# Patient Record
Sex: Male | Born: 1970 | Race: Black or African American | Hispanic: No | Marital: Single | Smoking: Never smoker
Health system: Southern US, Community
[De-identification: ages and names within clinical notes are randomized; demographics above are authoritative.]

## PROBLEM LIST (undated history)

## (undated) DIAGNOSIS — I10 Essential (primary) hypertension: Secondary | ICD-10-CM

## (undated) DIAGNOSIS — R7989 Other specified abnormal findings of blood chemistry: Secondary | ICD-10-CM

## (undated) DIAGNOSIS — H547 Unspecified visual loss: Secondary | ICD-10-CM

## (undated) DIAGNOSIS — J45909 Unspecified asthma, uncomplicated: Secondary | ICD-10-CM

## (undated) DIAGNOSIS — H548 Legal blindness, as defined in USA: Secondary | ICD-10-CM

## (undated) DIAGNOSIS — N289 Disorder of kidney and ureter, unspecified: Secondary | ICD-10-CM

## (undated) DIAGNOSIS — R778 Other specified abnormalities of plasma proteins: Secondary | ICD-10-CM

## (undated) DIAGNOSIS — E119 Type 2 diabetes mellitus without complications: Secondary | ICD-10-CM

## (undated) HISTORY — DX: Other specified abnormal findings of blood chemistry: R79.89

## (undated) HISTORY — DX: Legal blindness, as defined in USA: H54.8

## (undated) HISTORY — DX: Other specified abnormalities of plasma proteins: R77.8

---

## 2004-04-26 ENCOUNTER — Emergency Department (HOSPITAL_COMMUNITY): Admission: EM | Admit: 2004-04-26 | Discharge: 2004-04-26 | Payer: Self-pay | Admitting: Emergency Medicine

## 2004-05-17 ENCOUNTER — Ambulatory Visit: Payer: Self-pay | Admitting: Internal Medicine

## 2004-05-17 ENCOUNTER — Ambulatory Visit: Payer: Self-pay | Admitting: Family Medicine

## 2004-07-01 ENCOUNTER — Ambulatory Visit: Payer: Self-pay | Admitting: *Deleted

## 2005-08-01 ENCOUNTER — Ambulatory Visit: Payer: Self-pay | Admitting: Family Medicine

## 2005-08-08 ENCOUNTER — Ambulatory Visit: Payer: Self-pay | Admitting: Family Medicine

## 2006-03-27 ENCOUNTER — Ambulatory Visit: Payer: Self-pay | Admitting: Family Medicine

## 2006-09-05 ENCOUNTER — Ambulatory Visit: Payer: Self-pay | Admitting: *Deleted

## 2007-03-13 ENCOUNTER — Encounter (INDEPENDENT_AMBULATORY_CARE_PROVIDER_SITE_OTHER): Payer: Self-pay | Admitting: *Deleted

## 2007-03-15 ENCOUNTER — Encounter (INDEPENDENT_AMBULATORY_CARE_PROVIDER_SITE_OTHER): Payer: Self-pay | Admitting: Nurse Practitioner

## 2007-06-11 ENCOUNTER — Ambulatory Visit: Payer: Self-pay | Admitting: Family Medicine

## 2007-06-11 DIAGNOSIS — I110 Hypertensive heart disease with heart failure: Secondary | ICD-10-CM | POA: Insufficient documentation

## 2007-06-11 DIAGNOSIS — E119 Type 2 diabetes mellitus without complications: Secondary | ICD-10-CM | POA: Insufficient documentation

## 2007-06-11 LAB — CONVERTED CEMR LAB
ALT: 27 units/L (ref 0–53)
AST: 16 units/L (ref 0–37)
Albumin: 4.2 g/dL (ref 3.5–5.2)
Basophils Absolute: 0 10*3/uL (ref 0.0–0.1)
Basophils Relative: 0 % (ref 0–1)
Blood Glucose, Fingerstick: 419
Calcium: 9.1 mg/dL (ref 8.4–10.5)
Chloride: 98 meq/L (ref 96–112)
MCHC: 33.4 g/dL (ref 30.0–36.0)
Neutro Abs: 4.2 10*3/uL (ref 1.7–7.7)
Neutrophils Relative %: 61 % (ref 43–77)
Potassium: 4.5 meq/L (ref 3.5–5.3)
RBC: 5.25 M/uL (ref 4.22–5.81)
RDW: 12.7 % (ref 11.5–15.5)
TSH: 1.248 microintl units/mL (ref 0.350–5.50)
Total Protein: 7.4 g/dL (ref 6.0–8.3)

## 2007-06-14 DIAGNOSIS — E785 Hyperlipidemia, unspecified: Secondary | ICD-10-CM | POA: Insufficient documentation

## 2008-12-02 ENCOUNTER — Telehealth (INDEPENDENT_AMBULATORY_CARE_PROVIDER_SITE_OTHER): Payer: Self-pay | Admitting: Internal Medicine

## 2008-12-17 ENCOUNTER — Encounter (INDEPENDENT_AMBULATORY_CARE_PROVIDER_SITE_OTHER): Payer: Self-pay | Admitting: *Deleted

## 2014-09-19 ENCOUNTER — Encounter (HOSPITAL_COMMUNITY): Payer: Self-pay | Admitting: Emergency Medicine

## 2014-09-19 ENCOUNTER — Emergency Department (HOSPITAL_COMMUNITY)
Admission: EM | Admit: 2014-09-19 | Discharge: 2014-09-20 | Disposition: A | Discharge status: Home / Self Care | Payer: Self-pay | Attending: Emergency Medicine | Admitting: Emergency Medicine

## 2014-09-19 DIAGNOSIS — R739 Hyperglycemia, unspecified: Secondary | ICD-10-CM | POA: Insufficient documentation

## 2014-09-19 DIAGNOSIS — L02214 Cutaneous abscess of groin: Secondary | ICD-10-CM | POA: Insufficient documentation

## 2014-09-19 LAB — BASIC METABOLIC PANEL
ANION GAP: 9 (ref 5–15)
BUN: 9 mg/dL (ref 6–23)
CALCIUM: 9 mg/dL (ref 8.4–10.5)
CHLORIDE: 95 mmol/L — AB (ref 96–112)
CO2: 29 mmol/L (ref 19–32)
Creatinine, Ser: 1.38 mg/dL — ABNORMAL HIGH (ref 0.50–1.35)
GFR calc non Af Amer: 61 mL/min — ABNORMAL LOW (ref >90)
GFR, EST AFRICAN AMERICAN: 71 mL/min — AB (ref >90)
GLUCOSE: 412 mg/dL — AB (ref 70–99)
POTASSIUM: 4 mmol/L (ref 3.5–5.1)
SODIUM: 133 mmol/L — AB (ref 135–145)

## 2014-09-19 LAB — CBC WITH DIFFERENTIAL/PLATELET
Basophils Absolute: 0 10*3/uL (ref 0.0–0.1)
Basophils Relative: 0 % (ref 0–1)
Eosinophils Absolute: 0.1 10*3/uL (ref 0.0–0.7)
Eosinophils Relative: 1 % (ref 0–5)
HEMATOCRIT: 37 % — AB (ref 39.0–52.0)
HEMOGLOBIN: 13.4 g/dL (ref 13.0–17.0)
LYMPHS ABS: 2.3 10*3/uL (ref 0.7–4.0)
LYMPHS PCT: 26 % (ref 12–46)
MCH: 31.6 pg (ref 26.0–34.0)
MCHC: 36.2 g/dL — AB (ref 30.0–36.0)
MCV: 87.3 fL (ref 78.0–100.0)
MONOS PCT: 9 % (ref 3–12)
Monocytes Absolute: 0.8 10*3/uL (ref 0.1–1.0)
NEUTROS ABS: 5.7 10*3/uL (ref 1.7–7.7)
Neutrophils Relative %: 64 % (ref 43–77)
Platelets: 181 10*3/uL (ref 150–400)
RBC: 4.24 MIL/uL (ref 4.22–5.81)
RDW: 12.4 % (ref 11.5–15.5)
WBC: 8.9 10*3/uL (ref 4.0–10.5)

## 2014-09-19 MED ORDER — METFORMIN HCL 500 MG PO TABS
500.0000 mg | ORAL_TABLET | Freq: Once | ORAL | Status: AC
  Administered 2014-09-20: 500 mg via ORAL
  Filled 2014-09-19: qty 1

## 2014-09-19 MED ORDER — LIDOCAINE-EPINEPHRINE (PF) 2 %-1:200000 IJ SOLN
10.0000 mL | Freq: Once | INTRAMUSCULAR | Status: AC
  Administered 2014-09-20: 10 mL
  Filled 2014-09-19: qty 10

## 2014-09-19 MED ORDER — HYDROCODONE-ACETAMINOPHEN 5-325 MG PO TABS
2.0000 | ORAL_TABLET | Freq: Once | ORAL | Status: AC
Start: 2014-09-19 — End: 2014-09-19
  Administered 2014-09-19: 2 via ORAL
  Filled 2014-09-19: qty 2

## 2014-09-19 MED ORDER — SODIUM CHLORIDE 0.9 % IV BOLUS (SEPSIS)
1000.0000 mL | Freq: Once | INTRAVENOUS | Status: AC
  Administered 2014-09-20: 1000 mL via INTRAVENOUS

## 2014-09-19 NOTE — ED Notes (Signed)
Pt c/o left lower groin abscess. No hx abscesses. Denies fevers at home. Denies N/V/D. Denies drainage from sight. No other c/c.

## 2014-09-19 NOTE — ED Notes (Signed)
Pt reported having abscess to groin area.

## 2014-09-19 NOTE — ED Provider Notes (Signed)
CSN: [id number]     Arrival date & time 09/19/14  2224 History   First MD Initiated Contact with Patient 09/19/14 2240     Chief Complaint  Patient presents with  . Abscess     (Consider location/radiation/quality/duration/timing/severity/associated sxs/prior Treatment) HPI Comments: Patient presents today with a chief complaint of an abscess of the left groin area.  Abscess has been present for the past 4 days and is gradually increasing in size.  He reports that he has tried mashing the area, but has not noticed any drainage.  He denies fever, chills, nausea, or vomiting.  He denies prior history of Abscesses.  He denies history of DM.  However, review of the chart shows that the patient was diagnosed with DM in 2008 and started on Metformin at that time.  When patient was asked about this he states that he never took the Metformin and is currently not taking any medications for DM.  The history is provided by the patient.    History reviewed. No pertinent past medical history. History reviewed. No pertinent past surgical history. History reviewed. No pertinent family history. History  Substance Use Topics  . Smoking status: Never Smoker   . Smokeless tobacco: Not on file  . Alcohol Use: No    Review of Systems  All other systems reviewed and are negative.     Allergies  Review of patient's allergies indicates no known allergies.  Home Medications   Prior to Admission medications   Not on File   BP 161/88 mmHg  Pulse 109  Temp(Src) 100 F (37.8 C) (Oral)  Resp 17  SpO2 100% Physical Exam  Constitutional: He appears well-developed and well-nourished.  HENT:  Head: Normocephalic and atraumatic.  Mouth/Throat: Oropharynx is clear and moist.  Neck: Normal range of motion. Neck supple.  Cardiovascular: Normal rate, regular rhythm and normal heart sounds.   Pulmonary/Chest: Effort normal and breath sounds normal.  Abdominal: Soft. Bowel sounds are normal. He exhibits  no distension and no mass. There is no tenderness. There is no rebound and no guarding.  Genitourinary: Right testis shows no mass, no swelling and no tenderness. Left testis shows no mass, no swelling and no tenderness.  Musculoskeletal: Normal range of motion.  Neurological: He is alert.  Skin: Skin is warm and dry.     Psychiatric: He has a normal mood and affect.  Nursing note and vitals reviewed.   ED Course  Procedures (including critical care time) Labs Review Labs Reviewed - No data to display  Imaging Review No results found.   EKG Interpretation None     INCISION AND DRAINAGE Performed by: Hyman Bible Consent: Verbal consent obtained. Risks and benefits: risks, benefits and alternatives were discussed Type: abscess  Body area: left grion  Anesthesia: local infiltration  Incision was made with a scalpel.  Local anesthetic: lidocaine 2% with epinephrine  Anesthetic total: 3 ml  Complexity: complex Blunt dissection to break up loculations  Drainage: purulent  Drainage amount: large  Patient tolerance: Patient tolerated the procedure well with no immediate complications.    MDM   Final diagnoses:  None   Patient presents today with an abscess of the left groin area x 4 days.  Abscess incised and drained in the ED with good results.  He is afebrile in the ED.  Labs unremarkable aside from hyperglycemia.  Patient currently not on any DM medications.  Anion gap of 9.  Bicarb of 29.  Therefore, do not feel that the  patient is in DKA.  Patient given IVF and started on Metformin.  Patient also started on antibiotics.  Feel that he is stable for discharge.  Instructed to follow up in 2 days for recheck.  Return precautions given.    Hyman Bible, PA-C 09/20/14 0133  Evelina Bucy, MD 09/21/14 [phone]

## 2014-09-20 MED ORDER — SULFAMETHOXAZOLE-TRIMETHOPRIM 800-160 MG PO TABS
2.0000 | ORAL_TABLET | Freq: Two times a day (BID) | ORAL | Status: DC
Start: 2014-09-20 — End: 2016-01-04

## 2014-09-20 MED ORDER — METFORMIN HCL 500 MG PO TABS: 500.0000 mg | ORAL_TABLET | Freq: Two times a day (BID) | ORAL | Status: DC

## 2014-09-20 NOTE — ED Notes (Signed)
Applied a dry dsg to groin area. Pt tolerated well.

## 2014-09-20 NOTE — Discharge Instructions (Signed)
Your blood work today showed diabetes.  It is very important for you to take the Metformin and to eat a diet low in sugar and carbohydrates.  It is also important for you to follow up with the Primary Care Physician listed above at [facility] or see resource guide below.  It is also recommended to follow up with a Primary Care Physician or Urgent Care in 2 days to have the abscess rechecked.     Emergency Department Resource Guide 1) Find a Doctor and Pay Out of Pocket Although you won't have to find out who is covered by your insurance plan, it is a good idea to ask around and get recommendations. You will then need to call the office and see if the doctor you have chosen will accept you as a new patient and what types of options they offer for patients who are self-pay. Some doctors offer discounts or will set up payment plans for their patients who do not have insurance, but you will need to ask so you aren't surprised when you get to your appointment.  2) Contact Your Local Health Department Not all health departments have doctors that can see patients for sick visits, but many do, so it is worth a call to see if yours does. If you don't know where your local health department is, you can check in your phone book. The CDC also has a tool to help you locate your state's health department, and many state websites also have listings of all of their local health departments.  3) Find a [facility] If your illness is not likely to be very severe or complicated, you may want to try a walk in clinic. These are popping up all over the country in pharmacies, drugstores, and shopping centers. They're usually staffed by nurse practitioners or physician assistants that have been trained to treat common illnesses and complaints. They're usually fairly quick and inexpensive. However, if you have serious medical issues or chronic medical problems, these are probably not your best option.  No  Primary Care Doctor: - Call Health Connect at  [phone] - they can help you locate a primary care doctor that  accepts your insurance, provides certain services, etc. - Physician Referral Service- [phone]  Chronic Pain Problems: Organization         Address  Phone   Notes  [facility]  [phone] Patients need to be referred by their primary care doctor.   Medication Assistance: Organization         Address  Phone   Notes  [facility] Medication [facility] [address] [place], [place] [address] [phone] --Must be a resident of [facility] -- Must have NO insurance coverage whatsoever (no Medicaid/ Medicare, etc.) -- The pt. MUST have a primary care doctor that directs their care regularly and follows them in the community   MedAssist  [phone]   [employer]  [phone]    Agencies that provide inexpensive medical care: Organization         Address  Phone   Notes  [place]  [phone]   [facility]    [phone]   [facility] [place], [place] [phone]   [facility] [address], [place] [phone]   Planned Parenthood    [phone]   [facility]    [phone]   [address], [place] Phone:  [phone], Fax:  [phone] Hours of Operation:  9 am - 6 pm, M-F.  Also accepts Medicaid/Medicare and self-pay.  [facility] for [place], [address], [place] Phone: [phone], Fax: [phone]. Hours of Operation:  8:30 am - 5:30 pm, M-F.  Also accepts Medicaid and self-pay.  [facility] [address], [place] Phone: [phone]   [place], [place], [place] [phone], Ext. [phone] Mondays & Thursdays: 7-9 AM.  First 15 patients are seen on  a first come, first serve basis.    [place] Providers:  Organization         Address  Phone   Notes  [facility] [address], [place] [phone] Also accepts self-pay patients.  [facility] [id number] [place], [place]  [phone]   [place], [address], [place] [phone]   [facility] [address] [place] [phone]   Lucianne Lei [address], [place]   [phone] Only accepts [place] Access [place] patients after they have their name applied to their card.   Self-Pay (no insurance) in [facility]:  Organization         Address  Phone   Notes  Sickle Cell Patients, [facility] [place] [phone]   [facility] [place] [phone]   [facility] [place]  [place], [place], [place] [phone]   [facility]/Dr. Osei-Bonsu  [address] [place] or [address], [place] [phone] Phone number for both [place] and [place] locations is the same.  Urgent Medical and [facility] [address] [place] [phone]   [facility] [address], [place] or [address] [phone]   [facility] [address] [place] [phone], phone; [phone], fax Sees patients 1st and 3rd Saturday of every month.  Must not qualify for public or private insurance (i.e. Medicaid, Medicare, [place] Health Choice, Veterans' Benefits)  Household income should be no more than 200% of the poverty level The clinic cannot treat you if you are pregnant or think you are pregnant  Sexually transmitted diseases are not treated at the clinic.    Dental Care: Organization          Address  Phone  Notes  [facility] [place] [phone] Accepts children up to age 77 who are enrolled in [place] or [place]; pregnant women with a Medicaid card; and children who have applied for Medicaid or [place] Health Choice, but were declined, whose parents can pay a reduced fee at time of service.  [facility] Department of [facility]  [address], [place] [phone] Accepts children up to age 73 who are enrolled in [place] or [place]; pregnant women with a Medicaid card; and children who have applied for Medicaid or [place] Health Choice, but were declined, whose parents can pay a reduced fee at time of service.  [place]  Access PROGRAM  [place] [phone] Patients are seen by appointment only. Walk-ins are not accepted. [place] will see patients 51 years of age and older. Monday - Tuesday (8am-5pm) Most Wednesdays (8:30-5pm) $30 per visit, cash only  [facility] Adult Dental Access PROGRAM  [address], [facility] [phone] Patients are seen by appointment only. Walk-ins are not accepted. [place] will see patients 67 years of age and older. One Wednesday Evening (Monthly: Volunteer Based).  $30 per visit, cash only  [place]  [phone] for adults; Children under age 59, call Graduate Pediatric Dentistry at [phone]. Children aged 43-14, please call [phone] to request a pediatric application.  Dental services are provided in all areas of dental care including fillings, crowns and bridges, complete and partial dentures, implants, gum treatment, root canals, and extractions. Preventive care is also provided. Treatment is provided to both adults and children. Patients are selected via a lottery and there is often a waiting list.   [facility] [address] [place]  [phone] [email or web address]   [facility] [address] [place], [place] [phone], Ext. [phone] Second and Fourth Thursday of each month, opens at 6:30 AM; Clinic ends at 9 AM.  Patients are seen on a first-come first-served basis, and a limited number are seen during each clinic.   [facility]  [address] Hillard Danker [place], [place] [phone]   Eligibility Requirements You must have lived in [place], [place], or [place] counties for at least the last three months.   You cannot be eligible for state or federal sponsored [employer], including [employer], [place], or [employer].   You generally cannot be eligible for healthcare insurance through your employer.    How to apply: Eligibility screenings are held every Tuesday and Wednesday afternoon from 1:00 pm until 4:00 pm. You do not need an appointment for the interview!  [facility] [address], [place], [place]   [place]  [place] Department  [place]  [phone]    Behavioral Health Resources in the Community: Intensive Outpatient Programs Organization         Address  Phone  Notes  [place]. [address], [place], [place] [phone]   [facility]patient [address] [place], [place]   ADS: Alcohol & Drug Svcs [address] [place], [place]   [place] [address],  [place], [place] or [phone]   Substance Abuse Resources Organization         Address  Phone  Notes  Alcohol and Drug Services  [phone]   [place]  [phone]   [facility] [place]   Chinita Pester  [phone]   Residential & Outpatient Substance Abuse Program  [phone]   Psychological  Services Organization         Address  Phone  Notes  [facility] [place]  [place]  [phone]   [place] [address] [place] or [phone]    Mobile Crisis Teams Organization         Address  Phone  Notes  [facility], Mobile Crisis Care Unit  [phone]   [facility]  [address] Lady [place],  [place] [address], [place] [phone]    Self-Help/Support Groups Organization         Address  Phone             Notes  [facility] of [place] - variety of support groups  [place] Call for more information  Narcotics Anonymous (NA), [facility] [address], [employer] [place]  2 meetings at this location   Special educational needs teacher         Address  Phone  Notes  [facility] Residential Treatment [place],    [place]  [phone]   [facility]  [address], [place] [phone], [place], [place]   [place], [place] [phone] Admissions: 8am-3pm M-F  Incentives Substance [place] [address],    [place], [place] [phone]   [facility] [address] [place], [place], [place]   [facility] [address],  [place], [place]   [facility] Programs - Intensive Outpatient [address] Kristeen Mans [address], [place], [place]   [facility] [address].,  [place], [place] [phone] or [phone]   [facility] Services (RTS) [address], [place], [place] Accepts Medicaid  [facility] [place] [address]  [place] [phone] Substance Abuse/Addiction Treatment   [facility] Organization         Address  Phone  Notes  [facility]  [phone]   Domenic Schwab, PhD [address] Arlis Porta [place], [place]   [phone] or [phone]   [facility], [place] [phone]   [facility] [address], [place], [place] [phone] Insurance/Medicaid/sponsorship through [facility] and Families [address] [place]                                    [place], [place] [phone] [place] [address] [place] [address] [place], [place] [phone]    Dr. Adele Schilder  [phone]   [facility] Dept. 1) [address] [place] 2) [place] 3)  [place] [address], [place] [phone]  [phone]   [place] [phone] or [phone] (After Hours)

## 2014-09-20 NOTE — ED Notes (Signed)
PA at bedside to I&D abscess to groin area. Pt tolerated well.

## 2014-09-20 NOTE — ED Notes (Signed)
Reapplied dry dsg. Noted moderate amt of bleeding from site. Pt tolerated well.

## 2014-09-20 NOTE — ED Notes (Signed)
Awake. Verbally responsive. A/O x4. Resp even and unlabored. No audible adventitious breath sounds noted. ABC's intact.  

## 2016-01-04 ENCOUNTER — Encounter (HOSPITAL_COMMUNITY): Payer: Self-pay | Admitting: Family Medicine

## 2016-01-04 ENCOUNTER — Ambulatory Visit (HOSPITAL_COMMUNITY)
Admission: EM | Admit: 2016-01-04 | Discharge: 2016-01-04 | Disposition: A | Discharge status: Home / Self Care | Payer: 59 | Attending: Family Medicine | Admitting: Family Medicine

## 2016-01-04 DIAGNOSIS — J9801 Acute bronchospasm: Secondary | ICD-10-CM

## 2016-01-04 DIAGNOSIS — I10 Essential (primary) hypertension: Secondary | ICD-10-CM

## 2016-01-04 MED ORDER — PREDNISONE 20 MG PO TABS: ORAL_TABLET | ORAL | Status: DC

## 2016-01-04 MED ORDER — ALBUTEROL SULFATE HFA 108 (90 BASE) MCG/ACT IN AERS: 2.0000 | INHALATION_SPRAY | RESPIRATORY_TRACT | Status: DC | PRN

## 2016-01-04 NOTE — ED Notes (Signed)
Pt here for cough, wheezing x 4 days. sts some phlegm. Pt hypertensive(checked twice) at 181/110. sts hasn't been taking BP meds. No acute distress. sats 92%

## 2016-01-04 NOTE — Discharge Instructions (Signed)
Bronchospasm, Adult A bronchospasm is a spasm or tightening of the airways going into the lungs. During a bronchospasm breathing becomes more difficult because the airways get smaller. When this happens there can be coughing, a whistling sound when breathing (wheezing), and difficulty breathing. Bronchospasm is often associated with asthma, but not all patients who experience a bronchospasm have asthma. CAUSES  A bronchospasm is caused by inflammation or irritation of the airways. The inflammation or irritation may be triggered by:   Allergies (such as to animals, pollen, food, or mold). Allergens that cause bronchospasm may cause wheezing immediately after exposure or many hours later.   Infection. Viral infections are believed to be the most common cause of bronchospasm.   Exercise.   Irritants (such as pollution, cigarette smoke, strong odors, aerosol sprays, and paint fumes).   Weather changes. Winds increase molds and pollens in the air. Rain refreshes the air by washing irritants out. Cold air may cause inflammation.   Stress and emotional upset.  SIGNS AND SYMPTOMS   Wheezing.   Excessive nighttime coughing.   Frequent or severe coughing with a simple cold.   Chest tightness.   Shortness of breath.  DIAGNOSIS  Bronchospasm is usually diagnosed through a history and physical exam. Tests, such as chest X-rays, are sometimes done to look for other conditions. TREATMENT   Inhaled medicines can be given to open up your airways and help you breathe. The medicines can be given using either an inhaler or a nebulizer machine.  Corticosteroid medicines may be given for severe bronchospasm, usually when it is associated with asthma. HOME CARE INSTRUCTIONS   Always have a plan prepared for seeking medical care. Know when to call your health care provider and local emergency services (911 in the [place] Know where you can access local emergency care.  Only take medicines as  directed by your health care provider.  If you were prescribed an inhaler or nebulizer machine, ask your health care provider to explain how to use it correctly. Always use a spacer with your inhaler if you were given one.  It is necessary to remain calm during an attack. Try to relax and breathe more slowly.  Control your home environment in the following ways:   Change your heating and air conditioning filter at least once a month.   Limit your use of fireplaces and wood stoves.  Do not smoke and do not allow smoking in your home.   Avoid exposure to perfumes and fragrances.   Get rid of pests (such as roaches and mice) and their droppings.   Throw away plants if you see mold on them.   Keep your house clean and dust free.   Replace carpet with wood, tile, or vinyl flooring. Carpet can trap dander and dust.   Use allergy-proof pillows, mattress covers, and box spring covers.   Wash bed sheets and blankets every week in hot water and dry them in a dryer.   Use blankets that are made of polyester or cotton.   Wash hands frequently. SEEK MEDICAL CARE IF:   You have muscle aches.   You have chest pain.   The sputum changes from clear or white to yellow, green, gray, or bloody.   The sputum you cough up gets thicker.   There are problems that may be related to the medicine you are given, such as a rash, itching, swelling, or trouble breathing.  SEEK IMMEDIATE MEDICAL CARE IF:   You have worsening wheezing and coughing  even after taking your prescribed medicines.   You have increased difficulty breathing.   You develop severe chest pain. MAKE SURE YOU:   Understand these instructions.  Will watch your condition.  Will get help right away if you are not doing well or get worse.   This information is not intended to replace advice given to you by your health care provider. Make sure you discuss any questions you have with your health care  provider.   Document Released: 06/15/2003 Document Revised: 07/03/2014 Document Reviewed: 12/02/2012 Elsevier Interactive Patient Education 2016 [employer].  How to Use an Inhaler Using your inhaler correctly is very important. Good technique will make sure that the medicine reaches your lungs.  HOW TO USE AN INHALER:  Take the cap off the inhaler.  If this is the first time using your inhaler, you need to prime it. Shake the inhaler for 5 seconds. Release four puffs into the air, away from your face. Ask your doctor for help if you have questions.  Shake the inhaler for 5 seconds.  Turn the inhaler so the bottle is above the mouthpiece.  Put your pointer finger on top of the bottle. Your thumb holds the bottom of the inhaler.  Open your mouth.  Either hold the inhaler away from your mouth (the width of 2 fingers) or place your lips tightly around the mouthpiece. Ask your doctor which way to use your inhaler.  Breathe out as much air as possible.  Breathe in and push down on the bottle 1 time to release the medicine. You will feel the medicine go in your mouth and throat.  Continue to take a deep breath in very slowly. Try to fill your lungs.  After you have breathed in completely, hold your breath for 10 seconds. This will help the medicine to settle in your lungs. If you cannot hold your breath for 10 seconds, hold it for as long as you can before you breathe out.  Breathe out slowly, through pursed lips. Whistling is an example of pursed lips.  If your doctor has told you to take more than 1 puff, wait at least 15-30 seconds between puffs. This will help you get the best results from your medicine. Do not use the inhaler more than your doctor tells you to.  Put the cap back on the inhaler.  Follow the directions from your doctor or from the inhaler package about cleaning the inhaler. If you use more than one inhaler, ask your doctor which inhalers to use and what order to  use them in. Ask your doctor to help you figure out when you will need to refill your inhaler.  If you use a steroid inhaler, always rinse your mouth with water after your last puff, gargle and spit out the water. Do not swallow the water. GET HELP IF:  The inhaler medicine only partially helps to stop wheezing or shortness of breath.  You are having trouble using your inhaler.  You have some increase in thick spit (phlegm). GET HELP RIGHT AWAY IF:  The inhaler medicine does not help your wheezing or shortness of breath or you have tightness in your chest.  You have dizziness, headaches, or fast heart rate.  You have chills, fever, or night sweats.  You have a large increase of thick spit, or your thick spit is bloody. MAKE SURE YOU:   Understand these instructions.  Will watch your condition.  Will get help right away if you are not  doing well or get worse.   This information is not intended to replace advice given to you by your health care provider. Make sure you discuss any questions you have with your health care provider.   Document Released: 03/21/2008 Document Revised: 04/02/2013 Document Reviewed: 01/09/2013 Elsevier Interactive Patient Education 2016 [employer].  Hypertension You must have your hypertension under control as soon as possible. Restart her medications and have them. You need to see a primary care doctor sent is possible. Hypertension is another name for high blood pressure. High blood pressure forces your heart to work harder to pump blood. A blood pressure reading has two numbers, which includes a higher number over a lower number (example: 110/72). HOME CARE   Have your blood pressure rechecked by your doctor.  Only take medicine as told by your doctor. Follow the directions carefully. The medicine does not work as well if you skip doses. Skipping doses also puts you at risk for problems.  Do not smoke.  Monitor your blood pressure at home as  told by your doctor. GET HELP IF:  You think you are having a reaction to the medicine you are taking.  You have repeat headaches or feel dizzy.  You have puffiness (swelling) in your ankles.  You have trouble with your vision. GET HELP RIGHT AWAY IF:   You get a very bad headache and are confused.  You feel weak, numb, or faint.  You get chest or belly (abdominal) pain.  You throw up (vomit).  You cannot breathe very well. MAKE SURE YOU:   Understand these instructions.  Will watch your condition.  Will get help right away if you are not doing well or get worse.   This information is not intended to replace advice given to you by your health care provider. Make sure you discuss any questions you have with your health care provider.   Document Released: 11/29/2007 Document Revised: 06/17/2013 Document Reviewed: 04/04/2013 Elsevier Interactive Patient Education 2016 [place] Your High Blood Pressure Blood pressure is a measurement of how forceful your blood is pressing against the walls of the arteries. Arteries are muscular tubes within the circulatory system. Blood pressure does not stay the same. Blood pressure rises when you are active, excited, or nervous; and it lowers during sleep and relaxation. If the numbers measuring your blood pressure stay above normal most of the time, you are at risk for health problems. High blood pressure (hypertension) is a long-term (chronic) condition in which blood pressure is elevated. A blood pressure reading is recorded as two numbers, such as 120 over 80 (or 120/80). The first, higher number is called the systolic pressure. It is a measure of the pressure in your arteries as the heart beats. The second, lower number is called the diastolic pressure. It is a measure of the pressure in your arteries as the heart relaxes between beats.  Keeping your blood pressure in a normal range is important to your overall health and  prevention of health problems, such as heart disease and stroke. When your blood pressure is uncontrolled, your heart has to work harder than normal. High blood pressure is a very common condition in adults because blood pressure tends to rise with age. Men and women are equally likely to have hypertension but at different times in life. Before age 7, men are more likely to have hypertension. After 45 years of age, women are more likely to have it. Hypertension is especially common in  African Americans. This condition often has no signs or symptoms. The cause of the condition is usually not known. Your caregiver can help you come up with a plan to keep your blood pressure in a normal, healthy range. BLOOD PRESSURE STAGES Blood pressure is classified into four stages: normal, prehypertension, stage 1, and stage 2. Your blood pressure reading will be used to determine what type of treatment, if any, is necessary. Appropriate treatment options are tied to these four stages:  Normal  Systolic pressure (mm Hg): below 120.  Diastolic pressure (mm Hg): below 80. Prehypertension  Systolic pressure (mm Hg): 120 to 139.  Diastolic pressure (mm Hg): 80 to 89. Stage1  Systolic pressure (mm Hg): 140 to 159.  Diastolic pressure (mm Hg): 90 to 99. Stage2  Systolic pressure (mm Hg): 160 or above.  Diastolic pressure (mm Hg): 100 or above. RISKS RELATED TO HIGH BLOOD PRESSURE Managing your blood pressure is an important responsibility. Uncontrolled high blood pressure can lead to:  A heart attack.  A stroke.  A weakened blood vessel (aneurysm).  Heart failure.  Kidney damage.  Eye damage.  Metabolic syndrome.  Memory and concentration problems. HOW TO MANAGE YOUR BLOOD PRESSURE Blood pressure can be managed effectively with lifestyle changes and medicines (if needed). Your caregiver will help you come up with a plan to bring your blood pressure within a normal range. Your plan should  include the following: Education  Read all information provided by your caregivers about how to control blood pressure.  Educate yourself on the latest guidelines and treatment recommendations. New research is always being done to further define the risks and treatments for high blood pressure. Lifestylechanges  Control your weight.  Avoid smoking.  Stay physically active.  Reduce the amount of salt in your diet.  Reduce stress.  Control any chronic conditions, such as high cholesterol or diabetes.  Reduce your alcohol intake. Medicines  Several medicines (antihypertensive medicines) are available, if needed, to bring blood pressure within a normal range. Communication  Review all the medicines you take with your caregiver because there may be side effects or interactions.  Talk with your caregiver about your diet, exercise habits, and other lifestyle factors that may be contributing to high blood pressure.  See your caregiver regularly. Your caregiver can help you create and adjust your plan for managing high blood pressure. RECOMMENDATIONS FOR TREATMENT AND FOLLOW-UP  The following recommendations are based on current guidelines for managing high blood pressure in nonpregnant adults. Use these recommendations to identify the proper follow-up period or treatment option based on your blood pressure reading. You can discuss these options with your caregiver.  Systolic pressure of 123456 to [id number] or diastolic pressure of 80 to 89: Follow up with your caregiver as directed.  Systolic pressure of [id number] to 0000000 or diastolic pressure of 90 to 100: Follow up with your caregiver within 2 months.  Systolic pressure above 0000000 or diastolic pressure above [id number]: Follow up with your caregiver within 1 month.  Systolic pressure above [id number] or diastolic pressure above [id number]: Consider antihypertensive therapy; follow up with your caregiver within 1 week.  Systolic pressure above [id number] or diastolic  pressure above 123456: Begin antihypertensive therapy; follow up with your caregiver within 1 week.   This information is not intended to replace advice given to you by your health care provider. Make sure you discuss any questions you have with your health care provider.   Document Released: 03/06/2012 Document Reviewed: 03/06/2012 Elsevier Interactive Patient  Education ©2016 Elsevier Inc[employer] ° °

## 2016-01-04 NOTE — ED Provider Notes (Signed)
CSN: [id number]     Arrival date & time 01/04/16  1225 History   First MD Initiated Contact with Patient 01/04/16 1328     Chief Complaint  Patient presents with  . Cough  . Wheezing   (Consider location/radiation/quality/duration/timing/severity/associated sxs/prior Treatment) HPI Comments: 45 year old male states that he is having a cough and wheeze primarily at nighttime. He rarely has symptoms during the day. Started approximate 4 days ago. He states that it is better when per home however when lying on his side or his back he developed some cough and a sensation of wheezing. He was diagnosed with asthma as a child but as he is going to adult he never had any problems. He has never been on vacation. He states this is a recent and new development. He also has a history of hypertension and is currently not taking his medication. He denies any type of chest pain, pressure, heaviness, tightness or fullness. Denies exertional symptoms.   History reviewed. No pertinent past medical history. History reviewed. No pertinent past surgical history. History reviewed. No pertinent family history. Social History  Substance Use Topics  . Smoking status: Never Smoker   . Smokeless tobacco: None  . Alcohol Use: No    Review of Systems  Constitutional: Negative for fever, activity change and fatigue.  HENT: Negative.   Respiratory: Positive for cough and wheezing. Negative for shortness of breath.   Cardiovascular: Negative for chest pain, palpitations and leg swelling.  Musculoskeletal: Negative.   Skin: Negative.   Neurological: Negative.   All other systems reviewed and are negative.   Allergies  Review of patient's allergies indicates no known allergies.  Home Medications   Prior to Admission medications   Medication Sig Start Date End Date Taking? Authorizing Provider  albuterol (PROVENTIL HFA;VENTOLIN HFA) 108 (90 Base) MCG/ACT inhaler Inhale 2 puffs into the lungs every 4 (four)  hours as needed for wheezing or shortness of breath. 01/04/16   Janne Napoleon, NP  predniSONE (DELTASONE) 20 MG tablet Take 3 tabs po on first day, 2 tabs second day, 2 tabs third day, 1 tab fourth day, 1 tab 5th day. Take with food. 01/04/16   Janne Napoleon, NP   Meds Ordered and Administered this Visit  Medications - No data to display  BP 181/110 mmHg  Pulse 102  Temp(Src) 98.8 F (37.1 C)  Resp 18  SpO2 92% No data found.   Physical Exam  Constitutional: He is oriented to person, place, and time. He appears well-developed and well-nourished. No distress.  HENT:  Mouth/Throat: Oropharynx is clear and moist. No oropharyngeal exudate.  Eyes: Conjunctivae and EOM are normal.  Neck: Normal range of motion. Neck supple.  Cardiovascular: Normal rate, regular rhythm and normal heart sounds.   Pulmonary/Chest: Effort normal. No respiratory distress. He has no rales. He exhibits no tenderness.  Tidal volume reveals no wheezing. Having the patient cough produces diffuse bilateral coarseness. Forced expiration produces mildly prolonged expiratory phase. No other abnormal or adventitious sounds.  Musculoskeletal: He exhibits no edema.  Lymphadenopathy:    He has no cervical adenopathy.  Neurological: He is alert and oriented to person, place, and time. He exhibits normal muscle tone.  Skin: Skin is warm and dry.  Psychiatric: He has a normal mood and affect.  Nursing note and vitals reviewed.   ED Course  Procedures (including critical care time)  Labs Review Labs Reviewed - No data to display  Imaging Review No results found.   Visual Acuity  Review  Right Eye Distance:   Left Eye Distance:   Bilateral Distance:    Right Eye Near:   Left Eye Near:    Bilateral Near:         MDM   1. Bronchospasm   2. Essential hypertension    You must have your hypertension under control as soon as possible. Restart your medications if you have them. You need to see a primary care doctor  sent is possible. Meds ordered this encounter  Medications  . albuterol (PROVENTIL HFA;VENTOLIN HFA) 108 (90 Base) MCG/ACT inhaler    Sig: Inhale 2 puffs into the lungs every 4 (four) hours as needed for wheezing or shortness of breath.    Dispense:  1 Inhaler    Refill:  0    Order Specific Question:  Supervising Provider    Answer:  Ihor Gully D [id number]  . predniSONE (DELTASONE) 20 MG tablet    Sig: Take 3 tabs po on first day, 2 tabs second day, 2 tabs third day, 1 tab fourth day, 1 tab 5th day. Take with food.    Dispense:  9 tablet    Refill:  0    Order Specific Question:  Supervising Provider    Answer:  Ihor Gully D [id number]  Taken any histamines such as Allegra, Claritin or Zyrtec daily.     Janne Napoleon, NP 01/04/16 1355

## 2016-12-11 ENCOUNTER — Emergency Department (HOSPITAL_COMMUNITY): Payer: Self-pay

## 2016-12-11 ENCOUNTER — Encounter (HOSPITAL_COMMUNITY): Payer: Self-pay | Admitting: Emergency Medicine

## 2016-12-11 ENCOUNTER — Inpatient Hospital Stay (HOSPITAL_COMMUNITY)
Admission: EM | Admit: 2016-12-11 | Discharge: 2016-12-14 | DRG: 305 | Disposition: A | Discharge status: Home / Self Care | Payer: 59 | Attending: Internal Medicine | Admitting: Internal Medicine

## 2016-12-11 DIAGNOSIS — Z79899 Other long term (current) drug therapy: Secondary | ICD-10-CM

## 2016-12-11 DIAGNOSIS — I160 Hypertensive urgency: Secondary | ICD-10-CM | POA: Diagnosis not present

## 2016-12-11 DIAGNOSIS — I10 Essential (primary) hypertension: Secondary | ICD-10-CM | POA: Diagnosis not present

## 2016-12-11 DIAGNOSIS — N19 Unspecified kidney failure: Secondary | ICD-10-CM

## 2016-12-11 DIAGNOSIS — N183 Chronic kidney disease, stage 3 (moderate): Secondary | ICD-10-CM | POA: Diagnosis present

## 2016-12-11 DIAGNOSIS — I161 Hypertensive emergency: Principal | ICD-10-CM | POA: Diagnosis present

## 2016-12-11 DIAGNOSIS — R609 Edema, unspecified: Secondary | ICD-10-CM

## 2016-12-11 DIAGNOSIS — E1129 Type 2 diabetes mellitus with other diabetic kidney complication: Secondary | ICD-10-CM | POA: Diagnosis not present

## 2016-12-11 DIAGNOSIS — I248 Other forms of acute ischemic heart disease: Secondary | ICD-10-CM | POA: Diagnosis present

## 2016-12-11 DIAGNOSIS — N179 Acute kidney failure, unspecified: Secondary | ICD-10-CM

## 2016-12-11 DIAGNOSIS — E8770 Fluid overload, unspecified: Secondary | ICD-10-CM

## 2016-12-11 DIAGNOSIS — E8779 Other fluid overload: Secondary | ICD-10-CM | POA: Diagnosis not present

## 2016-12-11 DIAGNOSIS — E785 Hyperlipidemia, unspecified: Secondary | ICD-10-CM | POA: Diagnosis present

## 2016-12-11 DIAGNOSIS — I129 Hypertensive chronic kidney disease with stage 1 through stage 4 chronic kidney disease, or unspecified chronic kidney disease: Secondary | ICD-10-CM | POA: Diagnosis present

## 2016-12-11 DIAGNOSIS — E1122 Type 2 diabetes mellitus with diabetic chronic kidney disease: Secondary | ICD-10-CM | POA: Diagnosis present

## 2016-12-11 DIAGNOSIS — E119 Type 2 diabetes mellitus without complications: Secondary | ICD-10-CM

## 2016-12-11 DIAGNOSIS — R809 Proteinuria, unspecified: Secondary | ICD-10-CM | POA: Diagnosis not present

## 2016-12-11 HISTORY — DX: Essential (primary) hypertension: I10

## 2016-12-11 HISTORY — DX: Type 2 diabetes mellitus without complications: E11.9

## 2016-12-11 HISTORY — DX: Hypertensive urgency: I16.0

## 2016-12-11 HISTORY — DX: Fluid overload, unspecified: E87.70

## 2016-12-11 HISTORY — DX: Acute kidney failure, unspecified: N17.9

## 2016-12-11 LAB — BRAIN NATRIURETIC PEPTIDE: B Natriuretic Peptide: 261.6 pg/mL — ABNORMAL HIGH (ref 0.0–100.0)

## 2016-12-11 LAB — URINALYSIS, MICROSCOPIC (REFLEX): SQUAMOUS EPITHELIAL / LPF: NONE SEEN

## 2016-12-11 LAB — BASIC METABOLIC PANEL
Anion gap: 8 (ref 5–15)
BUN: 22 mg/dL — ABNORMAL HIGH (ref 6–20)
CHLORIDE: 106 mmol/L (ref 101–111)
CO2: 25 mmol/L (ref 22–32)
Calcium: 8.8 mg/dL — ABNORMAL LOW (ref 8.9–10.3)
Creatinine, Ser: 2.25 mg/dL — ABNORMAL HIGH (ref 0.61–1.24)
GFR calc Af Amer: 39 mL/min — ABNORMAL LOW (ref >60)
GFR calc non Af Amer: 33 mL/min — ABNORMAL LOW (ref >60)
Glucose, Bld: 110 mg/dL — ABNORMAL HIGH (ref 65–99)
Potassium: 3.7 mmol/L (ref 3.5–5.1)
Sodium: 139 mmol/L (ref 135–145)

## 2016-12-11 LAB — CBC WITH DIFFERENTIAL/PLATELET
Basophils Absolute: 0 10*3/uL (ref 0.0–0.1)
Basophils Relative: 0 %
Eosinophils Absolute: 0.2 10*3/uL (ref 0.0–0.7)
Eosinophils Relative: 2 %
HCT: 32.9 % — ABNORMAL LOW (ref 39.0–52.0)
Hemoglobin: 11.4 g/dL — ABNORMAL LOW (ref 13.0–17.0)
LYMPHS ABS: 1.8 10*3/uL (ref 0.7–4.0)
Lymphocytes Relative: 25 %
MCH: 29.6 pg (ref 26.0–34.0)
MCHC: 34.7 g/dL (ref 30.0–36.0)
MCV: 85.5 fL (ref 78.0–100.0)
MONO ABS: 0.7 10*3/uL (ref 0.1–1.0)
Monocytes Relative: 9 %
NEUTROS ABS: 4.5 10*3/uL (ref 1.7–7.7)
Neutrophils Relative %: 64 %
PLATELETS: 245 10*3/uL (ref 150–400)
RBC: 3.85 MIL/uL — AB (ref 4.22–5.81)
RDW: 13.2 % (ref 11.5–15.5)
WBC: 7.1 10*3/uL (ref 4.0–10.5)

## 2016-12-11 LAB — URINALYSIS, ROUTINE W REFLEX MICROSCOPIC
BILIRUBIN URINE: NEGATIVE
Glucose, UA: NEGATIVE mg/dL
Ketones, ur: NEGATIVE mg/dL
Leukocytes, UA: NEGATIVE
NITRITE: NEGATIVE
PH: 6 (ref 5.0–8.0)
Protein, ur: >300 mg/dL — AB
SPECIFIC GRAVITY, URINE: 1.008 (ref 1.005–1.030)

## 2016-12-11 LAB — RAPID URINE DRUG SCREEN, HOSP PERFORMED
AMPHETAMINES: NOT DETECTED
Barbiturates: NOT DETECTED
Benzodiazepines: NOT DETECTED
Cocaine: NOT DETECTED
OPIATES: NOT DETECTED
Tetrahydrocannabinol: POSITIVE — AB

## 2016-12-11 LAB — I-STAT TROPONIN, ED: Troponin i, poc: 0.05 ng/mL (ref 0.00–0.08)

## 2016-12-11 LAB — TROPONIN I: TROPONIN I: 0.06 ng/mL — AB (ref <0.03)

## 2016-12-11 MED ORDER — AMLODIPINE BESYLATE 5 MG PO TABS
5.0000 mg | ORAL_TABLET | Freq: Once | ORAL | Status: AC
  Administered 2016-12-11: 5 mg via ORAL
  Filled 2016-12-11: qty 1

## 2016-12-11 MED ORDER — LABETALOL HCL 5 MG/ML IV SOLN
10.0000 mg | Freq: Once | INTRAVENOUS | Status: DC
  Administered 2016-12-11: 10 mg via INTRAVENOUS
  Filled 2016-12-11: qty 4

## 2016-12-11 NOTE — ED Provider Notes (Signed)
Emergency Department Provider Note   I have reviewed the triage vital signs and the nursing notes.   HISTORY  Chief Complaint Leg Swelling   HPI Corey Park is a 46 y.o. male with PMH of HLD, HTN, and DM with no PCP and non-compliant with medication presents to the emergency department for evaluation of worsening bilateral lower extremity swelling and some fatigue symptoms. Patient has not taken any blood pressure or "fluid pills" in the last several months. He states he was prescribed this in the past but never filled the prescription because the symptoms seemed to get better. He started working recently has been standing a lot and feels that his lower extremity swelling is worse. Having swelling on both sides the left slightly greater than the right. Denies any dyspnea or chest pain. No headaches, vision changes, weakness/numbness. No fevers or chills. No exertional symptoms or pain. Reports trying to establish care with PCP at this time.    Past Medical History:  Diagnosis Date  . Diabetes mellitus without complication ([place])   . Hypertension     Patient Active Problem List   Diagnosis Date Noted  . Hypertensive urgency 12/11/2016  . AKI (acute kidney injury) ([place]) 12/11/2016  . Fluid overload 12/11/2016  . HLD (hyperlipidemia) 06/14/2007  . DM (diabetes mellitus), type 2 ([place]) 06/11/2007  . HYPERTENSION 06/11/2007    History reviewed. No pertinent surgical history.    Allergies Patient has no known allergies.  Family History  Problem Relation Age of Onset  . CAD Mother   . Hypertension Mother   . Diabetes Neg Hx   . Stroke Neg Hx   . Cancer Neg Hx   . Kidney failure Neg Hx     Social History Social History  Substance Use Topics  . Smoking status: Never Smoker  . Smokeless tobacco: Never Used  . Alcohol use Yes     Comment: occasional    Review of Systems  Constitutional: No fever/chills. Positive fatigue.  Eyes: No visual changes. ENT: No sore  throat. Cardiovascular: Denies chest pain. Respiratory: Denies shortness of breath. Gastrointestinal: No abdominal pain.  No nausea, no vomiting.  No diarrhea.  No constipation. Genitourinary: Negative for dysuria. Musculoskeletal: Negative for back pain. Skin: Negative for rash. Neurological: Negative for headaches, focal weakness or numbness.  10-point ROS otherwise negative.  ____________________________________________   PHYSICAL EXAM:  VITAL SIGNS: ED Triage Vitals  Enc Vitals Group     BP 12/11/16 1456 (!) 189/117     Pulse Rate 12/11/16 1456 95     Resp 12/11/16 1456 16     Temp 12/11/16 1456 98.2 F (36.8 C)     Temp Source 12/11/16 1456 Oral     SpO2 12/11/16 1456 100 %     Weight 12/11/16 1459 235 lb (106.6 kg)     Height 12/11/16 1459 6\' 1"  (1.854 m)   Constitutional: Alert and oriented. Well appearing and in no acute distress. Eyes: Conjunctivae are normal. Head: Atraumatic. Nose: No congestion/rhinnorhea. Mouth/Throat: Mucous membranes are moist.  Neck: No stridor. Cardiovascular: Normal rate, regular rhythm. Good peripheral circulation. Grossly normal heart sounds.   Respiratory: Normal respiratory effort.  No retractions. Lungs with faint crackles bilaterally.  Gastrointestinal: Soft and nontender. No distention.  Musculoskeletal: No lower extremity tenderness. 1+ bilateral LE edema L > R. No gross deformities of extremities. Neurologic:  Normal speech and language. No gross focal neurologic deficits are appreciated.  Skin:  Skin is warm, dry and intact. No rash noted.  ____________________________________________   LABS (all labs ordered are listed, but only abnormal results are displayed)  Labs Reviewed  BASIC METABOLIC PANEL - Abnormal; Notable for the following:       Result Value   Glucose, Bld 110 (*)    BUN 22 (*)    Creatinine, Ser 2.25 (*)    Calcium 8.8 (*)    GFR calc non Af Amer 33 (*)    GFR calc Af Amer 39 (*)    All other  components within normal limits  CBC WITH DIFFERENTIAL/PLATELET - Abnormal; Notable for the following:    RBC 3.85 (*)    Hemoglobin 11.4 (*)    HCT 32.9 (*)    All other components within normal limits  URINALYSIS, ROUTINE W REFLEX MICROSCOPIC - Abnormal; Notable for the following:    Color, Urine STRAW (*)    Hgb urine dipstick MODERATE (*)    Protein, ur >300 (*)    All other components within normal limits  BRAIN NATRIURETIC PEPTIDE - Abnormal; Notable for the following:    B Natriuretic Peptide 261.6 (*)    All other components within normal limits  URINALYSIS, MICROSCOPIC (REFLEX) - Abnormal; Notable for the following:    Bacteria, UA FEW (*)    All other components within normal limits  RAPID URINE DRUG SCREEN, HOSP PERFORMED - Abnormal; Notable for the following:    Tetrahydrocannabinol POSITIVE (*)    All other components within normal limits  TROPONIN I - Abnormal; Notable for the following:    Troponin I 0.06 (*)    All other components within normal limits  TROPONIN I - Abnormal; Notable for the following:    Troponin I 0.08 (*)    All other components within normal limits  BRAIN NATRIURETIC PEPTIDE - Abnormal; Notable for the following:    B Natriuretic Peptide 483.7 (*)    All other components within normal limits  LIPID PANEL - Abnormal; Notable for the following:    HDL 32 (*)    LDL Cholesterol 139 (*)    All other components within normal limits  MAGNESIUM - Abnormal; Notable for the following:    Magnesium 1.4 (*)    All other components within normal limits  COMPREHENSIVE METABOLIC PANEL - Abnormal; Notable for the following:    Glucose, Bld 155 (*)    BUN 21 (*)    Creatinine, Ser 2.27 (*)    Calcium 8.6 (*)    Albumin 3.0 (*)    GFR calc non Af Amer 33 (*)    GFR calc Af Amer 38 (*)    All other components within normal limits  CBC - Abnormal; Notable for the following:    RBC 3.82 (*)    Hemoglobin 11.4 (*)    HCT 32.2 (*)    All other  components within normal limits  GLUCOSE, CAPILLARY - Abnormal; Notable for the following:    Glucose-Capillary 122 (*)    All other components within normal limits  PHOSPHORUS  TSH  TROPONIN I  SODIUM, URINE, RANDOM  CREATININE, URINE, RANDOM  HIV ANTIBODY (ROUTINE TESTING)  HEMOGLOBIN A1C  I-STAT TROPOININ, ED   ____________________________________________  EKG  Reviewed. Not crossing in MUSE. Normal intervals. No STEMI.   ____________________________________________  RADIOLOGY  Dg Chest 2 View  Result Date: 12/11/2016 CLINICAL DATA:  Acute onset of bilateral leg swelling, worse on the left. High blood pressure. Dizziness. Initial encounter. EXAM: CHEST  2 VIEW COMPARISON:  None. FINDINGS: The lungs are well-aerated.  Mild vascular congestion is noted. Mildly increased interstitial markings likely reflect mild interstitial edema. Peribronchial thickening is seen. There is no evidence of pleural effusion or pneumothorax. The heart is normal in size; the mediastinal contour is within normal limits. No acute osseous abnormalities are seen. IMPRESSION: Mild vascular congestion. Mildly increased interstitial markings likely reflect mild interstitial edema. Peribronchial thickening noted. Electronically Signed   By: Garald Balding M.D.   On: 12/11/2016 20:10   US Renal  Result Date: 12/12/2016 CLINICAL DATA:  Renal failure, history of diabetes and hypertension EXAM: RENAL / URINARY TRACT ULTRASOUND COMPLETE COMPARISON:  None in PACs FINDINGS: Right Kidney: Length: 12.6 cm. The renal cortical echotexture is approximately equal to that of the adjacent liver. There is no hydronephrosis. There is no solid mass. There is a simple appearing cyst in the lower pole cortex measuring 1.3 cm in diameter. Left Kidney: Length: 11.7 cm. The renal cortical echotexture is similar to that on the right. There is no focal mass or hydronephrosis. Bladder: Appears normal for degree of bladder distention. The  prostate gland is enlarged measuring 5.6 cm transversely by 3.1 cm longitudinally by 5.2 cm AP. It produces a mild impression upon the urinary bladder base. IMPRESSION: Mildly increased renal cortical echotexture bilaterally is consistent with medical renal disease. There is no hydronephrosis. Mild prostate enlargement. Otherwise normal appearing urinary bladder. Electronically Signed   By: David  Martinique M.D.   On: 12/12/2016 08:34    ____________________________________________   PROCEDURES  Procedure(s) performed:   Procedures  CRITICAL CARE Performed by: Margette Fast Total critical care time: 40 minutes Critical care time was exclusive of separately billable procedures and treating other patients. Critical care was necessary to treat or prevent imminent or life-threatening deterioration. Critical care was time spent personally by me on the following activities: development of treatment plan with patient and/or surrogate as well as nursing, discussions with consultants, evaluation of patient's response to treatment, examination of patient, obtaining history from patient or surrogate, ordering and performing treatments and interventions, ordering and review of laboratory studies, ordering and review of radiographic studies, pulse oximetry and re-evaluation of patient's condition.  Nanda Quinton, MD Emergency Medicine  ________________________________________   INITIAL IMPRESSION / ASSESSMENT AND PLAN / ED COURSE  Pertinent labs & imaging results that were available during my care of the patient were reviewed by me and considered in my medical decision making (see chart for details).  Patient presents to the emergency department for evaluation of lower extremity edema with some fatigue. He has significantly elevated blood pressures and some lower extremity pitting edema in the setting of medication noncompliance. No acute distress. Normal lung exam. Plan for lab work, EKG, chest x-ray  with fluid. No clear evidence of hypertensive emergency at this time. Will follow labs and consider low-dose HTN medication and close PCP follow up.   09:39 PM  Patient has elevated creatinine from prior value along with BUN. His BNP is also elevated. Given his acute onset lower extremity edema is some concern that his blood pressure is causing HTN emergency. He does not have a primary care physician to follow up with. Believe patint would benefit from inpatient blood pressure management and echocardiogram to rule out CHF. Patient given IV Labetalol in the ED with BP >220 and evidence of end-organ damage.   Discussed patient's case with Hospitalist, Dr. Roel Cluck. Patient and family (if present) updated with plan. Care transferred to Hospitalist service.  I reviewed all nursing notes, vitals, pertinent old records,  EKGs, labs, imaging (as available).  ____________________________________________  FINAL CLINICAL IMPRESSION(S) / ED DIAGNOSES  Final diagnoses:  Essential hypertension  Peripheral edema  AKI (acute kidney injury) ([place])  Hypertensive emergency     MEDICATIONS GIVEN DURING THIS VISIT:  Medications  labetalol (NORMODYNE,TRANDATE) injection 10 mg (not administered)  sodium chloride flush (NS) 0.9 % injection 3 mL (0 mLs Intravenous Duplicate 02/26/82 3383)  acetaminophen (TYLENOL) tablet 650 mg (650 mg Oral Given 12/12/16 0259)    Or  acetaminophen (TYLENOL) suppository 650 mg ( Rectal See Alternative 12/12/16 0259)  ondansetron (ZOFRAN) tablet 4 mg (not administered)    Or  ondansetron (ZOFRAN) injection 4 mg (not administered)  insulin aspart (novoLOG) injection 0-5 Units (not administered)  enoxaparin (LOVENOX) injection 40 mg (not administered)  sodium chloride flush (NS) 0.9 % injection 3 mL (3 mLs Intravenous Given 12/12/16 0237)  sodium chloride flush (NS) 0.9 % injection 3 mL (not administered)  0.9 %  sodium chloride infusion (not administered)  insulin aspart  (novoLOG) injection 0-9 Units (not administered)  carvedilol (COREG) tablet 3.125 mg (not administered)  hydrALAZINE (APRESOLINE) injection 10 mg (10 mg Intravenous Given 12/12/16 0124)  amLODipine (NORVASC) tablet 5 mg (5 mg Oral Given 12/11/16 2151)     NEW OUTPATIENT MEDICATIONS STARTED DURING THIS VISIT:  None   Note:  This document was prepared using Dragon voice recognition software and may include unintentional dictation errors.  Nanda Quinton, MD Emergency Medicine    Kiran Lapine, [facility], MD 12/12/16 [phone]

## 2016-12-11 NOTE — ED Notes (Signed)
Patient hypertensive in the waiting room. Patient is symptomatic with dizziness and swelling. Patient denies headache. Agricultural consultant notified.

## 2016-12-11 NOTE — ED Notes (Signed)
Provider notified of elevated BP

## 2016-12-11 NOTE — ED Triage Notes (Signed)
Pt began having swelling in his legs 2 days ago.  He is prescribed fluid pill but has not been taking it.  Swelling has gotten worse and is mainly now in the left leg.

## 2016-12-11 NOTE — ED Notes (Signed)
Patient now denies being prescribed fluid pill or BP medication.

## 2016-12-11 NOTE — H&P (Signed)
Corey Park:[id number] DOB: Nov 16, 1970 DOA: 12/11/2016     PCP: Patient, No Pcp Per   Outpatient Specialists: none Patient coming from:  home Lives With family   Chief Complaint: leg edema  HPI: Corey Park is a 46 y.o. male with medical history significant of uncontrolled diabetes, hypertension     Presented with 2 day history of lower extremity edema he supposed to have fluids by mouth but he hasn't been taking it. Patient has long-standing history of severe hypertension uncontrolled for years endorses some normalize fatigue but no localized weakness recently he has been working and standing up for prolonged period time and feels that that has worsen his leg edema he haven't had any chest pain or shortness of breath not short of breath when he lays down flat no headaches no blurred vision no fever or chills. occasionally cough.      Regarding pertinent Chronic problems: History of poorly controlled hypertension he hasn't been following up with primary care provider childhood asthma IN ER:  Temp (24hrs), Avg:98.4 F (36.9 C), Min:98.2 F (36.8 C), Max:98.6 F (37 C)      on arrival  ED Triage Vitals  Enc Vitals Group     BP 12/11/16 1456 (!) 189/117     Pulse Rate 12/11/16 1456 95     Resp 12/11/16 1456 16     Temp 12/11/16 1456 98.2 F (36.8 C)     Temp Source 12/11/16 1456 Oral     SpO2 12/11/16 1456 100 %     Weight 12/11/16 1459 235 lb (106.6 kg)     Height 12/11/16 1459 6\' 1"  (1.854 m)     Head Circumference --      Peak Flow --      Pain Score --      Pain Loc --      Pain Edu? --      Excl. in [place]? --   Blood pressure  Up to  2 07/ 131 he was   given labetalol IV and Norvasc Currently blood pressure down to 189/110 Troponin 0.05 Sodium 139 potassium 3.7 BUN 22 creatinine was 2.25 which is up from baseline 2 years ago 1.38 WBC 7.1 hemoglobin 11.4 BNP 261.6 Chest x-ray showing mild vascular congestion Following Medications were ordered in  ER: Medications  labetalol (NORMODYNE,TRANDATE) injection 10 mg (10 mg Intravenous Given 12/11/16 2159)  amLODipine (NORVASC) tablet 5 mg (5 mg Oral Given 12/11/16 2151)      Hospitalist was called for admission for Hypertensive urgency and acute renal failure vs progression of CKD  Review of Systems:    Pertinent positives include: fatigue Bilateral lower extremity swelling   Constitutional:  No weight loss, night sweats, Fevers, chills, , weight loss  HEENT:  No headaches, Difficulty swallowing,Tooth/dental problems,Sore throat,  No sneezing, itching, ear ache, nasal congestion, post nasal drip,  Cardio-vascular:  No chest pain, Orthopnea, PND, anasarca, dizziness, palpitations.no GI:  No heartburn, indigestion, abdominal pain, nausea, vomiting, diarrhea, change in bowel habits, loss of appetite, melena, blood in stool, hematemesis Resp:  no shortness of breath at rest. No dyspnea on exertion, No excess mucus, no productive cough, No non-productive cough, No coughing up of blood.No change in color of mucus.No wheezing. Skin:  no rash or lesions. No jaundice GU:  no dysuria, change in color of urine, no urgency or frequency. No straining to urinate.  No flank pain.  Musculoskeletal:  No joint pain or no joint swelling. No decreased range of motion.  No back pain.  Psych:  No change in mood or affect. No depression or anxiety. No memory loss.  Neuro: no localizing neurological complaints, no tingling, no weakness, no double vision, no gait abnormality, no slurred speech, no confusion  As per HPI otherwise 10 point review of systems negative.   Past Medical History: Past Medical History:  Diagnosis Date  . Diabetes mellitus without complication ([place])   . Hypertension    History reviewed. No pertinent surgical history.   Social History:  Ambulatory independently      reports that he has never smoked. He does not have any smokeless tobacco history on file. He reports that  he does not drink alcohol. His drug history is not on file.  Allergies:  No Known Allergies     Family History:   Family History  Problem Relation Age of Onset  . CAD Mother   . Hypertension Mother   . Diabetes Neg Hx   . Stroke Neg Hx   . Cancer Neg Hx   . Kidney failure Neg Hx     Medications: Prior to Admission medications   Medication Sig Start Date End Date Taking? Authorizing Provider  albuterol (PROVENTIL HFA;VENTOLIN HFA) 108 (90 Base) MCG/ACT inhaler Inhale 2 puffs into the lungs every 4 (four) hours as needed for wheezing or shortness of breath. Patient not taking: Reported on 12/11/2016 01/04/16   Janne Napoleon, NP    Physical Exam: Patient Vitals for the past 24 hrs:  BP Temp Temp src Pulse Resp SpO2 Height Weight  12/11/16 2151 (!) 207/131 - - - - - - -  12/11/16 2039 (!) 203/123 - - (!) 104 17 97 % - -  12/11/16 1945 (!) 195/124 - - 98 14 98 % - -  12/11/16 1944 (!) 204/127 - - 99 17 98 % - -  12/11/16 1643 (!) 206/118 98.6 F (37 C) Oral 88 18 100 % - -  12/11/16 1459 - - - - - - 6\' 1"  (1.854 m) 106.6 kg (235 lb)  12/11/16 1456 (!) 189/117 98.2 F (36.8 C) Oral 95 16 100 % - -    1. General:  in No Acute distress 2. Psychological: Alert and  Oriented 3. Head/ENT:   Moist   Mucous Membranes                          Head Non traumatic, neck supple                            Poor Dentition 4. SKIN: normal   Skin turgor,  Skin clean Dry and intact no rash 5. Heart: Regular rate and rhythm no Murmur, Rub or gallop 6. Lungs:  no wheezes mild  crackles   7. Abdomen: Soft, non-tender, Non distended 8. Lower extremities: no clubbing, cyanosis, trace  edema 9. Neurologically Grossly intact, moving all 4 extremities equally  10. MSK: Normal range of motion   body mass index is 31 kg/m.  Labs on Admission:   Labs on Admission: I have personally reviewed following labs and imaging studies  CBC:  Recent Labs Lab 12/11/16 1953  WBC 7.1  NEUTROABS 4.5   HGB 11.4*  HCT 32.9*  MCV 85.5  PLT 244   Basic Metabolic Panel:  Recent Labs Lab 12/11/16 1953  NA 139  K 3.7  CL 106  CO2 25  GLUCOSE 110*  BUN 22*  CREATININE  2.25*  CALCIUM 8.8*   GFR: Estimated Creatinine Clearance: 53.1 mL/min (A) (by C-G formula based on SCr of 2.25 mg/dL (H)). Liver Function Tests: No results for input(s): AST, ALT, ALKPHOS, BILITOT, PROT, ALBUMIN in the last 168 hours. No results for input(s): LIPASE, AMYLASE in the last 168 hours. No results for input(s): AMMONIA in the last 168 hours. Coagulation Profile: No results for input(s): INR, PROTIME in the last 168 hours. Cardiac Enzymes: No results for input(s): CKTOTAL, CKMB, CKMBINDEX, TROPONINI in the last 168 hours. BNP (last 3 results) No results for input(s): PROBNP in the last 8760 hours. HbA1C: No results for input(s): HGBA1C in the last 72 hours. CBG: No results for input(s): GLUCAP in the last 168 hours. Lipid Profile: No results for input(s): CHOL, HDL, LDLCALC, TRIG, CHOLHDL, LDLDIRECT in the last 72 hours. Thyroid Function Tests: No results for input(s): TSH, T4TOTAL, FREET4, T3FREE, THYROIDAB in the last 72 hours. Anemia Panel: No results for input(s): VITAMINB12, FOLATE, FERRITIN, TIBC, IRON, RETICCTPCT in the last 72 hours. Urine analysis:    Component Value Date/Time   COLORURINE STRAW (A) 12/11/2016 1901   APPEARANCEUR CLEAR 12/11/2016 1901   LABSPEC 1.008 12/11/2016 1901   PHURINE 6.0 12/11/2016 1901   GLUCOSEU NEGATIVE 12/11/2016 1901   HGBUR MODERATE (A) 12/11/2016 1901   BILIRUBINUR NEGATIVE 12/11/2016 1901   KETONESUR NEGATIVE 12/11/2016 1901   PROTEINUR >300 (A) 12/11/2016 1901   NITRITE NEGATIVE 12/11/2016 1901   LEUKOCYTESUR NEGATIVE 12/11/2016 1901   Sepsis Labs: @LABRCNTIP (procalcitonin:4,lacticidven:4) )No results found for this or any previous visit (from the past 240 hour(s)).      UA  no evidence of UTI  proteinuria  Lab Results  Component Value  Date   HGBA1C 11.8 06/11/2007    Estimated Creatinine Clearance: 53.1 mL/min (A) (by C-G formula based on SCr of 2.25 mg/dL (H)).  BNP (last 3 results) No results for input(s): PROBNP in the last 8760 hours.   ECG REPORT  Independently reviewed Rate: 99  Rhythm: Sinus ST&T Change: Anterior ST segment elevation most likely secondary to LVH [id number]  Filed Weights   12/11/16 1459  Weight: 106.6 kg (235 lb)     Cultures: No results found for: SDES, SPECREQUEST, CULT, REPTSTATUS   Radiological Exams on Admission: Dg Chest 2 View  Result Date: 12/11/2016 CLINICAL DATA:  Acute onset of bilateral leg swelling, worse on the left. High blood pressure. Dizziness. Initial encounter. EXAM: CHEST  2 VIEW COMPARISON:  None. FINDINGS: The lungs are well-aerated. Mild vascular congestion is noted. Mildly increased interstitial markings likely reflect mild interstitial edema. Peribronchial thickening is seen. There is no evidence of pleural effusion or pneumothorax. The heart is normal in size; the mediastinal contour is within normal limits. No acute osseous abnormalities are seen. IMPRESSION: Mild vascular congestion. Mildly increased interstitial markings likely reflect mild interstitial edema. Peribronchial thickening noted. Electronically Signed   By: Garald Balding M.D.   On: 12/11/2016 20:10    Chart has been reviewed    Assessment/Plan  46 y.o. male with medical history significant of uncontrolled diabetes, hypertension   Admitted for  Hypertensive urgency and acute renal failure vs progression of CKD    Present on Admission:  . Hypertensive urgency - patient has chronically elevated blood pressure is uncontrolled. Patient will need extensive follow-up, he will likely need medication titration and mom and one agent for now start on Coreg and hold off ACE inhibitor until baseline renal function is established Elevated troponin in the setting of  hypertensive urgency patient denies any  chest pain. Will order echo gram monitoring on telemetry . AKI (acute kidney injury) ([place]) - unsure if patient has chronic kidney disease and this is acute failure. Obtain urine electrolytes order renal ultrasound patient will likely need to follow up with nephrology of kidney function does not improve . Fluid overload - Mild obtain echogram to evaluate for heart failure given leg edema   and elevated BNP . HLD (hyperlipidemia) Lipid panel and manage as needed Diabetes mellitus poorly controlled to order hemoglobin A1c and sliding scale  Other plan as per orders.  DVT prophylaxis:    Lovenox     Code Status:  FULL CODE as per patient    Family Communication:   Family  at  Bedside  plan of care was discussed with  Wife,  Disposition Plan:    To home once workup is complete and patient is stable     Consults called: email cardiology  Admission status:   obs   Level of care    tele        I have spent a total of 57 min on this admission   Aurie Harroun 12/12/2016, 12:48 AM    [facility]  Pager [phone]   after 2 AM please page floor coverage PA If 7AM-7PM, please contact the day team taking care of the patient  [email or web address]  Password [email or web address]

## 2016-12-12 ENCOUNTER — Observation Stay (HOSPITAL_COMMUNITY): Payer: Self-pay

## 2016-12-12 ENCOUNTER — Observation Stay (HOSPITAL_BASED_OUTPATIENT_CLINIC_OR_DEPARTMENT_OTHER): Payer: 59

## 2016-12-12 ENCOUNTER — Encounter (HOSPITAL_COMMUNITY): Payer: Self-pay | Admitting: Internal Medicine

## 2016-12-12 DIAGNOSIS — I248 Other forms of acute ischemic heart disease: Secondary | ICD-10-CM

## 2016-12-12 DIAGNOSIS — I161 Hypertensive emergency: Principal | ICD-10-CM

## 2016-12-12 DIAGNOSIS — I160 Hypertensive urgency: Secondary | ICD-10-CM | POA: Diagnosis not present

## 2016-12-12 DIAGNOSIS — N19 Unspecified kidney failure: Secondary | ICD-10-CM | POA: Diagnosis not present

## 2016-12-12 DIAGNOSIS — I370 Nonrheumatic pulmonary valve stenosis: Secondary | ICD-10-CM

## 2016-12-12 DIAGNOSIS — R809 Proteinuria, unspecified: Secondary | ICD-10-CM | POA: Diagnosis not present

## 2016-12-12 DIAGNOSIS — E8779 Other fluid overload: Secondary | ICD-10-CM | POA: Diagnosis not present

## 2016-12-12 DIAGNOSIS — E1129 Type 2 diabetes mellitus with other diabetic kidney complication: Secondary | ICD-10-CM | POA: Diagnosis not present

## 2016-12-12 DIAGNOSIS — N179 Acute kidney failure, unspecified: Secondary | ICD-10-CM | POA: Diagnosis not present

## 2016-12-12 LAB — ECHOCARDIOGRAM COMPLETE
HEIGHTINCHES: 73 in
Weight: 3667.2 oz

## 2016-12-12 LAB — BRAIN NATRIURETIC PEPTIDE: B NATRIURETIC PEPTIDE 5: 483.7 pg/mL — AB (ref 0.0–100.0)

## 2016-12-12 LAB — LIPID PANEL
CHOL/HDL RATIO: 6.1 ratio
Cholesterol: 195 mg/dL (ref 0–200)
HDL: 32 mg/dL — ABNORMAL LOW (ref >40)
LDL CALC: 139 mg/dL — AB (ref 0–99)
Triglycerides: 119 mg/dL (ref <150)
VLDL: 24 mg/dL (ref 0–40)

## 2016-12-12 LAB — COMPREHENSIVE METABOLIC PANEL
ALBUMIN: 3 g/dL — AB (ref 3.5–5.0)
ALK PHOS: 61 U/L (ref 38–126)
ALT: 21 U/L (ref 17–63)
ANION GAP: 8 (ref 5–15)
AST: 36 U/L (ref 15–41)
BILIRUBIN TOTAL: 0.6 mg/dL (ref 0.3–1.2)
BUN: 21 mg/dL — ABNORMAL HIGH (ref 6–20)
CALCIUM: 8.6 mg/dL — AB (ref 8.9–10.3)
CO2: 25 mmol/L (ref 22–32)
CREATININE: 2.27 mg/dL — AB (ref 0.61–1.24)
Chloride: 105 mmol/L (ref 101–111)
GFR calc Af Amer: 38 mL/min — ABNORMAL LOW (ref >60)
GFR calc non Af Amer: 33 mL/min — ABNORMAL LOW (ref >60)
GLUCOSE: 155 mg/dL — AB (ref 65–99)
Potassium: 3.7 mmol/L (ref 3.5–5.1)
Sodium: 138 mmol/L (ref 135–145)
Total Protein: 7 g/dL (ref 6.5–8.1)

## 2016-12-12 LAB — TSH: TSH: 0.826 u[IU]/mL (ref 0.350–4.500)

## 2016-12-12 LAB — CBC
HCT: 32.2 % — ABNORMAL LOW (ref 39.0–52.0)
HEMOGLOBIN: 11.4 g/dL — AB (ref 13.0–17.0)
MCH: 29.8 pg (ref 26.0–34.0)
MCHC: 35.4 g/dL (ref 30.0–36.0)
MCV: 84.3 fL (ref 78.0–100.0)
Platelets: 225 10*3/uL (ref 150–400)
RBC: 3.82 MIL/uL — ABNORMAL LOW (ref 4.22–5.81)
RDW: 13 % (ref 11.5–15.5)
WBC: 9.6 10*3/uL (ref 4.0–10.5)

## 2016-12-12 LAB — HIV ANTIBODY (ROUTINE TESTING W REFLEX): HIV SCREEN 4TH GENERATION: NONREACTIVE

## 2016-12-12 LAB — GLUCOSE, CAPILLARY
Glucose-Capillary: 122 mg/dL — ABNORMAL HIGH (ref 65–99)
Glucose-Capillary: 135 mg/dL — ABNORMAL HIGH (ref 65–99)
Glucose-Capillary: 165 mg/dL — ABNORMAL HIGH (ref 65–99)
Glucose-Capillary: 139 mg/dL — ABNORMAL HIGH (ref 65–99)

## 2016-12-12 LAB — TROPONIN I
Troponin I: 0.08 ng/mL (ref <0.03)
Troponin I: 0.08 ng/mL (ref <0.03)

## 2016-12-12 LAB — MAGNESIUM: MAGNESIUM: 1.4 mg/dL — AB (ref 1.7–2.4)

## 2016-12-12 LAB — PHOSPHORUS: Phosphorus: 2.5 mg/dL (ref 2.5–4.6)

## 2016-12-12 LAB — SODIUM, URINE, RANDOM: Sodium, Ur: 112 mmol/L

## 2016-12-12 LAB — CREATININE, URINE, RANDOM: Creatinine, Urine: 58.43 mg/dL

## 2016-12-12 MED ORDER — ACETAMINOPHEN 325 MG PO TABS
650.0000 mg | ORAL_TABLET | Freq: Four times a day (QID) | ORAL | Status: DC | PRN
  Administered 2016-12-12: 650 mg via ORAL
  Filled 2016-12-12: qty 2

## 2016-12-12 MED ORDER — INSULIN ASPART 100 UNIT/ML ~~LOC~~ SOLN: 0.0000 [IU] | Freq: Every day | SUBCUTANEOUS | Status: DC

## 2016-12-12 MED ORDER — CARVEDILOL 3.125 MG PO TABS
3.1250 mg | ORAL_TABLET | Freq: Two times a day (BID) | ORAL | Status: DC
  Administered 2016-12-12: 3.125 mg via ORAL
  Filled 2016-12-12 (×3): qty 1

## 2016-12-12 MED ORDER — ONDANSETRON HCL 4 MG/2ML IJ SOLN
4.0000 mg | Freq: Four times a day (QID) | INTRAMUSCULAR | Status: DC | PRN
  Administered 2016-12-12: 4 mg via INTRAVENOUS
  Filled 2016-12-12: qty 2

## 2016-12-12 MED ORDER — SODIUM CHLORIDE 0.45 % IV BOLUS
1000.0000 mL | Freq: Once | INTRAVENOUS | Status: AC
  Administered 2016-12-12: 1000 mL via INTRAVENOUS

## 2016-12-12 MED ORDER — FUROSEMIDE 10 MG/ML IJ SOLN
40.0000 mg | Freq: Once | INTRAMUSCULAR | Status: AC
  Administered 2016-12-12: 40 mg via INTRAVENOUS
  Filled 2016-12-12: qty 4

## 2016-12-12 MED ORDER — SODIUM CHLORIDE 0.9% FLUSH: 3.0000 mL | INTRAVENOUS | Status: DC | PRN

## 2016-12-12 MED ORDER — CARVEDILOL 6.25 MG PO TABS
6.2500 mg | ORAL_TABLET | Freq: Two times a day (BID) | ORAL | Status: DC
  Administered 2016-12-12 – 2016-12-13 (×2): 6.25 mg via ORAL
  Filled 2016-12-12 (×2): qty 1

## 2016-12-12 MED ORDER — SODIUM CHLORIDE 0.9 % IV SOLN: 250.0000 mL | INTRAVENOUS | Status: DC | PRN

## 2016-12-12 MED ORDER — SODIUM CHLORIDE 0.9% FLUSH
3.0000 mL | Freq: Two times a day (BID) | INTRAVENOUS | Status: DC
  Administered 2016-12-12 – 2016-12-13 (×2): 3 mL via INTRAVENOUS

## 2016-12-12 MED ORDER — HYDRALAZINE HCL 20 MG/ML IJ SOLN
10.0000 mg | Freq: Once | INTRAMUSCULAR | Status: AC
Start: 2016-12-12 — End: 2016-12-12
  Administered 2016-12-12: 10 mg via INTRAVENOUS
  Filled 2016-12-12: qty 0.5

## 2016-12-12 MED ORDER — AMLODIPINE BESYLATE 5 MG PO TABS
5.0000 mg | ORAL_TABLET | Freq: Every day | ORAL | Status: DC
  Administered 2016-12-12: 5 mg via ORAL

## 2016-12-12 MED ORDER — SODIUM CHLORIDE 0.9% FLUSH
3.0000 mL | Freq: Two times a day (BID) | INTRAVENOUS | Status: DC
  Administered 2016-12-12 – 2016-12-14 (×6): 3 mL via INTRAVENOUS

## 2016-12-12 MED ORDER — INSULIN ASPART 100 UNIT/ML ~~LOC~~ SOLN
0.0000 [IU] | Freq: Three times a day (TID) | SUBCUTANEOUS | Status: DC
  Administered 2016-12-12: 2 [IU] via SUBCUTANEOUS
  Administered 2016-12-12 – 2016-12-13 (×3): 1 [IU] via SUBCUTANEOUS
  Administered 2016-12-13: 2 [IU] via SUBCUTANEOUS
  Administered 2016-12-14 (×2): 1 [IU] via SUBCUTANEOUS

## 2016-12-12 MED ORDER — ACETAMINOPHEN 650 MG RE SUPP: 650.0000 mg | Freq: Four times a day (QID) | RECTAL | Status: DC | PRN

## 2016-12-12 MED ORDER — HYDRALAZINE HCL 20 MG/ML IJ SOLN
10.0000 mg | INTRAMUSCULAR | Status: DC | PRN
  Administered 2016-12-12 – 2016-12-13 (×3): 10 mg via INTRAVENOUS
  Filled 2016-12-12 (×2): qty 0.5

## 2016-12-12 MED ORDER — ONDANSETRON HCL 4 MG/2ML IJ SOLN: 4.0000 mg | Freq: Once | INTRAMUSCULAR | Status: AC

## 2016-12-12 MED ORDER — ONDANSETRON HCL 4 MG PO TABS: 4.0000 mg | ORAL_TABLET | Freq: Four times a day (QID) | ORAL | Status: DC | PRN

## 2016-12-12 MED ORDER — ENOXAPARIN SODIUM 40 MG/0.4ML ~~LOC~~ SOLN
40.0000 mg | SUBCUTANEOUS | Status: DC
  Administered 2016-12-12 – 2016-12-14 (×3): 40 mg via SUBCUTANEOUS
  Filled 2016-12-12 (×3): qty 0.4

## 2016-12-12 MED ORDER — LABETALOL HCL 5 MG/ML IV SOLN
10.0000 mg | INTRAVENOUS | Status: DC | PRN
  Filled 2016-12-12: qty 4

## 2016-12-12 MED ORDER — HYDRALAZINE HCL 20 MG/ML IJ SOLN
10.0000 mg | INTRAMUSCULAR | Status: DC | PRN
Start: 2016-12-12 — End: 2016-12-12
  Administered 2016-12-12 (×2): 10 mg via INTRAVENOUS
  Filled 2016-12-12 (×2): qty 0.5

## 2016-12-12 NOTE — Progress Notes (Signed)
  Echocardiogram 2D Echocardiogram has been performed.  Matilde Bash 12/12/2016, 3:30 PM

## 2016-12-12 NOTE — Progress Notes (Signed)
PROGRESS NOTE        PATIENT DETAILS Name: Corey Park Age: 46 y.o. Sex: male Date of Birth: Dec 07, 1970 Admit Date: 12/11/2016 Admitting Physician Toy Baker, MD OZD:[id number], No Pcp Per  Brief Narrative: Patient is a 46 y.o. male with past medical history of uncontrolled hypertension and uncontrolled type II diabetes. He doesn't take any medications at home and does not monitor his glucose levels. He presented to the ED last night for bilateral lower extremity edema, left>right. He was found to have high blood pressure of 189/117, increased Creatinine, BUN and BNP and was admitted for possible hypertensive emergency. He was given 1 dose of amlodipine and started on carvedilol.  Subjective: Patient lying comfortably in bed with no complaints. States the swelling has gone away in his legs. Denies chest pain, shortness of breath, nausea, abdominal pain or changes in bowel or bladder movements.   Assessment/Plan: Hypertensive emergency: Most recent BP 162/97.  - ECHO scheduled for today, pending results. - EKG today shows LVH but negative for STEMI - Continue Carvedilol  - Restart amlodipine  - Continue Hydralazine PRN for SBP>180 - Cardiology following.  DM (diabetes mellitus), type 2 [facility]): Patient eating well, without any GI symptoms or fatigue.  - CBG stable, Hgb A1c pending.  - Continue insulin  AKI (acute kidney injury) ([place]): Cr 2.27 drawn at midnight.  - Start sodium chloride .45% bolus x1  - Re-draw labs tomorrow morning and monitor Creatinine - Renal U.S. showed mildly increased renal cortical echotexture bilaterally, consistent with renal disease.  HLD (hyperlipidemia): HDL 32 and LDL 139.  - Start statin therapy.  DVT Prophylaxis: Prophylactic Lovenox   Code Status: Full code  Family Communication: Spouse at bedside  Disposition Plan: Remain inpatient-home once cardio work up has been completed.   Antimicrobial  agents: Anti-infectives    None      Procedures: Echo scheduled for today 12/12/2016  CONSULTS:  cardiology  Time spent: 25 minutes-Greater than 50% of this time was spent in counseling, explanation of diagnosis, planning of further management, and coordination of care.  MEDICATIONS: Scheduled Meds: . amLODipine  5 mg Oral Daily  . carvedilol  3.125 mg Oral BID WC  . enoxaparin (LOVENOX) injection  40 mg Subcutaneous Q24H  . insulin aspart  0-5 Units Subcutaneous QHS  . insulin aspart  0-9 Units Subcutaneous TID WC  . sodium chloride flush  3 mL Intravenous Q12H  . sodium chloride flush  3 mL Intravenous Q12H   Continuous Infusions: . sodium chloride    . sodium chloride     PRN Meds:.sodium chloride, acetaminophen **OR** acetaminophen, hydrALAZINE, [START ON 12/13/2016] labetalol, ondansetron **OR** ondansetron (ZOFRAN) IV, sodium chloride flush   PHYSICAL EXAM: Vital signs: Vitals:   12/12/16 0130 12/12/16 0249 12/12/16 0425 12/12/16 0556  BP: (!) 192/111  (!) 194/97 (!) 162/97  Pulse: (!) 107  (!) 103 (!) 105  Resp: 16  20 20   Temp:   98.7 F (37.1 C) 99.6 F (37.6 C)  TempSrc:   Oral Oral  SpO2: 96%  93% 98%  Weight:  104 kg (229 lb 3.2 oz)    Height:  6\' 1"  (1.854 m)     Filed Weights   12/11/16 1459 12/12/16 0249  Weight: 106.6 kg (235 lb) 104 kg (229 lb 3.2 oz)   Body mass index is 30.24 kg/m.  General appearance: Awake, alert, not in any distress. Speech Clear. Non toxic  HEENT: Atraumatic and Normocephalic Resp: CTA bilaterally, no added breath sounds. No accessory muscle use. CVS: RRR, without murmurs  GI: Normoactive bowel sounds, Non tender, non distended without gaurding Extremities: B/L Lower Ext shows no edema, both legs are warm to touch Neurology: Following commands, speech clear, No focal neuro deficits. Psychiatric: Normal judgment and insight. Alert and oriented x3. Normal mood. Musculoskeletal: No digital cyanosis Skin: No Rash, warm  and dry  I have personally reviewed following labs and imaging studies  LABORATORY DATA: CBC:  Recent Labs Lab 12/11/16 1953 12/12/16 0441  WBC 7.1 9.6  NEUTROABS 4.5  --   HGB 11.4* 11.4*  HCT 32.9* 32.2*  MCV 85.5 84.3  PLT 245 604    Basic Metabolic Panel:  Recent Labs Lab 12/11/16 1953 12/12/16 0441  NA 139 138  K 3.7 3.7  CL 106 105  CO2 25 25  GLUCOSE 110* 155*  BUN 22* 21*  CREATININE 2.25* 2.27*  CALCIUM 8.8* 8.6*  MG  --  1.4*  PHOS  --  2.5    GFR: Estimated Creatinine Clearance: 52 mL/min (A) (by C-G formula based on SCr of 2.27 mg/dL (H)).  Liver Function Tests:  Recent Labs Lab 12/12/16 0441  AST 36  ALT 21  ALKPHOS 61  BILITOT 0.6  PROT 7.0  ALBUMIN 3.0*   No results for input(s): LIPASE, AMYLASE in the last 168 hours. No results for input(s): AMMONIA in the last 168 hours.  Coagulation Profile: No results for input(s): INR, PROTIME in the last 168 hours.  Cardiac Enzymes:  Recent Labs Lab 12/11/16 2213 12/12/16 0441 12/12/16 1109  TROPONINI 0.06* 0.08* 0.08*    BNP (last 3 results) No results for input(s): PROBNP in the last 8760 hours.  HbA1C: No results for input(s): HGBA1C in the last 72 hours.  CBG:  Recent Labs Lab 12/12/16 0736 12/12/16 1133  GLUCAP 122* 135*    Lipid Profile:  Recent Labs  12/12/16 0441  CHOL 195  HDL 32*  LDLCALC 139*  TRIG 119  CHOLHDL 6.1    Thyroid Function Tests:  Recent Labs  12/12/16 0441  TSH 0.826    Anemia Panel: No results for input(s): VITAMINB12, FOLATE, FERRITIN, TIBC, IRON, RETICCTPCT in the last 72 hours.  Urine analysis:    Component Value Date/Time   COLORURINE STRAW (A) 12/11/2016 1901   APPEARANCEUR CLEAR 12/11/2016 1901   LABSPEC 1.008 12/11/2016 1901   PHURINE 6.0 12/11/2016 1901   GLUCOSEU NEGATIVE 12/11/2016 1901   HGBUR MODERATE (A) 12/11/2016 1901   BILIRUBINUR NEGATIVE 12/11/2016 1901   KETONESUR NEGATIVE 12/11/2016 1901   PROTEINUR  >300 (A) 12/11/2016 1901   NITRITE NEGATIVE 12/11/2016 1901   LEUKOCYTESUR NEGATIVE 12/11/2016 1901    Sepsis Labs: Lactic Acid, Venous No results found for: LATICACIDVEN  MICROBIOLOGY: No results found for this or any previous visit (from the past 240 hour(s)).  RADIOLOGY STUDIES/RESULTS: Dg Chest 2 View  Result Date: 12/11/2016 CLINICAL DATA:  Acute onset of bilateral leg swelling, worse on the left. High blood pressure. Dizziness. Initial encounter. EXAM: CHEST  2 VIEW COMPARISON:  None. FINDINGS: The lungs are well-aerated. Mild vascular congestion is noted. Mildly increased interstitial markings likely reflect mild interstitial edema. Peribronchial thickening is seen. There is no evidence of pleural effusion or pneumothorax. The heart is normal in size; the mediastinal contour is within normal limits. No acute osseous abnormalities are seen. IMPRESSION: Mild vascular  congestion. Mildly increased interstitial markings likely reflect mild interstitial edema. Peribronchial thickening noted. Electronically Signed   By: Garald Balding M.D.   On: 12/11/2016 20:10   US Renal  Result Date: 12/12/2016 CLINICAL DATA:  Renal failure, history of diabetes and hypertension EXAM: RENAL / URINARY TRACT ULTRASOUND COMPLETE COMPARISON:  None in PACs FINDINGS: Right Kidney: Length: 12.6 cm. The renal cortical echotexture is approximately equal to that of the adjacent liver. There is no hydronephrosis. There is no solid mass. There is a simple appearing cyst in the lower pole cortex measuring 1.3 cm in diameter. Left Kidney: Length: 11.7 cm. The renal cortical echotexture is similar to that on the right. There is no focal mass or hydronephrosis. Bladder: Appears normal for degree of bladder distention. The prostate gland is enlarged measuring 5.6 cm transversely by 3.1 cm longitudinally by 5.2 cm AP. It produces a mild impression upon the urinary bladder base. IMPRESSION: Mildly increased renal cortical  echotexture bilaterally is consistent with medical renal disease. There is no hydronephrosis. Mild prostate enlargement. Otherwise normal appearing urinary bladder. Electronically Signed   By: David  Martinique M.D.   On: 12/12/2016 08:34     LOS: 0 days   Kathleene Hazel, PA-S  [facility]  Attending MD note  Patient was seen, examined,treatment plan was discussed with the PA-S Kathleene Hazel.  I have personally reviewed the clinical findings, lab, imaging studies and management of this patient in detail. I agree with the documentation, as recorded by the PA-S and changes to above note below.  Patient is a 46 year old with medical history significant for uncontrolled diabetes mellitus and hypertension. Patient admitted for hypertensive urgency and acute renal failure.   Patient seen and examined report doing much better, swelling has decreased. Denies chest pain, shortness of breath, palpitation, dizziness and headaches.  On Exam: Gen. exam: Awake, alert, not in any distress Chest: Good air entry bilaterally, no rhonchi or rales CVS: S1-S2 regular, no murmurs, trace edema  Abdomen: Soft, nontender and nondistended Neurology: Non-focal Skin: No rash or lesions  Plan Impression: Hypertensive urgency Demand ischemia Acute kidney injury Fluid overload Diabetes mellitus type 2  Plan: Patient started on amlodipine and carvedilol which is increased by cardiology. Troponins flat, EKG with some T-wave inversion in the lateral leads, radiology concern about ACS Suspect some degree of chronic kidney disease renal ultrasound consistent, patient gently hydrated repeat BMP in the morning although also receiving IV Lasix per cardiology. Chest x-ray shows increasing vascular congestion with elevated BMP 1 dose of 40 mg IV is is given. Echocardiogram pending.  Monitor CBGs and continue SSI, hemoglobin A1c pending. Will start Lipitor given high risk factors Check labs in the  morning,  Rest as above  Chipper Oman, MD  If 7PM-7AM, please contact night-coverage [email or web address] Password [facility] 12/12/2016, 12:08 PM

## 2016-12-12 NOTE — Consult Note (Addendum)
Cardiology Consultation:   Patient ID: Corey Park; [id number]; 03/30/1971   Admit date: 12/11/2016 Date of Consult: 12/12/2016  Primary Care Provider: Patient, No Pcp Per Primary Cardiologist: None Primary Electrophysiologist:  None   Patient Profile:   Corey Park is a 46 y.o. male with a hx of hyperlipidemia, diabetes, and hypertension who is being seen today for the evaluation of hypertensive emergency at the request of Dr. Quincy Simmonds.  History of Present Illness:   Corey Park is a 46 year old male. He tells me that he has not been prescribed HTN or fluid pills ever before, this is inconsistent with what he has told previous providers. He said that he has not seen any providers for a very long time unless its urgent care. Reports getting diabetes medications through pharmacy but has not taken them in months.  He presented to the ER for bilateral lower extremity swelling and some general fatigue for the past 4 days. He recently started working a new job that requires prolonged standing Corey Park). No orthopnea, chest pain, N/V, DOE prior to arrival.   He currently has a headache and is feeling mildly SOB. He has had an episode of emesis while I'm in the room, per family member this is his first episode of emesis.  Blood pressure Max     215/131  12/12/2016, 0556       162/97  His ER evaluation revealed elevated troponin 0.06 << 0.08 BNP elevated to 483.7 Creatinine 2.27 (unknown baseline) LDL 139 Chest xray: mild vascular congestion, normal sized heart. Renal US: mildly increased renal cortical echotexture bilaterally consistent with medical renal disease.     Past Medical History:  Diagnosis Date  . Diabetes mellitus without complication ([place])   . Hypertension     History reviewed. No pertinent surgical history.   Inpatient Medications: Scheduled Meds: . amLODipine  5 mg Oral Daily  . carvedilol  3.125 mg Oral BID WC  . enoxaparin (LOVENOX) injection  40 mg  Subcutaneous Q24H  . insulin aspart  0-5 Units Subcutaneous QHS  . insulin aspart  0-9 Units Subcutaneous TID WC  . sodium chloride flush  3 mL Intravenous Q12H  . sodium chloride flush  3 mL Intravenous Q12H   Continuous Infusions: . sodium chloride    . sodium chloride     PRN Meds: sodium chloride, acetaminophen **OR** acetaminophen, hydrALAZINE, [START ON 12/13/2016] labetalol, ondansetron **OR** ondansetron (ZOFRAN) IV, sodium chloride flush  Allergies:   No Known Allergies  Social History:   Social History   Social History  . Marital status: Single    Spouse name: N/A  . Number of children: N/A  . Years of education: N/A   Occupational History  . Not on file.   Social History Main Topics  . Smoking status: Never Smoker  . Smokeless tobacco: Never Used  . Alcohol use Yes     Comment: occasional  . Drug use: Yes    Types: Marijuana  . Sexual activity: Not on file   Other Topics Concern  . Not on file   Social History Narrative  . No narrative on file    Family History:   The patient's family history includes CAD in his mother; Hypertension in his mother. There is no history of Diabetes, Stroke, Cancer, or Kidney failure.  ROS:  Please see the history of present illness.  All other ROS reviewed and negative.     Physical Exam/Data:   Vitals:   12/12/16 0130 12/12/16 0249  12/12/16 0425 12/12/16 0556  BP: (!) 192/111  (!) 194/97 (!) 162/97  Pulse: (!) 107  (!) 103 (!) 105  Resp: 16  20 20   Temp:   98.7 F (37.1 C) 99.6 F (37.6 C)  TempSrc:   Oral Oral  SpO2: 96%  93% 98%  Weight:  229 lb 3.2 oz (104 kg)    Height:  6\' 1"  (1.854 m)     No intake or output data in the 24 hours ending 12/12/16 1148 Filed Weights   12/11/16 1459 12/12/16 0249  Weight: 235 lb (106.6 kg) 229 lb 3.2 oz (104 kg)   Body mass index is 30.24 kg/m.  General: Well developed, well nourished, in no acute distress. Head: Normocephalic, atraumatic, sclera non-icteric, no  xanthomas, nares are without discharge.  Neck: Negative for carotid bruits. JVD not elevated. Lungs: Clear bilaterally to auscultation without wheezes, rales, or rhonchi. Breathing is unlabored. Heart: RRR with S1 S2. No murmurs, rubs, or gallops appreciated. Abdomen: Soft, non-tender, non-distended with normoactive bowel sounds. No hepatomegaly. No rebound/guarding. No obvious abdominal masses. Msk:  Strength and tone appear normal for age. Extremities: No clubbing or cyanosis.   Distal pedal pulses are 2+ and equal bilaterally. Significant LE pitting edema. Neuro: Alert and oriented X 3. No facial asymmetry. No focal deficit. Moves all extremities spontaneously. Psych:  Responds to questions appropriately with a normal affect.  EKG:  The EKG was personally reviewed and demonstrates sinus rhythm, HR 98, ant ST elevation consistent with LVH,    Relevant CV Studies:  2 D echo pending this admission.  Laboratory Data:  Chemistry Recent Labs Lab 12/11/16 1953 12/12/16 0441  NA 139 138  K 3.7 3.7  CL 106 105  CO2 25 25  GLUCOSE 110* 155*  BUN 22* 21*  CREATININE 2.25* 2.27*  CALCIUM 8.8* 8.6*  GFRNONAA 33* 33*  GFRAA 39* 38*  ANIONGAP 8 8     Recent Labs Lab 12/12/16 0441  PROT 7.0  ALBUMIN 3.0*  AST 36  ALT 21  ALKPHOS 61  BILITOT 0.6   Hematology Recent Labs Lab 12/11/16 1953 12/12/16 0441  WBC 7.1 9.6  RBC 3.85* 3.82*  HGB 11.4* 11.4*  HCT 32.9* 32.2*  MCV 85.5 84.3  MCH 29.6 29.8  MCHC 34.7 35.4  RDW 13.2 13.0  PLT 245 225   Cardiac Enzymes Recent Labs Lab 12/11/16 2213 12/12/16 0441  TROPONINI 0.06* 0.08*    Recent Labs Lab 12/11/16 2004  TROPIPOC 0.05    BNP Recent Labs Lab 12/11/16 1953 12/12/16 0441  BNP 261.6* 483.7*    DDimer No results for input(s): DDIMER in the last 168 hours.  Radiology/Studies:  Dg Chest 2 View  Result Date: 12/11/2016 CLINICAL DATA:  Acute onset of bilateral leg swelling, worse on the left. High blood  pressure. Dizziness. Initial encounter. EXAM: CHEST  2 VIEW COMPARISON:  None. FINDINGS: The lungs are well-aerated. Mild vascular congestion is noted. Mildly increased interstitial markings likely reflect mild interstitial edema. Peribronchial thickening is seen. There is no evidence of pleural effusion or pneumothorax. The heart is normal in size; the mediastinal contour is within normal limits. No acute osseous abnormalities are seen. IMPRESSION: Mild vascular congestion. Mildly increased interstitial markings likely reflect mild interstitial edema. Peribronchial thickening noted. Electronically Signed   By: Garald Balding M.D.   On: 12/11/2016 20:10   US Renal  Result Date: 12/12/2016 CLINICAL DATA:  Renal failure, history of diabetes and hypertension EXAM: RENAL / URINARY TRACT ULTRASOUND  COMPLETE COMPARISON:  None in PACs FINDINGS: Right Kidney: Length: 12.6 cm. The renal cortical echotexture is approximately equal to that of the adjacent liver. There is no hydronephrosis. There is no solid mass. There is a simple appearing cyst in the lower pole cortex measuring 1.3 cm in diameter. Left Kidney: Length: 11.7 cm. The renal cortical echotexture is similar to that on the right. There is no focal mass or hydronephrosis. Bladder: Appears normal for degree of bladder distention. The prostate gland is enlarged measuring 5.6 cm transversely by 3.1 cm longitudinally by 5.2 cm AP. It produces a mild impression upon the urinary bladder base. IMPRESSION: Mildly increased renal cortical echotexture bilaterally is consistent with medical renal disease. There is no hydronephrosis. Mild prostate enlargement. Otherwise normal appearing urinary bladder. Electronically Signed   By: David  Martinique M.D.   On: 12/12/2016 08:34    Assessment and Plan:   1. Hypertensive Emergency: Pt is not compliant with his medications, supposed to be on diuretic and antihypertensive medication at home that he was prescribed by a primary  care provider months ago. He has an elevated Trop, peak 0.08, likely related to his hypertension. He has not had chest pain and this does not present like ischemia. His creatinine is elevated to 2.25, his baseline is unclear but it was 1.38 in 2016.  -- currently on Norvasc 5 mg daily and Coreg 3.125 mg BID -- Labetalol 10 mg IV, q 12 hours PRN sBP < 180, has not required any dosages -- Hydralazine 10 mg IV, q 4 hours pRN sBP > 180, received one dose at 1:24 am -- Plan to add ACE once baseline creatinine is understood.  - headache with nausea and vomiting, potentially a migraine. With recent elevated BPs and now headache will order CT head.  2.  AKI: Baseline renal function is unknown. Renal US completed, no lesions, consistent with medical renal disease. -- 2.25, will watch closely.  3. Fluid overload: BNP elevated to 483.7, echocardiogram is pending for further assessment. Not currently receiving any lasix. -- Ordered strict I/O's and daily weights- currently no values -- Will discuss with Dr. Marlou Porch giving dose of IV lasix, question 3rd spacing but she does have interstitial edema on chest xray.  4. Hyperlipidemia: Will need to start on Statin  5. DM; Medicine to manage.   Kristopher Glee, PA-C  12/12/2016 11:48 AM  Personally seen and examined. Agree with above.  +cough, RRR +S4, no JVD, lungs clear, Pitting edema  Hypertensive emergency  - checking ECHO  - CXR with interstitial edema. Will give lasix 40 IV x1. Will likely need to re dose tomorrow  - BNP 483  - Watching BMET  - Increased coreg to 6.25 BID. Agree with amlodipine as well.   Anemia  -  mild may be related to CKD 3  Demand ischemia  - mildly elevated flat troponin  - not ACS  Will follow  Candee Furbish, MD

## 2016-12-13 ENCOUNTER — Encounter (HOSPITAL_COMMUNITY): Payer: Self-pay

## 2016-12-13 DIAGNOSIS — E1129 Type 2 diabetes mellitus with other diabetic kidney complication: Secondary | ICD-10-CM

## 2016-12-13 DIAGNOSIS — N183 Chronic kidney disease, stage 3 (moderate): Secondary | ICD-10-CM

## 2016-12-13 DIAGNOSIS — I160 Hypertensive urgency: Secondary | ICD-10-CM

## 2016-12-13 DIAGNOSIS — I10 Essential (primary) hypertension: Secondary | ICD-10-CM | POA: Diagnosis not present

## 2016-12-13 DIAGNOSIS — E8779 Other fluid overload: Secondary | ICD-10-CM | POA: Diagnosis not present

## 2016-12-13 DIAGNOSIS — N179 Acute kidney failure, unspecified: Secondary | ICD-10-CM | POA: Diagnosis not present

## 2016-12-13 DIAGNOSIS — R809 Proteinuria, unspecified: Secondary | ICD-10-CM

## 2016-12-13 DIAGNOSIS — I161 Hypertensive emergency: Secondary | ICD-10-CM | POA: Diagnosis not present

## 2016-12-13 DIAGNOSIS — E785 Hyperlipidemia, unspecified: Secondary | ICD-10-CM

## 2016-12-13 DIAGNOSIS — R609 Edema, unspecified: Secondary | ICD-10-CM

## 2016-12-13 HISTORY — DX: Hypomagnesemia: E83.42

## 2016-12-13 LAB — BRAIN NATRIURETIC PEPTIDE: B NATRIURETIC PEPTIDE 5: 629 pg/mL — AB (ref 0.0–100.0)

## 2016-12-13 LAB — BASIC METABOLIC PANEL
Anion gap: 10 (ref 5–15)
BUN: 25 mg/dL — AB (ref 6–20)
CALCIUM: 8.5 mg/dL — AB (ref 8.9–10.3)
CO2: 26 mmol/L (ref 22–32)
Chloride: 101 mmol/L (ref 101–111)
Creatinine, Ser: 2.56 mg/dL — ABNORMAL HIGH (ref 0.61–1.24)
GFR calc Af Amer: 33 mL/min — ABNORMAL LOW (ref >60)
GFR, EST NON AFRICAN AMERICAN: 29 mL/min — AB (ref >60)
GLUCOSE: 124 mg/dL — AB (ref 65–99)
POTASSIUM: 3.7 mmol/L (ref 3.5–5.1)
Sodium: 137 mmol/L (ref 135–145)

## 2016-12-13 LAB — MAGNESIUM: Magnesium: 1.3 mg/dL — ABNORMAL LOW (ref 1.7–2.4)

## 2016-12-13 LAB — CBC WITH DIFFERENTIAL/PLATELET
BASOS ABS: 0 10*3/uL (ref 0.0–0.1)
Basophils Relative: 0 %
EOS PCT: 0 %
Eosinophils Absolute: 0 10*3/uL (ref 0.0–0.7)
HCT: 35.1 % — ABNORMAL LOW (ref 39.0–52.0)
Hemoglobin: 12.3 g/dL — ABNORMAL LOW (ref 13.0–17.0)
LYMPHS PCT: 17 %
Lymphs Abs: 1.7 10*3/uL (ref 0.7–4.0)
MCH: 29.4 pg (ref 26.0–34.0)
MCHC: 35 g/dL (ref 30.0–36.0)
MCV: 84 fL (ref 78.0–100.0)
MONO ABS: 1 10*3/uL (ref 0.1–1.0)
Monocytes Relative: 10 %
Neutro Abs: 7.2 10*3/uL (ref 1.7–7.7)
Neutrophils Relative %: 73 %
PLATELETS: 211 10*3/uL (ref 150–400)
RBC: 4.18 MIL/uL — ABNORMAL LOW (ref 4.22–5.81)
RDW: 13.3 % (ref 11.5–15.5)
WBC: 9.9 10*3/uL (ref 4.0–10.5)

## 2016-12-13 LAB — HEMOGLOBIN A1C
HEMOGLOBIN A1C: 5.8 % — AB (ref 4.8–5.6)
MEAN PLASMA GLUCOSE: 120 mg/dL

## 2016-12-13 LAB — GLUCOSE, CAPILLARY
GLUCOSE-CAPILLARY: 125 mg/dL — AB (ref 65–99)
GLUCOSE-CAPILLARY: 164 mg/dL — AB (ref 65–99)
GLUCOSE-CAPILLARY: 111 mg/dL — AB (ref 65–99)
Glucose-Capillary: 151 mg/dL — ABNORMAL HIGH (ref 65–99)

## 2016-12-13 LAB — PHOSPHORUS: PHOSPHORUS: 2.9 mg/dL (ref 2.5–4.6)

## 2016-12-13 MED ORDER — CARVEDILOL 6.25 MG PO TABS
6.2500 mg | ORAL_TABLET | Freq: Once | ORAL | Status: AC
  Administered 2016-12-13: 6.25 mg via ORAL
  Filled 2016-12-13: qty 1

## 2016-12-13 MED ORDER — MAGNESIUM SULFATE 2 GM/50ML IV SOLN
2.0000 g | Freq: Once | INTRAVENOUS | Status: AC
  Administered 2016-12-13: 2 g via INTRAVENOUS
  Filled 2016-12-13: qty 50

## 2016-12-13 MED ORDER — CARVEDILOL 25 MG PO TABS
25.0000 mg | ORAL_TABLET | Freq: Two times a day (BID) | ORAL | Status: DC
  Administered 2016-12-13 – 2016-12-14 (×2): 25 mg via ORAL
  Filled 2016-12-13 (×2): qty 1

## 2016-12-13 MED ORDER — ATORVASTATIN CALCIUM 20 MG PO TABS
20.0000 mg | ORAL_TABLET | Freq: Every day | ORAL | Status: DC
  Administered 2016-12-13: 20 mg via ORAL
  Filled 2016-12-13: qty 1

## 2016-12-13 MED ORDER — AMLODIPINE BESYLATE 10 MG PO TABS
10.0000 mg | ORAL_TABLET | Freq: Every day | ORAL | Status: DC
  Administered 2016-12-13 – 2016-12-14 (×2): 10 mg via ORAL
  Filled 2016-12-13 (×2): qty 1

## 2016-12-13 NOTE — Progress Notes (Signed)
PROGRESS NOTE    Corey Park  UTM:[id number] DOB: 06-30-1970 DOA: 12/11/2016 PCP: Patient, No Pcp Per   Brief Narrative:  Patient is a 46 y.o. male with past medical history of uncontrolled hypertension and uncontrolled type II diabetes. He doesn't take any medications at home and does not monitor his glucose levels. He presented to the ED last night for bilateral lower extremity edema, left>right. He was found to have high blood pressure of 189/117, increased Creatinine, BUN and BNP and was admitted for possible hypertensive emergency. He was given 1 dose of amlodipine and started on carvedilol. Cardiology was consulted and gave patient one dose of lasix. ECHOCardiogram was checked. Patient is improved and will likely be D/C'd in AM if stable.   Assessment & Plan:   Active Problems:   DM (diabetes mellitus), type 2 (HCC)   HLD (hyperlipidemia)   Hypertensive urgency   AKI (acute kidney injury) ([place])   Fluid overload  Hypertensive Emergency/Uncontrolled HTN:  -Most recent BP this AM was 173/97 -ECHOCardiogram showed EF of 55-60% -EKG showed LVH but negative for STEMI -Carvedilol increased to 25 mg po BID by Cardiology -Amlodipine increased to 10 mg Daily today  -Continue Hydralazine 10 mg IV q4hPRN for SBP>180 -C/w Labetalol 10 mg IV q2hprn for SB -Cardiology followed and appreciated Recc's.  DM (diabetes mellitus), type 2 ([place]):  -Patient eating well, without any GI symptoms or fatigue.  -Hgb A1c was 5.8  -C/w Sensitive Novolog SSI AC/HS -CBG's have been ranging from 125-165 -Continue to Monitor CBG's   AKI (acute kidney injury) ([place]) likely on CKD Stage 3:  -Renal U.S. showed mildly increased renal cortical echotexture bilaterally, consistent with renal disease. -BUN/Cr went from 21/2.27 -> 25/2.56 -Per Cardiology Avoid ACE/ARB and Cardiology not giving Lasix today -Will likely need to see Nephrology as an outpatient as HTN and Diabetes likely contributed to Renal  Disease  -Continue to Monitor and repeat CMP in AM  Hypomagnesemia -Patient's Mag level was 1.3 -Replete with 2 grams of IV Mag Sulfate -Continue to Monitor Mag and Replete as Necessary -Repeat Mag Level in AM  HLD (Hyperlipidemia):  -Lipid Panel showed Cholesterol of 195, HDL of 32, LDL of 139, TG of 119 and VLDL of 24 -Started statin therapy with Atorvastatin 20 mg po qHS.  Mildly Elevated Troponin -Troponin was 0.08 -likely from HTN and Demand Ischemia -Not consistent with ACS as Troponin are Flat  Volume Overload/Intersitial Edema -Likely from Hypertensive Emergency -Strict I's and O's, Daily Weights -BNP was elevated on Admission at 483.7 -> 629 -Got one Dose of IV Lasix 40 mg yesterday and per Cardiology no more Lasix at this time as Cr increased -ECHO showed EF of 55-60% -Patient is + 480 mL but down 12 lbs since admit -Continue to Monitor and follow up as an outpatient   DVT prophylaxis: Enoxaparin 40 mg sq q24h Code Status: FULL CODE Family Communication: Discussed with wife at bedside Disposition Plan: Likely D/C tomorrow AM  Consultants:   Cardiology Dr. Candee Furbish   Procedures:  ECHOCARDIOGRAM Study Conclusions  - Left ventricle: The cavity size was normal. Systolic function was   normal. The estimated ejection fraction was in the range of 55%   to 60%. Wall motion was normal; there were no regional wall   motion abnormalities. The study is not technically sufficient to   allow evaluation of LV diastolic function. - Aortic valve: Transvalvular velocity was within the normal range.   There was no stenosis. There was no regurgitation. -  Mitral valve: Transvalvular velocity was within the normal range.   There was no evidence for stenosis. There was trivial   regurgitation. - Left atrium: The atrium was severely dilated. - Right ventricle: The cavity size was normal. Wall thickness was   normal. Systolic function was normal. - Atrial septum: No defect  or patent foramen ovale was identified   by color flow Doppler. - Tricuspid valve: There was no regurgitation.   Antimicrobials:  Anti-infectives    None     Subjective: Seen and examined and was feeling good. Had no CP, SOB, N, or Vomiting. No lightheadedness or dizziness. No other complaints or concerns.   Objective: Vitals:   12/12/16 2140 12/13/16 0200 12/13/16 0249 12/13/16 0607  BP: (!) 152/83 (!) 164/85 (!) 160/83 (!) 173/97  Pulse: (!) 105 (!) 102 (!) 101 96  Resp:  18  17  Temp:  99.1 F (37.3 C)  99.1 F (37.3 C)  TempSrc:  Oral  Oral  SpO2:  97%  98%  Weight:    101.4 kg (223 lb 9.6 oz)  Height:        Intake/Output Summary (Last 24 hours) at 12/13/16 0803 Last data filed at 12/13/16 0600  Gross per 24 hour  Intake              240 ml  Output                0 ml  Net              240 ml   Filed Weights   12/11/16 1459 12/12/16 0249 12/13/16 0607  Weight: 106.6 kg (235 lb) 104 kg (229 lb 3.2 oz) 101.4 kg (223 lb 9.6 oz)   Examination: Physical Exam:  Constitutional:  NAD and appears calm and comfortable Eyes: Lids and conjunctivae normal, sclerae anicteric  ENMT: External Ears, Nose appear normal. Grossly normal hearing. Mucous membranes are moist.  Neck: Appears normal, supple, no cervical masses, normal ROM, no appreciable thyromegaly, no visible JVD Respiratory: Diminished to auscultation bilaterally, no wheezing, rales, rhonchi or crackles. Normal respiratory effort and patient is not tachypenic. No accessory muscle use.  Cardiovascular: RRR, no murmurs / rubs / gallops. S1 and S2 auscultated. Trace extremity edema. Abdomen: Soft, non-tender, non-distended. No masses palpated. No appreciable hepatosplenomegaly. Bowel sounds positive x4.  GU: Deferred. Musculoskeletal: No clubbing / cyanosis of digits/nails. No joint deformity upper and lower extremities.  Skin: No rashes, lesions, ulcers on a limited skin evaluation. No induration; Warm and dry.    Neurologic: CN 2-12 grossly intact with no focal deficits. Romberg sign cerebellar reflexes not assessed.  Psychiatric: Normal judgment and insight. Alert and oriented x 3. Normal mood and appropriate affect.   Data Reviewed: I have personally reviewed following labs and imaging studies  CBC:  Recent Labs Lab 12/11/16 1953 12/12/16 0441 12/13/16 0701  WBC 7.1 9.6 9.9  NEUTROABS 4.5  --  7.2  HGB 11.4* 11.4* 12.3*  HCT 32.9* 32.2* 35.1*  MCV 85.5 84.3 84.0  PLT 245 225 102   Basic Metabolic Panel:  Recent Labs Lab 12/11/16 1953 12/12/16 0441  NA 139 138  K 3.7 3.7  CL 106 105  CO2 25 25  GLUCOSE 110* 155*  BUN 22* 21*  CREATININE 2.25* 2.27*  CALCIUM 8.8* 8.6*  MG  --  1.4*  PHOS  --  2.5   GFR: Estimated Creatinine Clearance: 51.4 mL/min (A) (by C-G formula based on SCr of 2.27 mg/dL (H)).  Liver Function Tests:  Recent Labs Lab 12/12/16 0441  AST 36  ALT 21  ALKPHOS 61  BILITOT 0.6  PROT 7.0  ALBUMIN 3.0*   No results for input(s): LIPASE, AMYLASE in the last 168 hours. No results for input(s): AMMONIA in the last 168 hours. Coagulation Profile: No results for input(s): INR, PROTIME in the last 168 hours. Cardiac Enzymes:  Recent Labs Lab 12/11/16 2213 12/12/16 0441 12/12/16 1109  TROPONINI 0.06* 0.08* 0.08*   BNP (last 3 results) No results for input(s): PROBNP in the last 8760 hours. HbA1C:  Recent Labs  12/12/16 0441  HGBA1C 5.8*   CBG:  Recent Labs Lab 12/12/16 0736 12/12/16 1133 12/12/16 1718 12/12/16 2036  GLUCAP 122* 135* 165* 139*   Lipid Profile:  Recent Labs  12/12/16 0441  CHOL 195  HDL 32*  LDLCALC 139*  TRIG 119  CHOLHDL 6.1   Thyroid Function Tests:  Recent Labs  12/12/16 0441  TSH 0.826   Anemia Panel: No results for input(s): VITAMINB12, FOLATE, FERRITIN, TIBC, IRON, RETICCTPCT in the last 72 hours. Sepsis Labs: No results for input(s): PROCALCITON, LATICACIDVEN in the last 168 hours.  No  results found for this or any previous visit (from the past 240 hour(s)).   Radiology Studies: Dg Chest 2 View  Result Date: 12/11/2016 CLINICAL DATA:  Acute onset of bilateral leg swelling, worse on the left. High blood pressure. Dizziness. Initial encounter. EXAM: CHEST  2 VIEW COMPARISON:  None. FINDINGS: The lungs are well-aerated. Mild vascular congestion is noted. Mildly increased interstitial markings likely reflect mild interstitial edema. Peribronchial thickening is seen. There is no evidence of pleural effusion or pneumothorax. The heart is normal in size; the mediastinal contour is within normal limits. No acute osseous abnormalities are seen. IMPRESSION: Mild vascular congestion. Mildly increased interstitial markings likely reflect mild interstitial edema. Peribronchial thickening noted. Electronically Signed   By: Garald Balding M.D.   On: 12/11/2016 20:10   Ct Head Wo Contrast  Result Date: 12/12/2016 CLINICAL DATA:  Headache with nausea and vomiting.  Hypertension. EXAM: CT HEAD WITHOUT CONTRAST TECHNIQUE: Contiguous axial images were obtained from the base of the skull through the vertex without intravenous contrast. COMPARISON:  None. FINDINGS: Brain: The ventricles are normal in size and configuration. There is no intracranial mass, hemorrhage, extra-axial fluid collection, or midline shift. Gray-white compartments are normal. No acute infarct evident. Vascular: No hyperdense vessel. There is mild calcification in the cavernous carotid arteries bilaterally. Skull: Bony calvarium appears intact. Sinuses/Orbits: There is opacification in multiple ethmoid air cells bilaterally. Other visualized paranasal sinuses are clear. Orbits appear symmetric bilaterally. Other: Mastoid air cells are clear. IMPRESSION: No intracranial mass, hemorrhage, or extra-axial fluid collection. Gray-white compartments appear normal. Slight cavernous carotid artery calcification bilaterally. Opacification noted in  multiple ethmoid air cells. Electronically Signed   By: Lowella Grip III M.D.   On: 12/12/2016 14:00   US Renal  Result Date: 12/12/2016 CLINICAL DATA:  Renal failure, history of diabetes and hypertension EXAM: RENAL / URINARY TRACT ULTRASOUND COMPLETE COMPARISON:  None in PACs FINDINGS: Right Kidney: Length: 12.6 cm. The renal cortical echotexture is approximately equal to that of the adjacent liver. There is no hydronephrosis. There is no solid mass. There is a simple appearing cyst in the lower pole cortex measuring 1.3 cm in diameter. Left Kidney: Length: 11.7 cm. The renal cortical echotexture is similar to that on the right. There is no focal mass or hydronephrosis. Bladder: Appears normal for degree of  bladder distention. The prostate gland is enlarged measuring 5.6 cm transversely by 3.1 cm longitudinally by 5.2 cm AP. It produces a mild impression upon the urinary bladder base. IMPRESSION: Mildly increased renal cortical echotexture bilaterally is consistent with medical renal disease. There is no hydronephrosis. Mild prostate enlargement. Otherwise normal appearing urinary bladder. Electronically Signed   By: David  Martinique M.D.   On: 12/12/2016 08:34   Scheduled Meds: . amLODipine  5 mg Oral Daily  . carvedilol  6.25 mg Oral BID WC  . enoxaparin (LOVENOX) injection  40 mg Subcutaneous Q24H  . insulin aspart  0-5 Units Subcutaneous QHS  . insulin aspart  0-9 Units Subcutaneous TID WC  . sodium chloride flush  3 mL Intravenous Q12H  . sodium chloride flush  3 mL Intravenous Q12H   Continuous Infusions: . sodium chloride       LOS: 1 day   Kerney Elbe, DO [facility] Pager [phone]  If 7PM-7AM, please contact night-coverage [email or web address] Password [email or web address] 12/13/2016, 8:03 AM

## 2016-12-13 NOTE — Progress Notes (Addendum)
Progress Note  Patient Name: Corey Park Date of Encounter: 12/13/2016  Primary Cardiologist: None  Subjective   Blood pressure still elevated 173/97 this morning. Echo from yesterday shows EF 55-60% and no wall motion abnormalities. He is feeling much better and ready to go home.   He did have a few episodes of tachycardia on telemetry HR < 100.  Inpatient Medications    Scheduled Meds: . amLODipine  10 mg Oral Daily  . carvedilol  6.25 mg Oral BID WC  . enoxaparin (LOVENOX) injection  40 mg Subcutaneous Q24H  . insulin aspart  0-5 Units Subcutaneous QHS  . insulin aspart  0-9 Units Subcutaneous TID WC  . sodium chloride flush  3 mL Intravenous Q12H  . sodium chloride flush  3 mL Intravenous Q12H   Continuous Infusions: . sodium chloride    . magnesium sulfate 1 - 4 g bolus IVPB     PRN Meds: sodium chloride, acetaminophen **OR** acetaminophen, hydrALAZINE, labetalol, ondansetron **OR** ondansetron (ZOFRAN) IV, sodium chloride flush   Vital Signs    Vitals:   12/12/16 2140 12/13/16 0200 12/13/16 0249 12/13/16 0607  BP: (!) 152/83 (!) 164/85 (!) 160/83 (!) 173/97  Pulse: (!) 105 (!) 102 (!) 101 96  Resp:  18  17  Temp:  99.1 F (37.3 C)  99.1 F (37.3 C)  TempSrc:  Oral  Oral  SpO2:  97%  98%  Weight:    223 lb 9.6 oz (101.4 kg)  Height:        Intake/Output Summary (Last 24 hours) at 12/13/16 0941 Last data filed at 12/13/16 0849  Gross per 24 hour  Intake              360 ml  Output                0 ml  Net              360 ml   Filed Weights   12/11/16 1459 12/12/16 0249 12/13/16 0607  Weight: 235 lb (106.6 kg) 229 lb 3.2 oz (104 kg) 223 lb 9.6 oz (101.4 kg)    Telemetry     HR 90s, multiple episodes of tachycadia- Personally Reviewed   Physical Exam   GEN: Well nourished, well developed HEENT: normal  Neck: no JVD, carotid bruits, or masses Cardiac: RRR. no murmurs, rubs, or gallops,no edema. Intact distal pulses bilaterally.    Respiratory: clear to auscultation bilaterally, normal work of breathing GI: soft, nontender, nondistended, + BS MS: no deformity or atrophy  Skin: warm and dry, no rash Neuro: Alert and Oriented x 3, Strength and sensation are intact Psych:   Full affect  Labs    Chemistry Recent Labs Lab 12/11/16 1953 12/12/16 0441 12/13/16 0701  NA 139 138 137  K 3.7 3.7 3.7  CL 106 105 101  CO2 25 25 26   GLUCOSE 110* 155* 124*  BUN 22* 21* 25*  CREATININE 2.25* 2.27* 2.56*  CALCIUM 8.8* 8.6* 8.5*  PROT  --  7.0  --   ALBUMIN  --  3.0*  --   AST  --  36  --   ALT  --  21  --   ALKPHOS  --  61  --   BILITOT  --  0.6  --   GFRNONAA 33* 33* 29*  GFRAA 39* 38* 33*  ANIONGAP 8 8 10      Hematology Recent Labs Lab 12/11/16 1953 12/12/16 0441 12/13/16 0701  WBC  7.1 9.6 9.9  RBC 3.85* 3.82* 4.18*  HGB 11.4* 11.4* 12.3*  HCT 32.9* 32.2* 35.1*  MCV 85.5 84.3 84.0  MCH 29.6 29.8 29.4  MCHC 34.7 35.4 35.0  RDW 13.2 13.0 13.3  PLT 245 225 211    Cardiac Enzymes Recent Labs Lab 12/11/16 2213 12/12/16 0441 12/12/16 1109  TROPONINI 0.06* 0.08* 0.08*    Recent Labs Lab 12/11/16 2004  TROPIPOC 0.05     BNP Recent Labs Lab 12/11/16 1953 12/12/16 0441 12/13/16 0701  BNP 261.6* 483.7* 629.0*     DDimer No results for input(s): DDIMER in the last 168 hours.   Radiology    Dg Chest 2 View  Result Date: 12/11/2016 CLINICAL DATA:  Acute onset of bilateral leg swelling, worse on the left. High blood pressure. Dizziness. Initial encounter. EXAM: CHEST  2 VIEW COMPARISON:  None. FINDINGS: The lungs are well-aerated. Mild vascular congestion is noted. Mildly increased interstitial markings likely reflect mild interstitial edema. Peribronchial thickening is seen. There is no evidence of pleural effusion or pneumothorax. The heart is normal in size; the mediastinal contour is within normal limits. No acute osseous abnormalities are seen. IMPRESSION: Mild vascular congestion.  Mildly increased interstitial markings likely reflect mild interstitial edema. Peribronchial thickening noted. Electronically Signed   By: Garald Balding M.D.   On: 12/11/2016 20:10   Ct Head Wo Contrast  Result Date: 12/12/2016 CLINICAL DATA:  Headache with nausea and vomiting.  Hypertension. EXAM: CT HEAD WITHOUT CONTRAST TECHNIQUE: Contiguous axial images were obtained from the base of the skull through the vertex without intravenous contrast. COMPARISON:  None. FINDINGS: Brain: The ventricles are normal in size and configuration. There is no intracranial mass, hemorrhage, extra-axial fluid collection, or midline shift. Gray-white compartments are normal. No acute infarct evident. Vascular: No hyperdense vessel. There is mild calcification in the cavernous carotid arteries bilaterally. Skull: Bony calvarium appears intact. Sinuses/Orbits: There is opacification in multiple ethmoid air cells bilaterally. Other visualized paranasal sinuses are clear. Orbits appear symmetric bilaterally. Other: Mastoid air cells are clear. IMPRESSION: No intracranial mass, hemorrhage, or extra-axial fluid collection. Gray-white compartments appear normal. Slight cavernous carotid artery calcification bilaterally. Opacification noted in multiple ethmoid air cells. Electronically Signed   By: Lowella Grip III M.D.   On: 12/12/2016 14:00   US Renal  Result Date: 12/12/2016 CLINICAL DATA:  Renal failure, history of diabetes and hypertension EXAM: RENAL / URINARY TRACT ULTRASOUND COMPLETE COMPARISON:  None in PACs FINDINGS: Right Kidney: Length: 12.6 cm. The renal cortical echotexture is approximately equal to that of the adjacent liver. There is no hydronephrosis. There is no solid mass. There is a simple appearing cyst in the lower pole cortex measuring 1.3 cm in diameter. Left Kidney: Length: 11.7 cm. The renal cortical echotexture is similar to that on the right. There is no focal mass or hydronephrosis. Bladder: Appears  normal for degree of bladder distention. The prostate gland is enlarged measuring 5.6 cm transversely by 3.1 cm longitudinally by 5.2 cm AP. It produces a mild impression upon the urinary bladder base. IMPRESSION: Mildly increased renal cortical echotexture bilaterally is consistent with medical renal disease. There is no hydronephrosis. Mild prostate enlargement. Otherwise normal appearing urinary bladder. Electronically Signed   By: David  Martinique M.D.   On: 12/12/2016 08:34    Cardiac Studies   2D echocardiogram (12/12/2016)  Study Conclusions  - Left ventricle: The cavity size was normal. Systolic function was   normal. The estimated ejection fraction was in the range  of 55%   to 60%. Wall motion was normal; there were no regional wall   motion abnormalities. The study is not technically sufficient to   allow evaluation of LV diastolic function. - Aortic valve: Transvalvular velocity was within the normal range.   There was no stenosis. There was no regurgitation. - Mitral valve: Transvalvular velocity was within the normal range.   There was no evidence for stenosis. There was trivial   regurgitation. - Left atrium: The atrium was severely dilated. - Right ventricle: The cavity size was normal. Wall thickness was   normal. Systolic function was normal. - Atrial septum: No defect or patent foramen ovale was identified   by color flow Doppler. - Tricuspid valve: There was no regurgitation.   Patient Profile   Corey Park is a 46 year old male. He tells me that he has not been prescribed HTN or fluid pills ever before, this is inconsistent with what he has told previous providers. He said that he has not seen any providers for a very long time unless its urgent care. Reports getting diabetes medications through pharmacy but has not taken them in months.  He presented to the ER for bilateral lower extremity swelling and some general fatigue for the past 4 days. He recently started  working a new job that requires prolonged standing [employer]). No orthopnea, chest pain, N/V, DOE prior to arrival.   Assessment & Plan     Hypertensive emergency: Blood pressure still running high this AM. Was given a 1 time dose of lasix 40mg  but did not diurese: creatinine elevated from 2.27 to 2.56, will not reorder IV lasix at this time. His echo shows EF of 55-60% patient feeling much better and wants to go home. I would like to be more aggressive in controlling his blood pressure  Add scheduled Hydralazine IV 10 mg q 4 hours for better control (will discuss with Dr. Marlou Porch before ordering this) --   Avoid ACE/ARB due to kidney dysfunction. --- Coreg increased to 6.25 mg yesterday  --- Amlodipine is at 10 mg. He received on dose of Hydralazine injection 10 mg  Anemia/CKD 3: creatinine function worse today, will hold off on lasix.  Demand ischemia: mildly elevated/flat troponin. His presentation is not consistent with ACS.   Signed, Linus Mako, PA-C  12/13/2016, 9:41 AM    Candee Furbish, MD  Feels great, no CP, no SOB  Uncontrolled HTN  - I will increase coreg to 25mg  PO BID (will give 6.25 dose now)  - (occasionally labetalol is an alternative that is helpful for HTN)   CKD 3  - likely chronic from HTN  - may wish to have him see nephrology as outpatient  - avoid ace  Demand ischemia   - trop low flat from HTN  No CP, CTAB, no edema  Will sign off. Further BP control per Dr. Alfredia Ferguson. Likely DC tomorrow Please have him establish with PCP (case mgt consult in place)  Candee Furbish, MD

## 2016-12-14 DIAGNOSIS — I161 Hypertensive emergency: Secondary | ICD-10-CM | POA: Diagnosis not present

## 2016-12-14 DIAGNOSIS — E1129 Type 2 diabetes mellitus with other diabetic kidney complication: Secondary | ICD-10-CM | POA: Diagnosis not present

## 2016-12-14 DIAGNOSIS — N179 Acute kidney failure, unspecified: Secondary | ICD-10-CM | POA: Diagnosis not present

## 2016-12-14 DIAGNOSIS — I10 Essential (primary) hypertension: Secondary | ICD-10-CM | POA: Diagnosis not present

## 2016-12-14 LAB — CBC WITH DIFFERENTIAL/PLATELET
BASOS PCT: 0 %
Basophils Absolute: 0 10*3/uL (ref 0.0–0.1)
Eosinophils Absolute: 0.1 10*3/uL (ref 0.0–0.7)
Eosinophils Relative: 2 %
HEMATOCRIT: 32.4 % — AB (ref 39.0–52.0)
Hemoglobin: 11.2 g/dL — ABNORMAL LOW (ref 13.0–17.0)
Lymphocytes Relative: 24 %
Lymphs Abs: 1.3 10*3/uL (ref 0.7–4.0)
MCH: 28.7 pg (ref 26.0–34.0)
MCHC: 34.6 g/dL (ref 30.0–36.0)
MCV: 83.1 fL (ref 78.0–100.0)
MONO ABS: 0.9 10*3/uL (ref 0.1–1.0)
MONOS PCT: 15 %
NEUTROS ABS: 3.3 10*3/uL (ref 1.7–7.7)
Neutrophils Relative %: 59 %
Platelets: 195 10*3/uL (ref 150–400)
RBC: 3.9 MIL/uL — ABNORMAL LOW (ref 4.22–5.81)
RDW: 13 % (ref 11.5–15.5)
WBC: 5.6 10*3/uL (ref 4.0–10.5)

## 2016-12-14 LAB — COMPREHENSIVE METABOLIC PANEL
ALBUMIN: 2.6 g/dL — AB (ref 3.5–5.0)
ALT: 17 U/L (ref 17–63)
ANION GAP: 8 (ref 5–15)
AST: 25 U/L (ref 15–41)
Alkaline Phosphatase: 48 U/L (ref 38–126)
BILIRUBIN TOTAL: 0.6 mg/dL (ref 0.3–1.2)
BUN: 28 mg/dL — ABNORMAL HIGH (ref 6–20)
CO2: 27 mmol/L (ref 22–32)
Calcium: 8.1 mg/dL — ABNORMAL LOW (ref 8.9–10.3)
Chloride: 103 mmol/L (ref 101–111)
Creatinine, Ser: 2.61 mg/dL — ABNORMAL HIGH (ref 0.61–1.24)
GFR, EST AFRICAN AMERICAN: 32 mL/min — AB (ref >60)
GFR, EST NON AFRICAN AMERICAN: 28 mL/min — AB (ref >60)
GLUCOSE: 118 mg/dL — AB (ref 65–99)
POTASSIUM: 3.7 mmol/L (ref 3.5–5.1)
Sodium: 138 mmol/L (ref 135–145)
TOTAL PROTEIN: 6.3 g/dL — AB (ref 6.5–8.1)

## 2016-12-14 LAB — GLUCOSE, CAPILLARY
GLUCOSE-CAPILLARY: 134 mg/dL — AB (ref 65–99)
GLUCOSE-CAPILLARY: 135 mg/dL — AB (ref 65–99)

## 2016-12-14 LAB — PHOSPHORUS: PHOSPHORUS: 3.7 mg/dL (ref 2.5–4.6)

## 2016-12-14 LAB — MAGNESIUM: Magnesium: 1.8 mg/dL (ref 1.7–2.4)

## 2016-12-14 MED ORDER — ATORVASTATIN CALCIUM 20 MG PO TABS: 20.0000 mg | ORAL_TABLET | Freq: Every day | ORAL | 0 refills | Status: DC

## 2016-12-14 MED ORDER — CARVEDILOL 25 MG PO TABS: 25.0000 mg | ORAL_TABLET | Freq: Two times a day (BID) | ORAL | 0 refills | Status: DC

## 2016-12-14 MED ORDER — AMLODIPINE BESYLATE 10 MG PO TABS: 10.0000 mg | ORAL_TABLET | Freq: Every day | ORAL | 0 refills | Status: DC

## 2016-12-14 MED ORDER — BLOOD GLUCOSE MONITOR KIT: PACK | 0 refills | Status: DC

## 2016-12-14 NOTE — Discharge Summary (Signed)
Physician Discharge Summary  Rhyatt Muska GGY:[id number] DOB: 04/19/1971 DOA: 12/11/2016  PCP: Patient, No Pcp Per  Admit date: 12/11/2016 Discharge date: 12/14/2016  Admitted From: Home Disposition:  Home  Recommendations for Outpatient Follow-up:  1. Follow up with PCP in 1-2 weeks on July 3rd, 2018 at 10:30 AM 2. Follow up with Nephrology Dr. Jamal Maes on December 26, 2016 at 2:30 pm 3. Please obtain CMP/CBC, Mag, Phos in one week 4. Please follow up on the following pending results:  Home Health: No Equipment/Devices: None  Discharge Condition: Stable  CODE STATUS: FULL CODE Diet recommendation: Heart Healthy Carb Modified Diet  Brief/Interim Summary: Patient is a 46 y.o.malewith past medical history of uncontrolled hypertension and uncontrolled type II diabetes. He doesn't take any medications at home and does not monitor his glucose levels. He presented to the ED last night for bilateral lower extremity edema, left>right. He was found to have high blood pressure of 189/117, increased Creatinine, BUN and BNP and was admitted for possible hypertensive emergency. He was given 1 dose of amlodipine and started on carvedilol. Cardiology was consulted and gave patient one dose of lasix. ECHOCardiogram was checked and showed a normal EF of 55-60%. Patient is improved and was deemed medically stable to follow up with PCP and Nephrology. Appointments were made for him and he will need repeat blood work in 1 week.  Discharge Diagnoses:  Active Problems:   DM (diabetes mellitus), type 2 (HCC)   HLD (hyperlipidemia)   Hypertensive urgency   AKI (acute kidney injury) (HCC)   Fluid overload   Hypomagnesemia  Hypertensive Emergency/Uncontrolled HTN, improving  -Most recent BP this AM was 148/78 -ECHOCardiogram showed EF of 55-60% -EKG showed LVH but negative for STEMI -Carvedilol increased to 25 mg po BID by Cardiology and will continue at D/C -Amlodipine increased to 10 mg Daily and  will continue at D/C -Cardiology followed and appreciated Recc's. -Follow up with PCP as an outpatient   DM (diabetes mellitus), type 2 ([place]): -Patient eating well, without any GI symptoms or fatigue.  -Hgb A1c was 5.8  -C/w Sensitive Novolog SSI AC/HS -CBG's have been ranging from 111-135 -Continue to Monitor as an outpatient with PCP  AKI (acute kidney injury) ([place]) likely on CKD Stage 3:  -Renal U.S. showed mildly increased renal cortical echotexture bilaterally, consistent with renal disease. -BUN/Cr went from 21/2.27 -> 25/2.56 -> 28/2.61 -Per Cardiology Avoid ACE/ARB and Cardiology not giving Lasix today -Will likely need to see Nephrology as an outpatient as HTN and Diabetes likely contributed to Renal Disease  -Patient has an appointment with Dr. Jamal Maes of [facility] on December 26, 2016 at 2:30 pm -Continue to Monitor and repeat CMP as an outpatient  Hypomagnesemia -Patient's Mag level was 1.3 and improved to 1.8 -Replete with 2 grams of IV Mag Sulfate -Continue to Monitor Mag and Replete as Necessary -Repeat Mag Level as an outpatient   HLD (Hyperlipidemia): -Lipid Panel showed Cholesterol of 195, HDL of 32, LDL of 139, TG of 119 and VLDL of 24 -Started statin therapy with Atorvastatin 20 mg po qHS.  Mildly Elevated Troponin -Troponin was 0.08 -likely from HTN and Demand Ischemia -Not consistent with ACS as Troponin are Flat  Volume Overload/Intersitial Edema -Likely from Hypertensive Emergency -Strict I's and O's, Daily Weights -BNP was elevated on Admission at 483.7 -> 629 -Got one Dose of IV Lasix 40 mg yesterday and per Cardiology no more Lasix at this time as Cr increased -ECHO showed EF  of 55-60% -Patient is + 960 mL but down 13 lbs since admit -Continue to Monitor and follow up as an outpatient   Discharge Instructions  Discharge Instructions    Call MD for:  difficulty breathing, headache or visual disturbances    Complete by:   As directed    Call MD for:  extreme fatigue    Complete by:  As directed    Call MD for:  persistant dizziness or light-headedness    Complete by:  As directed    Call MD for:  persistant nausea and vomiting    Complete by:  As directed    Call MD for:  severe uncontrolled pain    Complete by:  As directed    Call MD for:  temperature >100.4    Complete by:  As directed    Diet - low sodium heart healthy    Complete by:  As directed    Discharge instructions    Complete by:  As directed    Follow up with PCP and with Nephrology as an outpatient at scheduled appointments. Take all medications as prescribed. If symptoms change or worsen please return to the ED for evaluation.   Increase activity slowly    Complete by:  As directed      Allergies as of 12/14/2016   No Known Allergies     Medication List    TAKE these medications   albuterol 108 (90 Base) MCG/ACT inhaler Commonly known as:  PROVENTIL HFA;VENTOLIN HFA Inhale 2 puffs into the lungs every 4 (four) hours as needed for wheezing or shortness of breath.   amLODipine 10 MG tablet Commonly known as:  NORVASC Take 1 tablet (10 mg total) by mouth daily.   atorvastatin 20 MG tablet Commonly known as:  LIPITOR Take 1 tablet (20 mg total) by mouth daily at 6 PM.   blood glucose meter kit and supplies Kit Dispense based on patient and insurance preference. Use up to four times daily as directed. (FOR ICD-9 250.00, 250.01).   carvedilol 25 MG tablet Commonly known as:  COREG Take 1 tablet (25 mg total) by mouth 2 (two) times daily with a meal.      Follow-up Information    [facility] Follow up on 12/26/2016.   Specialty:  Internal Medicine Why:  appointment July 3 at 1030 AM. Please keep this appointment. Contact information: [place]       Jamal Maes, MD. Go on 12/26/2016.   Specialty:  Nephrology Why:  July 3rd at 2:30 pm for New  Patient Nephrology Follow Up.  Contact information: [place] [address] [phone]          No Known Allergies  Consultations:  Cardiology Dr. Marlou Porch  Procedures/Studies: Dg Chest 2 View  Result Date: 12/11/2016 CLINICAL DATA:  Acute onset of bilateral leg swelling, worse on the left. High blood pressure. Dizziness. Initial encounter. EXAM: CHEST  2 VIEW COMPARISON:  None. FINDINGS: The lungs are well-aerated. Mild vascular congestion is noted. Mildly increased interstitial markings likely reflect mild interstitial edema. Peribronchial thickening is seen. There is no evidence of pleural effusion or pneumothorax. The heart is normal in size; the mediastinal contour is within normal limits. No acute osseous abnormalities are seen. IMPRESSION: Mild vascular congestion. Mildly increased interstitial markings likely reflect mild interstitial edema. Peribronchial thickening noted. Electronically Signed   By: Garald Balding M.D.   On: 12/11/2016 20:10  Ct Head Wo Contrast  Result Date: 12/12/2016 CLINICAL DATA:  Headache with nausea and vomiting.  Hypertension. EXAM: CT HEAD WITHOUT CONTRAST TECHNIQUE: Contiguous axial images were obtained from the base of the skull through the vertex without intravenous contrast. COMPARISON:  None. FINDINGS: Brain: The ventricles are normal in size and configuration. There is no intracranial mass, hemorrhage, extra-axial fluid collection, or midline shift. Gray-white compartments are normal. No acute infarct evident. Vascular: No hyperdense vessel. There is mild calcification in the cavernous carotid arteries bilaterally. Skull: Bony calvarium appears intact. Sinuses/Orbits: There is opacification in multiple ethmoid air cells bilaterally. Other visualized paranasal sinuses are clear. Orbits appear symmetric bilaterally. Other: Mastoid air cells are clear. IMPRESSION: No intracranial mass, hemorrhage, or extra-axial fluid collection. Gray-white  compartments appear normal. Slight cavernous carotid artery calcification bilaterally. Opacification noted in multiple ethmoid air cells. Electronically Signed   By: Lowella Grip III M.D.   On: 12/12/2016 14:00   US Renal  Result Date: 12/12/2016 CLINICAL DATA:  Renal failure, history of diabetes and hypertension EXAM: RENAL / URINARY TRACT ULTRASOUND COMPLETE COMPARISON:  None in PACs FINDINGS: Right Kidney: Length: 12.6 cm. The renal cortical echotexture is approximately equal to that of the adjacent liver. There is no hydronephrosis. There is no solid mass. There is a simple appearing cyst in the lower pole cortex measuring 1.3 cm in diameter. Left Kidney: Length: 11.7 cm. The renal cortical echotexture is similar to that on the right. There is no focal mass or hydronephrosis. Bladder: Appears normal for degree of bladder distention. The prostate gland is enlarged measuring 5.6 cm transversely by 3.1 cm longitudinally by 5.2 cm AP. It produces a mild impression upon the urinary bladder base. IMPRESSION: Mildly increased renal cortical echotexture bilaterally is consistent with medical renal disease. There is no hydronephrosis. Mild prostate enlargement. Otherwise normal appearing urinary bladder. Electronically Signed   By: David  Martinique M.D.   On: 12/12/2016 08:34    ECHOCARDIOGRAM Study Conclusions  - Left ventricle: The cavity size was normal. Systolic function was   normal. The estimated ejection fraction was in the range of 55%   to 60%. Wall motion was normal; there were no regional wall   motion abnormalities. The study is not technically sufficient to   allow evaluation of LV diastolic function. - Aortic valve: Transvalvular velocity was within the normal range.   There was no stenosis. There was no regurgitation. - Mitral valve: Transvalvular velocity was within the normal range.   There was no evidence for stenosis. There was trivial   regurgitation. - Left atrium: The atrium  was severely dilated. - Right ventricle: The cavity size was normal. Wall thickness was   normal. Systolic function was normal. - Atrial septum: No defect or patent foramen ovale was identified   by color flow Doppler. - Tricuspid valve: There was no regurgitation.  Subjective: Seen and examined at bedside and felt better. No nausea or vomiting. Wanted to go home. Strongly advised he stay compliant with his medications and follow up appointments.  Discharge Exam: Vitals:   12/13/16 2331 12/14/16 0442  BP: (!) 148/82 (!) 147/78  Pulse: 83 82  Resp: 19 16  Temp:  98.6 F (37 C)   Vitals:   12/13/16 1749 12/13/16 2036 12/13/16 2331 12/14/16 0442  BP:  (!) 148/85 (!) 148/82 (!) 147/78  Pulse: 97 88 83 82  Resp:   19 16  Temp:  99 F (37.2 C) 99 F (37.2 C) 98.6 F (37 C)  TempSrc:  Oral Oral Oral  SpO2:  96% 99% 100%  Weight:    101 kg (222 lb 10.6 oz)  Height:    6' 1" (1.854 m)   General: Pt is alert, awake, not in acute distress Cardiovascular: RRR, S1/S2 +, no rubs, no gallops Respiratory: CTA bilaterally, no wheezing, no rhonchi Abdominal: Soft, NT, ND, bowel sounds + Extremities: Minimal edema, no cyanosis  The results of significant diagnostics from this hospitalization (including imaging, microbiology, ancillary and laboratory) are listed below for reference.    Microbiology: No results found for this or any previous visit (from the past 240 hour(s)).   Labs: BNP (last 3 results)  Recent Labs  12/11/16 1953 12/12/16 0441 12/13/16 0701  BNP 261.6* 483.7* 412.8*   Basic Metabolic Panel:  Recent Labs Lab 12/11/16 1953 12/12/16 0441 12/13/16 0701 12/14/16 0551  NA 139 138 137 138  K 3.7 3.7 3.7 3.7  CL 106 105 101 103  CO2 _0 GLUCOSE 110* 155* 124* 118*  BUN 22* 21* 25* 28*  CREATININE 2.25* 2.27* 2.56* 2.61*  CALCIUM 8.8* 8.6* 8.5* 8.1*  MG  --  1.4* 1.3* 1.8  PHOS  --  2.5 2.9 3.7   Liver Function Tests:  Recent Labs Lab  12/12/16 0441 12/14/16 0551  AST 36 25  ALT 21 17  ALKPHOS 61 48  BILITOT 0.6 0.6  PROT 7.0 6.3*  ALBUMIN 3.0* 2.6*   No results for input(s): LIPASE, AMYLASE in the last 168 hours. No results for input(s): AMMONIA in the last 168 hours. CBC:  Recent Labs Lab 12/11/16 1953 12/12/16 0441 12/13/16 0701 12/14/16 0551  WBC 7.1 9.6 9.9 5.6  NEUTROABS 4.5  --  7.2 3.3  HGB 11.4* 11.4* 12.3* 11.2*  HCT 32.9* 32.2* 35.1* 32.4*  MCV 85.5 84.3 84.0 83.1  PLT 245 225 211 195   Cardiac Enzymes:  Recent Labs Lab 12/11/16 2213 12/12/16 0441 12/12/16 1109  TROPONINI 0.06* 0.08* 0.08*   BNP: Invalid input(s): POCBNP CBG:  Recent Labs Lab 12/13/16 0801 12/13/16 1151 12/13/16 1718 12/13/16 2010 12/14/16 0721  GLUCAP 125* 164* 111* 151* 134*   D-Dimer No results for input(s): DDIMER in the last 72 hours. Hgb A1c  Recent Labs  12/12/16 0441  HGBA1C 5.8*   Lipid Profile  Recent Labs  12/12/16 0441  CHOL 195  HDL 32*  LDLCALC 139*  TRIG 119  CHOLHDL 6.1   Thyroid function studies  Recent Labs  12/12/16 0441  TSH 0.826   Anemia work up No results for input(s): VITAMINB12, FOLATE, FERRITIN, TIBC, IRON, RETICCTPCT in the last 72 hours. Urinalysis    Component Value Date/Time   COLORURINE STRAW (A) 12/11/2016 1901   APPEARANCEUR CLEAR 12/11/2016 1901   LABSPEC 1.008 12/11/2016 1901   PHURINE 6.0 12/11/2016 1901   GLUCOSEU NEGATIVE 12/11/2016 1901   HGBUR MODERATE (A) 12/11/2016 1901   BILIRUBINUR NEGATIVE 12/11/2016 1901   KETONESUR NEGATIVE 12/11/2016 1901   PROTEINUR >300 (A) 12/11/2016 1901   NITRITE NEGATIVE 12/11/2016 1901   LEUKOCYTESUR NEGATIVE 12/11/2016 1901   Sepsis Labs Invalid input(s): PROCALCITONIN,  WBC,  LACTICIDVEN Microbiology No results found for this or any previous visit (from the past 240 hour(s)).  Time coordinating discharge: 35 minutes  SIGNED:  Kerney Elbe, DO [facility] 12/14/2016, 10:59 AM Pager  [phone]  If 7PM-7AM, please contact night-coverage [email or web address] Password [email or web address]

## 2016-12-19 NOTE — Progress Notes (Signed)
Pt called to ask about his appointment and medications. Instruction given to [facility].

## 2016-12-25 MED FILL — ?AMLODIPINE BESYLATE 10 MG: 10 | 30 days supply | Qty: 30 | Fill #0

## 2016-12-25 MED FILL — TRUE METRIX TEST STRIP: 25 days supply | Qty: 100 | Fill #0

## 2016-12-25 MED FILL — ?ATORVASTATIN 20 MG TABLET: 20 | 30 days supply | Qty: 30 | Fill #0

## 2016-12-25 MED FILL — TRUEplus LANCETS 28G MISC: 25 days supply | Qty: 100 | Fill #0

## 2016-12-25 MED FILL — TRUE METRIX BLOOD GLUCOSE M: W/DEVICE | 1 days supply | Qty: 1 | Fill #0

## 2016-12-25 MED FILL — ?CARVEDILOL 25 MG TABLET: 25 | 30 days supply | Qty: 60 | Fill #0

## 2016-12-26 ENCOUNTER — Ambulatory Visit: Payer: 59 | Admitting: Family Medicine

## 2017-01-05 ENCOUNTER — Ambulatory Visit: Payer: 59 | Admitting: Family Medicine

## 2018-01-17 ENCOUNTER — Emergency Department (HOSPITAL_COMMUNITY)
Admission: EM | Admit: 2018-01-17 | Discharge: 2018-01-17 | Disposition: A | Discharge status: Home / Self Care | Payer: 59 | Attending: Emergency Medicine | Admitting: Emergency Medicine

## 2018-01-17 ENCOUNTER — Encounter (HOSPITAL_COMMUNITY): Payer: Self-pay | Admitting: Emergency Medicine

## 2018-01-17 DIAGNOSIS — Z760 Encounter for issue of repeat prescription: Secondary | ICD-10-CM | POA: Insufficient documentation

## 2018-01-17 DIAGNOSIS — E119 Type 2 diabetes mellitus without complications: Secondary | ICD-10-CM | POA: Insufficient documentation

## 2018-01-17 DIAGNOSIS — I10 Essential (primary) hypertension: Secondary | ICD-10-CM | POA: Insufficient documentation

## 2018-01-17 MED ORDER — AMLODIPINE BESYLATE 10 MG PO TABS: 10.0000 mg | ORAL_TABLET | Freq: Every day | ORAL | 1 refills | Status: DC

## 2018-01-17 MED ORDER — ATORVASTATIN CALCIUM 20 MG PO TABS: 20.0000 mg | ORAL_TABLET | Freq: Every day | ORAL | 1 refills | Status: DC

## 2018-01-17 MED ORDER — CARVEDILOL 25 MG PO TABS: 25.0000 mg | ORAL_TABLET | Freq: Two times a day (BID) | ORAL | 1 refills | Status: DC

## 2018-01-17 MED ORDER — CHLORTHALIDONE 25 MG PO TABS: 25.0000 mg | ORAL_TABLET | Freq: Every day | ORAL | 1 refills | Status: DC

## 2018-01-17 NOTE — ED Provider Notes (Signed)
[facility] DEPT Provider Note   CSN: [id number] Arrival date & time: 01/17/18  1802     History   Chief Complaint Chief Complaint  Patient presents with  . Medication Refill    HPI Stanton Kissoon is a 47 y.o. male.  The history is provided by the patient. A language interpreter was used.  Medication Refill  Medications/supplies requested:  Blood pressure medications Reason for request:  Medications ran out and clinic/provider not available Medications taken before: yes - see home medications   Patient has complete original prescription information: yes    Pt does not currently have a provider. Pt denies any current complaints.  Past Medical History:  Diagnosis Date  . Diabetes mellitus without complication ([place])   . Hypertension     Patient Active Problem List   Diagnosis Date Noted  . Hypomagnesemia 12/13/2016  . Hypertensive urgency 12/11/2016  . AKI (acute kidney injury) ([place]) 12/11/2016  . Fluid overload 12/11/2016  . HLD (hyperlipidemia) 06/14/2007  . DM (diabetes mellitus), type 2 ([place]) 06/11/2007  . HYPERTENSION 06/11/2007    History reviewed. No pertinent surgical history.      Home Medications    Prior to Admission medications   Medication Sig Start Date End Date Taking? Authorizing Provider  albuterol (PROVENTIL HFA;VENTOLIN HFA) 108 (90 Base) MCG/ACT inhaler Inhale 2 puffs into the lungs every 4 (four) hours as needed for wheezing or shortness of breath. 01/04/16  Yes Mabe, Shanon Brow, NP  amLODipine (NORVASC) 10 MG tablet Take 1 tablet (10 mg total) by mouth daily. 01/17/18   Fransico Meadow, PA-C  atorvastatin (LIPITOR) 20 MG tablet Take 1 tablet (20 mg total) by mouth daily at 6 PM. 01/17/18   Fransico Meadow, PA-C  blood glucose meter kit and supplies KIT Dispense based on patient and insurance preference. Use up to four times daily as directed. (FOR ICD-9 250.00, 250.01). 12/14/16   Raiford Noble Latif, DO  carvedilol  (COREG) 25 MG tablet Take 1 tablet (25 mg total) by mouth 2 (two) times daily with a meal. 01/17/18   Fransico Meadow, PA-C  chlorthalidone (HYGROTON) 25 MG tablet Take 1 tablet (25 mg total) by mouth daily. 01/17/18   Fransico Meadow, PA-C  metFORMIN (GLUCOPHAGE) 500 MG tablet Take 1 tablet (500 mg total) by mouth 2 (two) times daily with a meal. 09/20/14 01/04/16  Hyman Bible, PA-C    Family History Family History  Problem Relation Age of Onset  . CAD Mother   . Hypertension Mother   . Diabetes Neg Hx   . Stroke Neg Hx   . Cancer Neg Hx   . Kidney failure Neg Hx     Social History Social History   Tobacco Use  . Smoking status: Never Smoker  . Smokeless tobacco: Never Used  Substance Use Topics  . Alcohol use: Yes    Comment: occasional  . Drug use: Yes    Types: Marijuana     Allergies   Patient has no known allergies.   Review of Systems Review of Systems  All other systems reviewed and are negative.    Physical Exam Updated Vital Signs BP (!) 133/95 (BP Location: Right Arm)   Pulse (!) 102   Temp 98.7 F (37.1 C) (Oral)   Resp 18   SpO2 100%   Physical Exam  Constitutional: He appears well-developed and well-nourished.  HENT:  Head: Normocephalic and atraumatic.  Eyes: Conjunctivae are normal.  Neck: Neck supple.  Cardiovascular:  Normal rate and regular rhythm.  No murmur heard. Pulmonary/Chest: Effort normal and breath sounds normal. No respiratory distress.  Abdominal: Soft. There is no tenderness.  Musculoskeletal: He exhibits no edema.  Neurological: He is alert.  Skin: Skin is warm and dry.  Psychiatric: He has a normal mood and affect.  Nursing note and vitals reviewed.    ED Treatments / Results  Labs (all labs ordered are listed, but only abnormal results are displayed) Labs Reviewed - No data to display  EKG None  Radiology No results found.  Procedures Procedures (including critical care time)  Medications Ordered in  ED Medications - No data to display   Initial Impression / Assessment and Plan / ED Course  I have reviewed the triage vital signs and the nursing notes.  Pertinent labs & imaging results that were available during my care of the patient were reviewed by me and considered in my medical decision making (see chart for details).     Pt given number for wellness center.  Hospital referral number.   Final Clinical Impressions(s) / ED Diagnoses   Final diagnoses:  Medication refill  Hypertension, unspecified type    ED Discharge Orders        Ordered    amLODipine (NORVASC) 10 MG tablet  Daily     01/17/18 1902    atorvastatin (LIPITOR) 20 MG tablet  Daily-1800     01/17/18 1902    carvedilol (COREG) 25 MG tablet  2 times daily with meals     01/17/18 1902    chlorthalidone (HYGROTON) 25 MG tablet  Daily     01/17/18 1902    An After Visit Summary was printed and given to the patient.    Sidney Ace 01/17/18 1906    Dorie Rank, MD 01/21/18 1029

## 2018-01-17 NOTE — ED Triage Notes (Signed)
Pt is stating that he just wants to be seen for a medication refill and follow up with PCP that he can see instead of coming to the ER. He stated that he has been without the medication for 5 days. He has bilateral leg swelling and at night if he lays on his side he states that it is hard to breathe. He is refusing care for these things and just wants a refill and referral.

## 2018-01-27 ENCOUNTER — Encounter (HOSPITAL_COMMUNITY): Payer: Self-pay | Admitting: Emergency Medicine

## 2018-01-27 ENCOUNTER — Other Ambulatory Visit: Payer: Self-pay

## 2018-01-27 ENCOUNTER — Emergency Department (HOSPITAL_COMMUNITY)
Admission: EM | Admit: 2018-01-27 | Discharge: 2018-01-27 | Disposition: A | Discharge status: Home / Self Care | Payer: Self-pay | Attending: Emergency Medicine | Admitting: Emergency Medicine

## 2018-01-27 DIAGNOSIS — Z79899 Other long term (current) drug therapy: Secondary | ICD-10-CM | POA: Insufficient documentation

## 2018-01-27 DIAGNOSIS — I10 Essential (primary) hypertension: Secondary | ICD-10-CM

## 2018-01-27 DIAGNOSIS — E1122 Type 2 diabetes mellitus with diabetic chronic kidney disease: Secondary | ICD-10-CM | POA: Insufficient documentation

## 2018-01-27 DIAGNOSIS — N186 End stage renal disease: Secondary | ICD-10-CM | POA: Insufficient documentation

## 2018-01-27 DIAGNOSIS — I120 Hypertensive chronic kidney disease with stage 5 chronic kidney disease or end stage renal disease: Secondary | ICD-10-CM | POA: Insufficient documentation

## 2018-01-27 DIAGNOSIS — R609 Edema, unspecified: Secondary | ICD-10-CM | POA: Insufficient documentation

## 2018-01-27 DIAGNOSIS — J45909 Unspecified asthma, uncomplicated: Secondary | ICD-10-CM | POA: Insufficient documentation

## 2018-01-27 HISTORY — DX: Unspecified asthma, uncomplicated: J45.909

## 2018-01-27 HISTORY — DX: Disorder of kidney and ureter, unspecified: N28.9

## 2018-01-27 LAB — I-STAT CHEM 8, ED
BUN: 44 mg/dL — ABNORMAL HIGH (ref 6–20)
CALCIUM ION: 1.19 mmol/L (ref 1.15–1.40)
CREATININE: 4.1 mg/dL — AB (ref 0.61–1.24)
Chloride: 105 mmol/L (ref 98–111)
GLUCOSE: 161 mg/dL — AB (ref 70–99)
HCT: 26 % — ABNORMAL LOW (ref 39.0–52.0)
HEMOGLOBIN: 8.8 g/dL — AB (ref 13.0–17.0)
POTASSIUM: 4.2 mmol/L (ref 3.5–5.1)
Sodium: 141 mmol/L (ref 135–145)
TCO2: 24 mmol/L (ref 22–32)

## 2018-01-27 NOTE — ED Notes (Signed)
ED Provider at bedside. 

## 2018-01-27 NOTE — ED Triage Notes (Signed)
Pt returned to ED with c/o swelling in both feet, left greater than right.Pt has not been able to locate a PCP to follow up with his diabetes and blood pressure concerns. Feet warm, pitting edema noted,

## 2018-01-27 NOTE — ED Provider Notes (Addendum)
[facility] DEPT Provider Note   CSN: [id number] Arrival date & time: 01/27/18  1202     History   Chief Complaint Chief Complaint  Patient presents with  . Leg Swelling    swelling in feet    HPI Corey Park is a 47 y.o. male.  HPI  47 year old male history of type 2 diabetes, hypertension, CKD presents today stating that he is having bilateral lower extremity swelling over the past 2 days.  He started new medications on Thursday.  He was last seen in the ED on July 25 at that time he was prescribed amlodipine, carvedilol, chlorthalidone, and Lipitor.  He is also taking metformin.  Swelling appears to started since the new medications.  He is still hypertensive here in the ED.  He denies any chest pain, history of DVT, PE, or dyspnea.  He does not have risk factors for DVT and he has no lateralized swelling or history of same.  Past Medical History:  Diagnosis Date  . Asthma   . Diabetes mellitus without complication ([place])   . Hypertension   . Renal disorder     Patient Active Problem List   Diagnosis Date Noted  . Hypomagnesemia 12/13/2016  . Hypertensive urgency 12/11/2016  . AKI (acute kidney injury) ([place]) 12/11/2016  . Fluid overload 12/11/2016  . HLD (hyperlipidemia) 06/14/2007  . DM (diabetes mellitus), type 2 ([place]) 06/11/2007  . HYPERTENSION 06/11/2007    History reviewed. No pertinent surgical history.      Home Medications    Prior to Admission medications   Medication Sig Start Date End Date Taking? Authorizing Provider  albuterol (PROVENTIL HFA;VENTOLIN HFA) 108 (90 Base) MCG/ACT inhaler Inhale 2 puffs into the lungs every 4 (four) hours as needed for wheezing or shortness of breath. 01/04/16   Janne Napoleon, NP  amLODipine (NORVASC) 10 MG tablet Take 1 tablet (10 mg total) by mouth daily. 01/17/18   Fransico Meadow, PA-C  atorvastatin (LIPITOR) 20 MG tablet Take 1 tablet (20 mg total) by mouth daily at 6 PM. 01/17/18    Fransico Meadow, PA-C  blood glucose meter kit and supplies KIT Dispense based on patient and insurance preference. Use up to four times daily as directed. (FOR ICD-9 250.00, 250.01). 12/14/16   Raiford Noble Latif, DO  carvedilol (COREG) 25 MG tablet Take 1 tablet (25 mg total) by mouth 2 (two) times daily with a meal. 01/17/18   Fransico Meadow, PA-C  chlorthalidone (HYGROTON) 25 MG tablet Take 1 tablet (25 mg total) by mouth daily. 01/17/18   Fransico Meadow, PA-C    Family History Family History  Problem Relation Age of Onset  . CAD Mother   . Hypertension Mother   . Diabetes Neg Hx   . Stroke Neg Hx   . Cancer Neg Hx   . Kidney failure Neg Hx     Social History Social History   Tobacco Use  . Smoking status: Never Smoker  . Smokeless tobacco: Never Used  Substance Use Topics  . Alcohol use: Never    Frequency: Never    Comment: denies  . Drug use: Never    Types: Marijuana    Comment: denies     Allergies   Patient has no known allergies.   Review of Systems Review of Systems  All other systems reviewed and are negative.    Physical Exam Updated Vital Signs BP (!) 165/95 (BP Location: Right Arm)   Pulse 80  Temp 98.4 F (36.9 C) (Oral)   Resp 18   Wt 102.1 kg (225 lb)   SpO2 100%   BMI 29.69 kg/m   Physical Exam  Constitutional: He is oriented to person, place, and time. He appears well-developed and well-nourished.  HENT:  Head: Normocephalic and atraumatic.  Right Ear: External ear normal.  Left Ear: External ear normal.  Nose: Nose normal.  Eyes: EOM are normal.  Neck: Normal range of motion. No tracheal deviation present.  Cardiovascular: Normal rate, regular rhythm, normal heart sounds and intact distal pulses.  Pulmonary/Chest: Effort normal.  Abdominal: Soft. Bowel sounds are normal.  Musculoskeletal: Normal range of motion. He exhibits edema.  2+ pedal edema bilateral feet and lower anterior tibia  Neurological: He is alert and oriented  to person, place, and time.  Skin: Skin is warm and dry.  Psychiatric: He has a normal mood and affect. His behavior is normal.  Nursing note and vitals reviewed.    ED Treatments / Results  Labs (all labs ordered are listed, but only abnormal results are displayed) Labs Reviewed  I-STAT CHEM 8, ED    EKG None  Radiology No results found.  Procedures Procedures (including critical care time)  Medications Ordered in ED Medications - No data to display   Initial Impression / Assessment and Plan / ED Course  I have reviewed the triage vital signs and the nursing notes.  Pertinent labs & imaging results that were available during my care of the patient were reviewed by me and considered in my medical decision making (see chart for details).    He appears hemodynamically stable here.  His last creatinine was 2.61.  This was obtained in June.  Today it has increased to 4.  I discussed patient's care with Dr. Vanetta Mulders, on-call for nephrology.  She agrees with continuing his current medications.  It is unclear exactly how long he has been taking these but certainly no more than 3 days.  He is advised to continue these medications and of the urgency for his follow-up with nephrology.  He is given referral for follow-up and advised to call the office tomorrow to arrange follow-up this week. Final Clinical Impressions(s) / ED Diagnoses   Final diagnoses:  Peripheral edema  Hypertension, unspecified type  ESRD (end stage renal disease) [facility])    ED Discharge Orders    None       Pattricia Boss, MD 01/27/18 1513    Pattricia Boss, MD 01/27/18 1513

## 2018-01-27 NOTE — Discharge Instructions (Addendum)
Please find primary care through hospital number, or from resource guide.  You will need to have recheck of your blood pressure this week.  Your kidneys are failing and need close follow up.  Please call [facility] tomorrow to arrange for follow-up as soon as possible.  I spoke with Dr. Rolan Lipa and they are aware that you need to follow-up and will facilitate getting an appointment.

## 2018-06-16 ENCOUNTER — Emergency Department (HOSPITAL_COMMUNITY): Payer: Medicaid Other

## 2018-06-16 ENCOUNTER — Encounter (HOSPITAL_COMMUNITY): Payer: Self-pay

## 2018-06-16 ENCOUNTER — Inpatient Hospital Stay (HOSPITAL_COMMUNITY)
Admission: EM | Admit: 2018-06-16 | Discharge: 2018-06-19 | DRG: 291 | Disposition: A | Discharge status: Home / Self Care | Payer: Medicaid Other | Attending: Internal Medicine | Admitting: Internal Medicine

## 2018-06-16 ENCOUNTER — Other Ambulatory Visit: Payer: Self-pay

## 2018-06-16 DIAGNOSIS — I5031 Acute diastolic (congestive) heart failure: Secondary | ICD-10-CM | POA: Diagnosis not present

## 2018-06-16 DIAGNOSIS — R7989 Other specified abnormal findings of blood chemistry: Secondary | ICD-10-CM | POA: Diagnosis present

## 2018-06-16 DIAGNOSIS — N179 Acute kidney failure, unspecified: Secondary | ICD-10-CM | POA: Diagnosis present

## 2018-06-16 DIAGNOSIS — J189 Pneumonia, unspecified organism: Secondary | ICD-10-CM | POA: Diagnosis present

## 2018-06-16 DIAGNOSIS — N2581 Secondary hyperparathyroidism of renal origin: Secondary | ICD-10-CM | POA: Diagnosis present

## 2018-06-16 DIAGNOSIS — D631 Anemia in chronic kidney disease: Secondary | ICD-10-CM | POA: Diagnosis present

## 2018-06-16 DIAGNOSIS — T461X6A Underdosing of calcium-channel blockers, initial encounter: Secondary | ICD-10-CM | POA: Diagnosis present

## 2018-06-16 DIAGNOSIS — I132 Hypertensive heart and chronic kidney disease with heart failure and with stage 5 chronic kidney disease, or end stage renal disease: Secondary | ICD-10-CM | POA: Diagnosis present

## 2018-06-16 DIAGNOSIS — Z9119 Patient's noncompliance with other medical treatment and regimen: Secondary | ICD-10-CM

## 2018-06-16 DIAGNOSIS — B029 Zoster without complications: Secondary | ICD-10-CM | POA: Diagnosis present

## 2018-06-16 DIAGNOSIS — Z841 Family history of disorders of kidney and ureter: Secondary | ICD-10-CM

## 2018-06-16 DIAGNOSIS — T447X6A Underdosing of beta-adrenoreceptor antagonists, initial encounter: Secondary | ICD-10-CM | POA: Diagnosis present

## 2018-06-16 DIAGNOSIS — E1122 Type 2 diabetes mellitus with diabetic chronic kidney disease: Secondary | ICD-10-CM | POA: Diagnosis present

## 2018-06-16 DIAGNOSIS — T502X6A Underdosing of carbonic-anhydrase inhibitors, benzothiadiazides and other diuretics, initial encounter: Secondary | ICD-10-CM | POA: Diagnosis present

## 2018-06-16 DIAGNOSIS — Z8249 Family history of ischemic heart disease and other diseases of the circulatory system: Secondary | ICD-10-CM

## 2018-06-16 DIAGNOSIS — D509 Iron deficiency anemia, unspecified: Secondary | ICD-10-CM | POA: Diagnosis present

## 2018-06-16 DIAGNOSIS — I110 Hypertensive heart disease with heart failure: Secondary | ICD-10-CM | POA: Diagnosis present

## 2018-06-16 DIAGNOSIS — N189 Chronic kidney disease, unspecified: Secondary | ICD-10-CM

## 2018-06-16 DIAGNOSIS — I5043 Acute on chronic combined systolic (congestive) and diastolic (congestive) heart failure: Secondary | ICD-10-CM | POA: Diagnosis present

## 2018-06-16 DIAGNOSIS — R778 Other specified abnormalities of plasma proteins: Secondary | ICD-10-CM

## 2018-06-16 DIAGNOSIS — Z91128 Patient's intentional underdosing of medication regimen for other reason: Secondary | ICD-10-CM

## 2018-06-16 DIAGNOSIS — E785 Hyperlipidemia, unspecified: Secondary | ICD-10-CM | POA: Diagnosis present

## 2018-06-16 DIAGNOSIS — R9431 Abnormal electrocardiogram [ECG] [EKG]: Secondary | ICD-10-CM | POA: Diagnosis present

## 2018-06-16 DIAGNOSIS — Z9111 Patient's noncompliance with dietary regimen: Secondary | ICD-10-CM

## 2018-06-16 DIAGNOSIS — N185 Chronic kidney disease, stage 5: Secondary | ICD-10-CM | POA: Diagnosis present

## 2018-06-16 DIAGNOSIS — E119 Type 2 diabetes mellitus without complications: Secondary | ICD-10-CM

## 2018-06-16 DIAGNOSIS — I509 Heart failure, unspecified: Secondary | ICD-10-CM

## 2018-06-16 LAB — BASIC METABOLIC PANEL
ANION GAP: 9 (ref 5–15)
BUN: 48 mg/dL — AB (ref 6–20)
CHLORIDE: 110 mmol/L (ref 98–111)
CO2: 20 mmol/L — ABNORMAL LOW (ref 22–32)
Calcium: 8.2 mg/dL — ABNORMAL LOW (ref 8.9–10.3)
Creatinine, Ser: 4.88 mg/dL — ABNORMAL HIGH (ref 0.61–1.24)
GFR calc Af Amer: 15 mL/min — ABNORMAL LOW (ref >60)
GFR calc non Af Amer: 13 mL/min — ABNORMAL LOW (ref >60)
GLUCOSE: 146 mg/dL — AB (ref 70–99)
POTASSIUM: 4.7 mmol/L (ref 3.5–5.1)
Sodium: 139 mmol/L (ref 135–145)

## 2018-06-16 LAB — CBC WITH DIFFERENTIAL/PLATELET
ABS IMMATURE GRANULOCYTES: 0.07 10*3/uL (ref 0.00–0.07)
BASOS PCT: 0 %
Basophils Absolute: 0 10*3/uL (ref 0.0–0.1)
Eosinophils Absolute: 0.1 10*3/uL (ref 0.0–0.5)
Eosinophils Relative: 2 %
HEMATOCRIT: 30.6 % — AB (ref 39.0–52.0)
HEMOGLOBIN: 9.5 g/dL — AB (ref 13.0–17.0)
IMMATURE GRANULOCYTES: 1 %
LYMPHS ABS: 0.8 10*3/uL (ref 0.7–4.0)
LYMPHS PCT: 8 %
MCH: 29.2 pg (ref 26.0–34.0)
MCHC: 31 g/dL (ref 30.0–36.0)
MCV: 94.2 fL (ref 80.0–100.0)
MONO ABS: 0.6 10*3/uL (ref 0.1–1.0)
MONOS PCT: 6 %
NEUTROS ABS: 7.8 10*3/uL — AB (ref 1.7–7.7)
NEUTROS PCT: 83 %
PLATELETS: 186 10*3/uL (ref 150–400)
RBC: 3.25 MIL/uL — ABNORMAL LOW (ref 4.22–5.81)
RDW: 14.1 % (ref 11.5–15.5)
WBC: 9.3 10*3/uL (ref 4.0–10.5)
nRBC: 0 % (ref 0.0–0.2)

## 2018-06-16 LAB — CBC
HCT: 28.1 % — ABNORMAL LOW (ref 39.0–52.0)
Hemoglobin: 8.8 g/dL — ABNORMAL LOW (ref 13.0–17.0)
MCH: 29.7 pg (ref 26.0–34.0)
MCHC: 31.3 g/dL (ref 30.0–36.0)
MCV: 94.9 fL (ref 80.0–100.0)
NRBC: 0 % (ref 0.0–0.2)
Platelets: 173 10*3/uL (ref 150–400)
RBC: 2.96 MIL/uL — AB (ref 4.22–5.81)
RDW: 14 % (ref 11.5–15.5)
WBC: 8.6 10*3/uL (ref 4.0–10.5)

## 2018-06-16 LAB — I-STAT TROPONIN, ED: Troponin i, poc: 0.2 ng/mL (ref 0.00–0.08)

## 2018-06-16 LAB — CREATININE, SERUM
Creatinine, Ser: 4.87 mg/dL — ABNORMAL HIGH (ref 0.61–1.24)
GFR calc Af Amer: 15 mL/min — ABNORMAL LOW (ref >60)
GFR calc non Af Amer: 13 mL/min — ABNORMAL LOW (ref >60)

## 2018-06-16 LAB — BRAIN NATRIURETIC PEPTIDE
B NATRIURETIC PEPTIDE 5: 2805.4 pg/mL — AB (ref 0.0–100.0)
B Natriuretic Peptide: 2722.4 pg/mL — ABNORMAL HIGH (ref 0.0–100.0)

## 2018-06-16 MED ORDER — SODIUM CHLORIDE 0.9 % IV SOLN
1.0000 g | Freq: Once | INTRAVENOUS | Status: AC
  Administered 2018-06-16: 1 g via INTRAVENOUS
  Filled 2018-06-16: qty 10

## 2018-06-16 MED ORDER — AMLODIPINE BESYLATE 10 MG PO TABS
10.0000 mg | ORAL_TABLET | Freq: Every day | ORAL | Status: DC
  Administered 2018-06-16 – 2018-06-17 (×2): 10 mg via ORAL
  Filled 2018-06-16: qty 2
  Filled 2018-06-16 (×2): qty 1

## 2018-06-16 MED ORDER — ACETAMINOPHEN 325 MG PO TABS
650.0000 mg | ORAL_TABLET | Freq: Four times a day (QID) | ORAL | Status: DC | PRN
  Administered 2018-06-18: 650 mg via ORAL
  Filled 2018-06-16: qty 2

## 2018-06-16 MED ORDER — CARVEDILOL 12.5 MG PO TABS
12.5000 mg | ORAL_TABLET | Freq: Two times a day (BID) | ORAL | Status: DC
  Administered 2018-06-16 – 2018-06-18 (×4): 12.5 mg via ORAL
  Filled 2018-06-16 (×6): qty 1

## 2018-06-16 MED ORDER — ACETAMINOPHEN 650 MG RE SUPP: 650.0000 mg | Freq: Four times a day (QID) | RECTAL | Status: DC | PRN

## 2018-06-16 MED ORDER — ZOLPIDEM TARTRATE 5 MG PO TABS: 5.0000 mg | ORAL_TABLET | Freq: Every evening | ORAL | Status: DC | PRN

## 2018-06-16 MED ORDER — FUROSEMIDE 10 MG/ML IJ SOLN
80.0000 mg | Freq: Once | INTRAMUSCULAR | Status: AC
  Administered 2018-06-16: 80 mg via INTRAVENOUS
  Filled 2018-06-16: qty 8

## 2018-06-16 MED ORDER — GUAIFENESIN-DM 100-10 MG/5ML PO SYRP: 10.0000 mL | ORAL_SOLUTION | Freq: Four times a day (QID) | ORAL | Status: DC | PRN

## 2018-06-16 MED ORDER — HYDROCERIN EX CREA
TOPICAL_CREAM | Freq: Two times a day (BID) | CUTANEOUS | Status: DC
  Administered 2018-06-17 – 2018-06-18 (×4): via TOPICAL
  Filled 2018-06-16: qty 113

## 2018-06-16 MED ORDER — BENZONATATE 100 MG PO CAPS: 100.0000 mg | ORAL_CAPSULE | Freq: Three times a day (TID) | ORAL | Status: DC | PRN

## 2018-06-16 MED ORDER — ATORVASTATIN CALCIUM 20 MG PO TABS
20.0000 mg | ORAL_TABLET | Freq: Every day | ORAL | Status: DC
  Administered 2018-06-17 – 2018-06-18 (×2): 20 mg via ORAL
  Filled 2018-06-16 (×2): qty 1

## 2018-06-16 MED ORDER — CEFDINIR 300 MG PO CAPS
300.0000 mg | ORAL_CAPSULE | Freq: Every day | ORAL | Status: DC
  Administered 2018-06-17 – 2018-06-18 (×2): 300 mg via ORAL
  Filled 2018-06-16 (×2): qty 1

## 2018-06-16 MED ORDER — AZITHROMYCIN 250 MG PO TABS
250.0000 mg | ORAL_TABLET | Freq: Every day | ORAL | Status: DC
  Administered 2018-06-17 – 2018-06-18 (×2): 250 mg via ORAL
  Filled 2018-06-16 (×2): qty 1

## 2018-06-16 MED ORDER — FOLIC ACID 1 MG PO TABS
1.0000 mg | ORAL_TABLET | Freq: Every day | ORAL | Status: DC
  Administered 2018-06-17 – 2018-06-19 (×3): 1 mg via ORAL
  Filled 2018-06-16 (×3): qty 1

## 2018-06-16 MED ORDER — ENOXAPARIN SODIUM 30 MG/0.3ML ~~LOC~~ SOLN
30.0000 mg | SUBCUTANEOUS | Status: DC
  Administered 2018-06-17 – 2018-06-18 (×2): 30 mg via SUBCUTANEOUS
  Filled 2018-06-16 (×2): qty 0.3

## 2018-06-16 MED ORDER — CEFDINIR 300 MG PO CAPS: 300.0000 mg | ORAL_CAPSULE | Freq: Two times a day (BID) | ORAL | Status: DC

## 2018-06-16 MED ORDER — IPRATROPIUM-ALBUTEROL 0.5-2.5 (3) MG/3ML IN SOLN: 3.0000 mL | Freq: Three times a day (TID) | RESPIRATORY_TRACT | Status: DC | PRN

## 2018-06-16 MED ORDER — SODIUM CHLORIDE 0.9 % IV SOLN
500.0000 mg | Freq: Once | INTRAVENOUS | Status: AC
  Administered 2018-06-16: 500 mg via INTRAVENOUS
  Filled 2018-06-16: qty 500

## 2018-06-16 MED ORDER — FUROSEMIDE 10 MG/ML IJ SOLN
40.0000 mg | Freq: Once | INTRAMUSCULAR | Status: AC
  Administered 2018-06-17: 40 mg via INTRAVENOUS
  Filled 2018-06-16: qty 4

## 2018-06-16 NOTE — ED Notes (Signed)
ED TO INPATIENT HANDOFF REPORT  Name/Age/Gender Corey Park 47 y.o. male  Code Status    Code Status Orders  (From admission, onward)         Start     Ordered   06/16/18 1858  Full code  Continuous     06/16/18 1904        Code Status History    Date Active Date Inactive Code Status Order ID Comments User Context   12/12/2016 0210 12/14/2016 1616 Full Code [id number]  Toy Baker, MD Inpatient      Home/SNF/Other Home  Chief Complaint Shortness of Breath  Level of Care/Admitting Diagnosis ED Disposition    ED Disposition Condition [facility] Area: [facility] [[id number]]  Level of Care: Telemetry [5]  Admit to tele based on following criteria: Acute CHF  Diagnosis: Acute diastolic (congestive) heart failure [facility]) [[id number]]  Admitting Physician: Colbert Ewing [[id number]]  Attending Physician: Colbert Ewing [[id number]]  Estimated length of stay: past midnight tomorrow  Certification:: I certify this patient will need inpatient services for at least 2 midnights  PT Class (Do Not Modify): Inpatient [101]  PT Acc Code (Do Not Modify): Private [1]       Medical History Past Medical History:  Diagnosis Date  . Asthma   . Diabetes mellitus without complication ([place])   . Hypertension   . Renal disorder     Allergies No Known Allergies  IV Location/Drains/Wounds Patient Lines/Drains/Airways Status   Active Line/Drains/Airways    Name:   Placement date:   Placement time:   Site:   Days:   Peripheral IV 06/16/18 Left Hand   06/16/18    1608    Hand   less than 1          Labs/Imaging Results for orders placed or performed during the hospital encounter of 06/16/18 (from the past 48 hour(s))  Basic metabolic panel     Status: Abnormal   Collection Time: 06/16/18  3:41 PM  Result Value Ref Range   Sodium 139 135 - 145 mmol/L   Potassium 4.7 3.5 - 5.1 mmol/L   Chloride 110 98 - 111 mmol/L   CO2 20 (L) 22 - 32  mmol/L   Glucose, Bld 146 (H) 70 - 99 mg/dL   BUN 48 (H) 6 - 20 mg/dL   Creatinine, Ser 4.88 (H) 0.61 - 1.24 mg/dL   Calcium 8.2 (L) 8.9 - 10.3 mg/dL   GFR calc non Af Amer 13 (L) >60 mL/min   GFR calc Af Amer 15 (L) >60 mL/min   Anion gap 9 5 - 15    Comment: Performed at [facility], [place] [address] [place], [place] [address]  CBC with Differential     Status: Abnormal   Collection Time: 06/16/18  3:41 PM  Result Value Ref Range   WBC 9.3 4.0 - 10.5 K/uL   RBC 3.25 (L) 4.22 - 5.81 MIL/uL   Hemoglobin 9.5 (L) 13.0 - 17.0 g/dL   HCT 30.6 (L) 39.0 - 52.0 %   MCV 94.2 80.0 - 100.0 fL   MCH 29.2 26.0 - 34.0 pg   MCHC 31.0 30.0 - 36.0 g/dL   RDW 14.1 11.5 - 15.5 %   Platelets 186 150 - 400 K/uL   nRBC 0.0 0.0 - 0.2 %   Neutrophils Relative % 83 %   Neutro Abs 7.8 (H) 1.7 - 7.7 K/uL   Lymphocytes Relative 8 %  Lymphs Abs 0.8 0.7 - 4.0 K/uL   Monocytes Relative 6 %   Monocytes Absolute 0.6 0.1 - 1.0 K/uL   Eosinophils Relative 2 %   Eosinophils Absolute 0.1 0.0 - 0.5 K/uL   Basophils Relative 0 %   Basophils Absolute 0.0 0.0 - 0.1 K/uL   Immature Granulocytes 1 %   Abs Immature Granulocytes 0.07 0.00 - 0.07 K/uL    Comment: Performed at [facility], [place] [address], [place], [place] [address]  I-stat troponin, ED     Status: Abnormal   Collection Time: 06/16/18  3:48 PM  Result Value Ref Range   Troponin i, poc 0.20 (HH) 0.00 - 0.08 ng/mL   Comment NOTIFIED PHYSICIAN    Comment 3            Comment: Due to the release kinetics of cTnI, a negative result within the first hours of the onset of symptoms does not rule out myocardial infarction with certainty. If myocardial infarction is still suspected, repeat the test at appropriate intervals.   Brain natriuretic peptide     Status: Abnormal   Collection Time: 06/16/18  3:59 PM  Result Value Ref Range   B Natriuretic Peptide 2,722.4 (H) 0.0 - 100.0 pg/mL    Comment: Performed at  [facility], [place] [address] [place], [place] [address]  Creatinine, serum     Status: Abnormal   Collection Time: 06/16/18  7:04 PM  Result Value Ref Range   Creatinine, Ser 4.87 (H) 0.61 - 1.24 mg/dL   GFR calc non Af Amer 13 (L) >60 mL/min   GFR calc Af Amer 15 (L) >60 mL/min    Comment: Performed at [facility], [place] [address] [place], [place] [address]   Dg Chest 2 View  Result Date: 06/16/2018 CLINICAL DATA:  Lower extremity edema, cough, wheezing, symptoms for 1 day, history CHF, hypertension, asthma, diabetes mellitus, renal disorder EXAM: CHEST - 2 VIEW COMPARISON:  12/11/2016 FINDINGS: Enlargement of cardiac silhouette with pulmonary vascular congestion. Mediastinal contours normal. Slight accentuation of perihilar markings with consolidation in RIGHT lower lobe most consistent with pneumonia. Remaining lungs clear. No pleural effusion or pneumothorax. Bones unremarkable. IMPRESSION: Enlargement of cardiac silhouette with pulmonary vascular congestion. Slight chronic accentuation of pulmonary markings with new RIGHT lower lobe infiltrate consistent with pneumonia. Electronically Signed   By: Lavonia [name] M.D.   On: 06/16/2018 16:30   EKG Interpretation  Date/Time:  Sunday June 16 2018 15:37:42 EST Ventricular Rate:  86 PR Interval:    QRS Duration: 95 QT Interval:  406 QTC Calculation: 486 R Axis:   141 Text Interpretation:  Sinus rhythm Right axis deviation Abnormal R-wave progression, late transition Abnormal T, consider ischemia, diffuse leads Baseline wander in lead(s) V1 since last tracing no significant change Confirmed by Belfi, Melanie ([id number]) on 06/16/2018 4:05:30 PM   Pending Labs Unresulted Labs (From admission, onward)    Start     Ordered   06/23/18 0500  Creatinine, serum  (enoxaparin (LOVENOX)    CrCl < 30 ml/min)  Weekly,   R    Comments:  while on enoxaparin therapy.    06/16/18 1904   06/17/18 1300  Basic  metabolic panel  Now then every 24 hours,   R     06/16/18 1904   06/17/18 0500  Basic metabolic panel  Daily,   R     12 /22/19 1904   06/17/18 0500  CBC  Daily,   R  06/16/18 1904   06/17/18 0500  Magnesium  Daily,   R     06/16/18 1904   06/17/18 0500  Troponin I - Tomorrow AM 0500  Tomorrow morning,   R     06/16/18 1904   06/16/18 1904  Sodium, urine, random  Add-on,   R     06/16/18 1904   06/16/18 1904  Creatinine, urine, random  Add-on,   R     06/16/18 1904   06/16/18 1857  CBC  (enoxaparin (LOVENOX)    CrCl < 30 ml/min)  Once,   R    Comments:  Baseline for enoxaparin therapy IF NOT ALREADY DRAWN.  Notify MD if PLT < 100 K.    06/16/18 1904   06/16/18 1606  Brain natriuretic peptide  Add-on,   STAT     06/16/18 1606          Vitals/Pain Today's Vitals   06/16/18 1500 06/16/18 1830 06/16/18 1900 06/16/18 1930  BP: (!) 145/97 (!) 158/107 (!) 164/111 (!) 158/110  Pulse: 85 88 88 87  Resp:   (!) 22 20  Temp:      TempSrc:      SpO2: 99% 100% 100% 95%  Weight:      Height:      PainSc:    0-No pain    Isolation Precautions No active isolations  Medications Medications  amLODipine (NORVASC) tablet 10 mg (has no administration in time range)  atorvastatin (LIPITOR) tablet 20 mg (has no administration in time range)  carvedilol (COREG) tablet 12.5 mg (has no administration in time range)  enoxaparin (LOVENOX) injection 30 mg (has no administration in time range)  folic acid (FOLVITE) tablet 1 mg (has no administration in time range)  zolpidem (AMBIEN) tablet 5 mg (has no administration in time range)  acetaminophen (TYLENOL) tablet 650 mg (has no administration in time range)    Or  acetaminophen (TYLENOL) suppository 650 mg (has no administration in time range)  furosemide (LASIX) injection 40 mg (has no administration in time range)  azithromycin (ZITHROMAX) tablet 250 mg (has no administration in time range)  cefdinir (OMNICEF) capsule 300 mg (has no  administration in time range)  ipratropium-albuterol (DUONEB) 0.5-2.5 (3) MG/3ML nebulizer solution 3 mL (has no administration in time range)  guaiFENesin-dextromethorphan (ROBITUSSIN DM) 100-10 MG/5ML syrup 10 mL (has no administration in time range)  benzonatate (TESSALON) capsule 100 mg (has no administration in time range)  hydrocerin (EUCERIN) cream (has no administration in time range)  furosemide (LASIX) injection 80 mg (80 mg Intravenous Given 06/16/18 1744)  cefTRIAXone (ROCEPHIN) 1 g in sodium chloride 0.9 % 100 mL IVPB (0 g Intravenous Stopped 06/16/18 1900)  azithromycin (ZITHROMAX) 500 mg in sodium chloride 0.9 % 250 mL IVPB (0 mg Intravenous Stopped 06/16/18 2016)    Mobility walks with person assist

## 2018-06-16 NOTE — Progress Notes (Signed)
PHARMACY NOTE:  ANTIMICROBIAL RENAL DOSAGE ADJUSTMENT  Current antimicrobial regimen includes a mismatch between antimicrobial dosage and estimated renal function.  As per policy approved by the Pharmacy & Therapeutics and Medical Executive Committees, the antimicrobial dosage will be adjusted accordingly.  Current antimicrobial dosage:  Cefdinir 300 mg PO BID  Indication: PNA  Renal Function:  Estimated Creatinine Clearance: 24 mL/min (A) (by C-G formula based on SCr of 4.87 mg/dL (H)). []      On intermittent HD, scheduled: []      On CRRT    Antimicrobial dosage has been changed to:  Cefdinir 300 mg PO daily   Additional comments:   Thank you for allowing pharmacy to be a part of this patient's care.  Royetta Asal, PharmD, BCPS Pager [phone] 06/16/2018 8:36 PM

## 2018-06-16 NOTE — H&P (Signed)
History and Physical  Corey Park CBS:[id number] DOB: Aug 05, 1970 DOA: 06/16/2018 1354  Referring physician: Roxine Caddy [facility] ED) PCP: Patient, No Pcp Per  Outpatient Specialists: none  HISTORY   Chief Complaint: increased dyspnea on exertion, leg swelling, and cough  HPI: Corey Park is a 47 y.o. male with diastolic CHF, hx of CKD stage 3-4, DMT2, HTN, and hx of medication non compliance presented to [facility] ED with progressive dyspnea on exertion, peripheral edema, and cough x 4-5 days. Patient reports he has not been taking his prescribed medications including diuretics and has not yet established outpatient care with cardiology or nephrology since his last hospitalization for ADHF this past August. Worsening productive whitish-yellowish sputum cough for past 3 days. Increasing bilateral lower extremity edema and +new PND with difficulty sleeping due to dyspnea and his cough. Denies chest pain. He reports coworkers have had URI symptoms in the past few weeks, but denies personal hx of sore throat, rhinorrhea, or fever. Of note, his prior admission for ADHF was complicated by AKI on CKD and hypertensive crisis.   Review of Systems: + dyspnea, LE edema, productive cough, +PND - no fevers/chills - no chest pain - no nausea/vomiting; no tarry, melanotic or bloody stools - no dysuria, increased urinary frequency - has not monitored weights at home, but suspect weight increase over the past month Rest of systems reviewed are negative, except as per above history.   ED course:  Vitals Blood pressure (!) 158/107, pulse 88, temperature 99.2 F (37.3 C), temperature source Oral, resp. rate 18, height '6\' 1"'$  (1.854 m), weight 106.6 kg, SpO2 100 %. Received IV lasix '80mg'$  x 1; ceftriaxone IV 1gm x 1.   Past Medical History:  Diagnosis Date  . Asthma   . Diabetes mellitus without complication ([place])   . Hypertension   . Renal disorder    History reviewed. No pertinent surgical  history.  Social History:  reports that he has never smoked. He has never used smokeless tobacco. He reports that he does not drink alcohol or use drugs.  No Known Allergies  Family History  Problem Relation Age of Onset  . CAD Mother   . Hypertension Mother   . Diabetes Neg Hx   . Stroke Neg Hx   . Cancer Neg Hx   . Kidney failure Neg Hx       Prior to Admission medications   Medication Sig Start Date End Date Taking? Authorizing Provider  albuterol (PROVENTIL HFA;VENTOLIN HFA) 108 (90 Base) MCG/ACT inhaler Inhale 2 puffs into the lungs every 4 (four) hours as needed for wheezing or shortness of breath. 01/04/16   Janne Napoleon, NP  amLODipine (NORVASC) 10 MG tablet Take 1 tablet (10 mg total) by mouth daily. 01/17/18   Fransico Meadow, PA-C  atorvastatin (LIPITOR) 20 MG tablet Take 1 tablet (20 mg total) by mouth daily at 6 PM. 01/17/18   Fransico Meadow, PA-C  blood glucose meter kit and supplies KIT Dispense based on patient and insurance preference. Use up to four times daily as directed. (FOR ICD-9 250.00, 250.01). 12/14/16   Raiford Noble Latif, DO  carvedilol (COREG) 25 MG tablet Take 1 tablet (25 mg total) by mouth 2 (two) times daily with a meal. 01/17/18   Fransico Meadow, PA-C  chlorthalidone (HYGROTON) 25 MG tablet Take 1 tablet (25 mg total) by mouth daily. 01/17/18   Fransico Meadow, PA-C  metFORMIN (GLUCOPHAGE) 500 MG tablet Take 500 mg by mouth 2 (two)  times daily with a meal.    [provider]    PHYSICAL EXAM   Temp:  [99.2 F (37.3 C)] 99.2 F (37.3 C) (12/22 1236) Pulse Rate:  [84-102] 88 (12/22 1830) Resp:  [18-20] 18 (12/22 1453) BP: (137-179)/(97-118) 158/107 (12/22 1830) SpO2:  [99 %-100 %] 100 % (12/22 1830) Weight:  [106.6 kg] 106.6 kg (12/22 1236)  BP (!) 158/107   Pulse 88   Temp 99.2 F (37.3 C) (Oral)   Resp 18   Ht '6\' 1"'$  (1.854 m)   Wt 106.6 kg   SpO2 100%   BMI 31.00 kg/m    GEN obese middle-aged african-american male; resting  comfortably in bed, breathing unlabored  HEENT NCAT EOM intact PERRL; clear oropharynx, no cervical LAD; moist mucus membranes  JVP estimated 10 cm H2O above RA; + HJR ; no carotid bruits b/l ;  CV regular normal rate; normal S1 and S2; no m/r/g or S3/S4; PMI diffuse; no parasternal heave  RESP  Crackles over R lower lung; clear L lung; breathing unlabored and symmetric  ABD soft NT ND, obese +normoactive BS  EXT warm throughout b/l; 3+ pitting edema to upper shins PULSES  DP and radials 2+ b/l  SKIN/MSK dry skin over legs b/l  NEURO/PSYCH AAOx4; no focal deficits   DATA   LABS ON ADMISSION:  Basic Metabolic Panel: Recent Labs  Lab 06/16/18 1541  NA 139  K 4.7  CL 110  CO2 20*  GLUCOSE 146*  BUN 48*  CREATININE 4.88*  CALCIUM 8.2*   CBC: Recent Labs  Lab 06/16/18 1541  WBC 9.3  NEUTROABS 7.8*  HGB 9.5*  HCT 30.6*  MCV 94.2  PLT 186   Liver Function Tests: No results for input(s): AST, ALT, ALKPHOS, BILITOT, PROT, ALBUMIN in the last 168 hours. No results for input(s): LIPASE, AMYLASE in the last 168 hours. No results for input(s): AMMONIA in the last 168 hours. Coagulation:  No results found for: INR, PROTIME No results found for: PTT Lactic Acid, Venous:  No results found for: LATICACIDVEN Cardiac Enzymes: No results for input(s): CKTOTAL, CKMB, CKMBINDEX, TROPONINI in the last 168 hours. Urinalysis:    Component Value Date/Time   COLORURINE STRAW (A) 12/11/2016 1901   APPEARANCEUR CLEAR 12/11/2016 1901   LABSPEC 1.008 12/11/2016 1901   PHURINE 6.0 12/11/2016 1901   GLUCOSEU NEGATIVE 12/11/2016 1901   HGBUR MODERATE (A) 12/11/2016 1901   BILIRUBINUR NEGATIVE 12/11/2016 1901   KETONESUR NEGATIVE 12/11/2016 1901   PROTEINUR >300 (A) 12/11/2016 1901   NITRITE NEGATIVE 12/11/2016 1901   LEUKOCYTESUR NEGATIVE 12/11/2016 1901    BNP (last 3 results) No results for input(s): PROBNP in the last 8760 hours. CBG: No results for input(s): GLUCAP in the last  168 hours.  Radiological Exams on Admission: Dg Chest 2 View  Result Date: 06/16/2018 CLINICAL DATA:  Lower extremity edema, cough, wheezing, symptoms for 1 day, history CHF, hypertension, asthma, diabetes mellitus, renal disorder EXAM: CHEST - 2 VIEW COMPARISON:  12/11/2016 FINDINGS: Enlargement of cardiac silhouette with pulmonary vascular congestion. Mediastinal contours normal. Slight accentuation of perihilar markings with consolidation in RIGHT lower lobe most consistent with pneumonia. Remaining lungs clear. No pleural effusion or pneumothorax. Bones unremarkable. IMPRESSION: Enlargement of cardiac silhouette with pulmonary vascular congestion. Slight chronic accentuation of pulmonary markings with new RIGHT lower lobe infiltrate consistent with pneumonia. Electronically Signed   By: Lavonia [name] M.D.   On: 06/16/2018 16:30    EKG: Independently reviewed. NSR and LVH  with repolarization abnormalities, TWI in lateral leads (unchanged from baseline)   ASSESSMENT AND PLAN   Assessment: Henson Fraticelli is a 47 y.o. male with diastolic CHF, hx of CKD stage 3-4, DMT2, HTN, and hx of medication non compliance who presents with signs and symptoms consistent with fluid overload (weight up ~ 12 lbs), as well as increased productive cough. Afebrile and no rhinorrhea, sore throat or typical URI symptoms. Consolidation over R lower lobe on CXR. Although suspect that volume overload in setting of medication and dietary non compliance is main driver of presentation, will continue treating empirically for CAP. Mild troponin elevation without angina, likely demand in setting of volume overload and significant renal dysfunction. Course will be dictated by volume status and renal function -- note that prior hospitalizations for ADHF were complicated by fluctuating renal function. It is critical for this patient to establish care with outpatient nephrology and cardiology upon discharge, a point which I discussed  with the patient and his wife at bedside.   Active Problems:   Acute diastolic (congestive) heart failure (HCC)  Plan:   # Acute diastolic CHF due to med and dietary non compliance. Complicated by CKD stage 3-4 > admission weight 235 lbs; dry weight likely around 222 lbs. Last TTE 11/2016 EF 55-60% > BNP 2700s (baseline in 200s?) - s/p IV diuresis with 80 mg lasix in ED - ordered '40mg'$  IV lasix overnight - goal net negative at least 2L per day - strict ins/outs; daily weights - fluid and dietary sodium restriction - holding ace-inhibitor in setting of active diuresis and CKD - b-blocker: resume coreg at lower dose 12.'5mg'$  BID and uptitrate - resume home amlodipine at '10mg'$  qd for afterload reduction - note: diuresis may be limited by renal dysfunction - telemetry - repeat troponin in AM - outpatient cardiology follow up   # Concern for RLL community acquired PNA > afebrile; no clear URI sx on admission - continue azithro and cephalosporin PO (D1 12/21) - prn nebs for dyspnea - guaifenesin and tessalon prn cough - incentive spirometer  # Chronic normocytic anemia; admission Hb 9.5. Likely multifactorial due to renal disease  > baseline Hb 11-12  - monitor daily CBC  # AKI on CKD stage 3, likely approaching stage 4. Likely cardiorenal and pre-renal > Cr previous baseline 2s but in 4s on this and prior admission - may see further bump in Cr due to evolving cardiorenal injury but would favor continuing with diuresis given frank volume overload - urine sodium and Cr pending - will contact inpatient nephrology if urine output does not respond to lasix or persistently worsening dysrunction - avoid nephrotoxic agents - needs outpatient renal follow up  # Chronic HTN - non compliant with meds - continue amlodipine and coreg as above - holding home chlorthalidone during active diuresis  # DMT2 - holding home metformin given AKI on CKD   DVT Prophylaxis: lovenox ppx   Code Status:   Full Code Family Communication: patient and wife at bedside  Disposition Plan: admit to telemetry for ADHF and AKI   Patient contact: Extended Emergency Contact Information Primary Emergency Contact: Matos,[name] Address: [place] [address] [place] of [place] Phone: [phone] Mobile Phone: [phone] Relation: Mother Secondary Emergency Contact: [place], [place] Phone: [phone] Relation: Spouse  Time spent: > 35 mins  Colbert Ewing, MD [facility] Pager [phone]  If 7PM-7AM, please contact night-coverage [email or web address] Password [email or web address] 06/16/2018,  7:06 PM

## 2018-06-16 NOTE — ED Provider Notes (Signed)
[facility] DEPT Provider Note   CSN: [id number] Arrival date & time: 06/16/18  1225     History   Chief Complaint Chief Complaint  Patient presents with  . Leg Swelling  . Wheezing    HPI Corey Park is a 47 y.o. male.  Patient is a 47 year old male with a history of hypertension and CHF who presents with shortness of breath and leg swelling.  He has been out of his home medications for the last several months.  He has had shortness of breath over the last 2 days.  He denies any chest pain.  He does have some leg swelling which is more than his baseline swelling.  No cough or chest congestion.  No known fevers.     Past Medical History:  Diagnosis Date  . Asthma   . Diabetes mellitus without complication ([place])   . Hypertension   . Renal disorder     Patient Active Problem List   Diagnosis Date Noted  . Acute diastolic (congestive) heart failure ([place]) 06/16/2018  . Hypomagnesemia 12/13/2016  . Hypertensive urgency 12/11/2016  . AKI (acute kidney injury) ([place]) 12/11/2016  . Fluid overload 12/11/2016  . HLD (hyperlipidemia) 06/14/2007  . DM (diabetes mellitus), type 2 ([place]) 06/11/2007  . HYPERTENSION 06/11/2007    History reviewed. No pertinent surgical history.      Home Medications    Prior to Admission medications   Medication Sig Start Date End Date Taking? Authorizing Provider  albuterol (PROVENTIL HFA;VENTOLIN HFA) 108 (90 Base) MCG/ACT inhaler Inhale 2 puffs into the lungs every 4 (four) hours as needed for wheezing or shortness of breath. 01/04/16   Janne Napoleon, NP  amLODipine (NORVASC) 10 MG tablet Take 1 tablet (10 mg total) by mouth daily. 01/17/18   Fransico Meadow, PA-C  atorvastatin (LIPITOR) 20 MG tablet Take 1 tablet (20 mg total) by mouth daily at 6 PM. 01/17/18   Fransico Meadow, PA-C  blood glucose meter kit and supplies KIT Dispense based on patient and insurance preference. Use up to four times daily as  directed. (FOR ICD-9 250.00, 250.01). 12/14/16   Raiford Noble Latif, DO  carvedilol (COREG) 25 MG tablet Take 1 tablet (25 mg total) by mouth 2 (two) times daily with a meal. 01/17/18   Fransico Meadow, PA-C  chlorthalidone (HYGROTON) 25 MG tablet Take 1 tablet (25 mg total) by mouth daily. 01/17/18   Fransico Meadow, PA-C  metFORMIN (GLUCOPHAGE) 500 MG tablet Take 500 mg by mouth 2 (two) times daily with a meal.    [provider]    Family History Family History  Problem Relation Age of Onset  . CAD Mother   . Hypertension Mother   . Diabetes Neg Hx   . Stroke Neg Hx   . Cancer Neg Hx   . Kidney failure Neg Hx     Social History Social History   Tobacco Use  . Smoking status: Never Smoker  . Smokeless tobacco: Never Used  Substance Use Topics  . Alcohol use: Never    Frequency: Never    Comment: denies  . Drug use: Never    Types: Marijuana    Comment: denies     Allergies   Patient has no known allergies.   Review of Systems Review of Systems  Constitutional: Negative for chills, diaphoresis, fatigue and fever.  HENT: Negative for congestion, rhinorrhea and sneezing.   Eyes: Negative.   Respiratory: Positive for shortness of breath and  wheezing. Negative for cough and chest tightness.   Cardiovascular: Positive for leg swelling. Negative for chest pain.  Gastrointestinal: Negative for abdominal pain, blood in stool, diarrhea, nausea and vomiting.  Genitourinary: Negative for difficulty urinating, flank pain, frequency and hematuria.  Musculoskeletal: Negative for arthralgias and back pain.  Skin: Negative for rash.  Neurological: Negative for dizziness, speech difficulty, weakness, numbness and headaches.     Physical Exam Updated Vital Signs BP (!) 128/94 (BP Location: Left Arm)   Pulse 87   Temp 99.8 F (37.7 C) (Oral)   Resp 18   Ht _0  (1.854 m)   Wt 106.6 kg   SpO2 100%   BMI 31.00 kg/m   Physical Exam Constitutional:       Appearance: He is well-developed.  HENT:     Head: Normocephalic and atraumatic.  Eyes:     Pupils: Pupils are equal, round, and reactive to light.  Neck:     Musculoskeletal: Normal range of motion and neck supple.  Cardiovascular:     Rate and Rhythm: Normal rate and regular rhythm.     Heart sounds: Normal heart sounds.  Pulmonary:     Effort: Pulmonary effort is normal. No respiratory distress.     Breath sounds: Normal breath sounds. No wheezing or rales.  Chest:     Chest wall: No tenderness.  Abdominal:     General: Bowel sounds are normal.     Palpations: Abdomen is soft.     Tenderness: There is no abdominal tenderness. There is no guarding or rebound.  Musculoskeletal: Normal range of motion.     Right lower leg: Edema present.     Left lower leg: Edema present.     Comments: 2+ pitting edema bilaterally  Lymphadenopathy:     Cervical: No cervical adenopathy.  Skin:    General: Skin is warm and dry.     Findings: No rash.  Neurological:     Mental Status: He is alert and oriented to person, place, and time.      ED Treatments / Results  Labs (all labs ordered are listed, but only abnormal results are displayed) Labs Reviewed  BASIC METABOLIC PANEL - Abnormal; Notable for the following components:      Result Value   CO2 20 (*)    Glucose, Bld 146 (*)    BUN 48 (*)    Creatinine, Ser 4.88 (*)    Calcium 8.2 (*)    GFR calc non Af Amer 13 (*)    GFR calc Af Amer 15 (*)    All other components within normal limits  CBC WITH DIFFERENTIAL/PLATELET - Abnormal; Notable for the following components:   RBC 3.25 (*)    Hemoglobin 9.5 (*)    HCT 30.6 (*)    Neutro Abs 7.8 (*)    All other components within normal limits  BRAIN NATRIURETIC PEPTIDE - Abnormal; Notable for the following components:   B Natriuretic Peptide 2,722.4 (*)    All other components within normal limits  BRAIN NATRIURETIC PEPTIDE - Abnormal; Notable for the following components:   B  Natriuretic Peptide 2,805.4 (*)    All other components within normal limits  CBC - Abnormal; Notable for the following components:   RBC 2.96 (*)    Hemoglobin 8.8 (*)    HCT 28.1 (*)    All other components within normal limits  CREATININE, SERUM - Abnormal; Notable for the following components:   Creatinine, Ser 4.87 (*)  GFR calc non Af Amer 13 (*)    GFR calc Af Amer 15 (*)    All other components within normal limits  I-STAT TROPONIN, ED - Abnormal; Notable for the following components:   Troponin i, poc 0.20 (*)    All other components within normal limits  BASIC METABOLIC PANEL  CBC  MAGNESIUM  SODIUM, URINE, RANDOM  CREATININE, URINE, RANDOM  TROPONIN I    EKG EKG Interpretation  Date/Time:  Sunday June 16 2018 15:37:42 EST Ventricular Rate:  86 PR Interval:    QRS Duration: 95 QT Interval:  406 QTC Calculation: 486 R Axis:   141 Text Interpretation:  Sinus rhythm Right axis deviation Abnormal R-wave progression, late transition Abnormal T, consider ischemia, diffuse leads Baseline wander in lead(s) V1 since last tracing no significant change Confirmed by Malvin Johns [phone]) on 06/16/2018 4:05:30 PM   Radiology Dg Chest 2 View  Result Date: 06/16/2018 CLINICAL DATA:  Lower extremity edema, cough, wheezing, symptoms for 1 day, history CHF, hypertension, asthma, diabetes mellitus, renal disorder EXAM: CHEST - 2 VIEW COMPARISON:  12/11/2016 FINDINGS: Enlargement of cardiac silhouette with pulmonary vascular congestion. Mediastinal contours normal. Slight accentuation of perihilar markings with consolidation in RIGHT lower lobe most consistent with pneumonia. Remaining lungs clear. No pleural effusion or pneumothorax. Bones unremarkable. IMPRESSION: Enlargement of cardiac silhouette with pulmonary vascular congestion. Slight chronic accentuation of pulmonary markings with new RIGHT lower lobe infiltrate consistent with pneumonia. Electronically Signed   By: Lavonia [name] M.D.   On: 06/16/2018 16:30    Procedures Procedures (including critical care time)  Medications Ordered in ED Medications  amLODipine (NORVASC) tablet 10 mg (10 mg Oral Given 06/16/18 2218)  atorvastatin (LIPITOR) tablet 20 mg (has no administration in time range)  carvedilol (COREG) tablet 12.5 mg (12.5 mg Oral Given 06/16/18 2218)  enoxaparin (LOVENOX) injection 30 mg (has no administration in time range)  folic acid (FOLVITE) tablet 1 mg (has no administration in time range)  zolpidem (AMBIEN) tablet 5 mg (has no administration in time range)  acetaminophen (TYLENOL) tablet 650 mg (has no administration in time range)    Or  acetaminophen (TYLENOL) suppository 650 mg (has no administration in time range)  furosemide (LASIX) injection 40 mg (has no administration in time range)  azithromycin (ZITHROMAX) tablet 250 mg (has no administration in time range)  ipratropium-albuterol (DUONEB) 0.5-2.5 (3) MG/3ML nebulizer solution 3 mL (has no administration in time range)  guaiFENesin-dextromethorphan (ROBITUSSIN DM) 100-10 MG/5ML syrup 10 mL (has no administration in time range)  benzonatate (TESSALON) capsule 100 mg (has no administration in time range)  hydrocerin (EUCERIN) cream (has no administration in time range)  cefdinir (OMNICEF) capsule 300 mg (has no administration in time range)  furosemide (LASIX) injection 80 mg (80 mg Intravenous Given 06/16/18 1744)  cefTRIAXone (ROCEPHIN) 1 g in sodium chloride 0.9 % 100 mL IVPB (0 g Intravenous Stopped 06/16/18 1900)  azithromycin (ZITHROMAX) 500 mg in sodium chloride 0.9 % 250 mL IVPB (0 mg Intravenous Stopped 06/16/18 2016)     Initial Impression / Assessment and Plan / ED Course  I have reviewed the triage vital signs and the nursing notes.  Pertinent labs & imaging results that were available during my care of the patient were reviewed by me and considered in my medical decision making (see chart for details).     Patient  is a 47 year old male who presents with shortness of breath and leg swelling.  He has evidence of congestive heart  failure.  He also has some evidence of pneumonia on x-ray and was given antibiotics for this.  He was given a dose of Lasix and his breathing seems to have improved.  He does have an elevated troponin but denies any chest pain.  I feel this is likely related to demand ischemia.  He has chronic kidney disease and his creatinine is over 4.  It was similar to this in August of this year and he was supposed to follow-up with nephrology but did not.  I spoke with Dr. Hampton Abbot with the hospitalist service to admit the patient for further treatment.  Final Clinical Impressions(s) / ED Diagnoses   Final diagnoses:  Acute on chronic congestive heart failure, unspecified heart failure type ([place])  Chronic kidney disease, unspecified CKD stage  Elevated troponin  Community acquired pneumonia, unspecified laterality    ED Discharge Orders    None       Malvin Johns, MD 06/16/18 2329

## 2018-06-16 NOTE — ED Triage Notes (Signed)
Pt is alert and oriented x 4 and is verbally responsive. Pt reports that he has LE edema, coughing and wheezing x 1 day. Pt states that he needs medication. Pt does report that he has a hx of CHF.  Pt states that he does not have a PCP at this time. Pt is breathing unlabored and is able to talk in full sentences. Pt denies any pain at this time.

## 2018-06-17 ENCOUNTER — Inpatient Hospital Stay (HOSPITAL_COMMUNITY): Payer: Medicaid Other

## 2018-06-17 DIAGNOSIS — E1129 Type 2 diabetes mellitus with other diabetic kidney complication: Secondary | ICD-10-CM

## 2018-06-17 DIAGNOSIS — I5031 Acute diastolic (congestive) heart failure: Secondary | ICD-10-CM

## 2018-06-17 DIAGNOSIS — N185 Chronic kidney disease, stage 5: Secondary | ICD-10-CM

## 2018-06-17 DIAGNOSIS — I10 Essential (primary) hypertension: Secondary | ICD-10-CM

## 2018-06-17 DIAGNOSIS — N189 Chronic kidney disease, unspecified: Secondary | ICD-10-CM

## 2018-06-17 DIAGNOSIS — I361 Nonrheumatic tricuspid (valve) insufficiency: Secondary | ICD-10-CM

## 2018-06-17 DIAGNOSIS — R809 Proteinuria, unspecified: Secondary | ICD-10-CM

## 2018-06-17 DIAGNOSIS — I132 Hypertensive heart and chronic kidney disease with heart failure and with stage 5 chronic kidney disease, or end stage renal disease: Principal | ICD-10-CM

## 2018-06-17 DIAGNOSIS — Z992 Dependence on renal dialysis: Secondary | ICD-10-CM

## 2018-06-17 DIAGNOSIS — I370 Nonrheumatic pulmonary valve stenosis: Secondary | ICD-10-CM

## 2018-06-17 HISTORY — DX: End stage renal disease: Z99.2

## 2018-06-17 HISTORY — DX: Chronic kidney disease, stage 5: N18.5

## 2018-06-17 LAB — CBC
HCT: 27.3 % — ABNORMAL LOW (ref 39.0–52.0)
Hemoglobin: 8.4 g/dL — ABNORMAL LOW (ref 13.0–17.0)
MCH: 29.2 pg (ref 26.0–34.0)
MCHC: 30.8 g/dL (ref 30.0–36.0)
MCV: 94.8 fL (ref 80.0–100.0)
Platelets: 161 10*3/uL (ref 150–400)
RBC: 2.88 MIL/uL — ABNORMAL LOW (ref 4.22–5.81)
RDW: 14 % (ref 11.5–15.5)
WBC: 6.8 10*3/uL (ref 4.0–10.5)
nRBC: 0 % (ref 0.0–0.2)

## 2018-06-17 LAB — LIPID PANEL
CHOLESTEROL: 127 mg/dL (ref 0–200)
HDL: 29 mg/dL — ABNORMAL LOW (ref >40)
LDL Cholesterol: 86 mg/dL (ref 0–99)
Total CHOL/HDL Ratio: 4.4 RATIO
Triglycerides: 59 mg/dL (ref <150)
VLDL: 12 mg/dL (ref 0–40)

## 2018-06-17 LAB — BASIC METABOLIC PANEL
Anion gap: 9 (ref 5–15)
Anion gap: 5 (ref 5–15)
BUN: 53 mg/dL — ABNORMAL HIGH (ref 6–20)
BUN: 53 mg/dL — AB (ref 6–20)
CHLORIDE: 107 mmol/L (ref 98–111)
CO2: 22 mmol/L (ref 22–32)
CO2: 22 mmol/L (ref 22–32)
Calcium: 8.2 mg/dL — ABNORMAL LOW (ref 8.9–10.3)
Calcium: 7.9 mg/dL — ABNORMAL LOW (ref 8.9–10.3)
Chloride: 110 mmol/L (ref 98–111)
Creatinine, Ser: 5.1 mg/dL — ABNORMAL HIGH (ref 0.61–1.24)
Creatinine, Ser: 5.31 mg/dL — ABNORMAL HIGH (ref 0.61–1.24)
GFR calc Af Amer: 14 mL/min — ABNORMAL LOW (ref >60)
GFR calc Af Amer: 14 mL/min — ABNORMAL LOW (ref >60)
GFR calc non Af Amer: 12 mL/min — ABNORMAL LOW (ref >60)
GFR calc non Af Amer: 12 mL/min — ABNORMAL LOW (ref >60)
Glucose, Bld: 109 mg/dL — ABNORMAL HIGH (ref 70–99)
Glucose, Bld: 159 mg/dL — ABNORMAL HIGH (ref 70–99)
POTASSIUM: 4.4 mmol/L (ref 3.5–5.1)
Potassium: 4.5 mmol/L (ref 3.5–5.1)
Sodium: 138 mmol/L (ref 135–145)
Sodium: 137 mmol/L (ref 135–145)

## 2018-06-17 LAB — ECHOCARDIOGRAM COMPLETE
Height: 73 in
Weight: 3961.6 oz

## 2018-06-17 LAB — MAGNESIUM: Magnesium: 1.8 mg/dL (ref 1.7–2.4)

## 2018-06-17 LAB — TROPONIN I
Troponin I: 0.19 ng/mL (ref <0.03)
Troponin I: 0.18 ng/mL (ref <0.03)

## 2018-06-17 LAB — HEMOGLOBIN A1C
Hgb A1c MFr Bld: 5.1 % (ref 4.8–5.6)
Mean Plasma Glucose: 99.67 mg/dL

## 2018-06-17 LAB — PROCALCITONIN: Procalcitonin: 0.19 ng/mL

## 2018-06-17 MED ORDER — FUROSEMIDE 10 MG/ML IJ SOLN
60.0000 mg | Freq: Four times a day (QID) | INTRAMUSCULAR | Status: DC
  Administered 2018-06-17 – 2018-06-18 (×3): 60 mg via INTRAVENOUS
  Filled 2018-06-17 (×3): qty 6

## 2018-06-17 MED ORDER — FUROSEMIDE 10 MG/ML IJ SOLN: 40.0000 mg | Freq: Two times a day (BID) | INTRAMUSCULAR | Status: DC

## 2018-06-17 MED ORDER — ISOSORBIDE MONONITRATE ER 30 MG PO TB24
15.0000 mg | ORAL_TABLET | Freq: Every day | ORAL | Status: DC
  Administered 2018-06-17 – 2018-06-18 (×2): 15 mg via ORAL
  Filled 2018-06-17 (×2): qty 1

## 2018-06-17 MED ORDER — AMLODIPINE BESYLATE 5 MG PO TABS
5.0000 mg | ORAL_TABLET | Freq: Every day | ORAL | Status: DC
  Administered 2018-06-18: 5 mg via ORAL
  Filled 2018-06-17: qty 1

## 2018-06-17 MED ORDER — HYDRALAZINE HCL 25 MG PO TABS
25.0000 mg | ORAL_TABLET | Freq: Three times a day (TID) | ORAL | Status: DC
  Administered 2018-06-17 – 2018-06-19 (×6): 25 mg via ORAL
  Filled 2018-06-17 (×7): qty 1

## 2018-06-17 NOTE — Consult Note (Addendum)
Cardiology Consultation:   Patient ID: Corey Park; [id number]; 08-Jun-1971   Admit date: 06/16/2018 Date of Consult: 06/17/2018  Primary Care Provider: Patient, No Pcp Per Primary Cardiologist: Dr. Candee Furbish, MD   Patient Profile:   Corey Park is a 47 y.o. male with a hx of diastolic CHF, history of CKD stage III, DM 2, HTN and history of medication noncompliance who is being seen today for the evaluation of shortness of breath and BLE at the request of Dr. Hampton Abbot.  History of Present Illness:   Corey Park is a 47 year old male with a history stated above who presented to [facility] on 06/16/2017 with acute onset dyspnea on exertion and lower extremity swelling which began Saturday evening. Prior to that, pt reports being in his normal state of health. He states that since July he has been noncompliant with his prescribed diuretics and antihypertensives and has not yet established patient care with cardiology or nephrology since last hospitalization for acute on chronic diastolic CHF exacerbation in 01/2018. He denies chest pain, diaphoresis, nausea, vomiting, fever, chills, palpitations or syncope.  In the ED, he was hypertensive at 158/107. EKG with NSR with new T wave inversions in leads I, II, III, aVF and V3-V6, change from prior tracing on 12/11/2016. CXR with enlarged cardiac silhouette with pulmonary vascular congestion, slight accentuation of perihilar markings with consolidation in right lower lobe consistent with PNA, and no pleural effusion or pneumothorax. BNP greater than 2800 on presentation. Creatinine 4.87 with subsequent creatinine at 5.10 today.  Troponin elevated at 0.19 likely in the setting of acute fluid volume overload.  Of note, patient was last seen by Dr. Marlou Porch in hospital consultation 12/12/2016 for hypertensive emergency. He has not been seen in follow-up since that time.  Past Medical History:  Diagnosis Date  . Asthma   . Diabetes mellitus without  complication ([place])   . Hypertension   . Renal disorder     History reviewed. No pertinent surgical history.   Prior to Admission medications   Medication Sig Start Date End Date Taking? Authorizing Provider  albuterol (PROVENTIL HFA;VENTOLIN HFA) 108 (90 Base) MCG/ACT inhaler Inhale 2 puffs into the lungs every 4 (four) hours as needed for wheezing or shortness of breath. 01/04/16   Janne Napoleon, NP  amLODipine (NORVASC) 10 MG tablet Take 1 tablet (10 mg total) by mouth daily. 01/17/18   Fransico Meadow, PA-C  atorvastatin (LIPITOR) 20 MG tablet Take 1 tablet (20 mg total) by mouth daily at 6 PM. 01/17/18   Fransico Meadow, PA-C  blood glucose meter kit and supplies KIT Dispense based on patient and insurance preference. Use up to four times daily as directed. (FOR ICD-9 250.00, 250.01). 12/14/16   Raiford Noble Latif, DO  carvedilol (COREG) 25 MG tablet Take 1 tablet (25 mg total) by mouth 2 (two) times daily with a meal. 01/17/18   Fransico Meadow, PA-C  chlorthalidone (HYGROTON) 25 MG tablet Take 1 tablet (25 mg total) by mouth daily. 01/17/18   Fransico Meadow, PA-C  metFORMIN (GLUCOPHAGE) 500 MG tablet Take 500 mg by mouth 2 (two) times daily with a meal.    [provider]    Inpatient Medications: Scheduled Meds: . amLODipine  10 mg Oral Daily  . atorvastatin  20 mg Oral q1800  . azithromycin  250 mg Oral Daily  . carvedilol  12.5 mg Oral BID WC  . cefdinir  300 mg Oral Daily  . enoxaparin (LOVENOX) injection  30  mg Subcutaneous Q24H  . folic acid  1 mg Oral Daily  . hydrocerin   Topical BID   Continuous Infusions:  PRN Meds: acetaminophen **OR** acetaminophen, benzonatate, guaiFENesin-dextromethorphan, ipratropium-albuterol, zolpidem  Allergies:   No Known Allergies  Social History:   Social History   Socioeconomic History  . Marital status: Single    Spouse name: Not on file  . Number of children: Not on file  . Years of education: Not on file  . Highest  education level: Not on file  Occupational History  . Not on file  Social Needs  . Financial resource strain: Not on file  . Food insecurity:    Worry: Not on file    Inability: Not on file  . Transportation needs:    Medical: Not on file    Non-medical: Not on file  Tobacco Use  . Smoking status: Never Smoker  . Smokeless tobacco: Never Used  Substance and Sexual Activity  . Alcohol use: Never    Frequency: Never    Comment: denies  . Drug use: Never    Types: Marijuana    Comment: denies  . Sexual activity: Not on file  Lifestyle  . Physical activity:    Days per week: Not on file    Minutes per session: Not on file  . Stress: Not on file  Relationships  . Social connections:    Talks on phone: Not on file    Gets together: Not on file    Attends religious service: Not on file    Active member of club or organization: Not on file    Attends meetings of clubs or organizations: Not on file    Relationship status: Not on file  . Intimate partner violence:    Fear of current or ex partner: Not on file    Emotionally abused: Not on file    Physically abused: Not on file    Forced sexual activity: Not on file  Other Topics Concern  . Not on file  Social History Narrative  . Not on file    Family History:   Family History  Problem Relation Age of Onset  . CAD Mother   . Hypertension Mother   . Diabetes Neg Hx   . Stroke Neg Hx   . Cancer Neg Hx   . Kidney failure Neg Hx    Family Status:  Family Status  Relation Name Status  . Mother  Alive  . Father  Deceased  . Neg Hx  (Not Specified)    ROS:  Please see the history of present illness.  All other ROS reviewed and negative.     Physical Exam/Data:   Vitals:   06/16/18 1900 06/16/18 1930 06/16/18 2036 06/17/18 0459  BP: (!) 164/111 (!) 158/110 (!) 128/94 (!) 139/94  Pulse: 88 87 87 78  Resp: (!) _0 Temp:   99.8 F (37.7 C) 98.1 F (36.7 C)  TempSrc:   Oral Oral  SpO2: 100% 95% 100%  99%  Weight:    112.3 kg  Height:        Intake/Output Summary (Last 24 hours) at 06/17/2018 1004 Last data filed at 06/17/2018 0920 Gross per 24 hour  Intake 1510 ml  Output 1850 ml  Net -340 ml   Filed Weights   06/16/18 1236 06/17/18 0459  Weight: 106.6 kg 112.3 kg   Body mass index is 32.67 kg/m.   General: Pleasant, well developed, well nourished, NAD Skin: Warm,  dry, intact  Head: Normocephalic, atraumatic, clear, moist mucus membranes. Neck: Negative for carotid bruits. No JVD Lungs: Diminished in bilateral lower lobes. No wheezes, rales, or rhonchi. Breathing is unlabored. Cardiovascular: RRR with S1 S2. No murmurs, rubs, gallops, or LV heave appreciated. Abdomen: Soft, non-tender, non-distended with normoactive bowel sounds. No obvious abdominal masses. MSK: Strength and tone appear normal for age. 5/5 in all extremities Extremities: 2+ BLE edema. No clubbing or cyanosis. DP/PT pulses 2+ bilaterally Neuro: Alert and oriented. No focal deficits. No facial asymmetry. MAE spontaneously. Psych: Responds to questions appropriately with normal affect.     EKG:  The EKG was personally reviewed and demonstrates:  Telemetry:  Telemetry was personally reviewed and demonstrates: 06/17/18 NSR   Relevant CV Studies:  ECHO: 12/12/2016 Study Conclusions  - Left ventricle: The cavity size was normal. Systolic function was   normal. The estimated ejection fraction was in the range of 55%   to 60%. Wall motion was normal; there were no regional wall   motion abnormalities. The study is not technically sufficient to   allow evaluation of LV diastolic function. - Aortic valve: Transvalvular velocity was within the normal range.   There was no stenosis. There was no regurgitation. - Mitral valve: Transvalvular velocity was within the normal range.   There was no evidence for stenosis. There was trivial   regurgitation. - Left atrium: The atrium was severely dilated. - Right  ventricle: The cavity size was normal. Wall thickness was   normal. Systolic function was normal. - Atrial septum: No defect or patent foramen ovale was identified   by color flow Doppler. - Tricuspid valve: There was no regurgitation.  CATH: None   Laboratory Data:  Chemistry Recent Labs  Lab 06/16/18 1541 06/16/18 1904 06/17/18 0444  NA 139  --  138  K 4.7  --  4.5  CL 110  --  107  CO2 20*  --  22  GLUCOSE 146*  --  109*  BUN 48*  --  53*  CREATININE 4.88* 4.87* 5.10*  CALCIUM 8.2*  --  8.2*  GFRNONAA 13* 13* 12*  GFRAA 15* 15* 14*  ANIONGAP 9  --  9    Total Protein  Date Value Ref Range Status  12/14/2016 6.3 (L) 6.5 - 8.1 g/dL Final   Albumin  Date Value Ref Range Status  12/14/2016 2.6 (L) 3.5 - 5.0 g/dL Final   AST  Date Value Ref Range Status  12/14/2016 25 15 - 41 U/L Final   ALT  Date Value Ref Range Status  12/14/2016 17 17 - 63 U/L Final   Alkaline Phosphatase  Date Value Ref Range Status  12/14/2016 48 38 - 126 U/L Final   Total Bilirubin  Date Value Ref Range Status  12/14/2016 0.6 0.3 - 1.2 mg/dL Final   Hematology Recent Labs  Lab 06/16/18 1541 06/16/18 1904 06/17/18 0444  WBC 9.3 8.6 6.8  RBC 3.25* 2.96* 2.88*  HGB 9.5* 8.8* 8.4*  HCT 30.6* 28.1* 27.3*  MCV 94.2 94.9 94.8  MCH 29.2 29.7 29.2  MCHC 31.0 31.3 30.8  RDW 14.1 14.0 14.0  PLT 186 173 161   Cardiac Enzymes Recent Labs  Lab 06/17/18 0444  TROPONINI 0.19*    Recent Labs  Lab 06/16/18 1548  TROPIPOC 0.20*    BNP Recent Labs  Lab 06/16/18 1559 06/16/18 1606  BNP 2,722.4* 2,805.4*    DDimer No results for input(s): DDIMER in the last 168 hours. TSH:  Lab  Results  Component Value Date   TSH 0.826 12/12/2016   Lipids: Lab Results  Component Value Date   CHOL 195 12/12/2016   HDL 32 (L) 12/12/2016   LDLCALC 139 (H) 12/12/2016   TRIG 119 12/12/2016   CHOLHDL 6.1 12/12/2016   HgbA1c: Lab Results  Component Value Date   HGBA1C 5.1 06/17/2018     Radiology/Studies:  Dg Chest 2 View  Result Date: 06/16/2018 CLINICAL DATA:  Lower extremity edema, cough, wheezing, symptoms for 1 day, history CHF, hypertension, asthma, diabetes mellitus, renal disorder EXAM: CHEST - 2 VIEW COMPARISON:  12/11/2016 FINDINGS: Enlargement of cardiac silhouette with pulmonary vascular congestion. Mediastinal contours normal. Slight accentuation of perihilar markings with consolidation in RIGHT lower lobe most consistent with pneumonia. Remaining lungs clear. No pleural effusion or pneumothorax. Bones unremarkable. IMPRESSION: Enlargement of cardiac silhouette with pulmonary vascular congestion. Slight chronic accentuation of pulmonary markings with new RIGHT lower lobe infiltrate consistent with pneumonia. Electronically Signed   By: Lavonia [name] M.D.   On: 06/16/2018 16:30   Assessment and Plan:   1.  Acute on chronic CHF exacerbation: -Patient presented with increased dyspnea on exertion, lower extremity swelling and cough for approximately 4 to 5 days.  Has been noncompliant with medications and reports he is not been taking his prescribed diuretics and has not yet established patient care with cardiology or nephrology since last hospitalization for acute on chronic diastolic CHF exacerbation in 01/2018. -Last echocardiogram 11/2016 with LVEF 55 to 60% with LVEF of 55 to 60% with no wall motion abnormalities and adequate valve function -EKG with NSR with new T wave inversions in leads I, II, III, aVF and V3-V6, change from prior tracing on 12/11/2016>>no complaints of chest pain -BNP greater than 2800 with CXR with pulmonary vascular congestion -Troponin mildly elevated likely in the setting of significant fluid volume overload>> however continue to trend -Weight, 247.6lb today with an admission weight of 235lb -I&O, net -340 mL since hospital admission -Given 1 dose IV Lasix 80 mg in the emergency department with 1 additional dose of 40 mg IV Lasix -Will plan  IV Lasix 40 mg BID and monitor renal function closely -BMET in AM  -MD to follow with final recommendations  2.  Possible right LLL PNA: -CXR on presentation concerning for right lower lobe PNA -Being treated empirically with IV antibiotics per IM  3.  CKD stage III: -Creatinine, 5.10 with a presenting creatinine at 4.87 -Baseline appears to be in the 2.2- 4.0 range -Patient was to follow with nephrology in the outpatient setting however does not appear that this has occurred -Continue to avoid nephrotoxic medications -Consider nephrology consultation  4.  DM2: -Stable, SSI for glucose control while inpatient status -Per IM  5.  HTN: -Stable, 139/94, 128/94, 150/110 -Continue amlodipine 10, carvedilol 12.5, -ACE on hold secondary to worsening renal function  For questions or updates, please contact [place] Please consult [email or web address] for contact info under Cardiology/STEMI.   Lyndel Safe NP-C [place] Pager: [phone] 06/17/2018 10:04 AM  The patient was seen, examined and discussed with Kathyrn Drown, NP  and I agree with the above.   47 year old male, patient of Dr. Marlou Porch with hx of diastolic CHF, history of CKD stage III, DM 2, HTN and history of medication noncompliance who was admitted with acute on chronic CHF secondary to medication noncompliance.  He states that since July he has been noncompliant with his prescribed diuretics and antihypertensives and has not yet established patient care with  cardiology or nephrology since last hospitalization for acute on chronic diastolic CHF exacerbation in 01/2018.  He presented with progressively worsening dyspnea on exertion but no chest pain. On presentation patient had elevated JVDs, bilateral crackles, bilateral lower extremity edema. He denies chest pain, diaphoresis, nausea, vomiting, fever, chills, palpitations or syncope. His labs show CKD stage V with GFR of 12, BNP of 2800, troponin elevated  0.19-->0.18. Hemoglobin 8.4, potassium 4.4. Cardiogram showed moderate concentric LVH, moderately dilated LV, LVEF 25 to 30% with diffuse hypokinesis grade 2 diastolic dysfunction with elevated filling pressures and mild pulmonary pressure hypertension.  Assessment and plan: Acute on chronic combined systolic and diastolic CHF with new severe LV systolic dysfunction secondary to medication noncompliance Hypertensive heart disease with CHF Acute on chronic CKD stage V Medication noncompliance Anemia of chronic disease  Plan: Increase furosemide to 60 mg IV every 6 hours Nephrology consult Start hydralazine/Imdur is unable to use ACE or ARB Continue carvedilol  Ena Dawley, MD 06/17/2018

## 2018-06-17 NOTE — Progress Notes (Signed)
  Echocardiogram 2D Echocardiogram has been performed.  Darlina Sicilian M 06/17/2018, 12:58 PM

## 2018-06-17 NOTE — Progress Notes (Signed)
PROGRESS NOTE    Corey Park  KCL:[id number] DOB: [birth year] DOA: 06/16/2018 PCP: Patient, No Pcp Per   Brief Narrative:  47 year old with history of diastolic CHF, CKD stage III-4, diabetes mellitus type 2, essential hypertension, medical noncompliance came to the hospital with complaints of shortness of breath and bilateral lower extremity edema..  Patient admits of being noncompliant with his medications for the past several months and has not followed up with cardiology or nephrology since his previous hospitalization.  In the ER he was noted to have some new T wave inversion in the inferior lateral leads.  Chest x-ray was suggestive of fluid overload.  He was started on Lasix.   Assessment & Plan:   Principal Problem:   Acute diastolic (congestive) heart failure (HCC) Active Problems:   DM (diabetes mellitus), type 2 (HCC)   HLD (hyperlipidemia)   Essential hypertension   CKD (chronic kidney disease), stage V (HCC)  Dyspnea on exertion secondary to pulmonary edema Acute on chronic diastolic congestive heart failure, ejection fraction 55 to 60%, grade 3 Abnormal EKG with new T wave inversion -Currently patient is chest pain-free.  He is diuresing well with IV Lasix.  We will plan on continuing this for today. -Closely monitor renal function, monitor urine output - Daily weight, fluid restriction  -Echocardiogram 11/2016-ejection fraction 55 to 60% without wall motion abnormalities  Acute kidney injury on CKD stage III-4 -I suspect his renal function has worsened during some time.  Due to noncompliance issues.  His new baseline might be 4.8.  He needs to follow-up outpatient with nephrology.  Avoid nephrotoxic drugs.  Concerns for right lower lobe pneumonia -We will check procalcitonin.  Empiric antibiotics for now.  Low threshold to stop it -Incentive spirometry and flutter valve  Essential hypertension -Continue Norvasc 10 mg daily, Coreg 12.5 mg  Diabetes mellitus  type 2 -Insulin sliding scale and Accu-Chek  DVT prophylaxis: Lovenox Code Status: Full code Family Communication: None at bedside Disposition Plan: Maintain inpatient stay for IV diuresis  Consultants:   Cardiology  Procedures:   None  Antimicrobials:   Rocephin and azithromycin   Subjective: Patient states he feels slightly better after being diuresed overnight.  Still has some exertional shortness of breath when he walks to the bathroom.  Review of Systems Otherwise negative except as per HPI, including: General: Denies fever, chills, night sweats or unintended weight loss. Resp: Denies cough, wheezing Cardiac: Denies chest pain, palpitations, orthopnea, paroxysmal nocturnal dyspnea. GI: Denies abdominal pain, nausea, vomiting, diarrhea or constipation GU: Denies dysuria, frequency, hesitancy or incontinence MS: Denies muscle aches, joint pain or swelling Neuro: Denies headache, neurologic deficits (focal weakness, numbness, tingling), abnormal gait Psych: Denies anxiety, depression, SI/HI/AVH Skin: Denies new rashes or lesions ID: Denies sick contacts, exotic exposures, travel  Objective: Vitals:   06/16/18 1900 06/16/18 1930 06/16/18 2036 06/17/18 0459  BP: (!) 164/111 (!) 158/110 (!) 128/94 (!) 139/94  Pulse: 88 87 87 78  Resp: (!) 22 20 18 17   Temp:   99.8 F (37.7 C) 98.1 F (36.7 C)  TempSrc:   Oral Oral  SpO2: 100% 95% 100% 99%  Weight:    112.3 kg  Height:        Intake/Output Summary (Last 24 hours) at 06/17/2018 1101 Last data filed at 06/17/2018 0920 Gross per 24 hour  Intake 1510 ml  Output 1850 ml  Net -340 ml   Filed Weights   06/16/18 1236 06/17/18 0459  Weight: 106.6 kg 112.3 kg  Examination:  General exam: Appears calm and comfortable  Respiratory system: Slightly diminished breath sounds at the bases and some crackles Cardiovascular system: S1 & S2 heard, RRR. No JVD, murmurs, rubs, gallops or clicks.  2+ bilateral lower  extremity pitting edema Gastrointestinal system: Abdomen is nondistended, soft and nontender. No organomegaly or masses felt. Normal bowel sounds heard. Central nervous system: Alert and oriented. No focal neurological deficits. Extremities: Symmetric 5 x 5 power. Skin: No rashes, lesions or ulcers Psychiatry: Judgement and insight appear normal. Mood & affect appropriate.     Data Reviewed:   CBC: Recent Labs  Lab 06/16/18 1541 06/16/18 1904 06/17/18 0444  WBC 9.3 8.6 6.8  NEUTROABS 7.8*  --   --   HGB 9.5* 8.8* 8.4*  HCT 30.6* 28.1* 27.3*  MCV 94.2 94.9 94.8  PLT 186 173 765   Basic Metabolic Panel: Recent Labs  Lab 06/16/18 1541 06/16/18 1904 06/17/18 0444  NA 139  --  138  K 4.7  --  4.5  CL 110  --  107  CO2 20*  --  22  GLUCOSE 146*  --  109*  BUN 48*  --  53*  CREATININE 4.88* 4.87* 5.10*  CALCIUM 8.2*  --  8.2*  MG  --   --  1.8   GFR: Estimated Creatinine Clearance: 23.5 mL/min (A) (by C-G formula based on SCr of 5.1 mg/dL (H)). Liver Function Tests: No results for input(s): AST, ALT, ALKPHOS, BILITOT, PROT, ALBUMIN in the last 168 hours. No results for input(s): LIPASE, AMYLASE in the last 168 hours. No results for input(s): AMMONIA in the last 168 hours. Coagulation Profile: No results for input(s): INR, PROTIME in the last 168 hours. Cardiac Enzymes: Recent Labs  Lab 06/17/18 0444  TROPONINI 0.19*   BNP (last 3 results) No results for input(s): PROBNP in the last 8760 hours. HbA1C: Recent Labs    06/17/18 0444  HGBA1C 5.1   CBG: No results for input(s): GLUCAP in the last 168 hours. Lipid Profile: Recent Labs    06/17/18 0444  CHOL 127  HDL 29*  LDLCALC 86  TRIG 59  CHOLHDL 4.4   Thyroid Function Tests: No results for input(s): TSH, T4TOTAL, FREET4, T3FREE, THYROIDAB in the last 72 hours. Anemia Panel: No results for input(s): VITAMINB12, FOLATE, FERRITIN, TIBC, IRON, RETICCTPCT in the last 72 hours. Sepsis Labs: Recent Labs    Lab 06/17/18 0444  PROCALCITON 0.19    No results found for this or any previous visit (from the past 240 hour(s)).       Radiology Studies: Dg Chest 2 View  Result Date: 06/16/2018 CLINICAL DATA:  Lower extremity edema, cough, wheezing, symptoms for 1 day, history CHF, hypertension, asthma, diabetes mellitus, renal disorder EXAM: CHEST - 2 VIEW COMPARISON:  12/11/2016 FINDINGS: Enlargement of cardiac silhouette with pulmonary vascular congestion. Mediastinal contours normal. Slight accentuation of perihilar markings with consolidation in RIGHT lower lobe most consistent with pneumonia. Remaining lungs clear. No pleural effusion or pneumothorax. Bones unremarkable. IMPRESSION: Enlargement of cardiac silhouette with pulmonary vascular congestion. Slight chronic accentuation of pulmonary markings with new RIGHT lower lobe infiltrate consistent with pneumonia. Electronically Signed   By: Lavonia [name] M.D.   On: 06/16/2018 16:30        Scheduled Meds: . amLODipine  10 mg Oral Daily  . atorvastatin  20 mg Oral q1800  . azithromycin  250 mg Oral Daily  . carvedilol  12.5 mg Oral BID WC  . cefdinir  300  mg Oral Daily  . enoxaparin (LOVENOX) injection  30 mg Subcutaneous Q24H  . folic acid  1 mg Oral Daily  . furosemide  40 mg Intravenous BID  . hydrocerin   Topical BID   Continuous Infusions:   LOS: 1 day   Time spent= 40 mins    Ankit Arsenio Loader, MD [facility] Pager [phone]   If 7PM-7AM, please contact night-coverage [email or web address] Password [email or web address] 06/17/2018, 11:01 AM

## 2018-06-17 NOTE — Care Management Note (Signed)
Case Management Note  Patient Details  Name: Corey Park MRN: [id number] Date of Birth: 08/19/1970  Subjective/Objective: CHF. From home. CM referral for [facility] needs. Patient doesn't have a pcp-provided w/pcp listing-encouraged [facility] pcp's. Patient aware he can get meds @ [facility] if has pcp appt. [facility] has resources for [employer]. No further CM needs.                   Action/Plan:dc home.   Expected Discharge Date:                  Expected Discharge Plan:  Home/Self Care  In-House Referral:     Discharge [facility]  Post Acute Care Choice:    Choice offered to:     DME Arranged:    DME Agency:     HH Arranged:    [place] Agency:     Status of Service:  In process, will continue to follow  If discussed at Long Length of Stay Meetings, dates discussed:    Additional Comments:  Dessa Phi, RN 06/17/2018, 4:33 PM

## 2018-06-18 ENCOUNTER — Inpatient Hospital Stay (HOSPITAL_COMMUNITY): Payer: Medicaid Other

## 2018-06-18 DIAGNOSIS — I509 Heart failure, unspecified: Secondary | ICD-10-CM

## 2018-06-18 DIAGNOSIS — R778 Other specified abnormalities of plasma proteins: Secondary | ICD-10-CM

## 2018-06-18 DIAGNOSIS — N189 Chronic kidney disease, unspecified: Secondary | ICD-10-CM

## 2018-06-18 DIAGNOSIS — R7989 Other specified abnormal findings of blood chemistry: Secondary | ICD-10-CM

## 2018-06-18 DIAGNOSIS — N179 Acute kidney failure, unspecified: Secondary | ICD-10-CM

## 2018-06-18 LAB — RAPID URINE DRUG SCREEN, HOSP PERFORMED
AMPHETAMINES: NOT DETECTED
Barbiturates: NOT DETECTED
Benzodiazepines: NOT DETECTED
Cocaine: NOT DETECTED
Opiates: NOT DETECTED
Tetrahydrocannabinol: NOT DETECTED

## 2018-06-18 LAB — BASIC METABOLIC PANEL
Anion gap: 10 (ref 5–15)
Anion gap: 9 (ref 5–15)
BUN: 59 mg/dL — ABNORMAL HIGH (ref 6–20)
BUN: 61 mg/dL — AB (ref 6–20)
CALCIUM: 8.2 mg/dL — AB (ref 8.9–10.3)
CO2: 23 mmol/L (ref 22–32)
CO2: 24 mmol/L (ref 22–32)
Calcium: 8 mg/dL — ABNORMAL LOW (ref 8.9–10.3)
Chloride: 107 mmol/L (ref 98–111)
Chloride: 106 mmol/L (ref 98–111)
Creatinine, Ser: 5.54 mg/dL — ABNORMAL HIGH (ref 0.61–1.24)
Creatinine, Ser: 5.71 mg/dL — ABNORMAL HIGH (ref 0.61–1.24)
GFR calc Af Amer: 13 mL/min — ABNORMAL LOW (ref >60)
GFR calc Af Amer: 13 mL/min — ABNORMAL LOW (ref >60)
GFR calc non Af Amer: 11 mL/min — ABNORMAL LOW (ref >60)
GFR calc non Af Amer: 11 mL/min — ABNORMAL LOW (ref >60)
Glucose, Bld: 104 mg/dL — ABNORMAL HIGH (ref 70–99)
Glucose, Bld: 189 mg/dL — ABNORMAL HIGH (ref 70–99)
Potassium: 4.5 mmol/L (ref 3.5–5.1)
Potassium: 4.1 mmol/L (ref 3.5–5.1)
Sodium: 140 mmol/L (ref 135–145)
Sodium: 139 mmol/L (ref 135–145)

## 2018-06-18 LAB — CBC
HCT: 27.3 % — ABNORMAL LOW (ref 39.0–52.0)
Hemoglobin: 8.7 g/dL — ABNORMAL LOW (ref 13.0–17.0)
MCH: 30.2 pg (ref 26.0–34.0)
MCHC: 31.9 g/dL (ref 30.0–36.0)
MCV: 94.8 fL (ref 80.0–100.0)
Platelets: 189 10*3/uL (ref 150–400)
RBC: 2.88 MIL/uL — ABNORMAL LOW (ref 4.22–5.81)
RDW: 14.1 % (ref 11.5–15.5)
WBC: 6.9 10*3/uL (ref 4.0–10.5)
nRBC: 0 % (ref 0.0–0.2)

## 2018-06-18 LAB — IRON AND TIBC
Iron: 20 ug/dL — ABNORMAL LOW (ref 45–182)
Saturation Ratios: 7 % — ABNORMAL LOW (ref 17.9–39.5)
TIBC: 273 ug/dL (ref 250–450)
UIBC: 253 ug/dL

## 2018-06-18 LAB — MAGNESIUM: Magnesium: 1.8 mg/dL (ref 1.7–2.4)

## 2018-06-18 LAB — BRAIN NATRIURETIC PEPTIDE: B Natriuretic Peptide: 1763.6 pg/mL — ABNORMAL HIGH (ref 0.0–100.0)

## 2018-06-18 LAB — FERRITIN: Ferritin: 52 ng/mL (ref 24–336)

## 2018-06-18 LAB — VITAMIN B12: Vitamin B-12: 289 pg/mL (ref 180–914)

## 2018-06-18 MED ORDER — TRAZODONE HCL 50 MG PO TABS
50.0000 mg | ORAL_TABLET | Freq: Every evening | ORAL | Status: DC | PRN
  Administered 2018-06-19: 50 mg via ORAL
  Filled 2018-06-18: qty 1

## 2018-06-18 MED ORDER — CARVEDILOL 25 MG PO TABS
25.0000 mg | ORAL_TABLET | Freq: Two times a day (BID) | ORAL | Status: DC
  Administered 2018-06-18 – 2018-06-19 (×2): 25 mg via ORAL
  Filled 2018-06-18 (×2): qty 1

## 2018-06-18 MED ORDER — FUROSEMIDE 40 MG PO TABS
80.0000 mg | ORAL_TABLET | Freq: Two times a day (BID) | ORAL | Status: DC
  Administered 2018-06-18 – 2018-06-19 (×2): 80 mg via ORAL
  Filled 2018-06-18 (×3): qty 2

## 2018-06-18 MED ORDER — VALACYCLOVIR HCL 500 MG PO TABS
500.0000 mg | ORAL_TABLET | Freq: Every day | ORAL | Status: DC
  Administered 2018-06-18 – 2018-06-19 (×2): 500 mg via ORAL
  Filled 2018-06-18 (×2): qty 1

## 2018-06-18 MED ORDER — ISOSORBIDE MONONITRATE ER 60 MG PO TB24
60.0000 mg | ORAL_TABLET | Freq: Every day | ORAL | Status: DC
  Administered 2018-06-19: 60 mg via ORAL
  Filled 2018-06-18: qty 1

## 2018-06-18 MED ORDER — AMLODIPINE BESYLATE 10 MG PO TABS
10.0000 mg | ORAL_TABLET | Freq: Every day | ORAL | Status: DC
  Administered 2018-06-19: 10 mg via ORAL
  Filled 2018-06-18: qty 1

## 2018-06-18 MED ORDER — FUROSEMIDE 10 MG/ML IJ SOLN: 60.0000 mg | Freq: Three times a day (TID) | INTRAMUSCULAR | Status: DC

## 2018-06-18 NOTE — Progress Notes (Addendum)
Progress Note  Patient Name: Corey Park Date of Encounter: 06/18/2018  Primary Cardiologist: Dr. Candee Furbish, MD  Subjective   Pt feeling well today. Denies chest pain or SOB. States his swelling has improved.   Inpatient Medications    Scheduled Meds: . amLODipine  5 mg Oral Daily  . atorvastatin  20 mg Oral q1800  . azithromycin  250 mg Oral Daily  . carvedilol  12.5 mg Oral BID WC  . cefdinir  300 mg Oral Daily  . enoxaparin (LOVENOX) injection  30 mg Subcutaneous Q24H  . folic acid  1 mg Oral Daily  . furosemide  60 mg Intravenous Q6H  . hydrALAZINE  25 mg Oral TID  . hydrocerin   Topical BID  . isosorbide mononitrate  15 mg Oral Daily   Continuous Infusions:  PRN Meds: acetaminophen **OR** acetaminophen, benzonatate, guaiFENesin-dextromethorphan, ipratropium-albuterol, zolpidem   Vital Signs    Vitals:   06/17/18 1401 06/17/18 2114 06/18/18 0554 06/18/18 0700  BP: (!) 144/89 (!) 136/101 (!) 167/97   Pulse: 77 81 80   Resp: 18 17 16    Temp: 98.7 F (37.1 C) 98.7 F (37.1 C) 99.2 F (37.3 C)   TempSrc: Oral Oral Oral   SpO2: 98% 99% 99%   Weight:    104.3 kg  Height:        Intake/Output Summary (Last 24 hours) at 06/18/2018 0915 Last data filed at 06/18/2018 0913 Gross per 24 hour  Intake 1790 ml  Output 2275 ml  Net -485 ml   Filed Weights   06/16/18 1236 06/17/18 0459 06/18/18 0700  Weight: 106.6 kg 112.3 kg 104.3 kg    Physical Exam   General: Well developed, well nourished, NAD Skin: Warm, dry, intact  Head: Normocephalic, atraumatic, clear, moist mucus membranes. Neck: Negative for carotid bruits. No JVD Lungs:Clear to ausculation bilaterally. No wheezes, rales, or rhonchi. Breathing is unlabored. Cardiovascular: RRR with S1 S2. No murmurs, rubs, gallops, or LV heave appreciated. Abdomen: Soft, non-tender, non-distended with normoactive bowel sounds. No obvious abdominal masses. MSK: Strength and tone appear normal for age.  5/5 in all extremities Extremities: 1+ BLE edema. No clubbing or cyanosis. DP/PT pulses 2+ bilaterally Neuro: Alert and oriented. No focal deficits. No facial asymmetry. MAE spontaneously. Psych: Responds to questions appropriately with normal affect.    Labs    Chemistry Recent Labs  Lab 06/17/18 0444 06/17/18 1144 06/18/18 0558  NA 138 137 140  K 4.5 4.4 4.5  CL 107 110 107  CO2 22 22 23   GLUCOSE 109* 159* 104*  BUN 53* 53* 59*  CREATININE 5.10* 5.31* 5.54*  CALCIUM 8.2* 7.9* 8.2*  GFRNONAA 12* 12* 11*  GFRAA 14* 14* 13*  ANIONGAP 9 5 10      Hematology Recent Labs  Lab 06/16/18 1904 06/17/18 0444 06/18/18 0558  WBC 8.6 6.8 6.9  RBC 2.96* 2.88* 2.88*  HGB 8.8* 8.4* 8.7*  HCT 28.1* 27.3* 27.3*  MCV 94.9 94.8 94.8  MCH 29.7 29.2 30.2  MCHC 31.3 30.8 31.9  RDW 14.0 14.0 14.1  PLT 173 161 189    Cardiac Enzymes Recent Labs  Lab 06/17/18 0444 06/17/18 1144  TROPONINI 0.19* 0.18*    Recent Labs  Lab 06/16/18 1548  TROPIPOC 0.20*     BNP Recent Labs  Lab 06/16/18 1559 06/16/18 1606 06/18/18 0558  BNP 2,722.4* 3,086.5* 1,763.6*     DDimer No results for input(s): DDIMER in the last 168 hours.   Radiology  Dg Chest 2 View  Result Date: 06/16/2018 CLINICAL DATA:  Lower extremity edema, cough, wheezing, symptoms for 1 day, history CHF, hypertension, asthma, diabetes mellitus, renal disorder EXAM: CHEST - 2 VIEW COMPARISON:  12/11/2016 FINDINGS: Enlargement of cardiac silhouette with pulmonary vascular congestion. Mediastinal contours normal. Slight accentuation of perihilar markings with consolidation in RIGHT lower lobe most consistent with pneumonia. Remaining lungs clear. No pleural effusion or pneumothorax. Bones unremarkable. IMPRESSION: Enlargement of cardiac silhouette with pulmonary vascular congestion. Slight chronic accentuation of pulmonary markings with new RIGHT lower lobe infiltrate consistent with pneumonia. Electronically Signed   By:  Lavonia [name] M.D.   On: 06/16/2018 16:30   Telemetry    06/18/18 NSR- Personally Reviewed  ECG    No new tracing as of 06/18/18 - Personally Reviewed  Cardiac Studies   ECHO: 12/12/2016 Study Conclusions  - Left ventricle: The cavity size was normal. Systolic function was normal. The estimated ejection fraction was in the range of 55% to 60%. Wall motion was normal; there were no regional wall motion abnormalities. The study is not technically sufficient to allow evaluation of LV diastolic function. - Aortic valve: Transvalvular velocity was within the normal range. There was no stenosis. There was no regurgitation. - Mitral valve: Transvalvular velocity was within the normal range. There was no evidence for stenosis. There was trivial regurgitation. - Left atrium: The atrium was severely dilated. - Right ventricle: The cavity size was normal. Wall thickness was normal. Systolic function was normal. - Atrial septum: No defect or patent foramen ovale was identified by color flow Doppler. - Tricuspid valve: There was no regurgitation.  CATH: None   Patient Profile     47 y.o. male with a hx of diastolic CHF, history of CKD stage III, DM 2, HTN and history of medication noncompliance who is being seen today for the evaluation of shortness of breath and BLE at the request of Dr. Hampton Abbot.  Assessment & Plan    1.  Acute on chronic CHF exacerbation: -Last echocardiogram 11/2016 with LVEF 55 to 60% with LVEF of 55 to 60% with no wall motion abnormalities and adequate valve function -EKG with NSR with new T wave inversions in leads I, II, III, aVF and V3-V6, change from prior tracing on 12/11/2016>>no complaints of chest pain -BNP greater than 2800 with CXR with pulmonary vascular congestion on presentation  -Troponin mildly elevated (0.19>0.18) likely in the setting of significant fluid volume overload without ACS symptoms  -Weight, 230lb today, down from 247.6lb  06/17/18 -I&O, net -960 mL since hospital admission. Total 24H output 2.6L -Will decrease IV lasix to 60mg  Q8H from Q6H given worsening renal function  -BMET in AM  -MD to follow with final recommendations  2.  Possible right LLL PNA: -CXR on presentation concerning for right lower lobe PNA -Being treated empirically with IV antibiotics per IM  3.  CKD stage III: -Creatinine, 5.54 today, up from 5.10 with a presenting creatinine at 4.87 -Baseline appears to be in the 2.2- 4.0 range -Patient was to follow with nephrology in the outpatient setting however does not appear that this has occurred -Continue to avoid nephrotoxic medications -Consider nephrology consultation  4.  DM2: -Stable, SSI for glucose control while inpatient status -Per IM  5.  HTN: -Elevated, 167/97>136/101>144/89 -Will increase amlodipine to 10 and increase carvedilol to 25 given persistently elevated BP  -Continue IMDUR 15, hydralazine 25 TID -ACE on hold secondary to worsening renal function   Signed, Kathyrn Drown NP-C [facility]  Pager: [phone] 06/18/2018, 9:15 AM     For questions or updates, please contact   Please consult [email or web address] for contact info under Cardiology/STEMI.  The patient was seen, examined and discussed with Kathyrn Drown, NP  and I agree with the above.   47 year old male, patient of Dr. Marlou Porch with hx of diastolic CHF, history of CKD stage III, DM 2, HTN and history of medication noncompliance who was admitted with acute on chronic CHF secondary to medication noncompliance. His labs show CKD stage V with GFR of 12, BNP of 2800, troponin elevated 0.19-->0.18. Hemoglobin 8.4, potassium 4.4. Cardiogram showed moderate concentric LVH, moderately dilated LV, LVEF 25 to 30% with diffuse hypokinesis grade 2 diastolic dysfunction with elevated filling pressures and mild pulmonary pressure hypertension. The patient feels back to normal today and is motivated to get better with  meds and diet.  Assessment and plan: Acute on chronic combined systolic and diastolic CHF with new severe LV systolic dysfunction secondary to medication noncompliance Hypertensive heart disease with CHF Acute on chronic CKD stage V Medication noncompliance Anemia of chronic disease  Plan: His new baseline weight 230 lbs.  He can be discharged today, we will arrange for a follow up. Switch lasix to 80 mg PO BID Continue carvedilol, hydralazine, increase imdur to 60 mg po daily Nephrology consult done - they will arrange for vascular access for HD and outpatient follow up Case management consult for orange card application Dietitian consult  Ena Dawley, MD 06/18/2018

## 2018-06-18 NOTE — Consult Note (Signed)
Reason for Consult: Progressive CKD  Referring Physician: Reesa Chew, MD  Crit Obremski is an 47 y.o. male.  HPI: Mr. Kitchings is a 47yo AAM with PMH significantly for long-standing, poorly controlled DM, HTN, noncompliance with medications and follow up, diastolic CHF, and progressive CKD stage 4 who presented to [facility] ED on 06/16/18 with increased DOE/SOB, lower extremity edema, and cough.  He admitted that he has not been taking his prescribed medications (including lasix) and has not established Cardiology or Nephrology care.  We were consulted due to worsening of his renal function over the past year and a half.  The trend in Scr is seen below.  His CKD is presumably due to poorly controlled DM and HTN.  He was referred to our office in 2018 due to his elevated Cr and HTN, however he never showed up for the appointment.  He does not take medications nor follow up with physicians on a regular basis.     He denies any nausea, vomiting, funny metallic taste, pruritis, dysuria, pyuria, hematuria, urgency, frequency, or retention.  He does have a family history of ESRD with his maternal grandmother.  Denies any NSAID use or illicit drug use.  Trend in Creatinine: Creatinine, Ser  Date/Time Value Ref Range Status  06/18/2018 05:58 AM 5.54 (H) 0.61 - 1.24 mg/dL Final  06/17/2018 11:44 AM 5.31 (H) 0.61 - 1.24 mg/dL Final  06/17/2018 04:44 AM 5.10 (H) 0.61 - 1.24 mg/dL Final  06/16/2018 07:04 PM 4.87 (H) 0.61 - 1.24 mg/dL Final  06/16/2018 03:41 PM 4.88 (H) 0.61 - 1.24 mg/dL Final  01/27/2018 01:09 PM 4.10 (H) 0.61 - 1.24 mg/dL Final  12/14/2016 05:51 AM 2.61 (H) 0.61 - 1.24 mg/dL Final  12/13/2016 07:01 AM 2.56 (H) 0.61 - 1.24 mg/dL Final  12/12/2016 04:41 AM 2.27 (H) 0.61 - 1.24 mg/dL Final  12/11/2016 07:53 PM 2.25 (H) 0.61 - 1.24 mg/dL Final  09/19/2014 11:07 PM 1.38 (H) 0.50 - 1.35 mg/dL Final  06/11/2007 10:12 PM 1.32 0.40 - 1.50 mg/dL Final    PMH:   Past Medical History:  Diagnosis Date  .  Asthma   . Diabetes mellitus without complication ([place])   . Hypertension   . Renal disorder     PSH:  History reviewed. No pertinent surgical history.  Allergies: No Known Allergies  Medications:   Prior to Admission medications   Medication Sig Start Date End Date Taking? Authorizing Provider  albuterol (PROVENTIL HFA;VENTOLIN HFA) 108 (90 Base) MCG/ACT inhaler Inhale 2 puffs into the lungs every 4 (four) hours as needed for wheezing or shortness of breath. 01/04/16   Janne Napoleon, NP  amLODipine (NORVASC) 10 MG tablet Take 1 tablet (10 mg total) by mouth daily. 01/17/18   Fransico Meadow, PA-C  atorvastatin (LIPITOR) 20 MG tablet Take 1 tablet (20 mg total) by mouth daily at 6 PM. 01/17/18   Fransico Meadow, PA-C  blood glucose meter kit and supplies KIT Dispense based on patient and insurance preference. Use up to four times daily as directed. (FOR ICD-9 250.00, 250.01). 12/14/16   Raiford Noble Latif, DO  carvedilol (COREG) 25 MG tablet Take 1 tablet (25 mg total) by mouth 2 (two) times daily with a meal. 01/17/18   Fransico Meadow, PA-C  chlorthalidone (HYGROTON) 25 MG tablet Take 1 tablet (25 mg total) by mouth daily. 01/17/18   Fransico Meadow, PA-C  metFORMIN (GLUCOPHAGE) 500 MG tablet Take 500 mg by mouth 2 (two) times daily with a meal.  [provider]    Inpatient medications: . [START ON 06/19/2018] amLODipine  10 mg Oral Daily  . atorvastatin  20 mg Oral q1800  . azithromycin  250 mg Oral Daily  . carvedilol  25 mg Oral BID WC  . cefdinir  300 mg Oral Daily  . enoxaparin (LOVENOX) injection  30 mg Subcutaneous Q24H  . folic acid  1 mg Oral Daily  . furosemide  60 mg Intravenous TID  . hydrALAZINE  25 mg Oral TID  . hydrocerin   Topical BID  . isosorbide mononitrate  15 mg Oral Daily    Discontinued Meds:   Medications Discontinued During This Encounter  Medication Reason  . cefdinir (OMNICEF) capsule 300 mg   . furosemide (LASIX) injection 40 mg   .  amLODipine (NORVASC) tablet 10 mg   . furosemide (LASIX) injection 60 mg   . amLODipine (NORVASC) tablet 5 mg   . carvedilol (COREG) tablet 12.5 mg     Social History:  reports that he has never smoked. He has never used smokeless tobacco. He reports that he does not drink alcohol or use drugs.  Family History:   Family History  Problem Relation Age of Onset  . CAD Mother   . Hypertension Mother   . Diabetes Neg Hx   . Stroke Neg Hx   . Cancer Neg Hx   . Kidney failure Neg Hx     Pertinent items are noted in HPI. Weight change: -2.268 kg  Intake/Output Summary (Last 24 hours) at 06/18/2018 1135 Last data filed at 06/18/2018 1000 Gross per 24 hour  Intake 1430 ml  Output 2500 ml  Net -1070 ml   BP (!) 167/97 (BP Location: Right Arm)   Pulse 80   Temp 99.2 F (37.3 C) (Oral)   Resp 16   Ht _0  (1.854 m)   Wt 104.3 kg   SpO2 99%   BMI 30.34 kg/m  Vitals:   06/17/18 1401 06/17/18 2114 06/18/18 0554 06/18/18 0700  BP: (!) 144/89 (!) 136/101 (!) 167/97   Pulse: 77 81 80   Resp: _1 Temp: 98.7 F (37.1 C) 98.7 F (37.1 C) 99.2 F (37.3 C)   TempSrc: Oral Oral Oral   SpO2: 98% 99% 99%   Weight:    104.3 kg  Height:         General appearance: alert, cooperative and no distress Head: Normocephalic, without obvious abnormality, atraumatic Resp: clear to auscultation bilaterally Cardio: regular rate and rhythm, S1, S2 normal, no murmur, click, rub or gallop GI: soft, non-tender; bowel sounds normal; no masses,  no organomegaly Extremities: edema 1+ pretibial  Neuro: no asterixis  Labs: Basic Metabolic Panel: Recent Labs  Lab 06/16/18 1541 06/16/18 1904 06/17/18 0444 06/17/18 1144 06/18/18 0558  NA 139  --  138 137 140  K 4.7  --  4.5 4.4 4.5  CL 110  --  107 110 107  CO2 20*  --  _2 GLUCOSE 146*  --  109* 159* 104*  BUN 48*  --  53* 53* 59*  CREATININE 4.88* 4.87* 5.10* 5.31* 5.54*  CALCIUM 8.2*  --  8.2* 7.9* 8.2*   Liver Function  Tests: No results for input(s): AST, ALT, ALKPHOS, BILITOT, PROT, ALBUMIN in the last 168 hours. No results for input(s): LIPASE, AMYLASE in the last 168 hours. No results for input(s): AMMONIA in the last 168 hours. CBC: Recent Labs  Lab 06/16/18 1541 06/16/18  1904 06/17/18 0444 06/18/18 0558  WBC 9.3 8.6 6.8 6.9  NEUTROABS 7.8*  --   --   --   HGB 9.5* 8.8* 8.4* 8.7*  HCT 30.6* 28.1* 27.3* 27.3*  MCV 94.2 94.9 94.8 94.8  PLT 186 173 161 189   PT/INR: _0 (inr:5) Cardiac Enzymes: ) Recent Labs  Lab 06/17/18 0444 06/17/18 1144  TROPONINI 0.19* 0.18*   CBG: No results for input(s): GLUCAP in the last 168 hours.  Iron Studies: No results for input(s): IRON, TIBC, TRANSFERRIN, FERRITIN in the last 168 hours.  Xrays/Other Studies: Dg Chest 2 View  Result Date: 06/16/2018 CLINICAL DATA:  Lower extremity edema, cough, wheezing, symptoms for 1 day, history CHF, hypertension, asthma, diabetes mellitus, renal disorder EXAM: CHEST - 2 VIEW COMPARISON:  12/11/2016 FINDINGS: Enlargement of cardiac silhouette with pulmonary vascular congestion. Mediastinal contours normal. Slight accentuation of perihilar markings with consolidation in RIGHT lower lobe most consistent with pneumonia. Remaining lungs clear. No pleural effusion or pneumothorax. Bones unremarkable. IMPRESSION: Enlargement of cardiac silhouette with pulmonary vascular congestion. Slight chronic accentuation of pulmonary markings with new RIGHT lower lobe infiltrate consistent with pneumonia. Electronically Signed   By: Lavonia [name] M.D.   On: 06/16/2018 16:30     Assessment/Plan: 1. 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  Not sure how much improvement he will have but will need to educate about RRT.  Stressed importance of BP control (goal <130/80), Diabetes control (goal Hgb A1c <7%), and avoidance of nephrotoxic agents such as NSAIDs/Cox-II I's.  Will have him watch educational  videos about dialysis.  He will need [facility] referral and placement of an access as an outpatient if he will agree.  Currently without uremic symptoms. 2. Anemia of CKD stage 4.  Will check iron stores and start ESA therapy 3. DM- poorly controlled per primary 4. HTN with severe LV dysfunction.  Agree with addition of Bidil and Cardiology following.  Avoid ACE/ARB for now.  5. CHF- new drop in EF to 25-30% since last ECHO in 2018 (55-60%).  Cardiology consulted and on IV lasix.  Likely due to poorly controlled HTN 6. Medical noncompliance- stressed the importance of compliance with medications and medical follow up. 7. Secondary HPTH- will check phos, Vit D, and iPTH levels.    Broadus John A Pearlie Lafosse 06/18/2018, 11:35 AM

## 2018-06-18 NOTE — Progress Notes (Addendum)
PROGRESS NOTE    Corey Park  INO:[id number] DOB: [birth year] DOA: 06/16/2018 PCP: Patient, No Pcp Per   Brief Narrative:  47 year old with history of diastolic CHF, CKD stage III-4, diabetes mellitus type 2, essential hypertension, medical noncompliance came to the hospital with complaints of shortness of breath and bilateral lower extremity edema..  Patient admits of being noncompliant with his medications for the past several months and has not followed up with cardiology or nephrology since his previous hospitalization.  In the ER he was noted to have some new T wave inversion in the inferior lateral leads.  Chest x-ray was suggestive of fluid overload.  He was started on Lasix.   Assessment & Plan:   Principal Problem:   Acute diastolic (congestive) heart failure (HCC) Active Problems:   DM (diabetes mellitus), type 2 (HCC)   HLD (hyperlipidemia)   Essential hypertension   CKD (chronic kidney disease), stage V (HCC)  Dyspnea on exertion secondary to pulmonary edema Acute on chronic diastolic congestive heart failure, ejection fraction 55 to 60%, grade 3 Abnormal EKG with new T wave inversion -Currently patient is chest pain-free.  Decrease IV Lasix to every 8 hours 60 mg -Closely monitor renal function, monitor urine output - Daily weight, fluid restriction -Continue Coreg, dose increased to 25 mg.  On hydralazine and Imdur -Hemoglobin A1c is 5.1  -Echocardiogram 11/2016-ejection fraction 55 to 60% without wall motion abnormalities  Acute kidney injury on CKD stage III-4 -Worsening due to uncontrolled underlying diseases.  Nephrology consulted for their input  Concerns for right lower lobe pneumonia; low suspicion. -Procalcitonin specimen is negative, will stop the antibiotics. -Incentive spirometry and flutter valve  Essential hypertension -Continue Norvasc 10 mg daily, Coreg 25 mg  Update 7pm Rash behind his right ear appears to be vesicular in one dermatome in  diff stages. Likely Shingles. Will place on airborne precaution her hospital protocol? Start renally dose valtrex 1g q24hrs for 7 days orally.   DVT prophylaxis: Lovenox Code Status: Full code Family Communication: None at bedside Disposition Plan: Maintain inpatient stay for IV diuresis  Consultants:   Cardiology  Nephrology  Procedures:   None  Antimicrobials:   Rocephin and azithromycin stopped on 12/24   Subjective: Still has some exertional sob and lower extremity swelling. Feels better otherwise.   Review of Systems Otherwise negative except as per HPI, including: General = no fevers, chills, dizziness, malaise, fatigue HEENT/EYES = negative for pain, redness, loss of vision, double vision, blurred vision, loss of hearing, sore throat, hoarseness, dysphagia Cardiovascular= negative for chest pain, palpitation, murmurs, lower extremity swelling Respiratory/lungs= negative for  cough, hemoptysis, wheezing, mucus production Gastrointestinal= negative for nausea, vomiting,, abdominal pain, melena, hematemesis Genitourinary= negative for Dysuria, Hematuria, Change in Urinary Frequency MSK = Negative for arthralgia, myalgias, Back Pain, Joint swelling  Neurology= Negative for headache, seizures, numbness, tingling  Psychiatry= Negative for anxiety, depression, suicidal and homocidal ideation Allergy/Immunology= Medication/Food allergy as listed  Skin= Negative for Rash, lesions, ulcers, itching   Objective: Vitals:   06/17/18 1401 06/17/18 2114 06/18/18 0554 06/18/18 0700  BP: (!) 144/89 (!) 136/101 (!) 167/97   Pulse: 77 81 80   Resp: 18 17 16    Temp: 98.7 F (37.1 C) 98.7 F (37.1 C) 99.2 F (37.3 C)   TempSrc: Oral Oral Oral   SpO2: 98% 99% 99%   Weight:    104.3 kg  Height:        Intake/Output Summary (Last 24 hours) at 06/18/2018 1136 Last data filed  at 06/18/2018 1000 Gross per 24 hour  Intake 1430 ml  Output 2500 ml  Net -1070 ml   Filed Weights     06/16/18 1236 06/17/18 0459 06/18/18 0700  Weight: 106.6 kg 112.3 kg 104.3 kg    Examination:  Constitutional: NAD, calm, comfortable Eyes: PERRL, lids and conjunctivae normal ENMT: Mucous membranes are moist. Posterior pharynx clear of any exudate or lesions.Normal dentition.  Neck: normal, supple, no masses, no thyromegaly Respiratory: clear to auscultation bilaterally, no wheezing, no crackles. Normal respiratory effort. No accessory muscle use.  Cardiovascular: Regular rate and rhythm, no murmurs / rubs / gallops.  2+ bilateral lower extremity pitting edema. 2+ pedal pulses. No carotid bruits.  Abdomen: no tenderness, no masses palpated. No hepatosplenomegaly. Bowel sounds positive.  Musculoskeletal: no clubbing / cyanosis. No joint deformity upper and lower extremities. Good ROM, no contractures. Normal muscle tone.  Skin: no rashes, lesions, ulcers. No induration Neurologic: CN 2-12 grossly intact. Sensation intact, DTR normal. Strength 5/5 in all 4.  Psychiatric: Normal judgment and insight. Alert and oriented x 3. Normal mood.     Data Reviewed:   CBC: Recent Labs  Lab 06/16/18 1541 06/16/18 1904 06/17/18 0444 06/18/18 0558  WBC 9.3 8.6 6.8 6.9  NEUTROABS 7.8*  --   --   --   HGB 9.5* 8.8* 8.4* 8.7*  HCT 30.6* 28.1* 27.3* 27.3*  MCV 94.2 94.9 94.8 94.8  PLT 186 173 161 024   Basic Metabolic Panel: Recent Labs  Lab 06/16/18 1541 06/16/18 1904 06/17/18 0444 06/17/18 1144 06/18/18 0558  NA 139  --  138 137 140  K 4.7  --  4.5 4.4 4.5  CL 110  --  107 110 107  CO2 20*  --  22 22 23   GLUCOSE 146*  --  109* 159* 104*  BUN 48*  --  53* 53* 59*  CREATININE 4.88* 4.87* 5.10* 5.31* 5.54*  CALCIUM 8.2*  --  8.2* 7.9* 8.2*  MG  --   --  1.8  --  1.8   GFR: Estimated Creatinine Clearance: 20.9 mL/min (A) (by C-G formula based on SCr of 5.54 mg/dL (H)). Liver Function Tests: No results for input(s): AST, ALT, ALKPHOS, BILITOT, PROT, ALBUMIN in the last 168  hours. No results for input(s): LIPASE, AMYLASE in the last 168 hours. No results for input(s): AMMONIA in the last 168 hours. Coagulation Profile: No results for input(s): INR, PROTIME in the last 168 hours. Cardiac Enzymes: Recent Labs  Lab 06/17/18 0444 06/17/18 1144  TROPONINI 0.19* 0.18*   BNP (last 3 results) No results for input(s): PROBNP in the last 8760 hours. HbA1C: Recent Labs    06/17/18 0444  HGBA1C 5.1   CBG: No results for input(s): GLUCAP in the last 168 hours. Lipid Profile: Recent Labs    06/17/18 0444  CHOL 127  HDL 29*  LDLCALC 86  TRIG 59  CHOLHDL 4.4   Thyroid Function Tests: No results for input(s): TSH, T4TOTAL, FREET4, T3FREE, THYROIDAB in the last 72 hours. Anemia Panel: No results for input(s): VITAMINB12, FOLATE, FERRITIN, TIBC, IRON, RETICCTPCT in the last 72 hours. Sepsis Labs: Recent Labs  Lab 06/17/18 0444  PROCALCITON 0.19    No results found for this or any previous visit (from the past 240 hour(s)).       Radiology Studies: Dg Chest 2 View  Result Date: 06/16/2018 CLINICAL DATA:  Lower extremity edema, cough, wheezing, symptoms for 1 day, history CHF, hypertension, asthma, diabetes mellitus, renal  disorder EXAM: CHEST - 2 VIEW COMPARISON:  12/11/2016 FINDINGS: Enlargement of cardiac silhouette with pulmonary vascular congestion. Mediastinal contours normal. Slight accentuation of perihilar markings with consolidation in RIGHT lower lobe most consistent with pneumonia. Remaining lungs clear. No pleural effusion or pneumothorax. Bones unremarkable. IMPRESSION: Enlargement of cardiac silhouette with pulmonary vascular congestion. Slight chronic accentuation of pulmonary markings with new RIGHT lower lobe infiltrate consistent with pneumonia. Electronically Signed   By: Lavonia [name] M.D.   On: 06/16/2018 16:30        Scheduled Meds: . [START ON 06/19/2018] amLODipine  10 mg Oral Daily  . atorvastatin  20 mg Oral q1800  .  azithromycin  250 mg Oral Daily  . carvedilol  25 mg Oral BID WC  . cefdinir  300 mg Oral Daily  . enoxaparin (LOVENOX) injection  30 mg Subcutaneous Q24H  . folic acid  1 mg Oral Daily  . furosemide  60 mg Intravenous TID  . hydrALAZINE  25 mg Oral TID  . hydrocerin   Topical BID  . isosorbide mononitrate  15 mg Oral Daily   Continuous Infusions:   LOS: 2 days   Time spent= 40 mins    Shaune Malacara Arsenio Loader, MD [facility] Pager [phone]   If 7PM-7AM, please contact night-coverage [email or web address] Password [facility] 06/18/2018, 11:36 AM

## 2018-06-19 LAB — CBC
HCT: 28.1 % — ABNORMAL LOW (ref 39.0–52.0)
Hemoglobin: 8.8 g/dL — ABNORMAL LOW (ref 13.0–17.0)
MCH: 29.4 pg (ref 26.0–34.0)
MCHC: 31.3 g/dL (ref 30.0–36.0)
MCV: 94 fL (ref 80.0–100.0)
Platelets: 201 10*3/uL (ref 150–400)
RBC: 2.99 MIL/uL — ABNORMAL LOW (ref 4.22–5.81)
RDW: 13.8 % (ref 11.5–15.5)
WBC: 5.6 10*3/uL (ref 4.0–10.5)
nRBC: 0 % (ref 0.0–0.2)

## 2018-06-19 LAB — RENAL FUNCTION PANEL
ALBUMIN: 2.6 g/dL — AB (ref 3.5–5.0)
Anion gap: 9 (ref 5–15)
BUN: 59 mg/dL — ABNORMAL HIGH (ref 6–20)
CO2: 23 mmol/L (ref 22–32)
CREATININE: 5.73 mg/dL — AB (ref 0.61–1.24)
Calcium: 8.1 mg/dL — ABNORMAL LOW (ref 8.9–10.3)
Chloride: 108 mmol/L (ref 98–111)
GFR calc Af Amer: 13 mL/min — ABNORMAL LOW (ref >60)
GFR calc non Af Amer: 11 mL/min — ABNORMAL LOW (ref >60)
Glucose, Bld: 123 mg/dL — ABNORMAL HIGH (ref 70–99)
Phosphorus: 4.3 mg/dL (ref 2.5–4.6)
Potassium: 4.3 mmol/L (ref 3.5–5.1)
SODIUM: 140 mmol/L (ref 135–145)

## 2018-06-19 LAB — BASIC METABOLIC PANEL
Anion gap: 9 (ref 5–15)
BUN: 59 mg/dL — ABNORMAL HIGH (ref 6–20)
CO2: 23 mmol/L (ref 22–32)
Calcium: 8.1 mg/dL — ABNORMAL LOW (ref 8.9–10.3)
Chloride: 109 mmol/L (ref 98–111)
Creatinine, Ser: 5.73 mg/dL — ABNORMAL HIGH (ref 0.61–1.24)
GFR calc Af Amer: 13 mL/min — ABNORMAL LOW (ref >60)
GFR calc non Af Amer: 11 mL/min — ABNORMAL LOW (ref >60)
Glucose, Bld: 124 mg/dL — ABNORMAL HIGH (ref 70–99)
POTASSIUM: 4.2 mmol/L (ref 3.5–5.1)
Sodium: 141 mmol/L (ref 135–145)

## 2018-06-19 LAB — MAGNESIUM: Magnesium: 1.9 mg/dL (ref 1.7–2.4)

## 2018-06-19 LAB — PARATHYROID HORMONE, INTACT (NO CA): PTH: 64 pg/mL (ref 15–65)

## 2018-06-19 MED ORDER — FERROUS SULFATE 325 (65 FE) MG PO TABS: 325.0000 mg | ORAL_TABLET | Freq: Every day | ORAL | 1 refills | Status: DC

## 2018-06-19 MED ORDER — ALBUTEROL SULFATE HFA 108 (90 BASE) MCG/ACT IN AERS: 2.0000 | INHALATION_SPRAY | RESPIRATORY_TRACT | 1 refills | Status: DC | PRN

## 2018-06-19 MED ORDER — ATORVASTATIN CALCIUM 20 MG PO TABS: 20.0000 mg | ORAL_TABLET | Freq: Every day | ORAL | 0 refills | Status: DC

## 2018-06-19 MED ORDER — SENNOSIDES-DOCUSATE SODIUM 8.6-50 MG PO TABS: 2.0000 | ORAL_TABLET | Freq: Every day | ORAL | 1 refills | Status: AC | PRN

## 2018-06-19 MED ORDER — FUROSEMIDE 80 MG PO TABS: 80.0000 mg | ORAL_TABLET | Freq: Two times a day (BID) | ORAL | 0 refills | Status: DC

## 2018-06-19 MED ORDER — VALACYCLOVIR HCL 500 MG PO TABS: 500.0000 mg | ORAL_TABLET | Freq: Every day | ORAL | 0 refills | Status: AC

## 2018-06-19 MED ORDER — CARVEDILOL 25 MG PO TABS: 25.0000 mg | ORAL_TABLET | Freq: Two times a day (BID) | ORAL | 0 refills | Status: DC

## 2018-06-19 MED ORDER — SODIUM CHLORIDE 0.9 % IV SOLN: 510.0000 mg | INTRAVENOUS | Status: DC

## 2018-06-19 MED ORDER — FOLIC ACID 1 MG PO TABS: 1.0000 mg | ORAL_TABLET | Freq: Every day | ORAL | 0 refills | Status: AC

## 2018-06-19 MED ORDER — TRAZODONE HCL 50 MG PO TABS: 50.0000 mg | ORAL_TABLET | Freq: Every evening | ORAL | 0 refills | Status: DC | PRN

## 2018-06-19 MED ORDER — AMLODIPINE BESYLATE 10 MG PO TABS: 10.0000 mg | ORAL_TABLET | Freq: Every day | ORAL | 1 refills | Status: DC

## 2018-06-19 MED ORDER — ISOSORBIDE MONONITRATE ER 60 MG PO TB24: 60.0000 mg | ORAL_TABLET | Freq: Every day | ORAL | 0 refills | Status: DC

## 2018-06-19 MED ORDER — HYDRALAZINE HCL 25 MG PO TABS: 25.0000 mg | ORAL_TABLET | Freq: Three times a day (TID) | ORAL | 0 refills | Status: DC

## 2018-06-19 NOTE — Discharge Summary (Signed)
Physician Discharge Summary  Corey Park WJX:[id number] DOB: 01-12-1971 DOA: 06/16/2018  PCP: Patient, No Pcp Per  Admit date: 06/16/2018 Discharge date: 06/19/2018  Admitted From:Home  Disposition: Home  Recommendations for Outpatient Follow-up:  1. Follow up with PCP in 1-2 weeks 2. Please obtain BMP/CBC in one week your next doctors visit.  3. Follow-up with outpatient cardiology, nephrology and vascular surgery.  Referrals have been given.  Cardiology services to make their follow-up appointment 4. Patient started on iron supplements, folic acid, Lasix 80 mg twice daily, hydralazine, isosorbide mononitrate. 5. Senokot as as needed, trazodone 50 mg at bedtime as needed for sleep, 10 tablets given 6. Valtrex 500 mg daily for 6 days, renally dosed  Discharge Condition: Stable CODE STATUS: Full code Diet recommendation: Renal  Brief/Interim Summary: 47 year old with history of diastolic CHF, CKD stage III-4, diabetes mellitus type 2, essential hypertension, medical noncompliance came to the hospital with complaints of shortness of breath and bilateral lower extremity edema..  Patient admits of being noncompliant with his medications for the past several months and has not followed up with cardiology or nephrology since his previous hospitalization.  In the ER he was noted to have some new T wave inversion in the inferior lateral leads.  Chest x-ray was suggestive of fluid overload.  He was started on Lasix.  Cardiology service was consulted who slowly added necessary medications including Coreg, hydralazine and isosorbide mononitrate.  His Lasix was switched to 80 mg orally twice daily.  He was noted to be iron deficient therefore supplements were started along with bowel regimen.  Nephrology team was consulted who recommended outpatient vascular follow-up. During the hospitalization he was also diagnosed with what appeared to be vesicular rash on his right neck behind the ear area in 1  dermatome in different stages concerning for shingles.  He was started on Valtrex which was renally dosed.  Today patient is doing much better and wishes to go home. Will discharge today in stable condition  Discharge Diagnoses:  Principal Problem:   Acute diastolic (congestive) heart failure (HCC) Active Problems:   DM (diabetes mellitus), type 2 (HCC)   HLD (hyperlipidemia)   Essential hypertension   Chronic kidney disease   Acute on chronic congestive heart failure (HCC)   Elevated troponin   Dyspnea on exertion secondary to pulmonary edema Acute on chronic diastolic congestive heart failure, ejection fraction 55 to 60%, grade 3 Abnormal EKG with new T wave inversion -His volume status is greatly improved during hospitalization.  Lasix has been transitioned to 80 mg twice daily.  He has been educated on how to manage his symptoms at home.  He needs to check his weight daily -Continue taking oral Coreg, hydralazine and Imdur.  He has been educated on importance to take these medications.  Spouse is at bedside and understands this as well.  -Echocardiogram 11/2016-ejection fraction 55 to 60% without wall motion abnormalities  CKD stage V -Creatinine of 5.5 appears to be around his new baseline.  He needs to follow-up outpatient with vascular, referral has been given.  Appreciate nephrology input on this, he will need outpatient follow-up with [facility]  Anemia of chronic disease Iron deficiency anemia -This is a combination of iron deficiency and chronic disease.  We will start him on iron supplements and bowel regimen.  EPO can be given by nephrology.  Concerns for right lower lobe pneumonia; low suspicion. -Procalcitonin specimen is negative, will stop the antibiotics. -Incentive spirometry and flutter valve  Essential hypertension -Continue  Norvasc 10 mg daily, Coreg 25 mg  Shingles behind his right ear - Vesicular rash in different stages.  Start him on Valtrex  for total of 7 days, 6 more days remaining.  This has been renally dosed.  Patient on Lovenox during the hospitalization Full code Wife at bedside Discharge today  Discharge Instructions   Allergies as of 06/19/2018   No Known Allergies     Medication List    STOP taking these medications   albuterol 108 (90 Base) MCG/ACT inhaler Commonly known as:  PROVENTIL HFA;VENTOLIN HFA   blood glucose meter kit and supplies Kit   chlorthalidone 25 MG tablet Commonly known as:  HYGROTON   metFORMIN 500 MG tablet Commonly known as:  GLUCOPHAGE     TAKE these medications   amLODipine 10 MG tablet Commonly known as:  NORVASC Take 1 tablet (10 mg total) by mouth daily.   atorvastatin 20 MG tablet Commonly known as:  LIPITOR Take 1 tablet (20 mg total) by mouth daily at 6 PM.   carvedilol 25 MG tablet Commonly known as:  COREG Take 1 tablet (25 mg total) by mouth 2 (two) times daily with a meal.   ferrous sulfate 325 (65 FE) MG tablet Take 1 tablet (325 mg total) by mouth daily.   folic acid 1 MG tablet Commonly known as:  FOLVITE Take 1 tablet (1 mg total) by mouth daily.   furosemide 80 MG tablet Commonly known as:  LASIX Take 1 tablet (80 mg total) by mouth 2 (two) times daily.   hydrALAZINE 25 MG tablet Commonly known as:  APRESOLINE Take 1 tablet (25 mg total) by mouth 3 (three) times daily.   isosorbide mononitrate 60 MG 24 hr tablet Commonly known as:  IMDUR Take 1 tablet (60 mg total) by mouth daily.   senna-docusate 8.6-50 MG tablet Commonly known as:  Senokot-S Take 2 tablets by mouth daily as needed for mild constipation.   traZODone 50 MG tablet Commonly known as:  DESYREL Take 1 tablet (50 mg total) by mouth at bedtime as needed for up to 10 days for sleep.   valACYclovir 500 MG tablet Commonly known as:  VALTREX Take 1 tablet (500 mg total) by mouth daily for 6 days.      Follow-up Information    [facility].  Schedule an appointment as soon as possible for a visit in 1 week(s).   Contact information: [place] [address] [place], [facility]. Schedule an appointment as soon as possible for a visit in 1 week(s).   Specialty:  Nephrology Contact information: [address] [place] [address] [phone]        [facility]. Schedule an appointment as soon as possible for a visit in 2 week(s).   Why:  Needs eval for Long Term HD placement Contact information: [place]       Jerline Pain, MD Follow up.   Specialty:  Cardiology Why:  Appoitment to be made by Cardiology team.  Contact information: [address] [address] [phone]          No Known Allergies  You were cared for by a hospitalist during your hospital stay. If you have any questions about your discharge medications or the care you received while you were in the hospital after you are  discharged, you can call the unit and asked to speak with the hospitalist on call if the hospitalist that took care of you is not available. Once you are discharged, your primary care physician will handle any further medical issues. Please note that no refills for any discharge medications will be authorized once you are discharged, as it is imperative that you return to your primary care physician (or establish a relationship with a primary care physician if you do not have one) for your aftercare needs so that they can reassess your need for medications and monitor your lab values.  Consultations:  Cardiology  Nephrology   Procedures/Studies: Dg Chest 2 View  Result Date: 06/16/2018 CLINICAL DATA:  Lower extremity edema, cough, wheezing, symptoms for 1 day, history CHF, hypertension, asthma, diabetes mellitus, renal disorder EXAM: CHEST -  2 VIEW COMPARISON:  12/11/2016 FINDINGS: Enlargement of cardiac silhouette with pulmonary vascular congestion. Mediastinal contours normal. Slight accentuation of perihilar markings with consolidation in RIGHT lower lobe most consistent with pneumonia. Remaining lungs clear. No pleural effusion or pneumothorax. Bones unremarkable. IMPRESSION: Enlargement of cardiac silhouette with pulmonary vascular congestion. Slight chronic accentuation of pulmonary markings with new RIGHT lower lobe infiltrate consistent with pneumonia. Electronically Signed   By: Lavonia [name] M.D.   On: 06/16/2018 16:30   US Renal  Result Date: 06/18/2018 CLINICAL DATA:  Initial evaluation for acute kidney injury, history of diabetes, hypertension. EXAM: RENAL / URINARY TRACT ULTRASOUND COMPLETE COMPARISON:  Prior ultrasound from 12/12/2016. FINDINGS: Right Kidney: Renal measurements: 10.6 x 4.8 x 4.6 cm = volume: 120.1 mL. Diffusely increased echogenicity within the renal parenchyma, compatible with medical renal disease. 1.6 x 1.1 x 1.3 cm cortically based simple cyst present at the lower pole. Additional tiny 7 x 5 x 7 mm simple cyst present at the upper pole. No hydronephrosis. Left Kidney: Renal measurements: 11.7 x 6.3 x 5.3 cm = volume: 203.1 mL. Diffusely increased echogenicity within the renal parenchyma, compatible with medical renal disease. No mass lesion. No hydronephrosis. Bladder: Bladder largely decompressed. Mild circumferential bladder wall thickening favored to be related incomplete distension. IMPRESSION: 1. Increased echogenicity within the renal parenchyma bilaterally, compatible with medical renal disease. No hydronephrosis. 2. Few small right renal cysts measuring up to 1.6 cm as above. 3. Mild circumferential wall thickening about the bladder, favored to be related incomplete distension and/or chronic outlet obstruction. Correlation with urinalysis suggested. Electronically Signed   By: Jeannine Boga M.D.    On: 06/18/2018 19:46      Subjective: Feels much better.  No complaints.  Feels back to himself.  General = no fevers, chills, dizziness, malaise, fatigue HEENT/EYES = negative for pain, redness, loss of vision, double vision, blurred vision, loss of hearing, sore throat, hoarseness, dysphagia Cardiovascular= negative for chest pain, palpitation, murmurs, lower extremity swelling Respiratory/lungs= negative for shortness of breath, cough, hemoptysis, wheezing, mucus production Gastrointestinal= negative for nausea, vomiting,, abdominal pain, melena, hematemesis Genitourinary= negative for Dysuria, Hematuria, Change in Urinary Frequency MSK = Negative for arthralgia, myalgias, Back Pain, Joint swelling  Neurology= Negative for headache, seizures, numbness, tingling  Psychiatry= Negative for anxiety, depression, suicidal and homocidal ideation Allergy/Immunology= Medication/Food allergy as listed  Skin= Negative for Rash, lesions, ulcers, itching    Discharge Exam: Vitals:   06/19/18 0511 06/19/18 0652  BP: (!) 175/104 (!) 144/93  Pulse: 90 85  Resp: 18   Temp: 99.6 F (37.6 C)   SpO2: 98%    Vitals:   06/18/18 1304 06/18/18 2127 06/19/18  0511 06/19/18 0652  BP: (!) 157/99 (!) 151/98 (!) 175/104 (!) 144/93  Pulse: 85 84 90 85  Resp: _0 Temp: 98.6 F (37 C) 99.7 F (37.6 C) 99.6 F (37.6 C)   TempSrc: Oral Oral Oral   SpO2: 100% 99% 98%   Weight:   95.9 kg   Height:        General: Pt is alert, awake, not in acute distress Cardiovascular: RRR, S1/S2 +, no rubs, no gallops Respiratory: CTA bilaterally, no wheezing, no rhonchi Abdominal: Soft, NT, ND, bowel sounds + Extremities: 1+ bilateral lower extremity pitting edema, no cyanosis Vesicular rash noted behind his right ear in different stages.   The results of significant diagnostics from this hospitalization (including imaging, microbiology, ancillary and laboratory) are listed below for reference.      Microbiology: No results found for this or any previous visit (from the past 240 hour(s)).   Labs: BNP (last 3 results) Recent Labs    06/16/18 1559 06/16/18 1606 06/18/18 0558  BNP 2,722.4* 2,805.4* 9,396.8*   Basic Metabolic Panel: Recent Labs  Lab 06/17/18 0444 06/17/18 1144 06/18/18 0558 06/18/18 1225 06/19/18 0406  NA 138 137 140 139 140  141  K 4.5 4.4 4.5 4.1 4.3  4.2  CL 107 110 107 106 108  109  CO2 _1 GLUCOSE 109* 159* 104* 189* 123*  124*  BUN 53* 53* 59* 61* 59*  59*  CREATININE 5.10* 5.31* 5.54* 5.71* 5.73*  5.73*  CALCIUM 8.2* 7.9* 8.2* 8.0* 8.1*  8.1*  MG 1.8  --  1.8  --  1.9  PHOS  --   --   --   --  4.3   Liver Function Tests: Recent Labs  Lab 06/19/18 0406  ALBUMIN 2.6*   No results for input(s): LIPASE, AMYLASE in the last 168 hours. No results for input(s): AMMONIA in the last 168 hours. CBC: Recent Labs  Lab 06/16/18 1541 06/16/18 1904 06/17/18 0444 06/18/18 0558 06/19/18 0406  WBC 9.3 8.6 6.8 6.9 5.6  NEUTROABS 7.8*  --   --   --   --   HGB 9.5* 8.8* 8.4* 8.7* 8.8*  HCT 30.6* 28.1* 27.3* 27.3* 28.1*  MCV 94.2 94.9 94.8 94.8 94.0  PLT 186 173 161 189 201   Cardiac Enzymes: Recent Labs  Lab 06/17/18 0444 06/17/18 1144  TROPONINI 0.19* 0.18*   BNP: Invalid input(s): POCBNP CBG: No results for input(s): GLUCAP in the last 168 hours. D-Dimer No results for input(s): DDIMER in the last 72 hours. Hgb A1c Recent Labs    06/17/18 0444  HGBA1C 5.1   Lipid Profile Recent Labs    06/17/18 0444  CHOL 127  HDL 29*  LDLCALC 86  TRIG 59  CHOLHDL 4.4   Thyroid function studies No results for input(s): TSH, T4TOTAL, T3FREE, THYROIDAB in the last 72 hours.  Invalid input(s): FREET3 Anemia work up Recent Labs    06/18/18 1225  VITAMINB12 289  FERRITIN 52  TIBC 273  IRON 20*   Urinalysis    Component Value Date/Time   COLORURINE STRAW (A) 12/11/2016 1901   APPEARANCEUR CLEAR 12/11/2016  1901   LABSPEC 1.008 12/11/2016 1901   PHURINE 6.0 12/11/2016 1901   GLUCOSEU NEGATIVE 12/11/2016 1901   HGBUR MODERATE (A) 12/11/2016 1901   BILIRUBINUR NEGATIVE 12/11/2016 1901   KETONESUR NEGATIVE 12/11/2016 1901   PROTEINUR >300 (A) 12/11/2016 1901   NITRITE NEGATIVE 12/11/2016 1901  LEUKOCYTESUR NEGATIVE 12/11/2016 1901   Sepsis Labs Invalid input(s): PROCALCITONIN,  WBC,  LACTICIDVEN Microbiology No results found for this or any previous visit (from the past 240 hour(s)).   Time coordinating discharge:  I have spent 35 minutes face to face with the patient and on the ward discussing the patients care, assessment, plan and disposition with other care givers. >50% of the time was devoted counseling the patient about the risks and benefits of treatment/Discharge disposition and coordinating care.   SIGNED:   Damita Lack, MD  [facility] 06/19/2018, 8:06 AM Pager   If 7PM-7AM, please contact night-coverage [email or web address] Password [email or web address]

## 2018-06-19 NOTE — Progress Notes (Signed)
Assumed care of patient at 2145. Agreed with nurse assessment of patient and will continue to monitor. Patient alert and oriented and in no acute distress.

## 2018-06-19 NOTE — Progress Notes (Signed)
Patient discharged home.  IV removed - WNL.  Reviewed AVS and medications.  Educated on HF management at home concerning daily weights, symptoms to watch for/when to call MD, and diet.  Handouts given  - verbalizes undersding with teach back.  No questions at this time.  Also instructed to make follow up appointments tomorrow morning.  Patient in NAD.

## 2018-06-19 NOTE — Progress Notes (Signed)
Patient ID: Corey Park, male   DOB: December 04, 1970, 47 y.o.   MRN: [id number] S: Feels better O:BP (!) 144/93   Pulse 85   Temp 99.6 F (37.6 C) (Oral)   Resp 18   Ht 6\' 1"  (1.854 m)   Wt 95.9 kg   SpO2 98%   BMI 27.89 kg/m   Intake/Output Summary (Last 24 hours) at 06/19/2018 1320 Last data filed at 06/19/2018 0729 Gross per 24 hour  Intake 800 ml  Output 1225 ml  Net -425 ml   Intake/Output: I/O last 3 completed shifts: In: 1610 [P.O.:1610] Out: 3725 [[id number]]  Intake/Output this shift:  Total I/O In: 200 [P.O.:200] Out: 300 [Urine:300] Weight change: -8.437 kg Gen: NAD CVS: no rub Resp: cta Abd: +BS, soft, NT/ND Ext: minimal pretibial edema  Recent Labs  Lab 06/16/18 1541 06/16/18 1904 06/17/18 0444 06/17/18 1144 06/18/18 0558 06/18/18 1225 06/19/18 0406  NA 139  --  138 137 140 139 140  141  K 4.7  --  4.5 4.4 4.5 4.1 4.3  4.2  CL 110  --  107 110 107 106 108  109  CO2 20*  --  22 22 23 24 23  23   GLUCOSE 146*  --  109* 159* 104* 189* 123*  124*  BUN 48*  --  53* 53* 59* 61* 59*  59*  CREATININE 4.88* 4.87* 5.10* 5.31* 5.54* 5.71* 5.73*  5.73*  ALBUMIN  --   --   --   --   --   --  2.6*  CALCIUM 8.2*  --  8.2* 7.9* 8.2* 8.0* 8.1*  8.1*  PHOS  --   --   --   --   --   --  4.3   Liver Function Tests: Recent Labs  Lab 06/19/18 0406  ALBUMIN 2.6*   No results for input(s): LIPASE, AMYLASE in the last 168 hours. No results for input(s): AMMONIA in the last 168 hours. CBC: Recent Labs  Lab 06/16/18 1541 06/16/18 1904 06/17/18 0444 06/18/18 0558 06/19/18 0406  WBC 9.3 8.6 6.8 6.9 5.6  NEUTROABS 7.8*  --   --   --   --   HGB 9.5* 8.8* 8.4* 8.7* 8.8*  HCT 30.6* 28.1* 27.3* 27.3* 28.1*  MCV 94.2 94.9 94.8 94.8 94.0  PLT 186 173 161 189 201   Cardiac Enzymes: Recent Labs  Lab 06/17/18 0444 06/17/18 1144  TROPONINI 0.19* 0.18*   CBG: No results for input(s): GLUCAP in the last 168 hours.  Iron Studies:  Recent Labs   06/18/18 1225  IRON 20*  TIBC 273  FERRITIN 52   Studies/Results: US Renal  Result Date: 06/18/2018 CLINICAL DATA:  Initial evaluation for acute kidney injury, history of diabetes, hypertension. EXAM: RENAL / URINARY TRACT ULTRASOUND COMPLETE COMPARISON:  Prior ultrasound from 12/12/2016. FINDINGS: Right Kidney: Renal measurements: 10.6 x 4.8 x 4.6 cm = volume: 120.1 mL. Diffusely increased echogenicity within the renal parenchyma, compatible with medical renal disease. 1.6 x 1.1 x 1.3 cm cortically based simple cyst present at the lower pole. Additional tiny 7 x 5 x 7 mm simple cyst present at the upper pole. No hydronephrosis. Left Kidney: Renal measurements: 11.7 x 6.3 x 5.3 cm = volume: 203.1 mL. Diffusely increased echogenicity within the renal parenchyma, compatible with medical renal disease. No mass lesion. No hydronephrosis. Bladder: Bladder largely decompressed. Mild circumferential bladder wall thickening favored to be related incomplete distension. IMPRESSION: 1. Increased echogenicity within the renal parenchyma bilaterally, compatible with  medical renal disease. No hydronephrosis. 2. Few small right renal cysts measuring up to 1.6 cm as above. 3. Mild circumferential wall thickening about the bladder, favored to be related incomplete distension and/or chronic outlet obstruction. Correlation with urinalysis suggested. Electronically Signed   By: Jeannine Boga M.D.   On: 06/18/2018 19:46   . amLODipine  10 mg Oral Daily  . atorvastatin  20 mg Oral q1800  . carvedilol  25 mg Oral BID WC  . enoxaparin (LOVENOX) injection  30 mg Subcutaneous Q24H  . folic acid  1 mg Oral Daily  . furosemide  80 mg Oral BID  . hydrALAZINE  25 mg Oral TID  . hydrocerin   Topical BID  . isosorbide mononitrate  60 mg Oral Daily  . valACYclovir  500 mg Oral Daily    BMET    Component Value Date/Time   NA 141 06/19/2018 0406   NA 140 06/19/2018 0406   K 4.2 06/19/2018 0406   K 4.3 06/19/2018  0406   CL 109 06/19/2018 0406   CL 108 06/19/2018 0406   CO2 23 06/19/2018 0406   CO2 23 06/19/2018 0406   GLUCOSE 124 (H) 06/19/2018 0406   GLUCOSE 123 (H) 06/19/2018 0406   BUN 59 (H) 06/19/2018 0406   BUN 59 (H) 06/19/2018 0406   CREATININE 5.73 (H) 06/19/2018 0406   CREATININE 5.73 (H) 06/19/2018 0406   CALCIUM 8.1 (L) 06/19/2018 0406   CALCIUM 8.1 (L) 06/19/2018 0406   GFRNONAA 11 (L) 06/19/2018 0406   GFRNONAA 11 (L) 06/19/2018 0406   GFRAA 13 (L) 06/19/2018 0406   GFRAA 13 (L) 06/19/2018 0406   CBC    Component Value Date/Time   WBC 5.6 06/19/2018 0406   RBC 2.99 (L) 06/19/2018 0406   HGB 8.8 (L) 06/19/2018 0406   HCT 28.1 (L) 06/19/2018 0406   PLT 201 06/19/2018 0406   MCV 94.0 06/19/2018 0406   MCH 29.4 06/19/2018 0406   MCHC 31.3 06/19/2018 0406   RDW 13.8 06/19/2018 0406   LYMPHSABS 0.8 06/16/2018 1541   MONOABS 0.6 06/16/2018 1541   EOSABS 0.1 06/16/2018 1541   BASOSABS 0.0 06/16/2018 1541    Assessment/Plan: 1. 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  Not sure how much improvement he will have but will need to educate about RRT.  Stressed importance of BP control (goal <130/80), Diabetes control (goal Hgb A1c <7%), and avoidance of nephrotoxic agents such as NSAIDs/Cox-II I's.  Will have him watch educational videos about dialysis.  He will need [facility]VS referral and placement of an access as an outpatient if he will agree.  Currently without uremic symptoms. 1. Will need to f/u with me as an outpatient for ongoing education and preparation for RRT 2. Cr stable and ok for discharge but urged compliance with medications and follow up. 2. Anemia of CKD stage 4.  Will replete with IV iron and arrange for outpatient ESA therapy after follow up. 3. DM- poorly controlled per primary 4. HTN with severe LV dysfunction.  Agree with addition of Bidil and Cardiology following.  Avoid ACE/ARB for now.  5. CHF- new drop in EF to 25-30% since  last ECHO in 2018 (55-60%).  Cardiology consulted and on IV lasix.  Likely due to poorly controlled HTN 6. Medical noncompliance- stressed the importance of compliance with medications and medical follow up. 7. Secondary HPTH- will check phos, Vit D, and iPTH levels.  8. Disposition- for discharge per  primary svc  Donetta Potts, MD [employer] [phone]

## 2018-06-19 NOTE — Discharge Instructions (Signed)

## 2018-06-20 ENCOUNTER — Telehealth: Payer: Self-pay | Admitting: Physician Assistant

## 2018-06-20 LAB — FOLATE RBC
Folate, Hemolysate: 391.5 ng/mL
Folate, RBC: 1483 ng/mL (ref >498)
HEMATOCRIT: 26.4 % — AB (ref 37.5–51.0)

## 2018-06-20 LAB — VITAMIN D 25 HYDROXY (VIT D DEFICIENCY, FRACTURES): Vit D, 25-Hydroxy: 5.8 ng/mL — ABNORMAL LOW (ref 30.0–100.0)

## 2018-06-20 NOTE — Telephone Encounter (Signed)
New message   First available [facility] Appointment with Richardson Dopp on 07/03/2018 at 11:15am.

## 2018-06-20 NOTE — Telephone Encounter (Signed)
Patient contacted regarding discharge from [facility] on 06/19/18.  Patient understands to follow up with provider Richardson Dopp, PA on 07/03/18 at 11:15 at [facility]. Patient understands discharge instructions? Yes Patient understands medications and regiment? Yes Patient understands to bring all medications to this visit? Yes  Patient denies questions or concerns. I gave him the office address and advised him to call back with questions or concerns prior to appointment on 1/8. He thanked me for the call.

## 2018-07-02 ENCOUNTER — Encounter: Payer: Self-pay | Admitting: Physician Assistant

## 2018-07-03 ENCOUNTER — Encounter: Payer: Self-pay | Admitting: Physician Assistant

## 2018-07-03 ENCOUNTER — Ambulatory Visit (INDEPENDENT_AMBULATORY_CARE_PROVIDER_SITE_OTHER): Payer: Self-pay | Admitting: Physician Assistant

## 2018-07-03 VITALS — BP 140/80 | HR 72 | Ht 73.0 in | Wt 237.0 lb

## 2018-07-03 DIAGNOSIS — I420 Dilated cardiomyopathy: Secondary | ICD-10-CM | POA: Insufficient documentation

## 2018-07-03 DIAGNOSIS — I5022 Chronic systolic (congestive) heart failure: Secondary | ICD-10-CM | POA: Insufficient documentation

## 2018-07-03 DIAGNOSIS — R809 Proteinuria, unspecified: Secondary | ICD-10-CM

## 2018-07-03 DIAGNOSIS — N184 Chronic kidney disease, stage 4 (severe): Secondary | ICD-10-CM

## 2018-07-03 DIAGNOSIS — I10 Essential (primary) hypertension: Secondary | ICD-10-CM

## 2018-07-03 DIAGNOSIS — E1129 Type 2 diabetes mellitus with other diabetic kidney complication: Secondary | ICD-10-CM

## 2018-07-03 HISTORY — DX: Dilated cardiomyopathy: I42.0

## 2018-07-03 MED ORDER — ISOSORBIDE MONONITRATE ER 30 MG PO TB24: 30.0000 mg | ORAL_TABLET | Freq: Every day | ORAL | 3 refills | Status: DC

## 2018-07-03 MED ORDER — HYDRALAZINE HCL 50 MG PO TABS: 50.0000 mg | ORAL_TABLET | Freq: Three times a day (TID) | ORAL | 3 refills | Status: DC

## 2018-07-03 NOTE — Progress Notes (Signed)
Cardiology Office Note:    Date:  07/03/2018   ID:  Corey Park, DOB July 18, 1970, MRN [id number]  PCP:  Patient, No Pcp Per  Cardiologist:  Candee Furbish, MD  Electrophysiologist:  None   Referring MD: No ref. provider found   Chief Complaint  Patient presents with  . Hospitalization Follow-up    CHF    History of Present Illness:    Corey Park is a 48 y.o. male with heart failure with preserved ejection fraction, chronic kidney disease stage IV-V, hypertension, diabetes, anemia of chronic disease.  The patient has had several admissions for decompensated heart failure.  He was seen by cardiology in June 2018 (Dr. Marlou Porch) during the hospitalization.  He has never had follow-up.  He was recently admitted 12/22-12/25 with decompensated heart failure complicated by possible pneumonia.  Echocardiogram demonstrated reduced LV function with an EF of 25-30%.  He was seen by cardiology.  Medications were adjusted and he was diuresed.  He was also seen by nephrology who arranged for outpatient follow-up and vascular access.   [name] returns for post hospital follow up.  He is here alone.  He has been doing well since DC.  He has an appt with the [facility] 1/13.  He tells me he also has an appt with Dr. Marval Regal at [facility] that day.  He denies chest pain, shortness of breath, syncope, paroxysmal nocturnal dyspnea.  His leg swelling is improved.  He denies cough. He does not have a scale at home.   Prior CV studies:   The following studies were reviewed today:  Echocardiogram 06/17/18 Mod LVH, EF 25-30, diff HK, mild MR, mod LAE, PASP 36  Echocardiogram 12/12/16 EF 55-60, normal wall motion, trivial MR, severe LAE, normal RVSF  Past Medical History:  Diagnosis Date  . Asthma   . Diabetes mellitus without complication ([place])   . Hypertension   . Renal disorder    Surgical Hx: The patient  has no past surgical history on file.   Current Medications: Current Meds    Medication Sig  . amLODipine (NORVASC) 10 MG tablet Take 1 tablet (10 mg total) by mouth daily.  [name] atorvastatin (LIPITOR) 20 MG tablet Take 1 tablet (20 mg total) by mouth daily at 6 PM.  . carvedilol (COREG) 25 MG tablet Take 1 tablet (25 mg total) by mouth 2 (two) times daily with a meal.  . ferrous sulfate 325 (65 FE) MG tablet Take 1 tablet (325 mg total) by mouth daily.  . folic acid (FOLVITE) 1 MG tablet Take 1 tablet (1 mg total) by mouth daily.  . furosemide (LASIX) 80 MG tablet Take 1 tablet (80 mg total) by mouth 2 (two) times daily.  [name] senna-docusate (SENOKOT-S) 8.6-50 MG tablet Take 2 tablets by mouth daily as needed for mild constipation.  . traZODone (DESYREL) 50 MG tablet Take 1 tablet (50 mg total) by mouth at bedtime as needed for up to 10 days for sleep.  . [DISCONTINUED] hydrALAZINE (APRESOLINE) 25 MG tablet Take 1 tablet (25 mg total) by mouth 3 (three) times daily.  . [DISCONTINUED] isosorbide mononitrate (IMDUR) 60 MG 24 hr tablet Take 1 tablet (60 mg total) by mouth daily.     Allergies:   Patient has no known allergies.   Social History   Tobacco Use  . Smoking status: Never Smoker  . Smokeless tobacco: Never Used  Substance Use Topics  . Alcohol use: Never    Frequency: Never    Comment: denies  .  Drug use: Never    Types: Marijuana    Comment: denies     Family Hx: The patient's family history includes CAD in his mother; Hypertension in his mother. There is no history of Diabetes, Stroke, Cancer, or Kidney failure.  ROS:   Please see the history of present illness.    Review of Systems  Neurological: Positive for headaches.   All other systems reviewed and are negative.   EKGs/Labs/Other Test Reviewed:    EKG:  EKG is  ordered today.  The ekg ordered today demonstrates NSR, HR 72, normal axis, TW inversions 1, 2, 3, aVF, V3-6, QTc 446, no change from prior tracing   Recent Labs: 06/18/2018: B Natriuretic Peptide 1,763.6 06/19/2018: BUN 59; BUN  59; Creatinine, Ser 5.73; Creatinine, Ser 5.73; Hemoglobin 8.8; Magnesium 1.9; Platelets 201; Potassium 4.2; Potassium 4.3; Sodium 141; Sodium 140   Recent Lipid Panel Lab Results  Component Value Date/Time   CHOL 127 06/17/2018 04:44 AM   TRIG 59 06/17/2018 04:44 AM   HDL 29 (L) 06/17/2018 04:44 AM   CHOLHDL 4.4 06/17/2018 04:44 AM   LDLCALC 86 06/17/2018 04:44 AM    Physical Exam:    VS:  BP 140/80   Pulse 72   Ht 6\' 1"  (1.854 m)   Wt 237 lb (107.5 kg)   SpO2 99%   BMI 31.27 kg/m     Wt Readings from Last 3 Encounters:  07/03/18 237 lb (107.5 kg)  06/19/18 211 lb 6.4 oz (95.9 kg)  01/27/18 225 lb (102.1 kg)     Physical Exam  Constitutional: He is oriented to person, place, and time. He appears well-developed and well-nourished. No distress.  HENT:  Head: Normocephalic and atraumatic.  Eyes: No scleral icterus.  Neck: Neck supple. No JVD present. No thyromegaly present.  Cardiovascular: Normal rate, regular rhythm, S1 normal and S2 normal.  No murmur heard. Pulmonary/Chest: Breath sounds normal. He has no rales.  Abdominal: Soft. There is no hepatomegaly.  Musculoskeletal:        General: Edema (1+ bilat LE edema) present.  Lymphadenopathy:    He has no cervical adenopathy.  Neurological: He is alert and oriented to person, place, and time.  Skin: Skin is warm and dry.  Psychiatric: He has a normal mood and affect.    ASSESSMENT & PLAN:    Chronic systolic heart failure (HCC)  EF 25-30.  NYHA 2.  He is not on ACE/ARB or spironolactone due to CKD.  Continue current dose of Carvedilol.  Decrease Imdur to 30 mg QD due to complaints of HAs.  I will increase his Hydralazine to 50 mg TID.  We will likely need to increase this further.  Continue current dose of Lasix.  I have encouraged him to weigh daily.    Dilated cardiomyopathy ([place]) Likely related to uncontrolled HTN.  He had diff HK on Echo.  No focal wall motion abnl.  He is unable to undergo Cardiac  Catheterization at this time as he is not yet on dialysis.  We will continue to titrate medications for CHF and plan on a repeat echo at some point.  If his EF does not recover, he will likely need Cardiac Catheterization once he is on dialysis.  If his EF returns to normal with optimal therapy, we will likely be able to avoid Cardiac Catheterization.    Stage 4 chronic kidney disease ([place]) Follow up as planned with Nephrology.   Type 2 diabetes mellitus with microalbuminuria, without long-term current  use of insulin ([place]) Follow up with PCP.  He does not appear to be on any medications at this time for diabetes.    Essential hypertension  BP above target.  Adjust medications as outlined.     Dispo:  Return in about 6 weeks (around 08/14/2018) for Routine Follow Up w/ Dr. Marlou Porch, or Richardson Dopp, PA-C.   Medication Adjustments/Labs and Tests Ordered: Current medicines are reviewed at length with the patient today.  Concerns regarding medicines are outlined above.  Tests Ordered: Orders Placed This Encounter  Procedures  . EKG 12-Lead   Medication Changes: Meds ordered this encounter  Medications  . isosorbide mononitrate (IMDUR) 30 MG 24 hr tablet    Sig: Take 1 tablet (30 mg total) by mouth daily.    Dispense:  90 tablet    Refill:  3  . hydrALAZINE (APRESOLINE) 50 MG tablet    Sig: Take 1 tablet (50 mg total) by mouth 3 (three) times daily.    Dispense:  270 tablet    Refill:  3    Signed, Richardson Dopp, PA-C  07/03/2018 4:34 PM    [facility] [address], [place], [place]  [address] Phone: [phone]; Fax: [phone]

## 2018-07-03 NOTE — Patient Instructions (Signed)
Medication Instructions:  Your physician has recommended you make the following change in your medication:  1. DECREASE IMDUR TO 30 MG DAILY.  2. INCREASE HYDRALAZINE 50 MG THREE TIMES A DAY.   If you need a refill on your cardiac medications before your next appointment, please call your pharmacy.   Lab work: NONE If you have labs (blood work) drawn today and your tests are completely normal, you will receive your results only by: [name] MyChart Message (if you have MyChart) OR . A paper copy in the mail If you have any lab test that is abnormal or we need to change your treatment, we will call you to review the results.  Testing/Procedures: NONE  Follow-Up: At [facility], you and your health needs are our priority.  As part of our continuing mission to provide you with exceptional heart care, we have created designated Provider Care Teams.  These Care Teams include your primary Cardiologist (physician) and Advanced Practice Providers (APPs -  Physician Assistants and Nurse Practitioners) who all work together to provide you with the care you need, when you need it. You will need a follow up appointment in 4-6 weeks.  Please call our office 2 months in advance to schedule this appointment.  You may see Candee Furbish, MD or Richardson Dopp, PA-C on a day when Dr. Marlou Porch is in the office.  Any Other Special Instructions Will Be Listed Below (If Applicable).

## 2018-07-05 ENCOUNTER — Other Ambulatory Visit: Payer: Self-pay

## 2018-07-05 ENCOUNTER — Telehealth: Payer: Self-pay | Admitting: Physician Assistant

## 2018-07-05 MED ORDER — FUROSEMIDE 80 MG PO TABS: 80.0000 mg | ORAL_TABLET | Freq: Two times a day (BID) | ORAL | 3 refills | Status: DC

## 2018-07-05 NOTE — Telephone Encounter (Signed)
Per Richardson Dopp, PA-C okay to refill furosemide 90 days with 3 refills.

## 2018-07-05 NOTE — Telephone Encounter (Signed)
Pt's wife calling requesting a refill on furosemide. This medication was prescribed in the hospital. Would Richardson Dopp, PA like to refill this medication? Please address

## 2018-07-05 NOTE — Telephone Encounter (Signed)
Yes.  Please send refill to his pharmacy - 90 days, 3 refills. Richardson Dopp, PA-C    07/05/2018 1:47 PM

## 2018-07-07 NOTE — Progress Notes (Deleted)
Subjective:    Patient ID: Corey Park, male    DOB: Jul 13, 1970, 48 y.o.   MRN: [id number]  47 y.o.F recent adm for CHF , CKD  Admit date: 06/16/2018 Discharge date: 06/19/2018  Admitted From:Home  Disposition: Home  Recommendations for Outpatient Follow-up:  1. Follow up with PCP in 1-2 weeks 2. Please obtain BMP/CBC in one week your next doctors visit.  3. Follow-up with outpatient cardiology, nephrology and vascular surgery.  Referrals have been given.  Cardiology services to make their follow-up appointment 4. Patient started on iron supplements, folic acid, Lasix 80 mg twice daily, hydralazine, isosorbide mononitrate. 5. Senokot as as needed, trazodone 50 mg at bedtime as needed for sleep, 10 tablets given 6. Valtrex 500 mg daily for 6 days, renally dosed  Discharge Condition: Stable CODE STATUS: Full code Diet recommendation: Renal  Brief/Interim Summary: 48 year old with history of diastolic CHF, CKD stage III-4, diabetes mellitus type 2, essential hypertension, medical noncompliance came to the hospital with complaints of shortness of breath and bilateral lower extremity edema.. Patient admits of being noncompliant with his medications for the past several months and has not followed up with cardiology or nephrology since his previous hospitalization. In the ER he was noted to have some new T wave inversion in the inferior lateral leads. Chest x-ray was suggestive of fluid overload. He was started on Lasix.  Cardiology service was consulted who slowly added necessary medications including Coreg, hydralazine and isosorbide mononitrate.  His Lasix was switched to 80 mg orally twice daily.  He was noted to be iron deficient therefore supplements were started along with bowel regimen.  Nephrology team was consulted who recommended outpatient vascular follow-up. During the hospitalization he was also diagnosed with what appeared to be vesicular rash on his right neck behind  the ear area in 1 dermatome in different stages concerning for shingles.  He was started on Valtrex which was renally dosed.  Today patient is doing much better and wishes to go home. Will discharge today in stable condition  Discharge Diagnoses:  Principal Problem:   Acute diastolic (congestive) heart failure (HCC) Active Problems:   DM (diabetes mellitus), type 2 (HCC)   HLD (hyperlipidemia)   Essential hypertension   Chronic kidney disease   Acute on chronic congestive heart failure (HCC)   Elevated troponin   Dyspnea on exertion secondary to pulmonary edema Acute on chronic diastolic congestive heart failure, ejection fraction 55 to 60%, grade 3 Abnormal EKG with new T wave inversion -His volume status is greatly improved during hospitalization.  Lasix has been transitioned to 80 mg twice daily.  He has been educated on how to manage his symptoms at home.  He needs to check his weight daily -Continue taking oral Coreg, hydralazine and Imdur.  He has been educated on importance to take these medications.  Spouse is at bedside and understands this as well.  -Echocardiogram 11/2016-ejection fraction 55 to 60% without wall motion abnormalities  CKD stage V -Creatinine of 5.5 appears to be around his new baseline.  He needs to follow-up outpatient with vascular, referral has been given.  Appreciate nephrology input on this, he will need outpatient follow-up with [facility]  Anemia of chronic disease Iron deficiency anemia -This is a combination of iron deficiency and chronic disease.  We will start him on iron supplements and bowel regimen.  EPO can be given by nephrology.  Concerns for right lower lobe pneumonia; low suspicion. -Procalcitonin specimen is negative, will stop the antibiotics. -  Incentive spirometry and flutter valve  Essential hypertension -Continue Norvasc 10 mg daily, Coreg25mg   Shingles behind his right ear - Vesicular rash in different stages.   Start him on Valtrex for total of 7 days, 6 more days remaining.  This has been renally dosed.   Shortness of Breath  This is a chronic problem. The current episode started more than 1 year ago.      Review of Systems  Respiratory: Positive for shortness of breath.        Objective:   Physical Exam There were no vitals filed for this visit.  Gen: Pleasant, well-nourished, in no distress,  normal affect  ENT: No lesions,  mouth clear,  oropharynx clear, no postnasal drip  Neck: No JVD, no TMG, no carotid bruits  Lungs: No use of accessory muscles, no dullness to percussion, clear without rales or rhonchi  Cardiovascular: RRR, heart sounds normal, no murmur or gallops, no peripheral edema  Abdomen: soft and NT, no HSM,  BS normal  Musculoskeletal: No deformities, no cyanosis or clubbing  Neuro: alert, non focal  Skin: Warm, no lesions or rashes  No results found.        Assessment & Plan:

## 2018-07-08 ENCOUNTER — Inpatient Hospital Stay: Payer: Self-pay | Admitting: Critical Care Medicine

## 2018-08-13 NOTE — Progress Notes (Deleted)
Cardiology Office Note:    Date:  08/13/2018   ID:  Corey Park, DOB [birth year], MRN [id number]  PCP:  Patient, No Pcp Per  Cardiologist:  Candee Furbish, MD *** Electrophysiologist:  None   Referring MD: No ref. provider found   No chief complaint on file. ***  History of Present Illness:    Corey Park is a 48 y.o. male with heart failure with preserved ejection fraction, chronic kidney disease stage IV-V, hypertension, diabetes, anemia of chronic disease.  The patient has had several admissions for decompensated heart failure.  He was seen by cardiology in June 2018 (Dr. Marlou Porch) during the hospitalization.  He was admitted in 05/2018 with decompensated heart failure complicated by possible pneumonia.  Echocardiogram demonstrated reduced LV function with an EF of 25-30%.  He was last seen in the office 07/03/2018.   Corey Park ***  Prior CV studies:   The following studies were reviewed today:  Echocardiogram 06/17/18 Mod LVH, EF 25-30, diff HK, mild MR, mod LAE, PASP 36  Echocardiogram 12/12/16 EF 55-60, normal wall motion, trivial MR, severe LAE, normal RVSF  Past Medical History:  Diagnosis Date  . Asthma   . Diabetes mellitus without complication ([place])   . Hypertension   . Renal disorder    Surgical Hx: The patient  has no past surgical history on file.   Current Medications: No outpatient medications have been marked as taking for the 08/14/18 encounter (Appointment) with Richardson Dopp T, PA-C.     Allergies:   Patient has no known allergies.   Social History   Tobacco Use  . Smoking status: Never Smoker  . Smokeless tobacco: Never Used  Substance Use Topics  . Alcohol use: Never    Frequency: Never    Comment: denies  . Drug use: Never    Types: Marijuana    Comment: denies     Family Hx: The patient's family history includes CAD in his mother; Hypertension in his mother. There is no history of Diabetes, Stroke, Cancer, or Kidney failure.  ROS:    Please see the history of present illness.    ROS All other systems reviewed and are negative.   EKGs/Labs/Other Test Reviewed:    EKG:  EKG is *** ordered today.  The ekg ordered today demonstrates ***  Recent Labs: 06/18/2018: B Natriuretic Peptide 1,763.6 06/19/2018: BUN 59; BUN 59; Creatinine, Ser 5.73; Creatinine, Ser 5.73; Hemoglobin 8.8; Magnesium 1.9; Platelets 201; Potassium 4.2; Potassium 4.3; Sodium 141; Sodium 140   Recent Lipid Panel Lab Results  Component Value Date/Time   CHOL 127 06/17/2018 04:44 AM   TRIG 59 06/17/2018 04:44 AM   HDL 29 (L) 06/17/2018 04:44 AM   CHOLHDL 4.4 06/17/2018 04:44 AM   LDLCALC 86 06/17/2018 04:44 AM    Physical Exam:    VS:  There were no vitals taken for this visit.    Wt Readings from Last 3 Encounters:  07/03/18 237 lb (107.5 kg)  06/19/18 211 lb 6.4 oz (95.9 kg)  01/27/18 225 lb (102.1 kg)     ***Physical Exam  ASSESSMENT & PLAN:    No diagnosis found.***  Chronic systolic heart failure (HCC)  EF 25-30.  NYHA 2.  He is not on ACE/ARB or spironolactone due to CKD.  Continue current dose of Carvedilol.  Decrease Imdur to 30 mg QD due to complaints of HAs.  I will increase his Hydralazine to 50 mg TID.  We will likely need to increase this further.  Continue  current dose of Lasix.  I have encouraged him to weigh daily.    Dilated cardiomyopathy ([place]) Likely related to uncontrolled HTN.  He had diff HK on Echo.  No focal wall motion abnl.  He is unable to undergo Cardiac Catheterization at this time as he is not yet on dialysis.  We will continue to titrate medications for CHF and plan on a repeat echo at some point.  If his EF does not recover, he will likely need Cardiac Catheterization once he is on dialysis.  If his EF returns to normal with optimal therapy, we will likely be able to avoid Cardiac Catheterization.    Stage 4 chronic kidney disease ([place]) Follow up as planned with Nephrology.   Type 2 diabetes mellitus  with microalbuminuria, without long-term current use of insulin ([place]) Follow up with PCP.  He does not appear to be on any medications at this time for diabetes.    Essential hypertension  BP above target.  Adjust medications as outlined.   Dispo:  No follow-ups on file.   Medication Adjustments/Labs and Tests Ordered: Current medicines are reviewed at length with the patient today.  Concerns regarding medicines are outlined above.  Tests Ordered: No orders of the defined types were placed in this encounter.  Medication Changes: No orders of the defined types were placed in this encounter.   Signed, Richardson Dopp, PA-C  08/13/2018 10:36 PM    [facility] [place], [place], [place]  [address] Phone: [phone]; Fax: [phone]

## 2018-08-14 ENCOUNTER — Ambulatory Visit: Payer: Self-pay | Admitting: Physician Assistant

## 2018-08-14 DIAGNOSIS — R0989 Other specified symptoms and signs involving the circulatory and respiratory systems: Secondary | ICD-10-CM

## 2018-08-15 ENCOUNTER — Encounter: Payer: Self-pay | Admitting: Physician Assistant

## 2018-12-21 ENCOUNTER — Other Ambulatory Visit: Payer: Self-pay

## 2018-12-21 ENCOUNTER — Inpatient Hospital Stay (HOSPITAL_COMMUNITY)
Admission: EM | Admit: 2018-12-21 | Discharge: 2018-12-24 | DRG: 378 | Disposition: A | Discharge status: Home / Self Care | Payer: Medicaid Other | Attending: Nephrology | Admitting: Nephrology

## 2018-12-21 ENCOUNTER — Emergency Department (HOSPITAL_COMMUNITY): Payer: Medicaid Other

## 2018-12-21 DIAGNOSIS — Z6832 Body mass index (BMI) 32.0-32.9, adult: Secondary | ICD-10-CM | POA: Diagnosis not present

## 2018-12-21 DIAGNOSIS — Z1159 Encounter for screening for other viral diseases: Secondary | ICD-10-CM | POA: Diagnosis not present

## 2018-12-21 DIAGNOSIS — I420 Dilated cardiomyopathy: Secondary | ICD-10-CM | POA: Diagnosis present

## 2018-12-21 DIAGNOSIS — Z79899 Other long term (current) drug therapy: Secondary | ICD-10-CM

## 2018-12-21 DIAGNOSIS — Z794 Long term (current) use of insulin: Secondary | ICD-10-CM | POA: Diagnosis not present

## 2018-12-21 DIAGNOSIS — Z8249 Family history of ischemic heart disease and other diseases of the circulatory system: Secondary | ICD-10-CM

## 2018-12-21 DIAGNOSIS — E1165 Type 2 diabetes mellitus with hyperglycemia: Secondary | ICD-10-CM | POA: Diagnosis present

## 2018-12-21 DIAGNOSIS — N186 End stage renal disease: Secondary | ICD-10-CM

## 2018-12-21 DIAGNOSIS — E669 Obesity, unspecified: Secondary | ICD-10-CM | POA: Diagnosis present

## 2018-12-21 DIAGNOSIS — I110 Hypertensive heart disease with heart failure: Secondary | ICD-10-CM | POA: Diagnosis present

## 2018-12-21 DIAGNOSIS — K317 Polyp of stomach and duodenum: Secondary | ICD-10-CM | POA: Diagnosis present

## 2018-12-21 DIAGNOSIS — N185 Chronic kidney disease, stage 5: Secondary | ICD-10-CM | POA: Diagnosis present

## 2018-12-21 DIAGNOSIS — E785 Hyperlipidemia, unspecified: Secondary | ICD-10-CM | POA: Diagnosis present

## 2018-12-21 DIAGNOSIS — D631 Anemia in chronic kidney disease: Secondary | ICD-10-CM | POA: Diagnosis present

## 2018-12-21 DIAGNOSIS — N179 Acute kidney failure, unspecified: Secondary | ICD-10-CM

## 2018-12-21 DIAGNOSIS — D509 Iron deficiency anemia, unspecified: Secondary | ICD-10-CM | POA: Diagnosis present

## 2018-12-21 DIAGNOSIS — D649 Anemia, unspecified: Secondary | ICD-10-CM | POA: Diagnosis present

## 2018-12-21 DIAGNOSIS — Z992 Dependence on renal dialysis: Secondary | ICD-10-CM

## 2018-12-21 DIAGNOSIS — I5022 Chronic systolic (congestive) heart failure: Secondary | ICD-10-CM | POA: Diagnosis present

## 2018-12-21 DIAGNOSIS — N189 Chronic kidney disease, unspecified: Secondary | ICD-10-CM | POA: Diagnosis present

## 2018-12-21 DIAGNOSIS — E1122 Type 2 diabetes mellitus with diabetic chronic kidney disease: Secondary | ICD-10-CM | POA: Diagnosis present

## 2018-12-21 DIAGNOSIS — K259 Gastric ulcer, unspecified as acute or chronic, without hemorrhage or perforation: Secondary | ICD-10-CM | POA: Diagnosis present

## 2018-12-21 DIAGNOSIS — I132 Hypertensive heart and chronic kidney disease with heart failure and with stage 5 chronic kidney disease, or end stage renal disease: Secondary | ICD-10-CM | POA: Diagnosis present

## 2018-12-21 DIAGNOSIS — D500 Iron deficiency anemia secondary to blood loss (chronic): Secondary | ICD-10-CM | POA: Diagnosis present

## 2018-12-21 DIAGNOSIS — J45909 Unspecified asthma, uncomplicated: Secondary | ICD-10-CM | POA: Diagnosis present

## 2018-12-21 DIAGNOSIS — K922 Gastrointestinal hemorrhage, unspecified: Principal | ICD-10-CM | POA: Diagnosis present

## 2018-12-21 DIAGNOSIS — E119 Type 2 diabetes mellitus without complications: Secondary | ICD-10-CM

## 2018-12-21 HISTORY — DX: Anemia, unspecified: D64.9

## 2018-12-21 LAB — CBC WITH DIFFERENTIAL/PLATELET
Abs Immature Granulocytes: 0.03 10*3/uL (ref 0.00–0.07)
Abs Immature Granulocytes: 0.03 10*3/uL (ref 0.00–0.07)
Abs Immature Granulocytes: 0.03 10*3/uL (ref 0.00–0.07)
Abs Immature Granulocytes: 0.03 10*3/uL (ref 0.00–0.07)
Basophils Absolute: 0 10*3/uL (ref 0.0–0.1)
Basophils Absolute: 0 10*3/uL (ref 0.0–0.1)
Basophils Absolute: 0 10*3/uL (ref 0.0–0.1)
Basophils Absolute: 0 10*3/uL (ref 0.0–0.1)
Basophils Relative: 0 %
Basophils Relative: 0 %
Basophils Relative: 0 %
Basophils Relative: 0 %
Eosinophils Absolute: 0 10*3/uL (ref 0.0–0.5)
Eosinophils Absolute: 0.1 10*3/uL (ref 0.0–0.5)
Eosinophils Absolute: 0.1 10*3/uL (ref 0.0–0.5)
Eosinophils Absolute: 0.2 10*3/uL (ref 0.0–0.5)
Eosinophils Relative: 1 %
Eosinophils Relative: 1 %
Eosinophils Relative: 2 %
Eosinophils Relative: 3 %
HCT: 15 % — ABNORMAL LOW (ref 39.0–52.0)
HCT: 15.5 % — ABNORMAL LOW (ref 39.0–52.0)
HCT: 15.8 % — ABNORMAL LOW (ref 39.0–52.0)
HCT: 20 % — ABNORMAL LOW (ref 39.0–52.0)
Hemoglobin: 4.6 g/dL — CL (ref 13.0–17.0)
Hemoglobin: 4.9 g/dL — CL (ref 13.0–17.0)
Hemoglobin: 5.2 g/dL — CL (ref 13.0–17.0)
Hemoglobin: 6.8 g/dL — CL (ref 13.0–17.0)
Immature Granulocytes: 0 %
Immature Granulocytes: 0 %
Immature Granulocytes: 0 %
Immature Granulocytes: 0 %
Lymphocytes Relative: 9 %
Lymphocytes Relative: 14 %
Lymphocytes Relative: 16 %
Lymphocytes Relative: 17 %
Lymphs Abs: 0.7 10*3/uL (ref 0.7–4.0)
Lymphs Abs: 1.2 10*3/uL (ref 0.7–4.0)
Lymphs Abs: 1.3 10*3/uL (ref 0.7–4.0)
Lymphs Abs: 1.3 10*3/uL (ref 0.7–4.0)
MCH: 28.9 pg (ref 26.0–34.0)
MCH: 29.9 pg (ref 26.0–34.0)
MCH: 30.4 pg (ref 26.0–34.0)
MCH: 30.1 pg (ref 26.0–34.0)
MCHC: 30.7 g/dL (ref 30.0–36.0)
MCHC: 31.6 g/dL (ref 30.0–36.0)
MCHC: 32.9 g/dL (ref 30.0–36.0)
MCHC: 34 g/dL (ref 30.0–36.0)
MCV: 94.3 fL (ref 80.0–100.0)
MCV: 94.5 fL (ref 80.0–100.0)
MCV: 92.4 fL (ref 80.0–100.0)
MCV: 88.5 fL (ref 80.0–100.0)
Monocytes Absolute: 0.3 10*3/uL (ref 0.1–1.0)
Monocytes Absolute: 0.6 10*3/uL (ref 0.1–1.0)
Monocytes Absolute: 0.6 10*3/uL (ref 0.1–1.0)
Monocytes Absolute: 0.7 10*3/uL (ref 0.1–1.0)
Monocytes Relative: 4 %
Monocytes Relative: 7 %
Monocytes Relative: 7 %
Monocytes Relative: 9 %
Neutro Abs: 6.8 10*3/uL (ref 1.7–7.7)
Neutro Abs: 6.3 10*3/uL (ref 1.7–7.7)
Neutro Abs: 6.1 10*3/uL (ref 1.7–7.7)
Neutro Abs: 5.4 10*3/uL (ref 1.7–7.7)
Neutrophils Relative %: 86 %
Neutrophils Relative %: 78 %
Neutrophils Relative %: 75 %
Neutrophils Relative %: 71 %
Platelets: 154 10*3/uL (ref 150–400)
Platelets: 136 10*3/uL — ABNORMAL LOW (ref 150–400)
Platelets: 151 10*3/uL (ref 150–400)
Platelets: 143 10*3/uL — ABNORMAL LOW (ref 150–400)
RBC: 1.59 MIL/uL — ABNORMAL LOW (ref 4.22–5.81)
RBC: 1.64 MIL/uL — ABNORMAL LOW (ref 4.22–5.81)
RBC: 1.71 MIL/uL — ABNORMAL LOW (ref 4.22–5.81)
RBC: 2.26 MIL/uL — ABNORMAL LOW (ref 4.22–5.81)
RDW: 14.7 % (ref 11.5–15.5)
RDW: 14.4 % (ref 11.5–15.5)
RDW: 14.1 % (ref 11.5–15.5)
RDW: 14.8 % (ref 11.5–15.5)
WBC: 8 10*3/uL (ref 4.0–10.5)
WBC: 8.2 10*3/uL (ref 4.0–10.5)
WBC: 8.1 10*3/uL (ref 4.0–10.5)
WBC: 7.6 10*3/uL (ref 4.0–10.5)
nRBC: 0 % (ref 0.0–0.2)
nRBC: 0 % (ref 0.0–0.2)
nRBC: 0 % (ref 0.0–0.2)
nRBC: 0 % (ref 0.0–0.2)

## 2018-12-21 LAB — GLUCOSE, CAPILLARY
Glucose-Capillary: 123 mg/dL — ABNORMAL HIGH (ref 70–99)
Glucose-Capillary: 172 mg/dL — ABNORMAL HIGH (ref 70–99)
Glucose-Capillary: 137 mg/dL — ABNORMAL HIGH (ref 70–99)
Glucose-Capillary: 161 mg/dL — ABNORMAL HIGH (ref 70–99)

## 2018-12-21 LAB — URINALYSIS, ROUTINE W REFLEX MICROSCOPIC
Bacteria, UA: NONE SEEN
Bilirubin Urine: NEGATIVE
Glucose, UA: NEGATIVE mg/dL
Hgb urine dipstick: NEGATIVE
Ketones, ur: NEGATIVE mg/dL
Leukocytes,Ua: NEGATIVE
Nitrite: NEGATIVE
Protein, ur: 100 mg/dL — AB
Specific Gravity, Urine: 1.008 (ref 1.005–1.030)
pH: 5 (ref 5.0–8.0)

## 2018-12-21 LAB — HEMOGLOBIN A1C
Hgb A1c MFr Bld: 6 % — ABNORMAL HIGH (ref 4.8–5.6)
Mean Plasma Glucose: 125.5 mg/dL

## 2018-12-21 LAB — COMPREHENSIVE METABOLIC PANEL
ALT: 19 U/L (ref 0–44)
ALT: 17 U/L (ref 0–44)
AST: 20 U/L (ref 15–41)
AST: 17 U/L (ref 15–41)
Albumin: 2.3 g/dL — ABNORMAL LOW (ref 3.5–5.0)
Albumin: 2.2 g/dL — ABNORMAL LOW (ref 3.5–5.0)
Alkaline Phosphatase: 44 U/L (ref 38–126)
Alkaline Phosphatase: 42 U/L (ref 38–126)
Anion gap: 10 (ref 5–15)
Anion gap: 12 (ref 5–15)
BUN: 110 mg/dL — ABNORMAL HIGH (ref 6–20)
BUN: 114 mg/dL — ABNORMAL HIGH (ref 6–20)
CO2: 18 mmol/L — ABNORMAL LOW (ref 22–32)
CO2: 19 mmol/L — ABNORMAL LOW (ref 22–32)
Calcium: 7.8 mg/dL — ABNORMAL LOW (ref 8.9–10.3)
Calcium: 7.9 mg/dL — ABNORMAL LOW (ref 8.9–10.3)
Chloride: 108 mmol/L (ref 98–111)
Chloride: 109 mmol/L (ref 98–111)
Creatinine, Ser: 7.75 mg/dL — ABNORMAL HIGH (ref 0.61–1.24)
Creatinine, Ser: 7.16 mg/dL — ABNORMAL HIGH (ref 0.61–1.24)
GFR calc Af Amer: 9 mL/min — ABNORMAL LOW (ref >60)
GFR calc Af Amer: 10 mL/min — ABNORMAL LOW (ref >60)
GFR calc non Af Amer: 8 mL/min — ABNORMAL LOW (ref >60)
GFR calc non Af Amer: 8 mL/min — ABNORMAL LOW (ref >60)
Glucose, Bld: 152 mg/dL — ABNORMAL HIGH (ref 70–99)
Glucose, Bld: 134 mg/dL — ABNORMAL HIGH (ref 70–99)
Potassium: 4.4 mmol/L (ref 3.5–5.1)
Potassium: 4.8 mmol/L (ref 3.5–5.1)
Sodium: 136 mmol/L (ref 135–145)
Sodium: 140 mmol/L (ref 135–145)
Total Bilirubin: 0.4 mg/dL (ref 0.3–1.2)
Total Bilirubin: 0.5 mg/dL (ref 0.3–1.2)
Total Protein: 4.9 g/dL — ABNORMAL LOW (ref 6.5–8.1)
Total Protein: 4.7 g/dL — ABNORMAL LOW (ref 6.5–8.1)

## 2018-12-21 LAB — IRON AND TIBC
Iron: 48 ug/dL (ref 45–182)
Saturation Ratios: 16 % — ABNORMAL LOW (ref 17.9–39.5)
TIBC: 295 ug/dL (ref 250–450)
UIBC: 247 ug/dL

## 2018-12-21 LAB — HIV ANTIBODY (ROUTINE TESTING W REFLEX): HIV Screen 4th Generation wRfx: NONREACTIVE

## 2018-12-21 LAB — CBG MONITORING, ED: Glucose-Capillary: 154 mg/dL — ABNORMAL HIGH (ref 70–99)

## 2018-12-21 LAB — RETICULOCYTES
Immature Retic Fract: 29.3 % — ABNORMAL HIGH (ref 2.3–15.9)
RBC.: 1.51 MIL/uL — ABNORMAL LOW (ref 4.22–5.81)
Retic Count, Absolute: 59 10*3/uL (ref 19.0–186.0)
Retic Ct Pct: 3.9 % — ABNORMAL HIGH (ref 0.4–3.1)

## 2018-12-21 LAB — TROPONIN I (HIGH SENSITIVITY)
Troponin I (High Sensitivity): 103 ng/L (ref <18)
Troponin I (High Sensitivity): 107 ng/L (ref <18)

## 2018-12-21 LAB — PREPARE RBC (CROSSMATCH)

## 2018-12-21 LAB — FERRITIN: Ferritin: 22 ng/mL — ABNORMAL LOW (ref 24–336)

## 2018-12-21 LAB — BRAIN NATRIURETIC PEPTIDE: B Natriuretic Peptide: 553.1 pg/mL — ABNORMAL HIGH (ref 0.0–100.0)

## 2018-12-21 LAB — POC OCCULT BLOOD, ED: Fecal Occult Bld: POSITIVE — AB

## 2018-12-21 LAB — VITAMIN B12: Vitamin B-12: 188 pg/mL (ref 180–914)

## 2018-12-21 LAB — FOLATE: Folate: 8.7 ng/mL (ref >5.9)

## 2018-12-21 LAB — SARS CORONAVIRUS 2 BY RT PCR (HOSPITAL ORDER, PERFORMED IN ~~LOC~~ HOSPITAL LAB): SARS Coronavirus 2: NEGATIVE

## 2018-12-21 LAB — ABO/RH: ABO/RH(D): O POS

## 2018-12-21 LAB — MRSA PCR SCREENING: MRSA by PCR: NEGATIVE

## 2018-12-21 MED ORDER — SODIUM CHLORIDE 0.9 % IV BOLUS
500.0000 mL | Freq: Once | INTRAVENOUS | Status: AC
  Administered 2018-12-21: 500 mL via INTRAVENOUS

## 2018-12-21 MED ORDER — ONDANSETRON HCL 4 MG/2ML IJ SOLN: 4.0000 mg | Freq: Four times a day (QID) | INTRAMUSCULAR | Status: DC | PRN

## 2018-12-21 MED ORDER — SODIUM CHLORIDE 0.9% IV SOLUTION: Freq: Once | INTRAVENOUS | Status: DC

## 2018-12-21 MED ORDER — INSULIN ASPART 100 UNIT/ML ~~LOC~~ SOLN
0.0000 [IU] | Freq: Three times a day (TID) | SUBCUTANEOUS | Status: DC
  Administered 2018-12-21: 1 [IU] via SUBCUTANEOUS
  Administered 2018-12-21: 2 [IU] via SUBCUTANEOUS
  Administered 2018-12-21: 1 [IU] via SUBCUTANEOUS
  Administered 2018-12-22: 2 [IU] via SUBCUTANEOUS
  Administered 2018-12-23: 1 [IU] via SUBCUTANEOUS
  Administered 2018-12-24: 2 [IU] via SUBCUTANEOUS
  Administered 2018-12-24 (×2): 1 [IU] via SUBCUTANEOUS

## 2018-12-21 MED ORDER — PANTOPRAZOLE SODIUM 40 MG IV SOLR: 40.0000 mg | Freq: Two times a day (BID) | INTRAVENOUS | Status: DC

## 2018-12-21 MED ORDER — SODIUM CHLORIDE 0.9 % IV SOLN: 8.0000 mg/h | INTRAVENOUS | Status: DC

## 2018-12-21 MED ORDER — SODIUM CHLORIDE 0.9 % IV SOLN
80.0000 mg | Freq: Once | INTRAVENOUS | Status: AC
  Administered 2018-12-21: 80 mg via INTRAVENOUS
  Filled 2018-12-21: qty 80

## 2018-12-21 MED ORDER — PANTOPRAZOLE SODIUM 40 MG IV SOLR
40.0000 mg | Freq: Two times a day (BID) | INTRAVENOUS | Status: DC
  Administered 2018-12-24: 15:00:00 40 mg via INTRAVENOUS
  Filled 2018-12-21: qty 40

## 2018-12-21 MED ORDER — FUROSEMIDE 10 MG/ML IJ SOLN
20.0000 mg | Freq: Once | INTRAMUSCULAR | Status: AC
  Administered 2018-12-21: 20 mg via INTRAVENOUS
  Filled 2018-12-21: qty 2

## 2018-12-21 MED ORDER — LABETALOL HCL 5 MG/ML IV SOLN
5.0000 mg | INTRAVENOUS | Status: DC | PRN
  Administered 2018-12-22 – 2018-12-24 (×4): 5 mg via INTRAVENOUS
  Filled 2018-12-21 (×4): qty 4

## 2018-12-21 MED ORDER — SODIUM CHLORIDE 0.9 % IV SOLN: 80.0000 mg | Freq: Once | INTRAVENOUS | Status: DC

## 2018-12-21 MED ORDER — SODIUM CHLORIDE 0.9% IV SOLUTION
Freq: Once | INTRAVENOUS | Status: AC
  Administered 2018-12-21: 250 mL via INTRAVENOUS

## 2018-12-21 MED ORDER — SODIUM CHLORIDE 0.9% IV SOLUTION
Freq: Once | INTRAVENOUS | Status: AC
  Administered 2018-12-21: 04:00:00 via INTRAVENOUS

## 2018-12-21 MED ORDER — INSULIN ASPART 100 UNIT/ML ~~LOC~~ SOLN: 0.0000 [IU] | Freq: Every day | SUBCUTANEOUS | Status: DC

## 2018-12-21 MED ORDER — SODIUM CHLORIDE 0.9 % IV SOLN
8.0000 mg/h | INTRAVENOUS | Status: DC
  Administered 2018-12-21 – 2018-12-22 (×3): 8 mg/h via INTRAVENOUS
  Filled 2018-12-21 (×4): qty 80

## 2018-12-21 MED ORDER — ONDANSETRON HCL 4 MG PO TABS: 4.0000 mg | ORAL_TABLET | Freq: Four times a day (QID) | ORAL | Status: DC | PRN

## 2018-12-21 NOTE — Progress Notes (Signed)
Lab called with a critical Hgb 4.9. Blood was collected during blood transfusion. H&H must be collected at 09:41. MD notified. Will continue to monitor.

## 2018-12-21 NOTE — Progress Notes (Signed)
Patient's mother, Alvino Chapel was called per patient's request and informed about patient's condition and the plan for EGD. All the question were answered.

## 2018-12-21 NOTE — ED Notes (Signed)
Report given to 5W RN. All questions answered 

## 2018-12-21 NOTE — Progress Notes (Signed)
Due to emergencies requiring surgery, endoscopic procedures have been delayed or canceled for today indefinitely.  We will therefore allow him to have clear liquids and reschedule his EGD for tomorrow approximately 1030 and keep him n.p.o. after midnight.  I discussed with the patient's nurse and she will let him know.  He is currently sleeping.

## 2018-12-21 NOTE — ED Notes (Signed)
MD informed of elevated trop

## 2018-12-21 NOTE — Consult Note (Signed)
[facility] Reason for consult: GI bleeding and anemia Referring Physician: [facility] hospitalist.  PCP: Renal team, Dr. Marlou Porch.  Primary GI: Corey Park is an 48 y.o. male.  HPI: He is followed by the renal team for severe CKD with creatinine 5.0-6.0 routinely was 7.7 on admission.  He is not yet on hemodialysis.  He is followed by Dr. Marlou Porch of cardiology for heart failure and has had several admissions for decompensated heart failure.  He apparently has ejection fraction of 25 to 30%.  He presented to the emergency room with severe shortness of breath that got worse during the day to the point he could not walk down stairs.  No cough or fever.  Labs revealed guaiac positive stool and hemoglobin of 4.6.  He has received his second unit of blood which is almost finished according to him.  His other problems include CHF with dilated cardiomyopathy, type 2 diabetes, hypertension. The patient adamantly denies abdominal pain, any use of NSAIDs, nausea or vomiting.  He is never had ulcers and is been on low-dose iron but has not had dark stools until the past couple of days.  He is never had EGD or colonoscopy.  Past Medical History:  Diagnosis Date  . Asthma   . Diabetes mellitus without complication ([place])   . Hypertension   . Renal disorder     No past surgical history on file.  Family History  Problem Relation Age of Onset  . CAD Mother   . Hypertension Mother   . Diabetes Neg Hx   . Stroke Neg Hx   . Cancer Neg Hx   . Kidney failure Neg Hx     Social History:  reports that he has never smoked. He has never used smokeless tobacco. He reports that he does not drink alcohol or use drugs.  Allergies: No Known Allergies  Medications; Prior to Admission medications   Medication Sig Start Date End Date Taking? Authorizing Provider  amLODipine (NORVASC) 10 MG tablet Take 1 tablet (10 mg total) by mouth daily. 06/19/18 07/19/18  Amin, Jeanella Flattery, MD   atorvastatin (LIPITOR) 20 MG tablet Take 1 tablet (20 mg total) by mouth daily at 6 PM. 06/19/18   Amin, Jeanella Flattery, MD  carvedilol (COREG) 25 MG tablet Take 1 tablet (25 mg total) by mouth 2 (two) times daily with a meal. 06/19/18 07/19/18  Amin, Jeanella Flattery, MD  ferrous sulfate 325 (65 FE) MG tablet Take 1 tablet (325 mg total) by mouth daily. 06/19/18 07/19/18  Amin, Jeanella Flattery, MD  furosemide (LASIX) 80 MG tablet Take 1 tablet (80 mg total) by mouth 2 (two) times daily. 07/05/18   Richardson Dopp T, PA-C  hydrALAZINE (APRESOLINE) 50 MG tablet Take 1 tablet (50 mg total) by mouth 3 (three) times daily. 07/03/18 10/01/18  Richardson Dopp T, PA-C  isosorbide mononitrate (IMDUR) 30 MG 24 hr tablet Take 1 tablet (30 mg total) by mouth daily. 07/03/18 10/01/18  Richardson Dopp T, PA-C  traZODone (DESYREL) 50 MG tablet Take 1 tablet (50 mg total) by mouth at bedtime as needed for up to 10 days for sleep. 06/19/18 07/03/18  Amin, Jeanella Flattery, MD   . insulin aspart  0-5 Units Subcutaneous QHS  . insulin aspart  0-9 Units Subcutaneous TID WC  . [START ON 12/24/2018] pantoprazole  40 mg Intravenous Q12H   PRN Meds labetalol, ondansetron **OR** ondansetron (ZOFRAN) IV Results for orders placed or performed during the hospital encounter of 12/21/18 (from the  past 48 hour(s))  CBG monitoring, ED     Status: Abnormal   Collection Time: 12/21/18 12:11 AM  Result Value Ref Range   Glucose-Capillary 154 (H) 70 - 99 mg/dL   Comment 1 Notify RN    Comment 2 Document in Chart   CBC with Differential/Platelet     Status: Abnormal   Collection Time: 12/21/18  1:41 AM  Result Value Ref Range   WBC 8.0 4.0 - 10.5 K/uL   RBC 1.59 (L) 4.22 - 5.81 MIL/uL   Hemoglobin 4.6 (LL) 13.0 - 17.0 g/dL    Comment: REPEATED TO VERIFY THIS CRITICAL RESULT HAS VERIFIED AND BEEN CALLED TO SARA GRINDSTAFF RN BY ZACH ROBINSON ON 06 27 2020 AT 0224, AND HAS BEEN READ BACK.     HCT 15.0 (L) 39.0 - 52.0 %   MCV 94.3 80.0 - 100.0 fL   MCH  28.9 26.0 - 34.0 pg   MCHC 30.7 30.0 - 36.0 g/dL   RDW 14.7 11.5 - 15.5 %   Platelets 154 150 - 400 K/uL   nRBC 0.0 0.0 - 0.2 %   Neutrophils Relative % 86 %   Neutro Abs 6.8 1.7 - 7.7 K/uL   Lymphocytes Relative 9 %   Lymphs Abs 0.7 0.7 - 4.0 K/uL   Monocytes Relative 4 %   Monocytes Absolute 0.3 0.1 - 1.0 K/uL   Eosinophils Relative 1 %   Eosinophils Absolute 0.0 0.0 - 0.5 K/uL   Basophils Relative 0 %   Basophils Absolute 0.0 0.0 - 0.1 K/uL   Immature Granulocytes 0 %   Abs Immature Granulocytes 0.03 0.00 - 0.07 K/uL    Comment: Performed at [facility] [address], [place], [place] [address]  Comprehensive metabolic panel     Status: Abnormal   Collection Time: 12/21/18  1:41 AM  Result Value Ref Range   Sodium 136 135 - 145 mmol/L   Potassium 4.4 3.5 - 5.1 mmol/L   Chloride 108 98 - 111 mmol/L   CO2 18 (L) 22 - 32 mmol/L   Glucose, Bld 152 (H) 70 - 99 mg/dL   BUN 110 (H) 6 - 20 mg/dL   Creatinine, Ser 7.75 (H) 0.61 - 1.24 mg/dL   Calcium 7.8 (L) 8.9 - 10.3 mg/dL   Total Protein 4.9 (L) 6.5 - 8.1 g/dL   Albumin 2.3 (L) 3.5 - 5.0 g/dL   AST 20 15 - 41 U/L   ALT 19 0 - 44 U/L   Alkaline Phosphatase 44 38 - 126 U/L   Total Bilirubin 0.4 0.3 - 1.2 mg/dL   GFR calc non Af Amer 8 (L) >60 mL/min   GFR calc Af Amer 9 (L) >60 mL/min   Anion gap 10 5 - 15    Comment: Performed at [facility], [place] [address] [place], [place] [address]  Troponin I (High Sensitivity)     Status: Abnormal   Collection Time: 12/21/18  1:41 AM  Result Value Ref Range   Troponin I (High Sensitivity) 103 (HH) <18 ng/L    Comment: CRITICAL RESULT CALLED TO, READ BACK BY AND VERIFIED WITH: GRINDSTAFF,S RN 12/21/2018 0248 JORDANS (NOTE) Elevated high sensitivity troponin I (hsTnI) values and significant  changes across serial measurements may suggest ACS but many other  chronic and acute conditions are known to elevate hsTnI results.  Refer to the Links section for chest pain  algorithms and additional  guidance. Performed at [facility], [address] [place], [place] [address]   Brain natriuretic peptide     Status: Abnormal   Collection Time: 12/21/18  1:41 AM  Result Value Ref Range   B Natriuretic Peptide 553.1 (H) 0.0 - 100.0 pg/mL    Comment: Performed at [facility] [address] [place], [place] [address]  Urinalysis, Routine w reflex microscopic     Status: Abnormal   Collection Time: 12/21/18  1:54 AM  Result Value Ref Range   Color, Urine COLORLESS (A) YELLOW   APPearance CLEAR CLEAR   Specific Gravity, Urine 1.008 1.005 - 1.030   pH 5.0 5.0 - 8.0   Glucose, UA NEGATIVE NEGATIVE mg/dL   Hgb urine dipstick NEGATIVE NEGATIVE   Bilirubin Urine NEGATIVE NEGATIVE   Ketones, ur NEGATIVE NEGATIVE mg/dL   Protein, ur 100 (A) NEGATIVE mg/dL   Nitrite NEGATIVE NEGATIVE   Leukocytes,Ua NEGATIVE NEGATIVE   RBC / HPF 0-5 0 - 5 RBC/hpf   WBC, UA 0-5 0 - 5 WBC/hpf   Bacteria, UA NONE SEEN NONE SEEN   Squamous Epithelial / LPF 0-5 0 - 5    Comment: Performed at [facility], [place] [address] [place], [place] [address]  Folate     Status: None   Collection Time: 12/21/18  2:32 AM  Result Value Ref Range   Folate 8.7 >5.9 ng/mL    Comment: Performed at [facility], [place] [address] [place], [place] [address]  Troponin I (High Sensitivity)     Status: Abnormal   Collection Time: 12/21/18  2:38 AM  Result Value Ref Range   Troponin I (High Sensitivity) 107 (HH) <18 ng/L    Comment: CRITICAL VALUE NOTED.  VALUE IS CONSISTENT WITH PREVIOUSLY REPORTED AND CALLED VALUE. (NOTE) Elevated high sensitivity troponin I (hsTnI) values and significant  changes across serial measurements may suggest ACS but many other  chronic and acute conditions are known to elevate hsTnI results.  Refer to the Links section for chest pain algorithms and additional  guidance. Performed at [facility], [place] [address], [place],  [place] [address]   Vitamin B12     Status: None   Collection Time: 12/21/18  2:38 AM  Result Value Ref Range   Vitamin B-12 188 180 - 914 pg/mL    Comment: (NOTE) This assay is not validated for testing neonatal or myeloproliferative syndrome specimens for Vitamin B12 levels. Performed at [facility], [place] [address] [place], [place] [address]   Iron and TIBC     Status: Abnormal   Collection Time: 12/21/18  2:38 AM  Result Value Ref Range   Iron 48 45 - 182 ug/dL   TIBC 295 250 - 450 ug/dL   Saturation Ratios 16 (L) 17.9 - 39.5 %   UIBC 247 ug/dL    Comment: Performed at [facility], [place] [address] [place], [place] [address]  Ferritin     Status: Abnormal   Collection Time: 12/21/18  2:38 AM  Result Value Ref Range   Ferritin 22 (L) 24 - 336 ng/mL    Comment: Performed at [facility], [place] [address] [place], [place] [address]  Reticulocytes     Status: Abnormal   Collection Time: 12/21/18  2:38 AM  Result Value Ref Range   Retic Ct Pct 3.9 (H) 0.4 - 3.1 %   RBC. 1.51 (L) 4.22 - 5.81 MIL/uL   Retic Count, Absolute 59.0 19.0 - 186.0  K/uL   Immature Retic Fract 29.3 (H) 2.3 - 15.9 %    Comment: Performed at [facility], [place] [address], [place], [place] [address]  Type and screen [place]     Status: None (Preliminary result)   Collection Time: 12/21/18  2:40 AM  Result Value Ref Range   ABO/RH(D) O POS    Antibody Screen NEG    Sample Expiration 12/24/2018,2359    Unit Number [id number]    Blood Component Type RED CELLS,LR    Unit division 00    Status of Unit ALLOCATED    Transfusion Status OK TO TRANSFUSE    Crossmatch Result Compatible    Unit Number [id number]    Blood Component Type RED CELLS,LR    Unit division 00    Status of Unit ISSUED    Transfusion Status OK TO TRANSFUSE    Crossmatch Result      Compatible Performed at [facility], [place] [address], [place], [place] [address]   ABO/Rh     Status:  None (Preliminary result)   Collection Time: 12/21/18  2:40 AM  Result Value Ref Range   ABO/RH(D)      O POS Performed at [facility] [address], [place], [place] [address]   POC occult blood, ED RN will collect     Status: Abnormal   Collection Time: 12/21/18  3:03 AM  Result Value Ref Range   Fecal Occult Bld POSITIVE (A) NEGATIVE  Prepare RBC     Status: None   Collection Time: 12/21/18  3:17 AM  Result Value Ref Range   Order Confirmation      ORDER PROCESSED BY BLOOD BANK Performed at [facility], [place] [address], [place], [place] [address]   SARS Coronavirus 2 (CEPHEID - Performed in [facility] hospital lab), Hosp Order     Status: None   Collection Time: 12/21/18  3:46 AM   Specimen: Nasopharyngeal Swab  Result Value Ref Range   SARS Coronavirus 2 NEGATIVE NEGATIVE    Comment: (NOTE) If result is NEGATIVE SARS-CoV-2 target nucleic acids are NOT DETECTED. The SARS-CoV-2 RNA is generally detectable in upper and lower  respiratory specimens during the acute phase of infection. The lowest  concentration of SARS-CoV-2 viral copies this assay can detect is 250  copies / mL. A negative result does not preclude SARS-CoV-2 infection  and should not be used as the sole basis for treatment or other  patient management decisions.  A negative result may occur with  improper specimen collection / handling, submission of specimen other  than nasopharyngeal swab, presence of viral mutation(s) within the  areas targeted by this assay, and inadequate number of viral copies  (<250 copies / mL). A negative result must be combined with clinical  observations, patient history, and epidemiological information. If result is POSITIVE SARS-CoV-2 target nucleic acids are DETECTED. The SARS-CoV-2 RNA is generally detectable in upper and lower  respiratory specimens dur ing the acute phase of infection.  Positive  results are indicative of active infection with SARS-CoV-2.   Clinical  correlation with patient history and other diagnostic information is  necessary to determine patient infection status.  Positive results do  not rule out bacterial infection or co-infection with other viruses. If result is PRESUMPTIVE POSTIVE SARS-CoV-2 nucleic acids MAY BE PRESENT.   A presumptive positive result was obtained on the submitted specimen  and confirmed on repeat testing.  While 2019  novel coronavirus  (SARS-CoV-2) nucleic acids may be present in the submitted sample  additional confirmatory testing may be necessary for epidemiological  and / or clinical management purposes  to differentiate between  SARS-CoV-2 and other Sarbecovirus currently known to infect humans.  If clinically indicated additional testing with an alternate test  methodology [phone]) is advised. The SARS-CoV-2 RNA is generally  detectable in upper and lower respiratory sp ecimens during the acute  phase of infection. The expected result is Negative. Fact Sheet for Patients:  StrictlyIdeas.no Fact Sheet for Healthcare Providers: BankingDealers.co.za This test is not yet approved or cleared by the [place] FDA and has been authorized for detection and/or diagnosis of SARS-CoV-2 by FDA under an Emergency Use Authorization (EUA).  This EUA will remain in effect (meaning this test can be used) for the duration of the COVID-19 declaration under Section 564(b)(1) of the Act, 21 U.S.C. section 360bbb-3(b)(1), unless the authorization is terminated or revoked sooner. Performed at [facility], [place] [address], [place], [place] [address]     Dg Chest 2 View  Result Date: 12/21/2018 CLINICAL DATA:  48 year old male with shortness of breath. EXAM: CHEST - 2 VIEW COMPARISON:  Chest radiograph dated 06/16/2018 FINDINGS: The heart size and mediastinal contours are within normal limits. Both lungs are clear. The visualized skeletal structures are  unremarkable. IMPRESSION: No active cardiopulmonary disease. Electronically Signed   By: Anner Crete M.D.   On: 12/21/2018 01:40   Ct Head Wo Contrast  Result Date: 12/21/2018 CLINICAL DATA:  Headache. EXAM: CT HEAD WITHOUT CONTRAST TECHNIQUE: Contiguous axial images were obtained from the base of the skull through the vertex without intravenous contrast. COMPARISON:  December 12, 2016 FINDINGS: Brain: No evidence of acute infarction, hemorrhage, hydrocephalus, extra-axial collection or mass lesion/mass effect. Vascular: No hyperdense vessel or unexpected calcification. Skull: Normal. Negative for fracture or focal lesion. Sinuses/Orbits: There is mucosal thickening involving the left maxillary sinus and ethmoid air cells. The remaining paranasal sinuses and mastoid air cells are essentially clear. Other: None. IMPRESSION: No acute intracranial abnormality. Electronically Signed   By: Constance Holster M.D.   On: 12/21/2018 01:25               Blood pressure 130/70, pulse (!) 105, temperature 98.6 F (37 C), temperature source Oral, resp. rate 19, height 6\' 1"  (1.854 m), weight 113.4 kg, SpO2 100 %.  Physical exam:   General--African-American male ENT--nonicteric Neck--no gross lymphadenopathy Heart--regular rate and rhythm without murmurs or gallops Lungs--few basilar rales Abdomen--nondistended, soft and completely nontender Psych--somewhat withdrawn but answers questions appropriately   Assessment: 1.  GI bleed.  Probably upper GI but no clear risk factors.  Think we need to start with EGD. 2.  CKD.  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  Not yet on dialysis 3.  Dilated cardiomyopathy with ejection fraction 30%.  History of CHF followed by Dr. Marlou Porch 4.  Hypertension  Plan: 1.  We will proceed later with EGD.  Have discussed this with patient that he will be sedated etc.  His coronavirus test is negative.  Hopefully if he has an ulcer that is not  actively bleeding we will be able to feed him and get him home in the near future.  He is agreeable to proceed and we will get him on the schedule later today.   Nancy Fetter 12/21/2018, 6:58 AM   This note was created using voice recognition software and minor errors may Have  occurred unintentionally. Pager: [phone] If no answer or after hours call [phone]

## 2018-12-21 NOTE — Progress Notes (Signed)
CRITICAL VALUE ALERT  Critical Value:  Hgb 4.9  Date & Time Notied:  12/21/2018; 8AM  Provider Notified: Dr. Earnest Conroy  Orders Received/Actions taken: called MD (lab was collected during blood transfusion.)

## 2018-12-21 NOTE — ED Provider Notes (Signed)
[facility]ENC[facility] DEPARTMENT Provider Note   CSN: [id number] Arrival date & time: 12/21/18  0006     History   Chief Complaint Chief Complaint  Patient presents with  . Shortness of Breath  . Hyperglycemia    HPI Corey Park is a 48 y.o. male.      48 y.o. male with heart failure with reduced ejection fraction 25-30%, chronic kidney disease stage IV-V, hypertension, diabetes, anemia of chronic disease.  The patient has had several admissions for decompensated heart failure.  Patient presents via EMS with sudden onset difficulty breathing, generalized weakness and headache.  States he felt well throughout the day but then became acutely tonight.  Developed some shortness of breath and states he could not walk down the stairs due to difficulty breathing.  There is been no cough or fever.  He also has a gradual onset left-sided headache with generalized weakness.  No fever.  No chest pain.  No abdominal pain, nausea or vomiting.  States compliance with his blood pressure medications and Lasix with states he is not taking anything for his diabetes because he is due to see an endocrinologist.  He denies any focal weakness, numbness or tingling.  No dizziness or lightheadedness.  No sick contacts or recent travel.  The history is provided by the patient and the EMS personnel.  Shortness of Breath Associated symptoms: cough and headaches   Associated symptoms: no abdominal pain, no chest pain, no rash and no vomiting   Hyperglycemia Associated symptoms: fatigue, shortness of breath and weakness   Associated symptoms: no abdominal pain, no chest pain, no dizziness, no dysuria, no nausea and no vomiting     Past Medical History:  Diagnosis Date  . Asthma   . Diabetes mellitus without complication ([place])   . Hypertension   . Renal disorder     Patient Active Problem List   Diagnosis Date Noted  . Chronic systolic heart failure ([place]) 07/03/2018  . Dilated  cardiomyopathy ([place]) 07/03/2018  . Elevated troponin   . Chronic kidney disease 06/17/2018  . Hypomagnesemia 12/13/2016  . Hypertensive urgency 12/11/2016  . AKI (acute kidney injury) ([place]) 12/11/2016  . Fluid overload 12/11/2016  . HLD (hyperlipidemia) 06/14/2007  . DM (diabetes mellitus), type 2 ([place]) 06/11/2007  . Hypertensive heart disease with CHF (congestive heart failure) ([place]) 06/11/2007    No past surgical history on file.      Home Medications    Prior to Admission medications   Medication Sig Start Date End Date Taking? Authorizing Provider  amLODipine (NORVASC) 10 MG tablet Take 1 tablet (10 mg total) by mouth daily. 06/19/18 07/19/18  Amin, Jeanella Flattery, MD  atorvastatin (LIPITOR) 20 MG tablet Take 1 tablet (20 mg total) by mouth daily at 6 PM. 06/19/18   Amin, Jeanella Flattery, MD  carvedilol (COREG) 25 MG tablet Take 1 tablet (25 mg total) by mouth 2 (two) times daily with a meal. 06/19/18 07/19/18  Amin, Jeanella Flattery, MD  ferrous sulfate 325 (65 FE) MG tablet Take 1 tablet (325 mg total) by mouth daily. 06/19/18 07/19/18  Amin, Jeanella Flattery, MD  furosemide (LASIX) 80 MG tablet Take 1 tablet (80 mg total) by mouth 2 (two) times daily. 07/05/18   Richardson Dopp T, PA-C  hydrALAZINE (APRESOLINE) 50 MG tablet Take 1 tablet (50 mg total) by mouth 3 (three) times daily. 07/03/18 10/01/18  Richardson Dopp T, PA-C  isosorbide mononitrate (IMDUR) 30 MG 24 hr tablet Take 1 tablet (30 mg  total) by mouth daily. 07/03/18 10/01/18  Richardson Dopp T, PA-C  traZODone (DESYREL) 50 MG tablet Take 1 tablet (50 mg total) by mouth at bedtime as needed for up to 10 days for sleep. 06/19/18 07/03/18  Damita Lack, MD    Family History Family History  Problem Relation Age of Onset  . CAD Mother   . Hypertension Mother   . Diabetes Neg Hx   . Stroke Neg Hx   . Cancer Neg Hx   . Kidney failure Neg Hx     Social History Social History   Tobacco Use  . Smoking status: Never Smoker  . Smokeless  tobacco: Never Used  Substance Use Topics  . Alcohol use: Never    Frequency: Never    Comment: denies  . Drug use: Never    Types: Marijuana    Comment: denies     Allergies   Patient has no known allergies.   Review of Systems Review of Systems  Constitutional: Positive for activity change, appetite change and fatigue.  HENT: Negative for congestion.   Eyes: Negative for visual disturbance.  Respiratory: Positive for cough and shortness of breath. Negative for chest tightness.   Cardiovascular: Negative for chest pain and leg swelling.  Gastrointestinal: Negative for abdominal pain, nausea and vomiting.  Genitourinary: Negative for dysuria and hematuria.  Musculoskeletal: Positive for arthralgias and myalgias.  Skin: Negative for rash.  Neurological: Positive for weakness and headaches. Negative for dizziness and light-headedness.    all other systems are negative except as noted in the HPI and PMH.    Physical Exam Updated Vital Signs BP 138/75   Pulse (!) 104   Temp 97.9 F (36.6 C) (Oral)   Resp 19   Ht 6\' 1"  (1.854 m)   Wt 113.4 kg   SpO2 100%   BMI 32.98 kg/m   Physical Exam Vitals signs and nursing note reviewed.  Constitutional:      General: He is not in acute distress.    Appearance: He is well-developed. He is obese.     Comments: Speaking in full sentences. No distress  HENT:     Head: Normocephalic and atraumatic.     Right Ear: Tympanic membrane normal.     Left Ear: Tympanic membrane normal.     Mouth/Throat:     Pharynx: No oropharyngeal exudate.  Eyes:     Conjunctiva/sclera: Conjunctivae normal.     Pupils: Pupils are equal, round, and reactive to light.  Neck:     Musculoskeletal: Normal range of motion and neck supple.     Comments: No meningismus. Cardiovascular:     Rate and Rhythm: Regular rhythm. Tachycardia present.     Heart sounds: Normal heart sounds. No murmur.  Pulmonary:     Effort: Pulmonary effort is normal. No  respiratory distress.     Breath sounds: Rales present.  Chest:     Chest wall: No tenderness.  Abdominal:     Palpations: Abdomen is soft.     Tenderness: There is no abdominal tenderness. There is no guarding or rebound.  Genitourinary:    Comments: Melena on rectal exam Musculoskeletal: Normal range of motion.        General: No tenderness.     Right lower leg: Edema present.     Left lower leg: Edema present.  Skin:    General: Skin is warm.     Capillary Refill: Capillary refill takes less than 2 seconds.  Neurological:  General: No focal deficit present.     Mental Status: He is alert and oriented to person, place, and time. Mental status is at baseline.     Cranial Nerves: No cranial nerve deficit.     Motor: No abnormal muscle tone.     Coordination: Coordination normal.     Comments: No ataxia on finger to nose bilaterally. No pronator drift. 5/5 strength throughout. CN 2-12 intact.Equal grip strength. Sensation intact.   Psychiatric:        Behavior: Behavior normal.      ED Treatments / Results  Labs (all labs ordered are listed, but only abnormal results are displayed) Labs Reviewed  CBC WITH DIFFERENTIAL/PLATELET - Abnormal; Notable for the following components:      Result Value   RBC 1.59 (*)    Hemoglobin 4.6 (*)    HCT 15.0 (*)    All other components within normal limits  COMPREHENSIVE METABOLIC PANEL - Abnormal; Notable for the following components:   CO2 18 (*)    Glucose, Bld 152 (*)    BUN 110 (*)    Creatinine, Ser 7.75 (*)    Calcium 7.8 (*)    Total Protein 4.9 (*)    Albumin 2.3 (*)    GFR calc non Af Amer 8 (*)    GFR calc Af Amer 9 (*)    All other components within normal limits  TROPONIN I (HIGH SENSITIVITY) - Abnormal; Notable for the following components:   Troponin I (High Sensitivity) 103 (*)    All other components within normal limits  BRAIN NATRIURETIC PEPTIDE - Abnormal; Notable for the following components:   B  Natriuretic Peptide 553.1 (*)    All other components within normal limits  URINALYSIS, ROUTINE W REFLEX MICROSCOPIC - Abnormal; Notable for the following components:   Color, Urine COLORLESS (*)    Protein, ur 100 (*)    All other components within normal limits  IRON AND TIBC - Abnormal; Notable for the following components:   Saturation Ratios 16 (*)    All other components within normal limits  FERRITIN - Abnormal; Notable for the following components:   Ferritin 22 (*)    All other components within normal limits  RETICULOCYTES - Abnormal; Notable for the following components:   Retic Ct Pct 3.9 (*)    RBC. 1.51 (*)    Immature Retic Fract 29.3 (*)    All other components within normal limits  CBG MONITORING, ED - Abnormal; Notable for the following components:   Glucose-Capillary 154 (*)    All other components within normal limits  POC OCCULT BLOOD, ED - Abnormal; Notable for the following components:   Fecal Occult Bld POSITIVE (*)    All other components within normal limits  SARS CORONAVIRUS 2 (HOSPITAL ORDER, [place] [facility])  VITAMIN B12  FOLATE  TROPONIN I (HIGH SENSITIVITY)  I-STAT VENOUS BLOOD GAS, ED  TYPE AND SCREEN  PREPARE RBC (CROSSMATCH)  ABO/RH    EKG EKG Interpretation  Date/Time:  Saturday December 21 2018 00:13:32 EDT Ventricular Rate:  101 PR Interval:    QRS Duration: 108 QT Interval:  376 QTC Calculation: 488 R Axis:   86 Text Interpretation:  Sinus tachycardia Abnormal R-wave progression, late transition Probable left ventricular hypertrophy Abnrm T, consider ischemia, anterolateral lds No significant change was found Confirmed by Ezequiel Essex [phone]) on 12/21/2018 12:21:20 AM   Radiology Dg Chest 2 View  Result Date: 12/21/2018 CLINICAL DATA:  48 year old  male with shortness of breath. EXAM: CHEST - 2 VIEW COMPARISON:  Chest radiograph dated 06/16/2018 FINDINGS: The heart size and mediastinal contours are within  normal limits. Both lungs are clear. The visualized skeletal structures are unremarkable. IMPRESSION: No active cardiopulmonary disease. Electronically Signed   By: Anner Crete M.D.   On: 12/21/2018 01:40   Ct Head Wo Contrast  Result Date: 12/21/2018 CLINICAL DATA:  Headache. EXAM: CT HEAD WITHOUT CONTRAST TECHNIQUE: Contiguous axial images were obtained from the base of the skull through the vertex without intravenous contrast. COMPARISON:  December 12, 2016 FINDINGS: Brain: No evidence of acute infarction, hemorrhage, hydrocephalus, extra-axial collection or mass lesion/mass effect. Vascular: No hyperdense vessel or unexpected calcification. Skull: Normal. Negative for fracture or focal lesion. Sinuses/Orbits: There is mucosal thickening involving the left maxillary sinus and ethmoid air cells. The remaining paranasal sinuses and mastoid air cells are essentially clear. Other: None. IMPRESSION: No acute intracranial abnormality. Electronically Signed   By: Constance Holster M.D.   On: 12/21/2018 01:25    Procedures Procedures (including critical care time)  Medications Ordered in ED Medications - No data to display   Initial Impression / Assessment and Plan / ED Course  I have reviewed the triage vital signs and the nursing notes.  Pertinent labs & imaging results that were available during my care of the patient were reviewed by me and considered in my medical decision making (see chart for details).        Patient with sudden onset weakness, shortness of breath and headache.  No fever.  He does not appear to be dyspneic and has clear lungs with no hypoxia. EKG is unchanged LVH and T wave inversions.  Found to have a hemoglobin of 4.5 down from 8.5 previously.  Does have melena on exam which patient was not aware of.  He is agreeable to blood transfusion.  Will start PPI.  Worsening kidney function with creatinine of 7 from his baseline of 5 with BUN of 110.  This is likely  combination of renal failure as well as his GI bleed. Troponin 103. No CP.  Blood transfusion ordered, start IV PPI.  Gentle hydration given.  Admission discussed with Dr. Jonelle Sidle.  He request GI evaluation.  Discussed with Dr. Oletta Lamas who will see in the morning.  CRITICAL CARE Performed by: Ezequiel Essex Total critical care time: 45 minutes Critical care time was exclusive of separately billable procedures and treating other patients. Critical care was necessary to treat or prevent imminent or life-threatening deterioration. Critical care was time spent personally by me on the following activities: development of treatment plan with patient and/or surrogate as well as nursing, discussions with consultants, evaluation of patient's response to treatment, examination of patient, obtaining history from patient or surrogate, ordering and performing treatments and interventions, ordering and review of laboratory studies, ordering and review of radiographic studies, pulse oximetry and re-evaluation of patient's condition.  Final Clinical Impressions(s) / ED Diagnoses   Final diagnoses:  Symptomatic anemia  Acute renal failure superimposed on stage 5 chronic kidney disease, not on chronic dialysis, unspecified acute renal failure type [facility])    ED Discharge Orders    None       Ezequiel Essex, MD 12/21/18 [phone]

## 2018-12-21 NOTE — Progress Notes (Addendum)
Placed order for H&H at 2054. Transfusion stopped at 2004.   Paged NP Bodenheimer at 2304, post transfusion Hgb 6.8 at 2220.  Informed phlebotomist Kiki via phone at [phone] to complete patient CBC at 0515.    Notified by tele at approximately 0430 that patient had approx 12 seconds of ventricular rhythm at 0030.

## 2018-12-21 NOTE — Progress Notes (Signed)
Please see detailed H&P from this morning.  48 y.o. male with medical history significant of systolic dysfunction CHF, diabetes, hypertension, asthma, chronic kidney disease stage V not yet on hemodialysis who came to the ER with shortness of breath anf found to have critically low Hgb at 4.6 (baseline 8.8 in December). Denies any bright red blood per rectum or melena.  Patient was notably guaiac positive in the ER. Seen by GI and scheduled for EGD in am. NPO after MN. Hgb improved to 5.2 with 1 unit PRBC. 2 more units pending with diuresis between transfusions. Watch for signs of fluid overload. He has had several admissions for decompensated heart failure.  He denies any chest pain and elevated troponin likely related to demand ischemia in setting of severe anemia.

## 2018-12-21 NOTE — H&P (Signed)
History and Physical   Corey Park QMV:[id number] DOB: [birth year] DOA: 12/21/2018  Referring MD/NP/PA: Dr. Gwynne Edinger  PCP: Patient, No Pcp Per   Outpatient Specialists: Candee Furbish, MD  cardiology  Patient coming from: Home  Chief Complaint: Shortness of breath and hyperglycemia  HPI: Corey Park is a 48 y.o. 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  Patient has EF of 25 to 30%.  He has had several admissions for decompensated heart failure.  He started having some shortness of breath today felt weak had a headache and then noted his blood sugar was elevated.  Patient could not do anything at home including getting out of bed or moving around.  He could not even come down staircase at home.  He has been following up with nephrology as well as cardiology.  He said he has been taking his medications.  In the ER however patient was found to have a hemoglobin of 4.6.  Denied any bright red blood per rectum or melena.  Patient was notably guaiac positive in the ER.  He is being admitted with symptomatic anemia and acute blood loss anemia..  ED Course: Temperature is 98 for blood pressure 152/93 pulse 111 respiratory of 32 and oxygen sats 99% room air.  He has a white count of 8.0 hemoglobin 4.6 and platelets 154.  Sodium 136 potassium 4.4 chloride 108.  CO2 of 18 with a BUN of 110.  Creatinine 7.75 and calcium 7.8.  Head CT without contrast and chest x-ray were both negative.  COVID test is currently pending.  Patient has been initiated on transfusion of 2 units packed red blood cells and is being admitted to the hospital.  GI consulted by ER  Review of Systems: As per HPI otherwise 10 point review of systems negative.    Past Medical History:  Diagnosis Date   Asthma    Diabetes mellitus without complication  ([place])    Hypertension    Renal disorder     No past surgical history on file.   reports that he has never smoked. He has never used smokeless tobacco. He reports that he does not drink alcohol or use drugs.  No Known Allergies  Family History  Problem Relation Age of Onset   CAD Mother    Hypertension Mother    Diabetes Neg Hx    Stroke Neg Hx    Cancer Neg Hx    Kidney failure Neg Hx      Prior to Admission medications   Medication Sig Start Date End Date Taking? Authorizing Provider  amLODipine (NORVASC) 10 MG tablet Take 1 tablet (10 mg total) by mouth daily. 06/19/18 07/19/18  Amin, Jeanella Flattery, MD  atorvastatin (LIPITOR) 20 MG tablet Take 1 tablet (20 mg total) by mouth daily at 6 PM. 06/19/18   Amin, Jeanella Flattery, MD  carvedilol (COREG) 25 MG tablet Take 1 tablet (25 mg total) by mouth 2 (two) times daily with a meal. 06/19/18 07/19/18  Amin, Jeanella Flattery, MD  ferrous sulfate 325 (65 FE) MG tablet Take 1 tablet (325 mg total) by mouth daily. 06/19/18 07/19/18  Amin, Jeanella Flattery, MD  furosemide (LASIX) 80 MG tablet Take 1 tablet (80 mg total) by mouth 2 (two) times daily. 07/05/18   Richardson Dopp T, PA-C  hydrALAZINE (APRESOLINE) 50 MG tablet Take 1 tablet (50 mg  total) by mouth 3 (three) times daily. 07/03/18 10/01/18  Richardson Dopp T, PA-C  isosorbide mononitrate (IMDUR) 30 MG 24 hr tablet Take 1 tablet (30 mg total) by mouth daily. 07/03/18 10/01/18  Richardson Dopp T, PA-C  traZODone (DESYREL) 50 MG tablet Take 1 tablet (50 mg total) by mouth at bedtime as needed for up to 10 days for sleep. 06/19/18 07/03/18  Damita Lack, MD    Physical Exam: Vitals:   12/21/18 0137 12/21/18 0215 12/21/18 0230 12/21/18 0306  BP:    (!) 152/93  Pulse:  (!) 106 (!) 101 (!) 109  Resp: 18 15 (!) 24 16  Temp:      TempSrc:      SpO2:  99% 99% 100%  Weight:      Height:          Constitutional: Chronically ill looking Vitals:   12/21/18 0137 12/21/18 0215 12/21/18 0230 12/21/18  0306  BP:    (!) 152/93  Pulse:  (!) 106 (!) 101 (!) 109  Resp: 18 15 (!) 24 16  Temp:      TempSrc:      SpO2:  99% 99% 100%  Weight:      Height:       Eyes: PERRL, lids and conjunctivae pale ENMT: Mucous membranes are moist. Posterior pharynx clear of any exudate or lesions.Normal dentition.  Neck: normal, supple, no masses, no thyromegaly JVD to the angle of the jaw Respiratory: Diffuse basal crackles normal respiratory effort. No accessory muscle use.  Cardiovascular: Sinus tachycardia no murmurs / rubs / gallops.  2+ extremity edema. 2+ pedal pulses. No carotid bruits.  Abdomen: no tenderness, no masses palpated. No hepatosplenomegaly. Bowel sounds positive.  Musculoskeletal: no clubbing / cyanosis. No joint deformity upper and lower extremities. Good ROM, no contractures. Normal muscle tone.  Skin: no rashes, lesions, ulcers. No induration Neurologic: CN 2-12 grossly intact. Sensation intact, DTR normal. Strength 5/5 in all 4.  Psychiatric: Normal judgment and insight. Alert and oriented x 3. Normal mood.     Labs on Admission: I have personally reviewed following labs and imaging studies  CBC: Recent Labs  Lab 12/21/18 0141  WBC 8.0  NEUTROABS 6.8  HGB 4.6*  HCT 15.0*  MCV 94.3  PLT 973   Basic Metabolic Panel: Recent Labs  Lab 12/21/18 0141  NA 136  K 4.4  CL 108  CO2 18*  GLUCOSE 152*  BUN 110*  CREATININE 7.75*  CALCIUM 7.8*   GFR: Estimated Creatinine Clearance: 15.6 mL/min (A) (by C-G formula based on SCr of 7.75 mg/dL (H)). Liver Function Tests: Recent Labs  Lab 12/21/18 0141  AST 20  ALT 19  ALKPHOS 44  BILITOT 0.4  PROT 4.9*  ALBUMIN 2.3*   No results for input(s): LIPASE, AMYLASE in the last 168 hours. No results for input(s): AMMONIA in the last 168 hours. Coagulation Profile: No results for input(s): INR, PROTIME in the last 168 hours. Cardiac Enzymes: No results for input(s): CKTOTAL, CKMB, CKMBINDEX, TROPONINI in the last 168  hours. BNP (last 3 results) No results for input(s): PROBNP in the last 8760 hours. HbA1C: No results for input(s): HGBA1C in the last 72 hours. CBG: Recent Labs  Lab 12/21/18 0011  GLUCAP 154*   Lipid Profile: No results for input(s): CHOL, HDL, LDLCALC, TRIG, CHOLHDL, LDLDIRECT in the last 72 hours. Thyroid Function Tests: No results for input(s): TSH, T4TOTAL, FREET4, T3FREE, THYROIDAB in the last 72 hours. Anemia Panel: Recent Labs  12/21/18 0238  RETICCTPCT 3.9*   Urine analysis:    Component Value Date/Time   COLORURINE COLORLESS (A) 12/21/2018 0154   APPEARANCEUR CLEAR 12/21/2018 0154   LABSPEC 1.008 12/21/2018 0154   PHURINE 5.0 12/21/2018 0154   GLUCOSEU NEGATIVE 12/21/2018 0154   HGBUR NEGATIVE 12/21/2018 0154   BILIRUBINUR NEGATIVE 12/21/2018 0154   KETONESUR NEGATIVE 12/21/2018 0154   PROTEINUR 100 (A) 12/21/2018 0154   NITRITE NEGATIVE 12/21/2018 0154   LEUKOCYTESUR NEGATIVE 12/21/2018 0154   Sepsis Labs: @LABRCNTIP (procalcitonin:4,lacticidven:4) )No results found for this or any previous visit (from the past 240 hour(s)).   Radiological Exams on Admission: Dg Chest 2 View  Result Date: 12/21/2018 CLINICAL DATA:  48 year old male with shortness of breath. EXAM: CHEST - 2 VIEW COMPARISON:  Chest radiograph dated 06/16/2018 FINDINGS: The heart size and mediastinal contours are within normal limits. Both lungs are clear. The visualized skeletal structures are unremarkable. IMPRESSION: No active cardiopulmonary disease. Electronically Signed   By: Anner Crete M.D.   On: 12/21/2018 01:40   Ct Head Wo Contrast  Result Date: 12/21/2018 CLINICAL DATA:  Headache. EXAM: CT HEAD WITHOUT CONTRAST TECHNIQUE: Contiguous axial images were obtained from the base of the skull through the vertex without intravenous contrast. COMPARISON:  December 12, 2016 FINDINGS: Brain: No evidence of acute infarction, hemorrhage, hydrocephalus, extra-axial collection or mass  lesion/mass effect. Vascular: No hyperdense vessel or unexpected calcification. Skull: Normal. Negative for fracture or focal lesion. Sinuses/Orbits: There is mucosal thickening involving the left maxillary sinus and ethmoid air cells. The remaining paranasal sinuses and mastoid air cells are essentially clear. Other: None. IMPRESSION: No acute intracranial abnormality. Electronically Signed   By: Constance Holster M.D.   On: 12/21/2018 01:25    EKG: Independently reviewed.  Sinus tachycardia with a rate of 101.  Evidence of LVH, poor R wave progression, flipped T waves in the lateral leads.  No significant change from previous  Assessment/Plan Principal Problem:   Symptomatic anemia Active Problems:   DM (diabetes mellitus), type 2 (HCC)   HLD (hyperlipidemia)   Hypertensive heart disease with CHF (congestive heart failure) (HCC)   Chronic kidney disease V   Chronic systolic heart failure ([place])   Dilated cardiomyopathy ([place])     #1 symptomatic anemia: Secondary to GI bleed.  Hemoglobin 4.6 in the setting of known systolic dysfunction.  Patient will be transfused initial 2 units packed red blood cells.  Serial CBCs as well as close monitoring hemoglobin.  Diuresis in between units.  Once patient is stabilized we will need hemoglobin close to 10 g to maintain his oxygenation.  GI consulted.  #2 GI bleed: Appears to be upper GI bleed.  No use of NSAIDs.  No prior known bleed.  IV Protonix initiated and GI consulted by ER  #3 diabetes: Initiate sliding scale insulin.  Patient will be n.p.o. for the most part until seen by GI.  #4 systolic dysfunction CHF: Patient has usually decompensated CHF.  Continue home regimen at this point.  #5 hyperlipidemia: Continue with statin.  #6 chronic kidney disease stage V: Patient follows up with Dr. Marval Regal.  Has not initiated hemodialysis.  Renal function worsened from his baseline.  Nephrology consult if needed.     DVT prophylaxis: SCD Code  Status: Full code Family Communication: No family at bedside Disposition Plan: Home Consults called: ER to call GI. Admission status: Inpatient to progressive care  Severity of Illness: The appropriate patient status for this patient is INPATIENT. Inpatient status is judged  to be reasonable and necessary in order to provide the required intensity of service to ensure the patient's safety. The patient's presenting symptoms, physical exam findings, and initial radiographic and laboratory data in the context of their chronic comorbidities is felt to place them at high risk for further clinical deterioration. Furthermore, it is not anticipated that the patient will be medically stable for discharge from the hospital within 2 midnights of admission. The following factors support the patient status of inpatient.   " The patient's presenting symptoms include shortness of breath. " The worrisome physical exam findings include acute on chronically ill looking. " The initial radiographic and laboratory data are worrisome because of hemoglobin of 4.6. " The chronic co-morbidities include severe systolic dysfunction.   * I certify that at the point of admission it is my clinical judgment that the patient will require inpatient hospital care spanning beyond 2 midnights from the point of admission due to high intensity of service, high risk for further deterioration and high frequency of surveillance required.Barbette Merino MD [facility] Pager [phone]  If 7PM-7AM, please contact night-coverage [email or web address] Password [facility]  12/21/2018, 3:42 AM

## 2018-12-21 NOTE — ED Notes (Signed)
MD informed of low HGB

## 2018-12-21 NOTE — Progress Notes (Signed)
   12/21/18 1242  Clinical Encounter Type  Visited With Patient  Visit Type Initial;Other (Comment) (Corey Park)  Referral From Nurse  Consult/Referral To Chaplain  This chaplain responded to a spiritual care consult for Pt. Corey Park.  At time of the chaplain's visit, the Pt. was sleeping and did not awaken to the call of his name. The Pt. RN-Greta was in the Pt. Room with the chaplain.  The chaplain left the Corey Park document in the Pt. room and will refer the consult for F/U.  This chaplain is available for F/U as needed.

## 2018-12-21 NOTE — ED Triage Notes (Addendum)
Pt coming from home after complaints of shob, and generalized weakness that began tonight. Hx of kidney disease, HTN, DM. CBG 300 on scene, then decreased to 205 in route. Pt stated he has not been drinking much water recently, mainly juice. Pt is noncompliant with glucose checking. Sinus tach with EMS

## 2018-12-21 NOTE — H&P (View-Only) (Signed)
[facility] CONS[facility] Reason for consult: GI bleeding and anemia Referring Physician: [facility] hospitalist.  PCP: Renal team, Dr. Marlou Porch.  Primary GI: Corey Park is an 48 y.o. male.  HPI: He is followed by the renal team for severe CKD with creatinine 5.0-6.0 routinely was 7.7 on admission.  He is not yet on hemodialysis.  He is followed by Dr. Marlou Porch of cardiology for heart failure and has had several admissions for decompensated heart failure.  He apparently has ejection fraction of 25 to 30%.  He presented to the emergency room with severe shortness of breath that got worse during the day to the point he could not walk down stairs.  No cough or fever.  Labs revealed guaiac positive stool and hemoglobin of 4.6.  He has received his second unit of blood which is almost finished according to him.  His other problems include CHF with dilated cardiomyopathy, type 2 diabetes, hypertension. The patient adamantly denies abdominal pain, any use of NSAIDs, nausea or vomiting.  He is never had ulcers and is been on low-dose iron but has not had dark stools until the past couple of days.  He is never had EGD or colonoscopy.  Past Medical History:  Diagnosis Date  . Asthma   . Diabetes mellitus without complication ([place])   . Hypertension   . Renal disorder     No past surgical history on file.  Family History  Problem Relation Age of Onset  . CAD Mother   . Hypertension Mother   . Diabetes Neg Hx   . Stroke Neg Hx   . Cancer Neg Hx   . Kidney failure Neg Hx     Social History:  reports that he has never smoked. He has never used smokeless tobacco. He reports that he does not drink alcohol or use drugs.  Allergies: No Known Allergies  Medications; Prior to Admission medications   Medication Sig Start Date End Date Taking? Authorizing Provider  amLODipine (NORVASC) 10 MG tablet Take 1 tablet (10 mg total) by mouth daily. 06/19/18 07/19/18  Amin, Jeanella Flattery, MD   atorvastatin (LIPITOR) 20 MG tablet Take 1 tablet (20 mg total) by mouth daily at 6 PM. 06/19/18   Amin, Jeanella Flattery, MD  carvedilol (COREG) 25 MG tablet Take 1 tablet (25 mg total) by mouth 2 (two) times daily with a meal. 06/19/18 07/19/18  Amin, Jeanella Flattery, MD  ferrous sulfate 325 (65 FE) MG tablet Take 1 tablet (325 mg total) by mouth daily. 06/19/18 07/19/18  Amin, Jeanella Flattery, MD  furosemide (LASIX) 80 MG tablet Take 1 tablet (80 mg total) by mouth 2 (two) times daily. 07/05/18   Richardson Dopp T, PA-C  hydrALAZINE (APRESOLINE) 50 MG tablet Take 1 tablet (50 mg total) by mouth 3 (three) times daily. 07/03/18 10/01/18  Richardson Dopp T, PA-C  isosorbide mononitrate (IMDUR) 30 MG 24 hr tablet Take 1 tablet (30 mg total) by mouth daily. 07/03/18 10/01/18  Richardson Dopp T, PA-C  traZODone (DESYREL) 50 MG tablet Take 1 tablet (50 mg total) by mouth at bedtime as needed for up to 10 days for sleep. 06/19/18 07/03/18  Amin, Jeanella Flattery, MD   . insulin aspart  0-5 Units Subcutaneous QHS  . insulin aspart  0-9 Units Subcutaneous TID WC  . [START ON 12/24/2018] pantoprazole  40 mg Intravenous Q12H   PRN Meds labetalol, ondansetron **OR** ondansetron (ZOFRAN) IV Results for orders placed or performed during the hospital encounter of 12/21/18 (from the  past 48 hour(s))  CBG monitoring, ED     Status: Abnormal   Collection Time: 12/21/18 12:11 AM  Result Value Ref Range   Glucose-Capillary 154 (H) 70 - 99 mg/dL   Comment 1 Notify RN    Comment 2 Document in Chart   CBC with Differential/Platelet     Status: Abnormal   Collection Time: 12/21/18  1:41 AM  Result Value Ref Range   WBC 8.0 4.0 - 10.5 K/uL   RBC 1.59 (L) 4.22 - 5.81 MIL/uL   Hemoglobin 4.6 (LL) 13.0 - 17.0 g/dL    Comment: REPEATED TO VERIFY THIS CRITICAL RESULT HAS VERIFIED AND BEEN CALLED TO SARA GRINDSTAFF RN BY ZACH ROBINSON ON 06 27 2020 AT 0224, AND HAS BEEN READ BACK.     HCT 15.0 (L) 39.0 - 52.0 %   MCV 94.3 80.0 - 100.0 fL   MCH  28.9 26.0 - 34.0 pg   MCHC 30.7 30.0 - 36.0 g/dL   RDW 14.7 11.5 - 15.5 %   Platelets 154 150 - 400 K/uL   nRBC 0.0 0.0 - 0.2 %   Neutrophils Relative % 86 %   Neutro Abs 6.8 1.7 - 7.7 K/uL   Lymphocytes Relative 9 %   Lymphs Abs 0.7 0.7 - 4.0 K/uL   Monocytes Relative 4 %   Monocytes Absolute 0.3 0.1 - 1.0 K/uL   Eosinophils Relative 1 %   Eosinophils Absolute 0.0 0.0 - 0.5 K/uL   Basophils Relative 0 %   Basophils Absolute 0.0 0.0 - 0.1 K/uL   Immature Granulocytes 0 %   Abs Immature Granulocytes 0.03 0.00 - 0.07 K/uL    Comment: Performed at [facility] [address] [place], [place] [address]  Comprehensive metabolic panel     Status: Abnormal   Collection Time: 12/21/18  1:41 AM  Result Value Ref Range   Sodium 136 135 - 145 mmol/L   Potassium 4.4 3.5 - 5.1 mmol/L   Chloride 108 98 - 111 mmol/L   CO2 18 (L) 22 - 32 mmol/L   Glucose, Bld 152 (H) 70 - 99 mg/dL   BUN 110 (H) 6 - 20 mg/dL   Creatinine, Ser 7.75 (H) 0.61 - 1.24 mg/dL   Calcium 7.8 (L) 8.9 - 10.3 mg/dL   Total Protein 4.9 (L) 6.5 - 8.1 g/dL   Albumin 2.3 (L) 3.5 - 5.0 g/dL   AST 20 15 - 41 U/L   ALT 19 0 - 44 U/L   Alkaline Phosphatase 44 38 - 126 U/L   Total Bilirubin 0.4 0.3 - 1.2 mg/dL   GFR calc non Af Amer 8 (L) >60 mL/min   GFR calc Af Amer 9 (L) >60 mL/min   Anion gap 10 5 - 15    Comment: Performed at [facility], [place] [address] [place] [address], [place], [place] [address]  Troponin I (High Sensitivity)     Status: Abnormal   Collection Time: 12/21/18  1:41 AM  Result Value Ref Range   Troponin I (High Sensitivity) 103 (HH) <18 ng/L    Comment: CRITICAL RESULT CALLED TO, READ BACK BY AND VERIFIED WITH: GRINDSTAFF,S RN 12/21/2018 0248 JORDANS (NOTE) Elevated high sensitivity troponin I (hsTnI) values and significant  changes across serial measurements may suggest ACS but many other  chronic and acute conditions are known to elevate hsTnI results.  Refer to the Links section for chest pain  algorithms and additional  guidance. Performed at [facility], [address] [place], [place] [address]   Brain natriuretic peptide     Status: Abnormal   Collection Time: 12/21/18  1:41 AM  Result Value Ref Range   B Natriuretic Peptide 553.1 (H) 0.0 - 100.0 pg/mL    Comment: Performed at [facility] [address] [place], [place] [address]  Urinalysis, Routine w reflex microscopic     Status: Abnormal   Collection Time: 12/21/18  1:54 AM  Result Value Ref Range   Color, Urine COLORLESS (A) YELLOW   APPearance CLEAR CLEAR   Specific Gravity, Urine 1.008 1.005 - 1.030   pH 5.0 5.0 - 8.0   Glucose, UA NEGATIVE NEGATIVE mg/dL   Hgb urine dipstick NEGATIVE NEGATIVE   Bilirubin Urine NEGATIVE NEGATIVE   Ketones, ur NEGATIVE NEGATIVE mg/dL   Protein, ur 100 (A) NEGATIVE mg/dL   Nitrite NEGATIVE NEGATIVE   Leukocytes,Ua NEGATIVE NEGATIVE   RBC / HPF 0-5 0 - 5 RBC/hpf   WBC, UA 0-5 0 - 5 WBC/hpf   Bacteria, UA NONE SEEN NONE SEEN   Squamous Epithelial / LPF 0-5 0 - 5    Comment: Performed at [facility], [place] [address], [place], [place] [address]  Folate     Status: None   Collection Time: 12/21/18  2:32 AM  Result Value Ref Range   Folate 8.7 >5.9 ng/mL    Comment: Performed at [facility], [place] [address], [place], [place] [address]  Troponin I (High Sensitivity)     Status: Abnormal   Collection Time: 12/21/18  2:38 AM  Result Value Ref Range   Troponin I (High Sensitivity) 107 (HH) <18 ng/L    Comment: CRITICAL VALUE NOTED.  VALUE IS CONSISTENT WITH PREVIOUSLY REPORTED AND CALLED VALUE. (NOTE) Elevated high sensitivity troponin I (hsTnI) values and significant  changes across serial measurements may suggest ACS but many other  chronic and acute conditions are known to elevate hsTnI results.  Refer to the Links section for chest pain algorithms and additional  guidance. Performed at [facility], [place] [address] [place],  [place] [address]   Vitamin B12     Status: None   Collection Time: 12/21/18  2:38 AM  Result Value Ref Range   Vitamin B-12 188 180 - 914 pg/mL    Comment: (NOTE) This assay is not validated for testing neonatal or myeloproliferative syndrome specimens for Vitamin B12 levels. Performed at [facility], [place] [address], [place], [place] [address]   Iron and TIBC     Status: Abnormal   Collection Time: 12/21/18  2:38 AM  Result Value Ref Range   Iron 48 45 - 182 ug/dL   TIBC 295 250 - 450 ug/dL   Saturation Ratios 16 (L) 17.9 - 39.5 %   UIBC 247 ug/dL    Comment: Performed at [facility], [place] [address], [place], [place] [address]  Ferritin     Status: Abnormal   Collection Time: 12/21/18  2:38 AM  Result Value Ref Range   Ferritin 22 (L) 24 - 336 ng/mL    Comment: Performed at [facility], [place] [address] [place], [place] [address]  Reticulocytes     Status: Abnormal   Collection Time: 12/21/18  2:38 AM  Result Value Ref Range   Retic Ct Pct 3.9 (H) 0.4 - 3.1 %   RBC. 1.51 (L) 4.22 - 5.81 MIL/uL   Retic Count, Absolute 59.0 19.0 - 186.0  K/uL   Immature Retic Fract 29.3 (H) 2.3 - 15.9 %    Comment: Performed at [facility], [place] [address] [place], [place] [address]  Type and screen [place]     Status: None (Preliminary result)   Collection Time: 12/21/18  2:40 AM  Result Value Ref Range   ABO/RH(D) O POS    Antibody Screen NEG    Sample Expiration 12/24/2018,2359    Unit Number [id number]    Blood Component Type RED CELLS,LR    Unit division 00    Status of Unit ALLOCATED    Transfusion Status OK TO TRANSFUSE    Crossmatch Result Compatible    Unit Number [id number]    Blood Component Type RED CELLS,LR    Unit division 00    Status of Unit ISSUED    Transfusion Status OK TO TRANSFUSE    Crossmatch Result      Compatible Performed at [facility], [place] [address] [place], [place] [address]   ABO/Rh     Status:  None (Preliminary result)   Collection Time: 12/21/18  2:40 AM  Result Value Ref Range   ABO/RH(D)      O POS Performed at [facility] [address] [place], [place] [address]   POC occult blood, ED RN will collect     Status: Abnormal   Collection Time: 12/21/18  3:03 AM  Result Value Ref Range   Fecal Occult Bld POSITIVE (A) NEGATIVE  Prepare RBC     Status: None   Collection Time: 12/21/18  3:17 AM  Result Value Ref Range   Order Confirmation      ORDER PROCESSED BY BLOOD BANK Performed at [facility], [place] [address] [place] [address] [place], [place] [address]   SARS Coronavirus 2 (CEPHEID - Performed in [facility] hospital lab), Hosp Order     Status: None   Collection Time: 12/21/18  3:46 AM   Specimen: Nasopharyngeal Swab  Result Value Ref Range   SARS Coronavirus 2 NEGATIVE NEGATIVE    Comment: (NOTE) If result is NEGATIVE SARS-CoV-2 target nucleic acids are NOT DETECTED. The SARS-CoV-2 RNA is generally detectable in upper and lower  respiratory specimens during the acute phase of infection. The lowest  concentration of SARS-CoV-2 viral copies this assay can detect is 250  copies / mL. A negative result does not preclude SARS-CoV-2 infection  and should not be used as the sole basis for treatment or other  patient management decisions.  A negative result may occur with  improper specimen collection / handling, submission of specimen other  than nasopharyngeal swab, presence of viral mutation(s) within the  areas targeted by this assay, and inadequate number of viral copies  (<250 copies / mL). A negative result must be combined with clinical  observations, patient history, and epidemiological information. If result is POSITIVE SARS-CoV-2 target nucleic acids are DETECTED. The SARS-CoV-2 RNA is generally detectable in upper and lower  respiratory specimens dur ing the acute phase of infection.  Positive  results are indicative of active infection with SARS-CoV-2.   Clinical  correlation with patient history and other diagnostic information is  necessary to determine patient infection status.  Positive results do  not rule out bacterial infection or co-infection with other viruses. If result is PRESUMPTIVE POSTIVE SARS-CoV-2 nucleic acids MAY BE PRESENT.   A presumptive positive result was obtained on the submitted specimen  and confirmed on repeat testing.  While 2019  novel coronavirus  (SARS-CoV-2) nucleic acids may be present in the submitted sample  additional confirmatory testing may be necessary for epidemiological  and / or clinical management purposes  to differentiate between  SARS-CoV-2 and other Sarbecovirus currently known to infect humans.  If clinically indicated additional testing with an alternate test  methodology [phone]) is advised. The SARS-CoV-2 RNA is generally  detectable in upper and lower respiratory sp ecimens during the acute  phase of infection. The expected result is Negative. Fact Sheet for Patients:  StrictlyIdeas.no Fact Sheet for Healthcare Providers: BankingDealers.co.za This test is not yet approved or cleared by the [place] FDA and has been authorized for detection and/or diagnosis of SARS-CoV-2 by FDA under an Emergency Use Authorization (EUA).  This EUA will remain in effect (meaning this test can be used) for the duration of the COVID-19 declaration under Section 564(b)(1) of the Act, 21 U.S.C. section 360bbb-3(b)(1), unless the authorization is terminated or revoked sooner. Performed at [facility], [place] [address], [place], [place] [address]     Dg Chest 2 View  Result Date: 12/21/2018 CLINICAL DATA:  48 year old male with shortness of breath. EXAM: CHEST - 2 VIEW COMPARISON:  Chest radiograph dated 06/16/2018 FINDINGS: The heart size and mediastinal contours are within normal limits. Both lungs are clear. The visualized skeletal structures are  unremarkable. IMPRESSION: No active cardiopulmonary disease. Electronically Signed   By: Anner Crete M.D.   On: 12/21/2018 01:40   Ct Head Wo Contrast  Result Date: 12/21/2018 CLINICAL DATA:  Headache. EXAM: CT HEAD WITHOUT CONTRAST TECHNIQUE: Contiguous axial images were obtained from the base of the skull through the vertex without intravenous contrast. COMPARISON:  December 12, 2016 FINDINGS: Brain: No evidence of acute infarction, hemorrhage, hydrocephalus, extra-axial collection or mass lesion/mass effect. Vascular: No hyperdense vessel or unexpected calcification. Skull: Normal. Negative for fracture or focal lesion. Sinuses/Orbits: There is mucosal thickening involving the left maxillary sinus and ethmoid air cells. The remaining paranasal sinuses and mastoid air cells are essentially clear. Other: None. IMPRESSION: No acute intracranial abnormality. Electronically Signed   By: Constance Holster M.D.   On: 12/21/2018 01:25               Blood pressure 130/70, pulse (!) 105, temperature 98.6 F (37 C), temperature source Oral, resp. rate 19, height 6\' 1"  (1.854 m), weight 113.4 kg, SpO2 100 %.  Physical exam:   General--African-American male ENT--nonicteric Neck--no gross lymphadenopathy Heart--regular rate and rhythm without murmurs or gallops Lungs--few basilar rales Abdomen--nondistended, soft and completely nontender Psych--somewhat withdrawn but answers questions appropriately   Assessment: 1.  GI bleed.  Probably upper GI but no clear risk factors.  Think we need to start with EGD. 2.  CKD.  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  Not yet on dialysis 3.  Dilated cardiomyopathy with ejection fraction 30%.  History of CHF followed by Dr. Marlou Porch 4.  Hypertension  Plan: 1.  We will proceed later with EGD.  Have discussed this with patient that he will be sedated etc.  His coronavirus test is negative.  Hopefully if he has an ulcer that is not  actively bleeding we will be able to feed him and get him home in the near future.  He is agreeable to proceed and we will get him on the schedule later today.   Nancy Fetter 12/21/2018, 6:58 AM   This note was created using voice recognition software and minor errors may Have  occurred unintentionally. Pager: [phone] If no answer or after hours call [phone]

## 2018-12-21 NOTE — ED Notes (Signed)
Patient transported to CT 

## 2018-12-22 ENCOUNTER — Inpatient Hospital Stay (HOSPITAL_COMMUNITY): Payer: Medicaid Other | Admitting: Certified Registered Nurse Anesthetist

## 2018-12-22 ENCOUNTER — Encounter (HOSPITAL_COMMUNITY): Payer: Self-pay | Admitting: *Deleted

## 2018-12-22 ENCOUNTER — Encounter (HOSPITAL_COMMUNITY)
Admission: EM | Disposition: A | Discharge status: Home / Self Care | Payer: Self-pay | Source: Home / Self Care | Attending: Nephrology

## 2018-12-22 DIAGNOSIS — I5022 Chronic systolic (congestive) heart failure: Secondary | ICD-10-CM

## 2018-12-22 DIAGNOSIS — N184 Chronic kidney disease, stage 4 (severe): Secondary | ICD-10-CM

## 2018-12-22 DIAGNOSIS — I110 Hypertensive heart disease with heart failure: Secondary | ICD-10-CM

## 2018-12-22 DIAGNOSIS — R809 Proteinuria, unspecified: Secondary | ICD-10-CM

## 2018-12-22 DIAGNOSIS — I5042 Chronic combined systolic (congestive) and diastolic (congestive) heart failure: Secondary | ICD-10-CM

## 2018-12-22 DIAGNOSIS — I420 Dilated cardiomyopathy: Secondary | ICD-10-CM

## 2018-12-22 DIAGNOSIS — E1129 Type 2 diabetes mellitus with other diabetic kidney complication: Secondary | ICD-10-CM

## 2018-12-22 HISTORY — PX: BIOPSY: SHX5522

## 2018-12-22 HISTORY — PX: ESOPHAGOGASTRODUODENOSCOPY: SHX5428

## 2018-12-22 LAB — CBC WITH DIFFERENTIAL/PLATELET
Abs Immature Granulocytes: 0.04 10*3/uL (ref 0.00–0.07)
Abs Immature Granulocytes: 0.03 10*3/uL (ref 0.00–0.07)
Abs Immature Granulocytes: 0.04 10*3/uL (ref 0.00–0.07)
Basophils Absolute: 0 10*3/uL (ref 0.0–0.1)
Basophils Absolute: 0 10*3/uL (ref 0.0–0.1)
Basophils Absolute: 0 10*3/uL (ref 0.0–0.1)
Basophils Relative: 0 %
Basophils Relative: 0 %
Basophils Relative: 0 %
Eosinophils Absolute: 0.3 10*3/uL (ref 0.0–0.5)
Eosinophils Absolute: 0.3 10*3/uL (ref 0.0–0.5)
Eosinophils Absolute: 0.3 10*3/uL (ref 0.0–0.5)
Eosinophils Relative: 3 %
Eosinophils Relative: 3 %
Eosinophils Relative: 3 %
HCT: 21.5 % — ABNORMAL LOW (ref 39.0–52.0)
HCT: 22.9 % — ABNORMAL LOW (ref 39.0–52.0)
HCT: 23.5 % — ABNORMAL LOW (ref 39.0–52.0)
Hemoglobin: 7.3 g/dL — ABNORMAL LOW (ref 13.0–17.0)
Hemoglobin: 7.6 g/dL — ABNORMAL LOW (ref 13.0–17.0)
Hemoglobin: 7.7 g/dL — ABNORMAL LOW (ref 13.0–17.0)
Immature Granulocytes: 1 %
Immature Granulocytes: 0 %
Immature Granulocytes: 1 %
Lymphocytes Relative: 15 %
Lymphocytes Relative: 13 %
Lymphocytes Relative: 13 %
Lymphs Abs: 1.3 10*3/uL (ref 0.7–4.0)
Lymphs Abs: 1.1 10*3/uL (ref 0.7–4.0)
Lymphs Abs: 1.2 10*3/uL (ref 0.7–4.0)
MCH: 30.3 pg (ref 26.0–34.0)
MCH: 29.8 pg (ref 26.0–34.0)
MCH: 29.6 pg (ref 26.0–34.0)
MCHC: 34 g/dL (ref 30.0–36.0)
MCHC: 33.2 g/dL (ref 30.0–36.0)
MCHC: 32.8 g/dL (ref 30.0–36.0)
MCV: 89.2 fL (ref 80.0–100.0)
MCV: 89.8 fL (ref 80.0–100.0)
MCV: 90.4 fL (ref 80.0–100.0)
Monocytes Absolute: 0.7 10*3/uL (ref 0.1–1.0)
Monocytes Absolute: 0.7 10*3/uL (ref 0.1–1.0)
Monocytes Absolute: 0.6 10*3/uL (ref 0.1–1.0)
Monocytes Relative: 8 %
Monocytes Relative: 8 %
Monocytes Relative: 7 %
Neutro Abs: 6.3 10*3/uL (ref 1.7–7.7)
Neutro Abs: 6.9 10*3/uL (ref 1.7–7.7)
Neutro Abs: 6.6 10*3/uL (ref 1.7–7.7)
Neutrophils Relative %: 73 %
Neutrophils Relative %: 76 %
Neutrophils Relative %: 76 %
Platelets: 130 10*3/uL — ABNORMAL LOW (ref 150–400)
Platelets: 136 10*3/uL — ABNORMAL LOW (ref 150–400)
Platelets: 137 10*3/uL — ABNORMAL LOW (ref 150–400)
RBC: 2.41 MIL/uL — ABNORMAL LOW (ref 4.22–5.81)
RBC: 2.55 MIL/uL — ABNORMAL LOW (ref 4.22–5.81)
RBC: 2.6 MIL/uL — ABNORMAL LOW (ref 4.22–5.81)
RDW: 15.3 % (ref 11.5–15.5)
RDW: 15.4 % (ref 11.5–15.5)
RDW: 15.6 % — ABNORMAL HIGH (ref 11.5–15.5)
WBC: 8.5 10*3/uL (ref 4.0–10.5)
WBC: 9 10*3/uL (ref 4.0–10.5)
WBC: 8.7 10*3/uL (ref 4.0–10.5)
nRBC: 0 % (ref 0.0–0.2)
nRBC: 0 % (ref 0.0–0.2)
nRBC: 0 % (ref 0.0–0.2)

## 2018-12-22 LAB — GLUCOSE, CAPILLARY
Glucose-Capillary: 103 mg/dL — ABNORMAL HIGH (ref 70–99)
Glucose-Capillary: 109 mg/dL — ABNORMAL HIGH (ref 70–99)
Glucose-Capillary: 157 mg/dL — ABNORMAL HIGH (ref 70–99)
Glucose-Capillary: 129 mg/dL — ABNORMAL HIGH (ref 70–99)

## 2018-12-22 SURGERY — EGD (ESOPHAGOGASTRODUODENOSCOPY)
Anesthesia: Monitor Anesthesia Care

## 2018-12-22 MED ORDER — HYDRALAZINE HCL 50 MG PO TABS
50.0000 mg | ORAL_TABLET | Freq: Three times a day (TID) | ORAL | Status: DC
  Administered 2018-12-22 – 2018-12-24 (×7): 50 mg via ORAL
  Filled 2018-12-22 (×7): qty 1

## 2018-12-22 MED ORDER — SODIUM CHLORIDE 0.9 % IV SOLN: INTRAVENOUS | Status: DC

## 2018-12-22 MED ORDER — FUROSEMIDE 80 MG PO TABS
80.0000 mg | ORAL_TABLET | Freq: Two times a day (BID) | ORAL | Status: DC
  Administered 2018-12-22 – 2018-12-24 (×5): 80 mg via ORAL
  Filled 2018-12-22 (×5): qty 1

## 2018-12-22 MED ORDER — SODIUM CHLORIDE 0.9 % IV SOLN
510.0000 mg | Freq: Once | INTRAVENOUS | Status: AC
  Administered 2018-12-23: 510 mg via INTRAVENOUS
  Filled 2018-12-22: qty 17

## 2018-12-22 MED ORDER — PROPOFOL 10 MG/ML IV BOLUS
INTRAVENOUS | Status: DC | PRN
  Administered 2018-12-22 (×2): 20 mg via INTRAVENOUS

## 2018-12-22 MED ORDER — SODIUM CHLORIDE 0.9 % IV SOLN
INTRAVENOUS | Status: DC | PRN
  Administered 2018-12-22: 13:00:00 via INTRAVENOUS

## 2018-12-22 MED ORDER — CARVEDILOL 25 MG PO TABS
25.0000 mg | ORAL_TABLET | Freq: Two times a day (BID) | ORAL | Status: DC
  Administered 2018-12-22 – 2018-12-24 (×5): 25 mg via ORAL
  Filled 2018-12-22 (×5): qty 1

## 2018-12-22 MED ORDER — PROPOFOL 500 MG/50ML IV EMUL
INTRAVENOUS | Status: DC | PRN
  Administered 2018-12-22: 100 ug/kg/min via INTRAVENOUS

## 2018-12-22 NOTE — Interval H&P Note (Signed)
History and Physical Interval Note:  12/22/2018 1:05 PM  Corey Park  has presented today for surgery, with the diagnosis of GI bleed.  The various methods of treatment have been discussed with the patient and family. After consideration of risks, benefits and other options for treatment, the patient has consented to  Procedure(s): ESOPHAGOGASTRODUODENOSCOPY (EGD) (N/A) as a surgical intervention.  The patient's history has been reviewed, patient examined, no change in status, stable for surgery.  I have reviewed the patient's chart and labs.  Questions were answered to the patient's satisfaction.     Nancy Fetter

## 2018-12-22 NOTE — Progress Notes (Signed)
Patient's mother was updated after EGD.

## 2018-12-22 NOTE — Progress Notes (Signed)
Patient upset because he didn't have the procedure done yet, because the MD told his yesterday that the procedure will be around 10:30 o'clock. Writer explained to him and his mother that the procedure can be delayed if there will be an emergency in Endo and told them that I will call Endo and asked if they have a time for him. Per Endo, patient will be go for the procedure in the next 30 minutes.Patient'and his mother notified.

## 2018-12-22 NOTE — Op Note (Signed)
[facility] Patient Name: Corey Park Procedure Date : 12/22/2018 MRN: [id number] Attending MD: Nancy Fetter Dr., MD Date of Birth: 10/29/1970 CSN: [id number] Age: 48 Admit Type: Inpatient Procedure:                Upper GI endoscopy Indications:              Iron deficiency anemia secondary to chronic blood                            loss Providers:                Jeneen Rinks L. Amardeep Beckers Dr., MD, Carlyn Reichert, RN,                            William Dalton, Technician Referring MD:              Medicines:                Monitored Anesthesia Care Complications:            No immediate complications. Estimated Blood Loss:     Estimated blood loss: none. Procedure:                Pre-Anesthesia Assessment:                           - Prior to the procedure, a History and Physical                            was performed, and patient medications and                            allergies were reviewed. The patient's tolerance of                            previous anesthesia was also reviewed. The risks                            and benefits of the procedure and the sedation                            options and risks were discussed with the patient.                            All questions were answered, and informed consent                            was obtained. Prior Anticoagulants: The patient has                            taken no previous anticoagulant or antiplatelet                            agents. ASA Grade Assessment: IV - A patient with  severe systemic disease that is a constant threat                            to life. After reviewing the risks and benefits,                            the patient was deemed in satisfactory condition to                            undergo the procedure.                           After obtaining informed consent, the endoscope was                            passed under direct vision. Throughout  the                            procedure, the patient's blood pressure, pulse, and                            oxygen saturations were monitored continuously. The                            [id number]-[id number] ([id number]) Olympus gastroscope was                            introduced through the mouth, and advanced to the                            second part of duodenum. The upper GI endoscopy was                            accomplished without difficulty. The patient                            tolerated the procedure well. Scope In: Scope Out: Findings:      The esophagus was normal.      One non-bleeding superficial gastric ulcer with no stigmata of bleeding       was found at the incisura. The lesion was 5 mm in largest dimension.       Biopsies were taken with a cold forceps for histology. The pathology       specimen was placed into Bottle Number 2.      Multiple 2 to 5 mm sessile polyps were found in the duodenal bulb.       Biopsies were taken with a cold forceps for histology. The pathology       specimen was placed into Bottle Number 1.      The exam was otherwise without abnormality. Impression:               - Normal esophagus.                           - Non-bleeding gastric ulcer with no stigmata of  bleeding. Biopsied.                           - Multiple duodenal polyps. Biopsied.                           - The examination was otherwise normal. Recommendation:           - Return patient to hospital ward for ongoing care.                           - Continue present medications.                           - Cardiac diet. Procedure Code(s):        --- Professional ---                           [phone], Esophagogastroduodenoscopy, flexible,                            transoral; with biopsy, single or multiple Diagnosis Code(s):        --- Professional ---                           K25.9, Gastric ulcer, unspecified as acute or                            chronic,  without hemorrhage or perforation                           K31.7, Polyp of stomach and duodenum                           D50.0, Iron deficiency anemia secondary to blood                            loss (chronic) CPT copyright 2019 [employer]. All rights reserved. The codes documented in this report are preliminary and upon coder review may  be revised to meet current compliance requirements. Nancy Fetter Dr., MD 12/22/2018 1:39:04 PM This report has been signed electronically. Number of Addenda: 0

## 2018-12-22 NOTE — Progress Notes (Signed)
PROGRESS NOTE    Corey Park  HEN:[id number] DOB: 09/03/1970 DOA: 12/21/2018 PCP: Patient, No Pcp Per    Brief Narrative:  48 year old male who presented with dyspnea and hyperglycemia.  He does have significant past medical history for systolic heart failure, type 2 diabetes mellitus, hypertension, asthma and chronic kidney disease stage V.  Patient developed acute worsening dyspnea on exertion, symptoms with minimal efforts, on his initial physical examination his temperature was 98, blood pressure 152/93, heart rate 111, respiratory rate 32, oxygen saturation 99%, moist mucous membranes, positive JVD, his lungs had diffuse bilateral rails, heart S1-S2 present, tachycardic, abdomen was soft, 2+ lower extremity edema.  Sodium 136, potassium 4.4, chloride 108, bicarb 18, glucose 152, BUN 110, creatinine 7.7, troponin I 103, white count 8.0, hemoglobin 4.6, hematocrit 15, platelets 154.   Patient was admitted to the hospital with a working diagnosis of symptomatic anemia.  Assessment & Plan:   Principal Problem:   Symptomatic anemia Active Problems:   DM (diabetes mellitus), type 2 (HCC)   HLD (hyperlipidemia)   Hypertensive heart disease with CHF (congestive heart failure) (HCC)   Chronic kidney disease V   Chronic systolic heart failure ([place])   Dilated cardiomyopathy ([place])   1. Acute upper GI bleed, with acute blood loss anemia. Patient is sp upper endoscopy, findings non bleeding gastric ulcers, with no stigmata of bleeding. Patient is sp 3 units prbc transfusion with Hgb at 7,6. His symptoms have improved but not yet back to baseline. Will continue pantoprazole bid, advance diet as tolerated and follow cell count in am.  Iron panel consistent with anemia of chronic disease combined with iron deficiency, patient will benefit of IV iron before discharge.   2. T2DM. Will continue glucose cover and monitoring with insulin sliding scale, patient will have his diet advance today.   3.  Systolic heart failure. Clinically euvolemic, will continue blood pressure control. Will resume carvedilol, and hydralazine.   4. CKD stage V. Will continue close follow up on renal function and electrolytes. Serum Cr stable at 7,16 with K at 4,8 and serum bicarbonate at 19. Will follow on renal panel in am. Resume furosemide 80 mg bid  5, Dyslipidemia. Continue statin therapy.   6. Obesity. Calculated BMI is 32,9, will continue follow up as outpatient   DVT prophylaxis: scd   Code Status:  full Family Communication: no family at the bedside  Disposition Plan/ discharge barriers: pending h&H check in am possible dc 12/23/18  Body mass index is 32.98 kg/m. Malnutrition Type:      Malnutrition Characteristics:      Nutrition Interventions:     RN Pressure Injury Documentation:     Consultants:   GI   Procedures:   Upper endoscopy   Antimicrobials:       Subjective: Patient with no dyspnea or chest pain, sp egd, no nausea or vomiting, Hgb trending up post prbc transfusion.   Objective: Vitals:   12/22/18 0407 12/22/18 0757 12/22/18 1127 12/22/18 1301  BP: 127/73 (!) 154/99 (!) 142/78 (!) 192/105  Pulse: 84  88   Resp: 15 15 20 15   Temp: 98.5 F (36.9 C) 97.8 F (36.6 C) 98.6 F (37 C) 97.8 F (36.6 C)  TempSrc: Oral Oral Oral Oral  SpO2: 100% 100% 100% 100%  Weight:    113.4 kg  Height:    6\' 1"  (1.854 m)    Intake/Output Summary (Last 24 hours) at 12/22/2018 1323 Last data filed at 12/22/2018 0853 Gross per 24  hour  Intake 1663.67 ml  Output 1650 ml  Net 13.67 ml   Filed Weights   12/21/18 0012 12/22/18 1301  Weight: 113.4 kg 113.4 kg    Examination:   General: Not in pain or dyspnea  Neurology: Awake and alert, non focal  E ENT: mild pallor, no icterus, oral mucosa moist Cardiovascular: No JVD. S1-S2 present, rhythmic, no gallops, rubs, or murmurs. No lower extremity edema. Pulmonary: positive breath sounds bilaterally, adequate air  movement, no wheezing, rhonchi or rales. Gastrointestinal. Abdomen with no organomegaly, non tender, no rebound or guarding Skin. No rashes Musculoskeletal: no joint deformities     Data Reviewed: I have personally reviewed following labs and imaging studies  CBC: Recent Labs  Lab 12/21/18 0650 12/21/18 1034 12/21/18 2220 12/22/18 0603 12/22/18 1155  WBC 8.2 8.1 7.6 8.5 9.0  NEUTROABS 6.3 6.1 5.4 6.3 6.9  HGB 4.9* 5.2* 6.8* 7.3* 7.6*  HCT 15.5* 15.8* 20.0* 21.5* 22.9*  MCV 94.5 92.4 88.5 89.2 89.8  PLT 136* 151 143* 130* 660*   Basic Metabolic Panel: Recent Labs  Lab 12/21/18 0141 12/21/18 0650  NA 136 140  K 4.4 4.8  CL 108 109  CO2 18* 19*  GLUCOSE 152* 134*  BUN 110* 114*  CREATININE 7.75* 7.16*  CALCIUM 7.8* 7.9*   GFR: Estimated Creatinine Clearance: 16.8 mL/min (A) (by C-G formula based on SCr of 7.16 mg/dL (H)). Liver Function Tests: Recent Labs  Lab 12/21/18 0141 12/21/18 0650  AST 20 17  ALT 19 17  ALKPHOS 44 42  BILITOT 0.4 0.5  PROT 4.9* 4.7*  ALBUMIN 2.3* 2.2*   No results for input(s): LIPASE, AMYLASE in the last 168 hours. No results for input(s): AMMONIA in the last 168 hours. Coagulation Profile: No results for input(s): INR, PROTIME in the last 168 hours. Cardiac Enzymes: No results for input(s): CKTOTAL, CKMB, CKMBINDEX, TROPONINI in the last 168 hours. BNP (last 3 results) No results for input(s): PROBNP in the last 8760 hours. HbA1C: Recent Labs    12/21/18 1034  HGBA1C 6.0*   CBG: Recent Labs  Lab 12/21/18 1150 12/21/18 1647 12/21/18 2115 12/22/18 0810 12/22/18 1149  GLUCAP 172* 137* 161* 103* 109*   Lipid Profile: No results for input(s): CHOL, HDL, LDLCALC, TRIG, CHOLHDL, LDLDIRECT in the last 72 hours. Thyroid Function Tests: No results for input(s): TSH, T4TOTAL, FREET4, T3FREE, THYROIDAB in the last 72 hours. Anemia Panel: Recent Labs    12/21/18 0232 12/21/18 0238  VITAMINB12  --  188  FOLATE 8.7  --    FERRITIN  --  22*  TIBC  --  295  IRON  --  48  RETICCTPCT  --  3.9*      Radiology Studies: I have reviewed all of the imaging during this hospital visit personally     Scheduled Meds: . [MAR Hold] sodium chloride   Intravenous Once  . [MAR Hold] insulin aspart  0-5 Units Subcutaneous QHS  . [MAR Hold] insulin aspart  0-9 Units Subcutaneous TID WC  . [MAR Hold] pantoprazole  40 mg Intravenous Q12H   Continuous Infusions: . sodium chloride    . pantoprozole (PROTONIX) infusion 8 mg/hr (12/22/18 0302)     LOS: 1 day        Kynsli Haapala Gerome Apley, MD

## 2018-12-22 NOTE — Anesthesia Preprocedure Evaluation (Signed)
Anesthesia Evaluation  Patient identified by MRN, date of birth, ID band Patient awake    Reviewed: Allergy & Precautions, NPO status , Patient's Chart, lab work & pertinent test results, reviewed documented beta blocker date and time   Airway Mallampati: II  TM Distance: >3 FB Neck ROM: Full    Dental  (+) Dental Advisory Given   Pulmonary asthma ,    Pulmonary exam normal breath sounds clear to auscultation       Cardiovascular hypertension, Pt. on home beta blockers and Pt. on medications +CHF  Normal cardiovascular exam Rhythm:Regular Rate:Normal  Echo 06/17/2018 - Left ventricle: The cavity size was moderately dilated. Wall thickness was increased in a pattern of moderate LVH. Systolic function was severely reduced. The estimated ejection fraction was in the range of 25% to 30%. Diffuse hypokinesis. Doppler parameters are consistent with both elevated ventricular end-diastolic filling pressure and elevated left atrial filling pressure. - Mitral valve: There was mild regurgitation. - Left atrium: The atrium was moderately dilated. - Atrial septum: No defect or patent foramen ovale was identified. - Pulmonary arteries: PA peak pressure: 36 mm Hg (S).   Neuro/Psych negative neurological ROS  negative psych ROS   GI/Hepatic negative GI ROS, Neg liver ROS,   Endo/Other  negative endocrine ROSdiabetes  Renal/GU Renal disease     Musculoskeletal negative musculoskeletal ROS (+)   Abdominal   Peds  Hematology negative hematology ROS (+) anemia ,   Anesthesia Other Findings   Reproductive/Obstetrics                             Anesthesia Physical Anesthesia Plan  ASA: IV  Anesthesia Plan: MAC   Post-op Pain Management:    Induction: Intravenous  PONV Risk Score and Plan: 1 and Propofol infusion and TIVA  Airway Management Planned:   Additional Equipment: None  Intra-op Plan:    Post-operative Plan:   Informed Consent: I have reviewed the patients History and Physical, chart, labs and discussed the procedure including the risks, benefits and alternatives for the proposed anesthesia with the patient or authorized representative who has indicated his/her understanding and acceptance.     Dental advisory given  Plan Discussed with: CRNA  Anesthesia Plan Comments:         Anesthesia Quick Evaluation

## 2018-12-22 NOTE — Progress Notes (Signed)
Patient's mother Henrine Screws was called and updated on her son. All questions were answered. Will continue to monitor.

## 2018-12-22 NOTE — Anesthesia Postprocedure Evaluation (Signed)
Anesthesia Post Note  Patient: Corey Park  Procedure(s) Performed: ESOPHAGOGASTRODUODENOSCOPY (EGD) (N/A ) BIOPSY     Patient location during evaluation: PACU Anesthesia Type: MAC Level of consciousness: awake and alert Pain management: pain level controlled Vital Signs Assessment: post-procedure vital signs reviewed and stable Respiratory status: spontaneous breathing Cardiovascular status: stable Anesthetic complications: no    Last Vitals:  Vitals:   12/22/18 1330 12/22/18 1342  BP: 119/65 138/81  Pulse:    Resp: (!) 21 (!) 25  Temp: 36.6 C   SpO2: 100% 100%    Last Pain:  Vitals:   12/22/18 1342  TempSrc:   PainSc: 0-No pain                 Nolon Nations

## 2018-12-22 NOTE — Transfer of Care (Signed)
Immediate Anesthesia Transfer of Care Note  Patient: Corey Park  Procedure(s) Performed: ESOPHAGOGASTRODUODENOSCOPY (EGD) (N/A ) BIOPSY  Patient Location: Endoscopy Unit  Anesthesia Type:MAC  Level of Consciousness: awake  Airway & Oxygen Therapy: Patient Spontanous Breathing and Patient connected to nasal cannula oxygen  Post-op Assessment: Report given to RN and Post -op Vital signs reviewed and stable  Post vital signs: Reviewed and stable  Last Vitals:  Vitals Value Taken Time  BP    Temp    Pulse    Resp    SpO2      Last Pain:  Vitals:   12/22/18 1301  TempSrc: Oral  PainSc: 0-No pain         Complications: No apparent anesthesia complications

## 2018-12-22 NOTE — Progress Notes (Signed)
Patient going to Endo.  

## 2018-12-23 DIAGNOSIS — N186 End stage renal disease: Secondary | ICD-10-CM

## 2018-12-23 DIAGNOSIS — N189 Chronic kidney disease, unspecified: Secondary | ICD-10-CM | POA: Diagnosis present

## 2018-12-23 DIAGNOSIS — D509 Iron deficiency anemia, unspecified: Secondary | ICD-10-CM | POA: Diagnosis present

## 2018-12-23 DIAGNOSIS — N179 Acute kidney failure, unspecified: Secondary | ICD-10-CM

## 2018-12-23 DIAGNOSIS — N185 Chronic kidney disease, stage 5: Secondary | ICD-10-CM

## 2018-12-23 DIAGNOSIS — Z992 Dependence on renal dialysis: Secondary | ICD-10-CM

## 2018-12-23 DIAGNOSIS — D631 Anemia in chronic kidney disease: Secondary | ICD-10-CM | POA: Diagnosis present

## 2018-12-23 HISTORY — DX: Iron deficiency anemia, unspecified: D50.9

## 2018-12-23 LAB — CBC WITH DIFFERENTIAL/PLATELET
Abs Immature Granulocytes: 0.02 10*3/uL (ref 0.00–0.07)
Basophils Absolute: 0 10*3/uL (ref 0.0–0.1)
Basophils Relative: 0 %
Eosinophils Absolute: 0.2 10*3/uL (ref 0.0–0.5)
Eosinophils Relative: 3 %
HCT: 20.3 % — ABNORMAL LOW (ref 39.0–52.0)
Hemoglobin: 6.7 g/dL — CL (ref 13.0–17.0)
Immature Granulocytes: 0 %
Lymphocytes Relative: 12 %
Lymphs Abs: 0.8 10*3/uL (ref 0.7–4.0)
MCH: 30.2 pg (ref 26.0–34.0)
MCHC: 33 g/dL (ref 30.0–36.0)
MCV: 91.4 fL (ref 80.0–100.0)
Monocytes Absolute: 0.6 10*3/uL (ref 0.1–1.0)
Monocytes Relative: 8 %
Neutro Abs: 5.1 10*3/uL (ref 1.7–7.7)
Neutrophils Relative %: 77 %
Platelets: 114 10*3/uL — ABNORMAL LOW (ref 150–400)
RBC: 2.22 MIL/uL — ABNORMAL LOW (ref 4.22–5.81)
RDW: 15.4 % (ref 11.5–15.5)
WBC: 6.7 10*3/uL (ref 4.0–10.5)
nRBC: 0 % (ref 0.0–0.2)

## 2018-12-23 LAB — BASIC METABOLIC PANEL
Anion gap: 7 (ref 5–15)
BUN: 102 mg/dL — ABNORMAL HIGH (ref 6–20)
CO2: 20 mmol/L — ABNORMAL LOW (ref 22–32)
Calcium: 7.9 mg/dL — ABNORMAL LOW (ref 8.9–10.3)
Chloride: 112 mmol/L — ABNORMAL HIGH (ref 98–111)
Creatinine, Ser: 8.05 mg/dL — ABNORMAL HIGH (ref 0.61–1.24)
GFR calc Af Amer: 8 mL/min — ABNORMAL LOW (ref >60)
GFR calc non Af Amer: 7 mL/min — ABNORMAL LOW (ref >60)
Glucose, Bld: 179 mg/dL — ABNORMAL HIGH (ref 70–99)
Potassium: 4.7 mmol/L (ref 3.5–5.1)
Sodium: 139 mmol/L (ref 135–145)

## 2018-12-23 LAB — HEMOGLOBIN AND HEMATOCRIT, BLOOD
HCT: 23.8 % — ABNORMAL LOW (ref 39.0–52.0)
Hemoglobin: 7.9 g/dL — ABNORMAL LOW (ref 13.0–17.0)

## 2018-12-23 LAB — GLUCOSE, CAPILLARY
Glucose-Capillary: 113 mg/dL — ABNORMAL HIGH (ref 70–99)
Glucose-Capillary: 143 mg/dL — ABNORMAL HIGH (ref 70–99)
Glucose-Capillary: 111 mg/dL — ABNORMAL HIGH (ref 70–99)
Glucose-Capillary: 124 mg/dL — ABNORMAL HIGH (ref 70–99)

## 2018-12-23 LAB — PREPARE RBC (CROSSMATCH)

## 2018-12-23 MED ORDER — PROMETHAZINE HCL 25 MG/ML IJ SOLN: 12.5000 mg | Freq: Three times a day (TID) | INTRAMUSCULAR | Status: DC | PRN

## 2018-12-23 MED ORDER — ACETAMINOPHEN 325 MG PO TABS
650.0000 mg | ORAL_TABLET | Freq: Four times a day (QID) | ORAL | Status: DC | PRN
  Filled 2018-12-23: qty 2

## 2018-12-23 MED ORDER — SODIUM CHLORIDE 0.9% IV SOLUTION: Freq: Once | INTRAVENOUS | Status: DC

## 2018-12-23 MED ORDER — HYDROCODONE-ACETAMINOPHEN 5-325 MG PO TABS
1.0000 | ORAL_TABLET | Freq: Four times a day (QID) | ORAL | Status: DC | PRN
  Administered 2018-12-23 (×2): 2 via ORAL
  Administered 2018-12-23: 1 via ORAL
  Filled 2018-12-23: qty 1
  Filled 2018-12-23 (×2): qty 2

## 2018-12-23 NOTE — Progress Notes (Signed)
[facility] Progress Note  Corey Park 48 y.o. 1970-08-13  CC: GI bleed   Subjective: Patient sitting comfortably in the bed eating breakfast.  Receiving blood transfusion.  Denies black tarry stool or bright red blood per rectum.  Denies abdominal pain, nausea and vomiting.  ROS : negative for chest pain and shortness of breath.   Objective: Vital signs in last 24 hours: Vitals:   12/23/18 0541 12/23/18 0748  BP: 129/78   Pulse: 72   Resp: 14   Temp: 98 F (36.7 C) 98.5 F (36.9 C)  SpO2: 100%     Physical Exam:  General:  Alert, cooperative, no distress, appears stated age  Head:  Normocephalic, without obvious abnormality, atraumatic  Eyes:  , EOM's intact,   Lungs:   Clear to auscultation bilaterally, respirations unlabored  Heart:  Regular rate and rhythm, S1, S2 normal  Abdomen:   Soft, non-tender, bowel sounds+, ND, no peritoneal signs  Extremities: Extremities normal, atraumatic, no  edema  Pulses: 2+ and symmetric    Lab Results: Recent Labs    12/21/18 0650 12/23/18 0238  NA 140 139  K 4.8 4.7  CL 109 112*  CO2 19* 20*  GLUCOSE 134* 179*  BUN 114* 102*  CREATININE 7.16* 8.05*  CALCIUM 7.9* 7.9*   Recent Labs    12/21/18 0141 12/21/18 0650  AST 20 17  ALT 19 17  ALKPHOS 44 42  BILITOT 0.4 0.5  PROT 4.9* 4.7*  ALBUMIN 2.3* 2.2*   Recent Labs    12/22/18 1638 12/23/18 0238  WBC 8.7 6.7  NEUTROABS 6.6 5.1  HGB 7.7* 6.7*  HCT 23.5* 20.3*  MCV 90.4 91.4  PLT 137* 114*   No results for input(s): LABPROT, INR in the last 72 hours.    Assessment/Plan: -GI bleed.  EGD yesterday showed tiny gastric ulcer as well as few polyps.  Biopsies pending. -Chronic kidney disease -Dilated cardiomyopathy with EF of 30%.  Recommendations -------------------------- -No further bleeding episodes.  Drop in hemoglobin this morning could be dilutional as all cell lines have dropped. -Currently receiving blood transfusion. -Recheck H&H  in the morning.  Follow biopsies. -GI will follow   Otis Brace MD, [place] 12/23/2018, 8:07 AM  Contact #  [phone]

## 2018-12-23 NOTE — Progress Notes (Signed)
Paged NP Bodenheimer at [employer]. Patient complaining new onset toothache in left upper. 10/10 pain.PRN Vicodin ordered/given.   Notified by tele at [place] patient had seven beat run VTach with wide QRS. RN to patient bedside. Patient asleep, resting comfortably, no distress. Will continue to monitor. Paged NP Bodenheimer at [phone].   During blood transfusion patient inquired where was blood going (given blood loss and need for transfusion). RN informed patient unsure of origin of blood loss as EGD indicated non-bleeding ulcer, no bloody stools since inpatient and no blood in urine. Informed patient care team will further assess and evaluate to try to determine origin. Informed day RN of patient's inquiry.

## 2018-12-23 NOTE — Progress Notes (Signed)
Patient complaining of nausea and "not feeling well". Patients blood pressure  154/95. MD notified, orders placed for phenergan. Patient refuses phenergan at this time. Will continue to monitor.

## 2018-12-23 NOTE — Progress Notes (Signed)
PROGRESS NOTE    Corey Park  TSV:[id number] DOB: [birth year] DOA: 12/21/2018 PCP: Patient, No Pcp Per    Brief Narrative:  48 year old male who presented with dyspnea and hyperglycemia.  He does have significant past medical history for systolic heart failure, type 2 diabetes mellitus, hypertension, asthma and chronic kidney disease stage V.  Patient developed acute worsening dyspnea on exertion, symptoms with minimal efforts, on his initial physical examination his temperature was 98, blood pressure 152/93, heart rate 111, respiratory rate 32, oxygen saturation 99%, moist mucous membranes, positive JVD, his lungs had diffuse bilateral rails, heart S1-S2 present, tachycardic, abdomen was soft, 2+ lower extremity edema.  Sodium 136, potassium 4.4, chloride 108, bicarb 18, glucose 152, BUN 110, creatinine 7.7, troponin I 103, white count 8.0, hemoglobin 4.6, hematocrit 15, platelets 154.   Patient was admitted to the hospital with a working diagnosis of symptomatic anemia.  Assessment & Plan:   Principal Problem:   Symptomatic anemia Active Problems:   DM (diabetes mellitus), type 2 (HCC)   HLD (hyperlipidemia)   Hypertensive heart disease with CHF (congestive heart failure) (HCC)   Chronic kidney disease V   Chronic systolic heart failure ([place])   Dilated cardiomyopathy ([place])   1. Acute upper GI bleed, with acute blood loss anemia. Patient is sp upper endoscopy on 6/28 > findings non bleeding gastric ulcers, with no stigmata of bleeding. Patient is sp 4-5  units prbc transfusion with Hgb His symptoms have improved but not yet back to baseline. Will continue pantoprazole bid, advance diet as tolerated and follow cell count in am. Iron panel consistent with anemia of chronic kidney disease combined with iron deficiency, patient will benefit of IV iron before discharge - received IV Fe 510 mg x 1 today - check H/H tonight and in am, does not appear to have active bleeding  2. T2DM. Will  continue glucose cover and monitoring with insulin sliding scale, patient will have his diet advance today.   3. Systolic heart failure. Clinically euvolemic, will continue blood pressure control. Cont carvedilol, and hydralazine. Cont Lasix 80 bid  4. CKD stage V: has appt to see Dr Marval Regal in the office in July, will get dates tomorrow. No uremic symptoms, good appetite.   5, Dyslipidemia. Continue statin therapy.     DVT prophylaxis: scd   Code Status:  full Family Communication: no family at the bedside  Disposition Plan/ discharge barriers: possible dc tomorrow  Kelly Splinter MD  pgr [phone] 12/23/2018, 4:42 PM    Consultants:   GI   Procedures:   Upper endoscopy   Antimicrobials:       Subjective: Hb low at 6.7 this am, got prbc's already.  Feeling somewhat better. Appetite good. No SOB or cough.    Objective: Vitals:   12/23/18 0917 12/23/18 0954 12/23/18 1200 12/23/18 1600  BP: 127/64 133/85 (!) 136/91 (!) 145/97  Pulse: 73  78 72  Resp: (!) 21     Temp: 98.1 F (36.7 C)   97.7 F (36.5 C)  TempSrc: Oral   Oral  SpO2: 100%  100%   Weight:      Height:        Intake/Output Summary (Last 24 hours) at 12/23/2018 1636 Last data filed at 12/23/2018 1600 Gross per 24 hour  Intake 820 ml  Output 1900 ml  Net -1080 ml   Filed Weights   12/21/18 0012 12/22/18 1301  Weight: 113.4 kg 113.4 kg    Examination:   General: Not  in pain or dyspnea  Neurology: Awake and alert, non focal  E ENT: mild pallor, no icterus, oral mucosa moist Cardiovascular: No JVD. S1-S2 present, rhythmic, no gallops, rubs, or murmurs. No lower extremity edema. Pulmonary: positive breath sounds bilaterally, adequate air movement, no wheezing, rhonchi or rales. Gastrointestinal. Abdomen with no organomegaly, non tender, no rebound or guarding Skin. No rashes Musculoskeletal: no joint deformities     Data Reviewed: I have personally reviewed following labs and imaging  studies  CBC: Recent Labs  Lab 12/21/18 2220 12/22/18 0603 12/22/18 1155 12/22/18 1638 12/23/18 0238  WBC 7.6 8.5 9.0 8.7 6.7  NEUTROABS 5.4 6.3 6.9 6.6 5.1  HGB 6.8* 7.3* 7.6* 7.7* 6.7*  HCT 20.0* 21.5* 22.9* 23.5* 20.3*  MCV 88.5 89.2 89.8 90.4 91.4  PLT 143* 130* 136* 137* 195*   Basic Metabolic Panel: Recent Labs  Lab 12/21/18 0141 12/21/18 0650 12/23/18 0238  NA 136 140 139  K 4.4 4.8 4.7  CL 108 109 112*  CO2 18* 19* 20*  GLUCOSE 152* 134* 179*  BUN 110* 114* 102*  CREATININE 7.75* 7.16* 8.05*  CALCIUM 7.8* 7.9* 7.9*   GFR: Estimated Creatinine Clearance: 15 mL/min (A) (by C-G formula based on SCr of 8.05 mg/dL (H)). Liver Function Tests: Recent Labs  Lab 12/21/18 0141 12/21/18 0650  AST 20 17  ALT 19 17  ALKPHOS 44 42  BILITOT 0.4 0.5  PROT 4.9* 4.7*  ALBUMIN 2.3* 2.2*   No results for input(s): LIPASE, AMYLASE in the last 168 hours. No results for input(s): AMMONIA in the last 168 hours. Coagulation Profile: No results for input(s): INR, PROTIME in the last 168 hours. Cardiac Enzymes: No results for input(s): CKTOTAL, CKMB, CKMBINDEX, TROPONINI in the last 168 hours. BNP (last 3 results) No results for input(s): PROBNP in the last 8760 hours. HbA1C: Recent Labs    12/21/18 1034  HGBA1C 6.0*   CBG: Recent Labs  Lab 12/22/18 1149 12/22/18 1704 12/22/18 2107 12/23/18 0747 12/23/18 1158  GLUCAP 109* 157* 129* 113* 143*   Lipid Profile: No results for input(s): CHOL, HDL, LDLCALC, TRIG, CHOLHDL, LDLDIRECT in the last 72 hours. Thyroid Function Tests: No results for input(s): TSH, T4TOTAL, FREET4, T3FREE, THYROIDAB in the last 72 hours. Anemia Panel: Recent Labs    12/21/18 0232 12/21/18 0238  VITAMINB12  --  188  FOLATE 8.7  --   FERRITIN  --  22*  TIBC  --  295  IRON  --  48  RETICCTPCT  --  3.9*      Radiology Studies: I have reviewed all of the imaging during this hospital visit personally     Scheduled Meds: .  sodium chloride   Intravenous Once  . sodium chloride   Intravenous Once  . carvedilol  25 mg Oral BID WC  . furosemide  80 mg Oral BID  . hydrALAZINE  50 mg Oral TID  . insulin aspart  0-5 Units Subcutaneous QHS  . insulin aspart  0-9 Units Subcutaneous TID WC  . [START ON 12/24/2018] pantoprazole  40 mg Intravenous Q12H   Continuous Infusions:    LOS: 2 days

## 2018-12-23 NOTE — Progress Notes (Signed)
CRITICAL VALUE ALERT  Critical Value:  Hgb 6.7  Date & Time Noted: 12/23/18 0327  Provider Notified: Silas [place], NP  Orders Received/Actions taken: Transfuse orders received.

## 2018-12-23 NOTE — Progress Notes (Signed)
Pharmacist Heart Failure Core Measure Documentation  Assessment: Corey Park has an EF documented as 25-30% on 06/17/18 by echo.  Rationale: Heart failure patients with left ventricular systolic dysfunction (LVSD) and an EF < 40% should be prescribed an angiotensin converting enzyme inhibitor (ACEI) or angiotensin receptor blocker (ARB) at discharge unless a contraindication is documented in the medical record.  This patient is not currently on an ACEI or ARB for HF.  This note is being placed in the record in order to provide documentation that a contraindication to the use of these agents is present for this encounter.  ACE Inhibitor or Angiotensin Receptor Blocker is contraindicated (specify all that apply)  []   ACEI allergy AND ARB allergy []   Angioedema []   Moderate or severe aortic stenosis []   Hyperkalemia []   Hypotension []   Renal artery stenosis [x]   Worsening renal function, preexisting renal disease or dysfunction   Arty Baumgartner , RPh Pager: [phone]  12/23/2018 3:11 PM

## 2018-12-24 DIAGNOSIS — D508 Other iron deficiency anemias: Secondary | ICD-10-CM

## 2018-12-24 LAB — TYPE AND SCREEN
ABO/RH(D): O POS
Antibody Screen: NEGATIVE
Unit division: 0
Unit division: 0
Unit division: 0
Unit division: 0
Unit division: 0

## 2018-12-24 LAB — BPAM RBC
Blood Product Expiration Date: 202007012359
Blood Product Expiration Date: 202007222359
Blood Product Expiration Date: 202007222359
Blood Product Expiration Date: 202007222359
Blood Product Expiration Date: 202007232359
ISSUE DATE / TIME: 202006270358
ISSUE DATE / TIME: 202006271247
ISSUE DATE / TIME: 202006271622
ISSUE DATE / TIME: 202006272359
ISSUE DATE / TIME: 202006290513
Unit Type and Rh: 5100
Unit Type and Rh: 5100
Unit Type and Rh: 5100
Unit Type and Rh: 5100
Unit Type and Rh: 9500

## 2018-12-24 LAB — GLUCOSE, CAPILLARY
Glucose-Capillary: 127 mg/dL — ABNORMAL HIGH (ref 70–99)
Glucose-Capillary: 147 mg/dL — ABNORMAL HIGH (ref 70–99)
Glucose-Capillary: 179 mg/dL — ABNORMAL HIGH (ref 70–99)

## 2018-12-24 LAB — BASIC METABOLIC PANEL
Anion gap: 8 (ref 5–15)
BUN: 93 mg/dL — ABNORMAL HIGH (ref 6–20)
CO2: 22 mmol/L (ref 22–32)
Calcium: 8.6 mg/dL — ABNORMAL LOW (ref 8.9–10.3)
Chloride: 110 mmol/L (ref 98–111)
Creatinine, Ser: 8.04 mg/dL — ABNORMAL HIGH (ref 0.61–1.24)
GFR calc Af Amer: 8 mL/min — ABNORMAL LOW (ref >60)
GFR calc non Af Amer: 7 mL/min — ABNORMAL LOW (ref >60)
Glucose, Bld: 134 mg/dL — ABNORMAL HIGH (ref 70–99)
Potassium: 4.3 mmol/L (ref 3.5–5.1)
Sodium: 140 mmol/L (ref 135–145)

## 2018-12-24 LAB — CBC
HCT: 25 % — ABNORMAL LOW (ref 39.0–52.0)
Hemoglobin: 8.1 g/dL — ABNORMAL LOW (ref 13.0–17.0)
MCH: 29.8 pg (ref 26.0–34.0)
MCHC: 32.4 g/dL (ref 30.0–36.0)
MCV: 91.9 fL (ref 80.0–100.0)
Platelets: 147 10*3/uL — ABNORMAL LOW (ref 150–400)
RBC: 2.72 MIL/uL — ABNORMAL LOW (ref 4.22–5.81)
RDW: 15.4 % (ref 11.5–15.5)
WBC: 5.9 10*3/uL (ref 4.0–10.5)
nRBC: 0 % (ref 0.0–0.2)

## 2018-12-24 MED ORDER — PANTOPRAZOLE SODIUM 40 MG IV SOLR: 40.0000 mg | Freq: Two times a day (BID) | INTRAVENOUS | 1 refills | Status: DC

## 2018-12-24 MED ORDER — PANTOPRAZOLE SODIUM 40 MG PO TBEC: 40.0000 mg | DELAYED_RELEASE_TABLET | Freq: Two times a day (BID) | ORAL | 0 refills | Status: DC

## 2018-12-24 MED ORDER — PANTOPRAZOLE SODIUM 40 MG PO TBEC: 40.0000 mg | DELAYED_RELEASE_TABLET | Freq: Every day | ORAL | 3 refills | Status: DC

## 2018-12-24 NOTE — Progress Notes (Signed)
Pt without PCP. NCM shared [place]. Pt interested . Hospital follow up appointment arranged and noted on AVS.  Pt states legally blind, doesn't drives. States needs assistance with transportation to MD appointments. [employer] application completed and faxed [phone] Darrell Jewel). Approval pending....NCM to f/u with pt [phone]) with response. Whitman Hero RN,BSN,CM

## 2018-12-24 NOTE — Discharge Summary (Signed)
Physician Discharge Summary  Patient ID: Other Atienza MRN: [id number] DOB/AGE: [birth year] 48 y.o.  Admit date: 12/21/2018 Discharge date: 12/24/2018  Admission Diagnoses   Symptomatic anemia   GI bleed   Diabetes   Systolic dysfunction CHF   Hyperlipidemia   Chronic kidney disease stage V  Discharge Diagnoses:  Principal Problem:   Symptomatic anemia Active Problems:   Chronic kidney disease V   Anemia in chronic kidney disease (CKD)   Fe deficiency anemia   DM (diabetes mellitus), type 2 (HCC)   Chronic systolic heart failure (HCC)   HLD (hyperlipidemia)   Hypertensive heart disease with CHF (congestive heart failure) (HCC)   Dilated cardiomyopathy (HCC)   Acute renal failure superimposed on stage 5 chronic kidney disease, not on chronic dialysis [facility])  HPI: 48 year old male who presented with dyspnea and hyperglycemia.  He does have significant past medical history for systolic heart failure, type 2 diabetes mellitus, hypertension, asthma and chronic kidney disease stage V.  Patient developed acute worsening dyspnea on exertion, symptoms with minimal efforts, on his initial physical examination his temperature was 98, blood pressure 152/93, heart rate 111, respiratory rate 32, oxygen saturation 99%, moist mucous membranes, positive JVD, his lungs had diffuse bilateral rails, heart S1-S2 present, tachycardic, abdomen was soft, 2+ lower extremity edema.  Sodium 136, potassium 4.4, chloride 108, bicarb 18, glucose 152, BUN 110, creatinine 7.7, troponin I 103, white count 8.0, hemoglobin 4.6, hematocrit 15, platelets 154.   Patient was admitted to the hospital with a working diagnosis of symptomatic anemia.  Assessment & Plan:   Principal Problem:   Symptomatic anemia Active Problems:   DM (diabetes mellitus), type 2 (HCC)   HLD (hyperlipidemia)   Hypertensive heart disease with CHF (congestive heart failure) (HCC)   Chronic kidney disease V   Chronic systolic heart  failure (HCC)   Dilated cardiomyopathy [facility])  Hospital Course: 1. Symptomatic anemia - pt had upper endoscopy on 6/28 > findings small non bleeding gastric ulcer, with no stigmata of bleeding. Anemia likely due Fe Def and CKD V in part. No strong evidence of GI bleed on endoscopy.  - sp 4-5  units prbc transfusion with Hgb 4.9 on admit >>> 8.1 here - continue pantoprazole 40 bid at dc - patient received IV Fe 510 mg x 1 here on 6/29 - OK for dc today per GI - F/U GI in 4-6 wks  2. Systolic heart failure - stable euvolemic, CKD V, cont Lasix 80 bid  3. CKD stage V:  - pt knows to keep his appt to see Dr Marval Regal on July 8th next week, pt states his mother will be taking him to the appt. Explained to him he has late stage CKD and needs close f/u w/ his kidney doctors.   4 Dyslipidemia. Continue statin therapy.  5  Diabetes mellitus 2: not on any diabetes medications at home, BS's here reasonable under 140-150 max.  No new Rx's at dc.     Consultants:   GI   Procedures:   Upper endoscopy    Discharge Exam: Blood pressure (!) 139/92, pulse 69, temperature 97.7 F (36.5 C), temperature source Oral, resp. rate (!) 21, height 6\' 1"  (1.854 m), weight 106.5 kg, SpO2 99 %. Alert, no distress  no jvd  Chest cta bilat   Cor reg no mrg   Abd soft ntnd   Ext no edema LE's  Disposition: Discharge disposition: 01-Home or Self Care        Allergies as of  12/24/2018   No Known Allergies     Medication List    TAKE these medications   atorvastatin 20 MG tablet Commonly known as: LIPITOR Take 1 tablet (20 mg total) by mouth daily at 6 PM.   carvedilol 25 MG tablet Commonly known as: COREG Take 1 tablet (25 mg total) by mouth 2 (two) times daily with a meal.   ferrous sulfate 325 (65 FE) MG tablet Take 1 tablet (325 mg total) by mouth daily.   furosemide 80 MG tablet Commonly known as: LASIX Take 1 tablet (80 mg total) by mouth 2 (two) times daily.    hydrALAZINE 50 MG tablet Commonly known as: APRESOLINE Take 1 tablet (50 mg total) by mouth 3 (three) times daily.   pantoprazole 40 MG injection Commonly known as: PROTONIX Inject 40 mg into the vein 2 (two) times daily for 60 doses.   pantoprazole 40 MG tablet Commonly known as: Protonix Take 1 tablet (40 mg total) by mouth daily. Start taking on: February 23, 2019      Follow-up Information    Donato Heinz, MD Follow up on 01/01/2019.   Specialty: Nephrology Why: 3:00 PM , July 8th w/ Dr [facility] doctor Contact information: [address] [place] [address] [place] Follow up on 01/02/2019.   Why: Hospital follow-up appointment with Freeman Caldron PA @ 2:30pm. Contact information: [place] [address] [phone]       Otis Brace, MD Follow up in 4 week(s).   Specialty: Gastroenterology Why: Call for appt please Contact information: [place]  [id number] [phone]           Signed: Sol Blazing 12/24/2018, 2:52 PM

## 2018-12-24 NOTE — Progress Notes (Signed)
[facility] Progress Note  Corey Park 48 y.o. [birth year]  CC: GI bleed   Subjective: Patient resting comfortably in the bed.  Denies any bleeding episodes.  Denies abdominal pain, nausea or vomiting.  ROS : negative for chest pain and shortness of breath.   Objective: Vital signs in last 24 hours: Vitals:   12/24/18 1037 12/24/18 1115  BP: (!) 143/95 (!) 136/91  Pulse:    Resp:    Temp:    SpO2:      Physical Exam:  General:  Alert, cooperative, no distress, appears stated age  Head:  Normocephalic, without obvious abnormality, atraumatic  Eyes:  , EOM's intact,   Lungs:   Clear to auscultation bilaterally, respirations unlabored  Heart:  Regular rate and rhythm, S1, S2 normal  Abdomen:   Soft, non-tender, bowel sounds+, ND, no peritoneal signs  Extremities: Extremities normal, atraumatic, no  edema  Pulses: 2+ and symmetric    Lab Results: Recent Labs    12/23/18 0238 12/24/18 0333  NA 139 140  K 4.7 4.3  CL 112* 110  CO2 20* 22  GLUCOSE 179* 134*  BUN 102* 93*  CREATININE 8.05* 8.04*  CALCIUM 7.9* 8.6*   No results for input(s): AST, ALT, ALKPHOS, BILITOT, PROT, ALBUMIN in the last 72 hours. Recent Labs    12/22/18 1638 12/23/18 0238 12/23/18 1626 12/24/18 0333  WBC 8.7 6.7  --  5.9  NEUTROABS 6.6 5.1  --   --   HGB 7.7* 6.7* 7.9* 8.1*  HCT 23.5* 20.3* 23.8* 25.0*  MCV 90.4 91.4  --  91.9  PLT 137* 114*  --  147*   No results for input(s): LABPROT, INR in the last 72 hours.    Assessment/Plan: -GI bleed.  EGD yesterday showed tiny gastric ulcer as well as few polyps.  Biopsies negative for H. pylori. -Chronic kidney disease -Dilated cardiomyopathy with EF of 30%.  Recommendations -------------------------- -No further bleeding episodes.  Hemoglobin stable. -Okay to discharge home from GI standpoint on oral pantoprazole. -Follow-up in GI clinic in 4 to 6 weeks. -GI will sign off.  Call us back if needed   Otis Brace MD, [place] 12/24/2018, 1:00 PM  Contact #  [phone]

## 2018-12-24 NOTE — Progress Notes (Signed)
Patients IV's removed and discharge papers reviewed. All questions answered patients skin clean, dry and intact.

## 2019-01-01 NOTE — Progress Notes (Deleted)
Patient ID: Corey Park, male   DOB: 1970/08/19, 48 y.o.   MRN: [id number]  After hospitalization 6/27-6/30/2020  From discharge summary: Admit date: 12/21/2018 Discharge date: 12/24/2018  Admission Diagnoses   Symptomatic anemia   GI bleed   Diabetes   Systolic dysfunction CHF   Hyperlipidemia   Chronic kidney disease stage V  Discharge Diagnoses:  Principal Problem:   Symptomatic anemia Active Problems:   Chronic kidney disease V   Anemia in chronic kidney disease (CKD)   Fe deficiency anemia   DM (diabetes mellitus), type 2 (HCC)   Chronic systolic heart failure (HCC)   HLD (hyperlipidemia)   Hypertensive heart disease with CHF (congestive heart failure) (HCC)   Dilated cardiomyopathy (HCC)   Acute renal failure superimposed on stage 5 chronic kidney disease, not on chronic dialysis [facility])  HPI: 48 year old male who presented with dyspnea and hyperglycemia. He does have significant past medical history for systolic heart failure, type 2 diabetes mellitus, hypertension, asthma and chronic kidney disease stage V. Patient developed acute worsening dyspnea on exertion, symptoms with minimal efforts, on his initial physical examination his temperature was 98, blood pressure 152/93, heart rate 111, respiratory rate 32, oxygen saturation 99%, moist mucous membranes, positive JVD, his lungs had diffuse bilateral rails, heart S1-S2 present, tachycardic, abdomen was soft, 2+ lower extremity edema. Sodium 136, potassium 4.4, chloride 108, bicarb 18, glucose 152, BUN 110, creatinine 7.7, troponin I 103, white count 8.0, hemoglobin 4.6, hematocrit 15, platelets 154.   Patient was admitted to the hospital with a working diagnosis of symptomatic anemia.  Assessment & Plan:  Principal Problem: Symptomatic anemia Active Problems: DM (diabetes mellitus), type 2 (HCC) HLD (hyperlipidemia) Hypertensive heart disease with CHF (congestive heart failure) (HCC) Chronic kidney  disease V Chronic systolic heart failure (HCC) Dilated cardiomyopathy [facility])  Hospital Course: 1. Symptomatic anemia - pt had upper endoscopyon 6/28 >findings small non bleeding gastric ulcer, with no stigmata of bleeding. Anemia likely due Fe Def and CKD V in part. No strong evidence of GI bleed on endoscopy.  - sp 4-5units prbc transfusion with Hgb 4.9 on admit >>> 8.1 here - continue pantoprazole 40 bid at dc - patient received IV Fe 510 mg x 1 here on 6/29 - OK for dc today per GI - F/U GI in 4-6 wks  2. Systolic heart failure - stable euvolemic, CKD V, cont Lasix 80 bid  3. CKD stage V: - pt knows to keep his appt to see Dr Marval Regal on July 8th next week, pt states his mother will be taking him to the appt. Explained to him he has late stage CKD and needs close f/u w/ his kidney doctors.   4 Dyslipidemia. Continue statin therapy.  5  Diabetes mellitus 2: not on any diabetes medications at home, BS's here reasonable under 140-150 max.  No new Rx's at dc.

## 2019-01-02 ENCOUNTER — Inpatient Hospital Stay: Payer: Medicaid Other

## 2019-01-16 ENCOUNTER — Ambulatory Visit: Payer: Medicaid Other | Attending: Family Medicine | Admitting: Family Medicine

## 2019-01-16 ENCOUNTER — Other Ambulatory Visit: Payer: Self-pay

## 2019-01-16 ENCOUNTER — Encounter: Payer: Self-pay | Admitting: Family Medicine

## 2019-01-16 VITALS — BP 159/85 | HR 73 | Temp 98.8°F | Ht 73.0 in | Wt 233.0 lb

## 2019-01-16 DIAGNOSIS — E1129 Type 2 diabetes mellitus with other diabetic kidney complication: Secondary | ICD-10-CM

## 2019-01-16 DIAGNOSIS — N185 Chronic kidney disease, stage 5: Secondary | ICD-10-CM | POA: Diagnosis present

## 2019-01-16 DIAGNOSIS — E611 Iron deficiency: Secondary | ICD-10-CM | POA: Diagnosis not present

## 2019-01-16 DIAGNOSIS — D631 Anemia in chronic kidney disease: Secondary | ICD-10-CM

## 2019-01-16 DIAGNOSIS — R809 Proteinuria, unspecified: Secondary | ICD-10-CM | POA: Diagnosis not present

## 2019-01-16 DIAGNOSIS — E1122 Type 2 diabetes mellitus with diabetic chronic kidney disease: Secondary | ICD-10-CM | POA: Insufficient documentation

## 2019-01-16 DIAGNOSIS — I110 Hypertensive heart disease with heart failure: Secondary | ICD-10-CM

## 2019-01-16 DIAGNOSIS — I420 Dilated cardiomyopathy: Secondary | ICD-10-CM | POA: Insufficient documentation

## 2019-01-16 DIAGNOSIS — K253 Acute gastric ulcer without hemorrhage or perforation: Secondary | ICD-10-CM | POA: Diagnosis not present

## 2019-01-16 DIAGNOSIS — I132 Hypertensive heart and chronic kidney disease with heart failure and with stage 5 chronic kidney disease, or end stage renal disease: Secondary | ICD-10-CM | POA: Diagnosis not present

## 2019-01-16 DIAGNOSIS — E785 Hyperlipidemia, unspecified: Secondary | ICD-10-CM | POA: Insufficient documentation

## 2019-01-16 DIAGNOSIS — Z8249 Family history of ischemic heart disease and other diseases of the circulatory system: Secondary | ICD-10-CM | POA: Diagnosis not present

## 2019-01-16 DIAGNOSIS — I5022 Chronic systolic (congestive) heart failure: Secondary | ICD-10-CM | POA: Diagnosis not present

## 2019-01-16 DIAGNOSIS — Z79899 Other long term (current) drug therapy: Secondary | ICD-10-CM | POA: Insufficient documentation

## 2019-01-16 DIAGNOSIS — I5042 Chronic combined systolic (congestive) and diastolic (congestive) heart failure: Secondary | ICD-10-CM | POA: Diagnosis not present

## 2019-01-16 DIAGNOSIS — Z09 Encounter for follow-up examination after completed treatment for conditions other than malignant neoplasm: Secondary | ICD-10-CM

## 2019-01-16 LAB — GLUCOSE, POCT (MANUAL RESULT ENTRY): POC Glucose: 143 mg/dL — AB (ref 70–99)

## 2019-01-16 MED ORDER — ATORVASTATIN CALCIUM 20 MG PO TABS: ORAL_TABLET | ORAL | 6 refills | Status: DC

## 2019-01-16 MED ORDER — CARVEDILOL 25 MG PO TABS: 25.0000 mg | ORAL_TABLET | Freq: Two times a day (BID) | ORAL | 3 refills | Status: DC

## 2019-01-16 MED ORDER — FERROUS SULFATE 325 (65 FE) MG PO TABS: 325.0000 mg | ORAL_TABLET | Freq: Every day | ORAL | 3 refills | Status: DC

## 2019-01-16 MED ORDER — HYDRALAZINE HCL 50 MG PO TABS: 50.0000 mg | ORAL_TABLET | Freq: Three times a day (TID) | ORAL | 3 refills | Status: DC

## 2019-01-16 NOTE — Progress Notes (Signed)
Patient ID: Corey Park, male    DOB: 08/21/1970  MRN: [id number]   SUBJECTIVE:  Corey Park is a 48 y.o. male who presents for hospital f/u. Where: [facility]  When: 12/21/2018-12/24/2018 Primary Dx: Symptomatic anemia, GI bleed, diabetes-diet controlled, systolic dysfunction CHF, hyperlipidemia, chronic kidney disease stage V; hemoglobin was 4.6 on admission; EGD on 12/22/2018 with findings of small nonbleeding gastric ulcer and anemia was thought to be secondary to iron deficiency and stage V chronic kidney disease.  Patient had 4 to 5 units of packed red blood cells with hemoglobin increasing to 8.1 on discharge.  HPI:       48 year old male new to the practice who is here to establish care and follow-up of recent hospitalization for symptomatic anemia.  Patient is status post hospitalization from 12/21/2018 through 12/24/2018.  Patient reports that prior to hospitalization he had developed generalized weakness and fatigue.  He has medical history significant for type 2 diabetes for which he is not currently on medication, heart failure, chronic kidney disease, hypertension, asthma and chronic anemia.  He states that he feels much better status post transfusions done during hospitalization.  He denies any chest pain or palpitations, no shortness of breath or cough.  He reports that he is really not had issues with asthma since childhood.  He has not yet picked up his medications from the hospital status post hospital discharge.  He has not yet started pantoprazole.  He denies any blood in the stool and no black stools.  He also needs to pick up iron supplement from the pharmacy.  He does have regular cardiology follow-up regarding his heart failure and reports that he is taking his Lasix but has to pick up some additional blood pressure medication today and this may be why his blood pressure is elevated at today's visit.  He has had no issues with increased shortness of breath and no  leg swelling recently.  He has never seen nephrology but does have an upcoming appointment to discuss his chronic kidney disease.  He denies any issues with increased thirst and no urinary frequency or blurred vision related to his diabetes.  He denies any numbness or tingling in his feet.  He has had no recent fever or chills.  No loss of appetite.  He denies any abdominal pain-no constipation or diarrhea.  He denies any headaches or dizziness related to his blood pressure.   Patient Active Problem List   Diagnosis Date Noted  . Anemia in chronic kidney disease (CKD) 12/23/2018  . Fe deficiency anemia 12/23/2018  . Acute renal failure superimposed on stage 5 chronic kidney disease, not on chronic dialysis ([place])   . Symptomatic anemia 12/21/2018  . Chronic systolic heart failure ([place]) 07/03/2018  . Dilated cardiomyopathy ([employer]) 07/03/2018  . Elevated troponin   . Chronic kidney disease V 06/17/2018  . Hypomagnesemia 12/13/2016  . Hypertensive urgency 12/11/2016  . AKI (acute kidney injury) ([place]) 12/11/2016  . Fluid overload 12/11/2016  . HLD (hyperlipidemia) 06/14/2007  . DM (diabetes mellitus), type 2 ([place]) 06/11/2007  . Hypertensive heart disease with CHF (congestive heart failure) ([place]) 06/11/2007     Current Outpatient Medications on File Prior to Visit  Medication Sig Dispense Refill  . atorvastatin (LIPITOR) 20 MG tablet Take 1 tablet (20 mg total) by mouth daily at 6 PM. 30 tablet 0  . furosemide (LASIX) 80 MG tablet Take 1 tablet (80 mg total) by mouth 2 (two) times daily. 90 tablet 3  . [  START ON 02/23/2019] pantoprazole (PROTONIX) 40 MG tablet Take 1 tablet (40 mg total) by mouth daily. 30 tablet 3  . pantoprazole (PROTONIX) 40 MG tablet Take 1 tablet (40 mg total) by mouth 2 (two) times daily. 120 tablet 0  . carvedilol (COREG) 25 MG tablet Take 1 tablet (25 mg total) by mouth 2 (two) times daily with a meal. 60 tablet 0  . ferrous sulfate 325 (65 FE) MG tablet Take 1 tablet  (325 mg total) by mouth daily. 30 tablet 1  . hydrALAZINE (APRESOLINE) 50 MG tablet Take 1 tablet (50 mg total) by mouth 3 (three) times daily. 270 tablet 3   No current facility-administered medications on file prior to visit.     No Known Allergies  Social History   Tobacco Use  . Smoking status: Never Smoker  . Smokeless tobacco: Never Used  Substance Use Topics  . Alcohol use: Never    Frequency: Never    Comment: denies  . Drug use: Never    Types: Marijuana    Comment: denies     Family History  Problem Relation Age of Onset  . CAD Mother   . Hypertension Mother   . Diabetes Neg Hx   . Stroke Neg Hx   . Cancer Neg Hx   . Kidney failure Neg Hx     Past Surgical History:  Procedure Laterality Date  . BIOPSY  12/22/2018   Procedure: BIOPSY;  Surgeon: Laurence Spates, MD;  Location: [place];  Service: Endoscopy;;  . ESOPHAGOGASTRODUODENOSCOPY N/A 12/22/2018   Procedure: ESOPHAGOGASTRODUODENOSCOPY (EGD);  Surgeon: Laurence Spates, MD;  Location: [facility] ENDOSCOPY;  Service: Endoscopy;  Laterality: N/A;    ROS: Review of Systems  Constitutional: Positive for fatigue (Improved). Negative for chills and fever.  HENT: Negative for sore throat and trouble swallowing.   Eyes: Negative for photophobia and visual disturbance.  Respiratory: Negative for cough and shortness of breath.   Cardiovascular: Negative for chest pain, palpitations and leg swelling.  Gastrointestinal: Negative for abdominal pain, blood in stool, constipation, diarrhea and nausea.  Endocrine: Negative for cold intolerance, heat intolerance, polydipsia, polyphagia and polyuria.  Genitourinary: Negative for dysuria and frequency.  Musculoskeletal: Negative for back pain and gait problem.  Skin: Negative for rash and wound.  Neurological: Negative for dizziness and headaches.  Hematological: Negative for adenopathy. Does not bruise/bleed easily.     PHYSICAL EXAM: BP (!) 159/85   Pulse 73   Temp  98.8 F (37.1 C) (Oral)   Ht 6\' 1"  (1.854 m)   Wt 233 lb (105.7 kg)   SpO2 98%   BMI 30.74 kg/m    Physical Exam Vitals signs and nursing note reviewed.  Constitutional:      General: He is not in acute distress.    Appearance: Normal appearance.     Comments: WNWD adult male, slightly overweight, in NAD; accompanied by his mother  Neck:     Musculoskeletal: Normal range of motion and neck supple. No neck rigidity or muscular tenderness.     Vascular: No carotid bruit.  Cardiovascular:     Rate and Rhythm: Normal rate and regular rhythm.     Pulses:          Dorsalis pedis pulses are 2+ on the right side and 2+ on the left side.       Posterior tibial pulses are 2+ on the right side and 2+ on the left side.  Pulmonary:     Effort: Pulmonary effort is normal.  Breath sounds: Normal breath sounds.  Abdominal:     General: Bowel sounds are normal.     Palpations: Abdomen is soft.     Tenderness: There is no abdominal tenderness. There is no right CVA tenderness, left CVA tenderness, guarding or rebound.     Comments: Truncal obesity  Musculoskeletal:        General: No tenderness or deformity.     Right lower leg: No edema.     Left lower leg: No edema.     Right foot: Normal range of motion. No deformity, bunion, Charcot foot, foot drop or prominent metatarsal heads.     Left foot: Normal range of motion. No deformity, bunion, Charcot foot, foot drop or prominent metatarsal heads.     Comments: Puffiness at left medial ankle (history of ankle injury)  Feet:     Right foot:     Protective Sensation: 10 sites tested. 10 sites sensed.     Skin integrity: Dry skin present. No ulcer, blister, skin breakdown, erythema, warmth or callus.     Toenail Condition: Right toenails are abnormally thick.     Left foot:     Protective Sensation: 10 sites tested. 10 sites sensed.     Skin integrity: Callus (side of foot below the little toe) and dry skin present. No ulcer, blister, skin  breakdown, erythema or warmth.     Toenail Condition: Left toenails are abnormally thick, long and ingrown. Fungal disease present. Lymphadenopathy:     Cervical: No cervical adenopathy.  Skin:    General: Skin is warm and dry.  Neurological:     General: No focal deficit present.     Mental Status: He is alert and oriented to person, place, and time.  Psychiatric:        Mood and Affect: Mood normal.        Behavior: Behavior normal.        Thought Content: Thought content normal.        Judgment: Judgment normal.      ASSESSMENT AND PLAN: 1. Type 2 diabetes mellitus with microalbuminuria, without long-term current use of insulin ([place]); Hospital discharge follow-up Patient is status post hospitalization for symptomatic anemia due to chronic disease including stage V chronic kidney disease.  Patient also with history of diabetes but is not currently on medication.  Per hospital discharge summary, his blood sugars range between 1 50-1 60 during hospitalization.  Patient had hemoglobin A1c of 6.0 during hospitalization on 12/21/2018.  Patient will have BMP and lipid panel in follow-up and he should continue use of atorvastatin.  Patient's hospital notes, imaging/procedures and labs reviewed and pertinent findings discussed with the patient and his mother at today's visit. - POCT glucose (manual entry) - Basic Metabolic Panel - Lipid panel - atorvastatin (LIPITOR) 20 MG tablet; One pill each evening to lower cholesterol  Dispense: 30 tablet; Refill: 6  2. Anemia in stage 5 chronic kidney disease, not on chronic dialysis [facility]) He is encouraged to keep upcoming follow-up with nephrology regarding stage V chronic kidney disease.  During hospitalization, patient's creatinine was 7.7.  He will have CBC in follow-up of anemia status post transfusion of 5 to 6 units of packed red blood cells during his hospitalization.  Educational material on end-stage kidney disease provided as part of AVS-after  visit summary.  Continue use of ferrous sulfate as patient also with iron deficiency. - CBC with Differential - ferrous sulfate 325 (65 FE) MG tablet; Take 1 tablet (325  mg total) by mouth daily.  Dispense: 30 tablet; Refill: 3  3. Chronic systolic heart failure (HCC) Continue follow-up with cardiology.  Patient's heart failure appears to be stable at this time as he has no peripheral edema and no rales on exam.  Continue Lasix 80 mg twice daily - Basic Metabolic Panel  4. Stage 5 chronic kidney disease not on chronic dialysis [facility]) Patient will have BMP and CBC at today's visit.  Patient's creatinine is about 7.7.  He is to follow-up with nephrology as per hospital discharge. - CBC with Differential - Basic Metabolic Panel  5. Hypertensive heart disease with chronic combined systolic and diastolic congestive heart failure ([place]) Patient's blood pressure is slightly elevated at today's visit but he has not picked up all of his medication refills.  Prescriptions sent to patient's pharmacy for carvedilol and hydralazine refills. - Basic Metabolic Panel - Lipid panel - carvedilol (COREG) 25 MG tablet; Take 1 tablet (25 mg total) by mouth 2 (two) times daily with a meal.  Dispense: 60 tablet; Refill: 3 - hydrALAZINE (APRESOLINE) 50 MG tablet; Take 1 tablet (50 mg total) by mouth 3 (three) times daily.  Dispense: 270 tablet; Refill: 3  6. Acute gastric ulcer without hemorrhage or perforation Patient was found to have a nonbleeding ulcer on EGD done during recent hospitalization.  Discussed importance of taking pantoprazole twice daily x60 days as per hospital discharge then may switch to 40 mg once daily.  Patient reports that he has not yet started this medication but will pick it up from the pharmacy today.  Future Appointments  Date Time Provider [place]  02/18/2019 11:00 AM [facility]  02/18/2019 11:30 AM [facility]  02/18/2019 12:00 PM Marty Heck, MD [facility]   An After Visit Summary was printed and given to the patient.  Return in about 3 months (around 04/18/2019) for HTN/DM.    More than 25 minutes of direct patient care provided at today's visit with additional time spent for review of notes, labs/procedures from patient's recent hospitalization.  Antony Blackbird, MD, [facility]

## 2019-01-16 NOTE — Patient Instructions (Addendum)
Please take Protonix (pantoprazole) 40 mg twice daily for 60 days then decrease to one pill daily. End-Stage Kidney Disease End-stage kidney disease occurs when the kidneys are so damaged that they cannot function and cannot get better. This condition may also be referred to as end-stage renal disease or ESRD. The kidneys are two organs that do many important jobs in the body, including:  Removing wastes and extra fluids from the blood.  Making hormones that maintain the amount of fluid in your tissues and blood vessels.  Maintaining the right amount of fluids and chemicals in the body. Without functioning kidneys, toxins build up in the blood and life-threatening complications can occur. What are the causes? This condition usually occurs when a long-term (chronic) kidney disease gets worse and results in permanent damage to the kidneys. It may also be caused by sudden damage to the kidneys (acute kidney injury). Causes of this condition include:  Having a family history of chronic kidney disease (CKD).  Having chronic kidney disease for many years.  Chronic medical conditions that affect the kidneys, such as: ? Cardiovascular disease, including high blood pressure. ? Diabetes. ? Certain diseases that affect the body's disease-fighting (immune) system.  Overuse of over-the-counter pain medicines.  Being around or being in contact with poisonous (toxic) substances. What increases the risk? The following factors may make you more likely to develop this condition:  Being older than 60.  Being male.  Being of African-American, Asian, Native American, [place], or Hispanic descent.  Smoking or a history of smoking.  Obesity. What are the signs or symptoms? Symptoms of this condition include:  Swelling (edema) of the face, legs, ankles, or feet.  Numbness, tingling, or loss of feeling in the hands or feet.  Tiredness (lethargy).  Nausea or vomiting.  Confusion,  trouble concentrating, or loss of consciousness.  Chest pain.  Shortness of breath.  Passing little or no urine.  Muscle twitches and cramps, especially in the legs.  Dry, itchy skin.  Loss of appetite.  Pale skin due to anemia, including the skin and tissue around the eye (conjunctiva).  Headaches.  Abnormally dark or light skin.  Decrease in muscle size (muscle wasting).  Easy bruising.  Frequent hiccups.  Stopping of the monthly period in women.  Jerky movements (seizures). How is this diagnosed? This condition may be diagnosed based on:  A physical exam, including blood pressure measurements.  Urine tests.  Blood tests.  Imaging tests.  A test in which a sample of tissue is removed from the kidneys to be examined under a microscope (kidney biopsy). How is this treated? This condition may be treated with:  A procedure that removes toxic wastes from the body (dialysis). There are two types of dialysis: ? Dialysis that is done through your abdomen (peritoneal dialysis). This may be done several times a day. ? Dialysis that is done by a machine (hemodialysis). This may be done several times a week.  Surgery to receive a new kidney (kidney transplant). In addition to having dialysis or a kidney transplant, you may need to take medicines:  To control high blood pressure (hypertension).  To control high cholesterol.  To treat diabetes.  To maintain healthy levels of minerals in the blood (electrolytes). You may also be given a specific meal plan to follow that includes requirements or limits for:  Salt (sodium).  Protein.  Phosphorous.  Potassium.  Calcium. Follow these instructions at home: Medicines  Take over-the-counter and prescription medicines only as told  by your health care provider.  Do not take any new medicines, vitamins, or mineral supplements unless approved by your health care provider. Many medicines and supplements can worsen  kidney damage.  Follow instructions from your health care provider about adjusting the doses of any medicines you take. Lifestyle  Do not use any products that contain nicotine or tobacco, such as cigarettes and e-cigarettes. If you need help quitting, ask your health care provider.  Achieve and maintain a healthy weight. If you need help with this, ask your health care provider.  Start or continue an exercise plan. Exercise at least 30 minutes a day, 5 days a week.  Follow your prescribed meal plan. General instructions  Stay current with your shots (immunizations) as told by your health care provider.  Keep track of your blood pressure. Report changes in your blood pressure as told by your health care provider.  If you are being treated for diabetes, monitor and track your blood sugar (blood glucose) levels as told by your health care provider.  Keep all follow-up visits as told by your health care provider. This is important. Where to find more information  American Association of Kidney Patients: BombTimer.gl  National Kidney Foundation: www.kidney.[place]: https://mathis.com/  [facility]: www.lifeoptions.org and www.kidneyschool.org Contact a health care provider if:  Your symptoms get worse.  You develop new symptoms. Get help right away if:  You have weakness in an arm or leg on one side of your body.  You have difficulty speaking or you are slurring your speech.  You have a sudden change in your vision.  You have a sudden, severe headache.  You have a sudden weight increase.  You have difficulty breathing.  Your symptoms suddenly get worse. Summary  End-stage kidney disease occurs when the kidneys are so damaged that they cannot function and cannot get better.  Without functioning kidneys, toxins build up in the blood and life-threatening complications can occur.  Treatment may include dialysis or a kidney  transplant along with medicines and lifestyle changes. This information is not intended to replace advice given to you by your health care provider. Make sure you discuss any questions you have with your health care provider. Document Released: 09/02/2003 Document Revised: 05/25/2017 Document Reviewed: 07/18/2016 Elsevier Patient Education  2020 [place].  Peptic Ulcer  A peptic ulcer is a painful sore in the lining of your stomach or the first part of your small intestine. What are the causes? Common causes of this condition include:  An infection.  Using certain pain medicines too often or too much. What increases the risk? You are more likely to get this condition if you:  Smoke.  Have a family history of ulcer disease.  Drink alcohol.  Have been hospitalized in an intensive care unit (ICU). What are the signs or symptoms? Symptoms include:  Burning pain in the area between the chest and the belly button. The pain may: ? Not go away (be persistent). ? Be worse when your stomach is empty. ? Be worse at night.  Heartburn.  Feeling sick to your stomach (nauseous) and throwing up (vomiting).  Bloating. If the ulcer results in bleeding, it can cause you to:  Have poop (stool) that is black and looks like tar.  Throw up bright red blood.  Throw up material that looks like coffee grounds. How is this treated? Treatment for this condition may include:  Stopping things that can cause the ulcer, such as: ?  Smoking. ? Using pain medicines.  Medicines to reduce stomach acid.  Antibiotic medicines if the ulcer is caused by an infection.  A procedure that is done using a small, flexible tube that has a camera at the end (upper endoscopy). This may be done if you have a bleeding ulcer.  Surgery. This may be needed if: ? You have a lot of bleeding. ? The ulcer caused a hole somewhere in the digestive system. Follow these instructions at home:  Do not drink  alcohol if your doctor tells you not to drink.  Limit how much caffeine you take in.  Do not use any products that contain nicotine or tobacco, such as cigarettes, e-cigarettes, and chewing tobacco. If you need help quitting, ask your doctor.  Take over-the-counter and prescription medicines only as told by your doctor. ? Do not stop or change your medicines unless you talk with your doctor about it first. ? Do not take aspirin, ibuprofen, or other NSAIDs unless your doctor told you to do so.  Keep all follow-up visits as told by your doctor. This is important. Contact a doctor if:  You do not get better in 7 days after you start treatment.  You keep having an upset stomach (indigestion) or heartburn. Get help right away if:  You have sudden, sharp pain in your belly (abdomen).  You have belly pain that does not go away.  You have bloody poop (stool) or black, tarry poop.  You throw up blood. It may look like coffee grounds.  You feel light-headed or feel like you may pass out (faint).  You get weak.  You get sweaty or feel sticky and cold to the touch (clammy). Summary  Symptoms of a peptic ulcer include burning pain in the area between the chest and the belly button.  Take medicines only as told by your doctor.  Limit how much alcohol and caffeine you have.  Keep all follow-up visits as told by your doctor. This information is not intended to replace advice given to you by your health care provider. Make sure you discuss any questions you have with your health care provider. Document Released: 09/06/2009 Document Revised: 12/18/2017 Document Reviewed: 12/18/2017 Elsevier Patient Education  2020 [employer].

## 2019-01-17 ENCOUNTER — Encounter: Payer: Self-pay | Admitting: Family Medicine

## 2019-01-17 ENCOUNTER — Other Ambulatory Visit: Payer: Self-pay | Admitting: Vascular Surgery

## 2019-01-17 DIAGNOSIS — N185 Chronic kidney disease, stage 5: Secondary | ICD-10-CM

## 2019-01-17 LAB — BASIC METABOLIC PANEL WITH GFR
BUN/Creatinine Ratio: 10 (ref 9–20)
BUN: 62 mg/dL — ABNORMAL HIGH (ref 6–24)
CO2: 19 mmol/L — ABNORMAL LOW (ref 20–29)
Calcium: 8.1 mg/dL — ABNORMAL LOW (ref 8.7–10.2)
Chloride: 106 mmol/L (ref 96–106)
Creatinine, Ser: 6.32 mg/dL — ABNORMAL HIGH (ref 0.76–1.27)
GFR calc Af Amer: 11 mL/min/1.73 — ABNORMAL LOW
GFR calc non Af Amer: 10 mL/min/1.73 — ABNORMAL LOW
Glucose: 105 mg/dL — ABNORMAL HIGH (ref 65–99)
Potassium: 4.2 mmol/L (ref 3.5–5.2)
Sodium: 141 mmol/L (ref 134–144)

## 2019-01-17 LAB — CBC WITH DIFFERENTIAL/PLATELET
Basophils Absolute: 0 x10E3/uL (ref 0.0–0.2)
Basos: 0 %
EOS (ABSOLUTE): 0.2 x10E3/uL (ref 0.0–0.4)
Eos: 4 %
Hematocrit: 29.1 % — ABNORMAL LOW (ref 37.5–51.0)
Hemoglobin: 9.5 g/dL — ABNORMAL LOW (ref 13.0–17.7)
Immature Grans (Abs): 0 x10E3/uL (ref 0.0–0.1)
Immature Granulocytes: 0 %
Lymphocytes Absolute: 1 x10E3/uL (ref 0.7–3.1)
Lymphs: 17 %
MCH: 30.1 pg (ref 26.6–33.0)
MCHC: 32.6 g/dL (ref 31.5–35.7)
MCV: 92 fL (ref 79–97)
Monocytes Absolute: 0.6 x10E3/uL (ref 0.1–0.9)
Monocytes: 11 %
Neutrophils Absolute: 3.9 x10E3/uL (ref 1.4–7.0)
Neutrophils: 68 %
Platelets: 189 x10E3/uL (ref 150–450)
RBC: 3.16 x10E6/uL — ABNORMAL LOW (ref 4.14–5.80)
RDW: 14.9 % (ref 11.6–15.4)
WBC: 5.7 x10E3/uL (ref 3.4–10.8)

## 2019-01-17 LAB — LIPID PANEL
Chol/HDL Ratio: 4.1 ratio (ref 0.0–5.0)
Cholesterol, Total: 167 mg/dL (ref 100–199)
HDL: 41 mg/dL
LDL Calculated: 109 mg/dL — ABNORMAL HIGH (ref 0–99)
Triglycerides: 83 mg/dL (ref 0–149)
VLDL Cholesterol Cal: 17 mg/dL (ref 5–40)

## 2019-02-17 ENCOUNTER — Other Ambulatory Visit: Payer: Self-pay | Admitting: Gastroenterology

## 2019-02-18 ENCOUNTER — Other Ambulatory Visit (HOSPITAL_COMMUNITY): Payer: Medicaid Other

## 2019-02-18 ENCOUNTER — Encounter: Payer: Medicaid Other | Admitting: Vascular Surgery

## 2019-02-18 ENCOUNTER — Encounter (HOSPITAL_COMMUNITY): Payer: Medicaid Other

## 2019-02-19 ENCOUNTER — Encounter: Payer: Self-pay | Admitting: Family

## 2019-03-17 ENCOUNTER — Other Ambulatory Visit: Payer: Self-pay | Admitting: Vascular Surgery

## 2019-03-17 DIAGNOSIS — N185 Chronic kidney disease, stage 5: Secondary | ICD-10-CM

## 2019-04-03 ENCOUNTER — Encounter: Payer: Medicaid Other | Admitting: Vascular Surgery

## 2019-04-03 ENCOUNTER — Ambulatory Visit (HOSPITAL_COMMUNITY): Admission: RE | Admit: 2019-04-03 | Payer: Medicaid Other | Source: Ambulatory Visit

## 2019-04-03 ENCOUNTER — Ambulatory Visit (HOSPITAL_COMMUNITY): Payer: Medicaid Other

## 2019-04-03 ENCOUNTER — Other Ambulatory Visit (HOSPITAL_COMMUNITY): Payer: Medicaid Other

## 2019-04-15 ENCOUNTER — Ambulatory Visit (HOSPITAL_COMMUNITY): Admit: 2019-04-15 | Payer: Medicaid Other | Admitting: Gastroenterology

## 2019-04-15 ENCOUNTER — Encounter (HOSPITAL_COMMUNITY): Payer: Self-pay

## 2019-04-15 SURGERY — ESOPHAGOGASTRODUODENOSCOPY (EGD) WITH PROPOFOL
Anesthesia: Monitor Anesthesia Care

## 2019-04-18 ENCOUNTER — Ambulatory Visit: Payer: Medicaid Other | Admitting: Family Medicine

## 2019-05-12 ENCOUNTER — Other Ambulatory Visit: Payer: Self-pay | Admitting: Vascular Surgery

## 2019-05-12 DIAGNOSIS — N185 Chronic kidney disease, stage 5: Secondary | ICD-10-CM

## 2019-06-27 DIAGNOSIS — J189 Pneumonia, unspecified organism: Secondary | ICD-10-CM

## 2019-06-27 DIAGNOSIS — D649 Anemia, unspecified: Secondary | ICD-10-CM

## 2019-06-27 DIAGNOSIS — I509 Heart failure, unspecified: Secondary | ICD-10-CM

## 2019-06-27 HISTORY — DX: Heart failure, unspecified: I50.9

## 2019-06-27 HISTORY — DX: Pneumonia, unspecified organism: J18.9

## 2019-06-27 HISTORY — DX: Anemia, unspecified: D64.9

## 2019-07-10 ENCOUNTER — Other Ambulatory Visit (HOSPITAL_COMMUNITY): Payer: Medicaid Other

## 2019-07-10 ENCOUNTER — Encounter (HOSPITAL_COMMUNITY): Payer: Medicaid Other

## 2019-07-10 ENCOUNTER — Encounter: Payer: Medicaid Other | Admitting: Vascular Surgery

## 2019-07-16 ENCOUNTER — Ambulatory Visit: Payer: Medicaid Other | Admitting: Family Medicine

## 2019-07-23 ENCOUNTER — Other Ambulatory Visit: Payer: Self-pay

## 2019-07-23 DIAGNOSIS — N185 Chronic kidney disease, stage 5: Secondary | ICD-10-CM

## 2019-07-24 ENCOUNTER — Encounter: Payer: Medicaid Other | Admitting: Vascular Surgery

## 2019-07-24 ENCOUNTER — Encounter (HOSPITAL_COMMUNITY): Payer: Medicaid Other

## 2019-07-24 ENCOUNTER — Other Ambulatory Visit (HOSPITAL_COMMUNITY): Payer: Medicaid Other

## 2019-07-27 ENCOUNTER — Other Ambulatory Visit: Payer: Self-pay

## 2019-07-27 ENCOUNTER — Encounter (HOSPITAL_COMMUNITY): Payer: Self-pay | Admitting: Emergency Medicine

## 2019-07-27 ENCOUNTER — Emergency Department (HOSPITAL_COMMUNITY): Payer: Medicaid Other

## 2019-07-27 ENCOUNTER — Inpatient Hospital Stay (HOSPITAL_COMMUNITY)
Admission: EM | Admit: 2019-07-27 | Discharge: 2019-08-03 | DRG: 177 | Disposition: A | Discharge status: Home Health | Payer: Medicaid Other | Attending: Internal Medicine | Admitting: Internal Medicine

## 2019-07-27 DIAGNOSIS — I5043 Acute on chronic combined systolic (congestive) and diastolic (congestive) heart failure: Secondary | ICD-10-CM | POA: Diagnosis present

## 2019-07-27 DIAGNOSIS — E872 Acidosis: Secondary | ICD-10-CM | POA: Diagnosis present

## 2019-07-27 DIAGNOSIS — Z8679 Personal history of other diseases of the circulatory system: Secondary | ICD-10-CM | POA: Diagnosis not present

## 2019-07-27 DIAGNOSIS — N185 Chronic kidney disease, stage 5: Secondary | ICD-10-CM | POA: Diagnosis not present

## 2019-07-27 DIAGNOSIS — Z992 Dependence on renal dialysis: Secondary | ICD-10-CM | POA: Diagnosis not present

## 2019-07-27 DIAGNOSIS — H548 Legal blindness, as defined in USA: Secondary | ICD-10-CM | POA: Diagnosis present

## 2019-07-27 DIAGNOSIS — Z8249 Family history of ischemic heart disease and other diseases of the circulatory system: Secondary | ICD-10-CM | POA: Diagnosis not present

## 2019-07-27 DIAGNOSIS — N179 Acute kidney failure, unspecified: Secondary | ICD-10-CM | POA: Diagnosis present

## 2019-07-27 DIAGNOSIS — R066 Hiccough: Secondary | ICD-10-CM | POA: Diagnosis present

## 2019-07-27 DIAGNOSIS — E669 Obesity, unspecified: Secondary | ICD-10-CM | POA: Diagnosis present

## 2019-07-27 DIAGNOSIS — N186 End stage renal disease: Secondary | ICD-10-CM

## 2019-07-27 DIAGNOSIS — I272 Pulmonary hypertension, unspecified: Secondary | ICD-10-CM | POA: Diagnosis present

## 2019-07-27 DIAGNOSIS — D631 Anemia in chronic kidney disease: Secondary | ICD-10-CM

## 2019-07-27 DIAGNOSIS — J45909 Unspecified asthma, uncomplicated: Secondary | ICD-10-CM | POA: Diagnosis present

## 2019-07-27 DIAGNOSIS — I5023 Acute on chronic systolic (congestive) heart failure: Secondary | ICD-10-CM | POA: Diagnosis not present

## 2019-07-27 DIAGNOSIS — Z6829 Body mass index (BMI) 29.0-29.9, adult: Secondary | ICD-10-CM | POA: Diagnosis not present

## 2019-07-27 DIAGNOSIS — I5021 Acute systolic (congestive) heart failure: Secondary | ICD-10-CM

## 2019-07-27 DIAGNOSIS — E785 Hyperlipidemia, unspecified: Secondary | ICD-10-CM

## 2019-07-27 DIAGNOSIS — I132 Hypertensive heart and chronic kidney disease with heart failure and with stage 5 chronic kidney disease, or end stage renal disease: Secondary | ICD-10-CM | POA: Diagnosis present

## 2019-07-27 DIAGNOSIS — E119 Type 2 diabetes mellitus without complications: Secondary | ICD-10-CM

## 2019-07-27 DIAGNOSIS — U071 COVID-19: Principal | ICD-10-CM

## 2019-07-27 DIAGNOSIS — D508 Other iron deficiency anemias: Secondary | ICD-10-CM

## 2019-07-27 DIAGNOSIS — R809 Proteinuria, unspecified: Secondary | ICD-10-CM | POA: Diagnosis not present

## 2019-07-27 DIAGNOSIS — H547 Unspecified visual loss: Secondary | ICD-10-CM | POA: Diagnosis not present

## 2019-07-27 DIAGNOSIS — D509 Iron deficiency anemia, unspecified: Secondary | ICD-10-CM | POA: Diagnosis present

## 2019-07-27 DIAGNOSIS — I509 Heart failure, unspecified: Secondary | ICD-10-CM

## 2019-07-27 DIAGNOSIS — J9601 Acute respiratory failure with hypoxia: Secondary | ICD-10-CM | POA: Diagnosis not present

## 2019-07-27 DIAGNOSIS — Z79899 Other long term (current) drug therapy: Secondary | ICD-10-CM

## 2019-07-27 DIAGNOSIS — J1282 Pneumonia due to coronavirus disease 2019: Secondary | ICD-10-CM | POA: Diagnosis present

## 2019-07-27 DIAGNOSIS — E1129 Type 2 diabetes mellitus with other diabetic kidney complication: Secondary | ICD-10-CM | POA: Diagnosis not present

## 2019-07-27 DIAGNOSIS — I110 Hypertensive heart disease with heart failure: Secondary | ICD-10-CM

## 2019-07-27 DIAGNOSIS — R140 Abdominal distension (gaseous): Secondary | ICD-10-CM | POA: Diagnosis not present

## 2019-07-27 DIAGNOSIS — E1122 Type 2 diabetes mellitus with diabetic chronic kidney disease: Secondary | ICD-10-CM | POA: Diagnosis present

## 2019-07-27 DIAGNOSIS — I5041 Acute combined systolic (congestive) and diastolic (congestive) heart failure: Secondary | ICD-10-CM | POA: Diagnosis not present

## 2019-07-27 DIAGNOSIS — N189 Chronic kidney disease, unspecified: Secondary | ICD-10-CM | POA: Diagnosis present

## 2019-07-27 DIAGNOSIS — R600 Localized edema: Secondary | ICD-10-CM | POA: Diagnosis not present

## 2019-07-27 DIAGNOSIS — R778 Other specified abnormalities of plasma proteins: Secondary | ICD-10-CM | POA: Diagnosis not present

## 2019-07-27 HISTORY — DX: COVID-19: U07.1

## 2019-07-27 HISTORY — DX: Heart failure, unspecified: I50.9

## 2019-07-27 LAB — URINALYSIS, ROUTINE W REFLEX MICROSCOPIC
Bacteria, UA: NONE SEEN
Bilirubin Urine: NEGATIVE
Glucose, UA: 50 mg/dL — AB
Ketones, ur: NEGATIVE mg/dL
Leukocytes,Ua: NEGATIVE
Nitrite: NEGATIVE
Protein, ur: >=300 mg/dL — AB
Specific Gravity, Urine: 1.011 (ref 1.005–1.030)
pH: 5 (ref 5.0–8.0)

## 2019-07-27 LAB — COMPREHENSIVE METABOLIC PANEL
ALT: 18 U/L (ref 0–44)
AST: 23 U/L (ref 15–41)
Albumin: 2.4 g/dL — ABNORMAL LOW (ref 3.5–5.0)
Alkaline Phosphatase: 56 U/L (ref 38–126)
Anion gap: 14 (ref 5–15)
BUN: 68 mg/dL — ABNORMAL HIGH (ref 6–20)
CO2: 18 mmol/L — ABNORMAL LOW (ref 22–32)
Calcium: 7.5 mg/dL — ABNORMAL LOW (ref 8.9–10.3)
Chloride: 105 mmol/L (ref 98–111)
Creatinine, Ser: 9.06 mg/dL — ABNORMAL HIGH (ref 0.61–1.24)
GFR calc Af Amer: 7 mL/min — ABNORMAL LOW (ref >60)
GFR calc non Af Amer: 6 mL/min — ABNORMAL LOW (ref >60)
Glucose, Bld: 164 mg/dL — ABNORMAL HIGH (ref 70–99)
Potassium: 4.2 mmol/L (ref 3.5–5.1)
Sodium: 137 mmol/L (ref 135–145)
Total Bilirubin: 0.5 mg/dL (ref 0.3–1.2)
Total Protein: 5.9 g/dL — ABNORMAL LOW (ref 6.5–8.1)

## 2019-07-27 LAB — TROPONIN I (HIGH SENSITIVITY)
Troponin I (High Sensitivity): 285 ng/L (ref <18)
Troponin I (High Sensitivity): 303 ng/L (ref <18)

## 2019-07-27 LAB — POC SARS CORONAVIRUS 2 AG -  ED: SARS Coronavirus 2 Ag: POSITIVE — AB

## 2019-07-27 LAB — CBC WITH DIFFERENTIAL/PLATELET
Abs Immature Granulocytes: 0.02 10*3/uL (ref 0.00–0.07)
Basophils Absolute: 0 10*3/uL (ref 0.0–0.1)
Basophils Relative: 0 %
Eosinophils Absolute: 0 10*3/uL (ref 0.0–0.5)
Eosinophils Relative: 1 %
HCT: 27.7 % — ABNORMAL LOW (ref 39.0–52.0)
Hemoglobin: 8.9 g/dL — ABNORMAL LOW (ref 13.0–17.0)
Immature Granulocytes: 0 %
Lymphocytes Relative: 10 %
Lymphs Abs: 0.5 10*3/uL — ABNORMAL LOW (ref 0.7–4.0)
MCH: 29.6 pg (ref 26.0–34.0)
MCHC: 32.1 g/dL (ref 30.0–36.0)
MCV: 92 fL (ref 80.0–100.0)
Monocytes Absolute: 0.3 10*3/uL (ref 0.1–1.0)
Monocytes Relative: 6 %
Neutro Abs: 4.1 10*3/uL (ref 1.7–7.7)
Neutrophils Relative %: 83 %
Platelets: 146 10*3/uL — ABNORMAL LOW (ref 150–400)
RBC: 3.01 MIL/uL — ABNORMAL LOW (ref 4.22–5.81)
RDW: 14.1 % (ref 11.5–15.5)
WBC: 4.9 10*3/uL (ref 4.0–10.5)
nRBC: 0 % (ref 0.0–0.2)

## 2019-07-27 LAB — BRAIN NATRIURETIC PEPTIDE: B Natriuretic Peptide: 2665.5 pg/mL — ABNORMAL HIGH (ref 0.0–100.0)

## 2019-07-27 LAB — LACTIC ACID, PLASMA: Lactic Acid, Venous: 1.7 mmol/L (ref 0.5–1.9)

## 2019-07-27 LAB — LIPASE, BLOOD: Lipase: 63 U/L — ABNORMAL HIGH (ref 11–51)

## 2019-07-27 MED ORDER — SODIUM CHLORIDE 0.9 % IV SOLN
1.0000 g | Freq: Once | INTRAVENOUS | Status: AC
  Administered 2019-07-27: 20:00:00 1 g via INTRAVENOUS
  Filled 2019-07-27: qty 10

## 2019-07-27 MED ORDER — ACETAMINOPHEN 325 MG PO TABS
650.0000 mg | ORAL_TABLET | Freq: Once | ORAL | Status: AC | PRN
  Administered 2019-07-27: 19:00:00 650 mg via ORAL
  Filled 2019-07-27: qty 2

## 2019-07-27 MED ORDER — ACETAMINOPHEN 325 MG PO TABS
650.0000 mg | ORAL_TABLET | Freq: Four times a day (QID) | ORAL | Status: DC | PRN
  Administered 2019-07-30: 650 mg via ORAL
  Filled 2019-07-27: qty 2

## 2019-07-27 MED ORDER — ENOXAPARIN SODIUM 30 MG/0.3ML ~~LOC~~ SOLN
30.0000 mg | Freq: Every day | SUBCUTANEOUS | Status: DC
  Administered 2019-07-28: 30 mg via SUBCUTANEOUS
  Filled 2019-07-27: qty 0.3

## 2019-07-27 MED ORDER — SODIUM CHLORIDE 0.9 % IV SOLN
25.0000 mg | Freq: Once | INTRAVENOUS | Status: AC
  Administered 2019-07-27: 25 mg via INTRAVENOUS
  Filled 2019-07-27: qty 1

## 2019-07-27 MED ORDER — FUROSEMIDE 10 MG/ML IJ SOLN
80.0000 mg | Freq: Once | INTRAMUSCULAR | Status: AC
  Administered 2019-07-27: 20:00:00 80 mg via INTRAVENOUS
  Filled 2019-07-27: qty 8

## 2019-07-27 MED ORDER — SODIUM CHLORIDE 0.9 % IV SOLN
500.0000 mg | Freq: Once | INTRAVENOUS | Status: DC
  Filled 2019-07-27: qty 500

## 2019-07-27 MED ORDER — FUROSEMIDE 10 MG/ML IJ SOLN
60.0000 mg | Freq: Every day | INTRAMUSCULAR | Status: DC
  Administered 2019-07-28 – 2019-07-30 (×3): 60 mg via INTRAVENOUS
  Filled 2019-07-27 (×3): qty 6

## 2019-07-27 MED ORDER — INSULIN ASPART 100 UNIT/ML ~~LOC~~ SOLN
0.0000 [IU] | Freq: Three times a day (TID) | SUBCUTANEOUS | Status: DC
  Administered 2019-07-28 – 2019-07-31 (×2): 2 [IU] via SUBCUTANEOUS
  Administered 2019-08-01: 3 [IU] via SUBCUTANEOUS
  Administered 2019-08-01: 1 [IU] via SUBCUTANEOUS
  Administered 2019-08-02: 2 [IU] via SUBCUTANEOUS
  Administered 2019-08-02 – 2019-08-03 (×2): 1 [IU] via SUBCUTANEOUS

## 2019-07-27 MED ORDER — POTASSIUM CHLORIDE CRYS ER 20 MEQ PO TBCR
20.0000 meq | EXTENDED_RELEASE_TABLET | Freq: Once | ORAL | Status: AC
  Administered 2019-07-27: 20:00:00 20 meq via ORAL
  Filled 2019-07-27: qty 1

## 2019-07-27 NOTE — ED Notes (Signed)
Informed DR Tyrone Nine and RN Benjamine Mola pt covid pos

## 2019-07-27 NOTE — ED Provider Notes (Signed)
[facility] EMERGENCY DEPARTMENT Provider Note   CSN: [id number] Arrival date & time: 07/27/19  1819     History Chief Complaint  Patient presents with  . Constipation  . Hypertension  . Leg Swelling    Corey Park is a 49 y.o. male with a history of hypertension, hyperlipidemia T2DM, CHF w/ EF 25-30%, stage V CKD, & anemia who presents to the emergency department with multiple complaints over the past 4 days.  Patient states he has felt poorly with fatigue, generalized weakness, poor appetite with minimal p.o. intake, chills, persistent hiccups, dry cough, shortness of breath, chest pain with hiccups, and constipation.  He states he also has significant swelling in his bilateral lower extremities.  He has had nothing to eat over the past 4 days, only drinking minimal fluids, states he is still putting out urine, no bowel movements but still passing gas, relays that because he has not been eating he has not taken any of his medicines.  Chest discomfort is worse with hiccuping.  Shortness of breath is worse with exertion.  No alleviating factors to his symptoms.  No sick contacts with similar symptoms.  Denies fever, emesis, melena, hematochezia, abdominal pain, or URI symptoms.  HPI     Past Medical History:  Diagnosis Date  . Asthma   . Diabetes mellitus without complication ([place])   . Hypertension   . Renal disorder     Patient Active Problem List   Diagnosis Date Noted  . Anemia in chronic kidney disease (CKD) 12/23/2018  . Fe deficiency anemia 12/23/2018  . Acute renal failure superimposed on stage 5 chronic kidney disease, not on chronic dialysis ([place])   . Symptomatic anemia 12/21/2018  . Chronic systolic heart failure ([place]) 07/03/2018  . Dilated cardiomyopathy ([place]) 07/03/2018  . Elevated troponin   . Chronic kidney disease V 06/17/2018  . Hypomagnesemia 12/13/2016  . Hypertensive urgency 12/11/2016  . AKI (acute kidney injury) ([place]) 12/11/2016  .  Fluid overload 12/11/2016  . HLD (hyperlipidemia) 06/14/2007  . DM (diabetes mellitus), type 2 ([place]) 06/11/2007  . Hypertensive heart disease with CHF (congestive heart failure) ([place]) 06/11/2007    Past Surgical History:  Procedure Laterality Date  . BIOPSY  12/22/2018   Procedure: BIOPSY;  Surgeon: Laurence Spates, MD;  Location: [place];  Service: Endoscopy;;  . ESOPHAGOGASTRODUODENOSCOPY N/A 12/22/2018   Procedure: ESOPHAGOGASTRODUODENOSCOPY (EGD);  Surgeon: Laurence Spates, MD;  Location[facility] ENDOSCOPY;  Service: Endoscopy;  Laterality: N/A;       Family History  Problem Relation Age of Onset  . CAD Mother   . Hypertension Mother   . Diabetes Neg Hx   . Stroke Neg Hx   . Cancer Neg Hx   . Kidney failure Neg Hx     Social History   Tobacco Use  . Smoking status: Never Smoker  . Smokeless tobacco: Never Used  Substance Use Topics  . Alcohol use: Never    Comment: denies  . Drug use: Never    Types: Marijuana    Comment: denies    Home Medications Prior to Admission medications   Medication Sig Start Date End Date Taking? Authorizing Provider  atorvastatin (LIPITOR) 20 MG tablet One pill each evening to lower cholesterol 01/16/19   Fulp, Cammie, MD  carvedilol (COREG) 25 MG tablet Take 1 tablet (25 mg total) by mouth 2 (two) times daily with a meal. 01/16/19 02/15/19  Fulp, Cammie, MD  ferrous sulfate 325 (65 FE) MG tablet Take 1 tablet (  325 mg total) by mouth daily. 01/16/19 02/15/19  Fulp, Cammie, MD  furosemide (LASIX) 80 MG tablet Take 1 tablet (80 mg total) by mouth 2 (two) times daily. 07/05/18   Richardson Dopp T, PA-C  hydrALAZINE (APRESOLINE) 50 MG tablet Take 1 tablet (50 mg total) by mouth 3 (three) times daily. 01/16/19 04/16/19  Fulp, Cammie, MD  pantoprazole (PROTONIX) 40 MG tablet Take 1 tablet (40 mg total) by mouth daily. 02/23/19 03/25/19  Roney Jaffe, MD  pantoprazole (PROTONIX) 40 MG tablet Take 1 tablet (40 mg total) by mouth 2 (two) times daily. 12/24/18  02/22/19  Roney Jaffe, MD    Allergies    Patient has no known allergies.  Review of Systems   Review of Systems  Constitutional: Positive for appetite change, chills and fatigue. Negative for fever.  HENT: Negative for congestion, ear pain and sore throat.        Positive for hiccups.  Respiratory: Positive for cough and shortness of breath.   Cardiovascular: Positive for chest pain and leg swelling.  Gastrointestinal: Positive for constipation. Negative for abdominal pain, anal bleeding, blood in stool, diarrhea and vomiting.  Genitourinary: Negative for dysuria.  Neurological: Positive for weakness (Generalized). Negative for syncope.  All other systems reviewed and are negative.   Physical Exam Updated Vital Signs BP (!) 186/110 (BP Location: Right Arm)   Pulse (!) 112   Temp (!) 102.5 F (39.2 C) (Oral)   Resp 18   SpO2 100%   Physical Exam Vitals and nursing note reviewed.  Constitutional:      Appearance: He is well-developed. He is ill-appearing. He is not toxic-appearing.     Comments: Actively hiccuping throughout assessment.  HENT:     Head: Normocephalic and atraumatic.  Eyes:     General:        Right eye: No discharge.        Left eye: No discharge.     Conjunctiva/sclera: Conjunctivae normal.  Cardiovascular:     Rate and Rhythm: Regular rhythm. Tachycardia present.  Pulmonary:     Effort: No respiratory distress.     Breath sounds: Rales (Bibasilar.) present. No wheezing or rhonchi.  Chest:     Chest wall: Tenderness (Central lower chest wall, patient states this reproduces his chest discomfort with his hiccups) present.  Abdominal:     General: There is no distension.     Palpations: Abdomen is soft.     Tenderness: There is no abdominal tenderness. There is no guarding or rebound.  Musculoskeletal:     Cervical back: Neck supple.     Comments: 3+ symmetric pitting edema to the bilateral lower legs.  Skin:    General: Skin is warm and dry.      Findings: No rash.  Neurological:     Mental Status: He is alert.     Comments: Clear speech.   Psychiatric:        Behavior: Behavior normal.     ED Results / Procedures / Treatments   Labs (all labs ordered are listed, but only abnormal results are displayed) Labs Reviewed  COMPREHENSIVE METABOLIC PANEL - Abnormal; Notable for the following components:      Result Value   CO2 18 (*)    Glucose, Bld 164 (*)    BUN 68 (*)    Creatinine, Ser 9.06 (*)    Calcium 7.5 (*)    Total Protein 5.9 (*)    Albumin 2.4 (*)    GFR calc non Af  Amer 6 (*)    GFR calc Af Amer 7 (*)    All other components within normal limits  CBC WITH DIFFERENTIAL/PLATELET - Abnormal; Notable for the following components:   RBC 3.01 (*)    Hemoglobin 8.9 (*)    HCT 27.7 (*)    Platelets 146 (*)    Lymphs Abs 0.5 (*)    All other components within normal limits  URINALYSIS, ROUTINE W REFLEX MICROSCOPIC - Abnormal; Notable for the following components:   APPearance HAZY (*)    Glucose, UA 50 (*)    Hgb urine dipstick SMALL (*)    Protein, ur >=300 (*)    All other components within normal limits  LIPASE, BLOOD - Abnormal; Notable for the following components:   Lipase 63 (*)    All other components within normal limits  POC SARS CORONAVIRUS 2 AG -  ED - Abnormal; Notable for the following components:   SARS Coronavirus 2 Ag POSITIVE (*)    All other components within normal limits  TROPONIN I (HIGH SENSITIVITY) - Abnormal; Notable for the following components:   Troponin I (High Sensitivity) 285 (*)    All other components within normal limits  CULTURE, BLOOD (ROUTINE X 2)  CULTURE, BLOOD (ROUTINE X 2)  LACTIC ACID, PLASMA  BRAIN NATRIURETIC PEPTIDE  TROPONIN I (HIGH SENSITIVITY)    EKG EKG Interpretation  Date/Time:  Sunday July 27 2019 19:02:08 EST Ventricular Rate:  115 PR Interval:    QRS Duration: 100 QT Interval:  348 QTC Calculation: 482 R Axis:   -20 Text Interpretation:  Sinus tachycardia Borderline left axis deviation Abnormal T, consider ischemia, lateral leads flipped t waves in v6, I avL seen on prior Otherwise no significant change Confirmed by Deno Etienne [phone]) on 07/27/2019 9:17:28 PM   Radiology DG Chest 2 View  Result Date: 07/27/2019 CLINICAL DATA:  Leg swelling and constipation.  Fever. EXAM: CHEST - 2 VIEW COMPARISON:  December 21, 2018 FINDINGS: Cardiomegaly. The hila and mediastinum are normal. No pneumothorax. Mild opacity in the right base is somewhat platelike in appearance. No other infiltrates are noted. No overt edema. IMPRESSION: Platelike opacity in the right base may represent atelectasis but infiltrate such as pneumonia is not excluded. Cardiomegaly. Electronically Signed   By: Dorise Bullion III M.D   On: 07/27/2019 18:57    Procedures Procedures (including critical care time)  Medications Ordered in ED Medications  acetaminophen (TYLENOL) tablet 650 mg (has no administration in time range)    ED Course  I have reviewed the triage vital signs and the nursing notes.  Pertinent labs & imaging results that were available during my care of the patient were reviewed by me and considered in my medical decision making (see chart for details).    Corey Park was evaluated in Emergency Department on 07/27/2019 for the symptoms described in the history of present illness. He/she was evaluated in the context of the global COVID-19 pandemic, which necessitated consideration that the patient might be at risk for infection with the SARS-CoV-2 virus that causes COVID-19. Institutional protocols and algorithms that pertain to the evaluation of patients at risk for COVID-19 are in a state of rapid change based on information released by regulatory bodies including the CDC and federal and state organizations. These policies and algorithms were followed during the patient's care in the ED.  MDM Rules/Calculators/A&P  Patient  presents to the emergency department with multiple complaints including respiratory symptoms, persistent hiccups, poor p.o. intake, and bilateral lower extremity swelling.  On arrival patient is febrile with likely resultant tachycardia.  Blood pressure noted to be elevated.  Bibasilar Rales present on exam.  Reproducible chest discomfort with anterior lower chest wall palpation.  3+ symmetric pitting edema to the lower legs.    CBC: Anemia & thrombocytopenia similar to prior ranges.  No leukocytosis. CMP: Poor renal function fairly similar to prior, GFR 7 today has been 8-11 in the past.  He has a chronic metabolic acidosis.  Hypoalbuminemia.  Hypocalcemia. Lipase: Mildly elevated Urinalysis: No UTI BNP: Elevated more so than prior @ 2,665 EKG: Sinus tachycardia Borderline left axis deviation Abnormal T, consider ischemia, lateral leads flipped t waves in v6, I avL seen on prior Otherwise no significant change Troponin: Elevated to 285, suspect secondary to demand, has previous elevated troponins in the 100s, reproducibility of chest discomfort with anterior chest wall palpation on exam. COVID: Positive.  Chest x-ray: Platelike opacity in the right base may represent atelectasis but infiltrate such as pneumonia is not excluded. Cardiomegaly.  Initially started coverage for CAP given patient met SIRS criteria with suspicion for respiratory infection, rocephin had been started, COVID testing subsequently returned positive therefore azithromycin was discontinued given likely viral. Presentation also consistent with CHF exacerbation, dyspnea, peripheral edema, BNP elevated- start lasix in the ED. Suspect troponin elevation more secondary to demand. Renal function poor but fairly similar to prior, patient reports he continues to make urine. Abdomen nontender w/o peritoneal signs, continues to pass gas feel acute surgical abdominal pathology is less likely. Thorazine for hiccups. Will consult for unassigned  medical admission.   This is a shared visit with supervising physician Dr. Tyrone Nine who has independently evaluated patient & provided guidance in evaluation/management/disposition, in agreement with care   21:33: CONSULT: Discussed with hospitalist Dr. Flossie Buffy- accepts admission.   Final Clinical Impression(s) / ED Diagnoses Final diagnoses:  COVID-19  Acute on chronic congestive heart failure, unspecified heart failure type [facility])    Rx / DC Orders ED Discharge Orders    None       Amaryllis Dyke, PA-C 07/27/19 [place], DO 07/28/19 0005

## 2019-07-27 NOTE — ED Triage Notes (Signed)
Per [facility] pt c/o leg swelling and constipation x 4 days. Along with fever and decreased appetite and not wanting to take home medication. Has not taken his lasix or beta blocker.

## 2019-07-27 NOTE — ED Notes (Signed)
Blood culture # 2 Blue bottle only

## 2019-07-27 NOTE — ED Notes (Signed)
Called lab to add on BNP. ?

## 2019-07-27 NOTE — H&P (Signed)
History and Physical    Corey Park DUK:[id number] DOB: August 12, 1970 DOA: 07/27/2019  PCP: Patient, No Pcp Per  Patient coming from: Home, lives with 49 yo son   I have personally briefly reviewed patient's old medical records in [place]  Chief Complaint: Fatigue and "fluid build up"  HPI: Corey Park is a 49 y.o. 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 intake.  Has not been able to eat for 4 days and has not had a bowel movement.  No nausea, vomiting or diarrhea.  No abdominal pain.  He has also noticed increased lower extremity edema since he stopped taking all of his medication including his Lasix for the past few days.  He has noticed intermittent shortness of breath and has had use more pillow to prop himself up in bed in order to sleep. Has felt hot on and off.  Denies any fever.  No sick contact.  Denies tobacco, alcohol or illicit drug use.  ED Course:  He was febrile up to 102.5 and tachycardic but stable on room air.  WBC of 4.9, hemoglobin stable at 8.9 Creatinine of 9.06 from a baseline of about 8. Lipase mildly elevated at 63. BNP of 2665.5 Troponin of 285. UA was negative.  POC COVID positive.   He was given IV 80 mg of Lasix, 20 mEq of potassium, thorazine and initially was started on Rocephin for possible pneumonia with right lung base opacity but was discontinued after Covid test came back positive.  Review of Systems:  Constitutional: No Weight Change, No Fever ENT/Mouth: No sore throat, No Rhinorrhea Eyes: +blindness Cardiovascular: No Chest Pain, + SOB,  + Orthopnea, No Claudication,+ Edema, No Palpitations Respiratory: + Cough, No Sputum Gastrointestinal: No  Nausea, No Vomiting, No Diarrhea, No Constipation, No Pain Genitourinary: no Urinary Incontinence Musculoskeletal: No Arthralgias, No Myalgias Skin: No Skin Lesions, No Pruritus, Neuro: + Weakness, No Numbness Psych: No Anxiety/Panic, No Depression, + decrease appetite Heme/Lymph: No Bruising, No Bleeding  Past Medical History:  Diagnosis Date  . Asthma   . Diabetes mellitus without complication ([place])   . Hypertension   . Renal disorder     Past Surgical History:  Procedure Laterality Date  . BIOPSY  12/22/2018   Procedure: BIOPSY;  Surgeon: Laurence Spates, MD;  Location: [facility];  Service: Endoscopy;;  . ESOPHAGOGASTRODUODENOSCOPY N/A 12/22/2018   Procedure: ESOPHAGOGASTRODUODENOSCOPY (EGD);  Surgeon: Laurence Spates, MD;  Location: [facility];  Service: Endoscopy;  Laterality: N/A;     reports that he has never smoked. He has never used smokeless tobacco. He reports that he does not drink alcohol or use drugs.  No Known Allergies  Family History  Problem Relation Age of Onset  . CAD Mother   . Hypertension Mother   . Diabetes Neg Hx   . Stroke Neg Hx   . Cancer Neg Hx   . Kidney failure Neg Hx      Prior to Admission medications   Medication Sig Start Date End Date Taking? Authorizing Provider  atorvastatin (LIPITOR) 20 MG tablet One pill each evening to lower cholesterol 01/16/19   Fulp, Cammie, MD  carvedilol (COREG) 25 MG tablet Take 1 tablet (25 mg total) by  mouth 2 (two) times daily with a meal. 01/16/19 02/15/19  Fulp, Cammie, MD  ferrous sulfate 325 (65 FE) MG tablet Take 1 tablet (325 mg total) by mouth daily. 01/16/19 02/15/19  Fulp, Cammie, MD  furosemide (LASIX) 80 MG tablet Take 1 tablet (80 mg total) by mouth 2 (two) times daily. 07/05/18   Richardson Dopp T, PA-C  hydrALAZINE (APRESOLINE) 50 MG tablet Take 1 tablet (50 mg total) by mouth 3 (three) times daily. 01/16/19 04/16/19  Fulp, Cammie, MD  pantoprazole (PROTONIX) 40 MG tablet Take 1 tablet (40 mg total)  by mouth daily. 02/23/19 03/25/19  Roney Jaffe, MD  pantoprazole (PROTONIX) 40 MG tablet Take 1 tablet (40 mg total) by mouth 2 (two) times daily. 12/24/18 02/22/19  Roney Jaffe, MD    Physical Exam: Vitals:   07/27/19 1826 07/27/19 1835  BP: (!) 186/110   Pulse: (!) 112   Resp: 18   Temp: (!) 102.5 F (39.2 C)   TempSrc: Oral   SpO2: 97% 100%    Constitutional: NAD, calm, comfortable non-toxic appearing male sitting at 40 degree incline in bed. Frequent hiccups.  Vitals:   07/27/19 1826 07/27/19 1835  BP: (!) 186/110   Pulse: (!) 112   Resp: 18   Temp: (!) 102.5 F (39.2 C)   TempSrc: Oral   SpO2: 97% 100%   Eyes: PERRL, lids and conjunctivae normal ENMT: Mucous membranes are moist.  Neck: normal, supple Respiratory: Bibasilar crackles but no wheezing. Normal respiratory effort on room air. No accessory muscle use.  Cardiovascular: Regular rate and rhythm, no murmurs / rubs / gallops.  +3 pitting distal pretibial edema.  2+ pedal pulses.  Abdomen: no tenderness, no masses palpated.  Bowel sounds positive.  Musculoskeletal: no clubbing / cyanosis. No joint deformity upper and lower extremities. Good ROM, no contractures. Normal muscle tone.  Skin: no rashes, lesions, ulcers. No induration Neurologic: CN 2-12 grossly intact. Sensation intact. Strength 5/5 in all 4.  Psychiatric: Normal judgment and insight. Alert and oriented x 3. Normal mood.     Labs on Admission: I have personally reviewed following labs and imaging studies  CBC: Recent Labs  Lab 07/27/19 1851  WBC 4.9  NEUTROABS 4.1  HGB 8.9*  HCT 27.7*  MCV 92.0  PLT 673*   Basic Metabolic Panel: Recent Labs  Lab 07/27/19 1851  NA 137  K 4.2  CL 105  CO2 18*  GLUCOSE 164*  BUN 68*  CREATININE 9.06*  CALCIUM 7.5*   GFR: CrCl cannot be calculated (Unknown ideal weight.). Liver Function Tests: Recent Labs  Lab 07/27/19 1851  AST 23  ALT 18  ALKPHOS 56  BILITOT 0.5  PROT 5.9*  ALBUMIN  2.4*   Recent Labs  Lab 07/27/19 1930  LIPASE 63*   No results for input(s): AMMONIA in the last 168 hours. Coagulation Profile: No results for input(s): INR, PROTIME in the last 168 hours. Cardiac Enzymes: No results for input(s): CKTOTAL, CKMB, CKMBINDEX, TROPONINI in the last 168 hours. BNP (last 3 results) No results for input(s): PROBNP in the last 8760 hours. HbA1C: No results for input(s): HGBA1C in the last 72 hours. CBG: No results for input(s): GLUCAP in the last 168 hours. Lipid Profile: No results for input(s): CHOL, HDL, LDLCALC, TRIG, CHOLHDL, LDLDIRECT in the last 72 hours. Thyroid Function Tests: No results for input(s): TSH, T4TOTAL, FREET4, T3FREE, THYROIDAB in the last 72 hours. Anemia Panel: No results for input(s): VITAMINB12, FOLATE, FERRITIN, TIBC, IRON, RETICCTPCT in the  last 72 hours. Urine analysis:    Component Value Date/Time   COLORURINE YELLOW 07/27/2019 1936   APPEARANCEUR HAZY (A) 07/27/2019 1936   LABSPEC 1.011 07/27/2019 1936   PHURINE 5.0 07/27/2019 1936   GLUCOSEU 50 (A) 07/27/2019 1936   HGBUR SMALL (A) 07/27/2019 1936   BILIRUBINUR NEGATIVE 07/27/2019 1936   KETONESUR NEGATIVE 07/27/2019 1936   PROTEINUR >=300 (A) 07/27/2019 1936   NITRITE NEGATIVE 07/27/2019 1936   LEUKOCYTESUR NEGATIVE 07/27/2019 1936    Radiological Exams on Admission: DG Chest 2 View  Result Date: 07/27/2019 CLINICAL DATA:  Leg swelling and constipation.  Fever. EXAM: CHEST - 2 VIEW COMPARISON:  December 21, 2018 FINDINGS: Cardiomegaly. The hila and mediastinum are normal. No pneumothorax. Mild opacity in the right base is somewhat platelike in appearance. No other infiltrates are noted. No overt edema. IMPRESSION: Platelike opacity in the right base may represent atelectasis but infiltrate such as pneumonia is not excluded. Cardiomegaly. Electronically Signed   By: Dorise Bullion III M.D   On: 07/27/2019 18:57    EKG: Independently reviewed.    Assessment/Plan  COVID viral infection Stable on room air. Continuous pulse ox. Maintain O2 > 92%  Monitor inflammatory markers  Acute systolic CHF exacerbation  Due to missed Lasix because he was feeling ill Received IV 80 mg Lasix in the ED. Normally takes P.O. 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 Based on past hx his creatinine has continued to increase.  Suspect this is worsening of his renal function rather than AKI  Will monitor  Avoid nephrotoxic agent  Type 2 diabetes Not on any home regimen Very Sensitive SSI  Hypertension Continue Coreg Has hydralazine TID- will need to touch base with son regarding home meds to resume this.  HLD Continue statin   DVT prophylaxis:.Lovenox Code Status: Full Family Communication: Plan discussed with patient at bedside  disposition Plan: Home with at least 2 midnight stays  Consults called:  Admission status: inpatient on telemetry  [facility]   If 7PM-7AM, please contact night-coverage [email or web address]   07/27/2019, 9:54 PM

## 2019-07-28 ENCOUNTER — Inpatient Hospital Stay (HOSPITAL_COMMUNITY): Payer: Medicaid Other

## 2019-07-28 DIAGNOSIS — R600 Localized edema: Secondary | ICD-10-CM

## 2019-07-28 DIAGNOSIS — I132 Hypertensive heart and chronic kidney disease with heart failure and with stage 5 chronic kidney disease, or end stage renal disease: Secondary | ICD-10-CM

## 2019-07-28 DIAGNOSIS — R778 Other specified abnormalities of plasma proteins: Secondary | ICD-10-CM

## 2019-07-28 DIAGNOSIS — E1122 Type 2 diabetes mellitus with diabetic chronic kidney disease: Secondary | ICD-10-CM

## 2019-07-28 DIAGNOSIS — I5021 Acute systolic (congestive) heart failure: Secondary | ICD-10-CM

## 2019-07-28 DIAGNOSIS — H547 Unspecified visual loss: Secondary | ICD-10-CM

## 2019-07-28 DIAGNOSIS — I5041 Acute combined systolic (congestive) and diastolic (congestive) heart failure: Secondary | ICD-10-CM

## 2019-07-28 DIAGNOSIS — I5023 Acute on chronic systolic (congestive) heart failure: Secondary | ICD-10-CM

## 2019-07-28 DIAGNOSIS — R140 Abdominal distension (gaseous): Secondary | ICD-10-CM

## 2019-07-28 DIAGNOSIS — U071 COVID-19: Principal | ICD-10-CM

## 2019-07-28 LAB — FERRITIN: Ferritin: 44 ng/mL (ref 24–336)

## 2019-07-28 LAB — GLUCOSE, CAPILLARY
Glucose-Capillary: 215 mg/dL — ABNORMAL HIGH (ref 70–99)
Glucose-Capillary: 139 mg/dL — ABNORMAL HIGH (ref 70–99)
Glucose-Capillary: 99 mg/dL (ref 70–99)
Glucose-Capillary: 134 mg/dL — ABNORMAL HIGH (ref 70–99)

## 2019-07-28 LAB — COMPREHENSIVE METABOLIC PANEL
ALT: 18 U/L (ref 0–44)
AST: 23 U/L (ref 15–41)
Albumin: 2.3 g/dL — ABNORMAL LOW (ref 3.5–5.0)
Alkaline Phosphatase: 51 U/L (ref 38–126)
Anion gap: 13 (ref 5–15)
BUN: 68 mg/dL — ABNORMAL HIGH (ref 6–20)
CO2: 18 mmol/L — ABNORMAL LOW (ref 22–32)
Calcium: 7.5 mg/dL — ABNORMAL LOW (ref 8.9–10.3)
Chloride: 107 mmol/L (ref 98–111)
Creatinine, Ser: 9.02 mg/dL — ABNORMAL HIGH (ref 0.61–1.24)
GFR calc Af Amer: 7 mL/min — ABNORMAL LOW (ref >60)
GFR calc non Af Amer: 6 mL/min — ABNORMAL LOW (ref >60)
Glucose, Bld: 101 mg/dL — ABNORMAL HIGH (ref 70–99)
Potassium: 4.2 mmol/L (ref 3.5–5.1)
Sodium: 138 mmol/L (ref 135–145)
Total Bilirubin: 0.6 mg/dL (ref 0.3–1.2)
Total Protein: 5.5 g/dL — ABNORMAL LOW (ref 6.5–8.1)

## 2019-07-28 LAB — TROPONIN I (HIGH SENSITIVITY)
Troponin I (High Sensitivity): 314 ng/L (ref <18)
Troponin I (High Sensitivity): 328 ng/L (ref <18)

## 2019-07-28 LAB — ECHOCARDIOGRAM LIMITED
Height: 74 in
Weight: 3844.82 oz

## 2019-07-28 LAB — C-REACTIVE PROTEIN: CRP: 1.5 mg/dL — ABNORMAL HIGH (ref <1.0)

## 2019-07-28 LAB — D-DIMER, QUANTITATIVE: D-Dimer, Quant: 1.06 ug/mL-FEU — ABNORMAL HIGH (ref 0.00–0.50)

## 2019-07-28 LAB — HEMOGLOBIN A1C
Hgb A1c MFr Bld: 6 % — ABNORMAL HIGH (ref 4.8–5.6)
Mean Plasma Glucose: 125.5 mg/dL

## 2019-07-28 LAB — MAGNESIUM: Magnesium: 1.6 mg/dL — ABNORMAL LOW (ref 1.7–2.4)

## 2019-07-28 MED ORDER — SODIUM CHLORIDE 0.9 % IV SOLN: 100.0000 mL | INTRAVENOUS | Status: DC | PRN

## 2019-07-28 MED ORDER — HEPARIN SODIUM (PORCINE) 1000 UNIT/ML DIALYSIS
1000.0000 [IU] | INTRAMUSCULAR | Status: DC | PRN
  Administered 2019-08-02: 3800 [IU] via INTRAVENOUS_CENTRAL

## 2019-07-28 MED ORDER — PENTAFLUOROPROP-TETRAFLUOROETH EX AERO: 1.0000 "application " | INHALATION_SPRAY | CUTANEOUS | Status: DC | PRN

## 2019-07-28 MED ORDER — BISACODYL 5 MG PO TBEC: 5.0000 mg | DELAYED_RELEASE_TABLET | Freq: Every day | ORAL | Status: DC | PRN

## 2019-07-28 MED ORDER — SENNOSIDES-DOCUSATE SODIUM 8.6-50 MG PO TABS
2.0000 | ORAL_TABLET | Freq: Two times a day (BID) | ORAL | Status: DC
  Administered 2019-07-28 – 2019-08-02 (×4): 2 via ORAL
  Filled 2019-07-28 (×10): qty 2

## 2019-07-28 MED ORDER — POLYETHYLENE GLYCOL 3350 17 G PO PACK
17.0000 g | PACK | Freq: Every day | ORAL | Status: DC
  Administered 2019-07-28: 17 g via ORAL
  Filled 2019-07-28 (×5): qty 1

## 2019-07-28 MED ORDER — LIDOCAINE HCL (PF) 1 % IJ SOLN
5.0000 mL | INTRAMUSCULAR | Status: DC | PRN
  Filled 2019-07-28 (×2): qty 5

## 2019-07-28 MED ORDER — LIDOCAINE-PRILOCAINE 2.5-2.5 % EX CREA: 1.0000 "application " | TOPICAL_CREAM | CUTANEOUS | Status: DC | PRN

## 2019-07-28 MED ORDER — ALTEPLASE 2 MG IJ SOLR: 2.0000 mg | Freq: Once | INTRAMUSCULAR | Status: DC | PRN

## 2019-07-28 MED ORDER — CHLORHEXIDINE GLUCONATE CLOTH 2 % EX PADS
6.0000 | MEDICATED_PAD | Freq: Every day | CUTANEOUS | Status: DC
  Administered 2019-07-29 – 2019-08-01 (×4): 6 via TOPICAL

## 2019-07-28 MED ORDER — ATORVASTATIN CALCIUM 10 MG PO TABS
20.0000 mg | ORAL_TABLET | Freq: Every day | ORAL | Status: DC
  Administered 2019-07-28 – 2019-08-02 (×6): 20 mg via ORAL
  Filled 2019-07-28 (×6): qty 2

## 2019-07-28 MED ORDER — FERROUS SULFATE 325 (65 FE) MG PO TABS
325.0000 mg | ORAL_TABLET | Freq: Every day | ORAL | Status: DC
  Administered 2019-07-28 – 2019-08-03 (×7): 325 mg via ORAL
  Filled 2019-07-28 (×7): qty 1

## 2019-07-28 MED ORDER — CARVEDILOL 25 MG PO TABS
25.0000 mg | ORAL_TABLET | Freq: Two times a day (BID) | ORAL | Status: DC
  Administered 2019-07-28 – 2019-08-03 (×12): 25 mg via ORAL
  Filled 2019-07-28 (×12): qty 1

## 2019-07-28 NOTE — Consult Note (Signed)
[facility]  HISTORY AND PHYSICAL  Edwards Mckelvie is an 49 y.o. male.    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 Karleen Hampshire for eval and recs re: AKI on CKD.    Pt started to feel poorly about 4-5 days ago, has been hiccuping the whole time.  Has stopped taking all meds including Lasix.  Came to ED and was found to be COVID +.  Cr up to 9.0.  In this setting we are asked to see.  Pt notes that he has been told he's got CKD V before but can't remember who he sees.  He has had contact with our practice while we've been consulted here in the hospital.  Was set up with AVF c/s with Dr. Oneida Alar in Sept and Nov but didn't complete appt.  He works from [employer] and wants to keep job.  Notes hiccups, dysgeusia, fatigue.  PMH: Past Medical History:  Diagnosis Date  . Asthma   . Diabetes mellitus without complication ([place])   . Hypertension   . Renal disorder    PSH: Past Surgical History:  Procedure Laterality Date  . BIOPSY  12/22/2018   Procedure: BIOPSY;  Surgeon: Laurence Spates, MD;  Location: [facility];  Service: Endoscopy;;  . ESOPHAGOGASTRODUODENOSCOPY N/A 12/22/2018   Procedure: ESOPHAGOGASTRODUODENOSCOPY (EGD);  Surgeon: Laurence Spates, MD;  Location: [facility] ENDOSCOPY;  Service: Endoscopy;  Laterality: N/A;    Past Medical History:  Diagnosis Date  . Asthma   . Diabetes mellitus without complication ([place])   . Hypertension   . Renal disorder     Medications:   Scheduled: . atorvastatin  20 mg Oral q1800  . carvedilol  25 mg Oral BID WC  . enoxaparin (LOVENOX) injection  30 mg Subcutaneous Daily  . ferrous sulfate  325 mg Oral Daily  . furosemide  60 mg Intravenous Daily  . insulin aspart  0-6 Units Subcutaneous TID WC  . polyethylene glycol  17 g Oral Daily  . senna-docusate  2 tablet Oral BID    Medications Prior to  Admission  Medication Sig Dispense Refill  . atorvastatin (LIPITOR) 20 MG tablet One pill each evening to lower cholesterol 30 tablet 6  . carvedilol (COREG) 25 MG tablet Take 1 tablet (25 mg total) by mouth 2 (two) times daily with a meal. 60 tablet 3  . ferrous sulfate 325 (65 FE) MG tablet Take 1 tablet (325 mg total) by mouth daily. 30 tablet 3  . furosemide (LASIX) 80 MG tablet Take 1 tablet (80 mg total) by mouth 2 (two) times daily. 90 tablet 3  . hydrALAZINE (APRESOLINE) 50 MG tablet Take 1 tablet (50 mg total) by mouth 3 (three) times daily. 270 tablet 3  . pantoprazole (PROTONIX) 40 MG tablet Take 1 tablet (40 mg total) by mouth daily. 30 tablet 3  . pantoprazole (PROTONIX) 40 MG tablet Take 1 tablet (40 mg total) by mouth 2 (two) times daily. 120 tablet 0    ALLERGIES:  No Known Allergies  FAM HX: Family History  Problem Relation Age of Onset  . CAD Mother   . Hypertension Mother   . Diabetes Neg Hx   . Stroke Neg Hx   . Cancer Neg Hx   . Kidney failure Neg Hx     Social History:  reports that he has never smoked. He has never used smokeless tobacco. He reports that he does not drink alcohol or use drugs.  ROS: ROS: all systems reviewed and are negative except as per HPI  Blood pressure 120/86, pulse 76, temperature 98.6 F (37 C), resp. rate 15, height '6\' 2"'$  (1.88 m), weight 109 kg, SpO2 98 %. PHYSICAL EXAM: Physical Exam  GEN huddled under blankets HEENT EOMI PERRL wearing mask NECK  No JVD PULM normal WOB, clear bilaterally no c/w/r CV RRR no m/r/g ABD soft, nontender, NABS EXT no LE edema NEURO AAO x 3 no asterixis SKIN no rashes   Results for orders placed or performed during the hospital encounter of 07/27/19 (from the past 48 hour(s))  Lactic acid, plasma     Status: None   Collection Time: 07/27/19  6:51 PM  Result Value Ref Range   Lactic Acid, Venous 1.7 0.5 - 1.9 mmol/L    Comment: Performed at [facility], [address],  [place], [place] [address]  Comprehensive metabolic panel     Status: Abnormal   Collection Time: 07/27/19  6:51 PM  Result Value Ref Range   Sodium 137 135 - 145 mmol/L   Potassium 4.2 3.5 - 5.1 mmol/L   Chloride 105 98 - 111 mmol/L   CO2 18 (L) 22 - 32 mmol/L   Glucose, Bld 164 (H) 70 - 99 mg/dL   BUN 68 (H) 6 - 20 mg/dL   Creatinine, Ser 9.06 (H) 0.61 - 1.24 mg/dL   Calcium 7.5 (L) 8.9 - 10.3 mg/dL   Total Protein 5.9 (L) 6.5 - 8.1 g/dL   Albumin 2.4 (L) 3.5 - 5.0 g/dL   AST 23 15 - 41 U/L   ALT 18 0 - 44 U/L   Alkaline Phosphatase 56 38 - 126 U/L   Total Bilirubin 0.5 0.3 - 1.2 mg/dL   GFR calc non Af Amer 6 (L) >60 mL/min   GFR calc Af Amer 7 (L) >60 mL/min   Anion gap 14 5 - 15    Comment: Performed at [facility], [place] [address], [place], [place] [address]  CBC with Differential     Status: Abnormal   Collection Time: 07/27/19  6:51 PM  Result Value Ref Range   WBC 4.9 4.0 - 10.5 K/uL   RBC 3.01 (L) 4.22 - 5.81 MIL/uL   Hemoglobin 8.9 (L) 13.0 - 17.0 g/dL   HCT 27.7 (L) 39.0 - 52.0 %   MCV 92.0 80.0 - 100.0 fL   MCH 29.6 26.0 - 34.0 pg   MCHC 32.1 30.0 - 36.0 g/dL   RDW 14.1 11.5 - 15.5 %   Platelets 146 (L) 150 - 400 K/uL   nRBC 0.0 0.0 - 0.2 %   Neutrophils Relative % 83 %   Neutro Abs 4.1 1.7 - 7.7 K/uL   Lymphocytes Relative 10 %   Lymphs Abs 0.5 (L) 0.7 - 4.0 K/uL   Monocytes Relative 6 %   Monocytes Absolute 0.3 0.1 - 1.0 K/uL   Eosinophils Relative 1 %   Eosinophils Absolute 0.0 0.0 - 0.5 K/uL   Basophils Relative 0 %   Basophils Absolute 0.0 0.0 - 0.1 K/uL   Immature Granulocytes 0 %   Abs Immature Granulocytes 0.02 0.00 - 0.07 K/uL    Comment: Performed at [facility], [place] [address] [place], [place] [address]  Brain natriuretic peptide     Status: Abnormal   Collection  Time: 07/27/19  6:51 PM  Result Value Ref Range   B Natriuretic Peptide 2,665.5 (H) 0.0 - 100.0 pg/mL    Comment: Performed at [facility] [address],  [place], [place] [address]  Blood culture (routine x 2)     Status: None (Preliminary result)   Collection Time: 07/27/19  7:30 PM   Specimen: BLOOD  Result Value Ref Range   Specimen Description BLOOD RIGHT ANTECUBITAL    Special Requests      BOTTLES DRAWN AEROBIC AND ANAEROBIC Blood Culture results may not be optimal due to an inadequate volume of blood received in culture bottles   Culture      NO GROWTH < 24 HOURS Performed at [facility] [address] [place], [place] [address]    Report Status PENDING   Lipase, blood     Status: Abnormal   Collection Time: 07/27/19  7:30 PM  Result Value Ref Range   Lipase 63 (H) 11 - 51 U/L    Comment: Performed at [facility] [address], [place], [place] [address]  Troponin I (High Sensitivity)     Status: Abnormal   Collection Time: 07/27/19  7:30 PM  Result Value Ref Range   Troponin I (High Sensitivity) 285 (HH) <18 ng/L    Comment: CRITICAL RESULT CALLED TO, READ BACK BY AND VERIFIED WITH: Bonney Aid 07/27/19 2042 WAYK Performed at [facility], [place] [address] [place], [place] [address]   POC SARS Coronavirus 2 Ag-ED - Nasal Swab (BD Veritor Kit)     Status: Abnormal   Collection Time: 07/27/19  7:35 PM  Result Value Ref Range   SARS Coronavirus 2 Ag POSITIVE (A) NEGATIVE    Comment: (NOTE) SARS-CoV-2 antigen PRESENT. Positive results indicate the presence of viral antigens, but clinical correlation with patient history and other diagnostic information is necessary to determine patient infection status.  Positive results do not rule out bacterial infection or co-infection  with other viruses. False positive results are rare but can occur, and confirmatory RT-PCR testing may be appropriate in some circumstances. The expected result is Negative. Fact Sheet for Patients: PodPark.tn Fact Sheet for Providers: GiftContent.is  This test is not yet approved  or cleared by the [place] FDA and  has been authorized for detection and/or diagnosis of SARS-CoV-2 by FDA under an Emergency Use Authorization (EUA).  This EUA will remain in effect (meaning this test can be used) for the duration of  the COVID-19 declaration under Section 564(b)(1) of the Act, 21 U.S.C. section 360bbb-3(b)(1), unless the a uthorization is terminated or revoked sooner.   Urinalysis, Routine w reflex microscopic     Status: Abnormal   Collection Time: 07/27/19  7:36 PM  Result Value Ref Range   Color, Urine YELLOW YELLOW   APPearance HAZY (A) CLEAR   Specific Gravity, Urine 1.011 1.005 - 1.030   pH 5.0 5.0 - 8.0   Glucose, UA 50 (A) NEGATIVE mg/dL   Hgb urine dipstick SMALL (A) NEGATIVE   Bilirubin Urine NEGATIVE NEGATIVE   Ketones, ur NEGATIVE NEGATIVE mg/dL   Protein, ur >=300 (A) NEGATIVE mg/dL   Nitrite NEGATIVE NEGATIVE   Leukocytes,Ua NEGATIVE NEGATIVE   RBC / HPF 0-5 0 - 5 RBC/hpf   WBC, UA 0-5 0 - 5 WBC/hpf   Bacteria, UA NONE SEEN NONE SEEN   Squamous Epithelial / LPF 0-5 0 - 5    Comment: Performed at [facility], [address], [place], [place] [address]  Blood culture (routine x 2)     Status: None (Preliminary result)   Collection Time: 07/27/19  7:57 PM   Specimen: BLOOD RIGHT HAND  Result Value Ref Range   Specimen Description BLOOD RIGHT HAND    Special Requests      BOTTLES DRAWN AEROBIC ONLY Blood Culture results may not be optimal due to an inadequate volume of blood received in culture bottles   Culture      NO GROWTH < 24 HOURS Performed at [facility] [address] [place] [address], [place], [place] [address]    Report Status PENDING   Troponin I (High Sensitivity)     Status: Abnormal   Collection Time: 07/27/19  9:10 PM  Result Value Ref Range   Troponin I (High Sensitivity) 303 (HH) <18 ng/L    Comment: CRITICAL VALUE NOTED.  VALUE IS CONSISTENT WITH PREVIOUSLY REPORTED AND CALLED VALUE. Performed at [facility],  [place] [address], [place], [place] [address]   Troponin I (High Sensitivity)     Status: Abnormal   Collection Time: 07/28/19 12:06 AM  Result Value Ref Range   Troponin I (High Sensitivity) 328 (HH) <18 ng/L    Comment: CRITICAL VALUE NOTED.  VALUE IS CONSISTENT WITH PREVIOUSLY REPORTED AND CALLED VALUE. Performed at [facility], [place] [address] [place], [place] [address]   Hemoglobin A1c     Status: Abnormal   Collection Time: 07/28/19 12:06 AM  Result Value Ref Range   Hgb A1c MFr Bld 6.0 (H) 4.8 - 5.6 %    Comment: (NOTE) Pre diabetes:          5.7%-6.4% Diabetes:              >6.4% Glycemic control for   <7.0% adults with diabetes    Mean Plasma Glucose 125.5 mg/dL    Comment: Performed at [facility] [address], [place], [place] [address]  C-reactive protein     Status: Abnormal   Collection Time: 07/28/19  2:03 AM  Result Value Ref Range   CRP 1.5 (H) <1.0 mg/dL    Comment: Performed at [facility] [address], [place], [place] [address]  D-dimer, quantitative (not at [facility])     Status: Abnormal   Collection Time: 07/28/19  2:03 AM  Result Value Ref Range   D-Dimer, Quant 1.06 (H) 0.00 - 0.50 ug/mL-FEU    Comment: (NOTE) At the manufacturer cut-off of 0.50 ug/mL FEU, this assay has been documented to exclude PE with a sensitivity and negative predictive value of 97 to 99%.  At this time, this assay has not been approved by the FDA to exclude DVT/VTE. Results should be correlated with clinical presentation. Performed at [facility], [place] [address], [place], [place] [address]   Magnesium     Status: Abnormal   Collection Time: 07/28/19  2:03 AM  Result Value Ref Range   Magnesium 1.6 (L) 1.7 - 2.4 mg/dL    Comment: Performed at [facility] [address], [place], [place] [address]  Ferritin     Status: None   Collection Time: 07/28/19  2:03 AM  Result Value Ref Range   Ferritin 44 24 - 336 ng/mL    Comment: Performed at [facility], [place] [address] [place], [place] [address]  Troponin I (High Sensitivity)     Status: Abnormal   Collection  Time: 07/28/19  2:03 AM  Result Value Ref Range   Troponin I (High Sensitivity) 314 (HH) <18 ng/L    Comment: CRITICAL VALUE NOTED.  VALUE IS CONSISTENT WITH PREVIOUSLY REPORTED AND CALLED VALUE. Performed at [facility], [place] [address] [place], [place] [address]   Comprehensive metabolic panel     Status: Abnormal   Collection Time: 07/28/19  2:03 AM  Result Value Ref Range   Sodium 138 135 - 145 mmol/L   Potassium 4.2 3.5 - 5.1 mmol/L   Chloride 107 98 - 111 mmol/L   CO2 18 (L) 22 - 32 mmol/L   Glucose, Bld 101 (H) 70 - 99 mg/dL   BUN 68 (H) 6 - 20 mg/dL   Creatinine, Ser 9.02 (H) 0.61 - 1.24 mg/dL   Calcium 7.5 (L) 8.9 - 10.3 mg/dL   Total Protein 5.5 (L) 6.5 - 8.1 g/dL   Albumin 2.3 (L) 3.5 - 5.0 g/dL   AST 23 15 - 41 U/L   ALT 18 0 - 44 U/L   Alkaline Phosphatase 51 38 - 126 U/L   Total Bilirubin 0.6 0.3 - 1.2 mg/dL   GFR calc non Af Amer 6 (L) >60 mL/min   GFR calc Af Amer 7 (L) >60 mL/min   Anion gap 13 5 - 15    Comment: Performed at [facility], [place] [address] [place], [place] [address]  Glucose, capillary     Status: Abnormal   Collection Time: 07/28/19  8:51 AM  Result Value Ref Range   Glucose-Capillary 215 (H) 70 - 99 mg/dL  Glucose, capillary     Status: Abnormal   Collection Time: 07/28/19 11:53 AM  Result Value Ref Range   Glucose-Capillary 139 (H) 70 - 99 mg/dL    DG Chest 2 View  Result Date: 07/27/2019 CLINICAL DATA:  Leg swelling and constipation.  Fever. EXAM: CHEST - 2 VIEW COMPARISON:  December 21, 2018 FINDINGS: Cardiomegaly. The hila and mediastinum are normal. No pneumothorax. Mild opacity in the right base is somewhat platelike in appearance. No other infiltrates are noted. No overt edema. IMPRESSION: Platelike opacity in the right base may represent atelectasis but infiltrate such as pneumonia is not excluded.  Cardiomegaly. Electronically Signed   By: Dorise Bullion III M.D   On: 07/27/2019 18:57   ECHOCARDIOGRAM LIMITED  Result Date: 07/28/2019   ECHOCARDIOGRAM LIMITED REPORT   Patient Name:   NAKUL AVINO Date of Exam: 07/28/2019 Medical Rec #:  [id number]       Height:       74.0 in Accession #:    [id number]      Weight:       240.3 lb Date of Birth:  Apr 29, 1971       BSA:          2.35 m Patient Age:    25 years        BP:           120/86 mmHg Patient Gender: M               HR:           76 bpm. Exam Location:  Inpatient  Procedure: Limited Echo, Cardiac Doppler and Color Doppler Indications:    CHF  History:        Patient has prior history of Echocardiogram examinations, most                 recent 06/17/2018. Covid positive, CKD.  Sonographer:    Dustin Flock Referring Phys: [id number] [place]  1. Left ventricular ejection fraction, by visual estimation, is 20 to 25%. The left ventricle has severely decreased function. There is severely increased left ventricular wall thickness.  2. Elevated left atrial pressure.  3. Left ventricular diastolic parameters are consistent with Grade II diastolic dysfunction (pseudonormalization).  4. Mildly dilated left ventricular internal cavity size.  5. The left ventricle demonstrates global hypokinesis.  6. Global right ventricle has severely reduced systolic function.The right ventricular size is normal.  7. Trivial pericardial effusion is present.  8. The mitral valve is normal in structure. Trivial mitral valve regurgitation. No evidence of mitral stenosis.  9. The tricuspid valve was normal in structure. Tricuspid valve regurgitation is mild. 10. Mild aortic valve sclerosis without stenosis. 11. The pulmonic valve was normal in structure. Pulmonic valve regurgitation is mild by color flow Doppler. 12. Aortic dilatation noted. 13. There is mild dilatation of the aortic root measuring 39 mm. 14. Moderately elevated pulmonary artery systolic pressure. 15.  The inferior vena cava is dilated in size with <50% respiratory variability, suggesting right atrial pressure of 15 mmHg. 16. Severe global reduction in LV systolic function; severe LVH; mild LVE; severe RV dysfunction; mild TR; moderate pulmonary hypertension. FINDINGS  Left Ventricle: Left ventricular ejection fraction, by visual estimation, is 20 to 25%. The left ventricle has severely decreased function. The left ventricle demonstrates global hypokinesis. The left ventricular internal cavity size was mildly dilated left ventricle. There is severely increased left ventricular wall thickness. Left ventricular diastolic parameters are consistent with Grade II diastolic dysfunction (pseudonormalization). Elevated left atrial pressure. Right Ventricle: The right ventricular size is normal.Global RV systolic function is has severely reduced systolic function. The tricuspid regurgitant velocity is 2.94 m/s, and with an assumed right atrial pressure of 8 mmHg, the estimated right ventricular systolic pressure is moderately elevated at 42.5 mmHg. Left Atrium: Left atrial size was normal in size. Right Atrium: Right atrial size was normal in size. Right atrial pressure is estimated at 8 mmHg. Pericardium: Trivial pericardial effusion is present is seen. Trivial pericardial effusion is present. Mitral Valve: The mitral valve is normal in structure. There is mild thickening of the mitral valve leaflet(s). No evidence of mitral valve stenosis by observation. MV Area by PHT, 4.49 cm. MV PHT, 49.01 msec. Trivial mitral valve regurgitation. Tricuspid Valve: The tricuspid valve is normal in structure. Tricuspid valve regurgitation is mild. Aortic Valve: The aortic valve is tricuspid. Aortic valve regurgitation is not visualized. Mild aortic valve sclerosis is present, with no evidence of aortic valve stenosis. Pulmonic Valve: The pulmonic valve was normal in structure. Pulmonic valve regurgitation is mild by color flow Doppler.  Pulmonic regurgitation is mild by color flow Doppler. Aorta: Aortic dilatation noted. There is mild dilatation of the aortic root measuring 39 mm. Venous: The inferior vena cava is dilated in size with less than 50% respiratory variability, suggesting right atrial pressure of 15 mmHg. Shunts: There is no evidence of a patent foramen ovale.  Additional Comments: Severe global reduction in LV systolic function; severe LVH; mild LVE; severe RV dysfunction; mild TR; moderate pulmonary hypertension.  LEFT VENTRICLE          Normals PLAX 2D LVIDd:         5.92 cm  3.6 cm   Diastology                 Normals LVIDs:  4.48 cm  1.7 cm   LV e' lateral:   4.70 cm/s 6.42 cm/s LV PW:         1.53 cm  1.4 cm   LV E/e' lateral: 12.4      15.4 LV IVS:        1.72 cm  1.3 cm   LV e' medial:    3.24 cm/s 6.96 cm/s LVOT diam:     2.20 cm  2.0 cm   LV E/e' medial:  18.0      6.96 LV SV:         83 ml    79 ml LV SV Index:   34.46    45 ml/m2 LVOT Area:     3.80 cm 3.14 cm2  LEFT ATRIUM         Index LA diam:    3.90 cm 1.66 cm/m  AORTIC VALVE             Normals LVOT Vmax:   79.60 cm/s LVOT Vmean:  48.000 cm/s 75 cm/s LVOT VTI:    0.130 m     25.3 cm  AORTA                 Normals Ao Root diam: 3.90 cm 31 mm MITRAL VALVE              Normals   TRICUSPID VALVE             Normals MV Area (PHT): 4.49 cm             TR Peak grad:   34.5 mmHg MV PHT:        49.01 msec 55 ms     TR Vmax:        330.00 cm/s 288 cm/s MV Decel Time: 169 msec   187 ms MV E velocity: 58.30 cm/s 103 cm/s  SHUNTS MV A velocity: 38.10 cm/s 70.3 cm/s Systemic VTI:  0.13 m MV E/A ratio:  1.53       1.5       Systemic Diam: 2.20 cm  Kirk Ruths MD Electronically signed by Kirk Ruths MD Signature Date/Time: 07/28/2019/2:52:57 PMThe mitral valve is normal in structure.    Final     Assessment/Plan  1.  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  Will place order for [facility] and first HD.  CLIP will start (have d/w renal navigator)  2.  COVID:  per primary  3.  HTN: reasonably well-controlled  4.  Anemia: ESA and iron as appropriate  5.  CKD-MB: binders  6.  Dispo: pending  Madelon Lips 07/28/2019, 5:00 PM

## 2019-07-28 NOTE — Progress Notes (Signed)
  Echocardiogram 2D Echocardiogram has been performed.  Corey Park 07/28/2019, 2:23 PM

## 2019-07-28 NOTE — Consult Note (Addendum)
Cardiology Consultation:   Patient ID: Corey Park MRN: [id number]; DOB: [birth year]  Admit date: 07/27/2019 Date of Consult: 07/28/2019  Primary Care Provider: Patient, No Pcp Per Primary Cardiologist: Candee Furbish, MD Primary Electrophysiologist:  None    Patient Profile:   Corey Park is a 49 y.o. male with a hx of blindness, systolic heart failure (EF 25 to 30% 05/2018), hypertension, type 2 diabetes, CKD stage 5 (not on hemodialysis), hyperlipidemia, anemia who is being seen today for the evaluation of acute heart failure at the request of Dr. Karleen Hampshire.  History of Present Illness:   This was a phone conducted interview given COVID positive status.  Corey Park is followed by Dr. Marlou Porch. He was first seen back in 2018 during an admission for hypertensive emergency.  Echo at that time showed EF of 55 to 60% with no wall motion abnormalities.  He was seen again in 05/2018 for heart failure admission.  At that time showed EF of 25 to 30%, diffuse hypokinesis, moderate LVH, mild MR, moderately dilated LA. Baseline weight at discharge was 230 lbs. He was unable to undergo cardiac catheterization as he was not yet on dialysis. He was last seen 07/03/2018 for hospitalization follow-up and was doing well from a cardiac standpoint. He was also seen by nephrology who planned to see his as outpatient but am unsure if he followed-up.  Patient was taking Lasix 80 twice a day for volume control. He is not too sure of his medications since he is blind and his son manages these.  Patient presented to the ED 07/27/2019 for constipation, hypertension, leg swelling.  He reported over the last 4 days he was feeling generally poor with fatigue, weakness, and decreased appetite.  Reported significant swelling in his lower extremities and shortness of breath on exertion.  He said he is still putting out urine. Patient said he missed a Lasix dose due to feeling generally ill. Denies recent weight gain. Stomach is  distended but feels this is due to his constipation.  The patient tries to follow a low salt diet.   In the ED blood pressure 196/110, pulse 112, 100.5 F, respiratory rate 18, 100% O2.  Rales noted on exam and 3+lower leg edema.  Labs showed CO2 18, glucose 154, creatinine 9.06, BUN 68, albumin 2.4. WBC 4.9, hemoglobin 8.9 HS troponin 303 > 328 > 314. BNP 2,665.5.  Covid positive.  D-dimer 1.06.  CRP 1.5. chest x-ray showed platelike opacity in the right base possibly representing infiltrate such as pneumonia, cardiomegaly.  EKG showed sinus tachycardia 115bpm with LVH.  Patient was given IV 80 mg of Lasix, potassium 20 mEq.  He was actually started on antibiotics these were discontinued once he was found to be Covid positive.   Heart Pathway Score:     Past Medical History:  Diagnosis Date  . Asthma   . Diabetes mellitus without complication ([place])   . Hypertension   . Renal disorder     Past Surgical History:  Procedure Laterality Date  . BIOPSY  12/22/2018   Procedure: BIOPSY;  Surgeon: Laurence Spates, MD;  Location: [facility];  Service: Endoscopy;;  . ESOPHAGOGASTRODUODENOSCOPY N/A 12/22/2018   Procedure: ESOPHAGOGASTRODUODENOSCOPY (EGD);  Surgeon: Laurence Spates, MD;  Location: [facility] ENDOSCOPY;  Service: Endoscopy;  Laterality: N/A;     Home Medications:  Prior to Admission medications   Medication Sig Start Date End Date Taking? Authorizing Provider  atorvastatin (LIPITOR) 20 MG tablet One pill each evening to lower cholesterol 01/16/19  Fulp, Cammie, MD  carvedilol (COREG) 25 MG tablet Take 1 tablet (25 mg total) by mouth 2 (two) times daily with a meal. 01/16/19 02/15/19  Fulp, Cammie, MD  ferrous sulfate 325 (65 FE) MG tablet Take 1 tablet (325 mg total) by mouth daily. 01/16/19 02/15/19  Fulp, Cammie, MD  furosemide (LASIX) 80 MG tablet Take 1 tablet (80 mg total) by mouth 2 (two) times daily. 07/05/18   Richardson Dopp T, PA-C  hydrALAZINE (APRESOLINE) 50 MG tablet Take 1 tablet (50 mg  total) by mouth 3 (three) times daily. 01/16/19 04/16/19  Fulp, Cammie, MD  pantoprazole (PROTONIX) 40 MG tablet Take 1 tablet (40 mg total) by mouth daily. 02/23/19 03/25/19  Roney Jaffe, MD  pantoprazole (PROTONIX) 40 MG tablet Take 1 tablet (40 mg total) by mouth 2 (two) times daily. 12/24/18 02/22/19  Roney Jaffe, MD    Inpatient Medications: Scheduled Meds: . atorvastatin  20 mg Oral q1800  . carvedilol  25 mg Oral BID WC  . enoxaparin (LOVENOX) injection  30 mg Subcutaneous Daily  . ferrous sulfate  325 mg Oral Daily  . furosemide  60 mg Intravenous Daily  . insulin aspart  0-6 Units Subcutaneous TID WC   Continuous Infusions:  PRN Meds: acetaminophen  Allergies:   No Known Allergies  Social History:   Social History   Socioeconomic History  . Marital status: Single    Spouse name: Not on file  . Number of children: Not on file  . Years of education: Not on file  . Highest education level: Not on file  Occupational History  . Not on file  Tobacco Use  . Smoking status: Never Smoker  . Smokeless tobacco: Never Used  Substance and Sexual Activity  . Alcohol use: Never    Comment: denies  . Drug use: Never    Types: Marijuana    Comment: denies  . Sexual activity: Not on file  Other Topics Concern  . Not on file  Social History Narrative  . Not on file   Social Determinants of Health   Financial Resource Strain:   . Difficulty of Paying Living Expenses: Not on file  Food Insecurity:   . Worried About Charity fundraiser in the Last Year: Not on file  . Ran Out of Food in the Last Year: Not on file  Transportation Needs:   . Lack of Transportation (Medical): Not on file  . Lack of Transportation (Non-Medical): Not on file  Physical Activity:   . Days of Exercise per Week: Not on file  . Minutes of Exercise per Session: Not on file  Stress:   . Feeling of Stress : Not on file  Social Connections:   . Frequency of Communication with Friends and  Family: Not on file  . Frequency of Social Gatherings with Friends and Family: Not on file  . Attends Religious Services: Not on file  . Active Member of Clubs or Organizations: Not on file  . Attends Archivist Meetings: Not on file  . Marital Status: Not on file  Intimate Partner Violence:   . Fear of Current or Ex-Partner: Not on file  . Emotionally Abused: Not on file  . Physically Abused: Not on file  . Sexually Abused: Not on file    Family History:   Family History  Problem Relation Age of Onset  . CAD Mother   . Hypertension Mother   . Diabetes Neg Hx   . Stroke Neg Hx   .  Cancer Neg Hx   . Kidney failure Neg Hx      ROS:  Please see the history of present illness.  All other ROS reviewed and negative.     Physical Exam/Data:   Vitals:   07/28/19 0251 07/28/19 0455 07/28/19 0544 07/28/19 1233  BP: (!) 164/89 (!) 157/95 (!) 155/94 120/86  Pulse: 99 100 (!) 104 76  Resp: 20 19 18 15   Temp: 99.1 F (37.3 C) 99.4 F (37.4 C) 99.1 F (37.3 C) 98.6 F (37 C)  TempSrc:  Axillary    SpO2: 98% 100% 96% 98%  Weight:  109 kg    Height:        Intake/Output Summary (Last 24 hours) at 07/28/2019 1239 Last data filed at 07/27/2019 2027 Gross per 24 hour  Intake 91.48 ml  Output --  Net 91.48 ml   Last 3 Weights 07/28/2019 07/27/2019 01/16/2019  Weight (lbs) 240 lb 4.8 oz 229 lb 4.5 oz 233 lb  Weight (kg) 109 kg 104 kg 105.688 kg     Physical exam was not performed due to COVID positive status  EKG:  The EKG was personally reviewed and demonstrates:  Sinus tachycardia, 115 bpm , LVH, LAD  Relevant CV Studies:  Echo 2019 Study Conclusions   - Left ventricle: The cavity size was moderately dilated. Wall  thickness was increased in a pattern of moderate LVH. Systolic  function was severely reduced. The estimated ejection fraction  was in the range of 25% to 30%. Diffuse hypokinesis. Doppler  parameters are consistent with both elevated ventricular   end-diastolic filling pressure and elevated left atrial filling  pressure.  - Mitral valve: There was mild regurgitation.  - Left atrium: The atrium was moderately dilated.  - Atrial septum: No defect or patent foramen ovale was identified.  - Pulmonary arteries: PA peak pressure: 36 mm Hg (S).   Laboratory Data:  High Sensitivity Troponin:   Recent Labs  Lab 07/27/19 1930 07/27/19 2110 07/28/19 0006 07/28/19 0203  TROPONINIHS 285* 303* 328* 314*     Chemistry Recent Labs  Lab 07/27/19 1851  NA 137  K 4.2  CL 105  CO2 18*  GLUCOSE 164*  BUN 68*  CREATININE 9.06*  CALCIUM 7.5*  GFRNONAA 6*  GFRAA 7*  ANIONGAP 14    Recent Labs  Lab 07/27/19 1851  PROT 5.9*  ALBUMIN 2.4*  AST 23  ALT 18  ALKPHOS 56  BILITOT 0.5   Hematology Recent Labs  Lab 07/27/19 1851  WBC 4.9  RBC 3.01*  HGB 8.9*  HCT 27.7*  MCV 92.0  MCH 29.6  MCHC 32.1  RDW 14.1  PLT 146*   BNP Recent Labs  Lab 07/27/19 1851  BNP 2,665.5*    DDimer  Recent Labs  Lab 07/28/19 0203  DDIMER 1.06*     Radiology/Studies:  DG Chest 2 View  Result Date: 07/27/2019 CLINICAL DATA:  Leg swelling and constipation.  Fever. EXAM: CHEST - 2 VIEW COMPARISON:  December 21, 2018 FINDINGS: Cardiomegaly. The hila and mediastinum are normal. No pneumothorax. Mild opacity in the right base is somewhat platelike in appearance. No other infiltrates are noted. No overt edema. IMPRESSION: Platelike opacity in the right base may represent atelectasis but infiltrate such as pneumonia is not excluded. Cardiomegaly. Electronically Signed   By: Dorise Bullion III M.D   On: 07/27/2019 18:57    Assessment and Plan:   Acute on chronic systolic heart failure -Patient reported missing one Lasix dose yesterday. -  BNP elevated up to 2,665.  This is also in the setting of chronic kidney disease with a creatinine of 9 -Last EF 25 to 30% in 2019.  He could not undergo cardiac catheterization at that time as he was not  on dialysis. -At home patient was taking Lasix 80 mg twice a day.  - Physical exam was not performed due to COVID positive status but ED reports 3+ edema on exam which patient states has mildly improved with IV lasix -Patient was started on IV Lasix 60 mg twice daily in the hospital. Unsure if this Lasix dose is sufficient given his chronic kidney disease>> could increase. Would also get nephrology on board given his progressive CKD and likely need for dialysis -I's and O's not complete but patient assures he has been urinating a lot.  - Weights do not appear accurate -Limited echocardiogram was ordered - MD to see  Covid infection -Patient was febrile and tachycardic on admission found to be Covid positive -Chest x-ray with possible pneumonia -Treatment per IM  Elevated troponin - HS troponin 303 > 328 > 314.  Trend is flat and not consistent with ACS -Patient denies chest pain -Troponin elevation in the setting of acute heart failure  CKD stage 5 not on HD -Over the last couple months creatinine has increased from 8-9. On admission it was 9.06 - Patient says he has not seen nephrology recently but according to the notes it appears they were going to arrange for vascular access for RRT in January 2020 -Consider nephrology consult  Hypertension -At baseline patient was on Coreg 25mg  twice daily, hydralazine 50 TID -Pressures were initially elevated on admission but have since improved - No Ace/Arb 2/2 to renal function  Diabetes type 2 -He reported he was not on any home regimen -SSI per IM  Hyperlipidemia -Continue statin     For questions or updates, please contact [place] Please consult [email or web address] for contact info under     Signed, Makenley Shimp Ninfa Meeker, PA-C  07/28/2019 12:39 PM

## 2019-07-28 NOTE — Progress Notes (Signed)
PROGRESS NOTE    Corey Park  OEV:[id number] DOB: [birth year] DOA: 07/27/2019 PCP: Patient, No Pcp Per    Brief Narrative:  49 year old gentleman with prior history of systolic heart failure with left ventricular ejection fraction of 25%, hypertension, type 2 diabetes, stage V CKD, anemia of chronic disease secondary to CKD, hyperlipidemia, blindness presents with generalized weakness and fluid overload patient reports that he missed his Lasix in the last few days and also reports fever with chills occasional shortness of breath.  On arrival to ED he was febrile, slightly tachycardic but not hypoxic and with good sats on room air.  His chest x-ray shows    Assessment & Plan:   Principal Problem:   Acute CHF (congestive heart failure) (HCC) Active Problems:   DM (diabetes mellitus), type 2 (HCC)   HLD (hyperlipidemia)   Chronic kidney disease V   Anemia in chronic kidney disease (CKD)   Fe deficiency anemia   COVID-19 virus infection    Acute on chronic diastolic and systolic heart failure Patient sats good on room air. Started on IV Lasix, strict intake and output and daily weights. Echocardiogram ordered and cardiology consulted. Monitor renal parameters and electrolytes on IV Lasix.    Acute on stage V CKD With mild metabolic acidosis Nephrology consulted to initiate HD while inpatient.    Covid  19 virus infection Except for fever and shortness of breath patient denies any other complaints. CRP is 1.5. lactic acid 1.7 Chest x-ray showed mild opacity in the lung base.  Anemia of chronic disease secondary to CKD Transfuse to keep hemoglobin greater than 7.   Hyperlipidemia:  Continue with statin   Diabetes mellitus CBG (last 3)  Recent Labs    07/28/19 0851 07/28/19 1153 07/28/19 1742  GLUCAP 215* 139* 99   Continue with sliding scale insulin   DVT prophylaxis: Lovenox.  Code Status: Full code.  Family Communication: None at bedside.    Disposition Plan:  . Patient came from:Home.             . Anticipated d/c place:Home.  . Barriers to d/c OR conditions which need to be met to effect a safe d/c: pending clinical improvement.    Consultants:   Cardiology  Nephrology   Procedures:  Echocardiogram  Antimicrobials:none.   Subjective: No chest pain or sob. No nausea, vomiting or abdominal pain.  Pt reports constipation. Wants stool softeners.   Objective: Vitals:   07/28/19 0012 07/28/19 0251 07/28/19 0455 07/28/19 0544  BP: (!) 168/97 (!) 164/89 (!) 157/95 (!) 155/94  Pulse: 99 99 100 (!) 104  Resp: (!) '24 20 19 18  '$ Temp: 98.5 F (36.9 C) 99.1 F (37.3 C) 99.4 F (37.4 C) 99.1 F (37.3 C)  TempSrc:   Axillary   SpO2: 94% 98% 100% 96%  Weight:   109 kg   Height:        Intake/Output Summary (Last 24 hours) at 07/28/2019 1100 Last data filed at 07/27/2019 2027 Gross per 24 hour  Intake 91.48 ml  Output --  Net 91.48 ml   Filed Weights   07/27/19 2351 07/28/19 0455  Weight: 104 kg 109 kg    Examination:  General exam: Appears calm and comfortable on RA with sats in low 90's Respiratory system: Clear to auscultation. Respiratory effort normal. Cardiovascular system: S1 & S2 heard, RRR, leg EDEMA present, JVD cannot be appreciated.  Gastrointestinal system: Abdomen is nondistended, soft and nontender.Normal bowel sounds heard. Central nervous system: Alert and oriented.  No focal neurological deficits. Extremities: pedal edema present.  Skin: No rashes,  Psychiatry: Mood is appropriate.     Data Reviewed: I have personally reviewed following labs and imaging studies  CBC: Recent Labs  Lab 07/27/19 1851  WBC 4.9  NEUTROABS 4.1  HGB 8.9*  HCT 27.7*  MCV 92.0  PLT 161*   Basic Metabolic Panel: Recent Labs  Lab 07/27/19 1851 07/28/19 0203  NA 137  --   K 4.2  --   CL 105  --   CO2 18*  --   GLUCOSE 164*  --   BUN 68*  --   CREATININE 9.06*  --   CALCIUM 7.5*  --   MG  --   1.6*   GFR: Estimated Creatinine Clearance: 13.1 mL/min (A) (by C-G formula based on SCr of 9.06 mg/dL (H)). Liver Function Tests: Recent Labs  Lab 07/27/19 1851  AST 23  ALT 18  ALKPHOS 56  BILITOT 0.5  PROT 5.9*  ALBUMIN 2.4*   Recent Labs  Lab 07/27/19 1930  LIPASE 63*   No results for input(s): AMMONIA in the last 168 hours. Coagulation Profile: No results for input(s): INR, PROTIME in the last 168 hours. Cardiac Enzymes: No results for input(s): CKTOTAL, CKMB, CKMBINDEX, TROPONINI in the last 168 hours. BNP (last 3 results) No results for input(s): PROBNP in the last 8760 hours. HbA1C: Recent Labs    07/28/19 0006  HGBA1C 6.0*   CBG: Recent Labs  Lab 07/28/19 0851  GLUCAP 215*   Lipid Profile: No results for input(s): CHOL, HDL, LDLCALC, TRIG, CHOLHDL, LDLDIRECT in the last 72 hours. Thyroid Function Tests: No results for input(s): TSH, T4TOTAL, FREET4, T3FREE, THYROIDAB in the last 72 hours. Anemia Panel: Recent Labs    07/28/19 0203  FERRITIN 44   Sepsis Labs: Recent Labs  Lab 07/27/19 1851  LATICACIDVEN 1.7    Recent Results (from the past 240 hour(s))  Blood culture (routine x 2)     Status: None (Preliminary result)   Collection Time: 07/27/19  7:30 PM   Specimen: BLOOD  Result Value Ref Range Status   Specimen Description   Final    BLOOD RIGHT ANTECUBITAL Performed at [facility], [place] [address] [place], [place] [address]    Special Requests   Final    BOTTLES DRAWN AEROBIC AND ANAEROBIC Blood Culture results may not be optimal due to an inadequate volume of blood received in culture bottles   Culture PENDING  Incomplete   Report Status PENDING  Incomplete         Radiology Studies: DG Chest 2 View  Result Date: 07/27/2019 CLINICAL DATA:  Leg swelling and constipation.  Fever. EXAM: CHEST - 2 VIEW COMPARISON:  December 21, 2018 FINDINGS: Cardiomegaly. The hila and mediastinum are normal. No pneumothorax. Mild opacity in the  right base is somewhat platelike in appearance. No other infiltrates are noted. No overt edema. IMPRESSION: Platelike opacity in the right base may represent atelectasis but infiltrate such as pneumonia is not excluded. Cardiomegaly. Electronically Signed   By: Dorise Bullion III M.D   On: 07/27/2019 18:57        Scheduled Meds: . atorvastatin  20 mg Oral q1800  . carvedilol  25 mg Oral BID WC  . enoxaparin (LOVENOX) injection  30 mg Subcutaneous Daily  . ferrous sulfate  325 mg Oral Daily  . furosemide  60 mg Intravenous Daily  . insulin aspart  0-6 Units Subcutaneous TID WC  Continuous Infusions:   LOS: 1 day      Hosie Poisson, MD [facility]   To contact the attending provider between 7A-7P or the covering provider during after hours 7P-7A, please log into the web site [email or web address] and access using universal [place] password for that web site. If you do not have the password, please call the hospital operator.  07/28/2019, 11:00 AM

## 2019-07-29 ENCOUNTER — Inpatient Hospital Stay (HOSPITAL_COMMUNITY): Payer: Medicaid Other

## 2019-07-29 HISTORY — PX: IR FLUORO GUIDE CV LINE RIGHT: IMG2283

## 2019-07-29 HISTORY — PX: IR US GUIDE VASC ACCESS RIGHT: IMG2390

## 2019-07-29 LAB — HEPATITIS B SURFACE ANTIBODY,QUALITATIVE: Hep B S Ab: NONREACTIVE

## 2019-07-29 LAB — RENAL FUNCTION PANEL
Albumin: 2.1 g/dL — ABNORMAL LOW (ref 3.5–5.0)
Anion gap: 10 (ref 5–15)
BUN: 71 mg/dL — ABNORMAL HIGH (ref 6–20)
CO2: 17 mmol/L — ABNORMAL LOW (ref 22–32)
Calcium: 6.6 mg/dL — ABNORMAL LOW (ref 8.9–10.3)
Chloride: 104 mmol/L (ref 98–111)
Creatinine, Ser: 9.15 mg/dL — ABNORMAL HIGH (ref 0.61–1.24)
GFR calc Af Amer: 7 mL/min — ABNORMAL LOW (ref >60)
GFR calc non Af Amer: 6 mL/min — ABNORMAL LOW (ref >60)
Glucose, Bld: 167 mg/dL — ABNORMAL HIGH (ref 70–99)
Phosphorus: 4.6 mg/dL (ref 2.5–4.6)
Potassium: 4 mmol/L (ref 3.5–5.1)
Sodium: 131 mmol/L — ABNORMAL LOW (ref 135–145)

## 2019-07-29 LAB — CBC WITH DIFFERENTIAL/PLATELET
Abs Immature Granulocytes: 0.01 10*3/uL (ref 0.00–0.07)
Basophils Absolute: 0 10*3/uL (ref 0.0–0.1)
Basophils Relative: 0 %
Eosinophils Absolute: 0.1 10*3/uL (ref 0.0–0.5)
Eosinophils Relative: 2 %
HCT: 25.1 % — ABNORMAL LOW (ref 39.0–52.0)
Hemoglobin: 8.1 g/dL — ABNORMAL LOW (ref 13.0–17.0)
Immature Granulocytes: 0 %
Lymphocytes Relative: 16 %
Lymphs Abs: 0.5 10*3/uL — ABNORMAL LOW (ref 0.7–4.0)
MCH: 29.2 pg (ref 26.0–34.0)
MCHC: 32.3 g/dL (ref 30.0–36.0)
MCV: 90.6 fL (ref 80.0–100.0)
Monocytes Absolute: 0.1 10*3/uL (ref 0.1–1.0)
Monocytes Relative: 4 %
Neutro Abs: 2.6 10*3/uL (ref 1.7–7.7)
Neutrophils Relative %: 78 %
Platelets: 99 10*3/uL — ABNORMAL LOW (ref 150–400)
RBC: 2.77 MIL/uL — ABNORMAL LOW (ref 4.22–5.81)
RDW: 14.1 % (ref 11.5–15.5)
WBC: 3.3 10*3/uL — ABNORMAL LOW (ref 4.0–10.5)
nRBC: 0 % (ref 0.0–0.2)

## 2019-07-29 LAB — COMPREHENSIVE METABOLIC PANEL
ALT: 16 U/L (ref 0–44)
AST: 21 U/L (ref 15–41)
Albumin: 2.1 g/dL — ABNORMAL LOW (ref 3.5–5.0)
Alkaline Phosphatase: 47 U/L (ref 38–126)
Anion gap: 15 (ref 5–15)
BUN: 71 mg/dL — ABNORMAL HIGH (ref 6–20)
CO2: 17 mmol/L — ABNORMAL LOW (ref 22–32)
Calcium: 7.3 mg/dL — ABNORMAL LOW (ref 8.9–10.3)
Chloride: 104 mmol/L (ref 98–111)
Creatinine, Ser: 9.22 mg/dL — ABNORMAL HIGH (ref 0.61–1.24)
GFR calc Af Amer: 7 mL/min — ABNORMAL LOW (ref >60)
GFR calc non Af Amer: 6 mL/min — ABNORMAL LOW (ref >60)
Glucose, Bld: 99 mg/dL (ref 70–99)
Potassium: 4.2 mmol/L (ref 3.5–5.1)
Sodium: 136 mmol/L (ref 135–145)
Total Bilirubin: 0.6 mg/dL (ref 0.3–1.2)
Total Protein: 5.3 g/dL — ABNORMAL LOW (ref 6.5–8.1)

## 2019-07-29 LAB — GLUCOSE, CAPILLARY
Glucose-Capillary: 91 mg/dL (ref 70–99)
Glucose-Capillary: 98 mg/dL (ref 70–99)
Glucose-Capillary: 118 mg/dL — ABNORMAL HIGH (ref 70–99)
Glucose-Capillary: 161 mg/dL — ABNORMAL HIGH (ref 70–99)

## 2019-07-29 LAB — MAGNESIUM: Magnesium: 1.4 mg/dL — ABNORMAL LOW (ref 1.7–2.4)

## 2019-07-29 LAB — CBC
HCT: 24.1 % — ABNORMAL LOW (ref 39.0–52.0)
Hemoglobin: 7.8 g/dL — ABNORMAL LOW (ref 13.0–17.0)
MCH: 29 pg (ref 26.0–34.0)
MCHC: 32.4 g/dL (ref 30.0–36.0)
MCV: 89.6 fL (ref 80.0–100.0)
Platelets: 100 10*3/uL — ABNORMAL LOW (ref 150–400)
RBC: 2.69 MIL/uL — ABNORMAL LOW (ref 4.22–5.81)
RDW: 14 % (ref 11.5–15.5)
WBC: 2.7 10*3/uL — ABNORMAL LOW (ref 4.0–10.5)
nRBC: 0 % (ref 0.0–0.2)

## 2019-07-29 LAB — D-DIMER, QUANTITATIVE: D-Dimer, Quant: 0.88 ug/mL-FEU — ABNORMAL HIGH (ref 0.00–0.50)

## 2019-07-29 LAB — C-REACTIVE PROTEIN: CRP: 2 mg/dL — ABNORMAL HIGH (ref <1.0)

## 2019-07-29 LAB — FERRITIN: Ferritin: 90 ng/mL (ref 24–336)

## 2019-07-29 LAB — HEPATITIS B SURFACE ANTIGEN: Hepatitis B Surface Ag: NONREACTIVE

## 2019-07-29 LAB — HEPATITIS B CORE ANTIBODY, TOTAL: Hep B Core Total Ab: NONREACTIVE

## 2019-07-29 MED ORDER — CHLORPROMAZINE HCL 10 MG PO TABS
10.0000 mg | ORAL_TABLET | Freq: Three times a day (TID) | ORAL | Status: DC | PRN
  Administered 2019-07-29 (×2): 10 mg via ORAL
  Filled 2019-07-29 (×4): qty 1

## 2019-07-29 MED ORDER — HEPARIN SODIUM (PORCINE) 1000 UNIT/ML IJ SOLN
INTRAMUSCULAR | Status: AC
  Administered 2019-07-29: 3.8 mL
  Filled 2019-07-29: qty 1

## 2019-07-29 MED ORDER — MIDAZOLAM HCL 2 MG/2ML IJ SOLN
INTRAMUSCULAR | Status: AC | PRN
  Administered 2019-07-29: 1 mg via INTRAVENOUS
  Administered 2019-07-29: 0.5 mg via INTRAVENOUS

## 2019-07-29 MED ORDER — GELATIN ABSORBABLE 12-7 MM EX MISC
CUTANEOUS | Status: AC
  Filled 2019-07-29: qty 1

## 2019-07-29 MED ORDER — HYDRALAZINE HCL 25 MG PO TABS
25.0000 mg | ORAL_TABLET | Freq: Three times a day (TID) | ORAL | Status: DC | PRN
  Administered 2019-07-29 – 2019-07-30 (×2): 25 mg via ORAL
  Filled 2019-07-29 (×2): qty 1

## 2019-07-29 MED ORDER — MIDAZOLAM HCL 2 MG/2ML IJ SOLN
INTRAMUSCULAR | Status: AC
  Filled 2019-07-29: qty 4

## 2019-07-29 MED ORDER — CEFAZOLIN SODIUM-DEXTROSE 2-4 GM/100ML-% IV SOLN
INTRAVENOUS | Status: AC
  Filled 2019-07-29: qty 100

## 2019-07-29 MED ORDER — FENTANYL CITRATE (PF) 100 MCG/2ML IJ SOLN
INTRAMUSCULAR | Status: AC
  Filled 2019-07-29: qty 4

## 2019-07-29 MED ORDER — FENTANYL CITRATE (PF) 100 MCG/2ML IJ SOLN
INTRAMUSCULAR | Status: AC | PRN
  Administered 2019-07-29: 25 ug via INTRAVENOUS
  Administered 2019-07-29: 50 ug via INTRAVENOUS

## 2019-07-29 MED ORDER — ENOXAPARIN SODIUM 30 MG/0.3ML ~~LOC~~ SOLN: 30.0000 mg | Freq: Every day | SUBCUTANEOUS | Status: DC

## 2019-07-29 MED ORDER — LIDOCAINE HCL (PF) 1 % IJ SOLN
INTRAMUSCULAR | Status: AC | PRN
  Administered 2019-07-29: 5 mL

## 2019-07-29 MED ORDER — GABAPENTIN 100 MG PO CAPS
100.0000 mg | ORAL_CAPSULE | Freq: Three times a day (TID) | ORAL | Status: AC
  Administered 2019-07-29 – 2019-07-31 (×6): 100 mg via ORAL
  Filled 2019-07-29 (×6): qty 1

## 2019-07-29 MED ORDER — LIDOCAINE HCL 1 % IJ SOLN
INTRAMUSCULAR | Status: AC
  Filled 2019-07-29: qty 20

## 2019-07-29 MED ORDER — MAGNESIUM OXIDE 400 (241.3 MG) MG PO TABS
400.0000 mg | ORAL_TABLET | Freq: Two times a day (BID) | ORAL | Status: DC
  Administered 2019-07-29 – 2019-08-01 (×8): 400 mg via ORAL
  Filled 2019-07-29 (×8): qty 1

## 2019-07-29 MED ORDER — CEFAZOLIN SODIUM-DEXTROSE 2-4 GM/100ML-% IV SOLN
2.0000 g | Freq: Once | INTRAVENOUS | Status: AC
  Administered 2019-07-29: 2 g via INTRAVENOUS

## 2019-07-29 MED ORDER — HEPARIN SODIUM (PORCINE) 5000 UNIT/ML IJ SOLN
5000.0000 [IU] | Freq: Three times a day (TID) | INTRAMUSCULAR | Status: DC
  Administered 2019-07-30 – 2019-08-03 (×11): 5000 [IU] via SUBCUTANEOUS
  Filled 2019-07-29 (×11): qty 1

## 2019-07-29 NOTE — Progress Notes (Signed)
Brief cardiology follow up note:  Appreciate nephrology involvement and recommendations. Appears he will be starting HD for volume management. We will follow peripherally at this time but are available for questions.  Buford Dresser, MD, PhD [facility]  [address], [place], [place] [address] [phone]

## 2019-07-29 NOTE — Plan of Care (Signed)

## 2019-07-29 NOTE — Progress Notes (Signed)
   [facility] Progress Note   Assessment/ Plan:   1.  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  s/p [facility] 07/29/19, HD #1 today 2/2.  CLIP will start (have d/w renal navigator); HD #2 tomorrow.  2.  COVID: per primary  3.  HTN: reasonably well-controlled  4.  Anemia: ESA and iron as appropriate  5.  CKD-MB: binders  6.  Dispo: pending  Subjective:    HD #1 today.     Objective:   BP (!) 143/92   Pulse 84   Temp 98.2 F (36.8 C) (Oral)   Resp 20   Ht 6\' 2"  (1.88 m)   Wt 110 kg   SpO2 98%   BMI 31.14 kg/m   Physical Exam: Unavailable for exam Labs: BMET Recent Labs  Lab 07/27/19 1851 07/28/19 0203 07/29/19 0336 07/29/19 1400  NA 137 138 136 131*  K 4.2 4.2 4.2 4.0  CL 105 107 104 104  CO2 18* 18* 17* 17*  GLUCOSE 164* 101* 99 167*  BUN 68* 68* 71* 71*  CREATININE 9.06* 9.02* 9.22* 9.15*  CALCIUM 7.5* 7.5* 7.3* 6.6*  PHOS  --   --   --  4.6   CBC Recent Labs  Lab 07/27/19 1851 07/29/19 0336 07/29/19 1316  WBC 4.9 3.3* 2.7*  NEUTROABS 4.1 2.6  --   HGB 8.9* 8.1* 7.8*  HCT 27.7* 25.1* 24.1*  MCV 92.0 90.6 89.6  PLT 146* 99* 100*      Medications:    . atorvastatin  20 mg Oral q1800  . carvedilol  25 mg Oral BID WC  . Chlorhexidine Gluconate Cloth  6 each Topical Q0600  . fentaNYL      . ferrous sulfate  325 mg Oral Daily  . furosemide  60 mg Intravenous Daily  . gelatin adsorbable      . [START ON 07/30/2019] heparin injection (subcutaneous)  5,000 Units Subcutaneous Q8H  . insulin aspart  0-6 Units Subcutaneous TID WC  . lidocaine      . magnesium oxide  400 mg Oral BID  . midazolam      . polyethylene glycol  17 g Oral Daily  . senna-docusate  2 tablet Oral BID     Madelon Lips MD 07/29/2019, 3:59 PM

## 2019-07-29 NOTE — Progress Notes (Signed)
PROGRESS NOTE    Corey Park  KGU:[id number] DOB: 1971-02-26 DOA: 07/27/2019 PCP: Patient, No Pcp Per    Brief Narrative:  49 year old gentleman with prior history of systolic heart failure with left ventricular ejection fraction of 25%, hypertension, type 2 diabetes, stage V CKD, anemia of chronic disease secondary to CKD, hyperlipidemia, blindness presents with generalized weakness and fluid overload patient reports that he missed his Lasix in the last few days and also reports fever with chills occasional shortness of breath.  On arrival to ED he was febrile, slightly tachycardic but not hypoxic and with good sats on room air.  He was found to be in acute kidney failure superimposed on stage V CKD. Nephrology consulted and plan for hemodialysis today.  Tunneled dialysis catheter was placed on 07/29/2019. Cardiology was consulted on chronic systolic and diastolic heart failure.    Assessment & Plan:   Principal Problem:   Acute CHF (congestive heart failure) (HCC) Active Problems:   DM (diabetes mellitus), type 2 (HCC)   HLD (hyperlipidemia)   Chronic kidney disease V   Anemia in chronic kidney disease (CKD)   Fe deficiency anemia   COVID-19 virus infection    Acute on chronic diastolic and systolic heart failure Patient is currently not hypoxic on room air with good oxygen saturations. Started on IV Lasix 60 mg daily, strict intake and output and daily weights. Further fluid management as per hemodialysis. Echocardiogram ordered showed left ventricular ejection fraction of 20 to 25% with decreased function and a severely increased left ventricular wall thickness.  He also has left ventricular diastolic parameters consistent with grade 2 diastolic dysfunction.  Left ventricular demonstrated global hypokinesis.  Right ventricle has severely reduced systolic function.   Cardiology consulted and recommendations given. Continue with Lasix and Coreg 25 mg twice daily Monitor renal  parameters and electrolytes on IV Lasix.    Acute on stage V CKD With mild metabolic acidosis Nephrology consulted to initiate HD while inpatient. Plan for HD today.  Potassium is 4.2.   Covid  19 virus infection Patient denies any respiratory complaints or gastrointestinal symptoms CRP is 2. lactic acid 1.7, ferritin is 90 Chest x-ray showed mild opacity in the lung base.  Anemia of chronic disease secondary to CKD Transfuse to keep hemoglobin greater than 7. Hemoglobin stable around 8  Hyperlipidemia:  Continue with Lipitor   Diabetes mellitus CBG (last 3)  Recent Labs    07/28/19 2228 07/29/19 0741 07/29/19 1343  GLUCAP 134* 91 98   Continue with sliding scale insulin Check hemoglobin A1c.    Mild thrombocytopenia and leukopenia. Continue to monitor. No signs of any bleeding.   Persistent hiccups ? Uremia.  Started the patient on Thorazine.   Hypomagnesemia Magnesium supplements added.   Essential hypertension Blood pressure parameters not well controlled. As needed hydralazine added to his regimen.   DVT prophylaxis: Lovenox.  Code Status: Full code.  Family Communication: None at bedside.  Disposition Plan:  . Patient came from:Home.             . Anticipated d/c place:Home.  . Barriers to d/c OR conditions which need to be met to effect a safe d/c: Clinical improvement AKI, needs hemodialysis, will need outpatient HD set up.   Consultants:   Cardiology  Nephrology  IR   Procedures:  Echocardiogram Hemodialysis catheter placement by IR on 07/29/2019  Antimicrobials: 1 dose of cefazolin  Subjective: Patient reports hiccups. Patient denies any chest pain, shortness of breath, nausea, vomiting or  abdominal pain or diarrhea.  Objective: Vitals:   07/29/19 1230 07/29/19 1235 07/29/19 1240 07/29/19 1245  BP: (!) 148/97 (!) 147/105 (!) 142/94 (!) 143/92  Pulse: 86 85 85 84  Resp: '20 20 20 20  '$ Temp:      TempSrc:      SpO2: 100%  100% 100% 98%  Weight:      Height:       No intake or output data in the 24 hours ending 07/29/19 1422 Filed Weights   07/27/19 2351 07/28/19 0455 07/29/19 0607  Weight: 104 kg 109 kg 110 kg    Examination:  General exam: Continues to have hiccups, uncomfortable, not in any kind of distress. Respiratory system: Clear to auscultation bilaterally, no wheezing or rhonchi Cardiovascular system: S1-S2 heard, leg edema present, regular rate rhythm, no JVD. Gastrointestinal system: Abdomen is soft, nontender, nondistended, bowel sounds normal Central nervous system: Alert and oriented, grossly nonfocal Extremities: Leg edema present Skin: No rashes Psychiatry: Mood is appropriate   Data Reviewed: I have personally reviewed following labs and imaging studies  CBC: Recent Labs  Lab 07/27/19 1851 07/29/19 0336  WBC 4.9 3.3*  NEUTROABS 4.1 2.6  HGB 8.9* 8.1*  HCT 27.7* 25.1*  MCV 92.0 90.6  PLT 146* 99*   Basic Metabolic Panel: Recent Labs  Lab 07/27/19 1851 07/28/19 0203 07/29/19 0336  NA 137 138 136  K 4.2 4.2 4.2  CL 105 107 104  CO2 18* 18* 17*  GLUCOSE 164* 101* 99  BUN 68* 68* 71*  CREATININE 9.06* 9.02* 9.22*  CALCIUM 7.5* 7.5* 7.3*  MG  --  1.6* 1.4*   GFR: Estimated Creatinine Clearance: 12.9 mL/min (A) (by C-G formula based on SCr of 9.22 mg/dL (H)). Liver Function Tests: Recent Labs  Lab 07/27/19 1851 07/28/19 0203 07/29/19 0336  AST '23 23 21  '$ ALT '18 18 16  '$ ALKPHOS 56 51 47  BILITOT 0.5 0.6 0.6  PROT 5.9* 5.5* 5.3*  ALBUMIN 2.4* 2.3* 2.1*   Recent Labs  Lab 07/27/19 1930  LIPASE 63*   No results for input(s): AMMONIA in the last 168 hours. Coagulation Profile: No results for input(s): INR, PROTIME in the last 168 hours. Cardiac Enzymes: No results for input(s): CKTOTAL, CKMB, CKMBINDEX, TROPONINI in the last 168 hours. BNP (last 3 results) No results for input(s): PROBNP in the last 8760 hours. HbA1C: Recent Labs    07/28/19 0006    HGBA1C 6.0*   CBG: Recent Labs  Lab 07/28/19 1153 07/28/19 1742 07/28/19 2228 07/29/19 0741 07/29/19 1343  GLUCAP 139* 99 134* 91 98   Lipid Profile: No results for input(s): CHOL, HDL, LDLCALC, TRIG, CHOLHDL, LDLDIRECT in the last 72 hours. Thyroid Function Tests: No results for input(s): TSH, T4TOTAL, FREET4, T3FREE, THYROIDAB in the last 72 hours. Anemia Panel: Recent Labs    07/28/19 0203 07/29/19 0336  FERRITIN 44 90   Sepsis Labs: Recent Labs  Lab 07/27/19 1851  LATICACIDVEN 1.7    Recent Results (from the past 240 hour(s))  Blood culture (routine x 2)     Status: None (Preliminary result)   Collection Time: 07/27/19  7:30 PM   Specimen: BLOOD  Result Value Ref Range Status   Specimen Description   Final    BLOOD RIGHT ANTECUBITAL Performed at [facility], [place] [address], [place], [place] [address]    Special Requests   Final    BOTTLES DRAWN AEROBIC AND ANAEROBIC Blood Culture results may not be optimal due  to an inadequate volume of blood received in culture bottles   Culture NO GROWTH 2 DAYS  Final   Report Status PENDING  Incomplete  Blood culture (routine x 2)     Status: None (Preliminary result)   Collection Time: 07/27/19  7:57 PM   Specimen: BLOOD RIGHT HAND  Result Value Ref Range Status   Specimen Description BLOOD RIGHT HAND  Final   Special Requests   Final    BOTTLES DRAWN AEROBIC ONLY Blood Culture results may not be optimal due to an inadequate volume of blood received in culture bottles   Culture NO GROWTH 2 DAYS  Final   Report Status PENDING  Incomplete         Radiology Studies: DG Chest 2 View  Result Date: 07/27/2019 CLINICAL DATA:  Leg swelling and constipation.  Fever. EXAM: CHEST - 2 VIEW COMPARISON:  December 21, 2018 FINDINGS: Cardiomegaly. The hila and mediastinum are normal. No pneumothorax. Mild opacity in the right base is somewhat platelike in appearance. No other infiltrates are noted. No overt edema. IMPRESSION:  Platelike opacity in the right base may represent atelectasis but infiltrate such as pneumonia is not excluded. Cardiomegaly. Electronically Signed   By: Dorise Bullion III M.D   On: 07/27/2019 18:57   ECHOCARDIOGRAM LIMITED  Result Date: 07/28/2019   ECHOCARDIOGRAM LIMITED REPORT   Patient Name:   Corey Park Date of Exam: 07/28/2019 Medical Rec #:  [id number]       Height:       74.0 in Accession #:    [id number]      Weight:       240.3 lb Date of Birth:  [birth year]       BSA:          2.35 m Patient Age:    69 years        BP:           120/86 mmHg Patient Gender: M               HR:           76 bpm. Exam Location:  Inpatient  Procedure: Limited Echo, Cardiac Doppler and Color Doppler Indications:    CHF  History:        Patient has prior history of Echocardiogram examinations, most                 recent 06/17/2018. Covid positive, CKD.  Sonographer:    Dustin Flock Referring Phys: [id number] [place]  1. Left ventricular ejection fraction, by visual estimation, is 20 to 25%. The left ventricle has severely decreased function. There is severely increased left ventricular wall thickness.  2. Elevated left atrial pressure.  3. Left ventricular diastolic parameters are consistent with Grade II diastolic dysfunction (pseudonormalization).  4. Mildly dilated left ventricular internal cavity size.  5. The left ventricle demonstrates global hypokinesis.  6. Global right ventricle has severely reduced systolic function.The right ventricular size is normal.  7. Trivial pericardial effusion is present.  8. The mitral valve is normal in structure. Trivial mitral valve regurgitation. No evidence of mitral stenosis.  9. The tricuspid valve was normal in structure. Tricuspid valve regurgitation is mild. 10. Mild aortic valve sclerosis without stenosis. 11. The pulmonic valve was normal in structure. Pulmonic valve regurgitation is mild by color flow Doppler. 12. Aortic dilatation noted. 13. There is  mild dilatation of the aortic root measuring 39 mm. 14. Moderately elevated pulmonary artery systolic pressure.  15. The inferior vena cava is dilated in size with <50% respiratory variability, suggesting right atrial pressure of 15 mmHg. 16. Severe global reduction in LV systolic function; severe LVH; mild LVE; severe RV dysfunction; mild TR; moderate pulmonary hypertension. FINDINGS  Left Ventricle: Left ventricular ejection fraction, by visual estimation, is 20 to 25%. The left ventricle has severely decreased function. The left ventricle demonstrates global hypokinesis. The left ventricular internal cavity size was mildly dilated left ventricle. There is severely increased left ventricular wall thickness. Left ventricular diastolic parameters are consistent with Grade II diastolic dysfunction (pseudonormalization). Elevated left atrial pressure. Right Ventricle: The right ventricular size is normal.Global RV systolic function is has severely reduced systolic function. The tricuspid regurgitant velocity is 2.94 m/s, and with an assumed right atrial pressure of 8 mmHg, the estimated right ventricular systolic pressure is moderately elevated at 42.5 mmHg. Left Atrium: Left atrial size was normal in size. Right Atrium: Right atrial size was normal in size. Right atrial pressure is estimated at 8 mmHg. Pericardium: Trivial pericardial effusion is present is seen. Trivial pericardial effusion is present. Mitral Valve: The mitral valve is normal in structure. There is mild thickening of the mitral valve leaflet(s). No evidence of mitral valve stenosis by observation. MV Area by PHT, 4.49 cm. MV PHT, 49.01 msec. Trivial mitral valve regurgitation. Tricuspid Valve: The tricuspid valve is normal in structure. Tricuspid valve regurgitation is mild. Aortic Valve: The aortic valve is tricuspid. Aortic valve regurgitation is not visualized. Mild aortic valve sclerosis is present, with no evidence of aortic valve stenosis.  Pulmonic Valve: The pulmonic valve was normal in structure. Pulmonic valve regurgitation is mild by color flow Doppler. Pulmonic regurgitation is mild by color flow Doppler. Aorta: Aortic dilatation noted. There is mild dilatation of the aortic root measuring 39 mm. Venous: The inferior vena cava is dilated in size with less than 50% respiratory variability, suggesting right atrial pressure of 15 mmHg. Shunts: There is no evidence of a patent foramen ovale.  Additional Comments: Severe global reduction in LV systolic function; severe LVH; mild LVE; severe RV dysfunction; mild TR; moderate pulmonary hypertension.  LEFT VENTRICLE          Normals PLAX 2D LVIDd:         5.92 cm  3.6 cm   Diastology                 Normals LVIDs:         4.48 cm  1.7 cm   LV e' lateral:   4.70 cm/s 6.42 cm/s LV PW:         1.53 cm  1.4 cm   LV E/e' lateral: 12.4      15.4 LV IVS:        1.72 cm  1.3 cm   LV e' medial:    3.24 cm/s 6.96 cm/s LVOT diam:     2.20 cm  2.0 cm   LV E/e' medial:  18.0      6.96 LV SV:         83 ml    79 ml LV SV Index:   34.46    45 ml/m2 LVOT Area:     3.80 cm 3.14 cm2  LEFT ATRIUM         Index LA diam:    3.90 cm 1.66 cm/m  AORTIC VALVE             Normals LVOT Vmax:   79.60 cm/s LVOT Vmean:  48.000  cm/s 75 cm/s LVOT VTI:    0.130 m     25.3 cm  AORTA                 Normals Ao Root diam: 3.90 cm 31 mm MITRAL VALVE              Normals   TRICUSPID VALVE             Normals MV Area (PHT): 4.49 cm             TR Peak grad:   34.5 mmHg MV PHT:        49.01 msec 55 ms     TR Vmax:        330.00 cm/s 288 cm/s MV Decel Time: 169 msec   187 ms MV E velocity: 58.30 cm/s 103 cm/s  SHUNTS MV A velocity: 38.10 cm/s 70.3 cm/s Systemic VTI:  0.13 m MV E/A ratio:  1.53       1.5       Systemic Diam: 2.20 cm  Kirk Ruths MD Electronically signed by Kirk Ruths MD Signature Date/Time: 07/28/2019/2:52:57 PMThe mitral valve is normal in structure.    Final         Scheduled Meds: . atorvastatin  20 mg Oral  q1800  . carvedilol  25 mg Oral BID WC  . Chlorhexidine Gluconate Cloth  6 each Topical Q0600  . fentaNYL      . ferrous sulfate  325 mg Oral Daily  . furosemide  60 mg Intravenous Daily  . gelatin adsorbable      . [START ON 07/30/2019] heparin injection (subcutaneous)  5,000 Units Subcutaneous Q8H  . insulin aspart  0-6 Units Subcutaneous TID WC  . lidocaine      . magnesium oxide  400 mg Oral BID  . midazolam      . polyethylene glycol  17 g Oral Daily  . senna-docusate  2 tablet Oral BID   Continuous Infusions: . sodium chloride    . sodium chloride    . ceFAZolin       LOS: 2 days      Hosie Poisson, MD [facility]   To contact the attending provider between 7A-7P or the covering provider during after hours 7P-7A, please log into the web site [email or web address] and access using universal [place] password for that web site. If you do not have the password, please call the hospital operator.  07/29/2019, 2:22 PM

## 2019-07-29 NOTE — H&P (Signed)
Patient Status: [facility] - In-pt  Assessment and Plan:  49 y/o M with history of CKD V now requiring hemodialysis - IR has been asked to place a tunneled HD catheter for long term HD access. Patient has been NPO since midnight, Lovenox has been held, WBC 3.3, hgb 8.1, plt 99.   Risks and benefits discussed with the patient including, but not limited to bleeding, infection, vascular injury, pneumothorax which may require chest tube placement, air embolism or even death.  All of the patient's questions were answered, patient is agreeable to proceed.  Verbal consent obtained due to Covid (+) status and is in IR control room ______________________________________________________________________   History of Present Illness: Corey Park is a 49 y.o. male with past medical history significant for DM, HTN, CHF, CKD V and current Covid infection who presented to the ED on 1/31 with complaints of fatigue, fluid buildup and persistent hiccups. He was admitted for further evaluation and management and nephrology was consulted who plans to initiate HD - IR has been asked to place a tunneled HD catheter for long term HD access.  Patient seen in his room, he reports frustration with his persistent hiccups as well as being unable to eat or drink anything today. He states he was told his procedure would be this morning and is very upset that it may be later this afternoon due to him being covid (+). He is admittedly reluctant to begin HD but agrees to proceed with HD catheter placement.  Allergies and medications reviewed.   Review of Systems: A 12 point ROS discussed and pertinent positives are indicated in the HPI above.  All other systems are negative.  Review of Systems  Constitutional: Positive for appetite change and fatigue. Negative for chills and fever.  HENT:       (+) hiccups  Respiratory: Negative for cough and shortness of breath.   Cardiovascular: Negative for chest pain.    Gastrointestinal: Negative for abdominal pain, diarrhea, nausea and vomiting.  Neurological: Negative for dizziness and headaches.    Vital Signs: BP (!) 139/96 (BP Location: Left Arm)   Pulse 74   Temp 98.2 F (36.8 C) (Oral)   Resp 18   Ht 6\' 2"  (1.88 m)   Wt 242 lb 8.1 oz (110 kg)   SpO2 96%   BMI 31.14 kg/m   Physical Exam Vitals reviewed.  Constitutional:      General: He is not in acute distress.    Appearance: He is ill-appearing.     Comments: Persistent hiccups during exam  HENT:     Head: Normocephalic.     Mouth/Throat:     Mouth: Mucous membranes are moist.     Pharynx: Oropharynx is clear. No oropharyngeal exudate or posterior oropharyngeal erythema.  Cardiovascular:     Rate and Rhythm: Normal rate and regular rhythm.  Pulmonary:     Effort: Pulmonary effort is normal.     Breath sounds: Normal breath sounds.  Abdominal:     General: There is no distension.     Palpations: Abdomen is soft.     Tenderness: There is no abdominal tenderness.  Skin:    General: Skin is warm and dry.  Neurological:     Mental Status: He is alert and oriented to person, place, and time.  Psychiatric:        Mood and Affect: Mood normal.        Behavior: Behavior normal.        Thought Content:  Thought content normal.        Judgment: Judgment normal.      Imaging reviewed.   Labs:  COAGS: No results for input(s): INR, APTT in the last 8760 hours.  BMP: Recent Labs    01/16/19 1517 07/27/19 1851 07/28/19 0203 07/29/19 0336  NA 141 137 138 136  K 4.2 4.2 4.2 4.2  CL 106 105 107 104  CO2 19* 18* 18* 17*  GLUCOSE 105* 164* 101* 99  BUN 62* 68* 68* 71*  CALCIUM 8.1* 7.5* 7.5* 7.3*  CREATININE 6.32* 9.06* 9.02* 9.22*  GFRNONAA 10* 6* 6* 6*  GFRAA 11* 7* 7* 7*       Electronically Signed: Joaquim Nam, PA-C 07/29/2019, 11:43 AM   I spent a total of 15 minutes in face to face in clinical consultation, greater than 50% of which was  counseling/coordinating care for venous access.

## 2019-07-29 NOTE — Sedation Documentation (Signed)
Patient states hiccups since Thursday

## 2019-07-29 NOTE — Procedures (Signed)
Interventional Radiology Procedure:   Indications: ESRD and starting dialysis   Procedure: Tunneled dialysis catheter placement  Findings: Right jugular Palindrome (23 cm), tip in upper right atrium  Complications: None     EBL: less than 10 ml  Plan: Catheter is ready to use.    Francessca Friis R. Anselm Pancoast, MD  Pager: [phone]

## 2019-07-29 NOTE — Progress Notes (Signed)
Renal Navigator received notification from Nephrologist/Dr. Hollie Salk that patient needs referral for OP HD treatment.  Navigator has attempted to contact patient on cell phone (number in [place]) and room phone to discuss but it appears he is getting his tunneled HD catheter today. Renal Navigator will call patient again at a later time today or tomorrow. He will need treatment at [place] [facility] after 21 days past positive test. Renal Navigator will complete this referral now and determine where his home clinic will be once we have a chance to discuss.   Alphonzo Cruise, [place] Renal Navigator [phone]

## 2019-07-30 ENCOUNTER — Encounter: Payer: Self-pay | Admitting: Surgery

## 2019-07-30 DIAGNOSIS — N186 End stage renal disease: Secondary | ICD-10-CM

## 2019-07-30 LAB — CBC WITH DIFFERENTIAL/PLATELET
Abs Immature Granulocytes: 0.01 10*3/uL (ref 0.00–0.07)
Basophils Absolute: 0 10*3/uL (ref 0.0–0.1)
Basophils Relative: 0 %
Eosinophils Absolute: 0 10*3/uL (ref 0.0–0.5)
Eosinophils Relative: 0 %
HCT: 24.4 % — ABNORMAL LOW (ref 39.0–52.0)
Hemoglobin: 8 g/dL — ABNORMAL LOW (ref 13.0–17.0)
Immature Granulocytes: 0 %
Lymphocytes Relative: 17 %
Lymphs Abs: 0.6 10*3/uL — ABNORMAL LOW (ref 0.7–4.0)
MCH: 28.9 pg (ref 26.0–34.0)
MCHC: 32.8 g/dL (ref 30.0–36.0)
MCV: 88.1 fL (ref 80.0–100.0)
Monocytes Absolute: 0.2 10*3/uL (ref 0.1–1.0)
Monocytes Relative: 5 %
Neutro Abs: 2.7 10*3/uL (ref 1.7–7.7)
Neutrophils Relative %: 78 %
Platelets: 101 10*3/uL — ABNORMAL LOW (ref 150–400)
RBC: 2.77 MIL/uL — ABNORMAL LOW (ref 4.22–5.81)
RDW: 13.8 % (ref 11.5–15.5)
WBC: 3.4 10*3/uL — ABNORMAL LOW (ref 4.0–10.5)
nRBC: 0 % (ref 0.0–0.2)

## 2019-07-30 LAB — MAGNESIUM: Magnesium: 1.5 mg/dL — ABNORMAL LOW (ref 1.7–2.4)

## 2019-07-30 LAB — GLUCOSE, CAPILLARY
Glucose-Capillary: 104 mg/dL — ABNORMAL HIGH (ref 70–99)
Glucose-Capillary: 128 mg/dL — ABNORMAL HIGH (ref 70–99)
Glucose-Capillary: 139 mg/dL — ABNORMAL HIGH (ref 70–99)
Glucose-Capillary: 103 mg/dL — ABNORMAL HIGH (ref 70–99)
Glucose-Capillary: 180 mg/dL — ABNORMAL HIGH (ref 70–99)

## 2019-07-30 LAB — COMPREHENSIVE METABOLIC PANEL
ALT: 15 U/L (ref 0–44)
AST: 26 U/L (ref 15–41)
Albumin: 2.1 g/dL — ABNORMAL LOW (ref 3.5–5.0)
Alkaline Phosphatase: 55 U/L (ref 38–126)
Anion gap: 12 (ref 5–15)
BUN: 58 mg/dL — ABNORMAL HIGH (ref 6–20)
CO2: 18 mmol/L — ABNORMAL LOW (ref 22–32)
Calcium: 6.9 mg/dL — ABNORMAL LOW (ref 8.9–10.3)
Chloride: 105 mmol/L (ref 98–111)
Creatinine, Ser: 8.72 mg/dL — ABNORMAL HIGH (ref 0.61–1.24)
GFR calc Af Amer: 7 mL/min — ABNORMAL LOW (ref >60)
GFR calc non Af Amer: 6 mL/min — ABNORMAL LOW (ref >60)
Glucose, Bld: 102 mg/dL — ABNORMAL HIGH (ref 70–99)
Potassium: 4 mmol/L (ref 3.5–5.1)
Sodium: 135 mmol/L (ref 135–145)
Total Bilirubin: 0.6 mg/dL (ref 0.3–1.2)
Total Protein: 5.7 g/dL — ABNORMAL LOW (ref 6.5–8.1)

## 2019-07-30 LAB — C-REACTIVE PROTEIN: CRP: 3.7 mg/dL — ABNORMAL HIGH (ref <1.0)

## 2019-07-30 LAB — FERRITIN: Ferritin: 236 ng/mL (ref 24–336)

## 2019-07-30 LAB — D-DIMER, QUANTITATIVE: D-Dimer, Quant: 1.11 ug/mL-FEU — ABNORMAL HIGH (ref 0.00–0.50)

## 2019-07-30 MED ORDER — INFLUENZA VAC SPLIT QUAD 0.5 ML IM SUSY
0.5000 mL | PREFILLED_SYRINGE | Freq: Once | INTRAMUSCULAR | Status: DC
  Filled 2019-07-30: qty 0.5

## 2019-07-30 MED ORDER — SODIUM CHLORIDE 0.9 % IV SOLN
200.0000 mg | Freq: Once | INTRAVENOUS | Status: AC
  Administered 2019-07-30: 200 mg via INTRAVENOUS
  Filled 2019-07-30: qty 200

## 2019-07-30 MED ORDER — SODIUM CHLORIDE 0.9 % IV SOLN
100.0000 mg | Freq: Every day | INTRAVENOUS | Status: AC
  Administered 2019-07-31 – 2019-08-03 (×4): 100 mg via INTRAVENOUS
  Filled 2019-07-30 (×4): qty 20

## 2019-07-30 MED ORDER — DARBEPOETIN ALFA 100 MCG/0.5ML IJ SOSY
100.0000 ug | PREFILLED_SYRINGE | INTRAMUSCULAR | Status: DC
  Filled 2019-07-30: qty 0.5

## 2019-07-30 MED ORDER — DEXAMETHASONE 6 MG PO TABS
6.0000 mg | ORAL_TABLET | Freq: Every day | ORAL | Status: DC
  Administered 2019-07-30 – 2019-08-03 (×5): 6 mg via ORAL
  Filled 2019-07-30 (×5): qty 1

## 2019-07-30 NOTE — Progress Notes (Addendum)
PROGRESS NOTE                                                                                                                                                                                                             Patient Demographics:    Corey Park, is a 49 y.o. male, DOB - Mar 26, 1971, VFI:[id number]  Outpatient Primary MD for the patient is Patient, No Pcp Per   Admit date - 07/27/2019   LOS - 3  Chief Complaint  Patient presents with  . Constipation  . Hypertension  . Leg Swelling       Brief Narrative: Patient is a 49 y.o. 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  See below for further details.   Subjective:    [name] today was seen at hemodialysis-he was lying comfortably in bed-denied any shortness of breath.  Continues to have hiccups.   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  Volume status improving.  Moderate pulmonary hypertension: Likely secondary to left-sided heart failure-stable for outpatient follow-up and monitoring.  Acute Hypoxic Resp Failure due to Covid 19 Viral pneumonia: CRP worsening-appears to have possible pneumonia in the left lung base on chest x-ray-will go and start remdesivir/steroids given risk for severe disease.  Fever: afebrile  O2 requirements:  SpO2: 91 % O2 Flow Rate (L/min): 2 L/min   COVID-19 Labs: Recent Labs    07/28/19 0203 07/29/19 0336 07/30/19 0509  DDIMER 1.06* 0.88* 1.11*  FERRITIN 44 90 236  CRP 1.5* 2.0* 3.7*         Component Value Date/Time   BNP 2,665.5 (H) 07/27/2019 1851    No results for input(s): PROCALCITON in the last 168 hours.  Lab Results  Component Value Date   [place] NEGATIVE 12/21/2018     COVID-19 Medications: Steroids:2/3>> Remdesivir:2/3>>  Prone/Incentive Spirometry: encouraged incentive spirometry use 3-4/hour.  DVT Prophylaxis  :   Heparin   HTN: Controlled-continue Coreg  Hiccups:?  Due to uremia-supportive care-will try prn Reglan if not better over the next few days  Anemia: Secondary to CKD- Per nephrology  DM-2 (A1C 6.0 on 07/28/19): CBG stable-on SSI-does not appear to be on any oral hypoglycemics as outpatient.  CBG (last 3)  Recent Labs    07/30/19 0543 07/30/19 0734 07/30/19 1245  GLUCAP 104* 128* 139*   Obesity: Estimated body mass index is 31.45 kg/m as calculated from the following:   Height as of this encounter: 6\' 2"  (1.88 m).   Weight as of this encounter: 111.1 kg.   Consults  : Cardiology/nephrology  Procedures  :  2/2: Tunneled HD catheter placement by IR  ABG:    Component Value Date/Time   TCO2 24 01/27/2018 1309    Vent Settings: N/A  Condition -Stable  Family Communication  :  Mother updated over the phone  Code Status :  Full Code  Diet :  Diet Order            Diet renal with fluid restriction Fluid restriction: 1200 mL Fluid; Room service appropriate? Yes; Fluid consistency: Thin  Diet effective now               Disposition Plan  :  Remain hospitalized-  Barriers to discharge: Hypoxia requiring O2 supplementation/complete 5 days of IV Remdesivir  Antimicorbials  :    Anti-infectives (From admission, onward)   Start     Dose/Rate Route Frequency Ordered Stop   07/29/19 1230  ceFAZolin (ANCEF) IVPB 2g/100 mL premix     2 g 200 mL/hr over 30 Minutes Intravenous  Once 07/29/19 1216 07/29/19 1250   07/29/19 1217  ceFAZolin (ANCEF) 2-4 GM/100ML-% IVPB    Note to Pharmacy: Kandy Garrison   : cabinet  override      07/29/19 1217 07/30/19 0029   07/27/19 1915  cefTRIAXone (ROCEPHIN) 1 g in sodium chloride 0.9 % 100 mL IVPB     1 g 200 mL/hr over 30 Minutes Intravenous  Once 07/27/19 1909 07/27/19 2027   07/27/19 1915  azithromycin (ZITHROMAX) 500 mg in sodium chloride 0.9 % 250 mL IVPB  Status:  Discontinued     500 mg 250 mL/hr over 60 Minutes Intravenous  Once 07/27/19 1909 07/27/19 2016      Inpatient Medications  Scheduled Meds: . atorvastatin  20 mg Oral q1800  . carvedilol  25 mg Oral BID WC  . Chlorhexidine Gluconate Cloth  6 each Topical Q0600  . [START ON 07/31/2019] darbepoetin (ARANESP) injection - DIALYSIS  100 mcg Intravenous Q Thu-HD  . ferrous sulfate  325 mg Oral Daily  . furosemide  60 mg Intravenous Daily  . gabapentin  100 mg Oral TID  . heparin injection (subcutaneous)  5,000 Units Subcutaneous Q8H  . insulin aspart  0-6 Units Subcutaneous TID WC  . magnesium oxide  400 mg Oral BID  . polyethylene glycol  17 g Oral Daily  . senna-docusate  2 tablet Oral BID   Continuous Infusions: . sodium chloride    . sodium chloride     PRN Meds:.sodium chloride, sodium chloride, acetaminophen, alteplase, bisacodyl, heparin, hydrALAZINE, lidocaine (PF), lidocaine-prilocaine, pentafluoroprop-tetrafluoroeth   Time Spent in minutes  25  See all Orders from today for further details   Oren Binet M.D on 07/30/2019 at 3:48 PM  To page go to [email or web address] - use universal password  [facility] -  Office  [phone]    Objective:   Vitals:   07/30/19 1014 07/30/19  1045 07/30/19 1135 07/30/19 1200  BP: (!) 151/93 (!) 155/97 (!) 157/91 (!) 156/96  Pulse: 78 83 82 88  Resp: 18 18 18 16   Temp:    100.1 F (37.8 C)  TempSrc:    Oral  SpO2:    91%  Weight:      Height:        Wt Readings from Last 3 Encounters:  07/30/19 111.1 kg  01/16/19 105.7 kg  12/24/18 106.5 kg     Intake/Output Summary (Last 24 hours) at 07/30/2019 1548 Last data filed at  07/30/2019 1500 Gross per 24 hour  Intake 500 ml  Output 1600 ml  Net -1100 ml     Physical Exam Gen Exam:Alert awake-not in any distress HEENT:atraumatic, normocephalic Chest: B/L clear to auscultation anteriorly CVS:S1S2 regular Abdomen:soft non tender, non distended Extremities:trace edema Neurology: Non focal Skin: no rash   Data Review:    CBC Recent Labs  Lab 07/27/19 1851 07/29/19 0336 07/29/19 1316 07/30/19 0509  WBC 4.9 3.3* 2.7* 3.4*  HGB 8.9* 8.1* 7.8* 8.0*  HCT 27.7* 25.1* 24.1* 24.4*  PLT 146* 99* 100* 101*  MCV 92.0 90.6 89.6 88.1  MCH 29.6 29.2 29.0 28.9  MCHC 32.1 32.3 32.4 32.8  RDW 14.1 14.1 14.0 13.8  LYMPHSABS 0.5* 0.5*  --  0.6*  MONOABS 0.3 0.1  --  0.2  EOSABS 0.0 0.1  --  0.0  BASOSABS 0.0 0.0  --  0.0    Chemistries  Recent Labs  Lab 07/27/19 1851 07/28/19 0203 07/29/19 0336 07/29/19 1400 07/30/19 0509  NA 137 138 136 131* 135  K 4.2 4.2 4.2 4.0 4.0  CL 105 107 104 104 105  CO2 18* 18* 17* 17* 18*  GLUCOSE 164* 101* 99 167* 102*  BUN 68* 68* 71* 71* 58*  CREATININE 9.06* 9.02* 9.22* 9.15* 8.72*  CALCIUM 7.5* 7.5* 7.3* 6.6* 6.9*  MG  --  1.6* 1.4*  --  1.5*  AST 23 23 21   --  26  ALT 18 18 16   --  15  ALKPHOS 56 51 47  --  55  BILITOT 0.5 0.6 0.6  --  0.6   ------------------------------------------------------------------------------------------------------------------ No results for input(s): CHOL, HDL, LDLCALC, TRIG, CHOLHDL, LDLDIRECT in the last 72 hours.  Lab Results  Component Value Date   HGBA1C 6.0 (H) 07/28/2019   ------------------------------------------------------------------------------------------------------------------ No results for input(s): TSH, T4TOTAL, T3FREE, THYROIDAB in the last 72 hours.  Invalid input(s): FREET3 ------------------------------------------------------------------------------------------------------------------ Recent Labs    07/29/19 0336 07/30/19 0509  FERRITIN 90 236     Coagulation profile No results for input(s): INR, PROTIME in the last 168 hours.  Recent Labs    07/29/19 0336 07/30/19 0509  DDIMER 0.88* 1.11*    Cardiac Enzymes No results for input(s): CKMB, TROPONINI, MYOGLOBIN in the last 168 hours.  Invalid input(s): CK ------------------------------------------------------------------------------------------------------------------    Component Value Date/Time   BNP 2,665.5 (H) 07/27/2019 1851    Micro Results Recent Results (from the past 240 hour(s))  Blood culture (routine x 2)     Status: None (Preliminary result)   Collection Time: 07/27/19  7:30 PM   Specimen: BLOOD  Result Value Ref Range Status   Specimen Description BLOOD RIGHT ANTECUBITAL  Final   Special Requests   Final    BOTTLES DRAWN AEROBIC AND ANAEROBIC Blood Culture results may not be optimal due to an inadequate volume of blood received in culture bottles   Culture   Final    NO  GROWTH 3 DAYS Performed at [facility], [place] [address] [place], [place] [address]    Report Status PENDING  Incomplete  Blood culture (routine x 2)     Status: None (Preliminary result)   Collection Time: 07/27/19  7:57 PM   Specimen: BLOOD RIGHT HAND  Result Value Ref Range Status   Specimen Description BLOOD RIGHT HAND  Final   Special Requests   Final    BOTTLES DRAWN AEROBIC ONLY Blood Culture results may not be optimal due to an inadequate volume of blood received in culture bottles   Culture   Final    NO GROWTH 3 DAYS Performed at [facility], [place] [address], [place], [place] [address]    Report Status PENDING  Incomplete    Radiology Reports DG Chest 2 View  Result Date: 07/27/2019 CLINICAL DATA:  Leg swelling and constipation.  Fever. EXAM: CHEST - 2 VIEW COMPARISON:  December 21, 2018 FINDINGS: Cardiomegaly. The hila and mediastinum are normal. No pneumothorax. Mild opacity in the right base is somewhat platelike in appearance. No other infiltrates are  noted. No overt edema. IMPRESSION: Platelike opacity in the right base may represent atelectasis but infiltrate such as pneumonia is not excluded. Cardiomegaly. Electronically Signed   By: Dorise Bullion III M.D   On: 07/27/2019 18:57   IR Fluoro Guide CV Line Right  Result Date: 07/29/2019 INDICATION: 49 year old with acute on chronic renal failure with uremia. EXAM: FLUOROSCOPIC AND ULTRASOUND GUIDED PLACEMENT OF A TUNNELED DIALYSIS CATHETER Physician: Stephan Minister. Anselm Pancoast, MD MEDICATIONS: Ancef 2 g; The antibiotic was administered within an appropriate time interval prior to skin puncture. ANESTHESIA/SEDATION: Versed 1.5 mg IV; Fentanyl 75 mcg IV; Moderate Sedation Time:  18 minutes The patient was continuously monitored during the procedure by the interventional radiology nurse under my direct supervision. FLUOROSCOPY TIME:  Fluoroscopy Time: 18 seconds, 2 mGy COMPLICATIONS: None immediate. PROCEDURE: The procedure was explained to the patient. The risks and benefits of the procedure were discussed and the patient's questions were addressed. Informed consent was obtained from the patient. The patient was placed supine on the interventional table. Ultrasound confirmed a patent right internal jugular vein. Ultrasound images were obtained for documentation. The right neck and chest was prepped and draped in a sterile fashion. The right neck was anesthetized with 1% lidocaine. Maximal barrier sterile technique was utilized including caps, mask, sterile gowns, sterile gloves, sterile drape, hand hygiene and skin antiseptic. A small incision was made with #11 blade scalpel. A 21 gauge needle directed into the right internal jugular vein with ultrasound guidance. A micropuncture dilator set was placed. A 23 cm tip to cuff Palindrome catheter was selected. The skin below the right clavicle was anesthetized and a small incision was made with an #11 blade scalpel. A subcutaneous tunnel was formed to the vein dermatotomy site.  The catheter was brought through the tunnel. The vein dermatotomy site was dilated to accommodate a peel-away sheath. The catheter was placed through the peel-away sheath and directed into the central venous structures. The tip of the catheter was placed in the right atrium with fluoroscopy. Fluoroscopic images were obtained for documentation. Both lumens were found to aspirate and flush well. The proper amount of heparin was flushed in both lumens. The vein dermatotomy site was closed using a single layer of absorbable suture and Dermabond. Gel-Foam placed in subcutaneous tract. The catheter was secured to the skin using Prolene suture. IMPRESSION: Successful placement of a right jugular tunneled  dialysis catheter using ultrasound and fluoroscopic guidance. Electronically Signed   By: Markus Daft M.D.   On: 07/29/2019 15:20   IR US Guide Vasc Access Right  Result Date: 07/29/2019 INDICATION: 49 year old with acute on chronic renal failure with uremia. EXAM: FLUOROSCOPIC AND ULTRASOUND GUIDED PLACEMENT OF A TUNNELED DIALYSIS CATHETER Physician: Stephan Minister. Anselm Pancoast, MD MEDICATIONS: Ancef 2 g; The antibiotic was administered within an appropriate time interval prior to skin puncture. ANESTHESIA/SEDATION: Versed 1.5 mg IV; Fentanyl 75 mcg IV; Moderate Sedation Time:  18 minutes The patient was continuously monitored during the procedure by the interventional radiology nurse under my direct supervision. FLUOROSCOPY TIME:  Fluoroscopy Time: 18 seconds, 2 mGy COMPLICATIONS: None immediate. PROCEDURE: The procedure was explained to the patient. The risks and benefits of the procedure were discussed and the patient's questions were addressed. Informed consent was obtained from the patient. The patient was placed supine on the interventional table. Ultrasound confirmed a patent right internal jugular vein. Ultrasound images were obtained for documentation. The right neck and chest was prepped and draped in a sterile fashion. The  right neck was anesthetized with 1% lidocaine. Maximal barrier sterile technique was utilized including caps, mask, sterile gowns, sterile gloves, sterile drape, hand hygiene and skin antiseptic. A small incision was made with #11 blade scalpel. A 21 gauge needle directed into the right internal jugular vein with ultrasound guidance. A micropuncture dilator set was placed. A 23 cm tip to cuff Palindrome catheter was selected. The skin below the right clavicle was anesthetized and a small incision was made with an #11 blade scalpel. A subcutaneous tunnel was formed to the vein dermatotomy site. The catheter was brought through the tunnel. The vein dermatotomy site was dilated to accommodate a peel-away sheath. The catheter was placed through the peel-away sheath and directed into the central venous structures. The tip of the catheter was placed in the right atrium with fluoroscopy. Fluoroscopic images were obtained for documentation. Both lumens were found to aspirate and flush well. The proper amount of heparin was flushed in both lumens. The vein dermatotomy site was closed using a single layer of absorbable suture and Dermabond. Gel-Foam placed in subcutaneous tract. The catheter was secured to the skin using Prolene suture. IMPRESSION: Successful placement of a right jugular tunneled dialysis catheter using ultrasound and fluoroscopic guidance. Electronically Signed   By: Markus Daft M.D.   On: 07/29/2019 15:20   ECHOCARDIOGRAM LIMITED  Result Date: 07/28/2019   ECHOCARDIOGRAM LIMITED REPORT   Patient Name:   SABASTIEN TYLER Date of Exam: 07/28/2019 Medical Rec #:  [id number]       Height:       74.0 in Accession #:    [id number]      Weight:       240.3 lb Date of Birth:  [birth year]       BSA:          2.35 m Patient Age:    15 years        BP:           120/86 mmHg Patient Gender: M               HR:           76 bpm. Exam Location:  Inpatient  Procedure: Limited Echo, Cardiac Doppler and Color Doppler  Indications:    CHF  History:        Patient has prior history of Echocardiogram examinations, most  recent 06/17/2018. Covid positive, CKD.  Sonographer:    Dustin Flock Referring Phys: [id number] [place]  1. Left ventricular ejection fraction, by visual estimation, is 20 to 25%. The left ventricle has severely decreased function. There is severely increased left ventricular wall thickness.  2. Elevated left atrial pressure.  3. Left ventricular diastolic parameters are consistent with Grade II diastolic dysfunction (pseudonormalization).  4. Mildly dilated left ventricular internal cavity size.  5. The left ventricle demonstrates global hypokinesis.  6. Global right ventricle has severely reduced systolic function.The right ventricular size is normal.  7. Trivial pericardial effusion is present.  8. The mitral valve is normal in structure. Trivial mitral valve regurgitation. No evidence of mitral stenosis.  9. The tricuspid valve was normal in structure. Tricuspid valve regurgitation is mild. 10. Mild aortic valve sclerosis without stenosis. 11. The pulmonic valve was normal in structure. Pulmonic valve regurgitation is mild by color flow Doppler. 12. Aortic dilatation noted. 13. There is mild dilatation of the aortic root measuring 39 mm. 14. Moderately elevated pulmonary artery systolic pressure. 15. The inferior vena cava is dilated in size with <50% respiratory variability, suggesting right atrial pressure of 15 mmHg. 16. Severe global reduction in LV systolic function; severe LVH; mild LVE; severe RV dysfunction; mild TR; moderate pulmonary hypertension. FINDINGS  Left Ventricle: Left ventricular ejection fraction, by visual estimation, is 20 to 25%. The left ventricle has severely decreased function. The left ventricle demonstrates global hypokinesis. The left ventricular internal cavity size was mildly dilated left ventricle. There is severely increased left ventricular wall  thickness. Left ventricular diastolic parameters are consistent with Grade II diastolic dysfunction (pseudonormalization). Elevated left atrial pressure. Right Ventricle: The right ventricular size is normal.Global RV systolic function is has severely reduced systolic function. The tricuspid regurgitant velocity is 2.94 m/s, and with an assumed right atrial pressure of 8 mmHg, the estimated right ventricular systolic pressure is moderately elevated at 42.5 mmHg. Left Atrium: Left atrial size was normal in size. Right Atrium: Right atrial size was normal in size. Right atrial pressure is estimated at 8 mmHg. Pericardium: Trivial pericardial effusion is present is seen. Trivial pericardial effusion is present. Mitral Valve: The mitral valve is normal in structure. There is mild thickening of the mitral valve leaflet(s). No evidence of mitral valve stenosis by observation. MV Area by PHT, 4.49 cm. MV PHT, 49.01 msec. Trivial mitral valve regurgitation. Tricuspid Valve: The tricuspid valve is normal in structure. Tricuspid valve regurgitation is mild. Aortic Valve: The aortic valve is tricuspid. Aortic valve regurgitation is not visualized. Mild aortic valve sclerosis is present, with no evidence of aortic valve stenosis. Pulmonic Valve: The pulmonic valve was normal in structure. Pulmonic valve regurgitation is mild by color flow Doppler. Pulmonic regurgitation is mild by color flow Doppler. Aorta: Aortic dilatation noted. There is mild dilatation of the aortic root measuring 39 mm. Venous: The inferior vena cava is dilated in size with less than 50% respiratory variability, suggesting right atrial pressure of 15 mmHg. Shunts: There is no evidence of a patent foramen ovale.  Additional Comments: Severe global reduction in LV systolic function; severe LVH; mild LVE; severe RV dysfunction; mild TR; moderate pulmonary hypertension.  LEFT VENTRICLE          Normals PLAX 2D LVIDd:         5.92 cm  3.6 cm   Diastology                  Normals LVIDs:  4.48 cm  1.7 cm   LV e' lateral:   4.70 cm/s 6.42 cm/s LV PW:         1.53 cm  1.4 cm   LV E/e' lateral: 12.4      15.4 LV IVS:        1.72 cm  1.3 cm   LV e' medial:    3.24 cm/s 6.96 cm/s LVOT diam:     2.20 cm  2.0 cm   LV E/e' medial:  18.0      6.96 LV SV:         83 ml    79 ml LV SV Index:   34.46    45 ml/m2 LVOT Area:     3.80 cm 3.14 cm2  LEFT ATRIUM         Index LA diam:    3.90 cm 1.66 cm/m  AORTIC VALVE             Normals LVOT Vmax:   79.60 cm/s LVOT Vmean:  48.000 cm/s 75 cm/s LVOT VTI:    0.130 m     25.3 cm  AORTA                 Normals Ao Root diam: 3.90 cm 31 mm MITRAL VALVE              Normals   TRICUSPID VALVE             Normals MV Area (PHT): 4.49 cm             TR Peak grad:   34.5 mmHg MV PHT:        49.01 msec 55 ms     TR Vmax:        330.00 cm/s 288 cm/s MV Decel Time: 169 msec   187 ms MV E velocity: 58.30 cm/s 103 cm/s  SHUNTS MV A velocity: 38.10 cm/s 70.3 cm/s Systemic VTI:  0.13 m MV E/A ratio:  1.53       1.5       Systemic Diam: 2.20 cm  Kirk Ruths MD Electronically signed by Kirk Ruths MD Signature Date/Time: 07/28/2019/2:52:57 PMThe mitral valve is normal in structure.    Final

## 2019-07-30 NOTE — Progress Notes (Signed)
  [facility] Progress Note   Assessment/ Plan:   1.  AKI on CKD V with uremia--> ESRD: Started HD.  s/p TDC 07/29/19, HD #1 2/2, HD #2 2/3, HD #3 2/4.  CLIP will start (have d/w renal navigator).  Perm access will need to be when pt off COVID precautions  2.  COVID: per primary  3.  HTN: reasonably well-controlled  4.  Anemia: ESA, starting darbepoetin 100 q Thursday and getting iron panel  5.  CKD-MB: binders  6.  Dispo: pending  Subjective:    S/p HD #2 today, still having hiccups.     Objective:   BP (!) 156/96 (BP Location: Left Arm)   Pulse 88   Temp 100.1 F (37.8 C) (Oral)   Resp 16   Ht 6\' 2"  (1.88 m)   Wt 111.1 kg   SpO2 91%   BMI 31.45 kg/m   Physical Exam: GEN: NAD, hiccuping in bed HEENT: EOMI PERRL wearing mask NECK no JVD PULM normal WOB, muffled in the bases CV RRR no m/r/g ABD soft, nontender, NABS EXT no LE edema NEURO AAO x 3 SKIN warm and dry ACCESS: R IJ [facility]   Labs: BMET Recent Labs  Lab 07/27/19 1851 07/28/19 0203 07/29/19 0336 07/29/19 1400 07/30/19 0509  NA 137 138 136 131* 135  K 4.2 4.2 4.2 4.0 4.0  CL 105 107 104 104 105  CO2 18* 18* 17* 17* 18*  GLUCOSE 164* 101* 99 167* 102*  BUN 68* 68* 71* 71* 58*  CREATININE 9.06* 9.02* 9.22* 9.15* 8.72*  CALCIUM 7.5* 7.5* 7.3* 6.6* 6.9*  PHOS  --   --   --  4.6  --    CBC Recent Labs  Lab 07/27/19 1851 07/29/19 0336 07/29/19 1316 07/30/19 0509  WBC 4.9 3.3* 2.7* 3.4*  NEUTROABS 4.1 2.6  --  2.7  HGB 8.9* 8.1* 7.8* 8.0*  HCT 27.7* 25.1* 24.1* 24.4*  MCV 92.0 90.6 89.6 88.1  PLT 146* 99* 100* 101*      Medications:    . atorvastatin  20 mg Oral q1800  . carvedilol  25 mg Oral BID WC  . Chlorhexidine Gluconate Cloth  6 each Topical Q0600  . ferrous sulfate  325 mg Oral Daily  . furosemide  60 mg Intravenous Daily  . gabapentin  100 mg Oral TID  . heparin injection (subcutaneous)  5,000 Units Subcutaneous Q8H  . insulin aspart  0-6 Units  Subcutaneous TID WC  . magnesium oxide  400 mg Oral BID  . polyethylene glycol  17 g Oral Daily  . senna-docusate  2 tablet Oral BID     Madelon Lips MD 07/30/2019, 2:11 PM

## 2019-07-30 NOTE — Progress Notes (Signed)
Pharmacy Antibiotic Note  Corey Park is a 49 y.o. male admitted on 07/27/2019 with generalized weakness and fluid overload and found to have AKI on CKD V with progression to ESRD, started on HD. Pt also has acute hypoxic respiratory failure due to COVID pneumonia. Pharmacy has been consulted for remdesivir dosing.  WBC 3.4, 100.45F; AST/ALT 26/15 (WNL)  Plan: Remdesivir 200 mg IV X 1, followed by 100 mg IV daily X 4 days Monitor LFTs daily  Height: 6\' 2"  (188 cm) Weight: 244 lb 14.9 oz (111.1 kg) IBW/kg (Calculated) : 82.2  Temp (24hrs), Avg:99.3 F (37.4 C), Min:98.5 F (36.9 C), Max:100.1 F (37.8 C)  Recent Labs  Lab 07/27/19 1851 07/28/19 0203 07/29/19 0336 07/29/19 1316 07/29/19 1400 07/30/19 0509  WBC 4.9  --  3.3* 2.7*  --  3.4*  CREATININE 9.06* 9.02* 9.22*  --  9.15* 8.72*  LATICACIDVEN 1.7  --   --   --   --   --     Estimated Creatinine Clearance: 13.7 mL/min (A) (by C-G formula based on SCr of 8.72 mg/dL (H)).    No Known Allergies  Microbiology results: 1/31 BCx X 2: NGTD 2/2 HBV panel: negative 1/31 COVID: positive  Thank you for allowing pharmacy to be a part of this patient's care.  Gillermina Hu, PharmD, BCPS, [facility] Clinical Pharmacist 07/30/2019 4:20 PM

## 2019-07-30 NOTE — Progress Notes (Signed)
PT Cancellation Note  Patient Details Name: Bricyn Labrada MRN: [id number] DOB: 12/07/1970   Cancelled Treatment:    Reason Eval/Treat Not Completed: (P) Patient at procedure or test/unavailable Pt off floor for HD. PT will follow back for Evaluation this afternoon as able.  Karlene Southard B. Migdalia Dk PT, DPT Acute Rehabilitation Services Pager [phone] Office [phone]    [place] 07/30/2019, 8:44 AM

## 2019-07-30 NOTE — Progress Notes (Signed)
Pt off floor to HD.

## 2019-07-30 NOTE — Progress Notes (Signed)
Attempted to call patient. Patient did not answer, though he is listed in his room-assume he is actually in [facility] for HD. Navigator will attempt to call patient again later.  Alphonzo Cruise, [place] Renal Navigator [phone]

## 2019-07-30 NOTE — Evaluation (Signed)
Physical Therapy Evaluation Patient Details Name: Corey Park MRN: [id number] DOB: 1970-12-17 Today's Date: 07/30/2019   History of Present Illness  49 y.o. 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 intake. Pt also stopped taking medication including Lasix. Pt admitted 1/31 for COVID, Acute systolic CHF exacerbation and elevated troponin.   Clinical Impression  PTA pt living with 49 yo son in apartment with level entry. Pt independent in mobility and ADLs, uses [employer] for transportation. Pt is currently limited in safe mobility by slight oxygen desaturation with mobility (see General Comments) in presence of generalized weakness and decreased endurance. Pt is supervision for bed mobility, min A for transfers and min A x2 for HHA with maximal verbal cuing for ambulation in hallway. PT is recommending HHPT at discharge to work on balance and endurance. Pt is concerned about his son having COVID and would like assistance in getting him tested. If son is COVID negative will send him to live with his grandmother during isolation period. PT will continue to follow acutely.     Follow Up Recommendations Home health PT    Equipment Recommendations  None recommended by PT       Precautions / Restrictions Precautions Precautions: None Restrictions Weight Bearing Restrictions: No      Mobility  Bed Mobility Overal bed mobility: Needs Assistance Bed Mobility: Supine to Sit     Supine to sit: Supervision     General bed mobility comments: supervision for safety   Transfers Overall transfer level: Needs assistance Equipment used: 1 person hand held assist Transfers: Sit to/from Stand Sit to Stand: Min assist         General transfer comment: minA for power up and steadying   Ambulation/Gait Ambulation/Gait assistance: Min assist;+2 safety/equipment Gait Distance (Feet): 175 Feet Assistive device: 2 person hand held assist Gait Pattern/deviations: Step-through pattern;Drifts right/left Gait velocity: slowed Gait velocity interpretation: <1.8 ft/sec, indicate of risk for recurrent falls General Gait Details: minAx2 for steadying, max verbal cuing for directions due to visual impairments      Balance Overall balance assessment: Mild deficits observed, not formally tested                                           Pertinent Vitals/Pain Pain Assessment: No/denies pain    Home Living Family/patient expects to be discharged to:: Private residence Living Arrangements: Children(16 yo son) Available Help at Discharge: Family;Available PRN/intermittently Type of Home: Apartment Home Access: Level entry     Home Layout: One level Home Equipment: None      Prior Function Level of Independence: Independent         Comments: uses [employer] for transportation         Extremity/Trunk Assessment   Upper Extremity Assessment Upper Extremity Assessment: Generalized weakness    Lower Extremity Assessment Lower Extremity Assessment: Generalized weakness       Communication   Communication: No difficulties  Cognition Arousal/Alertness: Awake/alert Behavior During Therapy: WFL for tasks assessed/performed Overall Cognitive Status: Within Functional Limits for tasks assessed  General Comments General comments (skin integrity, edema, etc.): Pt on RA on entry with SaO2 96%O2 with ambulation SaO2 dropped to 87%O2 quickly rebounding after sitting in recliner.         Assessment/Plan    PT Assessment Patient needs continued PT services  PT Problem List Decreased strength;Decreased mobility       PT Treatment  Interventions Gait training;Functional mobility training;Therapeutic activities;Therapeutic exercise;Balance training;Cognitive remediation;Patient/family education    PT Goals (Current goals can be found in the Care Plan section)  Acute Rehab PT Goals Patient Stated Goal: start an exercise program  PT Goal Formulation: With patient Time For Goal Achievement: 08/13/19 Potential to Achieve Goals: Good    Frequency Min 3X/week   Barriers to discharge Decreased caregiver support         AM-PAC PT "6 Clicks" Mobility  Outcome Measure Help needed turning from your back to your side while in a flat bed without using bedrails?: None Help needed moving from lying on your back to sitting on the side of a flat bed without using bedrails?: A Little Help needed moving to and from a bed to a chair (including a wheelchair)?: None Help needed standing up from a chair using your arms (e.g., wheelchair or bedside chair)?: None Help needed to walk in hospital room?: A Little Help needed climbing 3-5 steps with a railing? : A Little 6 Click Score: 21    End of Session   Activity Tolerance: Patient tolerated treatment well Patient left: in chair;with call bell/phone within reach Nurse Communication: Mobility status PT Visit Diagnosis: Unsteadiness on feet (R26.81);Other abnormalities of gait and mobility (R26.89);Muscle weakness (generalized) (M62.81)    Time: 9518-8416 PT Time Calculation (min) (ACUTE ONLY): 28 min   Charges:   PT Evaluation $PT Eval Moderate Complexity: 1 Mod PT Treatments $Gait Training: 8-22 mins        Myeesha Shane B. Migdalia Dk PT, DPT Acute Rehabilitation Services Pager [phone] Office [phone]   [place] 07/30/2019, 4:52 PM

## 2019-07-30 NOTE — Progress Notes (Signed)
Renal Navigator continues to reach patient. His phone rings busy. Renal Navigator will continue to attempt again until we can discuss OP HD clinic post COVID iso shift.  Alphonzo Cruise, [place] Renal Navigator [phone]

## 2019-07-31 LAB — COMPREHENSIVE METABOLIC PANEL
ALT: 12 U/L (ref 0–44)
AST: 29 U/L (ref 15–41)
Albumin: 1.9 g/dL — ABNORMAL LOW (ref 3.5–5.0)
Alkaline Phosphatase: 50 U/L (ref 38–126)
Anion gap: 11 (ref 5–15)
BUN: 38 mg/dL — ABNORMAL HIGH (ref 6–20)
CO2: 22 mmol/L (ref 22–32)
Calcium: 6.9 mg/dL — ABNORMAL LOW (ref 8.9–10.3)
Chloride: 102 mmol/L (ref 98–111)
Creatinine, Ser: 6.54 mg/dL — ABNORMAL HIGH (ref 0.61–1.24)
GFR calc Af Amer: 11 mL/min — ABNORMAL LOW (ref >60)
GFR calc non Af Amer: 9 mL/min — ABNORMAL LOW (ref >60)
Glucose, Bld: 207 mg/dL — ABNORMAL HIGH (ref 70–99)
Potassium: 3.7 mmol/L (ref 3.5–5.1)
Sodium: 135 mmol/L (ref 135–145)
Total Bilirubin: 0.6 mg/dL (ref 0.3–1.2)
Total Protein: 5.3 g/dL — ABNORMAL LOW (ref 6.5–8.1)

## 2019-07-31 LAB — IRON AND TIBC
Iron: 13 ug/dL — ABNORMAL LOW (ref 45–182)
Saturation Ratios: 5 % — ABNORMAL LOW (ref 17.9–39.5)
TIBC: 238 ug/dL — ABNORMAL LOW (ref 250–450)
UIBC: 225 ug/dL

## 2019-07-31 LAB — CBC WITH DIFFERENTIAL/PLATELET
Abs Immature Granulocytes: 0.01 10*3/uL (ref 0.00–0.07)
Basophils Absolute: 0 10*3/uL (ref 0.0–0.1)
Basophils Relative: 0 %
Eosinophils Absolute: 0 10*3/uL (ref 0.0–0.5)
Eosinophils Relative: 0 %
HCT: 23.6 % — ABNORMAL LOW (ref 39.0–52.0)
Hemoglobin: 7.7 g/dL — ABNORMAL LOW (ref 13.0–17.0)
Immature Granulocytes: 1 %
Lymphocytes Relative: 13 %
Lymphs Abs: 0.2 10*3/uL — ABNORMAL LOW (ref 0.7–4.0)
MCH: 29.3 pg (ref 26.0–34.0)
MCHC: 32.6 g/dL (ref 30.0–36.0)
MCV: 89.7 fL (ref 80.0–100.0)
Monocytes Absolute: 0.1 10*3/uL (ref 0.1–1.0)
Monocytes Relative: 3 %
Neutro Abs: 1.2 10*3/uL — ABNORMAL LOW (ref 1.7–7.7)
Neutrophils Relative %: 83 %
Platelets: 99 10*3/uL — ABNORMAL LOW (ref 150–400)
RBC: 2.63 MIL/uL — ABNORMAL LOW (ref 4.22–5.81)
RDW: 13.8 % (ref 11.5–15.5)
WBC: 1.5 10*3/uL — ABNORMAL LOW (ref 4.0–10.5)
nRBC: 0 % (ref 0.0–0.2)

## 2019-07-31 LAB — MAGNESIUM: Magnesium: 1.6 mg/dL — ABNORMAL LOW (ref 1.7–2.4)

## 2019-07-31 LAB — C-REACTIVE PROTEIN: CRP: 5.5 mg/dL — ABNORMAL HIGH (ref <1.0)

## 2019-07-31 LAB — GLUCOSE, CAPILLARY
Glucose-Capillary: 124 mg/dL — ABNORMAL HIGH (ref 70–99)
Glucose-Capillary: 205 mg/dL — ABNORMAL HIGH (ref 70–99)
Glucose-Capillary: 108 mg/dL — ABNORMAL HIGH (ref 70–99)
Glucose-Capillary: 153 mg/dL — ABNORMAL HIGH (ref 70–99)

## 2019-07-31 LAB — D-DIMER, QUANTITATIVE: D-Dimer, Quant: 1.04 ug/mL-FEU — ABNORMAL HIGH (ref 0.00–0.50)

## 2019-07-31 LAB — FERRITIN: Ferritin: 325 ng/mL (ref 24–336)

## 2019-07-31 MED ORDER — HEPARIN SODIUM (PORCINE) 1000 UNIT/ML IJ SOLN
INTRAMUSCULAR | Status: AC
  Filled 2019-07-31: qty 4

## 2019-07-31 MED ORDER — HYDRALAZINE HCL 25 MG PO TABS
25.0000 mg | ORAL_TABLET | Freq: Three times a day (TID) | ORAL | Status: DC
  Administered 2019-07-31 – 2019-08-03 (×8): 25 mg via ORAL
  Filled 2019-07-31 (×8): qty 1

## 2019-07-31 NOTE — Progress Notes (Signed)
Renal Navigator spoke with patient by phone to explain OP HD COVID isolation shift schedule/plan at [facility]. He states understanding and agreement to treat in isolation for 21 days past positive test, and then informed Navigator that he will need 3rd shift after that time. He states he will "forego dialysis" if he is not given a 3rd shift seat as he works for [employer] and will lose his job that is only being held for him as long as he is out with [place]. Renal Navigator stated understanding, yet explained that there is only one clinic with a 3rd shift seat, and that we will do all we can to get him a seat there, but there may be a period of time that he may have to wait to get a seat in the 3rd shift. He again stated that he will not do dialysis if he cannot get a 3rd shift spot. He states that he has to work in order to take care of his son. He states he is a single father. Renal Navigator explained that his son needs his to be alive in order to take care of him. He states he has not needed HD before, so he will be okay again. Navigator again stated that we will prioritize him as a working patient, but cannot guarantee that there will be a 3rd shift seat when patient is ready to leave the isolation shift at [employer].  Patient asked for Navigator to call his mother with the clinic information. Renal Navigator to call tomorrow and to follow up on the possibility of securing a seat at [facility] 3rd shift after patient is no longer needing to treat in isolation.  Alphonzo Cruise, [place] Renal Navigator [phone]

## 2019-07-31 NOTE — Progress Notes (Signed)
Brief cardiology follow up note:   Patient initiated on HD this admission for volume management. He follows with Dr. Marlou Porch for his heart failure. He is on carvedilol 25 mg BID and not on ACEi/ARB/ARNI/MRA due to ESRD.  [facility] will sign off.   Medication Recommendations:  Continue carvedilol, hydralazine. If blood pressure allows (has been better now that he is on dialysis), consider adding isordil 10 mg TID with hydralazine or can use imdur 30 mg daily. If it cannot be added inpatient, we can also discuss adding at follow up. Other recommendations (labs, testing, etc):  none Follow up as an outpatient:  We will arrange follow up with Dr. Marlou Porch, will schedule outside the Covid isolation window.  Buford Dresser, MD, PhD [facility]  [address] [place], [place] [address] [phone]

## 2019-07-31 NOTE — Progress Notes (Signed)
PROGRESS NOTE                                                                                                                                                                                                             Patient Demographics:    Corey Park, is a 49 y.o. male, DOB - 1970/09/07, XBD:[id number]  Outpatient Primary MD for the patient is Patient, No Pcp Per   Admit date - 07/27/2019   LOS - 4  Chief Complaint  Patient presents with  . Constipation  . Hypertension  . Leg Swelling       Brief Narrative: Patient is a 49 y.o. 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  See below for further details.   Subjective:    Corey Park feels much better-his hiccups have essentially resolved.   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  Volume status much improved following HD.  Volume overload/decompensated heart failure (EF 20-25% per TTE on 07/28/2019) in the setting of progression to ESRD: Volume status much improved following HD.  No longer on diuretics.   Moderate pulmonary hypertension: Likely secondary to left-sided heart failure-stable for outpatient follow-up and monitoring.  Acute Hypoxic Resp Failure due to Covid 19 Viral pneumonia: Remains on room air but concerning that CRP is still increasing-patient is at risk for severe disease-continue steroids and remdesivir.    Fever: afebrile  O2 requirements:  SpO2: 98 % O2 Flow Rate (L/min): 2 L/min   COVID-19 Labs: Recent Labs    07/29/19 0336 07/30/19 0509 07/31/19 0347  DDIMER 0.88* 1.11* 1.04*  FERRITIN 90 236 325  CRP 2.0* 3.7* 5.5*       Component Value Date/Time   BNP 2,665.5 (H)  07/27/2019 1851    No results for input(s): PROCALCITON in the last 168 hours.  Lab Results  Component Value Date   [place] NEGATIVE 12/21/2018     COVID-19 Medications: Steroids:2/3>> Remdesivir:2/3>>  Prone/Incentive Spirometry: encouraged incentive spirometry use 3-4/hour.  DVT Prophylaxis  :   Heparin   HTN: Relatively stable but on the higher side-continue Coreg-add hydralazine-if  needed-we can add nitrates as recommended by cardiology.  Hiccups: Seems to have resolved-likely secondary to uremia.  If reoccurs-we will try Reglan as needed.  Anemia: Secondary to CKD-hemoglobin trending down but no signs of bleeding-being managed by nephrology.  DM-2 (A1C 6.0 on 07/28/19): CBG stable-on SSI-does not appear to be on any oral hypoglycemics as outpatient.  CBG (last 3)  Recent Labs    07/30/19 1652 07/30/19 2133 07/31/19 0804  GLUCAP 103* 180* 124*   Obesity: Estimated body mass index is 29.58 kg/m as calculated from the following:   Height as of this encounter: 6\' 2"  (1.88 m).   Weight as of this encounter: 104.5 kg.   Consults  : Cardiology/nephrology  Procedures  :  2/2: Tunneled HD catheter placement by IR  ABG:    Component Value Date/Time   TCO2 24 01/27/2018 1309    Vent Settings: N/A  Condition -Stable  Family Communication  :  Mother updated over the phone on 2/4  Code Status :  Full Code  Diet :  Diet Order            Diet renal with fluid restriction Fluid restriction: 1200 mL Fluid; Room service appropriate? Yes; Fluid consistency: Thin  Diet effective now               Disposition Plan  :  Remain hospitalized-Home when ready for discharge  Barriers to discharge: Worsening CRP-on IV remdesivir need to complete 5 days-also need to arrange for outpatient HD  Antimicorbials  :    Anti-infectives (From admission, onward)   Start     Dose/Rate Route Frequency Ordered Stop   07/31/19 1000  remdesivir 100 mg in sodium chloride 0.9 %  100 mL IVPB     100 mg 200 mL/hr over 30 Minutes Intravenous Daily 07/30/19 1636 08/04/19 0959   07/30/19 1700  remdesivir 200 mg in sodium chloride 0.9% 250 mL IVPB     200 mg 580 mL/hr over 30 Minutes Intravenous Once 07/30/19 1636 07/30/19 1805   07/29/19 1230  ceFAZolin (ANCEF) IVPB 2g/100 mL premix     2 g 200 mL/hr over 30 Minutes Intravenous  Once 07/29/19 1216 07/29/19 1250   07/29/19 1217  ceFAZolin (ANCEF) 2-4 GM/100ML-% IVPB    Note to Pharmacy: Kandy Garrison   : cabinet override      07/29/19 1217 07/30/19 0029   07/27/19 1915  cefTRIAXone (ROCEPHIN) 1 g in sodium chloride 0.9 % 100 mL IVPB     1 g 200 mL/hr over 30 Minutes Intravenous  Once 07/27/19 1909 07/27/19 2027   07/27/19 1915  azithromycin (ZITHROMAX) 500 mg in sodium chloride 0.9 % 250 mL IVPB  Status:  Discontinued     500 mg 250 mL/hr over 60 Minutes Intravenous  Once 07/27/19 1909 07/27/19 2016      Inpatient Medications  Scheduled Meds: . atorvastatin  20 mg Oral q1800  . carvedilol  25 mg Oral BID WC  . Chlorhexidine Gluconate Cloth  6 each Topical Q0600  . darbepoetin (ARANESP) injection - DIALYSIS  100 mcg Intravenous Q Thu-HD  . dexamethasone  6 mg Oral Daily  . ferrous sulfate  325 mg Oral Daily  . gabapentin  100 mg Oral TID  . heparin      . heparin injection (subcutaneous)  5,000 Units Subcutaneous Q8H  . [START ON 08/03/2019] influenza vac split quadrivalent PF  0.5 mL Intramuscular Once  . insulin aspart  0-6 Units Subcutaneous TID WC  .  magnesium oxide  400 mg Oral BID  . polyethylene glycol  17 g Oral Daily  . senna-docusate  2 tablet Oral BID   Continuous Infusions: . sodium chloride    . sodium chloride    . remdesivir 100 mg in NS 100 mL 100 mg (07/31/19 0926)   PRN Meds:.sodium chloride, sodium chloride, acetaminophen, alteplase, bisacodyl, heparin, hydrALAZINE, lidocaine (PF), lidocaine-prilocaine, pentafluoroprop-tetrafluoroeth   Time Spent in minutes  25  See all Orders  from today for further details   Oren Binet M.D on 07/31/2019 at 10:24 AM  To page go to [email or web address] - use universal password  [facility] -  Office  [phone]    Objective:   Vitals:   07/30/19 2135 07/31/19 0510 07/31/19 0627 07/31/19 0908  BP: 140/86 (!) 154/99  (!) 156/99  Pulse: 77 74  80  Resp: 16 (!) 22    Temp: 98.9 F (37.2 C) 98.1 F (36.7 C)  98 F (36.7 C)  TempSrc: Oral Oral  Oral  SpO2: 97% 98%  98%  Weight:   104.5 kg   Height:        Wt Readings from Last 3 Encounters:  07/31/19 104.5 kg  01/16/19 105.7 kg  12/24/18 106.5 kg     Intake/Output Summary (Last 24 hours) at 07/31/2019 1024 Last data filed at 07/30/2019 2132 Gross per 24 hour  Intake 826.97 ml  Output 1100 ml  Net -273.03 ml     Physical Exam Gen Exam:Alert awake-not in any distress HEENT:atraumatic, normocephalic Chest: B/L clear to auscultation anteriorly CVS:S1S2 regular Abdomen:soft non tender, non distended Extremities:TRACE edema Neurology: Non focal Skin: no rash   Data Review:    CBC Recent Labs  Lab 07/27/19 1851 07/29/19 0336 07/29/19 1316 07/30/19 0509 07/31/19 0347  WBC 4.9 3.3* 2.7* 3.4* 1.5*  HGB 8.9* 8.1* 7.8* 8.0* 7.7*  HCT 27.7* 25.1* 24.1* 24.4* 23.6*  PLT 146* 99* 100* 101* 99*  MCV 92.0 90.6 89.6 88.1 89.7  MCH 29.6 29.2 29.0 28.9 29.3  MCHC 32.1 32.3 32.4 32.8 32.6  RDW 14.1 14.1 14.0 13.8 13.8  LYMPHSABS 0.5* 0.5*  --  0.6* 0.2*  MONOABS 0.3 0.1  --  0.2 0.1  EOSABS 0.0 0.1  --  0.0 0.0  BASOSABS 0.0 0.0  --  0.0 0.0    Chemistries  Recent Labs  Lab 07/27/19 1851 07/27/19 1851 07/28/19 0203 07/29/19 0336 07/29/19 1400 07/30/19 0509 07/31/19 0347  NA 137   < > 138 136 131* 135 135  K 4.2   < > 4.2 4.2 4.0 4.0 3.7  CL 105   < > 107 104 104 105 102  CO2 18*   < > 18* 17* 17* 18* 22  GLUCOSE 164*   < > 101* 99 167* 102* 207*  BUN 68*   < > 68* 71* 71* 58* 38*  CREATININE 9.06*   < > 9.02* 9.22* 9.15* 8.72* 6.54*    CALCIUM 7.5*   < > 7.5* 7.3* 6.6* 6.9* 6.9*  MG  --   --  1.6* 1.4*  --  1.5* 1.6*  AST 23  --  23 21  --  26 29  ALT 18  --  18 16  --  15 12  ALKPHOS 56  --  51 47  --  55 50  BILITOT 0.5  --  0.6 0.6  --  0.6 0.6   < > = values in this interval not displayed.   ------------------------------------------------------------------------------------------------------------------ No results  for input(s): CHOL, HDL, LDLCALC, TRIG, CHOLHDL, LDLDIRECT in the last 72 hours.  Lab Results  Component Value Date   HGBA1C 6.0 (H) 07/28/2019   ------------------------------------------------------------------------------------------------------------------ No results for input(s): TSH, T4TOTAL, T3FREE, THYROIDAB in the last 72 hours.  Invalid input(s): FREET3 ------------------------------------------------------------------------------------------------------------------ Recent Labs    07/30/19 0509 07/31/19 0347  FERRITIN 236 325  TIBC  --  238*  IRON  --  13*    Coagulation profile No results for input(s): INR, PROTIME in the last 168 hours.  Recent Labs    07/30/19 0509 07/31/19 0347  DDIMER 1.11* 1.04*    Cardiac Enzymes No results for input(s): CKMB, TROPONINI, MYOGLOBIN in the last 168 hours.  Invalid input(s): CK ------------------------------------------------------------------------------------------------------------------    Component Value Date/Time   BNP 2,665.5 (H) 07/27/2019 1851    Micro Results Recent Results (from the past 240 hour(s))  Blood culture (routine x 2)     Status: None (Preliminary result)   Collection Time: 07/27/19  7:30 PM   Specimen: BLOOD  Result Value Ref Range Status   Specimen Description BLOOD RIGHT ANTECUBITAL  Final   Special Requests   Final    BOTTLES DRAWN AEROBIC AND ANAEROBIC Blood Culture results may not be optimal due to an inadequate volume of blood received in culture bottles   Culture   Final    NO GROWTH 3  DAYS Performed at [facility], [place] [address] [place], [place] [address]    Report Status PENDING  Incomplete  Blood culture (routine x 2)     Status: None (Preliminary result)   Collection Time: 07/27/19  7:57 PM   Specimen: BLOOD RIGHT HAND  Result Value Ref Range Status   Specimen Description BLOOD RIGHT HAND  Final   Special Requests   Final    BOTTLES DRAWN AEROBIC ONLY Blood Culture results may not be optimal due to an inadequate volume of blood received in culture bottles   Culture   Final    NO GROWTH 3 DAYS Performed at [facility], [place] [address], [place], [place] [address]    Report Status PENDING  Incomplete    Radiology Reports DG Chest 2 View  Result Date: 07/27/2019 CLINICAL DATA:  Leg swelling and constipation.  Fever. EXAM: CHEST - 2 VIEW COMPARISON:  December 21, 2018 FINDINGS: Cardiomegaly. The hila and mediastinum are normal. No pneumothorax. Mild opacity in the right base is somewhat platelike in appearance. No other infiltrates are noted. No overt edema. IMPRESSION: Platelike opacity in the right base may represent atelectasis but infiltrate such as pneumonia is not excluded. Cardiomegaly. Electronically Signed   By: Dorise Bullion III M.D   On: 07/27/2019 18:57   IR Fluoro Guide CV Line Right  Result Date: 07/29/2019 INDICATION: 49 year old with acute on chronic renal failure with uremia. EXAM: FLUOROSCOPIC AND ULTRASOUND GUIDED PLACEMENT OF A TUNNELED DIALYSIS CATHETER Physician: Stephan Minister. Anselm Pancoast, MD MEDICATIONS: Ancef 2 g; The antibiotic was administered within an appropriate time interval prior to skin puncture. ANESTHESIA/SEDATION: Versed 1.5 mg IV; Fentanyl 75 mcg IV; Moderate Sedation Time:  18 minutes The patient was continuously monitored during the procedure by the interventional radiology nurse under my direct supervision. FLUOROSCOPY TIME:  Fluoroscopy Time: 18 seconds, 2 mGy COMPLICATIONS: None immediate. PROCEDURE: The procedure was explained to the  patient. The risks and benefits of the procedure were discussed and the patient's questions were addressed. Informed consent was obtained from the patient. The patient was placed supine on the interventional  table. Ultrasound confirmed a patent right internal jugular vein. Ultrasound images were obtained for documentation. The right neck and chest was prepped and draped in a sterile fashion. The right neck was anesthetized with 1% lidocaine. Maximal barrier sterile technique was utilized including caps, mask, sterile gowns, sterile gloves, sterile drape, hand hygiene and skin antiseptic. A small incision was made with #11 blade scalpel. A 21 gauge needle directed into the right internal jugular vein with ultrasound guidance. A micropuncture dilator set was placed. A 23 cm tip to cuff Palindrome catheter was selected. The skin below the right clavicle was anesthetized and a small incision was made with an #11 blade scalpel. A subcutaneous tunnel was formed to the vein dermatotomy site. The catheter was brought through the tunnel. The vein dermatotomy site was dilated to accommodate a peel-away sheath. The catheter was placed through the peel-away sheath and directed into the central venous structures. The tip of the catheter was placed in the right atrium with fluoroscopy. Fluoroscopic images were obtained for documentation. Both lumens were found to aspirate and flush well. The proper amount of heparin was flushed in both lumens. The vein dermatotomy site was closed using a single layer of absorbable suture and Dermabond. Gel-Foam placed in subcutaneous tract. The catheter was secured to the skin using Prolene suture. IMPRESSION: Successful placement of a right jugular tunneled dialysis catheter using ultrasound and fluoroscopic guidance. Electronically Signed   By: Markus Daft M.D.   On: 07/29/2019 15:20   IR US Guide Vasc Access Right  Result Date: 07/29/2019 INDICATION: 49 year old with acute on chronic renal  failure with uremia. EXAM: FLUOROSCOPIC AND ULTRASOUND GUIDED PLACEMENT OF A TUNNELED DIALYSIS CATHETER Physician: Stephan Minister. Anselm Pancoast, MD MEDICATIONS: Ancef 2 g; The antibiotic was administered within an appropriate time interval prior to skin puncture. ANESTHESIA/SEDATION: Versed 1.5 mg IV; Fentanyl 75 mcg IV; Moderate Sedation Time:  18 minutes The patient was continuously monitored during the procedure by the interventional radiology nurse under my direct supervision. FLUOROSCOPY TIME:  Fluoroscopy Time: 18 seconds, 2 mGy COMPLICATIONS: None immediate. PROCEDURE: The procedure was explained to the patient. The risks and benefits of the procedure were discussed and the patient's questions were addressed. Informed consent was obtained from the patient. The patient was placed supine on the interventional table. Ultrasound confirmed a patent right internal jugular vein. Ultrasound images were obtained for documentation. The right neck and chest was prepped and draped in a sterile fashion. The right neck was anesthetized with 1% lidocaine. Maximal barrier sterile technique was utilized including caps, mask, sterile gowns, sterile gloves, sterile drape, hand hygiene and skin antiseptic. A small incision was made with #11 blade scalpel. A 21 gauge needle directed into the right internal jugular vein with ultrasound guidance. A micropuncture dilator set was placed. A 23 cm tip to cuff Palindrome catheter was selected. The skin below the right clavicle was anesthetized and a small incision was made with an #11 blade scalpel. A subcutaneous tunnel was formed to the vein dermatotomy site. The catheter was brought through the tunnel. The vein dermatotomy site was dilated to accommodate a peel-away sheath. The catheter was placed through the peel-away sheath and directed into the central venous structures. The tip of the catheter was placed in the right atrium with fluoroscopy. Fluoroscopic images were obtained for documentation.  Both lumens were found to aspirate and flush well. The proper amount of heparin was flushed in both lumens. The vein dermatotomy site was closed using a single layer of absorbable  suture and Dermabond. Gel-Foam placed in subcutaneous tract. The catheter was secured to the skin using Prolene suture. IMPRESSION: Successful placement of a right jugular tunneled dialysis catheter using ultrasound and fluoroscopic guidance. Electronically Signed   By: Markus Daft M.D.   On: 07/29/2019 15:20   ECHOCARDIOGRAM LIMITED  Result Date: 07/28/2019   ECHOCARDIOGRAM LIMITED REPORT   Patient Name:   Corey Park Date of Exam: 07/28/2019 Medical Rec #:  [id number]       Height:       74.0 in Accession #:    [id number]      Weight:       240.3 lb Date of Birth:  Jun 28, 1970       BSA:          2.35 m Patient Age:    15 years        BP:           120/86 mmHg Patient Gender: M               HR:           76 bpm. Exam Location:  Inpatient  Procedure: Limited Echo, Cardiac Doppler and Color Doppler Indications:    CHF  History:        Patient has prior history of Echocardiogram examinations, most                 recent 06/17/2018. Covid positive, CKD.  Sonographer:    Dustin Flock Referring Phys: [id number] [place]  1. Left ventricular ejection fraction, by visual estimation, is 20 to 25%. The left ventricle has severely decreased function. There is severely increased left ventricular wall thickness.  2. Elevated left atrial pressure.  3. Left ventricular diastolic parameters are consistent with Grade II diastolic dysfunction (pseudonormalization).  4. Mildly dilated left ventricular internal cavity size.  5. The left ventricle demonstrates global hypokinesis.  6. Global right ventricle has severely reduced systolic function.The right ventricular size is normal.  7. Trivial pericardial effusion is present.  8. The mitral valve is normal in structure. Trivial mitral valve regurgitation. No evidence of mitral stenosis.   9. The tricuspid valve was normal in structure. Tricuspid valve regurgitation is mild. 10. Mild aortic valve sclerosis without stenosis. 11. The pulmonic valve was normal in structure. Pulmonic valve regurgitation is mild by color flow Doppler. 12. Aortic dilatation noted. 13. There is mild dilatation of the aortic root measuring 39 mm. 14. Moderately elevated pulmonary artery systolic pressure. 15. The inferior vena cava is dilated in size with <50% respiratory variability, suggesting right atrial pressure of 15 mmHg. 16. Severe global reduction in LV systolic function; severe LVH; mild LVE; severe RV dysfunction; mild TR; moderate pulmonary hypertension. FINDINGS  Left Ventricle: Left ventricular ejection fraction, by visual estimation, is 20 to 25%. The left ventricle has severely decreased function. The left ventricle demonstrates global hypokinesis. The left ventricular internal cavity size was mildly dilated left ventricle. There is severely increased left ventricular wall thickness. Left ventricular diastolic parameters are consistent with Grade II diastolic dysfunction (pseudonormalization). Elevated left atrial pressure. Right Ventricle: The right ventricular size is normal.Global RV systolic function is has severely reduced systolic function. The tricuspid regurgitant velocity is 2.94 m/s, and with an assumed right atrial pressure of 8 mmHg, the estimated right ventricular systolic pressure is moderately elevated at 42.5 mmHg. Left Atrium: Left atrial size was normal in size. Right Atrium: Right atrial size was normal in size. Right atrial pressure is  estimated at 8 mmHg. Pericardium: Trivial pericardial effusion is present is seen. Trivial pericardial effusion is present. Mitral Valve: The mitral valve is normal in structure. There is mild thickening of the mitral valve leaflet(s). No evidence of mitral valve stenosis by observation. MV Area by PHT, 4.49 cm. MV PHT, 49.01 msec. Trivial mitral valve  regurgitation. Tricuspid Valve: The tricuspid valve is normal in structure. Tricuspid valve regurgitation is mild. Aortic Valve: The aortic valve is tricuspid. Aortic valve regurgitation is not visualized. Mild aortic valve sclerosis is present, with no evidence of aortic valve stenosis. Pulmonic Valve: The pulmonic valve was normal in structure. Pulmonic valve regurgitation is mild by color flow Doppler. Pulmonic regurgitation is mild by color flow Doppler. Aorta: Aortic dilatation noted. There is mild dilatation of the aortic root measuring 39 mm. Venous: The inferior vena cava is dilated in size with less than 50% respiratory variability, suggesting right atrial pressure of 15 mmHg. Shunts: There is no evidence of a patent foramen ovale.  Additional Comments: Severe global reduction in LV systolic function; severe LVH; mild LVE; severe RV dysfunction; mild TR; moderate pulmonary hypertension.  LEFT VENTRICLE          Normals PLAX 2D LVIDd:         5.92 cm  3.6 cm   Diastology                 Normals LVIDs:         4.48 cm  1.7 cm   LV e' lateral:   4.70 cm/s 6.42 cm/s LV PW:         1.53 cm  1.4 cm   LV E/e' lateral: 12.4      15.4 LV IVS:        1.72 cm  1.3 cm   LV e' medial:    3.24 cm/s 6.96 cm/s LVOT diam:     2.20 cm  2.0 cm   LV E/e' medial:  18.0      6.96 LV SV:         83 ml    79 ml LV SV Index:   34.46    45 ml/m2 LVOT Area:     3.80 cm 3.14 cm2  LEFT ATRIUM         Index LA diam:    3.90 cm 1.66 cm/m  AORTIC VALVE             Normals LVOT Vmax:   79.60 cm/s LVOT Vmean:  48.000 cm/s 75 cm/s LVOT VTI:    0.130 m     25.3 cm  AORTA                 Normals Ao Root diam: 3.90 cm 31 mm MITRAL VALVE              Normals   TRICUSPID VALVE             Normals MV Area (PHT): 4.49 cm             TR Peak grad:   34.5 mmHg MV PHT:        49.01 msec 55 ms     TR Vmax:        330.00 cm/s 288 cm/s MV Decel Time: 169 msec   187 ms MV E velocity: 58.30 cm/s 103 cm/s  SHUNTS MV A velocity: 38.10 cm/s 70.3 cm/s  Systemic VTI:  0.13 m MV E/A ratio:  1.53  1.5       Systemic Diam: 2.20 cm  Kirk Ruths MD Electronically signed by Kirk Ruths MD Signature Date/Time: 07/28/2019/2:52:57 PMThe mitral valve is normal in structure.    Final

## 2019-07-31 NOTE — Progress Notes (Signed)
  [facility] Progress Note   Assessment/ Plan:   1.  AKI on CKD V with uremia--> ESRD: Started HD.  s/p TDC 07/29/19, HD #1 2/2, HD #2 2/3, HD #3 2/4.  CLIP will start (have d/w renal navigator).  Perm access will need to be when pt off COVID precautions.  Next planned rx 08/02/19  2.  COVID: per primary  3.  HTN: reasonably well-controlled  4.  Anemia: ESA, starting darbepoetin 100 q Thursday and getting iron panel  5.  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  Dispo: pending  Subjective:    HD #3 today.  Very adamant that he wants 3rd shift HD--> discussed with renal navigator (see notes).     Objective:   BP (!) 182/107 (BP Location: Left Arm)   Pulse 75   Temp 98.4 F (36.9 C) (Oral)   Resp 20   Ht 6\' 2"  (1.88 m)   Wt 108.3 kg   SpO2 98%   BMI 30.65 kg/m   Physical Exam: NAD observed through window of COVID HD unit On HD  Labs: BMET Recent Labs  Lab 07/27/19 1851 07/28/19 0203 07/29/19 0336 07/29/19 1400 07/30/19 0509 07/31/19 0347  NA 137 138 136 131* 135 135  K 4.2 4.2 4.2 4.0 4.0 3.7  CL 105 107 104 104 105 102  CO2 18* 18* 17* 17* 18* 22  GLUCOSE 164* 101* 99 167* 102* 207*  BUN 68* 68* 71* 71* 58* 38*  CREATININE 9.06* 9.02* 9.22* 9.15* 8.72* 6.54*  CALCIUM 7.5* 7.5* 7.3* 6.6* 6.9* 6.9*  PHOS  --   --   --  4.6  --   --    CBC Recent Labs  Lab 07/27/19 1851 07/27/19 1851 07/29/19 0336 07/29/19 1316 07/30/19 0509 07/31/19 0347  WBC 4.9   < > 3.3* 2.7* 3.4* 1.5*  NEUTROABS 4.1  --  2.6  --  2.7 1.2*  HGB 8.9*   < > 8.1* 7.8* 8.0* 7.7*  HCT 27.7*   < > 25.1* 24.1* 24.4* 23.6*  MCV 92.0   < > 90.6 89.6 88.1 89.7  PLT 146*   < > 99* 100* 101* 99*   < > = values in this interval not displayed.      Medications:    . atorvastatin  20 mg Oral q1800  . carvedilol  25 mg Oral BID WC  . Chlorhexidine Gluconate Cloth  6 each Topical Q0600  . darbepoetin (ARANESP) injection - DIALYSIS  100 mcg Intravenous Q Thu-HD  . dexamethasone   6 mg Oral Daily  . ferrous sulfate  325 mg Oral Daily  . gabapentin  100 mg Oral TID  . heparin      . heparin injection (subcutaneous)  5,000 Units Subcutaneous Q8H  . hydrALAZINE  25 mg Oral Q8H  . [START ON 08/03/2019] influenza vac split quadrivalent PF  0.5 mL Intramuscular Once  . insulin aspart  0-6 Units Subcutaneous TID WC  . magnesium oxide  400 mg Oral BID  . polyethylene glycol  17 g Oral Daily  . senna-docusate  2 tablet Oral BID     Madelon Lips MD 07/31/2019, 5:45 PM

## 2019-08-01 LAB — COMPREHENSIVE METABOLIC PANEL
ALT: 14 U/L (ref 0–44)
AST: 34 U/L (ref 15–41)
Albumin: 1.9 g/dL — ABNORMAL LOW (ref 3.5–5.0)
Alkaline Phosphatase: 51 U/L (ref 38–126)
Anion gap: 10 (ref 5–15)
BUN: 29 mg/dL — ABNORMAL HIGH (ref 6–20)
CO2: 25 mmol/L (ref 22–32)
Calcium: 7 mg/dL — ABNORMAL LOW (ref 8.9–10.3)
Chloride: 103 mmol/L (ref 98–111)
Creatinine, Ser: 4.73 mg/dL — ABNORMAL HIGH (ref 0.61–1.24)
GFR calc Af Amer: 16 mL/min — ABNORMAL LOW (ref >60)
GFR calc non Af Amer: 14 mL/min — ABNORMAL LOW (ref >60)
Glucose, Bld: 157 mg/dL — ABNORMAL HIGH (ref 70–99)
Potassium: 3.7 mmol/L (ref 3.5–5.1)
Sodium: 138 mmol/L (ref 135–145)
Total Bilirubin: 0.3 mg/dL (ref 0.3–1.2)
Total Protein: 5 g/dL — ABNORMAL LOW (ref 6.5–8.1)

## 2019-08-01 LAB — CULTURE, BLOOD (ROUTINE X 2)
Culture: NO GROWTH
Culture: NO GROWTH

## 2019-08-01 LAB — CBC WITH DIFFERENTIAL/PLATELET
Abs Immature Granulocytes: 0.02 10*3/uL (ref 0.00–0.07)
Basophils Absolute: 0 10*3/uL (ref 0.0–0.1)
Basophils Relative: 0 %
Eosinophils Absolute: 0 10*3/uL (ref 0.0–0.5)
Eosinophils Relative: 0 %
HCT: 24.6 % — ABNORMAL LOW (ref 39.0–52.0)
Hemoglobin: 8.2 g/dL — ABNORMAL LOW (ref 13.0–17.0)
Immature Granulocytes: 0 %
Lymphocytes Relative: 7 %
Lymphs Abs: 0.4 10*3/uL — ABNORMAL LOW (ref 0.7–4.0)
MCH: 29.1 pg (ref 26.0–34.0)
MCHC: 33.3 g/dL (ref 30.0–36.0)
MCV: 87.2 fL (ref 80.0–100.0)
Monocytes Absolute: 0.3 10*3/uL (ref 0.1–1.0)
Monocytes Relative: 5 %
Neutro Abs: 4.9 10*3/uL (ref 1.7–7.7)
Neutrophils Relative %: 88 %
Platelets: 129 10*3/uL — ABNORMAL LOW (ref 150–400)
RBC: 2.82 MIL/uL — ABNORMAL LOW (ref 4.22–5.81)
RDW: 13.8 % (ref 11.5–15.5)
WBC: 5.5 10*3/uL (ref 4.0–10.5)
nRBC: 0 % (ref 0.0–0.2)

## 2019-08-01 LAB — D-DIMER, QUANTITATIVE: D-Dimer, Quant: 1.38 ug/mL-FEU — ABNORMAL HIGH (ref 0.00–0.50)

## 2019-08-01 LAB — FERRITIN: Ferritin: 420 ng/mL — ABNORMAL HIGH (ref 24–336)

## 2019-08-01 LAB — MAGNESIUM: Magnesium: 1.5 mg/dL — ABNORMAL LOW (ref 1.7–2.4)

## 2019-08-01 LAB — GLUCOSE, CAPILLARY
Glucose-Capillary: 140 mg/dL — ABNORMAL HIGH (ref 70–99)
Glucose-Capillary: 188 mg/dL — ABNORMAL HIGH (ref 70–99)
Glucose-Capillary: 281 mg/dL — ABNORMAL HIGH (ref 70–99)
Glucose-Capillary: 248 mg/dL — ABNORMAL HIGH (ref 70–99)

## 2019-08-01 LAB — C-REACTIVE PROTEIN: CRP: 4.6 mg/dL — ABNORMAL HIGH (ref <1.0)

## 2019-08-01 LAB — PHOSPHORUS: Phosphorus: 5 mg/dL — ABNORMAL HIGH (ref 2.5–4.6)

## 2019-08-01 MED ORDER — DARBEPOETIN ALFA 100 MCG/0.5ML IJ SOSY
100.0000 ug | PREFILLED_SYRINGE | INTRAMUSCULAR | Status: DC
  Filled 2019-08-01: qty 0.5

## 2019-08-01 MED ORDER — SODIUM CHLORIDE 0.9 % IV SOLN
125.0000 mg | INTRAVENOUS | Status: DC
  Administered 2019-08-02: 14:00:00 125 mg via INTRAVENOUS
  Filled 2019-08-01: qty 10

## 2019-08-01 MED ORDER — CHLORHEXIDINE GLUCONATE CLOTH 2 % EX PADS
6.0000 | MEDICATED_PAD | Freq: Every day | CUTANEOUS | Status: DC
  Administered 2019-08-02 – 2019-08-03 (×2): 6 via TOPICAL

## 2019-08-01 MED ORDER — METOCLOPRAMIDE HCL 5 MG/ML IJ SOLN
5.0000 mg | Freq: Three times a day (TID) | INTRAMUSCULAR | Status: DC | PRN
  Administered 2019-08-01: 5 mg via INTRAVENOUS
  Filled 2019-08-01: qty 2

## 2019-08-01 NOTE — Progress Notes (Signed)
[facility] NEPHROLOGY PROGRESS NOTE  Assessment/ Plan: Pt is a 49 y.o. yo male with CKD 5, Covid pneumonia, worsening renal failure to ESRD.  Now on HD.  #AKI on CKD 5 with uremia: Progressed to ESRD.  Status post [facility] placement on 07/29/2019 and is started HD.  Status post HD yesterday with 1500 cc UF.  Plan for next HD tomorrow.  Arrangement for outpatient HD ongoing.  Permanent access when he is off of Covid precaution.  # Anemia of CKD: Continue ESA.  Iron saturation 5%.  Start IV iron during HD.  Monitor hemoglobin.  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  Not on binders now.  # HTN/volume monitor blood pressure.  Volume status acceptable.  #Acute hypoxic respiratory failure due to COVID-19 pneumonia: Clinically improved.  Per primary team.  Subjective: Seen and examined at bedside.  Reported feeling much better.  Denies nausea vomiting chest pain shortness of breath. Objective Vital signs in last 24 hours: Vitals:   07/31/19 2100 08/01/19 0631 08/01/19 0639 08/01/19 1421  BP: (!) 155/90 (!) 152/85  130/79  Pulse: 76 75  69  Resp: 16 18  (!) 23  Temp: 98 F (36.7 C) 98 F (36.7 C)  98 F (36.7 C)  TempSrc: Oral Axillary  Oral  SpO2: 99% 98%  97%  Weight:   101.1 kg   Height:       Weight change: -2.8 kg  Intake/Output Summary (Last 24 hours) at 08/01/2019 1545 Last data filed at 08/01/2019 1330 Gross per 24 hour  Intake 540 ml  Output 1500 ml  Net -960 ml       Labs: Basic Metabolic Panel: Recent Labs  Lab 07/29/19 1400 07/29/19 1400 07/30/19 0509 07/31/19 0347 08/01/19 0500  NA 131*   < > 135 135 138  K 4.0   < > 4.0 3.7 3.7  CL 104   < > 105 102 103  CO2 17*   < > 18* 22 25  GLUCOSE 167*   < > 102* 207* 157*  BUN 71*   < > 58* 38* 29*  CREATININE 9.15*   < > 8.72* 6.54* 4.73*  CALCIUM 6.6*   < > 6.9* 6.9* 7.0*  PHOS 4.6  --   --   --  5.0*   < > = values in this interval not displayed.   Liver Function Tests: Recent Labs  Lab  07/30/19 0509 07/31/19 0347 08/01/19 0500  AST 26 29 34  ALT 15 12 14   ALKPHOS 55 50 51  BILITOT 0.6 0.6 0.3  PROT 5.7* 5.3* 5.0*  ALBUMIN 2.1* 1.9* 1.9*   Recent Labs  Lab 07/27/19 1930  LIPASE 63*   No results for input(s): AMMONIA in the last 168 hours. CBC: Recent Labs  Lab 07/29/19 0336 07/29/19 0336 07/29/19 1316 07/29/19 1316 07/30/19 0509 07/31/19 0347 08/01/19 0500  WBC 3.3*   < > 2.7*   < > 3.4* 1.5* 5.5  NEUTROABS 2.6   < >  --   --  2.7 1.2* 4.9  HGB 8.1*   < > 7.8*   < > 8.0* 7.7* 8.2*  HCT 25.1*   < > 24.1*   < > 24.4* 23.6* 24.6*  MCV 90.6  --  89.6  --  88.1 89.7 87.2  PLT 99*   < > 100*   < > 101* 99* 129*   < > = values in this interval not displayed.   Cardiac Enzymes: No results for input(s): CKTOTAL,  CKMB, CKMBINDEX, TROPONINI in the last 168 hours. CBG: Recent Labs  Lab 07/31/19 1230 07/31/19 1835 07/31/19 2057 08/01/19 0742 08/01/19 1150  GLUCAP 205* 108* 153* 140* 188*    Iron Studies:  Recent Labs    07/31/19 0347 07/31/19 0347 08/01/19 0500  IRON 13*  --   --   TIBC 238*  --   --   FERRITIN 325   < > 420*   < > = values in this interval not displayed.   Studies/Results: No results found.  Medications: Infusions: . sodium chloride    . sodium chloride    . remdesivir 100 mg in NS 100 mL 100 mg (08/01/19 0834)    Scheduled Medications: . atorvastatin  20 mg Oral q1800  . carvedilol  25 mg Oral BID WC  . Chlorhexidine Gluconate Cloth  6 each Topical Q0600  . [START ON 08/02/2019] darbepoetin (ARANESP) injection - DIALYSIS  100 mcg Intravenous Q Sat-HD  . dexamethasone  6 mg Oral Daily  . ferrous sulfate  325 mg Oral Daily  . heparin injection (subcutaneous)  5,000 Units Subcutaneous Q8H  . hydrALAZINE  25 mg Oral Q8H  . [START ON 08/03/2019] influenza vac split quadrivalent PF  0.5 mL Intramuscular Once  . insulin aspart  0-6 Units Subcutaneous TID WC  . magnesium oxide  400 mg Oral BID  . polyethylene glycol  17 g  Oral Daily  . senna-docusate  2 tablet Oral BID    have reviewed scheduled and prn medications.  Physical Exam: General:NAD, comfortable Heart:RRR, s1s2 nl Lungs:clear b/l, no crackle Abdomen:soft, Non-tender, non-distended Extremities:No edema Dialysis Access: Right IJ TDC.  Delcie Ruppert Tanna Furry 08/01/2019,3:45 PM  LOS: 5 days  Pager: [phone]

## 2019-08-01 NOTE — Progress Notes (Signed)
Renal Navigator spoke with patient's mother who is understanding of the importance of dialysis treatments, as she states her mother was on HD. She wrote down patient's treatment schedule and clinic information, though she does not live with patient. She cannot assist with transportation, as she states she is elderly and has a heart condition. She reports her doctor has cautioned her to be very careful regarding the COVID pandemic and does not plan to be around her son until he is cleared from the virus completely. She states she and her husband help with transportation at times otherwise. Navigator stated understanding. Per Attending/Dr. Sloan Leiter, tentative plan for patient is discharge Sunday 2/7 after last dose of Remdesivir. Dr. Carolin Sicks states clearance for discharge from Renal perspective at that time. Patient is cleared for discharge from OP HD stand point on Sunday and will, therefore, start in the OP HD clinic, [facility] on Tuesday, 08/05/19 at 12:00pm. Renal Navigator has arranged for [employer] transportation given patient's COVID positive status and no other form of transportation. Patient cannot drive due to blindness and cannot use [facility] while COVID positive. Patient has been accepted at [facility] 3rd shift clinic and should be given a seat schedule in the near future. Both clinic managers [facility] and [facility]) aware.  Renal Navigator called patient to discuss this tentative plan and he is agreeable, understanding and very appreciative.  Alphonzo Cruise, [place] Renal Navigator [phone]

## 2019-08-01 NOTE — Progress Notes (Signed)
Physical Therapy Treatment Patient Details Name: Corey Park MRN: [id number] DOB: [birth year] Today's Date: 08/01/2019    History of Present Illness 49 y.o. male with medical history significant of Hx of blindness, systolic CHF with EF of 96-75% (echo on 05/2018), HTN, Type 2 diabetes, CKD stage 5 not on HD, anemia of CKD, hyperlipidemia who presents with concerns of fatigue and "fluid build up." Aprox 1/27 pt began to have symptoms of fatigue, hiccups and decreased p.o. intake. Pt also stopped taking medication including Lasix. Pt admitted 1/31 for COVID, Acute systolic CHF exacerbation and elevated troponin.     PT Comments    Pt in bed on entry, eager to get up and walk with therapy. Pt is making good progress towards his goals, however continues to have generalized weakness and mild instability likely from decreased activity. Pt is supervision for bed mobility and transfers and min Ax2 for ambulation, initially for steadying and progressing to assist mainly for navigation. D/c plans remain appropriate. PT will continue to follow acutely.     Follow Up Recommendations  Home health PT     Equipment Recommendations  None recommended by PT       Precautions / Restrictions Precautions Precautions: None Restrictions Weight Bearing Restrictions: No    Mobility  Bed Mobility Overal bed mobility: Needs Assistance Bed Mobility: Supine to Sit     Supine to sit: Supervision     General bed mobility comments: supervision for safety   Transfers Overall transfer level: Needs assistance Equipment used: None Transfers: Sit to/from Stand Sit to Stand: Supervision         General transfer comment: supervision for safety, utilizes back of his calves on bed to steady himself in standing  Ambulation/Gait Ambulation/Gait assistance: Min assist;+2 safety/equipment Gait Distance (Feet): 600 Feet Assistive device: 1 person hand held assist Gait Pattern/deviations: Step-through  pattern;Drifts right/left Gait velocity: slowed Gait velocity interpretation: 1.31 - 2.62 ft/sec, indicative of limited community ambulator General Gait Details: minAx2 for steadying, max verbal cuing for directions due to visual impairments          Balance Overall balance assessment: Mild deficits observed, not formally tested                                          Cognition Arousal/Alertness: Awake/alert Behavior During Therapy: WFL for tasks assessed/performed Overall Cognitive Status: Within Functional Limits for tasks assessed                                           General Comments General comments (skin integrity, edema, etc.): VSS on RA      Pertinent Vitals/Pain Pain Assessment: No/denies pain           PT Goals (current goals can now be found in the care plan section) Acute Rehab PT Goals Patient Stated Goal: start an exercise program  PT Goal Formulation: With patient Time For Goal Achievement: 08/13/19 Potential to Achieve Goals: Good Progress towards PT goals: Progressing toward goals    Frequency    Min 3X/week      PT Plan Current plan remains appropriate       AM-PAC PT "6 Clicks" Mobility   Outcome Measure  Help needed turning from your back to your side while in a flat bed  without using bedrails?: None Help needed moving from lying on your back to sitting on the side of a flat bed without using bedrails?: A Little Help needed moving to and from a bed to a chair (including a wheelchair)?: None Help needed standing up from a chair using your arms (e.g., wheelchair or bedside chair)?: None Help needed to walk in hospital room?: A Little Help needed climbing 3-5 steps with a railing? : A Little 6 Click Score: 21    End of Session   Activity Tolerance: Patient tolerated treatment well Patient left: in chair;with call bell/phone within reach Nurse Communication: Mobility status PT Visit Diagnosis:  Unsteadiness on feet (R26.81);Other abnormalities of gait and mobility (R26.89);Muscle weakness (generalized) (M62.81)     Time: 2956-2130 PT Time Calculation (min) (ACUTE ONLY): 20 min  Charges:  $Gait Training: 8-22 mins                     Sean Malinowski B. Migdalia Dk PT, DPT Acute Rehabilitation Services Pager [phone] Office [phone]    [place] 08/01/2019, 12:44 PM

## 2019-08-01 NOTE — Progress Notes (Signed)
PROGRESS NOTE                                                                                                                                                                                                             Patient Demographics:    Corey Park, is a 49 y.o. male, DOB - Oct 24, 1970, EXN:[id number]  Outpatient Primary MD for the patient is Patient, No Pcp Per   Admit date - 07/27/2019   LOS - 5  Chief Complaint  Patient presents with  . Constipation  . Hypertension  . Leg Swelling       Brief Narrative: Patient is a 49 y.o. 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  See below for further details.   Subjective:    Verdie Barrows continues to improve-still has some cough-hiccups have improved as well.   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  Volume status much improved following HD.  Volume overload/decompensated heart failure (EF 20-25% per TTE on 07/28/2019) in the setting of progression to ESRD: Volume status much improved following HD.  No longer on diuretics.   Moderate pulmonary hypertension: Likely secondary to left-sided heart failure-stable for outpatient follow-up and monitoring.  Acute Hypoxic Resp Failure due to Covid 19 Viral pneumonia: Not hypoxic-remains on remdesivir/steroids-thankfully CRP is now plateauing.  Continue close monitoring/remdesivir/steroids as patient is at risk for severe disease given his underlying medical comorbidities.   Fever: afebrile  O2 requirements:  SpO2: 98 % O2 Flow Rate (L/min): 2 L/min   COVID-19 Labs: Recent Labs    07/30/19 0509 07/31/19 0347 08/01/19 0500  DDIMER 1.11* 1.04* 1.38*  FERRITIN  236 325 420*  CRP 3.7* 5.5* 4.6*       Component Value Date/Time   BNP 2,665.5 (H) 07/27/2019 1851    No results for input(s): PROCALCITON in the last 168 hours.  Lab Results  Component Value Date   [place] NEGATIVE 12/21/2018     COVID-19 Medications: Steroids:2/3>> Remdesivir:2/3>>  Prone/Incentive Spirometry: encouraged incentive spirometry use 3-4/hour.  DVT Prophylaxis  :   Heparin   HTN: Better controlled-continue  Coreg and hydralazine-if BP starts increasing-we will add nitrates.    Hiccups: Seems to have resolved-likely secondary to uremia.  On as needed Reglan.  Anemia: Secondary to CKD-globin stable-iron and Aranesp-being managed by nephrology.  DM-2 (A1C 6.0 on 07/28/19): CBG stable-on SSI-does not appear to be on any oral hypoglycemics as outpatient.  CBG (last 3)  Recent Labs    07/31/19 1835 07/31/19 2057 08/01/19 0742  GLUCAP 108* 153* 140*   Obesity: Estimated body mass index is 28.62 kg/m as calculated from the following:   Height as of this encounter: 6\' 2"  (1.88 m).   Weight as of this encounter: 101.1 kg.   Consults  : Cardiology/nephrology  Procedures  :  2/2: Tunneled HD catheter placement by IR  ABG:    Component Value Date/Time   TCO2 24 01/27/2018 1309    Vent Settings: N/A  Condition -Stable  Family Communication  :  Mother updated over the phone on 2/4-we will update on 2/6-medical issues remained stable.  Code Status :  Full Code  Diet :  Diet Order            Diet renal with fluid restriction Fluid restriction: 1200 mL Fluid; Room service appropriate? Yes; Fluid consistency: Thin  Diet effective now               Disposition Plan  :  Remain hospitalized-Home when ready for discharge  Barriers to discharge: Elevated CRP-risk factors for severe COVID-19 pneumonia-remains on IV remdesivir need to complete 5 days-also need to arrange for outpatient HD  Antimicorbials  :    Anti-infectives (From admission,  onward)   Start     Dose/Rate Route Frequency Ordered Stop   07/31/19 1000  remdesivir 100 mg in sodium chloride 0.9 % 100 mL IVPB     100 mg 200 mL/hr over 30 Minutes Intravenous Daily 07/30/19 1636 08/04/19 0959   07/30/19 1700  remdesivir 200 mg in sodium chloride 0.9% 250 mL IVPB     200 mg 580 mL/hr over 30 Minutes Intravenous Once 07/30/19 1636 07/30/19 1805   07/29/19 1230  ceFAZolin (ANCEF) IVPB 2g/100 mL premix     2 g 200 mL/hr over 30 Minutes Intravenous  Once 07/29/19 1216 07/29/19 1250   07/29/19 1217  ceFAZolin (ANCEF) 2-4 GM/100ML-% IVPB    Note to Pharmacy: Kandy Garrison   : cabinet override      07/29/19 1217 07/30/19 0029   07/27/19 1915  cefTRIAXone (ROCEPHIN) 1 g in sodium chloride 0.9 % 100 mL IVPB     1 g 200 mL/hr over 30 Minutes Intravenous  Once 07/27/19 1909 07/27/19 2027   07/27/19 1915  azithromycin (ZITHROMAX) 500 mg in sodium chloride 0.9 % 250 mL IVPB  Status:  Discontinued     500 mg 250 mL/hr over 60 Minutes Intravenous  Once 07/27/19 1909 07/27/19 2016      Inpatient Medications  Scheduled Meds: . atorvastatin  20 mg Oral q1800  . carvedilol  25 mg Oral BID WC  . Chlorhexidine Gluconate Cloth  6 each Topical Q0600  . darbepoetin (ARANESP) injection - DIALYSIS  100 mcg Intravenous Q Thu-HD  . dexamethasone  6 mg Oral Daily  . ferrous sulfate  325 mg Oral Daily  . heparin injection (subcutaneous)  5,000 Units Subcutaneous Q8H  . hydrALAZINE  25 mg Oral Q8H  . [START ON 08/03/2019] influenza vac split quadrivalent PF  0.5 mL Intramuscular Once  . insulin aspart  0-6 Units Subcutaneous TID WC  .  magnesium oxide  400 mg Oral BID  . polyethylene glycol  17 g Oral Daily  . senna-docusate  2 tablet Oral BID   Continuous Infusions: . sodium chloride    . sodium chloride    . remdesivir 100 mg in NS 100 mL 100 mg (08/01/19 0834)   PRN Meds:.sodium chloride, sodium chloride, acetaminophen, alteplase, bisacodyl, heparin, lidocaine (PF),  lidocaine-prilocaine, metoCLOPramide (REGLAN) injection, pentafluoroprop-tetrafluoroeth   Time Spent in minutes  25  See all Orders from today for further details   Oren Binet M.D on 08/01/2019 at 11:32 AM  To page go to [email or web address] - use universal password  [facility] -  Office  [phone]    Objective:   Vitals:   07/31/19 1837 07/31/19 2100 08/01/19 0631 08/01/19 0639  BP: (!) 160/99 (!) 155/90 (!) 152/85   Pulse:  76 75   Resp: (!) 22 16 18    Temp:  98 F (36.7 C) 98 F (36.7 C)   TempSrc:  Oral Axillary   SpO2: 99% 99% 98%   Weight:    101.1 kg  Height:        Wt Readings from Last 3 Encounters:  08/01/19 101.1 kg  01/16/19 105.7 kg  12/24/18 106.5 kg     Intake/Output Summary (Last 24 hours) at 08/01/2019 1132 Last data filed at 08/01/2019 0834 Gross per 24 hour  Intake 340 ml  Output 1500 ml  Net -1160 ml     Physical Exam Gen Exam:Alert awake-not in any distress HEENT:atraumatic, normocephalic Chest: B/L clear to auscultation anteriorly CVS:S1S2 regular Abdomen:soft non tender, non distended Extremities:no edema Neurology: Non focal Skin: no rash   Data Review:    CBC Recent Labs  Lab 07/27/19 1851 07/27/19 1851 07/29/19 0336 07/29/19 1316 07/30/19 0509 07/31/19 0347 08/01/19 0500  WBC 4.9   < > 3.3* 2.7* 3.4* 1.5* 5.5  HGB 8.9*   < > 8.1* 7.8* 8.0* 7.7* 8.2*  HCT 27.7*   < > 25.1* 24.1* 24.4* 23.6* 24.6*  PLT 146*   < > 99* 100* 101* 99* 129*  MCV 92.0   < > 90.6 89.6 88.1 89.7 87.2  MCH 29.6   < > 29.2 29.0 28.9 29.3 29.1  MCHC 32.1   < > 32.3 32.4 32.8 32.6 33.3  RDW 14.1   < > 14.1 14.0 13.8 13.8 13.8  LYMPHSABS 0.5*  --  0.5*  --  0.6* 0.2* 0.4*  MONOABS 0.3  --  0.1  --  0.2 0.1 0.3  EOSABS 0.0  --  0.1  --  0.0 0.0 0.0  BASOSABS 0.0  --  0.0  --  0.0 0.0 0.0   < > = values in this interval not displayed.    Chemistries  Recent Labs  Lab 07/28/19 0203 07/28/19 0203 07/29/19 0336 07/29/19 1400  07/30/19 0509 07/31/19 0347 08/01/19 0500  NA 138   < > 136 131* 135 135 138  K 4.2   < > 4.2 4.0 4.0 3.7 3.7  CL 107   < > 104 104 105 102 103  CO2 18*   < > 17* 17* 18* 22 25  GLUCOSE 101*   < > 99 167* 102* 207* 157*  BUN 68*   < > 71* 71* 58* 38* 29*  CREATININE 9.02*   < > 9.22* 9.15* 8.72* 6.54* 4.73*  CALCIUM 7.5*   < > 7.3* 6.6* 6.9* 6.9* 7.0*  MG 1.6*  --  1.4*  --  1.5* 1.6* 1.5*  AST 23  --  21  --  26 29 34  ALT 18  --  16  --  15 12 14   ALKPHOS 51  --  47  --  55 50 51  BILITOT 0.6  --  0.6  --  0.6 0.6 0.3   < > = values in this interval not displayed.   ------------------------------------------------------------------------------------------------------------------ No results for input(s): CHOL, HDL, LDLCALC, TRIG, CHOLHDL, LDLDIRECT in the last 72 hours.  Lab Results  Component Value Date   HGBA1C 6.0 (H) 07/28/2019   ------------------------------------------------------------------------------------------------------------------ No results for input(s): TSH, T4TOTAL, T3FREE, THYROIDAB in the last 72 hours.  Invalid input(s): FREET3 ------------------------------------------------------------------------------------------------------------------ Recent Labs    07/31/19 0347 08/01/19 0500  FERRITIN 325 420*  TIBC 238*  --   IRON 13*  --     Coagulation profile No results for input(s): INR, PROTIME in the last 168 hours.  Recent Labs    07/31/19 0347 08/01/19 0500  DDIMER 1.04* 1.38*    Cardiac Enzymes No results for input(s): CKMB, TROPONINI, MYOGLOBIN in the last 168 hours.  Invalid input(s): CK ------------------------------------------------------------------------------------------------------------------    Component Value Date/Time   BNP 2,665.5 (H) 07/27/2019 1851    Micro Results Recent Results (from the past 240 hour(s))  Blood culture (routine x 2)     Status: None (Preliminary result)   Collection Time: 07/27/19  7:30 PM    Specimen: BLOOD  Result Value Ref Range Status   Specimen Description BLOOD RIGHT ANTECUBITAL  Final   Special Requests   Final    BOTTLES DRAWN AEROBIC AND ANAEROBIC Blood Culture results may not be optimal due to an inadequate volume of blood received in culture bottles   Culture   Final    NO GROWTH 4 DAYS Performed at [facility], [place] [address] [place], [place] [address]    Report Status PENDING  Incomplete  Blood culture (routine x 2)     Status: None (Preliminary result)   Collection Time: 07/27/19  7:57 PM   Specimen: BLOOD RIGHT HAND  Result Value Ref Range Status   Specimen Description BLOOD RIGHT HAND  Final   Special Requests   Final    BOTTLES DRAWN AEROBIC ONLY Blood Culture results may not be optimal due to an inadequate volume of blood received in culture bottles   Culture   Final    NO GROWTH 4 DAYS Performed at [facility], [place] [address], [place],  [address]    Report Status PENDING  Incomplete    Radiology Reports DG Chest 2 View  Result Date: 07/27/2019 CLINICAL DATA:  Leg swelling and constipation.  Fever. EXAM: CHEST - 2 VIEW COMPARISON:  December 21, 2018 FINDINGS: Cardiomegaly. The hila and mediastinum are normal. No pneumothorax. Mild opacity in the right base is somewhat platelike in appearance. No other infiltrates are noted. No overt edema. IMPRESSION: Platelike opacity in the right base may represent atelectasis but infiltrate such as pneumonia is not excluded. Cardiomegaly. Electronically Signed   By: Dorise Bullion III M.D   On: 07/27/2019 18:57   IR Fluoro Guide CV Line Right  Result Date: 07/29/2019 INDICATION: 49 year old with acute on chronic renal failure with uremia. EXAM: FLUOROSCOPIC AND ULTRASOUND GUIDED PLACEMENT OF A TUNNELED DIALYSIS CATHETER Physician: Stephan Minister. Anselm Pancoast, MD MEDICATIONS: Ancef 2 g; The antibiotic was administered within an appropriate time interval prior to skin puncture. ANESTHESIA/SEDATION: Versed 1.5 mg IV;  Fentanyl 75 mcg IV; Moderate Sedation Time:  18 minutes  The patient was continuously monitored during the procedure by the interventional radiology nurse under my direct supervision. FLUOROSCOPY TIME:  Fluoroscopy Time: 18 seconds, 2 mGy COMPLICATIONS: None immediate. PROCEDURE: The procedure was explained to the patient. The risks and benefits of the procedure were discussed and the patient's questions were addressed. Informed consent was obtained from the patient. The patient was placed supine on the interventional table. Ultrasound confirmed a patent right internal jugular vein. Ultrasound images were obtained for documentation. The right neck and chest was prepped and draped in a sterile fashion. The right neck was anesthetized with 1% lidocaine. Maximal barrier sterile technique was utilized including caps, mask, sterile gowns, sterile gloves, sterile drape, hand hygiene and skin antiseptic. A small incision was made with #11 blade scalpel. A 21 gauge needle directed into the right internal jugular vein with ultrasound guidance. A micropuncture dilator set was placed. A 23 cm tip to cuff Palindrome catheter was selected. The skin below the right clavicle was anesthetized and a small incision was made with an #11 blade scalpel. A subcutaneous tunnel was formed to the vein dermatotomy site. The catheter was brought through the tunnel. The vein dermatotomy site was dilated to accommodate a peel-away sheath. The catheter was placed through the peel-away sheath and directed into the central venous structures. The tip of the catheter was placed in the right atrium with fluoroscopy. Fluoroscopic images were obtained for documentation. Both lumens were found to aspirate and flush well. The proper amount of heparin was flushed in both lumens. The vein dermatotomy site was closed using a single layer of absorbable suture and Dermabond. Gel-Foam placed in subcutaneous tract. The catheter was secured to the skin using  Prolene suture. IMPRESSION: Successful placement of a right jugular tunneled dialysis catheter using ultrasound and fluoroscopic guidance. Electronically Signed   By: Markus Daft M.D.   On: 07/29/2019 15:20   IR US Guide Vasc Access Right  Result Date: 07/29/2019 INDICATION: 49 year old with acute on chronic renal failure with uremia. EXAM: FLUOROSCOPIC AND ULTRASOUND GUIDED PLACEMENT OF A TUNNELED DIALYSIS CATHETER Physician: Stephan Minister. Anselm Pancoast, MD MEDICATIONS: Ancef 2 g; The antibiotic was administered within an appropriate time interval prior to skin puncture. ANESTHESIA/SEDATION: Versed 1.5 mg IV; Fentanyl 75 mcg IV; Moderate Sedation Time:  18 minutes The patient was continuously monitored during the procedure by the interventional radiology nurse under my direct supervision. FLUOROSCOPY TIME:  Fluoroscopy Time: 18 seconds, 2 mGy COMPLICATIONS: None immediate. PROCEDURE: The procedure was explained to the patient. The risks and benefits of the procedure were discussed and the patient's questions were addressed. Informed consent was obtained from the patient. The patient was placed supine on the interventional table. Ultrasound confirmed a patent right internal jugular vein. Ultrasound images were obtained for documentation. The right neck and chest was prepped and draped in a sterile fashion. The right neck was anesthetized with 1% lidocaine. Maximal barrier sterile technique was utilized including caps, mask, sterile gowns, sterile gloves, sterile drape, hand hygiene and skin antiseptic. A small incision was made with #11 blade scalpel. A 21 gauge needle directed into the right internal jugular vein with ultrasound guidance. A micropuncture dilator set was placed. A 23 cm tip to cuff Palindrome catheter was selected. The skin below the right clavicle was anesthetized and a small incision was made with an #11 blade scalpel. A subcutaneous tunnel was formed to the vein dermatotomy site. The catheter was brought  through the tunnel. The vein dermatotomy site was dilated to accommodate a  peel-away sheath. The catheter was placed through the peel-away sheath and directed into the central venous structures. The tip of the catheter was placed in the right atrium with fluoroscopy. Fluoroscopic images were obtained for documentation. Both lumens were found to aspirate and flush well. The proper amount of heparin was flushed in both lumens. The vein dermatotomy site was closed using a single layer of absorbable suture and Dermabond. Gel-Foam placed in subcutaneous tract. The catheter was secured to the skin using Prolene suture. IMPRESSION: Successful placement of a right jugular tunneled dialysis catheter using ultrasound and fluoroscopic guidance. Electronically Signed   By: Markus Daft M.D.   On: 07/29/2019 15:20   ECHOCARDIOGRAM LIMITED  Result Date: 07/28/2019   ECHOCARDIOGRAM LIMITED REPORT   Patient Name:   SARTAJ HOSKIN Date of Exam: 07/28/2019 Medical Rec #:  [id number]       Height:       74.0 in Accession #:    [id number]      Weight:       240.3 lb Date of Birth:  1970/11/11       BSA:          2.35 m Patient Age:    55 years        BP:           120/86 mmHg Patient Gender: M               HR:           76 bpm. Exam Location:  Inpatient  Procedure: Limited Echo, Cardiac Doppler and Color Doppler Indications:    CHF  History:        Patient has prior history of Echocardiogram examinations, most                 recent 06/17/2018. Covid positive, CKD.  Sonographer:    Dustin Flock Referring Phys: [id number] [place]  1. Left ventricular ejection fraction, by visual estimation, is 20 to 25%. The left ventricle has severely decreased function. There is severely increased left ventricular wall thickness.  2. Elevated left atrial pressure.  3. Left ventricular diastolic parameters are consistent with Grade II diastolic dysfunction (pseudonormalization).  4. Mildly dilated left ventricular internal cavity size.   5. The left ventricle demonstrates global hypokinesis.  6. Global right ventricle has severely reduced systolic function.The right ventricular size is normal.  7. Trivial pericardial effusion is present.  8. The mitral valve is normal in structure. Trivial mitral valve regurgitation. No evidence of mitral stenosis.  9. The tricuspid valve was normal in structure. Tricuspid valve regurgitation is mild. 10. Mild aortic valve sclerosis without stenosis. 11. The pulmonic valve was normal in structure. Pulmonic valve regurgitation is mild by color flow Doppler. 12. Aortic dilatation noted. 13. There is mild dilatation of the aortic root measuring 39 mm. 14. Moderately elevated pulmonary artery systolic pressure. 15. The inferior vena cava is dilated in size with <50% respiratory variability, suggesting right atrial pressure of 15 mmHg. 16. Severe global reduction in LV systolic function; severe LVH; mild LVE; severe RV dysfunction; mild TR; moderate pulmonary hypertension. FINDINGS  Left Ventricle: Left ventricular ejection fraction, by visual estimation, is 20 to 25%. The left ventricle has severely decreased function. The left ventricle demonstrates global hypokinesis. The left ventricular internal cavity size was mildly dilated left ventricle. There is severely increased left ventricular wall thickness. Left ventricular diastolic parameters are consistent with Grade II diastolic dysfunction (pseudonormalization). Elevated left atrial pressure. Right  Ventricle: The right ventricular size is normal.Global RV systolic function is has severely reduced systolic function. The tricuspid regurgitant velocity is 2.94 m/s, and with an assumed right atrial pressure of 8 mmHg, the estimated right ventricular systolic pressure is moderately elevated at 42.5 mmHg. Left Atrium: Left atrial size was normal in size. Right Atrium: Right atrial size was normal in size. Right atrial pressure is estimated at 8 mmHg. Pericardium: Trivial  pericardial effusion is present is seen. Trivial pericardial effusion is present. Mitral Valve: The mitral valve is normal in structure. There is mild thickening of the mitral valve leaflet(s). No evidence of mitral valve stenosis by observation. MV Area by PHT, 4.49 cm. MV PHT, 49.01 msec. Trivial mitral valve regurgitation. Tricuspid Valve: The tricuspid valve is normal in structure. Tricuspid valve regurgitation is mild. Aortic Valve: The aortic valve is tricuspid. Aortic valve regurgitation is not visualized. Mild aortic valve sclerosis is present, with no evidence of aortic valve stenosis. Pulmonic Valve: The pulmonic valve was normal in structure. Pulmonic valve regurgitation is mild by color flow Doppler. Pulmonic regurgitation is mild by color flow Doppler. Aorta: Aortic dilatation noted. There is mild dilatation of the aortic root measuring 39 mm. Venous: The inferior vena cava is dilated in size with less than 50% respiratory variability, suggesting right atrial pressure of 15 mmHg. Shunts: There is no evidence of a patent foramen ovale.  Additional Comments: Severe global reduction in LV systolic function; severe LVH; mild LVE; severe RV dysfunction; mild TR; moderate pulmonary hypertension.  LEFT VENTRICLE          Normals PLAX 2D LVIDd:         5.92 cm  3.6 cm   Diastology                 Normals LVIDs:         4.48 cm  1.7 cm   LV e' lateral:   4.70 cm/s 6.42 cm/s LV PW:         1.53 cm  1.4 cm   LV E/e' lateral: 12.4      15.4 LV IVS:        1.72 cm  1.3 cm   LV e' medial:    3.24 cm/s 6.96 cm/s LVOT diam:     2.20 cm  2.0 cm   LV E/e' medial:  18.0      6.96 LV SV:         83 ml    79 ml LV SV Index:   34.46    45 ml/m2 LVOT Area:     3.80 cm 3.14 cm2  LEFT ATRIUM         Index LA diam:    3.90 cm 1.66 cm/m  AORTIC VALVE             Normals LVOT Vmax:   79.60 cm/s LVOT Vmean:  48.000 cm/s 75 cm/s LVOT VTI:    0.130 m     25.3 cm  AORTA                 Normals Ao Root diam: 3.90 cm 31 mm MITRAL  VALVE              Normals   TRICUSPID VALVE             Normals MV Area (PHT): 4.49 cm             TR Peak grad:   34.5 mmHg MV PHT:  49.01 msec 55 ms     TR Vmax:        330.00 cm/s 288 cm/s MV Decel Time: 169 msec   187 ms MV E velocity: 58.30 cm/s 103 cm/s  SHUNTS MV A velocity: 38.10 cm/s 70.3 cm/s Systemic VTI:  0.13 m MV E/A ratio:  1.53       1.5       Systemic Diam: 2.20 cm  Kirk Ruths MD Electronically signed by Kirk Ruths MD Signature Date/Time: 07/28/2019/2:52:57 PMThe mitral valve is normal in structure.    Final

## 2019-08-01 NOTE — Plan of Care (Signed)
Intake and output education provided.  Problem: Fluid Volume: Goal: Compliance with measures to maintain balanced fluid volume will improve Outcome: Progressing   Problem: Nutritional: Goal: Ability to make healthy dietary choices will improve Outcome: Progressing

## 2019-08-01 NOTE — TOC Initial Note (Addendum)
Transition of Care [facility]) - Initial/Assessment Note    Patient Details  Name: Corey Park MRN: [id number] Date of Birth: 06-13-1971  Transition of [facility]) CM/SW Contact:    Maryclare Labrador, RN Phone Number: 08/01/2019, 10:20 AM  Clinical Narrative:   Per p;t he was completely independent from home with his 49 year old son.  Pt is blind and works a full time job at [employer] the [employer].  Pt informed CM that he is in the process of trying to plan how he can  continue working while also being a new HD pt.  Pt confirms that he was aware of the need for permanent HD and will continue to utilize [facility] for transport.  Pt denied all [place] Barriers and any concerns with returning to the home setting.  Pt is in agreement with [facility] PT as recommended - medicare.gov HH choice offered and pt chose [facility] - agency accepts pt.  Pt does not have a PCP and is interested in CM arranging a clinic appt - pt also instructed to contact DSS to secure a long term PCP.   CM requested Dr Rockne Menghini to sign orders until PCP establishment can be secured - Dr Rockne Menghini in agreement.   CM request orders from attending  Expected Discharge Plan: [place] Barriers to Discharge: Waiting for outpatient dialysis   Patient Goals and CMS Choice Patient states their goals for this hospitalization and ongoing recovery are:: Pt states he would like to feel well enought to go back to work - he works for [employer] of the [employer].gov Compare Post Acute Care list provided to:: Patient Choice offered to / list presented to : Patient  Expected Discharge Plan and Services Expected Discharge Plan: [place]       Living arrangements for the past 2 months: [place]: PT [place] Agency: [place] ([place]) Date [place]: 07/31/19 Time [place]: 1630 Representative spoke with at [place]: [place] -  referral given by [place] CSW  Prior Living Arrangements/Services Living arrangements for the past 2 months: Single Family Home Lives with:: Minor Children Patient language and need for interpreter reviewed:: Yes Do you feel safe going back to the place where you live?: Yes      Need for Family Participation in Patient Care: No (Comment) Care giver support system in place?: Yes (comment)   Criminal Activity/Legal Involvement Pertinent to Current Situation/Hospitalization: No - Comment as needed  Activities of Daily Living Home Assistive Devices/Equipment: None ADL Screening (condition at time of admission) Patient's cognitive ability adequate to safely complete daily activities?: Yes Is the patient deaf or have difficulty hearing?: No Does the patient have difficulty seeing, even when wearing glasses/contacts?: Yes Does the patient have difficulty concentrating, remembering, or making decisions?: No Patient able to express need for assistance with ADLs?: Yes Does the patient have difficulty dressing or bathing?: No Independently performs ADLs?: Yes (appropriate for developmental age) Does the patient have difficulty walking or climbing stairs?: No Weakness of Legs: None Weakness of Arms/Hands: None  Permission Sought/Granted   Permission granted to share information with : Yes, Verbal Permission Granted              Emotional Assessment   Attitude/Demeanor/Rapport: Gracious, Charismatic, Self-Confident, Engaged Affect (  typically observed): Accepting, Adaptable Orientation: : Oriented to Self, Oriented to Place, Oriented to  Time, Oriented to Situation   Psych Involvement: No (comment)  Admission diagnosis:  Acute CHF (congestive heart failure) (HCC) [I50.9] Acute on chronic congestive heart failure, unspecified heart failure type ([place]) [I50.9] COVID-19 [U07.1] Patient Active Problem List   Diagnosis Date Noted  . Acute CHF (congestive heart failure) ([place]) 07/27/2019  .  COVID-19 virus infection 07/27/2019  . Anemia in chronic kidney disease (CKD) 12/23/2018  . Fe deficiency anemia 12/23/2018  . Acute renal failure superimposed on stage 5 chronic kidney disease, not on chronic dialysis ([place])   . Symptomatic anemia 12/21/2018  . Chronic systolic heart failure ([place]) 07/03/2018  . Dilated cardiomyopathy ([place]) 07/03/2018  . Elevated troponin   . Chronic kidney disease V 06/17/2018  . Hypomagnesemia 12/13/2016  . Hypertensive urgency 12/11/2016  . AKI (acute kidney injury) ([place]) 12/11/2016  . Fluid overload 12/11/2016  . HLD (hyperlipidemia) 06/14/2007  . DM (diabetes mellitus), type 2 ([place]) 06/11/2007  . Hypertensive heart disease with CHF (congestive heart failure) ([place]) 06/11/2007   PCP:  Patient, No Pcp Per Pharmacy:   [place], [place] [address] [place] [address] Phone: [phone] Fax: [phone]     Social Determinants of Health (SDOH) Interventions    Readmission Risk Interventions No flowsheet data found.

## 2019-08-02 LAB — COMPREHENSIVE METABOLIC PANEL
ALT: 17 U/L (ref 0–44)
AST: 35 U/L (ref 15–41)
Albumin: 2.1 g/dL — ABNORMAL LOW (ref 3.5–5.0)
Alkaline Phosphatase: 53 U/L (ref 38–126)
Anion gap: 14 (ref 5–15)
BUN: 45 mg/dL — ABNORMAL HIGH (ref 6–20)
CO2: 25 mmol/L (ref 22–32)
Calcium: 6.8 mg/dL — ABNORMAL LOW (ref 8.9–10.3)
Chloride: 98 mmol/L (ref 98–111)
Creatinine, Ser: 6.29 mg/dL — ABNORMAL HIGH (ref 0.61–1.24)
GFR calc Af Amer: 11 mL/min — ABNORMAL LOW (ref >60)
GFR calc non Af Amer: 10 mL/min — ABNORMAL LOW (ref >60)
Glucose, Bld: 214 mg/dL — ABNORMAL HIGH (ref 70–99)
Potassium: 3.6 mmol/L (ref 3.5–5.1)
Sodium: 137 mmol/L (ref 135–145)
Total Bilirubin: 0.3 mg/dL (ref 0.3–1.2)
Total Protein: 5.4 g/dL — ABNORMAL LOW (ref 6.5–8.1)

## 2019-08-02 LAB — CBC
HCT: 26.5 % — ABNORMAL LOW (ref 39.0–52.0)
Hemoglobin: 8.4 g/dL — ABNORMAL LOW (ref 13.0–17.0)
MCH: 28.7 pg (ref 26.0–34.0)
MCHC: 31.7 g/dL (ref 30.0–36.0)
MCV: 90.4 fL (ref 80.0–100.0)
Platelets: 155 10*3/uL (ref 150–400)
RBC: 2.93 MIL/uL — ABNORMAL LOW (ref 4.22–5.81)
RDW: 13.9 % (ref 11.5–15.5)
WBC: 7.5 10*3/uL (ref 4.0–10.5)
nRBC: 0 % (ref 0.0–0.2)

## 2019-08-02 LAB — D-DIMER, QUANTITATIVE: D-Dimer, Quant: 1.53 ug/mL-FEU — ABNORMAL HIGH (ref 0.00–0.50)

## 2019-08-02 LAB — C-REACTIVE PROTEIN: CRP: 3 mg/dL — ABNORMAL HIGH (ref <1.0)

## 2019-08-02 LAB — GLUCOSE, CAPILLARY
Glucose-Capillary: 152 mg/dL — ABNORMAL HIGH (ref 70–99)
Glucose-Capillary: 139 mg/dL — ABNORMAL HIGH (ref 70–99)
Glucose-Capillary: 223 mg/dL — ABNORMAL HIGH (ref 70–99)
Glucose-Capillary: 363 mg/dL — ABNORMAL HIGH (ref 70–99)

## 2019-08-02 LAB — FERRITIN: Ferritin: 258 ng/mL (ref 24–336)

## 2019-08-02 MED ORDER — DARBEPOETIN ALFA 100 MCG/0.5ML IJ SOSY
100.0000 ug | PREFILLED_SYRINGE | Freq: Once | INTRAMUSCULAR | Status: AC
  Administered 2019-08-02: 100 ug via SUBCUTANEOUS
  Filled 2019-08-02: qty 0.5

## 2019-08-02 MED ORDER — HEPARIN SODIUM (PORCINE) 1000 UNIT/ML IJ SOLN
INTRAMUSCULAR | Status: AC
  Filled 2019-08-02: qty 4

## 2019-08-02 NOTE — Progress Notes (Signed)
Patient taken by transport to Hemodialysis at this time. No distress noted.  No complaints of pain.  CCMD called and made aware of transport.

## 2019-08-02 NOTE — Plan of Care (Signed)
  Problem: Education: Goal: Knowledge of disease and its progression will improve Outcome: Progressing Goal: Individualized Educational Video(s) Outcome: Progressing   Problem: Fluid Volume: Goal: Compliance with measures to maintain balanced fluid volume will improve Outcome: Progressing   Problem: Health Behavior/Discharge Planning: Goal: Ability to manage health-related needs will improve Outcome: Progressing   Problem: Nutritional: Goal: Ability to make healthy dietary choices will improve Outcome: Progressing   Problem: Clinical Measurements: Goal: Complications related to the disease process, condition or treatment will be avoided or minimized Outcome: Progressing   Problem: Health Behavior/Discharge Planning: Goal: Ability to manage health-related needs will improve Outcome: Progressing

## 2019-08-02 NOTE — Progress Notes (Signed)
[facility] NEPHROLOGY PROGRESS NOTE  Assessment/ Plan: Pt is a 49 y.o. yo male with CKD 5, Covid pneumonia, worsening renal failure to ESRD.  Now on HD.  #AKI on CKD 5 with uremia: Progressed to ESRD.  Status post [facility] placement on 07/29/2019 and started HD. Plan for next HD today.    Permanent access when he is off of Covid precaution. OP HD clinic, Emilie Rutter on Tuesday, 08/05/19 at 12:00pm. Ok to discharge from renal perspective. dc magnesium oxide oral.  # Anemia of CKD: Continue ESA.  Iron saturation 5%.  Start IV iron during HD.  Monitor hemoglobin.  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  Not on binders now.  # HTN/volume monitor blood pressure.  Volume status acceptable.  #Acute hypoxic respiratory failure due to COVID-19 pneumonia: Clinically improved.  Per primary team.  Subjective: No new event.  Chart and lab results reviewed.  Plan for dialysis today. Objective Vital signs in last 24 hours: Vitals:   08/02/19 0835 08/02/19 0903 08/02/19 0928 08/02/19 1000  BP: (!) 148/89 (!) 157/83 133/78 139/77  Pulse: 74 74 63 62  Resp: 16 16 (!) 21 (!) 21  Temp:      TempSrc:      SpO2:      Weight:      Height:       Weight change: -6.5 kg  Intake/Output Summary (Last 24 hours) at 08/02/2019 1038 Last data filed at 08/02/2019 0800 Gross per 24 hour  Intake 1040 ml  Output 800 ml  Net 240 ml       Labs: Basic Metabolic Panel: Recent Labs  Lab 07/29/19 1400 07/29/19 1400 07/30/19 0509 07/31/19 0347 08/01/19 0500  NA 131*   < > 135 135 138  K 4.0   < > 4.0 3.7 3.7  CL 104   < > 105 102 103  CO2 17*   < > 18* 22 25  GLUCOSE 167*   < > 102* 207* 157*  BUN 71*   < > 58* 38* 29*  CREATININE 9.15*   < > 8.72* 6.54* 4.73*  CALCIUM 6.6*   < > 6.9* 6.9* 7.0*  PHOS 4.6  --   --   --  5.0*   < > = values in this interval not displayed.   Liver Function Tests: Recent Labs  Lab 07/30/19 0509 07/31/19 0347 08/01/19 0500  AST 26 29 34  ALT 15 12 14   ALKPHOS 55 50 51   BILITOT 0.6 0.6 0.3  PROT 5.7* 5.3* 5.0*  ALBUMIN 2.1* 1.9* 1.9*   Recent Labs  Lab 07/27/19 1930  LIPASE 63*   No results for input(s): AMMONIA in the last 168 hours. CBC: Recent Labs  Lab 07/29/19 0336 07/29/19 0336 07/29/19 1316 07/29/19 1316 07/30/19 0509 07/31/19 0347 08/01/19 0500  WBC 3.3*   < > 2.7*   < > 3.4* 1.5* 5.5  NEUTROABS 2.6   < >  --   --  2.7 1.2* 4.9  HGB 8.1*   < > 7.8*   < > 8.0* 7.7* 8.2*  HCT 25.1*   < > 24.1*   < > 24.4* 23.6* 24.6*  MCV 90.6  --  89.6  --  88.1 89.7 87.2  PLT 99*   < > 100*   < > 101* 99* 129*   < > = values in this interval not displayed.   Cardiac Enzymes: No results for input(s): CKTOTAL, CKMB, CKMBINDEX, TROPONINI in the last 168 hours. CBG:  Recent Labs  Lab 08/01/19 0742 08/01/19 1150 08/01/19 1715 08/01/19 2132 08/02/19 0656  GLUCAP 140* 188* 281* 248* 152*    Iron Studies:  Recent Labs    07/31/19 0347 07/31/19 0347 08/01/19 0500  IRON 13*  --   --   TIBC 238*  --   --   FERRITIN 325   < > 420*   < > = values in this interval not displayed.   Studies/Results: No results found.  Medications: Infusions: . sodium chloride    . sodium chloride    . ferric gluconate (FERRLECIT/NULECIT) IV    . remdesivir 100 mg in NS 100 mL 100 mg (08/01/19 0834)    Scheduled Medications: . atorvastatin  20 mg Oral q1800  . carvedilol  25 mg Oral BID WC  . Chlorhexidine Gluconate Cloth  6 each Topical Q0600  . Chlorhexidine Gluconate Cloth  6 each Topical Q0600  . darbepoetin (ARANESP) injection - DIALYSIS  100 mcg Intravenous Q Sat-HD  . dexamethasone  6 mg Oral Daily  . ferrous sulfate  325 mg Oral Daily  . heparin injection (subcutaneous)  5,000 Units Subcutaneous Q8H  . hydrALAZINE  25 mg Oral Q8H  . [START ON 08/03/2019] influenza vac split quadrivalent PF  0.5 mL Intramuscular Once  . insulin aspart  0-6 Units Subcutaneous TID WC  . magnesium oxide  400 mg Oral BID  . polyethylene glycol  17 g Oral Daily  .  senna-docusate  2 tablet Oral BID    have reviewed scheduled and prn medications.  Physical Exam: From 2/5 General:NAD, comfortable Heart:RRR, s1s2 nl Lungs:clear b/l, no crackle Abdomen:soft, Non-tender, non-distended Extremities:No edema Dialysis Access: Right IJ TDC.  Patient was not examined today because of COVID-19 infection in order to minimize exposure and to preserve PPE.  The physical examination of primary team reviewed and discussed.  Kaliah Haddaway Prasad Jeffrie Stander 08/02/2019,10:38 AM  LOS: 6 days  Pager: [phone]

## 2019-08-02 NOTE — Progress Notes (Signed)
PROGRESS NOTE                                                                                                                                                                                                             Patient Demographics:    Corey Park, is a 49 y.o. male, DOB - 02-Sep-1970, SWF:[id number]  Outpatient Primary MD for the patient is Patient, No Pcp Per   Admit date - 07/27/2019   LOS - 6  Chief Complaint  Patient presents with  . Constipation  . Hypertension  . Leg Swelling       Brief Narrative: Patient is a 49 y.o. 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  See below for further details.   Subjective:   No further hiccups-minimal cough-clinically improved-going for HD today.   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  Volume status much improved following HD.  Plans are to discharge on 2/7-and continue with HD in the outpatient setting.  Volume overload/decompensated heart failure (EF 20-25% per TTE on 07/28/2019) in the setting of progression to ESRD: Volume status much improved following HD.  No longer on diuretics.   Moderate pulmonary hypertension: Likely secondary to left-sided heart failure-stable for outpatient follow-up and monitoring.  Acute Hypoxic Resp Failure due to Covid 19 Viral pneumonia: Not hypoxic-remains on remdesivir/steroids-thankfully CRP is now plateauing.  Continue close monitoring/remdesivir/steroids as patient is at risk for severe disease given his underlying medical comorbidities.   Fever: afebrile  O2 requirements:  SpO2: 99 % O2 Flow Rate (L/min): 2 L/min   COVID-19 Labs: Recent Labs    07/31/19 0347 08/01/19 0500    DDIMER 1.04* 1.38*  FERRITIN 325 420*  CRP 5.5* 4.6*       Component Value Date/Time   BNP 2,665.5 (H) 07/27/2019 1851    No results for input(s): PROCALCITON in the last 168 hours.  Lab Results  Component Value Date   [place] NEGATIVE 12/21/2018     COVID-19 Medications: Steroids:2/3>> Remdesivir:2/3>>  Prone/Incentive Spirometry: encouraged incentive spirometry use 3-4/hour.  DVT Prophylaxis  :   Heparin  HTN: Better controlled-continue Coreg and hydralazine-if BP starts increasing-we will add nitrates.    Hiccups: Seems to have resolved-likely secondary to uremia.  On as needed Reglan.  Anemia: Secondary to CKD-globin stable-iron and Aranesp-being managed by nephrology.  DM-2 (A1C 6.0 on 07/28/19): CBG stable-on SSI-does not appear to be on any oral hypoglycemics as outpatient.  CBG (last 3)  Recent Labs    08/01/19 1715 08/01/19 2132 08/02/19 0656  GLUCAP 281* 248* 152*   Obesity: Estimated body mass index is 29.49 kg/m as calculated from the following:   Height as of this encounter: 6\' 2"  (1.88 m).   Weight as of this encounter: 104.2 kg.   Legally Blind  Consults  : Cardiology/nephrology  Procedures  :  2/2: Tunneled HD catheter placement by IR  ABG:    Component Value Date/Time   TCO2 24 01/27/2018 1309    Vent Settings: N/A  Condition -Stable  Family Communication  :  Mother over phone on 2/6  Code Status :  Full Code  Diet :  Diet Order            Diet renal with fluid restriction Fluid restriction: 1200 mL Fluid; Room service appropriate? Yes; Fluid consistency: Thin  Diet effective now               Disposition Plan  :  Remain hospitalized-Home 2/7-when he completes his last dose of remdesivir.  Barriers to discharge: Elevated CRP-risk factors for severe COVID-19 pneumonia-remains on IV remdesivir need to complete 5 days (last dose on 2/7)  Antimicorbials  :    Anti-infectives (From admission, onward)   Start      Dose/Rate Route Frequency Ordered Stop   07/31/19 1000  remdesivir 100 mg in sodium chloride 0.9 % 100 mL IVPB     100 mg 200 mL/hr over 30 Minutes Intravenous Daily 07/30/19 1636 08/04/19 0959   07/30/19 1700  remdesivir 200 mg in sodium chloride 0.9% 250 mL IVPB     200 mg 580 mL/hr over 30 Minutes Intravenous Once 07/30/19 1636 07/30/19 1805   07/29/19 1230  ceFAZolin (ANCEF) IVPB 2g/100 mL premix     2 g 200 mL/hr over 30 Minutes Intravenous  Once 07/29/19 1216 07/29/19 1250   07/29/19 1217  ceFAZolin (ANCEF) 2-4 GM/100ML-% IVPB    Note to Pharmacy: Kandy Garrison   : cabinet override      07/29/19 1217 07/30/19 0029   07/27/19 1915  cefTRIAXone (ROCEPHIN) 1 g in sodium chloride 0.9 % 100 mL IVPB     1 g 200 mL/hr over 30 Minutes Intravenous  Once 07/27/19 1909 07/27/19 2027   07/27/19 1915  azithromycin (ZITHROMAX) 500 mg in sodium chloride 0.9 % 250 mL IVPB  Status:  Discontinued     500 mg 250 mL/hr over 60 Minutes Intravenous  Once 07/27/19 1909 07/27/19 2016      Inpatient Medications  Scheduled Meds: . atorvastatin  20 mg Oral q1800  . carvedilol  25 mg Oral BID WC  . Chlorhexidine Gluconate Cloth  6 each Topical Q0600  . Chlorhexidine Gluconate Cloth  6 each Topical Q0600  . darbepoetin (ARANESP) injection - DIALYSIS  100 mcg Intravenous Q Sat-HD  . dexamethasone  6 mg Oral Daily  . ferrous sulfate  325 mg Oral Daily  . heparin      . heparin injection (subcutaneous)  5,000 Units Subcutaneous Q8H  . hydrALAZINE  25 mg Oral Q8H  . [START ON 08/03/2019] influenza vac split quadrivalent  PF  0.5 mL Intramuscular Once  . insulin aspart  0-6 Units Subcutaneous TID WC  . polyethylene glycol  17 g Oral Daily  . senna-docusate  2 tablet Oral BID   Continuous Infusions: . sodium chloride    . sodium chloride    . ferric gluconate (FERRLECIT/NULECIT) IV    . remdesivir 100 mg in NS 100 mL 100 mg (08/01/19 0834)   PRN Meds:.sodium chloride, sodium chloride,  acetaminophen, alteplase, bisacodyl, heparin, lidocaine (PF), lidocaine-prilocaine, metoCLOPramide (REGLAN) injection, pentafluoroprop-tetrafluoroeth   Time Spent in minutes  25  See all Orders from today for further details   Oren Binet M.D on 08/02/2019 at 11:39 AM  To page go to [email or web address] - use universal password  [facility] -  Office  [phone]    Objective:   Vitals:   08/02/19 1000 08/02/19 1030 08/02/19 1100 08/02/19 1132  BP: 139/77 (!) 154/90 137/81 139/81  Pulse: 62 65 60 60  Resp: (!) 21 18 20 20   Temp:      TempSrc:      SpO2:      Weight:      Height:        Wt Readings from Last 3 Encounters:  08/02/19 104.2 kg  01/16/19 105.7 kg  12/24/18 106.5 kg     Intake/Output Summary (Last 24 hours) at 08/02/2019 1139 Last data filed at 08/02/2019 0800 Gross per 24 hour  Intake 1040 ml  Output 800 ml  Net 240 ml     Physical Exam Gen Exam:Alert awake-not in any distress HEENT:atraumatic, normocephalic Chest: B/L clear to auscultation anteriorly CVS:S1S2 regular Abdomen:soft non tender, non distended Extremities:no edema Neurology: Non focal Skin: no rash   Data Review:    CBC Recent Labs  Lab 07/27/19 1851 07/27/19 1851 07/29/19 0336 07/29/19 1316 07/30/19 0509 07/31/19 0347 08/01/19 0500  WBC 4.9   < > 3.3* 2.7* 3.4* 1.5* 5.5  HGB 8.9*   < > 8.1* 7.8* 8.0* 7.7* 8.2*  HCT 27.7*   < > 25.1* 24.1* 24.4* 23.6* 24.6*  PLT 146*   < > 99* 100* 101* 99* 129*  MCV 92.0   < > 90.6 89.6 88.1 89.7 87.2  MCH 29.6   < > 29.2 29.0 28.9 29.3 29.1  MCHC 32.1   < > 32.3 32.4 32.8 32.6 33.3  RDW 14.1   < > 14.1 14.0 13.8 13.8 13.8  LYMPHSABS 0.5*  --  0.5*  --  0.6* 0.2* 0.4*  MONOABS 0.3  --  0.1  --  0.2 0.1 0.3  EOSABS 0.0  --  0.1  --  0.0 0.0 0.0  BASOSABS 0.0  --  0.0  --  0.0 0.0 0.0   < > = values in this interval not displayed.    Chemistries  Recent Labs  Lab 07/28/19 0203 07/28/19 0203 07/29/19 0336 07/29/19 1400  07/30/19 0509 07/31/19 0347 08/01/19 0500  NA 138   < > 136 131* 135 135 138  K 4.2   < > 4.2 4.0 4.0 3.7 3.7  CL 107   < > 104 104 105 102 103  CO2 18*   < > 17* 17* 18* 22 25  GLUCOSE 101*   < > 99 167* 102* 207* 157*  BUN 68*   < > 71* 71* 58* 38* 29*  CREATININE 9.02*   < > 9.22* 9.15* 8.72* 6.54* 4.73*  CALCIUM 7.5*   < > 7.3* 6.6* 6.9* 6.9* 7.0*  MG 1.6*  --  1.4*  --  1.5* 1.6* 1.5*  AST 23  --  21  --  26 29 34  ALT 18  --  16  --  15 12 14   ALKPHOS 51  --  47  --  55 50 51  BILITOT 0.6  --  0.6  --  0.6 0.6 0.3   < > = values in this interval not displayed.   ------------------------------------------------------------------------------------------------------------------ No results for input(s): CHOL, HDL, LDLCALC, TRIG, CHOLHDL, LDLDIRECT in the last 72 hours.  Lab Results  Component Value Date   HGBA1C 6.0 (H) 07/28/2019   ------------------------------------------------------------------------------------------------------------------ No results for input(s): TSH, T4TOTAL, T3FREE, THYROIDAB in the last 72 hours.  Invalid input(s): FREET3 ------------------------------------------------------------------------------------------------------------------ Recent Labs    07/31/19 0347 08/01/19 0500  FERRITIN 325 420*  TIBC 238*  --   IRON 13*  --     Coagulation profile No results for input(s): INR, PROTIME in the last 168 hours.  Recent Labs    07/31/19 0347 08/01/19 0500  DDIMER 1.04* 1.38*    Cardiac Enzymes No results for input(s): CKMB, TROPONINI, MYOGLOBIN in the last 168 hours.  Invalid input(s): CK ------------------------------------------------------------------------------------------------------------------    Component Value Date/Time   BNP 2,665.5 (H) 07/27/2019 1851    Micro Results Recent Results (from the past 240 hour(s))  Blood culture (routine x 2)     Status: None   Collection Time: 07/27/19  7:30 PM   Specimen: BLOOD    Result Value Ref Range Status   Specimen Description BLOOD RIGHT ANTECUBITAL  Final   Special Requests   Final    BOTTLES DRAWN AEROBIC AND ANAEROBIC Blood Culture results may not be optimal due to an inadequate volume of blood received in culture bottles   Culture   Final    NO GROWTH 5 DAYS Performed at [facility], [place] [address] [place], [place]  [address]    Report Status 08/01/2019 FINAL  Final  Blood culture (routine x 2)     Status: None   Collection Time: 07/27/19  7:57 PM   Specimen: BLOOD RIGHT HAND  Result Value Ref Range Status   Specimen Description BLOOD RIGHT HAND  Final   Special Requests   Final    BOTTLES DRAWN AEROBIC ONLY Blood Culture results may not be optimal due to an inadequate volume of blood received in culture bottles   Culture   Final    NO GROWTH 5 DAYS Performed at [facility], [place] [address] [place], [place] [address]    Report Status 08/01/2019 FINAL  Final    Radiology Reports DG Chest 2 View  Result Date: 07/27/2019 CLINICAL DATA:  Leg swelling and constipation.  Fever. EXAM: CHEST - 2 VIEW COMPARISON:  December 21, 2018 FINDINGS: Cardiomegaly. The hila and mediastinum are normal. No pneumothorax. Mild opacity in the right base is somewhat platelike in appearance. No other infiltrates are noted. No overt edema. IMPRESSION: Platelike opacity in the right base may represent atelectasis but infiltrate such as pneumonia is not excluded. Cardiomegaly. Electronically Signed   By: Dorise Bullion III M.D   On: 07/27/2019 18:57   IR Fluoro Guide CV Line Right  Result Date: 07/29/2019 INDICATION: 49 year old with acute on chronic renal failure with uremia. EXAM: FLUOROSCOPIC AND ULTRASOUND GUIDED PLACEMENT OF A TUNNELED DIALYSIS CATHETER Physician: Stephan Minister. Anselm Pancoast, MD MEDICATIONS: Ancef 2 g; The antibiotic was administered within an appropriate time interval prior to skin puncture. ANESTHESIA/SEDATION: Versed 1.5 mg IV; Fentanyl 75 mcg IV;  Moderate  Sedation Time:  18 minutes The patient was continuously monitored during the procedure by the interventional radiology nurse under my direct supervision. FLUOROSCOPY TIME:  Fluoroscopy Time: 18 seconds, 2 mGy COMPLICATIONS: None immediate. PROCEDURE: The procedure was explained to the patient. The risks and benefits of the procedure were discussed and the patient's questions were addressed. Informed consent was obtained from the patient. The patient was placed supine on the interventional table. Ultrasound confirmed a patent right internal jugular vein. Ultrasound images were obtained for documentation. The right neck and chest was prepped and draped in a sterile fashion. The right neck was anesthetized with 1% lidocaine. Maximal barrier sterile technique was utilized including caps, mask, sterile gowns, sterile gloves, sterile drape, hand hygiene and skin antiseptic. A small incision was made with #11 blade scalpel. A 21 gauge needle directed into the right internal jugular vein with ultrasound guidance. A micropuncture dilator set was placed. A 23 cm tip to cuff Palindrome catheter was selected. The skin below the right clavicle was anesthetized and a small incision was made with an #11 blade scalpel. A subcutaneous tunnel was formed to the vein dermatotomy site. The catheter was brought through the tunnel. The vein dermatotomy site was dilated to accommodate a peel-away sheath. The catheter was placed through the peel-away sheath and directed into the central venous structures. The tip of the catheter was placed in the right atrium with fluoroscopy. Fluoroscopic images were obtained for documentation. Both lumens were found to aspirate and flush well. The proper amount of heparin was flushed in both lumens. The vein dermatotomy site was closed using a single layer of absorbable suture and Dermabond. Gel-Foam placed in subcutaneous tract. The catheter was secured to the skin using Prolene suture. IMPRESSION:  Successful placement of a right jugular tunneled dialysis catheter using ultrasound and fluoroscopic guidance. Electronically Signed   By: Markus Daft M.D.   On: 07/29/2019 15:20   IR US Guide Vasc Access Right  Result Date: 07/29/2019 INDICATION: 49 year old with acute on chronic renal failure with uremia. EXAM: FLUOROSCOPIC AND ULTRASOUND GUIDED PLACEMENT OF A TUNNELED DIALYSIS CATHETER Physician: Stephan Minister. Anselm Pancoast, MD MEDICATIONS: Ancef 2 g; The antibiotic was administered within an appropriate time interval prior to skin puncture. ANESTHESIA/SEDATION: Versed 1.5 mg IV; Fentanyl 75 mcg IV; Moderate Sedation Time:  18 minutes The patient was continuously monitored during the procedure by the interventional radiology nurse under my direct supervision. FLUOROSCOPY TIME:  Fluoroscopy Time: 18 seconds, 2 mGy COMPLICATIONS: None immediate. PROCEDURE: The procedure was explained to the patient. The risks and benefits of the procedure were discussed and the patient's questions were addressed. Informed consent was obtained from the patient. The patient was placed supine on the interventional table. Ultrasound confirmed a patent right internal jugular vein. Ultrasound images were obtained for documentation. The right neck and chest was prepped and draped in a sterile fashion. The right neck was anesthetized with 1% lidocaine. Maximal barrier sterile technique was utilized including caps, mask, sterile gowns, sterile gloves, sterile drape, hand hygiene and skin antiseptic. A small incision was made with #11 blade scalpel. A 21 gauge needle directed into the right internal jugular vein with ultrasound guidance. A micropuncture dilator set was placed. A 23 cm tip to cuff Palindrome catheter was selected. The skin below the right clavicle was anesthetized and a small incision was made with an #11 blade scalpel. A subcutaneous tunnel was formed to the vein dermatotomy site. The catheter was brought through the tunnel. The vein  dermatotomy site was dilated to accommodate a peel-away sheath. The catheter was placed through the peel-away sheath and directed into the central venous structures. The tip of the catheter was placed in the right atrium with fluoroscopy. Fluoroscopic images were obtained for documentation. Both lumens were found to aspirate and flush well. The proper amount of heparin was flushed in both lumens. The vein dermatotomy site was closed using a single layer of absorbable suture and Dermabond. Gel-Foam placed in subcutaneous tract. The catheter was secured to the skin using Prolene suture. IMPRESSION: Successful placement of a right jugular tunneled dialysis catheter using ultrasound and fluoroscopic guidance. Electronically Signed   By: Markus Daft M.D.   On: 07/29/2019 15:20   ECHOCARDIOGRAM LIMITED  Result Date: 07/28/2019   ECHOCARDIOGRAM LIMITED REPORT   Patient Name:   DADRIAN BALLANTINE Date of Exam: 07/28/2019 Medical Rec #:  [id number]       Height:       74.0 in Accession #:    [id number]      Weight:       240.3 lb Date of Birth:  1971-06-25       BSA:          2.35 m Patient Age:    19 years        BP:           120/86 mmHg Patient Gender: M               HR:           76 bpm. Exam Location:  Inpatient  Procedure: Limited Echo, Cardiac Doppler and Color Doppler Indications:    CHF  History:        Patient has prior history of Echocardiogram examinations, most                 recent 06/17/2018. Covid positive, CKD.  Sonographer:    Dustin Flock Referring Phys: [id number] [place]  1. Left ventricular ejection fraction, by visual estimation, is 20 to 25%. The left ventricle has severely decreased function. There is severely increased left ventricular wall thickness.  2. Elevated left atrial pressure.  3. Left ventricular diastolic parameters are consistent with Grade II diastolic dysfunction (pseudonormalization).  4. Mildly dilated left ventricular internal cavity size.  5. The left ventricle  demonstrates global hypokinesis.  6. Global right ventricle has severely reduced systolic function.The right ventricular size is normal.  7. Trivial pericardial effusion is present.  8. The mitral valve is normal in structure. Trivial mitral valve regurgitation. No evidence of mitral stenosis.  9. The tricuspid valve was normal in structure. Tricuspid valve regurgitation is mild. 10. Mild aortic valve sclerosis without stenosis. 11. The pulmonic valve was normal in structure. Pulmonic valve regurgitation is mild by color flow Doppler. 12. Aortic dilatation noted. 13. There is mild dilatation of the aortic root measuring 39 mm. 14. Moderately elevated pulmonary artery systolic pressure. 15. The inferior vena cava is dilated in size with <50% respiratory variability, suggesting right atrial pressure of 15 mmHg. 16. Severe global reduction in LV systolic function; severe LVH; mild LVE; severe RV dysfunction; mild TR; moderate pulmonary hypertension. FINDINGS  Left Ventricle: Left ventricular ejection fraction, by visual estimation, is 20 to 25%. The left ventricle has severely decreased function. The left ventricle demonstrates global hypokinesis. The left ventricular internal cavity size was mildly dilated left ventricle. There is severely increased left ventricular wall thickness. Left ventricular diastolic parameters are consistent with Grade II diastolic  dysfunction (pseudonormalization). Elevated left atrial pressure. Right Ventricle: The right ventricular size is normal.Global RV systolic function is has severely reduced systolic function. The tricuspid regurgitant velocity is 2.94 m/s, and with an assumed right atrial pressure of 8 mmHg, the estimated right ventricular systolic pressure is moderately elevated at 42.5 mmHg. Left Atrium: Left atrial size was normal in size. Right Atrium: Right atrial size was normal in size. Right atrial pressure is estimated at 8 mmHg. Pericardium: Trivial pericardial effusion is  present is seen. Trivial pericardial effusion is present. Mitral Valve: The mitral valve is normal in structure. There is mild thickening of the mitral valve leaflet(s). No evidence of mitral valve stenosis by observation. MV Area by PHT, 4.49 cm. MV PHT, 49.01 msec. Trivial mitral valve regurgitation. Tricuspid Valve: The tricuspid valve is normal in structure. Tricuspid valve regurgitation is mild. Aortic Valve: The aortic valve is tricuspid. Aortic valve regurgitation is not visualized. Mild aortic valve sclerosis is present, with no evidence of aortic valve stenosis. Pulmonic Valve: The pulmonic valve was normal in structure. Pulmonic valve regurgitation is mild by color flow Doppler. Pulmonic regurgitation is mild by color flow Doppler. Aorta: Aortic dilatation noted. There is mild dilatation of the aortic root measuring 39 mm. Venous: The inferior vena cava is dilated in size with less than 50% respiratory variability, suggesting right atrial pressure of 15 mmHg. Shunts: There is no evidence of a patent foramen ovale.  Additional Comments: Severe global reduction in LV systolic function; severe LVH; mild LVE; severe RV dysfunction; mild TR; moderate pulmonary hypertension.  LEFT VENTRICLE          Normals PLAX 2D LVIDd:         5.92 cm  3.6 cm   Diastology                 Normals LVIDs:         4.48 cm  1.7 cm   LV e' lateral:   4.70 cm/s 6.42 cm/s LV PW:         1.53 cm  1.4 cm   LV E/e' lateral: 12.4      15.4 LV IVS:        1.72 cm  1.3 cm   LV e' medial:    3.24 cm/s 6.96 cm/s LVOT diam:     2.20 cm  2.0 cm   LV E/e' medial:  18.0      6.96 LV SV:         83 ml    79 ml LV SV Index:   34.46    45 ml/m2 LVOT Area:     3.80 cm 3.14 cm2  LEFT ATRIUM         Index LA diam:    3.90 cm 1.66 cm/m  AORTIC VALVE             Normals LVOT Vmax:   79.60 cm/s LVOT Vmean:  48.000 cm/s 75 cm/s LVOT VTI:    0.130 m     25.3 cm  AORTA                 Normals Ao Root diam: 3.90 cm 31 mm MITRAL VALVE              Normals    TRICUSPID VALVE             Normals MV Area (PHT): 4.49 cm             TR Peak grad:  34.5 mmHg MV PHT:        49.01 msec 55 ms     TR Vmax:        330.00 cm/s 288 cm/s MV Decel Time: 169 msec   187 ms MV E velocity: 58.30 cm/s 103 cm/s  SHUNTS MV A velocity: 38.10 cm/s 70.3 cm/s Systemic VTI:  0.13 m MV E/A ratio:  1.53       1.5       Systemic Diam: 2.20 cm  Kirk Ruths MD Electronically signed by Kirk Ruths MD Signature Date/Time: 07/28/2019/2:52:57 PMThe mitral valve is normal in structure.    Final

## 2019-08-02 NOTE — Progress Notes (Signed)
Patient returned to unit no distress noted.  Assisted with lunch at this time.

## 2019-08-02 NOTE — Progress Notes (Signed)
   08/02/19 0224  ECG Monitoring  Cardiac Rhythm NSR;VT;Other (Comment) (4 BT's VTach-RN Jewel Venditto Notified)  Patient had 4 beat run of VTach @ 0224. Currently NSR in 70s. Denies chest pain or SOB. MD notified.

## 2019-08-03 DIAGNOSIS — I509 Heart failure, unspecified: Secondary | ICD-10-CM

## 2019-08-03 LAB — COMPREHENSIVE METABOLIC PANEL
ALT: 21 U/L (ref 0–44)
AST: 36 U/L (ref 15–41)
Albumin: 2.1 g/dL — ABNORMAL LOW (ref 3.5–5.0)
Alkaline Phosphatase: 69 U/L (ref 38–126)
Anion gap: 8 (ref 5–15)
BUN: 32 mg/dL — ABNORMAL HIGH (ref 6–20)
CO2: 26 mmol/L (ref 22–32)
Calcium: 6.6 mg/dL — ABNORMAL LOW (ref 8.9–10.3)
Chloride: 100 mmol/L (ref 98–111)
Creatinine, Ser: 4.85 mg/dL — ABNORMAL HIGH (ref 0.61–1.24)
GFR calc Af Amer: 15 mL/min — ABNORMAL LOW (ref >60)
GFR calc non Af Amer: 13 mL/min — ABNORMAL LOW (ref >60)
Glucose, Bld: 266 mg/dL — ABNORMAL HIGH (ref 70–99)
Potassium: 3.7 mmol/L (ref 3.5–5.1)
Sodium: 134 mmol/L — ABNORMAL LOW (ref 135–145)
Total Bilirubin: 0.5 mg/dL (ref 0.3–1.2)
Total Protein: 5.4 g/dL — ABNORMAL LOW (ref 6.5–8.1)

## 2019-08-03 LAB — CBC
HCT: 26.6 % — ABNORMAL LOW (ref 39.0–52.0)
Hemoglobin: 8.6 g/dL — ABNORMAL LOW (ref 13.0–17.0)
MCH: 28.5 pg (ref 26.0–34.0)
MCHC: 32.3 g/dL (ref 30.0–36.0)
MCV: 88.1 fL (ref 80.0–100.0)
Platelets: 185 10*3/uL (ref 150–400)
RBC: 3.02 MIL/uL — ABNORMAL LOW (ref 4.22–5.81)
RDW: 13.6 % (ref 11.5–15.5)
WBC: 7.4 10*3/uL (ref 4.0–10.5)
nRBC: 0 % (ref 0.0–0.2)

## 2019-08-03 LAB — GLUCOSE, CAPILLARY
Glucose-Capillary: 171 mg/dL — ABNORMAL HIGH (ref 70–99)
Glucose-Capillary: 201 mg/dL — ABNORMAL HIGH (ref 70–99)

## 2019-08-03 LAB — D-DIMER, QUANTITATIVE: D-Dimer, Quant: 1.42 ug/mL-FEU — ABNORMAL HIGH (ref 0.00–0.50)

## 2019-08-03 LAB — C-REACTIVE PROTEIN: CRP: 2.3 mg/dL — ABNORMAL HIGH (ref <1.0)

## 2019-08-03 LAB — FERRITIN: Ferritin: 232 ng/mL (ref 24–336)

## 2019-08-03 MED ORDER — FERROUS SULFATE 325 (65 FE) MG PO TABS: 325.0000 mg | ORAL_TABLET | Freq: Every day | ORAL | 0 refills | Status: DC

## 2019-08-03 MED ORDER — ATORVASTATIN CALCIUM 20 MG PO TABS: ORAL_TABLET | ORAL | 0 refills | Status: DC

## 2019-08-03 MED ORDER — CARVEDILOL 25 MG PO TABS: 25.0000 mg | ORAL_TABLET | Freq: Two times a day (BID) | ORAL | 0 refills | Status: DC

## 2019-08-03 MED ORDER — HYDRALAZINE HCL 20 MG/ML IJ SOLN
10.0000 mg | Freq: Once | INTRAMUSCULAR | Status: AC
  Administered 2019-08-03: 10 mg via INTRAVENOUS
  Filled 2019-08-03: qty 1

## 2019-08-03 MED ORDER — HYDRALAZINE HCL 50 MG PO TABS: 50.0000 mg | ORAL_TABLET | Freq: Three times a day (TID) | ORAL | 0 refills | Status: DC

## 2019-08-03 NOTE — Progress Notes (Signed)
Informed Dr. Silas [place] of elevated BP after scheduled meds. No PRN meds available.

## 2019-08-03 NOTE — TOC Transition Note (Signed)
Transition of Care [facility]) - CM/SW Discharge Note   Patient Details  Name: Gillis Boardley MRN: [id number] Date of Birth: 26-Jan-1971  [facility] CM/SW Contact:  Carles Collet, RN Phone Number: 08/03/2019, 10:42 AM   Clinical Narrative:    Notified [facility] that patient will DC today. PTAR forms printed to chart. Address verified w patient and son will be home to let him in the apartment. Notified nurse, and PTAR will be set up for 11:30ish.     Final next level of care: [place] Barriers to Discharge: No Barriers Identified   Patient Goals and CMS Choice Patient states their goals for this hospitalization and ongoing recovery are:: Pt states he would like to feel well enought to go back to work - he works for [employer] the [employer].[email or web address] Compare Post Acute Care list provided to:: Patient Choice offered to / list presented to : Patient  Discharge Placement                       Discharge Plan and Services                          HH Arranged: PT [facility] Agency: [place] ([facility]) Date [place]: 08/03/19 Time [place]: 1042 Representative spoke with at [place]: [place] ([place]) Interventions     Readmission Risk Interventions No flowsheet data found.

## 2019-08-03 NOTE — Progress Notes (Signed)
Patient given written discharge instructions and verbalized understanding. He stated that his son is at home to assist with instructions and medications at home too. He dressed himself, PIV x1 removed and patient discharged home via [place].

## 2019-08-03 NOTE — Progress Notes (Signed)
[facility] NEPHROLOGY PROGRESS NOTE  Assessment/ Plan: Pt is a 49 y.o. yo male with CKD 5, Covid pneumonia, worsening renal failure to ESRD.  Now on HD.  #AKI on CKD 5 with uremia: Progressed to ESRD.  Status post [facility] placement on 07/29/2019 and started HD.  Permanent access when he is off of Covid precaution. Last HD yesterday.  Tolerated well with 2000 cc UF. OP HD clinic, Emilie Rutter on Tuesday, 08/05/19 at 12:00pm.  The patient is being discharged today.  dc magnesium oxide oral.  # Anemia of CKD: Continue ESA.  Iron saturation 5%.  Start IV iron during HD.  Monitor hemoglobin.  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  Not on binders now.  # HTN/volume monitor blood pressure.  Volume status acceptable.  #Acute hypoxic respiratory failure due to COVID-19 pneumonia: Clinically improved.  Per primary team.  Subjective: No new event.  Chart and lab results reviewed.  Plan for discharge today.  Objective Vital signs in last 24 hours: Vitals:   08/03/19 0618 08/03/19 0649 08/03/19 0701 08/03/19 0757  BP: (!) 149/102 (!) 143/100  (!) 142/104  Pulse: 71   73  Resp:    14  Temp:    97.8 F (36.6 C)  TempSrc:    Oral  SpO2:    99%  Weight:   101.5 kg   Height:       Weight change: 2.4 kg  Intake/Output Summary (Last 24 hours) at 08/03/2019 1039 Last data filed at 08/02/2019 1900 Gross per 24 hour  Intake 550.17 ml  Output 2000 ml  Net -1449.83 ml       Labs: Basic Metabolic Panel: Recent Labs  Lab 07/29/19 1400 07/30/19 0509 08/01/19 0500 08/02/19 1151 08/03/19 0406  NA 131*   < > 138 137 134*  K 4.0   < > 3.7 3.6 3.7  CL 104   < > 103 98 100  CO2 17*   < > 25 25 26   GLUCOSE 167*   < > 157* 214* 266*  BUN 71*   < > 29* 45* 32*  CREATININE 9.15*   < > 4.73* 6.29* 4.85*  CALCIUM 6.6*   < > 7.0* 6.8* 6.6*  PHOS 4.6  --  5.0*  --   --    < > = values in this interval not displayed.   Liver Function Tests: Recent Labs  Lab 08/01/19 0500 08/02/19 1151 08/03/19 0406   AST 34 35 36  ALT 14 17 21   ALKPHOS 51 53 69  BILITOT 0.3 0.3 0.5  PROT 5.0* 5.4* 5.4*  ALBUMIN 1.9* 2.1* 2.1*   Recent Labs  Lab 07/27/19 1930  LIPASE 63*   No results for input(s): AMMONIA in the last 168 hours. CBC: Recent Labs  Lab 07/30/19 0509 07/30/19 0509 07/31/19 0347 07/31/19 0347 08/01/19 0500 08/02/19 1151 08/03/19 0406  WBC 3.4*   < > 1.5*   < > 5.5 7.5 7.4  NEUTROABS 2.7  --  1.2*  --  4.9  --   --   HGB 8.0*   < > 7.7*   < > 8.2* 8.4* 8.6*  HCT 24.4*   < > 23.6*   < > 24.6* 26.5* 26.6*  MCV 88.1  --  89.7  --  87.2 90.4 88.1  PLT 101*   < > 99*   < > 129* 155 185   < > = values in this interval not displayed.   Cardiac Enzymes: No results for input(s):  CKTOTAL, CKMB, CKMBINDEX, TROPONINI in the last 168 hours. CBG: Recent Labs  Lab 08/02/19 0656 08/02/19 1313 08/02/19 1558 08/02/19 2144 08/03/19 0755  GLUCAP 152* 139* 223* 363* 171*    Iron Studies:  Recent Labs    08/03/19 0406  FERRITIN 232   Studies/Results: No results found.  Medications: Infusions: . sodium chloride    . sodium chloride    . ferric gluconate (FERRLECIT/NULECIT) IV Stopped (08/02/19 1513)  . remdesivir 100 mg in NS 100 mL 100 mg (08/03/19 1027)    Scheduled Medications: . atorvastatin  20 mg Oral q1800  . carvedilol  25 mg Oral BID WC  . Chlorhexidine Gluconate Cloth  6 each Topical Q0600  . Chlorhexidine Gluconate Cloth  6 each Topical Q0600  . dexamethasone  6 mg Oral Daily  . ferrous sulfate  325 mg Oral Daily  . heparin injection (subcutaneous)  5,000 Units Subcutaneous Q8H  . hydrALAZINE  25 mg Oral Q8H  . influenza vac split quadrivalent PF  0.5 mL Intramuscular Once  . insulin aspart  0-6 Units Subcutaneous TID WC  . polyethylene glycol  17 g Oral Daily  . senna-docusate  2 tablet Oral BID    have reviewed scheduled and prn medications.  Physical Exam: From 2/5 General:NAD, comfortable Heart:RRR, s1s2 nl Lungs:clear b/l, no  crackle Abdomen:soft, Non-tender, non-distended Extremities:No edema Dialysis Access: Right IJ TDC.  Patient was not examined today because of COVID-19 infection in order to minimize exposure and to preserve PPE.  The physical examination of primary team reviewed and discussed.  Montoya Brandel Prasad Aamari West 08/03/2019,10:39 AM  LOS: 7 days  Pager: [phone]

## 2019-08-03 NOTE — Discharge Instructions (Signed)
Person Under Monitoring Name: Corey Park  Location: [address] [place] [address]   Infection Prevention Recommendations for Individuals Confirmed to have, or Being Evaluated for, 2019 Novel Coronavirus (COVID-19) Infection Who Receive Care at Home  Individuals who are confirmed to have, or are being evaluated for, COVID-19 should follow the prevention steps below until a healthcare provider or local or state health department says they can return to normal activities.  Stay home except to get medical care You should restrict activities outside your home, except for getting medical care. Do not go to work, school, or public areas, and do not use public transportation or taxis.  Call ahead before visiting your doctor Before your medical appointment, call the healthcare provider and tell them that you have, or are being evaluated for, COVID-19 infection. This will help the healthcare provider's office take steps to keep other people from getting infected. Ask your healthcare provider to call the local or state health department.  Monitor your symptoms Seek prompt medical attention if your illness is worsening (e.g., difficulty breathing). Before going to your medical appointment, call the healthcare provider and tell them that you have, or are being evaluated for, COVID-19 infection. Ask your healthcare provider to call the local or state health department.  Wear a facemask You should wear a facemask that covers your nose and mouth when you are in the same room with other people and when you visit a healthcare provider. People who live with or visit you should also wear a facemask while they are in the same room with you.  Separate yourself from other people in your home As much as possible, you should stay in a different room from other people in your home. Also, you should use a separate bathroom, if available.  Avoid sharing household items You should not  share dishes, drinking glasses, cups, eating utensils, towels, bedding, or other items with other people in your home. After using these items, you should wash them thoroughly with soap and water.  Cover your coughs and sneezes Cover your mouth and nose with a tissue when you cough or sneeze, or you can cough or sneeze into your sleeve. Throw used tissues in a lined trash can, and immediately wash your hands with soap and water for at least 20 seconds or use an alcohol-based hand rub.  Wash your [facility] your hands often and thoroughly with soap and water for at least 20 seconds. You can use an alcohol-based hand sanitizer if soap and water are not available and if your hands are not visibly dirty. Avoid touching your eyes, nose, and mouth with unwashed hands.   Prevention Steps for Caregivers and Household Members of Individuals Confirmed to have, or Being Evaluated for, COVID-19 Infection Being Cared for in the Home  If you live with, or provide care at home for, a person confirmed to have, or being evaluated for, COVID-19 infection please follow these guidelines to prevent infection:  Follow healthcare provider's instructions Make sure that you understand and can help the patient follow any healthcare provider instructions for all care.  Provide for the patient's basic needs You should help the patient with basic needs in the home and provide support for getting groceries, prescriptions, and other personal needs.  Monitor the patient's symptoms If they are getting sicker, call his or her medical provider and tell them that the patient has, or is being evaluated for, COVID-19 infection. This will help the healthcare provider's  office take steps to keep other people from getting infected. Ask the healthcare provider to call the local or state health department.  Limit the number of people who have contact with the patient  If possible, have only one caregiver for the  patient.  Other household members should stay in another home or place of residence. If this is not possible, they should stay  in another room, or be separated from the patient as much as possible. Use a separate bathroom, if available.  Restrict visitors who do not have an essential need to be in the home.  Keep older adults, very young children, and other sick people away from the patient Keep older adults, very young children, and those who have compromised immune systems or chronic health conditions away from the patient. This includes people with chronic heart, lung, or kidney conditions, diabetes, and cancer.  Ensure good ventilation Make sure that shared spaces in the home have good air flow, such as from an air conditioner or an opened window, weather permitting.  Wash your hands often  Wash your hands often and thoroughly with soap and water for at least 20 seconds. You can use an alcohol based hand sanitizer if soap and water are not available and if your hands are not visibly dirty.  Avoid touching your eyes, nose, and mouth with unwashed hands.  Use disposable paper towels to dry your hands. If not available, use dedicated cloth towels and replace them when they become wet.  Wear a facemask and gloves  Wear a disposable facemask at all times in the room and gloves when you touch or have contact with the patient's blood, body fluids, and/or secretions or excretions, such as sweat, saliva, sputum, nasal mucus, vomit, urine, or feces.  Ensure the mask fits over your nose and mouth tightly, and do not touch it during use.  Throw out disposable facemasks and gloves after using them. Do not reuse.  Wash your hands immediately after removing your facemask and gloves.  If your personal clothing becomes contaminated, carefully remove clothing and launder. Wash your hands after handling contaminated clothing.  Place all used disposable facemasks, gloves, and other waste in a lined  container before disposing them with other household waste.  Remove gloves and wash your hands immediately after handling these items.  Do not share dishes, glasses, or other household items with the patient  Avoid sharing household items. You should not share dishes, drinking glasses, cups, eating utensils, towels, bedding, or other items with a patient who is confirmed to have, or being evaluated for, COVID-19 infection.  After the person uses these items, you should wash them thoroughly with soap and water.  Wash laundry thoroughly  Immediately remove and wash clothes or bedding that have blood, body fluids, and/or secretions or excretions, such as sweat, saliva, sputum, nasal mucus, vomit, urine, or feces, on them.  Wear gloves when handling laundry from the patient.  Read and follow directions on labels of laundry or clothing items and detergent. In general, wash and dry with the warmest temperatures recommended on the label.  Clean all areas the individual has used often  Clean all touchable surfaces, such as counters, tabletops, doorknobs, bathroom fixtures, toilets, phones, keyboards, tablets, and bedside tables, every day. Also, clean any surfaces that may have blood, body fluids, and/or secretions or excretions on them.  Wear gloves when cleaning surfaces the patient has come in contact with.  Use a diluted bleach solution (e.g., dilute bleach with 1  part bleach and 10 parts water) or a household disinfectant with a label that says EPA-registered for coronaviruses. To make a bleach solution at home, add 1 tablespoon of bleach to 1 quart (4 cups) of water. For a larger supply, add  cup of bleach to 1 gallon (16 cups) of water.  Read labels of cleaning products and follow recommendations provided on product labels. Labels contain instructions for safe and effective use of the cleaning product including precautions you should take when applying the product, such as wearing gloves or  eye protection and making sure you have good ventilation during use of the product.  Remove gloves and wash hands immediately after cleaning.  Monitor yourself for signs and symptoms of illness Caregivers and household members are considered close contacts, should monitor their health, and will be asked to limit movement outside of the home to the extent possible. Follow the monitoring steps for close contacts listed on the symptom monitoring form.   ? If you have additional questions, contact your local health department or call the epidemiologist on call at [phone] (available 24/7). ? This guidance is subject to change. For the most up-to-date guidance from [facility], please refer to their website: [email or web address]

## 2019-08-03 NOTE — Discharge Summary (Signed)
PATIENT DETAILS Name: Corey Park Age: 49 y.o. Sex: male Date of Birth: 1971/03/18 MRN: [id number]. Admitting Physician: Orene Desanctis, DO [id number], No Pcp Per  Admit Date: 07/27/2019 Discharge date: 08/03/2019  Recommendations for Outpatient Follow-up:  1. Follow up with PCP in 1-2 weeks 2. Please obtain CMP/CBC in one week 3. Repeat Chest Xray in 4-6 week  Admitted From:  Home   Disposition: [place]: Yes  Equipment/Devices: None  Discharge Condition: Stable  CODE STATUS: FULL CODE  Diet recommendation:  Diet Order            Diet - low sodium heart healthy        Diet renal with fluid restriction Fluid restriction: 1200 mL Fluid; Room service appropriate? Yes; Fluid consistency: Thin  Diet effective now               Brief Summary: See H&P, Labs, Consult and Test reports for all details in brief, Patient is a 49 y.o. 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  See below for further details.  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  Volume status much improved following HD.  Plans are to discharge on 2/7-and continue with HD in the outpatient setting-first appointment for outpatient hemodialysis on Tuesday at 13 PM (see note from  renal navigator)  Volume overload/decompensated heart failure (EF 20-25% per TTE on 07/28/2019) in the setting of progression to ESRD: Volume status much improved following HD.  No longer on diuretics.   Moderate pulmonary hypertension: Likely secondary to left-sided heart failure-stable for outpatient follow-up and monitoring.  Acute Hypoxic Resp Failure due to Covid 19 Viral pneumonia: Not hypoxic-treated with remdesivir/steroids-CRP has trended down.    Given stability-does  not require any further steroid therapy on discharge-we will complete remdesivir on 2/7.  COVID-19 Labs:  Recent Labs    08/01/19 0500 08/02/19 1420 08/03/19 0406  DDIMER 1.38* 1.53* 1.42*  FERRITIN 420* 258 232  CRP 4.6* 3.0* 2.3*    Lab Results  Component Value Date   SARSCOV2NAA NEGATIVE 12/21/2018      COVID-19 Medications: Steroids:2/3>>2/7 Remdesivir:2/3>>2/7  HTN: Better controlled-continue Coreg and hydralazine-monitor closely in the outpatient setting.  Hiccups: Seems to have resolved-likely secondary to uremia.  On as needed Reglan.  Anemia: Secondary to CKD-hemoglobin stable-iron and Aranesp-being managed by nephrology.  DM-2 (A1C 6.0 on 07/28/19): CBG stable-on SSI-does not appear to be on any oral hypoglycemics as outpatient.  Defer further to PCP  Legally Blind  Procedures/Studies: None  Discharge Diagnoses:  Principal Problem:   Acute CHF (congestive heart failure) ([place]) Active Problems:   DM (diabetes mellitus), type 2 (HCC)   HLD (hyperlipidemia)   Chronic kidney disease V   Anemia in chronic kidney disease (CKD)   Fe deficiency anemia   COVID-19 virus infection   Discharge Instructions:    Person Under Monitoring Name: Corey Park  Location: [address] [place] [address]   Infection Prevention Recommendations for Individuals Confirmed to have, or Being Evaluated for, 2019 Novel Coronavirus (COVID-19) Infection Who Receive Care at Home  Individuals who are confirmed to have, or are being evaluated for, COVID-19 should follow the prevention steps below until a healthcare provider or local or state health department says they  can return to normal activities.  Stay home except to get medical care You should restrict activities outside your home, except for getting medical care. Do not go to work, school, or public areas, and do not use public transportation or taxis.  Call ahead before visiting your doctor Before  your medical appointment, call the healthcare provider and tell them that you have, or are being evaluated for, COVID-19 infection. This will help the healthcare provider's office take steps to keep other people from getting infected. Ask your healthcare provider to call the local or state health department.  Monitor your symptoms Seek prompt medical attention if your illness is worsening (e.g., difficulty breathing). Before going to your medical appointment, call the healthcare provider and tell them that you have, or are being evaluated for, COVID-19 infection. Ask your healthcare provider to call the local or state health department.  Wear a facemask You should wear a facemask that covers your nose and mouth when you are in the same room with other people and when you visit a healthcare provider. People who live with or visit you should also wear a facemask while they are in the same room with you.  Separate yourself from other people in your home As much as possible, you should stay in a different room from other people in your home. Also, you should use a separate bathroom, if available.  Avoid sharing household items You should not share dishes, drinking glasses, cups, eating utensils, towels, bedding, or other items with other people in your home. After using these items, you should wash them thoroughly with soap and water.  Cover your coughs and sneezes Cover your mouth and nose with a tissue when you cough or sneeze, or you can cough or sneeze into your sleeve. Throw used tissues in a lined trash can, and immediately wash your hands with soap and water for at least 20 seconds or use an alcohol-based hand rub.  Wash your [facility] your hands often and thoroughly with soap and water for at least 20 seconds. You can use an alcohol-based hand sanitizer if soap and water are not available and if your hands are not visibly dirty. Avoid touching your eyes, nose, and mouth with  unwashed hands.   Prevention Steps for Caregivers and Household Members of Individuals Confirmed to have, or Being Evaluated for, COVID-19 Infection Being Cared for in the Home  If you live with, or provide care at home for, a person confirmed to have, or being evaluated for, COVID-19 infection please follow these guidelines to prevent infection:  Follow healthcare provider's instructions Make sure that you understand and can help the patient follow any healthcare provider instructions for all care.  Provide for the patient's basic needs You should help the patient with basic needs in the home and provide support for getting groceries, prescriptions, and other personal needs.  Monitor the patient's symptoms If they are getting sicker, call his or her medical provider and tell them that the patient has, or is being evaluated for, COVID-19 infection. This will help the healthcare provider's office take steps to keep other people from getting infected. Ask the healthcare provider to call the local or state health department.  Limit the number of people who have contact with the patient  If possible, have only one caregiver for the patient.  Other household members should stay in another home or place of residence. If this is not possible, they should stay  in another room, or be separated  from the patient as much as possible. Use a separate bathroom, if available.  Restrict visitors who do not have an essential need to be in the home.  Keep older adults, very young children, and other sick people away from the patient Keep older adults, very young children, and those who have compromised immune systems or chronic health conditions away from the patient. This includes people with chronic heart, lung, or kidney conditions, diabetes, and cancer.  Ensure good ventilation Make sure that shared spaces in the home have good air flow, such as from an air conditioner or an opened  window, weather permitting.  Wash your hands often  Wash your hands often and thoroughly with soap and water for at least 20 seconds. You can use an alcohol based hand sanitizer if soap and water are not available and if your hands are not visibly dirty.  Avoid touching your eyes, nose, and mouth with unwashed hands.  Use disposable paper towels to dry your hands. If not available, use dedicated cloth towels and replace them when they become wet.  Wear a facemask and gloves  Wear a disposable facemask at all times in the room and gloves when you touch or have contact with the patient's blood, body fluids, and/or secretions or excretions, such as sweat, saliva, sputum, nasal mucus, vomit, urine, or feces.  Ensure the mask fits over your nose and mouth tightly, and do not touch it during use.  Throw out disposable facemasks and gloves after using them. Do not reuse.  Wash your hands immediately after removing your facemask and gloves.  If your personal clothing becomes contaminated, carefully remove clothing and launder. Wash your hands after handling contaminated clothing.  Place all used disposable facemasks, gloves, and other waste in a lined container before disposing them with other household waste.  Remove gloves and wash your hands immediately after handling these items.  Do not share dishes, glasses, or other household items with the patient  Avoid sharing household items. You should not share dishes, drinking glasses, cups, eating utensils, towels, bedding, or other items with a patient who is confirmed to have, or being evaluated for, COVID-19 infection.  After the person uses these items, you should wash them thoroughly with soap and water.  Wash laundry thoroughly  Immediately remove and wash clothes or bedding that have blood, body fluids, and/or secretions or excretions, such as sweat, saliva, sputum, nasal mucus, vomit, urine, or feces, on them.  Wear gloves when  handling laundry from the patient.  Read and follow directions on labels of laundry or clothing items and detergent. In general, wash and dry with the warmest temperatures recommended on the label.  Clean all areas the individual has used often  Clean all touchable surfaces, such as counters, tabletops, doorknobs, bathroom fixtures, toilets, phones, keyboards, tablets, and bedside tables, every day. Also, clean any surfaces that may have blood, body fluids, and/or secretions or excretions on them.  Wear gloves when cleaning surfaces the patient has come in contact with.  Use a diluted bleach solution (e.g., dilute bleach with 1 part bleach and 10 parts water) or a household disinfectant with a label that says EPA-registered for coronaviruses. To make a bleach solution at home, add 1 tablespoon of bleach to 1 quart (4 cups) of water. For a larger supply, add  cup of bleach to 1 gallon (16 cups) of water.  Read labels of cleaning products and follow recommendations provided on product labels. Labels contain instructions for safe and  effective use of the cleaning product including precautions you should take when applying the product, such as wearing gloves or eye protection and making sure you have good ventilation during use of the product.  Remove gloves and wash hands immediately after cleaning.  Monitor yourself for signs and symptoms of illness Caregivers and household members are considered close contacts, should monitor their health, and will be asked to limit movement outside of the home to the extent possible. Follow the monitoring steps for close contacts listed on the symptom monitoring form.   ? If you have additional questions, contact your local health department or call the epidemiologist on call at [phone] (available 24/7). ? This guidance is subject to change. For the most up-to-date guidance from [facility], please refer to their  website: YouBlogs.pl    Activity:  As tolerated  Discharge Instructions    Diet - low sodium heart healthy   Complete by: As directed    Discharge instructions   Complete by: As directed    1.)  3 weeks of isolation from 07/27/2019  2.)  Follow with outpatient HD clinic as instructed by nephrology-first appointment this coming Tuesday (instructions given by nephrology)   Follow with Primary MD in 1-2 weeks  Please get a complete blood count and chemistry panel checked by your Primary MD at your next visit, and again as instructed by your Primary MD.  Get Medicines reviewed and adjusted: Please take all your medications with you for your next visit with your Primary MD  Laboratory/radiological data: Please request your Primary MD to go over all hospital tests and procedure/radiological results at the follow up, please ask your Primary MD to get all Hospital records sent to his/her office.  In some cases, they will be blood work, cultures and biopsy results pending at the time of your discharge. Please request that your primary care M.D. follows up on these results.  Also Note the following: If you experience worsening of your admission symptoms, develop shortness of breath, life threatening emergency, suicidal or homicidal thoughts you must seek medical attention immediately by calling 911 or calling your MD immediately  if symptoms less severe.  You must read complete instructions/literature along with all the possible adverse reactions/side effects for all the Medicines you take and that have been prescribed to you. Take any new Medicines after you have completely understood and accpet all the possible adverse reactions/side effects.   Do not drive when taking Pain medications or sleeping medications (Benzodaizepines)  Do not take more than prescribed Pain, Sleep and Anxiety Medications. It is not advisable to combine  anxiety,sleep and pain medications without talking with your primary care practitioner  Special Instructions: If you have smoked or chewed Tobacco  in the last 2 yrs please stop smoking, stop any regular Alcohol  and or any Recreational drug use.  Wear Seat belts while driving.  Please note: You were cared for by a hospitalist during your hospital stay. Once you are discharged, your primary care physician will handle any further medical issues. Please note that NO REFILLS for any discharge medications will be authorized once you are discharged, as it is imperative that you return to your primary care physician (or establish a relationship with a primary care physician if you do not have one) for your post hospital discharge needs so that they can reassess your need for medications and monitor your lab values.   Increase activity slowly   Complete by: As directed  Allergies as of 08/03/2019   No Known Allergies     Medication List    STOP taking these medications   furosemide 80 MG tablet Commonly known as: LASIX   pantoprazole 40 MG tablet Commonly known as: Protonix     TAKE these medications   atorvastatin 20 MG tablet Commonly known as: LIPITOR One pill each evening to lower cholesterol   carvedilol 25 MG tablet Commonly known as: COREG Take 1 tablet (25 mg total) by mouth 2 (two) times daily with a meal.   ferrous sulfate 325 (65 FE) MG tablet Take 1 tablet (325 mg total) by mouth daily.   hydrALAZINE 50 MG tablet Commonly known as: APRESOLINE Take 1 tablet (50 mg total) by mouth 3 (three) times daily.      Follow-up Information    Jerline Pain, MD Follow up on 08/29/2019.   Specialty: Cardiology Why: Please go to hospital follow-up March 5th at 8:20 AM Contact information: [address] [place] [address] [phone]          No Known Allergies  Consultations:   nephrology   Other Procedures/Studies: DG Chest 2  View  Result Date: 07/27/2019 CLINICAL DATA:  Leg swelling and constipation.  Fever. EXAM: CHEST - 2 VIEW COMPARISON:  December 21, 2018 FINDINGS: Cardiomegaly. The hila and mediastinum are normal. No pneumothorax. Mild opacity in the right base is somewhat platelike in appearance. No other infiltrates are noted. No overt edema. IMPRESSION: Platelike opacity in the right base may represent atelectasis but infiltrate such as pneumonia is not excluded. Cardiomegaly. Electronically Signed   By: Dorise Bullion III M.D   On: 07/27/2019 18:57   IR Fluoro Guide CV Line Right  Result Date: 07/29/2019 INDICATION: 49 year old with acute on chronic renal failure with uremia. EXAM: FLUOROSCOPIC AND ULTRASOUND GUIDED PLACEMENT OF A TUNNELED DIALYSIS CATHETER Physician: Stephan Minister. Anselm Pancoast, MD MEDICATIONS: Ancef 2 g; The antibiotic was administered within an appropriate time interval prior to skin puncture. ANESTHESIA/SEDATION: Versed 1.5 mg IV; Fentanyl 75 mcg IV; Moderate Sedation Time:  18 minutes The patient was continuously monitored during the procedure by the interventional radiology nurse under my direct supervision. FLUOROSCOPY TIME:  Fluoroscopy Time: 18 seconds, 2 mGy COMPLICATIONS: None immediate. PROCEDURE: The procedure was explained to the patient. The risks and benefits of the procedure were discussed and the patient's questions were addressed. Informed consent was obtained from the patient. The patient was placed supine on the interventional table. Ultrasound confirmed a patent right internal jugular vein. Ultrasound images were obtained for documentation. The right neck and chest was prepped and draped in a sterile fashion. The right neck was anesthetized with 1% lidocaine. Maximal barrier sterile technique was utilized including caps, mask, sterile gowns, sterile gloves, sterile drape, hand hygiene and skin antiseptic. A small incision was made with #11 blade scalpel. A 21 gauge needle directed into the right  internal jugular vein with ultrasound guidance. A micropuncture dilator set was placed. A 23 cm tip to cuff Palindrome catheter was selected. The skin below the right clavicle was anesthetized and a small incision was made with an #11 blade scalpel. A subcutaneous tunnel was formed to the vein dermatotomy site. The catheter was brought through the tunnel. The vein dermatotomy site was dilated to accommodate a peel-away sheath. The catheter was placed through the peel-away sheath and directed into the central venous structures. The tip of the catheter was placed in the right atrium with fluoroscopy. Fluoroscopic images were obtained  for documentation. Both lumens were found to aspirate and flush well. The proper amount of heparin was flushed in both lumens. The vein dermatotomy site was closed using a single layer of absorbable suture and Dermabond. Gel-Foam placed in subcutaneous tract. The catheter was secured to the skin using Prolene suture. IMPRESSION: Successful placement of a right jugular tunneled dialysis catheter using ultrasound and fluoroscopic guidance. Electronically Signed   By: Markus Daft M.D.   On: 07/29/2019 15:20   IR US Guide Vasc Access Right  Result Date: 07/29/2019 INDICATION: 49 year old with acute on chronic renal failure with uremia. EXAM: FLUOROSCOPIC AND ULTRASOUND GUIDED PLACEMENT OF A TUNNELED DIALYSIS CATHETER Physician: Stephan Minister. Anselm Pancoast, MD MEDICATIONS: Ancef 2 g; The antibiotic was administered within an appropriate time interval prior to skin puncture. ANESTHESIA/SEDATION: Versed 1.5 mg IV; Fentanyl 75 mcg IV; Moderate Sedation Time:  18 minutes The patient was continuously monitored during the procedure by the interventional radiology nurse under my direct supervision. FLUOROSCOPY TIME:  Fluoroscopy Time: 18 seconds, 2 mGy COMPLICATIONS: None immediate. PROCEDURE: The procedure was explained to the patient. The risks and benefits of the procedure were discussed and the patient's  questions were addressed. Informed consent was obtained from the patient. The patient was placed supine on the interventional table. Ultrasound confirmed a patent right internal jugular vein. Ultrasound images were obtained for documentation. The right neck and chest was prepped and draped in a sterile fashion. The right neck was anesthetized with 1% lidocaine. Maximal barrier sterile technique was utilized including caps, mask, sterile gowns, sterile gloves, sterile drape, hand hygiene and skin antiseptic. A small incision was made with #11 blade scalpel. A 21 gauge needle directed into the right internal jugular vein with ultrasound guidance. A micropuncture dilator set was placed. A 23 cm tip to cuff Palindrome catheter was selected. The skin below the right clavicle was anesthetized and a small incision was made with an #11 blade scalpel. A subcutaneous tunnel was formed to the vein dermatotomy site. The catheter was brought through the tunnel. The vein dermatotomy site was dilated to accommodate a peel-away sheath. The catheter was placed through the peel-away sheath and directed into the central venous structures. The tip of the catheter was placed in the right atrium with fluoroscopy. Fluoroscopic images were obtained for documentation. Both lumens were found to aspirate and flush well. The proper amount of heparin was flushed in both lumens. The vein dermatotomy site was closed using a single layer of absorbable suture and Dermabond. Gel-Foam placed in subcutaneous tract. The catheter was secured to the skin using Prolene suture. IMPRESSION: Successful placement of a right jugular tunneled dialysis catheter using ultrasound and fluoroscopic guidance. Electronically Signed   By: Markus Daft M.D.   On: 07/29/2019 15:20   ECHOCARDIOGRAM LIMITED  Result Date: 07/28/2019   ECHOCARDIOGRAM LIMITED REPORT   Patient Name:   Corey Park Date of Exam: 07/28/2019 Medical Rec #:  [id number]       Height:       74.0 in  Accession #:    [id number]      Weight:       240.3 lb Date of Birth:  1971-01-08       BSA:          2.35 m Patient Age:    39 years        BP:           120/86 mmHg Patient Gender: M  HR:           76 bpm. Exam Location:  Inpatient  Procedure: Limited Echo, Cardiac Doppler and Color Doppler Indications:    CHF  History:        Patient has prior history of Echocardiogram examinations, most                 recent 06/17/2018. Covid positive, CKD.  Sonographer:    Dustin Flock Referring Phys: [id number] [place]  1. Left ventricular ejection fraction, by visual estimation, is 20 to 25%. The left ventricle has severely decreased function. There is severely increased left ventricular wall thickness.  2. Elevated left atrial pressure.  3. Left ventricular diastolic parameters are consistent with Grade II diastolic dysfunction (pseudonormalization).  4. Mildly dilated left ventricular internal cavity size.  5. The left ventricle demonstrates global hypokinesis.  6. Global right ventricle has severely reduced systolic function.The right ventricular size is normal.  7. Trivial pericardial effusion is present.  8. The mitral valve is normal in structure. Trivial mitral valve regurgitation. No evidence of mitral stenosis.  9. The tricuspid valve was normal in structure. Tricuspid valve regurgitation is mild. 10. Mild aortic valve sclerosis without stenosis. 11. The pulmonic valve was normal in structure. Pulmonic valve regurgitation is mild by color flow Doppler. 12. Aortic dilatation noted. 13. There is mild dilatation of the aortic root measuring 39 mm. 14. Moderately elevated pulmonary artery systolic pressure. 15. The inferior vena cava is dilated in size with <50% respiratory variability, suggesting right atrial pressure of 15 mmHg. 16. Severe global reduction in LV systolic function; severe LVH; mild LVE; severe RV dysfunction; mild TR; moderate pulmonary hypertension. FINDINGS  Left  Ventricle: Left ventricular ejection fraction, by visual estimation, is 20 to 25%. The left ventricle has severely decreased function. The left ventricle demonstrates global hypokinesis. The left ventricular internal cavity size was mildly dilated left ventricle. There is severely increased left ventricular wall thickness. Left ventricular diastolic parameters are consistent with Grade II diastolic dysfunction (pseudonormalization). Elevated left atrial pressure. Right Ventricle: The right ventricular size is normal.Global RV systolic function is has severely reduced systolic function. The tricuspid regurgitant velocity is 2.94 m/s, and with an assumed right atrial pressure of 8 mmHg, the estimated right ventricular systolic pressure is moderately elevated at 42.5 mmHg. Left Atrium: Left atrial size was normal in size. Right Atrium: Right atrial size was normal in size. Right atrial pressure is estimated at 8 mmHg. Pericardium: Trivial pericardial effusion is present is seen. Trivial pericardial effusion is present. Mitral Valve: The mitral valve is normal in structure. There is mild thickening of the mitral valve leaflet(s). No evidence of mitral valve stenosis by observation. MV Area by PHT, 4.49 cm. MV PHT, 49.01 msec. Trivial mitral valve regurgitation. Tricuspid Valve: The tricuspid valve is normal in structure. Tricuspid valve regurgitation is mild. Aortic Valve: The aortic valve is tricuspid. Aortic valve regurgitation is not visualized. Mild aortic valve sclerosis is present, with no evidence of aortic valve stenosis. Pulmonic Valve: The pulmonic valve was normal in structure. Pulmonic valve regurgitation is mild by color flow Doppler. Pulmonic regurgitation is mild by color flow Doppler. Aorta: Aortic dilatation noted. There is mild dilatation of the aortic root measuring 39 mm. Venous: The inferior vena cava is dilated in size with less than 50% respiratory variability, suggesting right atrial pressure of  15 mmHg. Shunts: There is no evidence of a patent foramen ovale.  Additional Comments: Severe global reduction in LV  systolic function; severe LVH; mild LVE; severe RV dysfunction; mild TR; moderate pulmonary hypertension.  LEFT VENTRICLE          Normals PLAX 2D LVIDd:         5.92 cm  3.6 cm   Diastology                 Normals LVIDs:         4.48 cm  1.7 cm   LV e' lateral:   4.70 cm/s 6.42 cm/s LV PW:         1.53 cm  1.4 cm   LV E/e' lateral: 12.4      15.4 LV IVS:        1.72 cm  1.3 cm   LV e' medial:    3.24 cm/s 6.96 cm/s LVOT diam:     2.20 cm  2.0 cm   LV E/e' medial:  18.0      6.96 LV SV:         83 ml    79 ml LV SV Index:   34.46    45 ml/m2 LVOT Area:     3.80 cm 3.14 cm2  LEFT ATRIUM         Index LA diam:    3.90 cm 1.66 cm/m  AORTIC VALVE             Normals LVOT Vmax:   79.60 cm/s LVOT Vmean:  48.000 cm/s 75 cm/s LVOT VTI:    0.130 m     25.3 cm  AORTA                 Normals Ao Root diam: 3.90 cm 31 mm MITRAL VALVE              Normals   TRICUSPID VALVE             Normals MV Area (PHT): 4.49 cm             TR Peak grad:   34.5 mmHg MV PHT:        49.01 msec 55 ms     TR Vmax:        330.00 cm/s 288 cm/s MV Decel Time: 169 msec   187 ms MV E velocity: 58.30 cm/s 103 cm/s  SHUNTS MV A velocity: 38.10 cm/s 70.3 cm/s Systemic VTI:  0.13 m MV E/A ratio:  1.53       1.5       Systemic Diam: 2.20 cm  Kirk Ruths MD Electronically signed by Kirk Ruths MD Signature Date/Time: 07/28/2019/2:52:57 PMThe mitral valve is normal in structure.    Final      TODAY-DAY OF DISCHARGE:  Subjective:   Corey Park today has no headache,no chest abdominal pain,no new weakness tingling or numbness, feels much better wants to go home today.   Objective:   Blood pressure (!) 142/104, pulse 73, temperature 97.8 F (36.6 C), temperature source Oral, resp. rate 14, height 6\' 2"  (1.88 m), weight 101.5 kg, SpO2 99 %.  Intake/Output Summary (Last 24 hours) at 08/03/2019 1017 Last data filed at  08/02/2019 1900 Gross per 24 hour  Intake 550.17 ml  Output 2000 ml  Net -1449.83 ml   Filed Weights   08/02/19 1225 08/03/19 0500 08/03/19 0701  Weight: 102 kg 101.8 kg 101.5 kg    Exam: Awake Alert, Oriented *3, No new F.N deficits, Normal affect .AT,PERRAL Supple Neck,No JVD, No cervical lymphadenopathy appriciated.  Symmetrical Chest wall  movement, Good air movement bilaterally, CTAB RRR,No Gallops,Rubs or new Murmurs, No Parasternal Heave +ve B.Sounds, Abd Soft, Non tender, No organomegaly appriciated, No rebound -guarding or rigidity. No Cyanosis, Clubbing or edema, No new Rash or bruise   PERTINENT RADIOLOGIC STUDIES: DG Chest 2 View  Result Date: 07/27/2019 CLINICAL DATA:  Leg swelling and constipation.  Fever. EXAM: CHEST - 2 VIEW COMPARISON:  December 21, 2018 FINDINGS: Cardiomegaly. The hila and mediastinum are normal. No pneumothorax. Mild opacity in the right base is somewhat platelike in appearance. No other infiltrates are noted. No overt edema. IMPRESSION: Platelike opacity in the right base may represent atelectasis but infiltrate such as pneumonia is not excluded. Cardiomegaly. Electronically Signed   By: Dorise Bullion III M.D   On: 07/27/2019 18:57   IR Fluoro Guide CV Line Right  Result Date: 07/29/2019 INDICATION: 49 year old with acute on chronic renal failure with uremia. EXAM: FLUOROSCOPIC AND ULTRASOUND GUIDED PLACEMENT OF A TUNNELED DIALYSIS CATHETER Physician: Stephan Minister. Anselm Pancoast, MD MEDICATIONS: Ancef 2 g; The antibiotic was administered within an appropriate time interval prior to skin puncture. ANESTHESIA/SEDATION: Versed 1.5 mg IV; Fentanyl 75 mcg IV; Moderate Sedation Time:  18 minutes The patient was continuously monitored during the procedure by the interventional radiology nurse under my direct supervision. FLUOROSCOPY TIME:  Fluoroscopy Time: 18 seconds, 2 mGy COMPLICATIONS: None immediate. PROCEDURE: The procedure was explained to the patient. The risks and  benefits of the procedure were discussed and the patient's questions were addressed. Informed consent was obtained from the patient. The patient was placed supine on the interventional table. Ultrasound confirmed a patent right internal jugular vein. Ultrasound images were obtained for documentation. The right neck and chest was prepped and draped in a sterile fashion. The right neck was anesthetized with 1% lidocaine. Maximal barrier sterile technique was utilized including caps, mask, sterile gowns, sterile gloves, sterile drape, hand hygiene and skin antiseptic. A small incision was made with #11 blade scalpel. A 21 gauge needle directed into the right internal jugular vein with ultrasound guidance. A micropuncture dilator set was placed. A 23 cm tip to cuff Palindrome catheter was selected. The skin below the right clavicle was anesthetized and a small incision was made with an #11 blade scalpel. A subcutaneous tunnel was formed to the vein dermatotomy site. The catheter was brought through the tunnel. The vein dermatotomy site was dilated to accommodate a peel-away sheath. The catheter was placed through the peel-away sheath and directed into the central venous structures. The tip of the catheter was placed in the right atrium with fluoroscopy. Fluoroscopic images were obtained for documentation. Both lumens were found to aspirate and flush well. The proper amount of heparin was flushed in both lumens. The vein dermatotomy site was closed using a single layer of absorbable suture and Dermabond. Gel-Foam placed in subcutaneous tract. The catheter was secured to the skin using Prolene suture. IMPRESSION: Successful placement of a right jugular tunneled dialysis catheter using ultrasound and fluoroscopic guidance. Electronically Signed   By: Markus Daft M.D.   On: 07/29/2019 15:20   IR US Guide Vasc Access Right  Result Date: 07/29/2019 INDICATION: 49 year old with acute on chronic renal failure with uremia.  EXAM: FLUOROSCOPIC AND ULTRASOUND GUIDED PLACEMENT OF A TUNNELED DIALYSIS CATHETER Physician: Stephan Minister. Anselm Pancoast, MD MEDICATIONS: Ancef 2 g; The antibiotic was administered within an appropriate time interval prior to skin puncture. ANESTHESIA/SEDATION: Versed 1.5 mg IV; Fentanyl 75 mcg IV; Moderate Sedation Time:  18 minutes The patient was continuously  monitored during the procedure by the interventional radiology nurse under my direct supervision. FLUOROSCOPY TIME:  Fluoroscopy Time: 18 seconds, 2 mGy COMPLICATIONS: None immediate. PROCEDURE: The procedure was explained to the patient. The risks and benefits of the procedure were discussed and the patient's questions were addressed. Informed consent was obtained from the patient. The patient was placed supine on the interventional table. Ultrasound confirmed a patent right internal jugular vein. Ultrasound images were obtained for documentation. The right neck and chest was prepped and draped in a sterile fashion. The right neck was anesthetized with 1% lidocaine. Maximal barrier sterile technique was utilized including caps, mask, sterile gowns, sterile gloves, sterile drape, hand hygiene and skin antiseptic. A small incision was made with #11 blade scalpel. A 21 gauge needle directed into the right internal jugular vein with ultrasound guidance. A micropuncture dilator set was placed. A 23 cm tip to cuff Palindrome catheter was selected. The skin below the right clavicle was anesthetized and a small incision was made with an #11 blade scalpel. A subcutaneous tunnel was formed to the vein dermatotomy site. The catheter was brought through the tunnel. The vein dermatotomy site was dilated to accommodate a peel-away sheath. The catheter was placed through the peel-away sheath and directed into the central venous structures. The tip of the catheter was placed in the right atrium with fluoroscopy. Fluoroscopic images were obtained for documentation. Both lumens were found  to aspirate and flush well. The proper amount of heparin was flushed in both lumens. The vein dermatotomy site was closed using a single layer of absorbable suture and Dermabond. Gel-Foam placed in subcutaneous tract. The catheter was secured to the skin using Prolene suture. IMPRESSION: Successful placement of a right jugular tunneled dialysis catheter using ultrasound and fluoroscopic guidance. Electronically Signed   By: Markus Daft M.D.   On: 07/29/2019 15:20   ECHOCARDIOGRAM LIMITED  Result Date: 07/28/2019   ECHOCARDIOGRAM LIMITED REPORT   Patient Name:   Corey Park Date of Exam: 07/28/2019 Medical Rec #:  [id number]       Height:       74.0 in Accession #:    [id number]      Weight:       240.3 lb Date of Birth:  1970/09/20       BSA:          2.35 m Patient Age:    28 years        BP:           120/86 mmHg Patient Gender: M               HR:           76 bpm. Exam Location:  Inpatient  Procedure: Limited Echo, Cardiac Doppler and Color Doppler Indications:    CHF  History:        Patient has prior history of Echocardiogram examinations, most                 recent 06/17/2018. Covid positive, CKD.  Sonographer:    Dustin Flock Referring Phys: [id number] [place]  1. Left ventricular ejection fraction, by visual estimation, is 20 to 25%. The left ventricle has severely decreased function. There is severely increased left ventricular wall thickness.  2. Elevated left atrial pressure.  3. Left ventricular diastolic parameters are consistent with Grade II diastolic dysfunction (pseudonormalization).  4. Mildly dilated left ventricular internal cavity size.  5. The left ventricle demonstrates global hypokinesis.  6.  Global right ventricle has severely reduced systolic function.The right ventricular size is normal.  7. Trivial pericardial effusion is present.  8. The mitral valve is normal in structure. Trivial mitral valve regurgitation. No evidence of mitral stenosis.  9. The tricuspid valve  was normal in structure. Tricuspid valve regurgitation is mild. 10. Mild aortic valve sclerosis without stenosis. 11. The pulmonic valve was normal in structure. Pulmonic valve regurgitation is mild by color flow Doppler. 12. Aortic dilatation noted. 13. There is mild dilatation of the aortic root measuring 39 mm. 14. Moderately elevated pulmonary artery systolic pressure. 15. The inferior vena cava is dilated in size with <50% respiratory variability, suggesting right atrial pressure of 15 mmHg. 16. Severe global reduction in LV systolic function; severe LVH; mild LVE; severe RV dysfunction; mild TR; moderate pulmonary hypertension. FINDINGS  Left Ventricle: Left ventricular ejection fraction, by visual estimation, is 20 to 25%. The left ventricle has severely decreased function. The left ventricle demonstrates global hypokinesis. The left ventricular internal cavity size was mildly dilated left ventricle. There is severely increased left ventricular wall thickness. Left ventricular diastolic parameters are consistent with Grade II diastolic dysfunction (pseudonormalization). Elevated left atrial pressure. Right Ventricle: The right ventricular size is normal.Global RV systolic function is has severely reduced systolic function. The tricuspid regurgitant velocity is 2.94 m/s, and with an assumed right atrial pressure of 8 mmHg, the estimated right ventricular systolic pressure is moderately elevated at 42.5 mmHg. Left Atrium: Left atrial size was normal in size. Right Atrium: Right atrial size was normal in size. Right atrial pressure is estimated at 8 mmHg. Pericardium: Trivial pericardial effusion is present is seen. Trivial pericardial effusion is present. Mitral Valve: The mitral valve is normal in structure. There is mild thickening of the mitral valve leaflet(s). No evidence of mitral valve stenosis by observation. MV Area by PHT, 4.49 cm. MV PHT, 49.01 msec. Trivial mitral valve regurgitation. Tricuspid  Valve: The tricuspid valve is normal in structure. Tricuspid valve regurgitation is mild. Aortic Valve: The aortic valve is tricuspid. Aortic valve regurgitation is not visualized. Mild aortic valve sclerosis is present, with no evidence of aortic valve stenosis. Pulmonic Valve: The pulmonic valve was normal in structure. Pulmonic valve regurgitation is mild by color flow Doppler. Pulmonic regurgitation is mild by color flow Doppler. Aorta: Aortic dilatation noted. There is mild dilatation of the aortic root measuring 39 mm. Venous: The inferior vena cava is dilated in size with less than 50% respiratory variability, suggesting right atrial pressure of 15 mmHg. Shunts: There is no evidence of a patent foramen ovale.  Additional Comments: Severe global reduction in LV systolic function; severe LVH; mild LVE; severe RV dysfunction; mild TR; moderate pulmonary hypertension.  LEFT VENTRICLE          Normals PLAX 2D LVIDd:         5.92 cm  3.6 cm   Diastology                 Normals LVIDs:         4.48 cm  1.7 cm   LV e' lateral:   4.70 cm/s 6.42 cm/s LV PW:         1.53 cm  1.4 cm   LV E/e' lateral: 12.4      15.4 LV IVS:        1.72 cm  1.3 cm   LV e' medial:    3.24 cm/s 6.96 cm/s LVOT diam:     2.20 cm  2.0 cm   LV E/e' medial:  18.0      6.96 LV SV:         83 ml    79 ml LV SV Index:   34.46    45 ml/m2 LVOT Area:     3.80 cm 3.14 cm2  LEFT ATRIUM         Index LA diam:    3.90 cm 1.66 cm/m  AORTIC VALVE             Normals LVOT Vmax:   79.60 cm/s LVOT Vmean:  48.000 cm/s 75 cm/s LVOT VTI:    0.130 m     25.3 cm  AORTA                 Normals Ao Root diam: 3.90 cm 31 mm MITRAL VALVE              Normals   TRICUSPID VALVE             Normals MV Area (PHT): 4.49 cm             TR Peak grad:   34.5 mmHg MV PHT:        49.01 msec 55 ms     TR Vmax:        330.00 cm/s 288 cm/s MV Decel Time: 169 msec   187 ms MV E velocity: 58.30 cm/s 103 cm/s  SHUNTS MV A velocity: 38.10 cm/s 70.3 cm/s Systemic VTI:  0.13 m MV E/A  ratio:  1.53       1.5       Systemic Diam: 2.20 cm  Kirk Ruths MD Electronically signed by Kirk Ruths MD Signature Date/Time: 07/28/2019/2:52:57 PMThe mitral valve is normal in structure.    Final      PERTINENT LAB RESULTS: CBC: Recent Labs    08/02/19 1151 08/03/19 0406  WBC 7.5 7.4  HGB 8.4* 8.6*  HCT 26.5* 26.6*  PLT 155 185   CMET CMP     Component Value Date/Time   NA 134 (L) 08/03/2019 0406   NA 141 01/16/2019 1517   K 3.7 08/03/2019 0406   CL 100 08/03/2019 0406   CO2 26 08/03/2019 0406   GLUCOSE 266 (H) 08/03/2019 0406   BUN 32 (H) 08/03/2019 0406   BUN 62 (H) 01/16/2019 1517   CREATININE 4.85 (H) 08/03/2019 0406   CALCIUM 6.6 (L) 08/03/2019 0406   PROT 5.4 (L) 08/03/2019 0406   ALBUMIN 2.1 (L) 08/03/2019 0406   AST 36 08/03/2019 0406   ALT 21 08/03/2019 0406   ALKPHOS 69 08/03/2019 0406   BILITOT 0.5 08/03/2019 0406   GFRNONAA 13 (L) 08/03/2019 0406   GFRAA 15 (L) 08/03/2019 0406    GFR Estimated Creatinine Clearance: 23.7 mL/min (A) (by C-G formula based on SCr of 4.85 mg/dL (H)). No results for input(s): LIPASE, AMYLASE in the last 72 hours. No results for input(s): CKTOTAL, CKMB, CKMBINDEX, TROPONINI in the last 72 hours. Invalid input(s): POCBNP Recent Labs    08/02/19 1420 08/03/19 0406  DDIMER 1.53* 1.42*   No results for input(s): HGBA1C in the last 72 hours. No results for input(s): CHOL, HDL, LDLCALC, TRIG, CHOLHDL, LDLDIRECT in the last 72 hours. No results for input(s): TSH, T4TOTAL, T3FREE, THYROIDAB in the last 72 hours.  Invalid input(s): FREET3 Recent Labs    08/02/19 1420 08/03/19 0406  FERRITIN 258 232   Coags: No results for input(s): INR in the last 72 hours.  Invalid input(s): PT Microbiology: Recent Results (from the past 240 hour(s))  Blood culture (routine x 2)     Status: None   Collection Time: 07/27/19  7:30 PM   Specimen: BLOOD  Result Value Ref Range Status   Specimen Description BLOOD RIGHT  ANTECUBITAL  Final   Special Requests   Final    BOTTLES DRAWN AEROBIC AND ANAEROBIC Blood Culture results may not be optimal due to an inadequate volume of blood received in culture bottles   Culture   Final    NO GROWTH 5 DAYS Performed at [facility], [place] [address], [place], [place] [address]    Report Status 08/01/2019 FINAL  Final  Blood culture (routine x 2)     Status: None   Collection Time: 07/27/19  7:57 PM   Specimen: BLOOD RIGHT HAND  Result Value Ref Range Status   Specimen Description BLOOD RIGHT HAND  Final   Special Requests   Final    BOTTLES DRAWN AEROBIC ONLY Blood Culture results may not be optimal due to an inadequate volume of blood received in culture bottles   Culture   Final    NO GROWTH 5 DAYS Performed at [facility], [place] [address], [place], [place] [address]    Report Status 08/01/2019 FINAL  Final    FURTHER DISCHARGE INSTRUCTIONS:  Get Medicines reviewed and adjusted: Please take all your medications with you for your next visit with your Primary MD  Laboratory/radiological data: Please request your Primary MD to go over all hospital tests and procedure/radiological results at the follow up, please ask your Primary MD to get all Hospital records sent to his/her office.  In some cases, they will be blood work, cultures and biopsy results pending at the time of your discharge. Please request that your primary care M.D. goes through all the records of your hospital data and follows up on these results.  Also Note the following: If you experience worsening of your admission symptoms, develop shortness of breath, life threatening emergency, suicidal or homicidal thoughts you must seek medical attention immediately by calling 911 or calling your MD immediately  if symptoms less severe.  You must read complete instructions/literature along with all the possible adverse reactions/side effects for all the Medicines you take and that have been  prescribed to you. Take any new Medicines after you have completely understood and accpet all the possible adverse reactions/side effects.   Do not drive when taking Pain medications or sleeping medications (Benzodaizepines)  Do not take more than prescribed Pain, Sleep and Anxiety Medications. It is not advisable to combine anxiety,sleep and pain medications without talking with your primary care practitioner  Special Instructions: If you have smoked or chewed Tobacco  in the last 2 yrs please stop smoking, stop any regular Alcohol  and or any Recreational drug use.  Wear Seat belts while driving.  Please note: You were cared for by a hospitalist during your hospital stay. Once you are discharged, your primary care physician will handle any further medical issues. Please note that NO REFILLS for any discharge medications will be authorized once you are discharged, as it is imperative that you return to your primary care physician (or establish a relationship with a primary care physician if you do not have one) for your post hospital discharge needs so that they can reassess your need for medications and monitor your lab values.  Total Time spent coordinating discharge including counseling, education and face to  face time equals 35 minutes.  SignedOren Binet 08/03/2019 10:17 AM

## 2019-08-03 NOTE — Plan of Care (Signed)
  Problem: Education: Goal: Knowledge of disease and its progression will improve Outcome: Adequate for Discharge Goal: Individualized Educational Video(s) Outcome: Adequate for Discharge   Problem: Fluid Volume: Goal: Compliance with measures to maintain balanced fluid volume will improve Outcome: Adequate for Discharge   Problem: Health Behavior/Discharge Planning: Goal: Ability to manage health-related needs will improve Outcome: Adequate for Discharge   Problem: Nutritional: Goal: Ability to make healthy dietary choices will improve Outcome: Adequate for Discharge   Problem: Clinical Measurements: Goal: Complications related to the disease process, condition or treatment will be avoided or minimized Outcome: Adequate for Discharge   Problem: Health Behavior/Discharge Planning: Goal: Ability to manage health-related needs will improve Outcome: Adequate for Discharge

## 2019-08-06 ENCOUNTER — Telehealth: Payer: Self-pay

## 2019-08-06 ENCOUNTER — Ambulatory Visit: Payer: Medicaid Other | Admitting: Nurse Practitioner

## 2019-08-06 ENCOUNTER — Other Ambulatory Visit: Payer: Self-pay

## 2019-08-06 NOTE — Telephone Encounter (Signed)
Called again at 9:08am. No answer. Voicemail was full.

## 2019-08-06 NOTE — Telephone Encounter (Signed)
Called to attempt TELE visit for patient scheduled for today at 9:00am. No answer, voicemail was full, unable to leave a message.

## 2019-08-08 ENCOUNTER — Encounter: Payer: Self-pay | Admitting: Family Medicine

## 2019-08-11 ENCOUNTER — Other Ambulatory Visit: Payer: Self-pay

## 2019-08-11 ENCOUNTER — Telehealth (INDEPENDENT_AMBULATORY_CARE_PROVIDER_SITE_OTHER): Payer: Medicaid Other | Admitting: Cardiology

## 2019-08-11 ENCOUNTER — Encounter: Payer: Self-pay | Admitting: Cardiology

## 2019-08-11 VITALS — Ht 73.0 in | Wt 218.0 lb

## 2019-08-11 DIAGNOSIS — I420 Dilated cardiomyopathy: Secondary | ICD-10-CM

## 2019-08-11 DIAGNOSIS — R066 Hiccough: Secondary | ICD-10-CM

## 2019-08-11 DIAGNOSIS — I5022 Chronic systolic (congestive) heart failure: Secondary | ICD-10-CM | POA: Diagnosis not present

## 2019-08-11 DIAGNOSIS — N185 Chronic kidney disease, stage 5: Secondary | ICD-10-CM

## 2019-08-11 NOTE — Progress Notes (Signed)
Virtual Visit via Telephone Note   This visit type was conducted due to national recommendations for restrictions regarding the COVID-19 Pandemic (e.g. social distancing) in an effort to limit this patient's exposure and mitigate transmission in our community.  Due to his co-morbid illnesses, this patient is at least at moderate risk for complications without adequate follow up.  This format is felt to be most appropriate for this patient at this time.  The patient did not have access to video technology/had technical difficulties with video requiring transitioning to audio format only (telephone).  All issues noted in this document were discussed and addressed.  No physical exam could be performed with this format.  Please refer to the patient's chart for his  consent to telehealth for [facility].   Date:  08/11/2019   ID:  Corey Park, DOB Mar 14, 1971, MRN [id number]  Patient Location: Home Provider Location: Home  PCP:  Patient, No Pcp Per  Cardiologist:  Candee Furbish, MD  Electrophysiologist:  None   Evaluation Performed:  Follow-Up Visit  Chief Complaint: Follow-up hospitalization Covid infection new start hemodialysis chronic systolic heart failure hypertensive cardiomyopathy  History of Present Illness:    Corey Park is a 49 y.o. male with recent COVID-69 hospitalization discharged on 08/03/2019 with heart failure, end-stage renal disease on hemodialysis on carvedilol 25 twice daily.  Hydralazine.  Blood pressure had been an issue.  We were going to consider adding Isordil 10 mg 3 times daily with hydralazine or isosorbide 30 mg a day if he could tolerate that from a blood pressure perspective.  Has a history of blindness as well.  Started hemodialysis during his Covid admission. Had very challenging hypertension  EF 20 to 25%.  Moderate secondary pulmonary hypertension from left-sided heart failure.  Troponin was 300, high-sensitivity trend was flat not consistent with  ACS.  Had hiccups secondary to uremia.  Stable hemoglobin A1c of 6.0 during hospitalization.  The patient does not have symptoms concerning for COVID-19 infection (fever, chills, cough, or new shortness of breath).    Past Medical History:  Diagnosis Date  . Asthma   . Diabetes mellitus without complication ([place])   . Hypertension   . Renal disorder    Past Surgical History:  Procedure Laterality Date  . BIOPSY  12/22/2018   Procedure: BIOPSY;  Surgeon: Laurence Spates, MD;  Location: [facility];  Service: Endoscopy;;  . ESOPHAGOGASTRODUODENOSCOPY N/A 12/22/2018   Procedure: ESOPHAGOGASTRODUODENOSCOPY (EGD);  Surgeon: Laurence Spates, MD;  Location: [facility] ENDOSCOPY;  Service: Endoscopy;  Laterality: N/A;  . IR FLUORO GUIDE CV LINE RIGHT  07/29/2019  . IR US GUIDE VASC ACCESS RIGHT  07/29/2019     Current Meds  Medication Sig  . atorvastatin (LIPITOR) 20 MG tablet One pill each evening to lower cholesterol  . carvedilol (COREG) 25 MG tablet Take 1 tablet (25 mg total) by mouth 2 (two) times daily with a meal.  . ferrous sulfate 325 (65 FE) MG tablet Take 1 tablet (325 mg total) by mouth daily.  . hydrALAZINE (APRESOLINE) 50 MG tablet Take 1 tablet (50 mg total) by mouth 3 (three) times daily.     Allergies:   Patient has no known allergies.   Social History   Tobacco Use  . Smoking status: Never Smoker  . Smokeless tobacco: Never Used  Substance Use Topics  . Alcohol use: Never    Comment: denies  . Drug use: Never    Types: Marijuana    Comment: denies  Family Hx: The patient's family history includes CAD in his mother; Hypertension in his mother. There is no history of Diabetes, Stroke, Cancer, or Kidney failure.  ROS:   Please see the history of present illness.    No syncope bleeding orthopnea PND All other systems reviewed and are negative.   Prior CV studies:   The following studies were reviewed today:  EF 20 to 25%, moderate secondary pulmonary hypertension   Labs/Other Tests and Data Reviewed:    EKG:  An ECG dated 07/27/2019 was personally reviewed today and demonstrated:  Sinus rhythm with T wave inversions noted especially in the lateral precordial leads  Recent Labs: 07/27/2019: B Natriuretic Peptide 2,665.5 08/01/2019: Magnesium 1.5 08/03/2019: ALT 21; BUN 32; Creatinine, Ser 4.85; Hemoglobin 8.6; Platelets 185; Potassium 3.7; Sodium 134   Recent Lipid Panel Lab Results  Component Value Date/Time   CHOL 167 01/16/2019 03:17 PM   TRIG 83 01/16/2019 03:17 PM   HDL 41 01/16/2019 03:17 PM   CHOLHDL 4.1 01/16/2019 03:17 PM   CHOLHDL 4.4 06/17/2018 04:44 AM   LDLCALC 109 (H) 01/16/2019 03:17 PM    Wt Readings from Last 3 Encounters:  08/11/19 218 lb (98.9 kg)  08/03/19 223 lb 12.3 oz (101.5 kg)  01/16/19 233 lb (105.7 kg)     Objective:    Vital Signs:  Ht 6\' 1"  (1.854 m)   Wt 218 lb (98.9 kg)   BMI 28.76 kg/m    VITAL SIGNS:  reviewed  Able to complete full sentences without difficulty.  Alert and oriented x3  ASSESSMENT & PLAN:    Hypertensive cardiomyopathy -Years of difficult to control blood pressure, EF 20 to 25%.  Now on hemodialysis. -Continue with carvedilol -If blood pressure remains highly elevated, consider addition of isosorbide.  For now, continue with current regimen.  End-stage renal disease on hemodialysis -Tuesday Thursday Saturday.  Nephrology.  Intractable hiccups -Thought to be secondary to uremia.  Continue observation through nephrology or PCP.  EGD was performed on 12/22/2018 in the setting of anemia from chronic kidney disease, normal.  Chronic systolic heart failure -Continue with beta-blocker.  Not on ACE inhibitor secondary to renal disease.  Would be at risk for life-threatening infection if ICD were in place given his hemodialysis status.  We will see back in 6 months with APP    COVID-19 Education: The signs and symptoms of COVID-19 were discussed with the patient and how to seek care for  testing (follow up with PCP or arrange E-visit).  The importance of social distancing was discussed today.  Time:   Today, I have spent 21 minutes with the patient with telehealth technology discussing the above problems, review of hospital records, echocardiogram, EGD.     Medication Adjustments/Labs and Tests Ordered: Current medicines are reviewed at length with the patient today.  Concerns regarding medicines are outlined above.   Tests Ordered: No orders of the defined types were placed in this encounter.   Medication Changes: No orders of the defined types were placed in this encounter.   Follow Up:  In Person in 6 month(s)  Signed, Candee Furbish, MD  08/11/2019 9:10 AM    [facility]

## 2019-08-13 ENCOUNTER — Other Ambulatory Visit: Payer: Self-pay | Admitting: Family Medicine

## 2019-08-13 DIAGNOSIS — R066 Hiccough: Secondary | ICD-10-CM

## 2019-08-13 MED ORDER — PANTOPRAZOLE SODIUM 20 MG PO TBEC: 20.0000 mg | DELAYED_RELEASE_TABLET | Freq: Two times a day (BID) | ORAL | 1 refills | Status: DC

## 2019-08-13 MED ORDER — GABAPENTIN 100 MG PO CAPS: 100.0000 mg | ORAL_CAPSULE | Freq: Two times a day (BID) | ORAL | 0 refills | Status: DC

## 2019-08-13 NOTE — Progress Notes (Signed)
Patient ID: Corey Park, male   DOB: August 27, 1970, 49 y.o.   MRN: [id number]   See chart, patient's mother left message through [place] [facility] patient with intractable hiccups since hospitalization.  Mother would like medication prescribed for patient's hiccups.  Patient has not been seen since July 2020.  Patient with end-stage renal disease on dialysis therefore baclofen cannot be used for his hiccups.  We will try treatment with Protonix 20 mg twice daily and gabapentin 100 mg twice daily and mother will be encouraged to have patient seen at the emergency room or urgent care in follow-up of his hiccups and/or make office visit in the next 2 weeks.

## 2019-08-15 ENCOUNTER — Encounter: Payer: Self-pay | Admitting: Family Medicine

## 2019-08-18 NOTE — Telephone Encounter (Signed)
Patients mother sent this FYI message to all physicians listed in [place]. Patient is establishing care with [facility] next month.

## 2019-08-25 DIAGNOSIS — T7840XA Allergy, unspecified, initial encounter: Secondary | ICD-10-CM | POA: Insufficient documentation

## 2019-08-25 DIAGNOSIS — T829XXA Unspecified complication of cardiac and vascular prosthetic device, implant and graft, initial encounter: Secondary | ICD-10-CM

## 2019-08-25 DIAGNOSIS — N2581 Secondary hyperparathyroidism of renal origin: Secondary | ICD-10-CM | POA: Insufficient documentation

## 2019-08-25 DIAGNOSIS — E46 Unspecified protein-calorie malnutrition: Secondary | ICD-10-CM

## 2019-08-25 DIAGNOSIS — E1151 Type 2 diabetes mellitus with diabetic peripheral angiopathy without gangrene: Secondary | ICD-10-CM

## 2019-08-25 DIAGNOSIS — D689 Coagulation defect, unspecified: Secondary | ICD-10-CM | POA: Insufficient documentation

## 2019-08-25 HISTORY — DX: Unspecified complication of cardiac and vascular prosthetic device, implant and graft, initial encounter: T82.9XXA

## 2019-08-25 HISTORY — DX: Allergy, unspecified, initial encounter: T78.40XA

## 2019-08-25 HISTORY — DX: Unspecified protein-calorie malnutrition: E46

## 2019-08-25 HISTORY — DX: Type 2 diabetes mellitus with diabetic peripheral angiopathy without gangrene: E11.51

## 2019-08-29 ENCOUNTER — Telehealth: Payer: Self-pay | Admitting: Nurse Practitioner

## 2019-08-29 ENCOUNTER — Ambulatory Visit: Payer: Medicaid Other | Admitting: Cardiology

## 2019-08-29 NOTE — Telephone Encounter (Signed)
Pt was called voicemail was full could not leave a message

## 2019-08-29 NOTE — Telephone Encounter (Signed)
Pt was called and reminded of there appointment 

## 2019-09-01 ENCOUNTER — Ambulatory Visit: Payer: Medicaid Other | Admitting: Nurse Practitioner

## 2019-09-01 ENCOUNTER — Telehealth: Payer: Self-pay

## 2019-09-01 ENCOUNTER — Other Ambulatory Visit: Payer: Self-pay

## 2019-09-01 NOTE — Telephone Encounter (Signed)
Called to do tele visit check in. No answer. Voicemail was full.

## 2019-09-01 NOTE — Telephone Encounter (Signed)
Called x 2 to try to do check in for tele visit. No answer and no voicemail.

## 2019-09-15 ENCOUNTER — Ambulatory Visit: Payer: Medicaid Other | Attending: Internal Medicine

## 2019-09-15 DIAGNOSIS — Z23 Encounter for immunization: Secondary | ICD-10-CM

## 2019-09-15 NOTE — Progress Notes (Signed)
   Covid-19 Vaccination Clinic  Name:  Corey Park    MRN: [id number] DOB: Mar 03, 1971  09/15/2019  Mr. [name] was observed post Covid-19 immunization for 15 minutes without incident. He was provided with Vaccine Information Sheet and instruction to access the V-Safe system.   Mr. [name] was instructed to call 911 with any severe reactions post vaccine: [name] Difficulty breathing  . Swelling of face and throat  . A fast heartbeat  . A bad rash all over body  . Dizziness and weakness   Immunizations Administered    Name Date Dose VIS Date Route   Pfizer COVID-19 Vaccine 09/15/2019  9:29 AM 0.3 mL 06/06/2019 Intramuscular   Manufacturer: [place]   Lot: [id number]   [place]: [address]-7

## 2019-10-06 ENCOUNTER — Other Ambulatory Visit: Payer: Self-pay | Admitting: *Deleted

## 2019-10-06 DIAGNOSIS — N185 Chronic kidney disease, stage 5: Secondary | ICD-10-CM

## 2019-10-13 ENCOUNTER — Ambulatory Visit: Payer: Medicaid Other

## 2019-10-13 ENCOUNTER — Ambulatory Visit
Admission: RE | Admit: 2019-10-13 | Discharge: 2019-10-13 | Disposition: A | Discharge status: Home / Self Care | Payer: Medicaid Other | Source: Ambulatory Visit | Attending: Surgery | Admitting: Surgery

## 2019-10-13 ENCOUNTER — Ambulatory Visit (INDEPENDENT_AMBULATORY_CARE_PROVIDER_SITE_OTHER): Payer: Medicaid Other | Admitting: Surgery

## 2019-10-13 ENCOUNTER — Other Ambulatory Visit: Payer: Self-pay

## 2019-10-13 ENCOUNTER — Ambulatory Visit (INDEPENDENT_AMBULATORY_CARE_PROVIDER_SITE_OTHER)
Admission: RE | Admit: 2019-10-13 | Discharge: 2019-10-13 | Disposition: A | Discharge status: Home / Self Care | Payer: Medicaid Other | Source: Ambulatory Visit | Attending: Surgery | Admitting: Surgery

## 2019-10-13 ENCOUNTER — Encounter: Payer: Self-pay | Admitting: Surgery

## 2019-10-13 VITALS — BP 156/95 | HR 91 | Temp 97.8°F | Resp 20 | Ht 74.0 in | Wt 218.0 lb

## 2019-10-13 DIAGNOSIS — N186 End stage renal disease: Secondary | ICD-10-CM

## 2019-10-13 DIAGNOSIS — N185 Chronic kidney disease, stage 5: Secondary | ICD-10-CM | POA: Insufficient documentation

## 2019-10-13 DIAGNOSIS — Z992 Dependence on renal dialysis: Secondary | ICD-10-CM

## 2019-10-13 DIAGNOSIS — Z23 Encounter for immunization: Secondary | ICD-10-CM

## 2019-10-13 NOTE — H&P (View-Only) (Signed)
[facility]  Patient name: Corey Park MRN: [id number] DOB: [birth year] Sex: male   REQUESTING PROVIDER:    Dr. Marval Regal   REASON FOR CONSULT:    Dialysis access  HISTORY OF PRESENT ILLNESS:   Corey Park is a 49 y.o. male, who has a history of heart failure and end-stage renal disease on dialysis who is here today for discussions regarding access.  He started hemodialysis during a Covid admission.  He also has very difficult to control retention.  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  He is on dialysis Monday Wednesday Friday  PAST MEDICAL HISTORY    Past Medical History:  Diagnosis Date  . Asthma   . Diabetes mellitus without complication ([place])   . Hypertension   . Renal disorder      FAMILY HISTORY   Family History  Problem Relation Age of Onset  . CAD Mother   . Hypertension Mother   . Diabetes Neg Hx   . Stroke Neg Hx   . Cancer Neg Hx   . Kidney failure Neg Hx     SOCIAL HISTORY:   Social History   Socioeconomic History  . Marital status: Single    Spouse name: Not on file  . Number of children: Not on file  . Years of education: Not on file  . Highest education level: Not on file  Occupational History  . Not on file  Tobacco Use  . Smoking status: Never Smoker  . Smokeless tobacco: Never Used  Substance and Sexual Activity  . Alcohol use: Never    Comment: denies  . Drug use: Never    Types: Marijuana    Comment: denies  . Sexual activity: Not on file  Other Topics Concern  . Not on file  Social History Narrative  . Not on file   Social Determinants of Health   Financial Resource Strain:   . Difficulty of Paying Living Expenses:   Food Insecurity:   . Worried About Charity fundraiser in the Last Year:   . Arboriculturist in the Last Year:   Transportation Needs:   . Film/video editor (Medical):   [name] Lack of  Transportation (Non-Medical):   Physical Activity:   . Days of Exercise per Week:   . Minutes of Exercise per Session:   Stress:   . Feeling of Stress :   Social Connections:   . Frequency of Communication with Friends and Family:   . Frequency of Social Gatherings with Friends and Family:   . Attends Religious Services:   . Active Member of Clubs or Organizations:   . Attends Archivist Meetings:   [name] Marital Status:   Intimate Partner Violence:   . Fear of Current or Ex-Partner:   . Emotionally Abused:   [name] Physically Abused:   . Sexually Abused:     ALLERGIES:    No Known Allergies  CURRENT MEDICATIONS:    Current Outpatient Medications  Medication Sig Dispense Refill  . atorvastatin (LIPITOR) 20 MG tablet One pill each evening to lower cholesterol 30 tablet 0  . calcium acetate (PHOSLO) 667 MG tablet Take by mouth.    . gabapentin (NEURONTIN) 100 MG capsule Take 1 capsule (100 mg total) by mouth 2 (two) times daily. 60 capsule 0  . multivitamin (RENA-VIT) TABS tablet Take 1 tablet by mouth daily.    . pantoprazole (PROTONIX) 20 MG tablet Take 1  tablet (20 mg total) by mouth 2 (two) times daily. 60 tablet 1  . carvedilol (COREG) 25 MG tablet Take 1 tablet (25 mg total) by mouth 2 (two) times daily with a meal. 120 tablet 0  . ferrous sulfate 325 (65 FE) MG tablet Take 1 tablet (325 mg total) by mouth daily. 30 tablet 0  . hydrALAZINE (APRESOLINE) 50 MG tablet Take 1 tablet (50 mg total) by mouth 3 (three) times daily. 90 tablet 0   No current facility-administered medications for this visit.    REVIEW OF SYSTEMS:   [X]  denotes positive finding, [ ]  denotes negative finding Cardiac  Comments:  Chest pain or chest pressure:    Shortness of breath upon exertion:    Short of breath when lying flat:    Irregular heart rhythm:        Vascular    Pain in calf, thigh, or hip brought on by ambulation:    Pain in feet at night that wakes you up from your sleep:      Blood clot in your veins:    Leg swelling:         Pulmonary    Oxygen at home:    Productive cough:     Wheezing:         Neurologic    Sudden weakness in arms or legs:     Sudden numbness in arms or legs:     Sudden onset of difficulty speaking or slurred speech:    Temporary loss of vision in one eye:     Problems with dizziness:         Gastrointestinal    Blood in stool:      Vomited blood:         Genitourinary    Burning when urinating:     Blood in urine:        Psychiatric    Major depression:         Hematologic    Bleeding problems:    Problems with blood clotting too easily:        Skin    Rashes or ulcers:        Constitutional    Fever or chills:     PHYSICAL EXAM:   Vitals:   10/13/19 1449  BP: (!) 156/95  Pulse: 91  Resp: 20  Temp: 97.8 F (36.6 C)  SpO2: 99%  Weight: 218 lb (98.9 kg)  Height: 6\' 2"  (1.88 m)    GENERAL: The patient is a well-nourished male, in no acute distress. The vital signs are documented above. CARDIAC: There is a regular rate and rhythm.  VASCULAR: Palpable radial pulse PULMONARY: Nonlabored respirations MUSCULOSKELETAL: There are no major deformities or cyanosis. NEUROLOGIC: No focal weakness or paresthesias are detected. SKIN: There are no ulcers or rashes noted. PSYCHIATRIC: The patient has a normal affect.  STUDIES:   I have reviewed the following: +-----------------+-------------+----------+--------+  Right Cephalic  Diameter (cm)Depth (cm)Findings  +-----------------+-------------+----------+--------+  Shoulder       0.32              +-----------------+-------------+----------+--------+  Prox upper arm    0.32              +-----------------+-------------+----------+--------+  Mid upper arm    0.32              +-----------------+-------------+----------+--------+  Dist upper arm    0.29                +-----------------+-------------+----------+--------+  Antecubital fossa  0.53              +-----------------+-------------+----------+--------+  Prox forearm     0.29              +-----------------+-------------+----------+--------+  Mid forearm     0.28              +-----------------+-------------+----------+--------+  Dist forearm     0.30              +-----------------+-------------+----------+--------+   +--------------+-------------+----------+--------+  Right Basilic Diameter (cm)Depth (cm)Findings  +--------------+-------------+----------+--------+  Prox upper arm  0.50              +--------------+-------------+----------+--------+  Mid upper arm   0.40              +--------------+-------------+----------+--------+  Dist upper arm  0.39              +--------------+-------------+----------+--------+   +--------------+-------------+----------+-------------------+  Left Cephalic Diameter (cm)Depth (cm)   Findings     +--------------+-------------+----------+-------------------+  Shoulder     0.37                    +--------------+-------------+----------+-------------------+  Prox upper arm  0.32                    +--------------+-------------+----------+-------------------+  Mid upper arm   0.23                    +--------------+-------------+----------+-------------------+  Dist upper arm              many branches    +--------------+-------------+----------+-------------------+  Prox forearm   9.62         to basilic vein   +--------------+-------------+----------+-------------------+  Mid forearm    0.08                    +--------------+-------------+----------+-------------------+   Dist forearm             many small branches  +--------------+-------------+----------+-------------------+   +-----------------+-------------+----------+---------------+  Left Basilic   Diameter (cm)Depth (cm)  Findings    +-----------------+-------------+----------+---------------+  Mid upper arm    0.44                  +-----------------+-------------+----------+---------------+  Dist upper arm    0.44                  +-----------------+-------------+----------+---------------+  Antecubital fossa  0.24        from cephalic v  +-----------------+-------------+----------+---------------+   Arterial evaluation: Right: Patent brachial, radial, and ulnar arteries. Bifurcation at     the antecubitus.  Left: Patent brachial, radial, and ulnar arteries. Bifurcation at    the antecubitus.   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  I discussed proceeding with a right brachiocephalic fistula.  If the cephalic vein at the antecubital crease is not adequate I did talk about using his basilic vein as a two-stage procedure.  He currently has a catheter on the right.  His procedure been scheduled for Tuesday, April 22.   Leia Alf, MD, FACS [facility] [phone] Pager [phone]

## 2019-10-13 NOTE — Progress Notes (Signed)
   Covid-19 Vaccination Clinic  Name:  Corey Park    MRN: [id number] DOB: Nov 23, 1970  10/13/2019  Mr. [name] was observed post Covid-19 immunization for 15 minutes without incident. He was provided with Vaccine Information Sheet and instruction to access the V-Safe system.   Mr. [name] was instructed to call 911 with any severe reactions post vaccine: [name] Difficulty breathing  . Swelling of face and throat  . A fast heartbeat  . A bad rash all over body  . Dizziness and weakness   Immunizations Administered    Name Date Dose VIS Date Route   Pfizer COVID-19 Vaccine 10/13/2019 10:10 AM 0.3 mL 08/20/2018 Intramuscular   Manufacturer: [place]   Lot: [id number]   [place]: [id number]

## 2019-10-13 NOTE — Progress Notes (Signed)
Vascular and Vein Specialist of [facility]  Patient name: Corey Park MRN: [id number] DOB: 1970/09/18 Sex: male   REQUESTING PROVIDER:    Dr. Marval Regal   REASON FOR CONSULT:    Dialysis access  HISTORY OF PRESENT ILLNESS:   Corey Park is a 48 y.o. male, who has a history of heart failure and end-stage renal disease on dialysis who is here today for discussions regarding access.  He started hemodialysis during a Covid admission.  He also has very difficult to control retention.  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  He is on dialysis Monday Wednesday Friday  PAST MEDICAL HISTORY    Past Medical History:  Diagnosis Date  . Asthma   . Diabetes mellitus without complication ([place])   . Hypertension   . Renal disorder      FAMILY HISTORY   Family History  Problem Relation Age of Onset  . CAD Mother   . Hypertension Mother   . Diabetes Neg Hx   . Stroke Neg Hx   . Cancer Neg Hx   . Kidney failure Neg Hx     SOCIAL HISTORY:   Social History   Socioeconomic History  . Marital status: Single    Spouse name: Not on file  . Number of children: Not on file  . Years of education: Not on file  . Highest education level: Not on file  Occupational History  . Not on file  Tobacco Use  . Smoking status: Never Smoker  . Smokeless tobacco: Never Used  Substance and Sexual Activity  . Alcohol use: Never    Comment: denies  . Drug use: Never    Types: Marijuana    Comment: denies  . Sexual activity: Not on file  Other Topics Concern  . Not on file  Social History Narrative  . Not on file   Social Determinants of Health   Financial Resource Strain:   . Difficulty of Paying Living Expenses:   Food Insecurity:   . Worried About Charity fundraiser in the Last Year:   . Arboriculturist in the Last Year:   Transportation Needs:   . Film/video editor (Medical):   [name] Lack of  Transportation (Non-Medical):   Physical Activity:   . Days of Exercise per Week:   . Minutes of Exercise per Session:   Stress:   . Feeling of Stress :   Social Connections:   . Frequency of Communication with Friends and Family:   . Frequency of Social Gatherings with Friends and Family:   . Attends Religious Services:   . Active Member of Clubs or Organizations:   . Attends Archivist Meetings:   [name] Marital Status:   Intimate Partner Violence:   . Fear of Current or Ex-Partner:   . Emotionally Abused:   [name] Physically Abused:   . Sexually Abused:     ALLERGIES:    No Known Allergies  CURRENT MEDICATIONS:    Current Outpatient Medications  Medication Sig Dispense Refill  . atorvastatin (LIPITOR) 20 MG tablet One pill each evening to lower cholesterol 30 tablet 0  . calcium acetate (PHOSLO) 667 MG tablet Take by mouth.    . gabapentin (NEURONTIN) 100 MG capsule Take 1 capsule (100 mg total) by mouth 2 (two) times daily. 60 capsule 0  . multivitamin (RENA-VIT) TABS tablet Take 1 tablet by mouth daily.    . pantoprazole (PROTONIX) 20 MG tablet Take 1  tablet (20 mg total) by mouth 2 (two) times daily. 60 tablet 1  . carvedilol (COREG) 25 MG tablet Take 1 tablet (25 mg total) by mouth 2 (two) times daily with a meal. 120 tablet 0  . ferrous sulfate 325 (65 FE) MG tablet Take 1 tablet (325 mg total) by mouth daily. 30 tablet 0  . hydrALAZINE (APRESOLINE) 50 MG tablet Take 1 tablet (50 mg total) by mouth 3 (three) times daily. 90 tablet 0   No current facility-administered medications for this visit.    REVIEW OF SYSTEMS:   [X]  denotes positive finding, [ ]  denotes negative finding Cardiac  Comments:  Chest pain or chest pressure:    Shortness of breath upon exertion:    Short of breath when lying flat:    Irregular heart rhythm:        Vascular    Pain in calf, thigh, or hip brought on by ambulation:    Pain in feet at night that wakes you up from your sleep:      Blood clot in your veins:    Leg swelling:         Pulmonary    Oxygen at home:    Productive cough:     Wheezing:         Neurologic    Sudden weakness in arms or legs:     Sudden numbness in arms or legs:     Sudden onset of difficulty speaking or slurred speech:    Temporary loss of vision in one eye:     Problems with dizziness:         Gastrointestinal    Blood in stool:      Vomited blood:         Genitourinary    Burning when urinating:     Blood in urine:        Psychiatric    Major depression:         Hematologic    Bleeding problems:    Problems with blood clotting too easily:        Skin    Rashes or ulcers:        Constitutional    Fever or chills:     PHYSICAL EXAM:   Vitals:   10/13/19 1449  BP: (!) 156/95  Pulse: 91  Resp: 20  Temp: 97.8 F (36.6 C)  SpO2: 99%  Weight: 218 lb (98.9 kg)  Height: 6\' 2"  (1.88 m)    GENERAL: The patient is a well-nourished male, in no acute distress. The vital signs are documented above. CARDIAC: There is a regular rate and rhythm.  VASCULAR: Palpable radial pulse PULMONARY: Nonlabored respirations MUSCULOSKELETAL: There are no major deformities or cyanosis. NEUROLOGIC: No focal weakness or paresthesias are detected. SKIN: There are no ulcers or rashes noted. PSYCHIATRIC: The patient has a normal affect.  STUDIES:   I have reviewed the following: +-----------------+-------------+----------+--------+  Right Cephalic  Diameter (cm)Depth (cm)Findings  +-----------------+-------------+----------+--------+  Shoulder       0.32              +-----------------+-------------+----------+--------+  Prox upper arm    0.32              +-----------------+-------------+----------+--------+  Mid upper arm    0.32              +-----------------+-------------+----------+--------+  Dist upper arm    0.29                +-----------------+-------------+----------+--------+  Antecubital fossa  0.53              +-----------------+-------------+----------+--------+  Prox forearm     0.29              +-----------------+-------------+----------+--------+  Mid forearm     0.28              +-----------------+-------------+----------+--------+  Dist forearm     0.30              +-----------------+-------------+----------+--------+   +--------------+-------------+----------+--------+  Right Basilic Diameter (cm)Depth (cm)Findings  +--------------+-------------+----------+--------+  Prox upper arm  0.50              +--------------+-------------+----------+--------+  Mid upper arm   0.40              +--------------+-------------+----------+--------+  Dist upper arm  0.39              +--------------+-------------+----------+--------+   +--------------+-------------+----------+-------------------+  Left Cephalic Diameter (cm)Depth (cm)   Findings     +--------------+-------------+----------+-------------------+  Shoulder     0.37                    +--------------+-------------+----------+-------------------+  Prox upper arm  0.32                    +--------------+-------------+----------+-------------------+  Mid upper arm   0.23                    +--------------+-------------+----------+-------------------+  Dist upper arm              many branches    +--------------+-------------+----------+-------------------+  Prox forearm   8.00         to basilic vein   +--------------+-------------+----------+-------------------+  Mid forearm    0.08                    +--------------+-------------+----------+-------------------+   Dist forearm             many small branches  +--------------+-------------+----------+-------------------+   +-----------------+-------------+----------+---------------+  Left Basilic   Diameter (cm)Depth (cm)  Findings    +-----------------+-------------+----------+---------------+  Mid upper arm    0.44                  +-----------------+-------------+----------+---------------+  Dist upper arm    0.44                  +-----------------+-------------+----------+---------------+  Antecubital fossa  0.24        from cephalic v  +-----------------+-------------+----------+---------------+   Arterial evaluation: Right: Patent brachial, radial, and ulnar arteries. Bifurcation at     the antecubitus.  Left: Patent brachial, radial, and ulnar arteries. Bifurcation at    the antecubitus.   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  I discussed proceeding with a right brachiocephalic fistula.  If the cephalic vein at the antecubital crease is not adequate I did talk about using his basilic vein as a two-stage procedure.  He currently has a catheter on the right.  His procedure been scheduled for Tuesday, April 22.   Leia Alf, MD, FACS [facility] and Vein Specialists of [facility] [phone] Pager [phone]

## 2019-10-14 ENCOUNTER — Other Ambulatory Visit: Payer: Self-pay

## 2019-10-15 ENCOUNTER — Other Ambulatory Visit: Payer: Self-pay

## 2019-10-15 ENCOUNTER — Inpatient Hospital Stay (HOSPITAL_COMMUNITY): Admission: RE | Admit: 2019-10-15 | Payer: Medicaid Other | Source: Ambulatory Visit

## 2019-10-15 ENCOUNTER — Other Ambulatory Visit (HOSPITAL_COMMUNITY): Payer: Medicaid Other

## 2019-10-15 ENCOUNTER — Encounter (HOSPITAL_COMMUNITY): Payer: Self-pay | Admitting: Surgery

## 2019-10-15 NOTE — Progress Notes (Addendum)
Patient positive for COVID on 07/27/19, patient does not need to be retested before procedure on 4/22 per 90 window protocol of no retest needed    Status:  Final result   Visible to patient:  Yes (MyChart) Next appt:  None  Ref Range & Units 2 mo ago  SARS Coronavirus 2 Ag NEGATIVE POSITIVEAbnormal    Comment: (NOTE)  SARS-CoV-2 antigen PRESENT.  Positive results indicate the presence of viral antigens, but  clinical correlation with patient history and other diagnostic  information is necessary to determine patient infection status.  Positive results do not rule out bacterial infection or co-infection  with other viruses. False positive results are rare but can occur,  and confirmatory RT-PCR testing may be appropriate in some  circumstances. The expected result is Negative.  Fact Sheet for Patients: PodPark.tn  Fact Sheet for Providers: GiftContent.is  This test is not yet approved or cleared by the [place] FDA and  has been authorized for detection and/or diagnosis of SARS-CoV-2 by  FDA under an Emergency Use Authorization (EUA).  This EUA will  remain in effect (meaning this test can be used) for the duration of  the COVID-19 declaration under Section 564(b)(1) of the Act, 21  U.S.C. section 360bbb-3(b)(1), unless the authorization is  terminated or revoked sooner.   Resulting Agency  [facility]      Specimen Collected: 07/27/19 19:35 Last Resulted: 07/27/19 19:51

## 2019-10-15 NOTE — Anesthesia Preprocedure Evaluation (Addendum)
Anesthesia Evaluation  Patient identified by MRN, date of birth, ID band Patient awake    Reviewed: Allergy & Precautions, NPO status , Patient's Chart, lab work & pertinent test results, reviewed documented beta blocker date and time   Airway Mallampati: II  TM Distance: >3 FB Neck ROM: Full    Dental  (+) Dental Advisory Given, Missing   Pulmonary asthma ,    Pulmonary exam normal breath sounds clear to auscultation       Cardiovascular hypertension, Pt. on home beta blockers (-) angina+CHF  Normal cardiovascular exam Rhythm:Regular Rate:Normal  TTE 07/28/19: 1. Left ventricular ejection fraction, by visual estimation, is 20 to  25%. The left ventricle has severely decreased function. There is severely  increased left ventricular wall thickness.  2. Elevated left atrial pressure.  3. Left ventricular diastolic parameters are consistent with Grade II  diastolic dysfunction (pseudonormalization).  4. Mildly dilated left ventricular internal cavity size.  5. The left ventricle demonstrates global hypokinesis.  6. Global right ventricle has severely reduced systolic function.The  right ventricular size is normal.  7. Trivial pericardial effusion is present.  8. The mitral valve is normal in structure. Trivial mitral valve  regurgitation. No evidence of mitral stenosis.  9. The tricuspid valve was normal in structure. Tricuspid valve  regurgitation is mild.  10. Mild aortic valve sclerosis without stenosis.  11. The pulmonic valve was normal in structure. Pulmonic valve  regurgitation is mild by color flow Doppler.  12. Aortic dilatation noted.  13. There is mild dilatation of the aortic root measuring 39 mm.  14. Moderately elevated pulmonary artery systolic pressure.  15. The inferior vena cava is dilated in size with <50% respiratory  variability, suggesting right atrial pressure of 15 mmHg.  16. Severe global  reduction in LV systolic function; severe LVH; mild LVE;  severe RV dysfunction; mild TR; moderate pulmonary hypertension. )    Neuro/Psych negative neurological ROS  negative psych ROS   GI/Hepatic Neg liver ROS, GERD  Medicated and Controlled,  Endo/Other  diabetes, Type 2  Renal/GU ESRF and DialysisRenal disease (TTHSat)     Musculoskeletal negative musculoskeletal ROS (+)   Abdominal   Peds  Hematology negative hematology ROS (+)   Anesthesia Other Findings Day of surgery medications reviewed with the patient.  Reproductive/Obstetrics                           Anesthesia Physical Anesthesia Plan  ASA: IV  Anesthesia Plan: MAC   Post-op Pain Management:    Induction: Intravenous  PONV Risk Score and Plan: 1 and Propofol infusion and Treatment may vary due to age or medical condition  Airway Management Planned: Natural Airway and Nasal Cannula  Additional Equipment:   Intra-op Plan:   Post-operative Plan:   Informed Consent: I have reviewed the patients History and Physical, chart, labs and discussed the procedure including the risks, benefits and alternatives for the proposed anesthesia with the patient or authorized representative who has indicated his/her understanding and acceptance.     Dental advisory given  Plan Discussed with: CRNA  Anesthesia Plan Comments: (Recent COVID-19 hospitalization discharged on 08/03/2019.  PMHx of HFrEF EF (20-25% per TTE on 07/28/2019), DM-2, stage V CKD, dyslipidemia-who presented with generalized weakness and fluid overload. In addition to Covid PNA he was found to have AKI superimposed on CKD stage V-with progression to ESRD and started on HD via R TDC. Blood pressure control as been an  issue for years.  He is being followed outpatient by cardiologist Dr. Marlou Porch, last seen 08/11/19, recommended continue current medication regimen and followup in 6 months.  DMII, last A1c 6.0 on 07/28/19.  Will need  DOS labs and eval.   EKG 07/27/19: Sinus tachycardia. Rate 115. Borderline LAD. Abnormal T, consider ischemia, lateral leads, flipped t waves in v6, I, avL seen on prior. Otherwise no significant change  TTE 07/28/19: 1. Left ventricular ejection fraction, by visual estimation, is 20 to  25%. The left ventricle has severely decreased function. There is severely  increased left ventricular wall thickness.  2. Elevated left atrial pressure.  3. Left ventricular diastolic parameters are consistent with Grade II  diastolic dysfunction (pseudonormalization).  4. Mildly dilated left ventricular internal cavity size.  5. The left ventricle demonstrates global hypokinesis.  6. Global right ventricle has severely reduced systolic function.The  right ventricular size is normal.  7. Trivial pericardial effusion is present.  8. The mitral valve is normal in structure. Trivial mitral valve  regurgitation. No evidence of mitral stenosis.  9. The tricuspid valve was normal in structure. Tricuspid valve  regurgitation is mild.  10. Mild aortic valve sclerosis without stenosis.  11. The pulmonic valve was normal in structure. Pulmonic valve  regurgitation is mild by color flow Doppler.  12. Aortic dilatation noted.  13. There is mild dilatation of the aortic root measuring 39 mm.  14. Moderately elevated pulmonary artery systolic pressure.  15. The inferior vena cava is dilated in size with <50% respiratory  variability, suggesting right atrial pressure of 15 mmHg.  16. Severe global reduction in LV systolic function; severe LVH; mild LVE;  severe RV dysfunction; mild TR; moderate pulmonary hypertension. )      Anesthesia Quick Evaluation

## 2019-10-15 NOTE — Progress Notes (Signed)
Patient denies shortness of breath, fever, cough and chest pain.  PCP - None Cardiologist - Dr Candee Furbish Nephrology -  Dr. Marval Regal   Chest x-ray - 07/27/19 (2V) EKG - 07/27/19 Stress Test - n/a ECHO - 07/28/19 Cardiac Cath - n/a  Anesthesia review: Yes  STOP now taking any Aspirin (unless otherwise instructed by your surgeon), Aleve, Naproxen, Ibuprofen, Motrin, Advil, Goody's, BC's, all herbal medications, fish oil, and all vitamins.   Coronavirus Screening Covid test scheduled at 2pm 10/15/19. Do you have the following symptoms:  Cough yes/no: No Fever (>100.56F)  yes/no: No Runny nose yes/no: No Sore throat yes/no: No Difficulty breathing/shortness of breath  yes/no: No  Have you traveled in the last 14 days and where? yes/no: No  Patient verbalized understanding of instructions that were given via phone.

## 2019-10-15 NOTE — Progress Notes (Signed)
Anesthesia Chart Review: Same day workup  Recent COVID-19 hospitalization discharged on 08/03/2019.  PMHx of HFrEF EF (20-25% per TTE on 07/28/2019), DM-2, stage V CKD, dyslipidemia-who presented with generalized weakness and fluid overload. In addition to Covid PNA he was found to have AKI superimposed on CKD stage V-with progression to ESRD and started on HD via R TDC. Blood pressure control as been an issue for years.  He is being followed outpatient by cardiologist Dr. Marlou Porch, last seen 08/11/19, recommended continue current medication regimen and followup in 6 months.  DMII, last A1c 6.0 on 07/28/19.  Will need DOS labs and eval.   EKG 07/27/19: Sinus tachycardia. Rate 115. Borderline LAD. Abnormal T, consider ischemia, lateral leads, flipped t waves in v6, I, avL seen on prior. Otherwise no significant change  TTE 07/28/19: 1. Left ventricular ejection fraction, by visual estimation, is 20 to  25%. The left ventricle has severely decreased function. There is severely  increased left ventricular wall thickness.  2. Elevated left atrial pressure.  3. Left ventricular diastolic parameters are consistent with Grade II  diastolic dysfunction (pseudonormalization).  4. Mildly dilated left ventricular internal cavity size.  5. The left ventricle demonstrates global hypokinesis.  6. Global right ventricle has severely reduced systolic function.The  right ventricular size is normal.  7. Trivial pericardial effusion is present.  8. The mitral valve is normal in structure. Trivial mitral valve  regurgitation. No evidence of mitral stenosis.  9. The tricuspid valve was normal in structure. Tricuspid valve  regurgitation is mild.  10. Mild aortic valve sclerosis without stenosis.  11. The pulmonic valve was normal in structure. Pulmonic valve  regurgitation is mild by color flow Doppler.  12. Aortic dilatation noted.  13. There is mild dilatation of the aortic root measuring 39 mm.  14.  Moderately elevated pulmonary artery systolic pressure.  15. The inferior vena cava is dilated in size with <50% respiratory  variability, suggesting right atrial pressure of 15 mmHg.  16. Severe global reduction in LV systolic function; severe LVH; mild LVE;  severe RV dysfunction; mild TR; moderate pulmonary hypertension.   Wynonia Musty [facility]/Anesthesiology Phone [phone] 10/15/2019 11:12 AM

## 2019-10-16 ENCOUNTER — Encounter (HOSPITAL_COMMUNITY)
Admission: RE | Disposition: A | Discharge status: Home / Self Care | Payer: Self-pay | Source: Home / Self Care | Attending: Surgery

## 2019-10-16 ENCOUNTER — Ambulatory Visit (HOSPITAL_COMMUNITY)
Admission: RE | Admit: 2019-10-16 | Discharge: 2019-10-16 | Disposition: A | Discharge status: Home / Self Care | Payer: Medicaid Other | Attending: Surgery | Admitting: Surgery

## 2019-10-16 ENCOUNTER — Encounter (HOSPITAL_COMMUNITY): Payer: Self-pay | Admitting: Surgery

## 2019-10-16 ENCOUNTER — Ambulatory Visit (HOSPITAL_COMMUNITY): Payer: Medicaid Other | Admitting: Physician Assistant

## 2019-10-16 DIAGNOSIS — I120 Hypertensive chronic kidney disease with stage 5 chronic kidney disease or end stage renal disease: Secondary | ICD-10-CM | POA: Diagnosis not present

## 2019-10-16 DIAGNOSIS — H538 Other visual disturbances: Secondary | ICD-10-CM | POA: Insufficient documentation

## 2019-10-16 DIAGNOSIS — Z79899 Other long term (current) drug therapy: Secondary | ICD-10-CM | POA: Diagnosis not present

## 2019-10-16 DIAGNOSIS — N186 End stage renal disease: Secondary | ICD-10-CM | POA: Diagnosis not present

## 2019-10-16 DIAGNOSIS — N185 Chronic kidney disease, stage 5: Secondary | ICD-10-CM | POA: Diagnosis not present

## 2019-10-16 DIAGNOSIS — E1122 Type 2 diabetes mellitus with diabetic chronic kidney disease: Secondary | ICD-10-CM | POA: Insufficient documentation

## 2019-10-16 DIAGNOSIS — J45909 Unspecified asthma, uncomplicated: Secondary | ICD-10-CM | POA: Insufficient documentation

## 2019-10-16 DIAGNOSIS — Z8616 Personal history of COVID-19: Secondary | ICD-10-CM | POA: Diagnosis not present

## 2019-10-16 DIAGNOSIS — Z992 Dependence on renal dialysis: Secondary | ICD-10-CM | POA: Diagnosis not present

## 2019-10-16 HISTORY — PX: BASCILIC VEIN TRANSPOSITION: SHX5742

## 2019-10-16 HISTORY — DX: Unspecified visual loss: H54.7

## 2019-10-16 LAB — POCT I-STAT, CHEM 8
BUN: 24 mg/dL — ABNORMAL HIGH (ref 6–20)
Calcium, Ion: 1.11 mmol/L — ABNORMAL LOW (ref 1.15–1.40)
Chloride: 94 mmol/L — ABNORMAL LOW (ref 98–111)
Creatinine, Ser: 8.9 mg/dL — ABNORMAL HIGH (ref 0.61–1.24)
Glucose, Bld: 121 mg/dL — ABNORMAL HIGH (ref 70–99)
HCT: 40 % (ref 39.0–52.0)
Hemoglobin: 13.6 g/dL (ref 13.0–17.0)
Potassium: 4.2 mmol/L (ref 3.5–5.1)
Sodium: 135 mmol/L (ref 135–145)
TCO2: 33 mmol/L — ABNORMAL HIGH (ref 22–32)

## 2019-10-16 LAB — GLUCOSE, CAPILLARY
Glucose-Capillary: 114 mg/dL — ABNORMAL HIGH (ref 70–99)
Glucose-Capillary: 118 mg/dL — ABNORMAL HIGH (ref 70–99)

## 2019-10-16 SURGERY — TRANSPOSITION, VEIN, BASILIC
Anesthesia: Monitor Anesthesia Care | Laterality: Right

## 2019-10-16 MED ORDER — MIDAZOLAM HCL 2 MG/2ML IJ SOLN
INTRAMUSCULAR | Status: AC
  Filled 2019-10-16: qty 2

## 2019-10-16 MED ORDER — LIDOCAINE-EPINEPHRINE 1 %-1:100000 IJ SOLN
INTRAMUSCULAR | Status: AC
  Filled 2019-10-16: qty 1

## 2019-10-16 MED ORDER — CEFAZOLIN SODIUM-DEXTROSE 2-4 GM/100ML-% IV SOLN
2.0000 g | INTRAVENOUS | Status: AC
  Administered 2019-10-16: 2 g via INTRAVENOUS

## 2019-10-16 MED ORDER — 0.9 % SODIUM CHLORIDE (POUR BTL) OPTIME
TOPICAL | Status: DC | PRN
  Administered 2019-10-16: 1000 mL

## 2019-10-16 MED ORDER — PROPOFOL 10 MG/ML IV BOLUS
INTRAVENOUS | Status: AC
  Filled 2019-10-16: qty 20

## 2019-10-16 MED ORDER — FENTANYL CITRATE (PF) 100 MCG/2ML IJ SOLN
INTRAMUSCULAR | Status: DC | PRN
  Administered 2019-10-16: 50 ug via INTRAVENOUS

## 2019-10-16 MED ORDER — ONDANSETRON HCL 4 MG/2ML IJ SOLN
INTRAMUSCULAR | Status: DC | PRN
  Administered 2019-10-16: 4 mg via INTRAVENOUS

## 2019-10-16 MED ORDER — LIDOCAINE-EPINEPHRINE 1 %-1:100000 IJ SOLN
INTRAMUSCULAR | Status: DC | PRN
  Administered 2019-10-16: 5 mL via INTRADERMAL

## 2019-10-16 MED ORDER — OXYCODONE-ACETAMINOPHEN 5-325 MG PO TABS: 1.0000 | ORAL_TABLET | Freq: Four times a day (QID) | ORAL | 0 refills | Status: DC | PRN

## 2019-10-16 MED ORDER — SODIUM CHLORIDE 0.9 % IV SOLN
INTRAVENOUS | Status: AC
  Filled 2019-10-16: qty 1.2

## 2019-10-16 MED ORDER — EPHEDRINE SULFATE-NACL 50-0.9 MG/10ML-% IV SOSY
PREFILLED_SYRINGE | INTRAVENOUS | Status: DC | PRN
  Administered 2019-10-16: 10 mg via INTRAVENOUS

## 2019-10-16 MED ORDER — CHLORHEXIDINE GLUCONATE 4 % EX LIQD: 60.0000 mL | Freq: Once | CUTANEOUS | Status: DC

## 2019-10-16 MED ORDER — ACETAMINOPHEN 500 MG PO TABS
1000.0000 mg | ORAL_TABLET | Freq: Once | ORAL | Status: AC
  Administered 2019-10-16: 1000 mg via ORAL
  Filled 2019-10-16: qty 2

## 2019-10-16 MED ORDER — PHENYLEPHRINE HCL-NACL 10-0.9 MG/250ML-% IV SOLN
INTRAVENOUS | Status: DC | PRN
  Administered 2019-10-16: 20 ug/min via INTRAVENOUS

## 2019-10-16 MED ORDER — SODIUM CHLORIDE 0.9 % IV SOLN
INTRAVENOUS | Status: DC | PRN
  Administered 2019-10-16: 500 mL

## 2019-10-16 MED ORDER — ONDANSETRON HCL 4 MG/2ML IJ SOLN: 4.0000 mg | Freq: Once | INTRAMUSCULAR | Status: DC | PRN

## 2019-10-16 MED ORDER — FENTANYL CITRATE (PF) 100 MCG/2ML IJ SOLN: 25.0000 ug | INTRAMUSCULAR | Status: DC | PRN

## 2019-10-16 MED ORDER — PROPOFOL 500 MG/50ML IV EMUL
INTRAVENOUS | Status: DC | PRN
  Administered 2019-10-16: 100 ug/kg/min via INTRAVENOUS

## 2019-10-16 MED ORDER — CEFAZOLIN SODIUM-DEXTROSE 2-4 GM/100ML-% IV SOLN
INTRAVENOUS | Status: AC
  Filled 2019-10-16: qty 100

## 2019-10-16 MED ORDER — SODIUM CHLORIDE 0.9 % IV SOLN: INTRAVENOUS | Status: DC | PRN

## 2019-10-16 MED ORDER — SODIUM CHLORIDE 0.9 % IV SOLN: INTRAVENOUS | Status: DC

## 2019-10-16 MED ORDER — FENTANYL CITRATE (PF) 250 MCG/5ML IJ SOLN
INTRAMUSCULAR | Status: AC
  Filled 2019-10-16: qty 5

## 2019-10-16 SURGICAL SUPPLY — 30 items
ARMBAND PINK RESTRICT EXTREMIT (MISCELLANEOUS) ×3 IMPLANT
CANISTER SUCT 3000ML PPV (MISCELLANEOUS) ×3 IMPLANT
CLIP VESOCCLUDE MED 6/CT (CLIP) ×3 IMPLANT
CLIP VESOCCLUDE SM WIDE 6/CT (CLIP) ×3 IMPLANT
COVER PROBE W GEL 5X96 (DRAPES) ×3 IMPLANT
COVER WAND RF STERILE (DRAPES) IMPLANT
DERMABOND ADVANCED (GAUZE/BANDAGES/DRESSINGS) ×2
DERMABOND ADVANCED .7 DNX12 (GAUZE/BANDAGES/DRESSINGS) ×1 IMPLANT
ELECT REM PT RETURN 9FT ADLT (ELECTROSURGICAL) ×3
ELECTRODE REM PT RTRN 9FT ADLT (ELECTROSURGICAL) ×1 IMPLANT
GLOVE BIOGEL PI IND STRL 7.5 (GLOVE) ×1 IMPLANT
GLOVE BIOGEL PI INDICATOR 7.5 (GLOVE) ×2
GLOVE SURG SS PI 7.5 STRL IVOR (GLOVE) ×3 IMPLANT
GOWN STRL REUS W/ TWL LRG LVL3 (GOWN DISPOSABLE) ×2 IMPLANT
GOWN STRL REUS W/ TWL XL LVL3 (GOWN DISPOSABLE) ×1 IMPLANT
GOWN STRL REUS W/TWL LRG LVL3 (GOWN DISPOSABLE) ×6
GOWN STRL REUS W/TWL XL LVL3 (GOWN DISPOSABLE) ×3
HEMOSTAT SNOW SURGICEL 2X4 (HEMOSTASIS) IMPLANT
KIT BASIN OR (CUSTOM PROCEDURE TRAY) ×3 IMPLANT
KIT TURNOVER KIT B (KITS) ×3 IMPLANT
NS IRRIG 1000ML POUR BTL (IV SOLUTION) ×3 IMPLANT
PACK CV ACCESS (CUSTOM PROCEDURE TRAY) ×3 IMPLANT
PAD ARMBOARD 7.5X6 YLW CONV (MISCELLANEOUS) ×6 IMPLANT
SUT PROLENE 6 0 CC (SUTURE) ×9 IMPLANT
SUT VIC AB 3-0 SH 27 (SUTURE) ×3
SUT VIC AB 3-0 SH 27X BRD (SUTURE) ×1 IMPLANT
SUT VICRYL 4-0 PS2 18IN ABS (SUTURE) ×3 IMPLANT
TOWEL GREEN STERILE (TOWEL DISPOSABLE) ×3 IMPLANT
UNDERPAD 30X30 (UNDERPADS AND DIAPERS) ×3 IMPLANT
WATER STERILE IRR 1000ML POUR (IV SOLUTION) ×3 IMPLANT

## 2019-10-16 NOTE — Progress Notes (Signed)
Attempted to contact the pharmacy to reconcile the medication list. Will verify the list with the pt until the pharmacy can contact him.

## 2019-10-16 NOTE — Interval H&P Note (Signed)
History and Physical Interval Note:  10/16/2019 6:52 AM  Corey Park  has presented today for surgery, with the diagnosis of END STAGE RENAL DISEASE.  The various methods of treatment have been discussed with the patient and family. After consideration of risks, benefits and other options for treatment, the patient has consented to  Procedure(s): RIGHT ARM ARTERIOVENOUS (AV) FISTULA CREATION (Right) as a surgical intervention.  The patient's history has been reviewed, patient examined, no change in status, stable for surgery.  I have reviewed the patient's chart and labs.  Questions were answered to the patient's satisfaction.     [name]

## 2019-10-16 NOTE — Op Note (Addendum)
    Patient name: Corey Park MRN: [id number] DOB: 01-Oct-1970 Sex: male  10/16/2019 Pre-operative Diagnosis: ESRD Post-operative diagnosis:  Same Surgeon:  Malvina New Assistants:  Maurilio Collet Procedure:   Right basilic vein fistula creation Anesthesia: MAC Blood Loss: Minimal Specimens: None  Findings: Cephalic vein did not appear to be usable based on size.  The basilic vein measured 4 mm by ultrasound.  The artery was disease-free  Indications: The patient is left-handed.  He comes in today for fistula creation in the right arm.  Procedure:  The patient was identified in the holding area and taken to [facility] OR ROOM 16  The patient was then placed supine on the table. MAC anesthesia was administered.  The patient was prepped and draped in the usual sterile fashion.  A time out was called and antibiotics were administered.  Ultrasound was used to evaluate the cephalic and basilic vein in the right arm.  The cephalic vein did not appear to be usable based off size.  The basilic vein appeared to be healthy and of adequate caliber.  1% lidocaine  was used for local anesthesia.  A transverse incision was made just proximal to the antecubital crease.  I first dissected out the brachial artery which was a 3.5 mm disease-free artery.  It was encircled with Vesseloops.  Next I exposed the basilic vein.  This was a healthy appearing 4 mm vein.  It was fully mobilized throughout the width of the incision.  It was marked for orientation.  Side branches were ligated between silk ties.  The vein was then ligated distally.  It distended nicely with heparin  saline.  The brachial artery was then occluded.  A #11 blade was used to make an arteriotomy which was extended longitudinally with Potts scissors.  The vein was spatulated but the size of the arteriotomy and a running anastomosis was created with 6-0 Prolene.  Prior to completion the appropriate flushing maneuvers were performed and the anastomosis was  completed.  Clamps were released.  There was an excellent thrill within the fistula.  Patient had multiphasic radial artery Doppler signal that did not change in intensity with compression of the fistula.  The wound was then copiously irrigated.  The incision was closed with a deep layer of 3-0 Vicryl and 4-0 Vicryl on the skin followed by Dermabond.   Disposition: To PACU stable.   ALONSO Malvina New, M.D., [facility] Ve[facility] Specialists of [place] Office: [phone] Pager:  [phone]

## 2019-10-16 NOTE — Discharge Instructions (Signed)
° °  [facility] ° °Discharge Instructions ° °AV Fistula or Graft Surgery for Dialysis Access ° °Please refer to the following instructions for your post-procedure care. Your surgeon or physician assistant will discuss any changes with you. ° °Activity ° °You may drive the day following your surgery, if you are comfortable and no longer taking prescription pain medication. Resume full activity as the soreness in your incision resolves. ° °Bathing/Showering ° °You may shower after you go home. Keep your incision dry for 48 hours. Do not soak in a bathtub, hot tub, or swim until the incision heals completely. You may not shower if you have a hemodialysis catheter. ° °Incision Care ° °Clean your incision with mild soap and water after 48 hours. Pat the area dry with a clean towel. You do not need a bandage unless otherwise instructed. Do not apply any ointments or creams to your incision. You may have skin glue on your incision. Do not peel it off. It will come off on its own in about one week. Your arm may swell a bit after surgery. To reduce swelling use pillows to elevate your arm so it is above your heart. Your doctor will tell you if you need to lightly wrap your arm with an ACE bandage. ° °Diet ° °Resume your normal diet. There are not special food restrictions following this procedure. In order to heal from your surgery, it is CRITICAL to get adequate nutrition. Your body requires vitamins, minerals, and protein. Vegetables are the best source of vitamins and minerals. Vegetables also provide the perfect balance of protein. Processed food has little nutritional value, so try to avoid this. ° °Medications ° °Resume taking all of your medications. If your incision is causing pain, you may take over-the counter pain relievers such as acetaminophen (Tylenol). If you were prescribed a stronger pain medication, please be aware these medications can cause nausea and constipation. Prevent  nausea by taking the medication with a snack or meal. Avoid constipation by drinking plenty of fluids and eating foods with high amount of fiber, such as fruits, vegetables, and grains. Do not take Tylenol if you are taking prescription pain medications. ° ° ° ° °Follow up °Your surgeon may want to see you in the office following your access surgery. If so, this will be arranged at the time of your surgery. ° °Please call us immediately for any of the following conditions: ° °Increased pain, redness, drainage (pus) from your incision site °Fever of 101 degrees or higher °Severe or worsening pain at your incision site °Hand pain or numbness. ° °Reduce your risk of vascular disease: ° °Stop smoking. If you would like help, call [facility] at 1-800-QUIT-NOW (1-800-784-8669) or [facility] at 336-586-4000 ° °Manage your cholesterol °Maintain a desired weight °Control your diabetes °Keep your blood pressure down ° °Dialysis ° °It will take several weeks to several months for your new dialysis access to be ready for use. Your surgeon will determine when it is OK to use it. Your nephrologist will continue to direct your dialysis. You can continue to use your Permcath until your new access is ready for use. ° °If you have any questions, please call the office at [phone]. ° °

## 2019-10-16 NOTE — Transfer of Care (Signed)
Immediate Anesthesia Transfer of Care Note  Patient: Corey Park  Procedure(s) Performed: Basilic Vein Transposition Right Arm (Right )  Patient Location: PACU  Anesthesia Type:MAC  Level of Consciousness: awake, alert  and oriented  Airway & Oxygen Therapy: Patient Spontanous Breathing  Post-op Assessment: Report given to RN and Post -op Vital signs reviewed and stable  Post vital signs: Reviewed and stable  Last Vitals:  Vitals Value Taken Time  BP 130/57 10/16/19 0913  Temp 36.7 C 10/16/19 0910  Pulse    Resp 17 10/16/19 0913  SpO2 100 % 10/16/19 0913  Vitals shown include unvalidated device data.  Last Pain:  Vitals:   10/16/19 0910  PainSc: 0-No pain         Complications: No apparent anesthesia complications

## 2019-10-16 NOTE — Anesthesia Postprocedure Evaluation (Signed)
Anesthesia Post Note  Patient: Corey Park  Procedure(s) Performed: Basilic Vein Transposition Right Arm (Right )     Anesthesia Post Evaluation  Last Vitals:  Vitals:   10/16/19 0910 10/16/19 0913  BP: 111/77 (!) 130/57  Pulse:    Resp:    Temp: 36.7 C   SpO2: 100% 100%    Last Pain:  Vitals:   10/16/19 0910  PainSc: 0-No pain                 Catalina Gravel

## 2019-10-16 NOTE — Anesthesia Procedure Notes (Signed)
Procedure Name: MAC Date/Time: 10/16/2019 7:30 AM Performed by: Griffin Dakin, CRNA Pre-anesthesia Checklist: Patient identified, Emergency Drugs available, Suction available and Patient being monitored Patient Re-evaluated:Patient Re-evaluated prior to induction Oxygen Delivery Method: Simple face mask Induction Type: IV induction Airway Equipment and Method: Oral airway Placement Confirmation: positive ETCO2 Dental Injury: Teeth and Oropharynx as per pre-operative assessment

## 2019-10-17 DIAGNOSIS — R52 Pain, unspecified: Secondary | ICD-10-CM

## 2019-10-17 HISTORY — DX: Pain, unspecified: R52

## 2019-11-10 DIAGNOSIS — R0602 Shortness of breath: Secondary | ICD-10-CM | POA: Insufficient documentation

## 2019-11-10 HISTORY — DX: Shortness of breath: R06.02

## 2019-11-11 ENCOUNTER — Other Ambulatory Visit: Payer: Self-pay | Admitting: *Deleted

## 2019-11-11 DIAGNOSIS — Z992 Dependence on renal dialysis: Secondary | ICD-10-CM

## 2019-11-14 ENCOUNTER — Telehealth (HOSPITAL_COMMUNITY): Payer: Self-pay

## 2019-11-14 NOTE — Telephone Encounter (Signed)

## 2019-11-17 ENCOUNTER — Ambulatory Visit (HOSPITAL_COMMUNITY): Payer: Medicaid Other | Attending: Surgery

## 2019-11-26 ENCOUNTER — Emergency Department (HOSPITAL_COMMUNITY): Payer: Medicare Other

## 2019-11-26 ENCOUNTER — Inpatient Hospital Stay (HOSPITAL_COMMUNITY)
Admission: EM | Admit: 2019-11-26 | Discharge: 2019-11-28 | DRG: 602 | Disposition: A | Discharge status: Home / Self Care | Payer: Medicare Other | Attending: Internal Medicine | Admitting: Internal Medicine

## 2019-11-26 ENCOUNTER — Other Ambulatory Visit: Payer: Self-pay

## 2019-11-26 DIAGNOSIS — I10 Essential (primary) hypertension: Secondary | ICD-10-CM | POA: Diagnosis not present

## 2019-11-26 DIAGNOSIS — Z8616 Personal history of COVID-19: Secondary | ICD-10-CM

## 2019-11-26 DIAGNOSIS — D631 Anemia in chronic kidney disease: Secondary | ICD-10-CM | POA: Diagnosis present

## 2019-11-26 DIAGNOSIS — N2581 Secondary hyperparathyroidism of renal origin: Secondary | ICD-10-CM | POA: Diagnosis present

## 2019-11-26 DIAGNOSIS — E1122 Type 2 diabetes mellitus with diabetic chronic kidney disease: Secondary | ICD-10-CM | POA: Diagnosis present

## 2019-11-26 DIAGNOSIS — N186 End stage renal disease: Secondary | ICD-10-CM | POA: Diagnosis present

## 2019-11-26 DIAGNOSIS — I5022 Chronic systolic (congestive) heart failure: Secondary | ICD-10-CM | POA: Diagnosis present

## 2019-11-26 DIAGNOSIS — H547 Unspecified visual loss: Secondary | ICD-10-CM | POA: Diagnosis present

## 2019-11-26 DIAGNOSIS — Z992 Dependence on renal dialysis: Secondary | ICD-10-CM | POA: Diagnosis not present

## 2019-11-26 DIAGNOSIS — J45909 Unspecified asthma, uncomplicated: Secondary | ICD-10-CM | POA: Diagnosis present

## 2019-11-26 DIAGNOSIS — D649 Anemia, unspecified: Secondary | ICD-10-CM | POA: Diagnosis present

## 2019-11-26 DIAGNOSIS — E8889 Other specified metabolic disorders: Secondary | ICD-10-CM | POA: Diagnosis present

## 2019-11-26 DIAGNOSIS — N5082 Scrotal pain: Secondary | ICD-10-CM | POA: Diagnosis present

## 2019-11-26 DIAGNOSIS — E1129 Type 2 diabetes mellitus with other diabetic kidney complication: Secondary | ICD-10-CM | POA: Diagnosis not present

## 2019-11-26 DIAGNOSIS — Z8249 Family history of ischemic heart disease and other diseases of the circulatory system: Secondary | ICD-10-CM | POA: Diagnosis not present

## 2019-11-26 DIAGNOSIS — N492 Inflammatory disorders of scrotum: Secondary | ICD-10-CM

## 2019-11-26 DIAGNOSIS — I132 Hypertensive heart and chronic kidney disease with heart failure and with stage 5 chronic kidney disease, or end stage renal disease: Secondary | ICD-10-CM | POA: Diagnosis present

## 2019-11-26 DIAGNOSIS — E875 Hyperkalemia: Secondary | ICD-10-CM | POA: Diagnosis present

## 2019-11-26 DIAGNOSIS — Z79891 Long term (current) use of opiate analgesic: Secondary | ICD-10-CM

## 2019-11-26 DIAGNOSIS — L03315 Cellulitis of perineum: Principal | ICD-10-CM | POA: Diagnosis present

## 2019-11-26 DIAGNOSIS — Z79899 Other long term (current) drug therapy: Secondary | ICD-10-CM | POA: Diagnosis not present

## 2019-11-26 DIAGNOSIS — R809 Proteinuria, unspecified: Secondary | ICD-10-CM | POA: Diagnosis not present

## 2019-11-26 HISTORY — DX: Cellulitis of perineum: L03.315

## 2019-11-26 LAB — COMPREHENSIVE METABOLIC PANEL
ALT: 13 U/L (ref 0–44)
AST: 15 U/L (ref 15–41)
Albumin: 3.5 g/dL (ref 3.5–5.0)
Alkaline Phosphatase: 54 U/L (ref 38–126)
Anion gap: 15 (ref 5–15)
BUN: 63 mg/dL — ABNORMAL HIGH (ref 6–20)
CO2: 29 mmol/L (ref 22–32)
Calcium: 9.2 mg/dL (ref 8.9–10.3)
Chloride: 91 mmol/L — ABNORMAL LOW (ref 98–111)
Creatinine, Ser: 14.53 mg/dL — ABNORMAL HIGH (ref 0.61–1.24)
GFR calc Af Amer: 4 mL/min — ABNORMAL LOW (ref >60)
GFR calc non Af Amer: 3 mL/min — ABNORMAL LOW (ref >60)
Glucose, Bld: 124 mg/dL — ABNORMAL HIGH (ref 70–99)
Potassium: 5.5 mmol/L — ABNORMAL HIGH (ref 3.5–5.1)
Sodium: 135 mmol/L (ref 135–145)
Total Bilirubin: 0.9 mg/dL (ref 0.3–1.2)
Total Protein: 7.8 g/dL (ref 6.5–8.1)

## 2019-11-26 LAB — CBC WITH DIFFERENTIAL/PLATELET
Abs Immature Granulocytes: 0.05 10*3/uL (ref 0.00–0.07)
Basophils Absolute: 0 10*3/uL (ref 0.0–0.1)
Basophils Relative: 0 %
Eosinophils Absolute: 0.2 10*3/uL (ref 0.0–0.5)
Eosinophils Relative: 2 %
HCT: 36.6 % — ABNORMAL LOW (ref 39.0–52.0)
Hemoglobin: 12 g/dL — ABNORMAL LOW (ref 13.0–17.0)
Immature Granulocytes: 0 %
Lymphocytes Relative: 12 %
Lymphs Abs: 1.5 10*3/uL (ref 0.7–4.0)
MCH: 31.9 pg (ref 26.0–34.0)
MCHC: 32.8 g/dL (ref 30.0–36.0)
MCV: 97.3 fL (ref 80.0–100.0)
Monocytes Absolute: 1.4 10*3/uL — ABNORMAL HIGH (ref 0.1–1.0)
Monocytes Relative: 11 %
Neutro Abs: 9.7 10*3/uL — ABNORMAL HIGH (ref 1.7–7.7)
Neutrophils Relative %: 75 %
Platelets: 196 10*3/uL (ref 150–400)
RBC: 3.76 MIL/uL — ABNORMAL LOW (ref 4.22–5.81)
RDW: 15.9 % — ABNORMAL HIGH (ref 11.5–15.5)
WBC: 12.8 10*3/uL — ABNORMAL HIGH (ref 4.0–10.5)
nRBC: 0 % (ref 0.0–0.2)

## 2019-11-26 LAB — SARS CORONAVIRUS 2 BY RT PCR (HOSPITAL ORDER, PERFORMED IN ~~LOC~~ HOSPITAL LAB): SARS Coronavirus 2: NEGATIVE

## 2019-11-26 MED ORDER — HYDRALAZINE HCL 25 MG PO TABS
25.0000 mg | ORAL_TABLET | Freq: Two times a day (BID) | ORAL | Status: DC
  Filled 2019-11-26: qty 1

## 2019-11-26 MED ORDER — LIDOCAINE-PRILOCAINE 2.5-2.5 % EX CREA: 1.0000 "application " | TOPICAL_CREAM | CUTANEOUS | Status: DC | PRN

## 2019-11-26 MED ORDER — GABAPENTIN 100 MG PO CAPS: 100.0000 mg | ORAL_CAPSULE | Freq: Two times a day (BID) | ORAL | Status: DC

## 2019-11-26 MED ORDER — VANCOMYCIN HCL 2000 MG/400ML IV SOLN
2000.0000 mg | Freq: Once | INTRAVENOUS | Status: AC
  Administered 2019-11-26: 2000 mg via INTRAVENOUS
  Filled 2019-11-26: qty 400

## 2019-11-26 MED ORDER — OXYCODONE-ACETAMINOPHEN 5-325 MG PO TABS: 1.0000 | ORAL_TABLET | Freq: Four times a day (QID) | ORAL | Status: DC | PRN

## 2019-11-26 MED ORDER — CEFAZOLIN SODIUM-DEXTROSE 1-4 GM/50ML-% IV SOLN
1.0000 g | INTRAVENOUS | Status: DC
  Administered 2019-11-27: 1 g via INTRAVENOUS
  Filled 2019-11-26 (×2): qty 50

## 2019-11-26 MED ORDER — ALUM & MAG HYDROXIDE-SIMETH 200-200-20 MG/5ML PO SUSP
30.0000 mL | ORAL | Status: DC | PRN
  Administered 2019-11-26: 30 mL via ORAL
  Filled 2019-11-26: qty 30

## 2019-11-26 MED ORDER — PANTOPRAZOLE SODIUM 20 MG PO TBEC: 20.0000 mg | DELAYED_RELEASE_TABLET | Freq: Two times a day (BID) | ORAL | Status: DC

## 2019-11-26 MED ORDER — ALTEPLASE 2 MG IJ SOLR: 2.0000 mg | Freq: Once | INTRAMUSCULAR | Status: DC | PRN

## 2019-11-26 MED ORDER — SODIUM CHLORIDE 0.9 % IV SOLN: 100.0000 mL | INTRAVENOUS | Status: DC | PRN

## 2019-11-26 MED ORDER — ATORVASTATIN CALCIUM 10 MG PO TABS
20.0000 mg | ORAL_TABLET | Freq: Every day | ORAL | Status: DC
  Administered 2019-11-27: 20 mg via ORAL
  Filled 2019-11-26 (×2): qty 2

## 2019-11-26 MED ORDER — HEPARIN SODIUM (PORCINE) 1000 UNIT/ML DIALYSIS: 3000.0000 [IU] | Freq: Once | INTRAMUSCULAR | Status: AC

## 2019-11-26 MED ORDER — POLYETHYLENE GLYCOL 3350 17 G PO PACK
17.0000 g | PACK | Freq: Every day | ORAL | Status: DC | PRN
  Administered 2019-11-27: 17 g via ORAL
  Filled 2019-11-26: qty 1

## 2019-11-26 MED ORDER — HYDRALAZINE HCL 25 MG PO TABS: 50.0000 mg | ORAL_TABLET | Freq: Three times a day (TID) | ORAL | Status: DC

## 2019-11-26 MED ORDER — IOHEXOL 300 MG/ML  SOLN: 100.0000 mL | Freq: Once | INTRAMUSCULAR | Status: DC | PRN

## 2019-11-26 MED ORDER — LIDOCAINE HCL (PF) 1 % IJ SOLN: 5.0000 mL | INTRAMUSCULAR | Status: DC | PRN

## 2019-11-26 MED ORDER — CHLORHEXIDINE GLUCONATE CLOTH 2 % EX PADS
6.0000 | MEDICATED_PAD | Freq: Every day | CUTANEOUS | Status: DC
  Administered 2019-11-27: 6 via TOPICAL

## 2019-11-26 MED ORDER — PENTAFLUOROPROP-TETRAFLUOROETH EX AERO: 1.0000 "application " | INHALATION_SPRAY | CUTANEOUS | Status: DC | PRN

## 2019-11-26 MED ORDER — SODIUM CHLORIDE (PF) 0.9 % IJ SOLN
INTRAMUSCULAR | Status: AC
  Filled 2019-11-26: qty 50

## 2019-11-26 MED ORDER — BISACODYL 10 MG RE SUPP: 10.0000 mg | Freq: Every day | RECTAL | Status: DC | PRN

## 2019-11-26 MED ORDER — ONDANSETRON HCL 4 MG/2ML IJ SOLN: 4.0000 mg | Freq: Four times a day (QID) | INTRAMUSCULAR | Status: DC | PRN

## 2019-11-26 MED ORDER — ENOXAPARIN SODIUM 30 MG/0.3ML ~~LOC~~ SOLN
30.0000 mg | SUBCUTANEOUS | Status: DC
  Administered 2019-11-27: 30 mg via SUBCUTANEOUS
  Filled 2019-11-26 (×2): qty 0.3

## 2019-11-26 MED ORDER — SODIUM ZIRCONIUM CYCLOSILICATE 10 G PO PACK
10.0000 g | PACK | Freq: Once | ORAL | Status: AC
  Administered 2019-11-26: 10 g via ORAL
  Filled 2019-11-26: qty 1

## 2019-11-26 MED ORDER — CARVEDILOL 25 MG PO TABS
25.0000 mg | ORAL_TABLET | Freq: Two times a day (BID) | ORAL | Status: DC
  Administered 2019-11-26 – 2019-11-27 (×2): 25 mg via ORAL
  Filled 2019-11-26: qty 2
  Filled 2019-11-26 (×2): qty 1

## 2019-11-26 MED ORDER — DOCUSATE SODIUM 100 MG PO CAPS
100.0000 mg | ORAL_CAPSULE | Freq: Two times a day (BID) | ORAL | Status: DC
  Administered 2019-11-27: 100 mg via ORAL
  Filled 2019-11-26 (×2): qty 1

## 2019-11-26 MED ORDER — ONDANSETRON HCL 4 MG PO TABS: 4.0000 mg | ORAL_TABLET | Freq: Four times a day (QID) | ORAL | Status: DC | PRN

## 2019-11-26 MED ORDER — HEPARIN SODIUM (PORCINE) 1000 UNIT/ML DIALYSIS: 1000.0000 [IU] | INTRAMUSCULAR | Status: DC | PRN

## 2019-11-26 NOTE — ED Provider Notes (Signed)
Corey Park   CSN: [id number] Arrival date & time: 11/26/19  1107     History Chief Complaint  Patient presents with  . Cyst    Corey Park is a 49 y.o. male.  HPI     Corey Park is a 49 y.o. male, with a history of blindness, CHF, type II DM, HTN, dialysis, presenting to the ED with painful mass and swelling in the area of the genitalia noted on the last couple days. He states the swelling and pain has been worsening over that time.  Patient is on a Monday, Wednesday, Friday dialysis schedule last dialysis Monday, May 31.  He is scheduled for dialysis today at 4 PM.  He still produces urine. Denies fever/chills, abdominal pain, dysuria, difficulty urinating, difficulty or pain with bowel movements, or any other complaints.   Past Medical History:  Diagnosis Date  . Anemia 2021  . Asthma    as a child  . Blind   . CHF (congestive heart failure) ([place]) 2021  . Diabetes mellitus without complication ([place])    type 2  . Hypertension   . Pneumonia 2021  . Renal disorder    dialysis T-TH-Sat    Patient Active Problem List   Diagnosis Date Noted  . Cellulitis, perineum 11/26/2019  . Acute CHF (congestive heart failure) ([place]) 07/27/2019  . COVID-19 virus infection 07/27/2019  . Anemia in chronic kidney disease (CKD) 12/23/2018  . Fe deficiency anemia 12/23/2018  . Acute renal failure superimposed on stage 5 chronic kidney disease, not on chronic dialysis ([place])   . Symptomatic anemia 12/21/2018  . Chronic systolic heart failure ([place]) 07/03/2018  . Dilated cardiomyopathy ([place]) 07/03/2018  . Elevated troponin   . Chronic kidney disease, stage V ([place]) 06/17/2018  . Hypomagnesemia 12/13/2016  . Hypertensive urgency 12/11/2016  . AKI (acute kidney injury) ([place]) 12/11/2016  . Fluid overload 12/11/2016  . HLD (hyperlipidemia) 06/14/2007  . DM (diabetes mellitus), type 2 ([place]) 06/11/2007  . Hypertensive heart disease  with CHF (congestive heart failure) ([place]) 06/11/2007    Past Surgical History:  Procedure Laterality Date  . BASCILIC VEIN TRANSPOSITION Right 10/16/2019   Procedure: Basilic Vein Transposition Right Arm;  Surgeon: Serafina Mitchell, MD;  Location: [facility];  Service: Vascular;  Laterality: Right;  . BIOPSY  12/22/2018   Procedure: BIOPSY;  Surgeon: Laurence Spates, MD;  Location: [facility];  Service: Endoscopy;;  . ESOPHAGOGASTRODUODENOSCOPY N/A 12/22/2018   Procedure: ESOPHAGOGASTRODUODENOSCOPY (EGD);  Surgeon: Laurence Spates, MD;  [facility] END[facility]COPY;  Service: Endoscopy;  Laterality: N/A;  . IR FLUORO GUIDE CV LINE RIGHT  07/29/2019  . IR US GUIDE VASC ACCESS RIGHT  07/29/2019       Family History  Problem Relation Age of Onset  . CAD Mother   . Hypertension Mother   . Diabetes Neg Hx   . Stroke Neg Hx   . Cancer Neg Hx   . Kidney failure Neg Hx     Social History   Tobacco Use  . Smoking status: Never Smoker  . Smokeless tobacco: Never Used  Substance Use Topics  . Alcohol use: Not Currently  . Drug use: Not Currently    Types: Marijuana    Home Medications Prior to Admission medications   Medication Sig Start Date End Date Taking? Authorizing Provider  atorvastatin (LIPITOR) 20 MG tablet One pill each evening to lower cholesterol 08/03/19   Ghimire, Henreitta Leber, MD  calcium acetate (PHOSLO) 667 MG tablet  Take by mouth. 09/12/19   [provider]  carvedilol (COREG) 25 MG tablet Take 1 tablet (25 mg total) by mouth 2 (two) times daily with a meal. 08/03/19 10/16/19  Ghimire, Henreitta Leber, MD  ferrous sulfate 325 (65 FE) MG tablet Take 1 tablet (325 mg total) by mouth daily. 08/03/19 09/02/19  Ghimire, Henreitta Leber, MD  gabapentin (NEURONTIN) 100 MG capsule Take 1 capsule (100 mg total) by mouth 2 (two) times daily. 08/13/19   Fulp, Cammie, MD  hydrALAZINE (APRESOLINE) 50 MG tablet Take 1 tablet (50 mg total) by mouth 3 (three) times daily. 08/03/19 10/16/19  Ghimire, Henreitta Leber, MD    multivitamin (RENA-VIT) TABS tablet Take 1 tablet by mouth daily. 08/29/19   [provider]  oxyCODONE-acetaminophen (PERCOCET/ROXICET) 5-325 MG tablet Take 1 tablet by mouth every 6 (six) hours as needed. 10/16/19   Ulyses Amor, PA-C  pantoprazole (PROTONIX) 20 MG tablet Take 1 tablet (20 mg total) by mouth 2 (two) times daily. 08/13/19   Antony Blackbird, MD    Allergies    Patient has no known allergies.  Review of Systems   Review of Systems  Constitutional: Negative for chills, diaphoresis and fever.  Respiratory: Negative for shortness of breath.   Cardiovascular: Negative for chest pain.  Gastrointestinal: Negative for abdominal pain, nausea and vomiting.  Genitourinary: Positive for scrotal swelling. Negative for dysuria, flank pain, penile pain, penile swelling and testicular pain.  Musculoskeletal: Negative for back pain.  Neurological: Negative for weakness.  All other systems reviewed and are negative.   Physical Exam Updated Vital Signs BP 124/69 (BP Location: Left Arm)   Pulse 78   Temp 99.1 F (37.3 C) (Oral)   Resp 16   SpO2 100%   Physical Exam Vitals and nursing Park reviewed.  Constitutional:      General: He is not in acute distress.    Appearance: He is well-developed. He is not diaphoretic.  HENT:     Head: Normocephalic and atraumatic.     Mouth/Throat:     Mouth: Mucous membranes are moist.     Pharynx: Oropharynx is clear.  Eyes:     Conjunctiva/sclera: Conjunctivae normal.  Cardiovascular:     Rate and Rhythm: Normal rate and regular rhythm.     Pulses: Normal pulses.          Radial pulses are 2+ on the right side and 2+ on the left side.       Posterior tibial pulses are 2+ on the right side and 2+ on the left side.     Heart sounds: Normal heart sounds.     Comments: Tactile temperature in the extremities appropriate and equal bilaterally. Pulmonary:     Effort: Pulmonary effort is normal. No respiratory distress.     Breath  sounds: Normal breath sounds.  Abdominal:     Palpations: Abdomen is soft.     Tenderness: There is no abdominal tenderness. There is no guarding.  Genitourinary:    Comments: Area of swelling, tenderness, and fluctuance to the perineum extending towards the rectum as well as into the skin of the scrotum.  Swelling and tenderness continues to track along the left side of the scrotum and continues into the left inguinal region. No other areas of tenderness, erythema, or swelling noted to the scrotum, penis, or other areas of the groin. Musculoskeletal:     Cervical back: Neck supple.     Right lower leg: No edema.     Left  lower leg: No edema.  Lymphadenopathy:     Cervical: No cervical adenopathy.  Skin:    General: Skin is warm and dry.  Neurological:     Mental Status: He is alert.  Psychiatric:        Mood and Affect: Mood and affect normal.        Speech: Speech normal.        Behavior: Behavior normal.     ED Results / Procedures / Treatments   Labs (all labs ordered are listed, but only abnormal results are displayed) Labs Reviewed  COMPREHENSIVE METABOLIC PANEL - Abnormal; Notable for the following components:      Result Value   Potassium 5.5 (*)    Chloride 91 (*)    Glucose, Bld 124 (*)    BUN 63 (*)    Creatinine, Ser 14.53 (*)    GFR calc non Af Amer 3 (*)    GFR calc Af Amer 4 (*)    All other components within normal limits  CBC WITH DIFFERENTIAL/PLATELET - Abnormal; Notable for the following components:   WBC 12.8 (*)    RBC 3.76 (*)    Hemoglobin 12.0 (*)    HCT 36.6 (*)    RDW 15.9 (*)    Neutro Abs 9.7 (*)    Monocytes Absolute 1.4 (*)    All other components within normal limits  SARS CORONAVIRUS 2 BY RT PCR (HOSPITAL ORDER, [facility])  CULTURE, BLOOD (ROUTINE X 2)  CULTURE, BLOOD (ROUTINE X 2)   Hemoglobin  Date Value Ref Range Status  11/26/2019 12.0 (L) 13.0 - 17.0 g/dL Final  10/16/2019 13.6 13.0 - 17.0 g/dL  Final  08/03/2019 8.6 (L) 13.0 - 17.0 g/dL Final  08/02/2019 8.4 (L) 13.0 - 17.0 g/dL Final  01/16/2019 9.5 (L) 13.0 - 17.7 g/dL Final    EKG EKG Interpretation  Date/Time:  Wednesday November 26 2019 16:44:52 EDT Ventricular Rate:  83 PR Interval:    QRS Duration: 90 QT Interval:  387 QTC Calculation: 455 R Axis:   59 Text Interpretation: Sinus rhythm Repol abnrm, global ischemia, diffuse leads 12 Lead; [place] When compared to priorl more t wave inversion i nleads 2,3,AVF. No STEMI Confirmed by Antony Blackbird [phone]) on 11/26/2019 5:29:02 PM   Radiology CT ABDOMEN PELVIS WO CONTRAST  Result Date: 11/26/2019 CLINICAL DATA:  Scrotal swelling or edema.  Peri medium swelling. EXAM: CT ABDOMEN AND PELVIS WITHOUT CONTRAST TECHNIQUE: Multidetector CT imaging of the abdomen and pelvis was performed following the standard protocol without IV contrast. COMPARISON:  Ultrasound same day FINDINGS: Lower chest: Lung bases are clear. Hepatobiliary: No focal hepatic lesion. No biliary duct dilatation. Gallbladder is normal. Common bile duct is normal. Pancreas: Pancreas is normal. No ductal dilatation. No pancreatic inflammation. Spleen: Normal spleen Adrenals/urinary tract: Adrenal glands and kidneys are normal. The ureters and bladder normal. Stomach/Bowel: Stomach, small bowel, appendix, and cecum are normal. The colon and rectosigmoid colon are normal. Vascular/Lymphatic: Abdominal aorta is normal caliber. No periportal or retroperitoneal adenopathy. No pelvic adenopathy. Reproductive: Within the LEFT perineum there is a subcutaneous a fluid collection measuring 4.5 1.5 2.9 cm (image 112/2. There is subcutaneous stranding which extends towards the LEFT scrotal sac. This peritoneal collection does not communicate with the anus. Collection does not appear to extend into the peritoneal spacer rectum pararectal fat. There is no gas within the perineum. Entirety of the scrotum is not imaged. Other: No free  fluid. Musculoskeletal: No aggressive  osseous lesion. IMPRESSION: 1. Subcutaneous fluid collection in LEFT perineum most consistent cellulitis with potential early abscess formation. This fluid collection is relatively small. No evidence of communication with the anus to suggest perianal fistula. There is some adjacent subcutaneous stranding within the base the penis and scrotum. 2. No gas within perineum identified. 3. No evidence of extension into the peritoneal space or retroperitoneal space. Infection/inflammation appears localized in the cutaneous tissue. Electronically Signed   By: Suzy Bouchard M.D.   On: 11/26/2019 16:01   US SCROTUM W/DOPPLER  Result Date: 11/26/2019 CLINICAL DATA:  Pain and swelling in the left perineum. EXAM: SCROTAL ULTRASOUND DOPPLER ULTRASOUND OF THE TESTICLES TECHNIQUE: Complete ultrasound examination of the testicles, epididymis, and other scrotal structures was performed. Color and spectral Doppler ultrasound were also utilized to evaluate blood flow to the testicles. COMPARISON:  None. FINDINGS: Right testicle Measurements: 4.0 x 2.3 x 2.9 cm. No mass or microlithiasis visualized. Left testicle Measurements: 4.4 x 2.0 x 3.0 cm. No mass or microlithiasis visualized. Right epididymis:  Normal in size and appearance. Left epididymis: There are 2 benign-appearing cysts in the head of the left epididymis, 10 mm and 6 mm. Hydrocele:  None visualized. Varicocele:  None visualized. Pulsed Doppler interrogation of both testes demonstrates normal low resistance arterial and venous waveforms bilaterally. There are slightly prominent reactive lymph nodes in the left inguinal region. There is subcutaneous edema in the soft tissues in the area of concern in the perineum without a definable abscess. This is consistent with cellulitis. IMPRESSION: Subcutaneous edema in the perineum consistent with cellulitis. No definable abscess. CT scan of the pelvis with intravenous contrast may be useful  to determine the extent of the infection and the possibility of a deeper abscess that is not detectable by ultrasound. Normal appearing testicles. Electronically Signed   By: Lorriane Shire M.D.   On: 11/26/2019 14:10    Procedures Procedures (including critical care time)  Medications Ordered in ED Medications  iohexol (OMNIPAQUE) 300 MG/ML solution 100 mL ( Intravenous Canceled Entry 11/26/19 1514)  sodium chloride (PF) 0.9 % injection (  Canceled Entry 11/26/19 1504)  vancomycin (VANCOREADY) IVPB 2000 mg/400 mL (has no administration in time range)  atorvastatin (LIPITOR) tablet 20 mg (has no administration in time range)  carvedilol (COREG) tablet 25 mg (has no administration in time range)  gabapentin (NEURONTIN) capsule 100 mg (has no administration in time range)  hydrALAZINE (APRESOLINE) tablet 50 mg (has no administration in time range)  oxyCODONE-acetaminophen (PERCOCET/ROXICET) 5-325 MG per tablet 1 tablet (has no administration in time range)  pantoprazole (PROTONIX) EC tablet 20 mg (has no administration in time range)  sodium zirconium cyclosilicate (LOKELMA) packet 10 g (has no administration in time range)    ED Course  I have reviewed the triage vital signs and the nursing notes.  Pertinent labs & imaging results that were available during my care of the patient were reviewed by me and considered in my medical decision making (see chart for details).  Clinical Course as of Nov 25 1737  Wed Nov 26, 2019  1507 Spoke with CT. They states since patient has been on dialysis less than 6 months, they would need special permission to perform CT with IV contrast. We opted instead to change the scan to a noncontrast CT.   [SJ]  78 Spoke with Dr. Louanne Belton, hospitalist. Agrees to admit the patient.    [SJ]  1733 Spoke with Dr. Hollie Salk, nephrologist.  Requests 10 g Lokelma. She will  coordinate dialysis for the patient.   [SJ]  74 Update Dr. Louanne Belton on conversation with nephrology.    [SJ]    Clinical Course User Index [SJ] Suanne Minahan, Helane Gunther, PA-C   MDM Rules/Calculators/A&P                      Patient presents with pain to the perineum and scrotum. Patient is nontoxic appearing, afebrile, not tachycardic, not tachypneic, not hypotensive, maintains excellent SPO2 on room air, and is in no apparent distress.   I have reviewed the patient's chart to obtain more information.  I reviewed and interpreted the patient's labs and radiological studies. Mild leukocytosis. Evidence of cellulitis to the perineum and scrotal wall.  No definite abscess. Fournier's gangrene was considered, but thought less likely at this stage due to the patient's overall presentation.  However, due to this CT finding in a dialysis patient, we decided to admit the patient for IV antibiotics and close observation.   Findings and plan of care discussed with Antony Blackbird, MD.   Vitals:   11/26/19 1119 11/26/19 1645  BP: 124/69 130/76  Pulse: 78 79  Resp: 16 14  Temp: 99.1 F (37.3 C)   TempSrc: Oral   SpO2: 100% 100%     Final Clinical Impression(s) / ED Diagnoses Final diagnoses:  Cellulitis of perineum  Cellulitis of scrotum    Rx / DC Orders ED Discharge Orders    None       Layla Maw 11/26/19 1742    Tegeler, Gwenyth Allegra, MD 11/26/19 1749

## 2019-11-26 NOTE — ED Triage Notes (Signed)
Transported by [facility] for cyst to right upper thigh. Patient noticed cyst 2 days ago. Denies fever, chills.

## 2019-11-26 NOTE — H&P (Addendum)
[facility] History and Physical  Corey Park JJO:[id number] DOB: 18-Jul-1970 DOA: 11/26/2019  Referring physician: ED  PCP: Antony Blackbird, MD   Patient is coming from: Home  Chief Complaint: perineal pain  HPI: Corey Park is a 49 y.o. male with past medical history of CHF, diabetes mellitus, end-stage renal disease on hemodialysis Monday, wed, friday, anemia, blindness presented to hospital with complains of painful mass and swelling in the genital area for the last 2 or 3 days with recently increased  swelling and pain.  Patient denies any trauma to the part.  Patient does have history of End-stage renal disease disease on hemodialysis Monday Wednesday Friday schedule and is due for dialysis today.  He has been started on hemodialysis since February 2021 and follows up with nephrology as outpatient.  Patient still makes urine but denies any urinary urgency, frequency or dysuria.  No fever chills or rigor at home but he states that he has pain in the groin whenever he tries to move.  Denies any abdominal pain, distention, nausea, vomiting.  Denies any dizziness, lightheadedness or syncope.  Denies any chest pain, palpitation, cough or shortness of breath.    ED Course: In the ED, patient was noted to be afebrile but had leukocytosis.  CT scan of the abdomen and pelvis showed subcutaneous fluid collection in the left perineum consistent with cellulitis with potential early abscess formation.  Relatively very small.  No communication with the anal area.  No gas noted.  Due to location of cellulitis and the patient being on dialysis and diabetes, he was considered for IV antibiotics.  Review of Systems:  All systems were reviewed and were negative unless otherwise mentioned in the HPI  Past Medical History:  Diagnosis Date  . Anemia 2021  . Asthma    as a child  . Blind   . CHF (congestive heart failure) ([place]) 2021  . Diabetes mellitus without complication ([place])    type 2  .  Hypertension   . Pneumonia 2021  . Renal disorder    dialysis Monday Wednesday Friday   Past Surgical History:  Procedure Laterality Date  . BASCILIC VEIN TRANSPOSITION Right 10/16/2019   Procedure: Basilic Vein Transposition Right Arm;  Surgeon: Serafina Mitchell, MD;  Location: [place];  Service: Vascular;  Laterality: Right;  . BIOPSY  12/22/2018   Procedure: BIOPSY;  Surgeon: Laurence Spates, MD;  Location: [place];  Service: Endoscopy;;  . ESOPHAGOGASTRODUODENOSCOPY N/A 12/22/2018   Procedure: ESOPHAGOGASTRODUODENOSCOPY (EGD);  Surgeon: Laurence Spates, MD;  Location[facility] ENDOSCOPY;  Service: Endoscopy;  Laterality: N/A;  . IR FLUORO GUIDE CV LINE RIGHT  07/29/2019  . IR US GUIDE VASC ACCESS RIGHT  07/29/2019    Social History:  reports that he has never smoked. He has never used smokeless tobacco. He reports previous alcohol use. He reports previous drug use. Drug: Marijuana.  No Known Allergies  Family History  Problem Relation Age of Onset  . CAD Mother   . Hypertension Mother   . Diabetes Neg Hx   . Stroke Neg Hx   . Cancer Neg Hx   . Kidney failure Neg Hx      Prior to Admission medications   Medication Sig Start Date End Date Taking? Authorizing Provider  atorvastatin (LIPITOR) 20 MG tablet One pill each evening to lower cholesterol 08/03/19   Ghimire, Henreitta Leber, MD  calcium acetate (PHOSLO) 667 MG tablet Take by mouth. 09/12/19   [provider]  carvedilol (COREG) 25 MG  tablet Take 1 tablet (25 mg total) by mouth 2 (two) times daily with a meal. 08/03/19 10/16/19  Ghimire, Henreitta Leber, MD  ferrous sulfate 325 (65 FE) MG tablet Take 1 tablet (325 mg total) by mouth daily. 08/03/19 09/02/19  Ghimire, Henreitta Leber, MD  gabapentin (NEURONTIN) 100 MG capsule Take 1 capsule (100 mg total) by mouth 2 (two) times daily. 08/13/19   Fulp, Cammie, MD  hydrALAZINE (APRESOLINE) 50 MG tablet Take 1 tablet (50 mg total) by mouth 3 (three) times daily. 08/03/19 10/16/19  Ghimire, Henreitta Leber, MD    multivitamin (RENA-VIT) TABS tablet Take 1 tablet by mouth daily. 08/29/19   [provider]  oxyCODONE-acetaminophen (PERCOCET/ROXICET) 5-325 MG tablet Take 1 tablet by mouth every 6 (six) hours as needed. 10/16/19   Ulyses Amor, PA-C  pantoprazole (PROTONIX) 20 MG tablet Take 1 tablet (20 mg total) by mouth 2 (two) times daily. 08/13/19   Antony Blackbird, MD    Physical Exam: Vitals:   11/26/19 1119 11/26/19 1645  BP: 124/69 130/76  Pulse: 78 79  Resp: 16 14  Temp: 99.1 F (37.3 C)   TempSrc: Oral   SpO2: 100% 100%   Wt Readings from Last 3 Encounters:  10/16/19 95.3 kg  10/13/19 98.9 kg  08/11/19 98.9 kg   There is no height or weight on file to calculate BMI.  General:  Average built, not in obvious distress, blind. HENT: Normocephalic, pupils equally reacting to light and accommodation. Blindness+  No scleral pallor or icterus noted. Oral mucosa is moist.  Chest:  Clear breath sounds.  Diminished breath sounds bilaterally. No crackles or wheezes.  Right chest wall hemodialysis catheter in place. CVS: S1 &S2 heard. No murmur.  Regular rate and rhythm. Abdomen: Soft, nontender, nondistended.  Bowel sounds are heard.  Swelling tenderness on the left side of the perineum including the skin of the scrotum with induration and tenderness.. Extremities: No cyanosis, clubbing or edema.  Peripheral pulses are palpable. Psych: Alert, awake and oriented, normal mood CNS:  No cranial nerve deficits.  Power equal in all extremities.    Skin: Warm and dry.  Perineal area with induration  Labs on Admission:   CBC: Recent Labs  Lab 11/26/19 1400  WBC 12.8*  NEUTROABS 9.7*  HGB 12.0*  HCT 36.6*  MCV 97.3  PLT 623    Basic Metabolic Panel: Recent Labs  Lab 11/26/19 1400  NA 135  K 5.5*  CL 91*  CO2 29  GLUCOSE 124*  BUN 63*  CREATININE 14.53*  CALCIUM 9.2    Liver Function Tests: Recent Labs  Lab 11/26/19 1400  AST 15  ALT 13  ALKPHOS 54  BILITOT 0.9   PROT 7.8  ALBUMIN 3.5   No results for input(s): LIPASE, AMYLASE in the last 168 hours. No results for input(s): AMMONIA in the last 168 hours.  Cardiac Enzymes: No results for input(s): CKTOTAL, CKMB, CKMBINDEX, TROPONINI in the last 168 hours.  BNP (last 3 results) Recent Labs    12/21/18 0141 07/27/19 1851  BNP 553.1* 2,665.5*    ProBNP (last 3 results) No results for input(s): PROBNP in the last 8760 hours.  CBG: No results for input(s): GLUCAP in the last 168 hours.  Lipase   Radiological Exams on Admission: CT ABDOMEN PELVIS WO CONTRAST  Result Date: 11/26/2019 CLINICAL DATA:  Scrotal swelling or edema.  Peri medium swelling. EXAM: CT ABDOMEN AND PELVIS WITHOUT CONTRAST TECHNIQUE: Multidetector CT imaging of the abdomen and pelvis was  performed following the standard protocol without IV contrast. COMPARISON:  Ultrasound same day FINDINGS: Lower chest: Lung bases are clear. Hepatobiliary: No focal hepatic lesion. No biliary duct dilatation. Gallbladder is normal. Common bile duct is normal. Pancreas: Pancreas is normal. No ductal dilatation. No pancreatic inflammation. Spleen: Normal spleen Adrenals/urinary tract: Adrenal glands and kidneys are normal. The ureters and bladder normal. Stomach/Bowel: Stomach, small bowel, appendix, and cecum are normal. The colon and rectosigmoid colon are normal. Vascular/Lymphatic: Abdominal aorta is normal caliber. No periportal or retroperitoneal adenopathy. No pelvic adenopathy. Reproductive: Within the LEFT perineum there is a subcutaneous a fluid collection measuring 4.5 1.5 2.9 cm (image 112/2. There is subcutaneous stranding which extends towards the LEFT scrotal sac. This peritoneal collection does not communicate with the anus. Collection does not appear to extend into the peritoneal spacer rectum pararectal fat. There is no gas within the perineum. Entirety of the scrotum is not imaged. Other: No free fluid. Musculoskeletal: No aggressive  osseous lesion. IMPRESSION: 1. Subcutaneous fluid collection in LEFT perineum most consistent cellulitis with potential early abscess formation. This fluid collection is relatively small. No evidence of communication with the anus to suggest perianal fistula. There is some adjacent subcutaneous stranding within the base the penis and scrotum. 2. No gas within perineum identified. 3. No evidence of extension into the peritoneal space or retroperitoneal space. Infection/inflammation appears localized in the cutaneous tissue. Electronically Signed   By: Suzy Bouchard M.D.   On: 11/26/2019 16:01   US SCROTUM W/DOPPLER  Result Date: 11/26/2019 CLINICAL DATA:  Pain and swelling in the left perineum. EXAM: SCROTAL ULTRASOUND DOPPLER ULTRASOUND OF THE TESTICLES TECHNIQUE: Complete ultrasound examination of the testicles, epididymis, and other scrotal structures was performed. Color and spectral Doppler ultrasound were also utilized to evaluate blood flow to the testicles. COMPARISON:  None. FINDINGS: Right testicle Measurements: 4.0 x 2.3 x 2.9 cm. No mass or microlithiasis visualized. Left testicle Measurements: 4.4 x 2.0 x 3.0 cm. No mass or microlithiasis visualized. Right epididymis:  Normal in size and appearance. Left epididymis: There are 2 benign-appearing cysts in the head of the left epididymis, 10 mm and 6 mm. Hydrocele:  None visualized. Varicocele:  None visualized. Pulsed Doppler interrogation of both testes demonstrates normal low resistance arterial and venous waveforms bilaterally. There are slightly prominent reactive lymph nodes in the left inguinal region. There is subcutaneous edema in the soft tissues in the area of concern in the perineum without a definable abscess. This is consistent with cellulitis. IMPRESSION: Subcutaneous edema in the perineum consistent with cellulitis. No definable abscess. CT scan of the pelvis with intravenous contrast may be useful to determine the extent of the  infection and the possibility of a deeper abscess that is not detectable by ultrasound. Normal appearing testicles. Electronically Signed   By: Lorriane Shire M.D.   On: 11/26/2019 14:10    EKG: Not available for review.  Assessment/Plan Active Problems:   Cellulitis, perineum  Cellulitis of the perineum and scrotal skin.  CT scan does not show any gas or overt abscess at this time but small localized collection.  Patient is afebrile but has mild leukocytosis.  Treatment with IV antibiotics, will consider cellulitis protocol with IV cefazolin for now.  If any suspicion for developing abscess, please change to vancomycin.  Blood cultures have not been sent from the ED.[name]  Closely monitor.  If worsening cellulitis or fluctuance please consider surgical evaluation.  End-stage renal disease on hemodialysis Monday, Wednesday Friday.  Hemodialysis due  for today.  Nephrology has been notified from the ED.  Will be [facility] to [facility].  Need for hemodialysis.  Hyperkalemia.  Will need to undergo hemodialysis today.  Nephrology Dr. Hollie Salk was notified from the ED  Chronic systolic congestive heart failure.  Currently compensated, will be managed with hemodialysis.  Last known ejection fraction of 20 to 25% by echo on 08/03/2019.  History of diabetes mellitus type 2.  Blindness and end-stage renal disease associated with diabetes.  Patient states that he is not on any medication for diabetes at this time and is controlled through diet.  Last hemoglobin A1c of 6.0 of 6.0 on 07/28/2019..  Will repeat A1c at this time.  Continue sliding scale insulin if necessary.  Hypertension.  On Coreg at home.  Will resume when med rec is done.   DVT Prophylaxis: Heparin subcu  Consultant: Nephrology Dr Hollie Salk  Code Status: Full code  Microbiology COVID-19 test pending  Antibiotics: IV vancomycin x1 in the ED.  We will continue with IV cefazolin while in the hospital.  Family Communication:  Patients'  condition and plan of care including tests being ordered have been discussed with the patient  who indicate understanding and agree with the plan.  Status is: Inpatient  Dispo: The patient is from: Home              Anticipated d/c is to: Home.  Patient lives with his son at home.              Anticipated d/c date is: 2-3 days         Severity of Illness: The appropriate patient status for this patient is INPATIENT. Inpatient status is judged to be reasonable and necessary in order to provide the required intensity of service to ensure the patient's safety. The patient's presenting symptoms, physical exam findings, and initial radiographic and laboratory data in the context of their chronic comorbidities is felt to place them at high risk for further clinical deterioration. Furthermore, it is not anticipated that the patient will be medically stable for discharge from the hospital within 2 midnights of admission.   I certify that at the point of admission it is my clinical judgment that the patient will require inpatient hospital care spanning beyond 2 midnights from the point of admission due to high intensity of service, high risk for further deterioration and high frequency of surveillance required.   Signed, Flora Lipps, MD [facility] 11/26/2019

## 2019-11-26 NOTE — Progress Notes (Signed)
A consult was received from an ED physician for Vancomycin per pharmacy dosing.  The patient's profile has been reviewed for ht/wt/allergies/indication/available labs.   A one time order has been placed for Vancomycin 2gm IV x1.  Further antibiotics/pharmacy consults should be ordered by admitting physician if indicated.                       Thank you, Netta Cedars PharmD 11/26/2019  4:50 PM

## 2019-11-26 NOTE — ED Notes (Signed)
Ultrasound at bedside

## 2019-11-26 NOTE — ED Notes (Signed)
Attempted to call report. RN was busy and staff stated she would call back in a few minutes.

## 2019-11-26 NOTE — ED Notes (Signed)
[facility] called for transport to [facility].  

## 2019-11-26 NOTE — ED Notes (Signed)
ED TO INPATIENT HANDOFF REPORT  Name/Age/Gender Corey Park Files 49 y.o. male  Code Status Code Status History    Date Active Date Inactive Code Status Order ID Comments User Context   07/27/2019 2137 08/03/2019 1741 Full Code [id number]  Orene Desanctis, DO ED   12/21/2018 0514 12/24/2018 2231 Full Code [id number]  Elwyn Reach, MD ED   06/16/2018 1904 06/19/2018 1610 Full Code [id number]  Colbert Ewing, MD ED   12/12/2016 0210 12/14/2016 1616 Full Code [id number]  Toy Baker, MD Inpatient   Advance Care Planning Activity      Home/SNF/Other Home  Chief Complaint Cellulitis, perineum [J62.831]  Level of Care/Admitting Diagnosis ED Disposition    ED Disposition Condition [place]: [place] [[id number]]  Level of Care: Telemetry Medical [104]  May admit patient to [facility] or [facility] if equivalent level of care is available:: No  Covid Evaluation: Asymptomatic Screening Protocol (No Symptoms)  Diagnosis: Cellulitis, perineum [[id number]]  Admitting Physician: Flora Lipps [[id number]]  Attending Physician: Flora Lipps [[id number]]  Estimated length of stay: past midnight tomorrow  Certification:: I certify this patient will need inpatient services for at least 2 midnights       Medical History Past Medical History:  Diagnosis Date  . Anemia 2021  . Asthma    as a child  . Blind   . CHF (congestive heart failure) ([place]) 2021  . Diabetes mellitus without complication ([place])    type 2  . Hypertension   . Pneumonia 2021  . Renal disorder    dialysis T-TH-Sat    Allergies No Known Allergies  IV Location/Drains/Wounds Patient Lines/Drains/Airways Status   Active Line/Drains/Airways    Name:   Placement date:   Placement time:   Site:   Days:   Peripheral IV 11/26/19 Left Antecubital   11/26/19    1406    Antecubital   less than 1   Fistula / Graft Right Upper arm Arteriovenous fistula   10/16/19    0833    Upper arm   41    Hemodialysis Catheter Right Internal jugular Double lumen Permanent (Tunneled)   07/29/19    1232    Internal jugular   120   Incision (Closed) 10/16/19 Arm Right   10/16/19    0834     41          Labs/Imaging Results for orders placed or performed during the hospital encounter of 11/26/19 (from the past 48 hour(s))  Comprehensive metabolic panel     Status: Abnormal   Collection Time: 11/26/19  2:00 PM  Result Value Ref Range   Sodium 135 135 - 145 mmol/L   Potassium 5.5 (H) 3.5 - 5.1 mmol/L   Chloride 91 (L) 98 - 111 mmol/L   CO2 29 22 - 32 mmol/L   Glucose, Bld 124 (H) 70 - 99 mg/dL    Comment: Glucose reference range applies only to samples taken after fasting for at least 8 hours.   BUN 63 (H) 6 - 20 mg/dL   Creatinine, Ser 14.53 (H) 0.61 - 1.24 mg/dL   Calcium 9.2 8.9 - 10.3 mg/dL   Total Protein 7.8 6.5 - 8.1 g/dL   Albumin 3.5 3.5 - 5.0 g/dL   AST 15 15 - 41 U/L   ALT 13 0 - 44 U/L   Alkaline Phosphatase 54 38 - 126 U/L   Total Bilirubin 0.9 0.3 - 1.2 mg/dL   GFR calc  non Af Amer 3 (L) >60 mL/min   GFR calc Af Amer 4 (L) >60 mL/min   Anion gap 15 5 - 15    Comment: Performed at [facility], [place] [address] [place], [place] [address]  CBC with Differential     Status: Abnormal   Collection Time: 11/26/19  2:00 PM  Result Value Ref Range   WBC 12.8 (H) 4.0 - 10.5 K/uL   RBC 3.76 (L) 4.22 - 5.81 MIL/uL   Hemoglobin 12.0 (L) 13.0 - 17.0 g/dL   HCT 36.6 (L) 39.0 - 52.0 %   MCV 97.3 80.0 - 100.0 fL   MCH 31.9 26.0 - 34.0 pg   MCHC 32.8 30.0 - 36.0 g/dL   RDW 15.9 (H) 11.5 - 15.5 %   Platelets 196 150 - 400 K/uL   nRBC 0.0 0.0 - 0.2 %   Neutrophils Relative % 75 %   Neutro Abs 9.7 (H) 1.7 - 7.7 K/uL   Lymphocytes Relative 12 %   Lymphs Abs 1.5 0.7 - 4.0 K/uL   Monocytes Relative 11 %   Monocytes Absolute 1.4 (H) 0.1 - 1.0 K/uL   Eosinophils Relative 2 %   Eosinophils Absolute 0.2 0.0 - 0.5 K/uL   Basophils Relative 0 %   Basophils Absolute  0.0 0.0 - 0.1 K/uL   Immature Granulocytes 0 %   Abs Immature Granulocytes 0.05 0.00 - 0.07 K/uL    Comment: Performed at [facility], [place] [address] [place], [place] [address]  SARS Coronavirus 2 by RT PCR (hospital order, performed in [facility] hospital lab) Nasopharyngeal Nasopharyngeal Swab     Status: None   Collection Time: 11/26/19  4:47 PM   Specimen: Nasopharyngeal Swab  Result Value Ref Range   SARS Coronavirus 2 NEGATIVE NEGATIVE    Comment: (NOTE) SARS-CoV-2 target nucleic acids are NOT DETECTED. The SARS-CoV-2 RNA is generally detectable in upper and lower respiratory specimens during the acute phase of infection. The lowest concentration of SARS-CoV-2 viral copies this assay can detect is 250 copies / mL. A negative result does not preclude SARS-CoV-2 infection and should not be used as the sole basis for treatment or other patient management decisions.  A negative result may occur with improper specimen collection / handling, submission of specimen other than nasopharyngeal swab, presence of viral mutation(s) within the areas targeted by this assay, and inadequate number of viral copies (<250 copies / mL). A negative result must be combined with clinical observations, patient history, and epidemiological information. Fact Sheet for Patients:   StrictlyIdeas.no Fact Sheet for Healthcare Providers: BankingDealers.co.za This test is not yet approved or cleared  by the [place] FDA and has been authorized for detection and/or diagnosis of SARS-CoV-2 by FDA under an Emergency Use Authorization (EUA).  This EUA will remain in effect (meaning this test can be used) for the duration of the COVID-19 declaration under Section 564(b)(1) of the Act, 21 U.S.C. section 360bbb-3(b)(1), unless the authorization is terminated or revoked sooner. Performed at [facility], [place] [address] [place], [place] [address]    CT ABDOMEN PELVIS WO CONTRAST  Result Date: 11/26/2019 CLINICAL DATA:  Scrotal swelling or edema.  Peri medium swelling. EXAM: CT ABDOMEN AND PELVIS WITHOUT CONTRAST TECHNIQUE: Multidetector CT imaging of the abdomen and pelvis was performed following the standard protocol without IV contrast. COMPARISON:  Ultrasound same day FINDINGS: Lower chest: Lung bases are clear. Hepatobiliary: No focal hepatic lesion. No biliary duct  dilatation. Gallbladder is normal. Common bile duct is normal. Pancreas: Pancreas is normal. No ductal dilatation. No pancreatic inflammation. Spleen: Normal spleen Adrenals/urinary tract: Adrenal glands and kidneys are normal. The ureters and bladder normal. Stomach/Bowel: Stomach, small bowel, appendix, and cecum are normal. The colon and rectosigmoid colon are normal. Vascular/Lymphatic: Abdominal aorta is normal caliber. No periportal or retroperitoneal adenopathy. No pelvic adenopathy. Reproductive: Within the LEFT perineum there is a subcutaneous a fluid collection measuring 4.5 1.5 2.9 cm (image 112/2. There is subcutaneous stranding which extends towards the LEFT scrotal sac. This peritoneal collection does not communicate with the anus. Collection does not appear to extend into the peritoneal spacer rectum pararectal fat. There is no gas within the perineum. Entirety of the scrotum is not imaged. Other: No free fluid. Musculoskeletal: No aggressive osseous lesion. IMPRESSION: 1. Subcutaneous fluid collection in LEFT perineum most consistent cellulitis with potential early abscess formation. This fluid collection is relatively small. No evidence of communication with the anus to suggest perianal fistula. There is some adjacent subcutaneous stranding within the base the penis and scrotum. 2. No gas within perineum identified. 3. No evidence of extension into the peritoneal space or retroperitoneal space. Infection/inflammation appears localized in the  cutaneous tissue. Electronically Signed   By: Suzy Bouchard M.D.   On: 11/26/2019 16:01   US SCROTUM W/DOPPLER  Result Date: 11/26/2019 CLINICAL DATA:  Pain and swelling in the left perineum. EXAM: SCROTAL ULTRASOUND DOPPLER ULTRASOUND OF THE TESTICLES TECHNIQUE: Complete ultrasound examination of the testicles, epididymis, and other scrotal structures was performed. Color and spectral Doppler ultrasound were also utilized to evaluate blood flow to the testicles. COMPARISON:  None. FINDINGS: Right testicle Measurements: 4.0 x 2.3 x 2.9 cm. No mass or microlithiasis visualized. Left testicle Measurements: 4.4 x 2.0 x 3.0 cm. No mass or microlithiasis visualized. Right epididymis:  Normal in size and appearance. Left epididymis: There are 2 benign-appearing cysts in the head of the left epididymis, 10 mm and 6 mm. Hydrocele:  None visualized. Varicocele:  None visualized. Pulsed Doppler interrogation of both testes demonstrates normal low resistance arterial and venous waveforms bilaterally. There are slightly prominent reactive lymph nodes in the left inguinal region. There is subcutaneous edema in the soft tissues in the area of concern in the perineum without a definable abscess. This is consistent with cellulitis. IMPRESSION: Subcutaneous edema in the perineum consistent with cellulitis. No definable abscess. CT scan of the pelvis with intravenous contrast may be useful to determine the extent of the infection and the possibility of a deeper abscess that is not detectable by ultrasound. Normal appearing testicles. Electronically Signed   By: Lorriane Shire M.D.   On: 11/26/2019 14:10    Pending Labs Unresulted Labs (From admission, onward)    Start     Ordered   11/26/19 1924  Renal function panel  Once,   STAT     11/26/19 1934   11/26/19 1924  CBC  Once,   STAT     11/26/19 1934   11/26/19 1640  Culture, blood (routine x 2)  BLOOD CULTURE X 2,   STAT     11/26/19 1639   Signed and Held  CBC   (enoxaparin (LOVENOX)    CrCl >/= 30 ml/min)  Once,   R    Comments: Baseline for enoxaparin therapy IF NOT ALREADY DRAWN.  Notify MD if PLT < 100 K.    Signed and Held   Signed and Held  Creatinine, serum  (enoxaparin (LOVENOX)  CrCl >/= 30 ml/min)  Once,   R    Comments: Baseline for enoxaparin therapy IF NOT ALREADY DRAWN.    Signed and Held   Signed and Held  Creatinine, serum  (enoxaparin (LOVENOX)    CrCl >/= 30 ml/min)  Weekly,   R    Comments: while on enoxaparin therapy    Signed and Held   Signed and Held  Comprehensive metabolic panel  Tomorrow morning,   R     Signed and Held   Signed and Held  CBC  Tomorrow morning,   R     Signed and Held   Signed and Held  Hemoglobin A1c  Once,   R     Signed and Held          Vitals/Pain Today's Vitals   11/26/19 1119 11/26/19 1645 11/26/19 1800  BP: 124/69 130/76 122/63  Pulse: 78 79 91  Resp: 16 14 20   Temp: 99.1 F (37.3 C)    TempSrc: Oral    SpO2: 100% 100% 99%    Isolation Precautions No active isolations  Medications Medications  iohexol (OMNIPAQUE) 300 MG/ML solution 100 mL ( Intravenous Canceled Entry 11/26/19 1514)  sodium chloride (PF) 0.9 % injection (  Canceled Entry 11/26/19 1504)  vancomycin (VANCOREADY) IVPB 2000 mg/400 mL (2,000 mg Intravenous New Bag/Given 11/26/19 1848)  atorvastatin (LIPITOR) tablet 20 mg (has no administration in time range)  carvedilol (COREG) tablet 25 mg (25 mg Oral Given 11/26/19 1843)  gabapentin (NEURONTIN) capsule 100 mg (has no administration in time range)  oxyCODONE-acetaminophen (PERCOCET/ROXICET) 5-325 MG per tablet 1 tablet (has no administration in time range)  pantoprazole (PROTONIX) EC tablet 20 mg (has no administration in time range)  hydrALAZINE (APRESOLINE) tablet 25 mg (has no administration in time range)  Chlorhexidine Gluconate Cloth 2 % PADS 6 each (has no administration in time range)  pentafluoroprop-tetrafluoroeth (GEBAUERS) aerosol 1 application (has no  administration in time range)  lidocaine (PF) (XYLOCAINE) 1 % injection 5 mL (has no administration in time range)  lidocaine-prilocaine (EMLA) cream 1 application (has no administration in time range)  0.9 %  sodium chloride infusion (has no administration in time range)  0.9 %  sodium chloride infusion (has no administration in time range)  heparin injection 1,000 Units (has no administration in time range)  alteplase (CATHFLO ACTIVASE) injection 2 mg (has no administration in time range)  heparin injection 3,000 Units (has no administration in time range)  sodium zirconium cyclosilicate (LOKELMA) packet 10 g (10 g Oral Given 11/26/19 1843)    Mobility walks

## 2019-11-26 NOTE — ED Notes (Signed)
Called again to give report but RN is still busy in another room. Will try back in a few minutes.

## 2019-11-27 DIAGNOSIS — N492 Inflammatory disorders of scrotum: Secondary | ICD-10-CM

## 2019-11-27 LAB — COMPREHENSIVE METABOLIC PANEL
ALT: 14 U/L (ref 0–44)
AST: 13 U/L — ABNORMAL LOW (ref 15–41)
Albumin: 2.8 g/dL — ABNORMAL LOW (ref 3.5–5.0)
Alkaline Phosphatase: 52 U/L (ref 38–126)
Anion gap: 16 — ABNORMAL HIGH (ref 5–15)
BUN: 68 mg/dL — ABNORMAL HIGH (ref 6–20)
CO2: 25 mmol/L (ref 22–32)
Calcium: 8.7 mg/dL — ABNORMAL LOW (ref 8.9–10.3)
Chloride: 93 mmol/L — ABNORMAL LOW (ref 98–111)
Creatinine, Ser: 15.82 mg/dL — ABNORMAL HIGH (ref 0.61–1.24)
GFR calc Af Amer: 4 mL/min — ABNORMAL LOW (ref >60)
GFR calc non Af Amer: 3 mL/min — ABNORMAL LOW (ref >60)
Glucose, Bld: 98 mg/dL (ref 70–99)
Potassium: 4.4 mmol/L (ref 3.5–5.1)
Sodium: 134 mmol/L — ABNORMAL LOW (ref 135–145)
Total Bilirubin: 0.6 mg/dL (ref 0.3–1.2)
Total Protein: 6.8 g/dL (ref 6.5–8.1)

## 2019-11-27 LAB — CBC
HCT: 31.7 % — ABNORMAL LOW (ref 39.0–52.0)
Hemoglobin: 10.6 g/dL — ABNORMAL LOW (ref 13.0–17.0)
MCH: 32.4 pg (ref 26.0–34.0)
MCHC: 33.4 g/dL (ref 30.0–36.0)
MCV: 96.9 fL (ref 80.0–100.0)
Platelets: 182 10*3/uL (ref 150–400)
RBC: 3.27 MIL/uL — ABNORMAL LOW (ref 4.22–5.81)
RDW: 15.8 % — ABNORMAL HIGH (ref 11.5–15.5)
WBC: 12 10*3/uL — ABNORMAL HIGH (ref 4.0–10.5)
nRBC: 0 % (ref 0.0–0.2)

## 2019-11-27 LAB — HEMOGLOBIN A1C
Hgb A1c MFr Bld: 6.2 % — ABNORMAL HIGH (ref 4.8–5.6)
Mean Plasma Glucose: 131.24 mg/dL

## 2019-11-27 MED ORDER — HEPARIN SODIUM (PORCINE) 1000 UNIT/ML IJ SOLN
INTRAMUSCULAR | Status: AC
  Administered 2019-11-27: 3800 [IU]
  Filled 2019-11-27: qty 4

## 2019-11-27 MED ORDER — CALCIUM ACETATE (PHOS BINDER) 667 MG PO CAPS
1334.0000 mg | ORAL_CAPSULE | Freq: Three times a day (TID) | ORAL | Status: DC
  Administered 2019-11-27 (×2): 1334 mg via ORAL
  Filled 2019-11-27 (×3): qty 2

## 2019-11-27 MED ORDER — ALUM & MAG HYDROXIDE-SIMETH 200-200-20 MG/5ML PO SUSP
30.0000 mL | Freq: Four times a day (QID) | ORAL | Status: DC | PRN
  Administered 2019-11-27: 30 mL via ORAL
  Filled 2019-11-27: qty 30

## 2019-11-27 MED ORDER — CHLORHEXIDINE GLUCONATE CLOTH 2 % EX PADS
6.0000 | MEDICATED_PAD | Freq: Every day | CUTANEOUS | Status: DC
  Administered 2019-11-28: 6 via TOPICAL

## 2019-11-27 MED ORDER — HEPARIN SODIUM (PORCINE) 1000 UNIT/ML IJ SOLN
INTRAMUSCULAR | Status: AC
  Administered 2019-11-27: 3000 [IU] via INTRAVENOUS_CENTRAL
  Filled 2019-11-27: qty 3

## 2019-11-27 MED ORDER — CEFAZOLIN SODIUM-DEXTROSE 2-4 GM/100ML-% IV SOLN
2.0000 g | Freq: Once | INTRAVENOUS | Status: AC
  Administered 2019-11-27: 2 g via INTRAVENOUS
  Filled 2019-11-27: qty 100

## 2019-11-27 MED ORDER — RENA-VITE PO TABS
1.0000 | ORAL_TABLET | Freq: Every day | ORAL | Status: DC
  Administered 2019-11-27: 1 via ORAL
  Filled 2019-11-27: qty 1

## 2019-11-27 NOTE — Progress Notes (Signed)
PROGRESS NOTE    Corey Park  ZOX:[id number] DOB: 1971-03-14 DOA: 11/26/2019 PCP: Antony Blackbird, MD    Brief Narrative:  49 y.o. male with past medical history of CHF, diabetes mellitus, end-stage renal disease on hemodialysis Monday, wed, friday, anemia, blindness presented to hospital with complains of painful mass and swelling in the genital area for the last 2 or 3 days with recently increased  swelling and pain.  Patient denies any trauma to the part.  Patient does have history of End-stage renal disease disease on hemodialysis Monday Wednesday Friday schedule and is due for dialysis today.  He has been started on hemodialysis since February 2021 and follows up with nephrology as outpatient.  Patient still makes urine but denies any urinary urgency, frequency or dysuria.  No fever chills or rigor at home but he states that he has pain in the groin whenever he tries to move.  Denies any abdominal pain, distention, nausea, vomiting.  Denies any dizziness, lightheadedness or syncope.  Denies any chest pain, palpitation, cough or shortness of breath.    ED Course: In the ED, patient was noted to be afebrile but had leukocytosis.  CT scan of the abdomen and pelvis showed subcutaneous fluid collection in the left perineum consistent with cellulitis with potential early abscess formation.  Relatively very small.  No communication with the anal area.  No gas noted.  Due to location of cellulitis and the patient being on dialysis and diabetes, he was considered for IV antibiotics.  Assessment & Plan:   Active Problems:   Cellulitis, perineum   Cellulitis of the perineum and scrotal skin.   -CT imaging without evidence of gas or overt abscess at this time but small localized collection.   -Patient is afebrile. WBC down to 12.0 from 12.8. -Continued on ancef -This AM, pt reports clinical improvement, feeling better -Will repeat CBC in AM  End-stage renal disease on hemodialysis  -On Monday,  Wednesday Friday.   -Nephrology following. Cont HD per Nephro  Hyperkalemia.   -Corrected with HD -Cont to follow lytes  Chronic systolic congestive heart failure.   -Last known ejection fraction of 20 to 25% by echo on 08/03/2019. -Appears to be compensated at this time via HD  History of diabetes mellitus type 2.   -Blindness and end-stage renal disease associated with diabetes.  Patient states that he is not on any medication for diabetes at this time and is controlled through diet.  Previous hemoglobin A1c of 6.0 on 07/28/2019. Current a1c of 6.2. Continue sliding scale insulin if necessary.  Hypertension.  On Coreg at home. Tolerating, will continue  DVT prophylaxis: Lovenox subq Code Status: Full Family Communication: Pt in room, family not at bedside  Status is: Inpatient  Remains inpatient appropriate because:IV treatments appropriate due to intensity of illness or inability to take PO   Dispo: The patient is from: Home              Anticipated d/c is to: Home              Anticipated d/c date is: 1 day              Patient currently is not medically stable to d/c.       Consultants:   nephrology  Procedures:     Antimicrobials: Anti-infectives (From admission, onward)   Start     Dose/Rate Route Frequency Ordered Stop   11/27/19 2200  ceFAZolin (ANCEF) IVPB 1 g/50 mL premix  1 g 100 mL/hr over 30 Minutes Intravenous Every 24 hours 11/26/19 2354     11/27/19 0015  ceFAZolin (ANCEF) IVPB 2g/100 mL premix     2 g 200 mL/hr over 30 Minutes Intravenous  Once 11/27/19 0005 11/27/19 0146   11/26/19 1700  vancomycin (VANCOREADY) IVPB 2000 mg/400 mL     2,000 mg 200 mL/hr over 120 Minutes Intravenous  Once 11/26/19 1648 11/26/19 2048       Subjective: Feeling better today  Objective: Vitals:   11/27/19 1230 11/27/19 1300 11/27/19 1320 11/27/19 1504  BP: 100/62 (!) 93/49 (!) 106/57 119/66  Pulse: 76 84 84 88  Resp:   18 17  Temp:   98.6 F (37 C)  98 F (36.7 C)  TempSrc:   Oral Axillary  SpO2:   95% 98%  Weight:   94.2 kg   Height:        Intake/Output Summary (Last 24 hours) at 11/27/2019 1726 Last data filed at 11/27/2019 1700 Gross per 24 hour  Intake 1268.47 ml  Output 250 ml  Net 1018.47 ml   Filed Weights   11/27/19 0135 11/27/19 0900 11/27/19 1320  Weight: 98.1 kg 97.8 kg 94.2 kg    Examination:  General exam: Appears calm and comfortable  Respiratory system: Clear to auscultation. Respiratory effort normal. Cardiovascular system: S1 & S2 heard, Regular Gastrointestinal system: Abdomen is nondistended, soft and nontender. No organomegaly or masses felt. Normal bowel sounds heard. Central nervous system: Alert and oriented. No focal neurological deficits. Extremities: Symmetric 5 x 5 power. Skin: No rashes, normal skin turgor Psychiatry: Judgement and insight appear normal. Mood & affect appropriate.   Data Reviewed: I have personally reviewed following labs and imaging studies  CBC: Recent Labs  Lab 11/26/19 1400 11/27/19 0414  WBC 12.8* 12.0*  NEUTROABS 9.7*  --   HGB 12.0* 10.6*  HCT 36.6* 31.7*  MCV 97.3 96.9  PLT 196 542   Basic Metabolic Panel: Recent Labs  Lab 11/26/19 1400 11/27/19 0414  NA 135 134*  K 5.5* 4.4  CL 91* 93*  CO2 29 25  GLUCOSE 124* 98  BUN 63* 68*  CREATININE 14.53* 15.82*  CALCIUM 9.2 8.7*   GFR: Estimated Creatinine Clearance: 6.5 mL/min (A) (by C-G formula based on SCr of 15.82 mg/dL (H)). Liver Function Tests: Recent Labs  Lab 11/26/19 1400 11/27/19 0414  AST 15 13*  ALT 13 14  ALKPHOS 54 52  BILITOT 0.9 0.6  PROT 7.8 6.8  ALBUMIN 3.5 2.8*   No results for input(s): LIPASE, AMYLASE in the last 168 hours. No results for input(s): AMMONIA in the last 168 hours. Coagulation Profile: No results for input(s): INR, PROTIME in the last 168 hours. Cardiac Enzymes: No results for input(s): CKTOTAL, CKMB, CKMBINDEX, TROPONINI in the last 168 hours. BNP (last 3  results) No results for input(s): PROBNP in the last 8760 hours. HbA1C: Recent Labs    11/27/19 0414  HGBA1C 6.2*   CBG: No results for input(s): GLUCAP in the last 168 hours. Lipid Profile: No results for input(s): CHOL, HDL, LDLCALC, TRIG, CHOLHDL, LDLDIRECT in the last 72 hours. Thyroid Function Tests: No results for input(s): TSH, T4TOTAL, FREET4, T3FREE, THYROIDAB in the last 72 hours. Anemia Panel: No results for input(s): VITAMINB12, FOLATE, FERRITIN, TIBC, IRON, RETICCTPCT in the last 72 hours. Sepsis Labs: No results for input(s): PROCALCITON, LATICACIDVEN in the last 168 hours.  Recent Results (from the past 240 hour(s))  Culture, blood (routine x 2)  Status: None (Preliminary result)   Collection Time: 11/26/19  4:40 PM   Specimen: BLOOD LEFT HAND  Result Value Ref Range Status   Specimen Description   Final    BLOOD LEFT HAND Performed at [facility] [address] [place], [place] [address]    Special Requests   Final    BOTTLES DRAWN AEROBIC ONLY Blood Culture results may not be optimal due to an inadequate volume of blood received in culture bottles Performed at [facility] [address], [place], [place] [address]    Culture   Final    NO GROWTH < 24 HOURS Performed at [facility] [address], [place], [place] [address]    Report Status PENDING  Incomplete  Culture, blood (routine x 2)     Status: None (Preliminary result)   Collection Time: 11/26/19  4:45 PM   Specimen: BLOOD RIGHT HAND  Result Value Ref Range Status   Specimen Description   Final    BLOOD RIGHT HAND Performed at [facility] [address], [place], [place] [address]    Special Requests   Final    BOTTLES DRAWN AEROBIC ONLY Blood Culture results may not be optimal due to an inadequate volume of blood received in culture bottles Performed at [facility] [address] [place], [place]  [address]    Culture   Final    NO GROWTH < 24 HOURS Performed at [facility] [address], [place], [place] [address]    Report Status PENDING  Incomplete  SARS Coronavirus 2 by RT PCR (hospital order, performed in [facility] hospital lab) Nasopharyngeal Nasopharyngeal Swab     Status: None   Collection Time: 11/26/19  4:47 PM   Specimen: Nasopharyngeal Swab  Result Value Ref Range Status   SARS Coronavirus 2 NEGATIVE NEGATIVE Final    Comment: (NOTE) SARS-CoV-2 target nucleic acids are NOT DETECTED. The SARS-CoV-2 RNA is generally detectable in upper and lower respiratory specimens during the acute phase of infection. The lowest concentration of SARS-CoV-2 viral copies this assay can detect is 250 copies / mL. A negative result does not preclude SARS-CoV-2 infection and should not be used as the sole basis for treatment or other patient management decisions.  A negative result may occur with improper specimen collection / handling, submission of specimen other than nasopharyngeal swab, presence of viral mutation(s) within the areas targeted by this assay, and inadequate number of viral copies (<250 copies / mL). A negative result must be combined with clinical observations, patient history, and epidemiological information. Fact Sheet for Patients:   StrictlyIdeas.no Fact Sheet for Healthcare Providers: BankingDealers.co.za This test is not yet approved or cleared  by the [place] FDA and has been authorized for detection and/or diagnosis of SARS-CoV-2 by FDA under an Emergency Use Authorization (EUA).  This EUA will remain in effect (meaning this test can be used) for the duration of the COVID-19 declaration under Section 564(b)(1) of the Act, 21 U.S.C. section 360bbb-3(b)(1), unless the authorization is terminated or revoked sooner. Performed at [facility], [place] [address], [place], [place]  [address]      Radiology Studies: CT ABDOMEN PELVIS WO CONTRAST  Result Date: 11/26/2019 CLINICAL DATA:  Scrotal swelling or edema.  Peri medium swelling. EXAM: CT ABDOMEN AND PELVIS WITHOUT CONTRAST TECHNIQUE: Multidetector CT imaging of the abdomen and pelvis was performed following the standard protocol without IV contrast. COMPARISON:  Ultrasound same day FINDINGS: Lower chest: Lung bases are clear. Hepatobiliary: No focal hepatic lesion. No biliary duct dilatation. Gallbladder is normal. Common bile duct is normal. Pancreas: Pancreas is normal. No ductal dilatation. No pancreatic inflammation. Spleen: Normal spleen Adrenals/urinary tract: Adrenal glands and kidneys are normal. The ureters and bladder normal. Stomach/Bowel: Stomach, small bowel, appendix, and cecum are normal. The colon and rectosigmoid colon are normal. Vascular/Lymphatic: Abdominal aorta is normal caliber. No periportal or retroperitoneal adenopathy. No pelvic adenopathy. Reproductive: Within the LEFT perineum there is a subcutaneous a fluid collection measuring 4.5 1.5 2.9 cm (image 112/2. There is subcutaneous stranding which extends towards the LEFT scrotal sac. This peritoneal collection does not communicate with the anus. Collection does not appear to extend into the peritoneal spacer rectum pararectal fat. There is no gas within the perineum. Entirety of the scrotum is not imaged. Other: No free fluid. Musculoskeletal: No aggressive osseous lesion. IMPRESSION: 1. Subcutaneous fluid collection in LEFT perineum most consistent cellulitis with potential early abscess formation. This fluid collection is relatively small. No evidence of communication with the anus to suggest perianal fistula. There is some adjacent subcutaneous stranding within the base the penis and scrotum. 2. No gas within perineum identified. 3. No evidence of extension into the peritoneal space or retroperitoneal space. Infection/inflammation appears localized in the  cutaneous tissue. Electronically Signed   By: Suzy Bouchard M.D.   On: 11/26/2019 16:01   US SCROTUM W/DOPPLER  Result Date: 11/26/2019 CLINICAL DATA:  Pain and swelling in the left perineum. EXAM: SCROTAL ULTRASOUND DOPPLER ULTRASOUND OF THE TESTICLES TECHNIQUE: Complete ultrasound examination of the testicles, epididymis, and other scrotal structures was performed. Color and spectral Doppler ultrasound were also utilized to evaluate blood flow to the testicles. COMPARISON:  None. FINDINGS: Right testicle Measurements: 4.0 x 2.3 x 2.9 cm. No mass or microlithiasis visualized. Left testicle Measurements: 4.4 x 2.0 x 3.0 cm. No mass or microlithiasis visualized. Right epididymis:  Normal in size and appearance. Left epididymis: There are 2 benign-appearing cysts in the head of the left epididymis, 10 mm and 6 mm. Hydrocele:  None visualized. Varicocele:  None visualized. Pulsed Doppler interrogation of both testes demonstrates normal low resistance arterial and venous waveforms bilaterally. There are slightly prominent reactive lymph nodes in the left inguinal region. There is subcutaneous edema in the soft tissues in the area of concern in the perineum without a definable abscess. This is consistent with cellulitis. IMPRESSION: Subcutaneous edema in the perineum consistent with cellulitis. No definable abscess. CT scan of the pelvis with intravenous contrast may be useful to determine the extent of the infection and the possibility of a deeper abscess that is not detectable by ultrasound. Normal appearing testicles. Electronically Signed   By: Lorriane Shire M.D.   On: 11/26/2019 14:10    Scheduled Meds:  atorvastatin  20 mg Oral Daily   calcium acetate  1,334 mg Oral TID with meals   carvedilol  25 mg Oral BID WC   [START ON 11/28/2019] Chlorhexidine Gluconate Cloth  6 each Topical Q0600   docusate sodium  100 mg Oral BID   enoxaparin (LOVENOX) injection  30 mg Subcutaneous Q24H   multivitamin   1 tablet Oral QHS   Continuous Infusions:  sodium chloride     sodium chloride      ceFAZolin (ANCEF) IV       LOS: 1 day   Marylu Lund, MD [facility] Pager On Amion  If 7PM-7AM, please contact night-coverage 11/27/2019, 5:26  PM

## 2019-11-27 NOTE — Plan of Care (Signed)
  Problem: Education: Goal: Knowledge of General Education information will improve Description Including pain rating scale, medication(s)/side effects and non-pharmacologic comfort measures Outcome: Progressing   

## 2019-11-27 NOTE — Plan of Care (Signed)
  Problem: Nutrition: Goal: Adequate nutrition will be maintained Outcome: Progressing   

## 2019-11-27 NOTE — Plan of Care (Signed)
  Problem: Clinical Measurements: Goal: Ability to maintain clinical measurements within normal limits will improve Outcome: Progressing   

## 2019-11-27 NOTE — Progress Notes (Signed)
New Admission Note: ? Arrival Method: Stretcher Mental Orientation: Alert and Oriented X 4 Telemetry: Box 16 Assessment: Completed Skin: Refer to flowsheet IV: Left Antecubital Pain: 0/10 Tubes: Safety Measures: Safety Fall Prevention Plan discussed with patient. Admission: Completed [facility] [place] Orientation: Patient has been orientated to the room, unit and the staff. Family: None at the bedside Orders have been reviewed and are being implemented. Will continue to monitor the patient. Call light has been placed within reach and bed alarm has been activated.   Milagros Loll, RN   Phone Number: [phone]

## 2019-11-27 NOTE — Consult Note (Signed)
[facility] Renal Consultation Note    Indication for Consultation:  Management of ESRD/hemodialysis; anemia, hypertension/volume and secondary hyperparathyroidism PCP:  HPI: Corey Park is a 49 y.o. male with ESRD on TTS dialysis at with PMHx significant for HTN, DM, blindness, COVID PNA February 2021 who presented to the ED with genital pain and swelling of several days duration. He went to [facility] over the weekend and did not mention symptoms to rounding kidney provider 5/31 when seen at dialysis.  He missed Wed dialysis due to pain.  Patient was afebrile upon arrival with ^ WBC. CT of the abd/pelvis showed early abscess formation. Given co-morbids, he was admitted for IV antibiotics. HE denies N, V, fever, chills, dysuria, SOB.  He has no issues with dialysis other than occasional cramps DM is controlled by diet. He lives with his 37 year old son.    Past Medical History:  Diagnosis Date  . Anemia 2021  . Asthma    as a child  . Blind   . CHF (congestive heart failure) ([place]) 2021  . Diabetes mellitus without complication ([place])    type 2  . Hypertension   . Pneumonia 2021  . Renal disorder    dialysis T-TH-Sat   Past Surgical History:  Procedure Laterality Date  . BASCILIC VEIN TRANSPOSITION Right 10/16/2019   Procedure: Basilic Vein Transposition Right Arm;  Surgeon: Serafina Mitchell, MD;  Location: [place];  Service: Vascular;  Laterality: Right;  . BIOPSY  12/22/2018   Procedure: BIOPSY;  Surgeon: Laurence Spates, MD;  Location: [facility];  Service: Endoscopy;;  . ESOPHAGOGASTRODUODENOSCOPY N/A 12/22/2018   Procedure: ESOPHAGOGASTRODUODENOSCOPY (EGD);  Surgeon: Laurence Spates, MD;  Location: [facility] ENDOSCOPY;  Service: Endoscopy;  Laterality: N/A;  . IR FLUORO GUIDE CV LINE RIGHT  07/29/2019  . IR US GUIDE VASC ACCESS RIGHT  07/29/2019   Family History  Problem Relation Age of Onset  . CAD Mother   . Hypertension Mother   . Diabetes Neg Hx   . Stroke Neg Hx    . Cancer Neg Hx   . Kidney failure Neg Hx    Social History:  reports that he has never smoked. He has never used smokeless tobacco. He reports previous alcohol use. He reports previous drug use. Drug: Marijuana. No Known Allergies Prior to Admission medications   Medication Sig Start Date End Date Taking? Authorizing Provider  calcium acetate (PHOSLO) 667 MG tablet Take 1,334 mg by mouth 3 (three) times daily.  09/12/19  Yes [provider]  carvedilol (COREG) 25 MG tablet Take 25 mg by mouth 2 (two) times daily with a meal.   Yes [provider]  hydrALAZINE (APRESOLINE) 25 MG tablet Take 25 mg by mouth in the morning and at bedtime.  11/06/19  Yes [provider]  multivitamin (RENA-VIT) TABS tablet Take 1 tablet by mouth daily. 08/29/19  Yes [provider]  ferrous sulfate 325 (65 FE) MG tablet Take 1 tablet (325 mg total) by mouth daily. 08/03/19 09/02/19  Jonetta Osgood, MD   Current Facility-Administered Medications  Medication Dose Route Frequency Provider Last Rate Last Admin  . heparin sodium (porcine) 1000 UNIT/ML injection           . 0.9 %  sodium chloride infusion  100 mL Intravenous PRN Madelon Lips, MD      . 0.9 %  sodium chloride infusion  100 mL Intravenous PRN Madelon Lips, MD      . alteplase (CATHFLO ACTIVASE)  injection 2 mg  2 mg Intracatheter Once PRN Madelon Lips, MD      . atorvastatin (LIPITOR) tablet 20 mg  20 mg Oral Daily Pokhrel, Laxman, MD      . bisacodyl (DULCOLAX) suppository 10 mg  10 mg Rectal Daily PRN Pokhrel, Laxman, MD      . calcium acetate (PHOSLO) capsule 1,334 mg  1,334 mg Oral TID with meals Pokhrel, Laxman, MD   1,334 mg at 11/27/19 0759  . carvedilol (COREG) tablet 25 mg  25 mg Oral BID WC Pokhrel, Laxman, MD   25 mg at 11/26/19 1843  . ceFAZolin (ANCEF) IVPB 1 g/50 mL premix  1 g Intravenous Q24H Pokhrel, Laxman, MD      . Chlorhexidine Gluconate Cloth 2 % PADS 6 each  6 each Topical Q0600 Madelon Lips, MD   6 each at 11/27/19 [id number]  . docusate sodium (COLACE) capsule 100 mg  100 mg Oral BID Pokhrel, Laxman, MD      . enoxaparin (LOVENOX) injection 30 mg  30 mg Subcutaneous Q24H Pokhrel, Laxman, MD      . heparin injection 1,000 Units  1,000 Units Dialysis PRN Madelon Lips, MD      . heparin injection 3,000 Units  3,000 Units Dialysis Once in dialysis Madelon Lips, MD      . heparin sodium (porcine) 1000 UNIT/ML injection           . hydrALAZINE (APRESOLINE) tablet 25 mg  25 mg Oral BID Pokhrel, Laxman, MD      . iohexol (OMNIPAQUE) 300 MG/ML solution 100 mL  100 mL Intravenous Once PRN Pokhrel, Laxman, MD      . lidocaine (PF) (XYLOCAINE) 1 % injection 5 mL  5 mL Intradermal PRN Madelon Lips, MD      . lidocaine-prilocaine (EMLA) cream 1 application  1 application Topical PRN Madelon Lips, MD      . multivitamin (RENA-VIT) tablet 1 tablet  1 tablet Oral QHS Pokhrel, Laxman, MD      . ondansetron (ZOFRAN) tablet 4 mg  4 mg Oral Q6H PRN Pokhrel, Laxman, MD       Or  . ondansetron (ZOFRAN) injection 4 mg  4 mg Intravenous Q6H PRN Pokhrel, Laxman, MD      . oxyCODONE-acetaminophen (PERCOCET/ROXICET) 5-325 MG per tablet 1 tablet  1 tablet Oral Q6H PRN Pokhrel, Laxman, MD      . pentafluoroprop-tetrafluoroeth (GEBAUERS) aerosol 1 application  1 application Topical PRN Madelon Lips, MD      . polyethylene glycol (MIRALAX / GLYCOLAX) packet 17 g  17 g Oral Daily PRN Flora Lipps, MD       Labs: Basic Metabolic Panel: Recent Labs  Lab 11/26/19 1400 11/27/19 0414  NA 135 134*  K 5.5* 4.4  CL 91* 93*  CO2 29 25  GLUCOSE 124* 98  BUN 63* 68*  CREATININE 14.53* 15.82*  CALCIUM 9.2 8.7*   Liver Function Tests: Recent Labs  Lab 11/26/19 1400 11/27/19 0414  AST 15 13*  ALT 13 14  ALKPHOS 54 52  BILITOT 0.9 0.6  PROT 7.8 6.8  ALBUMIN 3.5 2.8*   No results for input(s): LIPASE, AMYLASE in the last 168 hours. No results for input(s): AMMONIA in the last  168 hours. CBC: Recent Labs  Lab 11/26/19 1400 11/27/19 0414  WBC 12.8* 12.0*  NEUTROABS 9.7*  --   HGB 12.0* 10.6*  HCT 36.6* 31.7*  MCV 97.3 96.9  PLT 196 182   Cardiac Enzymes: No  results for input(s): CKTOTAL, CKMB, CKMBINDEX, TROPONINI in the last 168 hours. CBG: No results for input(s): GLUCAP in the last 168 hours. Iron Studies: No results for input(s): IRON, TIBC, TRANSFERRIN, FERRITIN in the last 72 hours. Studies/Results: CT ABDOMEN PELVIS WO CONTRAST  Result Date: 11/26/2019 CLINICAL DATA:  Scrotal swelling or edema.  Peri medium swelling. EXAM: CT ABDOMEN AND PELVIS WITHOUT CONTRAST TECHNIQUE: Multidetector CT imaging of the abdomen and pelvis was performed following the standard protocol without IV contrast. COMPARISON:  Ultrasound same day FINDINGS: Lower chest: Lung bases are clear. Hepatobiliary: No focal hepatic lesion. No biliary duct dilatation. Gallbladder is normal. Common bile duct is normal. Pancreas: Pancreas is normal. No ductal dilatation. No pancreatic inflammation. Spleen: Normal spleen Adrenals/urinary tract: Adrenal glands and kidneys are normal. The ureters and bladder normal. Stomach/Bowel: Stomach, small bowel, appendix, and cecum are normal. The colon and rectosigmoid colon are normal. Vascular/Lymphatic: Abdominal aorta is normal caliber. No periportal or retroperitoneal adenopathy. No pelvic adenopathy. Reproductive: Within the LEFT perineum there is a subcutaneous a fluid collection measuring 4.5 1.5 2.9 cm (image 112/2. There is subcutaneous stranding which extends towards the LEFT scrotal sac. This peritoneal collection does not communicate with the anus. Collection does not appear to extend into the peritoneal spacer rectum pararectal fat. There is no gas within the perineum. Entirety of the scrotum is not imaged. Other: No free fluid. Musculoskeletal: No aggressive osseous lesion. IMPRESSION: 1. Subcutaneous fluid collection in LEFT perineum most  consistent cellulitis with potential early abscess formation. This fluid collection is relatively small. No evidence of communication with the anus to suggest perianal fistula. There is some adjacent subcutaneous stranding within the base the penis and scrotum. 2. No gas within perineum identified. 3. No evidence of extension into the peritoneal space or retroperitoneal space. Infection/inflammation appears localized in the cutaneous tissue. Electronically Signed   By: Suzy Bouchard M.D.   On: 11/26/2019 16:01   US SCROTUM W/DOPPLER  Result Date: 11/26/2019 CLINICAL DATA:  Pain and swelling in the left perineum. EXAM: SCROTAL ULTRASOUND DOPPLER ULTRASOUND OF THE TESTICLES TECHNIQUE: Complete ultrasound examination of the testicles, epididymis, and other scrotal structures was performed. Color and spectral Doppler ultrasound were also utilized to evaluate blood flow to the testicles. COMPARISON:  None. FINDINGS: Right testicle Measurements: 4.0 x 2.3 x 2.9 cm. No mass or microlithiasis visualized. Left testicle Measurements: 4.4 x 2.0 x 3.0 cm. No mass or microlithiasis visualized. Right epididymis:  Normal in size and appearance. Left epididymis: There are 2 benign-appearing cysts in the head of the left epididymis, 10 mm and 6 mm. Hydrocele:  None visualized. Varicocele:  None visualized. Pulsed Doppler interrogation of both testes demonstrates normal low resistance arterial and venous waveforms bilaterally. There are slightly prominent reactive lymph nodes in the left inguinal region. There is subcutaneous edema in the soft tissues in the area of concern in the perineum without a definable abscess. This is consistent with cellulitis. IMPRESSION: Subcutaneous edema in the perineum consistent with cellulitis. No definable abscess. CT scan of the pelvis with intravenous contrast may be useful to determine the extent of the infection and the possibility of a deeper abscess that is not detectable by ultrasound.  Normal appearing testicles. Electronically Signed   By: Lorriane Shire M.D.   On: 11/26/2019 14:10    ROS: As per HPI otherwise negative.  Physical Exam: Vitals:   11/27/19 1000 11/27/19 1030 11/27/19 1130 11/27/19 1147  BP: (!) 99/54 109/66 110/67 (!) 100/51  Pulse: 76  76 80 83  Resp:      Temp:      TempSrc:      SpO2:      Weight:      Height:         General: pleasant WDWN NAD Head: NCAT blind MMM Neck: Supple.  Lungs: CTA bilaterally without wheezes, rales, or rhonchi. Breathing is unlabored. Heart: RRR with S1 S2.  Abdomen: soft NT + BS G-U - not examined Lower extremities:without edema or ischemic changes, no open wounds  Neuro: A & O  X 3. Moves all extremities spontaneously. Psych:  Responds to questions appropriately with a normal affect. Dialysis Access: [facility] and right upper AVF + bruit (placed 10/16/19)  Dialysis Orders: MWF [facility] 4.25 hr TDC and maturing right upper AVF 2 K 2 Ca EDW 94 - post wt 96.8 5/31 skipped the Friday before to go to the beach and missed 6/2.  400/800 - heparin 3100 Mircera 50 q 2 weeks - hgb 12- 13 - need to stop, calcitriol 1 - weekly venofer 50  Assessment/Plan: 1. Perineal cellulitis - Vanc given on admission - changed to Ancef 2. ESRD -  HD today and again 6/4 to get back on schedule  Needs f/u with [facility] after d/c for AVF monitoring post placement 4/22 K 4.4 on 2 K bath 3. Hypertension/volume  - BP on the low side - on coreg25 bid - EDW lowered from 94.5 to 94 mid May - needs standing wts; D/c hydralazine for now - said he had only been taking coreg- BP may limit UF today. 4. Anemia  - hgb 10.6 holding ESA - just got 50 5/26 Mircera when hgb was 12.3 5/19 - no Fe for now 5. Metabolic bone disease -  Continue calcitriol/binders 3 Ca acetate ac  6. Nutrition - renal /carb mod diet/vit 7. DM - diet controlled - glu 131 A1c 6.2   Myriam Jacobson, PA-C [facility] Beeper [phone] 11/27/2019, 12:40 PM

## 2019-11-27 NOTE — Progress Notes (Signed)
Patient said he wants to eat breakfast before going to dialysis this morning. Hemodialysis nurse made aware and advised that patient is on 1st shift schedule and will call one more time to see if patient is done with breakfast and ready for dialysis. Patient made aware. Will continue to monitor.   Makih Stefanko,RN.

## 2019-11-28 DIAGNOSIS — E1129 Type 2 diabetes mellitus with other diabetic kidney complication: Secondary | ICD-10-CM

## 2019-11-28 DIAGNOSIS — R809 Proteinuria, unspecified: Secondary | ICD-10-CM

## 2019-11-28 LAB — CBC
HCT: 33.3 % — ABNORMAL LOW (ref 39.0–52.0)
Hemoglobin: 11 g/dL — ABNORMAL LOW (ref 13.0–17.0)
MCH: 32.4 pg (ref 26.0–34.0)
MCHC: 33 g/dL (ref 30.0–36.0)
MCV: 97.9 fL (ref 80.0–100.0)
Platelets: 173 10*3/uL (ref 150–400)
RBC: 3.4 MIL/uL — ABNORMAL LOW (ref 4.22–5.81)
RDW: 15.7 % — ABNORMAL HIGH (ref 11.5–15.5)
WBC: 9.7 10*3/uL (ref 4.0–10.5)
nRBC: 0 % (ref 0.0–0.2)

## 2019-11-28 LAB — RENAL FUNCTION PANEL
Albumin: 2.8 g/dL — ABNORMAL LOW (ref 3.5–5.0)
Anion gap: 13 (ref 5–15)
BUN: 33 mg/dL — ABNORMAL HIGH (ref 6–20)
CO2: 27 mmol/L (ref 22–32)
Calcium: 8.5 mg/dL — ABNORMAL LOW (ref 8.9–10.3)
Chloride: 95 mmol/L — ABNORMAL LOW (ref 98–111)
Creatinine, Ser: 10.59 mg/dL — ABNORMAL HIGH (ref 0.61–1.24)
GFR calc Af Amer: 6 mL/min — ABNORMAL LOW (ref >60)
GFR calc non Af Amer: 5 mL/min — ABNORMAL LOW (ref >60)
Glucose, Bld: 100 mg/dL — ABNORMAL HIGH (ref 70–99)
Phosphorus: 5.4 mg/dL — ABNORMAL HIGH (ref 2.5–4.6)
Potassium: 4.7 mmol/L (ref 3.5–5.1)
Sodium: 135 mmol/L (ref 135–145)

## 2019-11-28 MED ORDER — HEPARIN SODIUM (PORCINE) 1000 UNIT/ML IJ SOLN
INTRAMUSCULAR | Status: AC
  Filled 2019-11-28: qty 4

## 2019-11-28 MED ORDER — HEPARIN SODIUM (PORCINE) 1000 UNIT/ML IJ SOLN
1000.0000 [IU] | INTRAMUSCULAR | Status: DC | PRN
  Administered 2019-11-28: 3800 [IU] via INTRAVENOUS

## 2019-11-28 MED ORDER — AMOXICILLIN-POT CLAVULANATE 500-125 MG PO TABS: 1.0000 | ORAL_TABLET | Freq: Every day | ORAL | 0 refills | Status: AC

## 2019-11-28 MED ORDER — DOXYCYCLINE HYCLATE 100 MG PO TBEC: 100.0000 mg | DELAYED_RELEASE_TABLET | Freq: Two times a day (BID) | ORAL | 0 refills | Status: AC

## 2019-11-28 MED FILL — DOXYCYCLINE HYCLATE 100 MG: 100 | 7 days supply | Qty: 14 | Fill #0

## 2019-11-28 MED FILL — AMOX-CLAV 500-125 MG TABLET: 500-125 | 7 days supply | Qty: 7 | Fill #0

## 2019-11-28 NOTE — Progress Notes (Signed)
Subjective:  On hd , feels better   Objective Vital signs in last 24 hours: Vitals:   11/28/19 0930 11/28/19 1000 11/28/19 1030 11/28/19 1100  BP: 113/68 116/66 117/71 117/62  Pulse: 76 77 85 84  Resp: 20 18 19 12   Temp:      TempSrc:      SpO2:      Weight:      Height:       Weight change: -0.268 kg  Physical Exam: General: alert  On hd , NAD Lungs: CTA bilaterally. Breathing  unlabored. Heart: RRR with S1 S2.  Abdomen: soft NT , ND G-U - not examined Lower extremities:No pedal  edema  Dialysis Access: [facility]  Patent on hd and right upper AVF + bruit (placed 10/16/19)   OP Dialysis Orders: MWF [facility] 4.25 hr TDC and maturing right upper AVF 2 K 2 Ca EDW 94 - post wt 96.8 5/31 skipped the Friday before to go to the beach and missed 6/2.  400/800 - heparin 3100 Mircera 50 q 2 weeks - hgb 12- 13 - need to stop, calcitriol 1 - weekly venofer 50  Problem/Plan: 1.  Perineal cellulitis - Vanc given on admission - changed to Ancef 2. ESRD -  HD today and again 6/4 to get back on schedule  Needs f/u with [facility] after d/c for AVF monitoring post placement 4/22 K 4.4 on 2 K bath 3. Hypertension/volume  - BP 111/60 on hd and on admit  on the low side - on coreg 25 bid - have D/c hydralazine for now - said he had only been taking coreg- BP may limit UF today. EDW lowered from 94.5 to 94 mid May - needs standing wts;  4. Anemia  - hgb 10.6 >11.0 holding ESA - just got 50 5/26 Mircera  no Fe for now with Infection  5. Metabolic bone disease -  Continue calcitriol/binders 3 Ca acetate ac  6. Nutrition - Alb 2.8  Needs protein supplement  renal /carb mod diet/vit 7. DM - diet controlled -  TX per admit  Ernest Haber, PA-C [facility] Beeper [phone] 11/28/2019,11:06 AM  LOS: 2 days   Labs: Basic Metabolic Panel: Recent Labs  Lab 11/26/19 1400 11/27/19 0414 11/28/19 0732  NA 135 134* 135  K 5.5* 4.4 4.7  CL 91* 93* 95*  CO2 29 25 27   GLUCOSE 124* 98 100*  BUN 63* 68* 33*   CREATININE 14.53* 15.82* 10.59*  CALCIUM 9.2 8.7* 8.5*  PHOS  --   --  5.4*   Liver Function Tests: Recent Labs  Lab 11/26/19 1400 11/27/19 0414 11/28/19 0732  AST 15 13*  --   ALT 13 14  --   ALKPHOS 54 52  --   BILITOT 0.9 0.6  --   PROT 7.8 6.8  --   ALBUMIN 3.5 2.8* 2.8*   No results for input(s): LIPASE, AMYLASE in the last 168 hours. No results for input(s): AMMONIA in the last 168 hours. CBC: Recent Labs  Lab 11/26/19 1400 11/27/19 0414 11/28/19 0633  WBC 12.8* 12.0* 9.7  NEUTROABS 9.7*  --   --   HGB 12.0* 10.6* 11.0*  HCT 36.6* 31.7* 33.3*  MCV 97.3 96.9 97.9  PLT 196 182 173   Cardiac Enzymes: No results for input(s): CKTOTAL, CKMB, CKMBINDEX, TROPONINI in the last 168 hours. CBG: No results for input(s): GLUCAP in the last 168 hours.  Studies/Results: CT ABDOMEN PELVIS WO CONTRAST  Result Date: 11/26/2019 CLINICAL  DATA:  Scrotal swelling or edema.  Peri medium swelling. EXAM: CT ABDOMEN AND PELVIS WITHOUT CONTRAST TECHNIQUE: Multidetector CT imaging of the abdomen and pelvis was performed following the standard protocol without IV contrast. COMPARISON:  Ultrasound same day FINDINGS: Lower chest: Lung bases are clear. Hepatobiliary: No focal hepatic lesion. No biliary duct dilatation. Gallbladder is normal. Common bile duct is normal. Pancreas: Pancreas is normal. No ductal dilatation. No pancreatic inflammation. Spleen: Normal spleen Adrenals/urinary tract: Adrenal glands and kidneys are normal. The ureters and bladder normal. Stomach/Bowel: Stomach, small bowel, appendix, and cecum are normal. The colon and rectosigmoid colon are normal. Vascular/Lymphatic: Abdominal aorta is normal caliber. No periportal or retroperitoneal adenopathy. No pelvic adenopathy. Reproductive: Within the LEFT perineum there is a subcutaneous a fluid collection measuring 4.5 1.5 2.9 cm (image 112/2. There is subcutaneous stranding which extends towards the LEFT scrotal sac. This  peritoneal collection does not communicate with the anus. Collection does not appear to extend into the peritoneal spacer rectum pararectal fat. There is no gas within the perineum. Entirety of the scrotum is not imaged. Other: No free fluid. Musculoskeletal: No aggressive osseous lesion. IMPRESSION: 1. Subcutaneous fluid collection in LEFT perineum most consistent cellulitis with potential early abscess formation. This fluid collection is relatively small. No evidence of communication with the anus to suggest perianal fistula. There is some adjacent subcutaneous stranding within the base the penis and scrotum. 2. No gas within perineum identified. 3. No evidence of extension into the peritoneal space or retroperitoneal space. Infection/inflammation appears localized in the cutaneous tissue. Electronically Signed   By: Suzy Bouchard M.D.   On: 11/26/2019 16:01   US SCROTUM W/DOPPLER  Result Date: 11/26/2019 CLINICAL DATA:  Pain and swelling in the left perineum. EXAM: SCROTAL ULTRASOUND DOPPLER ULTRASOUND OF THE TESTICLES TECHNIQUE: Complete ultrasound examination of the testicles, epididymis, and other scrotal structures was performed. Color and spectral Doppler ultrasound were also utilized to evaluate blood flow to the testicles. COMPARISON:  None. FINDINGS: Right testicle Measurements: 4.0 x 2.3 x 2.9 cm. No mass or microlithiasis visualized. Left testicle Measurements: 4.4 x 2.0 x 3.0 cm. No mass or microlithiasis visualized. Right epididymis:  Normal in size and appearance. Left epididymis: There are 2 benign-appearing cysts in the head of the left epididymis, 10 mm and 6 mm. Hydrocele:  None visualized. Varicocele:  None visualized. Pulsed Doppler interrogation of both testes demonstrates normal low resistance arterial and venous waveforms bilaterally. There are slightly prominent reactive lymph nodes in the left inguinal region. There is subcutaneous edema in the soft tissues in the area of concern in  the perineum without a definable abscess. This is consistent with cellulitis. IMPRESSION: Subcutaneous edema in the perineum consistent with cellulitis. No definable abscess. CT scan of the pelvis with intravenous contrast may be useful to determine the extent of the infection and the possibility of a deeper abscess that is not detectable by ultrasound. Normal appearing testicles. Electronically Signed   By: Lorriane Shire M.D.   On: 11/26/2019 14:10   Medications: . sodium chloride    . sodium chloride    .  ceFAZolin (ANCEF) IV 1 g (11/27/19 2159)   . atorvastatin  20 mg Oral Daily  . calcium acetate  1,334 mg Oral TID with meals  . carvedilol  25 mg Oral BID WC  . Chlorhexidine Gluconate Cloth  6 each Topical Q0600  . docusate sodium  100 mg Oral BID  . enoxaparin (LOVENOX) injection  30 mg Subcutaneous Q24H  .  multivitamin  1 tablet Oral QHS

## 2019-11-28 NOTE — Discharge Summary (Signed)
Physician Discharge Summary  Corey Park HYW:[id number] DOB: 14-Jan-1971 DOA: 11/26/2019  PCP: Antony Blackbird, MD  Admit date: 11/26/2019 Discharge date: 11/28/2019  Admitted From: Home Disposition:  Home  Recommendations for Outpatient Follow-up:  1. Follow up with PCP in 1-2 weeks  Discharge Condition:Improved CODE STATUS:Full Diet recommendation: Renal   Brief/Interim Summary: 49 y.o.malewith past medical history of CHF, diabetes mellitus, end-stage renal disease on hemodialysis Monday, wed, friday, anemia, blindness presented to hospital with complains ofpainful mass and swellingin the genital area for the last 2 or 3 dayswith recently increasedswelling and pain.Patient denies any trauma to the part. Patient does have history of End-stage renal diseasedisease on hemodialysis Monday Wednesday Friday schedule and is due for dialysis today.He has been started on hemodialysis since February 2021 and follows up with nephrology as outpatient. Patient still makes urine but denies any urinary urgency, frequency or dysuria. No fever chills or rigor at home but he states that he has pain in the groin whenever he tries to move. Denies any abdominal pain, distention, nausea, vomiting. Denies any dizziness, lightheadedness or syncope. Denies any chest pain, palpitation, cough or shortness of breath.   ED Course:In the ED, patient was noted to be afebrile but had leukocytosis.CT scan of the abdomen and pelvis showed subcutaneous fluid collection in the left perineum consistent with cellulitis with potential early abscess formation. Relatively very small. No communication with the analarea. No gas noted. Due tolocation ofcellulitis and the patient being on dialysis and diabetes,he was considered for IV antibiotics.   Discharge Diagnoses:  Active Problems:   Cellulitis, perineum  Cellulitis of the perineumand scrotal skin. -CT imaging without evidence of gas or overt  abscess at this time but small localized collection.  -Patient remained afebrile. WBC normalized. -Clinically improved with ancef. Pt reports scrotal pain essentially resolved -Will transition to augmentin with doxycycline to complete course of tx  End-stage renal disease on hemodialysis -On Monday,Wednesday Friday.  -Nephrology following. Cont HD per Nephro  Hyperkalemia.  -Corrected with HD  Chronic systolic congestiveheart failure.  -Last known ejection fraction of 20 to 25% by echo on 08/03/2019. -Appears to be compensated at this time via HD  History of diabetes mellitus type 2. -Blindness and end-stage renal disease associated with diabetes. Patient states that he is not on any medication for diabetes at this time and is controlled through diet. Previous hemoglobin A1c of 6.0 on 07/28/2019. Current a1c of 6.2. Continued sliding scale insulin while in hospital  Hypertension.On Coreg at home. Tolerating, will continue  Discharge Instructions   Allergies as of 11/28/2019   No Known Allergies     Medication List    STOP taking these medications   ferrous sulfate 325 (65 FE) MG tablet     TAKE these medications   amoxicillin-clavulanate 500-125 MG tablet Commonly known as: Augmentin Take 1 tablet (500 mg total) by mouth daily for 7 days.   calcium acetate 667 MG tablet Commonly known as: PHOSLO Take 1,334 mg by mouth 3 (three) times daily.   carvedilol 25 MG tablet Commonly known as: COREG Take 25 mg by mouth 2 (two) times daily with a meal.   doxycycline 100 MG EC tablet Commonly known as: DORYX Take 1 tablet (100 mg total) by mouth 2 (two) times daily for 7 days.   hydrALAZINE 25 MG tablet Commonly known as: APRESOLINE Take 25 mg by mouth in the morning and at bedtime.   multivitamin Tabs tablet Take 1 tablet by mouth daily.  No Known Allergies  Consultations:  Nephrology  Procedures/Studies: CT ABDOMEN PELVIS WO  CONTRAST  Result Date: 11/26/2019 CLINICAL DATA:  Scrotal swelling or edema.  Peri medium swelling. EXAM: CT ABDOMEN AND PELVIS WITHOUT CONTRAST TECHNIQUE: Multidetector CT imaging of the abdomen and pelvis was performed following the standard protocol without IV contrast. COMPARISON:  Ultrasound same day FINDINGS: Lower chest: Lung bases are clear. Hepatobiliary: No focal hepatic lesion. No biliary duct dilatation. Gallbladder is normal. Common bile duct is normal. Pancreas: Pancreas is normal. No ductal dilatation. No pancreatic inflammation. Spleen: Normal spleen Adrenals/urinary tract: Adrenal glands and kidneys are normal. The ureters and bladder normal. Stomach/Bowel: Stomach, small bowel, appendix, and cecum are normal. The colon and rectosigmoid colon are normal. Vascular/Lymphatic: Abdominal aorta is normal caliber. No periportal or retroperitoneal adenopathy. No pelvic adenopathy. Reproductive: Within the LEFT perineum there is a subcutaneous a fluid collection measuring 4.5 1.5 2.9 cm (image 112/2. There is subcutaneous stranding which extends towards the LEFT scrotal sac. This peritoneal collection does not communicate with the anus. Collection does not appear to extend into the peritoneal spacer rectum pararectal fat. There is no gas within the perineum. Entirety of the scrotum is not imaged. Other: No free fluid. Musculoskeletal: No aggressive osseous lesion. IMPRESSION: 1. Subcutaneous fluid collection in LEFT perineum most consistent cellulitis with potential early abscess formation. This fluid collection is relatively small. No evidence of communication with the anus to suggest perianal fistula. There is some adjacent subcutaneous stranding within the base the penis and scrotum. 2. No gas within perineum identified. 3. No evidence of extension into the peritoneal space or retroperitoneal space. Infection/inflammation appears localized in the cutaneous tissue. Electronically Signed   By: Suzy Bouchard M.D.   On: 11/26/2019 16:01   US SCROTUM W/DOPPLER  Result Date: 11/26/2019 CLINICAL DATA:  Pain and swelling in the left perineum. EXAM: SCROTAL ULTRASOUND DOPPLER ULTRASOUND OF THE TESTICLES TECHNIQUE: Complete ultrasound examination of the testicles, epididymis, and other scrotal structures was performed. Color and spectral Doppler ultrasound were also utilized to evaluate blood flow to the testicles. COMPARISON:  None. FINDINGS: Right testicle Measurements: 4.0 x 2.3 x 2.9 cm. No mass or microlithiasis visualized. Left testicle Measurements: 4.4 x 2.0 x 3.0 cm. No mass or microlithiasis visualized. Right epididymis:  Normal in size and appearance. Left epididymis: There are 2 benign-appearing cysts in the head of the left epididymis, 10 mm and 6 mm. Hydrocele:  None visualized. Varicocele:  None visualized. Pulsed Doppler interrogation of both testes demonstrates normal low resistance arterial and venous waveforms bilaterally. There are slightly prominent reactive lymph nodes in the left inguinal region. There is subcutaneous edema in the soft tissues in the area of concern in the perineum without a definable abscess. This is consistent with cellulitis. IMPRESSION: Subcutaneous edema in the perineum consistent with cellulitis. No definable abscess. CT scan of the pelvis with intravenous contrast may be useful to determine the extent of the infection and the possibility of a deeper abscess that is not detectable by ultrasound. Normal appearing testicles. Electronically Signed   By: Lorriane Shire M.D.   On: 11/26/2019 14:10     Subjective: Reports feeling much better. Very eager to go home  Discharge Exam: Vitals:   11/28/19 1200 11/28/19 1229  BP: 119/75 119/75  Pulse: 78 78  Resp: 14   Temp: 98.9 F (37.2 C) 98.9 F (37.2 C)  SpO2: 100% 100%   Vitals:   11/28/19 1100 11/28/19 1132 11/28/19 1200 11/28/19 1229  BP: 117/62 119/67 119/75 119/75  Pulse: 84 78 78 78  Resp: 12 16 14     Temp:  98.5 F (36.9 C) 98.9 F (37.2 C) 98.9 F (37.2 C)  TempSrc:  Oral Oral Oral  SpO2:  99% 100% 100%  Weight:  93.5 kg    Height:        General: Pt is alert, awake, not in acute distress Cardiovascular: RRR, S1/S2 +, no rubs, no gallops Respiratory: CTA bilaterally, no wheezing, no rhonchi Abdominal: Soft, NT, ND, bowel sounds + Extremities: no edema, no cyanosis   The results of significant diagnostics from this hospitalization (including imaging, microbiology, ancillary and laboratory) are listed below for reference.     Microbiology: Recent Results (from the past 240 hour(s))  Culture, blood (routine x 2)     Status: None (Preliminary result)   Collection Time: 11/26/19  4:40 PM   Specimen: BLOOD LEFT HAND  Result Value Ref Range Status   Specimen Description   Final    BLOOD LEFT HAND Performed at [facility], [place] [address] [place], [place] [address]    Special Requests   Final    BOTTLES DRAWN AEROBIC ONLY Blood Culture results may not be optimal due to an inadequate volume of blood received in culture bottles Performed at [facility] [address] [place], [place] [address]    Culture   Final    NO GROWTH 2 DAYS Performed at [facility] [address] [place], [place] [address]    Report Status PENDING  Incomplete  Culture, blood (routine x 2)     Status: None (Preliminary result)   Collection Time: 11/26/19  4:45 PM   Specimen: BLOOD RIGHT HAND  Result Value Ref Range Status   Specimen Description   Final    BLOOD RIGHT HAND Performed at [facility] [address] [place], [place] [address]    Special Requests   Final    BOTTLES DRAWN AEROBIC ONLY Blood Culture results may not be optimal due to an inadequate volume of blood received in culture bottles Performed at [place] [address] [place], [place] [address]    Culture   Final    NO GROWTH 2  DAYS Performed at [facility] [address] [place], [place] [address]    Report Status PENDING  Incomplete  SARS Coronavirus 2 by RT PCR (hospital order, performed in [facility] hospital lab) Nasopharyngeal Nasopharyngeal Swab     Status: None   Collection Time: 11/26/19  4:47 PM   Specimen: Nasopharyngeal Swab  Result Value Ref Range Status   SARS Coronavirus 2 NEGATIVE NEGATIVE Final    Comment: (NOTE) SARS-CoV-2 target nucleic acids are NOT DETECTED. The SARS-CoV-2 RNA is generally detectable in upper and lower respiratory specimens during the acute phase of infection. The lowest concentration of SARS-CoV-2 viral copies this assay can detect is 250 copies / mL. A negative result does not preclude SARS-CoV-2 infection and should not be used as the sole basis for treatment or other patient management decisions.  A negative result may occur with improper specimen collection / handling, submission of specimen other than nasopharyngeal swab, presence of viral mutation(s) within the areas targeted by this assay, and inadequate number of viral copies (<250 copies / mL). A negative result must be combined with clinical observations, patient history, and epidemiological information. Fact Sheet for Patients:   StrictlyIdeas.no Fact Sheet for Healthcare Providers:  BankingDealers.co.za This test is not yet approved or cleared  by the [place] and has been authorized for detection and/or diagnosis of SARS-CoV-2 by FDA under an Emergency Use Authorization (EUA).  This EUA will remain in effect (meaning this test can be used) for the duration of the COVID-19 declaration under Section 564(b)(1) of the Act, 21 U.S.C. section 360bbb-3(b)(1), unless the authorization is terminated or revoked sooner. Performed at [facility], [place] [address], [place], [place] [address]      Labs: BNP (last 3 results) Recent  Labs    12/21/18 0141 07/27/19 1851  BNP 553.1* 8,527.7*   Basic Metabolic Panel: Recent Labs  Lab 11/26/19 1400 11/27/19 0414 11/28/19 0732  NA 135 134* 135  K 5.5* 4.4 4.7  CL 91* 93* 95*  CO2 29 25 27   GLUCOSE 124* 98 100*  BUN 63* 68* 33*  CREATININE 14.53* 15.82* 10.59*  CALCIUM 9.2 8.7* 8.5*  PHOS  --   --  5.4*   Liver Function Tests: Recent Labs  Lab 11/26/19 1400 11/27/19 0414 11/28/19 0732  AST 15 13*  --   ALT 13 14  --   ALKPHOS 54 52  --   BILITOT 0.9 0.6  --   PROT 7.8 6.8  --   ALBUMIN 3.5 2.8* 2.8*   No results for input(s): LIPASE, AMYLASE in the last 168 hours. No results for input(s): AMMONIA in the last 168 hours. CBC: Recent Labs  Lab 11/26/19 1400 11/27/19 0414 11/28/19 0633  WBC 12.8* 12.0* 9.7  NEUTROABS 9.7*  --   --   HGB 12.0* 10.6* 11.0*  HCT 36.6* 31.7* 33.3*  MCV 97.3 96.9 97.9  PLT 196 182 173   Cardiac Enzymes: No results for input(s): CKTOTAL, CKMB, CKMBINDEX, TROPONINI in the last 168 hours. BNP: Invalid input(s): POCBNP CBG: No results for input(s): GLUCAP in the last 168 hours. D-Dimer No results for input(s): DDIMER in the last 72 hours. Hgb A1c Recent Labs    11/27/19 0414  HGBA1C 6.2*   Lipid Profile No results for input(s): CHOL, HDL, LDLCALC, TRIG, CHOLHDL, LDLDIRECT in the last 72 hours. Thyroid function studies No results for input(s): TSH, T4TOTAL, T3FREE, THYROIDAB in the last 72 hours.  Invalid input(s): FREET3 Anemia work up No results for input(s): VITAMINB12, FOLATE, FERRITIN, TIBC, IRON, RETICCTPCT in the last 72 hours. Urinalysis    Component Value Date/Time   COLORURINE YELLOW 07/27/2019 1936   APPEARANCEUR HAZY (A) 07/27/2019 1936   LABSPEC 1.011 07/27/2019 1936   PHURINE 5.0 07/27/2019 1936   GLUCOSEU 50 (A) 07/27/2019 1936   HGBUR SMALL (A) 07/27/2019 1936   BILIRUBINUR NEGATIVE 07/27/2019 1936   KETONESUR NEGATIVE 07/27/2019 1936   PROTEINUR >=300 (A) 07/27/2019 1936   NITRITE  NEGATIVE 07/27/2019 1936   LEUKOCYTESUR NEGATIVE 07/27/2019 1936   Sepsis Labs Invalid input(s): PROCALCITONIN,  WBC,  LACTICIDVEN Microbiology Recent Results (from the past 240 hour(s))  Culture, blood (routine x 2)     Status: None (Preliminary result)   Collection Time: 11/26/19  4:40 PM   Specimen: BLOOD LEFT HAND  Result Value Ref Range Status   Specimen Description   Final    BLOOD LEFT HAND Performed at [facility], [place] [address], [place], [place] [address]    Special Requests   Final    BOTTLES DRAWN AEROBIC ONLY Blood Culture results may not be optimal due to an inadequate volume of blood received in culture bottles Performed at [facility]  [place] [address], [place], [place] [address]    Culture   Final    NO GROWTH 2 DAYS Performed at [facility] [address] [place], [place] [address]    Report Status PENDING  Incomplete  Culture, blood (routine x 2)     Status: None (Preliminary result)   Collection Time: 11/26/19  4:45 PM   Specimen: BLOOD RIGHT HAND  Result Value Ref Range Status   Specimen Description   Final    BLOOD RIGHT HAND Performed at [facility] [address], [place], [place] [address]    Special Requests   Final    BOTTLES DRAWN AEROBIC ONLY Blood Culture results may not be optimal due to an inadequate volume of blood received in culture bottles Performed at [facility] [address], [place], [place] [address]    Culture   Final    NO GROWTH 2 DAYS Performed at [facility] [address] [place], [place] [address]    Report Status PENDING  Incomplete  SARS Coronavirus 2 by RT PCR (hospital order, performed in [facility] hospital lab) Nasopharyngeal Nasopharyngeal Swab     Status: None   Collection Time: 11/26/19  4:47 PM   Specimen: Nasopharyngeal Swab  Result Value Ref Range Status   SARS Coronavirus 2 NEGATIVE NEGATIVE Final    Comment:  (NOTE) SARS-CoV-2 target nucleic acids are NOT DETECTED. The SARS-CoV-2 RNA is generally detectable in upper and lower respiratory specimens during the acute phase of infection. The lowest concentration of SARS-CoV-2 viral copies this assay can detect is 250 copies / mL. A negative result does not preclude SARS-CoV-2 infection and should not be used as the sole basis for treatment or other patient management decisions.  A negative result may occur with improper specimen collection / handling, submission of specimen other than nasopharyngeal swab, presence of viral mutation(s) within the areas targeted by this assay, and inadequate number of viral copies (<250 copies / mL). A negative result must be combined with clinical observations, patient history, and epidemiological information. Fact Sheet for Patients:   StrictlyIdeas.no Fact Sheet for Healthcare Providers: BankingDealers.co.za This test is not yet approved or cleared  by the [place] FDA and has been authorized for detection and/or diagnosis of SARS-CoV-2 by FDA under an Emergency Use Authorization (EUA).  This EUA will remain in effect (meaning this test can be used) for the duration of the COVID-19 declaration under Section 564(b)(1) of the Act, 21 U.S.C. section 360bbb-3(b)(1), unless the authorization is terminated or revoked sooner. Performed at [facility], [place] [address], [place], [place] [address]    Time spent: 44min  SIGNED:   Marylu Lund, MD  [facility] 11/28/2019, 2:24 PM  If 7PM-7AM, please contact night-coverage

## 2019-11-28 NOTE — Progress Notes (Signed)
DISCHARGE NOTE HOME Corey Park to be discharged Home per MD order. Discussed prescriptions and follow up appointments with the patient. Prescriptions given to patient; medication list explained in detail. Patient verbalized understanding.  Skin clean, dry and intact without evidence of skin break down, no evidence of skin tears noted. IV catheter discontinued intact. Site without signs and symptoms of complications. Dressing and pressure applied. Pt denies pain at the site currently. No complaints noted.  Patient free of lines, drains, and wounds.   An After Visit Summary (AVS) was printed and given to the patient. Patient escorted via wheelchair, and discharged home via private auto.  Aneta Mins BSN, RN3

## 2019-11-30 ENCOUNTER — Telehealth: Payer: Self-pay | Admitting: Nephrology

## 2019-11-30 NOTE — Telephone Encounter (Signed)
Transition of care contact from inpatient facility  Date of Discharge: 11/28/19 Date of Contact: 11/30/19 Method of contact: phone Talked with: patient   Patient contact to discuss transition of care from recent inpatient hospitalization. Pateint was admitted to [facility] from  6/2-11/28/19 with the diagnosis of cellulitis perineum/scrotal skin and hyperkalemia  Medication changes were reviewed.  Has Augmentin to finish course. Knows to stop hydralazine - son is at home with him and make change as pt is blind. Also stop po Fe  Patient will follow up at outpatient dialysis 12/01/19  Other follow up needs: none  Amalia Hailey, PA-C [facility] Pager:  [phone]

## 2019-12-01 ENCOUNTER — Telehealth: Payer: Self-pay

## 2019-12-01 LAB — CULTURE, BLOOD (ROUTINE X 2)
Culture: NO GROWTH
Culture: NO GROWTH

## 2019-12-01 NOTE — Telephone Encounter (Signed)
Transition Care Management Follow-up Telephone Call Date of discharge and from where: 11/28/2019, [facility]  Call placed to patient as Dr Chapman Fitch is listed as PCP.  However, it is noted in prior message that the patient was going to be establishing care at [facility] [place]. He was not sure about establishing care.  Informed him of the addresses at both [facility] and [facility].  He stated that [facility] would be more convenient and requested that the contact information be text to him.  The address and phone number for [facility] was then text to him as requested. He said that his son would be able to get the number for him when he gets home.

## 2020-01-12 ENCOUNTER — Other Ambulatory Visit: Payer: Self-pay

## 2020-01-12 ENCOUNTER — Ambulatory Visit (INDEPENDENT_AMBULATORY_CARE_PROVIDER_SITE_OTHER): Payer: Self-pay | Admitting: Physician Assistant

## 2020-01-12 ENCOUNTER — Ambulatory Visit (HOSPITAL_COMMUNITY)
Admission: RE | Admit: 2020-01-12 | Discharge: 2020-01-12 | Disposition: A | Discharge status: Home / Self Care | Payer: Medicare Other | Source: Ambulatory Visit | Attending: Surgery | Admitting: Surgery

## 2020-01-12 VITALS — BP 143/87 | HR 85 | Temp 97.8°F | Resp 20 | Ht 73.0 in | Wt 222.1 lb

## 2020-01-12 DIAGNOSIS — Z992 Dependence on renal dialysis: Secondary | ICD-10-CM | POA: Diagnosis present

## 2020-01-12 DIAGNOSIS — N186 End stage renal disease: Secondary | ICD-10-CM | POA: Insufficient documentation

## 2020-01-12 NOTE — Progress Notes (Signed)
Pt scheduled for Rt arm 2nd stage BVT on 01/29/20. Pt has received Covid vaccine series on 09/15/19 and 10/13/19. Denied any Covid symptoms, close contact or international travel within 14 days. Minette Brine, RN

## 2020-01-12 NOTE — H&P (View-Only) (Signed)
    Postoperative Access Visit   History of Present Illness   Corey Park is a 49 y.o. year old male who presents for postoperative follow-up for: right basilic vein fistula creation 10/16/19 by Dr. Trula Slade. The patient's wounds are well healed. The patient notes no steal symptoms.  The patient is able to complete their activities of daily living. He currently is dialyzing via right IJ TDC on MWF  Physical Examination   Vitals:   01/12/20 1117  BP: (!) 143/87  Pulse: 85  Resp: 20  Temp: 97.8 F (36.6 C)  TempSrc: Temporal  SpO2: 99%  Weight: 222 lb 1.6 oz (100.7 kg)  Height: 6\' 1"  (1.854 m)   Body mass index is 29.3 kg/m.  right arm Incision is well healed, 2+ radial pulse, hand grip is 5/5, sensation in digits is intact, palpable thrill, bruit can be auscultated     Medical Decision Making    Corey Park is a 49 y.o. year old male who presents s/p right basilic vein fistula by Dr. Trula Slade on 10/16/19. His right upper extremity incision is well healed.Patent is without signs or symptoms of steal syndrome  He dialyzes MWF. He will be scheduled on a non dialysis day  Will schedule him for a second stage right basilic vein transposition by Dr. Marcell Anger, PA-C [facility] Office: [phone]  On call MD: Carlis Abbott

## 2020-01-12 NOTE — Progress Notes (Signed)
    Postoperative Access Visit   History of Present Illness   Corey Park is a 49 y.o. year old male who presents for postoperative follow-up for: right basilic vein fistula creation 10/16/19 by Dr. Trula Slade. The patient's wounds are well healed. The patient notes no steal symptoms.  The patient is able to complete their activities of daily living. He currently is dialyzing via right IJ TDC on MWF  Physical Examination   Vitals:   01/12/20 1117  BP: (!) 143/87  Pulse: 85  Resp: 20  Temp: 97.8 F (36.6 C)  TempSrc: Temporal  SpO2: 99%  Weight: 222 lb 1.6 oz (100.7 kg)  Height: 6\' 1"  (1.854 m)   Body mass index is 29.3 kg/m.  right arm Incision is well healed, 2+ radial pulse, hand grip is 5/5, sensation in digits is intact, palpable thrill, bruit can be auscultated     Medical Decision Making    Corey Park is a 49 y.o. year old male who presents s/p right basilic vein fistula by Dr. Trula Slade on 10/16/19. His right upper extremity incision is well healed.Patent is without signs or symptoms of steal syndrome  He dialyzes MWF. He will be scheduled on a non dialysis day  Will schedule him for a second stage right basilic vein transposition by Dr. Marcell Anger, PA-C [facility] Office: [phone]  On call MD: Carlis Abbott

## 2020-01-27 ENCOUNTER — Encounter (HOSPITAL_COMMUNITY): Payer: Self-pay | Admitting: Surgery

## 2020-01-27 ENCOUNTER — Other Ambulatory Visit: Payer: Self-pay

## 2020-01-27 NOTE — Progress Notes (Signed)
Spoke with pt for pre-op call. Pt denies cardiac history. Does have hx of HTN and Diabetes but no longer on any medications for either. Pt last A1C was 6.2 on 11/27/19 and he states his fasting blood sugar is usually around 120-140. Instructed pt to check his blood sugar Thursday AM when he gets up. If blood sugar is >220 take 1/2 of usual correction dose of Novolog insulin. If blood sugar is 70 or below, treat with 1/2 cup of clear juice (apple or cranberry) and recheck blood sugar 15 minutes after drinking juice. If blood sugar continues to be 70 or below, call the Short Stay department and ask to speak to a nurse.  Pt is blind.   Pt unable to get Covid test due to transportation issues. He will need it on arrival.

## 2020-01-29 ENCOUNTER — Encounter (HOSPITAL_COMMUNITY)
Admission: RE | Disposition: A | Discharge status: Home / Self Care | Payer: Self-pay | Source: Home / Self Care | Attending: Surgery

## 2020-01-29 ENCOUNTER — Ambulatory Visit (HOSPITAL_COMMUNITY): Payer: Medicare Other | Admitting: Certified Registered Nurse Anesthetist

## 2020-01-29 ENCOUNTER — Other Ambulatory Visit: Payer: Self-pay

## 2020-01-29 ENCOUNTER — Ambulatory Visit (HOSPITAL_COMMUNITY)
Admission: RE | Admit: 2020-01-29 | Discharge: 2020-01-29 | Disposition: A | Discharge status: Home / Self Care | Payer: Medicare Other | Attending: Surgery | Admitting: Surgery

## 2020-01-29 ENCOUNTER — Encounter (HOSPITAL_COMMUNITY): Payer: Self-pay | Admitting: Surgery

## 2020-01-29 DIAGNOSIS — Z992 Dependence on renal dialysis: Secondary | ICD-10-CM | POA: Insufficient documentation

## 2020-01-29 DIAGNOSIS — N186 End stage renal disease: Secondary | ICD-10-CM | POA: Diagnosis present

## 2020-01-29 DIAGNOSIS — Z20822 Contact with and (suspected) exposure to covid-19: Secondary | ICD-10-CM | POA: Diagnosis not present

## 2020-01-29 DIAGNOSIS — N185 Chronic kidney disease, stage 5: Secondary | ICD-10-CM | POA: Diagnosis not present

## 2020-01-29 HISTORY — PX: BASCILIC VEIN TRANSPOSITION: SHX5742

## 2020-01-29 LAB — POCT I-STAT, CHEM 8
BUN: 26 mg/dL — ABNORMAL HIGH (ref 6–20)
Calcium, Ion: 1.1 mmol/L — ABNORMAL LOW (ref 1.15–1.40)
Chloride: 95 mmol/L — ABNORMAL LOW (ref 98–111)
Creatinine, Ser: 10.4 mg/dL — ABNORMAL HIGH (ref 0.61–1.24)
Glucose, Bld: 126 mg/dL — ABNORMAL HIGH (ref 70–99)
HCT: 36 % — ABNORMAL LOW (ref 39.0–52.0)
Hemoglobin: 12.2 g/dL — ABNORMAL LOW (ref 13.0–17.0)
Potassium: 4.1 mmol/L (ref 3.5–5.1)
Sodium: 137 mmol/L (ref 135–145)
TCO2: 27 mmol/L (ref 22–32)

## 2020-01-29 LAB — GLUCOSE, CAPILLARY
Glucose-Capillary: 137 mg/dL — ABNORMAL HIGH (ref 70–99)
Glucose-Capillary: 112 mg/dL — ABNORMAL HIGH (ref 70–99)
Glucose-Capillary: 99 mg/dL (ref 70–99)

## 2020-01-29 LAB — SARS CORONAVIRUS 2 BY RT PCR (HOSPITAL ORDER, PERFORMED IN ~~LOC~~ HOSPITAL LAB): SARS Coronavirus 2: NEGATIVE

## 2020-01-29 SURGERY — TRANSPOSITION, VEIN, BASILIC
Anesthesia: General | Site: Arm Upper | Laterality: Right

## 2020-01-29 MED ORDER — LIDOCAINE 2% (20 MG/ML) 5 ML SYRINGE
INTRAMUSCULAR | Status: AC
  Filled 2020-01-29: qty 5

## 2020-01-29 MED ORDER — SODIUM CHLORIDE 0.9 % IV SOLN: INTRAVENOUS | Status: DC | PRN

## 2020-01-29 MED ORDER — PHENYLEPHRINE 40 MCG/ML (10ML) SYRINGE FOR IV PUSH (FOR BLOOD PRESSURE SUPPORT)
PREFILLED_SYRINGE | INTRAVENOUS | Status: AC
  Filled 2020-01-29: qty 10

## 2020-01-29 MED ORDER — CEFAZOLIN SODIUM-DEXTROSE 2-4 GM/100ML-% IV SOLN
2.0000 g | INTRAVENOUS | Status: AC
  Administered 2020-01-29: 2 g via INTRAVENOUS
  Filled 2020-01-29: qty 100

## 2020-01-29 MED ORDER — FENTANYL CITRATE (PF) 250 MCG/5ML IJ SOLN
INTRAMUSCULAR | Status: AC
  Filled 2020-01-29: qty 5

## 2020-01-29 MED ORDER — ONDANSETRON HCL 4 MG/2ML IJ SOLN
INTRAMUSCULAR | Status: AC
  Filled 2020-01-29: qty 2

## 2020-01-29 MED ORDER — SODIUM CHLORIDE FLUSH 0.9 % IV SOLN: INTRAVENOUS | Status: DC | PRN

## 2020-01-29 MED ORDER — BUPIVACAINE LIPOSOME 1.3 % IJ SUSP
20.0000 mL | Freq: Once | INTRAMUSCULAR | Status: DC
  Filled 2020-01-29: qty 20

## 2020-01-29 MED ORDER — PROPOFOL 10 MG/ML IV BOLUS
INTRAVENOUS | Status: DC | PRN
  Administered 2020-01-29: 100 mg via INTRAVENOUS
  Administered 2020-01-29: 40 mg via INTRAVENOUS

## 2020-01-29 MED ORDER — PHENYLEPHRINE HCL (PRESSORS) 10 MG/ML IV SOLN
INTRAVENOUS | Status: AC
  Filled 2020-01-29: qty 1

## 2020-01-29 MED ORDER — PHENYLEPHRINE 40 MCG/ML (10ML) SYRINGE FOR IV PUSH (FOR BLOOD PRESSURE SUPPORT)
PREFILLED_SYRINGE | INTRAVENOUS | Status: DC | PRN
  Administered 2020-01-29 (×5): 80 ug via INTRAVENOUS

## 2020-01-29 MED ORDER — MIDAZOLAM HCL 2 MG/2ML IJ SOLN
INTRAMUSCULAR | Status: AC
  Filled 2020-01-29: qty 2

## 2020-01-29 MED ORDER — ONDANSETRON HCL 4 MG/2ML IJ SOLN
INTRAMUSCULAR | Status: DC | PRN
  Administered 2020-01-29: 4 mg via INTRAVENOUS

## 2020-01-29 MED ORDER — ORAL CARE MOUTH RINSE: 15.0000 mL | Freq: Once | OROMUCOSAL | Status: AC

## 2020-01-29 MED ORDER — PHENYLEPHRINE HCL-NACL 10-0.9 MG/250ML-% IV SOLN
INTRAVENOUS | Status: DC | PRN
  Administered 2020-01-29: 50 ug/min via INTRAVENOUS

## 2020-01-29 MED ORDER — CHLORHEXIDINE GLUCONATE 4 % EX LIQD: 60.0000 mL | Freq: Once | CUTANEOUS | Status: DC

## 2020-01-29 MED ORDER — CHLORHEXIDINE GLUCONATE 0.12 % MT SOLN
15.0000 mL | Freq: Once | OROMUCOSAL | Status: AC
  Administered 2020-01-29: 15 mL via OROMUCOSAL
  Filled 2020-01-29: qty 15

## 2020-01-29 MED ORDER — GLYCOPYRROLATE PF 0.2 MG/ML IJ SOSY
PREFILLED_SYRINGE | INTRAMUSCULAR | Status: AC
  Filled 2020-01-29: qty 3

## 2020-01-29 MED ORDER — GLYCOPYRROLATE 0.2 MG/ML IJ SOLN
INTRAMUSCULAR | Status: DC | PRN
  Administered 2020-01-29: .4 mg via INTRAVENOUS

## 2020-01-29 MED ORDER — FENTANYL CITRATE (PF) 250 MCG/5ML IJ SOLN
INTRAMUSCULAR | Status: DC | PRN
  Administered 2020-01-29: 100 ug via INTRAVENOUS
  Administered 2020-01-29 (×2): 25 ug via INTRAVENOUS
  Administered 2020-01-29 (×2): 50 ug via INTRAVENOUS

## 2020-01-29 MED ORDER — BUPIVACAINE HCL (PF) 0.5 % IJ SOLN
INTRAMUSCULAR | Status: AC
  Filled 2020-01-29: qty 30

## 2020-01-29 MED ORDER — PROPOFOL 10 MG/ML IV BOLUS
INTRAVENOUS | Status: AC
  Filled 2020-01-29: qty 20

## 2020-01-29 MED ORDER — ESMOLOL HCL 100 MG/10ML IV SOLN
INTRAVENOUS | Status: DC | PRN
Start: 2020-01-29 — End: 2020-01-29
  Administered 2020-01-29 (×3): 20 mg via INTRAVENOUS

## 2020-01-29 MED ORDER — 0.9 % SODIUM CHLORIDE (POUR BTL) OPTIME
TOPICAL | Status: DC | PRN
  Administered 2020-01-29: 1000 mL

## 2020-01-29 MED ORDER — SODIUM CHLORIDE 0.9 % IV SOLN
INTRAVENOUS | Status: AC
  Filled 2020-01-29: qty 1.2

## 2020-01-29 MED ORDER — MIDAZOLAM HCL 2 MG/2ML IJ SOLN
INTRAMUSCULAR | Status: DC | PRN
  Administered 2020-01-29: 2 mg via INTRAVENOUS

## 2020-01-29 MED ORDER — ROCURONIUM BROMIDE 10 MG/ML (PF) SYRINGE
PREFILLED_SYRINGE | INTRAVENOUS | Status: DC | PRN
  Administered 2020-01-29: 10 mg via INTRAVENOUS
  Administered 2020-01-29: 40 mg via INTRAVENOUS

## 2020-01-29 MED ORDER — SODIUM CHLORIDE 0.9 % IV SOLN: INTRAVENOUS | Status: DC

## 2020-01-29 MED ORDER — ROCURONIUM BROMIDE 10 MG/ML (PF) SYRINGE
PREFILLED_SYRINGE | INTRAVENOUS | Status: AC
  Filled 2020-01-29: qty 10

## 2020-01-29 MED ORDER — NEOSTIGMINE METHYLSULFATE 3 MG/3ML IV SOSY
PREFILLED_SYRINGE | INTRAVENOUS | Status: AC
  Filled 2020-01-29: qty 3

## 2020-01-29 MED ORDER — HYDROCODONE-ACETAMINOPHEN 5-325 MG PO TABS: 1.0000 | ORAL_TABLET | Freq: Four times a day (QID) | ORAL | 0 refills | Status: DC | PRN

## 2020-01-29 MED ORDER — LIDOCAINE 2% (20 MG/ML) 5 ML SYRINGE
INTRAMUSCULAR | Status: DC | PRN
  Administered 2020-01-29: 40 mg via INTRAVENOUS

## 2020-01-29 MED ORDER — NEOSTIGMINE METHYLSULFATE 10 MG/10ML IV SOLN
INTRAVENOUS | Status: DC | PRN
Start: 2020-01-29 — End: 2020-01-29
  Administered 2020-01-29: 5 mg via INTRAVENOUS

## 2020-01-29 SURGICAL SUPPLY — 41 items
ARMBAND PINK RESTRICT EXTREMIT (MISCELLANEOUS) ×3 IMPLANT
BNDG ELASTIC 4X5.8 VLCR STR LF (GAUZE/BANDAGES/DRESSINGS) ×6 IMPLANT
CANISTER SUCT 3000ML PPV (MISCELLANEOUS) ×3 IMPLANT
CLIP VESOCCLUDE MED 24/CT (CLIP) ×3 IMPLANT
CLIP VESOCCLUDE MED 6/CT (CLIP) ×3 IMPLANT
CLIP VESOCCLUDE SM WIDE 24/CT (CLIP) IMPLANT
CLIP VESOCCLUDE SM WIDE 6/CT (CLIP) IMPLANT
CONT SPEC 4OZ CLIKSEAL STRL BL (MISCELLANEOUS) ×3 IMPLANT
COVER PROBE W GEL 5X96 (DRAPES) ×3 IMPLANT
COVER WAND RF STERILE (DRAPES) IMPLANT
DERMABOND ADVANCED (GAUZE/BANDAGES/DRESSINGS) ×2
DERMABOND ADVANCED .7 DNX12 (GAUZE/BANDAGES/DRESSINGS) ×1 IMPLANT
ELECT REM PT RETURN 9FT ADLT (ELECTROSURGICAL) ×3
ELECTRODE REM PT RTRN 9FT ADLT (ELECTROSURGICAL) ×1 IMPLANT
GLOVE BIOGEL PI IND STRL 6.5 (GLOVE) ×1 IMPLANT
GLOVE BIOGEL PI IND STRL 7.5 (GLOVE) ×1 IMPLANT
GLOVE BIOGEL PI INDICATOR 6.5 (GLOVE) ×2
GLOVE BIOGEL PI INDICATOR 7.5 (GLOVE) ×2
GLOVE ECLIPSE 7.5 STRL STRAW (GLOVE) ×3 IMPLANT
GLOVE SURG SS PI 7.5 STRL IVOR (GLOVE) ×3 IMPLANT
GOWN STRL REUS W/ TWL LRG LVL3 (GOWN DISPOSABLE) IMPLANT
GOWN STRL REUS W/ TWL XL LVL3 (GOWN DISPOSABLE) ×3 IMPLANT
GOWN STRL REUS W/TWL LRG LVL3 (GOWN DISPOSABLE)
GOWN STRL REUS W/TWL XL LVL3 (GOWN DISPOSABLE) ×9
HEMOSTAT SNOW SURGICEL 2X4 (HEMOSTASIS) IMPLANT
KIT BASIN OR (CUSTOM PROCEDURE TRAY) ×3 IMPLANT
KIT TURNOVER KIT B (KITS) ×3 IMPLANT
LOOP VESSEL MAXI BLUE (MISCELLANEOUS) ×3 IMPLANT
NEEDLE 22X1 1/2 (OR ONLY) (NEEDLE) ×3 IMPLANT
NS IRRIG 1000ML POUR BTL (IV SOLUTION) ×3 IMPLANT
PACK CV ACCESS (CUSTOM PROCEDURE TRAY) ×3 IMPLANT
PAD ARMBOARD 7.5X6 YLW CONV (MISCELLANEOUS) ×6 IMPLANT
SUT PROLENE 6 0 CC (SUTURE) ×3 IMPLANT
SUT SILK 2 0 SH (SUTURE) IMPLANT
SUT VIC AB 3-0 SH 27 (SUTURE) ×6
SUT VIC AB 3-0 SH 27X BRD (SUTURE) ×2 IMPLANT
SUT VICRYL 4-0 PS2 18IN ABS (SUTURE) ×6 IMPLANT
SYR 20ML LL LF (SYRINGE) ×6 IMPLANT
TOWEL GREEN STERILE (TOWEL DISPOSABLE) ×3 IMPLANT
UNDERPAD 30X36 HEAVY ABSORB (UNDERPADS AND DIAPERS) ×3 IMPLANT
WATER STERILE IRR 1000ML POUR (IV SOLUTION) ×3 IMPLANT

## 2020-01-29 NOTE — Discharge Instructions (Signed)
° °  [facility]  Discharge Instructions   CAN TAKE ACE WRAP OFF IN 24 HOURS   AV Fistula or Graft Surgery for Dialysis Access  Please refer to the following instructions for your post-procedure care. Your surgeon or physician assistant will discuss any changes with you.  Activity  You may drive the day following your surgery, if you are comfortable and no longer taking prescription pain medication. Resume full activity as the soreness in your incision resolves.  Bathing/Showering  You may shower after you go home. Keep your incision dry for 48 hours. Do not soak in a bathtub, hot tub, or swim until the incision heals completely. You may not shower if you have a hemodialysis catheter.  Incision Care  Clean your incision with mild soap and water after 48 hours. Pat the area dry with a clean towel. You do not need a bandage unless otherwise instructed. Do not apply any ointments or creams to your incision. You may have skin glue on your incision. Do not peel it off. It will come off on its own in about one week. Your arm may swell a bit after surgery. To reduce swelling use pillows to elevate your arm so it is above your heart. Your doctor will tell you if you need to lightly wrap your arm with an ACE bandage.  Diet  Resume your normal diet. There are not special food restrictions following this procedure. In order to heal from your surgery, it is CRITICAL to get adequate nutrition. Your body requires vitamins, minerals, and protein. Vegetables are the best source of vitamins and minerals. Vegetables also provide the perfect balance of protein. Processed food has little nutritional value, so try to avoid this.  Medications  Resume taking all of your medications. If your incision is causing pain, you may take over-the counter pain relievers such as acetaminophen (Tylenol). If you were prescribed a stronger pain medication, please be aware these medications  can cause nausea and constipation. Prevent nausea by taking the medication with a snack or meal. Avoid constipation by drinking plenty of fluids and eating foods with high amount of fiber, such as fruits, vegetables, and grains. Do not take Tylenol if you are taking prescription pain medications.     Follow up Your surgeon may want to see you in the office following your access surgery. If so, this will be arranged at the time of your surgery.  Please call us immediately for any of the following conditions:  Increased pain, redness, drainage (pus) from your incision site Fever of 101 degrees or higher Severe or worsening pain at your incision site Hand pain or numbness.  Reduce your risk of vascular disease:  Stop smoking. If you would like help, call [facility] at 1-800-QUIT-NOW 585-225-3232) or [place] [facility] [place] your cholesterol Maintain a desired weight Control your diabetes Keep your blood pressure down  Dialysis  It will take several weeks to several months for your new dialysis access to be ready for use. Your surgeon will determine when it is OK to use it. Your nephrologist will continue to direct your dialysis. You can continue to use your Permcath until your new access is ready for use.  If you have any questions, please call the office at [phone].

## 2020-01-29 NOTE — Interval H&P Note (Signed)
History and Physical Interval Note:  01/29/2020 8:49 AM  Corey Park  has presented today for surgery, with the diagnosis of END STAGE RENAL DISEASE.  The various methods of treatment have been discussed with the patient and family. After consideration of risks, benefits and other options for treatment, the patient has consented to  Procedure(s): RIGHT ARM SECOND STAGE [place] (Right) as a surgical intervention.  The patient's history has been reviewed, patient examined, no change in status, stable for surgery.  I have reviewed the patient's chart and labs.  Questions were answered to the patient's satisfaction.     [name]

## 2020-01-29 NOTE — Anesthesia Procedure Notes (Signed)
Procedure Name: Intubation Date/Time: 01/29/2020 10:50 AM Performed by: Michele Rockers, CRNA Pre-anesthesia Checklist: Patient identified, Emergency Drugs available, Suction available and Patient being monitored Patient Re-evaluated:Patient Re-evaluated prior to induction Oxygen Delivery Method: Circle system utilized Preoxygenation: Pre-oxygenation with 100% oxygen Induction Type: IV induction Ventilation: Mask ventilation without difficulty Laryngoscope Size: Miller and 3 Grade View: Grade II Tube type: Oral Tube size: 7.5 mm Number of attempts: 1 Airway Equipment and Method: Stylet and Oral airway Placement Confirmation: ETT inserted through vocal cords under direct vision,  positive ETCO2 and breath sounds checked- equal and bilateral Secured at: 23 cm Tube secured with: Tape Dental Injury: Teeth and Oropharynx as per pre-operative assessment

## 2020-01-29 NOTE — Anesthesia Preprocedure Evaluation (Addendum)
Anesthesia Evaluation  Patient identified by MRN, date of birth, ID band Patient awake    Reviewed: Allergy & Precautions, H&P , NPO status , Patient's Chart, lab work & pertinent test results  Airway Mallampati: II  TM Distance: >3 FB Neck ROM: Full    Dental no notable dental hx. (+) Teeth Intact, Dental Advisory Given   Pulmonary asthma ,    Pulmonary exam normal breath sounds clear to auscultation       Cardiovascular hypertension, +CHF   Rhythm:Regular Rate:Normal     Neuro/Psych negative neurological ROS  negative psych ROS   GI/Hepatic negative GI ROS, Neg liver ROS,   Endo/Other  diabetes  Renal/GU ESRF and DialysisRenal disease  negative genitourinary   Musculoskeletal   Abdominal   Peds  Hematology  (+) Blood dyscrasia, anemia ,   Anesthesia Other Findings   Reproductive/Obstetrics negative OB ROS                            Anesthesia Physical Anesthesia Plan  ASA: III  Anesthesia Plan: General   Post-op Pain Management:    Induction: Intravenous  PONV Risk Score and Plan: 3 and Ondansetron and Midazolam  Airway Management Planned: Oral ETT  Additional Equipment:   Intra-op Plan:   Post-operative Plan: Extubation in OR  Informed Consent: I have reviewed the patients History and Physical, chart, labs and discussed the procedure including the risks, benefits and alternatives for the proposed anesthesia with the patient or authorized representative who has indicated his/her understanding and acceptance.     Dental advisory given  Plan Discussed with: CRNA  Anesthesia Plan Comments:         Anesthesia Quick Evaluation

## 2020-01-29 NOTE — Op Note (Signed)
    Patient name: Corey Park MRN: [id number] DOB: Mar 11, 1971 Sex: male  01/29/2020 Pre-operative Diagnosis: End-stage renal disease Post-operative diagnosis:  Same Surgeon:  [name] Assistants: Arlee Muslim Procedure:   Second stage right basilic vein fistula creation Anesthesia: General Blood Loss: Minimal Specimens: None  Findings: Excellent maturation of the basilic vein fistula.  The vein was fully mobilized and translocated.  The venous anastomosis was near the antecubital crease.  Exparel was used for anesthesia.  Indications: The patient has previously undergone right basilic vein fistula creation, for stage.  Preoperative ultrasound showed good maturation.  He comes in today for second stage.  Procedure:  The patient was identified in the holding area and taken to [place] [address]  The patient was then placed supine on the table. general anesthesia was administered.  The patient was prepped and draped in the usual sterile fashion.  A time out was called and antibiotics were administered.  A PA was necessary for suction retraction and suture closure.  Ultrasound was used to evaluate the vein in the upper arm.  It was of excellent caliber measuring 6 mm throughout.  Exparel was used for local anesthesia.  2 longitudinal incisions were made over the basilic vein.  Cautery was used to divide subcutaneous tissue.  The basilic vein was then circumferentially mobilized from the antecubital crease up to the axilla.  Several side branches were ligated between silk ties.  Once the vein was fully mobilized it was marked for orientation.  I then created a subcutaneous tunnel with a curved Gore tunneler.  The basilic vein was then occluded with baby Gregory clamps and transected near the antecubital crease.  The vein was then brought through the previously created tunnel making sure to maintain proper orientation.  A end to end anastomosis was then performed with running 6-0 Prolene.  Prior to  completion the appropriate flushing maneuvers were performed and the anastomosis was completed.  There was excellent thrill within the fistula.  There were no kinks.  The wound was irrigated.  Hemostasis was achieved.  The incisions were closed with 2 layers of Vicryl followed by Dermabond.  The arm was wrapped with an Ace bandage.  There were no immediate complications.   Disposition: To PACU stable.   Corey Park, M.D., [facility] [place] Office: [phone] Pager:  [phone]

## 2020-01-29 NOTE — Transfer of Care (Signed)
Immediate Anesthesia Transfer of Care Note  Patient: Corey Park  Procedure(s) Performed: RIGHT ARM SECOND STAGE BASCILIC VEIN TRANSPOSITION (Right Arm Upper)  Patient Location: PACU  Anesthesia Type:General  Level of Consciousness: awake, oriented and patient cooperative  Airway & Oxygen Therapy: Patient Spontanous Breathing and Patient connected to face mask oxygen  Post-op Assessment: Report given to RN, Post -op Vital signs reviewed and stable and Patient moving all extremities X 4  Post vital signs: Reviewed and stable  Last Vitals:  Vitals Value Taken Time  BP 142/95 01/29/20 1240  Temp    Pulse 80 01/29/20 1242  Resp 9 01/29/20 1242  SpO2 100 % 01/29/20 1242  Vitals shown include unvalidated device data.  Last Pain:  Vitals:   01/29/20 0739  TempSrc:   PainSc: 0-No pain      Patients Stated Pain Goal: 3 (22/63/33 5456)  Complications: No complications documented.

## 2020-01-29 NOTE — Anesthesia Postprocedure Evaluation (Signed)
Anesthesia Post Note  Patient: Agricultural engineer  Procedure(s) Performed: RIGHT ARM SECOND STAGE BASCILIC VEIN TRANSPOSITION (Right Arm Upper)     Patient location during evaluation: PACU Anesthesia Type: General Level of consciousness: awake and alert Pain management: pain level controlled Vital Signs Assessment: post-procedure vital signs reviewed and stable Respiratory status: spontaneous breathing, nonlabored ventilation and respiratory function stable Cardiovascular status: blood pressure returned to baseline and stable Postop Assessment: no apparent nausea or vomiting Anesthetic complications: no   No complications documented.  Last Vitals:  Vitals:   01/29/20 1325 01/29/20 1329  BP: 134/84   Pulse: 81 83  Resp: 20 18  Temp:  36.8 C  SpO2: 96% 93%    Last Pain:  Vitals:   01/29/20 1329  TempSrc:   PainSc: 0-No pain                 Beverlyann Broxterman,W. EDMOND

## 2020-01-30 ENCOUNTER — Encounter (HOSPITAL_COMMUNITY): Payer: Self-pay | Admitting: Surgery

## 2020-02-04 ENCOUNTER — Other Ambulatory Visit (HOSPITAL_COMMUNITY): Payer: Self-pay | Admitting: Nephrology

## 2020-02-04 DIAGNOSIS — I509 Heart failure, unspecified: Secondary | ICD-10-CM

## 2020-02-19 ENCOUNTER — Other Ambulatory Visit (HOSPITAL_COMMUNITY): Payer: Medicare Other

## 2020-02-26 ENCOUNTER — Encounter (HOSPITAL_COMMUNITY): Payer: Self-pay | Admitting: Nephrology

## 2020-03-02 NOTE — Progress Notes (Deleted)
  POST OPERATIVE OFFICE NOTE    CC:  F/u for surgery  HPI:  This is a 49 y.o. male who is s/p 2nd stage right BVT on 01/29/2020 by Dr. Trula Slade. His 1st stage BVT was on 10/16/2019 also by Dr. Trula Slade. He has a [facility] that was placed by IR on 07/29/2019.    ***  Pt states ***he does *** have pain/numbness in *** hand.    The pt *** on dialysis *** at *** location.   No Known Allergies  Current Outpatient Medications  Medication Sig Dispense Refill  . calcium acetate (PHOSLO) 667 MG tablet Take 1,334 mg by mouth 3 (three) times daily.     [name] HYDROcodone-acetaminophen (NORCO) 5-325 MG tablet Take 1 tablet by mouth every 6 (six) hours as needed for moderate pain. 20 tablet 0  . IRON SUCROSE IV Inject 1 application as directed every Monday, Wednesday, and Friday.     . multivitamin (RENA-VIT) TABS tablet Take 1 tablet by mouth daily.     No current facility-administered medications for this visit.     ROS:  See HPI  Physical Exam:  ***  Incision:  *** Extremities:   There *** a palpable *** pulse.   Motor and sensory *** in tact.   There *** a thrill/bruit present.    Dialysis Duplex on ***: Diameter:  *** Depth:  ***   Assessment/Plan:  This is a 49 y.o. male who is s/p: ***  -the pt does *** have evidence of steal. -the fistula/graft can be used ***. -If pt has a tunneled dialysis catheter and the access has been used successfully to the satisfaction of the dialysis center, the tunneled catheter can be removed at their discretion.   -the pt will follow up ***   *** [facility] [phone]  Clinic MD:  ***

## 2020-03-08 ENCOUNTER — Emergency Department (HOSPITAL_COMMUNITY): Payer: Medicare Other

## 2020-03-08 ENCOUNTER — Encounter (HOSPITAL_COMMUNITY): Payer: Self-pay | Admitting: Emergency Medicine

## 2020-03-08 ENCOUNTER — Emergency Department (HOSPITAL_COMMUNITY)
Admission: EM | Admit: 2020-03-08 | Discharge: 2020-03-08 | Disposition: A | Discharge status: Home / Self Care | Payer: Medicare Other | Attending: Emergency Medicine | Admitting: Emergency Medicine

## 2020-03-08 ENCOUNTER — Telehealth (HOSPITAL_COMMUNITY): Payer: Self-pay | Admitting: Nephrology

## 2020-03-08 ENCOUNTER — Other Ambulatory Visit: Payer: Self-pay

## 2020-03-08 DIAGNOSIS — R197 Diarrhea, unspecified: Secondary | ICD-10-CM | POA: Diagnosis present

## 2020-03-08 DIAGNOSIS — R531 Weakness: Secondary | ICD-10-CM | POA: Diagnosis not present

## 2020-03-08 DIAGNOSIS — Z79899 Other long term (current) drug therapy: Secondary | ICD-10-CM | POA: Insufficient documentation

## 2020-03-08 DIAGNOSIS — R109 Unspecified abdominal pain: Secondary | ICD-10-CM | POA: Insufficient documentation

## 2020-03-08 DIAGNOSIS — N185 Chronic kidney disease, stage 5: Secondary | ICD-10-CM | POA: Diagnosis not present

## 2020-03-08 DIAGNOSIS — E1122 Type 2 diabetes mellitus with diabetic chronic kidney disease: Secondary | ICD-10-CM | POA: Insufficient documentation

## 2020-03-08 DIAGNOSIS — Z8616 Personal history of COVID-19: Secondary | ICD-10-CM | POA: Diagnosis not present

## 2020-03-08 DIAGNOSIS — I5022 Chronic systolic (congestive) heart failure: Secondary | ICD-10-CM | POA: Insufficient documentation

## 2020-03-08 DIAGNOSIS — I132 Hypertensive heart and chronic kidney disease with heart failure and with stage 5 chronic kidney disease, or end stage renal disease: Secondary | ICD-10-CM | POA: Diagnosis not present

## 2020-03-08 DIAGNOSIS — R112 Nausea with vomiting, unspecified: Secondary | ICD-10-CM | POA: Insufficient documentation

## 2020-03-08 LAB — CBC
HCT: 38.3 % — ABNORMAL LOW (ref 39.0–52.0)
Hemoglobin: 12.5 g/dL — ABNORMAL LOW (ref 13.0–17.0)
MCH: 32.5 pg (ref 26.0–34.0)
MCHC: 32.6 g/dL (ref 30.0–36.0)
MCV: 99.5 fL (ref 80.0–100.0)
Platelets: 190 10*3/uL (ref 150–400)
RBC: 3.85 MIL/uL — ABNORMAL LOW (ref 4.22–5.81)
RDW: 14.6 % (ref 11.5–15.5)
WBC: 6.2 10*3/uL (ref 4.0–10.5)
nRBC: 0 % (ref 0.0–0.2)

## 2020-03-08 LAB — COMPREHENSIVE METABOLIC PANEL
ALT: 12 U/L (ref 0–44)
AST: 13 U/L — ABNORMAL LOW (ref 15–41)
Albumin: 3.5 g/dL (ref 3.5–5.0)
Alkaline Phosphatase: 50 U/L (ref 38–126)
Anion gap: 18 — ABNORMAL HIGH (ref 5–15)
BUN: 57 mg/dL — ABNORMAL HIGH (ref 6–20)
CO2: 23 mmol/L (ref 22–32)
Calcium: 9.5 mg/dL (ref 8.9–10.3)
Chloride: 96 mmol/L — ABNORMAL LOW (ref 98–111)
Creatinine, Ser: 19.82 mg/dL — ABNORMAL HIGH (ref 0.61–1.24)
GFR calc Af Amer: 3 mL/min — ABNORMAL LOW (ref >60)
GFR calc non Af Amer: 2 mL/min — ABNORMAL LOW (ref >60)
Glucose, Bld: 173 mg/dL — ABNORMAL HIGH (ref 70–99)
Potassium: 3.7 mmol/L (ref 3.5–5.1)
Sodium: 137 mmol/L (ref 135–145)
Total Bilirubin: 0.7 mg/dL (ref 0.3–1.2)
Total Protein: 8.6 g/dL — ABNORMAL HIGH (ref 6.5–8.1)

## 2020-03-08 LAB — LIPASE, BLOOD: Lipase: 23 U/L (ref 11–51)

## 2020-03-08 MED ORDER — ALUM & MAG HYDROXIDE-SIMETH 200-200-20 MG/5ML PO SUSP
30.0000 mL | Freq: Once | ORAL | Status: AC
  Administered 2020-03-08: 30 mL via ORAL
  Filled 2020-03-08: qty 30

## 2020-03-08 MED ORDER — HYDROMORPHONE HCL 1 MG/ML IJ SOLN
0.5000 mg | Freq: Once | INTRAMUSCULAR | Status: AC
  Administered 2020-03-08: 0.5 mg via INTRAVENOUS
  Filled 2020-03-08: qty 1

## 2020-03-08 MED ORDER — ONDANSETRON HCL 4 MG/2ML IJ SOLN
4.0000 mg | Freq: Once | INTRAMUSCULAR | Status: AC
  Administered 2020-03-08: 4 mg via INTRAVENOUS
  Filled 2020-03-08: qty 2

## 2020-03-08 MED ORDER — ONDANSETRON 4 MG PO TBDP: 4.0000 mg | ORAL_TABLET | Freq: Three times a day (TID) | ORAL | 0 refills | Status: DC | PRN

## 2020-03-08 NOTE — ED Provider Notes (Signed)
[facility] EMERGENCY DEPARTMENT Provider Note   CSN: [id number] Arrival date & time: 03/08/20  9528     History Chief Complaint  Patient presents with   Diarrhea   Weakness    Corey Park is a 49 y.o. male.  49 yo M with a chief complaints of nausea vomiting and diarrhea.  Going on for about 3 days now.  Patient feels that he is not able to eat or drink anything because it then makes him vomit.  Describing some lower crampy abdominal pain.  Denies fevers.  Denies cough or congestion.  The history is provided by the patient.  Diarrhea Associated symptoms: vomiting   Associated symptoms: no abdominal pain, no arthralgias, no chills, no fever, no headaches and no myalgias   Weakness Associated symptoms: diarrhea, nausea and vomiting   Associated symptoms: no abdominal pain, no arthralgias, no chest pain, no fever, no headaches, no myalgias and no shortness of breath   Illness Severity:  Moderate Onset quality:  Gradual Duration:  3 days Timing:  Constant Progression:  Worsening Chronicity:  New Associated symptoms: diarrhea, nausea and vomiting   Associated symptoms: no abdominal pain, no chest pain, no congestion, no fever, no headaches, no myalgias, no rash and no shortness of breath        Past Medical History:  Diagnosis Date   Anemia 2021   Asthma    as a child   Blind    CHF (congestive heart failure) ([place]) 2021   Diabetes mellitus without complication ([place])    type 2   Hypertension    Pneumonia 2021   Renal disorder    dialysis T-TH-Sat    Patient Active Problem List   Diagnosis Date Noted   Cellulitis, perineum 11/26/2019   Shortness of breath 11/10/2019   Pain, unspecified 10/17/2019   Allergy, unspecified, initial encounter 08/25/2019   Coagulation defect, unspecified ([place]) 41/32/4401   Complication of vascular dialysis catheter 08/25/2019   Secondary hyperparathyroidism of renal origin ([place]) 08/25/2019    Type 2 diabetes mellitus with diabetic peripheral angiopathy without gangrene ([place]) 08/25/2019   Unspecified protein-calorie malnutrition ([place]) 08/25/2019   Acute CHF (congestive heart failure) ([place]) 07/27/2019   COVID-19 virus infection 07/27/2019   Anemia in chronic kidney disease (CKD) 12/23/2018   Fe deficiency anemia 12/23/2018   Acute renal failure superimposed on stage 5 chronic kidney disease, not on chronic dialysis (HCC)    Symptomatic anemia 02/72/5366   Chronic systolic heart failure (HCC) 07/03/2018   Dilated cardiomyopathy ([place]) 07/03/2018   Elevated troponin    Chronic kidney disease, stage V ([place]) 06/17/2018   Hypomagnesemia 12/13/2016   Hypertensive urgency 12/11/2016   AKI (acute kidney injury) ([place]) 12/11/2016   Fluid overload 12/11/2016   HLD (hyperlipidemia) 06/14/2007   DM (diabetes mellitus), type 2 ([place]) 06/11/2007   Hypertensive heart disease with CHF (congestive heart failure) ([place]) 06/11/2007    Past Surgical History:  Procedure Laterality Date   BASCILIC VEIN TRANSPOSITION Right 10/16/2019   Procedure: Basilic Vein Transposition Right Arm;  Surgeon: Serafina Mitchell, MD;  Location[facility] OR;  Service: Vascular;  Laterality: Right;   BASCILIC VEIN TRANSPOSITION Right 01/29/2020   Procedure: RIGHT ARM SECOND STAGE [place];  Surgeon: Serafina Mitchell, MD;  Location: [place];  Service: Vascular;  Laterality: Right;   BIOPSY  12/22/2018   Procedure: BIOPSY;  Surgeon: Laurence Spates, MD;  Location: [facility] ENDOSCOPY;  Service: Endoscopy;;   ESOPHAGOGASTRODUODENOSCOPY N/A 12/22/2018   Procedure: ESOPHAGOGASTRODUODENOSCOPY (EGD);  Surgeon: Laurence Spates, MD;  Location: [facility] ENDOSCOPY;  Service: Endoscopy;  Laterality: N/A;   IR FLUORO GUIDE CV LINE RIGHT  07/29/2019   IR US GUIDE VASC ACCESS RIGHT  07/29/2019       Family History  Problem Relation Age of Onset   CAD Mother    Hypertension Mother    Diabetes Neg Hx    Stroke Neg  Hx    Cancer Neg Hx    Kidney failure Neg Hx     Social History   Tobacco Use   Smoking status: Never Smoker   Smokeless tobacco: Never Used  Vaping Use   Vaping Use: Never used  Substance Use Topics   Alcohol use: Not Currently   Drug use: Not Currently    Types: Marijuana    Home Medications Prior to Admission medications   Medication Sig Start Date End Date Taking? Authorizing Provider  calcium acetate (PHOSLO) 667 MG tablet Take 1,334 mg by mouth 3 (three) times daily.  09/12/19  Yes [provider]  IRON SUCROSE IV Inject 1 application as directed every Monday, Wednesday, and Friday. Done at dialysis 11/26/19 11/24/20 Yes [provider]  multivitamin (RENA-VIT) TABS tablet Take 1 tablet by mouth daily. 08/29/19  Yes [provider]  HYDROcodone-acetaminophen (NORCO) 5-325 MG tablet Take 1 tablet by mouth every 6 (six) hours as needed for moderate pain. Patient not taking: Reported on 03/08/2020 01/29/20   Dagoberto Ligas, PA-C  ondansetron (ZOFRAN ODT) 4 MG disintegrating tablet Take 1 tablet (4 mg total) by mouth every 8 (eight) hours as needed for nausea or vomiting. 03/08/20   Deno Etienne, DO    Allergies    Patient has no known allergies.  Review of Systems   Review of Systems  Constitutional: Negative for chills and fever.  HENT: Negative for congestion and facial swelling.   Eyes: Negative for discharge and visual disturbance.  Respiratory: Negative for shortness of breath.   Cardiovascular: Negative for chest pain and palpitations.  Gastrointestinal: Positive for diarrhea, nausea and vomiting. Negative for abdominal pain.  Musculoskeletal: Negative for arthralgias and myalgias.  Skin: Negative for color change and rash.  Neurological: Positive for weakness. Negative for tremors, syncope and headaches.  Psychiatric/Behavioral: Negative for confusion and dysphoric mood.    Physical Exam Updated Vital Signs BP 125/89 (BP Location: Right  Arm)    Pulse (!) 105    Temp 98.1 F (36.7 C) (Oral)    Resp 20    Ht 6\' 1"  (1.854 m)    Wt 97.5 kg    SpO2 96%    BMI 28.37 kg/m   Physical Exam Vitals and nursing note reviewed.  Constitutional:      Appearance: He is well-developed.  HENT:     Head: Normocephalic and atraumatic.  Eyes:     Pupils: Pupils are equal, round, and reactive to light.  Neck:     Vascular: No JVD.  Cardiovascular:     Rate and Rhythm: Normal rate and regular rhythm.     Heart sounds: No murmur heard.  No friction rub. No gallop.      Comments: Tunneled dialysis catheter without erythema drainage or tenderness at the site Pulmonary:     Effort: No respiratory distress.     Breath sounds: No wheezing.  Abdominal:     General: There is no distension.     Tenderness: There is abdominal tenderness. There is no guarding or rebound.     Comments: Diffuse to the  lower abdomen  Musculoskeletal:        General: Normal range of motion.     Cervical back: Normal range of motion and neck supple.  Skin:    Coloration: Skin is not pale.     Findings: No rash.  Neurological:     Mental Status: He is alert and oriented to person, place, and time.  Psychiatric:        Behavior: Behavior normal.     ED Results / Procedures / Treatments   Labs (all labs ordered are listed, but only abnormal results are displayed) Labs Reviewed  COMPREHENSIVE METABOLIC PANEL - Abnormal; Notable for the following components:      Result Value   Chloride 96 (*)    Glucose, Bld 173 (*)    BUN 57 (*)    Creatinine, Ser 19.82 (*)    Total Protein 8.6 (*)    AST 13 (*)    GFR calc non Af Amer 2 (*)    GFR calc Af Amer 3 (*)    Anion gap 18 (*)    All other components within normal limits  CBC - Abnormal; Notable for the following components:   RBC 3.85 (*)    Hemoglobin 12.5 (*)    HCT 38.3 (*)    All other components within normal limits  LIPASE, BLOOD  URINALYSIS, ROUTINE W REFLEX MICROSCOPIC     EKG None  Radiology CT ABDOMEN PELVIS WO CONTRAST  Result Date: 03/08/2020 CLINICAL DATA:  Abdominal pain and diarrhea EXAM: CT ABDOMEN AND PELVIS WITHOUT CONTRAST TECHNIQUE: Multidetector CT imaging of the abdomen and pelvis was performed following the standard protocol without oral or IV contrast. COMPARISON:  November 26, 2019 FINDINGS: Lower chest: Lung bases are clear. There are foci of coronary artery calcification. There is an apparent central catheter with tip at the cavoatrial junction. Hepatobiliary: No focal liver lesions are appreciable on this noncontrast enhanced study. Gallbladder wall is not appreciably thickened. There is no biliary duct dilatation. Pancreas: There is no evident pancreatic mass or inflammatory focus. Spleen: No splenic lesions are appreciable. Adrenals/Urinary Tract: Adrenals bilaterally appear normal. Kidneys bilaterally show no evident mass or hydronephrosis on either side. There is no appreciable renal or ureteral calculus on either side. Urinary bladder is midline with urinary bladder decompressed. Urinary bladder wall does not appear appreciably thickened. Stomach/Bowel: There are occasional sigmoid diverticula without diverticular inflammation. No bowel wall thickening or mesenteric thickening. There is no evident bowel obstruction. Terminal ileum appears normal. There is no appreciable free air or portal venous air. Vascular/Lymphatic: There is no abdominal aortic aneurysm. There is aortic and iliac artery atherosclerosis. There is no demonstrable adenopathy in the abdomen or pelvis. Reproductive: Prostate and seminal vesicles are normal in size and contour. Occasional prostatic calculi evident. Other: The appendix appears normal. No evident abscess or ascites in the abdomen or pelvis. There is minimal fat in the umbilicus. Musculoskeletal: There are foci of degenerative change in the lumbar spine. No blastic or lytic bone lesions. There are no intramuscular lesions.  IMPRESSION: 1. Occasional sigmoid diverticula. No evident diverticulitis. No bowel wall thickening. No bowel obstruction. 2.  No abscess in the abdomen or pelvis.  Appendix appears normal. 3. No evident renal or ureteral calculus. No hydronephrosis on either side. Urinary bladder decompressed. 4. Aortic Atherosclerosis (ICD10-I70.0). There are multiple foci of pelvic arterial calcification as well as foci of coronary artery calcification. Electronically Signed   By: Lowella Grip III M.D.   On: 03/08/2020  13:29    Procedures Procedures (including critical care time)  Medications Ordered in ED Medications  ondansetron (ZOFRAN) injection 4 mg (4 mg Intravenous Given 03/08/20 1152)  HYDROmorphone (DILAUDID) injection 0.5 mg (0.5 mg Intravenous Given 03/08/20 1152)  alum & mag hydroxide-simeth (MAALOX/MYLANTA) 200-200-20 MG/5ML suspension 30 mL (30 mLs Oral Given 03/08/20 1404)    ED Course  I have reviewed the triage vital signs and the nursing notes.  Pertinent labs & imaging results that were available during my care of the patient were reviewed by me and considered in my medical decision making (see chart for details).    MDM Rules/Calculators/A&P                          49 yo M with a chief complaint states that he is not been able to eat or drink anything in that timeframe.  Was found to be orthostatic with EMS was given fluids with some improvement.  Lab work with mild worsening uremia with anion gap.  He has some mild lower abdominal pain for me on exam.  Will obtain a CT scan.  Treat pain and nausea.  Reassess.  CT scan without acute intra-abdominal pathology.  Patient is feeling somewhat better and able to tolerate by mouth.  Will discharge the patient home.  I discussed the patient with the dialysis coordinator who confirmed that the patient has dialysis scheduled later today.  We will have him go directly to the dialysis center.  3:14 PM:  I have discussed the  diagnosis/risks/treatment options with the patient and believe the pt to be eligible for discharge home to follow-up with Nephrology. We also discussed returning to the ED immediately if new or worsening sx occur. We discussed the sx which are most concerning (e.g., sudden worsening pain, fever, inability to tolerate by mouth) that necessitate immediate return. Medications administered to the patient during their visit and any new prescriptions provided to the patient are listed below.  Medications given during this visit Medications  ondansetron (ZOFRAN) injection 4 mg (4 mg Intravenous Given 03/08/20 1152)  HYDROmorphone (DILAUDID) injection 0.5 mg (0.5 mg Intravenous Given 03/08/20 1152)  alum & mag hydroxide-simeth (MAALOX/MYLANTA) 200-200-20 MG/5ML suspension 30 mL (30 mLs Oral Given 03/08/20 1404)     The patient appears reasonably screen and/or stabilized for discharge and I doubt any other medical condition or other [facility] requiring further screening, evaluation, or treatment in the ED at this time prior to discharge.   Final Clinical Impression(s) / ED Diagnoses Final diagnoses:  None    Rx / DC Orders ED Discharge Orders         Ordered    ondansetron (ZOFRAN ODT) 4 MG disintegrating tablet  Every 8 hours PRN        03/08/20 [place], [place], DO 03/08/20 1514

## 2020-03-08 NOTE — ED Triage Notes (Signed)
Per EMS, pt from home c/o general weakness and low food intake and diarrhea X2 days. Pt was orthostatic w/ EMS, given 250ccNS which brought pt's blood pressure up to 130/90.  Pt does have dialysis today w/ last Fridays dialysis session going as planned.    CBG 116 110 HR 100% RA 20G L arm  Pt is blind

## 2020-03-08 NOTE — Telephone Encounter (Signed)
Just an FYI. We have made several attempts to contact this patient including sending a letter to schedule or reschedule their echocardiogram. We will be removing the patient from the echo [place].   02/26/20 MAILED LETTER [facility]  02/19/20 Pt No Showed/[facility]  02/04/20 called patient and he could not talk and told me to call him after 4:15 cause he will be on the bus @ 3:08/[facility]  NO PA# needed per ordering provider office    Thank you

## 2020-03-08 NOTE — ED Notes (Signed)
Assisted patient to bathroom.

## 2020-03-08 NOTE — Progress Notes (Signed)
Renal Navigator received call from EDP stating that patient has missed OP HD due to evaluation in ED, but has been deemed stable for discharge. Navigator is aware that patient has a third shift seat at [facility] and can be sent straight there tonight since he is cleared for discharge. Navigator asked that ED staff ask him to cancel his transportation pick up at home, and make sure he asks to remain on the schedule for return trip from clinic to home after treatment. Navigator contacted [facility] to inform that patient should be arriving from ED.  Alphonzo Cruise, [place] Renal Navigator [phone]

## 2020-03-08 NOTE — ED Notes (Signed)
Transported to xray 

## 2020-03-08 NOTE — ED Notes (Signed)
Coke given to drink.

## 2020-03-08 NOTE — ED Notes (Signed)
Temporary dressing placed over catheter site. Taxi voucher provided to patient to [place] note provided until Monday. Patient verbalized understanding of DC instructions, Rx, follow up care

## 2020-03-08 NOTE — Discharge Instructions (Signed)
I talked with the dialysis coordinator and they told me to have you go directly to your dialysis session is typically scheduled.  You can call the transition of care to try and set up your transportation if needed.  If you have any difficulties with this you were asked to call the dialysis coordinator directly at [phone]

## 2020-03-31 DIAGNOSIS — R200 Anesthesia of skin: Secondary | ICD-10-CM

## 2020-03-31 HISTORY — DX: Anesthesia of skin: R20.0

## 2020-04-20 ENCOUNTER — Ambulatory Visit (INDEPENDENT_AMBULATORY_CARE_PROVIDER_SITE_OTHER): Payer: Self-pay | Admitting: Physician Assistant

## 2020-04-20 ENCOUNTER — Other Ambulatory Visit: Payer: Self-pay

## 2020-04-20 ENCOUNTER — Encounter: Payer: Self-pay | Admitting: Physician Assistant

## 2020-04-20 VITALS — BP 123/81 | HR 94 | Temp 98.3°F | Resp 20 | Ht 73.0 in | Wt 218.4 lb

## 2020-04-20 DIAGNOSIS — Z992 Dependence on renal dialysis: Secondary | ICD-10-CM

## 2020-04-20 DIAGNOSIS — N186 End stage renal disease: Secondary | ICD-10-CM

## 2020-04-20 NOTE — Progress Notes (Signed)
    Postoperative Access Visit   History of Present Illness   Corey Park is a 49 y.o. year old male who presents for postoperative follow-up for: right second stage basilic vein transposition by Dr. Trula Slade (Date: 01/29/20).  The patient's wounds are healed.  The patient denies steal symptoms.  The patient is legally blind.  He is dialyzing via R IJ TDC on a MWF schedule at the [facility] location.   Physical Examination   Vitals:   04/20/20 1305  BP: 123/81  Pulse: 94  Resp: 20  Temp: 98.3 F (36.8 C)  TempSrc: Temporal  SpO2: 99%  Weight: 218 lb 6.4 oz (99.1 kg)  Height: 6\' 1"  (1.854 m)   Body mass index is 28.81 kg/m.  right arm Incisions are healed, palpable radial pulse, hand grip is 5/5, sensation in digits is intact, palpable thrill, bruit can be auscultated     Medical Decision Making   Corey Park is a 49 y.o. year old male who presents s/p right second stage basilic vein transposition   Patent R arm AVF without signs or symptoms of steal syndrome  The patient's access is ready for use   The patient's tunneled dialysis catheter can be removed when Nephrology is comfortable with the performance of the fistula  The patient may follow up on a prn basis   Dagoberto Ligas PA-C [facility] Office: [facility] MD: Carlis Abbott

## 2020-07-20 IMAGING — CR DG CHEST 2V
2 series · 2 of 2 positions shown · non-contrast
Comparison: 12/11/2016

CLINICAL DATA: Lower extremity edema, cough, wheezing, symptoms for
1 day, history CHF, hypertension, asthma, diabetes mellitus, renal
disorder

EXAM:
CHEST - 2 VIEW

[w chest pa]
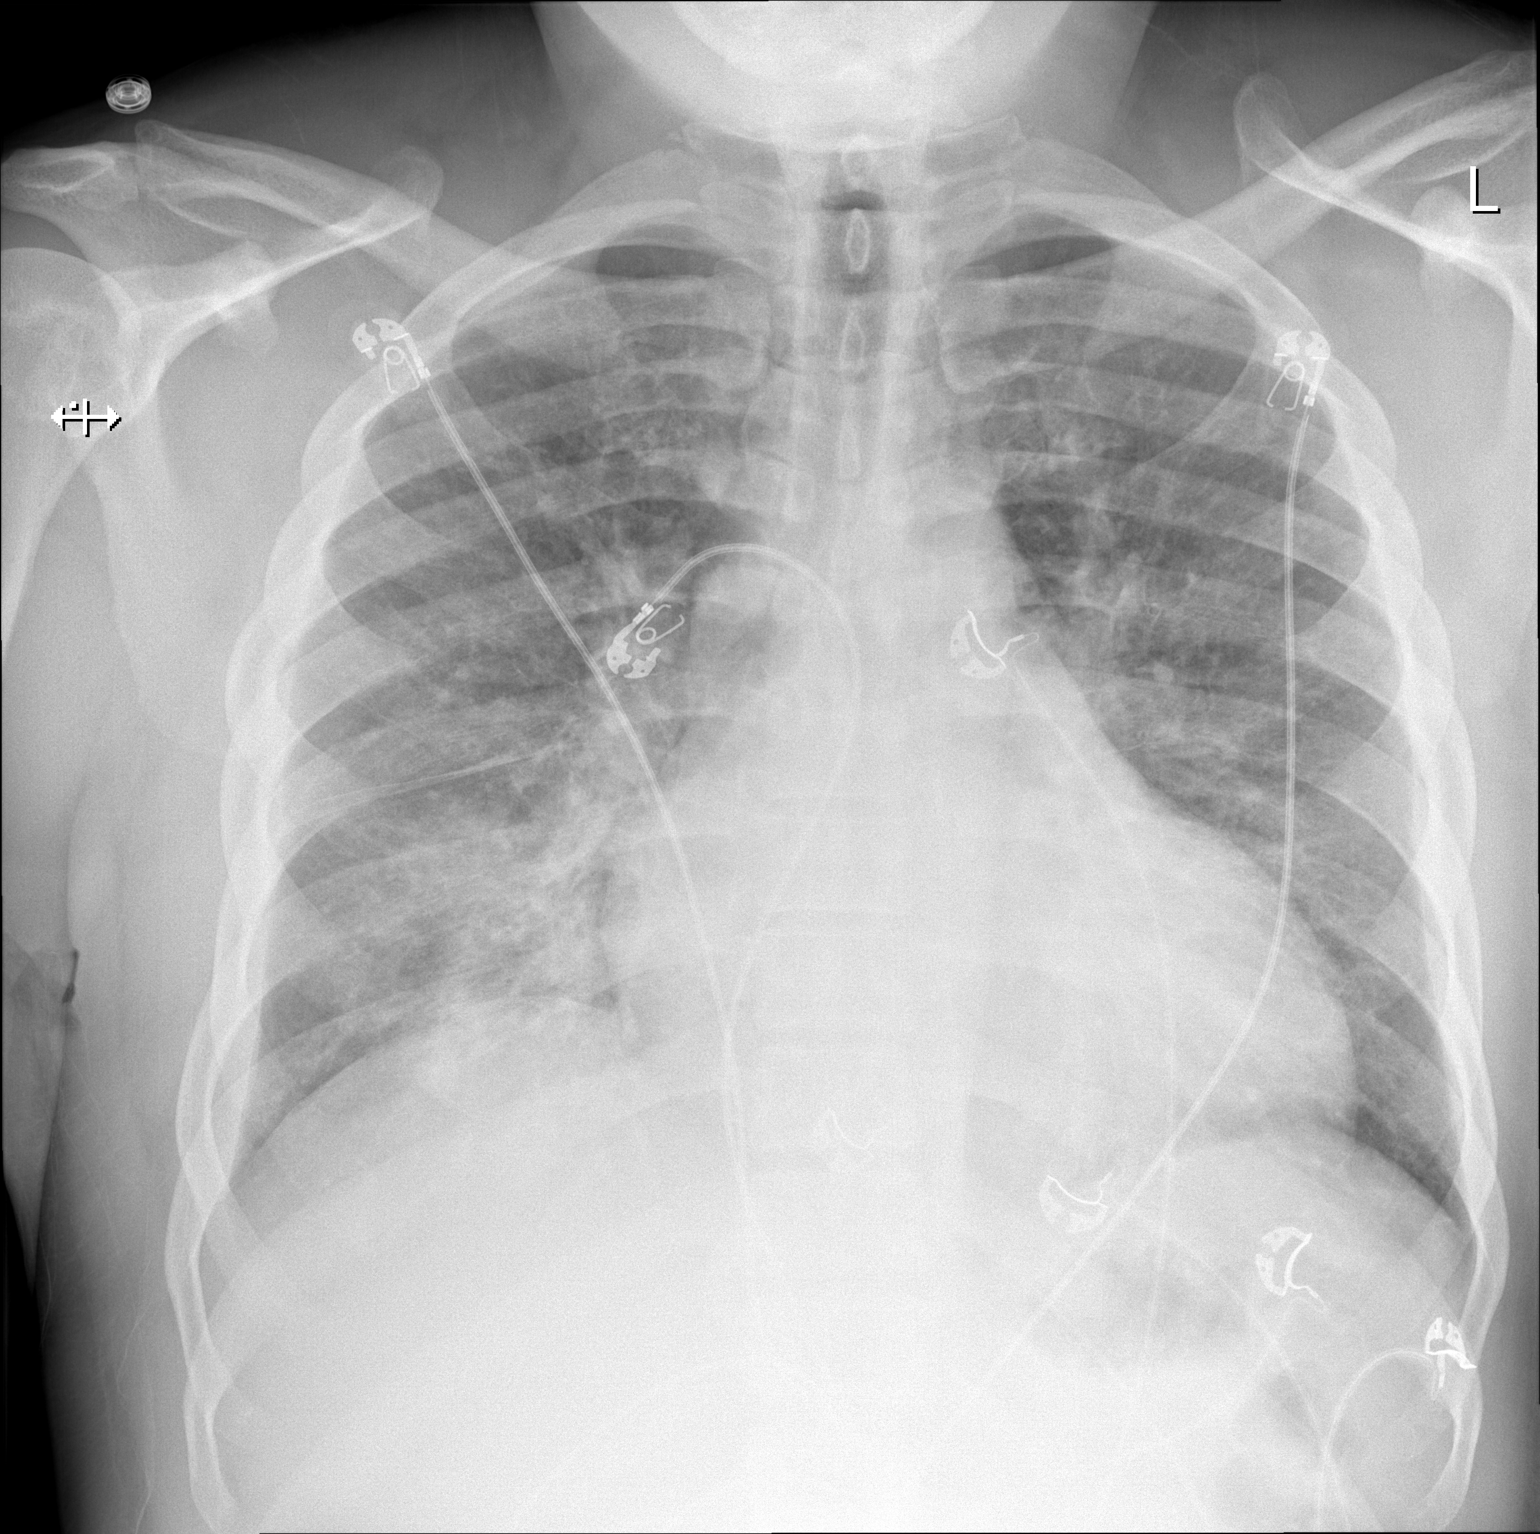

[w chest lat]
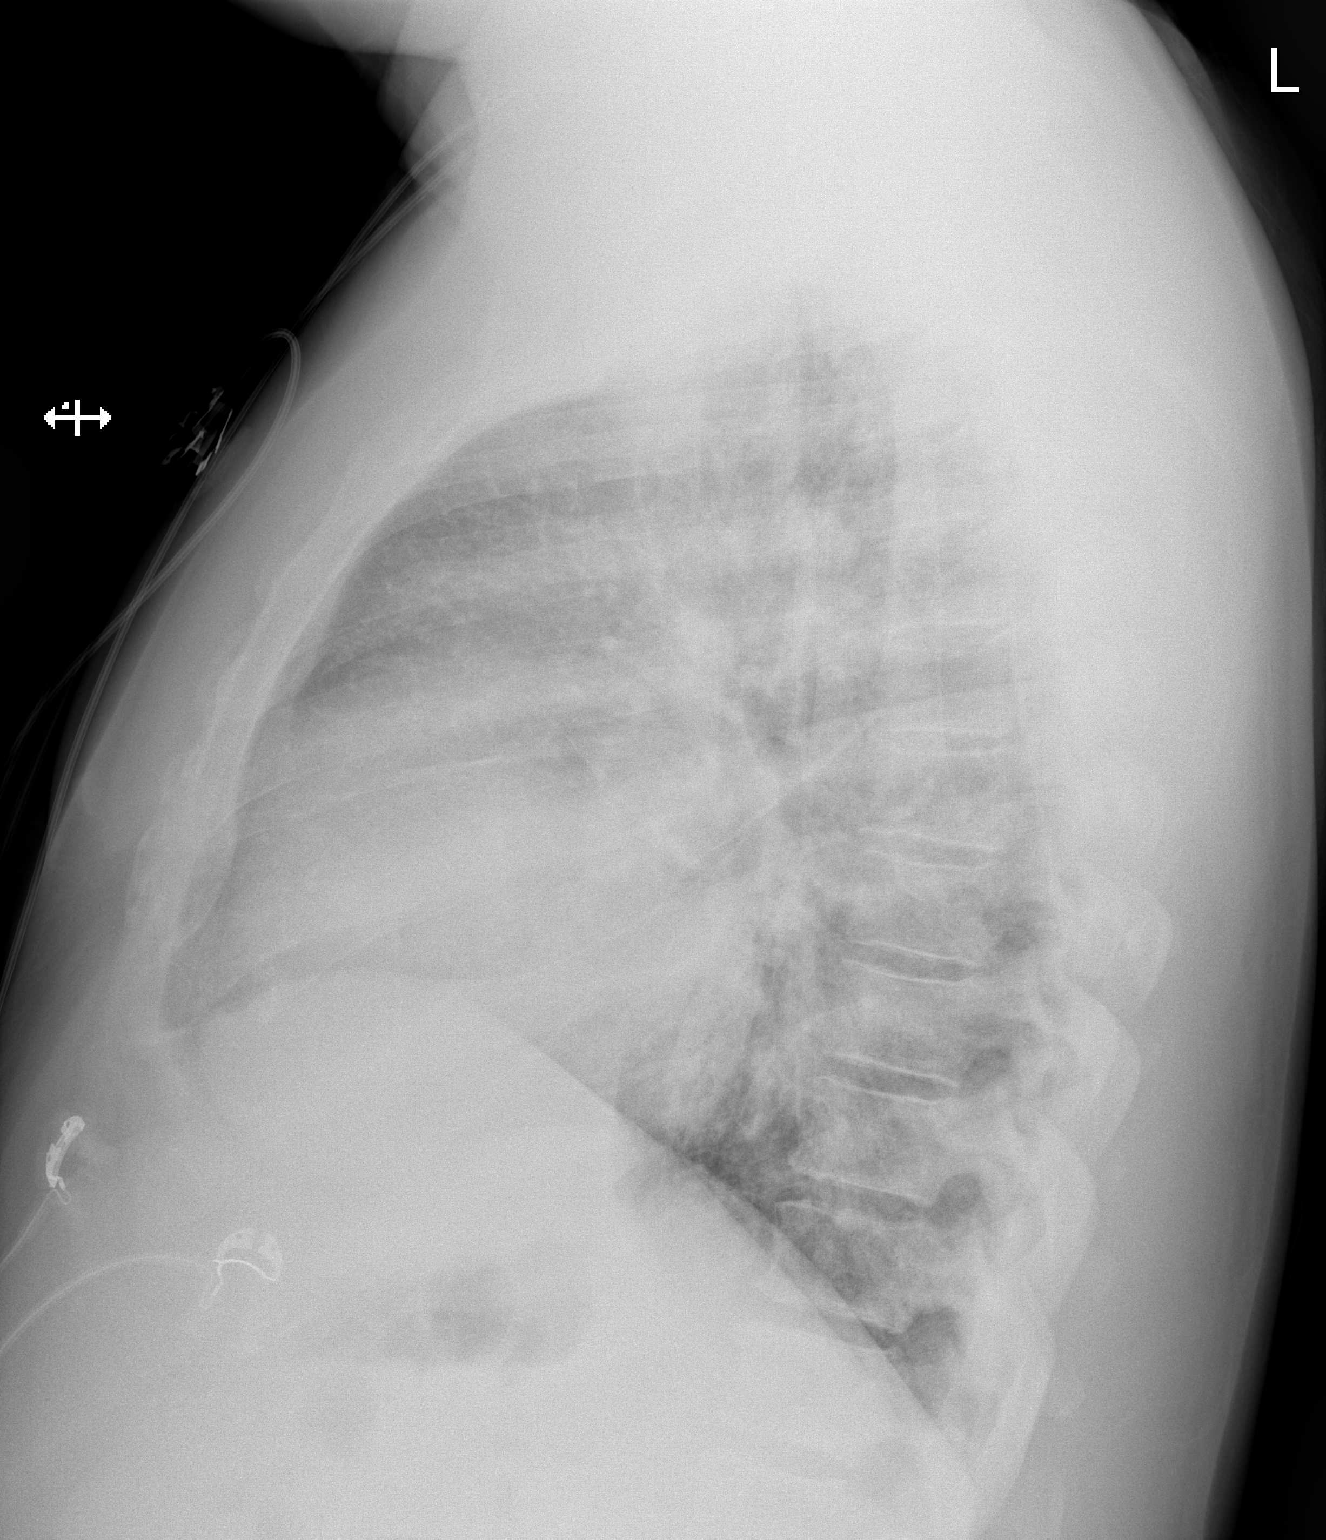

[2 of 2 positions shown; findings below may reference images not displayed]

FINDINGS: Enlargement of cardiac silhouette with pulmonary vascular
congestion.

Mediastinal contours normal.

Slight accentuation of perihilar markings with consolidation in
RIGHT lower lobe most consistent with pneumonia.

Remaining lungs clear.

No pleural effusion or pneumothorax.

Bones unremarkable.
IMPRESSION: Enlargement of cardiac silhouette with pulmonary vascular
congestion.

Slight chronic accentuation of pulmonary markings with new RIGHT
lower lobe infiltrate consistent with pneumonia.

## 2020-08-04 DIAGNOSIS — Z4802 Encounter for removal of sutures: Secondary | ICD-10-CM

## 2020-08-04 HISTORY — DX: Encounter for removal of sutures: Z48.02

## 2020-08-15 ENCOUNTER — Emergency Department (HOSPITAL_COMMUNITY): Payer: Medicare Other

## 2020-08-15 ENCOUNTER — Emergency Department (HOSPITAL_COMMUNITY)
Admission: EM | Admit: 2020-08-15 | Discharge: 2020-08-15 | Disposition: A | Discharge status: Home / Self Care | Payer: Medicare Other | Attending: Emergency Medicine | Admitting: Emergency Medicine

## 2020-08-15 ENCOUNTER — Other Ambulatory Visit: Payer: Self-pay

## 2020-08-15 ENCOUNTER — Encounter (HOSPITAL_COMMUNITY): Payer: Self-pay | Admitting: *Deleted

## 2020-08-15 DIAGNOSIS — J45909 Unspecified asthma, uncomplicated: Secondary | ICD-10-CM | POA: Diagnosis not present

## 2020-08-15 DIAGNOSIS — Z8616 Personal history of COVID-19: Secondary | ICD-10-CM | POA: Diagnosis not present

## 2020-08-15 DIAGNOSIS — R0602 Shortness of breath: Secondary | ICD-10-CM | POA: Insufficient documentation

## 2020-08-15 DIAGNOSIS — I5022 Chronic systolic (congestive) heart failure: Secondary | ICD-10-CM | POA: Insufficient documentation

## 2020-08-15 DIAGNOSIS — R079 Chest pain, unspecified: Secondary | ICD-10-CM | POA: Diagnosis not present

## 2020-08-15 DIAGNOSIS — I132 Hypertensive heart and chronic kidney disease with heart failure and with stage 5 chronic kidney disease, or end stage renal disease: Secondary | ICD-10-CM | POA: Insufficient documentation

## 2020-08-15 DIAGNOSIS — R066 Hiccough: Secondary | ICD-10-CM

## 2020-08-15 DIAGNOSIS — N186 End stage renal disease: Secondary | ICD-10-CM | POA: Insufficient documentation

## 2020-08-15 DIAGNOSIS — Z992 Dependence on renal dialysis: Secondary | ICD-10-CM | POA: Diagnosis not present

## 2020-08-15 DIAGNOSIS — R1010 Upper abdominal pain, unspecified: Secondary | ICD-10-CM | POA: Diagnosis present

## 2020-08-15 DIAGNOSIS — K5900 Constipation, unspecified: Secondary | ICD-10-CM | POA: Insufficient documentation

## 2020-08-15 LAB — CBC
HCT: 31.1 % — ABNORMAL LOW (ref 39.0–52.0)
Hemoglobin: 10.5 g/dL — ABNORMAL LOW (ref 13.0–17.0)
MCH: 32.3 pg (ref 26.0–34.0)
MCHC: 33.8 g/dL (ref 30.0–36.0)
MCV: 95.7 fL (ref 80.0–100.0)
Platelets: 286 10*3/uL (ref 150–400)
RBC: 3.25 MIL/uL — ABNORMAL LOW (ref 4.22–5.81)
RDW: 13.6 % (ref 11.5–15.5)
WBC: 12.8 10*3/uL — ABNORMAL HIGH (ref 4.0–10.5)
nRBC: 0 % (ref 0.0–0.2)

## 2020-08-15 LAB — BASIC METABOLIC PANEL
Anion gap: 15 (ref 5–15)
BUN: 26 mg/dL — ABNORMAL HIGH (ref 6–20)
CO2: 35 mmol/L — ABNORMAL HIGH (ref 22–32)
Calcium: 9.8 mg/dL (ref 8.9–10.3)
Chloride: 89 mmol/L — ABNORMAL LOW (ref 98–111)
Creatinine, Ser: 9.82 mg/dL — ABNORMAL HIGH (ref 0.61–1.24)
GFR, Estimated: 6 mL/min — ABNORMAL LOW (ref >60)
Glucose, Bld: 161 mg/dL — ABNORMAL HIGH (ref 70–99)
Potassium: 3.3 mmol/L — ABNORMAL LOW (ref 3.5–5.1)
Sodium: 139 mmol/L (ref 135–145)

## 2020-08-15 LAB — TROPONIN I (HIGH SENSITIVITY)
Troponin I (High Sensitivity): 59 ng/L — ABNORMAL HIGH (ref <18)
Troponin I (High Sensitivity): 58 ng/L — ABNORMAL HIGH (ref <18)

## 2020-08-15 MED ORDER — POLYETHYLENE GLYCOL 3350 17 G PO PACK
17.0000 g | PACK | Freq: Every day | ORAL | 0 refills | Status: DC | PRN
Start: 2020-08-15 — End: 2021-03-01

## 2020-08-15 NOTE — ED Provider Notes (Signed)
[facility] EMERGENCY DEPARTMENT Provider Note   CSN: [id number] Arrival date & time: 08/15/20  0004     History Chief Complaint  Patient presents with  . Abdominal Pain    Corey Park is a 50 y.o. male.  HPI Patient presents with about 2-week history of hiccups.  States that he was given something at dialysis and really did not help.  Also some fullness in his upper abdomen.  States he has not had a bowel movement since Sunday with today also being Sunday.  States that he had normal his bowel movements almost every day.  States not really passing gas.  Abdomen feels a little more swollen than normal.  No fevers or chills.  No coughing.  Does have some mild shortness of breath.  He is a Monday Wednesday Friday dialysis patient was dialyzed 3 times this week without difficulty.  States he feels as if his volume status is pretty good right now.  No fevers or chills.    Past Medical History:  Diagnosis Date  . Anemia 2021  . Asthma    as a child  . Blind   . CHF (congestive heart failure) ([place]) 2021  . Diabetes mellitus without complication ([place])    type 2  . Hypertension   . Legally blind    B/L  . Pneumonia 2021  . Renal disorder    dialysis T-TH-Sat    Patient Active Problem List   Diagnosis Date Noted  . Hypercalcemia 04/13/2020  . Anesthesia of skin 03/31/2020  . Cellulitis, perineum 11/26/2019  . Shortness of breath 11/10/2019  . Pain, unspecified 10/17/2019  . Allergy, unspecified, initial encounter 08/25/2019  . Coagulation defect, unspecified ([place]) 08/25/2019  . Complication of vascular dialysis catheter 08/25/2019  . Secondary hyperparathyroidism of renal origin ([place]) 08/25/2019  . Type 2 diabetes mellitus with diabetic peripheral angiopathy without gangrene ([place]) 08/25/2019  . Unspecified protein-calorie malnutrition ([place]) 08/25/2019  . Acute CHF (congestive heart failure) ([place]) 07/27/2019  . COVID-19 virus infection 07/27/2019  .  Anemia in chronic kidney disease (CKD) 12/23/2018  . Fe deficiency anemia 12/23/2018  . Acute renal failure superimposed on stage 5 chronic kidney disease, not on chronic dialysis ([place])   . Symptomatic anemia 12/21/2018  . Chronic systolic heart failure ([place]) 07/03/2018  . Dilated cardiomyopathy ([place]) 07/03/2018  . Elevated troponin   . Chronic kidney disease, stage V ([place]) 06/17/2018  . Hypomagnesemia 12/13/2016  . Hypertensive urgency 12/11/2016  . AKI (acute kidney injury) ([place]) 12/11/2016  . Fluid overload 12/11/2016  . HLD (hyperlipidemia) 06/14/2007  . DM (diabetes mellitus), type 2 ([place]) 06/11/2007  . Hypertensive heart disease with CHF (congestive heart failure) ([place]) 06/11/2007    Past Surgical History:  Procedure Laterality Date  . BASCILIC VEIN TRANSPOSITION Right 10/16/2019   Procedure: Basilic Vein Transposition Right Arm;  Surgeon: Serafina Mitchell, MD;  Location: [facility];  Service: Vascular;  Laterality: Right;  . BASCILIC VEIN TRANSPOSITION Right 01/29/2020   Procedure: RIGHT ARM SECOND STAGE BASCILIC VEIN TRANSPOSITION;  Surgeon: Serafina Mitchell, MD;  Location: [facility];  Service: Vascular;  Laterality: Right;  . BIOPSY  12/22/2018   Procedure: BIOPSY;  Surgeon: Laurence Spates, MD;  Location: [facility];  Service: Endoscopy;;  . ESOPHAGOGASTRODUODENOSCOPY N/A 12/22/2018   Procedure: ESOPHAGOGASTRODUODENOSCOPY (EGD);  Surgeon: Laurence Spates, MD;  Location[facility] ENDOSCOPY;  Service: Endoscopy;  Laterality: N/A;  . IR FLUORO GUIDE CV LINE RIGHT  07/29/2019  . IR US GUIDE VASC ACCESS RIGHT  07/29/2019       Family History  Problem Relation Age of Onset  . CAD Mother   . Hypertension Mother   . Diabetes Neg Hx   . Stroke Neg Hx   . Cancer Neg Hx   . Kidney failure Neg Hx     Social History   Tobacco Use  . Smoking status: Never Smoker  . Smokeless tobacco: Never Used  Vaping Use  . Vaping Use: Never used  Substance Use Topics  . Alcohol use: Not Currently  . Drug  use: Not Currently    Types: Marijuana    Home Medications Prior to Admission medications   Medication Sig Start Date End Date Taking? Authorizing Provider  calcium acetate (PHOSLO) 667 MG tablet Take 1,334 mg by mouth 3 (three) times daily.  09/12/19  Yes [provider]  gabapentin (NEURONTIN) 100 MG capsule Take 100 mg by mouth daily as needed (hiccups). 08/12/20  Yes [provider]  IRON SUCROSE IV Inject 1 application as directed every Monday, Wednesday, and Friday. Done at dialysis 11/26/19 11/24/20 Yes [provider]  multivitamin (RENA-VIT) TABS tablet Take 1 tablet by mouth daily. 08/29/19  Yes [provider]  polyethylene glycol (MIRALAX / GLYCOLAX) 17 g packet Take 17 g by mouth daily as needed. 08/15/20  Yes Davonna Belling, MD  HYDROcodone-acetaminophen (NORCO) 5-325 MG tablet Take 1 tablet by mouth every 6 (six) hours as needed for moderate pain. Patient not taking: No sig reported 01/29/20   Dagoberto Ligas, PA-C  ondansetron (ZOFRAN ODT) 4 MG disintegrating tablet Take 1 tablet (4 mg total) by mouth every 8 (eight) hours as needed for nausea or vomiting. Patient not taking: No sig reported 03/08/20   Deno Etienne, DO    Allergies    Patient has no known allergies.  Review of Systems   Review of Systems  Constitutional: Negative for appetite change.  HENT: Negative for congestion.   Respiratory: Negative for shortness of breath.        Hiccups  Cardiovascular: Negative for chest pain.  Gastrointestinal: Positive for abdominal pain and constipation.  Genitourinary: Negative for flank pain.  Musculoskeletal: Negative for back pain.  Skin: Negative for rash.  Neurological: Negative for weakness.  Psychiatric/Behavioral: Negative for confusion.    Physical Exam Updated Vital Signs BP (!) 179/97   Pulse 86   Temp 99 F (37.2 C) (Oral)   Resp 10   Ht 6\' 1"  (1.854 m)   Wt 97.5 kg   SpO2 100%   BMI 28.37 kg/m   Physical Exam Vitals  and nursing note reviewed.  Constitutional:      Appearance: He is well-developed.  HENT:     Head: Normocephalic.  Eyes:     General: No scleral icterus. Cardiovascular:     Rate and Rhythm: Normal rate and regular rhythm.  Pulmonary:     Breath sounds: Normal breath sounds. No wheezing or rhonchi.  Abdominal:     Comments: Mild diffuse abdominal tenderness without rebound guarding or distention.  Skin:    General: Skin is warm.     Capillary Refill: Capillary refill takes less than 2 seconds.  Neurological:     Mental Status: He is alert and oriented to person, place, and time.     ED Results / Procedures / Treatments   Labs (all labs ordered are listed, but only abnormal results are displayed) Labs Reviewed  BASIC METABOLIC PANEL - Abnormal; Notable for the following components:  Result Value   Potassium 3.3 (*)    Chloride 89 (*)    CO2 35 (*)    Glucose, Bld 161 (*)    BUN 26 (*)    Creatinine, Ser 9.82 (*)    GFR, Estimated 6 (*)    All other components within normal limits  CBC - Abnormal; Notable for the following components:   WBC 12.8 (*)    RBC 3.25 (*)    Hemoglobin 10.5 (*)    HCT 31.1 (*)    All other components within normal limits  TROPONIN I (HIGH SENSITIVITY) - Abnormal; Notable for the following components:   Troponin I (High Sensitivity) 59 (*)    All other components within normal limits  TROPONIN I (HIGH SENSITIVITY) - Abnormal; Notable for the following components:   Troponin I (High Sensitivity) 58 (*)    All other components within normal limits    EKG EKG Interpretation  Date/Time:  Sunday August 15 2020 00:12:07 EST Ventricular Rate:  105 PR Interval:  176 QRS Duration: 98 QT Interval:  372 QTC Calculation: 491 R Axis:   -3 Text Interpretation: Sinus tachycardia Minimal voltage criteria for LVH, may be normal variant ( Cornell product ) Borderline ECG Confirmed by Davonna Belling [id number]) on 08/15/2020 12:24:21  PM   Radiology DG Chest 2 View  Result Date: 08/15/2020 CLINICAL DATA:  Hiccups and shortness of breath. EXAM: CHEST - 2 VIEW COMPARISON:  July 27, 2019 FINDINGS: Very mild left basilar atelectasis and/or early infiltrate is noted. There is no evidence of a pleural effusion or pneumothorax. The heart size and mediastinal contours are within normal limits. The visualized skeletal structures are unremarkable. IMPRESSION: Very mild left basilar atelectasis and/or early infiltrate. Electronically Signed   By: Virgina Norfolk M.D.   On: 08/15/2020 01:03   DG Abd 2 Views  Result Date: 08/15/2020 CLINICAL DATA:  Constipation. EXAM: ABDOMEN - 2 VIEW COMPARISON:  None. FINDINGS: Upright film shows no evidence for intraperitoneal free air. There is no evidence for gaseous bowel dilation to suggest obstruction. Visualized bony anatomy unremarkable. IMPRESSION: No evidence for bowel perforation or obstruction. No substantial colonic stool volume. Electronically Signed   By: Misty Stanley M.D.   On: 08/15/2020 13:27    Procedures Procedures   Medications Ordered in ED Medications - No data to display  ED Course  I have reviewed the triage vital signs and the nursing notes.  Pertinent labs & imaging results that were available during my care of the patient were reviewed by me and considered in my medical decision making (see chart for details).    MDM Rules/Calculators/A&P                          Patient presents with hiccups chest pain constipation.  No bowel movement in a week.  Overall benign exam.  Lungs clear.  Pneumonia felt less likely.  Benign abdominal exam but does not have distention.  White count slightly elevated but reassuring.  X-ray does not show obstruction.  Rectal exam showed no stool in the vault.  Although potentially has an enlarged prostate that can be followed as an outpatient.  Discussed with patient about potential constipation.  X-ray did not show a large amount of  stool but will give some MiraLAX to help with gentle bowel movements.  Do for dialysis tomorrow.  Will discharge home.  Hiccups have improved on recheck. Final Clinical Impression(s) / ED Diagnoses Final diagnoses:  Constipation  Hiccups  Constipation, unspecified constipation type  End stage renal disease on dialysis [facility])    Rx / DC Orders ED Discharge Orders         Ordered    polyethylene glycol (MIRALAX / GLYCOLAX) 17 g packet  Daily PRN        08/15/20 1340           Davonna Belling, MD 08/15/20 [phone]

## 2020-08-15 NOTE — ED Notes (Signed)
DC instructions given to pt and a lunch bag at dc time per pt's request, pt AO x 4, verbalized understanding, pt wheeled out by ED staff.

## 2020-08-15 NOTE — ED Triage Notes (Signed)
Pt says that he has had hiccups, has not had a BM since Sunday, belching, unable to pass any gas, feels a fullness in his upper abdomen, that goes into his chest, makes him feel SOB. Gabapentin given at dialysis did not help.

## 2020-08-15 NOTE — ED Triage Notes (Signed)
Arrives via [facility] from home, pt with Multiple complaints. Abdominal pain, radiates to chest, burning pain. Shortness of breath. No bm for 1 week. Hiccups for a couple of weeks. dialysis pt M/W/F. Has not missed any. Prescribed gabapentin at dialysis friday, unchanged symptoms. No meds en route. 12 lead unremarkable. ST on the monitor, bp 168/110, hr 110, rr 20, sats 100, lung sounds clear. 167 cbg.

## 2020-10-26 ENCOUNTER — Ambulatory Visit: Payer: Medicare Other | Admitting: Cardiology

## 2020-11-29 ENCOUNTER — Ambulatory Visit: Payer: Medicare Other | Admitting: Family

## 2020-12-10 ENCOUNTER — Emergency Department (HOSPITAL_COMMUNITY): Payer: Medicare Other

## 2020-12-10 ENCOUNTER — Ambulatory Visit (INDEPENDENT_AMBULATORY_CARE_PROVIDER_SITE_OTHER): Payer: Medicare Other | Admitting: Family

## 2020-12-10 ENCOUNTER — Emergency Department (HOSPITAL_COMMUNITY)
Admission: EM | Admit: 2020-12-10 | Discharge: 2020-12-11 | Disposition: A | Discharge status: Home / Self Care | Payer: Medicare Other | Attending: Emergency Medicine | Admitting: Emergency Medicine

## 2020-12-10 ENCOUNTER — Other Ambulatory Visit: Payer: Self-pay

## 2020-12-10 ENCOUNTER — Encounter (HOSPITAL_COMMUNITY): Payer: Self-pay | Admitting: Emergency Medicine

## 2020-12-10 ENCOUNTER — Encounter: Payer: Self-pay | Admitting: Family

## 2020-12-10 VITALS — BP 160/80 | HR 93 | Ht 73.0 in | Wt 227.0 lb

## 2020-12-10 DIAGNOSIS — R066 Hiccough: Secondary | ICD-10-CM

## 2020-12-10 DIAGNOSIS — I5022 Chronic systolic (congestive) heart failure: Secondary | ICD-10-CM | POA: Diagnosis not present

## 2020-12-10 DIAGNOSIS — Z8616 Personal history of COVID-19: Secondary | ICD-10-CM | POA: Insufficient documentation

## 2020-12-10 DIAGNOSIS — N184 Chronic kidney disease, stage 4 (severe): Secondary | ICD-10-CM | POA: Insufficient documentation

## 2020-12-10 DIAGNOSIS — R142 Eructation: Secondary | ICD-10-CM

## 2020-12-10 DIAGNOSIS — I130 Hypertensive heart and chronic kidney disease with heart failure and stage 1 through stage 4 chronic kidney disease, or unspecified chronic kidney disease: Secondary | ICD-10-CM | POA: Insufficient documentation

## 2020-12-10 DIAGNOSIS — E1122 Type 2 diabetes mellitus with diabetic chronic kidney disease: Secondary | ICD-10-CM | POA: Insufficient documentation

## 2020-12-10 DIAGNOSIS — Z79899 Other long term (current) drug therapy: Secondary | ICD-10-CM | POA: Diagnosis not present

## 2020-12-10 DIAGNOSIS — J45909 Unspecified asthma, uncomplicated: Secondary | ICD-10-CM | POA: Diagnosis not present

## 2020-12-10 DIAGNOSIS — Z20822 Contact with and (suspected) exposure to covid-19: Secondary | ICD-10-CM | POA: Diagnosis not present

## 2020-12-10 DIAGNOSIS — I10 Essential (primary) hypertension: Secondary | ICD-10-CM

## 2020-12-10 LAB — CBC WITH DIFFERENTIAL/PLATELET
Abs Immature Granulocytes: 0.02 10*3/uL (ref 0.00–0.07)
Basophils Absolute: 0 10*3/uL (ref 0.0–0.1)
Basophils Relative: 0 %
Eosinophils Absolute: 0.1 10*3/uL (ref 0.0–0.5)
Eosinophils Relative: 1 %
HCT: 40 % (ref 39.0–52.0)
Hemoglobin: 13.4 g/dL (ref 13.0–17.0)
Immature Granulocytes: 0 %
Lymphocytes Relative: 17 %
Lymphs Abs: 1.8 10*3/uL (ref 0.7–4.0)
MCH: 32 pg (ref 26.0–34.0)
MCHC: 33.5 g/dL (ref 30.0–36.0)
MCV: 95.5 fL (ref 80.0–100.0)
Monocytes Absolute: 1 10*3/uL (ref 0.1–1.0)
Monocytes Relative: 10 %
Neutro Abs: 7.7 10*3/uL (ref 1.7–7.7)
Neutrophils Relative %: 72 %
Platelets: 221 10*3/uL (ref 150–400)
RBC: 4.19 MIL/uL — ABNORMAL LOW (ref 4.22–5.81)
RDW: 14 % (ref 11.5–15.5)
WBC: 10.7 10*3/uL — ABNORMAL HIGH (ref 4.0–10.5)
nRBC: 0 % (ref 0.0–0.2)

## 2020-12-10 LAB — BASIC METABOLIC PANEL
Anion gap: 14 (ref 5–15)
BUN: 27 mg/dL — ABNORMAL HIGH (ref 6–20)
CO2: 33 mmol/L — ABNORMAL HIGH (ref 22–32)
Calcium: 9.4 mg/dL (ref 8.9–10.3)
Chloride: 89 mmol/L — ABNORMAL LOW (ref 98–111)
Creatinine, Ser: 8.26 mg/dL — ABNORMAL HIGH (ref 0.61–1.24)
GFR, Estimated: 7 mL/min — ABNORMAL LOW (ref >60)
Glucose, Bld: 115 mg/dL — ABNORMAL HIGH (ref 70–99)
Potassium: 3.7 mmol/L (ref 3.5–5.1)
Sodium: 136 mmol/L (ref 135–145)

## 2020-12-10 MED ORDER — ONDANSETRON HCL 4 MG/2ML IJ SOLN
4.0000 mg | Freq: Once | INTRAMUSCULAR | Status: AC
  Administered 2020-12-10: 23:00:00 4 mg via INTRAVENOUS
  Filled 2020-12-10: qty 2

## 2020-12-10 MED ORDER — PANTOPRAZOLE SODIUM 40 MG PO TBEC: 40.0000 mg | DELAYED_RELEASE_TABLET | Freq: Every day | ORAL | 2 refills | Status: DC

## 2020-12-10 MED ORDER — SODIUM CHLORIDE 0.9 % IV SOLN: INTRAVENOUS | Status: DC

## 2020-12-10 MED ORDER — FENTANYL CITRATE (PF) 100 MCG/2ML IJ SOLN
50.0000 ug | Freq: Once | INTRAMUSCULAR | Status: AC
  Administered 2020-12-10: 23:00:00 50 ug via INTRAVENOUS
  Filled 2020-12-10: qty 2

## 2020-12-10 MED ORDER — METOCLOPRAMIDE HCL 5 MG/ML IJ SOLN
10.0000 mg | Freq: Once | INTRAMUSCULAR | Status: AC
  Administered 2020-12-10: 23:00:00 10 mg via INTRAVENOUS
  Filled 2020-12-10: qty 2

## 2020-12-10 MED ORDER — ISOSORBIDE MONONITRATE ER 30 MG PO TB24: 30.0000 mg | ORAL_TABLET | Freq: Every day | ORAL | 2 refills | Status: DC

## 2020-12-10 MED ORDER — LORAZEPAM 2 MG/ML IJ SOLN
1.0000 mg | Freq: Once | INTRAMUSCULAR | Status: AC
  Administered 2020-12-10: 23:00:00 1 mg via INTRAVENOUS
  Filled 2020-12-10: qty 1

## 2020-12-10 NOTE — Progress Notes (Signed)
Office Visit    Patient Name: Corey Park Date of Encounter: 12/10/2020  PCP:  Antony Blackbird, MD (Inactive)   [place]  Cardiologist:  Candee Furbish, MD  Advanced Practice Provider:  No care team member to display Electrophysiologist:  None   Chief Complaint    Corey Park is a 50 y.o. male with a hx of COVID-19 hospitalization 08/03/19, ESRD on HD, chronic systolic heart failure, hypertensive cardiomyopathy, moderate secondary pulmonary hypertension, blindness, DM2, HTN presents today for follow up of CHF.   Past Medical History    Past Medical History:  Diagnosis Date   Anemia 2021   Asthma    as a child   Blind    CHF (congestive heart failure) ([place]) 2021   Diabetes mellitus without complication ([place])    type 2   Hypertension    Legally blind    B/L   Pneumonia 2021   Renal disorder    dialysis T-TH-Sat   Past Surgical History:  Procedure Laterality Date   BASCILIC VEIN TRANSPOSITION Right 10/16/2019   Procedure: Basilic Vein Transposition Right Arm;  Surgeon: Serafina Mitchell, MD;  Location: [facility];  Service: Vascular;  Laterality: Right;   [place] Right 01/29/2020   Procedure: RIGHT ARM SECOND STAGE [place];  Surgeon: Serafina Mitchell, MD;  Location: [facility];  Service: Vascular;  Laterality: Right;   BIOPSY  12/22/2018   Procedure: BIOPSY;  Surgeon: Laurence Spates, MD;  Location: [facility] ENDOSCOPY;  Service: Endoscopy;;   ESOPHAGOGASTRODUODENOSCOPY N/A 12/22/2018   Procedure: ESOPHAGOGASTRODUODENOSCOPY (EGD);  Surgeon: Laurence Spates, MD;  Location: [facility] ENDOSCOPY;  Service: Endoscopy;  Laterality: N/A;   IR FLUORO GUIDE CV LINE RIGHT  07/29/2019   IR US GUIDE VASC ACCESS RIGHT  07/29/2019    Allergies  No Known Allergies  History of Present Illness    Corey Park is a 50 y.o. male with a hx of COVID-19 hospitalization 08/03/19, ESRD on HD, chronic systolic heart failure, hypertensive cardiomyopathy,  moderate secondary pulmonary hypertension, blindness, DM2, HTN last seen by Dr. Marlou Porch via telemedicine 07/2019.  He has had multiple admissions for decompensated heart failure.  He was admitted December 2019 with decompensated heart failure and possible pneumonia.  Echo at that time with LVEF 25-30%.  At last telemedicine visit with Dr. Moshe Cipro 08/01/2019 his blood pressure was unable to be evaluated but he was encouraged to continue carvedilol, hydralazine 50 mg 3 times daily.  He was recommended for 3-month follow-up with an app which was not completed.  His previous hiccups were thought to be secondary to uremia with previous EGD June 2020 with anemia from chronic kidney disease with no acute findings.  He was admitted June 2021 with cellulitis of perineum treated with antibiotics.  Since last seen he has had right arm basilic vein transposition with vascular surgery for dialysis access.  He was seen in the ED September 2021 due to nausea and diarrhea.  Repeat ED visit 08/15/2020 due to hiccups and constipation.  He presents today for follow-up with his son.  His son acts as a caretaker and helps him with his medications.  Mr. Cybulski works at [employer] making transition to the [employer].  He reports compliance with dialysis Monday Wednesday Friday and goes at 4:30 PM.  Reports last missed dialysis session of greater than 1 month ago. Notes hiccups and burping ongoing for one month this has been a longstanding issue. Tells me it feels like muscle  contractions.  Does drink carbonated beverages and was encouraged to stop.  Reports no chest pain, pressure, tightness.  Reports dyspnea on exertion that is stable at his baseline.  No edema, orthopnea, PND.  His Imdur and hydralazine have been previously discontinued for unclear reason.  Tells me he thinks the hydralazine caused stomach cramps and his blood pressure was well controlled so they took him off his medications.  EKGs/Labs/Other Studies  Reviewed:   The following studies were reviewed today:   EKG: No EKG today  Recent Labs: 03/08/2020: ALT 12 08/15/2020: BUN 26; Creatinine, Ser 9.82; Hemoglobin 10.5; Platelets 286; Potassium 3.3; Sodium 139  Recent Lipid Panel    Component Value Date/Time   CHOL 167 01/16/2019 1517   TRIG 83 01/16/2019 1517   HDL 41 01/16/2019 1517   CHOLHDL 4.1 01/16/2019 1517   CHOLHDL 4.4 06/17/2018 0444   VLDL 12 06/17/2018 0444   LDLCALC 109 (H) 01/16/2019 1517     Current Meds  Medication Sig   calcium acetate (PHOSLO) 667 MG tablet Take 1,334 mg by mouth 3 (three) times daily.    gabapentin (NEURONTIN) 100 MG capsule Take 100 mg by mouth daily as needed (hiccups).   HYDROcodone-acetaminophen (NORCO) 5-325 MG tablet Take 1 tablet by mouth every 6 (six) hours as needed for moderate pain.   IRON SUCROSE IV Inject 1 application as directed every Monday, Wednesday, and Friday. Done at dialysis   multivitamin (RENA-VIT) TABS tablet Take 1 tablet by mouth daily.   ondansetron (ZOFRAN ODT) 4 MG disintegrating tablet Take 1 tablet (4 mg total) by mouth every 8 (eight) hours as needed for nausea or vomiting.   polyethylene glycol (MIRALAX / GLYCOLAX) 17 g packet Take 17 g by mouth daily as needed.     Review of Systems   All other systems reviewed and are otherwise negative except as noted above.  Physical Exam    VS:  BP (!) 160/80 (BP Location: Left Arm, Patient Position: Sitting, Cuff Size: Normal)   Pulse 93   Ht 6\' 1"  (1.854 m)   Wt 227 lb (103 kg)   SpO2 97%   BMI 29.95 kg/m  , BMI Body mass index is 29.95 kg/m.  Wt Readings from Last 3 Encounters:  12/10/20 227 lb (103 kg)  08/15/20 215 lb (97.5 kg)  04/20/20 218 lb 6.4 oz (99.1 kg)     GEN: Well nourished, overweight, well developed, in no acute distress. HEENT: Blind. Neck: Supple, no JVD, carotid bruits, or masses. Cardiac: RRR, no murmurs, rubs, or gallops. No clubbing, cyanosis, edema.  Radials/PT 2+ and equal  bilaterally.  Respiratory:  Respirations regular and unlabored, clear to auscultation bilaterally. GI: Soft, nontender, nondistended. MS: No deformity or atrophy. Skin: Warm and dry, no rash. Neuro:  Strength and sensation are intact. Psych: Normal affect.  Assessment & Plan    Hypertensive cardiomyopathy / Chronic systolic heart failure - No ACE/ARB/ARNI due to ESRD. Previous headache on increased dose imdur but has tolerated 30 mg in the past.  His blood pressure medications have been discontinued for unclear reason.  He was previously on hydralazine as well as Imdur and tells me he is not taking either medication.  My review of blood pressures from dialysis shows that they are markedly elevated.  He reports no hypotension, lightheadedness, dizziness.  Reports previous stomach cramping with hydralazine. Start Imdur 30 mg daily, likely will need to further uptitrate dose.  Plan to update echocardiogram for reassessment of LVEF.  No indication  for ICD given risk for life-threatening infection if ICD placed.  ESRD on HD -volume management per hemodialysis.  Continue to follow with nephrology.  Intractable hiccups - Previously thought to be secondary to uremia. EGD 12/22/18 no acute findings. Continue to follow with nephrology and PCP. Consistently hiccuping during our examination. Concern for esophageal spasm. Reports no swallowing difficulties.  Start Protonix 40 mg daily.  Reports hiccups and belching have been worsening over the last month.  Will refer to gastroenterology.  Encouraged to avoid carbonated beverages.  Disposition: Follow up in 3 month(s) with Dr. Marlou Porch or APP   Signed, Loel Dubonnet, NP 12/10/2020, 3:42 PM [place]

## 2020-12-10 NOTE — ED Provider Notes (Signed)
[facility] DEPT Provider Note   CSN: [id number] Arrival date & time: 12/10/20  2137     History Chief Complaint  Patient presents with   Hiccups    [facility] is a 50 y.o. male.  HPI He presents for evaluation of ongoing hiccups, for 2 weeks.  Previously they were intermittent, but now they are constant.  He states it makes it difficult to eat or sleep because of the hiccups.  He consistently goes to dialysis, last time was today.  He saw a provider at the cardiology office today and was prescribed pantoprazole and Imdur.  He was referred to GI.  He has not previously seen GI for this problem.  He has had some belching.  He does not have any fever, shortness of breath or cough.  He states the hiccups are so bad he has thought about suicide.  He does not have an active suicidal plan.  There are no other known active modifying factors.  Past Medical History:  Diagnosis Date   Anemia 2021   Asthma    as a child   Blind    CHF (congestive heart failure) ([place]) 2021   Diabetes mellitus without complication ([place])    type 2   Hypertension    Legally blind    B/L   Pneumonia 2021   Renal disorder    dialysis T-TH-Sat    Patient Active Problem List   Diagnosis Date Noted   Hypercalcemia 04/13/2020   Anesthesia of skin 03/31/2020   Cellulitis, perineum 11/26/2019   Shortness of breath 11/10/2019   Pain, unspecified 10/17/2019   Allergy, unspecified, initial encounter 08/25/2019   Coagulation defect, unspecified ([place]) 27/25/3664   Complication of vascular dialysis catheter 08/25/2019   Secondary hyperparathyroidism of renal origin ([place]) 08/25/2019   Type 2 diabetes mellitus with diabetic peripheral angiopathy without gangrene ([place]) 08/25/2019   Unspecified protein-calorie malnutrition ([place]) 08/25/2019   Acute CHF (congestive heart failure) ([place]) 07/27/2019   COVID-19 virus infection 07/27/2019   Anemia in chronic kidney disease (CKD)  12/23/2018   Fe deficiency anemia 12/23/2018   Acute renal failure superimposed on stage 5 chronic kidney disease, not on chronic dialysis (HCC)    Symptomatic anemia 40/34/7425   Chronic systolic heart failure ([place]) 07/03/2018   Dilated cardiomyopathy ([place]) 07/03/2018   Elevated troponin    Chronic kidney disease, stage V ([place]) 06/17/2018   Hypomagnesemia 12/13/2016   Hypertensive urgency 12/11/2016   AKI (acute kidney injury) ([place]) 12/11/2016   Fluid overload 12/11/2016   HLD (hyperlipidemia) 06/14/2007   DM (diabetes mellitus), type 2 ([place]) 06/11/2007   Hypertensive heart disease with CHF (congestive heart failure) ([place]) 06/11/2007    Past Surgical History:  Procedure Laterality Date   BASCILIC VEIN TRANSPOSITION Right 10/16/2019   Procedure: Basilic Vein Transposition Right Arm;  Surgeon: Serafina Mitchell, MD;  Location: [facility];  Service: Vascular;  Laterality: Right;   [place] Right 01/29/2020   Procedure: RIGHT ARM SECOND STAGE [place];  Surgeon: Serafina Mitchell, MD;  Location: [place];  Service: Vascular;  Laterality: Right;   BIOPSY  12/22/2018   Procedure: BIOPSY;  Surgeon: Laurence Spates, MD;  Location: [place];  Service: Endoscopy;;   ESOPHAGOGASTRODUODENOSCOPY N/A 12/22/2018   Procedure: ESOPHAGOGASTRODUODENOSCOPY (EGD);  Surgeon: Laurence Spates, MD;  Location: [facility] ENDOSCOPY;  Service: Endoscopy;  Laterality: N/A;   IR FLUORO GUIDE CV LINE RIGHT  07/29/2019   IR US GUIDE VASC ACCESS RIGHT  07/29/2019  Family History  Problem Relation Age of Onset   CAD Mother    Hypertension Mother    Diabetes Neg Hx    Stroke Neg Hx    Cancer Neg Hx    Kidney failure Neg Hx     Social History   Tobacco Use   Smoking status: Never   Smokeless tobacco: Never  Vaping Use   Vaping Use: Never used  Substance Use Topics   Alcohol use: Not Currently   Drug use: Not Currently    Types: Marijuana    Home Medications Prior to Admission  medications   Medication Sig Start Date End Date Taking? Authorizing Provider  calcium acetate (PHOSLO) 667 MG tablet Take 1,334 mg by mouth 3 (three) times daily.  09/12/19   [provider]  gabapentin (NEURONTIN) 100 MG capsule Take 100 mg by mouth daily as needed (hiccups). 08/12/20   [provider]  HYDROcodone-acetaminophen (NORCO) 5-325 MG tablet Take 1 tablet by mouth every 6 (six) hours as needed for moderate pain. 01/29/20   Dagoberto Ligas, PA-C  IRON SUCROSE IV Inject 1 application as directed every Monday, Wednesday, and Friday. Done at dialysis 11/26/19 12/10/20  [provider]  isosorbide mononitrate (IMDUR) 30 MG 24 hr tablet Take 1 tablet (30 mg total) by mouth daily. 12/10/20   Loel Dubonnet, NP  lactulose [facility]) 10 GM/15ML solution SMARTSIG:30 Milliliter(s) By Mouth Daily PRN 11/10/20   [provider]  multivitamin (RENA-VIT) TABS tablet Take 1 tablet by mouth daily. 08/29/19   [provider]  ondansetron (ZOFRAN ODT) 4 MG disintegrating tablet Take 1 tablet (4 mg total) by mouth every 8 (eight) hours as needed for nausea or vomiting. 03/08/20   Deno Etienne, DO  pantoprazole (PROTONIX) 40 MG tablet Take 1 tablet (40 mg total) by mouth daily. 12/10/20   Loel Dubonnet, NP  polyethylene glycol (MIRALAX / GLYCOLAX) 17 g packet Take 17 g by mouth daily as needed. 08/15/20   Davonna Belling, MD    Allergies    Patient has no known allergies.  Review of Systems   Review of Systems  All other systems reviewed and are negative.  Physical Exam Updated Vital Signs BP (!) 154/103 (BP Location: Left Arm)   Pulse (!) 104   Temp 99.2 F (37.3 C) (Oral)   Resp 19   SpO2 100%   Physical Exam Vitals and nursing note reviewed.  Constitutional:      General: He is in acute distress (He is uncomfortable).     Appearance: He is well-developed. He is not ill-appearing.  HENT:     Head: Normocephalic and atraumatic.     Right Ear:  External ear normal.     Left Ear: External ear normal.  Eyes:     Conjunctiva/sclera: Conjunctivae normal.     Pupils: Pupils are equal, round, and reactive to light.  Neck:     Trachea: Phonation normal.  Cardiovascular:     Rate and Rhythm: Normal rate.     Heart sounds: Normal heart sounds.  Pulmonary:     Effort: Pulmonary effort is normal.  Abdominal:     General: There is no distension.     Comments: Persistent hiccups, frequent approximately every 15 to 20 seconds.  Musculoskeletal:        General: No swelling or tenderness. Normal range of motion.     Cervical back: Normal range of motion and neck supple.  Skin:    General: Skin is warm and  dry.  Neurological:     Mental Status: He is alert and oriented to person, place, and time.     Cranial Nerves: No cranial nerve deficit.     Sensory: No sensory deficit.     Motor: No abnormal muscle tone.     Coordination: Coordination normal.  Psychiatric:        Mood and Affect: Mood normal.        Behavior: Behavior normal.        Thought Content: Thought content normal.        Judgment: Judgment normal.    ED Results / Procedures / Treatments   Labs (all labs ordered are listed, but only abnormal results are displayed) Labs Reviewed  RESP PANEL BY RT-PCR (FLU A&B, COVID) ARPGX2  BASIC METABOLIC PANEL  CBC WITH DIFFERENTIAL/PLATELET    EKG None  Radiology No results found.  Procedures Procedures   Medications Ordered in ED Medications  0.9 %  sodium chloride infusion (has no administration in time range)  ondansetron (ZOFRAN) injection 4 mg (has no administration in time range)  LORazepam (ATIVAN) injection 1 mg (has no administration in time range)  fentaNYL (SUBLIMAZE) injection 50 mcg (has no administration in time range)    ED Course  I have reviewed the triage vital signs and the nursing notes.  Pertinent labs & imaging results that were available during my care of the patient were reviewed by me and  considered in my medical decision making (see chart for details).    MDM Rules/Calculators/A&P                           Patient Vitals for the past 24 hrs:  BP Temp Temp src Pulse Resp SpO2  12/10/20 2144 (!) 154/103 99.2 F (37.3 C) Oral (!) 104 19 100 %     Medical Decision Making:  This patient is presenting for evaluation of hiccups and difficulty eating, which does require a range of treatment options, and is a complaint that involves a moderate risk of morbidity and mortality. The differential diagnoses include esophageal spasm, intrathoracic disorder, complications of end-stage renal disease. I decided to review old records, and in summary middle-aged man, with end-stage renal disease, dialysis regularly and has a history of congestive heart failure hypertension, cardiomyopathy and anemia.  I did not require additional historical information from anyone.  Clinical Laboratory Tests Ordered, included CBC, Metabolic panel, and viral panel .  Radiologic Tests Ordered, included chest x-ray.    Critical Interventions-clinical evaluation, laboratory testing, radiography, medication treatment, observation  After These Interventions, the Patient was reevaluated and was found to require evaluation treatment for chronic hiccups.  CRITICAL CARE-no Performed by: Daleen Bo  Nursing Notes Reviewed/ Care Coordinated Applicable Imaging Reviewed Interpretation of Laboratory Data incorporated into ED treatment  Care, transferred to Dr. Florina Ou to disposition after completion of ED evaluation and treatment.   Final Clinical Impression(s) / ED Diagnoses Final diagnoses:  Hiccups    Rx / DC Orders ED Discharge Orders     None        Daleen Bo, MD 12/10/20 2309

## 2020-12-10 NOTE — ED Notes (Signed)
Pt placed on 2L [place] due to O2 desaturation while sleeping

## 2020-12-10 NOTE — Patient Instructions (Addendum)
Medication Instructions:  Your physician has recommended you make the following change in your medication:  START Pantoprazole 40mg  one tablet daily   START Imdur 30mg  one tablet daily  *If you need a refill on your cardiac medications before your next appointment, please call your pharmacy*  Lab Work: None ordered today  If you have labs (blood work) drawn today and your tests are completely normal, you will receive your results only by: [place] (if you have MyChart) OR A paper copy in the mail If you have any lab test that is abnormal or we need to change your treatment, we will call you to review the results.   Testing/Procedures: Your physician has requested that you have an echocardiogram. Echocardiography is a painless test that uses sound waves to create images of your heart. It provides your doctor with information about the size and shape of your heart and how well your heart's chambers and valves are working. This procedure takes approximately one hour. There are no restrictions for this procedure.    You have been referred to gastroenterology for hiccups and belching.  They will call you with an appointment.    Follow-Up: At [facility], you and your health needs are our priority.  As part of our continuing mission to provide you with exceptional heart care, we have created designated Provider Care Teams.  These Care Teams include your primary Cardiologist (physician) and Advanced Practice Providers (APPs -  Physician Assistants and Nurse Practitioners) who all work together to provide you with the care you need, when you need it.  We recommend signing up for the patient portal called "MyChart".  Sign up information is provided on this After Visit Summary.  MyChart is used to connect with patients for Virtual Visits (Telemedicine).  Patients are able to view lab/test results, encounter notes, upcoming appointments, etc.  Non-urgent messages can be sent to your  provider as well.   To learn more about what you can do with MyChart, go to [email or web address].    Your next appointment:   3 month(s)  The format for your next appointment:   In Person  Provider:   You may see Candee Furbish, MD or one of the following Advanced Practice Providers on your designated Care Team:   Kathyrn Drown, NP  Other Instructions

## 2020-12-10 NOTE — ED Triage Notes (Signed)
Patient reports hiccups for 2 weeks. Dialysis patient 3 times weekly. Reports that he is legally blind.

## 2020-12-11 DIAGNOSIS — R066 Hiccough: Secondary | ICD-10-CM | POA: Diagnosis not present

## 2020-12-11 LAB — RESP PANEL BY RT-PCR (FLU A&B, COVID) ARPGX2
Influenza A by PCR: NEGATIVE
Influenza B by PCR: NEGATIVE
SARS Coronavirus 2 by RT PCR: NEGATIVE

## 2020-12-11 MED ORDER — METOCLOPRAMIDE HCL 10 MG PO TABS: 10.0000 mg | ORAL_TABLET | Freq: Every day | ORAL | 0 refills | Status: DC | PRN

## 2020-12-11 NOTE — ED Provider Notes (Signed)
Nursing notes and vitals signs, including pulse oximetry, reviewed.  Summary of this visit's results, reviewed by myself:  EKG:  EKG Interpretation  Date/Time:    Ventricular Rate:    PR Interval:    QRS Duration:   QT Interval:    QTC Calculation:   R Axis:     Text Interpretation:           Labs:  Results for orders placed or performed during the hospital encounter of 12/10/20 (from the past 24 hour(s))  Resp Panel by RT-PCR (Flu A&B, Covid) Nasopharyngeal Swab     Status: None   Collection Time: 12/10/20 11:13 PM   Specimen: Nasopharyngeal Swab; Nasopharyngeal(NP) swabs in vial transport medium  Result Value Ref Range   SARS Coronavirus 2 by RT PCR NEGATIVE NEGATIVE   Influenza A by PCR NEGATIVE NEGATIVE   Influenza B by PCR NEGATIVE NEGATIVE  Basic metabolic panel     Status: Abnormal   Collection Time: 12/10/20 11:13 PM  Result Value Ref Range   Sodium 136 135 - 145 mmol/L   Potassium 3.7 3.5 - 5.1 mmol/L   Chloride 89 (L) 98 - 111 mmol/L   CO2 33 (H) 22 - 32 mmol/L   Glucose, Bld 115 (H) 70 - 99 mg/dL   BUN 27 (H) 6 - 20 mg/dL   Creatinine, Ser 8.26 (H) 0.61 - 1.24 mg/dL   Calcium 9.4 8.9 - 10.3 mg/dL   GFR, Estimated 7 (L) >60 mL/min   Anion gap 14 5 - 15  CBC with Differential     Status: Abnormal   Collection Time: 12/10/20 11:13 PM  Result Value Ref Range   WBC 10.7 (H) 4.0 - 10.5 K/uL   RBC 4.19 (L) 4.22 - 5.81 MIL/uL   Hemoglobin 13.4 13.0 - 17.0 g/dL   HCT 40.0 39.0 - 52.0 %   MCV 95.5 80.0 - 100.0 fL   MCH 32.0 26.0 - 34.0 pg   MCHC 33.5 30.0 - 36.0 g/dL   RDW 14.0 11.5 - 15.5 %   Platelets 221 150 - 400 K/uL   nRBC 0.0 0.0 - 0.2 %   Neutrophils Relative % 72 %   Neutro Abs 7.7 1.7 - 7.7 K/uL   Lymphocytes Relative 17 %   Lymphs Abs 1.8 0.7 - 4.0 K/uL   Monocytes Relative 10 %   Monocytes Absolute 1.0 0.1 - 1.0 K/uL   Eosinophils Relative 1 %   Eosinophils Absolute 0.1 0.0 - 0.5 K/uL   Basophils Relative 0 %   Basophils Absolute 0.0 0.0 -  0.1 K/uL   Immature Granulocytes 0 %   Abs Immature Granulocytes 0.02 0.00 - 0.07 K/uL    Imaging Studies: DG Chest 2 View  Result Date: 12/10/2020 CLINICAL DATA:  Hiccups for 2 weeks. EXAM: CHEST - 2 VIEW COMPARISON:  Radiograph 08/15/2020 FINDINGS: Lung volumes are low. Heart is normal in size. There are small bilateral pleural effusions. No confluent consolidation. No pneumothorax. No acute osseous abnormalities are seen. IMPRESSION: Low lung volumes with small bilateral pleural effusions. Electronically Signed   By: Keith Rake M.D.   On: 12/10/2020 22:55    1:10 AM Patient sleeping peacefully.  His nurse states he has not had hiccups since he received Ativan.     Lekha Dancer, MD 12/11/20 0111

## 2020-12-15 ENCOUNTER — Ambulatory Visit: Payer: Self-pay | Admitting: Podiatry

## 2021-01-10 ENCOUNTER — Other Ambulatory Visit (HOSPITAL_COMMUNITY): Payer: Medicare Other

## 2021-01-10 ENCOUNTER — Encounter (HOSPITAL_COMMUNITY): Payer: Self-pay | Admitting: Cardiology

## 2021-01-10 NOTE — Progress Notes (Unsigned)
Patient ID: [facility], male   DOB: 12/26/1970, 50 y.o.   MRN: [id number]  Verified appointment "no show" status with Me[place]a at 11:47am.

## 2021-01-17 ENCOUNTER — Emergency Department (HOSPITAL_COMMUNITY)
Admission: EM | Admit: 2021-01-17 | Discharge: 2021-01-17 | Disposition: A | Discharge status: Home / Self Care | Payer: Medicare Other | Attending: Emergency Medicine | Admitting: Emergency Medicine

## 2021-01-17 ENCOUNTER — Ambulatory Visit (HOSPITAL_BASED_OUTPATIENT_CLINIC_OR_DEPARTMENT_OTHER)
Admission: RE | Admit: 2021-01-17 | Discharge: 2021-01-17 | Disposition: A | Discharge status: Home / Self Care | Payer: Medicare Other | Source: Ambulatory Visit | Attending: Family | Admitting: Family

## 2021-01-17 ENCOUNTER — Other Ambulatory Visit: Payer: Self-pay

## 2021-01-17 DIAGNOSIS — R45851 Suicidal ideations: Secondary | ICD-10-CM | POA: Insufficient documentation

## 2021-01-17 DIAGNOSIS — Z5321 Procedure and treatment not carried out due to patient leaving prior to being seen by health care provider: Secondary | ICD-10-CM | POA: Insufficient documentation

## 2021-01-17 DIAGNOSIS — R066 Hiccough: Secondary | ICD-10-CM | POA: Insufficient documentation

## 2021-01-17 DIAGNOSIS — I5022 Chronic systolic (congestive) heart failure: Secondary | ICD-10-CM | POA: Insufficient documentation

## 2021-01-17 DIAGNOSIS — I110 Hypertensive heart disease with heart failure: Secondary | ICD-10-CM | POA: Insufficient documentation

## 2021-01-17 DIAGNOSIS — R142 Eructation: Secondary | ICD-10-CM

## 2021-01-17 DIAGNOSIS — E119 Type 2 diabetes mellitus without complications: Secondary | ICD-10-CM | POA: Insufficient documentation

## 2021-01-17 DIAGNOSIS — I10 Essential (primary) hypertension: Secondary | ICD-10-CM

## 2021-01-17 LAB — ECHOCARDIOGRAM COMPLETE
Calc EF: 53.6 %
S' Lateral: 3.8 cm
Single Plane A2C EF: 58.4 %
Single Plane A4C EF: 42.4 %

## 2021-01-17 NOTE — ED Notes (Signed)
Pt called for triage x2 

## 2021-01-18 ENCOUNTER — Encounter: Payer: Self-pay | Admitting: Gastroenterology

## 2021-03-01 ENCOUNTER — Encounter: Payer: Self-pay | Admitting: Gastroenterology

## 2021-03-01 ENCOUNTER — Ambulatory Visit (INDEPENDENT_AMBULATORY_CARE_PROVIDER_SITE_OTHER): Payer: Medicare Other | Admitting: Gastroenterology

## 2021-03-01 VITALS — HR 96 | Ht 72.0 in | Wt 227.1 lb

## 2021-03-01 DIAGNOSIS — Z1211 Encounter for screening for malignant neoplasm of colon: Secondary | ICD-10-CM | POA: Diagnosis not present

## 2021-03-01 DIAGNOSIS — K219 Gastro-esophageal reflux disease without esophagitis: Secondary | ICD-10-CM

## 2021-03-01 DIAGNOSIS — R066 Hiccough: Secondary | ICD-10-CM | POA: Diagnosis not present

## 2021-03-01 DIAGNOSIS — Z1212 Encounter for screening for malignant neoplasm of rectum: Secondary | ICD-10-CM

## 2021-03-01 DIAGNOSIS — R1310 Dysphagia, unspecified: Secondary | ICD-10-CM | POA: Diagnosis not present

## 2021-03-01 MED ORDER — NA SULFATE-K SULFATE-MG SULF 17.5-3.13-1.6 GM/177ML PO SOLN: 1.0000 | Freq: Once | ORAL | 0 refills | Status: AC

## 2021-03-01 MED ORDER — CHLORPROMAZINE HCL 25 MG PO TABS: 25.0000 mg | ORAL_TABLET | Freq: Three times a day (TID) | ORAL | 0 refills | Status: DC

## 2021-03-01 NOTE — Progress Notes (Signed)
HPI : [facility] is a very pleasant 50 year old male with a history of end-stage renal disease, diabetes, CHF and blindness who is referred to [place] by Laurann Montana, NP for refractory hiccups.  Patient states that his hiccups started in February 2021.  The hiccups started during a 9-day hospitalization for COVID.  The patient reports a cyclic pattern to his hiccups wherein he will have persistent hiccups for 10 days, then they will resolve for 7 days, then they will come back for 10 days.  When he has the hiccups, he says they are constant throughout the day and they interfere with his sleep.  He developed significant chest pain, which is relieved with vomiting, so he frequently vomits multiple times a day while he has the hiccups.  When he is vomiting frequently during these episodes, he has a sore throat and he develops pain with swallowing.  He frequently has sensation of food sitting in his esophagus  In addition to the symptoms, he has daily heartburn for which she takes Tums throughout the day.  He was also started on Protonix a few weeks ago which he takes every day which she says does help with the heartburn.  He denies abdominal pain and, and denies nausea or vomiting when he is not having hiccups.  His weight has been stable. He has chronic constipation, manifested as bowel movement about every 2 days.  This has been his norm for many years.  He denies blood in the stool.  He underwent an upper endoscopy in June 2020 to evaluate anemia while in the hospital.  The EGD showed a small antral ulcer and duodenal polyps but was otherwise unremarkable.   Past Medical History:  Diagnosis Date   Anemia 2021   Asthma    as a child   Blind    CHF (congestive heart failure) ([place]) 2021   Diabetes mellitus without complication ([place])    type 2   Hypertension    Legally blind    B/L   Pneumonia 2021   Renal disorder    dialysis T-TH-Sat     Past Surgical History:  Procedure Laterality  Date   BASCILIC VEIN TRANSPOSITION Right 10/16/2019   Procedure: Basilic Vein Transposition Right Arm;  Surgeon: Serafina Mitchell, MD;  Location: [place];  Service: Vascular;  Laterality: Right;   [place] Right 01/29/2020   Procedure: RIGHT ARM SECOND STAGE [place];  Surgeon: Serafina Mitchell, MD;  Location: [place];  Service: Vascular;  Laterality: Right;   BIOPSY  12/22/2018   Procedure: BIOPSY;  Surgeon: Laurence Spates, MD;  Location: [facility] ENDOSCOPY;  Service: Endoscopy;;   ESOPHAGOGASTRODUODENOSCOPY N/A 12/22/2018   Procedure: ESOPHAGOGASTRODUODENOSCOPY (EGD);  Surgeon: Laurence Spates, MD;  Location[facility]COPY;  Service: Endoscopy;  Laterality: N/A;   IR FLUORO GUIDE CV LINE RIGHT  07/29/2019   IR US GUIDE VASC ACCESS RIGHT  07/29/2019   Family History  Problem Relation Age of Onset   CAD Mother    Hypertension Mother    Diabetes Neg Hx    Stroke Neg Hx    Cancer Neg Hx    Kidney failure Neg Hx    Stomach cancer Neg Hx    Colon cancer Neg Hx    Social History   Tobacco Use   Smoking status: Never   Smokeless tobacco: Never  Vaping Use   Vaping Use: Never used  Substance Use Topics   Alcohol use: Not Currently   Drug use: Not  Currently    Types: Marijuana   Current Outpatient Medications  Medication Sig Dispense Refill   calcium acetate (PHOSLO) 667 MG tablet Take 1,334 mg by mouth 3 (three) times daily.      doxycycline (VIBRAMYCIN) 100 MG capsule Take 100 mg by mouth 2 (two) times daily.     gabapentin (NEURONTIN) 100 MG capsule Take 100 mg by mouth daily as needed (hiccups).     isosorbide mononitrate (IMDUR) 30 MG 24 hr tablet Take 1 tablet (30 mg total) by mouth daily. 90 tablet 2   lactulose (CHRONULAC) 10 GM/15ML solution SMARTSIG:30 Milliliter(s) By Mouth Daily PRN     multivitamin (RENA-VIT) TABS tablet Take 1 tablet by mouth daily.     ondansetron (ZOFRAN ODT) 4 MG disintegrating tablet Take 1 tablet (4 mg total) by mouth every 8  (eight) hours as needed for nausea or vomiting. 20 tablet 0   pantoprazole (PROTONIX) 40 MG tablet Take 1 tablet (40 mg total) by mouth daily. 90 tablet 2   IRON SUCROSE IV Inject 1 application as directed every Monday, Wednesday, and Friday. Done at dialysis     No current facility-administered medications for this visit.   No Known Allergies   Review of Systems: All systems reviewed and negative except where noted in HPI.    No results found.  Physical Exam: Pulse 96   Ht 6' (1.829 m)   Wt 227 lb 2 oz (103 kg)   SpO2 98%   BMI 30.80 kg/m  Constitutional: Pleasant,well-developed, African-American male in no acute distress. HEENT: Normocephalic and atraumatic. No scleral icterus. Cardiovascular: Normal rate, regular rhythm.  Pulmonary/chest: Effort normal and breath sounds normal. No wheezing, rales or rhonchi. Abdominal: Soft, nondistended, nontender. Bowel sounds active throughout. There are no masses palpable. No hepatomegaly. Extremities: no edema, right AV fistula with palpable thrill Lymphadenopathy: No cervical adenopathy noted. Neurological: Alert and oriented to person place and time. Skin: Skin is warm and dry. No rashes noted. Psychiatric: Normal mood and affect. Behavior is normal.  CBC    Component Value Date/Time   WBC 10.7 (H) 12/10/2020 2313   RBC 4.19 (L) 12/10/2020 2313   HGB 13.4 12/10/2020 2313   HGB 9.5 (L) 01/16/2019 1517   HCT 40.0 12/10/2020 2313   HCT 29.1 (L) 01/16/2019 1517   PLT 221 12/10/2020 2313   PLT 189 01/16/2019 1517   MCV 95.5 12/10/2020 2313   MCV 92 01/16/2019 1517   MCH 32.0 12/10/2020 2313   MCHC 33.5 12/10/2020 2313   RDW 14.0 12/10/2020 2313   RDW 14.9 01/16/2019 1517   LYMPHSABS 1.8 12/10/2020 2313   LYMPHSABS 1.0 01/16/2019 1517   MONOABS 1.0 12/10/2020 2313   EOSABS 0.1 12/10/2020 2313   EOSABS 0.2 01/16/2019 1517   BASOSABS 0.0 12/10/2020 2313   BASOSABS 0.0 01/16/2019 1517    CMP     Component Value Date/Time    NA 136 12/10/2020 2313   NA 141 01/16/2019 1517   K 3.7 12/10/2020 2313   CL 89 (L) 12/10/2020 2313   CO2 33 (H) 12/10/2020 2313   GLUCOSE 115 (H) 12/10/2020 2313   BUN 27 (H) 12/10/2020 2313   BUN 62 (H) 01/16/2019 1517   CREATININE 8.26 (H) 12/10/2020 2313   CALCIUM 9.4 12/10/2020 2313   PROT 8.6 (H) 03/08/2020 0709   ALBUMIN 3.5 03/08/2020 0709   AST 13 (L) 03/08/2020 0709   ALT 12 03/08/2020 0709   ALKPHOS 50 03/08/2020 0709   BILITOT 0.7 03/08/2020 0709  GFRNONAA 7 (L) 12/10/2020 2313   GFRAA 3 (L) 03/08/2020 0709     ASSESSMENT AND PLAN: 50 year old male with end-stage renal disease with 1-1/2 years of recurrent refractory hiccups associated with vomiting, dysphagia and odynophagia.  The cyclic nature of the symptoms (lasting for 10 days, resolving for 7 days) makes an organic etiology of his hiccups unlikely, but given the persistent nature of his symptoms further evaluation is needed.  We will assess for anatomic lesions with CT of the chest and upper endoscopy.  In the meantime, we will try Thorazine 25 mg 3 times a day. The patient is also overdue for his initial average rescreening colonoscopy.  Refractory hiccups/dysphagia/vomiting - EGD - CT chest without contrast - Thorazine 25 mg p.o. 3 times daily; recheck CBC after 1 week to ensure stable white blood cell count  GERD - Continue Protonix once daily  Colon cancer screening -  Screening colonoscopy  The details, risks (including bleeding, perforation, infection, missed lesions, medication reactions and possible hospitalization or surgery if complications occur), benefits, and alternatives to EGD/colonoscopy with possible biopsy, dilation and polypectomy were discussed with the patient and he/she consents to proceed.    Emersynn Deatley E. Candis Schatz, MD [facility]  Loel Dubonnet, NP

## 2021-03-01 NOTE — Patient Instructions (Signed)
If you are age 50 or older, your body mass index should be between 23-30. Your Body mass index is 30.8 kg/m. If this is out of the aforementioned range listed, please consider follow up with your Primary Care Provider.  If you are age 75 or younger, your body mass index should be between 19-25. Your Body mass index is 30.8 kg/m. If this is out of the aformentioned range listed, please consider follow up with your Primary Care Provider.   You have been scheduled for an endoscopy and colonoscopy. Please follow the written instructions given to you at your visit today. Please pick up your prep supplies at the pharmacy within the next 1-3 days. If you use inhalers (even only as needed), please bring them with you on the day of your procedure.   You have been scheduled for a CT scan of the abdomen and pelvis at [facility], 1st floor Radiology. You are scheduled on 03/07/21  at 2:30pm. You should arrive 15 minutes prior to your appointment time for registration.     Plan on being at [facility] for 45 minutes or longer, depending on the type of exam you are having performed.   If you have any questions regarding your exam or if you need to reschedule, you may call [facility] at [phone] between the hours of 8:00 am and 5:00 pm, Monday-Friday.   The  GI providers would like to encourage you to use [facility] to communicate with providers for non-urgent requests or questions.  Due to long hold times on the telephone, sending your provider a message by [facility] may be a faster and more efficient way to get a response.  Please allow 48 business hours for a response.  Please remember that this is for non-urgent requests.   It was a pleasure to see you today!  Thank you for trusting me with your gastrointestinal care!    Scott E. Candis Schatz, MD

## 2021-03-02 ENCOUNTER — Encounter: Payer: Self-pay | Admitting: Gastroenterology

## 2021-03-07 ENCOUNTER — Ambulatory Visit (HOSPITAL_COMMUNITY)
Admission: RE | Admit: 2021-03-07 | Discharge: 2021-03-07 | Disposition: A | Discharge status: Home / Self Care | Payer: Medicare Other | Source: Ambulatory Visit | Attending: Gastroenterology | Admitting: Gastroenterology

## 2021-03-07 ENCOUNTER — Other Ambulatory Visit: Payer: Self-pay

## 2021-03-07 DIAGNOSIS — R066 Hiccough: Secondary | ICD-10-CM | POA: Insufficient documentation

## 2021-03-07 DIAGNOSIS — R1310 Dysphagia, unspecified: Secondary | ICD-10-CM | POA: Diagnosis present

## 2021-03-07 DIAGNOSIS — Z1212 Encounter for screening for malignant neoplasm of rectum: Secondary | ICD-10-CM | POA: Diagnosis present

## 2021-03-07 DIAGNOSIS — Z1211 Encounter for screening for malignant neoplasm of colon: Secondary | ICD-10-CM | POA: Insufficient documentation

## 2021-03-09 ENCOUNTER — Other Ambulatory Visit: Payer: Self-pay

## 2021-03-09 ENCOUNTER — Telehealth: Payer: Self-pay

## 2021-03-09 DIAGNOSIS — R1310 Dysphagia, unspecified: Secondary | ICD-10-CM

## 2021-03-09 DIAGNOSIS — R918 Other nonspecific abnormal finding of lung field: Secondary | ICD-10-CM

## 2021-03-09 NOTE — Telephone Encounter (Signed)
[facility] recommended an echo that was performed on 01/17/21. He does not need an additional echo from our standpoint.

## 2021-03-09 NOTE — Telephone Encounter (Signed)
[facility] Pre-operative Risk Assessment     Corey Park [birth year] [id number]  Procedure: EGD and Colonoscopy Anesthesia type:  MAC Procedure Date: 03/22/21 Provider: Dr. Candis Schatz  Type of Clearance needed: REPEAT ECHO REQUIRED  Medication(s) needing held: N/A   Length of time for medication to be held: N/A  Please review request and advise by either responding to this message or by sending your response to the fax # provided below.  Thank you,  [facility]  Phone: [phone] Fax: [phone] ATTENTION: Sharian Delia, LPN

## 2021-03-10 NOTE — Telephone Encounter (Signed)
   Name: Corey Park  DOB: October 09, 1970  MRN: [id number]   Primary Cardiologist: Candee Furbish, MD  Chart reviewed as part of pre-operative protocol coverage.  Our office was contacted for cardiac preoperative review prior to EGD and colonoscopy 03/22/2021.  When last seen in clinic 12/10/20, echo was ordered for reassessment of EF and structure.  Given this outstanding echo and as patient was a no-show to his 01/10/2021 appointment, his primary cardiologist was contacted for further input and has deemed this patient an acceptable risk to proceed with his scheduled EGD and colonoscopy 03/22/2021 prior to obtaining previously recommended echo or without further cardiovascular testing.   I will route this recommendation to the requesting party via Epic fax function and remove from pre-op pool. Please call with questions.  Arvil Chaco, PA-C 03/10/2021, 4:17 PM

## 2021-03-10 NOTE — Telephone Encounter (Signed)
Routing to Dr. Candis Schatz as Juluis Rainier

## 2021-03-14 NOTE — Progress Notes (Signed)
Synopsis: Referred for abnormal CT Chest by Daryel November, MD  Subjective:   PATIENT ID: Corey Park GENDER: male DOB: July 01, 1970, MRN: [id number]  Chief Complaint  Patient presents with   Consult    Referred by Dr. Candis Schatz for abnormal CT scan last week and hiccups for the past 14 months.    50yM with history of ESRD, CHF, DM, blindness who is referred from GI due to abnormal findings on CT Chest which was obtained to work up refractory hiccups.  Hospitalized with covid-19 07/2019.  20 months of hiccups. He has had solid food dysphagia since maybe May. He has force himself to vomit fairly frequently due to his dysphagia. He doesn't clearly feel like he has issues with aspiration/choking on gastric secretions during or in between these episodes. He has a cough only every now and then but not often. He has trouble breathing sometimes at night when he lies down on his back but not when he lies down on his side. He has had no fever, night sweats drenching sheets, unintentional weight loss, CP.  Otherwise pertinent review of systems is negative.  No family history of lung disease.  He has worked in [employer] in [employer] until he was blind. He has no pets at home. No hot tub at home. No vaping. Smoked 3 years not quite ppd - in 2004.   Past Medical History:  Diagnosis Date   Anemia 2021   Asthma    as a child   Blind    CHF (congestive heart failure) ([place]) 2021   Diabetes mellitus without complication ([place])    type 2   Hypertension    Legally blind    B/L   Pneumonia 2021   Renal disorder    dialysis T-TH-Sat     Family History  Problem Relation Age of Onset   CAD Mother    Hypertension Mother    Diabetes Neg Hx    Stroke Neg Hx    Cancer Neg Hx    Kidney failure Neg Hx    Stomach cancer Neg Hx    Colon cancer Neg Hx      Past Surgical History:  Procedure Laterality Date   BASCILIC VEIN TRANSPOSITION Right 10/16/2019   Procedure:  Basilic Vein Transposition Right Arm;  Surgeon: Serafina Mitchell, MD;  Location: [place];  Service: Vascular;  Laterality: Right;   [place] Right 01/29/2020   Procedure: RIGHT ARM SECOND STAGE [place];  Surgeon: Serafina Mitchell, MD;  Location: [place];  Service: Vascular;  Laterality: Right;   BIOPSY  12/22/2018   Procedure: BIOPSY;  Surgeon: Laurence Spates, MD;  Location: [facility] ENDOSCOPY;  Service: Endoscopy;;   ESOPHAGOGASTRODUODENOSCOPY N/A 12/22/2018   Procedure: ESOPHAGOGASTRODUODENOSCOPY (EGD);  Surgeon: Laurence Spates, MD;  Location: [facility] ENDOSCOPY;  Service: Endoscopy;  Laterality: N/A;   IR FLUORO GUIDE CV LINE RIGHT  07/29/2019   IR US GUIDE VASC ACCESS RIGHT  07/29/2019    Social History   Socioeconomic History   Marital status: Single    Spouse name: Not on file   Number of children: Not on file   Years of education: Not on file   Highest education level: Not on file  Occupational History   Not on file  Tobacco Use   Smoking status: Never   Smokeless tobacco: Never  Vaping Use   Vaping Use: Never used  Substance and Sexual Activity   Alcohol use: Not Currently   Drug use:  Not Currently    Types: Marijuana   Sexual activity: Not on file  Other Topics Concern   Not on file  Social History Narrative   Not on file   Social Determinants of Health   Financial Resource Strain: Not on file  Food Insecurity: Not on file  Transportation Needs: Not on file  Physical Activity: Not on file  Stress: Not on file  Social Connections: Not on file  Intimate Partner Violence: Not on file     No Known Allergies   Outpatient Medications Prior to Visit  Medication Sig Dispense Refill   calcium acetate (PHOSLO) 667 MG tablet Take 1,334 mg by mouth 3 (three) times daily.      chlorproMAZINE (THORAZINE) 25 MG tablet Take 1 tablet (25 mg total) by mouth 3 (three) times daily. 90 tablet 0   gabapentin (NEURONTIN) 100 MG capsule Take 100 mg by mouth daily  as needed (hiccups).     isosorbide mononitrate (IMDUR) 30 MG 24 hr tablet Take 1 tablet (30 mg total) by mouth daily. 90 tablet 2   multivitamin (RENA-VIT) TABS tablet Take 1 tablet by mouth daily.     ondansetron (ZOFRAN ODT) 4 MG disintegrating tablet Take 1 tablet (4 mg total) by mouth every 8 (eight) hours as needed for nausea or vomiting. 20 tablet 0   pantoprazole (PROTONIX) 40 MG tablet Take 1 tablet (40 mg total) by mouth daily. 90 tablet 2   IRON SUCROSE IV Inject 1 application as directed every Monday, Wednesday, and Friday. Done at dialysis     doxycycline (VIBRAMYCIN) 100 MG capsule Take 100 mg by mouth 2 (two) times daily.     lactulose (CHRONULAC) 10 GM/15ML solution SMARTSIG:30 Milliliter(s) By Mouth Daily PRN     No facility-administered medications prior to visit.       Objective:   Physical Exam:  General appearance: 50 y.o., male, NAD, conversant  Eyes: anicteric sclerae, moist conjunctivae; no lid-lag; PERRL, tracking appropriately HENT: NCAT; oropharynx, MMM, no mucosal ulcerations; normal hard and soft palate Neck: Trachea midline; no lymphadenopathy, no JVD Lungs: CTAB, no crackles, no wheeze, with normal respiratory effort CV: RRR, no MRGs  Abdomen: Soft, non-tender; non-distended, BS present  Extremities: No peripheral edema, radial and DP pulses present bilaterally  Skin: Normal temperature, turgor and texture; no rash Psych: Appropriate affect Neuro: Alert and oriented to person and place, no focal deficit    Vitals:   03/15/21 1353  BP: 136/80  Pulse: (!) 102  SpO2: 100%  Weight: 234 lb 9.6 oz (106.4 kg)  Height: 6' (1.829 m)   100% on RA BMI Readings from Last 3 Encounters:  03/15/21 31.82 kg/m  03/01/21 30.80 kg/m  12/10/20 29.95 kg/m   Wt Readings from Last 3 Encounters:  03/15/21 234 lb 9.6 oz (106.4 kg)  03/01/21 227 lb 2 oz (103 kg)  12/10/20 227 lb (103 kg)     CBC    Component Value Date/Time   WBC 10.7 (H) 12/10/2020  2313   RBC 4.19 (L) 12/10/2020 2313   HGB 13.4 12/10/2020 2313   HGB 9.5 (L) 01/16/2019 1517   HCT 40.0 12/10/2020 2313   HCT 29.1 (L) 01/16/2019 1517   PLT 221 12/10/2020 2313   PLT 189 01/16/2019 1517   MCV 95.5 12/10/2020 2313   MCV 92 01/16/2019 1517   MCH 32.0 12/10/2020 2313   MCHC 33.5 12/10/2020 2313   RDW 14.0 12/10/2020 2313   RDW 14.9 01/16/2019 1517   LYMPHSABS 1.8  12/10/2020 2313   LYMPHSABS 1.0 01/16/2019 1517   MONOABS 1.0 12/10/2020 2313   EOSABS 0.1 12/10/2020 2313   EOSABS 0.2 01/16/2019 1517   BASOSABS 0.0 12/10/2020 2313   BASOSABS 0.0 01/16/2019 1517    No eos historically  Chest Imaging: CT Chest 03/07/21 reviewed by me and remarkable for widespread ggo/consolidative nodules RUL>elsewhere, mediastinal LAD at 4R, 7S.  Pulmonary Functions Testing Results: No flowsheet data found.   Echocardiogram:   TTE 7/25 with EF 40-45%, ind filling      Assessment & Plan:   # Multifocal ggo/consolidative pulmonary nodules:  # Mediastinal lymphadenopathy: My leading suspicion is that this is lipoid pneumonia from repeated microaspiration, he typically vomits leaning over on his right side, radiographic abnormalities are a bit worse on that side. Other considerations include indolent infection like NTM but he really doesn't have much of a cough and it's not productive, other atypical pneumonia from virus, etc, organizing pneumonia but this is an unusual pattern for it.  Plan: - unable to expectorate sputum specimen - check HIV - check PFTs - has EGD coming up for solid food dysphagia. If this reveals cause of dysphagia and if it looks treatable, then I'll be curious to see what repeat imaging shows in a few months if he's no longer having to vomit so much and is hopefully dealing with less microaspiration. If there is radiographic progression, symptomatic, and/or lung function decline at that point could consider invasive workup with [place], MD [facility] 03/15/2021 2:35 PM

## 2021-03-15 ENCOUNTER — Telehealth: Payer: Self-pay | Admitting: Student

## 2021-03-15 ENCOUNTER — Ambulatory Visit (INDEPENDENT_AMBULATORY_CARE_PROVIDER_SITE_OTHER): Payer: Medicare Other | Admitting: Student

## 2021-03-15 ENCOUNTER — Encounter: Payer: Self-pay | Admitting: Student

## 2021-03-15 ENCOUNTER — Other Ambulatory Visit: Payer: Self-pay

## 2021-03-15 VITALS — BP 136/80 | HR 102 | Ht 72.0 in | Wt 234.6 lb

## 2021-03-15 DIAGNOSIS — J189 Pneumonia, unspecified organism: Secondary | ICD-10-CM | POA: Diagnosis not present

## 2021-03-15 DIAGNOSIS — Z114 Encounter for screening for human immunodeficiency virus [HIV]: Secondary | ICD-10-CM | POA: Diagnosis not present

## 2021-03-15 LAB — CBC WITH DIFFERENTIAL/PLATELET
Basophils Absolute: 0 10*3/uL (ref 0.0–0.1)
Basophils Relative: 0.6 % (ref 0.0–3.0)
Eosinophils Absolute: 0.4 10*3/uL (ref 0.0–0.7)
Eosinophils Relative: 4.4 % (ref 0.0–5.0)
HCT: 26.4 % — ABNORMAL LOW (ref 39.0–52.0)
Hemoglobin: 8.9 g/dL — ABNORMAL LOW (ref 13.0–17.0)
Lymphocytes Relative: 14.7 % (ref 12.0–46.0)
Lymphs Abs: 1.2 10*3/uL (ref 0.7–4.0)
MCHC: 33.8 g/dL (ref 30.0–36.0)
MCV: 96.1 fl (ref 78.0–100.0)
Monocytes Absolute: 0.8 10*3/uL (ref 0.1–1.0)
Monocytes Relative: 10.2 % (ref 3.0–12.0)
Neutro Abs: 5.8 10*3/uL (ref 1.4–7.7)
Neutrophils Relative %: 70.1 % (ref 43.0–77.0)
Platelets: 318 10*3/uL (ref 150.0–400.0)
RBC: 2.75 Mil/uL — ABNORMAL LOW (ref 4.22–5.81)
RDW: 15.8 % — ABNORMAL HIGH (ref 11.5–15.5)
WBC: 8.2 10*3/uL (ref 4.0–10.5)

## 2021-03-15 NOTE — Telephone Encounter (Signed)
ATC with cbc result. Will send mychart message even though he may need some assistance reading it. Will recommend that he get repeat cbc at dialysis or on Tuesday when he presents for EGD. I'll route this as well to Dr. Candis Schatz to see if they can order cbc there. With his blindness, since it's hard for him to tell potentially if he's having any clinical bleeding, I'd recommend that he go to the ED for any lightheadedness.

## 2021-03-15 NOTE — Patient Instructions (Addendum)
-   breathing tests - labs today - we will repeat ct chest in 3 months

## 2021-03-15 NOTE — Addendum Note (Signed)
Addended by: Suzzanne Cloud E on: 03/15/2021 02:47 PM   Modules accepted: Orders

## 2021-03-16 ENCOUNTER — Other Ambulatory Visit: Payer: Self-pay

## 2021-03-16 DIAGNOSIS — D649 Anemia, unspecified: Secondary | ICD-10-CM

## 2021-03-16 LAB — HIV ANTIBODY (ROUTINE TESTING W REFLEX): HIV 1&2 Ab, 4th Generation: NONREACTIVE

## 2021-03-22 ENCOUNTER — Other Ambulatory Visit: Payer: Self-pay

## 2021-03-22 ENCOUNTER — Encounter: Payer: Self-pay | Admitting: Gastroenterology

## 2021-03-22 ENCOUNTER — Other Ambulatory Visit (INDEPENDENT_AMBULATORY_CARE_PROVIDER_SITE_OTHER): Payer: Medicare Other

## 2021-03-22 ENCOUNTER — Ambulatory Visit (AMBULATORY_SURGERY_CENTER): Payer: Medicare Other | Admitting: Gastroenterology

## 2021-03-22 VITALS — BP 144/87 | HR 95 | Temp 97.5°F | Resp 41 | Ht 72.0 in | Wt 227.0 lb

## 2021-03-22 DIAGNOSIS — K2990 Gastroduodenitis, unspecified, without bleeding: Secondary | ICD-10-CM | POA: Diagnosis not present

## 2021-03-22 DIAGNOSIS — R1310 Dysphagia, unspecified: Secondary | ICD-10-CM | POA: Diagnosis not present

## 2021-03-22 DIAGNOSIS — K2970 Gastritis, unspecified, without bleeding: Secondary | ICD-10-CM

## 2021-03-22 DIAGNOSIS — K2980 Duodenitis without bleeding: Secondary | ICD-10-CM | POA: Diagnosis not present

## 2021-03-22 DIAGNOSIS — K219 Gastro-esophageal reflux disease without esophagitis: Secondary | ICD-10-CM

## 2021-03-22 DIAGNOSIS — Z1211 Encounter for screening for malignant neoplasm of colon: Secondary | ICD-10-CM | POA: Diagnosis not present

## 2021-03-22 DIAGNOSIS — D649 Anemia, unspecified: Secondary | ICD-10-CM

## 2021-03-22 DIAGNOSIS — K2950 Unspecified chronic gastritis without bleeding: Secondary | ICD-10-CM | POA: Diagnosis not present

## 2021-03-22 DIAGNOSIS — Z1212 Encounter for screening for malignant neoplasm of rectum: Secondary | ICD-10-CM

## 2021-03-22 LAB — CBC WITH DIFFERENTIAL/PLATELET
Basophils Absolute: 0.1 10*3/uL (ref 0.0–0.1)
Basophils Relative: 0.6 % (ref 0.0–3.0)
Eosinophils Absolute: 0.3 10*3/uL (ref 0.0–0.7)
Eosinophils Relative: 3 % (ref 0.0–5.0)
HCT: 24.8 % — ABNORMAL LOW (ref 39.0–52.0)
Hemoglobin: 8.4 g/dL — ABNORMAL LOW (ref 13.0–17.0)
Lymphocytes Relative: 11.1 % — ABNORMAL LOW (ref 12.0–46.0)
Lymphs Abs: 0.9 10*3/uL (ref 0.7–4.0)
MCHC: 33.7 g/dL (ref 30.0–36.0)
MCV: 96.9 fl (ref 78.0–100.0)
Monocytes Absolute: 0.6 10*3/uL (ref 0.1–1.0)
Monocytes Relative: 7.2 % (ref 3.0–12.0)
Neutro Abs: 6.6 10*3/uL (ref 1.4–7.7)
Neutrophils Relative %: 78.1 % — ABNORMAL HIGH (ref 43.0–77.0)
Platelets: 264 10*3/uL (ref 150.0–400.0)
RBC: 2.57 Mil/uL — ABNORMAL LOW (ref 4.22–5.81)
RDW: 16.4 % — ABNORMAL HIGH (ref 11.5–15.5)
WBC: 8.5 10*3/uL (ref 4.0–10.5)

## 2021-03-22 MED ORDER — SODIUM CHLORIDE 0.9 % IV SOLN: 500.0000 mL | Freq: Once | INTRAVENOUS | Status: DC

## 2021-03-22 NOTE — Op Note (Signed)
[facility] Patient Name: Corey Park Procedure Date: 03/22/2021 2:22 PM MRN: [id number] Endoscopist: Nicki Reaper E. Candis Schatz , MD Age: 50 Referring MD:  Date of Birth: 06/09/1971 Gender: Male Account #: [id number] Procedure:                Upper GI endoscopy Indications:              Dysphagia, Unexplained chest pain Medicines:                Monitored Anesthesia Care Procedure:                Pre-Anesthesia Assessment:                           - Prior to the procedure, a History and Physical                            was performed, and patient medications and                            allergies were reviewed. The patient's tolerance of                            previous anesthesia was also reviewed. The risks                            and benefits of the procedure and the sedation                            options and risks were discussed with the patient.                            All questions were answered, and informed consent                            was obtained. Prior Anticoagulants: The patient has                            taken no previous anticoagulant or antiplatelet                            agents. ASA Grade Assessment: III - A patient with                            severe systemic disease. After reviewing the risks                            and benefits, the patient was deemed in                            satisfactory condition to undergo the procedure.                           After obtaining informed consent, the endoscope was  passed under direct vision. Throughout the                            procedure, the patient's blood pressure, pulse, and                            oxygen saturations were monitored continuously. The                            Endoscope was introduced through the mouth, and                            advanced to the third part of duodenum. The upper                            GI endoscopy  was accomplished without difficulty.                            The patient tolerated the procedure well. Scope In: Scope Out: Findings:                 Thick, frothy fluid was found in the entire                            esophagus.                           Diffuse mild mucosal changes characterized by                            grayish discoloration were found in the lower third                            of the esophagus. Biopsies were taken with a cold                            forceps for histology. Estimated blood loss was                            minimal.                           The exam of the esophagus was otherwise normal.                           Diffuse moderate inflammation characterized by                            erythema and friability was found in the gastric                            body. Biopsies were taken with a cold forceps for                            histology. Estimated blood loss was  minimal.                           The exam of the stomach was otherwise normal.                           A single 10 mm nodule was found in the second                            portion of the duodenum. Biopsies were taken with a                            cold forceps for histology. There appeared to be                            some clear fluid released with the biopsies.                            Estimated blood loss was minimal.                           A few 3 to 10 mm nodules with a diffuse                            distribution were found in the duodenal bulb.                            Biopsies were taken with a cold forceps for                            histology. Estimated blood loss was minimal.                           The exam of the duodenum was otherwise normal. Complications:            No immediate complications. Estimated Blood Loss:     Estimated blood loss was minimal. Impression:               - Fluid in the esophagus.                            - Discolored mucosa in the esophagus. Biopsied.                           - Gastritis. Biopsied.                           - Nodule found in the duodenum, questionable cystic                            lesion. Biopsied.                           - Nodule found in the duodenum. Biopsied.                           -  No abnormalities found to explain dysphagia.                            Esophageal findings of copious frothy sputum may be                            suggestive of an underlying esophageal motility                            disorder. Recommendation:           - Patient has a contact number available for                            emergencies. The signs and symptoms of potential                            delayed complications were discussed with the                            patient. Return to normal activities tomorrow.                            Written discharge instructions were provided to the                            patient.                           - Resume previous diet.                           - Continue present medications.                           - Await pathology results.                           - Follow up with Dr. Candis Schatz in GI clinic in 2-4                            weeks to discuss biopsy results and further work up.                           - Continue thorazine for now.                           - Check CBC today. Temesha Queener E. Candis Schatz, MD 03/22/2021 3:09:59 PM This report has been signed electronically.

## 2021-03-22 NOTE — Progress Notes (Signed)
STAT CBC already entered by Dr. Candis Schatz.

## 2021-03-22 NOTE — Progress Notes (Signed)
VS-DT 

## 2021-03-22 NOTE — Patient Instructions (Signed)
Resume previous diet and continue current medications. Awaiting pathology results. Follow up with Dr. Candis Schatz in GI clinic in 2-4 weeks to discuss biopsy results and further work up. Continue Thorazine for now. Check CBC today. Repeat Colonoscopy in 10 years for screening purposes  YOU HAD AN ENDOSCOPIC PROCEDURE TODAY AT [place]:   Refer to the procedure report that was given to you for any specific questions about what was found during the examination.  If the procedure report does not answer your questions, please call your gastroenterologist to clarify.  If you requested that your care partner not be given the details of your procedure findings, then the procedure report has been included in a sealed envelope for you to review at your convenience later.  YOU SHOULD EXPECT: Some feelings of bloating in the abdomen. Passage of more gas than usual.  Walking can help get rid of the air that was put into your GI tract during the procedure and reduce the bloating. If you had a lower endoscopy (such as a colonoscopy or flexible sigmoidoscopy) you may notice spotting of blood in your stool or on the toilet paper. If you underwent a bowel prep for your procedure, you may not have a normal bowel movement for a few days.  Please Note:  You might notice some irritation and congestion in your nose or some drainage.  This is from the oxygen used during your procedure.  There is no need for concern and it should clear up in a day or so.  SYMPTOMS TO REPORT IMMEDIATELY:  Following lower endoscopy (colonoscopy or flexible sigmoidoscopy):  Excessive amounts of blood in the stool  Significant tenderness or worsening of abdominal pains  Swelling of the abdomen that is new, acute  Fever of 100F or higher  Following upper endoscopy (EGD)  Vomiting of blood or coffee ground material  New chest pain or pain under the shoulder blades  Painful or persistently difficult swallowing  New  shortness of breath  Fever of 100F or higher  Black, tarry-looking stools  For urgent or emergent issues, a gastroenterologist can be reached at any hour by calling [phone]. Do not use MyChart messaging for urgent concerns.    DIET:  We do recommend a small meal at first, but then you may proceed to your regular diet.  Drink plenty of fluids but you should avoid alcoholic beverages for 24 hours.  ACTIVITY:  You should plan to take it easy for the rest of today and you should NOT DRIVE or use heavy machinery until tomorrow (because of the sedation medicines used during the test).    FOLLOW UP: Our staff will call the number listed on your records 48-72 hours following your procedure to check on you and address any questions or concerns that you may have regarding the information given to you following your procedure. If we do not reach you, we will leave a message.  We will attempt to reach you two times.  During this call, we will ask if you have developed any symptoms of COVID 19. If you develop any symptoms (ie: fever, flu-like symptoms, shortness of breath, cough etc.) before then, please call [phone].  If you test positive for Covid 19 in the 2 weeks post procedure, please call and report this information to [place].    If any biopsies were taken you will be contacted by phone or by letter within the next 1-3 weeks.  Please call us at [phone] if you  have not heard about the biopsies in 3 weeks.    SIGNATURES/CONFIDENTIALITY: You and/or your care partner have signed paperwork which will be entered into your electronic medical record.  These signatures attest to the fact that that the information above on your After Visit Summary has been reviewed and is understood.  Full responsibility of the confidentiality of this discharge information lies with you and/or your care-partner.

## 2021-03-22 NOTE — Progress Notes (Signed)
History and Physical Interval Note:  Patient was noted to have a drop in his hgb at his pulmonology visit, and was recommended to repeat CBC prior to his procedure but this was not completed.  Pt has been feeling well and denies symptoms of dizziness, weakness or light-headedness.    03/22/2021 2:18 PM  Corey Park  has presented today for endoscopic procedure(s), with the diagnosis of  Encounter Diagnoses  Name Primary?   Dysphagia, unspecified type Yes   Screening for colorectal cancer    Gastroesophageal reflux disease, unspecified whether esophagitis present   .  The various methods of evaluation and treatment have been discussed with the patient and/or family. After consideration of risks, benefits and other options for treatment, the patient has consented to  the endoscopic procedure(s).   The patient's history has been reviewed, patient examined, no change in status, stable for endoscopic procedure(s).  I have reviewed the patient's chart and labs.  Questions were answered to the patient's satisfaction.     [name] E. Candis Schatz, MD [facility]

## 2021-03-22 NOTE — Progress Notes (Signed)
1354 Robinul 0.1 mg IV given due large amount of secretions upon assessment.  MD made aware, vss

## 2021-03-22 NOTE — Progress Notes (Signed)
BP  178/104,  Labetalol given IV, MD update, vss

## 2021-03-22 NOTE — Progress Notes (Signed)
Report given to PACU, vss 

## 2021-03-22 NOTE — Progress Notes (Signed)
Called to room to assist during endoscopic procedure.  Patient ID and intended procedure confirmed with present staff. Received instructions for my participation in the procedure from the performing physician.  

## 2021-03-22 NOTE — Op Note (Signed)
[place] Patient Name: Corey Park Procedure Date: 03/22/2021 2:19 PM MRN: [id number] Endoscopist: Nicki Reaper E. Candis Schatz , MD Age: 50 Referring MD:  Date of Birth: Oct 16, 1970 Gender: Male Account #: [id number] Procedure:                Colonoscopy Indications:              Screening for colorectal malignant neoplasm, This                            is the patient's first colonoscopy Medicines:                Monitored Anesthesia Care Procedure:                Pre-Anesthesia Assessment:                           - Prior to the procedure, a History and Physical                            was performed, and patient medications and                            allergies were reviewed. The patient's tolerance of                            previous anesthesia was also reviewed. The risks                            and benefits of the procedure and the sedation                            options and risks were discussed with the patient.                            All questions were answered, and informed consent                            was obtained. Prior Anticoagulants: The patient has                            taken no previous anticoagulant or antiplatelet                            agents. ASA Grade Assessment: III - A patient with                            severe systemic disease. After reviewing the risks                            and benefits, the patient was deemed in                            satisfactory condition to undergo the procedure.  After obtaining informed consent, the colonoscope                            was passed under direct vision. Throughout the                            procedure, the patient's blood pressure, pulse, and                            oxygen saturations were monitored continuously. The                            CF [id number] #[id number] was introduced through the anus                            and advanced to  the the cecum, identified by                            appendiceal orifice and ileocecal valve. The                            colonoscopy was performed without difficulty. The                            patient tolerated the procedure well. The quality                            of the bowel preparation was good. The ileocecal                            valve, appendiceal orifice, and rectum were                            photographed. Scope In: 2:46:45 PM Scope Out: 2:58:19 PM Scope Withdrawal Time: 0 hours 9 minutes 44 seconds  Total Procedure Duration: 0 hours 11 minutes 34 seconds  Findings:                 The perianal and digital rectal examinations were                            normal. Pertinent negatives include normal                            sphincter tone and no palpable rectal lesions.                           A few small-mouthed diverticula were found in the                            sigmoid colon.                           There was a large lipoma, 15 mm in diameter, in the  transverse colon.                           The exam was otherwise normal throughout the                            examined colon.                           Non-bleeding internal hemorrhoids were found during                            retroflexion. The hemorrhoids were Grade I                            (internal hemorrhoids that do not prolapse).                           No additional abnormalities were found on                            retroflexion. Complications:            No immediate complications. Estimated Blood Loss:     Estimated blood loss: none. Impression:               - Diverticulosis in the sigmoid colon.                           - Large lipoma in the transverse colon.                           - Non-bleeding internal hemorrhoids.                           - No specimens collected. Recommendation:           - Patient has a contact number  available for                            emergencies. The signs and symptoms of potential                            delayed complications were discussed with the                            patient. Return to normal activities tomorrow.                            Written discharge instructions were provided to the                            patient.                           - Resume previous diet.                           -  Continue present medications.                           - Repeat colonoscopy in 10 years for screening                            purposes. Synethia Endicott E. Candis Schatz, MD 03/22/2021 3:15:19 PM This report has been signed electronically.

## 2021-03-24 ENCOUNTER — Telehealth: Payer: Self-pay

## 2021-03-24 NOTE — Telephone Encounter (Signed)
  Follow up Call-  Call back number 03/22/2021  Post procedure Call Back phone  # [phone]  Permission to leave phone message Yes  Some recent data might be hidden     Patient questions:  Do you have a fever, pain , or abdominal swelling? No. Pain Score  0 *  Have you tolerated food without any problems? Yes.    Have you been able to return to your normal activities? Yes.    Do you have any questions about your discharge instructions: Diet   No. Medications  No. Follow up visit  No.  Do you have questions or concerns about your Care? No.  Actions: * If pain score is 4 or above: No action needed, pain <4.  Have you developed a fever since your procedure? no  2.   Have you had an respiratory symptoms (SOB or cough) since your procedure? no  3.   Have you tested positive for COVID 19 since your procedure no  4.   Have you had any family members/close contacts diagnosed with the COVID 19 since your procedure?  no   If yes to any of these questions please route to Joylene John, RN and Joella Prince, RN

## 2021-03-30 ENCOUNTER — Other Ambulatory Visit: Payer: Self-pay

## 2021-03-30 ENCOUNTER — Telehealth: Payer: Self-pay

## 2021-03-30 DIAGNOSIS — A048 Other specified bacterial intestinal infections: Secondary | ICD-10-CM

## 2021-03-30 MED ORDER — TALICIA 250-12.5-10 MG PO CPDR: 4.0000 | DELAYED_RELEASE_CAPSULE | Freq: Three times a day (TID) | ORAL | 0 refills | Status: DC

## 2021-03-30 NOTE — Telephone Encounter (Signed)
-----   Message from Daryel November, MD sent at 03/30/2021  6:25 AM EDT ----- The biopsies from the recent upper GI Endoscopy were notable for H. Pylori gastritis, and will plan on treating with quad therapy as below. Please confirm no medication allergies to the prescribed regimen.   1) Talicia (omeprazole/amoxicillin/rifabutin) 4 caps PO q8 hours x 14 days  4 weeks after treatment completed, check H. Pylori stool antigen to confirm eradication (must be off acid suppression therapy for 2 weeks prior to specimen submission)  H. Pylori may be the cause of your anemia and can also increase the risk of ulcers and stomach cancer, so should be eradicated.  The biopsies of the esophagus were unremarkable with no evidence of reflux esophagitis or eosinophilic esophagitis. The biopsies of the duodenum showed peptic duodenitis, which is typically related to irritation of the duodenum from stomach acid.

## 2021-03-30 NOTE — Telephone Encounter (Signed)
Left message for patient to please call back. 

## 2021-03-30 NOTE — Progress Notes (Signed)
The biopsies from the recent upper GI Endoscopy were notable for H. Pylori gastritis, and will plan on treating with quad therapy as below. Please confirm no medication allergies to the prescribed regimen.   1) Talicia (omeprazole/amoxicillin/rifabutin) 4 caps PO q8 hours x 14 days  4 weeks after treatment completed, check H. Pylori stool antigen to confirm eradication (must be off acid suppression therapy for 2 weeks prior to specimen submission)  H. Pylori may be the cause of your anemia and can also increase the risk of ulcers and stomach cancer, so should be eradicated.  The biopsies of the esophagus were unremarkable with no evidence of reflux esophagitis or eosinophilic esophagitis. The biopsies of the duodenum showed peptic duodenitis, which is typically related to irritation of the duodenum from stomach acid.

## 2021-04-04 ENCOUNTER — Other Ambulatory Visit: Payer: Self-pay

## 2021-04-04 MED ORDER — TALICIA 250-12.5-10 MG PO CPDR: 4.0000 | DELAYED_RELEASE_CAPSULE | Freq: Three times a day (TID) | ORAL | 0 refills | Status: DC

## 2021-04-06 ENCOUNTER — Ambulatory Visit: Payer: Medicare Other | Admitting: Cardiology

## 2021-04-11 ENCOUNTER — Ambulatory Visit (INDEPENDENT_AMBULATORY_CARE_PROVIDER_SITE_OTHER): Payer: Medicare Other | Admitting: Cardiology

## 2021-04-11 ENCOUNTER — Other Ambulatory Visit: Payer: Self-pay

## 2021-04-11 ENCOUNTER — Encounter: Payer: Self-pay | Admitting: Cardiology

## 2021-04-11 DIAGNOSIS — N185 Chronic kidney disease, stage 5: Secondary | ICD-10-CM | POA: Diagnosis not present

## 2021-04-11 DIAGNOSIS — I5022 Chronic systolic (congestive) heart failure: Secondary | ICD-10-CM

## 2021-04-11 DIAGNOSIS — N179 Acute kidney failure, unspecified: Secondary | ICD-10-CM

## 2021-04-11 MED ORDER — METOPROLOL SUCCINATE ER 100 MG PO TB24: 100.0000 mg | ORAL_TABLET | Freq: Every day | ORAL | 3 refills | Status: DC

## 2021-04-11 NOTE — Patient Instructions (Signed)
Medication Instructions:  Please discontinue Isosorbide and Carvedilol. Start Metoprolol Succinate 100 mg once a day. Continue all other medications as listed.  *If you need a refill on your cardiac medications before your next appointment, please call your pharmacy*  Follow-Up: At [facility], you and your health needs are our priority.  As part of our continuing mission to provide you with exceptional heart care, we have created designated Provider Care Teams.  These Care Teams include your primary Cardiologist (physician) and Advanced Practice Providers (APPs -  Physician Assistants and Nurse Practitioners) who all work together to provide you with the care you need, when you need it.  We recommend signing up for the patient portal called "MyChart".  Sign up information is provided on this After Visit Summary.  MyChart is used to connect with patients for Virtual Visits (Telemedicine).  Patients are able to view lab/test results, encounter notes, upcoming appointments, etc.  Non-urgent messages can be sent to your provider as well.   To learn more about what you can do with MyChart, go to [email or web address].    Your next appointment:   1 year(s)  The format for your next appointment:   In Person  Provider:   Candee Furbish, MD   Thank you for choosing [facility]!!

## 2021-04-11 NOTE — Progress Notes (Signed)
Cardiology Office Note:    Date:  04/11/2021   ID:  Corey Park, DOB 07-17-1970, MRN [id number]  PCP:  No primary care provider on file.   [facility] Providers Cardiologist:  Candee Furbish, MD     Referring MD: No ref. provider found     History of Present Illness:    Corey Park is a 50 y.o. male here for follow up systolic HF 26%. COVID-19 hospitalization 08/03/19, ESRD on HD, chronic systolic heart failure, hypertensive cardiomyopathy, moderate secondary pulmonary hypertension, blindness, DM2, HTN  June 2021 with cellulitis of perineum treated with antibiotics He presents today for follow-up with his step father.  His son acts as a caretaker and helps him with his medications.  Corey Park works at [employer] making transition to the [employer].  He reports compliance with dialysis Monday Wednesday Friday and goes at 4:30 PM. Still has hiccups, treated with Thorazine.  Has not been taking imdur. Occasionally skips coreg. HA. Asking about supplements to help with his energy, vitamin C, vitamin B for instance.  Encouraged him to try to take prescribed medications that have been proven to decrease mortality in the setting of heart failure.  Past Medical History:  Diagnosis Date   Anemia 2021   Asthma    as a child   Blind    CHF (congestive heart failure) ([place]) 2021   Diabetes mellitus without complication ([place])    type 2   Hypertension    Legally blind    B/L   Pneumonia 2021   Renal disorder    dialysis T-TH-Sat    Past Surgical History:  Procedure Laterality Date   BASCILIC VEIN TRANSPOSITION Right 10/16/2019   Procedure: Basilic Vein Transposition Right Arm;  Surgeon: Serafina Mitchell, MD;  Location: [place];  Service: Vascular;  Laterality: Right;   [place] Right 01/29/2020   Procedure: RIGHT ARM SECOND STAGE [place];  Surgeon: Serafina Mitchell, MD;  Location: [place];  Service: Vascular;  Laterality: Right;    BIOPSY  12/22/2018   Procedure: BIOPSY;  Surgeon: Laurence Spates, MD;  Location: [facility] ENDOSCOPY;  Service: Endoscopy;;   ESOPHAGOGASTRODUODENOSCOPY N/A 12/22/2018   Procedure: ESOPHAGOGASTRODUODENOSCOPY (EGD);  Surgeon: Laurence Spates, MD;  Location: [facility] ENDOSCOPY;  Service: Endoscopy;  Laterality: N/A;   IR FLUORO GUIDE CV LINE RIGHT  07/29/2019   IR US GUIDE VASC ACCESS RIGHT  07/29/2019    Current Medications: Current Meds  Medication Sig   ACETAMINOPHEN PO Take by mouth.   Amoxicill-Rifabutin-Omeprazole (TALICIA) 712-45.8-09 MG CPDR Take 4 capsules by mouth every 8 (eight) hours. Take for 14 days   calcium acetate (PHOSLO) 667 MG tablet Take 1,334 mg by mouth 3 (three) times daily.    chlorproMAZINE (THORAZINE) 25 MG tablet Take 1 tablet (25 mg total) by mouth 3 (three) times daily.   DIPHENHYDRAMINE HCL PO Take by mouth.   gabapentin (NEURONTIN) 100 MG capsule Take 100 mg by mouth daily as needed (hiccups).   IRON SUCROSE IV Inject 1 application as directed every Monday, Wednesday, and Friday. Done at dialysis   Methoxy PEG-Epoetin Beta (MIRCERA IJ) Mircera   metoprolol succinate (TOPROL-XL) 100 MG 24 hr tablet Take 1 tablet (100 mg total) by mouth daily. Take with or immediately following a meal.   multivitamin (RENA-VIT) TABS tablet Take 1 tablet by mouth daily.   ondansetron (ZOFRAN ODT) 4 MG disintegrating tablet Take 1 tablet (4 mg total) by mouth every 8 (eight) hours as needed for  nausea or vomiting.   pantoprazole (PROTONIX) 40 MG tablet Take 1 tablet (40 mg total) by mouth daily.   VITAMIN D PO Take by mouth.   [DISCONTINUED] COREG 25 MG tablet Take 25 mg by mouth 2 (two) times daily.   [DISCONTINUED] isosorbide mononitrate (IMDUR) 30 MG 24 hr tablet Take 1 tablet (30 mg total) by mouth daily.   Current Facility-Administered Medications for the 04/11/21 encounter (Office Visit) with Jerline Pain, MD  Medication   0.9 %  sodium chloride infusion     Allergies:   Patient has no  known allergies.   Social History   Socioeconomic History   Marital status: Single    Spouse name: Not on file   Number of children: Not on file   Years of education: Not on file   Highest education level: Not on file  Occupational History   Not on file  Tobacco Use   Smoking status: Never   Smokeless tobacco: Never  Vaping Use   Vaping Use: Never used  Substance and Sexual Activity   Alcohol use: Not Currently   Drug use: Not Currently    Types: Marijuana   Sexual activity: Not on file  Other Topics Concern   Not on file  Social History Narrative   Not on file   Social Determinants of Health   Financial Resource Strain: Not on file  Food Insecurity: Not on file  Transportation Needs: Not on file  Physical Activity: Not on file  Stress: Not on file  Social Connections: Not on file     Family History: The patient's family history includes CAD in his mother; Hypertension in his mother. There is no history of Diabetes, Stroke, Cancer, Kidney failure, Stomach cancer, Colon cancer, or Rectal cancer.  ROS:   Please see the history of present illness.     All other systems reviewed and are negative.  EKGs/Labs/Other Studies Reviewed:    The following studies were reviewed today: ECHO 12/2020:    1. Left ventricular ejection fraction, by estimation, is 40 to 45%. Left  ventricular ejection fraction by PLAX is 45 %. The left ventricle has  mildly decreased function. The left ventricle has no regional wall motion  abnormalities. There is moderate  left ventricular hypertrophy. Indeterminate diastolic filling due to E-A  fusion.   2. Right ventricular systolic function is normal. The right ventricular  size is normal. Tricuspid regurgitation signal is inadequate for assessing  PA pressure.   3. The mitral valve is grossly normal. Trivial mitral valve  regurgitation.   4. The aortic valve is tricuspid. Aortic valve regurgitation is not  visualized.   5. The inferior  vena cava is normal in size with <50% respiratory  variability, suggesting right atrial pressure of 8 mmHg   Recent Labs: 12/10/2020: BUN 27; Creatinine, Ser 8.26; Potassium 3.7; Sodium 136 03/22/2021: Hemoglobin 8.4 Repeated and verified X2.; Platelets 264.0  Recent Lipid Panel    Component Value Date/Time   CHOL 167 01/16/2019 1517   TRIG 83 01/16/2019 1517   HDL 41 01/16/2019 1517   CHOLHDL 4.1 01/16/2019 1517   CHOLHDL 4.4 06/17/2018 0444   VLDL 12 06/17/2018 0444   LDLCALC 109 (H) 01/16/2019 1517     Risk Assessment/Calculations:          Physical Exam:    VS:  BP (!) 156/70 (BP Location: Left Arm, Patient Position: Sitting, Cuff Size: Normal)   Pulse (!) 104   Ht 6' (1.829 m)  Wt 230 lb (104.3 kg)   SpO2 98%   BMI 31.19 kg/m     Wt Readings from Last 3 Encounters:  04/11/21 230 lb (104.3 kg)  03/22/21 227 lb (103 kg)  03/15/21 234 lb 9.6 oz (106.4 kg)     GEN:  Well nourished, well developed in no acute distress HEENT: Blind NECK: No JVD; No carotid bruits LYMPHATICS: No lymphadenopathy CARDIAC: Tachy reg , no murmurs, rubs, gallops RESPIRATORY:  Clear to auscultation without rales, wheezing or rhonchi  ABDOMEN: Soft, non-tender, non-distended MUSCULOSKELETAL:  No edema; No deformity Left arm fistula SKIN: Warm and dry NEUROLOGIC:  Alert and oriented x 3 PSYCHIATRIC:  Normal affect   ASSESSMENT:    1. Chronic systolic heart failure ([place])   2. Acute renal failure superimposed on stage 5 chronic kidney disease, not on chronic dialysis, unspecified acute renal failure type ([place])    PLAN:    In order of problems listed above: Chronic systolic heart failure ([place]) He states that the carvedilol 25 mg twice a day is giving him a headache.  His heart rate currently is 104.  I asked him if he was taking any does state that he misses it quite often.  I will go ahead and change him over to Toprol-XL 100 mg a day. He also has not been taking the isosorbide, we  will stop this, take it off of his medication list.  I wonder if this was the medication that periodically was giving him in the headache.  Likely not providing much of a clinical benefit for him.  Ejection fraction 40 to 45% improved slightly from before.  Excellent.   Acute renal failure superimposed on stage 5 chronic kidney disease, not on chronic dialysis [facility]) Continuing with hemodialysis as described above.  This is where his fluid balance is are being regulated.     Medication Adjustments/Labs and Tests Ordered: Current medicines are reviewed at length with the patient today.  Concerns regarding medicines are outlined above.  No orders of the defined types were placed in this encounter.  Meds ordered this encounter  Medications   metoprolol succinate (TOPROL-XL) 100 MG 24 hr tablet    Sig: Take 1 tablet (100 mg total) by mouth daily. Take with or immediately following a meal.    Dispense:  90 tablet    Refill:  3    Patient Instructions  Medication Instructions:  Please discontinue Isosorbide and Carvedilol. Start Metoprolol Succinate 100 mg once a day. Continue all other medications as listed.  *If you need a refill on your cardiac medications before your next appointment, please call your pharmacy*  Follow-Up: At [facility], you and your health needs are our priority.  As part of our continuing mission to provide you with exceptional heart care, we have created designated Provider Care Teams.  These Care Teams include your primary Cardiologist (physician) and Advanced Practice Providers (APPs -  Physician Assistants and Nurse Practitioners) who all work together to provide you with the care you need, when you need it.  We recommend signing up for the patient portal called "MyChart".  Sign up information is provided on this After Visit Summary.  MyChart is used to connect with patients for Virtual Visits (Telemedicine).  Patients are able to view lab/test results,  encounter notes, upcoming appointments, etc.  Non-urgent messages can be sent to your provider as well.   To learn more about what you can do with MyChart, go to [email or web address].    Your next  appointment:   1 year(s)  The format for your next appointment:   In Person  Provider:   Candee Furbish, MD   Thank you for choosing [facility]!!     Signed, Candee Furbish, MD  04/11/2021 11:15 AM    [place]

## 2021-04-11 NOTE — Assessment & Plan Note (Signed)
He states that the carvedilol 25 mg twice a day is giving him a headache.  His heart rate currently is 104.  I asked him if he was taking any does state that he misses it quite often.  I will go ahead and change him over to Toprol-XL 100 mg a day. He also has not been taking the isosorbide, we will stop this, take it off of his medication list.  I wonder if this was the medication that periodically was giving him in the headache.  Likely not providing much of a clinical benefit for him.  Ejection fraction 40 to 45% improved slightly from before.  Excellent.

## 2021-04-11 NOTE — Assessment & Plan Note (Signed)
Continuing with hemodialysis as described above.  This is where his fluid balance is are being regulated.

## 2021-04-13 ENCOUNTER — Ambulatory Visit: Payer: Medicare Other | Admitting: Cardiology

## 2021-04-15 ENCOUNTER — Other Ambulatory Visit: Payer: Self-pay | Admitting: Gastroenterology

## 2021-04-28 ENCOUNTER — Ambulatory Visit: Payer: Medicare Other | Admitting: Cardiology

## 2021-05-17 ENCOUNTER — Telehealth: Payer: Self-pay

## 2021-05-17 NOTE — Telephone Encounter (Signed)
-----   Message from Marlon Pel, RN sent at 04/05/2021  3:11 PM EDT ----- Needs h pylori stool antigenCandis Schatz

## 2021-05-17 NOTE — Telephone Encounter (Signed)
Called patient about H. Pylori stool test and instructed patient to hold protonix for 2 weeks before collecting stool. Pt verbalized understanding.

## 2021-06-01 ENCOUNTER — Ambulatory Visit (HOSPITAL_COMMUNITY): Payer: Medicare Other | Attending: Student

## 2021-09-20 ENCOUNTER — Encounter: Payer: Self-pay | Admitting: Internal Medicine

## 2021-09-20 ENCOUNTER — Ambulatory Visit: Payer: Medicare Other | Admitting: Internal Medicine

## 2021-09-20 NOTE — Progress Notes (Deleted)
[name] is a 51 year old person with past medical history of HFrEF (EF 40-45% 7/22). Pulmonary hypertension, legally blind, uncontrolled type 2 diabetes mellitus, ESRD on HD, hypertension ? ?PMH: ?Medications: ?Surgical history: ?Family history: ?Social history: ?Tobacco, EtOH ? ?HFrEF ?Follows with Dr. Candee Park of [facility], last seen 10/22. ? ?Medications: Toprol-XL 100 mg qd ? ?ESRD on HD on T/R/Sa 2/2 to DM and HTN ? ?A/P: ?BMP ? ?Diabetes ?[name] presents with history of diabetes. This has been uncontrolled in the past, but has been consistently <6.3 for last 4 years.  ?A/P: ?Recheck Hgb A1c ? 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 ?A/P: ?CBC ? ?HLD ?Lab Results  ?Component Value Date  ? CHOL 167 01/16/2019  ? HDL 41 01/16/2019  ? LDLCALC 109 (H) 01/16/2019  ? TRIG 83 01/16/2019  ? CHOLHDL 4.1 01/16/2019  ?ASCVD risk elevated at 22% from prior lipid panel. CT showed extensive coronary artery calcification with aortic atherosclerosis. From chart review patient took atorvastatin 20 mg in the past.  ? ?Dysphagia ?Multifocal consolidative pulmonary nodules ?Seen by GI for acid reflux.  ?CT 9/22 showed widespread asymmetric pulmonary infiltrates. They thought patient might be having microaspiration from acid reflux. EGD completed 10/22 and showed H pylori gastritis with no evidence of reflux esophagitis or eosonophilic esophagitis. Pulmonology evaluated patient as well and ordered PFT and repeat imaging around 01/23.  ?A/P: ?Recheck H pylori stool antigen vs other test? ?Repeat CT chest? ?

## 2021-10-11 ENCOUNTER — Ambulatory Visit (INDEPENDENT_AMBULATORY_CARE_PROVIDER_SITE_OTHER): Payer: Medicare Other | Admitting: Podiatry

## 2021-10-11 ENCOUNTER — Encounter: Payer: Self-pay | Admitting: Podiatry

## 2021-10-11 DIAGNOSIS — D689 Coagulation defect, unspecified: Secondary | ICD-10-CM | POA: Diagnosis not present

## 2021-10-11 DIAGNOSIS — B351 Tinea unguium: Secondary | ICD-10-CM

## 2021-10-11 DIAGNOSIS — R809 Proteinuria, unspecified: Secondary | ICD-10-CM

## 2021-10-11 DIAGNOSIS — M79674 Pain in right toe(s): Secondary | ICD-10-CM | POA: Diagnosis not present

## 2021-10-11 DIAGNOSIS — E1129 Type 2 diabetes mellitus with other diabetic kidney complication: Secondary | ICD-10-CM | POA: Diagnosis not present

## 2021-10-11 DIAGNOSIS — N186 End stage renal disease: Secondary | ICD-10-CM | POA: Diagnosis not present

## 2021-10-11 DIAGNOSIS — Z992 Dependence on renal dialysis: Secondary | ICD-10-CM

## 2021-10-11 DIAGNOSIS — M79675 Pain in left toe(s): Secondary | ICD-10-CM

## 2021-10-11 NOTE — Progress Notes (Signed)
This patient presents  to my office for at risk foot care.  This patient requires this care by a professional since this patient will be at risk due to having CKD and type 2 diabetes. Patient is also blind.This patient is unable to cut nails himself since the patient cannot reach his nails.These nails are painful walking and wearing shoes.  This patient presents for at risk foot care today. ? ?General Appearance  Alert, conversant and in no acute stress. ? ?Vascular  Dorsalis pedis and posterior tibial  pulses are  weakly palpable  bilaterally.  Capillary return is within normal limits  bilaterally. Temperature is within normal limits  bilaterally. ? ?Neurologic  Senn-Weinstein monofilament wire test diminished/absent bilaterally. Muscle power within normal limits bilaterally. ? ?Nails Thick disfigured discolored nails with subungual debris  from hallux to fifth toes bilaterally. No evidence of bacterial infection or drainage bilaterally. ? ?Orthopedic  No limitations of motion  feet .  No crepitus or effusions noted.  No bony pathology or digital deformities noted. ? ?Skin  normotropic skin with no porokeratosis noted bilaterally.  No signs of infections or ulcers noted.    ? ?Onychomycosis  Pain in right toes  Pain in left toes ? ?Consent was obtained for treatment procedures.   Mechanical debridement of nails 1-5  bilaterally performed with a nail nipper.  Filed with dremel without incident.  ? ? ?Return office visit   3 months                   Told patient to return for periodic foot care and evaluation due to potential at risk complications. ? ? ?Gardiner Barefoot DPM   ?

## 2022-01-10 ENCOUNTER — Ambulatory Visit: Payer: Medicare Other | Admitting: Podiatry

## 2022-03-20 ENCOUNTER — Encounter (HOSPITAL_COMMUNITY): Payer: Self-pay | Admitting: Emergency Medicine

## 2022-03-20 ENCOUNTER — Other Ambulatory Visit: Payer: Self-pay

## 2022-03-20 ENCOUNTER — Emergency Department (HOSPITAL_COMMUNITY): Payer: Medicare Other

## 2022-03-20 ENCOUNTER — Emergency Department (HOSPITAL_COMMUNITY)
Admission: EM | Admit: 2022-03-20 | Discharge: 2022-03-21 | Disposition: A | Discharge status: Home / Self Care | Payer: Medicare Other | Attending: Emergency Medicine | Admitting: Emergency Medicine

## 2022-03-20 DIAGNOSIS — R197 Diarrhea, unspecified: Secondary | ICD-10-CM | POA: Diagnosis not present

## 2022-03-20 DIAGNOSIS — R0602 Shortness of breath: Secondary | ICD-10-CM | POA: Diagnosis not present

## 2022-03-20 DIAGNOSIS — R112 Nausea with vomiting, unspecified: Secondary | ICD-10-CM | POA: Diagnosis not present

## 2022-03-20 DIAGNOSIS — N186 End stage renal disease: Secondary | ICD-10-CM | POA: Insufficient documentation

## 2022-03-20 DIAGNOSIS — R0789 Other chest pain: Secondary | ICD-10-CM | POA: Insufficient documentation

## 2022-03-20 DIAGNOSIS — Z20822 Contact with and (suspected) exposure to covid-19: Secondary | ICD-10-CM | POA: Insufficient documentation

## 2022-03-20 DIAGNOSIS — Z79899 Other long term (current) drug therapy: Secondary | ICD-10-CM | POA: Diagnosis not present

## 2022-03-20 DIAGNOSIS — Z992 Dependence on renal dialysis: Secondary | ICD-10-CM | POA: Insufficient documentation

## 2022-03-20 DIAGNOSIS — I120 Hypertensive chronic kidney disease with stage 5 chronic kidney disease or end stage renal disease: Secondary | ICD-10-CM | POA: Insufficient documentation

## 2022-03-20 DIAGNOSIS — R079 Chest pain, unspecified: Secondary | ICD-10-CM | POA: Diagnosis present

## 2022-03-20 LAB — TROPONIN I (HIGH SENSITIVITY)
Troponin I (High Sensitivity): 108 ng/L (ref <18)
Troponin I (High Sensitivity): 101 ng/L (ref <18)

## 2022-03-20 LAB — CBC WITH DIFFERENTIAL/PLATELET
Abs Immature Granulocytes: 0.01 10*3/uL (ref 0.00–0.07)
Basophils Absolute: 0 10*3/uL (ref 0.0–0.1)
Basophils Relative: 0 %
Eosinophils Absolute: 0.1 10*3/uL (ref 0.0–0.5)
Eosinophils Relative: 2 %
HCT: 41.4 % (ref 39.0–52.0)
Hemoglobin: 14.3 g/dL (ref 13.0–17.0)
Immature Granulocytes: 0 %
Lymphocytes Relative: 17 %
Lymphs Abs: 0.9 10*3/uL (ref 0.7–4.0)
MCH: 33.5 pg (ref 26.0–34.0)
MCHC: 34.5 g/dL (ref 30.0–36.0)
MCV: 97 fL (ref 80.0–100.0)
Monocytes Absolute: 0.7 10*3/uL (ref 0.1–1.0)
Monocytes Relative: 13 %
Neutro Abs: 3.6 10*3/uL (ref 1.7–7.7)
Neutrophils Relative %: 68 %
Platelets: 87 10*3/uL — ABNORMAL LOW (ref 150–400)
RBC: 4.27 MIL/uL (ref 4.22–5.81)
RDW: 13.9 % (ref 11.5–15.5)
WBC: 5.3 10*3/uL (ref 4.0–10.5)
nRBC: 0 % (ref 0.0–0.2)

## 2022-03-20 LAB — COMPREHENSIVE METABOLIC PANEL
ALT: 15 U/L (ref 0–44)
AST: 26 U/L (ref 15–41)
Albumin: 3.5 g/dL (ref 3.5–5.0)
Alkaline Phosphatase: 56 U/L (ref 38–126)
Anion gap: 17 — ABNORMAL HIGH (ref 5–15)
BUN: 20 mg/dL (ref 6–20)
CO2: 41 mmol/L — ABNORMAL HIGH (ref 22–32)
Calcium: 8.1 mg/dL — ABNORMAL LOW (ref 8.9–10.3)
Chloride: 81 mmol/L — ABNORMAL LOW (ref 98–111)
Creatinine, Ser: 10.81 mg/dL — ABNORMAL HIGH (ref 0.61–1.24)
GFR, Estimated: 5 mL/min — ABNORMAL LOW (ref >60)
Glucose, Bld: 118 mg/dL — ABNORMAL HIGH (ref 70–99)
Potassium: 3.2 mmol/L — ABNORMAL LOW (ref 3.5–5.1)
Sodium: 139 mmol/L (ref 135–145)
Total Bilirubin: 1.2 mg/dL (ref 0.3–1.2)
Total Protein: 7.2 g/dL (ref 6.5–8.1)

## 2022-03-20 LAB — BRAIN NATRIURETIC PEPTIDE: B Natriuretic Peptide: 1519.4 pg/mL — ABNORMAL HIGH (ref 0.0–100.0)

## 2022-03-20 LAB — ETHANOL: Alcohol, Ethyl (B): <10 mg/dL (ref <10)

## 2022-03-20 LAB — HEPATITIS B SURFACE ANTIGEN: Hepatitis B Surface Ag: NONREACTIVE

## 2022-03-20 LAB — SARS CORONAVIRUS 2 BY RT PCR: SARS Coronavirus 2 by RT PCR: NEGATIVE

## 2022-03-20 MED ORDER — LIDOCAINE-PRILOCAINE 2.5-2.5 % EX CREA: 1.0000 | TOPICAL_CREAM | CUTANEOUS | Status: DC | PRN

## 2022-03-20 MED ORDER — ALUM & MAG HYDROXIDE-SIMETH 200-200-20 MG/5ML PO SUSP
15.0000 mL | ORAL | Status: DC | PRN
  Administered 2022-03-20: 15 mL via ORAL
  Filled 2022-03-20: qty 30

## 2022-03-20 MED ORDER — CHLORHEXIDINE GLUCONATE CLOTH 2 % EX PADS: 6.0000 | MEDICATED_PAD | Freq: Every day | CUTANEOUS | Status: DC

## 2022-03-20 MED ORDER — ONDANSETRON HCL 4 MG/2ML IJ SOLN
4.0000 mg | Freq: Once | INTRAMUSCULAR | Status: AC
  Administered 2022-03-20: 4 mg via INTRAVENOUS
  Filled 2022-03-20: qty 2

## 2022-03-20 MED ORDER — METOPROLOL SUCCINATE ER 25 MG PO TB24
100.0000 mg | ORAL_TABLET | Freq: Every day | ORAL | Status: DC
  Administered 2022-03-21: 100 mg via ORAL
  Filled 2022-03-20: qty 4

## 2022-03-20 MED ORDER — LORAZEPAM 2 MG/ML IJ SOLN
1.0000 mg | Freq: Once | INTRAMUSCULAR | Status: AC
  Administered 2022-03-20: 1 mg via INTRAVENOUS
  Filled 2022-03-20: qty 1

## 2022-03-20 MED ORDER — ANTICOAGULANT SODIUM CITRATE 4% (200MG/5ML) IV SOLN: 5.0000 mL | Status: DC | PRN

## 2022-03-20 MED ORDER — PENTAFLUOROPROP-TETRAFLUOROETH EX AERO: 1.0000 | INHALATION_SPRAY | CUTANEOUS | Status: DC | PRN

## 2022-03-20 MED ORDER — LIDOCAINE HCL (PF) 1 % IJ SOLN: 5.0000 mL | INTRAMUSCULAR | Status: DC | PRN

## 2022-03-20 MED ORDER — ONDANSETRON 4 MG PO TBDP: 4.0000 mg | ORAL_TABLET | Freq: Three times a day (TID) | ORAL | Status: DC | PRN

## 2022-03-20 MED ORDER — HEPARIN SODIUM (PORCINE) 1000 UNIT/ML DIALYSIS: 1000.0000 [IU] | INTRAMUSCULAR | Status: DC | PRN

## 2022-03-20 MED ORDER — CHLORPROMAZINE HCL 25 MG PO TABS
25.0000 mg | ORAL_TABLET | Freq: Three times a day (TID) | ORAL | Status: DC
  Administered 2022-03-20: 25 mg via ORAL
  Filled 2022-03-20 (×2): qty 1

## 2022-03-20 MED ORDER — ALTEPLASE 2 MG IJ SOLR: 2.0000 mg | Freq: Once | INTRAMUSCULAR | Status: DC | PRN

## 2022-03-20 MED ORDER — RENA-VITE PO TABS
1.0000 | ORAL_TABLET | Freq: Every day | ORAL | Status: DC
  Filled 2022-03-20: qty 1

## 2022-03-20 MED ORDER — LABETALOL HCL 5 MG/ML IV SOLN
10.0000 mg | INTRAVENOUS | Status: AC | PRN
  Administered 2022-03-20 (×3): 10 mg via INTRAVENOUS
  Filled 2022-03-20 (×2): qty 4

## 2022-03-20 NOTE — ED Triage Notes (Signed)
Pt BIB [facility] to [facility]. EMS reported they were called at pt hemodialysis center patient was c/o chest pain, he only had 1 hour of dialysis. It was reported by EMS patient last dialysis was last Wednesday the 20th, and he missed dialysis on last Friday the 22nd. Patient also reported to EMS he was nauseated and vomiting for 4 days.

## 2022-03-20 NOTE — ED Provider Notes (Signed)
[facility] DEPARTMENT Provider Note   CSN: [id number] Arrival date & time: 03/20/22  1807     History  Chief Complaint  Patient presents with   Chest Pain   Nausea, Vomiting, Diarrhea    Corey Park is a 51 y.o. male.   Patient presents via EMS from dialysis after he developed chest pain.  Patient has multiple medical problems including hypertension, end-stage renal disease.  He missed his last dialysis session 3 days ago, last had 5 days ago.  He notes that since about that time he has had nausea, vomiting, diarrhea, weakness, and subsequently developed chest pain today.  He baseline has some dyspnea, this is seemingly unchanged.  EMS reports the patient was awake, alert, hypertensive in transport.    Home Medications Prior to Admission medications   Medication Sig Start Date End Date Taking? Authorizing Provider  Amoxicill-Rifabutin-Omeprazole (TALICIA) 790-24.0-97 MG CPDR Take 4 capsules by mouth every 8 (eight) hours. Take for 14 days 04/04/21   Daryel November, MD  calcium acetate (PHOSLO) 667 MG tablet Take 1,334 mg by mouth 3 (three) times daily.  09/12/19   [provider]  chlorproMAZINE (THORAZINE) 25 MG tablet TAKE 1 TABLET BY MOUTH THREE TIMES DAILY 04/18/21   Daryel November, MD  DIPHENHYDRAMINE HCL PO Take by mouth. 02/07/21 02/06/22  [provider]  IRON SUCROSE IV Inject 1 application as directed every Monday, Wednesday, and Friday. Done at dialysis 11/26/19 04/11/21  [provider]  metoprolol succinate (TOPROL-XL) 100 MG 24 hr tablet Take 1 tablet (100 mg total) by mouth daily. Take with or immediately following a meal. 04/11/21   Jerline Pain, MD  multivitamin (RENA-VIT) TABS tablet Take 1 tablet by mouth daily. 08/29/19   [provider]  ondansetron (ZOFRAN ODT) 4 MG disintegrating tablet Take 1 tablet (4 mg total) by mouth every 8 (eight) hours as needed for nausea or vomiting. 03/08/20    Deno Etienne, DO  pantoprazole (PROTONIX) 40 MG tablet Take 1 tablet (40 mg total) by mouth daily. 12/10/20   Loel Dubonnet, NP      Allergies    Patient has no known allergies.    Review of Systems   Review of Systems  All other systems reviewed and are negative.   Physical Exam Updated Vital Signs BP (!) 188/101   Pulse 80   Temp 98.1 F (36.7 C) (Oral)   Resp 17   Ht '6\' 1"'$  (1.854 m)   Wt 97.5 kg   SpO2 98%   BMI 28.37 kg/m  Physical Exam Vitals and nursing note reviewed.  Constitutional:      General: He is not in acute distress.    Appearance: He is well-developed.  HENT:     Head: Normocephalic and atraumatic.  Eyes:     Conjunctiva/sclera: Conjunctivae normal.  Cardiovascular:     Rate and Rhythm: Normal rate and regular rhythm.  Pulmonary:     Effort: Pulmonary effort is normal. No respiratory distress.     Breath sounds: No stridor.  Abdominal:     General: There is no distension.  Musculoskeletal:     Comments: Right av fistula with clamp in place, no active bleeding  Skin:    General: Skin is warm and dry.  Neurological:     Mental Status: He is alert and oriented to person, place, and time.     Comments: Patient blind, unchanged, denies new complaints     ED Results /  Procedures / Treatments   Labs (all labs ordered are listed, but only abnormal results are displayed) Labs Reviewed  COMPREHENSIVE METABOLIC PANEL - Abnormal; Notable for the following components:      Result Value   Potassium 3.2 (*)    Chloride 81 (*)    CO2 41 (*)    Glucose, Bld 118 (*)    Creatinine, Ser 10.81 (*)    Calcium 8.1 (*)    GFR, Estimated 5 (*)    Anion gap 17 (*)    All other components within normal limits  BRAIN NATRIURETIC PEPTIDE - Abnormal; Notable for the following components:   B Natriuretic Peptide 1,519.4 (*)    All other components within normal limits  CBC WITH DIFFERENTIAL/PLATELET - Abnormal; Notable for the following components:   Platelets  87 (*)    All other components within normal limits  TROPONIN I (HIGH SENSITIVITY) - Abnormal; Notable for the following components:   Troponin I (High Sensitivity) 108 (*)    All other components within normal limits  TROPONIN I (HIGH SENSITIVITY) - Abnormal; Notable for the following components:   Troponin I (High Sensitivity) 101 (*)    All other components within normal limits  SARS CORONAVIRUS 2 BY RT PCR  ETHANOL  HEPATITIS B SURFACE ANTIGEN  HEPATITIS B SURFACE ANTIBODY,QUALITATIVE  HEPATITIS B SURFACE ANTIBODY, QUANTITATIVE  HEPATITIS B CORE ANTIBODY, TOTAL  HEPATITIS C ANTIBODY    EKG EKG Interpretation  Date/Time:  Monday March 20 2022 18:18:37 EDT Ventricular Rate:  75 PR Interval:  181 QRS Duration: 104 QT Interval:  454 QTC Calculation: 508 R Axis:   -37 Text Interpretation: Sinus rhythm Probable left atrial enlargement LVH with secondary repolarization abnormality Prolonged QT interval T wave abnormality Abnormal ECG Confirmed by Carmin Muskrat [phone]) on 03/20/2022 6:22:00 PM  Radiology DG Chest Port 1 View  Result Date: 03/20/2022 CLINICAL DATA:  Chest pain EXAM: PORTABLE CHEST 1 VIEW COMPARISON:  None Available. FINDINGS: Patient is rotated to the right. Heart size and mediastinal contours are within normal limits. Both lungs are clear. Possible trace right pleural effusion. No evidence of pneumothorax. IMPRESSION: 1. No consolidation. 2. Possible trace right pleural effusion. Electronically Signed   By: Yetta Glassman M.D.   On: 03/20/2022 18:44    Procedures Procedures    Medications Ordered in ED Medications  Chlorhexidine Gluconate Cloth 2 % PADS 6 each (has no administration in time range)  pentafluoroprop-tetrafluoroeth (GEBAUERS) aerosol 1 Application (has no administration in time range)  lidocaine (PF) (XYLOCAINE) 1 % injection 5 mL (has no administration in time range)  lidocaine-prilocaine (EMLA) cream 1 Application (has no administration  in time range)  heparin injection 1,000 Units (has no administration in time range)  anticoagulant sodium citrate solution 5 mL (has no administration in time range)  alteplase (CATHFLO ACTIVASE) injection 2 mg (has no administration in time range)  alum & mag hydroxide-simeth (MAALOX/MYLANTA) 200-200-20 MG/5ML suspension 15 mL (15 mLs Oral Given 03/20/22 2250)  chlorproMAZINE (THORAZINE) tablet 25 mg (25 mg Oral Given 03/20/22 2253)  metoprolol succinate (TOPROL-XL) 24 hr tablet 100 mg (has no administration in time range)  multivitamin (RENA-VIT) tablet 1 tablet (has no administration in time range)  ondansetron (ZOFRAN-ODT) disintegrating tablet 4 mg (has no administration in time range)  LORazepam (ATIVAN) injection 1 mg (1 mg Intravenous Given 03/20/22 2041)  labetalol (NORMODYNE) injection 10 mg (10 mg Intravenous Given 03/20/22 2223)  ondansetron (ZOFRAN) injection 4 mg (4 mg Intravenous Given 03/20/22 2250)  ED Course/ Medical Decision Making/ A&P  This patient with a Hx of end-stage renal disease, blindness, hiccups for the past 4 years presents to the ED for concern of chest pain, nausea while at dialysis, this involves an extensive number of treatment options, and is a complaint that carries with it a high risk of complications and morbidity.    The differential diagnosis includes hypertensive crisis, pulmonary edema, hyperkalemia, infection, bacteremia, sepsis, ACS   Social Determinants of Health:  Blindness, end-stage renal disease  Additional history obtained:  Additional history and/or information obtained from family member at bedside, EMS notes, and nephrology nurse, notable for details included in HPI, and per nephrology nurse, the patient was encouraged to continue his dialysis session today, but declined to do so, preferring to be transferred here due to his symptoms.   After the initial evaluation, orders, including: Labs x-ray were initiated.   Patient placed on  Cardiac and Pulse-Oximetry Monitors. The patient was maintained on a cardiac monitor.  The cardiac monitored showed an rhythm of 80 sinus normal The patient was also maintained on pulse oximetry. The readings were typically 100% room air normal   On repeat evaluation of the patient improved Patient much more calm on repeat exam, no vomiting Lab Tests:  I personally interpreted labs.  The pertinent results include: Potassium only 3.2, creatinine 10.8, troponin 80, 100 consistent with prior.  Patient also has BNP 1500  Imaging Studies ordered:  I independently visualized and interpreted imaging which showed trace pleural effusion I agree with the radiologist interpretation  Consultations Obtained:Dispostion / Final MDM:  I requested consultation with the nephrology, and we discussed his case at length.  This adult male was presenting from dialysis, where he had not been for 5 days after missing a prior session, now with concern for multiple complaints, chest pain, dyspnea, nausea, vomiting.  Patient does have hiccups, though this has been chronic for years, and there is a soft, nonperitoneal abdomen, little evidence for intra-abdominal processes. Patient is found to have normal potassium value, and without oxygen requirement, with x-ray not demonstrate pneumonia, labs consistent with having missed dialysis, essentially twice over the past week, patient does require dialysis and I discussed this, as above, with our nephrology team.  Given his need for dialysis, this will be facilitated, but the patient is likely appropriate for discharge home from that point, given the aforementioned absence of supplemental oxygen requirement, nonischemic EKG, though his troponin is elevated, he has had this prior, and this is consistent with known renal dysfunction.  Patient amenable to plan.  Patient awaiting dialysis on signout.  Home meds ordered, antiemetics ordered, Maalox for indigestion ordered.  Final  Clinical Impression(s) / ED Diagnoses Final diagnoses:  Atypical chest pain     Carmin Muskrat, MD 03/20/22 2308

## 2022-03-21 LAB — HEPATITIS B SURFACE ANTIBODY,QUALITATIVE: Hep B S Ab: REACTIVE — AB

## 2022-03-21 LAB — HEPATITIS C ANTIBODY: HCV Ab: NONREACTIVE

## 2022-03-21 LAB — HEPATITIS B CORE ANTIBODY, TOTAL: Hep B Core Total Ab: NONREACTIVE

## 2022-03-21 MED ORDER — SUCRALFATE 1 GM/10ML PO SUSP
1.0000 g | Freq: Three times a day (TID) | ORAL | Status: DC
  Filled 2022-03-21 (×3): qty 10

## 2022-03-21 MED ORDER — GABAPENTIN 300 MG PO CAPS
300.0000 mg | ORAL_CAPSULE | Freq: Once | ORAL | Status: AC
  Administered 2022-03-21: 300 mg via ORAL
  Filled 2022-03-21: qty 1

## 2022-03-21 NOTE — ED Notes (Signed)
DC instructions reviewed with pt. Pt verbalized understanding.  PT DC and picked up by family.

## 2022-03-21 NOTE — Progress Notes (Signed)
Transition of Care [facility]) - Emergency Department Mini Assessment   Patient Details  Name: Corey Park MRN: [id number] Date of Birth: 1971-01-26  [facility] of [facility]) CM/SW Contact:    Fuller Mandril, RN Phone Number: 03/21/2022, 11:53 AM   Clinical Narrative: RNCM consulted regarding PCP establishment. Pt presented to [facility] ED today with chest pain. RNCM met with pt at bedside; pt confirms not having access to follow up care with PCP. Discussed with patient importance and benefits of establishing PCP, and not utilizing the ED for primary care needs. Pt verbalized understanding and is in agreement. Discussed other options, provided list of local  affordable PCPs.  Pt voiced interest in [facility].  RNCM obtained appointment on (10/11), time (2:25) and placed on After Visit Summary paperwork.  No further case management needs communicated at this time. [name] J. Clydene Laming, RN, BSN, [place] [phone]    ED Mini Assessment:    Barriers to Discharge: No Barriers Identified        Interventions which prevented an admission or readmission: PCP Appointment Scheduled    Patient Contact and Communications        ,          Patient states their goals for this hospitalization and ongoing recovery are:: (P) Make sure my heart is ok before I go home      Admission diagnosis:  N/V/D; CP; Dialysis pt Patient Active Problem List   Diagnosis Date Noted   Pain due to onychomycosis of toenails of both feet 10/11/2021   Hypercalcemia 04/13/2020   Coagulation defect, unspecified ([place]) 08/25/2019   Secondary hyperparathyroidism of renal origin ([place]) 08/25/2019   Anemia in chronic kidney disease (CKD) 12/23/2018   Chronic kidney disease on chronic dialysis ([place])    Chronic systolic heart failure ([place]) 07/03/2018   HLD (hyperlipidemia) 06/14/2007   DM (diabetes mellitus), type 2 ([place]) 06/11/2007   Hypertensive heart disease with CHF (congestive heart failure) ([place])  06/11/2007   PCP:  Default, Provider, MD Pharmacy:   [facility], [address] [place] [address] Phone: [phone] Fax: [phone]  [address] [place], [address] [place] [id number] [place] [place] [address] Phone: [phone] Fax: [phone]

## 2022-03-21 NOTE — Progress Notes (Signed)
Brief note:  Came to ED for SOB during dialysis Had only 1 hr 30 min of rx Here to finish rx  Patient seen and examined on Hemodialysis. BP (!) 166/89   Pulse 79   Temp 98.3 F (36.8 C) (Oral)   Resp (!) 24   Ht '6\' 1"'$  (1.854 m)   Wt 97.5 kg   SpO2 98%   BMI 28.37 kg/m   QB 400 mL/ min via AVF, UF goal 3.5L  Having some hiccups and some post-tussive emesis.  OK from renal perspective to d/c after HD from ED if VSS.   Madelon Lips MD [place] Pgr [phone] 3:06 PM

## 2022-03-21 NOTE — ED Provider Notes (Signed)
6:01 PM Patient back from dialysis, calm, no increased work of breathing, only complains of heartburn, which is typical for him.  Her colleagues have arranged for outpatient follow-up, patient appropriate for discharge.   Carmin Muskrat, MD 03/21/22 [phone]

## 2022-03-21 NOTE — ED Notes (Signed)
Patient resting in bed comfortably at this time. Requesting help with phone. Denies any other needs at this time.

## 2022-03-22 LAB — HEPATITIS B SURFACE ANTIBODY, QUANTITATIVE: Hep B S AB Quant (Post): 171.9 m[IU]/mL (ref >9.9)

## 2022-03-27 ENCOUNTER — Telehealth: Payer: Self-pay

## 2022-03-27 NOTE — Telephone Encounter (Signed)
   Telephone encounter was:  Unsuccessful.  03/27/2022 Name: Corey Park MRN: [id number] DOB: 08-11-1970  Unsuccessful outbound call made today to assist with:   pcp  Outreach Attempt:  1st Attempt Unable to [place], [place]  [phone] [address], [place], [place] [address] Phone: [phone] Email: [email or web address]

## 2022-03-28 ENCOUNTER — Telehealth: Payer: Self-pay

## 2022-03-28 NOTE — Telephone Encounter (Signed)
   Telephone encounter was:  Successful.  03/28/2022 Name: Corey Park MRN: [id number] DOB: [birth year]  Corey Park is a 51 y.o. year old male who is a primary care patient of Default, Provider, MD . The community resource team was consulted for assistance with  pcp and caregiver  Care guide performed the following interventions: Patient provided with information about care guide support team and interviewed to confirm resource needs.Patient needs assistance with finding a pcp and needs assistance in his home   Follow Up Plan:  Care guide will follow up with patient by phone over the next day    [place], [place] Management  [phone] [address], [place], [place] [address] Phone: [phone] Email: [email or web address]

## 2022-04-05 ENCOUNTER — Encounter: Payer: Medicare Other | Admitting: Internal Medicine

## 2022-04-06 ENCOUNTER — Telehealth: Payer: Self-pay

## 2022-04-06 NOTE — Telephone Encounter (Signed)
[place], [facility], Care Management  [phone] [address], [place], [place] [address] Phone: [phone] Email: [email or web address]

## 2022-04-16 ENCOUNTER — Emergency Department (HOSPITAL_COMMUNITY): Payer: Medicare Other

## 2022-04-16 ENCOUNTER — Inpatient Hospital Stay (HOSPITAL_COMMUNITY)
Admission: EM | Admit: 2022-04-16 | Discharge: 2022-04-19 | DRG: 871 | Disposition: A | Discharge status: Home / Self Care | Payer: Medicare Other | Attending: Internal Medicine | Admitting: Internal Medicine

## 2022-04-16 ENCOUNTER — Encounter (HOSPITAL_COMMUNITY): Payer: Self-pay

## 2022-04-16 ENCOUNTER — Other Ambulatory Visit: Payer: Self-pay

## 2022-04-16 DIAGNOSIS — Z91199 Patient's noncompliance with other medical treatment and regimen due to unspecified reason: Secondary | ICD-10-CM

## 2022-04-16 DIAGNOSIS — J45909 Unspecified asthma, uncomplicated: Secondary | ICD-10-CM | POA: Diagnosis present

## 2022-04-16 DIAGNOSIS — E1151 Type 2 diabetes mellitus with diabetic peripheral angiopathy without gangrene: Secondary | ICD-10-CM | POA: Diagnosis present

## 2022-04-16 DIAGNOSIS — E119 Type 2 diabetes mellitus without complications: Secondary | ICD-10-CM

## 2022-04-16 DIAGNOSIS — I160 Hypertensive urgency: Secondary | ICD-10-CM | POA: Diagnosis present

## 2022-04-16 DIAGNOSIS — R066 Hiccough: Secondary | ICD-10-CM | POA: Diagnosis not present

## 2022-04-16 DIAGNOSIS — I43 Cardiomyopathy in diseases classified elsewhere: Secondary | ICD-10-CM | POA: Diagnosis present

## 2022-04-16 DIAGNOSIS — J189 Pneumonia, unspecified organism: Secondary | ICD-10-CM | POA: Diagnosis present

## 2022-04-16 DIAGNOSIS — Z79899 Other long term (current) drug therapy: Secondary | ICD-10-CM

## 2022-04-16 DIAGNOSIS — R7989 Other specified abnormal findings of blood chemistry: Secondary | ICD-10-CM | POA: Diagnosis present

## 2022-04-16 DIAGNOSIS — M7989 Other specified soft tissue disorders: Secondary | ICD-10-CM | POA: Diagnosis present

## 2022-04-16 DIAGNOSIS — Z5986 Financial insecurity: Secondary | ICD-10-CM

## 2022-04-16 DIAGNOSIS — G8929 Other chronic pain: Secondary | ICD-10-CM | POA: Diagnosis present

## 2022-04-16 DIAGNOSIS — D631 Anemia in chronic kidney disease: Secondary | ICD-10-CM | POA: Diagnosis present

## 2022-04-16 DIAGNOSIS — A419 Sepsis, unspecified organism: Principal | ICD-10-CM | POA: Diagnosis present

## 2022-04-16 DIAGNOSIS — Z8616 Personal history of COVID-19: Secondary | ICD-10-CM

## 2022-04-16 DIAGNOSIS — R112 Nausea with vomiting, unspecified: Secondary | ICD-10-CM | POA: Diagnosis not present

## 2022-04-16 DIAGNOSIS — H548 Legal blindness, as defined in USA: Secondary | ICD-10-CM | POA: Diagnosis present

## 2022-04-16 DIAGNOSIS — Z1152 Encounter for screening for COVID-19: Secondary | ICD-10-CM

## 2022-04-16 DIAGNOSIS — N186 End stage renal disease: Secondary | ICD-10-CM

## 2022-04-16 DIAGNOSIS — K640 First degree hemorrhoids: Secondary | ICD-10-CM | POA: Diagnosis present

## 2022-04-16 DIAGNOSIS — K219 Gastro-esophageal reflux disease without esophagitis: Secondary | ICD-10-CM | POA: Diagnosis present

## 2022-04-16 DIAGNOSIS — R0602 Shortness of breath: Secondary | ICD-10-CM

## 2022-04-16 DIAGNOSIS — I272 Pulmonary hypertension, unspecified: Secondary | ICD-10-CM | POA: Diagnosis present

## 2022-04-16 DIAGNOSIS — R072 Precordial pain: Secondary | ICD-10-CM | POA: Diagnosis present

## 2022-04-16 DIAGNOSIS — I5022 Chronic systolic (congestive) heart failure: Secondary | ICD-10-CM | POA: Diagnosis present

## 2022-04-16 DIAGNOSIS — I132 Hypertensive heart and chronic kidney disease with heart failure and with stage 5 chronic kidney disease, or end stage renal disease: Secondary | ICD-10-CM | POA: Diagnosis present

## 2022-04-16 DIAGNOSIS — I420 Dilated cardiomyopathy: Secondary | ICD-10-CM | POA: Diagnosis present

## 2022-04-16 DIAGNOSIS — R079 Chest pain, unspecified: Secondary | ICD-10-CM

## 2022-04-16 DIAGNOSIS — D696 Thrombocytopenia, unspecified: Secondary | ICD-10-CM | POA: Diagnosis present

## 2022-04-16 DIAGNOSIS — K2970 Gastritis, unspecified, without bleeding: Secondary | ICD-10-CM | POA: Diagnosis present

## 2022-04-16 DIAGNOSIS — N2581 Secondary hyperparathyroidism of renal origin: Secondary | ICD-10-CM | POA: Diagnosis present

## 2022-04-16 DIAGNOSIS — Z8249 Family history of ischemic heart disease and other diseases of the circulatory system: Secondary | ICD-10-CM

## 2022-04-16 DIAGNOSIS — D62 Acute posthemorrhagic anemia: Secondary | ICD-10-CM | POA: Diagnosis present

## 2022-04-16 DIAGNOSIS — E1122 Type 2 diabetes mellitus with diabetic chronic kidney disease: Secondary | ICD-10-CM | POA: Diagnosis present

## 2022-04-16 DIAGNOSIS — Z8719 Personal history of other diseases of the digestive system: Secondary | ICD-10-CM

## 2022-04-16 DIAGNOSIS — D1779 Benign lipomatous neoplasm of other sites: Secondary | ICD-10-CM | POA: Diagnosis present

## 2022-04-16 DIAGNOSIS — K5731 Diverticulosis of large intestine without perforation or abscess with bleeding: Secondary | ICD-10-CM | POA: Diagnosis present

## 2022-04-16 DIAGNOSIS — I5023 Acute on chronic systolic (congestive) heart failure: Secondary | ICD-10-CM | POA: Diagnosis present

## 2022-04-16 DIAGNOSIS — R0789 Other chest pain: Secondary | ICD-10-CM | POA: Diagnosis present

## 2022-04-16 DIAGNOSIS — Z992 Dependence on renal dialysis: Secondary | ICD-10-CM

## 2022-04-16 DIAGNOSIS — Z86711 Personal history of pulmonary embolism: Secondary | ICD-10-CM

## 2022-04-16 LAB — I-STAT VENOUS BLOOD GAS, ED
Acid-Base Excess: 0 mmol/L (ref 0.0–2.0)
Bicarbonate: 24.4 mmol/L (ref 20.0–28.0)
Calcium, Ion: 1.2 mmol/L (ref 1.15–1.40)
HCT: 27 % — ABNORMAL LOW (ref 39.0–52.0)
Hemoglobin: 9.2 g/dL — ABNORMAL LOW (ref 13.0–17.0)
O2 Saturation: 73 %
Potassium: 3.8 mmol/L (ref 3.5–5.1)
Sodium: 137 mmol/L (ref 135–145)
TCO2: 26 mmol/L (ref 22–32)
pCO2, Ven: 36.6 mmHg — ABNORMAL LOW (ref 44–60)
pH, Ven: 7.432 — ABNORMAL HIGH (ref 7.25–7.43)
pO2, Ven: 37 mmHg (ref 32–45)

## 2022-04-16 LAB — CBC WITH DIFFERENTIAL/PLATELET
Abs Immature Granulocytes: 0.03 10*3/uL (ref 0.00–0.07)
Basophils Absolute: 0 10*3/uL (ref 0.0–0.1)
Basophils Relative: 0 %
Eosinophils Absolute: 0.1 10*3/uL (ref 0.0–0.5)
Eosinophils Relative: 1 %
HCT: 27.5 % — ABNORMAL LOW (ref 39.0–52.0)
Hemoglobin: 9.5 g/dL — ABNORMAL LOW (ref 13.0–17.0)
Immature Granulocytes: 0 %
Lymphocytes Relative: 9 %
Lymphs Abs: 0.9 10*3/uL (ref 0.7–4.0)
MCH: 33 pg (ref 26.0–34.0)
MCHC: 34.5 g/dL (ref 30.0–36.0)
MCV: 95.5 fL (ref 80.0–100.0)
Monocytes Absolute: 0.8 10*3/uL (ref 0.1–1.0)
Monocytes Relative: 8 %
Neutro Abs: 8.1 10*3/uL — ABNORMAL HIGH (ref 1.7–7.7)
Neutrophils Relative %: 82 %
Platelets: 138 10*3/uL — ABNORMAL LOW (ref 150–400)
RBC: 2.88 MIL/uL — ABNORMAL LOW (ref 4.22–5.81)
RDW: 13.9 % (ref 11.5–15.5)
WBC: 10 10*3/uL (ref 4.0–10.5)
nRBC: 0 % (ref 0.0–0.2)

## 2022-04-16 LAB — COMPREHENSIVE METABOLIC PANEL
ALT: 13 U/L (ref 0–44)
AST: 18 U/L (ref 15–41)
Albumin: 3.3 g/dL — ABNORMAL LOW (ref 3.5–5.0)
Alkaline Phosphatase: 69 U/L (ref 38–126)
Anion gap: 10 (ref 5–15)
BUN: 41 mg/dL — ABNORMAL HIGH (ref 6–20)
CO2: 23 mmol/L (ref 22–32)
Calcium: 9.1 mg/dL (ref 8.9–10.3)
Chloride: 102 mmol/L (ref 98–111)
Creatinine, Ser: 10.72 mg/dL — ABNORMAL HIGH (ref 0.61–1.24)
GFR, Estimated: 5 mL/min — ABNORMAL LOW (ref >60)
Glucose, Bld: 115 mg/dL — ABNORMAL HIGH (ref 70–99)
Potassium: 3.6 mmol/L (ref 3.5–5.1)
Sodium: 135 mmol/L (ref 135–145)
Total Bilirubin: 0.6 mg/dL (ref 0.3–1.2)
Total Protein: 7.1 g/dL (ref 6.5–8.1)

## 2022-04-16 LAB — I-STAT CHEM 8, ED
BUN: 43 mg/dL — ABNORMAL HIGH (ref 6–20)
Calcium, Ion: 1.19 mmol/L (ref 1.15–1.40)
Chloride: 100 mmol/L (ref 98–111)
Creatinine, Ser: 11.8 mg/dL — ABNORMAL HIGH (ref 0.61–1.24)
Glucose, Bld: 113 mg/dL — ABNORMAL HIGH (ref 70–99)
HCT: 29 % — ABNORMAL LOW (ref 39.0–52.0)
Hemoglobin: 9.9 g/dL — ABNORMAL LOW (ref 13.0–17.0)
Potassium: 3.8 mmol/L (ref 3.5–5.1)
Sodium: 137 mmol/L (ref 135–145)
TCO2: 24 mmol/L (ref 22–32)

## 2022-04-16 LAB — TROPONIN I (HIGH SENSITIVITY)
Troponin I (High Sensitivity): 94 ng/L — ABNORMAL HIGH (ref <18)
Troponin I (High Sensitivity): 96 ng/L — ABNORMAL HIGH (ref <18)

## 2022-04-16 LAB — BRAIN NATRIURETIC PEPTIDE: B Natriuretic Peptide: 957.7 pg/mL — ABNORMAL HIGH (ref 0.0–100.0)

## 2022-04-16 LAB — LIPASE, BLOOD: Lipase: 50 U/L (ref 11–51)

## 2022-04-16 MED ORDER — IOHEXOL 350 MG/ML SOLN
80.0000 mL | Freq: Once | INTRAVENOUS | Status: AC | PRN
  Administered 2022-04-16: 80 mL via INTRAVENOUS

## 2022-04-16 MED ORDER — HYDROMORPHONE HCL 1 MG/ML IJ SOLN
0.5000 mg | Freq: Once | INTRAMUSCULAR | Status: AC
  Administered 2022-04-16: 0.5 mg via INTRAVENOUS
  Filled 2022-04-16: qty 1

## 2022-04-16 MED ORDER — CHLORHEXIDINE GLUCONATE CLOTH 2 % EX PADS: 6.0000 | MEDICATED_PAD | Freq: Every day | CUTANEOUS | Status: DC

## 2022-04-16 NOTE — Progress Notes (Signed)
Nephrology Obs note. 51 y/o male esrd on HD p/w CP and SOB. CT r/o PE, CXR with some vascular congestion. Cutting rx short outpatient and left above his DW. Per ER, pt is still sob and tachycardic.   OP HD orders: [facility], MWF, 4 hours, 2lk 2ca, EDW 98.5 kg, RUE AVF Last 3 treatment shorten to 2.5 hours. Last HD on 10/20, left 1.3 kg above his DW.   Plan: HD tonight with goal UF to DW.  Patient will return to ER after HD for reassessment before discharge to home.  D/w dialysis nurse and ER provider.   Full consult to follow if admitted.   D. Aleene Swanner CKA.

## 2022-04-16 NOTE — ED Notes (Signed)
Patient screamed out for help.  When entering the room patient demanding pain meds for his chest pain, explained I was just in the room and he was asleep and he continues to state "I'm blind".  Explained I understand that but after the pain mediction he was asleep so I did not disturb him.  MD notified of patients complaints.  Patient requesting water advised he could not have anything until the MD says he can.

## 2022-04-16 NOTE — ED Provider Notes (Signed)
  Physical Exam  BP (!) 181/92   Pulse 93   Temp 99.6 F (37.6 C) (Oral)   Resp (!) 21   Ht '6\' 1"'$  (1.854 m)   Wt 97.5 kg   SpO2 100%   BMI 28.37 kg/m   Physical Exam  Procedures  Procedures  ED Course / MDM    Medical Decision Making Amount and/or Complexity of Data Reviewed Labs: ordered. Radiology: ordered.  Risk Prescription drug management.   ***

## 2022-04-16 NOTE — ED Notes (Addendum)
Patient verbally abusive with RN when he answered his call light .

## 2022-04-16 NOTE — ED Triage Notes (Signed)
Pt BIB by [facility] EMS with c/o CP that radiates to the R arm. Pt had just got home from church. Pt rated his CP a 8/10 with EMS. Pt was given nitro en route with no relief from the chest pain. Pt is a dialysis pt and has not missed any appts. Pt also stated that he thinks his GI bleed is back .   EMS VS  BP: 188/104 P: 82 R: 20 O2: 99% RA

## 2022-04-16 NOTE — ED Notes (Signed)
Pt stated he thinks his GI bleed is back that he has been dealing with it on and off for months. This RN asked pt what made him think he had a GI bleed. Pt became extremely rude and agitated and started huffing and puffing and stated "because I have blood in my stool." This RN asked pt if he was seeing bright red blood or black tarry stools? Pt huffed again and stated 'I am blind I can't see." This RN asked pt how he knew it was blood if possibly a family member saw it and pt stated "I just know because of the acid." Will continue to monitor.

## 2022-04-16 NOTE — ED Provider Notes (Signed)
[place] EMERGENCY DEPARTMENT Provider Note   CSN: [id number] Arrival date & time: 04/16/22  1400     History  Chief Complaint  Patient presents with   Chest Pain    Corey Park is a 51 y.o. male.  HPI Patient is complex medical history including ESRD on dialysis, diabetes and hypertension.  Patient reports he felt fairly well yesterday.  He went to his dialysis on Friday and had a normal session.  He reports this morning before going to church he had a very mild chest pain and did not feel too unwell.  He reports however after church she got a very severe chest pain in his central and right sided mid chest.  Deep aching in quality.  He reports it is radiating into the right arm.  He also feels short of breath and it hurts to take a deep breath.  Patient reports prior history of GI bleed and wonders if his GI bleed is back.  No hematemesis.    Home Medications Prior to Admission medications   Medication Sig Start Date End Date Taking? Authorizing Provider  Amoxicill-Rifabutin-Omeprazole (TALICIA) 263-78.5-88 MG CPDR Take 4 capsules by mouth every 8 (eight) hours. Take for 14 days 04/04/21   Daryel November, MD  calcium acetate (PHOSLO) 667 MG tablet Take 1,334 mg by mouth 3 (three) times daily.  09/12/19   [provider]  chlorproMAZINE (THORAZINE) 25 MG tablet TAKE 1 TABLET BY MOUTH THREE TIMES DAILY 04/18/21   Daryel November, MD  DIPHENHYDRAMINE HCL PO Take by mouth. 02/07/21 02/06/22  [provider]  IRON SUCROSE IV Inject 1 application as directed every Monday, Wednesday, and Friday. Done at dialysis 11/26/19 04/11/21  [provider]  metoprolol succinate (TOPROL-XL) 100 MG 24 hr tablet Take 1 tablet (100 mg total) by mouth daily. Take with or immediately following a meal. 04/11/21   Jerline Pain, MD  multivitamin (RENA-VIT) TABS tablet Take 1 tablet by mouth daily. 08/29/19   [provider]  ondansetron (ZOFRAN  ODT) 4 MG disintegrating tablet Take 1 tablet (4 mg total) by mouth every 8 (eight) hours as needed for nausea or vomiting. 03/08/20   Deno Etienne, DO  pantoprazole (PROTONIX) 40 MG tablet Take 1 tablet (40 mg total) by mouth daily. 12/10/20   Loel Dubonnet, NP      Allergies    Patient has no known allergies.    Review of Systems   Review of Systems  Physical Exam Updated Vital Signs BP (!) 157/93   Pulse 87   Temp 99.6 F (37.6 C) (Oral)   Resp (!) 29   Ht '6\' 1"'$  (1.854 m)   Wt 97.5 kg   SpO2 100%   BMI 28.37 kg/m  Physical Exam Constitutional:      Comments: Patient is alert.  He is reporting significant right-sided chest pain and uncomfortable in appearance.  It objectively no respiratory distress.  HENT:     Head: Normocephalic and atraumatic.     Mouth/Throat:     Pharynx: Oropharynx is clear.  Eyes:     Extraocular Movements: Extraocular movements intact.  Cardiovascular:     Rate and Rhythm: Normal rate and regular rhythm.  Pulmonary:     Comments: Lungs grossly clear.  No asymmetric breath sounds. Abdominal:     Comments: Abdomen nondistended.  Patient endorses some discomfort in the epigastrium no guarding or appreciable mass.  No appreciable hepatomegaly.  Musculoskeletal:     Comments:  No significant peripheral edema.  Trace pretibial edema.  Calves are soft and nontender.  Skin:    General: Skin is warm and dry.  Neurological:     General: No focal deficit present.     Mental Status: He is oriented to person, place, and time.     ED Results / Procedures / Treatments   Labs (all labs ordered are listed, but only abnormal results are displayed) Labs Reviewed  COMPREHENSIVE METABOLIC PANEL - Abnormal; Notable for the following components:      Result Value   Glucose, Bld 115 (*)    BUN 41 (*)    Creatinine, Ser 10.72 (*)    Albumin 3.3 (*)    GFR, Estimated 5 (*)    All other components within normal limits  BRAIN NATRIURETIC PEPTIDE - Abnormal;  Notable for the following components:   B Natriuretic Peptide 957.7 (*)    All other components within normal limits  CBC WITH DIFFERENTIAL/PLATELET - Abnormal; Notable for the following components:   RBC 2.88 (*)    Hemoglobin 9.5 (*)    HCT 27.5 (*)    Platelets 138 (*)    Neutro Abs 8.1 (*)    All other components within normal limits  I-STAT VENOUS BLOOD GAS, ED - Abnormal; Notable for the following components:   pH, Ven 7.432 (*)    pCO2, Ven 36.6 (*)    HCT 27.0 (*)    Hemoglobin 9.2 (*)    All other components within normal limits  I-STAT CHEM 8, ED - Abnormal; Notable for the following components:   BUN 43 (*)    Creatinine, Ser 11.80 (*)    Glucose, Bld 113 (*)    Hemoglobin 9.9 (*)    HCT 29.0 (*)    All other components within normal limits  TROPONIN I (HIGH SENSITIVITY) - Abnormal; Notable for the following components:   Troponin I (High Sensitivity) 94 (*)    All other components within normal limits  LIPASE, BLOOD  TROPONIN I (HIGH SENSITIVITY)    EKG EKG Interpretation  Date/Time:  Sunday April 16 2022 14:08:18 EDT Ventricular Rate:  85 PR Interval:  194 QRS Duration: 111 QT Interval:  393 QTC Calculation: 468 R Axis:   -8 Text Interpretation: Sinus rhythm LVH with secondary repolarization abnormality Anterior ST elevation, probably due to LVH agree, T wave peak and repolarization slight more than previous. inferior twave inversion and ST depression new since first previous.  Possible ischemia but no STEMI Confirmed by Charlesetta Shanks [phone]) on 04/16/2022 4:37:06 PM  Radiology DG Chest Port 1 View  Result Date: 04/16/2022 CLINICAL DATA:  Chest pain. EXAM: PORTABLE CHEST 1 VIEW COMPARISON:  March 20, 2022. FINDINGS: Mild cardiomegaly is noted with mild central pulmonary vascular congestion. Minimal bibasilar subsegmental atelectasis or edema is noted. Bony thorax is unremarkable. IMPRESSION: Mild cardiomegaly with mild central pulmonary vascular  congestion. Minimal bibasilar subsegmental atelectasis or edema is noted. Electronically Signed   By: Marijo Conception M.D.   On: 04/16/2022 16:09    Procedures Procedures    Medications Ordered in ED Medications  HYDROmorphone (DILAUDID) injection 0.5 mg (0.5 mg Intravenous Given 04/16/22 1518)    ED Course/ Medical Decision Making/ A&P Clinical Course as of 04/16/22 1639  Sun Apr 16, 6760  9509  systolic HF 32%. COVID-19 hospitalization 08/03/19, ESRD on HD, chronic systolic heart failure, hypertensive cardiomyopathy, moderate secondary pulmonary hypertension, blindness, DM2, HTN  [JN]  1628 Laboratory work-up reassuring, hemoglobin 9.5 at  his baseline.  No significant leukocytosis.  CMP revealed a creatinine of 10.7, also the patient's baseline, no additional significant metabolic abnormalities.  Lipase 50.  I-STAT venous blood gas with pH 7.43, CO2 36.6.  Initial troponin 94, similar to patient's prior studies.  BNP elevated 957 [JN]    Clinical Course User Index [JN] Nogle, [place], MD                           Medical Decision Making Amount and/or Complexity of Data Reviewed Labs: ordered. Radiology: ordered.  Risk Prescription drug management.   Patient significant comorbid conditions and past medical history concerning for life threatening causes of chest pain and dyspnea.  Differential diagnosis includes ACS\pneumonia\congestive heart failure\aortic dissection\GI etiology such as peptic ulcer disease. We will proceed with diagnostic evaluation including chest x-ray imaging, labs and EKGs.  Patient given Dilaudid half milligram IV for pain control.  I have reviewed EKGs personally.  Patient had early repolarization present on prior EKGs.  Somewhat more pronounced with T wave peaking on current EKGs.  Potential ischemic appearance but no STEMI pattern.  Also reviewed the EKG transmitted by EMS with the same appearance.  Reviewed the patient's portable chest x-ray at bedside.   Mild vascular congestion present.  No pneumothorax or focal consolidation.  No significant mediastinal widening.  At shift change, case reviewed with Dr. Reather Converse and Dr. Oswaldo Milian.  We will follow-up on results and continue assessment and management for disposition planning.        Final Clinical Impression(s) / ED Diagnoses Final diagnoses:  Nonspecific chest pain  Shortness of breath  ESRD on dialysis [facility])    Rx / DC Orders ED Discharge Orders     None         Charlesetta Shanks, MD 04/16/22 1644

## 2022-04-17 ENCOUNTER — Inpatient Hospital Stay (HOSPITAL_COMMUNITY): Payer: Medicare Other

## 2022-04-17 DIAGNOSIS — J189 Pneumonia, unspecified organism: Secondary | ICD-10-CM | POA: Diagnosis present

## 2022-04-17 DIAGNOSIS — D62 Acute posthemorrhagic anemia: Secondary | ICD-10-CM | POA: Diagnosis present

## 2022-04-17 DIAGNOSIS — J45909 Unspecified asthma, uncomplicated: Secondary | ICD-10-CM | POA: Diagnosis present

## 2022-04-17 DIAGNOSIS — I160 Hypertensive urgency: Secondary | ICD-10-CM | POA: Diagnosis present

## 2022-04-17 DIAGNOSIS — I5022 Chronic systolic (congestive) heart failure: Secondary | ICD-10-CM

## 2022-04-17 DIAGNOSIS — I420 Dilated cardiomyopathy: Secondary | ICD-10-CM | POA: Diagnosis present

## 2022-04-17 DIAGNOSIS — N2581 Secondary hyperparathyroidism of renal origin: Secondary | ICD-10-CM | POA: Diagnosis present

## 2022-04-17 DIAGNOSIS — I5023 Acute on chronic systolic (congestive) heart failure: Secondary | ICD-10-CM | POA: Diagnosis present

## 2022-04-17 DIAGNOSIS — R066 Hiccough: Secondary | ICD-10-CM | POA: Diagnosis not present

## 2022-04-17 DIAGNOSIS — Z8616 Personal history of COVID-19: Secondary | ICD-10-CM | POA: Diagnosis not present

## 2022-04-17 DIAGNOSIS — Z1152 Encounter for screening for COVID-19: Secondary | ICD-10-CM | POA: Diagnosis not present

## 2022-04-17 DIAGNOSIS — I272 Pulmonary hypertension, unspecified: Secondary | ICD-10-CM | POA: Diagnosis present

## 2022-04-17 DIAGNOSIS — R079 Chest pain, unspecified: Secondary | ICD-10-CM

## 2022-04-17 DIAGNOSIS — K5731 Diverticulosis of large intestine without perforation or abscess with bleeding: Secondary | ICD-10-CM | POA: Diagnosis present

## 2022-04-17 DIAGNOSIS — A419 Sepsis, unspecified organism: Secondary | ICD-10-CM | POA: Diagnosis present

## 2022-04-17 DIAGNOSIS — D631 Anemia in chronic kidney disease: Secondary | ICD-10-CM | POA: Diagnosis present

## 2022-04-17 DIAGNOSIS — I132 Hypertensive heart and chronic kidney disease with heart failure and with stage 5 chronic kidney disease, or end stage renal disease: Secondary | ICD-10-CM | POA: Diagnosis present

## 2022-04-17 DIAGNOSIS — Z79899 Other long term (current) drug therapy: Secondary | ICD-10-CM | POA: Diagnosis not present

## 2022-04-17 DIAGNOSIS — E1122 Type 2 diabetes mellitus with diabetic chronic kidney disease: Secondary | ICD-10-CM | POA: Diagnosis present

## 2022-04-17 DIAGNOSIS — R809 Proteinuria, unspecified: Secondary | ICD-10-CM

## 2022-04-17 DIAGNOSIS — Z8719 Personal history of other diseases of the digestive system: Secondary | ICD-10-CM

## 2022-04-17 DIAGNOSIS — N186 End stage renal disease: Secondary | ICD-10-CM | POA: Diagnosis present

## 2022-04-17 DIAGNOSIS — R7989 Other specified abnormal findings of blood chemistry: Secondary | ICD-10-CM

## 2022-04-17 DIAGNOSIS — R0789 Other chest pain: Secondary | ICD-10-CM | POA: Diagnosis present

## 2022-04-17 DIAGNOSIS — Z992 Dependence on renal dialysis: Secondary | ICD-10-CM | POA: Diagnosis not present

## 2022-04-17 DIAGNOSIS — I10 Essential (primary) hypertension: Secondary | ICD-10-CM | POA: Insufficient documentation

## 2022-04-17 DIAGNOSIS — D1779 Benign lipomatous neoplasm of other sites: Secondary | ICD-10-CM | POA: Diagnosis present

## 2022-04-17 DIAGNOSIS — D696 Thrombocytopenia, unspecified: Secondary | ICD-10-CM | POA: Diagnosis present

## 2022-04-17 DIAGNOSIS — E1129 Type 2 diabetes mellitus with other diabetic kidney complication: Secondary | ICD-10-CM

## 2022-04-17 DIAGNOSIS — K219 Gastro-esophageal reflux disease without esophagitis: Secondary | ICD-10-CM | POA: Diagnosis present

## 2022-04-17 DIAGNOSIS — H548 Legal blindness, as defined in USA: Secondary | ICD-10-CM | POA: Diagnosis present

## 2022-04-17 DIAGNOSIS — R112 Nausea with vomiting, unspecified: Secondary | ICD-10-CM | POA: Diagnosis not present

## 2022-04-17 DIAGNOSIS — G8929 Other chronic pain: Secondary | ICD-10-CM | POA: Diagnosis present

## 2022-04-17 DIAGNOSIS — E1151 Type 2 diabetes mellitus with diabetic peripheral angiopathy without gangrene: Secondary | ICD-10-CM | POA: Diagnosis present

## 2022-04-17 DIAGNOSIS — I43 Cardiomyopathy in diseases classified elsewhere: Secondary | ICD-10-CM | POA: Diagnosis present

## 2022-04-17 HISTORY — DX: Sepsis, unspecified organism: A41.9

## 2022-04-17 HISTORY — DX: Acute posthemorrhagic anemia: D62

## 2022-04-17 LAB — URINALYSIS, ROUTINE W REFLEX MICROSCOPIC
Bilirubin Urine: NEGATIVE
Glucose, UA: 50 mg/dL — AB
Ketones, ur: NEGATIVE mg/dL
Nitrite: NEGATIVE
Protein, ur: 100 mg/dL — AB
Specific Gravity, Urine: 1.015 (ref 1.005–1.030)
pH: 7 (ref 5.0–8.0)

## 2022-04-17 LAB — HEMOGLOBIN AND HEMATOCRIT, BLOOD
HCT: 27.8 % — ABNORMAL LOW (ref 39.0–52.0)
Hemoglobin: 9.5 g/dL — ABNORMAL LOW (ref 13.0–17.0)

## 2022-04-17 LAB — LACTIC ACID, PLASMA: Lactic Acid, Venous: 0.8 mmol/L (ref 0.5–1.9)

## 2022-04-17 LAB — CBC WITH DIFFERENTIAL/PLATELET
Abs Immature Granulocytes: 0.03 10*3/uL (ref 0.00–0.07)
Basophils Absolute: 0 10*3/uL (ref 0.0–0.1)
Basophils Relative: 0 %
Eosinophils Absolute: 0.2 10*3/uL (ref 0.0–0.5)
Eosinophils Relative: 2 %
HCT: 26.2 % — ABNORMAL LOW (ref 39.0–52.0)
Hemoglobin: 8.9 g/dL — ABNORMAL LOW (ref 13.0–17.0)
Immature Granulocytes: 0 %
Lymphocytes Relative: 16 %
Lymphs Abs: 1.2 10*3/uL (ref 0.7–4.0)
MCH: 32.6 pg (ref 26.0–34.0)
MCHC: 34 g/dL (ref 30.0–36.0)
MCV: 96 fL (ref 80.0–100.0)
Monocytes Absolute: 0.8 10*3/uL (ref 0.1–1.0)
Monocytes Relative: 11 %
Neutro Abs: 5.2 10*3/uL (ref 1.7–7.7)
Neutrophils Relative %: 71 %
Platelets: 121 10*3/uL — ABNORMAL LOW (ref 150–400)
RBC: 2.73 MIL/uL — ABNORMAL LOW (ref 4.22–5.81)
RDW: 14 % (ref 11.5–15.5)
WBC: 7.4 10*3/uL (ref 4.0–10.5)
nRBC: 0 % (ref 0.0–0.2)

## 2022-04-17 LAB — PROCALCITONIN: Procalcitonin: 1.27 ng/mL

## 2022-04-17 LAB — RENAL FUNCTION PANEL
Albumin: 3.1 g/dL — ABNORMAL LOW (ref 3.5–5.0)
Anion gap: 11 (ref 5–15)
BUN: 25 mg/dL — ABNORMAL HIGH (ref 6–20)
CO2: 28 mmol/L (ref 22–32)
Calcium: 8.7 mg/dL — ABNORMAL LOW (ref 8.9–10.3)
Chloride: 97 mmol/L — ABNORMAL LOW (ref 98–111)
Creatinine, Ser: 7.21 mg/dL — ABNORMAL HIGH (ref 0.61–1.24)
GFR, Estimated: 9 mL/min — ABNORMAL LOW (ref >60)
Glucose, Bld: 110 mg/dL — ABNORMAL HIGH (ref 70–99)
Phosphorus: 2.5 mg/dL (ref 2.5–4.6)
Potassium: 3.3 mmol/L — ABNORMAL LOW (ref 3.5–5.1)
Sodium: 136 mmol/L (ref 135–145)

## 2022-04-17 LAB — ECHOCARDIOGRAM COMPLETE
Area-P 1/2: 4.33 cm2
Height: 73 in
MV VTI: 3.88 cm2
S' Lateral: 3.7 cm
Weight: 3440 oz

## 2022-04-17 LAB — TYPE AND SCREEN
ABO/RH(D): O POS
Antibody Screen: NEGATIVE

## 2022-04-17 LAB — POC OCCULT BLOOD, ED: Fecal Occult Bld: NEGATIVE

## 2022-04-17 LAB — RESP PANEL BY RT-PCR (FLU A&B, COVID) ARPGX2
Influenza A by PCR: NEGATIVE
Influenza B by PCR: NEGATIVE
SARS Coronavirus 2 by RT PCR: NEGATIVE

## 2022-04-17 LAB — HIV ANTIBODY (ROUTINE TESTING W REFLEX): HIV Screen 4th Generation wRfx: NONREACTIVE

## 2022-04-17 LAB — HEPATITIS B SURFACE ANTIGEN: Hepatitis B Surface Ag: NONREACTIVE

## 2022-04-17 MED ORDER — POTASSIUM CHLORIDE CRYS ER 20 MEQ PO TBCR
20.0000 meq | EXTENDED_RELEASE_TABLET | ORAL | Status: AC
  Administered 2022-04-17: 20 meq via ORAL
  Filled 2022-04-17: qty 1

## 2022-04-17 MED ORDER — ONDANSETRON HCL 4 MG PO TABS: 4.0000 mg | ORAL_TABLET | Freq: Four times a day (QID) | ORAL | Status: DC | PRN

## 2022-04-17 MED ORDER — GABAPENTIN 100 MG PO CAPS
100.0000 mg | ORAL_CAPSULE | Freq: Three times a day (TID) | ORAL | Status: DC | PRN
  Administered 2022-04-19: 100 mg via ORAL
  Filled 2022-04-17: qty 1

## 2022-04-17 MED ORDER — SODIUM CHLORIDE 0.9 % IV SOLN
500.0000 mg | Freq: Once | INTRAVENOUS | Status: AC
  Filled 2022-04-17: qty 5

## 2022-04-17 MED ORDER — PANTOPRAZOLE SODIUM 20 MG PO TBEC: 20.0000 mg | DELAYED_RELEASE_TABLET | Freq: Every day | ORAL | Status: DC | PRN

## 2022-04-17 MED ORDER — SODIUM CHLORIDE 0.9 % IV SOLN
2.0000 g | INTRAVENOUS | Status: DC
  Administered 2022-04-17 – 2022-04-18 (×2): 2 g via INTRAVENOUS
  Filled 2022-04-17 (×3): qty 20

## 2022-04-17 MED ORDER — RENA-VITE PO TABS
1.0000 | ORAL_TABLET | Freq: Every day | ORAL | Status: DC
  Administered 2022-04-17 – 2022-04-18 (×2): 1 via ORAL
  Filled 2022-04-17 (×4): qty 1

## 2022-04-17 MED ORDER — DIPHENHYDRAMINE HCL 25 MG PO CAPS
25.0000 mg | ORAL_CAPSULE | Freq: Every evening | ORAL | Status: DC | PRN
  Administered 2022-04-19: 25 mg via ORAL
  Filled 2022-04-17 (×2): qty 1

## 2022-04-17 MED ORDER — LIDOCAINE VISCOUS HCL 2 % MT SOLN
15.0000 mL | Freq: Once | OROMUCOSAL | Status: AC
  Administered 2022-04-17: 15 mL via ORAL
  Filled 2022-04-17: qty 15

## 2022-04-17 MED ORDER — GUAIFENESIN ER 600 MG PO TB12
600.0000 mg | ORAL_TABLET | Freq: Two times a day (BID) | ORAL | Status: DC
  Administered 2022-04-17 – 2022-04-19 (×5): 600 mg via ORAL
  Filled 2022-04-17 (×5): qty 1

## 2022-04-17 MED ORDER — PANTOPRAZOLE SODIUM 40 MG PO TBEC
40.0000 mg | DELAYED_RELEASE_TABLET | Freq: Two times a day (BID) | ORAL | Status: DC
  Administered 2022-04-17 – 2022-04-19 (×5): 40 mg via ORAL
  Filled 2022-04-17 (×5): qty 1

## 2022-04-17 MED ORDER — ONDANSETRON HCL 4 MG/2ML IJ SOLN: 4.0000 mg | Freq: Four times a day (QID) | INTRAMUSCULAR | Status: DC | PRN

## 2022-04-17 MED ORDER — ACETAMINOPHEN 500 MG PO TABS
1000.0000 mg | ORAL_TABLET | Freq: Once | ORAL | Status: AC
  Administered 2022-04-17: 1000 mg via ORAL
  Filled 2022-04-17: qty 2

## 2022-04-17 MED ORDER — CARVEDILOL 25 MG PO TABS
25.0000 mg | ORAL_TABLET | Freq: Two times a day (BID) | ORAL | Status: DC
  Administered 2022-04-17 – 2022-04-18 (×4): 25 mg via ORAL
  Filled 2022-04-17 (×2): qty 1
  Filled 2022-04-17: qty 2
  Filled 2022-04-17 (×2): qty 1

## 2022-04-17 MED ORDER — CALCIUM ACETATE (PHOS BINDER) 667 MG PO CAPS
1334.0000 mg | ORAL_CAPSULE | Freq: Three times a day (TID) | ORAL | Status: DC
  Administered 2022-04-17 – 2022-04-19 (×7): 1334 mg via ORAL
  Filled 2022-04-17 (×7): qty 2

## 2022-04-17 MED ORDER — ACETAMINOPHEN 325 MG PO TABS
650.0000 mg | ORAL_TABLET | Freq: Four times a day (QID) | ORAL | Status: DC | PRN
  Administered 2022-04-19 (×2): 650 mg via ORAL
  Filled 2022-04-17 (×2): qty 2

## 2022-04-17 MED ORDER — CALCIUM ACETATE (PHOS BINDER) 667 MG PO TABS: 1334.0000 mg | ORAL_TABLET | Freq: Three times a day (TID) | ORAL | Status: DC

## 2022-04-17 MED ORDER — ACETAMINOPHEN 650 MG RE SUPP: 650.0000 mg | Freq: Four times a day (QID) | RECTAL | Status: DC | PRN

## 2022-04-17 MED ORDER — SODIUM CHLORIDE 0.9 % IV SOLN: 1.0000 g | Freq: Once | INTRAVENOUS | Status: DC

## 2022-04-17 MED ORDER — ALBUTEROL SULFATE (2.5 MG/3ML) 0.083% IN NEBU: 2.5000 mg | INHALATION_SOLUTION | RESPIRATORY_TRACT | Status: DC | PRN

## 2022-04-17 MED ORDER — ALUM & MAG HYDROXIDE-SIMETH 200-200-20 MG/5ML PO SUSP
30.0000 mL | Freq: Once | ORAL | Status: AC
  Administered 2022-04-17: 30 mL via ORAL
  Filled 2022-04-17: qty 30

## 2022-04-17 MED ORDER — SODIUM CHLORIDE 0.9% FLUSH
3.0000 mL | Freq: Two times a day (BID) | INTRAVENOUS | Status: DC
  Administered 2022-04-17 – 2022-04-18 (×4): 3 mL via INTRAVENOUS

## 2022-04-17 NOTE — ED Notes (Signed)
Pt requesting to go home. Notified Tamala Julian MD

## 2022-04-17 NOTE — ED Provider Notes (Signed)
I assumed care of this patient.  Please see previous provider note for further details of Hx, PE.  Briefly patient is a 51 y.o. male who presented chest pain thought to be due to pulmonary edema from volume overload ESRD.  Plan was to has patient sent to dialysis and reassessed.  My arrival, patient was already in dialysis.  Upon completion patient was transported back down to the emergency department where I reevaluated him.  He complained of persistent chest pain.  GI cocktail did not provide any relief.  During that reassessment patient felt warm and was noted to have fever of 101.7.  I reviewed the patient's prior work-up and noted that a CT PE study had been performed that showed bibasilar opacities concerning for atelectasis versus pneumonia.  I obtained a COVID/influenza which were both negative.  Patient was started on empiric antibiotics for community-acquired pneumonia and admitted to medicine for further management.      Fatima Blank, MD 04/17/22 [phone]

## 2022-04-17 NOTE — ED Notes (Signed)
Hooked patient back up to the monitor patient is resting with call bell in reach 

## 2022-04-17 NOTE — ED Notes (Signed)
Rn called microbiology to confirm they received 2 sets of blood culture. Per micro culture is in the lab, but have not been process.

## 2022-04-17 NOTE — Progress Notes (Signed)
  Echocardiogram 2D Echocardiogram has been performed.  Eartha Inch 04/17/2022, 2:30 PM

## 2022-04-17 NOTE — Progress Notes (Signed)
Received patient in STRETCHER to unit.  Alert and oriented.  Informed consent signed and in chart.   Treatment initiated: 2228 Treatment completed: 0254  Patient tolerated well.  Transported back to the room  Alert, without acute distress.  Hand-off given to patient's nurse.   Access used: FISTULA Access issues: NONE  Total UF removed: 3200 ML Medication(s) given: NONE Post HD VS: 98.4 160/78 93 33 100% [place] 2L Post HD weight: UNABLE TO OBTAIN, IS ON STRETCHER   [facility]

## 2022-04-17 NOTE — ED Notes (Signed)
Walked patient to the bathroom patient did well patient is back in bed on the monitor with call bell in reach

## 2022-04-17 NOTE — ED Notes (Signed)
Smith MD at bedside ?

## 2022-04-17 NOTE — H&P (Addendum)
History and Physical    Patient: Corey Park TIW:[id number] DOB: [birth year] DOA: 04/16/2022 DOS: the patient was seen and examined on 04/17/2022 PCP: Default, Provider, MD  Patient coming from: lives alone at home via EMS  Chief Complaint:  Chief Complaint  Patient presents with   Chest Pain   HPI: Corey Park is a 51 y.o. male with medical history significant of hypertension, systolic CHF, PAH, ESRD on HD, diet controlled DM type II, blindness, and history of GI bleed presents with complaints of chest pain.  Symptoms started yesterday morninwith complaints of centralized chest pain with achy crampy pain going into his right shoulder.  Rated pain as 8/10 on the pain scale.  He complained of shortness of breath and reported being unable to take a deep inspiratory breath due to worsening chest pain.  He had last dialyzed on 10/20, but reported that he only dialyzed approximately 2 hours after getting to his appointment late.  On further questioning he also reports feeling like he had blood in his stool due to the bowel movement feeling "acidity".  He complains of some coughing with dialysis today, but prior to that had not had any significant cough.  Denies having any fever prior to today, abdominal pain, dysuria, leg swelling, calf pain, NSAID use, or history of pulmonary embolus.  Over the phone his mother notes that he chronically deals with hiccups, nausea, and vomiting.  Patient states that he has intermittent periods of hiccups which lead to nausea and vomiting that has been present for approximately 4 years now.  Episodes can last a week and then go away for a week prior to reoccurring.  Last episode ended 4 days ago.  Denies any hiccups, nausea, vomiting currently.  Upon admission to the emergency department patient was noted to be febrile up to 101.7 F with heart rate 82-102, respirations 20-43, blood pressures elevated up to 181/92, and O2 saturations currently maintained on room  air.  Venous blood gas did not show any signs of hypercapnia.  Labs from 10/23 noted WBC 10, hemoglobin 9.5, platelets 138, potassium 3.6, BUN 41, creatinine 10.72, BNP 957.7, high-sensitivity troponin 94->96.  CTA of the chest noted low lung volumes with patchy dependent bibasilar airspace opacities concerning for atelectasis and/or pneumonia, mild cardiomegaly, dilated main pulmonary trunk concerning for pulmonary artery hypertension, and no signs of a pulmonary embolus.  Symptoms had initially been thought secondary to patient being fluid overloaded for which he was taken for dialysis with plans to possibly discharge home thereafter.  However, after dialysis developed fever for which he was started on empiric antibiotics of Rocephin and azithromycin with thoughts of possible community-acquired pneumonia.  Influenza and COVID-19 screening were negative.  Review of Systems: As mentioned in the history of present illness. All other systems reviewed and are negative. Past Medical History:  Diagnosis Date   Acute CHF (congestive heart failure) ([place]) 07/27/2019   AKI (acute kidney injury) ([place]) 12/11/2016   Allergy, unspecified, initial encounter 08/25/2019   Anemia 2021   Anesthesia of skin 03/31/2020   Asthma    as a child   Blind    Cellulitis, perineum 11/26/2019   CHF (congestive heart failure) ([place]) 2021   Chronic kidney disease, stage V ([place]) 06/17/2018   Chronic kidney disease, stage V ([place]) 25/10/3974   Complication of vascular dialysis catheter 08/25/2019   COVID-19 virus infection 07/27/2019   Diabetes mellitus without complication ([place])    type 2   Dilated cardiomyopathy ([place]) 07/03/2018   Elevated  troponin    Encounter for removal of sutures 08/04/2020   Fe deficiency anemia 12/23/2018   Fluid overload 12/11/2016   Hypertension    Hypertensive urgency 12/11/2016   Hypomagnesemia 12/13/2016   Legally blind    B/L   Pain, unspecified 10/17/2019   Pneumonia 2021   Renal disorder    dialysis  T-TH-Sat   Shortness of breath 11/10/2019   Symptomatic anemia 12/21/2018   Type 2 diabetes mellitus with diabetic peripheral angiopathy without gangrene ([place]) 08/25/2019   Unspecified protein-calorie malnutrition ([place]) 08/25/2019   Past Surgical History:  Procedure Laterality Date   BASCILIC VEIN TRANSPOSITION Right 10/16/2019   Procedure: Basilic Vein Transposition Right Arm;  Surgeon: Serafina Mitchell, MD;  Location: [place];  Service: Vascular;  Laterality: Right;   [place] Right 01/29/2020   Procedure: RIGHT ARM SECOND STAGE [place];  Surgeon: Serafina Mitchell, MD;  Location: [place];  Service: Vascular;  Laterality: Right;   BIOPSY  12/22/2018   Procedure: BIOPSY;  Surgeon: Laurence Spates, MD;  Location: [facility] ENDOSCOPY;  Service: Endoscopy;;   ESOPHAGOGASTRODUODENOSCOPY N/A 12/22/2018   Procedure: ESOPHAGOGASTRODUODENOSCOPY (EGD);  Surgeon: Laurence Spates, MD;  Location[facility] END[facility]COPY;  Service: Endoscopy;  Laterality: N/A;   IR FLUORO GUIDE CV LINE RIGHT  07/29/2019   IR US GUIDE VASC ACCESS RIGHT  07/29/2019   Social History:  reports that he has never smoked. He has never used smokeless tobacco. He reports that he does not currently use alcohol. He reports that he does not currently use drugs after having used the following drugs: Marijuana.  No Known Allergies  Family History  Problem Relation Age of Onset   CAD Mother    Hypertension Mother    Diabetes Neg Hx    Stroke Neg Hx    Cancer Neg Hx    Kidney failure Neg Hx    Stomach cancer Neg Hx    Colon cancer Neg Hx    Rectal cancer Neg Hx     Prior to Admission medications   Medication Sig Start Date End Date Taking? Authorizing Provider  Amoxicill-Rifabutin-Omeprazole (TALICIA) 696-29.5-28 MG CPDR Take 4 capsules by mouth every 8 (eight) hours. Take for 14 days 04/04/21   Daryel November, MD  calcium acetate (PHOSLO) 667 MG tablet Take 1,334 mg by mouth 3 (three) times daily.  09/12/19    [provider]  chlorproMAZINE (THORAZINE) 25 MG tablet TAKE 1 TABLET BY MOUTH THREE TIMES DAILY Patient taking differently: Take 25 mg by mouth 3 (three) times daily. 04/18/21   Daryel November, MD  DIPHENHYDRAMINE HCL PO Take by mouth. 02/07/21 02/06/22  [provider]  IRON SUCROSE IV Inject 1 application as directed every Monday, Wednesday, and Friday. Done at dialysis 11/26/19 04/11/21  [provider]  metoprolol succinate (TOPROL-XL) 100 MG 24 hr tablet Take 1 tablet (100 mg total) by mouth daily. Take with or immediately following a meal. Patient taking differently: Take 100 mg by mouth daily. 04/11/21   Jerline Pain, MD  multivitamin (RENA-VIT) TABS tablet Take 1 tablet by mouth daily. 08/29/19   [provider]  ondansetron (ZOFRAN ODT) 4 MG disintegrating tablet Take 1 tablet (4 mg total) by mouth every 8 (eight) hours as needed for nausea or vomiting. 03/08/20   Deno Etienne, DO  pantoprazole (PROTONIX) 40 MG tablet Take 1 tablet (40 mg total) by mouth daily. 12/10/20   Loel Dubonnet, NP    Physical Exam: Vitals:  04/17/22 0259 04/17/22 0442 04/17/22 0515 04/17/22 0554  BP: (!) 160/78 (!) 148/88    Pulse: 92 87    Resp: (!) 33 20    Temp: 98.4 F (36.9 C) 99.9 F (37.7 C) (!) 101.7 F (38.7 C) (!) 101.7 F (38.7 C)  TempSrc: Oral Oral Oral Oral  SpO2: 100% 98%    Weight:      Height:        Constitutional: Middle aged male currently in no acute distress Eyes: Opacification noted of the left eye.  Patient legally blind ENMT: Mucous membranes are moist.   Neck: normal, supple.  No JVD noted at this time Respiratory: Decreased inspiratory effort due to complaints of pain.  Noted some rales in the lower lung fields.  No significant wheezes appreciated. Cardiovascular: Regular rate and rhythm, with positive systolic murmur.  No lower extremity edema present at this time.  2+ pedal pulses.  Fistula the right upper extremity with palpable  thrill. Abdomen: no tenderness, no masses palpated.  Bowel sounds positive.  Musculoskeletal: no clubbing / cyanosis. No joint deformity upper and lower extremities. Good ROM, no contractures. Normal muscle tone.  Skin: no rashes, lesions, ulcers. No induration Neurologic: Strength 5/5 in all 4.  Psychiatric: Normal judgment and insight. Alert and oriented x 3. Normal mood.   Data Reviewed:  EKG sinus rhythm 89 bpm with more pronounced ST wave elevation in the anterior leads than prior studies with LVH.  Reviewed labs, imaging, and pertinent records as noted above in HPI.  Assessment and Plan: Sepsis secondary to possible community-acquired pneumonia Acute.  Patient was noted to be febrile up to 101.7 F with tachycardia and tachypnea meeting SIRS criteria.  CTA of the chest noted concern for possible atelectasis and/or pneumonia without signs of a pulmonary embolus.  He had been ordered empiric antibiotics of Rocephin and azithromycin.  Other possible sources of infection include bacteremia given dialysis.  Given reports of intermittent episodes of nausea and vomiting question possibility of aspiration -Admit to a telemetry bed -Nasal cannula oxygen as needed to maintain O2 saturation greater than 92% -Incentive spirometry and flutter valve -Check blood cultures and urinalysis -Check lactic acid and procalcitonin -Continue empiric antibiotics of Rocephin and azithromycin -Mucinex -Tylenol as needed for fever  Acute blood loss anemia history of GI bleed Hemoglobin 9.5 g/dL, but had been 14.3 on 9/25.  He reported feeling like he had a bloody bowel movement yesterday due to being "acidity". Patient previously had a GI bleed back in 11/2018. He underwent EGD by Dr. Candis Schatz which noted gastritis, nodules found in the duodenum which were biopsied, and copious frothy sputum suggestive of underlying esophageal motility disorder.  Colonoscopy noted a few small mouth diverticula in the sigmoid  colon, large lipoma 15 mm in diameter in the transverse colon, and nonbleeding internal hemorrhoids grade 1. -Type and screen for possible need of blood products -Check stool guaiac -Serial monitoring of H&H -Will formally consult gastroenterology if concern for active bleeding  Atypical chest pain elevated troponin  Chronic.  He presented with complaints of substernal chest pain with radiation to the right shoulder.  CT angiogram negative for PE.  High-sensitivity troponin essentially flat 94-> 96.  EKG with more pronounced T wave changes in the anterior leads. -Check Echocardiogram  ESRD on HD Patient normally dialyzes Monday, Wednesday, and Friday.  Last session on 10/20 was shortened due to patient arriving late.  He was dialyzed late yesterday evening and his symptoms were thought to be  related to him being fluid overloaded. -Renal function panel daily -Fluid management with hemodialysis per nephrology  Systolic congestive heart failure Acute on chronic.  Last echocardiogram noted EF of 40 to 45% back in 11/2020.  BNP was elevated at 957.7. -Fluid management with hemodialysis  Hypertensive urgency On admission blood pressures elevated up to 181/92.  Home blood pressure regimen includes Coreg 25 mg twice daily -Continue Coreg -Hydralazine IV as needed for blood pressures  Hiccups nausea and vomiting Chronic.  Patient reports intermittent episodes of hiccups lasting several days with nausea and vomiting which he was told no clear cause has been found.  EGD noted concern for the possibility of esophageal dysmotility. -Likely needs to follow-up with GI for further work-up in the outpatient setting  Thrombocytopenia Acute on chronic.  Platelet count 138 on admission, but appears intermittently low looking at prior records. -Continue to monitor  Diet-controlled diabetes mellitus type 2 Patient is not on any medications for treatment.  Initial blood glucose 110.  Last hemoglobin A1c  6.2 in 11/2019.  GERD -Protonix bid   DVT prophylaxis: SCDs  Advance Care Planning:   Code Status: Full Code   Consults: Nephrology  Family Communication: Patient's mother updated over the phone  Severity of Illness: The appropriate patient status for this patient is INPATIENT. Inpatient status is judged to be reasonable and necessary in order to provide the required intensity of service to ensure the patient's safety. The patient's presenting symptoms, physical exam findings, and initial radiographic and laboratory data in the context of their chronic comorbidities is felt to place them at high risk for further clinical deterioration. Furthermore, it is not anticipated that the patient will be medically stable for discharge from the hospital within 2 midnights of admission.   * I certify that at the point of admission it is my clinical judgment that the patient will require inpatient hospital care spanning beyond 2 midnights from the point of admission due to high intensity of service, high risk for further deterioration and high frequency of surveillance required.*  Author: Norval Morton, MD 04/17/2022 7:15 AM  For on call review [email or web address].

## 2022-04-17 NOTE — ED Notes (Signed)
Educated pt on keeping his arm straight so the IV pump doesn't alarm. Pt did not respond to education as to whether he understood or not.

## 2022-04-17 NOTE — ED Notes (Signed)
Patient had a 4 beat run of vtach and Dr Tamala Julian was made aware via chat.

## 2022-04-17 NOTE — ED Notes (Signed)
Echo at bedside

## 2022-04-18 DIAGNOSIS — J189 Pneumonia, unspecified organism: Secondary | ICD-10-CM | POA: Diagnosis not present

## 2022-04-18 LAB — CBC
HCT: 24.8 % — ABNORMAL LOW (ref 39.0–52.0)
Hemoglobin: 8.6 g/dL — ABNORMAL LOW (ref 13.0–17.0)
MCH: 33.1 pg (ref 26.0–34.0)
MCHC: 34.7 g/dL (ref 30.0–36.0)
MCV: 95.4 fL (ref 80.0–100.0)
Platelets: 126 10*3/uL — ABNORMAL LOW (ref 150–400)
RBC: 2.6 MIL/uL — ABNORMAL LOW (ref 4.22–5.81)
RDW: 13.8 % (ref 11.5–15.5)
WBC: 6.6 10*3/uL (ref 4.0–10.5)
nRBC: 0 % (ref 0.0–0.2)

## 2022-04-18 LAB — RENAL FUNCTION PANEL
Albumin: 2.7 g/dL — ABNORMAL LOW (ref 3.5–5.0)
Anion gap: 12 (ref 5–15)
BUN: 41 mg/dL — ABNORMAL HIGH (ref 6–20)
CO2: 26 mmol/L (ref 22–32)
Calcium: 8.4 mg/dL — ABNORMAL LOW (ref 8.9–10.3)
Chloride: 98 mmol/L (ref 98–111)
Creatinine, Ser: 9.86 mg/dL — ABNORMAL HIGH (ref 0.61–1.24)
GFR, Estimated: 6 mL/min — ABNORMAL LOW (ref >60)
Glucose, Bld: 105 mg/dL — ABNORMAL HIGH (ref 70–99)
Phosphorus: 3 mg/dL (ref 2.5–4.6)
Potassium: 3.4 mmol/L — ABNORMAL LOW (ref 3.5–5.1)
Sodium: 136 mmol/L (ref 135–145)

## 2022-04-18 LAB — MAGNESIUM: Magnesium: 1.7 mg/dL (ref 1.7–2.4)

## 2022-04-18 MED ORDER — POTASSIUM CHLORIDE 20 MEQ PO PACK
40.0000 meq | PACK | Freq: Once | ORAL | Status: AC
  Administered 2022-04-18: 40 meq via ORAL
  Filled 2022-04-18: qty 2

## 2022-04-18 MED ORDER — AZITHROMYCIN 500 MG PO TABS
500.0000 mg | ORAL_TABLET | Freq: Every day | ORAL | Status: DC
  Administered 2022-04-18: 500 mg via ORAL
  Filled 2022-04-18 (×2): qty 1

## 2022-04-18 MED ORDER — SODIUM CHLORIDE 0.9 % IV SOLN
500.0000 mg | INTRAVENOUS | Status: DC
  Filled 2022-04-18: qty 5

## 2022-04-18 NOTE — Progress Notes (Signed)
PROGRESS NOTE    Corey Park  RJJ:[id number] DOB: 09/16/1970 DOA: 04/16/2022 PCP: Default, Provider, MD   Brief Narrative:  This 51 y.o. male with medical history significant of hypertension, systolic CHF, PAH, ESRD on HD, diet controlled DM type II, blindness, and history of GI bleed presents with complaints of chest pain. He also reports Shortness of breath and was unable to take deep breath due to chest pain.  He has had dialysis but only completed for 2 hours after getting to his appointment date.  He reports having chronic hiccups, nausea and vomiting secondary to dialysis.  Episodes can last a week and then go away for a week prior to recurring.  He was found to be febrile with a temp of 101.7 on arrival.  BNP 957,  CTA chest showed patchy dependent bibasilar airspace opacities concerning for atelectasis or pneumonia.  His symptoms were initially thought to be due to missed dialysis/fluid overload.  He underwent hemodialysis, however after dialysis he has developed fever and is started on empiric antibiotics.  Patient is admitted for community-acquired pneumonia.  Assessment & Plan:   Principal Problem:   Pneumonia Active Problems:   Sepsis ([place])   Acute blood loss anemia   History of GI bleed   Elevated troponin   Atypical chest pain   ESRD on dialysis ([place])   Chronic systolic heart failure (HCC)   Hypertensive urgency   Nausea and vomiting   Chronic hiccups   Thrombocytopenia (HCC)   DM (diabetes mellitus), type 2 (HCC)   GERD (gastroesophageal reflux disease)  Sepsis secondary to community-acquired pneumonia: He presented with fever 101, tachycardia, tachypnea meeting SIRS criteria CT chest concerning for possible atelectasis or pneumonia, PE ruled out. Initiated empirical antibiotics Rocephin and Zithromax. Other sources of infection could include bacteremia given dialysis. Continue empiric antibiotics, lactic acid 0.8.  Procalcitonin 1.27 Follow blood cultures, urine  cultures, Continue incentive spirometry and flutter valve  Acute blood loss anemia / history of GI bleed: Hemoglobin 9.5 on arrival, previous Hb 14.3 on 9/25. He felt like having a bloody bowel movement yesterday. He denies any further bleeding after hospitalization.   Patient previously had GI bleed back in 11/2018. He underwent EGD by Dr. Candis Schatz which noted gastritis, nodules found in the duodenum which were biopsied, and copious frothy sputum suggestive of underlying esophageal motility disorder.  Colonoscopy noted a few small mouth diverticula in the sigmoid colon, large lipoma 15 mm in diameter in the transverse colon, and nonbleeding internal hemorrhoids grade 1. Check stool for occult blood. Consider GI consult if concern for active bleeding.   Atypical chest pain: Chronic.  He presented with complaints of substernal chest pain with radiation to the right shoulder.   CT angiogram negative for PE.  High-sensitivity troponin essentially flat 94-> 96.   EKG with more pronounced T wave changes in the anterior leads. Echo: LVEF 50%, inferior wall hypokinesis   ESRD on hemodialysis: Patient normally dialyzes Monday, Wednesday, and Friday.   Last hemodialysis session was shortened due to patient arriving late.   He was dialyzed yesterday and his symptoms has improved. Renal function panel daily Nephrology follow-up.   Acute on chronic systolic CHF: Acute on chronic.  Last echocardiogram noted EF of 40 to 45% back in 11/2020.   BNP was elevated at 957.7. Fluid management with hemodialysis   Essential hypertension: Continue Coreg 25 mg twice daily. Hydralazine as needed   Chronic hiccups: Chronic.  Patient reports intermittent episodes of hiccups lasting several days with  nausea and vomiting which he was told no clear cause has been found.  EGD noted concern for the possibility of esophageal dysmotility. -Likely needs to follow-up with GI for further work-up in the outpatient  setting.   Thrombocytopenia Chronic.  Outpatient follow-up   Diet-controlled diabetes mellitus type 2 Diet controlled.  Last hemoglobin A1c 6.2 in 11/2019.   GERD Continue pantoprazole 40 mg daily   DVT prophylaxis:SCDs Code Status:Full code. Family Communication: No family at bed side Disposition Plan:   Status is: Inpatient Remains inpatient appropriate because:  Admitted for hemodialysis due to fluid overload, then spiked fever found to have pneumonia and started on IV antibiotics.   Anticipated discharge home 04/19/2022.  Consultants:  Nephrology  Procedures: CT chest Antimicrobials: Ceftriaxone and Zithromax  Subjective: Patient was seen and examined at bedside.  Overnight events noted.  Patient reports feeling better. He still reports having cough, denies any further bleeding in the stools.  Objective: Vitals:   04/17/22 1802 04/17/22 1932 04/18/22 0400 04/18/22 0835  BP:  (!) 148/85 137/88 (!) 146/75  Pulse:  87 77 83  Resp:  '18 20 18  '$ Temp: 99.6 F (37.6 C) 99.3 F (37.4 C) 99 F (37.2 C) 98.7 F (37.1 C)  TempSrc: Oral Oral  Oral  SpO2:  100% 99% 100%  Weight:      Height:        Intake/Output Summary (Last 24 hours) at 04/18/2022 1333 Last data filed at 04/18/2022 1247 Gross per 24 hour  Intake 1247.03 ml  Output 200 ml  Net 1047.03 ml   Filed Weights   04/16/22 1413  Weight: 97.5 kg    Examination:  General exam: Appears comfortable, not in any acute distress, deconditioned. Respiratory system: CTA bilaterally, no wheezing, no crackles, normal respiratory effort. Cardiovascular system: S1 & S2 heard, regular rate and rhythm, no murmur. Gastrointestinal system: Abdomen is soft, non tender, non distended, BS+ Central nervous system: Alert and oriented x 3. No focal neurological deficits. Extremities: No edema, no cyanosis, no clubbing. Skin: No rashes, lesions or ulcers Psychiatry: Judgement and insight appear normal. Mood & affect  appropriate.     Data Reviewed: I have personally reviewed following labs and imaging studies  CBC: Recent Labs  Lab 04/16/22 1516 04/16/22 1518 04/17/22 0850 04/17/22 1400 04/18/22 0425  WBC  --  10.0 7.4  --  6.6  NEUTROABS  --  8.1* 5.2  --   --   HGB 9.9*  9.2* 9.5* 8.9* 9.5* 8.6*  HCT 29.0*  27.0* 27.5* 26.2* 27.8* 24.8*  MCV  --  95.5 96.0  --  95.4  PLT  --  138* 121*  --  935*   Basic Metabolic Panel: Recent Labs  Lab 04/16/22 1516 04/16/22 1518 04/17/22 0720 04/18/22 0425  NA 137  137 135 136 136  K 3.8  3.8 3.6 3.3* 3.4*  CL 100 102 97* 98  CO2  --  '23 28 26  '$ GLUCOSE 113* 115* 110* 105*  BUN 43* 41* 25* 41*  CREATININE 11.80* 10.72* 7.21* 9.86*  CALCIUM  --  9.1 8.7* 8.4*  MG  --   --   --  1.7  PHOS  --   --  2.5 3.0   GFR: Estimated Creatinine Clearance: 10.9 mL/min (A) (by C-G formula based on SCr of 9.86 mg/dL (H)). Liver Function Tests: Recent Labs  Lab 04/16/22 1518 04/17/22 0720 04/18/22 0425  AST 18  --   --   ALT 13  --   --  ALKPHOS 69  --   --   BILITOT 0.6  --   --   PROT 7.1  --   --   ALBUMIN 3.3* 3.1* 2.7*   Recent Labs  Lab 04/16/22 1518  LIPASE 50   No results for input(s): "AMMONIA" in the last 168 hours. Coagulation Profile: No results for input(s): "INR", "PROTIME" in the last 168 hours. Cardiac Enzymes: No results for input(s): "CKTOTAL", "CKMB", "CKMBINDEX", "TROPONINI" in the last 168 hours. BNP (last 3 results) No results for input(s): "PROBNP" in the last 8760 hours. HbA1C: No results for input(s): "HGBA1C" in the last 72 hours. CBG: No results for input(s): "GLUCAP" in the last 168 hours. Lipid Profile: No results for input(s): "CHOL", "HDL", "LDLCALC", "TRIG", "CHOLHDL", "LDLDIRECT" in the last 72 hours. Thyroid Function Tests: No results for input(s): "TSH", "T4TOTAL", "FREET4", "T3FREE", "THYROIDAB" in the last 72 hours. Anemia Panel: No results for input(s): "VITAMINB12", "FOLATE", "FERRITIN",  "TIBC", "IRON", "RETICCTPCT" in the last 72 hours. Sepsis Labs: Recent Labs  Lab 04/17/22 0720 04/17/22 0850  PROCALCITON 1.27  --   LATICACIDVEN  --  0.8    Recent Results (from the past 240 hour(s))  Resp Panel by RT-PCR (Flu A&B, Covid) Anterior Nasal Swab     Status: None   Collection Time: 04/17/22  5:17 AM   Specimen: Anterior Nasal Swab  Result Value Ref Range Status   SARS Coronavirus 2 by RT PCR NEGATIVE NEGATIVE Final    Comment: (NOTE) SARS-CoV-2 target nucleic acids are NOT DETECTED.  The SARS-CoV-2 RNA is generally detectable in upper respiratory specimens during the acute phase of infection. The lowest concentration of SARS-CoV-2 viral copies this assay can detect is 138 copies/mL. A negative result does not preclude SARS-Cov-2 infection and should not be used as the sole basis for treatment or other patient management decisions. A negative result may occur with  improper specimen collection/handling, submission of specimen other than nasopharyngeal swab, presence of viral mutation(s) within the areas targeted by this assay, and inadequate number of viral copies(<138 copies/mL). A negative result must be combined with clinical observations, patient history, and epidemiological information. The expected result is Negative.  Fact Sheet for Patients:  EntrepreneurPulse.com.au  Fact Sheet for Healthcare Providers:  IncredibleEmployment.be  This test is no t yet approved or cleared by the [place] FDA and  has been authorized for detection and/or diagnosis of SARS-CoV-2 by FDA under an Emergency Use Authorization (EUA). This EUA will remain  in effect (meaning this test can be used) for the duration of the COVID-19 declaration under Section 564(b)(1) of the Act, 21 U.S.C.section 360bbb-3(b)(1), unless the authorization is terminated  or revoked sooner.       Influenza A by PCR NEGATIVE NEGATIVE Final   Influenza B by  PCR NEGATIVE NEGATIVE Final    Comment: (NOTE) The Xpert Xpress SARS-CoV-2/FLU/RSV plus assay is intended as an aid in the diagnosis of influenza from Nasopharyngeal swab specimens and should not be used as a sole basis for treatment. Nasal washings and aspirates are unacceptable for Xpert Xpress SARS-CoV-2/FLU/RSV testing.  Fact Sheet for Patients: EntrepreneurPulse.com.au  Fact Sheet for Healthcare Providers: IncredibleEmployment.be  This test is not yet approved or cleared by the [place] FDA and has been authorized for detection and/or diagnosis of SARS-CoV-2 by FDA under an Emergency Use Authorization (EUA). This EUA will remain in effect (meaning this test can be used) for the duration of the COVID-19 declaration under Section 564(b)(1) of the Act, 21 U.S.C.  section 360bbb-3(b)(1), unless the authorization is terminated or revoked.  Performed at [facility], [place] [address], [place], [place] [address]   Blood culture (routine x 2)     Status: None (Preliminary result)   Collection Time: 04/17/22  7:14 AM   Specimen: BLOOD  Result Value Ref Range Status   Specimen Description BLOOD SITE NOT SPECIFIED  Final   Special Requests   Final    BOTTLES DRAWN AEROBIC AND ANAEROBIC Blood Culture adequate volume   Culture   Final    NO GROWTH < 24 HOURS Performed at [facility], [place] [address] [place], [place] [address]    Report Status PENDING  Incomplete  Blood culture (routine x 2)     Status: None (Preliminary result)   Collection Time: 04/17/22  7:20 AM   Specimen: BLOOD  Result Value Ref Range Status   Specimen Description BLOOD SITE NOT SPECIFIED  Final   Special Requests   Final    BOTTLES DRAWN AEROBIC AND ANAEROBIC Blood Culture adequate volume   Culture   Final    NO GROWTH < 24 HOURS Performed at [facility], [place] [address] [place],  [address]    Report Status PENDING  Incomplete   Radiology  Studies: ECHOCARDIOGRAM COMPLETE  Result Date: 04/17/2022    ECHOCARDIOGRAM REPORT   Patient Name:   Corey Park Date of Exam: 04/17/2022 Medical Rec #:  [id number]       Height:       73.0 in Accession #:    [id number]      Weight:       215.0 lb Date of Birth:  [birth year]       BSA:          2.219 m Patient Age:    79 years        BP:           133/93 mmHg Patient Gender: M               HR:           80 bpm. Exam Location:  Inpatient Procedure: 2D Echo, 3D Echo, Cardiac Doppler and Color Doppler Indications:    Chest pain  History:        Patient has prior history of Echocardiogram examinations. CHF                 and Cardiomyopathy, Pulmonary HTN,                 Signs/Symptoms:Nausea/Vomiting; Risk Factors:Hypertension,                 Diabetes and Family History of Coronary Artery Disease. ESRD.  Sonographer:    Eartha Inch Referring Phys: [id number] RONDELL A SMITH IMPRESSIONS  1. Left ventricular ejection fraction, by estimation, is 55%. The left ventricle has normal function. The left ventricle demonstrates regional wall motion abnormalities with basal inferior hypokinesis. There is mild concentric left ventricular hypertrophy. Left ventricular diastolic parameters are consistent with Grade II diastolic dysfunction (pseudonormalization).  2. Right ventricular systolic function is normal. The right ventricular size is normal. Tricuspid regurgitation signal is inadequate for assessing PA pressure.  3. The mitral valve is normal in structure. Trivial mitral valve regurgitation. No evidence of mitral stenosis.  4. The aortic valve is tricuspid. Aortic valve regurgitation is not visualized. No aortic stenosis is present.  5. The inferior vena cava is normal in size with greater than 50% respiratory  variability, suggesting right atrial pressure of 3 mmHg. FINDINGS  Left Ventricle: Left ventricular ejection fraction, by estimation, is 55%. The left ventricle has normal function. The left ventricle  demonstrates regional wall motion abnormalities. The left ventricular internal cavity size was normal in size. There is  mild concentric left ventricular hypertrophy. Left ventricular diastolic parameters are consistent with Grade II diastolic dysfunction (pseudonormalization). Right Ventricle: The right ventricular size is normal. No increase in right ventricular wall thickness. Right ventricular systolic function is normal. Tricuspid regurgitation signal is inadequate for assessing PA pressure. Left Atrium: Left atrial size was normal in size. Right Atrium: Right atrial size was normal in size. Pericardium: There is no evidence of pericardial effusion. Mitral Valve: The mitral valve is normal in structure. Trivial mitral valve regurgitation. No evidence of mitral valve stenosis. MV peak gradient, 5.0 mmHg. The mean mitral valve gradient is 3.0 mmHg. Tricuspid Valve: The tricuspid valve is normal in structure. Tricuspid valve regurgitation is not demonstrated. Aortic Valve: The aortic valve is tricuspid. Aortic valve regurgitation is not visualized. No aortic stenosis is present. Pulmonic Valve: The pulmonic valve was normal in structure. Pulmonic valve regurgitation is trivial. Aorta: The aortic root is normal in size and structure. Venous: The inferior vena cava is normal in size with greater than 50% respiratory variability, suggesting right atrial pressure of 3 mmHg. IAS/Shunts: No atrial level shunt detected by color flow Doppler.  LEFT VENTRICLE PLAX 2D LVIDd:         5.00 cm   Diastology LVIDs:         3.70 cm   LV e' medial:    6.83 cm/s LV PW:         1.40 cm   LV E/e' medial:  15.2 LV IVS:        1.30 cm   LV e' lateral:   7.27 cm/s LVOT diam:     2.30 cm   LV E/e' lateral: 14.3 LV SV:         106 LV SV Index:   48 LVOT Area:     4.15 cm  RIGHT VENTRICLE             IVC RV S prime:     17.10 cm/s  IVC diam: 1.80 cm TAPSE (M-mode): 2.1 cm LEFT ATRIUM             Index        RIGHT ATRIUM           Index  LA diam:        3.80 cm 1.71 cm/m   RA Area:     12.50 cm LA Vol (A2C):   58.7 ml 26.46 ml/m  RA Volume:   28.50 ml  12.84 ml/m LA Vol (A4C):   51.6 ml 23.26 ml/m LA Biplane Vol: 55.8 ml 25.15 ml/m  AORTIC VALVE             PULMONIC VALVE LVOT Vmax:   126.00 cm/s PR End Diast Vel: 3.61 msec LVOT Vmean:  95.000 cm/s LVOT VTI:    0.254 m  AORTA Ao Root diam: 3.60 cm Ao Asc diam:  3.70 cm MITRAL VALVE MV Area (PHT): 4.33 cm     SHUNTS MV Area VTI:   3.88 cm     Systemic VTI:  0.25 m MV Peak grad:  5.0 mmHg     Systemic Diam: 2.30 cm MV Mean grad:  3.0 mmHg MV Vmax:       1.12 m/s  MV Vmean:      79.3 cm/s MV Decel Time: 175 msec MV E velocity: 104.00 cm/s MV A velocity: 71.90 cm/s MV E/A ratio:  1.45 Dalton McleanMD Electronically signed by Franki Monte Signature Date/Time: 04/17/2022/3:14:41 PM    Final    CT Angio Chest PE W and/or Wo Contrast  Result Date: 04/16/2022 CLINICAL DATA:  Pulmonary embolism (PE) suspected, high prob EXAM: CT ANGIOGRAPHY CHEST WITH CONTRAST TECHNIQUE: Multidetector CT imaging of the chest was performed using the standard protocol during bolus administration of intravenous contrast. Multiplanar CT image reconstructions and MIPs were obtained to evaluate the vascular anatomy. RADIATION DOSE REDUCTION: This exam was performed according to the departmental dose-optimization program which includes automated exposure control, adjustment of the mA and/or kV according to patient size and/or use of iterative reconstruction technique. CONTRAST:  80 mL Omnipaque 350 IV contrast COMPARISON:  Same day chest x-ray.  CT 03/07/2021 FINDINGS: Cardiovascular: Satisfactory opacification of the pulmonary arteries to the segmental level although evaluation of the lung bases is degraded by respiratory motion artifact. No evidence of pulmonary embolism. Pulmonary trunk is mildly dilated at 3.6 cm. Thoracic aorta is nonaneurysmal. Scattered atherosclerotic vascular calcifications of the aorta and  coronary arteries. Mild cardiomegaly. No pericardial effusion. Mediastinum/Nodes: No enlarged mediastinal, hilar, or axillary lymph nodes. Thyroid gland, trachea, and esophagus demonstrate no significant findings. Lungs/Pleura: Shallow inspiration. Patchy airspace consolidations within the dependent portions of the bilateral lower lobes. Upper lung fields are clear. No pleural effusion. No pneumothorax. Upper Abdomen: No acute abnormality. Musculoskeletal: No chest wall abnormality. No acute or significant osseous findings. Review of the MIP images confirms the above findings. IMPRESSION: 1. No evidence of pulmonary embolism. 2. Low lung volumes with patchy dependent bibasilar airspace consolidations, which may represent atelectasis and/or pneumonia. 3. Mild cardiomegaly. 4. Mildly dilated main pulmonary trunk, which can be seen in the setting of pulmonary arterial hypertension. 5. Aortic and coronary artery atherosclerosis (ICD10-I70.0). Electronically Signed   By: Davina Poke D.O.   On: 04/16/2022 17:48   DG Chest Port 1 View  Result Date: 04/16/2022 CLINICAL DATA:  Chest pain. EXAM: PORTABLE CHEST 1 VIEW COMPARISON:  March 20, 2022. FINDINGS: Mild cardiomegaly is noted with mild central pulmonary vascular congestion. Minimal bibasilar subsegmental atelectasis or edema is noted. Bony thorax is unremarkable. IMPRESSION: Mild cardiomegaly with mild central pulmonary vascular congestion. Minimal bibasilar subsegmental atelectasis or edema is noted. Electronically Signed   By: Marijo Conception M.D.   On: 04/16/2022 16:09    Scheduled Meds:  azithromycin  500 mg Oral Daily   calcium acetate  1,334 mg Oral TID WC   carvedilol  25 mg Oral BID   Chlorhexidine Gluconate Cloth  6 each Topical Q0600   guaiFENesin  600 mg Oral BID   multivitamin  1 tablet Oral Daily   pantoprazole  40 mg Oral BID   sodium chloride flush  3 mL Intravenous Q12H   Continuous Infusions:  cefTRIAXone (ROCEPHIN)  IV 2 g  (04/18/22 0903)     LOS: 1 day    Time spent: 50 mins    Lizzett Nobile, MD [facility]   If 7PM-7AM, please contact night-coverage

## 2022-04-18 NOTE — TOC Initial Note (Signed)
Transition of Care [facility]) - Initial/Assessment Note    Patient Details  Name: Corey Park MRN: [id number] Date of Birth: 1971-02-18  Transition of Care [facility]) CM/SW Contact:    Tom-Johnson, Renea Ee, RN Phone Number: 04/18/2022, 3:42 PM  Clinical Narrative:                  CM spoke with patient at bedside about needs for post hospital transition. Admitted for Pneumonia, on IV abx. Patient is legally blind, lives with his son who is currently in [place]. Patient is on MWF dialysis schedule. Employed at [employer] and uses [employer] to and from appointments and work. Patient states he is having Financial strain with paying for transportation as he has rent and other bill to pay for which sometimes left him unable to pay for [employer]. States he uses [employer] transportation when available. States he has Medicaid but information not on file, does not know ID number. CM contacted Laurann [place] ans Tamiko at [facility] office and left a secure message to return call, awaiting call.  Patient requesting for Affordable housing resources, LCSW notified.  Patient does not have PCP and requests to schedule hospital f/u with Internal medicine. Hosp f/u scheduled and info on AVS. Production designer, theatre/television/film pharmacy on [address].  No PT/OT recommendations noted at this time. CM will continue to follow as patient progresses with care towards discharge.  Expected Discharge Plan: Home/Self Care Barriers to Discharge: Continued Medical Work up   Patient Goals and CMS Choice Patient states their goals for this hospitalization and ongoing recovery are:: To return home CMS Medicare.gov Compare Post Acute Care list provided to:: Patient Choice offered to / list presented to : Patient  Expected Discharge Plan and Services Expected Discharge Plan: Home/Self Care In-house Referral: Clinical Social Work (Needs low cost housing resources.)   Post Acute Care Choice: NA Living arrangements for the past 2 months:  Apartment                 DME Arranged: N/A DME Agency: NA         HH Agency: NA        Prior Living Arrangements/Services Living arrangements for the past 2 months: Apartment Lives with:: Adult Children (Son in [place]) Patient language and need for interpreter reviewed:: Yes Do you feel safe going back to the place where you live?: Yes      Need for Family Participation in Patient Care: Yes (Comment) Care giver support system in place?: Yes (comment)   Criminal Activity/Legal Involvement Pertinent to Current Situation/Hospitalization: No - Comment as needed  Activities of Daily Living      Permission Sought/Granted Permission sought to share information with : Case Manager, Family Supports Permission granted to share information with : Yes, Verbal Permission Granted              Emotional Assessment Appearance:: Appears stated age Attitude/Demeanor/Rapport: Engaged, Gracious Affect (typically observed): Accepting, Appropriate, Calm, Hopeful, Pleasant Orientation: : Oriented to Self, Oriented to Place, Oriented to  Time, Oriented to Situation Alcohol / Substance Use: Not Applicable Psych Involvement: No (comment)  Admission diagnosis:  Shortness of breath [R06.02] Pneumonia [J18.9] ESRD on dialysis ([place]) [N18.6, Z99.2] Nonspecific chest pain [R07.9] Sepsis ([place]) [A41.9] Patient Active Problem List   Diagnosis Date Noted   Pneumonia 04/17/2022   Sepsis ([place]) 04/17/2022   Acute blood loss anemia 04/17/2022   Essential hypertension 04/17/2022   Thrombocytopenia ([place]) 04/17/2022   History of GI bleed 04/17/2022  Atypical chest pain 04/17/2022   GERD (gastroesophageal reflux disease) 04/17/2022   Nausea and vomiting 04/17/2022   Chronic hiccups 04/17/2022   Pain due to onychomycosis of toenails of both feet 10/11/2021   Hypercalcemia 04/13/2020   Coagulation defect, unspecified ([place]) 08/25/2019   Secondary hyperparathyroidism of renal origin ([place])  08/25/2019   Anemia in chronic kidney disease (CKD) 12/23/2018   ESRD on dialysis [facility])    Chronic systolic heart failure ([place]) 07/03/2018   Elevated troponin    Hypertensive urgency 12/11/2016   HLD (hyperlipidemia) 06/14/2007   DM (diabetes mellitus), type 2 ([place]) 06/11/2007   Hypertensive heart disease with CHF (congestive heart failure) ([place]) 06/11/2007   PCP:  Default, Provider, MD Pharmacy:   [facility] [place] [address] Phone: [phone] Fax: [phone]  [facility] [place] [place] [phone] [place] [place] [address] Phone: [phone] Fax: [phone]     Social Determinants of Health (SDOH) Interventions    Readmission Risk Interventions     No data to display

## 2022-04-18 NOTE — Consult Note (Addendum)
[facility] Renal Consultation Note    Indication for Consultation:  Management of ESRD/hemodialysis; anemia, hypertension/volume and secondary hyperparathyroidism  NGE:[id number], Provider, MD  HPI: Corey Park is a 51 y.o. male. ESRD on HD MWF at [facility]. Past medical history significant for chronic systolic heart failure, EF 35%, hypertensive cardiomyopathy, moderate pulmonary hypertension, blindness, DM2 and HTN.  Patient seen and examined at bedside.  Partial history obtained from chart review as patient did not want "to tell it again."  Presented to the ED due to CP radiating down the R arm and shortness of breath.  Also reported concern for GI bleed.  Pertinent findings in work up include fever with tmax 101.8, negative FOBT, CXR with mild cardiomegaly, mild central pulmonary vascular congestion and minimal bibasilar subsegmental atelectasis or edema; CTA chest with no PE, low lung volumes w/patchy dependent bibasilar air space consolidations, which may represent atelectasis and/or pneumonia, mild cardiomegaly and mildly dilated main pulmonary trunk.  Patient underwent dialysis overnight on 10/22 with net UF 3.2L.  Today, he reports feeling better.  Breathing improved after dialysis.  Denies CP, SOB, cough, fever, chills, weakness, dizziness, fatigue, abdominal pain and n/v/d.  Reports chronic hiccups which are not currently present.    Of note review of outpatient record shows non compliance with prescribed dialysis regimen, typically only staying for 2.5-3hrs of treatment over the last several months.  Has been getting close to his dry weights. Previously seen by GI for hiccups as outpatient. Patient admitted for further evaluation and management.   Past Medical History:  Diagnosis Date   Acute CHF (congestive heart failure) ([place]) 07/27/2019   AKI (acute kidney injury) ([place]) 12/11/2016   Allergy, unspecified, initial encounter 08/25/2019   Anemia 2021   Anesthesia of  skin 03/31/2020   Asthma    as a child   Blind    Cellulitis, perineum 11/26/2019   CHF (congestive heart failure) ([place]) 2021   Chronic kidney disease, stage V ([place]) 06/17/2018   Chronic kidney disease, stage V ([place]) 24/40/1027   Complication of vascular dialysis catheter 08/25/2019   COVID-19 virus infection 07/27/2019   Diabetes mellitus without complication ([place])    type 2   Dilated cardiomyopathy ([place]) 07/03/2018   Elevated troponin    Encounter for removal of sutures 08/04/2020   Fe deficiency anemia 12/23/2018   Fluid overload 12/11/2016   Hypertension    Hypertensive urgency 12/11/2016   Hypomagnesemia 12/13/2016   Legally blind    B/L   Pain, unspecified 10/17/2019   Pneumonia 2021   Renal disorder    dialysis T-TH-Sat   Shortness of breath 11/10/2019   Symptomatic anemia 12/21/2018   Type 2 diabetes mellitus with diabetic peripheral angiopathy without gangrene ([place]) 08/25/2019   Unspecified protein-calorie malnutrition ([place]) 08/25/2019   Past Surgical History:  Procedure Laterality Date   BASCILIC VEIN TRANSPOSITION Right 10/16/2019   Procedure: Basilic Vein Transposition Right Arm;  Surgeon: Serafina Mitchell, MD;  Location: [place] [facility];  Service: Vascular;  Laterality: Right;   [place] Right 01/29/2020   Procedure: RIGHT ARM SECOND STAGE [place];  Surgeon: Serafina Mitchell, MD;  Location: [facility];  Service: Vascular;  Laterality: Right;   BIOPSY  12/22/2018   Procedure: BIOPSY;  Surgeon: Laurence Spates, MD;  Location: [facility];  Service: Endoscopy;;   ESOPHAGOGASTRODUODENOSCOPY N/A 12/22/2018   Procedure: ESOPHAGOGASTRODUODENOSCOPY (EGD);  Surgeon: Laurence Spates, MD;  Location: [facility] ENDOSCOPY;  Service: Endoscopy;  Laterality: N/A;   IR  FLUORO GUIDE CV LINE RIGHT  07/29/2019   IR US GUIDE VASC ACCESS RIGHT  07/29/2019   Family History  Problem Relation Age of Onset   CAD Mother    Hypertension Mother    Diabetes Neg Hx    Stroke Neg Hx    Cancer Neg Hx     Kidney failure Neg Hx    Stomach cancer Neg Hx    Colon cancer Neg Hx    Rectal cancer Neg Hx    Social History:  reports that he has never smoked. He has never used smokeless tobacco. He reports that he does not currently use alcohol. He reports that he does not currently use drugs after having used the following drugs: Marijuana. No Known Allergies Prior to Admission medications   Medication Sig Start Date End Date Taking? Authorizing Provider  acetaminophen (TYLENOL) 500 MG tablet Take 1,000 mg by mouth every 6 (six) hours as needed for mild pain.   Yes [provider]  calcium acetate (PHOSLO) 667 MG tablet Take 1,334 mg by mouth 3 (three) times daily.  09/12/19  Yes [provider]  COREG 25 MG tablet Take 25 mg by mouth 2 (two) times daily. 04/12/22  Yes [provider]  DIPHENHYDRAMINE HCL PO Take 25 mg by mouth at bedtime as needed (sleep). 02/07/21 04/17/22 Yes [provider]  gabapentin (NEURONTIN) 100 MG capsule Take 100 mg by mouth 3 (three) times daily as needed (nerve pain).   Yes [provider]  multivitamin (RENA-VIT) TABS tablet Take 1 tablet by mouth daily. 08/29/19  Yes [provider]  pantoprazole (PROTONIX) 20 MG tablet Take 20 mg by mouth daily as needed for heartburn or indigestion. 02/11/22  Yes [provider]  chlorproMAZINE (THORAZINE) 25 MG tablet TAKE 1 TABLET BY MOUTH THREE TIMES DAILY Patient not taking: Reported on 04/17/2022 04/18/21   Daryel November, MD  IRON SUCROSE IV Inject 1 application as directed every Monday, Wednesday, and Friday. Done at dialysis 11/26/19 04/11/21  [provider]   Current Facility-Administered Medications  Medication Dose Route Frequency Provider Last Rate Last Admin   acetaminophen (TYLENOL) tablet 650 mg  650 mg Oral Q6H PRN Norval Morton, MD       Or   acetaminophen (TYLENOL) suppository 650 mg  650 mg Rectal Q6H PRN Fuller Plan A, MD        albuterol (PROVENTIL) (2.5 MG/3ML) 0.083% nebulizer solution 2.5 mg  2.5 mg Nebulization Q4H PRN Fuller Plan A, MD       azithromycin (ZITHROMAX) tablet 500 mg  500 mg Oral Daily Shawna Clamp, MD   500 mg at 04/18/22 1124   calcium acetate (PHOSLO) capsule 1,334 mg  1,334 mg Oral TID WC Ventura Sellers, RPH   1,334 mg at 04/18/22 1124   carvedilol (COREG) tablet 25 mg  25 mg Oral BID Fuller Plan A, MD   25 mg at 04/18/22 0854   cefTRIAXone (ROCEPHIN) 2 g in sodium chloride 0.9 % 100 mL IVPB  2 g Intravenous Q24H Shawna Clamp, MD 200 mL/hr at 04/18/22 0903 2 g at 04/18/22 1610   Chlorhexidine Gluconate Cloth 2 % PADS 6 each  6 each Topical Q0600 Fuller Plan A, MD       diphenhydrAMINE (BENADRYL) capsule 25 mg  25 mg Oral QHS PRN Smith, Rondell A, MD       gabapentin (NEURONTIN) capsule 100 mg  100 mg Oral TID PRN Norval Morton, MD  guaiFENesin (MUCINEX) 12 hr tablet 600 mg  600 mg Oral BID Fuller Plan A, MD   600 mg at 04/18/22 1191   multivitamin (RENA-VIT) tablet 1 tablet  1 tablet Oral Daily Fuller Plan A, MD   1 tablet at 04/18/22 0853   ondansetron (ZOFRAN) tablet 4 mg  4 mg Oral Q6H PRN Norval Morton, MD       Or   ondansetron (ZOFRAN) injection 4 mg  4 mg Intravenous Q6H PRN Tamala Julian, Rondell A, MD       pantoprazole (PROTONIX) EC tablet 20 mg  20 mg Oral Daily PRN Fuller Plan A, MD       pantoprazole (PROTONIX) EC tablet 40 mg  40 mg Oral BID Fuller Plan A, MD   40 mg at 04/18/22 0853   sodium chloride flush (NS) 0.9 % injection 3 mL  3 mL Intravenous Q12H Fuller Plan A, MD   3 mL at 04/18/22 0853   Labs: Basic Metabolic Panel: Recent Labs  Lab 04/16/22 1518 04/17/22 0720 04/18/22 0425  NA 135 136 136  K 3.6 3.3* 3.4*  CL 102 97* 98  CO2 '23 28 26  '$ GLUCOSE 115* 110* 105*  BUN 41* 25* 41*  CREATININE 10.72* 7.21* 9.86*  CALCIUM 9.1 8.7* 8.4*  PHOS  --  2.5 3.0   Liver Function Tests: Recent Labs  Lab 04/16/22 1518 04/17/22 0720  04/18/22 0425  AST 18  --   --   ALT 13  --   --   ALKPHOS 69  --   --   BILITOT 0.6  --   --   PROT 7.1  --   --   ALBUMIN 3.3* 3.1* 2.7*   Recent Labs  Lab 04/16/22 1518  LIPASE 50    CBC: Recent Labs  Lab 04/16/22 1518 04/17/22 0850 04/17/22 1400 04/18/22 0425  WBC 10.0 7.4  --  6.6  NEUTROABS 8.1* 5.2  --   --   HGB 9.5* 8.9* 9.5* 8.6*  HCT 27.5* 26.2* 27.8* 24.8*  MCV 95.5 96.0  --  95.4  PLT 138* 121*  --  126*   Studies/Results: ECHOCARDIOGRAM COMPLETE  Result Date: 04/17/2022    ECHOCARDIOGRAM REPORT   Patient Name:   Corey Park Date of Exam: 04/17/2022 Medical Rec #:  [id number]       Height:       73.0 in Accession #:    [id number]      Weight:       215.0 lb Date of Birth:  1970/08/04       BSA:          2.219 m Patient Age:    52 years        BP:           133/93 mmHg Patient Gender: M               HR:           80 bpm. Exam Location:  Inpatient Procedure: 2D Echo, 3D Echo, Cardiac Doppler and Color Doppler Indications:    Chest pain  History:        Patient has prior history of Echocardiogram examinations. CHF                 and Cardiomyopathy, Pulmonary HTN,                 Signs/Symptoms:Nausea/Vomiting; Risk Factors:Hypertension,  Diabetes and Family History of Coronary Artery Disease. ESRD.  Sonographer:    Eartha Inch Referring Phys: [id number] RONDELL A SMITH IMPRESSIONS  1. Left ventricular ejection fraction, by estimation, is 55%. The left ventricle has normal function. The left ventricle demonstrates regional wall motion abnormalities with basal inferior hypokinesis. There is mild concentric left ventricular hypertrophy. Left ventricular diastolic parameters are consistent with Grade II diastolic dysfunction (pseudonormalization).  2. Right ventricular systolic function is normal. The right ventricular size is normal. Tricuspid regurgitation signal is inadequate for assessing PA pressure.  3. The mitral valve is normal in structure. Trivial  mitral valve regurgitation. No evidence of mitral stenosis.  4. The aortic valve is tricuspid. Aortic valve regurgitation is not visualized. No aortic stenosis is present.  5. The inferior vena cava is normal in size with greater than 50% respiratory variability, suggesting right atrial pressure of 3 mmHg. FINDINGS  Left Ventricle: Left ventricular ejection fraction, by estimation, is 55%. The left ventricle has normal function. The left ventricle demonstrates regional wall motion abnormalities. The left ventricular internal cavity size was normal in size. There is  mild concentric left ventricular hypertrophy. Left ventricular diastolic parameters are consistent with Grade II diastolic dysfunction (pseudonormalization). Right Ventricle: The right ventricular size is normal. No increase in right ventricular wall thickness. Right ventricular systolic function is normal. Tricuspid regurgitation signal is inadequate for assessing PA pressure. Left Atrium: Left atrial size was normal in size. Right Atrium: Right atrial size was normal in size. Pericardium: There is no evidence of pericardial effusion. Mitral Valve: The mitral valve is normal in structure. Trivial mitral valve regurgitation. No evidence of mitral valve stenosis. MV peak gradient, 5.0 mmHg. The mean mitral valve gradient is 3.0 mmHg. Tricuspid Valve: The tricuspid valve is normal in structure. Tricuspid valve regurgitation is not demonstrated. Aortic Valve: The aortic valve is tricuspid. Aortic valve regurgitation is not visualized. No aortic stenosis is present. Pulmonic Valve: The pulmonic valve was normal in structure. Pulmonic valve regurgitation is trivial. Aorta: The aortic root is normal in size and structure. Venous: The inferior vena cava is normal in size with greater than 50% respiratory variability, suggesting right atrial pressure of 3 mmHg. IAS/Shunts: No atrial level shunt detected by color flow Doppler.  LEFT VENTRICLE PLAX 2D LVIDd:          5.00 cm   Diastology LVIDs:         3.70 cm   LV e' medial:    6.83 cm/s LV PW:         1.40 cm   LV E/e' medial:  15.2 LV IVS:        1.30 cm   LV e' lateral:   7.27 cm/s LVOT diam:     2.30 cm   LV E/e' lateral: 14.3 LV SV:         106 LV SV Index:   48 LVOT Area:     4.15 cm  RIGHT VENTRICLE             IVC RV S prime:     17.10 cm/s  IVC diam: 1.80 cm TAPSE (M-mode): 2.1 cm LEFT ATRIUM             Index        RIGHT ATRIUM           Index LA diam:        3.80 cm 1.71 cm/m   RA Area:     12.50 cm LA Vol (A2C):  58.7 ml 26.46 ml/m  RA Volume:   28.50 ml  12.84 ml/m LA Vol (A4C):   51.6 ml 23.26 ml/m LA Biplane Vol: 55.8 ml 25.15 ml/m  AORTIC VALVE             PULMONIC VALVE LVOT Vmax:   126.00 cm/s PR End Diast Vel: 3.61 msec LVOT Vmean:  95.000 cm/s LVOT VTI:    0.254 m  AORTA Ao Root diam: 3.60 cm Ao Asc diam:  3.70 cm MITRAL VALVE MV Area (PHT): 4.33 cm     SHUNTS MV Area VTI:   3.88 cm     Systemic VTI:  0.25 m MV Peak grad:  5.0 mmHg     Systemic Diam: 2.30 cm MV Mean grad:  3.0 mmHg MV Vmax:       1.12 m/s MV Vmean:      79.3 cm/s MV Decel Time: 175 msec MV E velocity: 104.00 cm/s MV A velocity: 71.90 cm/s MV E/A ratio:  1.45 Dalton McleanMD Electronically signed by Franki Monte Signature Date/Time: 04/17/2022/3:14:41 PM    Final    CT Angio Chest PE W and/or Wo Contrast  Result Date: 04/16/2022 CLINICAL DATA:  Pulmonary embolism (PE) suspected, high prob EXAM: CT ANGIOGRAPHY CHEST WITH CONTRAST TECHNIQUE: Multidetector CT imaging of the chest was performed using the standard protocol during bolus administration of intravenous contrast. Multiplanar CT image reconstructions and MIPs were obtained to evaluate the vascular anatomy. RADIATION DOSE REDUCTION: This exam was performed according to the departmental dose-optimization program which includes automated exposure control, adjustment of the mA and/or kV according to patient size and/or use of iterative reconstruction technique.  CONTRAST:  80 mL Omnipaque 350 IV contrast COMPARISON:  Same day chest x-ray.  CT 03/07/2021 FINDINGS: Cardiovascular: Satisfactory opacification of the pulmonary arteries to the segmental level although evaluation of the lung bases is degraded by respiratory motion artifact. No evidence of pulmonary embolism. Pulmonary trunk is mildly dilated at 3.6 cm. Thoracic aorta is nonaneurysmal. Scattered atherosclerotic vascular calcifications of the aorta and coronary arteries. Mild cardiomegaly. No pericardial effusion. Mediastinum/Nodes: No enlarged mediastinal, hilar, or axillary lymph nodes. Thyroid gland, trachea, and esophagus demonstrate no significant findings. Lungs/Pleura: Shallow inspiration. Patchy airspace consolidations within the dependent portions of the bilateral lower lobes. Upper lung fields are clear. No pleural effusion. No pneumothorax. Upper Abdomen: No acute abnormality. Musculoskeletal: No chest wall abnormality. No acute or significant osseous findings. Review of the MIP images confirms the above findings. IMPRESSION: 1. No evidence of pulmonary embolism. 2. Low lung volumes with patchy dependent bibasilar airspace consolidations, which may represent atelectasis and/or pneumonia. 3. Mild cardiomegaly. 4. Mildly dilated main pulmonary trunk, which can be seen in the setting of pulmonary arterial hypertension. 5. Aortic and coronary artery atherosclerosis (ICD10-I70.0). Electronically Signed   By: Davina Poke D.O.   On: 04/16/2022 17:48   DG Chest Port 1 View  Result Date: 04/16/2022 CLINICAL DATA:  Chest pain. EXAM: PORTABLE CHEST 1 VIEW COMPARISON:  March 20, 2022. FINDINGS: Mild cardiomegaly is noted with mild central pulmonary vascular congestion. Minimal bibasilar subsegmental atelectasis or edema is noted. Bony thorax is unremarkable. IMPRESSION: Mild cardiomegaly with mild central pulmonary vascular congestion. Minimal bibasilar subsegmental atelectasis or edema is noted.  Electronically Signed   By: Marijo Conception M.D.   On: 04/16/2022 16:09    ROS: All others negative except those listed in HPI.  Physical Exam: Vitals:   04/17/22 1802 04/17/22 1932 04/18/22 0400 04/18/22 0835  BP:  [name])  148/85 137/88 (!) 146/75  Pulse:  87 77 83  Resp:  '18 20 18  '$ Temp: 99.6 F (37.6 C) 99.3 F (37.4 C) 99 F (37.2 C) 98.7 F (37.1 C)  TempSrc: Oral Oral  Oral  SpO2:  100% 99% 100%  Weight:      Height:         General: WDWN, blind male in NAD Head: NCAT sclera not icteric MMM Neck: Supple. No lymphadenopathy Lungs: CTA bilaterally. No wheeze, rales or rhonchi. Breathing is unlabored on RA Heart: RRR. No murmur, rubs or gallops.  Abdomen: soft, nontender, +BS, no guarding, no rebound tenderness Lower extremities:no edema, ischemic changes, or open wounds  Neuro: AAOx3. Moves all extremities spontaneously. Psych:  Responds to questions appropriately with a normal affect. Dialysis Access: RU AVF +b/t  Dialysis Orders:  MWF - [facility] 3rd shift  4hrs, BFR 450, DFR AF 1.5,,  EDW 98.5kg, 2K/ 2Ca  Access: RU AVF   Heparin 6000 units Mircera 75 mcg q2wks - last 10/18 Calcitriol 0.29mg PO qHD Sensipar '30mg'$  qHD  Assessment/Plan:  CAP - fever on admit, afebrile x 24hrs.  CXR/CTA with possible PNA, on ABX  ESRD -  on HD MWF.  HD completed in early morning of 10/23.  Next HD 3rd shift tomorrow.  If d/c tomorrow can resume OP HD.  No acute indication for HD today.  If remains admitted can be completed here.  Concern for GIB - Hgb 8.6 here.  FOBT negative. Has been 10.5-11.9 over last few months as outpatient.   Hypertension/volume  - BP variable. Continue home meds.  Does not appear grossly volume overloaded.  CXR with mild vascular congestion. Net UF 3.2L last HD.  Continue to titrate down as tolerated. Try to get standing weight if possible to better assess edw.   Anemia of CKD - Hgb 8.6.  ESA recently dosed.  Secondary Hyperparathyroidism -  Calcium and phos in goal.   Continue VDRA, sensipar and binders.   Nutrition - Renal diet w/fluid restrictions.  Systolic HF - EF improved to 55% on echo.  Pulmonary HTN - noted on echo  LJen Mow PA-C [facility] 04/18/2022, 1:38 PM    Seen and examined independently.  Agree with note and exam as documented above by physician extender and as noted here.  Patient with ESRD presented with shortness of breath found to have PNA.  Per primary team also concern for GIB.  He feels fine.  Having some trouble getting to HD due to cost of transportation and he states he spoke with someone about applying for assistance   General adult male in bed in no acute distress HEENT normocephalic atraumatic extraocular movements intact  Neck supple trachea midline Lungs clear to auscultation bilaterally normal work of breathing at rest  Heart S1S2 no rub Abdomen soft nontender nondistended Extremities no edema  Neuro - grossly visually impaired; alert and oriented x 3 provides hx and follows commands Psych normal mood and affect Access RUE AVF bruit and thrill   CAP - abx per primary team   ESRD - on HD MWF.  If discharged tomorrow he is able to go to his third shift chair at [facility]  Note that he has some financial issues with transportation and he states a case worker is helping him to apply for a service.    Concern for GIB - per primary team   HTN - optimize volume with HD   Anemia CKD - recent ESA outpatient  Secondary hyperparathyroidism - continue act vit D and sensipar with outpatient HD and continue phoslo  Chronic systolic CHF - optimize volume with HD   Claudia Desanctis, MD 04/18/2022 6:53 PM

## 2022-04-19 ENCOUNTER — Other Ambulatory Visit (HOSPITAL_COMMUNITY): Payer: Self-pay

## 2022-04-19 DIAGNOSIS — J189 Pneumonia, unspecified organism: Secondary | ICD-10-CM | POA: Diagnosis not present

## 2022-04-19 LAB — RENAL FUNCTION PANEL
Albumin: 2.7 g/dL — ABNORMAL LOW (ref 3.5–5.0)
Anion gap: 14 (ref 5–15)
BUN: 57 mg/dL — ABNORMAL HIGH (ref 6–20)
CO2: 23 mmol/L (ref 22–32)
Calcium: 8.8 mg/dL — ABNORMAL LOW (ref 8.9–10.3)
Chloride: 101 mmol/L (ref 98–111)
Creatinine, Ser: 12.06 mg/dL — ABNORMAL HIGH (ref 0.61–1.24)
GFR, Estimated: 5 mL/min — ABNORMAL LOW (ref >60)
Glucose, Bld: 181 mg/dL — ABNORMAL HIGH (ref 70–99)
Phosphorus: 3.5 mg/dL (ref 2.5–4.6)
Potassium: 3.4 mmol/L — ABNORMAL LOW (ref 3.5–5.1)
Sodium: 138 mmol/L (ref 135–145)

## 2022-04-19 LAB — HEMOGLOBIN AND HEMATOCRIT, BLOOD
HCT: 24.7 % — ABNORMAL LOW (ref 39.0–52.0)
Hemoglobin: 8.7 g/dL — ABNORMAL LOW (ref 13.0–17.0)

## 2022-04-19 MED ORDER — AMOXICILLIN-POT CLAVULANATE 500-125 MG PO TABS
1.0000 | ORAL_TABLET | Freq: Every day | ORAL | 0 refills | Status: AC
  Filled 2022-04-19 – 2022-12-14 (×2): qty 5, 5d supply, fill #0

## 2022-04-19 MED ORDER — AMOXICILLIN-POT CLAVULANATE 875-125 MG PO TABS
1.0000 | ORAL_TABLET | Freq: Two times a day (BID) | ORAL | 0 refills | Status: DC
  Filled 2022-04-19: qty 10, 5d supply, fill #0

## 2022-04-19 MED ORDER — ALBUTEROL SULFATE HFA 108 (90 BASE) MCG/ACT IN AERS
2.0000 | INHALATION_SPRAY | Freq: Four times a day (QID) | RESPIRATORY_TRACT | 0 refills | Status: DC | PRN
  Filled 2022-04-19: qty 18, 16d supply, fill #0

## 2022-04-19 NOTE — Progress Notes (Signed)
Pt to d/c to home today. Pt receives out-pt HD at [facility] on third shift on MWF. Met with pt at bedside. Pt states he does not have transportation to/from HD today. Pt cancelled [facility] this morning and they cannot assist with transportation today for that reason. Pt states that son and mother cannot assist with transportation either. Clinic does not have 3rd shift tomorrow and pt states he has to work tomorrow so pt unable to go to out-pt HD on first or second shift tomorrow. Clinic unable to provide any assistance with transportation except a bus pass which pt cannot use due to his visual impairment. Case discussed with TOC CSW. Pt to be provided a cab voucher to get to clinic from hospital and then a voucher to get from the clinic to home after HD appt. Spoke to [place] at [facility] to make her aware that pt will be at appt this afternoon. Renal PA to send orders to clinic. Pt's RN aware of plan as well. Pt aware of plan and agreeable.   Melven Sartorius Renal Navigator [phone]

## 2022-04-19 NOTE — TOC Transition Note (Addendum)
Transition of Care [facility]) - CM/SW Discharge Note   Patient Details  Name: Blakely Gluth MRN: [id number] Date of Birth: 1970/09/29  [facility] of [facility] CM/SW Contact:  Tom-Johnson, Renea Ee, RN Phone Number: 04/19/2022, 10:04 AM   Clinical Narrative:     Patient is scheduled for discharge today. Hosp f/u scheduled with Internal Medicine and info on AVS.  CM received a call from [place] at [facility] office and states patient's Medicaid expired 11/23/21 and not eligible for transportation at this time. States patient would have to call Medicaid office to reinstate his services. CM notified patient and he states he will give them a call.  Cab voucher given for transportation. No Further TOC needs noted.   15:00- Patient called CM and notified that he called his SW at [facility] office and was told he is still active. CM spoke with Anderson Malta who acknowledges patient is still active but needs assessment for transportation eligibility. States patient is on the queue and someone will give him a call in 24-48 hrs. CM requested if patient could have PCS and Anderson Malta placed him on a queue as well. No further TOC needs noted.     Final next level of care: Home/Self Care Barriers to Discharge: Barriers Resolved   Patient Goals and CMS Choice Patient states their goals for this hospitalization and ongoing recovery are:: To return home CMS Medicare.gov Compare Post Acute Care list provided to:: Patient Choice offered to / list presented to : Patient  Discharge Placement                Patient to be transferred to facility by: [facility]      Discharge Plan and Services In-house Referral: Clinical Social Work (Needs low cost housing resources.)   Post Acute Care Choice: NA          DME Arranged: N/A DME Agency: NA         HH Agency: NA        Social Determinants of Health (SDOH) Interventions     Readmission Risk Interventions     No data to display

## 2022-04-19 NOTE — Progress Notes (Addendum)
[facility] Progress Note   Subjective:   Patient seen and examined at bedside. Reports no way to dialysis today due to being unable to pay $5 for his regular transportation [facility].  Made Renal navigator aware.  Continues to have mild CP.  Denies SOB, edema, orthopnea, and n/v/d.   Objective Vitals:   04/18/22 1752 04/18/22 2223 04/19/22 0233 04/19/22 0817  BP: (!) 164/88 (!) 144/78 (!) 145/78 (!) 151/77  Pulse: 79 72 75 69  Resp: '19 16  18  '$ Temp: 98.5 F (36.9 C) 99.1 F (37.3 C)  98.2 F (36.8 C)  TempSrc: Oral Oral  Oral  SpO2: 100% 99% 99% 96%  Weight:      Height:       Physical Exam General:WDWN male in NAD Heart:RRR, no mrg Lungs:CTAB, nml WOB on RA Abdomen:soft, NTND Extremities:no LE edema Dialysis Access: RU AVF +b/t   Filed Weights   04/16/22 1413  Weight: 97.5 kg    Intake/Output Summary (Last 24 hours) at 04/19/2022 1043 Last data filed at 04/19/2022 0858 Gross per 24 hour  Intake 1207.03 ml  Output 200 ml  Net 1007.03 ml    Additional Objective Labs: Basic Metabolic Panel: Recent Labs  Lab 04/17/22 0720 04/18/22 0425 04/19/22 0804  NA 136 136 138  K 3.3* 3.4* 3.4*  CL 97* 98 101  CO2 '28 26 23  '$ GLUCOSE 110* 105* 181*  BUN 25* 41* 57*  CREATININE 7.21* 9.86* 12.06*  CALCIUM 8.7* 8.4* 8.8*  PHOS 2.5 3.0 3.5   Liver Function Tests: Recent Labs  Lab 04/16/22 1518 04/17/22 0720 04/18/22 0425 04/19/22 0804  AST 18  --   --   --   ALT 13  --   --   --   ALKPHOS 69  --   --   --   BILITOT 0.6  --   --   --   PROT 7.1  --   --   --   ALBUMIN 3.3* 3.1* 2.7* 2.7*   Recent Labs  Lab 04/16/22 1518  LIPASE 50   CBC: Recent Labs  Lab 04/16/22 1518 04/17/22 0850 04/17/22 1400 04/18/22 0425 04/19/22 0804  WBC 10.0 7.4  --  6.6  --   NEUTROABS 8.1* 5.2  --   --   --   HGB 9.5* 8.9* 9.5* 8.6* 8.7*  HCT 27.5* 26.2* 27.8* 24.8* 24.7*  MCV 95.5 96.0  --  95.4  --   PLT 138* 121*  --  126*  --    Blood Culture     Component Value Date/Time   SDES BLOOD SITE NOT SPECIFIED 04/17/2022 0720   SPECREQUEST  04/17/2022 0720    BOTTLES DRAWN AEROBIC AND ANAEROBIC Blood Culture adequate volume   CULT  04/17/2022 0720    NO GROWTH 2 DAYS Performed at [facility], [address], [place], [place] [address]    REPTSTATUS PENDING 04/17/2022 0720     Medications:  cefTRIAXone (ROCEPHIN)  IV 2 g (04/18/22 0903)    azithromycin  500 mg Oral Daily   calcium acetate  1,334 mg Oral TID WC   carvedilol  25 mg Oral BID   Chlorhexidine Gluconate Cloth  6 each Topical Q0600   guaiFENesin  600 mg Oral BID   multivitamin  1 tablet Oral Daily   pantoprazole  40 mg Oral BID   sodium chloride flush  3 mL Intravenous Q12H    Dialysis Orders: MWF - [facility] 3rd shift  4hrs, BFR 450, DFR AF 1.5,,  EDW 98.5kg, 2K/ 2Ca   Access: RU AVF   Heparin 6000 units Mircera 75 mcg q2wks - last 10/18 Calcitriol 0.61mg PO qHD Sensipar '30mg'$  qHD   Assessment/Plan:  CAP - fever on admit, afebrile x 24hrs.  CXR/CTA with possible PNA, on ABX  ESRD -  on HD MWF.  HD today at [facility] center.  SW arranging transportation.  Concern for GIB - Hgb 8.7 here.  FOBT negative. Has been 10.5-11.9 over last few months as outpatient.   Hypertension/volume  - BP variable. Continue home meds.  Does not appear grossly volume overloaded.  CXR with mild vascular congestion. Net UF 3.2L last HD.  Continue to titrate down as tolerated. Try to get standing weight if possible to better assess edw.   Anemia of CKD - Hgb 8.6.  ESA recently dosed.  Secondary Hyperparathyroidism -  Calcium and phos in goal.  Continue VDRA, sensipar and binders.   Nutrition - Renal diet w/fluid restrictions.  Systolic HF - EF improved to 55% on echo.  Pulmonary HTN - noted on echo Dispo - ok for d/c from renal standpoint once transportation to dialysis arranged  LJen Mow PA-C CBeverly Beach10/25/2023,10:43 AM  LOS: 2 days    Seen and examined  independently.  Agree with note and exam as documented above by physician extender and as noted here.  General adult male in bed in no acute distress HEENT normocephalic atraumatic extraocular movements intact sclera anicteric Neck supple trachea midline Lungs clear to auscultation bilaterally normal work of breathing at rest  Heart S1S2 no rub Abdomen soft nontender nondistended Extremities no edema  Psych normal mood and affect Access RUE AVF bruit and thrill   CAP - abx per primary team. Now on ceftriaxone and azithromycin    ESRD - on HD MWF.  Ihe has cab vouchers to get to HD and has a third shift appt today for HD outpatient    HTN - optimize volume with HD   Chest pain - felt may be related to PNA    Dispo per primary team. Discharge summary and discharge order are in - for outpatient HD today  LClaudia Desanctis MD 04/19/2022 1:02 PM

## 2022-04-19 NOTE — Discharge Summary (Signed)
Physician Discharge Summary  Corey Park PTW:[id number] DOB: 1971/01/01 DOA: 04/16/2022  PCP: Default, Provider, MD  Admit date: 04/16/2022 Discharge date: 04/19/2022  Admitted From: Home Disposition: Home  Recommendations for Outpatient Follow-up:  Follow up with PCP in 1-2 weeks Please obtain BMP/CBC in one week your next doctors visit.  Augmentin orally daily to be taken for 5 more days after hemodialysis   Discharge Condition: Stable CODE STATUS: Full code Diet recommendation: Renal  Brief/Interim Summary: 51 y.o. male with medical history significant of hypertension, systolic CHF, PAH, ESRD on HD, diet controlled DM type II, blindness, and history of GI bleed presents with complaints of chest pain. He also reports Shortness of breath and was unable to take deep breath due to chest pain.  He has had dialysis but only completed for 2 hours after getting to his appointment date.  He reports having chronic hiccups, nausea and vomiting secondary to dialysis.  Episodes can last a week and then go away for a week prior to recurring.  He was found to be febrile with a temp of 101.7 on arrival.  BNP 957,  CTA chest showed patchy dependent bibasilar airspace opacities concerning for atelectasis or pneumonia.  His symptoms were initially thought to be due to missed dialysis/fluid overload.  He underwent hemodialysis, however after dialysis he has developed fever and is started on empiric antibiotics.  Patient is admitted for community-acquired pneumonia.  Patient did well with hemodialysis during the hospitalization, following day his breathing was significantly better as well therefore being transitioned to oral antibiotic Augmentin for 5 more days to complete total 7-day course.  Today medically stable for discharge.   Assessment & Plan:   Principal Problem:   Pneumonia Active Problems:   Sepsis ([place])   Acute blood loss anemia   History of GI bleed   Elevated troponin   Atypical chest  pain   ESRD on dialysis ([place])   Chronic systolic heart failure (HCC)   Hypertensive urgency   Nausea and vomiting   Chronic hiccups   Thrombocytopenia (HCC)   DM (diabetes mellitus), type 2 (HCC)   GERD (gastroesophageal reflux disease)   Sepsis secondary to community-acquired pneumonia: Sepsis physiology has improved.  We will transition IV Rocephin and azithromycin to oral Augmentin for 5 more days to complete the course.   Chronic anemia: Patient states he has not had any bloody bowel movement in over last 24 hours.  He may have noticed small amount of blood prior to coming to the hospital.  His hemoglobin is around baseline of 9.0.  There is 1 reading of hemoglobin prior to admission of 14.3 but rest of them are around 9.0.  Wonder if it was air.  Hemoccult in the hospital is negative   Patient previously had GI bleed back in 11/2018. He underwent EGD by Dr. Candis Schatz which noted gastritis, nodules found in the duodenum which were biopsied, and copious frothy sputum suggestive of underlying esophageal motility disorder.  Colonoscopy noted a few small mouth diverticula in the sigmoid colon, large lipoma 15 mm in diameter in the transverse colon, and nonbleeding internal hemorrhoids grade 1.  Follow-up outpatient GI if necessary   Atypical chest pain: Resolved Chronic.  Likely contributed by pneumonia as well CT angiogram negative for PE.  High-sensitivity troponin essentially flat 94-> 96.   EKG with more pronounced T wave changes in the anterior leads. Echo: LVEF 50%, inferior wall hypokinesis   ESRD on hemodialysis: Patient normally dialyzes Monday, Wednesday, and Friday.  Resume outpatient dialysis per nephro   Acute on chronic systolic CHF: Resolved Acute on chronic.  Last echocardiogram noted EF of 40 to 45% back in 11/2020.   BNP was elevated at 957.7. Fluid management with hemodialysis   Essential hypertension: Continue Coreg 25 mg twice daily. Hydralazine as needed    Chronic hiccups: Chronic.  Patient reports intermittent episodes of hiccups lasting several days with nausea and vomiting which he was told no clear cause has been found.  EGD noted concern for the possibility of esophageal dysmotility. -Likely needs to follow-up with GI for further work-up in the outpatient setting.   Thrombocytopenia Chronic.  Outpatient follow-up   Diet-controlled diabetes mellitus type 2 Diet controlled.  Last hemoglobin A1c 6.2 in 11/2019.   GERD Continue pantoprazole 40 mg daily      Discharge Diagnoses:  Principal Problem:   Pneumonia Active Problems:   Sepsis ([place])   Acute blood loss anemia   History of GI bleed   Elevated troponin   Atypical chest pain   ESRD on dialysis ([place])   Chronic systolic heart failure (HCC)   Hypertensive urgency   Nausea and vomiting   Chronic hiccups   Thrombocytopenia (HCC)   DM (diabetes mellitus), type 2 (HCC)   GERD (gastroesophageal reflux disease)      Consultations: Nephrology  Subjective: Feels well no complaints.  Denies any GI bleeding  Discharge Exam: Vitals:   04/19/22 0233 04/19/22 0817  BP: (!) 145/78 (!) 151/77  Pulse: 75 69  Resp:  18  Temp:  98.2 F (36.8 C)  SpO2: 99% 96%   Vitals:   04/18/22 1752 04/18/22 2223 04/19/22 0233 04/19/22 0817  BP: (!) 164/88 (!) 144/78 (!) 145/78 (!) 151/77  Pulse: 79 72 75 69  Resp: '19 16  18  '$ Temp: 98.5 F (36.9 C) 99.1 F (37.3 C)  98.2 F (36.8 C)  TempSrc: Oral Oral  Oral  SpO2: 100% 99% 99% 96%  Weight:      Height:        General: Pt is alert, awake, not in acute distress Cardiovascular: RRR, S1/S2 +, no rubs, no gallops Respiratory: CTA bilaterally, no wheezing, no rhonchi Abdominal: Soft, NT, ND, bowel sounds + Extremities: no edema, no cyanosis  Discharge Instructions   Allergies as of 04/19/2022   No Known Allergies      Medication List     TAKE these medications    acetaminophen 500 MG tablet Commonly known as:  TYLENOL Take 1,000 mg by mouth every 6 (six) hours as needed for mild pain.   amoxicillin-clavulanate 500-125 MG tablet Commonly known as: Augmentin Take 1 tablet by mouth daily in the afternoon for 5 days.   calcium acetate 667 MG tablet Commonly known as: PHOSLO Take 1,334 mg by mouth 3 (three) times daily.   chlorproMAZINE 25 MG tablet Commonly known as: THORAZINE TAKE 1 TABLET BY MOUTH THREE TIMES DAILY   Coreg 25 MG tablet Generic drug: carvedilol Take 25 mg by mouth 2 (two) times daily.   DIPHENHYDRAMINE HCL PO Take 25 mg by mouth at bedtime as needed (sleep).   gabapentin 100 MG capsule Commonly known as: NEURONTIN Take 100 mg by mouth 3 (three) times daily as needed (nerve pain).   IRON SUCROSE IV Inject 1 application as directed every Monday, Wednesday, and Friday. Done at dialysis   multivitamin Tabs tablet Take 1 tablet by mouth daily.   pantoprazole 20 MG tablet Commonly known as: PROTONIX Take 20 mg by mouth  daily as needed for heartburn or indigestion.   Ventolin HFA 108 (90 Base) MCG/ACT inhaler Generic drug: albuterol Inhale 2 puffs into the lungs every 6 (six) hours as needed for wheezing or shortness of breath.        No Known Allergies  You were cared for by a hospitalist during your hospital stay. If you have any questions about your discharge medications or the care you received while you were in the hospital after you are discharged, you can call the unit and asked to speak with the hospitalist on call if the hospitalist that took care of you is not available. Once you are discharged, your primary care physician will handle any further medical issues. Please note that no refills for any discharge medications will be authorized once you are discharged, as it is imperative that you return to your primary care physician (or establish a relationship with a primary care physician if you do not have one) for your aftercare needs so that they can reassess  your need for medications and monitor your lab values.   Procedures/Studies: ECHOCARDIOGRAM COMPLETE  Result Date: 04/17/2022    ECHOCARDIOGRAM REPORT   Patient Name:   Corey Park Date of Exam: 04/17/2022 Medical Rec #:  [id number]       Height:       73.0 in Accession #:    [id number]      Weight:       215.0 lb Date of Birth:  Apr 12, 1971       BSA:          2.219 m Patient Age:    51 years        BP:           133/93 mmHg Patient Gender: M               HR:           80 bpm. Exam Location:  Inpatient Procedure: 2D Echo, 3D Echo, Cardiac Doppler and Color Doppler Indications:    Chest pain  History:        Patient has prior history of Echocardiogram examinations. CHF                 and Cardiomyopathy, Pulmonary HTN,                 Signs/Symptoms:Nausea/Vomiting; Risk Factors:Hypertension,                 Diabetes and Family History of Coronary Artery Disease. ESRD.  Sonographer:    Eartha Inch Referring Phys: [id number] RONDELL A SMITH IMPRESSIONS  1. Left ventricular ejection fraction, by estimation, is 55%. The left ventricle has normal function. The left ventricle demonstrates regional wall motion abnormalities with basal inferior hypokinesis. There is mild concentric left ventricular hypertrophy. Left ventricular diastolic parameters are consistent with Grade II diastolic dysfunction (pseudonormalization).  2. Right ventricular systolic function is normal. The right ventricular size is normal. Tricuspid regurgitation signal is inadequate for assessing PA pressure.  3. The mitral valve is normal in structure. Trivial mitral valve regurgitation. No evidence of mitral stenosis.  4. The aortic valve is tricuspid. Aortic valve regurgitation is not visualized. No aortic stenosis is present.  5. The inferior vena cava is normal in size with greater than 50% respiratory variability, suggesting right atrial pressure of 3 mmHg. FINDINGS  Left Ventricle: Left ventricular ejection fraction, by estimation, is  55%. The left ventricle has normal function. The left ventricle demonstrates regional wall motion  abnormalities. The left ventricular internal cavity size was normal in size. There is  mild concentric left ventricular hypertrophy. Left ventricular diastolic parameters are consistent with Grade II diastolic dysfunction (pseudonormalization). Right Ventricle: The right ventricular size is normal. No increase in right ventricular wall thickness. Right ventricular systolic function is normal. Tricuspid regurgitation signal is inadequate for assessing PA pressure. Left Atrium: Left atrial size was normal in size. Right Atrium: Right atrial size was normal in size. Pericardium: There is no evidence of pericardial effusion. Mitral Valve: The mitral valve is normal in structure. Trivial mitral valve regurgitation. No evidence of mitral valve stenosis. MV peak gradient, 5.0 mmHg. The mean mitral valve gradient is 3.0 mmHg. Tricuspid Valve: The tricuspid valve is normal in structure. Tricuspid valve regurgitation is not demonstrated. Aortic Valve: The aortic valve is tricuspid. Aortic valve regurgitation is not visualized. No aortic stenosis is present. Pulmonic Valve: The pulmonic valve was normal in structure. Pulmonic valve regurgitation is trivial. Aorta: The aortic root is normal in size and structure. Venous: The inferior vena cava is normal in size with greater than 50% respiratory variability, suggesting right atrial pressure of 3 mmHg. IAS/Shunts: No atrial level shunt detected by color flow Doppler.  LEFT VENTRICLE PLAX 2D LVIDd:         5.00 cm   Diastology LVIDs:         3.70 cm   LV e' medial:    6.83 cm/s LV PW:         1.40 cm   LV E/e' medial:  15.2 LV IVS:        1.30 cm   LV e' lateral:   7.27 cm/s LVOT diam:     2.30 cm   LV E/e' lateral: 14.3 LV SV:         106 LV SV Index:   48 LVOT Area:     4.15 cm  RIGHT VENTRICLE             IVC RV S prime:     17.10 cm/s  IVC diam: 1.80 cm TAPSE (M-mode): 2.1 cm  LEFT ATRIUM             Index        RIGHT ATRIUM           Index LA diam:        3.80 cm 1.71 cm/m   RA Area:     12.50 cm LA Vol (A2C):   58.7 ml 26.46 ml/m  RA Volume:   28.50 ml  12.84 ml/m LA Vol (A4C):   51.6 ml 23.26 ml/m LA Biplane Vol: 55.8 ml 25.15 ml/m  AORTIC VALVE             PULMONIC VALVE LVOT Vmax:   126.00 cm/s PR End Diast Vel: 3.61 msec LVOT Vmean:  95.000 cm/s LVOT VTI:    0.254 m  AORTA Ao Root diam: 3.60 cm Ao Asc diam:  3.70 cm MITRAL VALVE MV Area (PHT): 4.33 cm     SHUNTS MV Area VTI:   3.88 cm     Systemic VTI:  0.25 m MV Peak grad:  5.0 mmHg     Systemic Diam: 2.30 cm MV Mean grad:  3.0 mmHg MV Vmax:       1.12 m/s MV Vmean:      79.3 cm/s MV Decel Time: 175 msec MV E velocity: 104.00 cm/s MV A velocity: 71.90 cm/s MV E/A ratio:  1.45 [employer] Electronically signed by  Dalton McleanMD Signature Date/Time: 04/17/2022/3:14:41 PM    Final    CT Angio Chest PE W and/or Wo Contrast  Result Date: 04/16/2022 CLINICAL DATA:  Pulmonary embolism (PE) suspected, high prob EXAM: CT ANGIOGRAPHY CHEST WITH CONTRAST TECHNIQUE: Multidetector CT imaging of the chest was performed using the standard protocol during bolus administration of intravenous contrast. Multiplanar CT image reconstructions and MIPs were obtained to evaluate the vascular anatomy. RADIATION DOSE REDUCTION: This exam was performed according to the departmental dose-optimization program which includes automated exposure control, adjustment of the mA and/or kV according to patient size and/or use of iterative reconstruction technique. CONTRAST:  80 mL Omnipaque 350 IV contrast COMPARISON:  Same day chest x-ray.  CT 03/07/2021 FINDINGS: Cardiovascular: Satisfactory opacification of the pulmonary arteries to the segmental level although evaluation of the lung bases is degraded by respiratory motion artifact. No evidence of pulmonary embolism. Pulmonary trunk is mildly dilated at 3.6 cm. Thoracic aorta is nonaneurysmal.  Scattered atherosclerotic vascular calcifications of the aorta and coronary arteries. Mild cardiomegaly. No pericardial effusion. Mediastinum/Nodes: No enlarged mediastinal, hilar, or axillary lymph nodes. Thyroid gland, trachea, and esophagus demonstrate no significant findings. Lungs/Pleura: Shallow inspiration. Patchy airspace consolidations within the dependent portions of the bilateral lower lobes. Upper lung fields are clear. No pleural effusion. No pneumothorax. Upper Abdomen: No acute abnormality. Musculoskeletal: No chest wall abnormality. No acute or significant osseous findings. Review of the MIP images confirms the above findings. IMPRESSION: 1. No evidence of pulmonary embolism. 2. Low lung volumes with patchy dependent bibasilar airspace consolidations, which may represent atelectasis and/or pneumonia. 3. Mild cardiomegaly. 4. Mildly dilated main pulmonary trunk, which can be seen in the setting of pulmonary arterial hypertension. 5. Aortic and coronary artery atherosclerosis (ICD10-I70.0). Electronically Signed   By: Davina Poke D.O.   On: 04/16/2022 17:48   DG Chest Port 1 View  Result Date: 04/16/2022 CLINICAL DATA:  Chest pain. EXAM: PORTABLE CHEST 1 VIEW COMPARISON:  March 20, 2022. FINDINGS: Mild cardiomegaly is noted with mild central pulmonary vascular congestion. Minimal bibasilar subsegmental atelectasis or edema is noted. Bony thorax is unremarkable. IMPRESSION: Mild cardiomegaly with mild central pulmonary vascular congestion. Minimal bibasilar subsegmental atelectasis or edema is noted. Electronically Signed   By: Marijo Conception M.D.   On: 04/16/2022 16:09   DG Chest Port 1 View  Result Date: 03/20/2022 CLINICAL DATA:  Chest pain EXAM: PORTABLE CHEST 1 VIEW COMPARISON:  None Available. FINDINGS: Patient is rotated to the right. Heart size and mediastinal contours are within normal limits. Both lungs are clear. Possible trace right pleural effusion. No evidence of  pneumothorax. IMPRESSION: 1. No consolidation. 2. Possible trace right pleural effusion. Electronically Signed   By: Yetta Glassman M.D.   On: 03/20/2022 18:44     The results of significant diagnostics from this hospitalization (including imaging, microbiology, ancillary and laboratory) are listed below for reference.     Microbiology: Recent Results (from the past 240 hour(s))  Resp Panel by RT-PCR (Flu A&B, Covid) Anterior Nasal Swab     Status: None   Collection Time: 04/17/22  5:17 AM   Specimen: Anterior Nasal Swab  Result Value Ref Range Status   SARS Coronavirus 2 by RT PCR NEGATIVE NEGATIVE Final    Comment: (NOTE) SARS-CoV-2 target nucleic acids are NOT DETECTED.  The SARS-CoV-2 RNA is generally detectable in upper respiratory specimens during the acute phase of infection. The lowest concentration of SARS-CoV-2 viral copies this assay can detect is 138 copies/mL. A negative  result does not preclude SARS-Cov-2 infection and should not be used as the sole basis for treatment or other patient management decisions. A negative result may occur with  improper specimen collection/handling, submission of specimen other than nasopharyngeal swab, presence of viral mutation(s) within the areas targeted by this assay, and inadequate number of viral copies(<138 copies/mL). A negative result must be combined with clinical observations, patient history, and epidemiological information. The expected result is Negative.  Fact Sheet for Patients:  EntrepreneurPulse.com.au  Fact Sheet for Healthcare Providers:  IncredibleEmployment.be  This test is no t yet approved or cleared by the [place] FDA and  has been authorized for detection and/or diagnosis of SARS-CoV-2 by FDA under an Emergency Use Authorization (EUA). This EUA will remain  in effect (meaning this test can be used) for the duration of the COVID-19 declaration under Section 564(b)(1)  of the Act, 21 U.S.C.section 360bbb-3(b)(1), unless the authorization is terminated  or revoked sooner.       Influenza A by PCR NEGATIVE NEGATIVE Final   Influenza B by PCR NEGATIVE NEGATIVE Final    Comment: (NOTE) The Xpert Xpress SARS-CoV-2/FLU/RSV plus assay is intended as an aid in the diagnosis of influenza from Nasopharyngeal swab specimens and should not be used as a sole basis for treatment. Nasal washings and aspirates are unacceptable for Xpert Xpress SARS-CoV-2/FLU/RSV testing.  Fact Sheet for Patients: EntrepreneurPulse.com.au  Fact Sheet for Healthcare Providers: IncredibleEmployment.be  This test is not yet approved or cleared by the [place] FDA and has been authorized for detection and/or diagnosis of SARS-CoV-2 by FDA under an Emergency Use Authorization (EUA). This EUA will remain in effect (meaning this test can be used) for the duration of the COVID-19 declaration under Section 564(b)(1) of the Act, 21 U.S.C. section 360bbb-3(b)(1), unless the authorization is terminated or revoked.  Performed at [facility], [place] [address], [place], [place] [address]   Blood culture (routine x 2)     Status: None (Preliminary result)   Collection Time: 04/17/22  7:14 AM   Specimen: BLOOD  Result Value Ref Range Status   Specimen Description BLOOD SITE NOT SPECIFIED  Final   Special Requests   Final    BOTTLES DRAWN AEROBIC AND ANAEROBIC Blood Culture adequate volume   Culture   Final    NO GROWTH 2 DAYS Performed at [facility], [address], [place], [place] [address]    Report Status PENDING  Incomplete  Blood culture (routine x 2)     Status: None (Preliminary result)   Collection Time: 04/17/22  7:20 AM   Specimen: BLOOD  Result Value Ref Range Status   Specimen Description BLOOD SITE NOT SPECIFIED  Final   Special Requests   Final    BOTTLES DRAWN AEROBIC AND ANAEROBIC Blood Culture adequate volume    Culture   Final    NO GROWTH 2 DAYS Performed at [facility], [place] [address], [place], [place] [address]    Report Status PENDING  Incomplete     Labs: BNP (last 3 results) Recent Labs    03/20/22 1838 04/16/22 1518  BNP 1,519.4* 601.0*   Basic Metabolic Panel: Recent Labs  Lab 04/16/22 1516 04/16/22 1518 04/17/22 0720 04/18/22 0425 04/19/22 0804  NA 137  137 135 136 136 138  K 3.8  3.8 3.6 3.3* 3.4* 3.4*  CL 100 102 97* 98 101  CO2  --  '23 28 26 23  '$ GLUCOSE 113* 115* 110*  105* 181*  BUN 43* 41* 25* 41* 57*  CREATININE 11.80* 10.72* 7.21* 9.86* 12.06*  CALCIUM  --  9.1 8.7* 8.4* 8.8*  MG  --   --   --  1.7  --   PHOS  --   --  2.5 3.0 3.5   Liver Function Tests: Recent Labs  Lab 04/16/22 1518 04/17/22 0720 04/18/22 0425 04/19/22 0804  AST 18  --   --   --   ALT 13  --   --   --   ALKPHOS 69  --   --   --   BILITOT 0.6  --   --   --   PROT 7.1  --   --   --   ALBUMIN 3.3* 3.1* 2.7* 2.7*   Recent Labs  Lab 04/16/22 1518  LIPASE 50   No results for input(s): "AMMONIA" in the last 168 hours. CBC: Recent Labs  Lab 04/16/22 1518 04/17/22 0850 04/17/22 1400 04/18/22 0425 04/19/22 0804  WBC 10.0 7.4  --  6.6  --   NEUTROABS 8.1* 5.2  --   --   --   HGB 9.5* 8.9* 9.5* 8.6* 8.7*  HCT 27.5* 26.2* 27.8* 24.8* 24.7*  MCV 95.5 96.0  --  95.4  --   PLT 138* 121*  --  126*  --    Cardiac Enzymes: No results for input(s): "CKTOTAL", "CKMB", "CKMBINDEX", "TROPONINI" in the last 168 hours. BNP: Invalid input(s): "POCBNP" CBG: No results for input(s): "GLUCAP" in the last 168 hours. D-Dimer No results for input(s): "DDIMER" in the last 72 hours. Hgb A1c No results for input(s): "HGBA1C" in the last 72 hours. Lipid Profile No results for input(s): "CHOL", "HDL", "LDLCALC", "TRIG", "CHOLHDL", "LDLDIRECT" in the last 72 hours. Thyroid function studies No results for input(s): "TSH", "T4TOTAL", "T3FREE", "THYROIDAB" in the last 72 hours.  Invalid  input(s): "FREET3" Anemia work up No results for input(s): "VITAMINB12", "FOLATE", "FERRITIN", "TIBC", "IRON", "RETICCTPCT" in the last 72 hours. Urinalysis    Component Value Date/Time   COLORURINE YELLOW 04/17/2022 1916   APPEARANCEUR CLEAR 04/17/2022 1916   LABSPEC 1.015 04/17/2022 1916   PHURINE 7.0 04/17/2022 1916   GLUCOSEU 50 (A) 04/17/2022 1916   HGBUR SMALL (A) 04/17/2022 1916   BILIRUBINUR NEGATIVE 04/17/2022 1916   KETONESUR NEGATIVE 04/17/2022 1916   PROTEINUR 100 (A) 04/17/2022 1916   NITRITE NEGATIVE 04/17/2022 1916   LEUKOCYTESUR TRACE (A) 04/17/2022 1916   Sepsis Labs Recent Labs  Lab 04/16/22 1518 04/17/22 0850 04/18/22 0425  WBC 10.0 7.4 6.6   Microbiology Recent Results (from the past 240 hour(s))  Resp Panel by RT-PCR (Flu A&B, Covid) Anterior Nasal Swab     Status: None   Collection Time: 04/17/22  5:17 AM   Specimen: Anterior Nasal Swab  Result Value Ref Range Status   SARS Coronavirus 2 by RT PCR NEGATIVE NEGATIVE Final    Comment: (NOTE) SARS-CoV-2 target nucleic acids are NOT DETECTED.  The SARS-CoV-2 RNA is generally detectable in upper respiratory specimens during the acute phase of infection. The lowest concentration of SARS-CoV-2 viral copies this assay can detect is 138 copies/mL. A negative result does not preclude SARS-Cov-2 infection and should not be used as the sole basis for treatment or other patient management decisions. A negative result may occur with  improper specimen collection/handling, submission of specimen other than nasopharyngeal swab, presence of viral mutation(s) within the areas targeted by this assay, and inadequate number of viral copies(<138 copies/mL). A  negative result must be combined with clinical observations, patient history, and epidemiological information. The expected result is Negative.  Fact Sheet for Patients:  EntrepreneurPulse.com.au  Fact Sheet for Healthcare Providers:   IncredibleEmployment.be  This test is no t yet approved or cleared by the [place] FDA and  has been authorized for detection and/or diagnosis of SARS-CoV-2 by FDA under an Emergency Use Authorization (EUA). This EUA will remain  in effect (meaning this test can be used) for the duration of the COVID-19 declaration under Section 564(b)(1) of the Act, 21 U.S.C.section 360bbb-3(b)(1), unless the authorization is terminated  or revoked sooner.       Influenza A by PCR NEGATIVE NEGATIVE Final   Influenza B by PCR NEGATIVE NEGATIVE Final    Comment: (NOTE) The Xpert Xpress SARS-CoV-2/FLU/RSV plus assay is intended as an aid in the diagnosis of influenza from Nasopharyngeal swab specimens and should not be used as a sole basis for treatment. Nasal washings and aspirates are unacceptable for Xpert Xpress SARS-CoV-2/FLU/RSV testing.  Fact Sheet for Patients: EntrepreneurPulse.com.au  Fact Sheet for Healthcare Providers: IncredibleEmployment.be  This test is not yet approved or cleared by the [place] FDA and has been authorized for detection and/or diagnosis of SARS-CoV-2 by FDA under an Emergency Use Authorization (EUA). This EUA will remain in effect (meaning this test can be used) for the duration of the COVID-19 declaration under Section 564(b)(1) of the Act, 21 U.S.C. section 360bbb-3(b)(1), unless the authorization is terminated or revoked.  Performed at [facility], [place] [address], [place], [place] [address]   Blood culture (routine x 2)     Status: None (Preliminary result)   Collection Time: 04/17/22  7:14 AM   Specimen: BLOOD  Result Value Ref Range Status   Specimen Description BLOOD SITE NOT SPECIFIED  Final   Special Requests   Final    BOTTLES DRAWN AEROBIC AND ANAEROBIC Blood Culture adequate volume   Culture   Final    NO GROWTH 2 DAYS Performed at [facility], [address],  [place], [place] [address]    Report Status PENDING  Incomplete  Blood culture (routine x 2)     Status: None (Preliminary result)   Collection Time: 04/17/22  7:20 AM   Specimen: BLOOD  Result Value Ref Range Status   Specimen Description BLOOD SITE NOT SPECIFIED  Final   Special Requests   Final    BOTTLES DRAWN AEROBIC AND ANAEROBIC Blood Culture adequate volume   Culture   Final    NO GROWTH 2 DAYS Performed at [facility], [place] [address] [place], [place] [address]    Report Status PENDING  Incomplete     Time coordinating discharge:  I have spent 35 minutes face to face with the patient and on the ward discussing the patients care, assessment, plan and disposition with other care givers. >50% of the time was devoted counseling the patient about the risks and benefits of treatment/Discharge disposition and coordinating care.   SIGNED:   Damita Lack, MD  [facility] 04/19/2022, 12:01 PM   If 7PM-7AM, please contact night-coverage

## 2022-04-19 NOTE — Progress Notes (Signed)
DISCHARGE NOTE HOME Corey Park to be discharged Home per MD order. Discussed prescriptions and follow up appointments with the patient. Prescriptions given to patient; medication list explained in detail. Patient verbalized understanding.  Skin clean, dry and intact without evidence of skin break down, no evidence of skin tears noted. IV catheter discontinued intact. Site without signs and symptoms of complications. Dressing and pressure applied. Pt denies pain at the site currently. No complaints noted.  Patient free of lines, drains, and wounds.   An After Visit Summary (AVS) was printed and given to the patient. Patient escorted via wheelchair, and discharged home via [employer].  Majorie Santee S Tarin Navarez, RN

## 2022-04-20 LAB — HEPATITIS B SURFACE ANTIBODY, QUANTITATIVE: Hep B S AB Quant (Post): 162.7 m[IU]/mL (ref >9.9)

## 2022-04-20 NOTE — Telephone Encounter (Signed)
Transition of care contact from inpatient facility  Date of discharge: 04/19/22 Date of contact: 04/20/22 Method: Phone Spoke to: Patient  Patient contacted to discuss transition of care from recent inpatient hospitalization. Patient was admitted to [facility] from10/22-25/2023 ... with discharge diagnosis of Sepsis CAP, Atypical CP resolved ...  Medication changes were reviewed.  Patient will follow up with his/her outpatient HD unit on: 04/21/[facility]

## 2022-04-22 LAB — CULTURE, BLOOD (ROUTINE X 2)
Culture: NO GROWTH
Culture: NO GROWTH
Special Requests: ADEQUATE
Special Requests: ADEQUATE

## 2022-05-16 ENCOUNTER — Ambulatory Visit: Payer: Medicare Other | Admitting: Gastroenterology

## 2022-07-11 ENCOUNTER — Encounter: Payer: Self-pay | Admitting: Student

## 2022-07-11 ENCOUNTER — Ambulatory Visit (INDEPENDENT_AMBULATORY_CARE_PROVIDER_SITE_OTHER): Payer: Medicare Other | Admitting: Student

## 2022-07-11 VITALS — BP 163/85 | HR 67 | Ht 73.0 in | Wt 223.2 lb

## 2022-07-11 DIAGNOSIS — I5042 Chronic combined systolic (congestive) and diastolic (congestive) heart failure: Secondary | ICD-10-CM

## 2022-07-11 DIAGNOSIS — Z0000 Encounter for general adult medical examination without abnormal findings: Secondary | ICD-10-CM | POA: Diagnosis not present

## 2022-07-11 DIAGNOSIS — I110 Hypertensive heart disease with heart failure: Secondary | ICD-10-CM | POA: Diagnosis not present

## 2022-07-11 DIAGNOSIS — R066 Hiccough: Secondary | ICD-10-CM | POA: Diagnosis not present

## 2022-07-11 HISTORY — DX: Encounter for general adult medical examination without abnormal findings: Z00.00

## 2022-07-11 NOTE — Assessment & Plan Note (Signed)
Last saw cardiology over a year ago, was recommended he have yearly follow-up but did not follow-up in 2023. - Patient will call for appointment, he will have family members assist him

## 2022-07-11 NOTE — Assessment & Plan Note (Signed)
Longstanding history of chronic hiccups.  Last EGD was 1 year ago, showed H. pylori-received treatment but had never completed test of cure per chart review.  Additionally there was some evidence that patient may have esophageal dysmotility.  He needs follow-up with GI. - Referral to GI sent

## 2022-07-11 NOTE — Progress Notes (Signed)
Subjective:    Patient ID: Corey Park, male    DOB: 1970/10/06, 52 y.o.   MRN: [id number]   CC: New Patient Is seeking personal care services. His dialysis center recommended this. [address], MWF for ESRD. Has medicare and medicaid. Reports new medication prescribed by nephrologist at dialysis center, does not know name and has not picked it up. 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 Needs GI referral for chronic hiccups and EGD that showed possible dysmotility. Had H-pylori, could not find test of cure results.  HPI: PMHx: Past Medical History:  Diagnosis Date   Acute CHF (congestive heart failure) ([place]) 07/27/2019   AKI (acute kidney injury) ([place]) 12/11/2016   Allergy, unspecified, initial encounter 08/25/2019   Anemia 2021   Anesthesia of skin 03/31/2020   Asthma    as a child   Blind    Cellulitis, perineum 11/26/2019   CHF (congestive heart failure) ([place]) 2021   Chronic kidney disease, stage V ([place]) 06/17/2018   Chronic kidney disease, stage V ([place]) 70/17/7939   Complication of vascular dialysis catheter 08/25/2019   COVID-19 virus infection 07/27/2019   Diabetes mellitus without complication ([place])    type 2   Dilated cardiomyopathy ([place]) 07/03/2018   Elevated troponin    Encounter for removal of sutures 08/04/2020   Fe deficiency anemia 12/23/2018   Fluid overload 12/11/2016   Hypertension    Hypertensive urgency 12/11/2016   Hypomagnesemia 12/13/2016   Legally blind    B/L   Pain, unspecified 10/17/2019   Pneumonia 2021   Renal disorder    dialysis T-TH-Sat   Shortness of breath 11/10/2019   Symptomatic anemia 12/21/2018   Type 2 diabetes mellitus with diabetic peripheral angiopathy without gangrene ([place]) 08/25/2019   Unspecified protein-calorie malnutrition ([place]) 08/25/2019   Surgical Hx: Past Surgical History:  Procedure Laterality Date   BASCILIC VEIN TRANSPOSITION Right 10/16/2019   Procedure:  Basilic Vein Transposition Right Arm;  Surgeon: Serafina Mitchell, MD;  Location: [facility];  Service: Vascular;  Laterality: Right;   [place] Right 01/29/2020   Procedure: RIGHT ARM SECOND STAGE [place];  Surgeon: Serafina Mitchell, MD;  Location: [facility];  Service: Vascular;  Laterality: Right;   BIOPSY  12/22/2018   Procedure: BIOPSY;  Surgeon: Laurence Spates, MD;  Location: [facility];  Service: Endoscopy;;   ESOPHAGOGASTRODUODENOSCOPY N/A 12/22/2018   Procedure: ESOPHAGOGASTRODUODENOSCOPY (EGD);  Surgeon: Laurence Spates, MD;  Location: [facility] ENDOSCOPY;  Service: Endoscopy;  Laterality: N/A;   IR FLUORO GUIDE CV LINE RIGHT  07/29/2019   IR US GUIDE VASC ACCESS RIGHT  07/29/2019   Family Hx: Family History  Problem Relation Age of Onset   CAD Mother    Hypertension Mother    Diabetes Neg Hx    Stroke Neg Hx    Cancer Neg Hx    Kidney failure Neg Hx    Stomach cancer Neg Hx    Colon cancer Neg Hx    Rectal cancer Neg Hx    Social Hx: Current Social History 07/11/2022  Alcohol: None Tobacco: Quit 3 years ago, 30 years, .25 packs per day Illicit drug use: Used to smoke marijuana (not currently) Who lives at home: Son stays with him during holidays Who would speak for you about health care matters:  Jearld Pies, pastor, [phone] Mother, Alvino Chapel, [phone] Living will or Advance Directive: Needs to get one Transportation: Bus Important Relationships & Pets: No  pets Current Stressors: None 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 Light exercise.  Medications: Carvedilol  25 mg BID Gabapentin 100 mg TID  Preventative Screening Colonoscopy: Had one in 02/2021 Echocardiogram: 04/17/2022, LVEF 55%  Smoking status reviewed  Objective:  BP (!) 163/85   Pulse 67   Ht '6\' 1"'$  (1.854 m)   Wt 223 lb 3.2 oz (101.2 kg)   SpO2 100%   BMI 29.45 kg/m   Vitals and nursing note reviewed  General: well nourished, in no acute distress HEENT: normocephalic, TM's visualized bilaterally, no scleral icterus or conjunctival pallor, no nasal discharge, moist mucous membranes, good dentition without erythema or discharge noted in posterior oropharynx Neck: supple, non-tender, without lymphadenopathy Cardiac: RRR, clear S1 and S2, no murmurs, rubs, or gallops Respiratory: clear to auscultation bilaterally, no increased work of breathing Abdomen: soft, nontender, nondistended, no masses or organomegaly. Bowel sounds present Extremities: no edema or cyanosis. Warm, well perfused. 2+ radial and PT pulses bilaterally Skin: warm and dry, no rashes noted Neuro: alert and oriented, no focal deficits  Assessment & Plan:   Chronic hiccups Longstanding history of chronic hiccups.  Last EGD was 1 year ago, showed H. pylori-received treatment but had never completed test of cure per chart review.  Additionally there was some evidence that patient may have esophageal dysmotility.  He needs follow-up with GI. - Referral to GI sent  Hypertensive heart disease with CHF (congestive heart failure) ([place]) Last saw cardiology over a year ago, was recommended he have yearly follow-up but did not follow-up in 2023. - Patient will call for appointment, he will have family members assist him  Adult general medical exam Patient establishes care today.  Reviewed past medical history, surgical history and medications.  Currently only taking carvedilol and gabapentin.  Goes to dialysis Monday Wednesday Friday for ESRD-followed closely by nephrology there.  Has high blood pressure, but currently managed by nephrology-will defer to their expertise.  Patient reports having Medicaid and would like to pursue personal care services-we will work towards completing this paperwork today. - Personal care services paperwork - Recommend follow-up in 2 to 4 weeks to discuss healthcare  gaps  Due to patient needing ride, he was unable to stay for to complete visit and did not receive AVS.  On his way out we discussed making appointment in 2 to 4 weeks for follow-up.  Return in about 4 weeks (around 08/08/2022) for Healthcare maintenance.  Leslie Dales, DO PGY-1

## 2022-07-11 NOTE — Assessment & Plan Note (Signed)
Patient establishes care today.  Reviewed past medical history, surgical history and medications.  Currently only taking carvedilol and gabapentin.  Goes to dialysis Monday Wednesday Friday for ESRD-followed closely by nephrology there.  Has high blood pressure, but currently managed by nephrology-will defer to their expertise.  Patient reports having Medicaid and would like to pursue personal care services-we will work towards completing this paperwork today. - Personal care services paperwork - Recommend follow-up in 2 to 4 weeks to discuss healthcare gaps

## 2022-08-04 ENCOUNTER — Inpatient Hospital Stay (HOSPITAL_COMMUNITY)
Admission: EM | Admit: 2022-08-04 | Discharge: 2022-08-08 | DRG: 313 | Disposition: A | Discharge status: Home / Self Care | Payer: Medicare Other | Attending: Family Medicine | Admitting: Family Medicine

## 2022-08-04 DIAGNOSIS — K5900 Constipation, unspecified: Secondary | ICD-10-CM | POA: Diagnosis present

## 2022-08-04 DIAGNOSIS — I5A Non-ischemic myocardial injury (non-traumatic): Secondary | ICD-10-CM

## 2022-08-04 DIAGNOSIS — I2510 Atherosclerotic heart disease of native coronary artery without angina pectoris: Secondary | ICD-10-CM | POA: Diagnosis present

## 2022-08-04 DIAGNOSIS — R079 Chest pain, unspecified: Principal | ICD-10-CM

## 2022-08-04 DIAGNOSIS — Z8249 Family history of ischemic heart disease and other diseases of the circulatory system: Secondary | ICD-10-CM

## 2022-08-04 DIAGNOSIS — R042 Hemoptysis: Secondary | ICD-10-CM | POA: Diagnosis present

## 2022-08-04 DIAGNOSIS — I420 Dilated cardiomyopathy: Secondary | ICD-10-CM | POA: Diagnosis present

## 2022-08-04 DIAGNOSIS — E1122 Type 2 diabetes mellitus with diabetic chronic kidney disease: Secondary | ICD-10-CM | POA: Diagnosis present

## 2022-08-04 DIAGNOSIS — Z91158 Patient's noncompliance with renal dialysis for other reason: Secondary | ICD-10-CM

## 2022-08-04 DIAGNOSIS — R066 Hiccough: Secondary | ICD-10-CM | POA: Diagnosis present

## 2022-08-04 DIAGNOSIS — E1151 Type 2 diabetes mellitus with diabetic peripheral angiopathy without gangrene: Secondary | ICD-10-CM | POA: Diagnosis present

## 2022-08-04 DIAGNOSIS — R0789 Other chest pain: Secondary | ICD-10-CM | POA: Diagnosis not present

## 2022-08-04 DIAGNOSIS — Z8616 Personal history of COVID-19: Secondary | ICD-10-CM

## 2022-08-04 DIAGNOSIS — N186 End stage renal disease: Secondary | ICD-10-CM

## 2022-08-04 DIAGNOSIS — Z79899 Other long term (current) drug therapy: Secondary | ICD-10-CM

## 2022-08-04 DIAGNOSIS — R000 Tachycardia, unspecified: Secondary | ICD-10-CM | POA: Diagnosis present

## 2022-08-04 DIAGNOSIS — H548 Legal blindness, as defined in USA: Secondary | ICD-10-CM | POA: Diagnosis present

## 2022-08-04 DIAGNOSIS — I10 Essential (primary) hypertension: Secondary | ICD-10-CM | POA: Diagnosis present

## 2022-08-04 DIAGNOSIS — J45909 Unspecified asthma, uncomplicated: Secondary | ICD-10-CM | POA: Diagnosis present

## 2022-08-04 DIAGNOSIS — Z742 Need for assistance at home and no other household member able to render care: Secondary | ICD-10-CM

## 2022-08-04 DIAGNOSIS — N2581 Secondary hyperparathyroidism of renal origin: Secondary | ICD-10-CM | POA: Diagnosis present

## 2022-08-04 DIAGNOSIS — R7989 Other specified abnormal findings of blood chemistry: Secondary | ICD-10-CM | POA: Diagnosis present

## 2022-08-04 DIAGNOSIS — I5030 Unspecified diastolic (congestive) heart failure: Secondary | ICD-10-CM

## 2022-08-04 DIAGNOSIS — K920 Hematemesis: Secondary | ICD-10-CM | POA: Diagnosis present

## 2022-08-04 DIAGNOSIS — I5032 Chronic diastolic (congestive) heart failure: Secondary | ICD-10-CM | POA: Diagnosis present

## 2022-08-04 DIAGNOSIS — I132 Hypertensive heart and chronic kidney disease with heart failure and with stage 5 chronic kidney disease, or end stage renal disease: Secondary | ICD-10-CM | POA: Diagnosis present

## 2022-08-04 DIAGNOSIS — Z7982 Long term (current) use of aspirin: Secondary | ICD-10-CM

## 2022-08-04 DIAGNOSIS — D649 Anemia, unspecified: Secondary | ICD-10-CM | POA: Diagnosis present

## 2022-08-04 DIAGNOSIS — Z992 Dependence on renal dialysis: Secondary | ICD-10-CM

## 2022-08-04 DIAGNOSIS — E785 Hyperlipidemia, unspecified: Secondary | ICD-10-CM | POA: Diagnosis present

## 2022-08-04 LAB — CBG MONITORING, ED: Glucose-Capillary: 201 mg/dL — ABNORMAL HIGH (ref 70–99)

## 2022-08-04 NOTE — ED Triage Notes (Signed)
Patient present C/O CP, SOB with symptoms starting today after patient missed dialysis. Patient states that he did not go to dialysis because of the CP. Pt has hx of chronic hiccups x5years.

## 2022-08-05 ENCOUNTER — Emergency Department (HOSPITAL_COMMUNITY): Payer: Medicare Other

## 2022-08-05 DIAGNOSIS — I10 Essential (primary) hypertension: Secondary | ICD-10-CM | POA: Diagnosis not present

## 2022-08-05 DIAGNOSIS — Z992 Dependence on renal dialysis: Secondary | ICD-10-CM | POA: Diagnosis not present

## 2022-08-05 DIAGNOSIS — I132 Hypertensive heart and chronic kidney disease with heart failure and with stage 5 chronic kidney disease, or end stage renal disease: Secondary | ICD-10-CM | POA: Diagnosis present

## 2022-08-05 DIAGNOSIS — E1151 Type 2 diabetes mellitus with diabetic peripheral angiopathy without gangrene: Secondary | ICD-10-CM | POA: Diagnosis present

## 2022-08-05 DIAGNOSIS — Z8616 Personal history of COVID-19: Secondary | ICD-10-CM | POA: Diagnosis not present

## 2022-08-05 DIAGNOSIS — K920 Hematemesis: Secondary | ICD-10-CM

## 2022-08-05 DIAGNOSIS — Z8249 Family history of ischemic heart disease and other diseases of the circulatory system: Secondary | ICD-10-CM | POA: Diagnosis not present

## 2022-08-05 DIAGNOSIS — R0789 Other chest pain: Secondary | ICD-10-CM | POA: Diagnosis present

## 2022-08-05 DIAGNOSIS — I5A Non-ischemic myocardial injury (non-traumatic): Secondary | ICD-10-CM | POA: Diagnosis not present

## 2022-08-05 DIAGNOSIS — I5030 Unspecified diastolic (congestive) heart failure: Secondary | ICD-10-CM

## 2022-08-05 DIAGNOSIS — H548 Legal blindness, as defined in USA: Secondary | ICD-10-CM | POA: Diagnosis present

## 2022-08-05 DIAGNOSIS — Z91158 Patient's noncompliance with renal dialysis for other reason: Secondary | ICD-10-CM | POA: Diagnosis not present

## 2022-08-05 DIAGNOSIS — J45909 Unspecified asthma, uncomplicated: Secondary | ICD-10-CM | POA: Diagnosis present

## 2022-08-05 DIAGNOSIS — E785 Hyperlipidemia, unspecified: Secondary | ICD-10-CM | POA: Diagnosis present

## 2022-08-05 DIAGNOSIS — K5900 Constipation, unspecified: Secondary | ICD-10-CM | POA: Diagnosis present

## 2022-08-05 DIAGNOSIS — N2581 Secondary hyperparathyroidism of renal origin: Secondary | ICD-10-CM | POA: Diagnosis present

## 2022-08-05 DIAGNOSIS — R079 Chest pain, unspecified: Secondary | ICD-10-CM

## 2022-08-05 DIAGNOSIS — I2510 Atherosclerotic heart disease of native coronary artery without angina pectoris: Secondary | ICD-10-CM | POA: Diagnosis present

## 2022-08-05 DIAGNOSIS — D649 Anemia, unspecified: Secondary | ICD-10-CM | POA: Diagnosis present

## 2022-08-05 DIAGNOSIS — I5032 Chronic diastolic (congestive) heart failure: Secondary | ICD-10-CM | POA: Diagnosis present

## 2022-08-05 DIAGNOSIS — R066 Hiccough: Secondary | ICD-10-CM | POA: Diagnosis present

## 2022-08-05 DIAGNOSIS — I420 Dilated cardiomyopathy: Secondary | ICD-10-CM | POA: Diagnosis present

## 2022-08-05 DIAGNOSIS — Z79899 Other long term (current) drug therapy: Secondary | ICD-10-CM | POA: Diagnosis not present

## 2022-08-05 DIAGNOSIS — R7989 Other specified abnormal findings of blood chemistry: Secondary | ICD-10-CM | POA: Diagnosis present

## 2022-08-05 DIAGNOSIS — R042 Hemoptysis: Secondary | ICD-10-CM | POA: Diagnosis present

## 2022-08-05 DIAGNOSIS — Z742 Need for assistance at home and no other household member able to render care: Secondary | ICD-10-CM

## 2022-08-05 DIAGNOSIS — E1122 Type 2 diabetes mellitus with diabetic chronic kidney disease: Secondary | ICD-10-CM | POA: Diagnosis present

## 2022-08-05 DIAGNOSIS — N186 End stage renal disease: Secondary | ICD-10-CM | POA: Diagnosis present

## 2022-08-05 LAB — BASIC METABOLIC PANEL
Anion gap: 17 — ABNORMAL HIGH (ref 5–15)
BUN: 41 mg/dL — ABNORMAL HIGH (ref 6–20)
CO2: 27 mmol/L (ref 22–32)
Calcium: 8.2 mg/dL — ABNORMAL LOW (ref 8.9–10.3)
Chloride: 89 mmol/L — ABNORMAL LOW (ref 98–111)
Creatinine, Ser: 12.42 mg/dL — ABNORMAL HIGH (ref 0.61–1.24)
GFR, Estimated: 4 mL/min — ABNORMAL LOW (ref >60)
Glucose, Bld: 198 mg/dL — ABNORMAL HIGH (ref 70–99)
Potassium: 3.9 mmol/L (ref 3.5–5.1)
Sodium: 133 mmol/L — ABNORMAL LOW (ref 135–145)

## 2022-08-05 LAB — HEMOGLOBIN AND HEMATOCRIT, BLOOD
HCT: 35.4 % — ABNORMAL LOW (ref 39.0–52.0)
Hemoglobin: 11.5 g/dL — ABNORMAL LOW (ref 13.0–17.0)

## 2022-08-05 LAB — CBC
HCT: 35.4 % — ABNORMAL LOW (ref 39.0–52.0)
Hemoglobin: 11.7 g/dL — ABNORMAL LOW (ref 13.0–17.0)
MCH: 31 pg (ref 26.0–34.0)
MCHC: 33.1 g/dL (ref 30.0–36.0)
MCV: 93.7 fL (ref 80.0–100.0)
Platelets: 179 10*3/uL (ref 150–400)
RBC: 3.78 MIL/uL — ABNORMAL LOW (ref 4.22–5.81)
RDW: 15.9 % — ABNORMAL HIGH (ref 11.5–15.5)
WBC: 7.6 10*3/uL (ref 4.0–10.5)
nRBC: 0 % (ref 0.0–0.2)

## 2022-08-05 LAB — HEPATITIS B SURFACE ANTIGEN: Hepatitis B Surface Ag: NONREACTIVE

## 2022-08-05 LAB — MAGNESIUM: Magnesium: 1.9 mg/dL (ref 1.7–2.4)

## 2022-08-05 LAB — BRAIN NATRIURETIC PEPTIDE: B Natriuretic Peptide: 579.2 pg/mL — ABNORMAL HIGH (ref 0.0–100.0)

## 2022-08-05 LAB — GLUCOSE, CAPILLARY: Glucose-Capillary: 141 mg/dL — ABNORMAL HIGH (ref 70–99)

## 2022-08-05 LAB — TROPONIN I (HIGH SENSITIVITY)
Troponin I (High Sensitivity): 119 ng/L (ref <18)
Troponin I (High Sensitivity): 122 ng/L (ref <18)

## 2022-08-05 MED ORDER — HYDROMORPHONE HCL 1 MG/ML IJ SOLN
0.5000 mg | Freq: Once | INTRAMUSCULAR | Status: AC
  Administered 2022-08-05: 0.5 mg via INTRAVENOUS
  Filled 2022-08-05: qty 1

## 2022-08-05 MED ORDER — ASPIRIN 81 MG PO TBEC: 81.0000 mg | DELAYED_RELEASE_TABLET | Freq: Every day | ORAL | Status: DC

## 2022-08-05 MED ORDER — CARVEDILOL 25 MG PO TABS
25.0000 mg | ORAL_TABLET | Freq: Two times a day (BID) | ORAL | Status: DC
  Administered 2022-08-05 – 2022-08-08 (×6): 25 mg via ORAL
  Filled 2022-08-05 (×6): qty 1

## 2022-08-05 MED ORDER — PANTOPRAZOLE SODIUM 40 MG IV SOLR
40.0000 mg | Freq: Every day | INTRAVENOUS | Status: DC
  Administered 2022-08-05: 40 mg via INTRAVENOUS
  Filled 2022-08-05: qty 10

## 2022-08-05 MED ORDER — MAGNESIUM SULFATE IN D5W 1-5 GM/100ML-% IV SOLN
1.0000 g | Freq: Once | INTRAVENOUS | Status: AC
  Administered 2022-08-05: 1 g via INTRAVENOUS
  Filled 2022-08-05: qty 100

## 2022-08-05 MED ORDER — CALCIUM ACETATE (PHOS BINDER) 667 MG PO CAPS
2001.0000 mg | ORAL_CAPSULE | Freq: Three times a day (TID) | ORAL | Status: DC
  Administered 2022-08-06 – 2022-08-08 (×6): 2001 mg via ORAL
  Filled 2022-08-05 (×7): qty 3

## 2022-08-05 MED ORDER — CHLORHEXIDINE GLUCONATE CLOTH 2 % EX PADS
6.0000 | MEDICATED_PAD | Freq: Every day | CUTANEOUS | Status: DC
  Administered 2022-08-06 – 2022-08-07 (×2): 6 via TOPICAL

## 2022-08-05 MED ORDER — CALCITRIOL 0.25 MCG PO CAPS
1.2500 ug | ORAL_CAPSULE | ORAL | Status: DC
  Administered 2022-08-07: 1.25 ug via ORAL
  Filled 2022-08-05: qty 5

## 2022-08-05 MED ORDER — MORPHINE SULFATE (PF) 2 MG/ML IV SOLN
2.0000 mg | Freq: Once | INTRAVENOUS | Status: AC
  Administered 2022-08-05: 2 mg via INTRAVENOUS
  Filled 2022-08-05: qty 1

## 2022-08-05 MED ORDER — ASPIRIN 81 MG PO TBEC
81.0000 mg | DELAYED_RELEASE_TABLET | Freq: Every day | ORAL | Status: DC
  Administered 2022-08-06 – 2022-08-08 (×3): 81 mg via ORAL
  Filled 2022-08-05 (×3): qty 1

## 2022-08-05 MED ORDER — GABAPENTIN 100 MG PO CAPS
100.0000 mg | ORAL_CAPSULE | Freq: Three times a day (TID) | ORAL | Status: DC
  Administered 2022-08-05 – 2022-08-08 (×8): 100 mg via ORAL
  Filled 2022-08-05 (×8): qty 1

## 2022-08-05 MED ORDER — ASPIRIN 325 MG PO TBEC
325.0000 mg | DELAYED_RELEASE_TABLET | Freq: Every day | ORAL | Status: DC
  Administered 2022-08-05: 325 mg via ORAL
  Filled 2022-08-05: qty 1

## 2022-08-05 MED ORDER — RENA-VITE PO TABS
1.0000 | ORAL_TABLET | Freq: Every day | ORAL | Status: DC
  Administered 2022-08-05 – 2022-08-08 (×4): 1 via ORAL
  Filled 2022-08-05 (×4): qty 1

## 2022-08-05 MED ORDER — HEPARIN SODIUM (PORCINE) 5000 UNIT/ML IJ SOLN
5000.0000 [IU] | Freq: Three times a day (TID) | INTRAMUSCULAR | Status: DC
  Administered 2022-08-05 – 2022-08-08 (×8): 5000 [IU] via SUBCUTANEOUS
  Filled 2022-08-05 (×8): qty 1

## 2022-08-05 MED ORDER — IOHEXOL 350 MG/ML SOLN
75.0000 mL | Freq: Once | INTRAVENOUS | Status: AC | PRN
  Administered 2022-08-05: 75 mL via INTRAVENOUS

## 2022-08-05 NOTE — ED Provider Notes (Signed)
[facility] Emergency Department Provider Note MRN:  [id number]  Arrival date & time: 08/05/22     Chief Complaint   Chest Pain and Shortness of Breath   History of Present Illness   Corey Park is a 52 y.o. year-old male with a history of ESRD, CHF presenting to the ED with chief complaint of chest pain.  Recent cough, occasional hemoptysis, central chest pain described as a pressure.  Missed dialysis today.  Review of Systems  A thorough review of systems was obtained and all systems are negative except as noted in the HPI and PMH.   Patient's Health History    Past Medical History:  Diagnosis Date   Acute CHF (congestive heart failure) ([place]) 07/27/2019   AKI (acute kidney injury) ([place]) 12/11/2016   Allergy, unspecified, initial encounter 08/25/2019   Anemia 2021   Anesthesia of skin 03/31/2020   Asthma    as a child   Blind    Cellulitis, perineum 11/26/2019   CHF (congestive heart failure) ([place]) 2021   Chronic kidney disease, stage V ([place]) 06/17/2018   Chronic kidney disease, stage V ([place]) 123456   Complication of vascular dialysis catheter 08/25/2019   COVID-19 virus infection 07/27/2019   Diabetes mellitus without complication ([place])    type 2   Dilated cardiomyopathy ([place]) 07/03/2018   Elevated troponin    Encounter for removal of sutures 08/04/2020   Fe deficiency anemia 12/23/2018   Fluid overload 12/11/2016   Hypertension    Hypertensive urgency 12/11/2016   Hypomagnesemia 12/13/2016   Legally blind    B/L   Pain, unspecified 10/17/2019   Pneumonia 2021   Renal disorder    dialysis T-TH-Sat   Shortness of breath 11/10/2019   Symptomatic anemia 12/21/2018   Type 2 diabetes mellitus with diabetic peripheral angiopathy without gangrene ([place]) 08/25/2019   Unspecified protein-calorie malnutrition ([place]) 08/25/2019    Past Surgical History:  Procedure Laterality Date   BASCILIC VEIN TRANSPOSITION Right 10/16/2019   Procedure: Basilic Vein  Transposition Right Arm;  Surgeon: Serafina Mitchell, MD;  Location: [facility];  Service: Vascular;  Laterality: Right;   [place] Right 01/29/2020   Procedure: RIGHT ARM SECOND STAGE [place];  Surgeon: Serafina Mitchell, MD;  Location: [facility];  Service: Vascular;  Laterality: Right;   BIOPSY  12/22/2018   Procedure: BIOPSY;  Surgeon: Laurence Spates, MD;  Location: [facility];  Service: Endoscopy;;   ESOPHAGOGASTRODUODENOSCOPY N/A 12/22/2018   Procedure: ESOPHAGOGASTRODUODENOSCOPY (EGD);  Surgeon: Laurence Spates, MD;  Location[facility] ENDOSCOPY;  Service: Endoscopy;  Laterality: N/A;   IR FLUORO GUIDE CV LINE RIGHT  07/29/2019   IR US GUIDE VASC ACCESS RIGHT  07/29/2019    Family History  Problem Relation Age of Onset   CAD Mother    Hypertension Mother    Diabetes Neg Hx    Stroke Neg Hx    Cancer Neg Hx    Kidney failure Neg Hx    Stomach cancer Neg Hx    Colon cancer Neg Hx    Rectal cancer Neg Hx     Social History   Socioeconomic History   Marital status: Single    Spouse name: Not on file   Number of children: Not on file   Years of education: Not on file   Highest education level: Not on file  Occupational History   Not on file  Tobacco Use   Smoking status: Never   Smokeless tobacco: Never  Vaping  Use   Vaping Use: Never used  Substance and Sexual Activity   Alcohol use: Not Currently   Drug use: Not Currently    Types: Marijuana   Sexual activity: Not on file  Other Topics Concern   Not on file  Social History Narrative   Not on file   Social Determinants of Health   Financial Resource Strain: Not on file  Food Insecurity: Not on file  Transportation Needs: Not on file  Physical Activity: Not on file  Stress: Not on file  Social Connections: Not on file  Intimate Partner Violence: Not on file     Physical Exam   Vitals:   08/05/22 0507 08/05/22 0607  BP:  (!) 176/95  Pulse:  85  Resp:  (!) 23  Temp: 97.7 F (36.5 C) 98.3  F (36.8 C)  SpO2:  99%    CONSTITUTIONAL: Well-appearing, NAD NEURO/PSYCH:  Alert and oriented x 3, no focal deficits EYES:  eyes equal and reactive ENT/NECK:  no LAD, no JVD CARDIO: Regular rate, well-perfused, normal S1 and S2 PULM:  CTAB no wheezing or rhonchi GI/GU:  non-distended, non-tender MSK/SPINE:  No gross deformities, no edema SKIN:  no rash, atraumatic   *Additional and/or pertinent findings included in MDM below  Diagnostic and Interventional Summary    EKG Interpretation  Date/Time:  Friday August 04 2022 23:55:47 EST Ventricular Rate:  101 PR Interval:  175 QRS Duration: 111 QT Interval:  451 QTC Calculation: 537 R Axis:   -68 Text Interpretation: Sinus tachycardia Paired ventricular premature complexes LAD, consider left anterior fascicular block Left ventricular hypertrophy Lateral infarct, old Anterior ST elevation, probably due to LVH Prolonged QT interval Confirmed by Gerlene Fee [phone]) on 08/05/2022 12:29:45 AM       Labs Reviewed  BASIC METABOLIC PANEL - Abnormal; Notable for the following components:      Result Value   Sodium 133 (*)    Chloride 89 (*)    Glucose, Bld 198 (*)    BUN 41 (*)    Creatinine, Ser 12.42 (*)    Calcium 8.2 (*)    GFR, Estimated 4 (*)    Anion gap 17 (*)    All other components within normal limits  CBC - Abnormal; Notable for the following components:   RBC 3.78 (*)    Hemoglobin 11.7 (*)    HCT 35.4 (*)    RDW 15.9 (*)    All other components within normal limits  CBG MONITORING, ED - Abnormal; Notable for the following components:   Glucose-Capillary 201 (*)    All other components within normal limits  TROPONIN I (HIGH SENSITIVITY) - Abnormal; Notable for the following components:   Troponin I (High Sensitivity) 119 (*)    All other components within normal limits  TROPONIN I (HIGH SENSITIVITY) - Abnormal; Notable for the following components:   Troponin I (High Sensitivity) 122 (*)    All other  components within normal limits  BRAIN NATRIURETIC PEPTIDE  MAGNESIUM    CT Angio Chest Pulmonary Embolism (PE) W or WO Contrast  Final Result    DG Chest 2 View  Final Result      Medications  heparin injection 5,000 Units (has no administration in time range)  carvedilol (COREG) tablet 25 mg (has no administration in time range)  gabapentin (NEURONTIN) capsule 100 mg (has no administration in time range)  multivitamin (RENA-VIT) tablet 1 tablet (has no administration in time range)  aspirin EC tablet 325 mg (  has no administration in time range)  aspirin EC tablet 81 mg (has no administration in time range)  HYDROmorphone (DILAUDID) injection 0.5 mg (0.5 mg Intravenous Given 08/05/22 0029)  iohexol (OMNIPAQUE) 350 MG/ML injection 75 mL (75 mLs Intravenous Contrast Given 08/05/22 0209)  morphine (PF) 2 MG/ML injection 2 mg (2 mg Intravenous Given 08/05/22 0436)     Procedures  /  Critical Care Procedures  ED Course and Medical Decision Making  Initial Impression and Ddx Differential diagnosis includes pneumonia, ACS, PE, doubt dissection, hyperkalemia is considered given missed dialysis session, EKG is reassuring, awaiting labs.  Past medical/surgical history that increases complexity of ED encounter: ESRD, hypertension, diabetes  Interpretation of Diagnostics I personally reviewed the EKG and my interpretation is as follows: Sinus rhythm  Labs reveal elevated BUN/creatinine at or near recent values, no hyperkalemia, troponin elevated and slightly rising upon repeat.  Patient Reassessment and Ultimate Disposition/Management     Admitted to medicine.  Nephrology made aware.  Patient management required discussion with the following services or consulting groups:  Hospitalist Service  Complexity of Problems Addressed Acute illness or injury that poses threat of life of bodily function  Additional Data Reviewed and Analyzed Further history obtained from: Prior labs/imaging  results  Additional Factors Impacting ED Encounter Risk Consideration of hospitalization  Barth Kirks. Sedonia Small, [place] [email or web address]  Final Clinical Impressions(s) / ED Diagnoses     ICD-10-CM   1. Chest pain, unspecified type  R07.9 heparin injection 5,000 Units    aspirin EC tablet 81 mg    DISCONTINUED: aspirin EC tablet 81 mg      ED Discharge Orders     None        Discharge Instructions Discussed with and Provided to Patient:   Discharge Instructions   None      Maudie Flakes, MD 08/05/22 [phone]

## 2022-08-05 NOTE — ED Notes (Signed)
Patient returned from xray.

## 2022-08-05 NOTE — Assessment & Plan Note (Signed)
S[employer]olics [id number] this morning, likely to come down with HD today. - continue home carvedilol 25 mg BID

## 2022-08-05 NOTE — Assessment & Plan Note (Addendum)
Needs to receive dialysis today before discharge. HD MWF - nephrology consulted, appreciate recs - continue rena-vit

## 2022-08-05 NOTE — Consult Note (Signed)
CARDIOLOGY CONSULT NOTE    Patient ID: Corey Park; [id number]; September 28, 1970   Admit date: 08/04/2022 Date of Consult: 08/05/2022  Primary Care Provider: Leslie Dales, DO Primary Cardiologist: Dr Marlou Porch Primary Electrophysiologist:     Patient Profile:   Corey Park is a 52 y.o. male who is being seen today for the evaluation of chest pain at the request of Dr Corey Park.  History of Present Illness:   [name] is a 52 y/o M known to have hypertensive heart disease with HF improved LVEF (from 30% in 2019 to 55% in 2023), ESRD DD, HTN, intractable hiccups, DM2 presented to the ER with chest pain.  Patient felt nauseous, dizzy yesterday afternoon followed by throwing up blood. He said he threw up a lot of frank blood compared to last time he had pneumonia. This was followed by chest pain in the center of his chest, constant and never resolved. It gets better with positional changes and worsens with each hiccups. Denied SOB, syncope and palpitations. EKG showed sinus tachycardia and no ST-T changes. Hs troponins 119--122.  Past Medical History:  Diagnosis Date   Acute CHF (congestive heart failure) ([place]) 07/27/2019   AKI (acute kidney injury) ([place]) 12/11/2016   Allergy, unspecified, initial encounter 08/25/2019   Anemia 2021   Anesthesia of skin 03/31/2020   Asthma    as a child   Blind    Cellulitis, perineum 11/26/2019   CHF (congestive heart failure) ([place]) 2021   Chronic kidney disease, stage V ([place]) 06/17/2018   Chronic kidney disease, stage V ([place]) 123456   Complication of vascular dialysis catheter 08/25/2019   COVID-19 virus infection 07/27/2019   Diabetes mellitus without complication ([place])    type 2   Dilated cardiomyopathy ([place]) 07/03/2018   Elevated troponin    Encounter for removal of sutures 08/04/2020   Fe deficiency anemia 12/23/2018   Fluid overload 12/11/2016   Hypertension    Hypertensive urgency 12/11/2016   Hypomagnesemia 12/13/2016   Legally blind    B/L    Pain, unspecified 10/17/2019   Pneumonia 2021   Renal disorder    dialysis T-TH-Sat   Shortness of breath 11/10/2019   Symptomatic anemia 12/21/2018   Type 2 diabetes mellitus with diabetic peripheral angiopathy without gangrene ([place]) 08/25/2019   Unspecified protein-calorie malnutrition ([place]) 08/25/2019    Past Surgical History:  Procedure Laterality Date   BASCILIC VEIN TRANSPOSITION Right 10/16/2019   Procedure: Basilic Vein Transposition Right Arm;  Surgeon: Corey Mitchell, MD;  Location: [place];  Service: Vascular;  Laterality: Right;   [place] Right 01/29/2020   Procedure: RIGHT ARM SECOND STAGE [place];  Surgeon: Corey Mitchell, MD;  Location: [place];  Service: Vascular;  Laterality: Right;   BIOPSY  12/22/2018   Procedure: BIOPSY;  Surgeon: Corey Spates, MD;  Location: [place];  Service: Endoscopy;;   ESOPHAGOGASTRODUODENOSCOPY N/A 12/22/2018   Procedure: ESOPHAGOGASTRODUODENOSCOPY (EGD);  Surgeon: Corey Spates, MD;  Location: [facility] ENDOSCOPY;  Service: Endoscopy;  Laterality: N/A;   IR FLUORO GUIDE CV LINE RIGHT  07/29/2019   IR US GUIDE VASC ACCESS RIGHT  07/29/2019    Inpatient Medications: Scheduled Meds:  aspirin EC  325 mg Oral Daily   [START ON 08/06/2022] aspirin EC  81 mg Oral Daily   [START ON 08/07/2022] calcitRIOL  1.25 mcg Oral Q M,W,F-HD   calcium acetate  2,001 mg Oral TID WC   carvedilol  25 mg Oral BID   Chlorhexidine Gluconate Cloth  6 each Topical Q0600   gabapentin  100 mg Oral TID   heparin  5,000 Units Subcutaneous Q8H   multivitamin  1 tablet Oral Daily   Continuous Infusions:  magnesium sulfate bolus IVPB     PRN Meds:   Allergies:   No Known Allergies  Social History:   Social History   Socioeconomic History   Marital status: Single    Spouse name: Not on file   Number of children: Not on file   Years of education: Not on file   Highest education level: Not on file  Occupational History   Not on  file  Tobacco Use   Smoking status: Never   Smokeless tobacco: Never  Vaping Use   Vaping Use: Never used  Substance and Sexual Activity   Alcohol use: Not Currently   Drug use: Not Currently    Types: Marijuana   Sexual activity: Not on file  Other Topics Concern   Not on file  Social History Narrative   Not on file   Social Determinants of Health   Financial Resource Strain: Not on file  Food Insecurity: Not on file  Transportation Needs: Not on file  Physical Activity: Not on file  Stress: Not on file  Social Connections: Not on file  Intimate Partner Violence: Not on file    Family History:    Family History  Problem Relation Age of Onset   CAD Mother    Hypertension Mother    Diabetes Neg Hx    Stroke Neg Hx    Cancer Neg Hx    Kidney failure Neg Hx    Stomach cancer Neg Hx    Colon cancer Neg Hx    Rectal cancer Neg Hx      ROS:  Please see the history of present illness.  ROS  All other ROS reviewed and negative.     Physical Exam/Data:   Vitals:   08/05/22 1415 08/05/22 1434 08/05/22 1500 08/05/22 1530  BP: (!) 153/90 (!) 150/90 136/81 139/81  Pulse: 76 76 72 78  Resp: 14 16 20 $ (!) 22  Temp: 98.3 F (36.8 C) 98.3 F (36.8 C)    TempSrc: Oral     SpO2: 100% 100% 100% 99%  Weight:      Height:       No intake or output data in the 24 hours ending 08/05/22 1556 Filed Weights   08/05/22 0607  Weight: 97 kg   Body mass index is 28.21 kg/m.  General:  Well nourished, well developed, in no acute distress HEENT: normal Lymph: no adenopathy Neck: no JVD Endocrine:  No thryomegaly Vascular: No carotid bruits; FA pulses 2+ bilaterally without bruits  Cardiac:  normal S1, S2; RRR; no murmur  Lungs:  clear to auscultation bilaterally, no wheezing, rhonchi or rales  Abd: soft, nontender, no hepatomegaly  Ext: no edema Musculoskeletal:  No deformities, BUE and BLE strength normal and equal Skin: warm and dry  Neuro:  CNs 2-12 intact, no focal  abnormalities noted Psych:  Normal affect   EKG:  The EKG was personally reviewed and demonstrates:   Telemetry:  Telemetry was personally reviewed and demonstrates:    Relevant CV Studies:   Laboratory Data:  Chemistry Recent Labs  Lab 08/05/22 0004  NA 133*  K 3.9  CL 89*  CO2 27  GLUCOSE 198*  BUN 41*  CREATININE 12.42*  CALCIUM 8.2*  GFRNONAA 4*  ANIONGAP 17*    No results for input(s): "PROT", "  ALBUMIN", "AST", "ALT", "ALKPHOS", "BILITOT" in the last 168 hours. Hematology Recent Labs  Lab 08/05/22 0004  WBC 7.6  RBC 3.78*  HGB 11.7*  HCT 35.4*  MCV 93.7  MCH 31.0  MCHC 33.1  RDW 15.9*  PLT 179   Cardiac EnzymesNo results for input(s): "TROPONINI" in the last 168 hours. No results for input(s): "TROPIPOC" in the last 168 hours.  BNP Recent Labs  Lab 08/05/22 0004  BNP 579.2*    DDimer No results for input(s): "DDIMER" in the last 168 hours.  Radiology/Studies:  CT Angio Chest Pulmonary Embolism (PE) W or WO Contrast  Result Date: 08/05/2022 CLINICAL DATA:  Chest pain, shortness of breath EXAM: CT ANGIOGRAPHY CHEST WITH CONTRAST TECHNIQUE: Multidetector CT imaging of the chest was performed using the standard protocol during bolus administration of intravenous contrast. Multiplanar CT image reconstructions and MIPs were obtained to evaluate the vascular anatomy. RADIATION DOSE REDUCTION: This exam was performed according to the departmental dose-optimization program which includes automated exposure control, adjustment of the mA and/or kV according to patient size and/or use of iterative reconstruction technique. CONTRAST:  84m OMNIPAQUE IOHEXOL 350 MG/ML SOLN COMPARISON:  04/16/2022 FINDINGS: Cardiovascular: No filling defects in the pulmonary arteries to suggest pulmonary emboli. Cardiomegaly. Diffuse coronary artery calcifications. Moderate aortic calcifications. No aneurysm. Mediastinum/Nodes: Mildly enlarged mediastinal lymph nodes. Subcarinal node has a  short axis diameter of 18 mm. Right paratracheal node has a short axis diameter of 10 mm. No hilar or axillary adenopathy. Trachea and esophagus are unremarkable. Thyroid unremarkable. Lungs/Pleura: No confluent opacities or effusions. Upper Abdomen: Imaging into the upper abdomen demonstrates no acute findings. Musculoskeletal: Chest wall soft tissues are unremarkable. No acute bony abnormality. Review of the MIP images confirms the above findings. IMPRESSION: No evidence of pulmonary embolus. Cardiomegaly, coronary artery disease. No acute cardiopulmonary disease. Aortic Atherosclerosis (ICD10-I70.0). Electronically Signed   By: KRolm BaptiseM.D.   On: 08/05/2022 02:13   DG Chest 2 View  Result Date: 08/05/2022 CLINICAL DATA:  Chest pain EXAM: CHEST - 2 VIEW COMPARISON:  04/16/2022 FINDINGS: Cardiomegaly. No confluent airspace opacities, effusions or edema. No acute bony abnormality. IMPRESSION: Cardiomegaly.  No active disease. Electronically Signed   By: KRolm BaptiseM.D.   On: 08/05/2022 01:06    Assessment and Plan:   Patient is a is a 52y/o M known to have hypertensive heart disease with HF improved LVEF (from 30% in 2019 to 55% in 2023), ESRD DD, HTN, intractable hiccups, DM2 presented to the ER with chest pain.  # Non cardiac chest pain #Acute myocardial injury with no evidence of myocardial infarction -Chest pain x 1 day, occurred at rest, worsens with hiccups and better with positional changes. EKG showed sinus tachycardia with no ST-T changes. Hs troponins 100s x 2. -Consider inpatient versus outpatient stress test due to mild troponin leak and risk factors.  # HF improved LVEF # Hypertensive heart disease -Continue Coreg 12.5 mg BID -Echo showed improved LVEF after HTN control  # Hematemesis, self reported -Patient reported hematemesis episode x 1 yesterday. Consider GI evaluation.  I have spent a total of 44 minutes with patient reviewing chart , telemetry, EKGs, labs and  examining patient as well as establishing an assessment and plan that was discussed with the patient.  > 50% of time was spent in direct patient care.      For questions or updates, please contact CSouthportPlease consult [email or web address] for contact info under Cardiology/STEMI.   Signed, Katalyna Socarras Priya  Somtochukwu Woollard, MD 08/05/2022 3:56 PM

## 2022-08-05 NOTE — Consult Note (Addendum)
[facility] Renal Consultation Note    Indication for Consultation:  Management of ESRD/hemodialysis, anemia, hypertension/volume, and secondary hyperparathyroidism.  HPI: Corey Park is a 52 y.o. male with PMH including ESRD on dialysis, CHF, T2DM, and HTN who presented to the ED yesterday with chest pain. He reports he started having constant chest pain yesterday. He has had chronic hiccups for 10 years but they were worse yesterday and he feels this is causing his chest pain. Also SOB when hiccups are exacerbated. Reports he also had one episode of bloody emesis and has not been eating or drinking much for the past 24 hours. He does take gabapentin for the hiccups but still not sufficiently controlled. Reports constipation, last BM 3 days ago, but no melena. He does feel weaker than usual today. He feels he needs more help at home. Missed HD yesterday due to the chest pain. Denies dizziness, headache, SOB, orthopnea, abdominal pain, N/V/D, edema. Labs notable for K+ 3.9, BUN 41, Cr 12.42, Ca 8.2, Hgb 11.7. Troponin [id number]. CTA chest unremarkable, no sign of volume overload. Nephrology has been consulted for management of ESRD.   Past Medical History:  Diagnosis Date   Acute CHF (congestive heart failure) ([place]) 07/27/2019   AKI (acute kidney injury) ([place]) 12/11/2016   Allergy, unspecified, initial encounter 08/25/2019   Anemia 2021   Anesthesia of skin 03/31/2020   Asthma    as a child   Blind    Cellulitis, perineum 11/26/2019   CHF (congestive heart failure) ([place]) 2021   Chronic kidney disease, stage V ([place]) 06/17/2018   Chronic kidney disease, stage V ([place]) 123456   Complication of vascular dialysis catheter 08/25/2019   COVID-19 virus infection 07/27/2019   Diabetes mellitus without complication ([place])    type 2   Dilated cardiomyopathy ([place]) 07/03/2018   Elevated troponin    Encounter for removal of sutures 08/04/2020   Fe deficiency anemia 12/23/2018   Fluid overload  12/11/2016   Hypertension    Hypertensive urgency 12/11/2016   Hypomagnesemia 12/13/2016   Legally blind    B/L   Pain, unspecified 10/17/2019   Pneumonia 2021   Renal disorder    dialysis T-TH-Sat   Shortness of breath 11/10/2019   Symptomatic anemia 12/21/2018   Type 2 diabetes mellitus with diabetic peripheral angiopathy without gangrene ([place]) 08/25/2019   Unspecified protein-calorie malnutrition ([place]) 08/25/2019   Past Surgical History:  Procedure Laterality Date   BASCILIC VEIN TRANSPOSITION Right 10/16/2019   Procedure: Basilic Vein Transposition Right Arm;  Surgeon: Serafina Mitchell, MD;  Location: [place];  Service: Vascular;  Laterality: Right;   [place] Right 01/29/2020   Procedure: RIGHT ARM SECOND STAGE [place];  Surgeon: Serafina Mitchell, MD;  Location: [place];  Service: Vascular;  Laterality: Right;   BIOPSY  12/22/2018   Procedure: BIOPSY;  Surgeon: Laurence Spates, MD;  Location: [place];  Service: Endoscopy;;   ESOPHAGOGASTRODUODENOSCOPY N/A 12/22/2018   Procedure: ESOPHAGOGASTRODUODENOSCOPY (EGD);  Surgeon: Laurence Spates, MD;  Location[facility] ENDOSCOPY;  Service: Endoscopy;  Laterality: N/A;   IR FLUORO GUIDE CV LINE RIGHT  07/29/2019   IR US GUIDE VASC ACCESS RIGHT  07/29/2019   Family History  Problem Relation Age of Onset   CAD Mother    Hypertension Mother    Diabetes Neg Hx    Stroke Neg Hx    Cancer Neg Hx    Kidney failure Neg Hx    Stomach cancer Neg Hx  Colon cancer Neg Hx    Rectal cancer Neg Hx    Social History:  reports that he has never smoked. He has never used smokeless tobacco. He reports that he does not currently use alcohol. He reports that he does not currently use drugs after having used the following drugs: Marijuana.  ROS: As per HPI otherwise negative.   Physical Exam: Vitals:   08/05/22 0500 08/05/22 0507 08/05/22 0607 08/05/22 0807  BP: (!) 160/92  (!) 176/95 (!) 151/84  Pulse: 79  85 78  Resp: (!)  21  (!) 23 (!) 23  Temp:  97.7 F (36.5 C) 98.3 F (36.8 C) 98.4 F (36.9 C)  TempSrc:  Oral Oral Oral  SpO2: 100%  99% 96%  Weight:   97 kg   Height:   6' 1"$  (1.854 m)      General: Well developed, well nourished, in no acute distress. Frequent hiccups Head: Normocephalic, atraumatic, sclera non-icteric, mucus membranes are moist. Lungs: Clear bilaterally to auscultation without wheezes, rales, or rhonchi. Breathing is unlabored. On O2 via [place] Heart: RRR with normal S1, S2. No murmurs, rubs, or gallops appreciated. Abdomen: Soft, non-tender, non-distended with normoactive bowel sounds. No rebound/guarding. No obvious abdominal masses. Musculoskeletal:  Strength and tone appear normal for age. Lower extremities: No edema b/l lower extremities Neuro: Alert and oriented X 3. Moves all extremities spontaneously. Psych:  Responds to questions appropriately with a normal affect. Dialysis Access: RUE AVF + bruit  No Known Allergies Prior to Admission medications   Medication Sig Start Date End Date Taking? Authorizing Provider  calcium acetate (PHOSLO) 667 MG tablet Take 1,334 mg by mouth 3 (three) times daily. 09/12/19  Yes [provider]  COREG 25 MG tablet Take 25 mg by mouth 2 (two) times daily. 04/12/22  Yes [provider]  gabapentin (NEURONTIN) 100 MG capsule Take 100 mg by mouth 3 (three) times daily.   Yes [provider]  multivitamin (RENA-VIT) TABS tablet Take 1 tablet by mouth daily. 08/29/19  Yes [provider]   Current Facility-Administered Medications  Medication Dose Route Frequency Provider Last Rate Last Admin   aspirin EC tablet 325 mg  325 mg Oral Daily Camelia Phenes, MD   325 mg at 08/05/22 0808   [START ON 08/06/2022] aspirin EC tablet 81 mg  81 mg Oral Daily Camelia Phenes, MD       carvedilol (COREG) tablet 25 mg  25 mg Oral BID Camelia Phenes, MD   25 mg at 08/05/22 [id number]   gabapentin (NEURONTIN) capsule 100 mg  100 mg Oral TID Camelia Phenes, MD   100 mg at 08/05/22 [id number]   heparin injection 5,000 Units  5,000 Units Subcutaneous [name], MD       multivitamin (RENA-VIT) tablet 1 tablet  1 tablet Oral Daily Camelia Phenes, MD   1 tablet at 08/05/22 0808   Labs: Basic Metabolic Panel: Recent Labs  Lab 08/05/22 0004  NA 133*  K 3.9  CL 89*  CO2 27  GLUCOSE 198*  BUN 41*  CREATININE 12.42*  CALCIUM 8.2*    CBC: Recent Labs  Lab 08/05/22 0004  WBC 7.6  HGB 11.7*  HCT 35.4*  MCV 93.7  PLT 179   CBG: Recent Labs  Lab 08/04/22 2354 08/05/22 0840  GLUCAP 201* 141*    Studies/Results: CT Angio Chest Pulmonary Embolism (PE) W or WO Contrast  Result Date: 08/05/2022 CLINICAL DATA:  Chest pain, shortness of breath EXAM: CT  ANGIOGRAPHY CHEST WITH CONTRAST TECHNIQUE: Multidetector CT imaging of the chest was performed using the standard protocol during bolus administration of intravenous contrast. Multiplanar CT image reconstructions and MIPs were obtained to evaluate the vascular anatomy. RADIATION DOSE REDUCTION: This exam was performed according to the departmental dose-optimization program which includes automated exposure control, adjustment of the mA and/or kV according to patient size and/or use of iterative reconstruction technique. CONTRAST:  60m OMNIPAQUE IOHEXOL 350 MG/ML SOLN COMPARISON:  04/16/2022 FINDINGS: Cardiovascular: No filling defects in the pulmonary arteries to suggest pulmonary emboli. Cardiomegaly. Diffuse coronary artery calcifications. Moderate aortic calcifications. No aneurysm. Mediastinum/Nodes: Mildly enlarged mediastinal lymph nodes. Subcarinal node has a short axis diameter of 18 mm. Right paratracheal node has a short axis diameter of 10 mm. No hilar or axillary adenopathy. Trachea and esophagus are unremarkable. Thyroid unremarkable. Lungs/Pleura: No confluent opacities or effusions. Upper Abdomen: Imaging into the upper abdomen demonstrates no acute findings. Musculoskeletal: Chest  wall soft tissues are unremarkable. No acute bony abnormality. Review of the MIP images confirms the above findings. IMPRESSION: No evidence of pulmonary embolus. Cardiomegaly, coronary artery disease. No acute cardiopulmonary disease. Aortic Atherosclerosis (ICD10-I70.0). Electronically Signed   By: KRolm BaptiseM.D.   On: 08/05/2022 02:13   DG Chest 2 View  Result Date: 08/05/2022 CLINICAL DATA:  Chest pain EXAM: CHEST - 2 VIEW COMPARISON:  04/16/2022 FINDINGS: Cardiomegaly. No confluent airspace opacities, effusions or edema. No acute bony abnormality. IMPRESSION: Cardiomegaly.  No active disease. Electronically Signed   By: KRolm BaptiseM.D.   On: 08/05/2022 01:06    Dialysis Orders:  Center: [facility] on MWF . 180NRe 4 hours BFR 450 DFR Auto 1.5 EDW 100kg 2K 2CaAVF 15g Heparin 6000 unit bolus Mircera 2054m IV q 2 weeks Venofer 5073meekly Calcitriol 1.11m74mO q HD Sensipar 30mg40mD  BP meds: coreg 35mg 33m olmesartan 20mg d79m Binder: Calcium acetate 3 tabs per meal  Assessment/Plan:  Chest pain: Crushing chest pain which he thinks is related to hiccups. Cardiology consulted by primary team. EKG unchanged.   ESRD:  [facility] on MWF schedule, missed yesterday due to chest pain. Will plan for HD today.   Hypertension/volume: Typically has very large UF goals outpatient (6-8L). BP elevated but no evidence of volume overload on exam or CT chest. Below his outpatient EDW and poor PO intake. UFG 1-2L today.   Anemia: Hgb at goal, no ESA needed at this time   Metabolic bone disease: Calcium controlled. Will continue calcitriol. NPO so holding sensipar for now. Resume calcium acetate once eating.   Nutrition:  Currently NPO Dispo: needs more help at home, [facility]  SamanthAnice Paganini2/03/2023, 9:43 AM  [facility] Pager: [phone]

## 2022-08-05 NOTE — Progress Notes (Addendum)
OT Cancellation Note  Patient Details Name: Corey Park MRN: [id number] DOB: 1970-11-11   Cancelled Treatment:    Reason Eval/Treat Not Completed: Other (comment) (pt with crushing chest pain and increased troponins, awaiting cardiology consult. Will follow up for OT evaluation as appropriate after Cardiology input)  1456: Pt still awaiting cardiology consult, now off unit for HD.  Ulla Gallo 08/05/2022, 10:40 AM

## 2022-08-05 NOTE — Progress Notes (Addendum)
PT Cancellation Note  Patient Details Name: Corey Park MRN: [id number] DOB: 1970/12/06   Cancelled Treatment:    Reason Eval/Treat Not Completed: Medical issues which prohibited therapy 9:25 am :Received imminent d/c orders on this pt ; however, noted pt with c/o crushing chest pain , +troponins that increased , and has cardiology consult.  Will await cardiology input.   14:58: Pt to HD.  Will f/u tomorrow.  Abran Richard, PT Acute Rehab [facility] [phone]   Karlton Lemon 08/05/2022, 9:25 AM

## 2022-08-05 NOTE — ED Notes (Signed)
Patient gone to Xray

## 2022-08-05 NOTE — ED Notes (Signed)
MD Bero informed of patient troponin level.

## 2022-08-05 NOTE — Progress Notes (Signed)
   08/05/22 1830  Vitals  Temp 98.1 F (36.7 C)  Temp Source Oral  BP (!) 147/89  MAP (mmHg) 99  BP Location Left Leg  BP Method Automatic  Patient Position (if appropriate) Lying  Pulse Rate 72  Pulse Rate Source Monitor  ECG Heart Rate 72  Resp (!) 22  Oxygen Therapy  SpO2 100 %  O2 Device Nasal Cannula  O2 Flow Rate (L/min) 2 L/min  During Treatment Monitoring  Intra-Hemodialysis Comments Tx completed (pt s access is with stasis after approx 10 min--no ecchymosis or infiltrations)  Post Treatment  Dialyzer Clearance Lightly streaked  Duration of HD Treatment -hour(s) 3.39 hour(s)  Hemodialysis Intake (mL) 0 mL  Liters Processed 84.7  Fluid Removed (mL) 1800 mL  Tolerated HD Treatment Yes  Post-Hemodialysis Comments no complications or issues  AVG/AVF Arterial Site Held (minutes) 10 minutes  AVG/AVF Venous Site Held (minutes) 10 minutes   Received patient in bed to unit.  Alert and oriented.  Informed consent signed and in chart.   TX duration:4 hours  Patient tolerated well.  Transported back to the room  Alert, without acute distress.  Hand-off given to patient's nurse.   Access used: RUAF Access issues: no complications  Total UF removed: 1800 Medication(s) given: heparin 4000 u iv bolus per tx and heparin 2000 u [facility]

## 2022-08-05 NOTE — Assessment & Plan Note (Addendum)
Stable.  Volume managed with HD. - Follow-up cardiology outpatient

## 2022-08-05 NOTE — Progress Notes (Signed)
FMTS Interim Progress Note  S: Went to evaluate patient in hemodialysis unit with Dr. Marcina Millard.  Patient Dors is still having left-sided chest pain on the breast, worse with hiccups and better with positional changes.  Does not sound cardiac-and patient has had similar pain in the past.   O: BP 133/81 (BP Location: Left Arm)   Pulse 72   Temp 98.3 F (36.8 C)   Resp 18   Ht 6' 1"$  (1.854 m)   Wt 97 kg   SpO2 100%   BMI 28.21 kg/m    General: Resting comfortably in bed, NAD Cardio: RRR, no murmurs Respiratory: CTAB, normal breathing room air GI: Soft, not tender, not distended.  Bowel sounds present  A/P:  Chest pain Does not sound cardiac in nature, patient also endorses some nausea and vomiting when having chest pain.  He reports vomiting blood 1 time, however patient is legally blind and unsure how he knows this-low concern for GI bleed as patient has hemoglobin of 11.7 which is greater than his baseline.  On last visit with PCP (me) symptoms are suspicious of GERD, wonder if he may benefit from PPI.  Cardiology has seen, recommend inpatient versus outpatient stress test-low suspicion of ACS.  Echocardiogram in 03/2022 showed improved function with hypertension control.   Need for assistance at home without other household member able to render care - [facility] consulted, appreciate recs  Leslie Dales, DO 08/05/2022, 5:26 PM PGY-1, [place] [facility] pager [phone]

## 2022-08-05 NOTE — Assessment & Plan Note (Addendum)
Currently resolved.  ACS ruled out.  PPI started for suspected GERD.  Reproducible chest wall pain, started Voltaren gel.  No further episodes of vomiting.  OT recommends home aide. - Outpatient cardiology follow-up - Outpatient stress test

## 2022-08-05 NOTE — H&P (Addendum)
Hospital Admission History and Physical Service [place]: [phone]  Patient name: Corey Park Medical record number: [id number] Date of Birth: 12-Mar-1971 Age: 52 y.o. Gender: male  Primary Care Provider: Leslie Dales, DO Consultants: Nephro, Cardiology Code Status: Full Preferred Emergency Contact: Corey Park (Mother) [phone] or [phone]  Chief Complaint: chest pain  Assessment and Plan: Corey Park is a 52 y.o. male presenting to the ED with chest pain. 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 Differential for presentation of this includes ACS, musculoskeletal pain, anxiety, PE, CHF exacerbation, pneumonia.  * Chest pain Presented with 1 day of crushing, sharp pain located in anterior L chest. Troponin 119 >122. EKG unchanged from prior. Certainly has risk factors for ACS. No suspicion for CHF exacerbation or pulmonary etiology based on exam and workup thus far. Less likely MSK or anxiety. - admit to FMTS, med-tele, attending Dr. Thompson Park - consult cardiology, appreciate recs - loading dose aspirin 325, then resume home aspirin 81 mg daily starting tomorrow - f/u lipid panel - PT/OT eval and treat  ESRD on hemodialysis [facility]) Patient missed dialysis session yesterday due to chest pain. HD MWF - nephrology consulted, appreciate recs - continue rena-vit  (HFpEF) heart failure with preserved ejection fraction ([place]) Does not appear to be in acute exacerbation. Echo 03/2022 shows EF 55% with L basal inferior hypokinesis and mild concentric left ventricular hypertrophy (improved from prior EF of 25%) - cardiology consult as above - f/u BNP - volume management with HD  HTN (hypertension) Sy[employer]olics [id number] - [id number] - continue home carvedilol 25 mg BID  Need for assistance at home without other household member able to render care Son unable to adequately care for him at home. He  is legally blind. Requesting help with cooking, cleaning house, and doing laundry - TOC consulted, appreciate recs   Chronic and stable conditions: Chronic hiccups - takes gabapentin 100 mg TID DM II -not on any home meds. F/u A1c. CBG monitoring BID. Hold off on SSI for now Blindness  FEN/GI: NPO pending cards eval VTE Prophylaxis: Heparin  Disposition: tele  History of Present Illness:  Corey Park is a 52 y.o. male presenting with chest pain, SOB  Chest pain started yesterday afternoon around 2pm while at work. Went home and layed down, took 1 carvedilol to see if that might help. Woke up at 10:30pm and it was worse so he came in. Pain is located in left anterior chest (near nipple area) and has been constant since it's onset. Described as "crushing like somebody sitting on my chest" and "sharp like somebody sticking a pen in my chest".  He also had nausea and episode of vomiting yesterday as well as some shortness of breath which he reports is chronic but somewhat worse than normal. He attributes some of his shortness of breath to his chronic hiccups. Uses oxygen with HD sessions but none at home. Father died of MI in his sleep which made patient nervous about his chest pain last night. Denies abdominal pain. Endorses constipation with last BM being 3 days ago.  Missed dialysis because of the chest pain.  Asking about assistance at home -- son reportedly unreliable in providing him the necessary care. Requesting help with cooking, cleaning, and doing laundry in his house.  Review Of Systems: Per HPI  Pertinent Past Medical History: ESRD on HD MWF HFrEF Remainder reviewed in history tab.  Pertinent Past Surgical History: None pertinent Reviewed in history tab.   Pertinent Social History: Tobacco use: No Alcohol use: Denies Other Substance use: No Lives alone  Pertinent Family History: [place]: died of MI Mom: diabetes, CHF  Remainder reviewed in history tab.    Important Outpatient Medications: Carvedilol 46m BID Gabapentin 1078mTID Renavit daily  Remainder reviewed in medication history.   Objective: BP (!) 176/95 (BP Location: Left Arm)   Pulse 85   Temp 98.3 F (36.8 C) (Oral)   Resp (!) 23   Ht 6' 1"$  (1.854 m)   Wt 97 kg   SpO2 99%   BMI 28.21 kg/m  Exam: General: well-appearing and in no acute distress HEENT: normocephalic and atraumatic Respiratory: CTAB anteriorly, normal respiratory effort, and on 2.5 L nasal cannula Extremities: moving all extremities spontaneously Gastrointestinal: non-tender and non-distended Cardiovascular: regular rate  Labs:  CBC BMET  Recent Labs  Lab 08/05/22 0004  WBC 7.6  HGB 11.7*  HCT 35.4*  PLT 179   Recent Labs  Lab 08/05/22 0004  NA 133*  K 3.9  CL 89*  CO2 27  BUN 41*  CREATININE 12.42*  GLUCOSE 198*  CALCIUM 8.2*    Pertinent additional labs: troponin 119>122.   EKG: Anterior ST elevation. QTc 537  Imaging Studies Performed:  CXR 2/10 IMPRESSION: Cardiomegaly.  No active disease.  CTPA 2/10 IMPRESSION: No evidence of pulmonary embolus.   Cardiomegaly, coronary artery disease.   No acute cardiopulmonary disease.  Corey PhenesMD 08/05/2022, 6:44 AM PGY-1, [facility] [place]: [phone] pages welcome Secure chat group [facility] Resident Addendum   I have independently interviewed and examined the patient. I have discussed the above with Dr. TaEdd Arbournd agree with the documented plan. My edits for correction/addition/clarification are included above. Please see any attending notes.   Corey DadMD PGY-3, [facility] 08/05/2022 7:51 AM  FPTS Service [place]: [phone] pages welcome through [facility]

## 2022-08-05 NOTE — Assessment & Plan Note (Signed)
Son unable to adequately care for him at home. He is legally blind. Requesting help with cooking, cleaning house, and doing laundry.  - TOC consulted, appreciate recs

## 2022-08-05 NOTE — ED Notes (Signed)
ED TO INPATIENT HANDOFF REPORT  ED Nurse Name and Phone #: Carron Curie. RN [phone]  S Name/Age/Gender Corey Park 52 y.o. male Room/Bed: 022C/022C  Code Status   Code Status: Prior  Home/SNF/Other Home Patient oriented to: self, place, time, and situation Is this baseline? Yes   Triage Complete: Triage complete  Chief Complaint Chest pain [R07.9]  Triage Note Patient present C/O CP, SOB with symptoms starting today after patient missed dialysis. Patient states that he did not go to dialysis because of the CP. Pt has hx of chronic hiccups x5years.   Allergies No Known Allergies  Level of Care/Admitting Diagnosis ED Disposition     ED Disposition  Admit   Condition  --   Comment  Hospital Area: [place] [[id number]]  Level of Care: Telemetry Medical [104]  May place patient in observation at [facility] or [place] if equivalent level of care is available:: No  Covid Evaluation: Asymptomatic - no recent exposure (last 10 days) testing not required  Diagnosis: Chest pain [id number]  Admitting Physician: Lenoria Chime [id number]  Attending Physician: Lenoria Chime [id number]          B Medical/Surgery History Past Medical History:  Diagnosis Date   Acute CHF (congestive heart failure) ([place]) 07/27/2019   AKI (acute kidney injury) ([place]) 12/11/2016   Allergy, unspecified, initial encounter 08/25/2019   Anemia 2021   Anesthesia of skin 03/31/2020   Asthma    as a child   Blind    Cellulitis, perineum 11/26/2019   CHF (congestive heart failure) ([place]) 2021   Chronic kidney disease, stage V ([place]) 06/17/2018   Chronic kidney disease, stage V ([place]) 123456   Complication of vascular dialysis catheter 08/25/2019   COVID-19 virus infection 07/27/2019   Diabetes mellitus without complication ([place])    type 2   Dilated cardiomyopathy ([place]) 07/03/2018   Elevated troponin    Encounter for removal of sutures 08/04/2020   Fe deficiency anemia 12/23/2018    Fluid overload 12/11/2016   Hypertension    Hypertensive urgency 12/11/2016   Hypomagnesemia 12/13/2016   Legally blind    B/L   Pain, unspecified 10/17/2019   Pneumonia 2021   Renal disorder    dialysis T-TH-Sat   Shortness of breath 11/10/2019   Symptomatic anemia 12/21/2018   Type 2 diabetes mellitus with diabetic peripheral angiopathy without gangrene ([place]) 08/25/2019   Unspecified protein-calorie malnutrition ([place]) 08/25/2019   Past Surgical History:  Procedure Laterality Date   BASCILIC VEIN TRANSPOSITION Right 10/16/2019   Procedure: Basilic Vein Transposition Right Arm;  Surgeon: Serafina Mitchell, MD;  Location: [facility] OR;  Service: Vascular;  Laterality: Right;   [place] Right 01/29/2020   Procedure: RIGHT ARM SECOND STAGE [place];  Surgeon: Serafina Mitchell, MD;  Location: [place];  Service: Vascular;  Laterality: Right;   BIOPSY  12/22/2018   Procedure: BIOPSY;  Surgeon: Laurence Spates, MD;  Location: [facility] ENDOSCOPY;  Service: Endoscopy;;   ESOPHAGOGASTRODUODENOSCOPY N/A 12/22/2018   Procedure: ESOPHAGOGASTRODUODENOSCOPY (EGD);  Surgeon: Laurence Spates, MD;  Location[facility] ENDOSCOPY;  Service: Endoscopy;  Laterality: N/A;   IR FLUORO GUIDE CV LINE RIGHT  07/29/2019   IR US GUIDE VASC ACCESS RIGHT  07/29/2019     A IV Location/Drains/Wounds Patient Lines/Drains/Airways Status     Active Line/Drains/Airways     Name Placement date Placement time Site Days   Peripheral IV 08/05/22 18 G Anterior;Distal;Left;Upper Arm 08/05/22  0024  Arm  less  than 1   Fistula / Graft Right Upper arm Arteriovenous fistula 10/16/19  0833  Upper arm  1024            Intake/Output Last 24 hours No intake or output data in the 24 hours ending 08/05/22 0458  Labs/Imaging Results for orders placed or performed during the hospital encounter of 08/04/22 (from the past 48 hour(s))  CBG monitoring, ED     Status: Abnormal   Collection Time: 08/04/22 11:54 PM  Result Value  Ref Range   Glucose-Capillary 201 (H) 70 - 99 mg/dL    Comment: Glucose reference range applies only to samples taken after fasting for at least 8 hours.  Basic metabolic panel     Status: Abnormal   Collection Time: 08/05/22 12:04 AM  Result Value Ref Range   Sodium 133 (L) 135 - 145 mmol/L   Potassium 3.9 3.5 - 5.1 mmol/L   Chloride 89 (L) 98 - 111 mmol/L   CO2 27 22 - 32 mmol/L   Glucose, Bld 198 (H) 70 - 99 mg/dL    Comment: Glucose reference range applies only to samples taken after fasting for at least 8 hours.   BUN 41 (H) 6 - 20 mg/dL   Creatinine, Ser 12.42 (H) 0.61 - 1.24 mg/dL   Calcium 8.2 (L) 8.9 - 10.3 mg/dL   GFR, Estimated 4 (L) >60 mL/min    Comment: (NOTE) Calculated using the CKD-EPI Creatinine Equation (2021)    Anion gap 17 (H) 5 - 15    Comment: Performed at [facility] [address] [place], [place] [address]  CBC     Status: Abnormal   Collection Time: 08/05/22 12:04 AM  Result Value Ref Range   WBC 7.6 4.0 - 10.5 K/uL   RBC 3.78 (L) 4.22 - 5.81 MIL/uL   Hemoglobin 11.7 (L) 13.0 - 17.0 g/dL   HCT 35.4 (L) 39.0 - 52.0 %   MCV 93.7 80.0 - 100.0 fL   MCH 31.0 26.0 - 34.0 pg   MCHC 33.1 30.0 - 36.0 g/dL   RDW 15.9 (H) 11.5 - 15.5 %   Platelets 179 150 - 400 K/uL   nRBC 0.0 0.0 - 0.2 %    Comment: Performed at [facility], [place] [address], [place], [place] [address]  Troponin I (High Sensitivity)     Status: Abnormal   Collection Time: 08/05/22 12:04 AM  Result Value Ref Range   Troponin I (High Sensitivity) 119 (HH) <18 ng/L    Comment: CRITICAL RESULT CALLED TO, READ BACK BY AND VERIFIED WITH W. FARMER, RN AT [phone] ON 08/05/22 BY H. HOWARD. (NOTE) Elevated high sensitivity troponin I (hsTnI) values and significant  changes across serial measurements may suggest ACS but many other  chronic and acute conditions are known to elevate hsTnI results.  Refer to the "Links" section for chest pain algorithms and additional  guidance. Performed  at [facility], [place] [address] [place], [place] [address]   Troponin I (High Sensitivity)     Status: Abnormal   Collection Time: 08/05/22  2:32 AM  Result Value Ref Range   Troponin I (High Sensitivity) 122 (HH) <18 ng/L    Comment: CRITICAL VALUE NOTED. VALUE IS CONSISTENT WITH PREVIOUSLY REPORTED/CALLED VALUE (NOTE) Elevated high sensitivity troponin I (hsTnI) values and significant  changes across serial measurements may suggest ACS but many other  chronic and acute conditions are known to elevate hsTnI results.  Refer to the "  Links" section for chest pain algorithms and additional  guidance. Performed at [facility], [place] [address] [place], [place] [address]    CT Angio Chest Pulmonary Embolism (PE) W or WO Contrast  Result Date: 08/05/2022 CLINICAL DATA:  Chest pain, shortness of breath EXAM: CT ANGIOGRAPHY CHEST WITH CONTRAST TECHNIQUE: Multidetector CT imaging of the chest was performed using the standard protocol during bolus administration of intravenous contrast. Multiplanar CT image reconstructions and MIPs were obtained to evaluate the vascular anatomy. RADIATION DOSE REDUCTION: This exam was performed according to the departmental dose-optimization program which includes automated exposure control, adjustment of the mA and/or kV according to patient size and/or use of iterative reconstruction technique. CONTRAST:  57m OMNIPAQUE IOHEXOL 350 MG/ML SOLN COMPARISON:  04/16/2022 FINDINGS: Cardiovascular: No filling defects in the pulmonary arteries to suggest pulmonary emboli. Cardiomegaly. Diffuse coronary artery calcifications. Moderate aortic calcifications. No aneurysm. Mediastinum/Nodes: Mildly enlarged mediastinal lymph nodes. Subcarinal node has a short axis diameter of 18 mm. Right paratracheal node has a short axis diameter of 10 mm. No hilar or axillary adenopathy. Trachea and esophagus are unremarkable. Thyroid unremarkable. Lungs/Pleura: No confluent  opacities or effusions. Upper Abdomen: Imaging into the upper abdomen demonstrates no acute findings. Musculoskeletal: Chest wall soft tissues are unremarkable. No acute bony abnormality. Review of the MIP images confirms the above findings. IMPRESSION: No evidence of pulmonary embolus. Cardiomegaly, coronary artery disease. No acute cardiopulmonary disease. Aortic Atherosclerosis (ICD10-I70.0). Electronically Signed   By: KRolm BaptiseM.D.   On: 08/05/2022 02:13   DG Chest 2 View  Result Date: 08/05/2022 CLINICAL DATA:  Chest pain EXAM: CHEST - 2 VIEW COMPARISON:  04/16/2022 FINDINGS: Cardiomegaly. No confluent airspace opacities, effusions or edema. No acute bony abnormality. IMPRESSION: Cardiomegaly.  No active disease. Electronically Signed   By: KRolm BaptiseM.D.   On: 08/05/2022 01:06    Pending Labs Unresulted Labs (From admission, onward)     Start     Ordered   08/05/22 0438  Magnesium  Add-on,   AD        08/05/22 0438   08/05/22 0017  Brain natriuretic peptide  Once,   URGENT        08/05/22 0016            Vitals/Pain Today's Vitals   08/05/22 0315 08/05/22 0345 08/05/22 0345 08/05/22 0438  BP:  138/82    Pulse:  74    Resp:  20    Temp:   97.8 F (36.6 C)   TempSrc:   Oral   SpO2:  97%    PainSc: 10-Worst pain ever   7     Isolation Precautions No active isolations  Medications Medications  HYDROmorphone (DILAUDID) injection 0.5 mg (0.5 mg Intravenous Given 08/05/22 0029)  iohexol (OMNIPAQUE) 350 MG/ML injection 75 mL (75 mLs Intravenous Contrast Given 08/05/22 0209)  morphine (PF) 2 MG/ML injection 2 mg (2 mg Intravenous Given 08/05/22 0436)    Mobility walks     Focused Assessments Cardiac Assessment Handoff:    Lab Results  Component Value Date   [place]OP[place]INI 0.18 ([place] 06/17/2018   Lab Results  Component Value Date   DDIMER 1.42 (H) 08/03/2019   Does the Patient currently have chest pain? Yes    R Recommendations: See Admitting Provider  Note  Report given to:   Additional Notes[facility]  Pt was given 2 mg Morphine approx 30 minutes ago.  He is currently 8/10 CP.  Pt is A & O x4 but is legally blind.  He likes to be told before doing anything to him.

## 2022-08-06 ENCOUNTER — Other Ambulatory Visit: Payer: Self-pay

## 2022-08-06 ENCOUNTER — Encounter (HOSPITAL_COMMUNITY): Payer: Self-pay | Admitting: Family Medicine

## 2022-08-06 DIAGNOSIS — R0789 Other chest pain: Secondary | ICD-10-CM

## 2022-08-06 DIAGNOSIS — I10 Essential (primary) hypertension: Secondary | ICD-10-CM

## 2022-08-06 DIAGNOSIS — I5A Non-ischemic myocardial injury (non-traumatic): Secondary | ICD-10-CM

## 2022-08-06 LAB — BASIC METABOLIC PANEL
Anion gap: 16 — ABNORMAL HIGH (ref 5–15)
BUN: 27 mg/dL — ABNORMAL HIGH (ref 6–20)
CO2: 27 mmol/L (ref 22–32)
Calcium: 8.3 mg/dL — ABNORMAL LOW (ref 8.9–10.3)
Chloride: 91 mmol/L — ABNORMAL LOW (ref 98–111)
Creatinine, Ser: 10.11 mg/dL — ABNORMAL HIGH (ref 0.61–1.24)
GFR, Estimated: 6 mL/min — ABNORMAL LOW (ref >60)
Glucose, Bld: 80 mg/dL (ref 70–99)
Potassium: 4.2 mmol/L (ref 3.5–5.1)
Sodium: 134 mmol/L — ABNORMAL LOW (ref 135–145)

## 2022-08-06 LAB — CBC
HCT: 35.7 % — ABNORMAL LOW (ref 39.0–52.0)
Hemoglobin: 11.4 g/dL — ABNORMAL LOW (ref 13.0–17.0)
MCH: 30.1 pg (ref 26.0–34.0)
MCHC: 31.9 g/dL (ref 30.0–36.0)
MCV: 94.2 fL (ref 80.0–100.0)
Platelets: 180 10*3/uL (ref 150–400)
RBC: 3.79 MIL/uL — ABNORMAL LOW (ref 4.22–5.81)
RDW: 16.2 % — ABNORMAL HIGH (ref 11.5–15.5)
WBC: 6.4 10*3/uL (ref 4.0–10.5)
nRBC: 0 % (ref 0.0–0.2)

## 2022-08-06 LAB — HEMOGLOBIN A1C
Hgb A1c MFr Bld: 4.7 % — ABNORMAL LOW (ref 4.8–5.6)
Mean Plasma Glucose: 88.19 mg/dL

## 2022-08-06 LAB — MRSA NEXT GEN BY PCR, NASAL: MRSA by PCR Next Gen: NOT DETECTED

## 2022-08-06 LAB — LIPID PANEL
Cholesterol: 147 mg/dL (ref 0–200)
HDL: 38 mg/dL — ABNORMAL LOW (ref >40)
LDL Cholesterol: 94 mg/dL (ref 0–99)
Total CHOL/HDL Ratio: 3.9 RATIO
Triglycerides: 75 mg/dL (ref <150)
VLDL: 15 mg/dL (ref 0–40)

## 2022-08-06 LAB — GLUCOSE, CAPILLARY: Glucose-Capillary: 89 mg/dL (ref 70–99)

## 2022-08-06 MED ORDER — DICLOFENAC SODIUM 1 % EX GEL
2.0000 g | Freq: Three times a day (TID) | CUTANEOUS | Status: DC
  Administered 2022-08-06 (×2): 2 g via TOPICAL
  Filled 2022-08-06: qty 100

## 2022-08-06 MED ORDER — PANTOPRAZOLE SODIUM 40 MG PO TBEC
40.0000 mg | DELAYED_RELEASE_TABLET | Freq: Every day | ORAL | Status: DC
  Administered 2022-08-06 – 2022-08-08 (×3): 40 mg via ORAL
  Filled 2022-08-06 (×4): qty 1

## 2022-08-06 MED ORDER — CHLORHEXIDINE GLUCONATE CLOTH 2 % EX PADS
6.0000 | MEDICATED_PAD | Freq: Every day | CUTANEOUS | Status: DC
  Administered 2022-08-06 – 2022-08-08 (×2): 6 via TOPICAL

## 2022-08-06 NOTE — Evaluation (Signed)
Physical Therapy Evaluation Patient Details Name: Corey Park MRN: [id number] DOB: [birth year] Today's Date: 08/06/2022  History of Present Illness  Pt is a 52 y/o male presenting on 2/9 with chest pain. EKG with sinus tachycardia, cardiology recommending stress test. PMH includes: HD MWF, CHF, HTN, PAH, DM2, blindness, intractable hiccups, GI bleed.  Clinical Impression  Pt moving well with his limitations. He is blind and in unfamiliar environment so will not be independent in this environment. Did well with guiding assistance and should be close to or at baseline with return home. Will sign off from PT standpoint and refer to mobility team for continued mobility while hospitalized.         Recommendations for follow up therapy are one component of a multi-disciplinary discharge planning process, led by the attending physician.  Recommendations may be updated based on patient status, additional functional criteria and insurance authorization.  Follow Up Recommendations No PT follow up      Assistance Recommended at Discharge Intermittent Supervision/Assistance  Patient can return home with the following  Assist for transportation    Equipment Recommendations None recommended by PT  Recommendations for Other Services       Functional Status Assessment Patient has not had a recent decline in their functional status     Precautions / Restrictions Precautions Precautions: Fall Precaution Comments: blind Restrictions Weight Bearing Restrictions: No      Mobility  Bed Mobility Overal bed mobility: Needs Assistance Bed Mobility: Supine to Sit, Sit to Supine     Supine to sit: Supervision Sit to supine: Supervision   General bed mobility comments: supervision only due to lines and unfamiliar environment    Transfers Overall transfer level: Needs assistance Equipment used: 1 person hand held assist Transfers: Sit to/from Stand Sit to Stand: Min guard            General transfer comment: HHA due to blindness and unfamiliar environment    Ambulation/Gait Ambulation/Gait assistance: Min guard Gait Distance (Feet): 300 Feet Assistive device:  (Hand on my shoulder for guidance due to blindness) Gait Pattern/deviations: Step-through pattern, Decreased stride length Gait velocity: decr Gait velocity interpretation: 1.31 - 2.62 ft/sec, indicative of limited community ambulator   General Gait Details: Assist due to unfamiliar and pt blind. Pt placed hand on may shoulder in order to be guided through unfamiliar environment  Stairs            Wheelchair Mobility    Modified Rankin (Stroke Patients Only)       Balance Overall balance assessment: No apparent balance deficits (not formally assessed)                                           Pertinent Vitals/Pain Pain Assessment Pain Assessment: Faces Faces Pain Scale: Hurts a little bit Pain Location: back Pain Descriptors / Indicators: Discomfort Pain Intervention(s): Limited activity within patient's tolerance, Repositioned    Home Living Family/patient expects to be discharged to:: Private residence Living Arrangements: Alone Available Help at Discharge: Family;Available PRN/intermittently Type of Home: Apartment Home Access: Stairs to enter Entrance Stairs-Rails: Left Entrance Stairs-Number of Steps: 6   Home Layout: One level Home Equipment: None Additional Comments: son stays "on campus" but can check on pt as needed    Prior Function Prior Level of Function : Independent/Modified Independent  Mobility Comments: independent, uses [employer] for transportation ADLs Comments: basin bathing at baseline, independent IADLs (family provides meals)     Hand Dominance        Extremity/Trunk Assessment   Upper Extremity Assessment Upper Extremity Assessment: Defer to OT evaluation    Lower Extremity Assessment Lower Extremity Assessment:  Overall WFL for tasks assessed    Cervical / Trunk Assessment Cervical / Trunk Assessment: Normal  Communication   Communication: No difficulties  Cognition Arousal/Alertness: Awake/alert Behavior During Therapy: WFL for tasks assessed/performed Overall Cognitive Status: Within Functional Limits for tasks assessed                                          General Comments General comments (skin integrity, edema, etc.): VSS on RA. SpO2 98% amb on RA.    Exercises     Assessment/Plan    PT Assessment Patient does not need any further PT services  PT Problem List         PT Treatment Interventions      PT Goals (Current goals can be found in the Care Plan section)  Acute Rehab PT Goals PT Goal Formulation: All assessment and education complete, DC therapy    Frequency       Co-evaluation               AM-PAC PT "6 Clicks" Mobility  Outcome Measure Help needed turning from your back to your side while in a flat bed without using bedrails?: None Help needed moving from lying on your back to sitting on the side of a flat bed without using bedrails?: A Little Help needed moving to and from a bed to a chair (including a wheelchair)?: A Little Help needed standing up from a chair using your arms (e.g., wheelchair or bedside chair)?: A Little Help needed to walk in hospital room?: A Little Help needed climbing 3-5 steps with a railing? : A Little 6 Click Score: 19    End of Session   Activity Tolerance: Patient tolerated treatment well Patient left: in bed;with call bell/phone within reach;with bed alarm set Nurse Communication: Mobility status PT Visit Diagnosis: Other abnormalities of gait and mobility (R26.89)    Time: [id number] PT Time Calculation (min) (ACUTE ONLY): 26 min   Charges:   PT Evaluation $PT Eval Moderate Complexity: [place] [facility] [place] 08/06/2022, 3:54 PM

## 2022-08-06 NOTE — Progress Notes (Signed)
Patient refused CBG for night shift. Order is for BID starting at 0800. Will monitor for S/S of hypo/hyperglycemia.

## 2022-08-06 NOTE — Hospital Course (Addendum)
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 His hospital course is detailed below:  Chest Pain  Patient presented with 1 day of crushing chest pain. Troponins peaked at 122. EKG unchanged from prior. Cardiology consulted and did not believe etiology to be ACS. Patient had reproducible chest pain with hiccups. PPI started on 2/10. Patient reported some hematemesis prior to admission; though, did not have any further episodes inpatient and had stable hgb. Started on aspirin 81 for suspected CAD. Lipid panel WNL, HDL of 38. Cardiology recommended outpatient stress test.  Cardiology has arranged follow-up in 2 weeks. Chest pain resolved and patient was stable and in good condition at time of discharge.  HFpEF BNP 579 which was less than patient has had in the past. Symptomatically and per exam, patient not in heart failure exacerbation. Volume managed through dialysis.   ESRD  Nephrology consulted and patient continued having MWF dialysis sessions. Rena-vit continued.   Other chronic conditions were medically managed with home medications and formulary alternatives as necessary (carvedilol, olmesartan)  PCP Follow-up Recommendations: F/u outpatient stress test  2. Recommend outpatient sleep study given hypoxia while sleeping in hospital 3. Call [phone]  to inquire about PCS application status

## 2022-08-06 NOTE — Plan of Care (Signed)

## 2022-08-06 NOTE — Evaluation (Signed)
Occupational Therapy Evaluation Patient Details Name: Corey Park MRN: [id number] DOB: May 27, 1971 Today's Date: 08/06/2022   History of Present Illness Pt is a 52 y/o male presenting on 2/9 with chest pain. EKG with sinus tachycardia, cardiology recommending stress test. PMH includes: HD MWF, CHF, HTN, PAH, DM2, blindness, intractable hiccups, GI bleed.   Clinical Impression   PTA patient independent and working at [employer], using [employer] for transportation.  He was admitted for above and presents with problem list below.  Pt reports fatigued from HD session yesterday, declining mobility or ADLS OOB.  Completes bed mobility with supervision, ADLs with setup to mod assist. On 2L [place] upon entry, VSS on RA and RN notified left on RA. Anticipate he will progress well. Based on performance today, pt will benefit from continued OT services acutely and after dc at [facility] level (pending progress) to return to PLOF.      Recommendations for follow up therapy are one component of a multi-disciplinary discharge planning process, led by the attending physician.  Recommendations may be updated based on patient status, additional functional criteria and insurance authorization.   Follow Up Recommendations  Home health OT (aide)     Assistance Recommended at Discharge Intermittent Supervision/Assistance  Patient can return home with the following A little help with walking and/or transfers;A lot of help with bathing/dressing/bathroom;Assistance with cooking/housework;Assist for transportation;Help with stairs or ramp for entrance    Functional Status Assessment  Patient has had a recent decline in their functional status and demonstrates the ability to make significant improvements in function in a reasonable and predictable amount of time.  Equipment Recommendations  None recommended by OT    Recommendations for Other Services PT consult     Precautions / Restrictions  Precautions Precautions: Fall Precaution Comments: blind Restrictions Weight Bearing Restrictions: No      Mobility Bed Mobility Overal bed mobility: Needs Assistance Bed Mobility: Supine to Sit, Sit to Supine     Supine to sit: Supervision Sit to supine: Supervision   General bed mobility comments: no assist required    Transfers                   General transfer comment: pt declined      Balance Overall balance assessment: No apparent balance deficits (not formally assessed)                                         ADL either performed or assessed with clinical judgement   ADL Overall ADL's : Needs assistance/impaired     Grooming: Set up;Sitting           Upper Body Dressing : Set up;Sitting   Lower Body Dressing: Moderate assistance;Sitting/lateral leans Lower Body Dressing Details (indicate cue type and reason): assist for socks today   Toilet Transfer Details (indicate cue type and reason): pt declined         Functional mobility during ADLs: Supervision/safety General ADL Comments: limited to EOB     Vision Baseline Vision/History: 2 Legally blind Additional Comments: pt blind     Perception     Praxis      Pertinent Vitals/Pain Pain Assessment Pain Assessment: Faces Faces Pain Scale: Hurts a little bit Pain Location: generalized, chest "from how I'm laying" Pain Descriptors / Indicators: Discomfort Pain Intervention(s): Limited activity within patient's tolerance, Monitored during session, Repositioned  Hand Dominance     Extremity/Trunk Assessment Upper Extremity Assessment Upper Extremity Assessment: Overall WFL for tasks assessed   Lower Extremity Assessment Lower Extremity Assessment: Defer to PT evaluation   Cervical / Trunk Assessment Cervical / Trunk Assessment: Normal   Communication Communication Communication: No difficulties   Cognition Arousal/Alertness: Awake/alert Behavior During  Therapy: WFL for tasks assessed/performed Overall Cognitive Status: Within Functional Limits for tasks assessed                                       General Comments  VSS on RA, pt session limited due to HD fatigue    Exercises     Shoulder Instructions      Home Living Family/patient expects to be discharged to:: Private residence Living Arrangements: Alone Available Help at Discharge: Family;Available PRN/intermittently Type of Home: Apartment Home Access: Stairs to enter Entrance Stairs-Number of Steps: 6 Entrance Stairs-Rails: Left Home Layout: One level     Bathroom Shower/Tub: Sponge bathes at baseline   Bathroom Toilet: Standard     Home Equipment: None   Additional Comments: son stays "on campus" but can check on pt as needed      Prior Functioning/Environment Prior Level of Function : Independent/Modified Independent             Mobility Comments: independent, uses [employer] for transportation ADLs Comments: basin bathing at baseline, independent IADLs (family provides meals)        OT Problem List: Decreased activity tolerance;Decreased knowledge of use of DME or AE;Decreased knowledge of precautions      OT Treatment/Interventions: Self-care/ADL training;DME and/or AE instruction;Energy conservation;Patient/family education;Therapeutic activities    OT Goals(Current goals can be found in the care plan section) Acute Rehab OT Goals Patient Stated Goal: home OT Goal Formulation: With patient Time For Goal Achievement: 08/20/22 Potential to Achieve Goals: Good  OT Frequency: Min 2X/week    Co-evaluation              AM-PAC OT "6 Clicks" Daily Activity     Outcome Measure Help from another person eating meals?: None Help from another person taking care of personal grooming?: A Little Help from another person toileting, which includes using toliet, bedpan, or urinal?: A Little Help from another person bathing (including washing,  rinsing, drying)?: A Lot Help from another person to put on and taking off regular upper body clothing?: A Little Help from another person to put on and taking off regular lower body clothing?: A Lot 6 Click Score: 17   End of Session Nurse Communication: Mobility status (O2 on RA)  Activity Tolerance: Patient tolerated treatment well Patient left: in bed;with call bell/phone within reach;with bed alarm set;with nursing/sitter in room  OT Visit Diagnosis: Other abnormalities of gait and mobility (R26.89);Muscle weakness (generalized) (M62.81)                Time: [id number] OT Time Calculation (min): 20 min Charges:  OT General Charges $OT Visit: 1 Visit OT Evaluation $OT Eval Moderate Complexity: [place], OT Acute Rehabilitation Services Office [phone]   Delight Stare 08/06/2022, 9:47 AM

## 2022-08-06 NOTE — Progress Notes (Signed)
[facility] Progress Note   Subjective:   Hiccups better but still having chest pain, seems to be positional. Feels mildly SOB but improved. No HA, dizziness or nausea.   Objective Vitals:   08/05/22 1830 08/05/22 1929 08/06/22 0005 08/06/22 0431  BP: (!) 147/89 137/77 134/80 128/78  Pulse: 72 79 87 80  Resp: (!) 22 20 20 $ (!) 21  Temp: 98.1 F (36.7 C) 98.8 F (37.1 C) 98.8 F (37.1 C) 98.8 F (37.1 C)  TempSrc: Oral Oral Oral Oral  SpO2: 100% 96% 95% 97%  Weight:    98.7 kg  Height:       Physical Exam General: WDWN male in NAD Heart: RRR, no murmurs, rubs or gallops Lungs: CTA bilaterally Abdomen: Soft, non-distended, +BS Extremities: No edema b/l lower extremities Dialysis Access:  RUE AVF + bruit  Additional Objective Labs: Basic Metabolic Panel: Recent Labs  Lab 08/05/22 0004 08/06/22 0706  NA 133* 134*  K 3.9 4.2  CL 89* 91*  CO2 27 27  GLUCOSE 198* 80  BUN 41* 27*  CREATININE 12.42* 10.11*  CALCIUM 8.2* 8.3*   Liver Function Tests: No results for input(s): "AST", "ALT", "ALKPHOS", "BILITOT", "PROT", "ALBUMIN" in the last 168 hours. No results for input(s): "LIPASE", "AMYLASE" in the last 168 hours. CBC: Recent Labs  Lab 08/05/22 0004 08/05/22 2146 08/06/22 0706  WBC 7.6  --  6.4  HGB 11.7* 11.5* 11.4*  HCT 35.4* 35.4* 35.7*  MCV 93.7  --  94.2  PLT 179  --  180   Blood Culture    Component Value Date/Time   SDES BLOOD SITE NOT SPECIFIED 04/17/2022 0720   SPECREQUEST  04/17/2022 0720    BOTTLES DRAWN AEROBIC AND ANAEROBIC Blood Culture adequate volume   CULT  04/17/2022 0720    NO GROWTH 5 DAYS Performed at [facility], [place] [address], [place], [place] [address]    REPTSTATUS 04/22/2022 FINAL 04/17/2022 0720    Cardiac Enzymes: No results for input(s): "CKTOTAL", "CKMB", "CKMBINDEX", "TROPONINI" in the last 168 hours. CBG: Recent Labs  Lab 08/04/22 2354 08/05/22 0840 08/06/22 0616  GLUCAP 201* 141* 89    Iron Studies: No results for input(s): "IRON", "TIBC", "TRANSFERRIN", "FERRITIN" in the last 72 hours. @lablastinr3$ @ Studies/Results: CT Angio Chest Pulmonary Embolism (PE) W or WO Contrast  Result Date: 08/05/2022 CLINICAL DATA:  Chest pain, shortness of breath EXAM: CT ANGIOGRAPHY CHEST WITH CONTRAST TECHNIQUE: Multidetector CT imaging of the chest was performed using the standard protocol during bolus administration of intravenous contrast. Multiplanar CT image reconstructions and MIPs were obtained to evaluate the vascular anatomy. RADIATION DOSE REDUCTION: This exam was performed according to the departmental dose-optimization program which includes automated exposure control, adjustment of the mA and/or kV according to patient size and/or use of iterative reconstruction technique. CONTRAST:  21m OMNIPAQUE IOHEXOL 350 MG/ML SOLN COMPARISON:  04/16/2022 FINDINGS: Cardiovascular: No filling defects in the pulmonary arteries to suggest pulmonary emboli. Cardiomegaly. Diffuse coronary artery calcifications. Moderate aortic calcifications. No aneurysm. Mediastinum/Nodes: Mildly enlarged mediastinal lymph nodes. Subcarinal node has a short axis diameter of 18 mm. Right paratracheal node has a short axis diameter of 10 mm. No hilar or axillary adenopathy. Trachea and esophagus are unremarkable. Thyroid unremarkable. Lungs/Pleura: No confluent opacities or effusions. Upper Abdomen: Imaging into the upper abdomen demonstrates no acute findings. Musculoskeletal: Chest wall soft tissues are unremarkable. No acute bony abnormality. Review of the MIP images confirms the above findings. IMPRESSION: No evidence of pulmonary embolus.  Cardiomegaly, coronary artery disease. No acute cardiopulmonary disease. Aortic Atherosclerosis (ICD10-I70.0). Electronically Signed   By: Rolm Baptise M.D.   On: 08/05/2022 02:13   DG Chest 2 View  Result Date: 08/05/2022 CLINICAL DATA:  Chest pain EXAM: CHEST - 2 VIEW COMPARISON:   04/16/2022 FINDINGS: Cardiomegaly. No confluent airspace opacities, effusions or edema. No acute bony abnormality. IMPRESSION: Cardiomegaly.  No active disease. Electronically Signed   By: Rolm Baptise M.D.   On: 08/05/2022 01:06   Medications:   aspirin EC  81 mg Oral Daily   [START ON 08/07/2022] calcitRIOL  1.25 mcg Oral Q M,W,F-HD   calcium acetate  2,001 mg Oral TID WC   carvedilol  25 mg Oral BID   Chlorhexidine Gluconate Cloth  6 each Topical Q0600   gabapentin  100 mg Oral TID   heparin  5,000 Units Subcutaneous Q8H   multivitamin  1 tablet Oral Daily   pantoprazole (PROTONIX) IV  40 mg Intravenous QHS    Dialysis Orders: Center: [facility]  on MWF . 180NRe 4 hours BFR 450 DFR Auto 1.5 EDW 100kg 2K 2CaAVF 15g Heparin 6000 unit bolus Mircera 29mg IV q 2 weeks Venofer 539mweekly Calcitriol 1.2542mPO q HD Sensipar 65m23mHD   BP meds: coreg 35mg45m, olmesartan 20mg 61my Binder: Calcium acetate 3 tabs per meal  Assessment/Plan:  Chest pain: Crushing chest pain which he thinks is related to hiccups/is positional. Cardiology consulted by primary team, suspected to be non-cardiac  ESRD:  Dialyzes on MWF schedule, missed Friday due to chest pain. Tolerated HD yesterday, next HD Monday  Hypertension/volume: Typically has very large UF goals outpatient (6-8L). BP elevated but no evidence of volume overload on exam or CT chest. Below his outpatient EDW and poor PO intake but is eating more today. UFG 2L with HD tomorrow.   Anemia: Hgb at goal, no ESA needed at this time   Metabolic bone disease: Calcium controlled. Will continue calcitriol. Resumed sensipar and binders  Nutrition:  Renal diet with fluid restrictions Dispo: needs more help at home, [facility]  SamantAnice Paganini 08/06/2022, 7:52 AM  [facility] Pager: [phone]

## 2022-08-06 NOTE — Progress Notes (Addendum)
Daily Progress Note Intern Pager: [phone]  Patient name: Corey Park Medical record number: [id number] Date of birth: 1971-06-08 Age: 52 y.o. Gender: male  Primary Care Provider: Leslie Dales, DO Consultants: Cardiology, nephrology Code Status: FULL  Pt Overview and Major Events to Date:  2/10 admitted  Assessment and Plan: Corey Park is a 52 y.o. male presenting to the ED with chest pain. PMHx of ESRD on HD MWF, HFpEF (55%, previously 25%), HTN, HLD, PAH, diet controlled DM type II, blindness, and history of GI bleed.    * Non-cardiac chest pain Presented with 1 day of crushing, sharp pain located in anterior L chest. Troponin 119 >122. EKG unchanged from prior. Less likely ACS per cardiology. Possible GERD-PPI started yesterday. Reproducible chest wall pain-says worse with hiccups-add topical therapy for him. ?Hematemesis mentioned in cardiology notes-hgb stable, no further episodes - admit to [facility], med-tele, attending Dr. Thompson Grayer - cardiology following-outpatient stress test fine per cards - continue home aspirin 81 mg daily - f/u lipid panel -PPI - PT/OT eval and treat  ESRD on hemodialysis ([place]) Received HD session yesterday d/t missed outpatient session. HD MWF - nephrology consulted, appreciate recs - continue rena-vit  (HFpEF) heart failure with preserved ejection fraction ([place]) Does not appear to be in acute exacerbation. Echo 03/2022 shows EF 55% with L basal inferior hypokinesis and mild concentric left ventricular hypertrophy (improved from prior EF of 25%). BNP 579 (has bene in the 1000s in past) - cardiology consult as above - volume management with HD  HTN (hypertension) Systolics [id number] - [id number] this morning - continue home carvedilol 25 mg BID  Need for assistance at home without other household member able to render care Son unable to adequately care for him at home. He is legally blind. Requesting help with cooking, cleaning house, and doing  laundry - TOC consulted, appreciate recs   FEN/GI: Heart PPx: heparin q8h Dispo: Pending PT/OT recs, cardiology recs possible d/c today or tomorrow  Subjective:  Removed oxygen from patient's nose this morning.  Objective: Temp:  [98.1 F (36.7 C)-98.8 F (37.1 C)] 98.6 F (37 C) (02/11 0812) Pulse Rate:  [69-88] 79 (02/11 0812) Resp:  [13-22] 20 (02/11 0812) BP: (127-153)/(70-90) 127/70 (02/11 0812) SpO2:  [94 %-100 %] 96 % (02/11 0812) Weight:  [98.7 kg] 98.7 kg (02/11 0431) Physical Exam: General: NAD, laying in bed comfortably Cardiovascular: RRR no m/r/g Respiratory: CTAB no w/r/c no iWOB on RA (took Ider off and stable O2), reproducible chest wall pain on left side Abdomen: Soft, nontender to palpation Extremities: No LE edema  Laboratory: Most recent CBC Lab Results  Component Value Date   WBC 6.4 08/06/2022   HGB 11.4 (L) 08/06/2022   HCT 35.7 (L) 08/06/2022   MCV 94.2 08/06/2022   PLT 180 08/06/2022   Most recent BMP    Latest Ref Rng & Units 08/06/2022    7:06 AM  BMP  Glucose 70 - 99 mg/dL 80   BUN 6 - 20 mg/dL 27   Creatinine 0.61 - 1.24 mg/dL 10.11   Sodium 135 - 145 mmol/L 134   Potassium 3.5 - 5.1 mmol/L 4.2   Chloride 98 - 111 mmol/L 91   CO2 22 - 32 mmol/L 27   Calcium 8.9 - 10.3 mg/dL 8.3    Other pertinent labs A1c 4.7   Imaging/Diagnostic Tests: CTPE negative CXR Lorrin Goodell, MD 08/06/2022, 12:01 PM  PGY-2, [place] Intern pager: [phone], text pages welcome  Secure chat group [facility] Teaching Service

## 2022-08-06 NOTE — Progress Notes (Signed)
Called x 2 mother to update her and she responded "STOP CALLING" and hung up phone.

## 2022-08-06 NOTE — Progress Notes (Signed)
Patient placed on 2L oxygen due to desaturation during sleep and apnea episodes causing rapid decline in oxygen saturation.

## 2022-08-06 NOTE — Progress Notes (Signed)
Progress Note  Patient Name: Corey Park Date of Encounter: 08/06/2022  Primary Cardiologist: Candee Furbish, MD  Subjective   No acute events overnight. Patient continues to have chest pain that worsens with deep breath and gets better when he changes his position from left lateral to right lateral position. No chest pain with exertion.  Inpatient Medications    Scheduled Meds:  aspirin EC  81 mg Oral Daily   [START ON 08/07/2022] calcitRIOL  1.25 mcg Oral Q M,W,F-HD   calcium acetate  2,001 mg Oral TID WC   carvedilol  25 mg Oral BID   Chlorhexidine Gluconate Cloth  6 each Topical Q0600   Chlorhexidine Gluconate Cloth  6 each Topical Q0600   gabapentin  100 mg Oral TID   heparin  5,000 Units Subcutaneous Q8H   multivitamin  1 tablet Oral Daily   pantoprazole (PROTONIX) IV  40 mg Intravenous QHS   Continuous Infusions:  PRN Meds:    Vital Signs    Vitals:   08/06/22 0005 08/06/22 0431 08/06/22 0811 08/06/22 0812  BP: 134/80 128/78  127/70  Pulse: 87 80 80 79  Resp: 20 (!) 21 19 20  $ Temp: 98.8 F (37.1 C) 98.8 F (37.1 C)  98.6 F (37 C)  TempSrc: Oral Oral  Oral  SpO2: 95% 97% 95% 96%  Weight:  98.7 kg    Height:        Intake/Output Summary (Last 24 hours) at 08/06/2022 1059 Last data filed at 08/05/2022 2300 Gross per 24 hour  Intake 240 ml  Output 2100 ml  Net -1860 ml   Filed Weights   08/05/22 0607 08/06/22 0431  Weight: 97 kg 98.7 kg    Telemetry     Personally reviewed, normal sinus rhythm  ECG    EKG not performed today  Physical Exam   GEN: No acute distress.   Neck: No JVD. Cardiac: RRR, no murmur, rub, or gallop.  Respiratory: Nonlabored. Clear to auscultation bilaterally. GI: Soft, nontender, bowel sounds present. MS: No edema; No deformity. Neuro:  Nonfocal. Psych: Alert and oriented x 3. Normal affect.  Labs    Chemistry Recent Labs  Lab 08/05/22 0004 08/06/22 0706  NA 133* 134*  K 3.9 4.2  CL 89* 91*  CO2 27 27   GLUCOSE 198* 80  BUN 41* 27*  CREATININE 12.42* 10.11*  CALCIUM 8.2* 8.3*  GFRNONAA 4* 6*  ANIONGAP 17* 16*     Hematology Recent Labs  Lab 08/05/22 0004 08/05/22 2146 08/06/22 0706  WBC 7.6  --  6.4  RBC 3.78*  --  3.79*  HGB 11.7* 11.5* 11.4*  HCT 35.4* 35.4* 35.7*  MCV 93.7  --  94.2  MCH 31.0  --  30.1  MCHC 33.1  --  31.9  RDW 15.9*  --  16.2*  PLT 179  --  180    Cardiac Enzymes Recent Labs  Lab 08/05/22 0004 08/05/22 0232  TROPONINIHS 119* 122*    BNP Recent Labs  Lab 08/05/22 0004  BNP 579.2*     DDimerNo results for input(s): "DDIMER" in the last 168 hours.   Radiology    CT Angio Chest Pulmonary Embolism (PE) W or WO Contrast  Result Date: 08/05/2022 CLINICAL DATA:  Chest pain, shortness of breath EXAM: CT ANGIOGRAPHY CHEST WITH CONTRAST TECHNIQUE: Multidetector CT imaging of the chest was performed using the standard protocol during bolus administration of intravenous contrast. Multiplanar CT image reconstructions and MIPs were obtained to evaluate the  vascular anatomy. RADIATION DOSE REDUCTION: This exam was performed according to the departmental dose-optimization program which includes automated exposure control, adjustment of the mA and/or kV according to patient size and/or use of iterative reconstruction technique. CONTRAST:  8m OMNIPAQUE IOHEXOL 350 MG/ML SOLN COMPARISON:  04/16/2022 FINDINGS: Cardiovascular: No filling defects in the pulmonary arteries to suggest pulmonary emboli. Cardiomegaly. Diffuse coronary artery calcifications. Moderate aortic calcifications. No aneurysm. Mediastinum/Nodes: Mildly enlarged mediastinal lymph nodes. Subcarinal node has a short axis diameter of 18 mm. Right paratracheal node has a short axis diameter of 10 mm. No hilar or axillary adenopathy. Trachea and esophagus are unremarkable. Thyroid unremarkable. Lungs/Pleura: No confluent opacities or effusions. Upper Abdomen: Imaging into the upper abdomen demonstrates  no acute findings. Musculoskeletal: Chest wall soft tissues are unremarkable. No acute bony abnormality. Review of the MIP images confirms the above findings. IMPRESSION: No evidence of pulmonary embolus. Cardiomegaly, coronary artery disease. No acute cardiopulmonary disease. Aortic Atherosclerosis (ICD10-I70.0). Electronically Signed   By: KRolm BaptiseM.D.   On: 08/05/2022 02:13   DG Chest 2 View  Result Date: 08/05/2022 CLINICAL DATA:  Chest pain EXAM: CHEST - 2 VIEW COMPARISON:  04/16/2022 FINDINGS: Cardiomegaly. No confluent airspace opacities, effusions or edema. No acute bony abnormality. IMPRESSION: Cardiomegaly.  No active disease. Electronically Signed   By: KRolm BaptiseM.D.   On: 08/05/2022 01:06     Assessment & Plan    Mr. CBrocatois a 52y/o M known to have hypertensive heart disease with HF improved LVEF (from 30% in 2019 to 55% in 2023), ESRD DD, HTN, intractable hiccups, DM2 presented to the ER with chest pain.   # Non cardiac chest pain #Acute myocardial injury with no evidence of myocardial infarction -Chest pain x 2 days now, occurs at rest, occurs especially when he takes a deep breath and gets better when he switches his position from left lateral to right lateral position. EKG showed sinus tachycardia with no ST changes. High-sensitivity troponins 100s x2. He will need nuclear stress test for further evaluation of elevated troponins. If patient has anticipated prolonged hospitalization, can consider inpatient stress test. Otherwise can consider outpatient stress test. -Follow-up with cardiology within 2 weeks upon discharge  # HF improved LVEF # Hypertensive heart disease -Continue Coreg 12.5 mg daily -Echo showed improved LVEF after HTN control  # Hematemesis, self-reported -Patient reported hematemesis episode x 1, 2 days prior to presentation.  Consider GI evaluation.  I have spent a total of 33 minutes with patient reviewing chart , telemetry, EKGs, labs and  examining patient as well as establishing an assessment and plan that was discussed with the patient.  > 50% of time was spent in direct patient care.    Signed, VChalmers Guest MD  08/06/2022, 10:59 AM

## 2022-08-07 ENCOUNTER — Other Ambulatory Visit (HOSPITAL_COMMUNITY): Payer: Self-pay

## 2022-08-07 ENCOUNTER — Other Ambulatory Visit: Payer: Self-pay | Admitting: Cardiology

## 2022-08-07 ENCOUNTER — Telehealth: Payer: Self-pay | Admitting: Cardiology

## 2022-08-07 DIAGNOSIS — R079 Chest pain, unspecified: Secondary | ICD-10-CM

## 2022-08-07 DIAGNOSIS — R071 Chest pain on breathing: Secondary | ICD-10-CM

## 2022-08-07 DIAGNOSIS — R0789 Other chest pain: Secondary | ICD-10-CM | POA: Diagnosis not present

## 2022-08-07 LAB — RENAL FUNCTION PANEL
Albumin: 2.9 g/dL — ABNORMAL LOW (ref 3.5–5.0)
Anion gap: 16 — ABNORMAL HIGH (ref 5–15)
BUN: 39 mg/dL — ABNORMAL HIGH (ref 6–20)
CO2: 25 mmol/L (ref 22–32)
Calcium: 8.3 mg/dL — ABNORMAL LOW (ref 8.9–10.3)
Chloride: 95 mmol/L — ABNORMAL LOW (ref 98–111)
Creatinine, Ser: 12.45 mg/dL — ABNORMAL HIGH (ref 0.61–1.24)
GFR, Estimated: 4 mL/min — ABNORMAL LOW (ref >60)
Glucose, Bld: 171 mg/dL — ABNORMAL HIGH (ref 70–99)
Phosphorus: 6.8 mg/dL — ABNORMAL HIGH (ref 2.5–4.6)
Potassium: 4.2 mmol/L (ref 3.5–5.1)
Sodium: 136 mmol/L (ref 135–145)

## 2022-08-07 LAB — CBC
HCT: 34.3 % — ABNORMAL LOW (ref 39.0–52.0)
Hemoglobin: 11.2 g/dL — ABNORMAL LOW (ref 13.0–17.0)
MCH: 30.9 pg (ref 26.0–34.0)
MCHC: 32.7 g/dL (ref 30.0–36.0)
MCV: 94.5 fL (ref 80.0–100.0)
Platelets: 197 10*3/uL (ref 150–400)
RBC: 3.63 MIL/uL — ABNORMAL LOW (ref 4.22–5.81)
RDW: 16.2 % — ABNORMAL HIGH (ref 11.5–15.5)
WBC: 7 10*3/uL (ref 4.0–10.5)
nRBC: 0 % (ref 0.0–0.2)

## 2022-08-07 LAB — GLUCOSE, CAPILLARY: Glucose-Capillary: 189 mg/dL — ABNORMAL HIGH (ref 70–99)

## 2022-08-07 MED ORDER — ASPIRIN 81 MG PO TBEC
81.0000 mg | DELAYED_RELEASE_TABLET | Freq: Every day | ORAL | 0 refills | Status: DC
  Filled 2022-08-07: qty 30, 30d supply, fill #0

## 2022-08-07 MED ORDER — DICLOFENAC SODIUM 1 % EX GEL
2.0000 g | Freq: Three times a day (TID) | CUTANEOUS | 0 refills | Status: DC
  Filled 2022-08-07: qty 100, 17d supply, fill #0

## 2022-08-07 MED ORDER — PANTOPRAZOLE SODIUM 40 MG PO TBEC
40.0000 mg | DELAYED_RELEASE_TABLET | Freq: Every day | ORAL | 0 refills | Status: DC
  Filled 2022-08-07: qty 30, 30d supply, fill #0

## 2022-08-07 MED ORDER — OLMESARTAN MEDOXOMIL 20 MG PO TABS
20.0000 mg | ORAL_TABLET | Freq: Every day | ORAL | 0 refills | Status: DC
  Filled 2022-08-07: qty 30, 30d supply, fill #0
  Filled 2022-08-07: qty 30, 60d supply, fill #0

## 2022-08-07 NOTE — Progress Notes (Signed)
   08/07/22 1326  Vitals  Pulse Rate 80  Resp 20  BP 131/62  SpO2 98 %  O2 Device Room Air  Weight 99.1 kg  Type of Weight Post-Dialysis  Oxygen Therapy  Patient Activity (if Appropriate) In bed  Pulse Oximetry Type Continuous  Oximetry Probe Site Changed Yes  Post Treatment  Dialyzer Clearance Lightly streaked  Duration of HD Treatment -hour(s) 3 hour(s)  Hemodialysis Intake (mL) 0 mL  Liters Processed 63.3  Fluid Removed (mL) 1900 mL  Tolerated HD Treatment Yes  AVG/AVF Arterial Site Held (minutes) 10 minutes  AVG/AVF Venous Site Held (minutes) 10 minutes   Received patient in bed to unit.  Alert and oriented.  Informed consent signed and in chart.   TX duration:3 hours per dx order  Patient tolerated well.  Transported back to the room  Alert, without acute distress.  Hand-off given to patient's nurse.   Access used: RUA AVF Access issues: no complications  Total UF removed: 1900 Medication(s) given: none   [facility]

## 2022-08-07 NOTE — Telephone Encounter (Signed)
MD requesting outpt Lexiscan for eval s/p chest pain with elevated troponins.  OK to order?

## 2022-08-07 NOTE — Progress Notes (Signed)
Patient just back from HD- d/c order placed- CM spoke with pt at bedside- per pt he usually uses [employer] for transport to HD and work, etc. He also states he uses a Technical sales engineer, ([employer], [employer] or [employer]) and is ok with using a Taxi to transport home- voiced that he has no other means for transport home. Pt does however state that he is uncomfortable discharging home in the dark and asks if he could discharge in the am if he can not transport home prior to dark.  Note meds ordered from [facility].   Pt also asked about PCS services w/ Medicaid- and his PCP is suppose to send in paperwork for some assist at home. CM will send msg to PCP to request they f/u in the clinic.   TOC will follow up in am to arrange taxi transport home- confirmed pt does have his keys with him in patient belonging bag.

## 2022-08-07 NOTE — Telephone Encounter (Signed)
  Dr. Herb Grays called, she is discharging a pt. She said, pt needs lexiscan stress test, if Dr. Marlou Porch can place the order

## 2022-08-07 NOTE — Discharge Instructions (Signed)
Dear Corey Park,   Thank you so much for allowing [place] to be part of your care!  You were admitted to [facility] for chest pain - this was found to not be related to a heart issue.   POST-HOSPITAL & CARE INSTRUCTIONS Outpatient stress test with cardiology You need a sleep study outpatient Please let PCP/Specialists know of any changes that were made.  Please see medications section of this packet for any medication changes.   DOCTOR'S APPOINTMENT & FOLLOW UP CARE INSTRUCTIONS  Future Appointments  Date Time Provider [place]  08/10/2022  2:10 PM Leslie Dales, DO [facility] [place]    RETURN PRECAUTIONS: Crushing chest pain, shortness of breath  Take care and be well!  [facility]  [place], [place] [address] [phone]

## 2022-08-07 NOTE — Procedures (Signed)
Patient seen on Hemodialysis. BP (!) 159/82   Pulse 82   Temp 98.5 F (36.9 C) (Oral)   Resp 16   Ht 6' 1"$  (1.854 m)   Wt 98.7 kg   SpO2 99%   BMI 28.71 kg/m   QB 400, UF goal 2L Tolerating treatment without complaints at this time.   Elmarie Shiley MD [facility]. Office # [phone] Pager # [phone] 9:50 AM

## 2022-08-07 NOTE — Progress Notes (Cosign Needed)
Spoke with Caren Griffins on phone from [facility] with patient at bedside.  She reports that he was started on olmesartan 20 mg by Dr. Joelyn Oms.  Per chart review this is correct, he was not receiving this during his hospital stay.  Patient is requesting this medication and others be sent to [facility] prior to discharge.

## 2022-08-07 NOTE — Progress Notes (Signed)
HD tx time adjusted to 3hrs per MD orders

## 2022-08-07 NOTE — Progress Notes (Deleted)
Patient ID: Corey Park, male   DOB: 11/15/1970, 52 y.o.   MRN: [id number]  Plans are noted for discharge home today.  His outpatient dialysis schedule would be able to accommodate him on his usual dialysis schedule today at 4 PM (third shift at [facility]).  I recommend that he be discharged home earlier in the day for him to make it to his outpatient appointment rather than undergo inpatient hemodialysis here in the hospital.  Elmarie Shiley MD [facility]. Office # [phone] Pager # [phone] 9:04 AM

## 2022-08-07 NOTE — Progress Notes (Signed)
     Daily Progress Note Intern Pager: [phone]  Patient name: Corey Park Medical record number: [id number] Date of birth: [birth year] Age: 52 y.o. Gender: male  Primary Care Provider: Leslie Dales, DO Consultants: Nephrology, cardiology Code Status: Full code  Pt Overview and Major Events to Date:  2/10: Admitted  Assessment and Plan: Corey Park is a 52 y.o. male presenting to the ED with chest pain. PMHx of ESRD on HD MWF, HFpEF (55%, previously 25%), HTN, HLD, PAH, diet controlled DM type II, blindness, and history of GI bleed.    Corey Park is medically stable for discharge after hemodialysis today.  Hoping for TOC to see prior to discharge, to assist with home aide.  PCP sent initial paperwork, however no progress has been made towards home aide.  * Non-cardiac chest pain Currently resolved.  ACS ruled out.  PPI started for suspected GERD.  Reproducible chest wall pain, started Voltaren gel.  No further episodes of vomiting.  OT recommends home aide. - Outpatient cardiology follow-up - Outpatient stress test  ESRD on hemodialysis [facility]) Needs to receive dialysis today before discharge. HD MWF - nephrology consulted, appreciate recs - continue rena-vit  (HFpEF) heart failure with preserved ejection fraction (HCC) Stable.  Volume managed with HD. - Follow-up cardiology outpatient  HTN (hypertension) Systolics 563J this morning, likely to come down with HD today. - continue home carvedilol 25 mg BID  Need for assistance at home without other household member able to render care Son unable to adequately care for him at home. He is legally blind. Requesting help with cooking, cleaning house, and doing laundry.  - TOC consulted, appreciate recs   FEN/GI: Heart PPx: Heparin Dispo: Home today  Subjective:  Corey Park reports being in normal state of health, ready to return home.  Objective: Temp:  [98 F (36.7 C)-98.5 F (36.9 C)] 98.5 F (36.9 C) (02/12  0800) Pulse Rate:  [73-88] 82 (02/12 0825) Resp:  [14-24] 16 (02/12 0825) BP: (115-159)/(64-82) 159/82 (02/12 0825) SpO2:  [91 %-100 %] 99 % (02/12 0825) Physical Exam: General: NAD, pleasant Cardiovascular: RRR no murmurs Respiratory: CTAB, normal work of breathing on room air Abdomen: Soft, not tender, not distended.  Bowel sounds present Skin: Warm and dry  Laboratory: Most recent CBC Lab Results  Component Value Date   WBC 7.0 08/07/2022   HGB 11.2 (L) 08/07/2022   HCT 34.3 (L) 08/07/2022   MCV 94.5 08/07/2022   PLT 197 08/07/2022   Most recent BMP    Latest Ref Rng & Units 08/07/2022    6:34 AM  BMP  Glucose 70 - 99 mg/dL 171   BUN 6 - 20 mg/dL 39   Creatinine 0.61 - 1.24 mg/dL 12.45   Sodium 135 - 145 mmol/L 136   Potassium 3.5 - 5.1 mmol/L 4.2   Chloride 98 - 111 mmol/L 95   CO2 22 - 32 mmol/L 25   Calcium 8.9 - 10.3 mg/dL 8.3     Corey Dales, DO 08/07/2022, 8:49 AM  PGY-1, [place] Intern pager: [phone], text pages welcome Secure chat group [place]

## 2022-08-07 NOTE — Progress Notes (Signed)
Patient ID: Corey Park, male   DOB: [birth year], 52 y.o.   MRN: [id number] [facility] Progress Note   Assessment/ Plan:   1.  Atypical chest pain: Currently reports to be free from chest pain and plans noted for myocardial perfusion scan by primary service.  Seen earlier by cardiology and pain felt to be noncardiac in etiology. 2. ESRD: Continue hemodialysis on Monday/Wednesday/Friday schedule with plan for hemodialysis this morning.  Ideally, would have been suitable for discharge home to undertake dialysis at his outpatient kidney center later today (third shift at [facility]). 3. Anemia: Without overt loss, hemoglobin and hematocrit currently at goal and not on ESA. 4. CKD-MBD: Continue calcitriol and Sensipar for PTH control.  Elevated phosphorus levels on renal diet and calcium acetate; spotty adherence noted as an outpatient. 5. Nutrition: Continue renal diet with nutritional supplementation as indicated. 6. Hypertension: Blood pressure marginally elevated, continue to monitor with ongoing antihypertensive therapy and HD/UF.  Subjective:   Reports to be feeling fair and denies any chest pain or shortness of breath at this time.   Objective:   BP (!) 159/82   Pulse 82   Temp 98.5 F (36.9 C) (Oral)   Resp 16   Ht 6' 1"$  (1.854 m)   Wt 98.7 kg   SpO2 99%   BMI 28.71 kg/m   Physical Exam: Gen: Comfortably resting in bed, easy to awaken and engage in conversation CVS: Pulse regular rhythm, normal rate, S1 and S2 normal Resp: Clear to auscultation bilaterally, no rales/rhonchi Abd: Soft, obese, nontender, bowel sounds normal Ext: Right upper arm AV fistula with palpable thrill, no lower extremity edema  Labs: BMET Recent Labs  Lab 08/05/22 0004 08/06/22 0706 08/07/22 0634  NA 133* 134* 136  K 3.9 4.2 4.2  CL 89* 91* 95*  CO2 27 27 25  $ GLUCOSE 198* 80 171*  BUN 41* 27* 39*  CREATININE 12.42* 10.11* 12.45*  CALCIUM 8.2* 8.3* 8.3*  PHOS   --   --  6.8*   CBC Recent Labs  Lab 08/05/22 0004 08/05/22 2146 08/06/22 0706 08/07/22 0634  WBC 7.6  --  6.4 7.0  HGB 11.7* 11.5* 11.4* 11.2*  HCT 35.4* 35.4* 35.7* 34.3*  MCV 93.7  --  94.2 94.5  PLT 179  --  180 197      Medications:     aspirin EC  81 mg Oral Daily   calcitRIOL  1.25 mcg Oral Q M,W,F-HD   calcium acetate  2,001 mg Oral TID WC   carvedilol  25 mg Oral BID   Chlorhexidine Gluconate Cloth  6 each Topical Q0600   Chlorhexidine Gluconate Cloth  6 each Topical Q0600   diclofenac Sodium  2 g Topical TID   gabapentin  100 mg Oral TID   heparin  5,000 Units Subcutaneous Q8H   multivitamin  1 tablet Oral Daily   pantoprazole  40 mg Oral Daily   Elmarie Shiley, MD 08/07/2022, 9:08 AM

## 2022-08-07 NOTE — Progress Notes (Addendum)
Rounding Note    Patient Name: Corey Park Date of Encounter: 08/07/2022  [facility] Cardiologist: Candee Furbish, MD   Subjective   Patient is a reluctant participant in HPI this morning. Appears to be quite tired. He denies chest pain, shortness of breath, palpitations.  Inpatient Medications    Scheduled Meds:  aspirin EC  81 mg Oral Daily   calcitRIOL  1.25 mcg Oral Q M,W,F-HD   calcium acetate  2,001 mg Oral TID WC   carvedilol  25 mg Oral BID   Chlorhexidine Gluconate Cloth  6 each Topical Q0600   Chlorhexidine Gluconate Cloth  6 each Topical Q0600   diclofenac Sodium  2 g Topical TID   gabapentin  100 mg Oral TID   heparin  5,000 Units Subcutaneous Q8H   multivitamin  1 tablet Oral Daily   pantoprazole  40 mg Oral Daily   Continuous Infusions:  PRN Meds:    Vital Signs    Vitals:   08/07/22 0930 08/07/22 0950 08/07/22 1000 08/07/22 1030  BP: (!) 127/57 128/61 (!) 160/64 (!) 149/77  Pulse: 80 80 80 79  Resp: 16 16 16 20  $ Temp: 97.8 F (36.6 C)     TempSrc: Oral     SpO2: 100% 100% 100%   Weight:      Height:        Intake/Output Summary (Last 24 hours) at 08/07/2022 1106 Last data filed at 08/06/2022 2100 Gross per 24 hour  Intake 720 ml  Output 1 ml  Net 719 ml      08/06/2022    4:31 AM 08/05/2022    6:07 AM 07/11/2022   11:12 AM  Last 3 Weights  Weight (lbs) 217 lb 9.5 oz 213 lb 13.5 oz 223 lb 3.2 oz  Weight (kg) 98.7 kg 97 kg 101.243 kg      Telemetry    Sinus rhythm - Personally Reviewed  ECG    No new tracing - Personally Reviewed  Physical Exam   GEN: No acute distress.   Neck: No JVD Cardiac: RRR, no murmurs, rubs, or gallops.  Respiratory: Clear to auscultation bilaterally. Anterior exam only due to dialysis. GI: Soft, nontender, non-distended  MS: No edema; No deformity. Neuro:  Nonfocal  Psych: Normal affect   Labs    High Sensitivity Troponin:   Recent Labs  Lab 08/05/22 0004 08/05/22 0232   TROPONINIHS 119* 122*     Chemistry Recent Labs  Lab 08/05/22 0004 08/05/22 1011 08/06/22 0706 08/07/22 0634  NA 133*  --  134* 136  K 3.9  --  4.2 4.2  CL 89*  --  91* 95*  CO2 27  --  27 25  GLUCOSE 198*  --  80 171*  BUN 41*  --  27* 39*  CREATININE 12.42*  --  10.11* 12.45*  CALCIUM 8.2*  --  8.3* 8.3*  MG  --  1.9  --   --   ALBUMIN  --   --   --  2.9*  GFRNONAA 4*  --  6* 4*  ANIONGAP 17*  --  16* 16*    Lipids  Recent Labs  Lab 08/06/22 0706  CHOL 147  TRIG 75  HDL 38*  LDLCALC 94  CHOLHDL 3.9    Hematology Recent Labs  Lab 08/05/22 0004 08/05/22 2146 08/06/22 0706 08/07/22 0634  WBC 7.6  --  6.4 7.0  RBC 3.78*  --  3.79* 3.63*  HGB 11.7* 11.5* 11.4* 11.2*  HCT 35.4* 35.4* 35.7* 34.3*  MCV 93.7  --  94.2 94.5  MCH 31.0  --  30.1 30.9  MCHC 33.1  --  31.9 32.7  RDW 15.9*  --  16.2* 16.2*  PLT 179  --  180 197   Thyroid No results for input(s): "TSH", "FREET4" in the last 168 hours.  BNP Recent Labs  Lab 08/05/22 0004  BNP 579.2*    DDimer No results for input(s): "DDIMER" in the last 168 hours.   Radiology    No results found.  Cardiac Studies   04/17/22 TTE  IMPRESSIONS     1. Left ventricular ejection fraction, by estimation, is 55%. The left  ventricle has normal function. The left ventricle demonstrates regional  wall motion abnormalities with basal inferior hypokinesis. There is mild  concentric left ventricular  hypertrophy. Left ventricular diastolic parameters are consistent with  Grade II diastolic dysfunction (pseudonormalization).   2. Right ventricular systolic function is normal. The right ventricular  size is normal. Tricuspid regurgitation signal is inadequate for assessing  PA pressure.   3. The mitral valve is normal in structure. Trivial mitral valve  regurgitation. No evidence of mitral stenosis.   4. The aortic valve is tricuspid. Aortic valve regurgitation is not  visualized. No aortic stenosis is present.    5. The inferior vena cava is normal in size with greater than 50%  respiratory variability, suggesting right atrial pressure of 3 mmHg.   FINDINGS   Left Ventricle: Left ventricular ejection fraction, by estimation, is  55%. The left ventricle has normal function. The left ventricle  demonstrates regional wall motion abnormalities. The left ventricular  internal cavity size was normal in size. There is   mild concentric left ventricular hypertrophy. Left ventricular diastolic  parameters are consistent with Grade II diastolic dysfunction  (pseudonormalization).   Right Ventricle: The right ventricular size is normal. No increase in  right ventricular wall thickness. Right ventricular systolic function is  normal. Tricuspid regurgitation signal is inadequate for assessing PA  pressure.   Left Atrium: Left atrial size was normal in size.   Right Atrium: Right atrial size was normal in size.   Pericardium: There is no evidence of pericardial effusion.   Mitral Valve: The mitral valve is normal in structure. Trivial mitral  valve regurgitation. No evidence of mitral valve stenosis. MV peak  gradient, 5.0 mmHg. The mean mitral valve gradient is 3.0 mmHg.   Tricuspid Valve: The tricuspid valve is normal in structure. Tricuspid  valve regurgitation is not demonstrated.   Aortic Valve: The aortic valve is tricuspid. Aortic valve regurgitation is  not visualized. No aortic stenosis is present.   Pulmonic Valve: The pulmonic valve was normal in structure. Pulmonic valve  regurgitation is trivial.   Aorta: The aortic root is normal in size and structure.   Venous: The inferior vena cava is normal in size with greater than 50%  respiratory variability, suggesting right atrial pressure of 3 mmHg.   IAS/Shunts: No atrial level shunt detected by color flow Doppler.   Patient Profile     52 y.o. male with history of hypertension, HFpEF (LVEF from 30% in 2019 to 55% in 2023), ESRD DD,  HTN, intractable hiccups, DM2 presented to the ER with chest pain.   Assessment & Plan    Atypical chest pain Elevated troponin  Patient admitted with symptoms of chest pain x2 days. On exam, pain was reproducible with deep breaths and improved with positional changes. ECG without acute  ischemic changes. Troponin mildly elevated but flat: 119, 122. Patient is now chest pain free.  Patient's symptoms and workup are not consistent with ACS. Will plan to complete nuclear stress testing in outpatient setting. Cardiac CT with FFR less likely to be diagnostic, suspect coronary calcifications with ESRD. Cardiology follow up arranged, 2 weeks. Will attempt to complete outpatient stress testing prior to this visit.  Chronic HFpEF (recovered LVEF)  Euvolemic appearing. Continue Coreg 73m BID. GDMT limited by ESRD.    Per primary team:  ESRD DM type II      For questions or updates, please contact [place] CityPlease consult [email or web address] for contact info under        Signed, ELily Kocher PA-C  08/07/2022, 11:06 AM    I have seen and examined the patient along with ELily Kocher PA-C .  I have reviewed the chart, notes and new data.  I agree with PA/NP's note.  Key new complaints: Does not have chest pain; previous problems with pain appear to be positional. Key examination changes: Getting hemodialysis.  No overt evidence of hypervolemia. Key new findings / data: Very mild and adynamic elevation in troponin.  Echocardiogram pending.  Suspicion for acute coronary syndrome is low.  PLAN: Outpatient SPECT/PET scan is appropriate.  No plan for coronary angiography at this time.  Will schedule.  MSanda Klein MD, FD[place]([phone]/05/2023, 12:10 PM

## 2022-08-08 DIAGNOSIS — R0789 Other chest pain: Secondary | ICD-10-CM | POA: Diagnosis not present

## 2022-08-08 LAB — HEPATITIS B SURFACE ANTIBODY, QUANTITATIVE: Hep B S AB Quant (Post): 134.7 m[IU]/mL (ref >9.9)

## 2022-08-08 NOTE — Progress Notes (Signed)
08/08/2022  Corey Park DOB: [birth year] MRN: [id number]   RIDER WAIVER AND RELEASE OF LIABILITY  For the purposes of helping with transportation needs, [facility] partners with outside transportation providers (taxi companies, [place], Social research officer, government.) to give [employer] patients or other approved people the choice of on-demand rides [employer]") to our buildings for non-emergency visits.  By using [employer], I, the person signing this document, on behalf of myself and/or any legal minors (in my care using the [employer]), agree:  Government social research officer given to me are supplied by independent, outside transportation providers who do not work for, or have any affiliation with, [employer]. [place] is not a transportation company. [facility] has no control over the quality or safety of the rides I get using [employer]. [place] has no control over whether any outside ride will happen on time or not. [place] gives no guarantee on the reliability, quality, safety, or availability on any rides, or that no mistakes will happen. I know and accept that traveling by vehicle (car, truck, SVU, Lucianne Lei, bus, taxi, etc.) has risks of serious injuries such as disability, being paralyzed, and death. I know and agree the risk of using [employer] is mine alone, and not [employer]. Transport Services are provided "as is" and as are available. The transportation providers are in charge for all inspections and care of the vehicles used to provide these rides. I agree not to take legal action against [place], its agents, employees, officers, directors, representatives, insurers, attorneys, assigns, successors, subsidiaries, and affiliates at any time for any reasons related directly or indirectly to using [employer]. I also agree not to take legal action against  or its affiliates for any injury, death, or damage to property caused by or related to using  [employer]. I have read this Waiver and Release of Liability, and I understand the terms used in it and their legal meaning. This Waiver is freely and voluntarily given with the understanding that my right (or any legal minors) to legal action against [place] relating to [employer] is knowingly given up to use these services.   I attest that I read the Ride Waiver and Release of Liability to Corey Park, gave Mr. Kibby the opportunity to ask questions and answered the questions asked (if any). I affirm that [place] then provided consent for assistance with transportation.

## 2022-08-08 NOTE — Progress Notes (Signed)
OT Cancellation Note  Patient Details Name: Corey Park MRN: [id number] DOB: 05-Aug-1970   Cancelled Treatment:    Reason Eval/Treat Not Completed: Patient declined, no reason specified (pt declined getting OOB at this time, stating "it's too early". Will attempt follow up as schedule permits.)  Renaye Rakers, OTD, OTR/L SecureChat [facility] [phone]   Ulla Gallo 08/08/2022, 8:03 AM

## 2022-08-08 NOTE — Care Management Important Message (Signed)
Important Message  Patient Details  Name: Doyel Talbot MRN: [id number] Date of Birth: [birth year]   Medicare Important Message Given:  Yes     Shelda Altes 08/08/2022, 10:36 AM

## 2022-08-08 NOTE — Progress Notes (Signed)
D/C order noted. Contacted [facility] to advise clinic of pt's d/c today and pt should resume care tomorrow.   Melven Sartorius Renal Navigator [phone]

## 2022-08-08 NOTE — Discharge Summary (Addendum)
[facility] Discharge Summary  Patient name: Corey Park Medical record number: [id number] Date of birth: January 06, 1971 Age: 52 y.o. Gender: male Date of Admission: 08/04/2022  Date of Discharge: 08/08/2022 Admitting Physician: Lenoria Chime, MD  Primary Care Provider: Leslie Dales, DO Consultants: Cardiology, Nephrology  Indication for Hospitalization: Chest pain  Brief Hospital Course:  Graciano Hogland is a 52 y.o.male with a history of ESRD on dialysis, HFimpEF (55%, previously 25%), HTN, HLD, PAH, T2DM, blindness, h/o GIB who was admitted to the [facility] Medicine Teaching Service at [facility] for chest pain. His hospital course is detailed below:  Chest Pain  Patient presented with 1 day of crushing chest pain. Troponins peaked at 122. EKG unchanged from prior. Cardiology consulted and did not believe etiology to be ACS. Patient had reproducible chest pain with hiccups. PPI started on 2/10. Patient reported some hematemesis prior to admission; though, did not have any further episodes inpatient and had stable hgb. Started on aspirin 81 for suspected CAD. Lipid panel WNL, HDL of 38. Cardiology recommended outpatient stress test.  Cardiology has arranged follow-up in 2 weeks. Chest pain resolved and patient was stable and in good condition at time of discharge.  HFpEF BNP 579 which was less than patient has had in the past. Symptomatically and per exam, patient not in heart failure exacerbation. Volume managed through dialysis.   ESRD  Nephrology consulted and patient continued having MWF dialysis sessions. Rena-vit continued.   Other chronic conditions were medically managed with home medications and formulary alternatives as necessary (carvedilol, olmesartan)  PCP Follow-up Recommendations: F/u outpatient stress test  2. Recommend outpatient sleep study given hypoxia while sleeping in hospital 3. Call [phone]  to inquire about PCS application  status  Discharge Diagnoses/Problem List:  * Non-cardiac chest pain ESRD on hemodialysis ([place]) (HFpEF) heart failure with preserved ejection fraction (HCC) HTN (hypertension) Need for assistance at home without other household member able to render care  Disposition: Home  Discharge Condition: Stable  Discharge Exam:  General: NAD, pleasant Cardio: RRR, no murmurs Resp: CTAB, normal wob on RA GI: Soft, not tender, not distended. BS+  Significant Procedures: none  Significant Labs and Imaging:  Recent Labs  Lab 08/07/22 0634  WBC 7.0  HGB 11.2*  HCT 34.3*  PLT 197   Recent Labs  Lab 08/07/22 0634  NA 136  K 4.2  CL 95*  CO2 25  GLUCOSE 171*  BUN 39*  CREATININE 12.45*  CALCIUM 8.3*  PHOS 6.8*  ALBUMIN 2.9*   Results/Tests Pending at Time of Discharge: None  Discharge Medications:  Allergies as of 08/08/2022   No Known Allergies      Medication List     TAKE these medications    Aspirin Low Dose 81 MG tablet Generic drug: aspirin EC Take 1 tablet (81 mg total) by mouth daily. Swallow whole.   calcium acetate 667 MG tablet Commonly known as: PHOSLO Take 1,334 mg by mouth 3 (three) times daily.   Coreg 25 MG tablet Generic drug: carvedilol Take 25 mg by mouth 2 (two) times daily.   diclofenac Sodium 1 % Gel Commonly known as: VOLTAREN Apply 2 g topically 3 (three) times daily.   gabapentin 100 MG capsule Commonly known as: NEURONTIN Take 100 mg by mouth 3 (three) times daily.   multivitamin Tabs tablet Take 1 tablet by mouth daily.   olmesartan 20 MG tablet Commonly known as: BENICAR Take 1 tablet (20 mg total) by mouth daily.  pantoprazole 40 MG tablet Commonly known as: PROTONIX Take 1 tablet (40 mg total) by mouth daily.        Discharge Instructions: Please refer to Patient Instructions section of EMR for full details.  Patient was counseled important signs and symptoms that should prompt return to medical care, changes in  medications, dietary instructions, activity restrictions, and follow up appointments.   Follow-Up Appointments:  Follow-up Information     Leslie Dales, DO Follow up on 08/10/2022.   Specialty: Family Medicine Why: 2:10 Contact information: [place] [address] [phone]         [facility] [employer]. Schedule an appointment as soon as possible for a visit.   Specialty: Cardiology Why: They will call you about scheduling a stress test Contact information: [address], [address] [place] [phone]        Richardson Dopp T, PA-C Follow up.   Specialties: Cardiology, Physician Assistant Why: 10:55 AM (Arrive by 10:40 AM) Contact information: [address] [place] [address] [phone]                 Leslie Dales, DO 08/08/2022, 9:51 AM PGY-1, [facility]  Upper Level Addendum:  I have seen and evaluated this patient along with Dr. Nelda Bucks and reviewed the above note, making necessary revisions as appropriate.  I agree with the medical decision making and physical exam as noted above.  Gerrit Heck, MD PGY-2 [facility] Family Medicine Residency

## 2022-08-08 NOTE — Progress Notes (Signed)
Met with pt and completed rider waiver form. Pt clarified the address he is going to at DC is [place] [address]. CSW provided taxi voucher to Financial controller.

## 2022-08-08 NOTE — Progress Notes (Signed)
Patient ID: [name], male   DOB: [birth year], 52 y.o.   MRN: [id number]  Plans noted to DC home today, call if this changes.  Elmarie Shiley MD [facility]. Office # [phone] Pager # [phone] 9:39 AM

## 2022-08-10 ENCOUNTER — Encounter: Payer: Self-pay | Admitting: Student

## 2022-08-10 ENCOUNTER — Ambulatory Visit (INDEPENDENT_AMBULATORY_CARE_PROVIDER_SITE_OTHER): Payer: Medicare Other | Admitting: Student

## 2022-08-10 VITALS — BP 155/82 | HR 74 | Ht 73.0 in | Wt 225.0 lb

## 2022-08-10 DIAGNOSIS — R29818 Other symptoms and signs involving the nervous system: Secondary | ICD-10-CM | POA: Diagnosis present

## 2022-08-10 DIAGNOSIS — R079 Chest pain, unspecified: Secondary | ICD-10-CM | POA: Diagnosis not present

## 2022-08-10 MED ORDER — ASPIRIN 81 MG PO TBEC: 81.0000 mg | DELAYED_RELEASE_TABLET | Freq: Every day | ORAL | 0 refills | Status: DC

## 2022-08-10 MED ORDER — GABAPENTIN 100 MG PO CAPS: 100.0000 mg | ORAL_CAPSULE | Freq: Three times a day (TID) | ORAL | 1 refills | Status: DC

## 2022-08-10 MED ORDER — OLMESARTAN MEDOXOMIL 20 MG PO TABS: 20.0000 mg | ORAL_TABLET | Freq: Every day | ORAL | 0 refills | Status: DC

## 2022-08-10 MED ORDER — COREG 25 MG PO TABS: 25.0000 mg | ORAL_TABLET | Freq: Two times a day (BID) | ORAL | 1 refills | Status: DC

## 2022-08-10 MED ORDER — PANTOPRAZOLE SODIUM 40 MG PO TBEC: 40.0000 mg | DELAYED_RELEASE_TABLET | Freq: Every day | ORAL | 0 refills | Status: DC

## 2022-08-10 NOTE — TOC Transition Note (Signed)
Transition of care contact from inpatient facility  Date of discharge: 08/08/22 Date of contact: 08/10/22 Method: Phone Spoke to: Patient  Patient contacted to discuss transition of care from recent inpatient hospitalization. Patient was admitted to [facility] from 08/04/22-08/08/22 with discharge diagnosis of chest pain.  Medication changes were reviewed.  Patient reports not going to HD yesterday. Strongly advised him to make his scheduled HD tomorrow at [facility].  Tobie Poet, NP

## 2022-08-10 NOTE — Telephone Encounter (Signed)
Order was placed by Lily Kocher 08/07/22.

## 2022-08-10 NOTE — Patient Instructions (Signed)
It was great to see you! Thank you for allowing me to participate in your care!   I recommend that you always bring your medications to each appointment as this makes it easy to ensure we are on the correct medications and helps [place] not miss when refills are needed.  Our plans for today:  - I have ordered a sleep study, they will call you for appointment - Please go to your cardiology appointments - Follow-up in 3-4 weeks to talk about blood pressure   Take care and seek immediate care sooner if you develop any concerns. Please remember to show up 15 minutes before your scheduled appointment time!  Leslie Dales, DO [facility]

## 2022-08-10 NOTE — Progress Notes (Addendum)
    SUBJECTIVE:   CHIEF COMPLAINT / HPI:   Hospital follow-up Patient recently seen in hospital for chest pain. Currently resolved. Believed to be MSK. Outpatient stress test on 08/17/22. Has follow-up with cardiology.  OSA screening Inpatient team recommended screening for OSA:  Do you snore loudly: Yes Do you often feel fatigued or tired or sleepy during the day time: Yes Has anyone observed you stop breathing at night: Yes Do you have high blood pressure: Yes BMI (>35 +1): No AGE (>50 +1): Yes Gender (Male +1): Yes  PERTINENT  PMH / PSH: ESRD on HD, HTN, T2DM, HLD  OBJECTIVE:   BP (!) 155/82   Pulse 74   Ht '6\' 1"'$  (1.854 m)   Wt 225 lb (102.1 kg)   SpO2 100%   BMI 29.69 kg/m    General: NAD, pleasant, HEENT: Normocephalic, atraumatic head. Normal external ear. Normal conjunctiva BL. Normal external nose.  Cardio: RRR, no MRG. Cap Refill <2s. Respiratory: CTAB, normal wob on R Skin: Warm and dry  ASSESSMENT/PLAN:   Chest pain, unspecified type Resolved at this time. Stress test on 08/17/22. Cardiology follow-up on 08/21/22. Started on ASA. -Continue ASA -Follow-up with cards  Suspected sleep apnea High risk on STOP-BANG -Sleep study ordered   HTN Recently restarted on Olmesartan '20mg'$  by dialysis center, but was not taking- restarted while in hospital. Typically BP lowers after dialysis. Will give more time for olemsartan to take affect. Also taking coreg 25 mg. -Follow up in 3-4 weeks to discuss BP  GERD Started on protonix in hospital. -Continue protonix   Follow-up recommendations 1.) Schedule patient for medicare annual wellness 2.) Discuss vaccinations  Leslie Dales, [place]

## 2022-08-11 ENCOUNTER — Telehealth (HOSPITAL_COMMUNITY): Payer: Self-pay | Admitting: *Deleted

## 2022-08-11 NOTE — Telephone Encounter (Signed)
Pt reached and given instructions for MPI study scheduled on 08/17/22.

## 2022-08-16 NOTE — Progress Notes (Deleted)
Cardiology Clinic Note   Patient Name: Corey Park Date of Encounter: 08/16/2022  Primary Care Provider:  Leslie Dales, DO Primary Cardiologist:  Candee Furbish, MD  Patient Profile    Corey Park is a 52 y.o. male with a past medical history of chronic systolic heart failure, hypertensive cardiomyopathy, ESRD on HD, T2DM, hypertension, Covid 19 hospitalization February 2021, blindness, intractable hiccups  who presents to the clinic today for hospital follow-up  Past Medical History    Past Medical History:  Diagnosis Date   Acute CHF (congestive heart failure) ([place]) 07/27/2019   AKI (acute kidney injury) ([place]) 12/11/2016   Allergy, unspecified, initial encounter 08/25/2019   Anemia 2021   Anesthesia of skin 03/31/2020   Asthma    as a child   Blind    Cellulitis, perineum 11/26/2019   CHF (congestive heart failure) ([place]) 2021   Chronic kidney disease, stage V ([place]) 06/17/2018   Chronic kidney disease, stage V ([place]) 123456   Complication of vascular dialysis catheter 08/25/2019   COVID-19 virus infection 07/27/2019   Diabetes mellitus without complication ([place])    type 2   Dilated cardiomyopathy ([place]) 07/03/2018   Elevated troponin    Encounter for removal of sutures 08/04/2020   Fe deficiency anemia 12/23/2018   Fluid overload 12/11/2016   Hypertension    Hypertensive urgency 12/11/2016   Hypomagnesemia 12/13/2016   Legally blind    B/L   Pain, unspecified 10/17/2019   Pneumonia 2021   Renal disorder    dialysis T-TH-Sat   Shortness of breath 11/10/2019   Symptomatic anemia 12/21/2018   Type 2 diabetes mellitus with diabetic peripheral angiopathy without gangrene ([place]) 08/25/2019   Unspecified protein-calorie malnutrition ([place]) 08/25/2019   Past Surgical History:  Procedure Laterality Date   BASCILIC VEIN TRANSPOSITION Right 10/16/2019   Procedure: Basilic Vein Transposition Right Arm;  Surgeon: Serafina Mitchell, MD;  Location: [place];  Service: Vascular;  Laterality:  Right;   [place] Right 01/29/2020   Procedure: RIGHT ARM SECOND STAGE [place];  Surgeon: Serafina Mitchell, MD;  Location: [place];  Service: Vascular;  Laterality: Right;   BIOPSY  12/22/2018   Procedure: BIOPSY;  Surgeon: Laurence Spates, MD;  Location: [place];  Service: Endoscopy;;   ESOPHAGOGASTRODUODENOSCOPY N/A 12/22/2018   Procedure: ESOPHAGOGASTRODUODENOSCOPY (EGD);  Surgeon: Laurence Spates, MD;  Location[facility];  Service: Endoscopy;  Laterality: N/A;   IR FLUORO GUIDE CV LINE RIGHT  07/29/2019   IR US GUIDE VASC ACCESS RIGHT  07/29/2019    Allergies  No Known Allergies  History of Present Illness    Corey Park has a past medical history of: Chronic systolic heart failure. Echo 04/17/2022: EF 55%. WMA with basal inferior hypokinesis. Mild concentric LVH. Grade II DD. Trivial MR.  Hypertensive cardiomyopathy.  Hypertension. Hyperlipidemia.  Lipid panel 08/06/2022: LDL 94, HDL 38, TG 75, total 147.   ESRD on HD.  T2DM.  Covid 19 hospitalization 2021.  Blindness.  Intractable hiccups.   Patient was first evaluated by Dr. Marlou Porch on 12/09/2016 for hypertensive emergency during hospital admission. Echo showed normal LV systolic function without WMA. Patient was not seen in follow-up after his hospital admission. He was re-evaluated for shortness of breath and lower extremity edema on 06/17/2018 during hospital admission. At that time echo showed EF 25-30%, moderate LVH, elevated LVEDP and left atrial filling pressure, moderate LAE, and peak PA pressure 36 mmHg. Patient was diuresed and medications were adjusted prior to discharge on  06/19/2018. He could not be put on an ACE/ARB or spironolactone secondary to kidney function. He followed up as an outpatient on 07/05/2018 and was doing well. He was lost in follow-up until hospitalization for a four day history of  lower extremity edema, fatigue and weakness and found to be Covid positive.  Patient was started on hemodialysis during hospitalization for volume management. Patient followed-up with a virtual visit with Dr. Marlou Porch after discharge. His BP could not be evaluated but he was encouraged to continue carvedilol and hydralazine. He did not follow-up again until he saw Laurann Montana, NP on 12/10/2020. He was started on Imdur at this visit. Repeat echo showed EF 40-45%, moderate LVH. He was referred to GI for his hiccups (which were thought to be secondary to uremia). On follow-up with Dr. Marlou Porch on 04/11/2021 patient reported not taking Imdur and skipping doses of carvedilol secondary to headaches. He was changed to Toprol XL. Patient had two ED visits (September and October 2023) for atypical chest pain and shortness of breath. In both instances he had skipped a dialysis treatment. Cardiology was not consulted for either visit.   Most recently, patient presented to the ED on 08/04/2022 for shortness of breath and chest pain after missing dialysis. Troponin x 2 elevated and flat. EKG showed sinus tachycardia with no ST-T wave changes. Pain occurs at rest, worsened with hiccups and improved with position changes. Dr. Dellia Cloud evaluated patient and felt chest pain was noncardiac in nature. Patient was scheduled for outpatient stress testing and follow-up. Patient discharged on 08/08/2022 with resolution of chest pain.   Today, patient ***  Stress test 2/22 *** Olmesartan? Atorvastatin?   Chest pain. Patient evaluated several times in the ED for atypical chest pain. Most recently visit on 08/04/2022 was felt to be noncardiac in nature. Nuclear stress test *** Patient *** Continue aspirin and carvedilol.  Chronic systolic heart failure/hypertensive cardiomyopathy. Echo October 2023 showed recovered EF of 55%, grade II DD, WMA with basal inferior hypokinesis and mild LVH. Patient *** Volume managed through hemodialysis.  Hypertension. BP today *** Patient denies headaches or dizziness. Continue  olmesartan and carvedilol. *** Hyperlipidemia. LDL February 2024 94, not at goal. Given patient's history of T2DM LDL should be <70. *** ESRD. On HD MWF. Patient has not missed ***  Home Medications    No outpatient medications have been marked as taking for the 08/21/22 encounter (Appointment) with Richardson Dopp T, PA-C.    Family History    Family History  Problem Relation Age of Onset   CAD Mother    Hypertension Mother    Diabetes Neg Hx    Stroke Neg Hx    Cancer Neg Hx    Kidney failure Neg Hx    Stomach cancer Neg Hx    Colon cancer Neg Hx    Rectal cancer Neg Hx    He indicated that his mother is alive. He indicated that his father is deceased. He indicated that his maternal grandmother is deceased. He indicated that his maternal grandfather is deceased. He indicated that his paternal grandmother is deceased. He indicated that his paternal grandfather is deceased. He indicated that the status of his neg hx is unknown.   Social History    Social History   Socioeconomic History   Marital status: Single    Spouse name: Not on file   Number of children: Not on file   Years of education: Not on file   Highest education level: Not on file  Occupational History   Not on file  Tobacco Use   Smoking status: Never   Smokeless tobacco: Never  Vaping Use   Vaping Use: Never used  Substance and Sexual Activity   Alcohol use: Not Currently   Drug use: Not Currently    Types: Marijuana   Sexual activity: Not on file  Other Topics Concern   Not on file  Social History Narrative   Not on file   Social Determinants of Health   Financial Resource Strain: Not on file  Food Insecurity: No Food Insecurity (08/06/2022)   Hunger Vital Sign    Worried About Running Out of Food in the Last Year: Never true    Ran Out of Food in the Last Year: Never true  Transportation Needs: No Transportation Needs (08/06/2022)   PRAPARE - Hydrologist (Medical):  No    Lack of Transportation (Non-Medical): No  Physical Activity: Not on file  Stress: Not on file  Social Connections: Not on file  Intimate Partner Violence: Not At Risk (08/06/2022)   Humiliation, Afraid, Rape, and Kick questionnaire    Fear of Current or Ex-Partner: No    Emotionally Abused: No    Physically Abused: No    Sexually Abused: No     Review of Systems    General: *** No chills, fever, night sweats or weight changes.  Cardiovascular:  No chest pain, dyspnea on exertion, edema, orthopnea, palpitations, paroxysmal nocturnal dyspnea. Dermatological: No rash, lesions/masses Respiratory: No cough, dyspnea Urologic: No hematuria, dysuria Abdominal:   No nausea, vomiting, diarrhea, bright red blood per rectum, melena, or hematemesis Neurologic:  No visual changes, weakness, changes in mental status. All other systems reviewed and are otherwise negative except as noted above.  Physical Exam    VS:  There were no vitals taken for this visit. , BMI There is no height or weight on file to calculate BMI. GEN: *** Well nourished, well developed, in no acute distress. HEENT: Normal. Neck: Supple, no JVD, carotid bruits, or masses. Cardiac: RRR, no murmurs, rubs, or gallops. No clubbing, cyanosis, edema.  Radials/DP/PT 2+ and equal bilaterally.  Respiratory:  Respirations regular and unlabored, clear to auscultation bilaterally. GI: Soft, nontender, nondistended. MS: No deformity or atrophy. Skin: Warm and dry, no rash. Neuro: Strength and sensation are intact. Psych: Normal affect.  Accessory Clinical Findings    The following studies were reviewed for this visit: ***  Recent Labs: 04/16/2022: ALT 13 08/05/2022: B Natriuretic Peptide 579.2; Magnesium 1.9 08/07/2022: BUN 39; Creatinine, Ser 12.45; Hemoglobin 11.2; Platelets 197; Potassium 4.2; Sodium 136   Recent Lipid Panel    Component Value Date/Time   CHOL 147 08/06/2022 0706   CHOL 167 01/16/2019 1517   TRIG 75  08/06/2022 0706   HDL 38 (L) 08/06/2022 0706   HDL 41 01/16/2019 1517   CHOLHDL 3.9 08/06/2022 0706   VLDL 15 08/06/2022 0706   LDLCALC 94 08/06/2022 0706   LDLCALC 109 (H) 01/16/2019 1517    No BP recorded.  {Refresh Note OR Click here to enter BP  :1}***    ECG personally reviewed by me today: ***  No significant changes from ***  {Does this patient have ATRIAL FIBRILLATION?[phone]}   Assessment & Plan   ***      Disposition: ***   Justice Britain. Ziair Penson, DNP, NP-C     08/16/2022, 8:13 AM [place] [address] Office [phone] Fax [phone]

## 2022-08-17 ENCOUNTER — Encounter: Payer: Self-pay | Admitting: *Deleted

## 2022-08-17 ENCOUNTER — Ambulatory Visit (HOSPITAL_COMMUNITY): Payer: Medicare Other | Attending: Cardiology

## 2022-08-17 DIAGNOSIS — R079 Chest pain, unspecified: Secondary | ICD-10-CM | POA: Diagnosis present

## 2022-08-17 LAB — MYOCARDIAL PERFUSION IMAGING
Estimated workload: 1
Exercise duration (min): 1 min
Exercise duration (sec): 0 s
LV dias vol: 230 mL (ref 62–150)
LV sys vol: 134 mL
MPHR: 169 {beats}/min
Nuc Stress EF: 42 %
Peak HR: 76 {beats}/min
Percent HR: 44 %
Rest HR: 65 {beats}/min
Rest Nuclear Isotope Dose: 10.8 mCi
SDS: 2
SRS: 1
SSS: 3
ST Depression (mm): 0 mm
Stress Nuclear Isotope Dose: 31.7 mCi
TID: 1.05

## 2022-08-17 MED ORDER — TECHNETIUM TC 99M TETROFOSMIN IV KIT
10.8000 | PACK | Freq: Once | INTRAVENOUS | Status: AC | PRN
  Administered 2022-08-17: 10.8 via INTRAVENOUS

## 2022-08-17 MED ORDER — TECHNETIUM TC 99M TETROFOSMIN IV KIT
31.7000 | PACK | Freq: Once | INTRAVENOUS | Status: AC | PRN
  Administered 2022-08-17: 31.7 via INTRAVENOUS

## 2022-08-17 MED ORDER — REGADENOSON 0.4 MG/5ML IV SOLN
0.4000 mg | Freq: Once | INTRAVENOUS | Status: AC
  Administered 2022-08-17: 0.4 mg via INTRAVENOUS

## 2022-08-21 ENCOUNTER — Ambulatory Visit: Payer: Medicare Other | Attending: Physician Assistant | Admitting: Physician Assistant

## 2022-08-22 ENCOUNTER — Encounter: Payer: Self-pay | Admitting: *Deleted

## 2022-08-31 ENCOUNTER — Institutional Professional Consult (permissible substitution): Payer: Medicare Other | Admitting: Primary Care

## 2022-09-12 ENCOUNTER — Other Ambulatory Visit: Payer: Self-pay

## 2022-09-12 ENCOUNTER — Emergency Department (HOSPITAL_COMMUNITY): Payer: Medicare Other

## 2022-09-12 ENCOUNTER — Encounter (HOSPITAL_COMMUNITY): Payer: Self-pay

## 2022-09-12 ENCOUNTER — Observation Stay (HOSPITAL_COMMUNITY)
Admission: EM | Admit: 2022-09-12 | Discharge: 2022-09-13 | Disposition: A | Discharge status: Home / Self Care | Payer: Medicare Other | Attending: Family Medicine | Admitting: Family Medicine

## 2022-09-12 DIAGNOSIS — N186 End stage renal disease: Secondary | ICD-10-CM

## 2022-09-12 DIAGNOSIS — R079 Chest pain, unspecified: Secondary | ICD-10-CM | POA: Diagnosis not present

## 2022-09-12 DIAGNOSIS — J45909 Unspecified asthma, uncomplicated: Secondary | ICD-10-CM | POA: Diagnosis not present

## 2022-09-12 DIAGNOSIS — I509 Heart failure, unspecified: Secondary | ICD-10-CM | POA: Insufficient documentation

## 2022-09-12 DIAGNOSIS — Z7982 Long term (current) use of aspirin: Secondary | ICD-10-CM | POA: Diagnosis not present

## 2022-09-12 DIAGNOSIS — Z79899 Other long term (current) drug therapy: Secondary | ICD-10-CM | POA: Insufficient documentation

## 2022-09-12 DIAGNOSIS — E1122 Type 2 diabetes mellitus with diabetic chronic kidney disease: Secondary | ICD-10-CM | POA: Insufficient documentation

## 2022-09-12 DIAGNOSIS — R0602 Shortness of breath: Secondary | ICD-10-CM | POA: Diagnosis not present

## 2022-09-12 DIAGNOSIS — Z992 Dependence on renal dialysis: Secondary | ICD-10-CM | POA: Diagnosis not present

## 2022-09-12 DIAGNOSIS — E119 Type 2 diabetes mellitus without complications: Secondary | ICD-10-CM

## 2022-09-12 DIAGNOSIS — I132 Hypertensive heart and chronic kidney disease with heart failure and with stage 5 chronic kidney disease, or end stage renal disease: Secondary | ICD-10-CM | POA: Insufficient documentation

## 2022-09-12 DIAGNOSIS — I5042 Chronic combined systolic (congestive) and diastolic (congestive) heart failure: Secondary | ICD-10-CM

## 2022-09-12 DIAGNOSIS — R072 Precordial pain: Secondary | ICD-10-CM

## 2022-09-12 DIAGNOSIS — E1129 Type 2 diabetes mellitus with other diabetic kidney complication: Secondary | ICD-10-CM | POA: Diagnosis not present

## 2022-09-12 DIAGNOSIS — I110 Hypertensive heart disease with heart failure: Secondary | ICD-10-CM | POA: Diagnosis present

## 2022-09-12 DIAGNOSIS — Z1152 Encounter for screening for COVID-19: Secondary | ICD-10-CM | POA: Diagnosis not present

## 2022-09-12 DIAGNOSIS — R809 Proteinuria, unspecified: Secondary | ICD-10-CM

## 2022-09-12 DIAGNOSIS — Z8616 Personal history of COVID-19: Secondary | ICD-10-CM | POA: Diagnosis not present

## 2022-09-12 LAB — GLUCOSE, CAPILLARY
Glucose-Capillary: 159 mg/dL — ABNORMAL HIGH (ref 70–99)
Glucose-Capillary: 161 mg/dL — ABNORMAL HIGH (ref 70–99)

## 2022-09-12 LAB — BASIC METABOLIC PANEL
Anion gap: 19 — ABNORMAL HIGH (ref 5–15)
BUN: 40 mg/dL — ABNORMAL HIGH (ref 6–20)
CO2: 25 mmol/L (ref 22–32)
Calcium: 8.5 mg/dL — ABNORMAL LOW (ref 8.9–10.3)
Chloride: 92 mmol/L — ABNORMAL LOW (ref 98–111)
Creatinine, Ser: 10.51 mg/dL — ABNORMAL HIGH (ref 0.61–1.24)
GFR, Estimated: 5 mL/min — ABNORMAL LOW (ref >60)
Glucose, Bld: 88 mg/dL (ref 70–99)
Potassium: 4.5 mmol/L (ref 3.5–5.1)
Sodium: 136 mmol/L (ref 135–145)

## 2022-09-12 LAB — TROPONIN I (HIGH SENSITIVITY)
Troponin I (High Sensitivity): 110 ng/L (ref <18)
Troponin I (High Sensitivity): 117 ng/L (ref <18)
Troponin I (High Sensitivity): 130 ng/L (ref <18)

## 2022-09-12 LAB — CBC
HCT: 40.7 % (ref 39.0–52.0)
Hemoglobin: 13.6 g/dL (ref 13.0–17.0)
MCH: 31.3 pg (ref 26.0–34.0)
MCHC: 33.4 g/dL (ref 30.0–36.0)
MCV: 93.6 fL (ref 80.0–100.0)
Platelets: 188 10*3/uL (ref 150–400)
RBC: 4.35 MIL/uL (ref 4.22–5.81)
RDW: 16.9 % — ABNORMAL HIGH (ref 11.5–15.5)
WBC: 6.2 10*3/uL (ref 4.0–10.5)
nRBC: 0 % (ref 0.0–0.2)

## 2022-09-12 LAB — PHOSPHORUS: Phosphorus: 7.7 mg/dL — ABNORMAL HIGH (ref 2.5–4.6)

## 2022-09-12 LAB — LIPID PANEL
Cholesterol: 113 mg/dL (ref 0–200)
HDL: 35 mg/dL — ABNORMAL LOW (ref >40)
LDL Cholesterol: 69 mg/dL (ref 0–99)
Total CHOL/HDL Ratio: 3.2 RATIO
Triglycerides: 46 mg/dL (ref <150)
VLDL: 9 mg/dL (ref 0–40)

## 2022-09-12 LAB — RESP PANEL BY RT-PCR (RSV, FLU A&B, COVID)  RVPGX2
Influenza A by PCR: NEGATIVE
Influenza B by PCR: NEGATIVE
Resp Syncytial Virus by PCR: NEGATIVE
SARS Coronavirus 2 by RT PCR: NEGATIVE

## 2022-09-12 LAB — HEPATITIS B SURFACE ANTIGEN: Hepatitis B Surface Ag: NONREACTIVE

## 2022-09-12 MED ORDER — GABAPENTIN 100 MG PO CAPS
100.0000 mg | ORAL_CAPSULE | Freq: Three times a day (TID) | ORAL | Status: DC
  Administered 2022-09-12 – 2022-09-13 (×4): 100 mg via ORAL
  Filled 2022-09-12 (×4): qty 1

## 2022-09-12 MED ORDER — CALCITRIOL 0.5 MCG PO CAPS
1.2500 ug | ORAL_CAPSULE | ORAL | Status: DC
  Administered 2022-09-13: 1.25 ug via ORAL
  Filled 2022-09-12: qty 1

## 2022-09-12 MED ORDER — CARVEDILOL 25 MG PO TABS
25.0000 mg | ORAL_TABLET | Freq: Two times a day (BID) | ORAL | Status: DC
  Administered 2022-09-12 – 2022-09-13 (×3): 25 mg via ORAL
  Filled 2022-09-12: qty 1
  Filled 2022-09-12: qty 2
  Filled 2022-09-12: qty 1

## 2022-09-12 MED ORDER — INSULIN ASPART 100 UNIT/ML IJ SOLN
0.0000 [IU] | Freq: Three times a day (TID) | INTRAMUSCULAR | Status: DC
  Administered 2022-09-12: 1 [IU] via SUBCUTANEOUS

## 2022-09-12 MED ORDER — ASPIRIN 81 MG PO CHEW
324.0000 mg | CHEWABLE_TABLET | Freq: Once | ORAL | Status: DC
  Filled 2022-09-12: qty 4

## 2022-09-12 MED ORDER — CHLORHEXIDINE GLUCONATE CLOTH 2 % EX PADS
6.0000 | MEDICATED_PAD | Freq: Every day | CUTANEOUS | Status: DC
  Administered 2022-09-13: 6 via TOPICAL

## 2022-09-12 MED ORDER — PANTOPRAZOLE SODIUM 40 MG PO TBEC
40.0000 mg | DELAYED_RELEASE_TABLET | Freq: Every day | ORAL | Status: DC
  Administered 2022-09-12 – 2022-09-13 (×2): 40 mg via ORAL
  Filled 2022-09-12 (×2): qty 1

## 2022-09-12 MED ORDER — FENTANYL CITRATE PF 50 MCG/ML IJ SOSY: 25.0000 ug | PREFILLED_SYRINGE | INTRAMUSCULAR | Status: DC | PRN

## 2022-09-12 MED ORDER — ROSUVASTATIN CALCIUM 20 MG PO TABS
20.0000 mg | ORAL_TABLET | Freq: Every day | ORAL | Status: DC
  Filled 2022-09-12: qty 1

## 2022-09-12 MED ORDER — DICLOFENAC SODIUM 1 % EX GEL
2.0000 g | Freq: Three times a day (TID) | CUTANEOUS | Status: DC
  Administered 2022-09-12: 2 g via TOPICAL
  Filled 2022-09-12 (×2): qty 100

## 2022-09-12 MED ORDER — RENA-VITE PO TABS
1.0000 | ORAL_TABLET | Freq: Every day | ORAL | Status: DC
  Administered 2022-09-12: 1 via ORAL
  Filled 2022-09-12 (×2): qty 1

## 2022-09-12 MED ORDER — ACETAMINOPHEN 325 MG PO TABS: 650.0000 mg | ORAL_TABLET | Freq: Four times a day (QID) | ORAL | Status: DC | PRN

## 2022-09-12 MED ORDER — ASPIRIN 81 MG PO TBEC
81.0000 mg | DELAYED_RELEASE_TABLET | Freq: Every day | ORAL | Status: DC
  Administered 2022-09-13: 81 mg via ORAL
  Filled 2022-09-12: qty 1

## 2022-09-12 MED ORDER — LIDOCAINE 5 % EX PTCH
1.0000 | MEDICATED_PATCH | CUTANEOUS | Status: DC
  Administered 2022-09-13: 1 via TRANSDERMAL
  Filled 2022-09-12: qty 1

## 2022-09-12 MED ORDER — CINACALCET HCL 30 MG PO TABS
30.0000 mg | ORAL_TABLET | ORAL | Status: DC
  Administered 2022-09-13: 30 mg via ORAL
  Filled 2022-09-12: qty 1

## 2022-09-12 MED ORDER — HEPARIN SODIUM (PORCINE) 5000 UNIT/ML IJ SOLN
5000.0000 [IU] | Freq: Three times a day (TID) | INTRAMUSCULAR | Status: DC
  Administered 2022-09-12 – 2022-09-13 (×5): 5000 [IU] via SUBCUTANEOUS
  Filled 2022-09-12 (×5): qty 1

## 2022-09-12 MED ORDER — CALCIUM ACETATE (PHOS BINDER) 667 MG PO CAPS
1334.0000 mg | ORAL_CAPSULE | Freq: Three times a day (TID) | ORAL | Status: DC
  Administered 2022-09-12 – 2022-09-13 (×3): 1334 mg via ORAL
  Filled 2022-09-12 (×4): qty 2

## 2022-09-12 MED ORDER — IRBESARTAN 300 MG PO TABS
150.0000 mg | ORAL_TABLET | Freq: Every day | ORAL | Status: DC
  Administered 2022-09-12 – 2022-09-13 (×2): 150 mg via ORAL
  Filled 2022-09-12 (×2): qty 1

## 2022-09-12 NOTE — Consult Note (Addendum)
Cardiology Consultation   Patient ID: Corey Park MRN: [id number]; DOB: 01-28-1971  Admit date: 09/12/2022 Date of Consult: 09/12/2022  PCP:  Corey Park, [facility] [place] [facility] Cardiologist:  Corey Furbish, MD   {    Patient Profile:   Corey Park is a 52 y.o. male with a hx of HTN, hypertensive cardiomyopathy, ESRD on HD MWF, type 2 DM, systolic and diastolic hear failure (with recovered EF from 20-25% to 55% ), medication non-compliance (remote),  who is being seen 09/12/2022 for the evaluation of chest pain at the request of Corey Park.  History of Present Illness:   Corey Park with above PMH presented to ER today with c/o chest pain. He woke up from sleep last night with a headache and chest pain. Pain is located in the [place] of his chest pain. He reports associated SOB. He was given ASA and nitro by EMS which did not help relieve the pain. He states he is hypertensive with SBP >260s at home. His anti-hypertensive had been stopped since Sunday, because he did not get refills and was not clear which pharmacy to go. He still feels he has chest pain 10/10 currently. He is irritated and has not been provided any meals. He states he is compliant with his dialysis MWF, had last dialysis yesterday. He denied any leg edema, orthopnea, PND, rapid weight gain.   Per chart review;  He follows Corey Park since 2018, initially found to have combined systolic and diastolic heart failure in the setting of uncontrolled HTN with medication non-compliance 2019. Echo from 06/17/18 showed LVEF 25-30%. LHC is not pursued due to advanced CKD at the time to avoid dialysis. He suffered COVID-19 2021, admitted for CHF exacerbation and CKD proegression. Echo  07/2019 showed LVEF 20-25%, moderate elevated PA systolic pressure. Hs trop 300s felt due to demand. He became hemodialysis dependent in 2021. He was placed on limited GDMT with Toprol, hydralazine, and imdru after that for CHF and she  stopped imdur later. Echo from 01/17/21 showed improved LVEF ro 40-45%. Last Echo repeat from 10/23 showed LVEF 55%, basal inferior hypokinesis, mild LVH, grade II DD, trivial MR. He was last admitted 08/05/22 for chest pain worsens with hiccups, Hs trop 100s, pain felt atypical. Subsequent NM stress myoview 08/17/22 was intermediate risk and showed LVEF 30-44%, There is a medium defect with severe reduction in uptake present in the apical to basal inferior location(s) that is fixed. There is abnormal wall motion in the defect area. Consistent with infarction. No ischemia.   Admission diagnostic today showed Cr 10.51, GFR 5. Hs trop 117 >130. Flu/COVID/RSV negative. EKG serials reviewed from today, sinus rhythm 70-80s, old inferolateral TWI, LVH. CXR showed Mild cardiomegaly with no acute radiographic chest findings.    Past Medical History:  Diagnosis Date   Acute CHF (congestive heart failure) ([place]) 07/27/2019   AKI (acute kidney injury) ([place]) 12/11/2016   Allergy, unspecified, initial encounter 08/25/2019   Anemia 2021   Anesthesia of skin 03/31/2020   Asthma    as a child   Blind    Cellulitis, perineum 11/26/2019   CHF (congestive heart failure) ([place]) 2021   Chronic kidney disease, stage V ([place]) 06/17/2018   Chronic kidney disease, stage V ([place]) 123456   Complication of vascular dialysis catheter 08/25/2019   COVID-19 virus infection 07/27/2019   Diabetes mellitus without complication ([place])    type 2   Dilated cardiomyopathy ([place]) 07/03/2018   Elevated troponin  Encounter for removal of sutures 08/04/2020   Fe deficiency anemia 12/23/2018   Fluid overload 12/11/2016   Hypertension    Hypertensive urgency 12/11/2016   Hypomagnesemia 12/13/2016   Legally blind    B/L   Pain, unspecified 10/17/2019   Pneumonia 2021   Renal disorder    dialysis T-TH-Sat   Shortness of breath 11/10/2019   Symptomatic anemia 12/21/2018   Type 2 diabetes mellitus with diabetic peripheral angiopathy without  gangrene ([place]) 08/25/2019   Unspecified protein-calorie malnutrition ([place]) 08/25/2019    Past Surgical History:  Procedure Laterality Date   BASCILIC VEIN TRANSPOSITION Right 10/16/2019   Procedure: Basilic Vein Transposition Right Arm;  Surgeon: Corey Mitchell, MD;  [facility]: [facility];  Service: Vascular;  Laterality: Right;   [place] Right 01/29/2020   Procedure: RIGHT ARM SECOND STAGE [place];  Surgeon: Corey Mitchell, MD;  Location: [place];  Service: Vascular;  Laterality: Right;   BIOPSY  12/22/2018   Procedure: BIOPSY;  Surgeon: Corey Spates, MD;  [facility];  Service: Endoscopy;;   ESOPHAGOGASTRODUODENOSCOPY N/A 12/22/2018   Procedure: ESOPHAGOGASTRODUODENOSCOPY (EGD);  Surgeon: Corey Spates, MD;  [facility];  Service: Endoscopy;  Laterality: N/A;   IR FLUORO GUIDE CV LINE RIGHT  07/29/2019   IR US GUIDE VASC ACCESS RIGHT  07/29/2019     Home Medications:  Prior to Admission medications   Medication Sig Start Date End Date Taking? Authorizing Provider  aspirin EC 81 MG tablet Take 1 tablet (81 mg total) by mouth daily. Swallow whole. 08/10/22  Yes Corey Park  calcium acetate (PHOSLO) 667 MG tablet Take 1,334 mg by mouth 3 (three) times daily. 09/12/19  Yes [provider]  COREG 25 MG tablet Take 1 tablet (25 mg total) by mouth 2 (two) times daily. 08/10/22  Yes Corey Park  diclofenac Sodium (VOLTAREN) 1 % GEL Apply 2 g topically 3 (three) times daily. 08/07/22  Yes Corey Park  gabapentin (NEURONTIN) 100 MG capsule Take 1 capsule (100 mg total) by mouth 3 (three) times daily. 08/10/22 10/09/22 Yes Corey Park  multivitamin (RENA-VIT) TABS tablet Take 1 tablet by mouth daily. 08/29/19  Yes [provider]  olmesartan (BENICAR) 20 MG tablet Take 1 tablet (20 mg total) by mouth daily. 08/10/22  Yes Corey Park  pantoprazole (PROTONIX) 40 MG tablet Take 1 tablet (40 mg total) by  mouth daily. 08/10/22  Yes Corey Park    Inpatient Medications: Scheduled Meds:  [START ON 09/13/2022] aspirin EC  81 mg Oral Daily   [START ON 09/13/2022] calcitRIOL  1.25 mcg Oral Q M,W,F-HD   calcium acetate  1,334 mg Oral TID with meals   carvedilol  25 mg Oral BID WC   [START ON 09/13/2022] Chlorhexidine Gluconate Cloth  6 each Topical Q0600   [START ON 09/13/2022] cinacalcet  30 mg Oral Q M,W,F-HD   diclofenac Sodium  2 g Topical TID   gabapentin  100 mg Oral TID   heparin  5,000 Units Subcutaneous Q8H   insulin aspart  0-6 Units Subcutaneous TID WC   irbesartan  150 mg Oral Daily   lidocaine  1 patch Transdermal Q24H   multivitamin  1 tablet Oral Daily   pantoprazole  40 mg Oral Daily   Continuous Infusions:  PRN Meds: acetaminophen, fentaNYL (SUBLIMAZE) injection  Allergies:   No Known Allergies  Social History:   Social History   Socioeconomic History   Marital status: Single  Spouse name: Not on file   Number of children: Not on file   Years of education: Not on file   Highest education level: Not on file  Occupational History   Not on file  Tobacco Use   Smoking status: Never   Smokeless tobacco: Never  Vaping Use   Vaping Use: Never used  Substance and Sexual Activity   Alcohol use: Not Currently   Drug use: Not Currently    Types: Marijuana   Sexual activity: Not on file  Other Topics Concern   Not on file  Social History Narrative   Not on file   Social Determinants of Health   Financial Resource Strain: Not on file  Food Insecurity: No Food Insecurity (08/06/2022)   Hunger Vital Sign    Worried About Running Out of Food in the Last Year: Never true    Ran Out of Food in the Last Year: Never true  Transportation Needs: No Transportation Needs (08/06/2022)   PRAPARE - Hydrologist (Medical): No    Lack of Transportation (Non-Medical): No  Physical Activity: Not on file  Stress: Not on file  Social  Connections: Not on file  Intimate Partner Violence: Not At Risk (08/06/2022)   Humiliation, Afraid, Rape, and Kick questionnaire    Fear of Current or Ex-Partner: No    Emotionally Abused: No    Physically Abused: No    Sexually Abused: No    Family History:    Family History  Problem Relation Age of Onset   CAD Mother    Hypertension Mother    Diabetes Neg Hx    Stroke Neg Hx    Cancer Neg Hx    Kidney failure Neg Hx    Stomach cancer Neg Hx    Colon cancer Neg Hx    Rectal cancer Neg Hx      ROS:  Constitutional: Denied fever, chills, malaise, night sweats Eyes: Denied vision change or loss Ears/Nose/Mouth/Throat: Denied ear ache, sore throat, coughing, sinus pain Cardiovascular: see HPI  Respiratory: see HPI  Gastrointestinal: Denied nausea, vomiting, abdominal pain, diarrhea Genital/Urinary: Denied dysuria, hematuria, urinary frequency/urgency Musculoskeletal: Denied muscle ache, joint pain, weakness Skin: Denied rash, wound Neuro: Denied headache, dizziness, syncope Psych: Denied history of depression/anxiety  Endocrine: history of diabetes   Physical Exam/Data:   Vitals:   09/12/22 1000 09/12/22 1030 09/12/22 1121 09/12/22 1130  BP: (!) 149/101 (!) 144/89  (!) 160/96  Pulse: 70 69  74  Resp: (!) 7 18  19   Temp:   98.1 F (36.7 C)   TempSrc:   Oral   SpO2: 97% 99%  99%  Weight:      Height:       No intake or output data in the 24 hours ending 09/12/22 1330    09/12/2022    3:02 AM 08/17/2022   10:03 AM 08/10/2022    1:50 PM  Last 3 Weights  Weight (lbs) 225 lb 225 lb 225 lb  Weight (kg) 102.059 kg 102.059 kg 102.059 kg     Body mass index is 29.69 kg/m.   Vitals:  Vitals:   09/12/22 1121 09/12/22 1130  BP:  (!) 160/96  Pulse:  74  Resp:  19  Temp: 98.1 F (36.7 C)   SpO2:  99%   General Appearance: In no apparent distress, laying in bed HEENT: Normocephalic, atraumatic.  Neck: Supple, trachea midline, no JVDs, no carotid  bruits. Cardiovascular: Regular rate and rhythm, normal  S1-S2,  no murmur/rub/gallop, S3/S4 Respiratory: Resting breathing unlabored, lungs sounds clear to auscultation bilaterally, no use of accessory muscles. On room air.  No wheezes, rales or rhonchi.   Gastrointestinal: Bowel sounds positive, abdomen soft, non-tender, non-distended. No mass or organomegaly.  Extremities: Able to move all extremities in bed without difficulty, no edema/cyanosis/clubbing, DP/PT pulses were 2+ and equal bilaterally Genitourinary: No CVA tenderness / genital exam not performed Musculoskeletal: Normal muscle bulk and tone, muscle strength 5/5 throughout, no limited range of motion, no swollen or erythematous joints Skin: Intact, warm, dry. No rashes or petechiae noted in exposed areas.  Neurologic: Alert, oriented to person, place and time. Fluent speech, no facial droop, no cognitive deficit, no pronator drift, no gross sensory deficit, no focal muscle weakness, no gross focal neuro deficit Psychiatric: Normal affect. Mood is appropriate.     EKG:  The EKG was personally reviewed and demonstrates:    EKG serials reviewed from today, sinus rhythm 70-80s, old inferolateral TWI, LVH  Telemetry:  Telemetry was personally reviewed and demonstrates:    Sinus rhythm   Relevant CV Studies:   NM stress myoview 08/17/22:     LV perfusion is abnormal. There is no evidence of ischemia. There is evidence of infarction. Defect 1: There is a medium defect with severe reduction in uptake present in the apical to basal inferior location(s) that is fixed. There is abnormal wall motion in the defect area. Consistent with infarction.   Left ventricular function is abnormal. Nuclear stress EF: 42 %. The left ventricular ejection fraction is moderately decreased (30-44%). End diastolic cavity size is moderately enlarged.   Findings are consistent with infarction. The study is intermediate risk.   Echo 04/17/22: 1. Left  ventricular ejection fraction, by estimation, is 55%. The left  ventricle has normal function. The left ventricle demonstrates regional  wall motion abnormalities with basal inferior hypokinesis. There is mild  concentric left ventricular  hypertrophy. Left ventricular diastolic parameters are consistent with  Grade II diastolic dysfunction (pseudonormalization).   2. Right ventricular systolic function is normal. The right ventricular  size is normal. Tricuspid regurgitation signal is inadequate for assessing  PA pressure.   3. The mitral valve is normal in structure. Trivial mitral valve  regurgitation. No evidence of mitral stenosis.   4. The aortic valve is tricuspid. Aortic valve regurgitation is not  visualized. No aortic stenosis is present.   5. The inferior vena cava is normal in size with greater than 50%  respiratory variability, suggesting right atrial pressure of 3 mmHg.    Laboratory Data:  High Sensitivity Troponin:   Recent Labs  Lab 09/12/22 0330 09/12/22 0510 09/12/22 0924  TROPONINIHS 110* 117* 130*     Chemistry Recent Labs  Lab 09/12/22 0330 09/12/22 0552  NA SPECIMEN HEMOLYZED. HEMOLYSIS MAY AFFECT INTEGRITY OF RESULTS. [place] HEMOLYSIS MAY AFFECT INTEGRITY OF RESULTS. 4.5  CL SPECIMEN HEMOLYZED. HEMOLYSIS MAY AFFECT INTEGRITY OF RESULTS. 22*  CO2 SPECIMEN HEMOLYZED. HEMOLYSIS MAY AFFECT INTEGRITY OF RESULTS. 25  GLUCOSE SPECIMEN HEMOLYZED. HEMOLYSIS MAY AFFECT INTEGRITY OF RESULTS. 88  BUN SPECIMEN HEMOLYZED. HEMOLYSIS MAY AFFECT INTEGRITY OF RESULTS. [place]. HEMOLYSIS MAY AFFECT INTEGRITY OF RESULTS. 10.51*  CALCIUM SPECIMEN HEMOLYZED. HEMOLYSIS MAY AFFECT INTEGRITY OF RESULTS. 8.5*  GFRNONAA SPECIMEN HEMOLYZED. HEMOLYSIS MAY AFFECT INTEGRITY OF RESULTS. 5*  GFRAA SPECIMEN HEMOLYZED. HEMOLYSIS MAY AFFECT INTEGRITY OF RESULTS.  --   ANIONGAP SPECIMEN HEMOLYZED. HEMOLYSIS MAY AFFECT INTEGRITY OF RESULTS.  19*  No results for input(s): "PROT", "ALBUMIN", "AST", "ALT", "ALKPHOS", "BILITOT" in the last 168 hours. Lipids No results for input(s): "CHOL", "TRIG", "HDL", "LABVLDL", "LDLCALC", "CHOLHDL" in the last 168 hours.  Hematology Recent Labs  Lab 09/12/22 0330  WBC 6.2  RBC 4.35  HGB 13.6  HCT 40.7  MCV 93.6  MCH 31.3  MCHC 33.4  RDW 16.9*  PLT 188   Thyroid No results for input(s): "TSH", "FREET4" in the last 168 hours.  BNPNo results for input(s): "BNP", "PROBNP" in the last 168 hours.  DDimer No results for input(s): "DDIMER" in the last 168 hours.   Radiology/Studies:  [facility] Chest Port 1 View  Result Date: 09/12/2022 CLINICAL DATA:  Chest pain. EXAM: PORTABLE CHEST 1 VIEW COMPARISON:  CTA chest 08/05/2022 FINDINGS: There is mild cardiomegaly with no evidence of CHF. The lungs are clear. No pleural effusion is seen. The mediastinum is normally outlined. There is calcification of the transverse aorta. Thoracic cage is intact. There have been no significant interval changes. IMPRESSION: 1. Mild cardiomegaly with no acute radiographic chest findings. 2. Aortic atherosclerosis. Electronically Signed   By: Telford Nab M.D.   On: 09/12/2022 04:52     Assessment and Plan:   Chest pain, atypical  - presented with mid-sternum chest pain waking him up from sleep and headache since 1:45am until now, in the setting of HTN urgency with SBP >260 at home  - Hs trop 110>117>130 - EKG no acute changes - last NM stress myoview 08/17/22 showed no ischemia and intermediate risk due to LVEF 30-44%  - given no evidence of ACS, HTN urgency, and medication noncompliance, recommend no ischemic evaluation, medical therapy with ASA 81mg  daily, coreg 25mg  BID, olmesartan 20mg  daily, will add crestor 20mg  daily and check lipid and A1C today  HTN urgency  - BP elevated up to 123456 systolic at home, in the setting of medication non-compliance (stopped all antihypertensive since Sunday)  - he was on coreg  25mg  BID and olmesartan 20mg  at home, both resumed - may add hydralazine if needed   Chronic systolic and diastolic heart failure  - LVEF was 55% on Echo 04/17/22 , was down to 25% in 2019 Echo  - NM stress 08/17/22 with LVEF 30-44%  - clinically euvolemic on exam - continue optimal control for BP - volume management per hemodialysis   ESRD Type 2 DM - per primary team     Risk Assessment/Risk Scores:    For questions or updates, please contact [place] Please consult [email or web address] for contact info under    Signed, Margie Billet, NP  09/12/2022 1:30 PM  I have examined the patient and reviewed assessment and plan and discussed with patient.  Agree with above as stated.    Needs to focus on BP control.  Would consider switching Coreg to Coreg CR as he has trouble getting two doses of carvedilol on dialysis days.  Mild troponin elevation.  Flat trend in the setting of ESRD.  Would not plan for cath at this time and focus on optimizing medical therapy for LV dysfunction.    Larae Grooms

## 2022-09-12 NOTE — Consult Note (Signed)
Renal Service Consult Note [facility]  [facility] 09/12/2022 Sol Blazing, MD Requesting Physician: Dr. Thompson Grayer, [name]   Reason for Consult: ESRD pt w/ chest pain HPI: The patient is a 52 y.o. year-old w/ PMH as below who presented to ED early this am w/ chest pain. Pt was awaken from sleep due to chest pain. Pt reportedly has stress test perfusion scan done in feb 2024 which results were intermediate for ischemia. In ED EKG was w/o acute changes. He rec'd 324mg  asa from EMS and 1 SL ntg w/o relief. Pt was admitted OBV status to FMTS. We are asked to see pt for dialysis.   Pt seen in ED. Vague historian, not feeling good.    ROS - denies CP, no joint pain, no HA, no blurry vision, no rash, no diarrhea, no nausea/ vomiting   Past Medical History  Past Medical History:  Diagnosis Date   Acute CHF (congestive heart failure) ([place]) 07/27/2019   AKI (acute kidney injury) ([place]) 12/11/2016   Allergy, unspecified, initial encounter 08/25/2019   Anemia 2021   Anesthesia of skin 03/31/2020   Asthma    as a child   Blind    Cellulitis, perineum 11/26/2019   CHF (congestive heart failure) ([place]) 2021   Chronic kidney disease, stage V ([place]) 06/17/2018   Chronic kidney disease, stage V ([place]) 123456   Complication of vascular dialysis catheter 08/25/2019   COVID-19 virus infection 07/27/2019   Diabetes mellitus without complication ([place])    type 2   Dilated cardiomyopathy ([place]) 07/03/2018   Elevated troponin    Encounter for removal of sutures 08/04/2020   Fe deficiency anemia 12/23/2018   Fluid overload 12/11/2016   Hypertension    Hypertensive urgency 12/11/2016   Hypomagnesemia 12/13/2016   Legally blind    B/L   Pain, unspecified 10/17/2019   Pneumonia 2021   Renal disorder    dialysis T-TH-Sat   Shortness of breath 11/10/2019   Symptomatic anemia 12/21/2018   Type 2 diabetes mellitus with diabetic peripheral angiopathy without gangrene ([place]) 08/25/2019   Unspecified  protein-calorie malnutrition ([place]) 08/25/2019   Past Surgical History  Past Surgical History:  Procedure Laterality Date   BASCILIC VEIN TRANSPOSITION Right 10/16/2019   Procedure: Basilic Vein Transposition Right Arm;  Surgeon: Serafina Mitchell, MD;  Location: [place];  Service: Vascular;  Laterality: Right;   [place] Right 01/29/2020   Procedure: RIGHT ARM SECOND STAGE [place];  Surgeon: Serafina Mitchell, MD;  Location: [place];  Service: Vascular;  Laterality: Right;   BIOPSY  12/22/2018   Procedure: BIOPSY;  Surgeon: Laurence Spates, MD;  Location: [place];  Service: Endoscopy;;   ESOPHAGOGASTRODUODENOSCOPY N/A 12/22/2018   Procedure: ESOPHAGOGASTRODUODENOSCOPY (EGD);  Surgeon: Laurence Spates, MD;  Location: [facility] ENDOSCOPY;  Service: Endoscopy;  Laterality: N/A;   IR FLUORO GUIDE CV LINE RIGHT  07/29/2019   IR US GUIDE VASC ACCESS RIGHT  07/29/2019   Family History  Family History  Problem Relation Age of Onset   CAD Mother    Hypertension Mother    Diabetes Neg Hx    Stroke Neg Hx    Cancer Neg Hx    Kidney failure Neg Hx    Stomach cancer Neg Hx    Colon cancer Neg Hx    Rectal cancer Neg Hx    Social History  reports that he has never smoked. He has never used smokeless tobacco. He reports that he does not currently use  alcohol. He reports that he does not currently use drugs after having used the following drugs: Marijuana. Allergies No Known Allergies Home medications Prior to Admission medications   Medication Sig Start Date End Date Taking? Authorizing Provider  aspirin EC 81 MG tablet Take 1 tablet (81 mg total) by mouth daily. Swallow whole. 08/10/22  Yes Leslie Dales, DO  calcium acetate (PHOSLO) 667 MG tablet Take 1,334 mg by mouth 3 (three) times daily. 09/12/19  Yes [provider]  COREG 25 MG tablet Take 1 tablet (25 mg total) by mouth 2 (two) times daily. 08/10/22  Yes Leslie Dales, DO  diclofenac Sodium (VOLTAREN) 1 %  GEL Apply 2 g topically 3 (three) times daily. 08/07/22  Yes Leslie Dales, DO  gabapentin (NEURONTIN) 100 MG capsule Take 1 capsule (100 mg total) by mouth 3 (three) times daily. 08/10/22 10/09/22 Yes Leslie Dales, DO  multivitamin (RENA-VIT) TABS tablet Take 1 tablet by mouth daily. 08/29/19  Yes [provider]  olmesartan (BENICAR) 20 MG tablet Take 1 tablet (20 mg total) by mouth daily. 08/10/22  Yes Leslie Dales, DO  pantoprazole (PROTONIX) 40 MG tablet Take 1 tablet (40 mg total) by mouth daily. 08/10/22  Yes Leslie Dales, DO     Vitals:   09/12/22 1000 09/12/22 1030 09/12/22 1121 09/12/22 1130  BP: (!) 149/101 (!) 144/89  (!) 160/96  Pulse: 70 69  74  Resp: (!) 7 18  19   Temp:   98.1 F (36.7 C)   TempSrc:   Oral   SpO2: 97% 99%  99%  Weight:      Height:       Exam Gen alert, no distress, on RA No rash, cyanosis or gangrene Sclera anicteric, throat clear  No jvd or bruits Chest clear bilat to bases, no rales/ wheezing RRR no MRG Abd soft ntnd no mass or ascites +bs GU defer MS no joint effusions or deformity Ext no LE or UE edema, no wounds or ulcers Neuro is alert, Ox 3 , nf    RUA AVF+bruit      Home meds include - asa, phoslo, coreg 25 bid, neurontin 100 tid, renavite, olmesartan 150 qd, protonix, prns/ vits/ supps    CXR - negative for acute disease      OP HD: [facility] MWF 4h  450/ 1.5  100kg  2/2 bath  RUA AVF   Heparin 6000 - rocaltrol 1.25 mcg po tiw - sensipar 30mg  po tiw  - last HD 3/18, post wt 101.9kg     Assessment/ Plan: Chest pain - w/ hx of intermediate risk perfusion scan in feb 2023. Cardiology consulting. Per pmd/ cards.  ESRD - on HD MWF, has not missed. HD tomorrow.  HTN - BP's are a bit high, cont home meds.  Volume - no vol excess/ edema on exam, get wts when in a room. UF 3-4 L w/ HD tomorrow.  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 Cont sensipar and po vdra w/ HD tiw.  DM2- per pmd     Kelly Splinter  MD [facility] 09/12/2022, 1:00 PM  Recent Labs  Lab 09/12/22 0330 09/12/22 0552  HGB 13.6  --   CALCIUM SPECIMEN HEMOLYZED. HEMOLYSIS MAY AFFECT INTEGRITY OF RESULTS. 8.5*  CREATININE SPECIMEN HEMOLYZED. HEMOLYSIS MAY AFFECT INTEGRITY OF RESULTS. 10.51*  K SPECIMEN HEMOLYZED. HEMOLYSIS MAY AFFECT INTEGRITY OF RESULTS. 4.5   Inpatient medications:  [START ON 09/13/2022] aspirin EC  81 mg Oral Daily   calcium acetate  1,334 mg Oral TID with meals   carvedilol  25 mg Oral BID WC   diclofenac Sodium  2 g Topical TID   gabapentin  100 mg Oral TID   heparin  5,000 Units Subcutaneous Q8H   insulin aspart  0-6 Units Subcutaneous TID WC   irbesartan  150 mg Oral Daily   lidocaine  1 patch Transdermal Q24H   multivitamin  1 tablet Oral Daily   pantoprazole  40 mg Oral Daily    acetaminophen, fentaNYL (SUBLIMAZE) injection

## 2022-09-12 NOTE — ED Triage Notes (Signed)
Pt arrived by EMS from home complaining of chest pain that woke him from sleeping around 0145 this morning. Pt is in center of chest and feels hard to breath  EMS gave 324 Asprin and 1 nitro tabet. Pt not has headache and states that it did not improved his Chest pain   Pt has a normal dialysis run yesterday that finished aroun 9pm. He goes M/W/F   Pt is blind

## 2022-09-12 NOTE — Assessment & Plan Note (Addendum)
Patient moderately hypertensive in ED, reports no missed medications.  Home meds include olmesartan 40 mg tablet daily, Coreg 25 mg twice daily, and aspirin 81 mg daily.  After last admission in February 2024, was assigned cardiologist Dr. Marlou Porch at [facility] heart care, however has not yet connected. - Cardiology consulted for chest pain, see problem above - Vital signs per unit routine - Continue home medications listed above

## 2022-09-12 NOTE — Assessment & Plan Note (Signed)
Patient is ESRD on HD MWF with right fistula.  Attends [facility] at [facility]. He cannot recall the name of his nephrologist, but I assume he is cared for by [place] [facility] based on the nephrology notes from prior hospitalization. Pertinent HD details below, taken from nephrology note last hospitalization.  It is noted that he usually has large volume output, around 6-8 L.  I have consulted nephrology to put him on the schedule for tomorrow.   180NRe 4 hours BFR 450 DFR Auto 1.5 EDW 100kg 2K 2CaAVF 15g  Heparin 6000 unit bolus  Mircera 270mcg IV q 2 weeks  Venofer 50mg  weekly  Calcitriol 1.33mcg PO q HD  Sensipar 30mg  q HD    BP meds: coreg 35mg  BID, olmesartan 20mg  dialy  Binder: Calcium acetate 3 tabs per meal

## 2022-09-12 NOTE — Progress Notes (Signed)
FMTS Brief Progress Note  S:Went bedside to see patient. Patient sitting up in bed, comfortable, no complaints, awaiting evening meal.  O: BP 128/78 (BP Location: Left Arm)   Pulse 77   Temp 98.1 F (36.7 C) (Oral)   Resp 16   Ht 6\' 1"  (1.854 m)   Wt 102.1 kg   SpO2 99%   BMI 29.69 kg/m   General: NAD  A/P: Chest Pain  Cards believes ACS no cause of chest pain and recommending medical optimization.  -ASA 81mg  daily, coreg 25mg  BID, olmesartan 20mg  daily, and Crestor 20mg   -Plans per day team  ESRD Nephrology consulted, plans for HD tomorrow -AM RFP and CBC - Orders reviewed. Labs for AM ordered, which was adjusted as needed.   Holley Bouche, MD 09/12/2022, 8:13 PM PGY-2, [place] Night Resident  Please page [phone] with questions.

## 2022-09-12 NOTE — Assessment & Plan Note (Signed)
Well-managed, not currently on any diabetes medication.  Last A1c 4.7 on 08/06/2022, although A1c notoriously difficult in ESRD HD patients. - Very sensitive sliding scale insulin 3 times daily with meals - Would avoid insulin administration while patient is n.p.o. sips with meds

## 2022-09-12 NOTE — ED Notes (Signed)
ED TO INPATIENT HANDOFF REPORT  ED Nurse Name and Phone #: [phone]  S Name/Age/Gender Corey Park 52 y.o. male Room/Bed: 027C/027C  Code Status   Code Status: Full Code  Home/SNF/Other Home Patient oriented to: self, place, time, and situation Is this baseline? Yes   Triage Complete: Triage complete  Chief Complaint Chest pain [R07.9]  Triage Note Pt arrived by EMS from home complaining of chest pain that woke him from sleeping around 0145 this morning. Pt is in center of chest and feels hard to breath  EMS gave 324 Asprin and 1 nitro tabet. Pt not has headache and states that it did not improved his Chest pain   Pt has a normal dialysis run yesterday that finished aroun 9pm. He goes M/W/F   Pt is blind    Allergies No Known Allergies  Level of Care/Admitting Diagnosis ED Disposition     ED Disposition  Admit   Condition  --   [place]: [facility] [100100]  Level of Care: Med-Surg [16]  May place patient in observation at [facility] or [facility] if equivalent level of care is available:: No  Covid Evaluation: Asymptomatic - no recent exposure (last 10 days) testing not required  Diagnosis: Chest pain [id number]  Admitting Physician: Ezequiel Essex [id number]  Attending Physician: Lissa Morales D [[id number]]          B Medical/Surgery History Past Medical History:  Diagnosis Date   Acute CHF (congestive heart failure) ([place]) 07/27/2019   AKI (acute kidney injury) ([place]) 12/11/2016   Allergy, unspecified, initial encounter 08/25/2019   Anemia 2021   Anesthesia of skin 03/31/2020   Asthma    as a child   Blind    Cellulitis, perineum 11/26/2019   CHF (congestive heart failure) ([place]) 2021   Chronic kidney disease, stage V ([place]) 06/17/2018   Chronic kidney disease, stage V ([place]) 123456   Complication of vascular dialysis catheter 08/25/2019   COVID-19 virus infection 07/27/2019   Diabetes mellitus without complication ([place])     type 2   Dilated cardiomyopathy ([place]) 07/03/2018   Elevated troponin    Encounter for removal of sutures 08/04/2020   Fe deficiency anemia 12/23/2018   Fluid overload 12/11/2016   Hypertension    Hypertensive urgency 12/11/2016   Hypomagnesemia 12/13/2016   Legally blind    B/L   Pain, unspecified 10/17/2019   Pneumonia 2021   Renal disorder    dialysis T-TH-Sat   Shortness of breath 11/10/2019   Symptomatic anemia 12/21/2018   Type 2 diabetes mellitus with diabetic peripheral angiopathy without gangrene ([place]) 08/25/2019   Unspecified protein-calorie malnutrition ([place]) 08/25/2019   Past Surgical History:  Procedure Laterality Date   BASCILIC VEIN TRANSPOSITION Right 10/16/2019   Procedure: Basilic Vein Transposition Right Arm;  Surgeon: Serafina Mitchell, MD;  [facility];  Service: Vascular;  Laterality: Right;   [place] Right 01/29/2020   Procedure: RIGHT ARM SECOND STAGE [place];  Surgeon: Serafina Mitchell, MD;  Location: [place];  Service: Vascular;  Laterality: Right;   BIOPSY  12/22/2018   Procedure: BIOPSY;  Surgeon: Laurence Spates, MD;  Location: [facility] ENDOSCOPY;  Service: Endoscopy;;   ESOPHAGOGASTRODUODENOSCOPY N/A 12/22/2018   Procedure: ESOPHAGOGASTRODUODENOSCOPY (EGD);  Surgeon: Laurence Spates, MD;  Location: [facility] ENDOSCOPY;  Service: Endoscopy;  Laterality: N/A;   IR FLUORO GUIDE CV LINE RIGHT  07/29/2019   IR US GUIDE VASC ACCESS RIGHT  07/29/2019     A  IV Location/Drains/Wounds Patient Lines/Drains/Airways Status     Active Line/Drains/Airways     Name Placement date Placement time Site Days   Fistula / Graft Right Upper arm Arteriovenous fistula 10/16/19  0833  Upper arm  1062            Intake/Output Last 24 hours No intake or output data in the 24 hours ending 09/12/22 1237  Labs/Imaging Results for orders placed or performed during the hospital encounter of 09/12/22 (from the past 48 hour(s))  Basic metabolic panel     Status:  None   Collection Time: 09/12/22  3:30 AM  Result Value Ref Range   Sodium  135 - 145 mmol/L    SPECIMEN HEMOLYZED. HEMOLYSIS MAY AFFECT INTEGRITY OF RESULTS.    Comment: ORDERING RECOLLECT PER JULIA TURNBOW RN 09/12/22 0434 M KOROLESKI   Potassium  3.5 - 5.1 mmol/L    SPECIMEN HEMOLYZED. HEMOLYSIS MAY AFFECT INTEGRITY OF RESULTS.    Comment: ORDERING RECOLLECT PER JULIA TURNBOW RN 09/12/22 0434 M KOROLESKI   Chloride  98 - 111 mmol/L    SPECIMEN HEMOLYZED. HEMOLYSIS MAY AFFECT INTEGRITY OF RESULTS.    Comment: ORDERING RECOLLECT PER JULIA TURNBOW RN 09/12/22 0434 M KOROLESKI   CO2  22 - 32 mmol/L    SPECIMEN HEMOLYZED. HEMOLYSIS MAY AFFECT INTEGRITY OF RESULTS.    Comment: ORDERING RECOLLECT PER JULIA TURNBOW RN 09/12/22 0434 M KOROLESKI   Glucose, Bld  70 - 99 mg/dL    SPECIMEN HEMOLYZED. HEMOLYSIS MAY AFFECT INTEGRITY OF RESULTS.    Comment: ORDERING RECOLLECT PER JULIA TURNBOW RN 09/12/22 0434 M KOROLESKI   BUN  6 - 20 mg/dL    SPECIMEN HEMOLYZED. HEMOLYSIS MAY AFFECT INTEGRITY OF RESULTS.    Comment: ORDERING RECOLLECT PER JULIA TURNBOW RN 09/12/22 0434 M KOROLESKI   Creatinine, Ser  0.61 - 1.24 mg/dL    SPECIMEN HEMOLYZED. HEMOLYSIS MAY AFFECT INTEGRITY OF RESULTS.    Comment: ORDERING RECOLLECT PER JULIA TURNBOW RN 09/12/22 0434 M KOROLESKI   Calcium  8.9 - 10.3 mg/dL    SPECIMEN HEMOLYZED. HEMOLYSIS MAY AFFECT INTEGRITY OF RESULTS.    Comment: ORDERING RECOLLECT PER JULIA TURNBOW RN 09/12/22 0434 M KOROLESKI   GFR, Estimated  >60 mL/min    SPECIMEN HEMOLYZED. HEMOLYSIS MAY AFFECT INTEGRITY OF RESULTS.    Comment: ORDERING RECOLLECT PER JULIA TURNBOW RN 09/12/22 0434 M KOROLESKI   GFR calc Af Amer  >60 mL/min    SPECIMEN HEMOLYZED. HEMOLYSIS MAY AFFECT INTEGRITY OF RESULTS.    Comment: ORDERING RECOLLECT PER JULIA TURNBOW RN 09/12/22 0434 M KOROLESKI   Anion gap  5 - 15    SPECIMEN HEMOLYZED. HEMOLYSIS MAY AFFECT INTEGRITY OF RESULTS.    Comment: ORDERING RECOLLECT PER JULIA  TURNBOW RN 09/12/22 [place] Performed at [facility], [place] [address] [place], [place] [address]   CBC     Status: Abnormal   Collection Time: 09/12/22  3:30 AM  Result Value Ref Range   WBC 6.2 4.0 - 10.5 K/uL   RBC 4.35 4.22 - 5.81 MIL/uL   Hemoglobin 13.6 13.0 - 17.0 g/dL   HCT 40.7 39.0 - 52.0 %   MCV 93.6 80.0 - 100.0 fL   MCH 31.3 26.0 - 34.0 pg   MCHC 33.4 30.0 - 36.0 g/dL   RDW 16.9 (H) 11.5 - 15.5 %   Platelets 188 150 - 400 K/uL   nRBC 0.0 0.0 - 0.2 %    Comment: Performed at [facility], [place] [address], [place], [place] [address]  Troponin I (High Sensitivity)     Status: Abnormal   Collection Time: 09/12/22  3:30 AM  Result Value Ref Range   Troponin I (High Sensitivity) 110 (HH) <18 ng/L    Comment: CRITICAL RESULT CALLED TO, READ BACK BY AND VERIFIED WITH JULIA TURNBOW RN 09/12/22 0435 M KOROLESKI (NOTE) Elevated high sensitivity troponin I (hsTnI) values and significant  changes across serial measurements may suggest ACS but many other  chronic and acute conditions are known to elevate hsTnI results.  Refer to the "Links" section for chest pain algorithms and additional  guidance. Performed at [facility], [place] [address], [place], [place] [address]   Resp panel by RT-PCR (RSV, Flu A&B, Covid) Anterior Nasal Swab     Status: None   Collection Time: 09/12/22  3:37 AM   Specimen: Anterior Nasal Swab  Result Value Ref Range   SARS Coronavirus 2 by RT PCR NEGATIVE NEGATIVE   Influenza A by PCR NEGATIVE NEGATIVE   Influenza B by PCR NEGATIVE NEGATIVE    Comment: (NOTE) The Xpert Xpress SARS-CoV-2/FLU/RSV plus assay is intended as an aid in the diagnosis of influenza from Nasopharyngeal swab specimens and should not be used as a sole basis for treatment. Nasal washings and aspirates are unacceptable for Xpert Xpress SARS-CoV-2/FLU/RSV testing.  Fact Sheet for Patients: EntrepreneurPulse.com.au  Fact Sheet for  Healthcare Providers: IncredibleEmployment.be  This test is not yet approved or cleared by the [place] FDA and has been authorized for detection and/or diagnosis of SARS-CoV-2 by FDA under an Emergency Use Authorization (EUA). This EUA will remain in effect (meaning this test can be used) for the duration of the COVID-19 declaration under Section 564(b)(1) of the Act, 21 U.S.C. section 360bbb-3(b)(1), unless the authorization is terminated or revoked.     Resp Syncytial Virus by PCR NEGATIVE NEGATIVE    Comment: (NOTE) Fact Sheet for Patients: EntrepreneurPulse.com.au  Fact Sheet for Healthcare Providers: IncredibleEmployment.be  This test is not yet approved or cleared by the [place] FDA and has been authorized for detection and/or diagnosis of SARS-CoV-2 by FDA under an Emergency Use Authorization (EUA). This EUA will remain in effect (meaning this test can be used) for the duration of the COVID-19 declaration under Section 564(b)(1) of the Act, 21 U.S.C. section 360bbb-3(b)(1), unless the authorization is terminated or revoked.  Performed at [facility], [place] [address], [place], [place] [address]   Troponin I (High Sensitivity)     Status: Abnormal   Collection Time: 09/12/22  5:10 AM  Result Value Ref Range   Troponin I (High Sensitivity) 117 (HH) <18 ng/L    Comment: CRITICAL VALUE NOTED. VALUE IS CONSISTENT WITH PREVIOUSLY REPORTED/CALLED VALUE (NOTE) Elevated high sensitivity troponin I (hsTnI) values and significant  changes across serial measurements may suggest ACS but many other  chronic and acute conditions are known to elevate hsTnI results.  Refer to the "Links" section for chest pain algorithms and additional  guidance. Performed at [facility], [place] [address] [place], [place] [id number]   Basic metabolic panel     Status: Abnormal   Collection Time: 09/12/22  5:52 AM  Result  Value Ref Range   Sodium 136 135 - 145 mmol/L   Potassium 4.5 3.5 - 5.1 mmol/L   Chloride 92 (L) 98 - 111 mmol/L   CO2 25 22 - 32 mmol/L   Glucose, Bld 88 70 - 99  mg/dL    Comment: Glucose reference range applies only to samples taken after fasting for at least 8 hours.   BUN 40 (H) 6 - 20 mg/dL   Creatinine, Ser 10.51 (H) 0.61 - 1.24 mg/dL   Calcium 8.5 (L) 8.9 - 10.3 mg/dL   GFR, Estimated 5 (L) >60 mL/min    Comment: (NOTE) Calculated using the CKD-EPI Creatinine Equation (2021)    Anion gap 19 (H) 5 - 15    Comment: Performed at [place] [address] [place], [place] [address]  Troponin I (High Sensitivity)     Status: Abnormal   Collection Time: 09/12/22  9:24 AM  Result Value Ref Range   Troponin I (High Sensitivity) 130 (HH) <18 ng/L    Comment: CRITICAL VALUE NOTED. VALUE IS CONSISTENT WITH PREVIOUSLY REPORTED/CALLED VALUE (NOTE) Elevated high sensitivity troponin I (hsTnI) values and significant  changes across serial measurements may suggest ACS but many other  chronic and acute conditions are known to elevate hsTnI results.  Refer to the "Links" section for chest pain algorithms and additional  guidance. Performed at [facility], [place] [address] [place], [place] [address]    DG Chest Port 1 View  Result Date: 09/12/2022 CLINICAL DATA:  Chest pain. EXAM: PORTABLE CHEST 1 VIEW COMPARISON:  CTA chest 08/05/2022 FINDINGS: There is mild cardiomegaly with no evidence of CHF. The lungs are clear. No pleural effusion is seen. The mediastinum is normally outlined. There is calcification of the transverse aorta. Thoracic cage is intact. There have been no significant interval changes. IMPRESSION: 1. Mild cardiomegaly with no acute radiographic chest findings. 2. Aortic atherosclerosis. Electronically Signed   By: Telford Nab M.D.   On: 09/12/2022 04:52    Pending Labs Unresulted Labs (From admission, onward)     Start     Ordered   09/13/22 0833  CBC   Every Mon-Wed-Fri,   R      09/12/22 0832   09/13/22 0832  Renal function panel  Every Mon-Wed-Fri,   R      09/12/22 0831            Vitals/Pain Today's Vitals   09/12/22 1000 09/12/22 1030 09/12/22 1121 09/12/22 1130  BP: (!) 149/101 (!) 144/89  (!) 160/96  Pulse: 70 69  74  Resp: (!) 7 18  19   Temp:   98.1 F (36.7 C)   TempSrc:   Oral   SpO2: 97% 99%  99%  Weight:      Height:      PainSc:        Isolation Precautions No active isolations  Medications Medications  aspirin EC tablet 81 mg (has no administration in time range)  calcium acetate (PHOSLO) capsule 1,334 mg (1,334 mg Oral Given 09/12/22 0857)  carvedilol (COREG) tablet 25 mg (25 mg Oral Given 09/12/22 0857)  diclofenac Sodium (VOLTAREN) 1 % topical gel 2 g (has no administration in time range)  gabapentin (NEURONTIN) capsule 100 mg (100 mg Oral Given 09/12/22 0858)  multivitamin (RENA-VIT) tablet 1 tablet (has no administration in time range)  irbesartan (AVAPRO) tablet 150 mg (150 mg Oral Given 09/12/22 0858)  pantoprazole (PROTONIX) EC tablet 40 mg (40 mg Oral Given 09/12/22 0857)  insulin aspart (novoLOG) injection 0-6 Units (has no administration in time range)  heparin injection 5,000 Units (5,000 Units Subcutaneous Given 09/12/22 0859)  acetaminophen (TYLENOL) tablet 650 mg (has no administration in time range)  lidocaine (LIDODERM) 5 % 1 patch (  has no administration in time range)  fentaNYL (SUBLIMAZE) injection 25 mcg (has no administration in time range)    Mobility walks     Focused Assessments Cardiac Assessment Handoff:    Lab Results  Component Value Date   TROPONINI 0.18 ([place]) 06/17/2018   Lab Results  Component Value Date   DDIMER 1.42 (H) 08/03/2019   Does the Patient currently have chest pain? Yes   , Renal Assessment Handoff:  Hemodialysis Schedule: Hemodialysis Schedule: Monday/Wednesday/Friday Last Hemodialysis date and time: 3/18   Restricted appendage: right     R Recommendations: See Admitting Provider Note  Report given to:   Additional Notes:

## 2022-09-12 NOTE — Progress Notes (Addendum)
FMTS Interim Progress Note  Family medicine service received page for admission just before signout around 0630. This provider went to see patient. Patient stable, w/ stable vitals, sating well on RA, and conversational. Patient continues to note chest pain. Informed patient and ED provider that someone from our team would be by to admit him this morning.   Family medicine teaching service will be admitting this patient.   FAMILY MEDICINE TEACHING SERVICE Patient - Please contact intern pager [phone] or text page via website [email or web address] ([email or web address]: [email or web address]) for questions regarding care. DO NOT page listed attending provider unless there is no answer from the number above.    Holley Bouche, MD 09/12/2022, 7:11 AM PGY-2, [place] Medicine Service pager [phone]

## 2022-09-12 NOTE — Assessment & Plan Note (Signed)
Now resolved. Cardiology assessed patient and recommended medical management and will not pursue cath inpatient. -Cardiology has been consulted, appreciate their care of this patient -Lidocaine patch to tender chest wall area -Tylenol every 6 for mild pain -Fentanyl 25 mcg IV every 2 for severe pain

## 2022-09-12 NOTE — H&P (Addendum)
Hospital Admission History and Physical Service Pager: [phone]  Patient name: Corey Park Medical record number: [id number] Date of Birth: 15-Aug-1970 Age: 52 y.o. Gender: male  Primary Care Provider: Leslie Dales, DO Consultants: Cardiology, nephrology Code Status: Full Preferred Emergency Contact: Corey Park [phone]   Chief Complaint: Chest pain  Assessment and Plan: Corey Park is a 52 y.o. male presenting with chest pain. Differential for this patient's presentation of this includes STEMI, NSTEMI, angina, costochondritis, pneumonia, CHF exacerbation, fluid overload.  *Providers and staff please note patient is blind*  * Chest pain Concerning factors for ACS include sudden chest pain and headache that woke him from sleep, reported dyspnea to EMS, and troponins elevated 20% from his baseline.  Per Corey Park note from most recent cardiac perfusion scan 08/17/22, showed intermediate risk stress test and would consider cardiac catheterization if symptoms worsen.  - Admit to [place] floor, family medicine service, Corey Park attending - Cardiology has been consulted, appreciate their care of this patient - Will obtain 1 additional troponin for trending - EKG every 2 to 3 x 24 hours - NPO sips with meds for now until cardiology has evaluated - Lidocaine patch to tender chest wall area - Tylenol every 6 for mild pain - Fentanyl 25 mcg IV every 2 for severe pain  Hypertensive heart disease with CHF (congestive heart failure) ([place]) Patient moderately hypertensive in ED, reports no missed medications.  Home meds include olmesartan 40 mg tablet daily, Coreg 25 mg twice daily, and aspirin 81 mg daily.  After last admission in February 2024, was assigned cardiologist Corey Park at [facility] heart care, however has not yet connected. - Cardiology consulted for chest pain, see problem above - Vital signs per unit routine - Continue home medications listed above  ESRD on  hemodialysis [facility]) Patient is ESRD on HD MWF with right fistula.  Attends [facility] kidney [facility] at [facility]. He cannot recall the name of his nephrologist, but I assume he is cared for by [place] [facility] based on the nephrology notes from prior hospitalization. Pertinent HD details below, taken from nephrology note last hospitalization.  It is noted that he usually has large volume output, around 6-8 L.  I have consulted nephrology to put him on the schedule for tomorrow.   180NRe 4 hours BFR 450 DFR Auto 1.5 EDW 100kg 2K 2CaAVF 15g  Heparin 6000 unit bolus  Mircera 231mcg IV q 2 weeks  Venofer 50mg  weekly  Calcitriol 1.29mcg PO q HD  Sensipar 30mg  q HD    BP meds: coreg 35mg  BID, olmesartan 20mg  dialy  Binder: Calcium acetate 3 tabs per meal   DM (diabetes mellitus), type 2 (Corey Park) Well-managed, not currently on any diabetes medication.  Last A1c 4.7 on 08/06/2022, although A1c notoriously difficult in ESRD HD patients. - Very sensitive sliding scale insulin 3 times daily with meals - Would avoid insulin administration while patient is n.p.o. sips with meds   FEN/GI: N.p.o. until cardiology evaluates VTE Prophylaxis: Heparin subcu  Disposition: MedSurg  History of Present Illness:  Corey Park is a 52 y.o. male presenting with chest pain and headache that woke him from sleep overnight.  Pain is in center of the chest.  Called EMS, who gave aspirin and 1 nitro tablet and route.  Patient reports this did not improve any chest pain.  In the ED, patient's vitals remained stable, SpO2 normal on room air, normal pulse.  Patient is moderately hypertensive, although known history of HTN.  ED provider reported EKG is reassuring, as is consistent with October's EKG.  Troponins in the ED 110 and 117, elevated approximately 20% above baseline of 95 (from October 2023).  Other labs unconcerning: BMP consistent with known ESRD on HD.  CBC stable, noted to have normal hemoglobin despite  history of anemia of chronic disease.  Respiratory swab negative for flu, COVID, and RSV.  No medications given while patient was in the ED.  Review Of Systems: Per HPI with the following additions: None additional  Pertinent Past Medical History: HFpEF Anemia of chronic disease Chronic hiccups T2DM ESRD on HD MWF GERD HLD HTN GI bleed MI Secondary hyperparathyroidism of renal origin  Remainder reviewed in history tab.   Pertinent Past Surgical History: 12/22/2018 EGD with biopsy 07/29/2019 IR fluoroscopy guided central line [id number] right basilic vein transposition 01/29/20 second stage basilic vein transposition  Remainder reviewed in history tab.   Pertinent Social History: Tobacco use: Never Alcohol use: Not currently Other Substance use: None  Pertinent Family History: HTN-mother CAD-mother  Remainder reviewed in history tab.   Important Outpatient Medications: Aspirin 81 mg Calcium acetate 1334 mg 3 times daily with meals Coreg 25 mg twice daily Diclofenac gel 3 times daily Gabapentin 100 mg 3 times daily Renal vitamin daily Olmesartan 20 mg daily Pantoprazole 40 mg daily  Remainder reviewed in medication history.   Objective: BP (!) 144/90 (BP Location: Right Arm)   Pulse 69   Temp 97.9 F (36.6 C) (Oral)   Resp (!) 23   Ht 6\' 1"  (1.854 m)   Wt 102.1 kg   SpO2 100%   BMI 29.69 kg/m  Exam: General: Awake, alert, NAD Cardiovascular: Regular rate and rhythm, no murmurs appreciated, no BLE edema Respiratory: CTAB, no crackles, wheezes, or rales Gastrointestinal: Soft abdomen, nondistended, moderate TTP over right epigastrium and RUQ, all other areas nontender MSK: Severe TTP of right chest wall 2 inches inferior to nipple along mid clavicular line, mild TTP of left inferior chest wall between sternum and nipple Psych: Calm, normal insight and judgment  Labs:  CBC BMET  Recent Labs  Lab 09/12/22 0330  WBC 6.2  HGB 13.6  HCT 40.7  PLT 188    Recent Labs  Lab 09/12/22 0552  NA 136  K 4.5  CL 92*  CO2 25  BUN 40*  CREATININE 10.51*  GLUCOSE 88  CALCIUM 8.5*    Troponin: 110, 117 Baseline troponin appears to be around 95 (October 2023)  EKG: Sinus rhythm, rate 66 bpm, normal QTc at 480 MS, large T waves in V1 through V3, T wave inversions seen in V6 - Different from February EKG, particularly T waves in V1-V3, V6 - Reassuringly similar to EKG from October 2023 - Pending repeat   Imaging Studies Performed:  CXR: Mild cardiomegaly and aortic atherosclerosis, no acute findings.  I have personally reviewed the image myself and agree with the findings.  Of note, heart size appears improved from CXR taken 08/05/2022 at last hospitalization.    Ezequiel Essex, MD 09/12/2022, 9:35 AM PGY-3, [place] Intern pager: [phone], text pages welcome Secure chat group [place]

## 2022-09-12 NOTE — ED Notes (Signed)
Report called to jessica RN

## 2022-09-12 NOTE — ED Provider Notes (Signed)
[facility] Emergency Department Provider Note MRN:  [id number]  Arrival date & time: 09/12/22     Chief Complaint   Chest Pain   History of Present Illness   Corey Park is a 52 y.o. year-old male presents to the ED with chief complaint of chest pain that awakened him from sleep at [place].  States that the pain is in the center of his chest.  He reports some SOB.  He is on dialysis and was last dialyzed yesterday evening, completing the dialysis around 9pm.  He was treated with 324mg  of ASA by EMS and 1 SL nitro without relief.  He reports having a headache.  Denies cough or fever, but states that he does feel cold.Corey Park  History provided by patient.   Review of Systems  Pertinent positive and negative review of systems noted in HPI.    Physical Exam   Vitals:   09/12/22 0302 09/12/22 0530  BP: (!) 163/91 125/85  Pulse: 78 72  Resp: 18 19  Temp: 98 F (36.7 C)   SpO2: 98% 99%    CONSTITUTIONAL:  non toxic-appearing, NAD NEURO:  Alert and oriented x 3, CN 3-12 grossly intact EYES:  eyes equal and reactive ENT/NECK:  Supple, no stridor  CARDIO:  normal rate, regular rhythm, appears well-perfused  PULM:  No respiratory distress, CTAB GI/GU:  non-distended,  MSK/SPINE:  No gross deformities, no edema, moves all extremities  SKIN:  no rash, atraumatic   *Additional and/or pertinent findings included in MDM below  Diagnostic and Interventional Summary    EKG Interpretation  Date/Time:  Tuesday September 12 2022 02:55:54 EDT Ventricular Rate:  80 PR Interval:  176 QRS Duration: 114 QT Interval:  418 QTC Calculation: 482 R Axis:   117 Text Interpretation: Normal sinus rhythm Left posterior fascicular block Minimal voltage criteria for LVH, may be normal variant ( Cornell product ) Possible Inferior infarct , age undetermined Abnormal ECG Confirmed by Ripley Fraise [phone]) on 09/12/2022 3:55:24 AM       Labs Reviewed  CBC - Abnormal; Notable  for the following components:      Result Value   RDW 16.9 (*)    All other components within normal limits  BASIC METABOLIC PANEL - Abnormal; Notable for the following components:   Chloride 92 (*)    BUN 40 (*)    Creatinine, Ser 10.51 (*)    Calcium 8.5 (*)    GFR, Estimated 5 (*)    Anion gap 19 (*)    All other components within normal limits  TROPONIN I (HIGH SENSITIVITY) - Abnormal; Notable for the following components:   Troponin I (High Sensitivity) 110 (*)    All other components within normal limits  TROPONIN I (HIGH SENSITIVITY) - Abnormal; Notable for the following components:   Troponin I (High Sensitivity) 117 (*)    All other components within normal limits  RESP PANEL BY RT-PCR (RSV, FLU A&B, COVID)  [id number]  BASIC METABOLIC PANEL  BASIC METABOLIC PANEL    DG Chest Port 1 View  Final Result      Medications - No data to display    Procedures  /  Critical Care Procedures  ED Course and Medical Decision Making  I have reviewed the triage vital signs, the nursing notes, and pertinent available records from the EMR.  Social Determinants Affecting Complexity of Care: Patient has no clinically significant social determinants affecting this chief complaint..   ED Course:    Medical  Decision Making Amount and/or Complexity of Data Reviewed Labs: ordered. Radiology: ordered.  Risk OTC drugs. Decision regarding hospitalization.     Consultants: I consulted with family practice, who will come admit.   Treatment and Plan: Patient's exam and diagnostic results are concerning for chest pain.  Feel that patient will need admission to the hospital for further treatment and evaluation.  Patient seen by and discussed with attending physician, Dr. Christy Gentles, who agrees with plan for admission.  Final Clinical Impressions(s) / ED Diagnoses     ICD-10-CM   1. Chest pain, unspecified type  R07.9       ED Discharge Orders     None         Discharge  Instructions Discussed with and Provided to Patient:   Discharge Instructions   None      Montine Circle, PA-C 09/12/22 [id number]    Ripley Fraise, MD 09/12/22 [phone]

## 2022-09-13 ENCOUNTER — Other Ambulatory Visit (HOSPITAL_COMMUNITY): Payer: Self-pay

## 2022-09-13 DIAGNOSIS — I110 Hypertensive heart disease with heart failure: Secondary | ICD-10-CM | POA: Diagnosis not present

## 2022-09-13 DIAGNOSIS — Z992 Dependence on renal dialysis: Secondary | ICD-10-CM | POA: Diagnosis not present

## 2022-09-13 DIAGNOSIS — R072 Precordial pain: Secondary | ICD-10-CM | POA: Diagnosis not present

## 2022-09-13 DIAGNOSIS — N186 End stage renal disease: Secondary | ICD-10-CM | POA: Diagnosis not present

## 2022-09-13 DIAGNOSIS — I5042 Chronic combined systolic (congestive) and diastolic (congestive) heart failure: Secondary | ICD-10-CM | POA: Diagnosis not present

## 2022-09-13 LAB — CBC
HCT: 41.3 % (ref 39.0–52.0)
Hemoglobin: 13.8 g/dL (ref 13.0–17.0)
MCH: 30.7 pg (ref 26.0–34.0)
MCHC: 33.4 g/dL (ref 30.0–36.0)
MCV: 91.8 fL (ref 80.0–100.0)
Platelets: 149 10*3/uL — ABNORMAL LOW (ref 150–400)
RBC: 4.5 MIL/uL (ref 4.22–5.81)
RDW: 16.8 % — ABNORMAL HIGH (ref 11.5–15.5)
WBC: 5.8 10*3/uL (ref 4.0–10.5)
nRBC: 0 % (ref 0.0–0.2)

## 2022-09-13 LAB — RENAL FUNCTION PANEL
Albumin: 3.3 g/dL — ABNORMAL LOW (ref 3.5–5.0)
Anion gap: 16 — ABNORMAL HIGH (ref 5–15)
BUN: 55 mg/dL — ABNORMAL HIGH (ref 6–20)
CO2: 26 mmol/L (ref 22–32)
Calcium: 8.6 mg/dL — ABNORMAL LOW (ref 8.9–10.3)
Chloride: 94 mmol/L — ABNORMAL LOW (ref 98–111)
Creatinine, Ser: 12.96 mg/dL — ABNORMAL HIGH (ref 0.61–1.24)
GFR, Estimated: 4 mL/min — ABNORMAL LOW (ref >60)
Glucose, Bld: 178 mg/dL — ABNORMAL HIGH (ref 70–99)
Phosphorus: 9.3 mg/dL — ABNORMAL HIGH (ref 2.5–4.6)
Potassium: 4.6 mmol/L (ref 3.5–5.1)
Sodium: 136 mmol/L (ref 135–145)

## 2022-09-13 LAB — HEMOGLOBIN A1C
Hgb A1c MFr Bld: 5.6 % (ref 4.8–5.6)
Mean Plasma Glucose: 114 mg/dL

## 2022-09-13 LAB — GLUCOSE, CAPILLARY: Glucose-Capillary: 128 mg/dL — ABNORMAL HIGH (ref 70–99)

## 2022-09-13 MED ORDER — CARVEDILOL 25 MG PO TABS
25.0000 mg | ORAL_TABLET | Freq: Two times a day (BID) | ORAL | 0 refills | Status: DC
  Filled 2022-09-13: qty 60, 30d supply, fill #0

## 2022-09-13 MED ORDER — LIDOCAINE 5 % EX PTCH
1.0000 | MEDICATED_PATCH | CUTANEOUS | 0 refills | Status: DC
  Filled 2022-09-13: qty 30, 30d supply, fill #0

## 2022-09-13 MED ORDER — HEPARIN SODIUM (PORCINE) 1000 UNIT/ML IJ SOLN
INTRAMUSCULAR | Status: AC
  Administered 2022-09-13: 3000 [IU]
  Filled 2022-09-13: qty 3

## 2022-09-13 MED ORDER — ORAL CARE MOUTH RINSE: 15.0000 mL | OROMUCOSAL | Status: DC | PRN

## 2022-09-13 MED ORDER — RENA-VITE PO TABS
1.0000 | ORAL_TABLET | Freq: Every day | ORAL | 0 refills | Status: AC
  Filled 2022-09-13: qty 30, 30d supply, fill #0

## 2022-09-13 MED ORDER — CALCIUM ACETATE (PHOS BINDER) 667 MG PO CAPS
1334.0000 mg | ORAL_CAPSULE | Freq: Three times a day (TID) | ORAL | 0 refills | Status: AC
  Filled 2022-09-13: qty 180, 30d supply, fill #0

## 2022-09-13 MED ORDER — ROSUVASTATIN CALCIUM 10 MG PO TABS
10.0000 mg | ORAL_TABLET | Freq: Every day | ORAL | 0 refills | Status: DC
  Filled 2022-09-13: qty 30, 30d supply, fill #0

## 2022-09-13 MED ORDER — OLMESARTAN MEDOXOMIL 20 MG PO TABS
20.0000 mg | ORAL_TABLET | Freq: Every day | ORAL | 0 refills | Status: DC
  Filled 2022-09-13: qty 30, 30d supply, fill #0

## 2022-09-13 MED ORDER — HEPARIN SODIUM (PORCINE) 1000 UNIT/ML IJ SOLN
3000.0000 [IU] | Freq: Once | INTRAMUSCULAR | Status: AC
  Filled 2022-09-13: qty 6

## 2022-09-13 MED ORDER — GABAPENTIN 100 MG PO CAPS
100.0000 mg | ORAL_CAPSULE | Freq: Three times a day (TID) | ORAL | 0 refills | Status: DC
  Filled 2022-09-13: qty 90, 30d supply, fill #0

## 2022-09-13 MED ORDER — PANTOPRAZOLE SODIUM 40 MG PO TBEC
40.0000 mg | DELAYED_RELEASE_TABLET | Freq: Every day | ORAL | 0 refills | Status: DC
  Filled 2022-09-13: qty 30, 30d supply, fill #0

## 2022-09-13 MED ORDER — DICLOFENAC SODIUM 1 % EX GEL
2.0000 g | Freq: Three times a day (TID) | CUTANEOUS | 0 refills | Status: DC
  Filled 2022-09-13: qty 100, 15d supply, fill #0

## 2022-09-13 MED ORDER — ASPIRIN 81 MG PO TBEC
81.0000 mg | DELAYED_RELEASE_TABLET | Freq: Every day | ORAL | 0 refills | Status: AC
  Filled 2022-09-13: qty 30, 30d supply, fill #0

## 2022-09-13 NOTE — Discharge Summary (Signed)
Corey Park Discharge Summary  Patient name: Corey Park Medical record number: [id number] Date of birth: [birth year] Age: 52 y.o. Gender: male Date of Admission: 09/12/2022  Date of Discharge: 09/13/2022 Admitting Physician: Nancie Neas, MD  Primary Care Provider: Leslie Dales, DO Consultants: Cardiology, Nephrology  Indication for Hospitalization: Chest pain  Discharge Diagnoses/Problem List:  Principal Problem for Admission: Chest pain Other Problems addressed during stay:  Principal Problem:   Chest pain Active Problems:   Hypertensive heart disease with CHF (congestive heart failure) ([place])   ESRD on hemodialysis ([place])   DM (diabetes mellitus), type 2 [facility])  Brief Park Course:  [name] is a 52 y.o. male presenting with chest pain. Differential for this patient's presentation of this includes STEMI, NSTEMI, angina, costochondritis, pneumonia, CHF exacerbation, fluid overload. His Park course is outlined below:   Chest pain: Patient presented to the ED with persistent chest pain and headache that woke him from sleep. Received ASA and 1 nitro tablet in route to [place] without symptom improvement. Vitals stable and EKG consistent with prior EKG in October. Troponins in the ED 110 and 117, elevated approximately 20% above baseline of 95 (from October 2023).  Cardiology was consulted and recommended medical therapy with ASA, Coreg 25 mg twice daily, olmesartan 25 mg daily, and adding Crestor 20 mg daily.  His restratification labs noted an A1c of 5.3 and an LDL of 69.  Patient chest pain resolved spontaneously and BP remained well controlled. At discharge he was continued on all of his home medications and prescribed Crestor.  PCP follow-up: Recheck BMP outpatient Follow-up on Crestor compliance, recheck lipid panel in 3 months.   Disposition: Home  Discharge Condition: Stable  Discharge Exam:  Vitals:   09/13/22 1030 09/13/22  1100  BP: 105/83 104/74  Pulse: 85 83  Resp: 19 18  Temp:    SpO2: 95% 94%   General: Laying in bed in HD, eyes closed, NAD Cardiovascular: RRR without murmur Respiratory: CTAB. Normal WOB on RA Abdomen: Soft, non-tender, non-distended Extremities: No peripheral edema  Significant Procedures: None  Significant Labs and Imaging:  Recent Labs  Lab 09/12/22 0330 09/13/22 0820  WBC 6.2 5.8  HGB 13.6 13.8  HCT 40.7 41.3  PLT 188 149*   Recent Labs  Lab 09/12/22 0330 09/12/22 0552 09/12/22 0924 09/13/22 0820  NA SPECIMEN HEMOLYZED. HEMOLYSIS MAY AFFECT INTEGRITY OF RESULTS. 136  --  [place] HEMOLYSIS MAY AFFECT INTEGRITY OF RESULTS. 4.5  --  4.6  CL SPECIMEN HEMOLYZED. HEMOLYSIS MAY AFFECT INTEGRITY OF RESULTS. 92*  --  94*  CO2 SPECIMEN HEMOLYZED. HEMOLYSIS MAY AFFECT INTEGRITY OF RESULTS. 25  --  26  GLUCOSE SPECIMEN HEMOLYZED. HEMOLYSIS MAY AFFECT INTEGRITY OF RESULTS. 88  --  178*  BUN SPECIMEN HEMOLYZED. HEMOLYSIS MAY AFFECT INTEGRITY OF RESULTS. 40*  --  32*  CREATININE SPECIMEN HEMOLYZED. HEMOLYSIS MAY AFFECT INTEGRITY OF RESULTS. 10.51*  --  12.96*  CALCIUM SPECIMEN HEMOLYZED. HEMOLYSIS MAY AFFECT INTEGRITY OF RESULTS. 8.5*  --  8.6*  PHOS  --   --  7.7* 9.3*  ALBUMIN  --   --   --  3.3*   Pertinent Imaging: None  Results/Tests Pending at Time of Discharge: None  Discharge Medications:  Allergies as of 09/13/2022   No Known Allergies      Medication List     TAKE these medications    aspirin EC 81 MG tablet Take 1 tablet (81 mg total) by mouth  daily. Swallow whole.   calcium acetate 667 MG tablet Commonly known as: PHOSLO Take 1,334 mg by mouth 3 (three) times daily.   Coreg 25 MG tablet Generic drug: carvedilol Take 1 tablet (25 mg total) by mouth 2 (two) times daily.   diclofenac Sodium 1 % Gel Commonly known as: VOLTAREN Apply 2 g topically 3 (three) times daily.   gabapentin 100 MG capsule Commonly known as:  NEURONTIN Take 1 capsule (100 mg total) by mouth 3 (three) times daily.   lidocaine 5 % Commonly known as: LIDODERM Place 1 patch onto the skin daily. Remove & Discard patch within 12 hours or as directed by MD   multivitamin Tabs tablet Take 1 tablet by mouth daily.   olmesartan 20 MG tablet Commonly known as: BENICAR Take 1 tablet (20 mg total) by mouth daily.   pantoprazole 40 MG tablet Commonly known as: PROTONIX Take 1 tablet (40 mg total) by mouth daily.   rosuvastatin 10 MG tablet Commonly known as: CRESTOR Take 1 tablet (10 mg total) by mouth at bedtime.        Discharge Instructions: Please refer to Patient Instructions section of EMR for full details.  Patient was counseled important signs and symptoms that should prompt return to medical care, changes in medications, dietary instructions, activity restrictions, and follow up appointments.   Follow-Up Appointments:  Follow-up Information     Shary Key, DO Follow up.   Specialty: Family Medicine Why: 3/27 @ 9:10AM Contact information: [place] [address] [phone]                 Colletta Maryland, MD 09/13/2022, 11:27 AM PGY-1, [place]

## 2022-09-13 NOTE — Progress Notes (Signed)
TOC delivered discharge medications to bedside. Taxi services notified for pickup and voucher provided. Patient transported to the main entrance via wheelchair.

## 2022-09-13 NOTE — Progress Notes (Signed)
[facility] Progress Note   Subjective:   Patient seen and examined at bedside in dialysis.  Reports being sleepy but no other complaints.  States CP resolved yesterday.  Denies SOB, abdominal pain and n/v/d.  Tolerating dialysis well so far.  No plans for heart cath.   Objective Vitals:   09/13/22 0830 09/13/22 0900 09/13/22 0932 09/13/22 1000  BP: (!) 141/87 121/78 117/71 108/67  Pulse: 69 73 74 78  Resp: 19 16 (!) 0 19  Temp:      TempSrc:      SpO2: 97% 92% 95% 92%  Weight:      Height:       Physical Exam General: well appearing, blind male in NAD Heart:RRR, no mrg Lungs:CTAB, nml WOB on RA Abdomen:soft, NTND Extremities:no LE edema Dialysis Access: RU AVF in use   Filed Weights   09/12/22 0302 09/13/22 0806  Weight: 102.1 kg 103.7 kg    Intake/Output Summary (Last 24 hours) at 09/13/2022 1024 Last data filed at 09/13/2022 0740 Gross per 24 hour  Intake 840 ml  Output 200 ml  Net 640 ml    Additional Objective Labs: Basic Metabolic Panel: Recent Labs  Lab 09/12/22 0330 09/12/22 0552 09/12/22 0924 09/13/22 0820  NA SPECIMEN HEMOLYZED. HEMOLYSIS MAY AFFECT INTEGRITY OF RESULTS. 136  --  [place] HEMOLYSIS MAY AFFECT INTEGRITY OF RESULTS. 4.5  --  4.6  CL SPECIMEN HEMOLYZED. HEMOLYSIS MAY AFFECT INTEGRITY OF RESULTS. 92*  --  94*  CO2 SPECIMEN HEMOLYZED. HEMOLYSIS MAY AFFECT INTEGRITY OF RESULTS. 25  --  26  GLUCOSE SPECIMEN HEMOLYZED. HEMOLYSIS MAY AFFECT INTEGRITY OF RESULTS. 88  --  178*  BUN SPECIMEN HEMOLYZED. HEMOLYSIS MAY AFFECT INTEGRITY OF RESULTS. 40*  --  38*  CREATININE SPECIMEN HEMOLYZED. HEMOLYSIS MAY AFFECT INTEGRITY OF RESULTS. 10.51*  --  12.96*  CALCIUM SPECIMEN HEMOLYZED. HEMOLYSIS MAY AFFECT INTEGRITY OF RESULTS. 8.5*  --  8.6*  PHOS  --   --  7.7* 9.3*   Liver Function Tests: Recent Labs  Lab 09/13/22 0820  ALBUMIN 3.3*    CBC: Recent Labs  Lab 09/12/22 0330 09/13/22 0820  WBC 6.2 5.8  HGB  13.6 13.8  HCT 40.7 41.3  MCV 93.6 91.8  PLT 188 149*    CBG: Recent Labs  Lab 09/12/22 1639 09/12/22 2127 09/13/22 0737  GLUCAP 159* 161* 128*   Studies/Results: DG Chest Port 1 View  Result Date: 09/12/2022 CLINICAL DATA:  Chest pain. EXAM: PORTABLE CHEST 1 VIEW COMPARISON:  CTA chest 08/05/2022 FINDINGS: There is mild cardiomegaly with no evidence of CHF. The lungs are clear. No pleural effusion is seen. The mediastinum is normally outlined. There is calcification of the transverse aorta. Thoracic cage is intact. There have been no significant interval changes. IMPRESSION: 1. Mild cardiomegaly with no acute radiographic chest findings. 2. Aortic atherosclerosis. Electronically Signed   By: Telford Nab M.D.   On: 09/12/2022 04:52    Medications:   aspirin EC  81 mg Oral Daily   calcitRIOL  1.25 mcg Oral Q M,W,F-HD   calcium acetate  1,334 mg Oral TID with meals   carvedilol  25 mg Oral BID WC   Chlorhexidine Gluconate Cloth  6 each Topical Q0600   cinacalcet  30 mg Oral Q M,W,F-HD   diclofenac Sodium  2 g Topical TID   gabapentin  100 mg Oral TID   heparin  5,000 Units Subcutaneous Q8H   heparin sodium (porcine)  3,000-6,000 Units  Intracatheter Once in dialysis   insulin aspart  0-6 Units Subcutaneous TID WC   irbesartan  150 mg Oral Daily   lidocaine  1 patch Transdermal Q24H   multivitamin  1 tablet Oral Daily   pantoprazole  40 mg Oral Daily   rosuvastatin  20 mg Oral QHS    Dialysis Orders:  [facility] MWF 4h  450/ 1.5  100kg  2/2 bath  RUA AVF   Heparin 6000 - rocaltrol 1.25 mcg po tiw - sensipar 30mg  po tiw  - last HD 3/18, post wt 101.9kg       Assessment/ Plan: Chest pain, atypical - in setting of HTN urgency. w/ hx of intermediate risk perfusion scan in feb 2023. No ischemic workup required per cardiology.   ESRD - on HD MWF. HD today per regular schedule.  HTN - non compliance with medications.  BP now in goal on carvedilol 25mg  BID and irbesartan 150mg  qd.  On olmesartan 20mg  at home.  Volume - Does not appear volume overload.  Net UF goal 3L today.  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 Phos elevated. Cont phoslo, sensipar and po vdra w/ HD tiw.  DM2- per pmd  Jen Mow, PA-C [facility] 09/13/2022,10:24 AM  LOS: 0 days

## 2022-09-13 NOTE — Progress Notes (Signed)
AVS printed and reviewed with patient. Medication changes and dosing reviewed and [facility] pharmacy notified of refills needed prior to discharge. Awaiting medication delivery at bedside. Opportunities provided for questions or concerns and follow-up appointment reviewed with patient with no questions remaining at this time. PIV removed and patient dressed. Cell-phone and clothing returned and patient awaiting medication delivery for discharge.

## 2022-09-13 NOTE — Discharge Instructions (Addendum)
Dear Corey Park,  Thank you for letting us participate in your care. You were hospitalized for Chest pain.  You underwent several labs and imaging that were all normal.  You are started on a new cholesterol medication called Crestor.  Please review your medication list printed for you at discharge and take all medications as prescribed.  You should follow-up with your PCP outpatient within 1 to 2 weeks after being discharged.   DOCTOR'S APPOINTMENT   Future Appointments  Date Time Provider [place]  09/20/2022  9:10 AM Shary Key, DO [facility]     Take care and be well!  [facility]  [place], [place] [address] [phone]

## 2022-09-13 NOTE — Progress Notes (Signed)
Patient refused to be weighed, stated it's too early.

## 2022-09-13 NOTE — Plan of Care (Signed)

## 2022-09-13 NOTE — Progress Notes (Signed)
  Patient refused CBG check this morning stated, "mum,  it's too early."

## 2022-09-13 NOTE — Progress Notes (Signed)
Received patient in bed ,alert and oriented x 4.Per patient's nurse refused cbg and vitals early a.m.  Access used:    Medicine given : Heparin   Duration of treatment: 3.5 hours  Fluid removed 3.5 LITER ,Achieved prescribed uf goal.  Hemodialysis comment/issue:None.  Hand off to the patient's nurse.

## 2022-09-13 NOTE — Progress Notes (Signed)
     Daily Progress Note Intern Pager: [phone]  Patient name: Corey Park Medical record number: [id number] Date of birth: 11-30-1970 Age: 52 y.o. Gender: male  Primary Care Provider: Leslie Dales, DO Consultants: Cardiology, nephrology Code Status: Full   Pt Overview and Major Events to Date:  3/19: Admitted to FMTS  Assessment and Plan: Corey Park is a 52 y.o. male presenting with chest pain. PMHx includes HFpEF, anemia, T2DM, ESRD (MWF), GERD, HLD, HTN, MI and Gi bleed.  * Chest pain Now resolved. Cardiology assessed patient and recommended medical management and will not pursue cath inpatient. -Cardiology has been consulted, appreciate their care of this patient -Lidocaine patch to tender chest wall area -Tylenol every 6 for mild pain -Fentanyl 25 mcg IV every 2 for severe pain  Hypertensive heart disease with CHF (congestive heart failure) ([place]) Normotensive this AM. On home regimen of Coreg 25mg  BID, Irebsartan 150mg  daily -Continue home regimen  ESRD on hemodialysis ([facility]) MWF schedule. Plan for HD today -Nephrology consulted, appreciate recs -Continue home meds: Phoslo TID, Cinacalcet MWF  DM (diabetes mellitus), type 2 (H[facility]) A1c 5.6, well controlled. BGL mildly elevated to 100s. Only received 1U SSI -vsSSI    FEN/GI: Heart healthy PPx: Heparin Dispo: Home pending clinical improvement  Subjective:  Patient assessed at bedside in HD, reports no concerns or complaints. Ready to go home today  Objective: Temp:  [97.4 F (36.3 C)-98.1 F (36.7 C)] 97.5 F (36.4 C) (03/20 0531) Pulse Rate:  [63-77] 63 (03/20 0531) Resp:  [7-19] 16 (03/19 1938) BP: (128-160)/(66-101) 134/75 (03/20 0531) SpO2:  [94 %-100 %] 98 % (03/20 0531) Physical Exam: General: Laying in bed in HD, eyes closed, NAD Cardiovascular: RRR without murmur Respiratory: CTAB. Normal WOB on RA Abdomen: Soft, non-tender, non-distended Extremities: No peripheral  edema  Laboratory: Most recent CBC Lab Results  Component Value Date   WBC 6.2 09/12/2022   HGB 13.6 09/12/2022   HCT 40.7 09/12/2022   MCV 93.6 09/12/2022   PLT 188 09/12/2022   Most recent BMP    Latest Ref Rng & Units 09/12/2022    5:52 AM  BMP  Glucose 70 - 99 mg/dL 88   BUN 6 - 20 mg/dL 40   Creatinine 0.61 - 1.24 mg/dL 10.51   Sodium 135 - 145 mmol/L 136   Potassium 3.5 - 5.1 mmol/L 4.5   Chloride 98 - 111 mmol/L 92   CO2 22 - 32 mmol/L 25   Calcium 8.9 - 10.3 mg/dL 8.5     Other pertinent labs: A1c: 5.6 Hep B surface antigen: non-reactive  Corey Maryland, MD 09/13/2022, 7:28 AM  PGY-1, [place] Intern pager: [phone], text pages welcome Secure chat group [place]

## 2022-09-13 NOTE — TOC Transition Note (Addendum)
Transition of Care [facility]) - CM/SW Discharge Note   Patient Details  Name: Corey Park MRN: [id number] Date of Birth: [birth year]  Transition of Care [facility]) CM/SW Contact:  Zenon Mayo, RN Phone Number: 09/13/2022, 12:46 PM   Clinical Narrative:    Patient is for dc today, he states he has transportation home  NCM looked into Silver Sneakers for him, he is not eligible for silver sneakers, NCM informed him of this information. Patient is now saying he needs cab voucher to get home. He has a Engineering geologist on file from last month. NCM will provide a taxi voucher .          Patient Goals and CMS Choice      Discharge Placement                         Discharge Plan and Services Additional resources added to the After Visit Summary for                                       Social Determinants of Health (SDOH) Interventions SDOH Screenings   Food Insecurity: No Food Insecurity (09/13/2022)  Housing: Low Risk  (09/13/2022)  Transportation Needs: No Transportation Needs (09/13/2022)  Utilities: Not At Risk (09/13/2022)  Depression (PHQ2-9): Low Risk  (08/10/2022)  Tobacco Use: Low Risk  (09/12/2022)     Readmission Risk Interventions     No data to display

## 2022-09-13 NOTE — Progress Notes (Signed)
D/C orders noted. Contacted [facility] to advise clinic of pt's d/c today and that pt should resume care on Friday.   Melven Sartorius Renal Navigator [phone]

## 2022-09-13 NOTE — TOC Progression Note (Addendum)
Transition of Care [facility]) - Progression Note    Patient Details  Name: Corey Park MRN: [id number] Date of Birth: 12/14/1970  [facility]) CM/SW Contact  Zenon Mayo, RN Phone Number: 09/13/2022, 12:07 PM  Clinical Narrative:    from home with son, chest pain, HD patient. Legally blind, he wants help getting connected to sliver sneakers. TOC following.        Expected Discharge Plan and Services         Expected Discharge Date: 09/13/22                                     Social Determinants of Health (SDOH) Interventions SDOH Screenings   Food Insecurity: No Food Insecurity (09/13/2022)  Housing: Low Risk  (09/13/2022)  Transportation Needs: No Transportation Needs (09/13/2022)  Utilities: Not At Risk (09/13/2022)  Depression (PHQ2-9): Low Risk  (08/10/2022)  Tobacco Use: Low Risk  (09/12/2022)    Readmission Risk Interventions     No data to display

## 2022-09-13 NOTE — Care Management Obs Status (Signed)
[facility] NOTIFICATION   Patient Details  Name: Corey Park MRN: [id number] Date of Birth: Mar 31, 1971   Medicare Observation Status Notification Given:  Yes    Zenon Mayo, RN 09/13/2022, 12:43 PM

## 2022-09-13 NOTE — Hospital Course (Addendum)
Corey Park is a 52 y.o. male presenting with chest pain. Differential for this patient's presentation of this includes STEMI, NSTEMI, angina, costochondritis, pneumonia, CHF exacerbation, fluid overload. His hospital course is outlined below:   Chest pain: Patient presented to the ED with persistent chest pain and headache that woke him from sleep. Received ASA and 1 nitro tablet in route to hospital without symptom improvement. Vitals stable and EKG consistent with prior EKG in October. Troponins in the ED 110 and 117, elevated approximately 20% above baseline of 95 (from October 2023).  Cardiology was consulted and recommended medical therapy with ASA, Coreg 25 mg twice daily, olmesartan 25 mg daily, and adding Crestor 20 mg daily.  His restratification labs noted an A1c of 5.3 and an LDL of 69.  Patient chest pain resolved spontaneously and BP remained well controlled. At discharge he was continued on all of his home medications and prescribed Crestor.  PCP follow-up: Recheck BMP outpatient Follow-up on Crestor compliance, recheck lipid panel in 3 months.

## 2022-09-13 NOTE — Progress Notes (Addendum)
Rounding Note    Patient Name: Snapper Decelles Date of Encounter: 09/13/2022  [facility] Cardiologist: Candee Furbish, MD   Subjective   Pt states he needs refills on all of her medications  Inpatient Medications    Scheduled Meds:  aspirin EC  81 mg Oral Daily   calcitRIOL  1.25 mcg Oral Q M,W,F-HD   calcium acetate  1,334 mg Oral TID with meals   carvedilol  25 mg Oral BID WC   Chlorhexidine Gluconate Cloth  6 each Topical Q0600   cinacalcet  30 mg Oral Q M,W,F-HD   diclofenac Sodium  2 g Topical TID   gabapentin  100 mg Oral TID   heparin  5,000 Units Subcutaneous Q8H   insulin aspart  0-6 Units Subcutaneous TID WC   irbesartan  150 mg Oral Daily   lidocaine  1 patch Transdermal Q24H   multivitamin  1 tablet Oral Daily   pantoprazole  40 mg Oral Daily   rosuvastatin  20 mg Oral QHS   Continuous Infusions:  PRN Meds: acetaminophen, fentaNYL (SUBLIMAZE) injection, mouth rinse   Vital Signs    Vitals:   09/13/22 0932 09/13/22 1000 09/13/22 1030 09/13/22 1100  BP: 117/71 108/67 105/83 104/74  Pulse: 74 78 85 83  Resp: (!) 0 19 19 18   Temp:      TempSrc:      SpO2: 95% 92% 95% 94%  Weight:      Height:        Intake/Output Summary (Last 24 hours) at 09/13/2022 1124 Last data filed at 09/13/2022 0740 Gross per 24 hour  Intake 840 ml  Output 200 ml  Net 640 ml      09/13/2022    8:06 AM 09/12/2022    3:02 AM 08/17/2022   10:03 AM  Last 3 Weights  Weight (lbs) 228 lb 9.9 oz 225 lb 225 lb  Weight (kg) 103.7 kg 102.059 kg 102.059 kg      Telemetry    Sinus rhythm with 80s-90s - Personally Reviewed  ECG    No new tracings - Personally Reviewed  Physical Exam   GEN: No acute distress.   Neck: No JVD Cardiac: RRR, no murmurs, rubs, or gallops.  Respiratory: Clear to auscultation bilaterally. GI: Soft, nontender, non-distended  MS: No edema; No deformity. Neuro:  Nonfocal  Psych: Normal affect  Pt seen in HD  Labs    High  Sensitivity Troponin:   Recent Labs  Lab 09/12/22 0330 09/12/22 0510 09/12/22 0924  TROPONINIHS 110* 117* 130*     Chemistry Recent Labs  Lab 09/12/22 0330 09/12/22 0552 09/13/22 0820  NA SPECIMEN HEMOLYZED. HEMOLYSIS MAY AFFECT INTEGRITY OF RESULTS. [place] HEMOLYSIS MAY AFFECT INTEGRITY OF RESULTS. 4.5 4.6  CL SPECIMEN HEMOLYZED. HEMOLYSIS MAY AFFECT INTEGRITY OF RESULTS. 92* 94*  CO2 SPECIMEN HEMOLYZED. HEMOLYSIS MAY AFFECT INTEGRITY OF RESULTS. 25 26  GLUCOSE SPECIMEN HEMOLYZED. HEMOLYSIS MAY AFFECT INTEGRITY OF RESULTS. 88 178*  BUN SPECIMEN HEMOLYZED. HEMOLYSIS MAY AFFECT INTEGRITY OF RESULTS. 84* 68*  CREATININE SPECIMEN HEMOLYZED. HEMOLYSIS MAY AFFECT INTEGRITY OF RESULTS. 10.51* 12.96*  CALCIUM SPECIMEN HEMOLYZED. HEMOLYSIS MAY AFFECT INTEGRITY OF RESULTS. 8.5* 8.6*  ALBUMIN  --   --  3.3*  GFRNONAA SPECIMEN HEMOLYZED. HEMOLYSIS MAY AFFECT INTEGRITY OF RESULTS. 5* 4*  GFRAA SPECIMEN HEMOLYZED. HEMOLYSIS MAY AFFECT INTEGRITY OF RESULTS.  --   --   ANIONGAP SPECIMEN HEMOLYZED. HEMOLYSIS MAY AFFECT INTEGRITY OF RESULTS. 19* 16*  Lipids  Recent Labs  Lab 09/12/22 0924  CHOL 113  TRIG 46  HDL 35*  LDLCALC 69  CHOLHDL 3.2    Hematology Recent Labs  Lab 09/12/22 0330 09/13/22 0820  WBC 6.2 5.8  RBC 4.35 4.50  HGB 13.6 13.8  HCT 40.7 41.3  MCV 93.6 91.8  MCH 31.3 30.7  MCHC 33.4 33.4  RDW 16.9* 16.8*  PLT 188 149*   Thyroid No results for input(s): "TSH", "FREET4" in the last 168 hours.  BNPNo results for input(s): "BNP", "PROBNP" in the last 168 hours.  DDimer No results for input(s): "DDIMER" in the last 168 hours.   Radiology    DG Chest Port 1 View  Result Date: 09/12/2022 CLINICAL DATA:  Chest pain. EXAM: PORTABLE CHEST 1 VIEW COMPARISON:  CTA chest 08/05/2022 FINDINGS: There is mild cardiomegaly with no evidence of CHF. The lungs are clear. No pleural effusion is seen. The mediastinum is normally outlined. There is  calcification of the transverse aorta. Thoracic cage is intact. There have been no significant interval changes. IMPRESSION: 1. Mild cardiomegaly with no acute radiographic chest findings. 2. Aortic atherosclerosis. Electronically Signed   By: Telford Nab M.D.   On: 09/12/2022 04:52    Cardiac Studies   NM stress myoview 08/17/22:      LV perfusion is abnormal. There is no evidence of ischemia. There is evidence of infarction. Defect 1: There is a medium defect with severe reduction in uptake present in the apical to basal inferior location(s) that is fixed. There is abnormal wall motion in the defect area. Consistent with infarction.   Left ventricular function is abnormal. Nuclear stress EF: 42 %. The left ventricular ejection fraction is moderately decreased (30-44%). End diastolic cavity size is moderately enlarged.   Findings are consistent with infarction. The study is intermediate risk.     Echo 04/17/22: 1. Left ventricular ejection fraction, by estimation, is 55%. The left  ventricle has normal function. The left ventricle demonstrates regional  wall motion abnormalities with basal inferior hypokinesis. There is mild  concentric left ventricular  hypertrophy. Left ventricular diastolic parameters are consistent with  Grade II diastolic dysfunction (pseudonormalization).   2. Right ventricular systolic function is normal. The right ventricular  size is normal. Tricuspid regurgitation signal is inadequate for assessing  PA pressure.   3. The mitral valve is normal in structure. Trivial mitral valve  regurgitation. No evidence of mitral stenosis.   4. The aortic valve is tricuspid. Aortic valve regurgitation is not  visualized. No aortic stenosis is present.   5. The inferior vena cava is normal in size with greater than 50%  respiratory variability, suggesting right atrial pressure of 3 mmHg.   Patient Profile     52 y.o. male with a hx of HTN, hypertensive cardiomyopathy, ESRD  on HD MWF, type 2 DM, systolic and diastolic hear failure (with recovered EF from 20-25% to 55% ), medication non-compliance (remote),  who is being seen for the evaluation of chest pain.  Assessment & Plan    Chest pain - felt to be atypical - reassuring recent nuclear stress test - mid-sternal CP that woke him from sleep - in the setting of hypertensive urgency with SBP > 260 at home - HS troponin 110 --> 117 --> 130 - no plans for ischemic evaluation this admission   Hypertension Hypertensive urgency - in the setting of mediation non-compliance - stopped all BP medications on Sunday, may have been out of medications - difficult  to take BID medications as his HD is in the evenings - consider switching coreg to CR formulation if insurance is amenable - BP much better controlled in HD   Chronic systolic and diastolic heart failure - LFEV 25% in 2019, improved on echo 03/2022 to 55% - nuclear stress test 07/2022 with EF 42% - GDMT coreg 25 mg BID, olmesartan 20 mg    ESRD on HD DM2 - per primary   Needs to be seen OP. Will arrange cardiology follow up.        For questions or updates, please contact [place] Please consult [email or web address] for contact info under        Signed, Ledora Bottcher, PA  09/13/2022, 11:24 AM    Personally seen and examined. Agree with above.  Saw him in dialysis.  He is feeling well.  No chest pain.  No significant shortness of breath.  Blood pressure is under much better control currently.  He did state that he needed refills on all of his medications.  He also remembered asked him that we could give him the long-acting carvedilol instead of the shorter acting and perhaps this would help him from a compliance standpoint.  Regular rate and rhythm lungs are clear Hemodialysis fistula noted.  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  Latest echocardiogram showed  ejection fraction of 55%.  I fear is that ejection fraction will decrease once again if blood pressure is not optimal  We will go ahead and sign off.  Please let us know if we can be of further assistance.  Candee Furbish, MD

## 2022-09-14 LAB — HEPATITIS B SURFACE ANTIBODY, QUANTITATIVE: Hep B S AB Quant (Post): 168.7 m[IU]/mL (ref >9.9)

## 2022-09-20 ENCOUNTER — Inpatient Hospital Stay: Payer: Self-pay | Admitting: Family Medicine

## 2022-09-27 ENCOUNTER — Ambulatory Visit: Payer: Medicare Other | Attending: Physician Assistant | Admitting: Physician Assistant

## 2022-09-27 NOTE — Progress Notes (Deleted)
  Cardiology Office Note:    Date:  09/27/2022  ID:  Corey Park, DOB [birth year], MRN [id number] [place] [facility] Cardiologist:  Candee Furbish, MD { Click to update primary MD,subspecialty MD or APP then REFRESH:1}     Patient Profile:   (HFrEF) heart failure with reduced ejection fraction  Hypertensive Heart Disease; EF improved to normal TTE 06/17/18: EF 25-30 TTE 12/2020: EF 40-45 TTE 04/17/2022: EF 55, Inferior HK, mild concentric LVH, GR 2 DD, normal RVSF, trivial MR, RAP 3 Chest pain  Myoview 08/17/22: no ischemia, inferior infarct, EF 42, intermediate risk Hx of Mod secondary pulmonary HTN  ESRD on hemodialysis Hypertension Diabetes mellitus  Blindness  Hx of GI bleed      History of Present Illness:   Corey Park is a 52 y.o. male who returns for post hospital f/u. He was last seen by Dr. Marlou Porch 04/11/21. He was admitted in Feb 2024 with chest pain and elevated Troponins. His symptoms were more c/w MSK pain (2/2 hiccups) and his Troponins were flat (not c/w ACS). OP nuc stress test was recommended. This demonstrated inf infarct pattern, no ischemia, EF 42. Dr. Marlou Porch felt that med Rx should be pursued unless he has worsening chest pain. Then, cardiac catheterization would need to be considered. He was readmitted 3/19-3/20 with chest pain in the setting of hypertensive urgency. His systolic BP was in the [id number] at home. He had been out of some of his BP meds. His BP meds were resumed and his BP improved. His hsTrops were slightly elevated w/o trend. He was seen by Cardiology. His elevated hsTrops were felt to be due to chronic myocardial injury from ESRD/dialysis. No further testing was recommended. ***  ROS ***    Studies Reviewed:    EKG:  ***  ***  Risk Assessment/Calculations:   {Does this patient have ATRIAL FIBRILLATION?[phone]} No BP recorded.  {Refresh Note OR Click here to enter BP  :1}***       Physical Exam:   VS:  There were no vitals taken  for this visit.   Wt Readings from Last 3 Encounters:  09/13/22 221 lb 1.9 oz (100.3 kg)  08/17/22 225 lb (102.1 kg)  08/10/22 225 lb (102.1 kg)    Physical Exam***    ASSESSMENT AND PLAN:   No problem-specific Assessment & Plan notes found for this encounter. ***    {Are you ordering a CV Procedure (e.g. stress test, cath, DCCV, TEE, etc)?   Press F2        :[id number]  Dispo:  No follow-ups on file. Signed, Richardson Dopp, PA-C

## 2022-09-29 ENCOUNTER — Telehealth: Payer: Self-pay | Admitting: Student

## 2022-09-29 NOTE — Telephone Encounter (Signed)
Contacted Corey Park to schedule their annual wellness visit. Appointment made for 10/05/2022.  Thank you,  [facility] Support [facility] Direct dial  [phone]

## 2022-10-04 NOTE — Progress Notes (Unsigned)
  I attempted to contact the patient for her scheduled Virtual Telephone Annual Wellness Visit. No answer. I left a detailed message on the patient's voicemail.   

## 2022-10-04 NOTE — Patient Instructions (Signed)
Health Maintenance, Male Adopting a healthy lifestyle and getting preventive care are important in promoting health and wellness. Ask your health care provider about: The right schedule for you to have regular tests and exams. Things you can do on your own to prevent diseases and keep yourself healthy. What should I know about diet, weight, and exercise? Eat a healthy diet  Eat a diet that includes plenty of vegetables, fruits, low-fat dairy products, and lean protein. Do not eat a lot of foods that are high in solid fats, added sugars, or sodium. Maintain a healthy weight Body mass index (BMI) is a measurement that can be used to identify possible weight problems. It estimates body fat based on height and weight. Your health care provider can help determine your BMI and help you achieve or maintain a healthy weight. Get regular exercise Get regular exercise. This is one of the most important things you can do for your health. Most adults should: Exercise for at least 150 minutes each week. The exercise should increase your heart rate and make you sweat (moderate-intensity exercise). Do strengthening exercises at least twice a week. This is in addition to the moderate-intensity exercise. Spend less time sitting. Even light physical activity can be beneficial. Watch cholesterol and blood lipids Have your blood tested for lipids and cholesterol at 52 years of age, then have this test every 5 years. You may need to have your cholesterol levels checked more often if: Your lipid or cholesterol levels are high. You are older than 52 years of age. You are at high risk for heart disease. What should I know about cancer screening? Many types of cancers can be detected early and may often be prevented. Depending on your health history and family history, you may need to have cancer screening at various ages. This may include screening for: Colorectal cancer. Prostate cancer. Skin cancer. Lung  cancer. What should I know about heart disease, diabetes, and high blood pressure? Blood pressure and heart disease High blood pressure causes heart disease and increases the risk of stroke. This is more likely to develop in people who have high blood pressure readings or are overweight. Talk with your health care provider about your target blood pressure readings. Have your blood pressure checked: Every 3-5 years if you are 18-39 years of age. Every year if you are 40 years old or older. If you are between the ages of 65 and 75 and are a current or former smoker, ask your health care provider if you should have a one-time screening for abdominal aortic aneurysm (AAA). Diabetes Have regular diabetes screenings. This checks your fasting blood sugar level. Have the screening done: Once every three years after age 45 if you are at a normal weight and have a low risk for diabetes. More often and at a younger age if you are overweight or have a high risk for diabetes. What should I know about preventing infection? Hepatitis B If you have a higher risk for hepatitis B, you should be screened for this virus. Talk with your health care provider to find out if you are at risk for hepatitis B infection. Hepatitis C Blood testing is recommended for: Everyone born from 1945 through 1965. Anyone with known risk factors for hepatitis C. Sexually transmitted infections (STIs) You should be screened each year for STIs, including gonorrhea and chlamydia, if: You are sexually active and are younger than 52 years of age. You are older than 52 years of age and your   health care provider tells you that you are at risk for this type of infection. Your sexual activity has changed since you were last screened, and you are at increased risk for chlamydia or gonorrhea. Ask your health care provider if you are at risk. Ask your health care provider about whether you are at high risk for HIV. Your health care provider  may recommend a prescription medicine to help prevent HIV infection. If you choose to take medicine to prevent HIV, you should first get tested for HIV. You should then be tested every 3 months for as long as you are taking the medicine. Follow these instructions at home: Alcohol use Do not drink alcohol if your health care provider tells you not to drink. If you drink alcohol: Limit how much you have to 0-2 drinks a day. Know how much alcohol is in your drink. In the [place] one drink equals one 12 oz bottle of beer (355 mL), one 5 oz glass of wine (148 mL), or one 1 oz glass of hard liquor (44 mL). Lifestyle Do not use any products that contain nicotine or tobacco. These products include cigarettes, chewing tobacco, and vaping devices, such as e-cigarettes. If you need help quitting, ask your health care provider. Do not use street drugs. Do not share needles. Ask your health care provider for help if you need support or information about quitting drugs. General instructions Schedule regular health, dental, and eye exams. Stay current with your vaccines. Tell your health care provider if: You often feel depressed. You have ever been abused or do not feel safe at home. Summary Adopting a healthy lifestyle and getting preventive care are important in promoting health and wellness. Follow your health care provider's instructions about healthy diet, exercising, and getting tested or screened for diseases. Follow your health care provider's instructions on monitoring your cholesterol and blood pressure. This information is not intended to replace advice given to you by your health care provider. Make sure you discuss any questions you have with your health care provider. Document Revised: 11/01/2020 Document Reviewed: 11/01/2020 Elsevier Patient Education  2023 [employer].  

## 2022-10-05 ENCOUNTER — Ambulatory Visit (INDEPENDENT_AMBULATORY_CARE_PROVIDER_SITE_OTHER): Payer: Medicare Other

## 2022-10-05 DIAGNOSIS — Z0000 Encounter for general adult medical examination without abnormal findings: Secondary | ICD-10-CM

## 2022-10-18 ENCOUNTER — Encounter (HOSPITAL_COMMUNITY): Payer: Self-pay

## 2022-10-18 ENCOUNTER — Emergency Department (HOSPITAL_COMMUNITY)
Admission: EM | Admit: 2022-10-18 | Discharge: 2022-10-18 | Disposition: A | Discharge status: Home / Self Care | Payer: Medicare Other | Attending: Emergency Medicine | Admitting: Emergency Medicine

## 2022-10-18 ENCOUNTER — Emergency Department (HOSPITAL_COMMUNITY): Payer: Medicare Other

## 2022-10-18 DIAGNOSIS — Z992 Dependence on renal dialysis: Secondary | ICD-10-CM | POA: Insufficient documentation

## 2022-10-18 DIAGNOSIS — R079 Chest pain, unspecified: Secondary | ICD-10-CM | POA: Diagnosis not present

## 2022-10-18 DIAGNOSIS — I509 Heart failure, unspecified: Secondary | ICD-10-CM | POA: Diagnosis not present

## 2022-10-18 DIAGNOSIS — I132 Hypertensive heart and chronic kidney disease with heart failure and with stage 5 chronic kidney disease, or end stage renal disease: Secondary | ICD-10-CM | POA: Diagnosis not present

## 2022-10-18 DIAGNOSIS — I2510 Atherosclerotic heart disease of native coronary artery without angina pectoris: Secondary | ICD-10-CM | POA: Diagnosis not present

## 2022-10-18 DIAGNOSIS — S0993XA Unspecified injury of face, initial encounter: Secondary | ICD-10-CM | POA: Diagnosis present

## 2022-10-18 DIAGNOSIS — S0083XA Contusion of other part of head, initial encounter: Secondary | ICD-10-CM | POA: Insufficient documentation

## 2022-10-18 DIAGNOSIS — W1830XA Fall on same level, unspecified, initial encounter: Secondary | ICD-10-CM | POA: Diagnosis not present

## 2022-10-18 DIAGNOSIS — R7989 Other specified abnormal findings of blood chemistry: Secondary | ICD-10-CM | POA: Insufficient documentation

## 2022-10-18 DIAGNOSIS — E1122 Type 2 diabetes mellitus with diabetic chronic kidney disease: Secondary | ICD-10-CM | POA: Diagnosis not present

## 2022-10-18 DIAGNOSIS — N186 End stage renal disease: Secondary | ICD-10-CM | POA: Diagnosis not present

## 2022-10-18 DIAGNOSIS — Z79899 Other long term (current) drug therapy: Secondary | ICD-10-CM | POA: Diagnosis not present

## 2022-10-18 LAB — CBC
HCT: 35.4 % — ABNORMAL LOW (ref 39.0–52.0)
Hemoglobin: 11.9 g/dL — ABNORMAL LOW (ref 13.0–17.0)
MCH: 30.2 pg (ref 26.0–34.0)
MCHC: 33.6 g/dL (ref 30.0–36.0)
MCV: 89.8 fL (ref 80.0–100.0)
Platelets: 144 10*3/uL — ABNORMAL LOW (ref 150–400)
RBC: 3.94 MIL/uL — ABNORMAL LOW (ref 4.22–5.81)
RDW: 15.6 % — ABNORMAL HIGH (ref 11.5–15.5)
WBC: 8.5 10*3/uL (ref 4.0–10.5)
nRBC: 0 % (ref 0.0–0.2)

## 2022-10-18 LAB — BASIC METABOLIC PANEL
Anion gap: 19 — ABNORMAL HIGH (ref 5–15)
BUN: 61 mg/dL — ABNORMAL HIGH (ref 6–20)
CO2: 23 mmol/L (ref 22–32)
Calcium: 8.9 mg/dL (ref 8.9–10.3)
Chloride: 91 mmol/L — ABNORMAL LOW (ref 98–111)
Creatinine, Ser: 13.6 mg/dL — ABNORMAL HIGH (ref 0.61–1.24)
GFR, Estimated: 4 mL/min — ABNORMAL LOW (ref >60)
Glucose, Bld: 216 mg/dL — ABNORMAL HIGH (ref 70–99)
Potassium: 4.7 mmol/L (ref 3.5–5.1)
Sodium: 133 mmol/L — ABNORMAL LOW (ref 135–145)

## 2022-10-18 LAB — BRAIN NATRIURETIC PEPTIDE: B Natriuretic Peptide: 163.7 pg/mL — ABNORMAL HIGH (ref 0.0–100.0)

## 2022-10-18 LAB — TROPONIN I (HIGH SENSITIVITY)
Troponin I (High Sensitivity): 85 ng/L — ABNORMAL HIGH (ref <18)
Troponin I (High Sensitivity): 80 ng/L — ABNORMAL HIGH (ref <18)

## 2022-10-18 MED ORDER — IOHEXOL 350 MG/ML SOLN
75.0000 mL | Freq: Once | INTRAVENOUS | Status: AC | PRN
  Administered 2022-10-18: 75 mL via INTRAVENOUS

## 2022-10-18 MED ORDER — BACITRACIN ZINC 500 UNIT/GM EX OINT
TOPICAL_OINTMENT | Freq: Two times a day (BID) | CUTANEOUS | Status: DC
  Administered 2022-10-18: 1 via TOPICAL
  Filled 2022-10-18: qty 0.9

## 2022-10-18 MED ORDER — BACITRACIN ZINC 500 UNIT/GM EX OINT: 1.0000 | TOPICAL_OINTMENT | Freq: Two times a day (BID) | CUTANEOUS | 0 refills | Status: DC

## 2022-10-18 MED ORDER — FENTANYL CITRATE PF 50 MCG/ML IJ SOSY
50.0000 ug | PREFILLED_SYRINGE | Freq: Once | INTRAMUSCULAR | Status: AC
  Administered 2022-10-18: 50 ug via INTRAVENOUS
  Filled 2022-10-18: qty 1

## 2022-10-18 NOTE — ED Triage Notes (Signed)
Patient BIB EMS from [facility] with a c/o chest pain. Patient states pain is 10/10 and is sharp and stabbing. Patient received 324 aspirin and 1 nitro without relief. Patient M/W/F dialysis pt and is compliant. Patient has left sided left injury from a fall yesterday. Patient A&Ox4.

## 2022-10-18 NOTE — Discharge Instructions (Signed)
You were seen in the emergency department for some sharp left-sided chest pain.  You had blood work EKG chest x-ray and a CAT scan of your chest that did not show a definite cause of your symptoms.  Please continue your regular medications and have your dialysis today.  Follow-up with your treatment team.  Return to the emergency department if any worsening or concerning symptoms

## 2022-10-18 NOTE — ED Notes (Signed)
When asking patient how he would get home/to dialysis patient states he will either call and uber or cab and they always help him walk to specific destination.

## 2022-10-18 NOTE — ED Notes (Signed)
[facility] called for patient.

## 2022-10-18 NOTE — ED Notes (Signed)
Patient in CT

## 2022-10-18 NOTE — ED Provider Notes (Signed)
EMERGENCY DEPARTMENT AT [facility] Provider Note   CSN: [id number] Arrival date & time: 10/18/22  0825     History  Chief Complaint  Patient presents with   Chest Pain    Corey Park is a 52 y.o. male.  He has a history of diabetes, end-stage renal disease with dialysis Monday Wednesday Friday, cardiac disease and CHF.  He is presenting by ambulance from work after complaint of left-sided sharp stabbing chest pain.  Symptoms started around 730 this morning.  He has had this before and was admitted last month.  He also said he has been more short of breath, required oxygen during last dialysis Monday.  Yesterday had a syncopal event fell struck his face has a swollen lip.  He is dialysis Monday Wednesday Friday has not missed any treatments recently.  He still does endorse some shortness of breath.  He also endorses continued hiccups it has been going on for 5 years with no etiology identified.  He was given aspirin 324 and 1 nitro by EMS without any change in his symptoms  The history is provided by the patient and the EMS personnel.  Chest Pain Pain location:  L chest Pain quality: stabbing   Pain radiates to:  Does not radiate Pain severity:  Moderate Onset quality:  Sudden Duration:  1 hour Timing:  Intermittent Progression:  Unchanged Chronicity:  Recurrent Relieved by:  None tried Worsened by:  Nothing Ineffective treatments:  None tried Associated symptoms: shortness of breath and syncope   Associated symptoms: no abdominal pain, no cough, no diaphoresis, no fever, no nausea and no vomiting   Risk factors: coronary artery disease, diabetes mellitus, high cholesterol and hypertension   Risk factors: no smoking        Home Medications Prior to Admission medications   Medication Sig Start Date End Date Taking? Authorizing Provider  carvedilol (COREG) 25 MG tablet Take 1 tablet (25 mg total) by mouth 2 (two) times daily. 09/13/22 10/13/22  Jerre Simon, MD  diclofenac Sodium (VOLTAREN) 1 % GEL Apply 2 g topically in the morning, at noon, and at bedtime. 09/13/22   Jerre Simon, MD  gabapentin (NEURONTIN) 100 MG capsule Take 1 capsule (100 mg total) by mouth 3 (three) times daily. 09/13/22 10/13/22  Jerre Simon, MD  lidocaine (LIDODERM) 5 % Place 1 patch onto the skin daily. Remove & Discard patch within 12 hours or as directed by MD 09/13/22   Jerre Simon, MD  olmesartan (BENICAR) 20 MG tablet Take 1 tablet (20 mg total) by mouth daily. 09/13/22 10/13/22  Jerre Simon, MD  pantoprazole (PROTONIX) 40 MG tablet Take 1 tablet (40 mg total) by mouth daily. 09/13/22 10/13/22  Jerre Simon, MD  rosuvastatin (CRESTOR) 10 MG tablet Take 1 tablet (10 mg total) by mouth at bedtime. 09/13/22   Jerre Simon, MD      Allergies    Patient has no known allergies.    Review of Systems   Review of Systems  Constitutional:  Negative for diaphoresis and fever.  HENT:  Positive for facial swelling.   Eyes:  Positive for visual disturbance (Blind).  Respiratory:  Positive for shortness of breath. Negative for cough.   Cardiovascular:  Positive for chest pain and syncope.  Gastrointestinal:  Negative for abdominal pain, nausea and vomiting.    Physical Exam Updated Vital Signs BP 134/81 (BP Location: Left Arm)   Pulse 76   Temp 98.1 F (36.7 C) (Oral)   Resp [name])  23   Ht 6\' 1"  (1.854 m)   Wt 99.8 kg   SpO2 99%   BMI 29.03 kg/m  Physical Exam Vitals and nursing note reviewed.  Constitutional:      General: He is not in acute distress.    Appearance: He is well-developed.  HENT:     Head: Normocephalic.     Comments: He has swollen left upper lip with some macerated tissue. Eyes:     Conjunctiva/sclera: Conjunctivae normal.  Cardiovascular:     Rate and Rhythm: Normal rate and regular rhythm.     Heart sounds: Normal heart sounds. No murmur heard. Pulmonary:     Effort: Pulmonary effort is normal. No respiratory distress.     Breath  sounds: Normal breath sounds.  Abdominal:     Palpations: Abdomen is soft.     Tenderness: There is no abdominal tenderness.  Musculoskeletal:        General: No swelling. Normal range of motion.     Cervical back: Neck supple.     Right lower leg: No tenderness.     Left lower leg: No tenderness.  Skin:    General: Skin is warm and dry.     Capillary Refill: Capillary refill takes less than 2 seconds.  Neurological:     General: No focal deficit present.     Mental Status: He is alert.     ED Results / Procedures / Treatments   Labs (all labs ordered are listed, but only abnormal results are displayed) Labs Reviewed  BASIC METABOLIC PANEL - Abnormal; Notable for the following components:      Result Value   Sodium 133 (*)    Chloride 91 (*)    Glucose, Bld 216 (*)    BUN 61 (*)    Creatinine, Ser 13.60 (*)    GFR, Estimated 4 (*)    Anion gap 19 (*)    All other components within normal limits  CBC - Abnormal; Notable for the following components:   RBC 3.94 (*)    Hemoglobin 11.9 (*)    HCT 35.4 (*)    RDW 15.6 (*)    Platelets 144 (*)    All other components within normal limits  BRAIN NATRIURETIC PEPTIDE - Abnormal; Notable for the following components:   B Natriuretic Peptide 163.7 (*)    All other components within normal limits  TROPONIN I (HIGH SENSITIVITY) - Abnormal; Notable for the following components:   Troponin I (High Sensitivity) 85 (*)    All other components within normal limits  TROPONIN I (HIGH SENSITIVITY) - Abnormal; Notable for the following components:   Troponin I (High Sensitivity) 80 (*)    All other components within normal limits    EKG EKG Interpretation  Date/Time:  Wednesday October 18 2022 16:10:96 EDT Ventricular Rate:  74 PR Interval:  185 QRS Duration: 104 QT Interval:  417 QTC Calculation: 463 R Axis:   -38 Text Interpretation: Sinus rhythm LVH with secondary repolarization abnormality No significant change since prior  3/24 Confirmed by Meridee Score [phone]) on 10/18/2022 8:39:44 AM  Radiology CT Angio Chest PE W/Cm &/Or Wo Cm  Result Date: 10/18/2022 CLINICAL DATA:  Chest pain. EXAM: CT ANGIOGRAPHY CHEST WITH CONTRAST TECHNIQUE: Multidetector CT imaging of the chest was performed using the standard protocol during bolus administration of intravenous contrast. Multiplanar CT image reconstructions and MIPs were obtained to evaluate the vascular anatomy. RADIATION DOSE REDUCTION: This exam was performed according to the departmental dose-optimization program  which includes automated exposure control, adjustment of the mA and/or kV according to patient size and/or use of iterative reconstruction technique. CONTRAST:  75mL OMNIPAQUE IOHEXOL 350 MG/ML SOLN COMPARISON:  August 05, 2022. FINDINGS: Cardiovascular: Satisfactory opacification of the pulmonary arteries to the segmental level. No evidence of pulmonary embolism. Mild cardiomegaly. Extensive coronary artery calcifications are noted. No pericardial effusion. Mediastinum/Nodes: No enlarged mediastinal, hilar, or axillary lymph nodes. Thyroid gland, trachea, and esophagus demonstrate no significant findings. Lungs/Pleura: No pneumothorax or pleural effusion is noted. Minimal bilateral posterior basilar subsegmental atelectasis. Upper Abdomen: No acute abnormality. Musculoskeletal: No chest wall abnormality. No acute or significant osseous findings. Review of the MIP images confirms the above findings. IMPRESSION: No definite evidence of pulmonary embolus. Minimal bilateral posterior basilar subsegmental atelectasis. Extensive coronary artery calcifications are noted suggesting coronary artery disease. Aortic Atherosclerosis (ICD10-I70.0). Electronically Signed   By: Lupita Raider M.D.   On: 10/18/2022 13:19   DG Chest Port 1 View  Result Date: 10/18/2022 CLINICAL DATA:  Chest pain. EXAM: PORTABLE CHEST 1 VIEW COMPARISON:  09/12/2022. FINDINGS: Low lung volumes  accentuate the pulmonary vasculature and cardiomediastinal silhouette. No consolidation or pulmonary edema. No pleural effusion or pneumothorax. Visualized bones and upper abdomen are unremarkable. IMPRESSION: No evidence of acute cardiopulmonary disease. Electronically Signed   By: Orvan Falconer M.D.   On: 10/18/2022 08:59    Procedures Procedures    Medications Ordered in ED Medications  fentaNYL (SUBLIMAZE) injection 50 mcg (has no administration in time range)    ED Course/ Medical Decision Making/ A&P Clinical Course as of 10/18/22 1652  Wed Oct 18, 2022  0900 Chest x-ray interpreted by me as cardiomegaly no gross infiltrates.  Awaiting radiology reading. [MB]  1213 Patient states he is continuing to have left-sided chest pain.  He is also requiring oxygen while he is sleeping dipping into the high 80s.  Have put in for chest CT. [MB]  1349 Patient states he will take an Benedetto Goad to dialysis and go home from there.  He wants some ointment for his lip. [MB]  1439 Patient ambulated in the department sats stayed 90 to 93%. [MB]    Clinical Course User Index [MB] Terrilee Files, MD                             Medical Decision Making Amount and/or Complexity of Data Reviewed Labs: ordered. Radiology: ordered.  Risk OTC drugs. Prescription drug management.   This patient complains of sharp chest pain, syncope, facial injury; this involves an extensive number of treatment Options and is a complaint that carries with it a high risk of complications and morbidity. The differential includes arrhythmia, ACS, pneumonia, PE, pneumothorax, vascular  I ordered, reviewed and interpreted labs, which included CBC with normal white count stable hemoglobin, chemistries consistent with CKD, troponins elevated but flat, BNP elevated but better than priors I ordered medication IV pain medication and reviewed PMP when indicated. I ordered imaging studies which included chest x-ray, CT angio  chest and I independently    visualized and interpreted imaging which showed no acute findings, does have atelectasis and signs of coronary calcifications Additional history obtained from EMS Previous records obtained and reviewed in epic, was admitted last month for ACS and no cardiac interventions were performed Cardiac monitoring reviewed, normal sinus rhythm Social determinants considered, no significant barriers Critical Interventions: None  After the interventions stated above, I reevaluated the patient and found  patient's pain to be improved Admission and further testing considered, no indications for admission or further workup at this time.  Recommended patient follow-up with PCP and nephrology team.  Return instructions discussed.         Final Clinical Impression(s) / ED Diagnoses Final diagnoses:  Nonspecific chest pain  Facial contusion, initial encounter    Rx / DC Orders ED Discharge Orders     None         Terrilee Files, MD 10/18/22 1654

## 2022-10-18 NOTE — ED Notes (Signed)
Patient O2 sat remained 94-95 when ambulating.

## 2022-10-19 ENCOUNTER — Encounter (HOSPITAL_COMMUNITY): Payer: Self-pay | Admitting: Emergency Medicine

## 2022-10-19 ENCOUNTER — Other Ambulatory Visit: Payer: Self-pay

## 2022-10-19 ENCOUNTER — Emergency Department (HOSPITAL_COMMUNITY): Payer: Medicare Other

## 2022-10-19 ENCOUNTER — Observation Stay (HOSPITAL_COMMUNITY)
Admission: EM | Admit: 2022-10-19 | Discharge: 2022-10-20 | Disposition: A | Discharge status: Home / Self Care | Payer: Medicare Other | Attending: Family Medicine | Admitting: Family Medicine

## 2022-10-19 DIAGNOSIS — Z992 Dependence on renal dialysis: Secondary | ICD-10-CM | POA: Diagnosis not present

## 2022-10-19 DIAGNOSIS — R55 Syncope and collapse: Secondary | ICD-10-CM | POA: Diagnosis not present

## 2022-10-19 DIAGNOSIS — Z79899 Other long term (current) drug therapy: Secondary | ICD-10-CM | POA: Insufficient documentation

## 2022-10-19 DIAGNOSIS — I5030 Unspecified diastolic (congestive) heart failure: Secondary | ICD-10-CM | POA: Diagnosis not present

## 2022-10-19 DIAGNOSIS — E1122 Type 2 diabetes mellitus with diabetic chronic kidney disease: Secondary | ICD-10-CM | POA: Diagnosis not present

## 2022-10-19 DIAGNOSIS — I132 Hypertensive heart and chronic kidney disease with heart failure and with stage 5 chronic kidney disease, or end stage renal disease: Secondary | ICD-10-CM | POA: Insufficient documentation

## 2022-10-19 DIAGNOSIS — N186 End stage renal disease: Secondary | ICD-10-CM | POA: Diagnosis not present

## 2022-10-19 DIAGNOSIS — R079 Chest pain, unspecified: Secondary | ICD-10-CM | POA: Diagnosis present

## 2022-10-19 DIAGNOSIS — Z7982 Long term (current) use of aspirin: Secondary | ICD-10-CM | POA: Insufficient documentation

## 2022-10-19 LAB — BASIC METABOLIC PANEL
Anion gap: 15 (ref 5–15)
BUN: 41 mg/dL — ABNORMAL HIGH (ref 6–20)
CO2: 29 mmol/L (ref 22–32)
Calcium: 9.2 mg/dL (ref 8.9–10.3)
Chloride: 88 mmol/L — ABNORMAL LOW (ref 98–111)
Creatinine, Ser: 11.31 mg/dL — ABNORMAL HIGH (ref 0.61–1.24)
GFR, Estimated: 5 mL/min — ABNORMAL LOW (ref >60)
Glucose, Bld: 106 mg/dL — ABNORMAL HIGH (ref 70–99)
Potassium: 4.6 mmol/L (ref 3.5–5.1)
Sodium: 132 mmol/L — ABNORMAL LOW (ref 135–145)

## 2022-10-19 LAB — CBC
HCT: 38.2 % — ABNORMAL LOW (ref 39.0–52.0)
Hemoglobin: 13.1 g/dL (ref 13.0–17.0)
MCH: 30.7 pg (ref 26.0–34.0)
MCHC: 34.3 g/dL (ref 30.0–36.0)
MCV: 89.5 fL (ref 80.0–100.0)
Platelets: 160 10*3/uL (ref 150–400)
RBC: 4.27 MIL/uL (ref 4.22–5.81)
RDW: 15.4 % (ref 11.5–15.5)
WBC: 7.6 10*3/uL (ref 4.0–10.5)
nRBC: 0 % (ref 0.0–0.2)

## 2022-10-19 LAB — MRSA NEXT GEN BY PCR, NASAL: MRSA by PCR Next Gen: NOT DETECTED

## 2022-10-19 LAB — TROPONIN I (HIGH SENSITIVITY)
Troponin I (High Sensitivity): 86 ng/L — ABNORMAL HIGH (ref <18)
Troponin I (High Sensitivity): 76 ng/L — ABNORMAL HIGH (ref <18)

## 2022-10-19 LAB — GLUCOSE, CAPILLARY: Glucose-Capillary: 103 mg/dL — ABNORMAL HIGH (ref 70–99)

## 2022-10-19 MED ORDER — GABAPENTIN 100 MG PO CAPS: 100.0000 mg | ORAL_CAPSULE | Freq: Two times a day (BID) | ORAL | Status: DC | PRN

## 2022-10-19 MED ORDER — IRBESARTAN 75 MG PO TABS: 37.5000 mg | ORAL_TABLET | Freq: Every day | ORAL | Status: DC

## 2022-10-19 MED ORDER — DICLOFENAC SODIUM 1 % EX GEL: 2.0000 g | Freq: Four times a day (QID) | CUTANEOUS | Status: DC | PRN

## 2022-10-19 MED ORDER — GABAPENTIN 100 MG PO CAPS
100.0000 mg | ORAL_CAPSULE | Freq: Once | ORAL | Status: AC
  Administered 2022-10-19: 100 mg via ORAL
  Filled 2022-10-19: qty 1

## 2022-10-19 MED ORDER — PANTOPRAZOLE SODIUM 40 MG PO TBEC
40.0000 mg | DELAYED_RELEASE_TABLET | Freq: Every day | ORAL | Status: DC
  Administered 2022-10-20: 40 mg via ORAL
  Filled 2022-10-19: qty 1

## 2022-10-19 MED ORDER — SODIUM CHLORIDE 0.9 % IV BOLUS
500.0000 mL | Freq: Once | INTRAVENOUS | Status: AC
  Administered 2022-10-19: 500 mL via INTRAVENOUS

## 2022-10-19 MED ORDER — ASPIRIN 81 MG PO TBEC
81.0000 mg | DELAYED_RELEASE_TABLET | Freq: Every day | ORAL | Status: DC
  Administered 2022-10-19 – 2022-10-20 (×2): 81 mg via ORAL
  Filled 2022-10-19 (×2): qty 1

## 2022-10-19 MED ORDER — GABAPENTIN 100 MG PO CAPS
100.0000 mg | ORAL_CAPSULE | Freq: Three times a day (TID) | ORAL | Status: DC
  Administered 2022-10-19 – 2022-10-20 (×2): 100 mg via ORAL
  Filled 2022-10-19 (×2): qty 1

## 2022-10-19 MED ORDER — HEPARIN SODIUM (PORCINE) 5000 UNIT/ML IJ SOLN
5000.0000 [IU] | Freq: Three times a day (TID) | INTRAMUSCULAR | Status: DC
  Administered 2022-10-20: 5000 [IU] via SUBCUTANEOUS
  Filled 2022-10-19: qty 1

## 2022-10-19 MED ORDER — IRBESARTAN 75 MG PO TABS
37.5000 mg | ORAL_TABLET | Freq: Every day | ORAL | Status: DC
  Administered 2022-10-20: 37.5 mg via ORAL
  Filled 2022-10-19 (×2): qty 0.5

## 2022-10-19 MED ORDER — PANTOPRAZOLE SODIUM 40 MG PO TBEC: 40.0000 mg | DELAYED_RELEASE_TABLET | Freq: Every day | ORAL | Status: DC

## 2022-10-19 MED ORDER — GABAPENTIN 100 MG PO CAPS: 100.0000 mg | ORAL_CAPSULE | ORAL | Status: DC

## 2022-10-19 MED ORDER — ASPIRIN 81 MG PO TBEC: 81.0000 mg | DELAYED_RELEASE_TABLET | Freq: Every day | ORAL | Status: DC

## 2022-10-19 MED ORDER — CALCIUM ACETATE (PHOS BINDER) 667 MG PO CAPS
1334.0000 mg | ORAL_CAPSULE | Freq: Three times a day (TID) | ORAL | Status: DC
  Administered 2022-10-19 – 2022-10-20 (×2): 1334 mg via ORAL
  Filled 2022-10-19 (×3): qty 2

## 2022-10-19 MED ORDER — RENA-VITE PO TABS
1.0000 | ORAL_TABLET | Freq: Every day | ORAL | Status: DC
  Filled 2022-10-19: qty 1

## 2022-10-19 MED ORDER — ASPIRIN 81 MG PO CHEW
324.0000 mg | CHEWABLE_TABLET | Freq: Once | ORAL | Status: AC
  Administered 2022-10-19: 324 mg via ORAL
  Filled 2022-10-19: qty 4

## 2022-10-19 MED ORDER — CARVEDILOL 25 MG PO TABS: 25.0000 mg | ORAL_TABLET | Freq: Two times a day (BID) | ORAL | Status: DC

## 2022-10-19 MED ORDER — ROSUVASTATIN CALCIUM 5 MG PO TABS: 10.0000 mg | ORAL_TABLET | Freq: Every day | ORAL | Status: DC

## 2022-10-19 MED ORDER — ADULT MULTIVITAMIN W/MINERALS CH: 1.0000 | ORAL_TABLET | Freq: Every day | ORAL | Status: DC

## 2022-10-19 NOTE — Assessment & Plan Note (Addendum)
Patient has declined PT evaluation this morning.  He would like to go home after dialysis. He feels he would not become dizzy if he gets up slowly. Reports he is safe at home. He is alert and oriented x 4, no overt focal neurologic deficits on exam today. -F/u orthostatics, consider reducing olmesartan

## 2022-10-19 NOTE — Hospital Course (Addendum)
Corey Park is a 52 y.o male with history of T2DM, ESRD on HD MWF W/fistula to the R arm, HTN, HFpEF, GERD, and HLD. He is legally blind. Admitted for syncopal work-up and dialysis.   Syncope and collapse Reported a syncopal episode on 10/17/2022 in the context of sitting out in the hot sun.  Chronic poor p.o. intake.  EKG was nonconcerning for arrhythmia, low suspicion of head trauma, he remained neurologically intact without focal neurologic deficits. Suspect, orthostatic hypotension in setting of vasodilation and chronic dialysis with poor p.o. intake. His primary reason for seeking medical care was his concern he is unable to care for himself at home. On 10/20/2022 he requested to be discharged after dialysis. No dizziness or syncopal event during hospitalization.  He reports he is safe at home.  Additionally, patient declined PT/OT evaluation.  He was discharged home in stable condition.  Chest pain Acute on chronic chest pain exacerbation in setting of chronic hiccups.  Troponins trended flat.  EKG was unchanged from prior.  Low suspicion of ACS.  Please see follow-up recommendations with regard to his hiccups.  Chest pain resolved at time of discharge.  ESRD Due to hospital dialysis clinic volume, patient did not receive dialysis in the hospital on 10/20/2022.  He instead received cab voucher for transport from hospital to outpatient dialysis for 4/26, with voucher for transport from dialysis back to home.  Other chronic conditions were medically managed with home medications and formulary alternatives as necessary (ESRD on HD, T2DM, HTN, GERD, HLD)  Follow-up recommendations Suspect orthostatic hypotension. Consider decreasing olemesartan.  Patient has chronic hiccups (5 years). Failed PPI, Gabapentin and Thorazine. Could consider haldol OP. GI referral was sent in January and patient has appointment with [facility] on 11/30/2022. We recommend he keeps this appointment. Personal care service  application was sent by [facility] in January 2024. Unfortunately does not appear patient nor [facility] have received a response. Recommend PCP follow-up on acquiring care.  Patient is transitioning to new PCP. [facility] will provide care for 30 days. New PCP appointment on 10/30/2022 with Ricky Stabs NP.

## 2022-10-19 NOTE — ED Provider Triage Note (Signed)
Emergency Medicine Provider Triage Evaluation Note  Corey Park , a 52 y.o. male  was evaluated in triage.  Pt complains of persistent chest pain. Similar symptoms yesterday and was seen in ER, went home, woke up, and pain returned. Feels more like stabbing and throbbing between his ribs. Had syncopal episode the other day, hit his face on his knee, cutting his lip.   Review of Systems  Positive: Chest pain Negative:   Physical Exam  Pulse 74   Temp 99 F (37.2 C) (Oral)   Resp 18   SpO2 100%  Gen:   Awake, no distress   Resp:  Normal effort  MSK:   Moves extremities without difficulty  Other:    Medical Decision Making  Medically screening exam initiated at 1:18 PM.  Appropriate orders placed.  Corey Park was informed that the remainder of the evaluation will be completed by another provider, this initial triage assessment does not replace that evaluation, and the importance of remaining in the ED until their evaluation is complete.  Workup initiated   Corey Park T, PA-C 10/19/22 1318

## 2022-10-19 NOTE — ED Triage Notes (Addendum)
Patient from home BIB [facility] w/ complaints of left sided chest pain that started at 11 am today. Patient tried to take double his carvedilol but no relief. Patient seen yesterday for same was discharged but patient states chest pain recurred. Patient had a syncopal fall two days ago and hit lip on knee and has swelling to the lip along with neck and back pain since the fall. AOX4. VSS. Patient is a dialysis patient. Received dialysis MWF, fistula to the R arm. Went to treatment yesterday without complications.

## 2022-10-19 NOTE — Assessment & Plan Note (Addendum)
Resolved

## 2022-10-19 NOTE — Progress Notes (Addendum)
FMTS Brief Progress Note  S: Patient who is legally blind was admitted earlier today for syncope with reported LOC.  He was laying in bed comfortably upon arrival for night rounds with Dr. Phineas Real.  Per patient he is doing fine at this time and still have some lightheadedness with standing.  Per nurse he was unable to complete the orthostatic vitals due to lightheadedness when standing.  Additionally his only other concern is heartburn which is a chronic issue with his history of GERD.   O: BP 132/67   Pulse 74   Temp 98.1 F (36.7 C) (Oral)   Resp 11   Ht  (1.854 m)   Wt 99.3 kg   SpO2 99%   BMI 28.88 kg/m    General: Alert, legally blind, persistent hiccups, NAD CV: RRR, no murmurs, normal S1/S2 Pulm: CTAB, good WOB on RA, no crackles or wheezing Abd: Soft, no distension, no tenderness Ext: No BLE edema   A/P: Syncope Suspect orthostatics given nature of presentation.  Also considering vasovagal in the differential.  The chronic nature of patient's symptoms would need evaluation by PT/OT to determine safe disposition on whether he would need to be in a long-term care facility. -Fall precautions -Consider retrying orthostatic vitals -PT/OT evaluations -Continue routine vitals -Follow-up morning a.m. labs  GERD Patient complained of heartburn and home medication includes Protonix 40 mg daily. -Restarted home Protonix  ESRD on HD MWF Nephrology consulted and patient likely to get HD tomorrow. -Appreciate nephro recs   Jerre Simon, MD 10/19/2022, 9:16 PM PGY-2, [facility] Night Resident  Please page [phone] with questions.

## 2022-10-19 NOTE — ED Provider Notes (Signed)
[place] EMERGENCY DEPARTMENT AT [facility] Provider Note   CSN: [id number] Arrival date & time: 10/19/22  1309     History  Chief Complaint  Patient presents with   Chest Pain    Corey Park is a 52 y.o. male.  HPI 52 year old male presents with chest pain.  He has multiple comorbidities which include CHF, end-stage renal disease on dialysis, hypertension, blindness, diabetes.  He is having chest pain today.  Started around 11:30 AM.  He states that he was feeling poorly 2 days ago.  He thinks this might of been related to the fluid pulled off on dialysis 3 days ago.  He was lightheaded and when he stood up he passed out and hit his lip on his knee.  Came in yesterday for chest pain and lip injury.  Troponins were flat yesterday and was discharged after workup which included CTA.  Today the chest pain has come back though now it is more of a throbbing type pain like someone is punching him rather than a sharp.  He also has his chronic hiccups.  He feels short of breath today.  He feels like his lip is more swollen and bleeding some.  He states when he passed out he was also feel like his heart was racing and palpitating.  He felt that yesterday as well and a little bit today.  Home Medications Prior to Admission medications   Medication Sig Start Date End Date Taking? Authorizing Provider  bacitracin ointment Apply 1 Application topically 2 (two) times daily. 10/18/22   Terrilee Files, MD  carvedilol (COREG) 25 MG tablet Take 1 tablet (25 mg total) by mouth 2 (two) times daily. 09/13/22 10/13/22  Jerre Simon, MD  diclofenac Sodium (VOLTAREN) 1 % GEL Apply 2 g topically in the morning, at noon, and at bedtime. 09/13/22   Jerre Simon, MD  gabapentin (NEURONTIN) 100 MG capsule Take 1 capsule (100 mg total) by mouth 3 (three) times daily. 09/13/22 10/13/22  Jerre Simon, MD  lidocaine (LIDODERM) 5 % Place 1 patch onto the skin daily. Remove & Discard patch within 12 hours  or as directed by MD 09/13/22   Jerre Simon, MD  olmesartan (BENICAR) 20 MG tablet Take 1 tablet (20 mg total) by mouth daily. 09/13/22 10/13/22  Jerre Simon, MD  pantoprazole (PROTONIX) 40 MG tablet Take 1 tablet (40 mg total) by mouth daily. 09/13/22 10/13/22  Jerre Simon, MD  rosuvastatin (CRESTOR) 10 MG tablet Take 1 tablet (10 mg total) by mouth at bedtime. 09/13/22   Jerre Simon, MD      Allergies    Patient has no known allergies.    Review of Systems   Review of Systems  HENT:  Positive for facial swelling.   Respiratory:  Positive for shortness of breath.   Cardiovascular:  Positive for chest pain and palpitations.  Gastrointestinal:  Positive for vomiting. Negative for abdominal pain.    Physical Exam Updated Vital Signs BP (!) 105/55   Pulse 78   Temp 99 F (37.2 C) (Oral)   Resp 18   Ht 6\' 1"  (1.854 m)   Wt 99 kg   SpO2 99%   BMI 28.80 kg/m  Physical Exam Vitals and nursing note reviewed.  Constitutional:      Appearance: He is well-developed.  HENT:     Head: Normocephalic.     Mouth/Throat:     Comments: Patient has firm swelling to the inferior lip. There appears to  be 2 puncture wounds on the inner portion. No active bleeding. Small puncture wound to inner upper lip. Cardiovascular:     Rate and Rhythm: Normal rate and regular rhythm.     Heart sounds: Normal heart sounds.  Pulmonary:     Effort: Pulmonary effort is normal.     Breath sounds: Normal breath sounds.  Chest:     Chest wall: Tenderness present.  Abdominal:     Palpations: Abdomen is soft.     Tenderness: There is no abdominal tenderness.  Skin:    General: Skin is warm and dry.  Neurological:     Mental Status: He is alert.     ED Results / Procedures / Treatments   Labs (all labs ordered are listed, but only abnormal results are displayed) Labs Reviewed  BASIC METABOLIC PANEL - Abnormal; Notable for the following components:      Result Value   Sodium 132 (*)    Chloride 88  (*)    Glucose, Bld 106 (*)    BUN 41 (*)    Creatinine, Ser 11.31 (*)    GFR, Estimated 5 (*)    All other components within normal limits  CBC - Abnormal; Notable for the following components:   HCT 38.2 (*)    All other components within normal limits  TROPONIN I (HIGH SENSITIVITY) - Abnormal; Notable for the following components:   Troponin I (High Sensitivity) 86 (*)    All other components within normal limits  TROPONIN I (HIGH SENSITIVITY)    EKG EKG Interpretation  Date/Time:  Thursday October 19 2022 14:20:21 EDT Ventricular Rate:  71 PR Interval:  186 QRS Duration: 111 QT Interval:  428 QTC Calculation: 466 R Axis:   123 Text Interpretation: Sinus rhythm Right axis deviation ST/T changes unchanged from earlier in the day Confirmed by Pricilla Loveless [phone]) on 10/19/2022 3:20:30 PM  Radiology DG Chest 2 View  Result Date: 10/19/2022 CLINICAL DATA:  Chest pain, LEFT side chest pain since 1100 hours EXAM: CHEST - 2 VIEW COMPARISON:  Earlier portable exam of 10/18/2022 FINDINGS: Enlargement of cardiac silhouette. Mediastinal contours and pulmonary vascularity normal. Atherosclerotic calcification aorta. Lungs clear. No pulmonary infiltrate, pleural effusion, or pneumothorax. Osseous structures unremarkable. IMPRESSION: Minimal enlargement of cardiac silhouette without acute infiltrate. Aortic Atherosclerosis (ICD10-I70.0). Electronically Signed   By: Ulyses Southward M.D.   On: 10/19/2022 13:52   CT Angio Chest PE W/Cm &/Or Wo Cm  Result Date: 10/18/2022 CLINICAL DATA:  Chest pain. EXAM: CT ANGIOGRAPHY CHEST WITH CONTRAST TECHNIQUE: Multidetector CT imaging of the chest was performed using the standard protocol during bolus administration of intravenous contrast. Multiplanar CT image reconstructions and MIPs were obtained to evaluate the vascular anatomy. RADIATION DOSE REDUCTION: This exam was performed according to the departmental dose-optimization program which includes automated  exposure control, adjustment of the mA and/or kV according to patient size and/or use of iterative reconstruction technique. CONTRAST:  75mL OMNIPAQUE IOHEXOL 350 MG/ML SOLN COMPARISON:  August 05, 2022. FINDINGS: Cardiovascular: Satisfactory opacification of the pulmonary arteries to the segmental level. No evidence of pulmonary embolism. Mild cardiomegaly. Extensive coronary artery calcifications are noted. No pericardial effusion. Mediastinum/Nodes: No enlarged mediastinal, hilar, or axillary lymph nodes. Thyroid gland, trachea, and esophagus demonstrate no significant findings. Lungs/Pleura: No pneumothorax or pleural effusion is noted. Minimal bilateral posterior basilar subsegmental atelectasis. Upper Abdomen: No acute abnormality. Musculoskeletal: No chest wall abnormality. No acute or significant osseous findings. Review of the MIP images confirms the above findings. IMPRESSION:  No definite evidence of pulmonary embolus. Minimal bilateral posterior basilar subsegmental atelectasis. Extensive coronary artery calcifications are noted suggesting coronary artery disease. Aortic Atherosclerosis (ICD10-I70.0). Electronically Signed   By: Lupita Raider M.D.   On: 10/18/2022 13:19   DG Chest Port 1 View  Result Date: 10/18/2022 CLINICAL DATA:  Chest pain. EXAM: PORTABLE CHEST 1 VIEW COMPARISON:  09/12/2022. FINDINGS: Low lung volumes accentuate the pulmonary vasculature and cardiomediastinal silhouette. No consolidation or pulmonary edema. No pleural effusion or pneumothorax. Visualized bones and upper abdomen are unremarkable. IMPRESSION: No evidence of acute cardiopulmonary disease. Electronically Signed   By: Orvan Falconer M.D.   On: 10/18/2022 08:59    Procedures Procedures    Medications Ordered in ED Medications  aspirin chewable tablet 324 mg (324 mg Oral Given 10/19/22 1412)    ED Course/ Medical Decision Making/ A&P                             Medical Decision Making Amount and/or  Complexity of Data Reviewed Labs: ordered.    Details: Opponent in the 80s, similar to yesterday.  No acute metabolic crisis. Radiology: ordered and independent interpretation performed.    Details: No CHF ECG/medicine tests: ordered and independent interpretation performed.    Details: Chronic ST/T changes.  Risk OTC drugs.   Patient has what appears to be a swollen and likely bruise/hematoma in his lip.  However I do not think acute drainage or wound management is needed at this time, especially with delay in presentation.  His chest pain is unlikely to be ACS.  He has had multiple previous episodes of chest pain with similar troponin levels.  I doubt PE and he just had a negative CTA yesterday.  I am more worried about his syncope that occurred at rest with some palpitations and lightheadedness.  He does have CHF though not with his severely depressed EF.  However he had seemingly unprovoked syncope.  Given this I have consulted family practice for admission.        Final Clinical Impression(s) / ED Diagnoses Final diagnoses:  None    Rx / DC Orders ED Discharge Orders     None         Pricilla Loveless, MD 10/19/22 [phone]

## 2022-10-19 NOTE — H&P (Addendum)
Hospital Admission History and Physical Service Pager: [phone]  Patient name: Corey Park Medical record number: [id number] Date of Birth: Aug 24, 1970 Age: 52 y.o. Gender: male  Primary Care Provider: Tiffany Kocher, DO Consultants: Nephrology  Code Status: FULL Preferred Emergency Contact: De Burrs [phone]   Chief Complaint: Worsening chest pain  Assessment and Plan: Corey Park is a 52 y.o. male presenting with acutely worsened chest pain in the setting of chronic hiccups and a recent syncopal episode 2 days prior to admission. PMHx significant for T2DM, ESRD on HD MWF W/fistula to the R arm, HTN, HFpEF, GERD, and HLD. He is legally blind. * Syncope and collapse Patient reports syncopal episode on 10/17/2022 in the context of sitting out in the hot sun and chronic poor p.o. intake, reports LOC following standing with subsequent injury to lower lip.  EKG nonconcerning for arrhythmias. Low suspicion for head trauma, neurological exam is non focal but could consider head CT if there is an acute change in mental status.  Patient is legally blind and shares that he has concerns about returning home following a syncopal episode, as he lives on his own and is unsure if he can care for himself should he collapse again. Will follow-up PT OT recs to see if he is a good candidate for SNF. -Admit to FMTS, attending Dr. Deirdre Priest -Monitor vitals -Monitor lip wound, consider draining hematoma if lip swelling worsens  -Ice packs requested to manage swelling -Follow-up orthostatic vitals -Start renal diet, carb modified -PT/OT -Follow-up morning CBC and BMP  Chest pain Reports acute on chronic CP exacerbation in setting of chronic hiccups.  Was in the ED yesterday with unremarkable workup, remained stable today.  Troponins flat 86 >>76, EKG unchanged from prior.  On previous admissions, CP has resolved on its own.  Suspect chest wall pain 2/2 chronic hiccups, there is exqusiite  tenderness along the left pectoral muscle and has historically responded well to topical Voltaren gel and lidocaine patches.  Appears stable, cardiac telemetry not indicated at this time. - CTM - Voltaren gel PRN - Consider adding lidocaine patch if pain refractory to Voltaren gel application. -Consider repeat EKG if CP worsens.  ESRD (end stage renal disease) on dialysis [facility]) Patient is ESRD on HD MWF with right fistula, has not missed any sessions and is tolerating well without complications. Attending [facility] at [address].  Reports CP subsides following HD sessions.  Will consult nephrology to put him on the schedule for tomorrow.  Weighs 99 kg (EDW 100kg).  -Nephrology consulted, follow-up recommendations -Restarted home calcium acetate     Other diagnoses later chronic and stable: T2DM: Last A1c 5.6% on3/19/2024.  Will hold on administering insulin for now, has previously responded well to sSSI in previous admissions.  CBGs q4hrs. HLD: Restart home Lipitor HTN: Restart equivalent of home olmesartan 40 mg CHF: Echo on 04/17/2022 showing LVEF 55%.  Does not appear volume overloaded.  Adherent to home Coreg 25 mg twice daily and ASA 81 mg daily, restarted on admission.  FEN/GI: Renal Diet VTE Prophylaxis: Heparin SQ  Disposition: Med-Surg  History of Present Illness:  Corey Park is a 52 y.o. male presenting with acutely worsened chest pain in the setting of chronic hiccups and a recent syncopal episode 2 days prior to admission. Patient was seen in the ED yesterday due to similar complaint and was discharged after negative work-up.  No acute findings at that time, labs including CBC, chemistries, troponins and BMP all  nonconcerning.  EKG was negative for any acute cardiac process, ACS at that time was ruled out. Was given fentanyl injection and discharged.  Patient received dialysis yesterday, no complications noted.  Patient reports a 5-year history of chronic  hiccups, reports chronic chest pain exacerbated by the hiccups.  Reports that every time he has a headache up he feels "it is pulling my heart".  The past few days he feels that this pain has worsened so much that he became concerned and decided to arrived to the ED, describes the pain as someone stepping on his chest.  It is exquisitely tender to light touch on the left side.  Reports that Voltaren gel and lidocaine patches have helped in the past.  Regarding his syncopal episode, patient reports that 2 days prior to admission he was sitting in his front porch by himself.  Describes feeling a "hot sensation" prior to standing up, when he attempted to stand up he lost consciousness and collapsed.  His lip was injured during the fall.  He is unsure of how long he was out but reports he regained consciousness shortly after the fall.  Denies any prior history of syncopal episodes.  He does report that in the setting of the syncopal episode he has had poor p.o. intake, especially after his HD sessions   ED course: Patient presenting via EMS from work for worsening left-sided chest pain he describes as stabbing that began earlier today.  Other medications received in the E: gabapentin.  CXR only showing cardiomegaly.  CT chest ordered due to desatting into the high 80s.  Ambulatory pulse ox in ED 90-93%, remaining on room air. Trops flat 86>>76.  Review Of Systems: Negative except as described in HPI  Pertinent Past Medical History: HFpEF Anemia of chronic disease Chronic hiccups Legally blind T2DM ESRD on HD MWF GERD HLD HTN GI bleed MRI Secondary hyperparathyroidism of renal origin Remainder reviewed in history tab.   Pertinent Past Surgical History: 12/22/2018 EGD with biopsy 07/29/2019 IR fluoroscopy guided central line 10/16/2019 right basilic vein transposition 01/29/20 second stage basilic vein transposition  Remainder reviewed in history tab.   Pertinent Social History: Tobacco use:  Denies current or past use Alcohol use: Denies Other Substance use: Denies Lives alone, son stays with him during breaks  Pertinent Family History: Remainder reviewed in history tab.   Important Outpatient Medications: Aspirin 81 mg Calcium acetate 1334 mg 3 times daily with meals Coreg 25 mg twice daily Gabapentin 100 mg 3 times daily Olmesartan 20 mg daily Pantoprazole 40 mg daily Rosuvastatin 10 mg nightly  Remainder reviewed in medication history.   Objective: BP 132/67   Pulse 74   Temp 98.1 F (36.7 C) (Oral)   Resp 11   Ht  (1.854 m)   Wt 99 kg   SpO2 99%   BMI 28.80 kg/m  Exam: General: Chronically ill-appearing male, awake and alert, cooperative HEENT: Normocephalic and atraumatic ENTM: Lower lip is appreciably edematous and tender to light touch, there are 2 distinct superficial lacerations on the inner lip Neck: Supple, nontender, no masses or thyromegaly Cardiovascular: RRR, no murmurs rubs or gallops appreciated Respiratory: Normal work of breathing on room air, CTAB Gastrointestinal: Nondistended, nontender, normal active bowel sounds Derm: Warm and dry Neuro: Overall nonfocal; alert and oriented x 4, CNI-XII grossly intact and unchanged from baseline, intact strength in all extremities,  Psych: Appears depressed, mood and affect congruent       Labs:  CBC BMET  Recent  Labs  Lab 10/19/22 1327  WBC 7.6  HGB 13.1  HCT 38.2*  PLT 160   Recent Labs  Lab 10/19/22 1327  NA 132*  K 4.6  CL 88*  CO2 29  BUN 41*  CREATININE 11.31*  GLUCOSE 106*  CALCIUM 9.2    Pertinent additional labs: 86>>76 EKG: normal EKG, normal sinus rhythm, unchanged from previous tracings.    Imaging Studies Performed: Chest XR Date Resulted: 10/19/2022 IMPRESSION Minimal enlargement of cardiac silhouette without acute infiltrate.   My Interpretation: Agree with findings above.   Lorri Frederick, MD 10/19/2022, 7:25 PM PGY-1, [facility]  Family Medicine  FPTS Intern pager: [phone], text pages welcome Secure chat group [facility] Teaching Service    I was personally present and re-performed the exam and medical decision making and verified the service and findings are accurately documented in the student's note.  Erick Alley, DO 10/19/2022 7:57 PM

## 2022-10-19 NOTE — ED Notes (Signed)
Pt stood up for orthostatic vs but had to sit down as pt unable to tolerate standing due to dizziness. Will notify MD.

## 2022-10-19 NOTE — Assessment & Plan Note (Addendum)
Plan for dialysis today. - Home calcium acetate

## 2022-10-20 ENCOUNTER — Other Ambulatory Visit (HOSPITAL_COMMUNITY): Payer: Self-pay

## 2022-10-20 DIAGNOSIS — R079 Chest pain, unspecified: Secondary | ICD-10-CM | POA: Diagnosis not present

## 2022-10-20 LAB — CBC
HCT: 34.3 % — ABNORMAL LOW (ref 39.0–52.0)
Hemoglobin: 11.4 g/dL — ABNORMAL LOW (ref 13.0–17.0)
MCH: 30.1 pg (ref 26.0–34.0)
MCHC: 33.2 g/dL (ref 30.0–36.0)
MCV: 90.5 fL (ref 80.0–100.0)
Platelets: 148 10*3/uL — ABNORMAL LOW (ref 150–400)
RBC: 3.79 MIL/uL — ABNORMAL LOW (ref 4.22–5.81)
RDW: 15.5 % (ref 11.5–15.5)
WBC: 6.6 10*3/uL (ref 4.0–10.5)
nRBC: 0 % (ref 0.0–0.2)

## 2022-10-20 LAB — BASIC METABOLIC PANEL
Anion gap: 15 (ref 5–15)
BUN: 49 mg/dL — ABNORMAL HIGH (ref 6–20)
CO2: 29 mmol/L (ref 22–32)
Calcium: 8.5 mg/dL — ABNORMAL LOW (ref 8.9–10.3)
Chloride: 89 mmol/L — ABNORMAL LOW (ref 98–111)
Creatinine, Ser: 12.94 mg/dL — ABNORMAL HIGH (ref 0.61–1.24)
GFR, Estimated: 4 mL/min — ABNORMAL LOW (ref >60)
Glucose, Bld: 129 mg/dL — ABNORMAL HIGH (ref 70–99)
Potassium: 3.9 mmol/L (ref 3.5–5.1)
Sodium: 133 mmol/L — ABNORMAL LOW (ref 135–145)

## 2022-10-20 LAB — GLUCOSE, CAPILLARY
Glucose-Capillary: 135 mg/dL — ABNORMAL HIGH (ref 70–99)
Glucose-Capillary: 136 mg/dL — ABNORMAL HIGH (ref 70–99)
Glucose-Capillary: 82 mg/dL (ref 70–99)
Glucose-Capillary: 89 mg/dL (ref 70–99)

## 2022-10-20 MED ORDER — CHLORHEXIDINE GLUCONATE CLOTH 2 % EX PADS
6.0000 | MEDICATED_PAD | Freq: Every day | CUTANEOUS | Status: DC
  Administered 2022-10-20: 6 via TOPICAL

## 2022-10-20 MED ORDER — CERTAVITE/ANTIOXIDANTS PO TABS
1.0000 | ORAL_TABLET | Freq: Every day | ORAL | 0 refills | Status: DC
  Filled 2022-10-20: qty 30, 30d supply, fill #0

## 2022-10-20 MED ORDER — FERRIC CITRATE 1 GM 210 MG(FE) PO TABS
420.0000 mg | ORAL_TABLET | Freq: Every day | ORAL | 0 refills | Status: DC
  Filled 2022-10-20: qty 60, 30d supply, fill #0

## 2022-10-20 MED ORDER — OLMESARTAN MEDOXOMIL 20 MG PO TABS
20.0000 mg | ORAL_TABLET | Freq: Every day | ORAL | 0 refills | Status: DC
  Filled 2022-10-20: qty 30, 30d supply, fill #0

## 2022-10-20 MED ORDER — ASPIRIN 81 MG PO TBEC
81.0000 mg | DELAYED_RELEASE_TABLET | Freq: Every day | ORAL | 12 refills | Status: DC
  Filled 2022-10-20: qty 30, 30d supply, fill #0

## 2022-10-20 MED ORDER — ROSUVASTATIN CALCIUM 5 MG PO TABS
10.0000 mg | ORAL_TABLET | Freq: Every day | ORAL | Status: DC
  Administered 2022-10-20: 10 mg via ORAL
  Filled 2022-10-20: qty 2

## 2022-10-20 MED ORDER — DICLOFENAC SODIUM 1 % EX GEL
2.0000 g | Freq: Four times a day (QID) | CUTANEOUS | 0 refills | Status: DC | PRN
  Filled 2022-10-20: qty 200, 25d supply, fill #0

## 2022-10-20 MED ORDER — ROSUVASTATIN CALCIUM 10 MG PO TABS
10.0000 mg | ORAL_TABLET | Freq: Every day | ORAL | 0 refills | Status: DC
  Filled 2022-10-20: qty 30, 30d supply, fill #0

## 2022-10-20 MED ORDER — CARVEDILOL 25 MG PO TABS
25.0000 mg | ORAL_TABLET | Freq: Two times a day (BID) | ORAL | 0 refills | Status: DC
  Filled 2022-10-20: qty 60, 30d supply, fill #0

## 2022-10-20 MED ORDER — GABAPENTIN 100 MG PO CAPS
100.0000 mg | ORAL_CAPSULE | Freq: Three times a day (TID) | ORAL | 0 refills | Status: DC
  Filled 2022-10-20: qty 90, 30d supply, fill #0

## 2022-10-20 MED ORDER — CALCIUM ACETATE (PHOS BINDER) 667 MG PO CAPS
1334.0000 mg | ORAL_CAPSULE | Freq: Three times a day (TID) | ORAL | 0 refills | Status: DC
  Filled 2022-10-20: qty 90, 15d supply, fill #0

## 2022-10-20 MED ORDER — PANTOPRAZOLE SODIUM 40 MG PO TBEC
40.0000 mg | DELAYED_RELEASE_TABLET | Freq: Every day | ORAL | 0 refills | Status: DC
  Filled 2022-10-20: qty 30, 30d supply, fill #0

## 2022-10-20 NOTE — Progress Notes (Signed)
OT Cancellation Note  Patient Details Name: Corey Park MRN: [id number] DOB: 1970-11-14   Cancelled Treatment:    Reason Eval/Treat Not Completed: Patient declined, no reason specified (frequent refusals) OT to f/u as able  Tristan Schroeder 10/20/2022, 1:26 PM

## 2022-10-20 NOTE — Discharge Summary (Addendum)
Family Medicine Teaching [facility] Discharge Summary  Patient name: Corey Park Medical record number: [id number] Date of birth: 07-Dec-1970 Age: 52 y.o. Gender: male Date of Admission: 10/19/2022  Date of Discharge: 10/20/2022 Admitting Physician: Carney Living, MD  Primary Care Provider: Tiffany Kocher, DO Consultants: Nephrology  Indication for Hospitalization: Syncope  Brief Hospital Course:  Corey Park is a 52 y.o male with history of T2DM, ESRD on HD MWF W/fistula to the R arm, HTN, HFpEF, GERD, and HLD. He is legally blind. Admitted for syncopal work-up and dialysis.   Syncope and collapse Reported a syncopal episode on 10/17/2022 in the context of sitting out in the hot sun.  Chronic poor p.o. intake.  EKG was nonconcerning for arrhythmia, low suspicion of head trauma, he remained neurologically intact without focal neurologic deficits. Suspect, orthostatic hypotension in setting of vasodilation and chronic dialysis with poor p.o. intake. His primary reason for seeking medical care was his concern he is unable to care for himself at home. On 10/20/2022 he requested to be discharged after dialysis. No dizziness or syncopal event during hospitalization.  He reports he is safe at home.  Additionally, patient declined PT/OT evaluation.  He was discharged home in stable condition.  Chest pain Acute on chronic chest pain exacerbation in setting of chronic hiccups.  Troponins trended flat.  EKG was unchanged from prior.  Low suspicion of ACS.  Please see follow-up recommendations with regard to his hiccups.  Chest pain resolved at time of discharge.  ESRD Due to hospital dialysis clinic volume, patient did not receive dialysis in the hospital on 10/20/2022.  He instead received cab voucher for transport from hospital to outpatient dialysis for 4/26, with voucher for transport from dialysis back to home.  Other chronic conditions were medically managed with home  medications and formulary alternatives as necessary (ESRD on HD, T2DM, HTN, GERD, HLD)  Follow-up recommendations Suspect orthostatic hypotension. Consider decreasing olemesartan.  Patient has chronic hiccups (5 years). Failed PPI, Gabapentin and Thorazine. Could consider haldol OP. GI referral was sent in January and patient has appointment with [facility] on 11/30/2022. We recommend he keeps this appointment. Personal care service application was sent by [facility] in January 2024. Unfortunately does not appear patient nor [facility] have received a response. Recommend PCP follow-up on acquiring care.  Patient is transitioning to new PCP. [facility] will provide care for 30 days. New PCP appointment on 10/30/2022 with Ricky Stabs NP.  Discharge Diagnoses/Problem List:  Principal Problem:   Syncope and collapse Active Problems:   Nonspecific chest pain   ESRD (end stage renal disease) on dialysis [facility])  Disposition: Home  Discharge Condition: Stable  Discharge Exam:  General: NAD, resting in bed Cardiovascular: RRR, no murmurs Respiratory: CTAB, normal WOB on RA Abdomen: Soft, not tender, not distended Extremities: Moving all 4 extremities, no edema  Significant Procedures: None  Significant Labs and Imaging:  Recent Labs  Lab 10/19/22 1327 10/20/22 0014  WBC 7.6 6.6  HGB 13.1 11.4*  HCT 38.2* 34.3*  PLT 160 148*   Recent Labs  Lab 10/19/22 1327 10/20/22 0014  NA 132* 133*  K 4.6 3.9  CL 88* 89*  CO2 29 29  GLUCOSE 106* 129*  BUN 41* 49*  CREATININE 11.31* 12.94*  CALCIUM 9.2 8.5*   Results/Tests Pending at Time of Discharge: None  Discharge Medications:  Allergies as of 10/20/2022   No Known Allergies      Medication List     STOP taking these  medications    acetaminophen 500 MG tablet Commonly known as: TYLENOL   bacitracin ointment   Calcium Acetate 667 MG Tabs Replaced by: calcium acetate 667 MG capsule   lidocaine 5 % Commonly known as: LIDODERM   multivitamin  Tabs tablet Replaced by: CertaVite/Antioxidants Tabs       TAKE these medications    Aspirin Low Dose 81 MG tablet Generic drug: aspirin EC Take 1 tablet (81 mg total) by mouth daily. Swallow whole. What changed: additional instructions   calcium acetate 667 MG capsule Commonly known as: PHOSLO Take 2 capsules (1,334 mg total) by mouth with breakfast, with lunch, and with evening meal. Replaces: Calcium Acetate 667 MG Tabs   carvedilol 25 MG tablet Commonly known as: COREG Take 1 tablet (25 mg total) by mouth 2 (two) times daily with a meal. What changed: when to take this   CertaVite/Antioxidants Tabs Take 1 tablet by mouth at bedtime. Replaces: multivitamin Tabs tablet   diclofenac Sodium 1 % Gel Commonly known as: VOLTAREN Apply 2 g topically 4 (four) times daily as needed (Pain). What changed:  when to take this reasons to take this   ferric citrate 1 GM 210 MG(Fe) tablet Commonly known as: Auryxia Take 2 tablets (420 mg total) by mouth daily.   gabapentin 100 MG capsule Commonly known as: Neurontin Take 1 capsule (100 mg total) by mouth 3 (three) times daily. What changed:  when to take this additional instructions   olmesartan 20 MG tablet Commonly known as: BENICAR Take 1 tablet (20 mg total) by mouth daily.   pantoprazole 40 MG tablet Commonly known as: PROTONIX Take 1 tablet (40 mg total) by mouth daily.   rosuvastatin 10 MG tablet Commonly known as: CRESTOR Take 1 tablet (10 mg total) by mouth daily.        Discharge Instructions: Please refer to Patient Instructions section of EMR for full details.  Patient was counseled important signs and symptoms that should prompt return to medical care, changes in medications, dietary instructions, activity restrictions, and follow up appointments.   Follow-Up Appointments:  Follow-up Information     [facility]. Schedule an appointment as soon as possible for a visit on  10/30/2022.   Specialty: Family Medicine Why: at 1100 with Amy Ann Held information: [address], [address] [place] [address] [phone]                Tiffany Kocher, DO 10/20/2022, 1:12 PM PGY-1, [facility] Family Medicine    I have evaluated this patient along with Dr. Claudean Severance and reviewed the above note, making necessary revisions.  Dorothyann Gibbs, MD 10/20/2022, 3:33 PM PGY-2, [facility] Family Medicine

## 2022-10-20 NOTE — TOC Progression Note (Signed)
Transition of Care [facility]) - Progression Note    Patient Details  Name: Corey Park MRN: [id number] Date of Birth: 01/27/1971  Transition of Care [facility]) CM/SW Contact  Leander Rams, LCSW Phone Number: 10/20/2022, 3:19 PM  Clinical Narrative:    CSW provided 2 cab vouchers to pt for to dc to HD clinic and then from clinic to home. RN Shanda Bumps completed vouchers and CSW signed. Pt has transportation completley set up.   TOC will remain available.         Expected Discharge Plan and Services         Expected Discharge Date: 10/20/22                                     Social Determinants of Health (SDOH) Interventions SDOH Screenings   Food Insecurity: No Food Insecurity (10/19/2022)  Housing: Low Risk  (10/19/2022)  Transportation Needs: Unmet Transportation Needs (10/19/2022)  Utilities: Not At Risk (10/19/2022)  Depression (PHQ2-9): Low Risk  (08/10/2022)  Tobacco Use: Low Risk  (10/19/2022)    Readmission Risk Interventions     No data to display         Oletta Lamas, MSW, LCSWA, LCASA Transitions of Care  Clinical Social Worker I

## 2022-10-20 NOTE — Progress Notes (Signed)
PT Cancellation Note  Patient Details Name: Corey Park MRN: [id number] DOB: [birth year]   Cancelled Treatment:    Reason Eval/Treat Not Completed: Patient declined, no reason specified (pt refused therapy evaluation stating he plans to go to HD and home and does not require therapy input. Pt stated no to RN, MD and PT. will sign off. Please reorder should pt desire to participate)   Enedina Finner Kamiah Fite 10/20/2022, 7:46 AM Merryl Hacker, PT Acute Rehabilitation Services Office: [phone]

## 2022-10-20 NOTE — Plan of Care (Signed)
Corey Park 07-24-1970 [id number]  ESRD patient on 3rd shift HD at [facility], admitted to observation for syncope and LOC.  Labs appear stable with O2 sat 100% on RA.  Will write orders for HD today later today.  If patient is stable for discharge prior to his PM chair time, and had transportation could go to outpatient dialysis tonight.  Will complete full consult if patient becomes inpatient.   HD orders: [facility] MWF 3rd 450/AF 1.5 EDW 101kg 2Ca 2K RU AVF Hep 6000 qHD Calcitriol 1.73mcg qHD, sensipar 30mg  qHD   Virgina Norfolk, PA-C [employer]

## 2022-10-20 NOTE — Progress Notes (Addendum)
Daily Progress Note Intern Pager: [phone]  Patient name: Corey Park Medical record number: [id number] Date of birth: July 19, 1970 Age: 52 y.o. Gender: male  Primary Care Provider: Tiffany Kocher, DO Consultants: Nephrology Code Status: FULL  Pt Overview and Major Events to Date:  4/25: Admitted  Assessment and Plan: Corey Park is a 52 y.o. male presenting with acutely worsened chest pain in the setting of chronic hiccups and a recent syncopal episode 2 days prior to admission. PMHx significant for T2DM, ESRD on HD MWF W/fistula to the R arm, HTN, HFpEF, GERD, and HLD. He is legally blind.   Chest pain resolved.  Unremarkable workup for syncope.  Patient is requesting discharge after dialysis.  He informs primary team (Dr. Claudean Severance and Dr. Deirdre Priest) that he is dissatisfied with his care at [facility] and would like to pursue primary care services through a different practice.  Primary team listened to patient's concerns, and tried to address them to the best of our ability. He states strong desire to leave [facility] practice, and is requesting we refill his medications today.  We informed patient that we will provide care for 30 days, and that he will need to arrange a different PCP on his own.  Patient states that he will contact a Child psychotherapist to assist in finding new PCP. I will place [facility] referral today to assist.  He states that he needs personal care services.  This was worked on by PCP Dr. Claudean Severance by [facility] has been sent.  Recommend he follow-up with new PCP to discuss status of personal care service application.  Additionally, patient has chronic hiccups.  Has failed medical management with PPI, gabapentin, Thorazine.  There was concern for possible esophageal dysmotility, and referral sent by Dr. Claudean Severance to GI in January. Of note he does appear to have GI appointment scheduled for June 2024. However patient reports he never received call for appointment.  We recommend he follow-up  with new PCP to discuss GI referral -to coordinate his care moving forward.  * Syncope and collapse Patient has declined PT evaluation this morning.  He would like to go home after dialysis. He feels he would not become dizzy if he gets up slowly. Reports he is safe at home. He is alert and oriented x 4, no overt focal neurologic deficits on exam today. -F/u orthostatics, consider reducing olmesartan  Chest pain Resolved.  ESRD (end stage renal disease) on dialysis [facility]) Plan for dialysis today. - Home calcium acetate   FEN/GI: Renal, carb modified PPx: Heparin Dispo:Home today after dialysis  Subjective:  Chest pain resolved. Requesting DC home after dialysis.  Objective: Temp:  [97.6 F (36.4 C)-99 F (37.2 C)] 97.6 F (36.4 C) (04/26 0420) Pulse Rate:  [69-82] 70 (04/26 0420) Resp:  [11-23] 20 (04/26 0420) BP: (105-132)/(55-87) 122/80 (04/26 0420) SpO2:  [96 %-100 %] 100 % (04/26 0420) Weight:  [99 kg-99.3 kg] 99.3 kg (04/25 1919) Physical Exam: General: NAD, resting in bed Cardiovascular: RRR, no murmurs Respiratory: CTAB, normal WOB on RA Abdomen: Soft, not tender, not distended Extremities: Moving all 4 extremities, no edema  Laboratory: Most recent CBC Lab Results  Component Value Date   WBC 6.6 10/20/2022   HGB 11.4 (L) 10/20/2022   HCT 34.3 (L) 10/20/2022   MCV 90.5 10/20/2022   PLT 148 (L) 10/20/2022   Most recent BMP    Latest Ref Rng & Units 10/20/2022   12:14 AM  BMP  Glucose 70 - 99 mg/dL 147  BUN 6 - 20 mg/dL 49   Creatinine 7.82 - 1.24 mg/dL 95.62   Sodium 130 - 865 mmol/L 133   Potassium 3.5 - 5.1 mmol/L 3.9   Chloride 98 - 111 mmol/L 89   CO2 22 - 32 mmol/L 29   Calcium 8.9 - 10.3 mg/dL 8.5    Tiffany Kocher, DO 10/20/2022, 9:42 AM  PGY-1, [place] [facility] FPTS Intern pager: [phone], text pages welcome Secure chat group [facility] Teaching Service

## 2022-10-20 NOTE — Discharge Instructions (Addendum)
Dear [name],  Thank you for letting us participate in your care. You were hospitalized for syncope and collapse.  POST-HOSPITAL & CARE INSTRUCTIONS We have re-filled your medication You have a scheduled GI appointment on 11/30/2022 We will continue your care for 30 days while you establish care at a different place Go to your follow up appointments (listed below)   DOCTOR'S APPOINTMENT   Future Appointments  Date Time Provider Department Center  11/30/2022  9:30 AM Meredith Pel, NP [facility]    Take care and be well!  Family Medicine Teaching Service Inpatient Team [place]  [facility]  [address] [place], [place] [address] [phone]

## 2022-10-20 NOTE — Progress Notes (Addendum)
Advised by staff this am that pt may possibly d/c later today. Pt receives out-pt HD at [facility] on MWF 3rd shift. Pt arrives at 4:00 for 4:20 chair time. Pt uses [facility] for transportation to/from HD and they will not transport pt from hospital to clinic then clinic to home with appt made same day. Inquired of team if pt stable for d/c today, would it be possible for TOC/hospital to provide transportation from hospital to clinic and from clinic to home to avoid pt having to receive in-pt HD today due to high census. Awaiting response from team on plans/options.   Olivia Canter Renal Navigator [phone]  Addendum at 1:52 pm: Pt will d/c today in order to receive out-pt HD today at normal clinic/schedule. TOC staff have offered/provided pt a cab voucher from hospital to clinic and then cab voucher from clinic to home. Spoke to [place], Charity fundraiser at [facility] to make clinic aware of the above info. Aram Beecham aware to expect pt this afternoon for treatment ant that pt has been provided cab voucher from hospital. Inpt HD staff provided update as well.

## 2022-10-20 NOTE — Care Management Obs Status (Signed)
MEDICARE OBSERVATION STATUS NOTIFICATION   Patient Details  Name: Corey Park MRN: [id number] Date of Birth: 09-29-1970   Medicare Observation Status Notification Given:  Yes  Verbal permission to sign due to remote  Lockie Pares, RN 10/20/2022, 10:19 AM

## 2022-10-20 NOTE — Progress Notes (Signed)
I saw pt-  he says he feels fine-  not dizzy, said he was "tired of repeating himself" he thought he was to get HD this AM and then discharged home-  I told him since there was a high HD incenter census today and low staffing-  I could not guarantee when he might get HD.  It remains an option for him to go to his OP unit as he dialyzes at 4:30 PM.  We will keep him on the list to get HD here today   Cecille Aver

## 2022-10-23 ENCOUNTER — Telehealth: Payer: Self-pay

## 2022-10-23 NOTE — Telephone Encounter (Signed)
Transition Care Management Unsuccessful Follow-up Telephone Call  Date of discharge and from where:  10/18/2022 [facility]   Attempts:  1st Attempt  Reason for unsuccessful TCM follow-up call:  Left voice message    Lenard Forth [place] Guide, [facility] [phone] [address] [place], [place], [place] [address] Phone: [phone] Email: [email or web address]

## 2022-10-24 ENCOUNTER — Telehealth: Payer: Self-pay

## 2022-10-24 NOTE — Telephone Encounter (Signed)
Transition Care Management Unsuccessful Follow-up Telephone Call  Date of discharge and from where:  4/24 [facility]   Attempts:  2nd Attempt  Reason for unsuccessful TCM follow-up call:  Left voice message   Lenard Forth [facility] Guide, [facility] [phone] [address] [place], [place], [place] [address] Phone: [phone] Email: [email or web address]

## 2022-10-26 NOTE — Progress Notes (Signed)
Erroneous encounter-disregard

## 2022-10-30 ENCOUNTER — Encounter: Payer: Medicare Other | Admitting: Family

## 2022-10-30 DIAGNOSIS — K219 Gastro-esophageal reflux disease without esophagitis: Secondary | ICD-10-CM

## 2022-10-30 DIAGNOSIS — R079 Chest pain, unspecified: Secondary | ICD-10-CM

## 2022-10-30 DIAGNOSIS — N186 End stage renal disease: Secondary | ICD-10-CM

## 2022-10-30 DIAGNOSIS — Z09 Encounter for follow-up examination after completed treatment for conditions other than malignant neoplasm: Secondary | ICD-10-CM

## 2022-10-30 DIAGNOSIS — E1122 Type 2 diabetes mellitus with diabetic chronic kidney disease: Secondary | ICD-10-CM

## 2022-10-30 DIAGNOSIS — Z7689 Persons encountering health services in other specified circumstances: Secondary | ICD-10-CM

## 2022-10-30 DIAGNOSIS — I10 Essential (primary) hypertension: Secondary | ICD-10-CM

## 2022-10-30 DIAGNOSIS — R066 Hiccough: Secondary | ICD-10-CM

## 2022-10-30 DIAGNOSIS — E785 Hyperlipidemia, unspecified: Secondary | ICD-10-CM

## 2022-10-30 DIAGNOSIS — R55 Syncope and collapse: Secondary | ICD-10-CM

## 2022-11-30 ENCOUNTER — Ambulatory Visit: Payer: Medicare Other | Admitting: Nurse Practitioner

## 2022-11-30 NOTE — Progress Notes (Deleted)
Assessment and Plan   Primary GI:  Brief Narrative:  52 y.o. yo male with multiple medical problems not limited to ESRD on hemodialysis, diabetes, CHF,  blindness, H. pylori gastritis, diverticulosis, chronic constipation, gastric ulcer  Recurrent hiccups  Chronic normocytic anemia, likely anemia of chronic disease.  Hemoglobin stable at 11.4  Chronic intermittent thrombocytopenia.   Platelets stable at 148   History of Present Illness   Chief complaint: Hiccups   Patient was seen in September 2022 for refractory hiccups, chest pain, heartburn and dysphagia.  He underwent upper endoscopy, see results below.  He had a CT scan of the chest and upper esophagus to assess for anatomic lesions.  He was tried on Thorazine 25 mg 3 times daily. Check wBC.  He was already on daily pantoprazole.  GI findings on the CT scan included a patulous esophagus.   Patient was admitted to the hospital overnight on 10/18/2022 following a syncopal episode.  He was felt to have had an episode of orthostatic hypotension in setting of vasodilation after sitting in the hot sun.  He was also having chest pain.  Troponins trended flat.  EKG was unchanged from prior and there was a low suspicion of ACS.  Chest CTA without acute findings. He was also complaining of hiccups but already had a scheduled appointment with [place]     Procedure risk assessment:  No history of CHF.  No supplemental 02 use at home.  Not a known difficult airway Anticoagulant:      Previous GI Endoscopies / Labs / Imaging   MOST RECENT STUDIES  Sep 2022 EGD for dysphagia and unexplained chest - Fluid in the esophagus. - Discolored mucosa in the esophagus. Biopsied. - Gastritis. Biopsied. - Nodule found in the duodenum, questionable cystic lesion. Biopsied. - Nodule found in the duodenum. Biopsied. - No abnormalities found to explain dysphagia. Esophageal findings of copious frothy sputum may be suggestive of an underlying esophageal  motility disorder.  Diagnosis 1. Surgical [P], distal duodenum nodule - BENIGN DUODENAL MUCOSA - NO ACUTE INFLAMMATION, VILLOUS BLUNTING OR INCREASED INTRAEPITHELIAL LYMPHOCYTES IDENTIFIED 2. Surgical [P], duodenal bulb nodules - PEPTIC DUODENITIS - NO DYSPLASIA OR MALIGNANCY IDENTIFIED 3. Surgical [P], gastric antrum and gastric body - CHRONIC ACTIVE GASTRITIS - H. PYLORI ORGANISMS PRESENT - NO INTESTINAL METAPLASIA IDENTIFIED - SEE COMMENT 4. Surgical [P], mid esophagus and distal esophagus - BENIGN SQUAMOUS MUCOSA - NO INCREASED INTRAEPITHELIAL EOSINOPHILS  September 2022 screening colonoscopy - Diverticulosis in the sigmoid colon. - Large lipoma in the transverse colon. - Non-bleeding internal hemorrhoids  10/18/22 CTA chest IMPRESSION: No definite evidence of pulmonary embolus.   Minimal bilateral posterior basilar subsegmental atelectasis.   Extensive coronary artery calcifications are noted suggesting coronary artery disease.   Aortic Atherosclerosis (ICD10-I70.0).     Latest Ref Rng & Units 09/13/2022    8:20 AM 08/07/2022    6:34 AM 04/19/2022    8:04 AM  Hepatic Function  Albumin 3.5 - 5.0 g/dL 3.3  2.9  2.7        Latest Ref Rng & Units 10/20/2022   12:14 AM 10/19/2022    1:27 PM 10/18/2022    8:45 AM  CBC  WBC 4.0 - 10.5 K/uL 6.6  7.6  8.5   Hemoglobin 13.0 - 17.0 g/dL 16.1  09.6  04.5   Hematocrit 39.0 - 52.0 % 34.3  38.2  35.4   Platelets 150 - 400 K/uL 148  160  144  Past Medical History:  Diagnosis Date   Acute CHF (congestive heart failure) (HCC) 07/27/2019   AKI (acute kidney injury) (HCC) 12/11/2016   Allergy, unspecified, initial encounter 08/25/2019   Anemia 2021   Anesthesia of skin 03/31/2020   Asthma    as a child   Blind    Cellulitis, perineum 11/26/2019   CHF (congestive heart failure) (HCC) 2021   Chronic kidney disease, stage V (HCC) 06/17/2018   Chronic kidney disease, stage V (HCC) 06/17/2018   Complication of vascular  dialysis catheter 08/25/2019   COVID-19 virus infection 07/27/2019   Diabetes mellitus without complication (HCC)    type 2   Dilated cardiomyopathy (HCC) 07/03/2018   Elevated troponin    Encounter for removal of sutures 08/04/2020   Fe deficiency anemia 12/23/2018   Fluid overload 12/11/2016   Hypertension    Hypertensive urgency 12/11/2016   Hypomagnesemia 12/13/2016   Legally blind    B/L   Pain, unspecified 10/17/2019   Pneumonia 2021   Renal disorder    dialysis T-TH-Sat   Shortness of breath 11/10/2019   Symptomatic anemia 12/21/2018   Type 2 diabetes mellitus with diabetic peripheral angiopathy without gangrene (HCC) 08/25/2019   Unspecified protein-calorie malnutrition (HCC) 08/25/2019    Past Surgical History:  Procedure Laterality Date   BASCILIC VEIN TRANSPOSITION Right 10/16/2019   Procedure: Basilic Vein Transposition Right Arm;  Surgeon: Nada Libman, MD;  Location: [facility] OR;  Service: Vascular;  Laterality: Right;   BASCILIC VEIN TRANSPOSITION Right 01/29/2020   Procedure: RIGHT ARM SECOND STAGE BASCILIC VEIN TRANSPOSITION;  Surgeon: Nada Libman, MD;  Location: [facility] OR;  Service: Vascular;  Laterality: Right;   BIOPSY  12/22/2018   Procedure: BIOPSY;  Surgeon: Carman Ching, MD;  Location: [facility] ENDOSCOPY;  Service: Endoscopy;;   ESOPHAGOGASTRODUODENOSCOPY N/A 12/22/2018   Procedure: ESOPHAGOGASTRODUODENOSCOPY (EGD);  Surgeon: Carman Ching, MD;  Location: [facility] ENDOSCOPY;  Service: Endoscopy;  Laterality: N/A;   IR FLUORO GUIDE CV LINE RIGHT  07/29/2019   IR US GUIDE VASC ACCESS RIGHT  07/29/2019    Current Medications, Allergies, Family History and Social History were reviewed in [employer] record.     Current Outpatient Medications  Medication Sig Dispense Refill   aspirin EC 81 MG tablet Take 1 tablet (81 mg total) by mouth daily. Swallow whole. 30 tablet 12   calcium acetate (PHOSLO) 667 MG capsule Take 2 capsules (1,334 mg total) by mouth with breakfast,  with lunch, and with evening meal. 90 capsule 0   carvedilol (COREG) 25 MG tablet Take 1 tablet (25 mg total) by mouth 2 (two) times daily with a meal. 60 tablet 0   diclofenac Sodium (VOLTAREN) 1 % GEL Apply 2 g topically 4 (four) times daily as needed (Pain). 350 g 0   ferric citrate (AURYXIA) 1 GM 210 MG(Fe) tablet Take 2 tablets (420 mg total) by mouth daily. 60 tablet 0   gabapentin (NEURONTIN) 100 MG capsule Take 1 capsule (100 mg total) by mouth 3 (three) times daily. 90 capsule 0   Multiple Vitamins-Minerals (CERTAVITE/ANTIOXIDANTS) TABS Take 1 tablet by mouth at bedtime. 30 tablet 0   olmesartan (BENICAR) 20 MG tablet Take 1 tablet (20 mg total) by mouth daily. 30 tablet 0   pantoprazole (PROTONIX) 40 MG tablet Take 1 tablet (40 mg total) by mouth daily. 30 tablet 0   rosuvastatin (CRESTOR) 10 MG tablet Take 1 tablet (10 mg total) by mouth daily. 30 tablet 0   No current  facility-administered medications for this visit.    Review of Systems: No chest pain. No shortness of breath. No urinary complaints.    Physical Exam  Wt Readings from Last 3 Encounters:  10/19/22 218 lb 14.7 oz (99.3 kg)  10/18/22 220 lb (99.8 kg)  09/13/22 221 lb 1.9 oz (100.3 kg)    There were no vitals taken for this visit. Constitutional:  Pleasant, generally well appearing ***male in no acute distress. Psychiatric: Normal mood and affect. Behavior is normal. EENT: Pupils normal.  Conjunctivae are normal. No scleral icterus. Neck supple.  Cardiovascular: Normal rate, regular rhythm.  Pulmonary/chest: Effort normal and breath sounds normal. No wheezing, rales or rhonchi. Abdominal: Soft, nondistended, nontender. Bowel sounds active throughout. There are no masses palpable. No hepatomegaly. Neurological: Alert and oriented to person place and time.  Extremities: *** edema Skin: Skin is warm and dry. No rashes noted.  Willette Cluster, NP  11/30/2022, 8:20 AM  Cc:  Tiffany Kocher, DO

## 2022-12-11 ENCOUNTER — Other Ambulatory Visit: Payer: Self-pay

## 2022-12-11 ENCOUNTER — Inpatient Hospital Stay (HOSPITAL_COMMUNITY)
Admission: EM | Admit: 2022-12-11 | Discharge: 2022-12-14 | DRG: 286 | Disposition: A | Discharge status: Home / Self Care | Payer: Medicare Other | Attending: Internal Medicine | Admitting: Internal Medicine

## 2022-12-11 ENCOUNTER — Emergency Department (HOSPITAL_COMMUNITY): Payer: Medicare Other

## 2022-12-11 ENCOUNTER — Inpatient Hospital Stay (HOSPITAL_COMMUNITY): Payer: Medicare Other

## 2022-12-11 DIAGNOSIS — Z992 Dependence on renal dialysis: Secondary | ICD-10-CM | POA: Diagnosis not present

## 2022-12-11 DIAGNOSIS — Z8711 Personal history of peptic ulcer disease: Secondary | ICD-10-CM

## 2022-12-11 DIAGNOSIS — Z91158 Patient's noncompliance with renal dialysis for other reason: Secondary | ICD-10-CM

## 2022-12-11 DIAGNOSIS — E1122 Type 2 diabetes mellitus with diabetic chronic kidney disease: Secondary | ICD-10-CM | POA: Diagnosis present

## 2022-12-11 DIAGNOSIS — I5022 Chronic systolic (congestive) heart failure: Secondary | ICD-10-CM | POA: Diagnosis not present

## 2022-12-11 DIAGNOSIS — Z8701 Personal history of pneumonia (recurrent): Secondary | ICD-10-CM

## 2022-12-11 DIAGNOSIS — D649 Anemia, unspecified: Secondary | ICD-10-CM

## 2022-12-11 DIAGNOSIS — Z91148 Patient's other noncompliance with medication regimen for other reason: Secondary | ICD-10-CM

## 2022-12-11 DIAGNOSIS — I2510 Atherosclerotic heart disease of native coronary artery without angina pectoris: Secondary | ICD-10-CM | POA: Diagnosis not present

## 2022-12-11 DIAGNOSIS — E860 Dehydration: Secondary | ICD-10-CM | POA: Diagnosis present

## 2022-12-11 DIAGNOSIS — D631 Anemia in chronic kidney disease: Secondary | ICD-10-CM

## 2022-12-11 DIAGNOSIS — I420 Dilated cardiomyopathy: Secondary | ICD-10-CM | POA: Diagnosis not present

## 2022-12-11 DIAGNOSIS — R0789 Other chest pain: Secondary | ICD-10-CM | POA: Diagnosis present

## 2022-12-11 DIAGNOSIS — R072 Precordial pain: Secondary | ICD-10-CM

## 2022-12-11 DIAGNOSIS — R066 Hiccough: Secondary | ICD-10-CM | POA: Diagnosis not present

## 2022-12-11 DIAGNOSIS — D175 Benign lipomatous neoplasm of intra-abdominal organs: Secondary | ICD-10-CM | POA: Diagnosis present

## 2022-12-11 DIAGNOSIS — J9811 Atelectasis: Secondary | ICD-10-CM | POA: Diagnosis present

## 2022-12-11 DIAGNOSIS — Z5982 Transportation insecurity: Secondary | ICD-10-CM

## 2022-12-11 DIAGNOSIS — E119 Type 2 diabetes mellitus without complications: Secondary | ICD-10-CM

## 2022-12-11 DIAGNOSIS — Z833 Family history of diabetes mellitus: Secondary | ICD-10-CM

## 2022-12-11 DIAGNOSIS — R7989 Other specified abnormal findings of blood chemistry: Secondary | ICD-10-CM | POA: Diagnosis present

## 2022-12-11 DIAGNOSIS — E785 Hyperlipidemia, unspecified: Secondary | ICD-10-CM | POA: Diagnosis present

## 2022-12-11 DIAGNOSIS — Z7982 Long term (current) use of aspirin: Secondary | ICD-10-CM | POA: Diagnosis not present

## 2022-12-11 DIAGNOSIS — N186 End stage renal disease: Secondary | ICD-10-CM | POA: Diagnosis not present

## 2022-12-11 DIAGNOSIS — I5042 Chronic combined systolic (congestive) and diastolic (congestive) heart failure: Secondary | ICD-10-CM | POA: Diagnosis present

## 2022-12-11 DIAGNOSIS — I10 Essential (primary) hypertension: Secondary | ICD-10-CM | POA: Diagnosis not present

## 2022-12-11 DIAGNOSIS — R112 Nausea with vomiting, unspecified: Secondary | ICD-10-CM | POA: Diagnosis present

## 2022-12-11 DIAGNOSIS — K648 Other hemorrhoids: Secondary | ICD-10-CM | POA: Diagnosis present

## 2022-12-11 DIAGNOSIS — J45909 Unspecified asthma, uncomplicated: Secondary | ICD-10-CM | POA: Diagnosis present

## 2022-12-11 DIAGNOSIS — E1151 Type 2 diabetes mellitus with diabetic peripheral angiopathy without gangrene: Secondary | ICD-10-CM | POA: Diagnosis present

## 2022-12-11 DIAGNOSIS — Z8616 Personal history of COVID-19: Secondary | ICD-10-CM

## 2022-12-11 DIAGNOSIS — I132 Hypertensive heart and chronic kidney disease with heart failure and with stage 5 chronic kidney disease, or end stage renal disease: Secondary | ICD-10-CM

## 2022-12-11 DIAGNOSIS — D509 Iron deficiency anemia, unspecified: Secondary | ICD-10-CM | POA: Diagnosis present

## 2022-12-11 DIAGNOSIS — I214 Non-ST elevation (NSTEMI) myocardial infarction: Principal | ICD-10-CM

## 2022-12-11 DIAGNOSIS — D500 Iron deficiency anemia secondary to blood loss (chronic): Secondary | ICD-10-CM | POA: Insufficient documentation

## 2022-12-11 DIAGNOSIS — K5730 Diverticulosis of large intestine without perforation or abscess without bleeding: Secondary | ICD-10-CM | POA: Diagnosis present

## 2022-12-11 DIAGNOSIS — H548 Legal blindness, as defined in USA: Secondary | ICD-10-CM | POA: Diagnosis present

## 2022-12-11 DIAGNOSIS — G40909 Epilepsy, unspecified, not intractable, without status epilepticus: Secondary | ICD-10-CM | POA: Diagnosis present

## 2022-12-11 DIAGNOSIS — Z8619 Personal history of other infectious and parasitic diseases: Secondary | ICD-10-CM

## 2022-12-11 DIAGNOSIS — Z8249 Family history of ischemic heart disease and other diseases of the circulatory system: Secondary | ICD-10-CM

## 2022-12-11 DIAGNOSIS — Z5320 Procedure and treatment not carried out because of patient's decision for unspecified reasons: Secondary | ICD-10-CM | POA: Diagnosis not present

## 2022-12-11 DIAGNOSIS — Z79899 Other long term (current) drug therapy: Secondary | ICD-10-CM | POA: Diagnosis not present

## 2022-12-11 DIAGNOSIS — I5020 Unspecified systolic (congestive) heart failure: Secondary | ICD-10-CM

## 2022-12-11 DIAGNOSIS — I25118 Atherosclerotic heart disease of native coronary artery with other forms of angina pectoris: Principal | ICD-10-CM | POA: Diagnosis present

## 2022-12-11 DIAGNOSIS — Z823 Family history of stroke: Secondary | ICD-10-CM

## 2022-12-11 DIAGNOSIS — R079 Chest pain, unspecified: Secondary | ICD-10-CM | POA: Diagnosis not present

## 2022-12-11 DIAGNOSIS — E11319 Type 2 diabetes mellitus with unspecified diabetic retinopathy without macular edema: Secondary | ICD-10-CM | POA: Diagnosis not present

## 2022-12-11 LAB — GLUCOSE, CAPILLARY
Glucose-Capillary: 85 mg/dL (ref 70–99)
Glucose-Capillary: 157 mg/dL — ABNORMAL HIGH (ref 70–99)
Glucose-Capillary: 119 mg/dL — ABNORMAL HIGH (ref 70–99)

## 2022-12-11 LAB — COMPREHENSIVE METABOLIC PANEL
ALT: 12 U/L (ref 0–44)
AST: 15 U/L (ref 15–41)
Albumin: 3 g/dL — ABNORMAL LOW (ref 3.5–5.0)
Alkaline Phosphatase: 75 U/L (ref 38–126)
Anion gap: 16 — ABNORMAL HIGH (ref 5–15)
BUN: 77 mg/dL — ABNORMAL HIGH (ref 6–20)
CO2: 23 mmol/L (ref 22–32)
Calcium: 8.5 mg/dL — ABNORMAL LOW (ref 8.9–10.3)
Chloride: 98 mmol/L (ref 98–111)
Creatinine, Ser: 15.23 mg/dL — ABNORMAL HIGH (ref 0.61–1.24)
GFR, Estimated: 3 mL/min — ABNORMAL LOW (ref >60)
Glucose, Bld: 134 mg/dL — ABNORMAL HIGH (ref 70–99)
Potassium: 4.3 mmol/L (ref 3.5–5.1)
Sodium: 137 mmol/L (ref 135–145)
Total Bilirubin: 0.6 mg/dL (ref 0.3–1.2)
Total Protein: 7 g/dL (ref 6.5–8.1)

## 2022-12-11 LAB — LIPID PANEL
Cholesterol: 160 mg/dL (ref 0–200)
HDL: 31 mg/dL — ABNORMAL LOW (ref >40)
LDL Cholesterol: 107 mg/dL — ABNORMAL HIGH (ref 0–99)
Total CHOL/HDL Ratio: 5.2 RATIO
Triglycerides: 111 mg/dL (ref <150)
VLDL: 22 mg/dL (ref 0–40)

## 2022-12-11 LAB — CBC WITH DIFFERENTIAL/PLATELET
Abs Immature Granulocytes: 0.02 10*3/uL (ref 0.00–0.07)
Basophils Absolute: 0 10*3/uL (ref 0.0–0.1)
Basophils Relative: 0 %
Eosinophils Absolute: 0.3 10*3/uL (ref 0.0–0.5)
Eosinophils Relative: 4 %
HCT: 25.7 % — ABNORMAL LOW (ref 39.0–52.0)
Hemoglobin: 8.3 g/dL — ABNORMAL LOW (ref 13.0–17.0)
Immature Granulocytes: 0 %
Lymphocytes Relative: 20 %
Lymphs Abs: 1.5 10*3/uL (ref 0.7–4.0)
MCH: 31.3 pg (ref 26.0–34.0)
MCHC: 32.3 g/dL (ref 30.0–36.0)
MCV: 97 fL (ref 80.0–100.0)
Monocytes Absolute: 0.9 10*3/uL (ref 0.1–1.0)
Monocytes Relative: 13 %
Neutro Abs: 4.8 10*3/uL (ref 1.7–7.7)
Neutrophils Relative %: 63 %
Platelets: 168 10*3/uL (ref 150–400)
RBC: 2.65 MIL/uL — ABNORMAL LOW (ref 4.22–5.81)
RDW: 16.9 % — ABNORMAL HIGH (ref 11.5–15.5)
WBC: 7.6 10*3/uL (ref 4.0–10.5)
nRBC: 0 % (ref 0.0–0.2)

## 2022-12-11 LAB — IRON AND TIBC
Iron: 69 ug/dL (ref 45–182)
Saturation Ratios: 34 % (ref 17.9–39.5)
TIBC: 204 ug/dL — ABNORMAL LOW (ref 250–450)
UIBC: 135 ug/dL

## 2022-12-11 LAB — RENAL FUNCTION PANEL
Albumin: 3 g/dL — ABNORMAL LOW (ref 3.5–5.0)
Anion gap: 15 (ref 5–15)
BUN: 72 mg/dL — ABNORMAL HIGH (ref 6–20)
CO2: 24 mmol/L (ref 22–32)
Calcium: 8.4 mg/dL — ABNORMAL LOW (ref 8.9–10.3)
Chloride: 101 mmol/L (ref 98–111)
Creatinine, Ser: 15.41 mg/dL — ABNORMAL HIGH (ref 0.61–1.24)
GFR, Estimated: 3 mL/min — ABNORMAL LOW (ref >60)
Glucose, Bld: 122 mg/dL — ABNORMAL HIGH (ref 70–99)
Phosphorus: 8.5 mg/dL — ABNORMAL HIGH (ref 2.5–4.6)
Potassium: 4.6 mmol/L (ref 3.5–5.1)
Sodium: 140 mmol/L (ref 135–145)

## 2022-12-11 LAB — TROPONIN I (HIGH SENSITIVITY)
Troponin I (High Sensitivity): 435 ng/L (ref <18)
Troponin I (High Sensitivity): 428 ng/L (ref <18)

## 2022-12-11 LAB — CBC
HCT: 26.8 % — ABNORMAL LOW (ref 39.0–52.0)
Hemoglobin: 8.7 g/dL — ABNORMAL LOW (ref 13.0–17.0)
MCH: 31.5 pg (ref 26.0–34.0)
MCHC: 32.5 g/dL (ref 30.0–36.0)
MCV: 97.1 fL (ref 80.0–100.0)
Platelets: 164 10*3/uL (ref 150–400)
RBC: 2.76 MIL/uL — ABNORMAL LOW (ref 4.22–5.81)
RDW: 16.8 % — ABNORMAL HIGH (ref 11.5–15.5)
WBC: 7.4 10*3/uL (ref 4.0–10.5)
nRBC: 0 % (ref 0.0–0.2)

## 2022-12-11 LAB — ECHOCARDIOGRAM COMPLETE
Area-P 1/2: 5.31 cm2
Calc EF: 53.9 %
Height: 73 in
S' Lateral: 3.9 cm
Single Plane A2C EF: 50.7 %
Single Plane A4C EF: 52.4 %
Weight: 3858.93 oz

## 2022-12-11 LAB — POC OCCULT BLOOD, ED: Fecal Occult Bld: NEGATIVE

## 2022-12-11 LAB — HEPATITIS B SURFACE ANTIGEN: Hepatitis B Surface Ag: NONREACTIVE

## 2022-12-11 LAB — FERRITIN: Ferritin: 911 ng/mL — ABNORMAL HIGH (ref 24–336)

## 2022-12-11 LAB — LIPASE, BLOOD: Lipase: 55 U/L — ABNORMAL HIGH (ref 11–51)

## 2022-12-11 MED ORDER — ACETAMINOPHEN 325 MG PO TABS
650.0000 mg | ORAL_TABLET | Freq: Four times a day (QID) | ORAL | Status: DC | PRN
  Administered 2022-12-11 – 2022-12-12 (×2): 650 mg via ORAL
  Filled 2022-12-11 (×2): qty 2

## 2022-12-11 MED ORDER — PENTAFLUOROPROP-TETRAFLUOROETH EX AERO: 1.0000 | INHALATION_SPRAY | CUTANEOUS | Status: DC | PRN

## 2022-12-11 MED ORDER — NITROGLYCERIN 0.4 MG SL SUBL
0.4000 mg | SUBLINGUAL_TABLET | SUBLINGUAL | Status: AC | PRN
  Administered 2022-12-11 (×3): 0.4 mg via SUBLINGUAL
  Filled 2022-12-11 (×2): qty 1

## 2022-12-11 MED ORDER — LIDOCAINE-PRILOCAINE 2.5-2.5 % EX CREA: 1.0000 | TOPICAL_CREAM | CUTANEOUS | Status: DC | PRN

## 2022-12-11 MED ORDER — ROSUVASTATIN CALCIUM 5 MG PO TABS
10.0000 mg | ORAL_TABLET | Freq: Every day | ORAL | Status: DC
  Administered 2022-12-11: 10 mg via ORAL
  Filled 2022-12-11: qty 2

## 2022-12-11 MED ORDER — CHLORHEXIDINE GLUCONATE CLOTH 2 % EX PADS: 6.0000 | MEDICATED_PAD | Freq: Every day | CUTANEOUS | Status: DC

## 2022-12-11 MED ORDER — LIDOCAINE HCL (PF) 1 % IJ SOLN: 5.0000 mL | INTRAMUSCULAR | Status: DC | PRN

## 2022-12-11 MED ORDER — HEPARIN (PORCINE) 25000 UT/250ML-% IV SOLN
1300.0000 [IU]/h | INTRAVENOUS | Status: DC
  Administered 2022-12-11: 1300 [IU]/h via INTRAVENOUS
  Filled 2022-12-11: qty 250

## 2022-12-11 MED ORDER — CARVEDILOL 25 MG PO TABS
25.0000 mg | ORAL_TABLET | Freq: Two times a day (BID) | ORAL | Status: DC
  Administered 2022-12-11 – 2022-12-14 (×6): 25 mg via ORAL
  Filled 2022-12-11: qty 2
  Filled 2022-12-11 (×5): qty 1

## 2022-12-11 MED ORDER — PANTOPRAZOLE SODIUM 40 MG PO TBEC
40.0000 mg | DELAYED_RELEASE_TABLET | Freq: Every day | ORAL | Status: DC
  Administered 2022-12-11 – 2022-12-14 (×4): 40 mg via ORAL
  Filled 2022-12-11 (×4): qty 1

## 2022-12-11 MED ORDER — HEPARIN SODIUM (PORCINE) 1000 UNIT/ML DIALYSIS
1000.0000 [IU] | INTRAMUSCULAR | Status: DC | PRN
  Filled 2022-12-11: qty 1

## 2022-12-11 MED ORDER — FENTANYL CITRATE PF 50 MCG/ML IJ SOSY
100.0000 ug | PREFILLED_SYRINGE | INTRAMUSCULAR | Status: DC | PRN
  Administered 2022-12-11 (×2): 100 ug via INTRAVENOUS
  Filled 2022-12-11 (×2): qty 2

## 2022-12-11 MED ORDER — HYDROMORPHONE HCL 1 MG/ML IJ SOLN
1.0000 mg | Freq: Once | INTRAMUSCULAR | Status: AC
  Administered 2022-12-11: 1 mg via INTRAVENOUS
  Filled 2022-12-11: qty 1

## 2022-12-11 MED ORDER — CALCIUM ACETATE (PHOS BINDER) 667 MG PO CAPS
2001.0000 mg | ORAL_CAPSULE | Freq: Three times a day (TID) | ORAL | Status: DC
  Administered 2022-12-11 – 2022-12-14 (×7): 2001 mg via ORAL
  Filled 2022-12-11 (×7): qty 3

## 2022-12-11 MED ORDER — RENA-VITE PO TABS
1.0000 | ORAL_TABLET | Freq: Every day | ORAL | Status: DC
  Administered 2022-12-11 – 2022-12-14 (×4): 1 via ORAL
  Filled 2022-12-11 (×5): qty 1

## 2022-12-11 MED ORDER — IRBESARTAN 150 MG PO TABS
150.0000 mg | ORAL_TABLET | Freq: Every day | ORAL | Status: DC
  Administered 2022-12-11 – 2022-12-14 (×4): 150 mg via ORAL
  Filled 2022-12-11 (×4): qty 1

## 2022-12-11 MED ORDER — ACETAMINOPHEN 650 MG RE SUPP: 650.0000 mg | Freq: Four times a day (QID) | RECTAL | Status: DC | PRN

## 2022-12-11 MED ORDER — ANTICOAGULANT SODIUM CITRATE 4% (200MG/5ML) IV SOLN: 5.0000 mL | Status: DC | PRN

## 2022-12-11 MED ORDER — HEPARIN BOLUS VIA INFUSION
4000.0000 [IU] | Freq: Once | INTRAVENOUS | Status: AC
  Administered 2022-12-11: 4000 [IU] via INTRAVENOUS
  Filled 2022-12-11: qty 4000

## 2022-12-11 MED ORDER — GABAPENTIN 100 MG PO CAPS
100.0000 mg | ORAL_CAPSULE | Freq: Three times a day (TID) | ORAL | Status: DC
  Administered 2022-12-11 – 2022-12-14 (×9): 100 mg via ORAL
  Filled 2022-12-11 (×9): qty 1

## 2022-12-11 MED ORDER — SENNOSIDES-DOCUSATE SODIUM 8.6-50 MG PO TABS: 1.0000 | ORAL_TABLET | Freq: Every evening | ORAL | Status: DC | PRN

## 2022-12-11 MED ORDER — ALTEPLASE 2 MG IJ SOLR: 2.0000 mg | Freq: Once | INTRAMUSCULAR | Status: DC | PRN

## 2022-12-11 NOTE — H&P (Addendum)
Date: 12/11/2022               Patient Name:  Corey Park MRN: [id number]  DOB: 01/02/1971 Age / Sex: 52 y.o., male   PCP: [name], NP         Medical Service: Internal Medicine Teaching Service         Attending Physician: Dr. Mercie Eon, MD    First Contact: Dr. Modena Slater, DO Pager: [phone]  Second Contact: Dr. Champ Mungo, DO Pager: [phone]       After Hours (After 5p/  First Contact Pager: [phone]  weekends / holidays): Second Contact Pager: [phone]   Chief Complaint: Chest pain   History of Present Illness:  This is a 52 year old male with a past medical history of CAD, hypertension, type 2 diabetes, ESRD on dialysis, diabetic retinopathy who presents to the emergency department with concerns of intermittent chest pain over the past 2 days.  He states he has had this chest pain before, and has recently become more consistent.  He describes his pain to be a squeezing type pain that gets worse when he lays down.  He states it has become worse over the past 2 nights.  It does not happen during the day.  He states this pain radiates to the back of his neck.  He denies any chest pain with exertion.  Associated symptoms include lightheadedness, and feeling hot.  He also reports having some burning sensation in his left chest as well. He recently has not missed any dialysis sessions.  He has not missed any medications.  He denies any recent sick contacts.  He is unable to comment about symptoms such as hematuria, hematochezia, or melena as he is legally blind.  He has not tried any medication for the pain but he does state that when he takes his morning medications, his chest pain resolves.  He denies any changes to his bowel habits.  He denies any NSAID use.  He does report he had a stomach ulcer in the past, but other than that has not had any other abdominal concerns.  He does report that recently he has had difficulty breathing with flying, and has to fall asleep sitting  up.  He sleeps with 1 pillow under his head and legs elevated, and has not changed this recently.  He also reports 6 years of hiccups and states he vomits many times, but has not had any recent concerns about this.  ED course: Patient initially presented to the emergency department with concerns of chest pain.  Initial vital signs showed patient was afebrile with blood pressure 164/82, satting at 94% on room air.  Initial labs showed elevated troponins at 435.  Patient found to have decreased hemoglobin at 8.3, down from 11.4 one month ago. Patient was given a bolus of heparin, given fentanyl, and nitroglycerin. Cardiology was consulted, who recommended medical admission given anemia and ESRD.  IMG is consulted for admission.  Meds: Asa 81  Phoslo  Carvedilol 25 bid Gabapentin 100 tid Ferric citrate Multivitamin Olmesartan 20 daily Protonix 40 mg daily Rosuvastatin 10 mg daily  Meds:  Current Meds  Medication Sig   aspirin EC 81 MG tablet Take 1 tablet (81 mg total) by mouth daily. Swallow whole.   calcium acetate (PHOSLO) 667 MG tablet Take 2,001 mg by mouth 3 (three) times daily.   carvedilol (COREG) 25 MG tablet Take 1 tablet (25 mg total) by mouth 2 (two) times daily with  a meal.   diclofenac Sodium (VOLTAREN) 1 % GEL Apply 2 g topically 4 (four) times daily as needed (Pain).   ferric citrate (AURYXIA) 1 GM 210 MG(Fe) tablet Take 2 tablets (420 mg total) by mouth daily.   gabapentin (NEURONTIN) 100 MG capsule Take 1 capsule (100 mg total) by mouth 3 (three) times daily.   Multiple Vitamins-Minerals (CERTAVITE/ANTIOXIDANTS) TABS Take 1 tablet by mouth at bedtime.   multivitamin (RENA-VIT) TABS tablet Take 1 tablet by mouth daily.   olmesartan (BENICAR) 20 MG tablet Take 1 tablet (20 mg total) by mouth daily.   pantoprazole (PROTONIX) 40 MG tablet Take 1 tablet (40 mg total) by mouth daily.   rosuvastatin (CRESTOR) 10 MG tablet Take 1 tablet (10 mg total) by mouth daily.      Allergies: Allergies as of 12/11/2022   (No Known Allergies)   Past Medical History:  Diagnosis Date   Acute CHF (congestive heart failure) (HCC) 07/27/2019   AKI (acute kidney injury) (HCC) 12/11/2016   Allergy, unspecified, initial encounter 08/25/2019   Anemia 2021   Anesthesia of skin 03/31/2020   Asthma    as a child   Blind    Cellulitis, perineum 11/26/2019   CHF (congestive heart failure) (HCC) 2021   Chronic kidney disease, stage V (HCC) 06/17/2018   Chronic kidney disease, stage V (HCC) 06/17/2018   Complication of vascular dialysis catheter 08/25/2019   COVID-19 virus infection 07/27/2019   Diabetes mellitus without complication (HCC)    type 2   Dilated cardiomyopathy (HCC) 07/03/2018   Elevated troponin    Encounter for removal of sutures 08/04/2020   Fe deficiency anemia 12/23/2018   Fluid overload 12/11/2016   Hypertension    Hypertensive urgency 12/11/2016   Hypomagnesemia 12/13/2016   Legally blind    B/L   Pain, unspecified 10/17/2019   Pneumonia 2021   Renal disorder    dialysis T-TH-Sat   Shortness of breath 11/10/2019   Symptomatic anemia 12/21/2018   Type 2 diabetes mellitus with diabetic peripheral angiopathy without gangrene (HCC) 08/25/2019   Unspecified protein-calorie malnutrition (HCC) 08/25/2019    Family History:  Extensive history of type 2 diabetes and grandmother, and cousin Mother with history of stroke  Social History:  Independent in all ADLs, but needs some assistance with IADLs Lives with: Son, and sometimes alone  Support: Good support at home 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 Claudean Severance  Review of Systems: A complete ROS was negative except as per HPI.   Physical Exam: Blood pressure (!) 158/89, pulse 73, temperature 98.2 F (36.8 C), resp. rate 16, height 6\' 1"  (1.854 m), weight 99.8 kg, SpO2 100 %. General: Patient is resting comfortably in bed in no acute  distress  Head: Normocephalic, atraumatic  Neck: No JVD Cardio: Regular rate and rhythm, no murmurs, rubs or gallops. 2+ pulses to bilateral upper and lower extremities  Pulmonary: Clear to ausculation bilaterally with no rales, rhonchi, and crackles  Abdomen: Soft, nontender with normoactive bowel sounds with no rebound or guarding  Extremities: No pitting edema appreciated to bilateral lower extremities  Studies: EKG: personally reviewed my interpretation is normal sinus rhythm.  Normal axis.  There is some ST segment depression noted in V5, V6.  There is some ST segment elevation in V1.  Seems to be nonspecific.  When compared to previous ECG, there is ST segment depressions in V5, V6 were present on previous as well.  CXR: personally reviewed my interpretation is pulmonary vascular congestion with enlargement of the cardiopericardial silhouette; bibasilar atelectasis.  Assessment & Plan by Problem: Principal Problem:   Atypical chest pain Active Problems:   DM (diabetes mellitus), type 2 (HCC)   HLD (hyperlipidemia)   Elevated troponin   ESRD on dialysis (HCC)  #Atypical chest pain #Hx CAD #Elevated troponin Patient presents with 2 days of L-sided chest pain that radiates to the back of his neck, only occurring at night when he lays down. He has also had a burning sensation. He has several recent presentations to ED for chest pain. It does not occur with exertion. He is unsure if the onset of pain is related to need for HD. Cardiac work-up previously showed medium defect of severe reduction in uptake in the apical to basal inferior location noted on Lexiscan on February 2024. At that time, patient was recommended to continue medical management as no location to intervene on.  They did mention if patient continues to have symptoms, patient could potentially get a cardiac catheterization.  EKG today showing nonspecific ST segment changes, with T wave inversions in lead V5 and V6.    Troponins 435>428 (historically mildly elevated in 70s-80s). STEMI was ruled out. Differential is broad but includes NSTEMI, pericarditis, GERD, unstable angina demand ischemia. I have lower suspicion for MSK cause of the pain, do not suspect PE.  Lower suspicion for heart failure exacerbation. Patient did receive heparin bolus in the ED but was stopped by cardiology.  -Cardiology consulted, appreciate their recommendations -Lipid panel pending -Continue home rosuvastatin 10 mg daily -Follow-up echo -Monitor on telemetry  #Acute on chronic anemia of chronic kidney disease Patient with history of H. Pylori gastritis, noted on EGD in 2022. He also underwent colonoscopy at this time which showed diverticulosis of the sigmoid colon, as well as lipoma in the transverse colon.  Patient also had internal hemorrhoids that were nonbleeding.[name] He does not remember why he had these procedures done. He was given treatment for gastritis at that time and endorses continued use of daily protonix. He has frequent vomiting due to intractable hiccups however due to legal blindness he is unable to say whether or not he has had evidence of blood in his vomitus. He is also unable to comment on hematochezia or melena. He denies increased frequency of bowel movements. Last iron studies in 2021. FOBT in ED negative. Hgb is 8.3 on admission. Possibly UGIB given history of H. Pylori gastritis and frequent vomiting due to intractable hiccups though no clear signs of bleed at this time.  Other etiology could be diverticular bleed, but less likely.  Patient did also have hemorrhoidal bleeding.  Less likely as well, as occult test was negative.  Patient does take [place] daily which patient has been compliant with.  I do think if this is a bleed, this is a slow bleed as patient seems to be compensating well as patient is not tachycardic, not hypotensive, not hypoxic. -Trend CBC, transfuse for Hgb <7 -Iron studies today, may be good  candidate for EPO if not already receiving this with HD as OP -Patient does start having signs of bleeding, obtain stat H&H  #ESRD on MWF HD Last HD session Friday, denies missing any recent sessions. Renal function today consistent with need for HD per regular schedule. Patient endorses urine production. -Nephrology consulted, will plan on HD while inpatient per nephrology -Continue phoslo, renavite  #HFrEF, LF EV 55% Not overtly volume overloaded. CXR showed pulmonary vascular  congestion with enlargement of the cardiopericardial silhouette. Last echocardiogram 03/2022 with LF EF 55% and regional wall motion abnormalities, mild concentric LVH, consistent with grade II diastolic dysfunction; normal RV function; trivial MV regurgitation. He does endorse orthopnea, stating he normally uses at least one pillow under his head at night and keeps his LE elevated. Recently he has needed to fall asleep sitting up due to orthopnea.  Patient will get dialysis today.  Do not see indication to start diuresing patient.  Patient is not hypoxic.  Will continue to monitor volume status while inpatient. -Repeat echocardiogram pending  -Continue coreg 25 mg BID  -Continue home irbesartan 150 mg daily  -Continue to monitor volume status  #HTN Mildly hypertensive on admission.  -Continue home carvedilol 25 mg BID, irbesartan 150 mg daily  #T2DM Last HbA1c 5.6% 08/2022. Not on any outpatient therapy. -Continue to monitor BMP   #Legal blindness Underling etiology secondary to diabetic retinopathy.  -No additional management  #Intractable hiccups Patient has a history of intractable epilepsy, he states that he has 12 days of hiccups, and then 1 week of no hiccups, and it has been a cycle.  Patient has been tried on gabapentin for these hiccups. -Continue gabapentin for now  Dispo: Admit patient to Inpatient with expected length of stay greater than 2 midnights.  Signed: Modena Slater, DO PGY-1 12/11/2022,  11:04 AM  After 5pm on weekdays and 1pm on weekends: On Call pager: [phone]

## 2022-12-11 NOTE — ED Notes (Signed)
ED TO INPATIENT HANDOFF REPORT  ED Nurse Name and Phone #: Vernona Rieger [phone]  S Name/Age/Gender Corey Park 52 y.o. male Room/Bed: 036C/036C  Code Status   Code Status: Full Code  Home/SNF/Other Home Patient oriented to: self, place, time, and situation Is this baseline? Yes   Triage Complete: Triage complete  Chief Complaint Atypical chest pain [R07.89]  Triage Note Chief Complaint  Patient presents with   Chest Pain   Pt presents to ED 31 via EMS from home with above complaint.  Reports "squeezing" chest pain to left side for 2 days; states it got worse this morning.  Denies nausea/vomiting.  Had slight headache that resolved en route.  Pt given 324 mg Aspirin en route.  Pt gets dialysis MWF; last treatment on Friday.  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   Allergies No Known Allergies  Level of Care/Admitting Diagnosis ED Disposition     ED Disposition  Admit   Condition  --   Comment  Hospital Area: [facility] [[id number]]  Level of Care: Telemetry Cardiac [[id number]]  May admit patient to [facility] or [facility] if equivalent level of care is available:: No  Covid Evaluation: Asymptomatic - no recent exposure (last 10 days) testing not required  Diagnosis: Atypical chest pain [297556]  Admitting Physician: Mercie Eon [[id number]]  Attending Physician: Mercie Eon [[id number]]  Certification:: I certify this patient will need inpatient services for at least 2 midnights  Estimated Length of Stay: 2          B Medical/Surgery History Past Medical History:  Diagnosis Date   Acute CHF (congestive heart failure) (HCC) 07/27/2019   AKI (acute kidney injury) (HCC) 12/11/2016   Allergy, unspecified, initial encounter 08/25/2019   Anemia 2021   Anesthesia of skin 03/31/2020   Asthma    as a child   Blind    Cellulitis, perineum 11/26/2019   CHF (congestive heart failure) (HCC) 2021   Chronic kidney disease, stage V (HCC) 06/17/2018   Chronic kidney  disease, stage V (HCC) 06/17/2018   Complication of vascular dialysis catheter 08/25/2019   COVID-19 virus infection 07/27/2019   Diabetes mellitus without complication (HCC)    type 2   Dilated cardiomyopathy (HCC) 07/03/2018   Elevated troponin    Encounter for removal of sutures 08/04/2020   Fe deficiency anemia 12/23/2018   Fluid overload 12/11/2016   Hypertension    Hypertensive urgency 12/11/2016   Hypomagnesemia 12/13/2016   Legally blind    B/L   Pain, unspecified 10/17/2019   Pneumonia 2021   Renal disorder    dialysis T-TH-Sat   Shortness of breath 11/10/2019   Symptomatic anemia 12/21/2018   Type 2 diabetes mellitus with diabetic peripheral angiopathy without gangrene (HCC) 08/25/2019   Unspecified protein-calorie malnutrition (HCC) 08/25/2019   Past Surgical History:  Procedure Laterality Date   BASCILIC VEIN TRANSPOSITION Right 10/16/2019   Procedure: Basilic Vein Transposition Right Arm;  Surgeon: Nada Libman, MD;  [facility] OR;  Service: Vascular;  Laterality: Right;   BASCILIC VEIN TRANSPOSITION Right 01/29/2020   Procedure: RIGHT ARM SECOND STAGE BASCILIC VEIN TRANSPOSITION;  Surgeon: Nada Libman, MD;  Location: [facility] OR;  Service: Vascular;  Laterality: Right;   BIOPSY  12/22/2018   Procedure: BIOPSY;  Surgeon: Carman Ching, MD;  Location: [facility] ENDOSCOPY;  Service: Endoscopy;;   ESOPHAGOGASTRODUODENOSCOPY N/A 12/22/2018   Procedure: ESOPHAGOGASTRODUODENOSCOPY (EGD);  Surgeon: Carman Ching, MD;  Location: [facility] ENDOSCOPY;  Service: Endoscopy;  Laterality: N/A;  IR FLUORO GUIDE CV LINE RIGHT  07/29/2019   IR US GUIDE VASC ACCESS RIGHT  07/29/2019     A IV Location/Drains/Wounds Patient Lines/Drains/Airways Status     Active Line/Drains/Airways     Name Placement date Placement time Site Days   Peripheral IV 12/11/22 Anterior;Left;Proximal Forearm 12/11/22  0447  Forearm  less than 1   Peripheral IV 12/11/22 20 G Anterior;Left Forearm 12/11/22  0626  Forearm  less than 1    Fistula / Graft Right Upper arm Arteriovenous fistula 10/16/19  0833  Upper arm  1152   Wound / Incision (Open or Dehisced) 10/19/22 Laceration Lip Right 10/19/22  2100  Lip  53            Intake/Output Last 24 hours No intake or output data in the 24 hours ending 12/11/22 1113  Labs/Imaging Results for orders placed or performed during the hospital encounter of 12/11/22 (from the past 48 hour(s))  CBC with Differential     Status: Abnormal   Collection Time: 12/11/22  4:41 AM  Result Value Ref Range   WBC 7.6 4.0 - 10.5 K/uL   RBC 2.65 (L) 4.22 - 5.81 MIL/uL   Hemoglobin 8.3 (L) 13.0 - 17.0 g/dL   HCT 21.3 (L) 08.6 - 57.8 %   MCV 97.0 80.0 - 100.0 fL   MCH 31.3 26.0 - 34.0 pg   MCHC 32.3 30.0 - 36.0 g/dL   RDW 46.9 (H) 62.9 - 52.8 %   Platelets 168 150 - 400 K/uL   nRBC 0.0 0.0 - 0.2 %   Neutrophils Relative % 63 %   Neutro Abs 4.8 1.7 - 7.7 K/uL   Lymphocytes Relative 20 %   Lymphs Abs 1.5 0.7 - 4.0 K/uL   Monocytes Relative 13 %   Monocytes Absolute 0.9 0.1 - 1.0 K/uL   Eosinophils Relative 4 %   Eosinophils Absolute 0.3 0.0 - 0.5 K/uL   Basophils Relative 0 %   Basophils Absolute 0.0 0.0 - 0.1 K/uL   Immature Granulocytes 0 %   Abs Immature Granulocytes 0.02 0.00 - 0.07 K/uL    Comment: Performed at [facility], [address] [place], [place] [address]  Comprehensive metabolic panel     Status: Abnormal   Collection Time: 12/11/22  4:41 AM  Result Value Ref Range   Sodium 137 135 - 145 mmol/L   Potassium 4.3 3.5 - 5.1 mmol/L   Chloride 98 98 - 111 mmol/L   CO2 23 22 - 32 mmol/L   Glucose, Bld 134 (H) 70 - 99 mg/dL    Comment: Glucose reference range applies only to samples taken after fasting for at least 8 hours.   BUN 77 (H) 6 - 20 mg/dL   Creatinine, Ser 40.10 (H) 0.61 - 1.24 mg/dL   Calcium 8.5 (L) 8.9 - 10.3 mg/dL   Total Protein 7.0 6.5 - 8.1 g/dL   Albumin 3.0 (L) 3.5 - 5.0 g/dL   AST 15 15 - 41 U/L   ALT 12 0 - 44 U/L   Alkaline  Phosphatase 75 38 - 126 U/L   Total Bilirubin 0.6 0.3 - 1.2 mg/dL   GFR, Estimated 3 (L) >60 mL/min    Comment: (NOTE) Calculated using the CKD-EPI Creatinine Equation (2021)    Anion gap 16 (H) 5 - 15    Comment: Performed at [facility], [address], [place], [place] [address]  Troponin I (High Sensitivity)  Status: Abnormal   Collection Time: 12/11/22  4:41 AM  Result Value Ref Range   Troponin I (High Sensitivity) 435 (HH) <18 ng/L    Comment: CRITICAL RESULT CALLED TO, READ BACK BY AND VERIFIED WITH H. STEWART, EMTP. [id number] 12/11/22. LPAIT (NOTE) Elevated high sensitivity troponin I (hsTnI) values and significant  changes across serial measurements may suggest ACS but many other  chronic and acute conditions are known to elevate hsTnI results.  Refer to the "Links" section for chest pain algorithms and additional  guidance. Performed at [facility], [address] [place], [place] [address]   Lipase, blood     Status: Abnormal   Collection Time: 12/11/22  4:41 AM  Result Value Ref Range   Lipase 55 (H) 11 - 51 U/L    Comment: Performed at [facility], [address], [place], [place] [address]  POC occult blood, ED     Status: None   Collection Time: 12/11/22  6:32 AM  Result Value Ref Range   Fecal Occult Bld NEGATIVE NEGATIVE  Troponin I (High Sensitivity)     Status: Abnormal   Collection Time: 12/11/22  6:57 AM  Result Value Ref Range   Troponin I (High Sensitivity) 428 (HH) <18 ng/L    Comment: CRITICAL VALUE NOTED. VALUE IS CONSISTENT WITH PREVIOUSLY REPORTED/CALLED VALUE (NOTE) Elevated high sensitivity troponin I (hsTnI) values and significant  changes across serial measurements may suggest ACS but many other  chronic and acute conditions are known to elevate hsTnI results.  Refer to the "Links" section for chest pain algorithms and additional  guidance. Performed at [facility], [address], [place], [place] [address]     DG Chest Portable 1 View  Result Date: 12/11/2022 CLINICAL DATA:  Fever and chest pain. EXAM: PORTABLE CHEST 1 VIEW COMPARISON:  10/19/2022 FINDINGS: The cardio pericardial silhouette is enlarged. There is pulmonary vascular congestion without overt pulmonary edema. Basilar atelectasis with no substantial pleural effusion. No acute bony abnormality. Telemetry leads overlie the chest. IMPRESSION: 1. Enlargement of the cardiopericardial silhouette with pulmonary vascular congestion. 2. Basilar atelectasis. Electronically Signed   By: Kennith Center M.D.   On: 12/11/2022 06:08    Pending Labs Unresulted Labs (From admission, onward)     Start     Ordered   12/12/22 0500  CBC  Daily,   R     See Hyperspace for full Linked Orders Report.   12/11/22 0622   12/12/22 0500  Renal function panel  Tomorrow morning,   R        12/11/22 0959   12/11/22 1037  Lipid panel  Once,   R        12/11/22 1036   12/11/22 1021  Ferritin  Once,   R        12/11/22 1021   12/11/22 1021  Iron and TIBC  Once,   R        12/11/22 1021            Vitals/Pain Today's Vitals   12/11/22 1000 12/11/22 1015 12/11/22 1100 12/11/22 1110  BP: (!) 161/97 (!) 170/77  (!) 181/112  Pulse: 73 79  68  Resp: 14 17  13   Temp:      TempSrc:      SpO2: (!) 86% 100%  90%  Weight:      Height:      PainSc:   10-Worst pain ever     Isolation Precautions  No active isolations  Medications Medications  fentaNYL (SUBLIMAZE) injection 100 mcg (100 mcg Intravenous Given 12/11/22 1108)  acetaminophen (TYLENOL) tablet 650 mg (650 mg Oral Given 12/11/22 1103)    Or  acetaminophen (TYLENOL) suppository 650 mg ( Rectal See Alternative 12/11/22 1103)  senna-docusate (Senokot-S) tablet 1 tablet (has no administration in time range)  rosuvastatin (CRESTOR) tablet 10 mg (10 mg Oral Given 12/11/22 1103)  pantoprazole (PROTONIX) EC tablet 40 mg (40 mg Oral Given 12/11/22 1103)  irbesartan (AVAPRO) tablet 150 mg (150 mg Oral Given  12/11/22 1103)  carvedilol (COREG) tablet 25 mg (25 mg Oral Given 12/11/22 1103)  gabapentin (NEURONTIN) capsule 100 mg (has no administration in time range)  calcium acetate (PHOSLO) capsule 2,001 mg (has no administration in time range)  multivitamin (RENA-VIT) tablet 1 tablet (has no administration in time range)  nitroGLYCERIN (NITROSTAT) SL tablet 0.4 mg (0.4 mg Sublingual Given 12/11/22 0617)  heparin bolus via infusion 4,000 Units (4,000 Units Intravenous Bolus from Bag 12/11/22 0636)  HYDROmorphone (DILAUDID) injection 1 mg (1 mg Intravenous Given 12/11/22 0732)    Mobility walks     Focused Assessments Cardiac Assessment Handoff:  Cardiac Rhythm: Normal sinus rhythm (HR 71) Lab Results  Component Value Date   TROPONINI 0.18 (HH) 06/17/2018   Lab Results  Component Value Date   DDIMER 1.42 (H) 08/03/2019   Does the Patient currently have chest pain? Yes    R Recommendations: See Admitting Provider Note  Report given to:   Additional Notes: Legally blind. Tap on the bed to wake him because he startles easily.

## 2022-12-11 NOTE — ED Provider Notes (Signed)
[facility] Provider Note   CSN: [id number] Arrival date & time: 12/11/22  8657     History  Chief Complaint  Patient presents with   Chest Pain    Corey Park is a 52 y.o. male.  52 yo M here with chest pain. H/o ESRD on dialysis, hypertension. Myoview in February was intermediate risk, medically managed. Has had intermittent L chest pressure for a couple days but more severe and persistent at this time. Some dyspnea and fatigue but no other concurrent complaints. ASA PTA, still with pain. No recent fevers/cough/trauma. Compliant with dialysis.    Chest Pain      Home Medications Prior to Admission medications   Medication Sig Start Date End Date Taking? Authorizing Provider  aspirin EC 81 MG tablet Take 1 tablet (81 mg total) by mouth daily. Swallow whole. 10/20/22   Tiffany Kocher, DO  calcium acetate (PHOSLO) 667 MG capsule Take 2 capsules (1,334 mg total) by mouth with breakfast, with lunch, and with evening meal. 10/20/22   Tiffany Kocher, DO  carvedilol (COREG) 25 MG tablet Take 1 tablet (25 mg total) by mouth 2 (two) times daily with a meal. 10/20/22   Tiffany Kocher, DO  diclofenac Sodium (VOLTAREN) 1 % GEL Apply 2 g topically 4 (four) times daily as needed (Pain). 10/20/22   Tiffany Kocher, DO  ferric citrate (AURYXIA) 1 GM 210 MG(Fe) tablet Take 2 tablets (420 mg total) by mouth daily. 10/20/22   Tiffany Kocher, DO  gabapentin (NEURONTIN) 100 MG capsule Take 1 capsule (100 mg total) by mouth 3 (three) times daily. 10/20/22   Tiffany Kocher, DO  Multiple Vitamins-Minerals (CERTAVITE/ANTIOXIDANTS) TABS Take 1 tablet by mouth at bedtime. 10/20/22   Tiffany Kocher, DO  olmesartan (BENICAR) 20 MG tablet Take 1 tablet (20 mg total) by mouth daily. 10/20/22   Tiffany Kocher, DO  pantoprazole (PROTONIX) 40 MG tablet Take 1 tablet (40 mg total) by mouth daily. 10/20/22 11/19/22  Tiffany Kocher, DO  rosuvastatin (CRESTOR) 10 MG  tablet Take 1 tablet (10 mg total) by mouth daily. 10/20/22   Tiffany Kocher, DO      Allergies    Patient has no known allergies.    Review of Systems   Review of Systems  Cardiovascular:  Positive for chest pain.    Physical Exam Updated Vital Signs BP (!) 164/82   Pulse 92   Temp 98.4 F (36.9 C) (Oral)   Resp (!) 22   Ht 6\' 1"  (1.854 m)   Wt 99.8 kg   SpO2 99%   BMI 29.03 kg/m  Physical Exam Vitals and nursing note reviewed.  Constitutional:      Appearance: He is well-developed.  HENT:     Head: Normocephalic and atraumatic.  Cardiovascular:     Rate and Rhythm: Normal rate.  Pulmonary:     Effort: Pulmonary effort is normal. No respiratory distress.  Abdominal:     General: There is no distension.  Musculoskeletal:        General: Normal range of motion.     Cervical back: Normal range of motion.     Right lower leg: No edema.     Left lower leg: No edema.  Neurological:     General: No focal deficit present.     Mental Status: He is alert.  Psychiatric:        Mood and Affect: Mood normal.     ED Results / Procedures / Treatments  Labs (all labs ordered are listed, but only abnormal results are displayed) Labs Reviewed  CBC WITH DIFFERENTIAL/PLATELET - Abnormal; Notable for the following components:      Result Value   RBC 2.65 (*)    Hemoglobin 8.3 (*)    HCT 25.7 (*)    RDW 16.9 (*)    All other components within normal limits  COMPREHENSIVE METABOLIC PANEL - Abnormal; Notable for the following components:   Glucose, Bld 134 (*)    BUN 77 (*)    Creatinine, Ser 15.23 (*)    Calcium 8.5 (*)    Albumin 3.0 (*)    GFR, Estimated 3 (*)    Anion gap 16 (*)    All other components within normal limits  LIPASE, BLOOD - Abnormal; Notable for the following components:   Lipase 55 (*)    All other components within normal limits  TROPONIN I (HIGH SENSITIVITY) - Abnormal; Notable for the following components:   Troponin I (High Sensitivity) 435  (*)    All other components within normal limits    EKG None  Radiology No results found.  Procedures .Critical Care  Performed by: Marily Memos, MD Authorized by: Marily Memos, MD   Critical care provider statement:    Critical care time (minutes):  30   Critical care was necessary to treat or prevent imminent or life-threatening deterioration of the following conditions:  Cardiac failure   Critical care was time spent personally by me on the following activities:  Development of treatment plan with patient or surrogate, discussions with consultants, evaluation of patient's response to treatment, examination of patient, ordering and review of laboratory studies, ordering and review of radiographic studies, ordering and performing treatments and interventions, pulse oximetry, re-evaluation of patient's condition and review of old charts     Medications Ordered in ED Medications - No data to display  ED Course/ Medical Decision Making/ A&P                             Medical Decision Making Amount and/or Complexity of Data Reviewed Labs: ordered. Radiology: ordered.  Risk Prescription drug management. Decision regarding hospitalization.  Troponin significantly elevated compared to normal. Type II vs NSTEMI of other etiology. Also has a new anemia since April, hemocult negative, patient has not noticed any bleeding but is blind in both eyes. NTG didn't help pain. Heparin started.  Fentanyl didn't help pain, will try dilaudid. Cards to see but 2/2 anemia and ESRD, prefers medicine admission. Discussed with family medicine who states he has gotten a new PCP since last discharge so will be unassigned.      Final Clinical Impression(s) / ED Diagnoses Final diagnoses:  None    Rx / DC Orders ED Discharge Orders     None         [facility], Barbara Cower, MD 12/11/22 [id number]

## 2022-12-11 NOTE — ED Notes (Signed)
Chewing ice

## 2022-12-11 NOTE — ED Notes (Signed)
Pt noted to be desating while sleeping.  2lpm via [place] administered.

## 2022-12-11 NOTE — Hospital Course (Addendum)
This is a 52 year old male with past medical history of hypertension, type 2 diabetes, ESRD on dialysis Monday, Wednesday, Friday who presents for concerns of atypical chest pain found to have hemoglobin patient admitted for further evaluation and management of atypical chest pain as well as acute on chronic anemia of chronic kidney disease.   #Atypical chest pain #Hx CAD #Elevated troponin Patient initially presented to the emergency department with concerns of nocturnal chest pain.  He described it as a squeezing sensation that was worse when he laid down.  He denied any exertional chest pain.  Patient also had elevated troponin in the 400s.  Given this concern, patient was admitted for further evaluation management.  Initially heparin was started, but discontinued.  There was some concern about pericarditis that was less likely.  Patient repeat echo which did not show any new wall motion abnormalities or other concerns.  Urology follow-up, who catheterized the patient during hospitalization which showed ***.  Repeat cholesterol panel showing elevated LDL, and patient was transition from Crestor to atorvastatin 80 mg daily.  Patient continued aspirin.  Patient discharged on atorvastatin milligrams daily and aspirin 81 mg daily.  #Acute on chronic anemia of chronic kidney disease, stabilized On presentation to the emergency department, patient was found to have decreased hemoglobin from baseline at 8.3.  Patient is unable to comment on if he has not been having any melanotic stools or any blood per rectum as he is blind.  During hospitalization hemoglobin remained stable.  Thought was this was likely due to dilution.  Patient had normal bowel movements during hospitalization.   #ESRD on MWF HD During hospitalization patient received dialysis per nephrology.  On discharge, patient can continue dialysis outpatient.   #HFrecEF During hospitalization, repeat echo did show ejection fraction of 60 to 65%.   No new wall motion abnormalities.  No acute concern for exacerbation during hospitalization.  Patient continued Coreg and home ARB and discharged on the same.     #HTN During hospitalization, patient did have elevated blood pressures with systolics into the 200s.  Patient was resumed on home carvedilol and home ARB. Did not titrate blood pressure medication in hospital as patient is on dialysis, and with dialysis blood pressure did come back to within normal limits.    #T2DM Last HbA1c 5.6% 08/2022. Not on any outpatient therapy.  Glucose measured within normal limits during hospitalization.   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  No acute concerns during hospitalization.  A.  Atypical chest pain:  B.  Hypertension:  C.  Anemia of chronic disease:   Mr. Reynold Schlag, It was a pleasure taking care of you at [facility]. You were admitted for chest pain and had a catheterization which showed *** We are discharging you home now that you are doing better. Please follow the following instructions.   1) Regarding your chest pain, we think this is more related to***. You had a catheterization that showed***.  Please continue taking***when you are discharged.  2) Please follow-up with your primary care physician in 1 week for hospital follow-up.  3) Do not miss any dialysis sessions.  Please go regularly to your sessions.  4) If you develop chest pain that does not resolve, with associated shortness of breath please return back to the emergency department.  Take care,  Dr. Modena Slater, DO

## 2022-12-11 NOTE — ED Notes (Signed)
Pt had no relief with first . BP rechecked before administration of second nitroglycerin.

## 2022-12-11 NOTE — ED Notes (Addendum)
Intermittently sleeping. Arousable to voice. Otherwise sleep apnea evident, returned to O2, [place] reconnected top O2 placed on [place] 4L

## 2022-12-11 NOTE — Consult Note (Addendum)
Cardiology Consultation   Patient ID: Corey Park MRN: [id number]; DOB: Nov 23, 1970  Admit date: 12/11/2022 Date of Consult: 12/11/2022  PCP:  [name], NP   [facility] Providers Cardiologist:  Donato Schultz, MD        Patient Profile:   Corey Park is a 52 y.o. male with a hx of ESRD HD MWF, chronic systolic CHF with improved EF (EF improved from 25% to 55%), hypertension, DM2, legally blind, intractable hiccups and history of syncope who is being seen 12/11/2022 for the evaluation of chest pain at the request of chest pain.  History of Present Illness:   Corey Park is a 52 year old male with past medical history of ESRD HD MWF, chronic systolic CHF with improved EF (EF improved from 25% to 55%), hypertension, DM2, legally blind, intractable hiccups and history of syncope.  Echocardiogram in 2018 showed normal EF.  He was seen for chronic systolic and diastolic heart failure in the setting of uncontrolled hypertension and medication noncompliance in 2019, EF down to 25 to 30%.  Cardiac catheterization was not pursued due to advanced chronic kidney disease at the time to avoid dialysis.  He had COVID-19 infection in 2021 complicated by CHF exacerbation and CKD progression.  Echocardiogram in February 2021 showed EF 20 to 25%, moderately elevated PA systolic pressure.  Troponin was 300 at the time which was felt to be due to demand ischemia.  He was started on hemodialysis in 2021 and was placed on GDMT including Toprol-XL, hydralazine and Imdur.  He stopped Imdur later.  Echocardiogram obtained in July 2022 showed EF improved to 40 to 45%.  Most recent echocardiogram obtained in October 2023 demonstrated EF has improved to 55%, regional wall motion normality included basal inferior hypokinesis, grade 2 DD, trivial MR.  In 2024, patient has been admitted in February, March, and again in April was recurrent chest discomfort.  Myoview obtained on 08/17/2022 showed EF 42%,  medium defect of severe reduction in uptake present in the apical to basal inferior location which is fixed, no reversible ischemia was seen.  The study was intermediate risk due to infarction.  Patient was last seen in the hospital by Dr. Eldridge Dace and Dr. Anne Fu during hospitalization in March 2024, his chest pain was felt to be atypical at the time.  Systolic blood pressure was elevated up to 260s at home in the setting of medication noncompliance.  Home blood pressure medications were resumed.  Patient was readmitted in April 2024 with an episode of syncope after sitting out in the hot sun.  This was attributed to dehydration and poor oral intake.  He also describes some chest discomfort at the time however this appears to be chronic.  He presented to the [facility] ED in the early morning of 12/11/2022.  He says he has not been able to sleep much for the past 2 days due to worsening left-sided chest discomfort.  Initially he described constant chest pain, however later said it was intermittent and mainly occurring when he is laying down at night.  He also described shortness of breath that is worse when he is laying down and better when he is sitting up.  He says he has been compliant with his medications and dialysis sessions.  Last dialysis session was last Friday, the next dialysis session was supposed to be this afternoon.  He says he usually gets dialysis after work around 4 PM.  Systolic blood pressure in the ED has been in the 150s  to 160s range.  While in the emergency room, he continued to have chest discomfort.  He describes the symptom as a squeezing sensation under the left breast.  There is a focal area of tenderness lateral to the left breast.  Symptom is worse with palpation and deep inspiration.  Cardiology service consulted for chest pain with elevated troponin.  Serial troponin 435--> 428.  CBC shows hemoglobin was down to 8.3 this morning, from the previous 11.3 and 13.1 a month ago.  Since  he is blind, he is unable to tell me if he has any blood in the urine or stool.  Lipase was borderline elevated at 55.  Creatinine 15.23.  Sodium and potassium normal.   Past Medical History:  Diagnosis Date   Acute CHF (congestive heart failure) (HCC) 07/27/2019   AKI (acute kidney injury) (HCC) 12/11/2016   Allergy, unspecified, initial encounter 08/25/2019   Anemia 2021   Anesthesia of skin 03/31/2020   Asthma    as a child   Blind    Cellulitis, perineum 11/26/2019   CHF (congestive heart failure) (HCC) 2021   Chronic kidney disease, stage V (HCC) 06/17/2018   Chronic kidney disease, stage V (HCC) 06/17/2018   Complication of vascular dialysis catheter 08/25/2019   COVID-19 virus infection 07/27/2019   Diabetes mellitus without complication (HCC)    type 2   Dilated cardiomyopathy (HCC) 07/03/2018   Elevated troponin    Encounter for removal of sutures 08/04/2020   Fe deficiency anemia 12/23/2018   Fluid overload 12/11/2016   Hypertension    Hypertensive urgency 12/11/2016   Hypomagnesemia 12/13/2016   Legally blind    B/L   Pain, unspecified 10/17/2019   Pneumonia 2021   Renal disorder    dialysis T-TH-Sat   Shortness of breath 11/10/2019   Symptomatic anemia 12/21/2018   Type 2 diabetes mellitus with diabetic peripheral angiopathy without gangrene (HCC) 08/25/2019   Unspecified protein-calorie malnutrition (HCC) 08/25/2019    Past Surgical History:  Procedure Laterality Date   BASCILIC VEIN TRANSPOSITION Right 10/16/2019   Procedure: Basilic Vein Transposition Right Arm;  Surgeon: Nada Libman, MD;  Location: [facility] OR;  Service: Vascular;  Laterality: Right;   BASCILIC VEIN TRANSPOSITION Right 01/29/2020   Procedure: RIGHT ARM SECOND STAGE BASCILIC VEIN TRANSPOSITION;  Surgeon: Nada Libman, MD;  Location: [facility] OR;  Service: Vascular;  Laterality: Right;   BIOPSY  12/22/2018   Procedure: BIOPSY;  Surgeon: Carman Ching, MD;  Location: [facility] ENDOSCOPY;  Service: Endoscopy;;    ESOPHAGOGASTRODUODENOSCOPY N/A 12/22/2018   Procedure: ESOPHAGOGASTRODUODENOSCOPY (EGD);  Surgeon: Carman Ching, MD;  Location[facility] ENDOSCOPY;  Service: Endoscopy;  Laterality: N/A;   IR FLUORO GUIDE CV LINE RIGHT  07/29/2019   IR US GUIDE VASC ACCESS RIGHT  07/29/2019     Home Medications:  Prior to Admission medications   Medication Sig Start Date End Date Taking? Authorizing Provider  aspirin EC 81 MG tablet Take 1 tablet (81 mg total) by mouth daily. Swallow whole. 10/20/22  Yes Tiffany Kocher, DO  calcium acetate (PHOSLO) 667 MG tablet Take 2,001 mg by mouth 3 (three) times daily. 11/02/22  Yes [provider]  carvedilol (COREG) 25 MG tablet Take 1 tablet (25 mg total) by mouth 2 (two) times daily with a meal. 10/20/22  Yes Tiffany Kocher, DO  diclofenac Sodium (VOLTAREN) 1 % GEL Apply 2 g topically 4 (four) times daily as needed (Pain). 10/20/22  Yes Tiffany Kocher, DO  ferric citrate (AURYXIA) 1 GM 210  MG(Fe) tablet Take 2 tablets (420 mg total) by mouth daily. 10/20/22  Yes Tiffany Kocher, DO  gabapentin (NEURONTIN) 100 MG capsule Take 1 capsule (100 mg total) by mouth 3 (three) times daily. 10/20/22  Yes Tiffany Kocher, DO  Multiple Vitamins-Minerals (CERTAVITE/ANTIOXIDANTS) TABS Take 1 tablet by mouth at bedtime. 10/20/22  Yes Tiffany Kocher, DO  multivitamin (RENA-VIT) TABS tablet Take 1 tablet by mouth daily. 11/22/22  Yes [provider]  olmesartan (BENICAR) 20 MG tablet Take 1 tablet (20 mg total) by mouth daily. 10/20/22  Yes Tiffany Kocher, DO  pantoprazole (PROTONIX) 40 MG tablet Take 1 tablet (40 mg total) by mouth daily. 10/20/22 12/11/22 Yes Tiffany Kocher, DO  rosuvastatin (CRESTOR) 10 MG tablet Take 1 tablet (10 mg total) by mouth daily. 10/20/22  Yes Tiffany Kocher, DO  calcium acetate (PHOSLO) 667 MG capsule Take 2 capsules (1,334 mg total) by mouth with breakfast, with lunch, and with evening meal. Patient not taking: Reported on 12/11/2022 10/20/22   Tiffany Kocher, DO    Inpatient Medications: Scheduled Meds:  Continuous Infusions:  heparin 1,300 Units/hr (12/11/22 0732)   PRN Meds: fentaNYL (SUBLIMAZE) injection  Allergies:   No Known Allergies  Social History:   Social History   Socioeconomic History   Marital status: Single    Spouse name: Not on file   Number of children: Not on file   Years of education: Not on file   Highest education level: Not on file  Occupational History   Not on file  Tobacco Use   Smoking status: Never   Smokeless tobacco: Never  Vaping Use   Vaping Use: Never used  Substance and Sexual Activity   Alcohol use: Not Currently   Drug use: Not Currently    Types: Marijuana   Sexual activity: Not on file  Other Topics Concern   Not on file  Social History Narrative   Not on file   Social Determinants of Health   Financial Resource Strain: Not on file  Food Insecurity: No Food Insecurity (10/19/2022)   Hunger Vital Sign    Worried About Running Out of Food in the Last Year: Never true    Ran Out of Food in the Last Year: Never true  Transportation Needs: Unmet Transportation Needs (10/19/2022)   PRAPARE - Administrator, Civil Service (Medical): Yes    Lack of Transportation (Non-Medical): Yes  Physical Activity: Not on file  Stress: Not on file  Social Connections: Not on file  Intimate Partner Violence: Not At Risk (10/19/2022)   Humiliation, Afraid, Rape, and Kick questionnaire    Fear of Current or Ex-Partner: No    Emotionally Abused: No    Physically Abused: No    Sexually Abused: No    Family History:    Family History  Problem Relation Age of Onset   CAD Mother    Hypertension Mother    Diabetes Neg Hx    Stroke Neg Hx    Cancer Neg Hx    Kidney failure Neg Hx    Stomach cancer Neg Hx    Colon cancer Neg Hx    Rectal cancer Neg Hx      ROS:  Please see the history of present illness.   All other ROS reviewed and negative.     Physical Exam/Data:    Vitals:   12/11/22 0440 12/11/22 0442 12/11/22 0449 12/11/22 0600  BP: (!) 164/82  (!) 164/82 (!) 158/90  Pulse: 92   85  Resp: (!) 22   20  Temp: 98.4 F (36.9 C)     TempSrc: Oral     SpO2: 94%  99% 100%  Weight:  99.8 kg    Height:  6\' 1"  (1.854 m)     No intake or output data in the 24 hours ending 12/11/22 0831    12/11/2022    4:42 AM 10/19/2022    7:19 PM 10/19/2022    1:18 PM  Last 3 Weights  Weight (lbs) 220 lb 218 lb 14.7 oz 218 lb 4.1 oz  Weight (kg) 99.791 kg 99.3 kg 99 kg     Body mass index is 29.03 kg/m.  General:  Well nourished, well developed, in no acute distress HEENT: normal Neck: no JVD Vascular: No carotid bruits; Distal pulses 2+ bilaterally.  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  The previously seen T wave inversion in the inferior lead has improved. Telemetry:  Telemetry was personally reviewed and demonstrates: Normal sinus rhythm, no significant ventricular ectopy.  Relevant CV Studies:  Echo 04/17/2022  1. Left ventricular ejection fraction, by estimation, is 55%. The left  ventricle has normal function. The left ventricle demonstrates regional  wall motion abnormalities with basal inferior hypokinesis. There is mild  concentric left ventricular  hypertrophy. Left ventricular diastolic parameters are consistent with  Grade II diastolic dysfunction (pseudonormalization).   2. Right ventricular systolic function is normal. The right ventricular  size is normal. Tricuspid  regurgitation signal is inadequate for assessing  PA pressure.   3. The mitral valve is normal in structure. Trivial mitral valve  regurgitation. No evidence of mitral stenosis.   4. The aortic valve is tricuspid. Aortic valve regurgitation is not  visualized. No aortic stenosis is present.   5. The inferior vena cava is normal in size with greater than 50%  respiratory variability, suggesting right atrial pressure of 3 mmHg.    Myoview 08/17/2022   LV perfusion is abnormal. There is no evidence of ischemia. There is evidence of infarction. Defect 1: There is a medium defect with severe reduction in uptake present in the apical to basal inferior location(s) that is fixed. There is abnormal wall motion in the defect area. Consistent with infarction.   Left ventricular function is abnormal. Nuclear stress EF: 42 %. The left ventricular ejection fraction is moderately decreased (30-44%). End diastolic cavity size is moderately enlarged.   Findings are consistent with infarction. The study is intermediate risk.    Laboratory Data:  High Sensitivity Troponin:   Recent Labs  Lab 12/11/22 0441 12/11/22 0657  TROPONINIHS 435* 428*     Chemistry Recent Labs  Lab 12/11/22 0441  NA 137  K 4.3  CL 98  CO2 23  GLUCOSE 134*  BUN 77*  CREATININE 15.23*  CALCIUM 8.5*  GFRNONAA 3*  ANIONGAP 16*    Recent Labs  Lab 12/11/22 0441  PROT 7.0  ALBUMIN 3.0*  AST 15  ALT 12  ALKPHOS 75  BILITOT 0.6   Lipids No  results for input(s): "CHOL", "TRIG", "HDL", "LABVLDL", "LDLCALC", "CHOLHDL" in the last 168 hours.  Hematology Recent Labs  Lab 12/11/22 0441  WBC 7.6  RBC 2.65*  HGB 8.3*  HCT 25.7*  MCV 97.0  MCH 31.3  MCHC 32.3  RDW 16.9*  PLT 168   Thyroid No results for input(s): "TSH", "FREET4" in the last 168 hours.  BNPNo results for input(s): "BNP", "PROBNP" in the last 168 hours.  DDimer No results for input(s): "DDIMER" in the last 168 hours.   Radiology/Studies:  DG  Chest Portable 1 View  Result Date: 12/11/2022 CLINICAL DATA:  Fever and chest pain. EXAM: PORTABLE CHEST 1 VIEW COMPARISON:  10/19/2022 FINDINGS: The cardio pericardial silhouette is enlarged. There is pulmonary vascular congestion without overt pulmonary edema. Basilar atelectasis with no substantial pleural effusion. No acute bony abnormality. Telemetry leads overlie the chest. IMPRESSION: 1. Enlargement of the cardiopericardial silhouette with pulmonary vascular congestion. 2. Basilar atelectasis. Electronically Signed   By: Kennith Center M.D.   On: 12/11/2022 06:08     Assessment and Plan:   Chest pain with elevated troponin  -Serial troponin 435-->428, he has chronically elevated troponin, however current troponin elevation although flat is elevated when compared to the previous baseline elevation of around 100.  -Chest pain somewhat atypical and no has been intermittent for the past 2 days.  He describes the symptom as a tightness under the left breast that is worse with palpation and deep inspiration.  He has had many hospitalizations for chest discomfort  -He has extensive coronary artery calcification seen on the previous CTA in April 2024.  Myoview obtained in February 2024 showed EF 42%, medium defect of severe reduction in uptake present in the apical to basal inferior location which is fixed, no reversible ischemia.  He has basal inferior hypokinesis on the previous echocardiogram in October 2023.  -Previous studies suggest he does have underlying coronary artery disease.  The question comes down to how much of his current chest pain is attributed to coronary artery disease.  -Complicating factor is done underlying anemia.  He is legally blind, therefore does not know if he is having blood in the urine or blood in the stool.  Stool Hemoccult is negative.  Will discuss with MD if definitive workup is required in this case.  Chronic systolic CHF with improved EF: Although patient complains  of increasing shortness of breath.  Despite complaint of shortness of breath laying down, he appears to be euvolemic on exam.  Chest x-ray showed pulmonary vascular congestion and bibasilar atelectasis.  He is due for dialysis this afternoon.  Anemia: Hemoglobin down to 8.3 from the previous 11.4 and 13.1 from 1 month ago.  Will defer additional workup to the primary team.  ESRD HD MWF: Compliant with dialysis sessions according to the patient.  Last dialysis was on Friday, next dialysis will this afternoon.  He says he usually get dialysis around 4 PM after getting off work.  Hypertension: Although he has a history of uncontrolled hypertension with systolic blood pressure over 200 due to medication noncompliance, he says he has been more compliant with his medication lately.  Systolic blood pressure has been in the 150s to 160s range in the emergency room.  On carvedilol 25 mg twice a day and olmesartan 20 mg daily.  DM2: Although he carries a diagnosis of diabetes, he is not on any diabetic medication at home.  He had a hemoglobin A1c of 11.8 in December 2008, since 2018, his  hemoglobin A1c has been hovering between 5.1 to 6.2.  Legally blind   Risk Assessment/Risk Scores:       For questions or updates, please contact [facility] Please consult [email or web address] for contact info under    Signed, Azalee Course, PA  12/11/2022 8:31 AM As above, patient seen and examined.  Briefly he is a 52 year old male with past medical history of end-stage renal disease dialysis dependent, history of cardiomyopathy improved by most recent echocardiogram, hypertension, diabetes mellitus for evaluation of recurrent chest pain.  Most recent echocardiogram October 2023 showed ejection fraction 55% with basal inferior hypokinesis.  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  Patient has been treated medically.  However he has continued to have intermittent  chest pain with multiple evaluations in the emergency room.  He presents with recurrent chest pain under the left breast area.  It is described as a squeezing pain and has been persistent for 2 days.  It increases with lying flat and deep inspiration.  It also increases with palpation.  Cardiology now asked to evaluate.  Troponins are 435 and 428.  Hemoglobin is 8.3 (decreased from 11.28 September 2022).  Creatinine 15.23.  Electrocardiogram shows normal sinus rhythm with inferolateral ST depression unchanged compared to previous.  1 chest pain-symptoms are extremely atypical.  They have been persistent for 2 days and increased with lying flat and deep inspiration.  They also are reproduced with palpation.  Possible musculoskeletal pain.  Given renal insufficiency must also consider pericarditis.  Will arrange echocardiogram to reassess LV function and to rule out pericardial effusion.  His troponin is elevated but no clear trend.  Difficult situation.  He continues to have multiple evaluations in the emergency room for chest pain.  Previous CT showed coronary calcification.  Multiple risk factors.  Troponin minimally elevated.  Previous nuclear study showed prior inferior infarct but no ischemia.  I think definitive evaluation is warranted.  Will plan cardiac catheterization once his anemia has been assessed.  The risk and benefits including myocardial infarction, CVA and death have been discussed and he agrees to proceed.  Will treat with aspirin 81 mg daily and statin.  I do not think he requires heparin.  2 end-stage renal disease-needs nephrology evaluation.  3 normocytic anemia-hemoglobin is decreased from 11.28 September 2022 to 8.3 this admission.  Further evaluation per primary service.  This needs to be investigated prior to catheterization.  4 hypertension-resume preadmission blood pressure medications and adjust as needed.  Olga Millers, MD

## 2022-12-11 NOTE — Consult Note (Signed)
Reason for Consult: To manage dialysis and dialysis related needs Referring Physician: Dr Curly Shores [name] is an 52 y.o. male.  HPI: Pt is a 69M with ESRD on HD MWF who is now seen in consultation at the request of Dr Lafonda Mosses for management of ESRD and provision of HD.    Pt was admitted earlier this AM for n/v and CP of 2 days duration.  Of note, he's had multiple admissions for similar symptoms.  Noted trops were flat in the 400s.  Cardiology has been consulted.  He is to undergo cath at some point in addition to anemia workup.  In this setting we are asked to see.  Pt is sleeping on my evaluation and doesn't have any complaints right now.    Has reguarly shortened rx and early signoffs.  Missed 12/06/22.    His last Hgb at HD center (checked weekly) was 10.5 on 11/29/22.    Dialyzes at [facility] MWF 4 hrs Edw 101 kg BFR 450 2K/2Ca F180 dialyzer AVF Heparin 6000 u bolus Mircera 120 mcg q 4 weeks, last given 11/29/22 Calcitriol 1.50 q rx  Past Medical History:  Diagnosis Date   Acute CHF (congestive heart failure) (HCC) 07/27/2019   AKI (acute kidney injury) (HCC) 12/11/2016   Allergy, unspecified, initial encounter 08/25/2019   Anemia 2021   Anesthesia of skin 03/31/2020   Asthma    as a child   Blind    Cellulitis, perineum 11/26/2019   CHF (congestive heart failure) (HCC) 2021   Chronic kidney disease, stage V (HCC) 06/17/2018   Chronic kidney disease, stage V (HCC) 06/17/2018   Complication of vascular dialysis catheter 08/25/2019   COVID-19 virus infection 07/27/2019   Diabetes mellitus without complication (HCC)    type 2   Dilated cardiomyopathy (HCC) 07/03/2018   Elevated troponin    Encounter for removal of sutures 08/04/2020   Fe deficiency anemia 12/23/2018   Fluid overload 12/11/2016   Hypertension    Hypertensive urgency 12/11/2016   Hypomagnesemia 12/13/2016   Legally blind    B/L   Pain, unspecified 10/17/2019   Pneumonia 2021   Renal disorder    dialysis T-TH-Sat    Shortness of breath 11/10/2019   Symptomatic anemia 12/21/2018   Type 2 diabetes mellitus with diabetic peripheral angiopathy without gangrene (HCC) 08/25/2019   Unspecified protein-calorie malnutrition (HCC) 08/25/2019    Past Surgical History:  Procedure Laterality Date   BASCILIC VEIN TRANSPOSITION Right 10/16/2019   Procedure: Basilic Vein Transposition Right Arm;  Surgeon: Nada Libman, MD;  Location: [facility] OR;  Service: Vascular;  Laterality: Right;   BASCILIC VEIN TRANSPOSITION Right 01/29/2020   Procedure: RIGHT ARM SECOND STAGE BASCILIC VEIN TRANSPOSITION;  Surgeon: Nada Libman, MD;  Location: [facility] OR;  Service: Vascular;  Laterality: Right;   BIOPSY  12/22/2018   Procedure: BIOPSY;  Surgeon: Carman Ching, MD;  Location: [facility] ENDOSCOPY;  Service: Endoscopy;;   ESOPHAGOGASTRODUODENOSCOPY N/A 12/22/2018   Procedure: ESOPHAGOGASTRODUODENOSCOPY (EGD);  Surgeon: Carman Ching, MD;  Location: [facility] ENDOSCOPY;  Service: Endoscopy;  Laterality: N/A;   IR FLUORO GUIDE CV LINE RIGHT  07/29/2019   IR US GUIDE VASC ACCESS RIGHT  07/29/2019    Family History  Problem Relation Age of Onset   CAD Mother    Hypertension Mother    Diabetes Neg Hx    Stroke Neg Hx    Cancer Neg Hx    Kidney failure Neg Hx    Stomach cancer Neg Hx  Colon cancer Neg Hx    Rectal cancer Neg Hx     Social History:  reports that he has never smoked. He has never used smokeless tobacco. He reports that he does not currently use alcohol. He reports that he does not currently use drugs after having used the following drugs: Marijuana.  Allergies: No Known Allergies  Medications: Scheduled:  calcium acetate  2,001 mg Oral TID with meals   carvedilol  25 mg Oral BID WC   gabapentin  100 mg Oral TID   irbesartan  150 mg Oral Daily   multivitamin  1 tablet Oral Daily   pantoprazole  40 mg Oral Daily   rosuvastatin  10 mg Oral Daily     Results for orders placed or performed during the hospital encounter of 12/11/22  (from the past 48 hour(s))  CBC with Differential     Status: Abnormal   Collection Time: 12/11/22  4:41 AM  Result Value Ref Range   WBC 7.6 4.0 - 10.5 K/uL   RBC 2.65 (L) 4.22 - 5.81 MIL/uL   Hemoglobin 8.3 (L) 13.0 - 17.0 g/dL   HCT 08.6 (L) 57.8 - 46.9 %   MCV 97.0 80.0 - 100.0 fL   MCH 31.3 26.0 - 34.0 pg   MCHC 32.3 30.0 - 36.0 g/dL   RDW 62.9 (H) 52.8 - 41.3 %   Platelets 168 150 - 400 K/uL   nRBC 0.0 0.0 - 0.2 %   Neutrophils Relative % 63 %   Neutro Abs 4.8 1.7 - 7.7 K/uL   Lymphocytes Relative 20 %   Lymphs Abs 1.5 0.7 - 4.0 K/uL   Monocytes Relative 13 %   Monocytes Absolute 0.9 0.1 - 1.0 K/uL   Eosinophils Relative 4 %   Eosinophils Absolute 0.3 0.0 - 0.5 K/uL   Basophils Relative 0 %   Basophils Absolute 0.0 0.0 - 0.1 K/uL   Immature Granulocytes 0 %   Abs Immature Granulocytes 0.02 0.00 - 0.07 K/uL    Comment: Performed at [facility] Lab, [address], [place], [place] [address]  Comprehensive metabolic panel     Status: Abnormal   Collection Time: 12/11/22  4:41 AM  Result Value Ref Range   Sodium 137 135 - 145 mmol/L   Potassium 4.3 3.5 - 5.1 mmol/L   Chloride 98 98 - 111 mmol/L   CO2 23 22 - 32 mmol/L   Glucose, Bld 134 (H) 70 - 99 mg/dL    Comment: Glucose reference range applies only to samples taken after fasting for at least 8 hours.   BUN 77 (H) 6 - 20 mg/dL   Creatinine, Ser 02.72 (H) 0.61 - 1.24 mg/dL   Calcium 8.5 (L) 8.9 - 10.3 mg/dL   Total Protein 7.0 6.5 - 8.1 g/dL   Albumin 3.0 (L) 3.5 - 5.0 g/dL   AST 15 15 - 41 U/L   ALT 12 0 - 44 U/L   Alkaline Phosphatase 75 38 - 126 U/L   Total Bilirubin 0.6 0.3 - 1.2 mg/dL   GFR, Estimated 3 (L) >60 mL/min    Comment: (NOTE) Calculated using the CKD-EPI Creatinine Equation (2021)    Anion gap 16 (H) 5 - 15    Comment: Performed at [facility], [address] [place], [place] [address]  Troponin I (High Sensitivity)     Status: Abnormal   Collection Time: 12/11/22  4:41 AM  Result  Value Ref Range   Troponin I (  High Sensitivity) 435 (HH) <18 ng/L    Comment: CRITICAL RESULT CALLED TO, READ BACK BY AND VERIFIED WITH H. STEWART, EMTP. [id number] 12/11/22. LPAIT (NOTE) Elevated high sensitivity troponin I (hsTnI) values and significant  changes across serial measurements may suggest ACS but many other  chronic and acute conditions are known to elevate hsTnI results.  Refer to the "Links" section for chest pain algorithms and additional  guidance. Performed at [facility], [address], [place], [place] [address]   Lipase, blood     Status: Abnormal   Collection Time: 12/11/22  4:41 AM  Result Value Ref Range   Lipase 55 (H) 11 - 51 U/L    Comment: Performed at [facility], [address], [place], [place] [address]  POC occult blood, ED     Status: None   Collection Time: 12/11/22  6:32 AM  Result Value Ref Range   Fecal Occult Bld NEGATIVE NEGATIVE  Troponin I (High Sensitivity)     Status: Abnormal   Collection Time: 12/11/22  6:57 AM  Result Value Ref Range   Troponin I (High Sensitivity) 428 (HH) <18 ng/L    Comment: CRITICAL VALUE NOTED. VALUE IS CONSISTENT WITH PREVIOUSLY REPORTED/CALLED VALUE (NOTE) Elevated high sensitivity troponin I (hsTnI) values and significant  changes across serial measurements may suggest ACS but many other  chronic and acute conditions are known to elevate hsTnI results.  Refer to the "Links" section for chest pain algorithms and additional  guidance. Performed at [facility], [address] [place], [place] [address]   Renal function panel     Status: Abnormal   Collection Time: 12/11/22  6:57 AM  Result Value Ref Range   Sodium 140 135 - 145 mmol/L   Potassium 4.6 3.5 - 5.1 mmol/L   Chloride 101 98 - 111 mmol/L   CO2 24 22 - 32 mmol/L   Glucose, Bld 122 (H) 70 - 99 mg/dL    Comment: Glucose reference range applies only to samples taken after fasting for at least 8 hours.   BUN 72 (H) 6 - 20 mg/dL    Creatinine, Ser 86.57 (H) 0.61 - 1.24 mg/dL   Calcium 8.4 (L) 8.9 - 10.3 mg/dL   Phosphorus 8.5 (H) 2.5 - 4.6 mg/dL   Albumin 3.0 (L) 3.5 - 5.0 g/dL   GFR, Estimated 3 (L) >60 mL/min    Comment: (NOTE) Calculated using the CKD-EPI Creatinine Equation (2021)    Anion gap 15 5 - 15    Comment: Performed at [facility], [address] [place], [place] [address]  Lipid panel     Status: Abnormal   Collection Time: 12/11/22 11:26 AM  Result Value Ref Range   Cholesterol 160 0 - 200 mg/dL   Triglycerides 295 <284 mg/dL   HDL 31 (L) >13 mg/dL   Total CHOL/HDL Ratio 5.2 RATIO   VLDL 22 0 - 40 mg/dL   LDL Cholesterol 244 (H) 0 - 99 mg/dL    Comment:        Total Cholesterol/HDL:CHD Risk Coronary Heart Disease Risk Table                     Men   Women  1/2 Average Risk   3.4   3.3  Average Risk       5.0   4.4  2 X Average Risk   9.6   7.1  3 X Average Risk  23.4   11.0        Use the calculated Patient Ratio above and the CHD Risk Table to determine the patient's CHD Risk.        ATP III CLASSIFICATION (LDL):  <100     mg/dL   Optimal  409-811  mg/dL   Near or Above                    Optimal  130-159  mg/dL   Borderline  914-782  mg/dL   High  >956     mg/dL   Very High Performed at [facility] Lab, [address], [place], [place] [address]   CBC     Status: Abnormal   Collection Time: 12/11/22 11:26 AM  Result Value Ref Range   WBC 7.4 4.0 - 10.5 K/uL   RBC 2.76 (L) 4.22 - 5.81 MIL/uL   Hemoglobin 8.7 (L) 13.0 - 17.0 g/dL   HCT 65.7 (L) 84.6 - 96.2 %   MCV 97.1 80.0 - 100.0 fL   MCH 31.5 26.0 - 34.0 pg   MCHC 32.5 30.0 - 36.0 g/dL   RDW 95.2 (H) 84.1 - 32.4 %   Platelets 164 150 - 400 K/uL   nRBC 0.0 0.0 - 0.2 %    Comment: Performed at [facility] Lab, [address], [place], [place] [address]  Glucose, capillary     Status: None   Collection Time: 12/11/22 12:11 PM  Result Value Ref Range   Glucose-Capillary 85 70 - 99 mg/dL    Comment: Glucose  reference range applies only to samples taken after fasting for at least 8 hours.    DG Chest Portable 1 View  Result Date: 12/11/2022 CLINICAL DATA:  Fever and chest pain. EXAM: PORTABLE CHEST 1 VIEW COMPARISON:  10/19/2022 FINDINGS: The cardio pericardial silhouette is enlarged. There is pulmonary vascular congestion without overt pulmonary edema. Basilar atelectasis with no substantial pleural effusion. No acute bony abnormality. Telemetry leads overlie the chest. IMPRESSION: 1. Enlargement of the cardiopericardial silhouette with pulmonary vascular congestion. 2. Basilar atelectasis. Electronically Signed   By: Kennith Center M.D.   On: 12/11/2022 06:08    ROS: all other systems reviewed and are negative except as per HPI  Blood pressure (!) 194/99, pulse 76, temperature (!) 97.5 F (36.4 C), temperature source Oral, resp. rate 11, height 6\' 1"  (1.854 m), weight 109.4 kg, SpO2 100 %. . GEN NAD, sitting in bed HEENT glasses off, EOMI PERRL NECK no JVD PULM clear CV RRR  ABD: soft EXT no LE edema NEURO AAO x 3 nonfocal ACCESS AVF + T/B  Assessment/Plan: 1 chest pain: atypical chest pain.  Cardiology following, appreciate assistance 2 ESRD:  MWF normally- provide HD on schedule 3 Hypertension:  expect to improve with UF, on home meds too 4. Anemia of ESRD: Hgb 10.5 on 11/29/22--> down to 8.3 today.  Agree with workup of occult blood loss.  He did get his ESA at the time it was last due. 5. Metabolic Bone Disease: Calcitriol 1.5 mcg q rx, binders when eating 6.  Dispo: inpt  [place] 12/11/2022, 1:25 PM

## 2022-12-11 NOTE — ED Provider Notes (Signed)
NSTEMI. Needs admit. Paged to unassigned. Cardiology to consult Physical Exam  BP (!) 158/90   Pulse 85   Temp 98.4 F (36.9 C) (Oral)   Resp 20   Ht 6\' 1"  (1.854 m)   Wt 99.8 kg   SpO2 100%   BMI 29.03 kg/m   Physical Exam  Procedures  Procedures  ED Course / MDM    Medical Decision Making Amount and/or Complexity of Data Reviewed Labs: ordered. Radiology: ordered.  Risk Prescription drug management. Decision regarding hospitalization.          Arby Barrette, MD 12/11/22 [phone]

## 2022-12-11 NOTE — ED Triage Notes (Signed)
Chief Complaint  Patient presents with   Chest Pain   Pt presents to ED 31 via EMS from home with above complaint.  Reports "squeezing" chest pain to left side for 2 days; states it got worse this morning.  Denies nausea/vomiting.  Had slight headache that resolved en route.  Pt given 324 mg Aspirin en route.  Pt gets dialysis MWF; last treatment on Friday.  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

## 2022-12-11 NOTE — Progress Notes (Signed)
ANTICOAGULATION CONSULT NOTE - Initial Consult  Pharmacy Consult for heparin Indication: chest pain/ACS  No Known Allergies  Patient Measurements: Height: 6\' 1"  (185.4 cm) Weight: 99.8 kg (220 lb) IBW/kg (Calculated) : 79.9 Heparin Dosing Weight: TBW  Vital Signs: Temp: 98.4 F (36.9 C) (06/17 0440) Temp Source: Oral (06/17 0440) BP: 158/90 (06/17 0600) Pulse Rate: 85 (06/17 0600)  Labs: Recent Labs    12/11/22 0441  HGB 8.3*  HCT 25.7*  PLT 168  CREATININE 15.23*  TROPONINIHS 435*    Estimated Creatinine Clearance: 7.1 mL/min (A) (by C-G formula based on SCr of 15.23 mg/dL (H)).   Medical History: Past Medical History:  Diagnosis Date   Acute CHF (congestive heart failure) (HCC) 07/27/2019   AKI (acute kidney injury) (HCC) 12/11/2016   Allergy, unspecified, initial encounter 08/25/2019   Anemia 2021   Anesthesia of skin 03/31/2020   Asthma    as a child   Blind    Cellulitis, perineum 11/26/2019   CHF (congestive heart failure) (HCC) 2021   Chronic kidney disease, stage V (HCC) 06/17/2018   Chronic kidney disease, stage V (HCC) 06/17/2018   Complication of vascular dialysis catheter 08/25/2019   COVID-19 virus infection 07/27/2019   Diabetes mellitus without complication (HCC)    type 2   Dilated cardiomyopathy (HCC) 07/03/2018   Elevated troponin    Encounter for removal of sutures 08/04/2020   Fe deficiency anemia 12/23/2018   Fluid overload 12/11/2016   Hypertension    Hypertensive urgency 12/11/2016   Hypomagnesemia 12/13/2016   Legally blind    B/L   Pain, unspecified 10/17/2019   Pneumonia 2021   Renal disorder    dialysis T-TH-Sat   Shortness of breath 11/10/2019   Symptomatic anemia 12/21/2018   Type 2 diabetes mellitus with diabetic peripheral angiopathy without gangrene (HCC) 08/25/2019   Unspecified protein-calorie malnutrition (HCC) 08/25/2019    Assessment: 51 YOM presenting with CP, elevated troponin, he is not on anticoagulation PTA  Goal of Therapy:   Heparin level 0.3-0.7 units/ml Monitor platelets by anticoagulation protocol: Yes   Plan:  Heparin 4000 units IV x 1, and gtt at 1300 units/hr F/U 8 hour heparin level F/U cards eval and recs  Daylene Posey, PharmD, [facility] Clinical Pharmacist ED Pharmacist Phone # [phone] 12/11/2022 6:20 AM

## 2022-12-12 DIAGNOSIS — D649 Anemia, unspecified: Secondary | ICD-10-CM

## 2022-12-12 DIAGNOSIS — R0789 Other chest pain: Secondary | ICD-10-CM | POA: Diagnosis not present

## 2022-12-12 LAB — CBC WITH DIFFERENTIAL/PLATELET
Abs Immature Granulocytes: 0.02 10*3/uL (ref 0.00–0.07)
Basophils Absolute: 0 10*3/uL (ref 0.0–0.1)
Basophils Relative: 0 %
Eosinophils Absolute: 0.3 10*3/uL (ref 0.0–0.5)
Eosinophils Relative: 4 %
HCT: 25.1 % — ABNORMAL LOW (ref 39.0–52.0)
Hemoglobin: 8.3 g/dL — ABNORMAL LOW (ref 13.0–17.0)
Immature Granulocytes: 0 %
Lymphocytes Relative: 12 %
Lymphs Abs: 0.9 10*3/uL (ref 0.7–4.0)
MCH: 31.1 pg (ref 26.0–34.0)
MCHC: 33.1 g/dL (ref 30.0–36.0)
MCV: 94 fL (ref 80.0–100.0)
Monocytes Absolute: 0.7 10*3/uL (ref 0.1–1.0)
Monocytes Relative: 9 %
Neutro Abs: 5.8 10*3/uL (ref 1.7–7.7)
Neutrophils Relative %: 75 %
Platelets: 157 10*3/uL (ref 150–400)
RBC: 2.67 MIL/uL — ABNORMAL LOW (ref 4.22–5.81)
RDW: 15.9 % — ABNORMAL HIGH (ref 11.5–15.5)
WBC: 7.8 10*3/uL (ref 4.0–10.5)
nRBC: 0 % (ref 0.0–0.2)

## 2022-12-12 LAB — GLUCOSE, CAPILLARY
Glucose-Capillary: 100 mg/dL — ABNORMAL HIGH (ref 70–99)
Glucose-Capillary: 141 mg/dL — ABNORMAL HIGH (ref 70–99)

## 2022-12-12 LAB — RENAL FUNCTION PANEL
Albumin: 2.9 g/dL — ABNORMAL LOW (ref 3.5–5.0)
Anion gap: 15 (ref 5–15)
BUN: 86 mg/dL — ABNORMAL HIGH (ref 6–20)
CO2: 22 mmol/L (ref 22–32)
Calcium: 9.3 mg/dL (ref 8.9–10.3)
Chloride: 101 mmol/L (ref 98–111)
Creatinine, Ser: 17.02 mg/dL — ABNORMAL HIGH (ref 0.61–1.24)
GFR, Estimated: 3 mL/min — ABNORMAL LOW (ref >60)
Glucose, Bld: 101 mg/dL — ABNORMAL HIGH (ref 70–99)
Phosphorus: 7.2 mg/dL — ABNORMAL HIGH (ref 2.5–4.6)
Potassium: 4.9 mmol/L (ref 3.5–5.1)
Sodium: 138 mmol/L (ref 135–145)

## 2022-12-12 LAB — HEPATITIS B SURFACE ANTIBODY, QUANTITATIVE: Hep B S AB Quant (Post): 152 m[IU]/mL (ref >9.9)

## 2022-12-12 MED ORDER — ROSUVASTATIN CALCIUM 5 MG PO TABS
10.0000 mg | ORAL_TABLET | Freq: Every day | ORAL | Status: DC
  Administered 2022-12-12: 10 mg via ORAL
  Filled 2022-12-12: qty 2

## 2022-12-12 MED ORDER — ASPIRIN 81 MG PO CHEW
81.0000 mg | CHEWABLE_TABLET | ORAL | Status: AC
  Administered 2022-12-13: 81 mg via ORAL
  Filled 2022-12-12: qty 1

## 2022-12-12 MED ORDER — SODIUM CHLORIDE 0.9 % IV SOLN: INTRAVENOUS | Status: DC

## 2022-12-12 MED ORDER — ASPIRIN 81 MG PO TBEC
81.0000 mg | DELAYED_RELEASE_TABLET | Freq: Every day | ORAL | Status: DC
  Administered 2022-12-12 – 2022-12-14 (×2): 81 mg via ORAL
  Filled 2022-12-12 (×2): qty 1

## 2022-12-12 MED ORDER — SODIUM CHLORIDE 0.9% FLUSH: 3.0000 mL | INTRAVENOUS | Status: DC | PRN

## 2022-12-12 MED ORDER — ROSUVASTATIN CALCIUM 20 MG PO TABS: 40.0000 mg | ORAL_TABLET | Freq: Every day | ORAL | Status: DC

## 2022-12-12 MED ORDER — SODIUM CHLORIDE 0.9% FLUSH
3.0000 mL | Freq: Two times a day (BID) | INTRAVENOUS | Status: DC
  Administered 2022-12-12 (×2): 3 mL via INTRAVENOUS

## 2022-12-12 MED ORDER — HEPARIN SODIUM (PORCINE) 1000 UNIT/ML DIALYSIS
3000.0000 [IU] | Freq: Once | INTRAMUSCULAR | Status: AC
  Administered 2022-12-12: 3000 [IU] via INTRAVENOUS_CENTRAL
  Filled 2022-12-12: qty 3

## 2022-12-12 MED ORDER — ATORVASTATIN CALCIUM 80 MG PO TABS
80.0000 mg | ORAL_TABLET | Freq: Every day | ORAL | Status: DC
  Administered 2022-12-13 – 2022-12-14 (×2): 80 mg via ORAL
  Filled 2022-12-12 (×2): qty 1

## 2022-12-12 MED ORDER — SODIUM CHLORIDE 0.9 % IV SOLN: 250.0000 mL | INTRAVENOUS | Status: DC | PRN

## 2022-12-12 NOTE — Progress Notes (Signed)
  [facility] Progress Note   Subjective: Seen in room - refused dialysis yesterday and again this am. States he didn't want to go late at night. Agrees to go this afternoon. Still with some chest pain/dyspnea. Discussed dialysis may help with symptoms.   Objective Vitals:   12/11/22 1648 12/11/22 1926 12/11/22 2338 12/12/22 0524  BP: (!) 169/84 (!) 164/84 (!) 184/94 (!) 172/84  Pulse: 74 79 89 89  Resp: 19 17 18 16   Temp:  97.9 F (36.6 C) (!) 97.5 F (36.4 C) 98.6 F (37 C)  TempSrc:  Oral Oral Oral  SpO2: 97% 100% 100% 97%  Weight:      Height:         Additional Objective Labs: Basic Metabolic Panel: Recent Labs  Lab 12/11/22 0441 12/11/22 0657  NA 137 140  K 4.3 4.6  CL 98 101  CO2 23 24  GLUCOSE 134* 122*  BUN 77* 72*  CREATININE 15.23* 15.41*  CALCIUM 8.5* 8.4*  PHOS  --  8.5*   CBC: Recent Labs  Lab 12/11/22 0441 12/11/22 1126  WBC 7.6 7.4  NEUTROABS 4.8  --   HGB 8.3* 8.7*  HCT 25.7* 26.8*  MCV 97.0 97.1  PLT 168 164   Blood Culture    Component Value Date/Time   SDES BLOOD SITE NOT SPECIFIED 04/17/2022 0720   SPECREQUEST  04/17/2022 0720    BOTTLES DRAWN AEROBIC AND ANAEROBIC Blood Culture adequate volume   CULT  04/17/2022 0720    NO GROWTH 5 DAYS Performed at [facility] Lab, [address] [place], [place] [address]    REPTSTATUS 04/22/2022 FINAL 04/17/2022 0720     Physical Exam General: Lying in bed, nad, blind Heart: RRR Lungs: Clear bilaterally  Abdomen: soft non-tender Extremities: No LE edema  Dialysis Access: R AVF +bruit   Medications:  anticoagulant sodium citrate      aspirin EC  81 mg Oral Daily   calcium acetate  2,001 mg Oral TID with meals   carvedilol  25 mg Oral BID WC   gabapentin  100 mg Oral TID   irbesartan  150 mg Oral Daily   multivitamin  1 tablet Oral Daily   pantoprazole  40 mg Oral Daily   [START ON 12/13/2022] rosuvastatin  40 mg Oral Daily    Dialysis Orders:  [facility] MWF 4 hrs  Edw 101 kg BFR 450 2K/2Ca F180 dialyzer AVF Heparin 6000 u bolus Mircera 120 mcg q 4 weeks, last given 11/29/22 Calcitriol 1.50 q rx  Assessment/Plan: 1 Chest pain: atypical chest pain.  Cardiology following, appreciate assistance. Per notes for cardiac cath tomorrow 2 ESRD:  MWF normally- Missed yesterday. Plan for dialysis today.  3 Hypertension:  expect to improve with UF, on home meds too 4. Anemia of ESRD: Hgb 10.5 on 11/29/22--> down to 8.3. Agree with workup of occult blood loss.  He did get his ESA at the time it was last due. 5. Metabolic Bone Disease: Calcitriol 1.5 mcg q rx, binders when eating 6.  Dispo: inpt  Tomasa Blase PA-C [facility] 12/12/2022,10:34 AM

## 2022-12-12 NOTE — Progress Notes (Signed)
Pt refused Dialysis session on 6/17 @ 2300, pt refused Dialysis session again on 6/18 @ 0530. Pt educated on the importance of treatment regimen.   Bari Edward, RN

## 2022-12-12 NOTE — Progress Notes (Signed)
Called and spoke with charge RN on 6E to see what was going on with the pt and why he was refusing, to work him in this am. I was told that he said he isnt coming til after 8am. 6E floor nurse stated that she educated him that this is a hospital and when we call that is your scheduled time. HD will try to reschedule pt  HD after lunch.

## 2022-12-12 NOTE — Progress Notes (Signed)
Pt refuse early dialysis treatment.

## 2022-12-12 NOTE — Progress Notes (Signed)
Rounding Note    Patient Name: Corey Park Date of Encounter: 12/12/2022  [facility] Cardiologist: Donato Schultz, MD   Subjective   Still with chest pain.  No dyspnea.  Inpatient Medications    Scheduled Meds:  aspirin EC  81 mg Oral Daily   calcium acetate  2,001 mg Oral TID with meals   carvedilol  25 mg Oral BID WC   gabapentin  100 mg Oral TID   irbesartan  150 mg Oral Daily   multivitamin  1 tablet Oral Daily   pantoprazole  40 mg Oral Daily   rosuvastatin  10 mg Oral Daily   Continuous Infusions:  anticoagulant sodium citrate     PRN Meds: acetaminophen **OR** acetaminophen, alteplase, anticoagulant sodium citrate, fentaNYL (SUBLIMAZE) injection, heparin, heparin, lidocaine (PF), lidocaine-prilocaine, pentafluoroprop-tetrafluoroeth, senna-docusate   Vital Signs    Vitals:   12/11/22 1648 12/11/22 1926 12/11/22 2338 12/12/22 0524  BP: (!) 169/84 (!) 164/84 (!) 184/94 (!) 172/84  Pulse: 74 79 89 89  Resp: 19 17 18 16   Temp:  97.9 F (36.6 C) (!) 97.5 F (36.4 C) 98.6 F (37 C)  TempSrc:  Oral Oral Oral  SpO2: 97% 100% 100% 97%  Weight:      Height:        Intake/Output Summary (Last 24 hours) at 12/12/2022 0801 Last data filed at 12/12/2022 0526 Gross per 24 hour  Intake 720 ml  Output 400 ml  Net 320 ml      12/11/2022   11:56 AM 12/11/2022    4:42 AM 10/19/2022    7:19 PM  Last 3 Weights  Weight (lbs) 241 lb 2.9 oz 220 lb 218 lb 14.7 oz  Weight (kg) 109.4 kg 99.791 kg 99.3 kg      Telemetry    Sinus- Personally Reviewed  Physical Exam   GEN: No acute distress.   Neck: No JVD Cardiac: RRR Respiratory: Clear to auscultation bilaterally. GI: Soft, nontender, non-distended  MS: No edema Neuro:  Nonfocal  Psych: Normal affect   Labs    High Sensitivity Troponin:   Recent Labs  Lab 12/11/22 0441 12/11/22 0657  TROPONINIHS 435* 428*     Chemistry Recent Labs  Lab 12/11/22 0441 12/11/22 0657  NA 137 140  K 4.3 4.6   CL 98 101  CO2 23 24  GLUCOSE 134* 122*  BUN 77* 72*  CREATININE 15.23* 15.41*  CALCIUM 8.5* 8.4*  PROT 7.0  --   ALBUMIN 3.0* 3.0*  AST 15  --   ALT 12  --   ALKPHOS 75  --   BILITOT 0.6  --   GFRNONAA 3* 3*  ANIONGAP 16* 15    Lipids  Recent Labs  Lab 12/11/22 1126  CHOL 160  TRIG 111  HDL 31*  LDLCALC 107*  CHOLHDL 5.2    Hematology Recent Labs  Lab 12/11/22 0441 12/11/22 1126  WBC 7.6 7.4  RBC 2.65* 2.76*  HGB 8.3* 8.7*  HCT 25.7* 26.8*  MCV 97.0 97.1  MCH 31.3 31.5  MCHC 32.3 32.5  RDW 16.9* 16.8*  PLT 168 164   Radiology    ECHOCARDIOGRAM COMPLETE  Result Date: 12/11/2022    ECHOCARDIOGRAM REPORT   Patient Name:   Corey Park Date of Exam: 12/11/2022 Medical Rec #:  [id number]       Height:       73.0 in Accession #:    [id number]      Weight:  241.2 lb Date of Birth:  April 04, 1971       BSA:          2.330 m Patient Age:    51 years        BP:           169/92 mmHg Patient Gender: M               HR:           74 bpm. Exam Location:  Inpatient Procedure: 2D Echo, Cardiac Doppler and Color Doppler Indications:    Chest pain  History:        Patient has prior history of Echocardiogram examinations, most                 recent 04/17/2022. Signs/Symptoms:Chest Pain and Shortness of                 Breath; Risk Factors:Hypertension, Diabetes and Dyslipidemia.                 ESRD.  Sonographer:    Melissa Morford RDCS (AE, PE) Referring Phys: [phone] HAO MENG IMPRESSIONS  1. Left ventricular ejection fraction, by estimation, is 60 to 65%. The left ventricle has normal function. The left ventricle has no regional wall motion abnormalities. There is moderate concentric left ventricular hypertrophy. Left ventricular diastolic parameters are consistent with Grade II diastolic dysfunction (pseudonormalization).  2. Right ventricular systolic function is normal. The right ventricular size is normal.  3. Left atrial size was mildly dilated.  4. The mitral valve is normal  in structure. Trivial mitral valve regurgitation. No evidence of mitral stenosis.  5. The aortic valve is normal in structure. Aortic valve regurgitation is not visualized. No aortic stenosis is present.  6. The inferior vena cava is normal in size with <50% respiratory variability, suggesting right atrial pressure of 8 mmHg. FINDINGS  Left Ventricle: Left ventricular ejection fraction, by estimation, is 60 to 65%. The left ventricle has normal function. The left ventricle has no regional wall motion abnormalities. The left ventricular internal cavity size was normal in size. There is  moderate concentric left ventricular hypertrophy. Left ventricular diastolic parameters are consistent with Grade II diastolic dysfunction (pseudonormalization). Right Ventricle: The right ventricular size is normal. No increase in right ventricular wall thickness. Right ventricular systolic function is normal. Left Atrium: Left atrial size was mildly dilated. Right Atrium: Right atrial size was normal in size. Pericardium: There is no evidence of pericardial effusion. Mitral Valve: The mitral valve is normal in structure. Trivial mitral valve regurgitation. No evidence of mitral valve stenosis. Tricuspid Valve: The tricuspid valve is normal in structure. Tricuspid valve regurgitation is trivial. No evidence of tricuspid stenosis. Aortic Valve: The aortic valve is normal in structure. Aortic valve regurgitation is not visualized. No aortic stenosis is present. Pulmonic Valve: The pulmonic valve was normal in structure. Pulmonic valve regurgitation is mild. No evidence of pulmonic stenosis. Aorta: The aortic root is normal in size and structure. Venous: The inferior vena cava is normal in size with less than 50% respiratory variability, suggesting right atrial pressure of 8 mmHg. IAS/Shunts: No atrial level shunt detected by color flow Doppler.  LEFT VENTRICLE PLAX 2D LVIDd:         5.80 cm      Diastology LVIDs:         3.90 cm       LV e' medial:    8.55 cm/s LV PW:  1.40 cm      LV E/e' medial:  13.6 LV IVS:        1.40 cm      LV e' lateral:   8.70 cm/s LVOT diam:     2.30 cm      LV E/e' lateral: 13.3 LV SV:         98 LV SV Index:   42 LVOT Area:     4.15 cm  LV Volumes (MOD) LV vol d, MOD A2C: 129.0 ml LV vol d, MOD A4C: 146.0 ml LV vol s, MOD A2C: 63.6 ml LV vol s, MOD A4C: 69.5 ml LV SV MOD A2C:     65.4 ml LV SV MOD A4C:     146.0 ml LV SV MOD BP:      78.9 ml RIGHT VENTRICLE RV S prime:     14.30 cm/s TAPSE (M-mode): 2.0 cm LEFT ATRIUM             Index        RIGHT ATRIUM           Index LA diam:        4.30 cm 1.85 cm/m   RA Area:     19.80 cm LA Vol (A2C):   48.5 ml 20.82 ml/m  RA Volume:   59.60 ml  25.58 ml/m LA Vol (A4C):   53.8 ml 23.09 ml/m LA Biplane Vol: 50.7 ml 21.76 ml/m  AORTIC VALVE LVOT Vmax:   108.00 cm/s LVOT Vmean:  77.800 cm/s LVOT VTI:    0.237 m  AORTA Ao Root diam: 3.50 cm Ao Asc diam:  3.30 cm MITRAL VALVE                TRICUSPID VALVE MV Area (PHT): 5.31 cm     TR Peak grad:   11.3 mmHg MV Decel Time: 143 msec     TR Vmax:        168.00 cm/s MV E velocity: 116.00 cm/s MV A velocity: 65.40 cm/s   SHUNTS MV E/A ratio:  1.77         Systemic VTI:  0.24 m                             Systemic Diam: 2.30 cm Arvilla Meres MD Electronically signed by Arvilla Meres MD Signature Date/Time: 12/11/2022/3:41:16 PM    Final    DG Chest Portable 1 View  Result Date: 12/11/2022 CLINICAL DATA:  Fever and chest pain. EXAM: PORTABLE CHEST 1 VIEW COMPARISON:  10/19/2022 FINDINGS: The cardio pericardial silhouette is enlarged. There is pulmonary vascular congestion without overt pulmonary edema. Basilar atelectasis with no substantial pleural effusion. No acute bony abnormality. Telemetry leads overlie the chest. IMPRESSION: 1. Enlargement of the cardiopericardial silhouette with pulmonary vascular congestion. 2. Basilar atelectasis. Electronically Signed   By: Kennith Center M.D.   On: 12/11/2022 06:08       Patient Profile     52 year old male with past medical history of end-stage renal disease dialysis dependent, history of cardiomyopathy improved by most recent echocardiogram, hypertension, diabetes mellitus for evaluation of recurrent chest pain. 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 Patient has been treated medically. However he has continued to have intermittent chest pain with multiple evaluations in the emergency room. Troponins are 435 and 428.   Echocardiogram this admission shows ejection fraction 60 to 65%, moderate biventricular hypertrophy, grade  2 diastolic dysfunction, mild left atrial enlargement.  Assessment & Plan    1 chest pain-as outlined previously this is a difficult situation.  His symptoms are atypical but has had multiple evaluations in the emergency room for recurrent chest pain.  Also with multiple risk factors.  Previous nuclear study suggested inferior infarct but no ischemia.  Previous CT showed coronary calcification. Follow-up echocardiogram this admission shows normal LV function.  Troponin is mildly elevated but no clear trend.  Electrocardiogram without diagnostic ST changes.  I have discussed options with patient.  Will plan cardiac catheterization in a.m. for definitive evaluation.  Risk and benefits including myocardial infarction, CVA and death question increased to proceed.  His hemoglobin is stable.  Continue aspirin and statin.   2 end-stage renal disease-nephrology is following.  He declined dialysis this morning but is planned later today.  Will plan cardiac catheterization tomorrow.   3 normocytic anemia-hemoglobin unchanged this morning and he is heme-negative.  Continue to follow hemoglobin.   4 hypertension-resume preadmission blood pressure medications and adjust as needed.  For questions or updates, please contact [facility] Please consult [email or web address] for contact info under         Signed, Olga Millers, MD  12/12/2022, 8:01 AM

## 2022-12-12 NOTE — Progress Notes (Signed)
POST HD TX NOTE  12/12/22 1816  Vitals  Temp 99 F (37.2 C)  Temp Source Oral  BP (S)   (pt refused post HD tx VS check stating that his bp cuff was tight throughout tx but he didn't want to say anything about it and he thinks thats why his head hurts so bad. all monitoring lines were removed about 1810 d/t this pt c/o pain)  During Treatment Monitoring  Intra-Hemodialysis Comments (S)   (post HD tx VS check)  Post Treatment  Dialyzer Clearance Lightly streaked  Duration of HD Treatment -hour(s) 3.5 hour(s)  Hemodialysis Intake (mL) 0 mL  Liters Processed 84  Fluid Removed (mL) 2500 mL  Tolerated HD Treatment Yes  Post-Hemodialysis Comments (S)  tx completed w/o problem other than high bp throughout tx, PA was notified about increased bp and defered to Primary MD if bp still high at end of tx, UF goal met, blood rinsed back, pt refused post tx VS d/t saying cuff was too tight throughout tx but he didn't want to say anything and that i should have known his cuff was too tight and he thought that is why his head hurt. last BP that was taken before monitoring was removed was 200/94. Medication Admin: Heaprin 3000 units bolus  Fistula / Graft Right Upper arm Arteriovenous fistula  Placement Date/Time: 10/16/19 0833   Placed prior to admission: No  Orientation: Right  Access Location: Upper arm  Access Type: (c) Arteriovenous fistula  Site Condition No complications  Fistula / Graft Assessment Bruit;Thrill;Present  Drainage Description None

## 2022-12-12 NOTE — Progress Notes (Signed)
HD#1 Subjective:   Summary: This is a 52 year old male with past medical history of hypertension, type 2 diabetes, ESRD on dialysis Monday, Wednesday, Friday who presents for concerns of atypical chest pain found to have hemoglobin patient admitted for further evaluation and management of atypical chest pain as well as acute on chronic anemia of chronic kidney disease.  Overnight Events: Patient declined dialysis overnight.  He also declined labs.  Patient evaluated bedside this morning. States left sided chest pain that is intermittent. Also endorses headache due to poor sleep last night. Did not go to dialysis overnight since "it was 2:30 in the morning". He is agreeable to going to dialysis today.  Encouraged him to go to dialysis, and he was agreeable.   Objective:  Vital signs in last 24 hours: Vitals:   12/11/22 1926 12/11/22 2338 12/12/22 0524 12/12/22 0810  BP: (!) 164/84 (!) 184/94 (!) 172/84 (!) 173/83  Pulse: 79 89 89 88  Resp: 17 18 16 17   Temp: 97.9 F (36.6 C) (!) 97.5 F (36.4 C) 98.6 F (37 C)   TempSrc: Oral Oral Oral   SpO2: 100% 100% 97% 98%  Weight:      Height:       Supplemental O2: Room Air SpO2: 97 %  Physical Exam:  Constitutional: Resting in bed no acute distress HENT: normocephalic atraumatic Cardiovascular: regular rate and rhythm, no m/r/g Pulmonary/Chest: normal work of breathing on room air, lungs clear to auscultation bilaterally Abdominal: soft, non-tender, non-distended, normoactive bowel sounds MSK: normal bulk and tone  Filed Weights   12/11/22 0442 12/11/22 1156  Weight: 99.8 kg 109.4 kg     Intake/Output Summary (Last 24 hours) at 12/12/2022 1233 Last data filed at 12/12/2022 0526 Gross per 24 hour  Intake 720 ml  Output 400 ml  Net 320 ml   Net IO Since Admission: 320 mL [12/12/22 1233]  Pertinent Labs:    Latest Ref Rng & Units 12/11/2022   11:26 AM 12/11/2022    4:41 AM 10/20/2022   12:14 AM  CBC  WBC 4.0 - 10.5 K/uL  7.4  7.6  6.6   Hemoglobin 13.0 - 17.0 g/dL 8.7  8.3  16.1   Hematocrit 39.0 - 52.0 % 26.8  25.7  34.3   Platelets 150 - 400 K/uL 164  168  148        Latest Ref Rng & Units 12/11/2022    6:57 AM 12/11/2022    4:41 AM 10/20/2022   12:14 AM  CMP  Glucose 70 - 99 mg/dL 096  045  409   BUN 6 - 20 mg/dL 72  77  49   Creatinine 0.61 - 1.24 mg/dL 81.19  14.78  29.56   Sodium 135 - 145 mmol/L 140  137  133   Potassium 3.5 - 5.1 mmol/L 4.6  4.3  3.9   Chloride 98 - 111 mmol/L 101  98  89   CO2 22 - 32 mmol/L 24  23  29    Calcium 8.9 - 10.3 mg/dL 8.4  8.5  8.5   Total Protein 6.5 - 8.1 g/dL  7.0    Total Bilirubin 0.3 - 1.2 mg/dL  0.6    Alkaline Phos 38 - 126 U/L  75    AST 15 - 41 U/L  15    ALT 0 - 44 U/L  12      Imaging: ECHOCARDIOGRAM COMPLETE  Result Date: 12/11/2022    ECHOCARDIOGRAM REPORT   Patient  Name:   BLAKE QUEENAN Date of Exam: 12/11/2022 Medical Rec #:  [id number]       Height:       73.0 in Accession #:    [id number]      Weight:       241.2 lb Date of Birth:  March 25, 1971       BSA:          2.330 m Patient Age:    51 years        BP:           169/92 mmHg Patient Gender: M               HR:           74 bpm. Exam Location:  Inpatient Procedure: 2D Echo, Cardiac Doppler and Color Doppler Indications:    Chest pain  History:        Patient has prior history of Echocardiogram examinations, most                 recent 04/17/2022. Signs/Symptoms:Chest Pain and Shortness of                 Breath; Risk Factors:Hypertension, Diabetes and Dyslipidemia.                 ESRD.  Sonographer:    Melissa Morford RDCS (AE, PE) Referring Phys: [phone] HAO MENG IMPRESSIONS  1. Left ventricular ejection fraction, by estimation, is 60 to 65%. The left ventricle has normal function. The left ventricle has no regional wall motion abnormalities. There is moderate concentric left ventricular hypertrophy. Left ventricular diastolic parameters are consistent with Grade II diastolic dysfunction  (pseudonormalization).  2. Right ventricular systolic function is normal. The right ventricular size is normal.  3. Left atrial size was mildly dilated.  4. The mitral valve is normal in structure. Trivial mitral valve regurgitation. No evidence of mitral stenosis.  5. The aortic valve is normal in structure. Aortic valve regurgitation is not visualized. No aortic stenosis is present.  6. The inferior vena cava is normal in size with <50% respiratory variability, suggesting right atrial pressure of 8 mmHg. FINDINGS  Left Ventricle: Left ventricular ejection fraction, by estimation, is 60 to 65%. The left ventricle has normal function. The left ventricle has no regional wall motion abnormalities. The left ventricular internal cavity size was normal in size. There is  moderate concentric left ventricular hypertrophy. Left ventricular diastolic parameters are consistent with Grade II diastolic dysfunction (pseudonormalization). Right Ventricle: The right ventricular size is normal. No increase in right ventricular wall thickness. Right ventricular systolic function is normal. Left Atrium: Left atrial size was mildly dilated. Right Atrium: Right atrial size was normal in size. Pericardium: There is no evidence of pericardial effusion. Mitral Valve: The mitral valve is normal in structure. Trivial mitral valve regurgitation. No evidence of mitral valve stenosis. Tricuspid Valve: The tricuspid valve is normal in structure. Tricuspid valve regurgitation is trivial. No evidence of tricuspid stenosis. Aortic Valve: The aortic valve is normal in structure. Aortic valve regurgitation is not visualized. No aortic stenosis is present. Pulmonic Valve: The pulmonic valve was normal in structure. Pulmonic valve regurgitation is mild. No evidence of pulmonic stenosis. Aorta: The aortic root is normal in size and structure. Venous: The inferior vena cava is normal in size with less than 50% respiratory variability, suggesting right  atrial pressure of 8 mmHg. IAS/Shunts: No atrial level shunt detected by color flow Doppler.  LEFT VENTRICLE PLAX 2D  LVIDd:         5.80 cm      Diastology LVIDs:         3.90 cm      LV e' medial:    8.55 cm/s LV PW:         1.40 cm      LV E/e' medial:  13.6 LV IVS:        1.40 cm      LV e' lateral:   8.70 cm/s LVOT diam:     2.30 cm      LV E/e' lateral: 13.3 LV SV:         98 LV SV Index:   42 LVOT Area:     4.15 cm  LV Volumes (MOD) LV vol d, MOD A2C: 129.0 ml LV vol d, MOD A4C: 146.0 ml LV vol s, MOD A2C: 63.6 ml LV vol s, MOD A4C: 69.5 ml LV SV MOD A2C:     65.4 ml LV SV MOD A4C:     146.0 ml LV SV MOD BP:      78.9 ml RIGHT VENTRICLE RV S prime:     14.30 cm/s TAPSE (M-mode): 2.0 cm LEFT ATRIUM             Index        RIGHT ATRIUM           Index LA diam:        4.30 cm 1.85 cm/m   RA Area:     19.80 cm LA Vol (A2C):   48.5 ml 20.82 ml/m  RA Volume:   59.60 ml  25.58 ml/m LA Vol (A4C):   53.8 ml 23.09 ml/m LA Biplane Vol: 50.7 ml 21.76 ml/m  AORTIC VALVE LVOT Vmax:   108.00 cm/s LVOT Vmean:  77.800 cm/s LVOT VTI:    0.237 m  AORTA Ao Root diam: 3.50 cm Ao Asc diam:  3.30 cm MITRAL VALVE                TRICUSPID VALVE MV Area (PHT): 5.31 cm     TR Peak grad:   11.3 mmHg MV Decel Time: 143 msec     TR Vmax:        168.00 cm/s MV E velocity: 116.00 cm/s MV A velocity: 65.40 cm/s   SHUNTS MV E/A ratio:  1.77         Systemic VTI:  0.24 m                             Systemic Diam: 2.30 cm Arvilla Meres MD Electronically signed by Arvilla Meres MD Signature Date/Time: 12/11/2022/3:41:16 PM    Final     Assessment/Plan:   Principal Problem:   Atypical chest pain Active Problems:   DM (diabetes mellitus), type 2 (HCC)   HLD (hyperlipidemia)   Elevated troponin   ESRD on dialysis (HCC)   Anemia   Patient Summary: This is a 52 year old male with past medical history of hypertension, type 2 diabetes, ESRD on dialysis Monday, Wednesday, Friday who presents for concerns of atypical chest  pain found to have hemoglobin patient admitted for further evaluation and management of atypical chest pain as well as acute on chronic anemia of chronic kidney disease.  #Atypical chest pain #Hx CAD #Elevated troponin Patient evaluated bedside this morning.  Patient still having intermittent chest pain.  He states it kept him up last night.  New echo  showing no evidence of new wall motion abnormalities.  On exam, patient does not have much chest tenderness.  Patient does not look like he is in acute distress.  Vital signs are stable this morning.  Lipid panel showing elevated LDL at 107.  Cardiology to pursue ischemic workup with cardiac catheterization in the a.m. Wonder if this is due to dialysis.  Will follow-up cath results.  Less likely pericarditis.   -Cardiology consulted, appreciate their recommendations, plan for catheterization in the a.m. -Start atorvastatin 80 mg daily, stop rosuvastatin -Monitor on telemetry -Continue aspirin 81 mg daily  #Acute on chronic anemia of chronic kidney disease Patient has not had any bowel movements overnight.  Patient denies any abdominal pain.  Will need to get updated CBC to see if patient is losing blood or not.  Likely hemoglobin decreased yesterday in the setting of dilution.  Would like to see hemoglobin today to see if improved after dialysis.  This patient has not had any bowel movements recently, I am more reassured that patient is not having a GI bleed.  Will continue to monitor for bowel movements.  Iron studies does not reveal concern for iron deficiency anemia.  Anemia likely in the setting of CKD. -Monitor CBC, transfuse for Hgb <7 -Patient does start having signs of bleeding, obtain stat H&H   #ESRD on MWF HD Patient was due for dialysis yesterday, but as it was too late he declined.  Encouraged patient to stay adherent to dialysis during hospitalization.  He agreed.  Plan for dialysis today per nephrology. -Dialysis per nephrology     #HFrecEF No acute concerns for exacerbation at this time.  Patient not very volume overloaded on my exam.  Echo updated which showed ejection fraction of 60 to 65%.  No new wall motion abnormalities.  Will continue with current management. -Continue coreg 25 mg BID  -Continue home irbesartan 150 mg daily  -Continue to monitor volume status   #HTN Patient hypertensive today on exam.  Patient is on home medication of carvedilol and irbesartan.  Patient is due for dialysis, and therefore would not like to titrate medications at this time.  I do anticipate if patient gets dialysis and patient does not have good blood pressure control, could potentially add on hydralazine.  Patient continues to have chest pain, Imdur could be a potential add-on.  -Continue home carvedilol 25 mg BID, irbesartan 150 mg daily   #T2DM Last HbA1c 5.6% 08/2022. Not on any outpatient therapy. 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  -No additional management  Diet: Renal, n.p.o. at midnight IVF: None,None VTE: SCDs Code: Full PT/OT recs: None, none.  Dispo: Anticipated discharge to Home in 3 days pending clinical improvement.   Modena Slater DO Internal Medicine Resident PGY-1 [phone] Please contact the on call pager after 5 pm and on weekends at [phone].

## 2022-12-13 ENCOUNTER — Inpatient Hospital Stay (HOSPITAL_COMMUNITY)
Admission: EM | Disposition: A | Discharge status: Home / Self Care | Payer: Self-pay | Source: Home / Self Care | Attending: Internal Medicine

## 2022-12-13 DIAGNOSIS — I2510 Atherosclerotic heart disease of native coronary artery without angina pectoris: Secondary | ICD-10-CM

## 2022-12-13 HISTORY — PX: LEFT HEART CATH AND CORONARY ANGIOGRAPHY: CATH118249

## 2022-12-13 HISTORY — PX: CORONARY ULTRASOUND/IVUS: CATH118244

## 2022-12-13 LAB — POCT ACTIVATED CLOTTING TIME
Activated Clotting Time: 305 seconds
Activated Clotting Time: 189 seconds
Activated Clotting Time: 171 seconds

## 2022-12-13 SURGERY — LEFT HEART CATH AND CORONARY ANGIOGRAPHY
Anesthesia: LOCAL

## 2022-12-13 MED ORDER — FENTANYL CITRATE (PF) 100 MCG/2ML IJ SOLN
INTRAMUSCULAR | Status: AC
  Filled 2022-12-13: qty 2

## 2022-12-13 MED ORDER — MIDAZOLAM HCL 2 MG/2ML IJ SOLN
INTRAMUSCULAR | Status: AC
  Filled 2022-12-13: qty 2

## 2022-12-13 MED ORDER — IOHEXOL 350 MG/ML SOLN
INTRAVENOUS | Status: DC | PRN
  Administered 2022-12-13: 70 mL

## 2022-12-13 MED ORDER — ALTEPLASE 2 MG IJ SOLR: 2.0000 mg | Freq: Once | INTRAMUSCULAR | Status: DC | PRN

## 2022-12-13 MED ORDER — HEPARIN SODIUM (PORCINE) 5000 UNIT/ML IJ SOLN
5000.0000 [IU] | Freq: Three times a day (TID) | INTRAMUSCULAR | Status: DC
  Administered 2022-12-13 – 2022-12-14 (×2): 5000 [IU] via SUBCUTANEOUS
  Filled 2022-12-13 (×2): qty 1

## 2022-12-13 MED ORDER — HEPARIN SODIUM (PORCINE) 1000 UNIT/ML IJ SOLN
INTRAMUSCULAR | Status: AC
  Filled 2022-12-13: qty 10

## 2022-12-13 MED ORDER — LABETALOL HCL 5 MG/ML IV SOLN: 10.0000 mg | INTRAVENOUS | Status: AC | PRN

## 2022-12-13 MED ORDER — ONDANSETRON HCL 4 MG/2ML IJ SOLN: 4.0000 mg | Freq: Four times a day (QID) | INTRAMUSCULAR | Status: DC | PRN

## 2022-12-13 MED ORDER — PENTAFLUOROPROP-TETRAFLUOROETH EX AERO: 1.0000 | INHALATION_SPRAY | CUTANEOUS | Status: DC | PRN

## 2022-12-13 MED ORDER — LIDOCAINE HCL (PF) 1 % IJ SOLN
INTRAMUSCULAR | Status: AC
  Filled 2022-12-13: qty 30

## 2022-12-13 MED ORDER — LIDOCAINE HCL (PF) 1 % IJ SOLN
INTRAMUSCULAR | Status: DC | PRN
  Administered 2022-12-13: 20 mL

## 2022-12-13 MED ORDER — HEPARIN SODIUM (PORCINE) 1000 UNIT/ML DIALYSIS: 5000.0000 [IU] | INTRAMUSCULAR | Status: DC | PRN

## 2022-12-13 MED ORDER — ACETAMINOPHEN 325 MG PO TABS: 650.0000 mg | ORAL_TABLET | ORAL | Status: DC | PRN

## 2022-12-13 MED ORDER — CHLORHEXIDINE GLUCONATE CLOTH 2 % EX PADS
6.0000 | MEDICATED_PAD | Freq: Every day | CUTANEOUS | Status: DC
  Administered 2022-12-14: 6 via TOPICAL

## 2022-12-13 MED ORDER — HEPARIN (PORCINE) IN NACL 1000-0.9 UT/500ML-% IV SOLN
INTRAVENOUS | Status: DC | PRN
  Administered 2022-12-13 (×2): 500 mL

## 2022-12-13 MED ORDER — SODIUM CHLORIDE 0.9 % IV SOLN: 250.0000 mL | INTRAVENOUS | Status: DC | PRN

## 2022-12-13 MED ORDER — FENTANYL CITRATE (PF) 100 MCG/2ML IJ SOLN
INTRAMUSCULAR | Status: DC | PRN
  Administered 2022-12-13: 50 ug
  Administered 2022-12-13 (×2): 25 ug via INTRAVENOUS

## 2022-12-13 MED ORDER — HEPARIN SODIUM (PORCINE) 1000 UNIT/ML DIALYSIS: 1000.0000 [IU] | INTRAMUSCULAR | Status: DC | PRN

## 2022-12-13 MED ORDER — HYDRALAZINE HCL 20 MG/ML IJ SOLN: 10.0000 mg | INTRAMUSCULAR | Status: AC | PRN

## 2022-12-13 MED ORDER — DARBEPOETIN ALFA 150 MCG/0.3ML IJ SOSY
150.0000 ug | PREFILLED_SYRINGE | Freq: Once | INTRAMUSCULAR | Status: AC
  Administered 2022-12-13: 150 ug via SUBCUTANEOUS
  Filled 2022-12-13: qty 0.3

## 2022-12-13 MED ORDER — LIDOCAINE HCL (PF) 1 % IJ SOLN: 5.0000 mL | INTRAMUSCULAR | Status: DC | PRN

## 2022-12-13 MED ORDER — SODIUM CHLORIDE 0.9% FLUSH
3.0000 mL | INTRAVENOUS | Status: DC | PRN
  Administered 2022-12-13: 3 mL via INTRAVENOUS

## 2022-12-13 MED ORDER — MIDAZOLAM HCL 2 MG/2ML IJ SOLN
INTRAMUSCULAR | Status: DC | PRN
  Administered 2022-12-13 (×2): 1 mg via INTRAVENOUS

## 2022-12-13 MED ORDER — HEPARIN SODIUM (PORCINE) 1000 UNIT/ML IJ SOLN
INTRAMUSCULAR | Status: DC | PRN
  Administered 2022-12-13: 10000 [IU] via INTRAVENOUS

## 2022-12-13 MED ORDER — HEPARIN SODIUM (PORCINE) 1000 UNIT/ML IJ SOLN
INTRAMUSCULAR | Status: AC
  Filled 2022-12-13: qty 5

## 2022-12-13 MED ORDER — ANTICOAGULANT SODIUM CITRATE 4% (200MG/5ML) IV SOLN: 5.0000 mL | Status: DC | PRN

## 2022-12-13 MED ORDER — LIDOCAINE-PRILOCAINE 2.5-2.5 % EX CREA: 1.0000 | TOPICAL_CREAM | CUTANEOUS | Status: DC | PRN

## 2022-12-13 MED ORDER — VERAPAMIL HCL 2.5 MG/ML IV SOLN
INTRAVENOUS | Status: AC
  Filled 2022-12-13: qty 2

## 2022-12-13 MED ORDER — SODIUM CHLORIDE 0.9% FLUSH
3.0000 mL | Freq: Two times a day (BID) | INTRAVENOUS | Status: DC
  Administered 2022-12-13 – 2022-12-14 (×2): 3 mL via INTRAVENOUS

## 2022-12-13 MED ORDER — ASPIRIN 81 MG PO CHEW: 81.0000 mg | CHEWABLE_TABLET | Freq: Every day | ORAL | Status: DC

## 2022-12-13 SURGICAL SUPPLY — 15 items
CATH INFINITI 5FR MULTPACK ANG (CATHETERS) IMPLANT
CATH LAUNCHER 6FR EBU 3.75 (CATHETERS) IMPLANT
CATH OPTICROSS HD (CATHETERS) IMPLANT
KIT HEART LEFT (KITS) ×1 IMPLANT
KIT HEMO VALVE WATCHDOG (MISCELLANEOUS) IMPLANT
KIT MICROPUNCTURE NIT STIFF (SHEATH) IMPLANT
PACK CARDIAC CATHETERIZATION (CUSTOM PROCEDURE TRAY) ×1 IMPLANT
SHEATH PINNACLE 5F 10CM (SHEATH) IMPLANT
SHEATH PINNACLE 6F 10CM (SHEATH) IMPLANT
SHEATH PROBE COVER 6X72 (BAG) IMPLANT
SLED PULL BACK IVUS (MISCELLANEOUS) IMPLANT
TRANSDUCER W/STOPCOCK (MISCELLANEOUS) ×1 IMPLANT
TUBING CIL FLEX 10 FLL-RA (TUBING) ×1 IMPLANT
WIRE EMERALD 3MM-J .035X150CM (WIRE) IMPLANT
WIRE RUNTHROUGH .014X180CM (WIRE) IMPLANT

## 2022-12-13 NOTE — TOC Initial Note (Signed)
Transition of Care [facility]) - Initial/Assessment Note    Patient Details  Name: Corey Park MRN: [id number] Date of Birth: 1970/09/26  Transition of Care [facility]) CM/SW Contact:    Gala Lewandowsky, RN Phone Number: 12/13/2022, 10:53 AM  Clinical Narrative:   Risk for readmission assessment completed. Patient presented for chest pain. PTA patient states he is independent from home with son. Patient states he gets to appointments without any issues. Patient has Medicare and states he has Medicaid. Patient had questions regarding personal care services and Case Manager states he will need to contact his DSS CSW for hours that have been allotted to the patient. No further needs identified at this time.                Expected Discharge Plan: Home/Self Care Barriers to Discharge: Continued Medical Work up   Patient Goals and CMS Choice Patient states their goals for this hospitalization and ongoing recovery are:: to return home   Choice offered to / list presented to : NA    Expected Discharge Plan and Services In-house Referral: NA Discharge Planning Services: CM Consult Post Acute Care Choice: NA Living arrangements for the past 2 months: Apartment    HH Arranged: NA   Prior Living Arrangements/Services Living arrangements for the past 2 months: Apartment Lives with:: Adult Children Patient language and need for interpreter reviewed:: Yes Do you feel safe going back to the place where you live?: Yes      Need for Family Participation in Patient Care: Yes (Comment) Care giver support system in place?: Yes (comment)   Criminal Activity/Legal Involvement Pertinent to Current Situation/Hospitalization: No - Comment as needed  Activities of Daily Living Home Assistive Devices/Equipment: None ADL Screening (condition at time of admission) Patient's cognitive ability adequate to safely complete daily activities?: Yes Is the patient deaf or have difficulty hearing?:  No Does the patient have difficulty seeing, even when wearing glasses/contacts?: Yes Does the patient have difficulty concentrating, remembering, or making decisions?: No Patient able to express need for assistance with ADLs?: Yes Does the patient have difficulty dressing or bathing?: No Independently performs ADLs?: Yes (appropriate for developmental age) Does the patient have difficulty walking or climbing stairs?: No Weakness of Legs: None Weakness of Arms/Hands: None  Permission Sought/Granted Permission sought to share information with : Family Supports, Case Manager    Emotional Assessment Appearance:: Appears stated age Attitude/Demeanor/Rapport: Engaged Affect (typically observed): Appropriate Orientation: : Oriented to Self, Oriented to Place, Oriented to  Time Alcohol / Substance Use: Not Applicable Psych Involvement: No (comment)  Admission diagnosis:  Atypical chest pain [R07.89] NSTEMI (non-ST elevated myocardial infarction) (HCC) [I21.4] Anemia, unspecified type [D64.9] Patient Active Problem List   Diagnosis Date Noted   Anemia 12/12/2022   Iron deficiency anemia due to chronic blood loss 12/11/2022   ESRD on dialysis (HCC) 12/11/2022   Syncope and collapse 10/19/2022   Nonspecific chest pain 09/12/2022   Myocardial injury 08/06/2022   Non-cardiac chest pain 08/05/2022   (HFpEF) heart failure with preserved ejection fraction (HCC) 08/05/2022   Need for assistance at home without other household member able to render care 08/05/2022   Adult general medical exam 07/11/2022   Pneumonia 04/17/2022   Sepsis (HCC) 04/17/2022   Acute blood loss anemia 04/17/2022   HTN (hypertension) 04/17/2022   Thrombocytopenia (HCC) 04/17/2022   History of GI bleed 04/17/2022   Atypical chest pain 04/17/2022   GERD (gastroesophageal reflux disease) 04/17/2022   Nausea and vomiting 04/17/2022  Chronic hiccups 04/17/2022   Pain due to onychomycosis of toenails of both feet  10/11/2021   Hypercalcemia 04/13/2020   Coagulation defect, unspecified (HCC) 08/25/2019   Secondary hyperparathyroidism of renal origin (HCC) 08/25/2019   Anemia in chronic kidney disease (CKD) 12/23/2018   ESRD (end stage renal disease) on dialysis [facility])    Chronic systolic heart failure (HCC) 07/03/2018   Elevated troponin    Hypertensive urgency 12/11/2016   HLD (hyperlipidemia) 06/14/2007   DM (diabetes mellitus), type 2 (HCC) 06/11/2007   Hypertensive heart disease with CHF (congestive heart failure) (HCC) 06/11/2007   PCP:  [name], NP Pharmacy:   [id number] [place], [place] - [address] AT [facility] [address] & [address] [place] [address] Phone: [phone] Fax: [phone]  [facility] [address] [place] [address] Phone: [phone] Fax: [phone]  Social Determinants of Health (SDOH) Social History: SDOH Screenings   Food Insecurity: No Food Insecurity (12/11/2022)  Housing: Low Risk  (12/11/2022)  Transportation Needs: No Transportation Needs (12/11/2022)  Recent Concern: Transportation Needs - Unmet Transportation Needs (10/19/2022)  Utilities: Not At Risk (12/11/2022)  Depression (PHQ2-9): Low Risk  (08/10/2022)  Tobacco Use: Low Risk  (10/19/2022)   Readmission Risk Interventions    12/13/2022   10:39 AM  Readmission Risk Prevention Plan  Transportation Screening Complete  PCP or Specialist Appt within 3-5 Days Complete  HRI or Home Care Consult Complete  Social Work Consult for Recovery Care Planning/Counseling Complete  Palliative Care Screening Complete  Medication Review Oceanographer) Referral to Pharmacy

## 2022-12-13 NOTE — Interval H&P Note (Signed)
Cath Lab Visit (complete for each Cath Lab visit)  Clinical Evaluation Leading to the Procedure:   ACS: Yes.    Non-ACS:    Anginal Classification: CCS IV  Anti-ischemic medical therapy: Minimal Therapy (1 class of medications)  Non-Invasive Test Results: No non-invasive testing performed  Prior CABG: No previous CABG      History and Physical Interval Note:  12/13/2022 11:29 AM  Corey Park  has presented today for surgery, with the diagnosis of chest pain.  The various methods of treatment have been discussed with the patient and family. After consideration of risks, benefits and other options for treatment, the patient has consented to  Procedure(s): LEFT HEART CATH AND CORONARY ANGIOGRAPHY (N/A) as a surgical intervention.  The patient's history has been reviewed, patient examined, no change in status, stable for surgery.  I have reviewed the patient's chart and labs.  Questions were answered to the patient's satisfaction.     Lance Muss

## 2022-12-13 NOTE — Progress Notes (Signed)
  [facility] Progress Note   Subjective: Seen in room - Completed dialysis yesterday- net UF 2.5L. On nasal cannula this am but says breathing is better. For cardiac catheterization today   Objective Vitals:   12/12/22 1749 12/12/22 1816 12/12/22 1912 12/13/22 0544  BP: (!) 201/100  (!) 189/96 135/70  Pulse: 90  92 79  Resp: 18  13 13   Temp:  99 F (37.2 C) 99.6 F (37.6 C) 98.5 F (36.9 C)  TempSrc:  Oral Oral Oral  SpO2: 95%  91% 99%  Weight:  106.8 kg    Height:         Additional Objective Labs: Basic Metabolic Panel: Recent Labs  Lab 12/11/22 0441 12/11/22 0657 12/12/22 1025  NA 137 140 138  K 4.3 4.6 4.9  CL 98 101 101  CO2 23 24 22   GLUCOSE 134* 122* 101*  BUN 77* 72* 86*  CREATININE 15.23* 15.41* 17.02*  CALCIUM 8.5* 8.4* 9.3  PHOS  --  8.5* 7.2*    CBC: Recent Labs  Lab 12/11/22 0441 12/11/22 1126 12/12/22 1026  WBC 7.6 7.4 7.8  NEUTROABS 4.8  --  5.8  HGB 8.3* 8.7* 8.3*  HCT 25.7* 26.8* 25.1*  MCV 97.0 97.1 94.0  PLT 168 164 157    Blood Culture    Component Value Date/Time   SDES BLOOD SITE NOT SPECIFIED 04/17/2022 0720   SPECREQUEST  04/17/2022 0720    BOTTLES DRAWN AEROBIC AND ANAEROBIC Blood Culture adequate volume   CULT  04/17/2022 0720    NO GROWTH 5 DAYS Performed at [facility], [address], [place], [place] [address]    REPTSTATUS 04/22/2022 FINAL 04/17/2022 0720     Physical Exam General: Lying in bed, nad, blind Heart: RRR Lungs: Clear bilaterally  Abdomen: soft non-tender Extremities: No LE edema  Dialysis Access: R AVF +bruit   Medications:  sodium chloride     sodium chloride 10 mL/hr at 12/13/22 0546    aspirin EC  81 mg Oral Daily   atorvastatin  80 mg Oral Daily   calcium acetate  2,001 mg Oral TID with meals   carvedilol  25 mg Oral BID WC   Chlorhexidine Gluconate Cloth  6 each Topical Q0600   gabapentin  100 mg Oral TID   irbesartan  150 mg Oral Daily   multivitamin  1  tablet Oral Daily   pantoprazole  40 mg Oral Daily   sodium chloride flush  3 mL Intravenous Q12H    Dialysis Orders:  [facility] MWF 4 hrs Edw 101 kg BFR 450 2K/2Ca F180 dialyzer AVF Heparin 6000 u bolus Mircera 120 mcg q 4 weeks, last given 11/29/22 Calcitriol 1.50 q rx  Assessment/Plan: 1 Chest pain: atypical chest pain.  Cardiology following, appreciate assistance. Per notes for cardiac cath today  2 ESRD:  MWF normally- HD off schedule yesterday. HD on schedule today  3 Hypertension:  expect to improve with UF, on home meds too 4. Anemia of ESRD: Hgb 10.5 on 11/29/22--> down to 8.3. Agree with workup of occult blood loss.  Tsat 34%. Will order ESA today 6/19 - increase frequency q 2 wks.  5. Metabolic Bone Disease: Calcitriol 1.5 mcg q rx, binders when eating 6.  Dispo: inpt  Tomasa Blase PA-C [facility] 12/13/2022,9:08 AM

## 2022-12-13 NOTE — Plan of Care (Signed)

## 2022-12-13 NOTE — Progress Notes (Addendum)
HD#2 Subjective:   Summary: This is a 52 year old male with past medical history of hypertension, type 2 diabetes, ESRD on dialysis Monday, Wednesday, Friday who presents for concerns of atypical chest pain found to have hemoglobin patient admitted for further evaluation and management of atypical chest pain as well as acute on chronic anemia of chronic kidney disease.  Overnight Events: No acute events overnight.  Patient evaluated bedside this morning. Patient expressed frustration due to lack of sleep. Headache at times. Tolerated dialysis yesterday. Discussed the plan with patient and answered all questions. Did not get to talk to patient much as he was frustrated and did not want to talk today.    Objective:  Vital signs in last 24 hours: Vitals:   12/12/22 1749 12/12/22 1816 12/12/22 1912 12/13/22 0544  BP: (!) 201/100  (!) 189/96 135/70  Pulse: 90  92 79  Resp: 18  13 13   Temp:  99 F (37.2 C) 99.6 F (37.6 C) 98.5 F (36.9 C)  TempSrc:  Oral Oral Oral  SpO2: 95%  91% 99%  Weight:  106.8 kg    Height:       Supplemental O2: Room Air SpO2: 97 %  Physical Exam:  Constitutional: Resting in bed no acute distress HENT: normocephalic atraumatic Cardiovascular: regular rate and rhythm, no m/r/g Pulmonary/Chest: normal work of breathing on room air, lungs clear to auscultation bilaterally MSK: normal bulk and tone  Filed Weights   12/11/22 1156 12/12/22 1351 12/12/22 1816  Weight: 109.4 kg 109.1 kg 106.8 kg     Intake/Output Summary (Last 24 hours) at 12/13/2022 0942 Last data filed at 12/13/2022 0939 Gross per 24 hour  Intake --  Output 2750 ml  Net -2750 ml   Net IO Since Admission: -2,790 mL [12/13/22 0942]  Pertinent Labs:    Latest Ref Rng & Units 12/12/2022   10:26 AM 12/11/2022   11:26 AM 12/11/2022    4:41 AM  CBC  WBC 4.0 - 10.5 K/uL 7.8  7.4  7.6   Hemoglobin 13.0 - 17.0 g/dL 8.3  8.7  8.3   Hematocrit 39.0 - 52.0 % 25.1  26.8  25.7   Platelets  150 - 400 K/uL 157  164  168        Latest Ref Rng & Units 12/12/2022   10:25 AM 12/11/2022    6:57 AM 12/11/2022    4:41 AM  CMP  Glucose 70 - 99 mg/dL 161  096  045   BUN 6 - 20 mg/dL 86  72  77   Creatinine 0.61 - 1.24 mg/dL 40.98  11.91  47.82   Sodium 135 - 145 mmol/L 138  140  137   Potassium 3.5 - 5.1 mmol/L 4.9  4.6  4.3   Chloride 98 - 111 mmol/L 101  101  98   CO2 22 - 32 mmol/L 22  24  23    Calcium 8.9 - 10.3 mg/dL 9.3  8.4  8.5   Total Protein 6.5 - 8.1 g/dL   7.0   Total Bilirubin 0.3 - 1.2 mg/dL   0.6   Alkaline Phos 38 - 126 U/L   75   AST 15 - 41 U/L   15   ALT 0 - 44 U/L   12     Imaging: No results found.  Assessment/Plan:   Principal Problem:   Atypical chest pain Active Problems:   DM (diabetes mellitus), type 2 (HCC)   HLD (hyperlipidemia)   Elevated  troponin   ESRD on dialysis [facility])   Anemia   Patient Summary: This is a 52 year old male with past medical history of hypertension, type 2 diabetes, ESRD on dialysis Monday, Wednesday, Friday who presents for concerns of atypical chest pain found to have hemoglobin patient admitted for further evaluation and management of atypical chest pain as well as acute on chronic anemia of chronic kidney disease.  #Atypical chest pain #Hx CAD #Elevated troponin Patient evaluated bedside this morning.  Patient does report still having some chest pain.  Cardiology planning to catheterize patient today to evaluate for ischemia.  Will resume diet after evaluation.  Will follow-up cath results.  -Cardiology consulted, cardiac catheterization today -Continue atorvastatin 80 mg daily -Continue aspirin 81 mg daily  #Acute on chronic anemia of chronic kidney disease, stabilized Anemia stable at this time.  He did have a bowel movement yesterday, which was nonbloody and not melanotic.  He denies any abdominal.pain. At this time I do not think patient is bleeding.  Do not have new labs as of yet, but hemoglobin could have  been decreased in the setting of dilution.  Patient has been dialyzed, and had 2.5 L removed, will obtain CBC in the a.m. to ensure stability. -Monitor CBC   #ESRD on MWF HD Patient was able to get dialysis yesterday, pulled off 2.5 L.  Patient tolerated well. -Continue dialysis per nephrology while inpatient    #HFrecEF No acute concerns for exacerbation at this time.  Will continue to monitor for signs of respiratory distress during hospitalization. -Continue coreg 25 mg BID  -Continue home irbesartan 150 mg daily  -Continue to monitor volume status   #HTN After dialysis, most recent blood pressure 135/70.  Will continue to monitor while inpatient.  Patient remains hypertensive, could potentially add on hydralazine. Patient does reports some intermittent headaches and it was seen that systolic pressures before dialysis and during dialysis were in the 200s at times, which could be causing these headache. Will monitor closely.  -Continue home carvedilol 25 mg BID, irbesartan 150 mg daily -Monitor blood pressure closely   #T2DM Last HbA1c 5.6% 08/2022. Not on any outpatient therapy. 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  -No additional management at this time  Diet: Renal, resume renal diet after cath IVF: None,None VTE: SCDs Code: Full PT/OT recs: None, none.  Dispo: Anticipated discharge to Home in 1 days pending clinical improvement and cath results.   Modena Slater DO Internal Medicine Resident PGY-1 [phone] Please contact the on call pager after 5 pm and on weekends at [phone].

## 2022-12-13 NOTE — H&P (View-Only) (Signed)
 Rounding Note    Patient Name: Corey Park Date of Encounter: 12/13/2022  [facility] Cardiologist: Mark Skains, MD   Subjective   Still with "a little" chest pain.  Denies dyspnea  Inpatient Medications    Scheduled Meds:  aspirin EC  81 mg Oral Daily   atorvastatin  80 mg Oral Daily   calcium acetate  2,001 mg Oral TID with meals   carvedilol  25 mg Oral BID WC   gabapentin  100 mg Oral TID   irbesartan  150 mg Oral Daily   multivitamin  1 tablet Oral Daily   pantoprazole  40 mg Oral Daily   sodium chloride flush  3 mL Intravenous Q12H   Continuous Infusions:  sodium chloride     sodium chloride 10 mL/hr at 12/13/22 0546   PRN Meds: sodium chloride, acetaminophen **OR** acetaminophen, senna-docusate, sodium chloride flush   Vital Signs    Vitals:   12/12/22 1749 12/12/22 1816 12/12/22 1912 12/13/22 0544  BP: (!) 201/100  (!) 189/96 135/70  Pulse: 90  92 79  Resp: 18  13 13  Temp:  99 F (37.2 C) 99.6 F (37.6 C) 98.5 F (36.9 C)  TempSrc:  Oral Oral Oral  SpO2: 95%  91% 99%  Weight:  106.8 kg    Height:        Intake/Output Summary (Last 24 hours) at 12/13/2022 0742 Last data filed at 12/12/2022 1816 Gross per 24 hour  Intake --  Output 2500 ml  Net -2500 ml       12/12/2022    6:16 PM 12/12/2022    1:51 PM 12/11/2022   11:56 AM  Last 3 Weights  Weight (lbs) 235 lb 7.2 oz 240 lb 8.4 oz 241 lb 2.9 oz  Weight (kg) 106.8 kg 109.1 kg 109.4 kg      Telemetry    Sinus- Personally Reviewed  Physical Exam   GEN: NAD  Neck: supple Cardiac: RRR, no rub Respiratory: CTA GI: Soft, NT/ND MS: No edema Neuro:  grossly intact Psych: Normal affect   Labs    High Sensitivity Troponin:   Recent Labs  Lab 12/11/22 0441 12/11/22 0657  TROPONINIHS 435* 428*      Chemistry Recent Labs  Lab 12/11/22 0441 12/11/22 0657 12/12/22 1025  NA 137 140 138  K 4.3 4.6 4.9  CL 98 101 101  CO2 23 24 22  GLUCOSE 134* 122* 101*  BUN 77*  72* 86*  CREATININE 15.23* 15.41* 17.02*  CALCIUM 8.5* 8.4* 9.3  PROT 7.0  --   --   ALBUMIN 3.0* 3.0* 2.9*  AST 15  --   --   ALT 12  --   --   ALKPHOS 75  --   --   BILITOT 0.6  --   --   GFRNONAA 3* 3* 3*  ANIONGAP 16* 15 15     Lipids  Recent Labs  Lab 12/11/22 1126  CHOL 160  TRIG 111  HDL 31*  LDLCALC 107*  CHOLHDL 5.2     Hematology Recent Labs  Lab 12/11/22 0441 12/11/22 1126 12/12/22 1026  WBC 7.6 7.4 7.8  RBC 2.65* 2.76* 2.67*  HGB 8.3* 8.7* 8.3*  HCT 25.7* 26.8* 25.1*  MCV 97.0 97.1 94.0  MCH 31.3 31.5 31.1  MCHC 32.3 32.5 33.1  RDW 16.9* 16.8* 15.9*  PLT 168 164 157    Radiology    ECHOCARDIOGRAM COMPLETE  Result Date: 12/11/2022    ECHOCARDIOGRAM REPORT     Patient Name:   Corey Park Date of Exam: 12/11/2022 Medical Rec #:  [id number]       Height:       73.0 in Accession #:    [id number]      Weight:       241.2 lb Date of Birth:  08/03/1970       BSA:          2.330 m Patient Age:    51 years        BP:           169/92 mmHg Patient Gender: M               HR:           74 bpm. Exam Location:  Inpatient Procedure: 2D Echo, Cardiac Doppler and Color Doppler Indications:    Chest pain  History:        Patient has prior history of Echocardiogram examinations, most                 recent 04/17/2022. Signs/Symptoms:Chest Pain and Shortness of                 Breath; Risk Factors:Hypertension, Diabetes and Dyslipidemia.                 ESRD.  Sonographer:    Melissa Morford RDCS (AE, PE) Referring Phys: [id number] HAO MENG IMPRESSIONS  1. Left ventricular ejection fraction, by estimation, is 60 to 65%. The left ventricle has normal function. The left ventricle has no regional wall motion abnormalities. There is moderate concentric left ventricular hypertrophy. Left ventricular diastolic parameters are consistent with Grade II diastolic dysfunction (pseudonormalization).  2. Right ventricular systolic function is normal. The right ventricular size is normal.  3.  Left atrial size was mildly dilated.  4. The mitral valve is normal in structure. Trivial mitral valve regurgitation. No evidence of mitral stenosis.  5. The aortic valve is normal in structure. Aortic valve regurgitation is not visualized. No aortic stenosis is present.  6. The inferior vena cava is normal in size with <50% respiratory variability, suggesting right atrial pressure of 8 mmHg. FINDINGS  Left Ventricle: Left ventricular ejection fraction, by estimation, is 60 to 65%. The left ventricle has normal function. The left ventricle has no regional wall motion abnormalities. The left ventricular internal cavity size was normal in size. There is  moderate concentric left ventricular hypertrophy. Left ventricular diastolic parameters are consistent with Grade II diastolic dysfunction (pseudonormalization). Right Ventricle: The right ventricular size is normal. No increase in right ventricular wall thickness. Right ventricular systolic function is normal. Left Atrium: Left atrial size was mildly dilated. Right Atrium: Right atrial size was normal in size. Pericardium: There is no evidence of pericardial effusion. Mitral Valve: The mitral valve is normal in structure. Trivial mitral valve regurgitation. No evidence of mitral valve stenosis. Tricuspid Valve: The tricuspid valve is normal in structure. Tricuspid valve regurgitation is trivial. No evidence of tricuspid stenosis. Aortic Valve: The aortic valve is normal in structure. Aortic valve regurgitation is not visualized. No aortic stenosis is present. Pulmonic Valve: The pulmonic valve was normal in structure. Pulmonic valve regurgitation is mild. No evidence of pulmonic stenosis. Aorta: The aortic root is normal in size and structure. Venous: The inferior vena cava is normal in size with less than 50% respiratory variability, suggesting right atrial pressure of 8 mmHg. IAS/Shunts: No atrial level shunt detected by color flow Doppler.  LEFT VENTRICLE PLAX 2D    LVIDd:         5.80 cm      Diastology LVIDs:         3.90 cm      LV e' medial:    8.55 cm/s LV PW:         1.40 cm      LV E/e' medial:  13.6 LV IVS:        1.40 cm      LV e' lateral:   8.70 cm/s LVOT diam:     2.30 cm      LV E/e' lateral: 13.3 LV SV:         98 LV SV Index:   42 LVOT Area:     4.15 cm  LV Volumes (MOD) LV vol d, MOD A2C: 129.0 ml LV vol d, MOD A4C: 146.0 ml LV vol s, MOD A2C: 63.6 ml LV vol s, MOD A4C: 69.5 ml LV SV MOD A2C:     65.4 ml LV SV MOD A4C:     146.0 ml LV SV MOD BP:      78.9 ml RIGHT VENTRICLE RV S prime:     14.30 cm/s TAPSE (M-mode): 2.0 cm LEFT ATRIUM             Index        RIGHT ATRIUM           Index LA diam:        4.30 cm 1.85 cm/m   RA Area:     19.80 cm LA Vol (A2C):   48.5 ml 20.82 ml/m  RA Volume:   59.60 ml  25.58 ml/m LA Vol (A4C):   53.8 ml 23.09 ml/m LA Biplane Vol: 50.7 ml 21.76 ml/m  AORTIC VALVE LVOT Vmax:   108.00 cm/s LVOT Vmean:  77.800 cm/s LVOT VTI:    0.237 m  AORTA Ao Root diam: 3.50 cm Ao Asc diam:  3.30 cm MITRAL VALVE                TRICUSPID VALVE MV Area (PHT): 5.31 cm     TR Peak grad:   11.3 mmHg MV Decel Time: 143 msec     TR Vmax:        168.00 cm/s MV E velocity: 116.00 cm/s MV A velocity: 65.40 cm/s   SHUNTS MV E/A ratio:  1.77         Systemic VTI:  0.24 m                             Systemic Diam: 2.30 cm Daniel Bensimhon MD Electronically signed by Daniel Bensimhon MD Signature Date/Time: 12/11/2022/3:41:16 PM    Final       Patient Profile     51-year-old male with past medical history of end-stage renal disease dialysis dependent, history of cardiomyopathy improved by most recent echocardiogram, hypertension, diabetes mellitus for evaluation of recurrent chest pain. 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 Patient has been treated medically. However he has continued to have intermittent chest pain with multiple evaluations in the emergency room. Troponins are 435 and 428.    Echocardiogram this admission shows ejection fraction 60 to 65%, moderate biventricular hypertrophy, grade 2 diastolic dysfunction, mild left atrial enlargement.  Assessment & Plan    1 chest pain-as outlined previously this is a difficult situation.  Patient's symptoms are atypical but he has multiple ER evaluations   change evaluations for chest pain. Previous nuclear study suggested inferior infarct but no ischemia.  Previous CT showed coronary calcification. Follow-up echocardiogram this admission shows normal LV function although previous study showed inferior basal hypokinesis.  Troponin mildly elevated but no clear trend.  Electrocardiogram without diagnostic ST changes.  I previously discussed options with patient.  Will plan cardiac catheterization for definitive evaluation.  Risk and benefits including myocardial infarction, CVA and death previously discussed and he agrees to proceed.  His hemoglobin is unchanged. Continue aspirin and statin.   2 end-stage renal disease-nephrology is following. S/P dialysis last PM.   3 normocytic anemia-hemoglobin unchanged since admission   4 hypertension-continue present meds; BP improved following dialysis last PM.  For questions or updates, please contact [facility] Please consult [email or web address] for contact info under        Signed, Dellamae Rosamilia, MD  12/13/2022, 7:42 AM    

## 2022-12-13 NOTE — Progress Notes (Signed)
Site area: Right groin a 6 french arterial sheath was removed  Site Prior to Removal:  Level 0  Pressure Applied For 20 MINUTES    Bedrest Beginning at 1545pm x 4  Manual:   Yes.    Patient Status During Pull:  stable  Post Pull Groin Site:  Level 0  Post Pull Instructions Given:  Yes.    Post Pull Pulses Present:  Yes.    Dressing Applied:  Yes.    Comments:

## 2022-12-13 NOTE — Progress Notes (Signed)
Rounding Note    Patient Name: Corey Park Write Date of Encounter: 12/13/2022  [facility] Cardiologist: Donato Schultz, MD   Subjective   Still with "a little" chest pain.  Denies dyspnea  Inpatient Medications    Scheduled Meds:  aspirin EC  81 mg Oral Daily   atorvastatin  80 mg Oral Daily   calcium acetate  2,001 mg Oral TID with meals   carvedilol  25 mg Oral BID WC   gabapentin  100 mg Oral TID   irbesartan  150 mg Oral Daily   multivitamin  1 tablet Oral Daily   pantoprazole  40 mg Oral Daily   sodium chloride flush  3 mL Intravenous Q12H   Continuous Infusions:  sodium chloride     sodium chloride 10 mL/hr at 12/13/22 0546   PRN Meds: sodium chloride, acetaminophen **OR** acetaminophen, senna-docusate, sodium chloride flush   Vital Signs    Vitals:   12/12/22 1749 12/12/22 1816 12/12/22 1912 12/13/22 0544  BP: (!) 201/100  (!) 189/96 135/70  Pulse: 90  92 79  Resp: 18  13 13   Temp:  99 F (37.2 C) 99.6 F (37.6 C) 98.5 F (36.9 C)  TempSrc:  Oral Oral Oral  SpO2: 95%  91% 99%  Weight:  106.8 kg    Height:        Intake/Output Summary (Last 24 hours) at 12/13/2022 0742 Last data filed at 12/12/2022 1816 Gross per 24 hour  Intake --  Output 2500 ml  Net -2500 ml       12/12/2022    6:16 PM 12/12/2022    1:51 PM 12/11/2022   11:56 AM  Last 3 Weights  Weight (lbs) 235 lb 7.2 oz 240 lb 8.4 oz 241 lb 2.9 oz  Weight (kg) 106.8 kg 109.1 kg 109.4 kg      Telemetry    Sinus- Personally Reviewed  Physical Exam   GEN: NAD  Neck: supple Cardiac: RRR, no rub Respiratory: CTA GI: Soft, NT/ND MS: No edema Neuro:  grossly intact Psych: Normal affect   Labs    High Sensitivity Troponin:   Recent Labs  Lab 12/11/22 0441 12/11/22 0657  TROPONINIHS 435* 428*      Chemistry Recent Labs  Lab 12/11/22 0441 12/11/22 0657 12/12/22 1025  NA 137 140 138  K 4.3 4.6 4.9  CL 98 101 101  CO2 23 24 22   GLUCOSE 134* 122* 101*  BUN 77*  72* 86*  CREATININE 15.23* 15.41* 17.02*  CALCIUM 8.5* 8.4* 9.3  PROT 7.0  --   --   ALBUMIN 3.0* 3.0* 2.9*  AST 15  --   --   ALT 12  --   --   ALKPHOS 75  --   --   BILITOT 0.6  --   --   GFRNONAA 3* 3* 3*  ANIONGAP 16* 15 15     Lipids  Recent Labs  Lab 12/11/22 1126  CHOL 160  TRIG 111  HDL 31*  LDLCALC 107*  CHOLHDL 5.2     Hematology Recent Labs  Lab 12/11/22 0441 12/11/22 1126 12/12/22 1026  WBC 7.6 7.4 7.8  RBC 2.65* 2.76* 2.67*  HGB 8.3* 8.7* 8.3*  HCT 25.7* 26.8* 25.1*  MCV 97.0 97.1 94.0  MCH 31.3 31.5 31.1  MCHC 32.3 32.5 33.1  RDW 16.9* 16.8* 15.9*  PLT 168 164 157    Radiology    ECHOCARDIOGRAM COMPLETE  Result Date: 12/11/2022    ECHOCARDIOGRAM REPORT  Patient Name:   Corey Park Date of Exam: 12/11/2022 Medical Rec #:  [id number]       Height:       73.0 in Accession #:    [id number]      Weight:       241.2 lb Date of Birth:  [birth year]       BSA:          2.330 m Patient Age:    51 years        BP:           169/92 mmHg Patient Gender: M               HR:           74 bpm. Exam Location:  Inpatient Procedure: 2D Echo, Cardiac Doppler and Color Doppler Indications:    Chest pain  History:        Patient has prior history of Echocardiogram examinations, most                 recent 04/17/2022. Signs/Symptoms:Chest Pain and Shortness of                 Breath; Risk Factors:Hypertension, Diabetes and Dyslipidemia.                 ESRD.  Sonographer:    Melissa Morford RDCS (AE, PE) Referring Phys: [phone] HAO MENG IMPRESSIONS  1. Left ventricular ejection fraction, by estimation, is 60 to 65%. The left ventricle has normal function. The left ventricle has no regional wall motion abnormalities. There is moderate concentric left ventricular hypertrophy. Left ventricular diastolic parameters are consistent with Grade II diastolic dysfunction (pseudonormalization).  2. Right ventricular systolic function is normal. The right ventricular size is normal.  3.  Left atrial size was mildly dilated.  4. The mitral valve is normal in structure. Trivial mitral valve regurgitation. No evidence of mitral stenosis.  5. The aortic valve is normal in structure. Aortic valve regurgitation is not visualized. No aortic stenosis is present.  6. The inferior vena cava is normal in size with <50% respiratory variability, suggesting right atrial pressure of 8 mmHg. FINDINGS  Left Ventricle: Left ventricular ejection fraction, by estimation, is 60 to 65%. The left ventricle has normal function. The left ventricle has no regional wall motion abnormalities. The left ventricular internal cavity size was normal in size. There is  moderate concentric left ventricular hypertrophy. Left ventricular diastolic parameters are consistent with Grade II diastolic dysfunction (pseudonormalization). Right Ventricle: The right ventricular size is normal. No increase in right ventricular wall thickness. Right ventricular systolic function is normal. Left Atrium: Left atrial size was mildly dilated. Right Atrium: Right atrial size was normal in size. Pericardium: There is no evidence of pericardial effusion. Mitral Valve: The mitral valve is normal in structure. Trivial mitral valve regurgitation. No evidence of mitral valve stenosis. Tricuspid Valve: The tricuspid valve is normal in structure. Tricuspid valve regurgitation is trivial. No evidence of tricuspid stenosis. Aortic Valve: The aortic valve is normal in structure. Aortic valve regurgitation is not visualized. No aortic stenosis is present. Pulmonic Valve: The pulmonic valve was normal in structure. Pulmonic valve regurgitation is mild. No evidence of pulmonic stenosis. Aorta: The aortic root is normal in size and structure. Venous: The inferior vena cava is normal in size with less than 50% respiratory variability, suggesting right atrial pressure of 8 mmHg. IAS/Shunts: No atrial level shunt detected by color flow Doppler.  LEFT VENTRICLE PLAX 2D  LVIDd:         5.80 cm      Diastology LVIDs:         3.90 cm      LV e' medial:    8.55 cm/s LV PW:         1.40 cm      LV E/e' medial:  13.6 LV IVS:        1.40 cm      LV e' lateral:   8.70 cm/s LVOT diam:     2.30 cm      LV E/e' lateral: 13.3 LV SV:         98 LV SV Index:   42 LVOT Area:     4.15 cm  LV Volumes (MOD) LV vol d, MOD A2C: 129.0 ml LV vol d, MOD A4C: 146.0 ml LV vol s, MOD A2C: 63.6 ml LV vol s, MOD A4C: 69.5 ml LV SV MOD A2C:     65.4 ml LV SV MOD A4C:     146.0 ml LV SV MOD BP:      78.9 ml RIGHT VENTRICLE RV S prime:     14.30 cm/s TAPSE (M-mode): 2.0 cm LEFT ATRIUM             Index        RIGHT ATRIUM           Index LA diam:        4.30 cm 1.85 cm/m   RA Area:     19.80 cm LA Vol (A2C):   48.5 ml 20.82 ml/m  RA Volume:   59.60 ml  25.58 ml/m LA Vol (A4C):   53.8 ml 23.09 ml/m LA Biplane Vol: 50.7 ml 21.76 ml/m  AORTIC VALVE LVOT Vmax:   108.00 cm/s LVOT Vmean:  77.800 cm/s LVOT VTI:    0.237 m  AORTA Ao Root diam: 3.50 cm Ao Asc diam:  3.30 cm MITRAL VALVE                TRICUSPID VALVE MV Area (PHT): 5.31 cm     TR Peak grad:   11.3 mmHg MV Decel Time: 143 msec     TR Vmax:        168.00 cm/s MV E velocity: 116.00 cm/s MV A velocity: 65.40 cm/s   SHUNTS MV E/A ratio:  1.77         Systemic VTI:  0.24 m                             Systemic Diam: 2.30 cm Arvilla Meres MD Electronically signed by Arvilla Meres MD Signature Date/Time: 12/11/2022/3:41:16 PM    Final       Patient Profile     52 year old male with past medical history of end-stage renal disease dialysis dependent, history of cardiomyopathy improved by most recent echocardiogram, hypertension, diabetes mellitus for evaluation of recurrent chest pain. 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 Patient has been treated medically. However he has continued to have intermittent chest pain with multiple evaluations in the emergency room. Troponins are 435 and 428.    Echocardiogram this admission shows ejection fraction 60 to 65%, moderate biventricular hypertrophy, grade 2 diastolic dysfunction, mild left atrial enlargement.  Assessment & Plan    1 chest pain-as outlined previously this is a difficult situation.  Patient's symptoms are atypical but he has multiple ER evaluations  change evaluations for chest pain. Previous nuclear study suggested inferior infarct but no ischemia.  Previous CT showed coronary calcification. Follow-up echocardiogram this admission shows normal LV function although previous study showed inferior basal hypokinesis.  Troponin mildly elevated but no clear trend.  Electrocardiogram without diagnostic ST changes.  I previously discussed options with patient.  Will plan cardiac catheterization for definitive evaluation.  Risk and benefits including myocardial infarction, CVA and death previously discussed and he agrees to proceed.  His hemoglobin is unchanged. Continue aspirin and statin.   2 end-stage renal disease-nephrology is following. S/P dialysis last PM.   3 normocytic anemia-hemoglobin unchanged since admission   4 hypertension-continue present meds; BP improved following dialysis last PM.  For questions or updates, please contact [facility] Please consult [email or web address] for contact info under        Signed, Olga Millers, MD  12/13/2022, 7:42 AM

## 2022-12-14 ENCOUNTER — Other Ambulatory Visit (HOSPITAL_COMMUNITY): Payer: Self-pay

## 2022-12-14 ENCOUNTER — Encounter (HOSPITAL_COMMUNITY): Payer: Self-pay | Admitting: Interventional Cardiology

## 2022-12-14 ENCOUNTER — Telehealth (HOSPITAL_COMMUNITY): Payer: Self-pay

## 2022-12-14 DIAGNOSIS — R0789 Other chest pain: Secondary | ICD-10-CM | POA: Diagnosis not present

## 2022-12-14 LAB — RENAL FUNCTION PANEL
Albumin: 2.9 g/dL — ABNORMAL LOW (ref 3.5–5.0)
Anion gap: 12 (ref 5–15)
BUN: 37 mg/dL — ABNORMAL HIGH (ref 6–20)
CO2: 26 mmol/L (ref 22–32)
Calcium: 8.5 mg/dL — ABNORMAL LOW (ref 8.9–10.3)
Chloride: 96 mmol/L — ABNORMAL LOW (ref 98–111)
Creatinine, Ser: 8.45 mg/dL — ABNORMAL HIGH (ref 0.61–1.24)
GFR, Estimated: 7 mL/min — ABNORMAL LOW (ref >60)
Glucose, Bld: 97 mg/dL (ref 70–99)
Phosphorus: 4.4 mg/dL (ref 2.5–4.6)
Potassium: 4.5 mmol/L (ref 3.5–5.1)
Sodium: 134 mmol/L — ABNORMAL LOW (ref 135–145)

## 2022-12-14 LAB — BASIC METABOLIC PANEL
Anion gap: 19 — ABNORMAL HIGH (ref 5–15)
BUN: 67 mg/dL — ABNORMAL HIGH (ref 6–20)
CO2: 25 mmol/L (ref 22–32)
Calcium: 8.9 mg/dL (ref 8.9–10.3)
Chloride: 92 mmol/L — ABNORMAL LOW (ref 98–111)
Creatinine, Ser: 13.14 mg/dL — ABNORMAL HIGH (ref 0.61–1.24)
GFR, Estimated: 4 mL/min — ABNORMAL LOW (ref >60)
Glucose, Bld: 112 mg/dL — ABNORMAL HIGH (ref 70–99)
Potassium: 5 mmol/L (ref 3.5–5.1)
Sodium: 136 mmol/L (ref 135–145)

## 2022-12-14 LAB — CBC
HCT: 22.5 % — ABNORMAL LOW (ref 39.0–52.0)
Hemoglobin: 7.4 g/dL — ABNORMAL LOW (ref 13.0–17.0)
MCH: 31.2 pg (ref 26.0–34.0)
MCHC: 32.9 g/dL (ref 30.0–36.0)
MCV: 94.9 fL (ref 80.0–100.0)
Platelets: 155 10*3/uL (ref 150–400)
RBC: 2.37 MIL/uL — ABNORMAL LOW (ref 4.22–5.81)
RDW: 15.4 % (ref 11.5–15.5)
WBC: 6.6 10*3/uL (ref 4.0–10.5)
nRBC: 0 % (ref 0.0–0.2)

## 2022-12-14 MED ORDER — PANTOPRAZOLE SODIUM 40 MG PO TBEC
40.0000 mg | DELAYED_RELEASE_TABLET | Freq: Every day | ORAL | 0 refills | Status: DC
  Filled 2022-12-14 (×2): qty 30, 30d supply, fill #0

## 2022-12-14 MED ORDER — FERRIC CITRATE 1 GM 210 MG(FE) PO TABS
420.0000 mg | ORAL_TABLET | Freq: Every day | ORAL | 0 refills | Status: AC
  Filled 2022-12-14: qty 60, 30d supply, fill #0

## 2022-12-14 MED ORDER — ATORVASTATIN CALCIUM 80 MG PO TABS
80.0000 mg | ORAL_TABLET | Freq: Every day | ORAL | 0 refills | Status: DC
  Filled 2022-12-14: qty 30, 30d supply, fill #0

## 2022-12-14 MED ORDER — CARVEDILOL 25 MG PO TABS
25.0000 mg | ORAL_TABLET | Freq: Two times a day (BID) | ORAL | 0 refills | Status: DC
  Filled 2022-12-14 (×2): qty 60, 30d supply, fill #0

## 2022-12-14 MED ORDER — OLMESARTAN MEDOXOMIL 20 MG PO TABS
20.0000 mg | ORAL_TABLET | Freq: Every day | ORAL | 0 refills | Status: DC
  Filled 2022-12-14 (×2): qty 30, 30d supply, fill #0

## 2022-12-14 MED ORDER — ASPIRIN 81 MG PO TBEC
81.0000 mg | DELAYED_RELEASE_TABLET | Freq: Every day | ORAL | 0 refills | Status: DC
  Filled 2022-12-14: qty 120, 120d supply, fill #0

## 2022-12-14 MED ORDER — CALCIUM ACETATE (PHOS BINDER) 667 MG PO CAPS
1334.0000 mg | ORAL_CAPSULE | Freq: Three times a day (TID) | ORAL | 0 refills | Status: DC
  Filled 2022-12-14: qty 180, 30d supply, fill #0

## 2022-12-14 MED ORDER — GABAPENTIN 100 MG PO CAPS
100.0000 mg | ORAL_CAPSULE | Freq: Three times a day (TID) | ORAL | 0 refills | Status: DC
  Filled 2022-12-14 (×2): qty 90, 30d supply, fill #0

## 2022-12-14 MED FILL — Fentanyl Citrate Preservative Free (PF) Inj 100 MCG/2ML: INTRAMUSCULAR | Qty: 2 | Status: AC

## 2022-12-14 NOTE — Progress Notes (Signed)
CSW received consult for patient. CSW spoke with patient at bedside. Patient request assistance with transportation. CSW verifed address with patient that patient will dc too. Patient signed rider waiver form. Rider waiver place in patients hard chart. All questions answered. No further questions reported at this time.CSW provided cab voucher to patients RN.

## 2022-12-14 NOTE — Progress Notes (Signed)
Contacted by attending that pt will d/c today. Contacted [facility] to advise clinic of pt's d/c today and that pt should resume care tomorrow.   Olivia Canter Renal Navigator [phone]

## 2022-12-14 NOTE — Discharge Summary (Signed)
Name: Corey Park MRN: [id number] DOB: [birth year] 52 y.o. PCP: [name], NP  Date of Admission: 12/11/2022  4:32 AM Date of Discharge: 12/14/2022 Attending Physician: Dr. Lafonda Mosses   Discharge Diagnosis: Principal Problem:   Atypical chest pain Active Problems:   DM (diabetes mellitus), type 2 (HCC)   HLD (hyperlipidemia)   Elevated troponin   ESRD on dialysis (HCC)   Anemia    Discharge Medications: Allergies as of 12/14/2022   No Known Allergies      Medication List     STOP taking these medications    rosuvastatin 10 MG tablet Commonly known as: CRESTOR       TAKE these medications    aspirin EC 81 MG tablet Take 1 tablet (81 mg total) by mouth daily. Swallow whole.   atorvastatin 80 MG tablet Commonly known as: LIPITOR Take 1 tablet (80 mg total) by mouth daily. Start taking on: December 15, 2022   calcium acetate 667 MG capsule Commonly known as: PHOSLO Take 2 capsules (1,334 mg total) by mouth with breakfast, with lunch, and with evening meal. What changed: Another medication with the same name was removed. Continue taking this medication, and follow the directions you see here.   carvedilol 25 MG tablet Commonly known as: COREG Take 1 tablet (25 mg total) by mouth 2 (two) times daily with a meal.   CertaVite/Antioxidants Tabs Take 1 tablet by mouth at bedtime.   diclofenac Sodium 1 % Gel Commonly known as: VOLTAREN Apply 2 g topically 4 (four) times daily as needed (Pain).   ferric citrate 1 GM 210 MG(Fe) tablet Commonly known as: Auryxia Take 2 tablets (420 mg total) by mouth daily.   gabapentin 100 MG capsule Commonly known as: Neurontin Take 1 capsule (100 mg total) by mouth 3 (three) times daily.   multivitamin Tabs tablet Take 1 tablet by mouth daily.   olmesartan 20 MG tablet Commonly known as: BENICAR Take 1 tablet (20 mg total) by mouth daily.   pantoprazole 40 MG tablet Commonly known as: PROTONIX Take 1 tablet (40 mg  total) by mouth daily.        Disposition and follow-up:   [name] was discharged from [facility] in Stable condition.  At the hospital follow up visit please address:  1.  Follow-up:  A.  Atypical chest pain: Ruled out ACS during hospitalization.  Coronary cath did show severe coronary calcification.  Continue with medical management.  Ensure patient has follow-up with cardiologist.  Ensure refills on atorvastatin and aspirin given.  B.  Hypertension: Patient was hypertensive on nondialysis days, on dialysis days, patient did have normal blood pressures.  Continue to titrate blood pressure medications outpatient.  C.  Anemia of chronic disease: Patient did have decreased hemoglobin during hospitalization.  Repeat CBC on follow-up.  No concern for GI bleed at this time.  2.  Labs / imaging needed at time of follow-up: CBC, BMP  3.  Pending labs/ test needing follow-up: N/A  4.  Medication Changes  Patient changed from rosuvastatin to atorvastatin 80 mg daily.  Follow-up Appointments:  Follow-up Information     [name]., NP Follow up on 12/29/2022.   Specialty: Cardiology Why: Appointement at 10:55 AM Contact information: [address] [place] [address] [phone]         [name], NP. Schedule an appointment as soon as possible for a visit in 1 week(s).   Specialty: Nurse Practitioner  Contact information: [address] [employer] [address] [place] [address] [phone]                 Hospital Course by problem list: This is a 52 year old male with past medical history of hypertension, type 2 diabetes, ESRD on dialysis Monday, Wednesday, Friday who presents for concerns of atypical chest pain found to have hemoglobin patient admitted for further evaluation and management of atypical chest pain as well as acute on chronic anemia of chronic kidney disease.   #Atypical chest pain #Hx  CAD #Elevated troponin Patient initially presented to the emergency department with concerns of nocturnal chest pain.  He described it as a squeezing sensation that was worse when he laid down.  He denied any exertional chest pain.  Patient also had elevated troponin in the 400s.  Given this concern, patient was admitted for further evaluation management.  Initially heparin was started, but discontinued.  There was some concern about pericarditis that was less likely.  Patient repeat echo which did not show any new wall motion abnormalities or other concerns.  Urology follow-up, who catheterized the patient during hospitalization which showed severe coronary calcification with nonobstructive disease throughout the main left, LAD, and left circumflex.  Repeat cholesterol panel showing elevated LDL, and patient was transitioned from Crestor to atorvastatin 80 mg daily.  Patient continued aspirin.  Patient discharged on atorvastatin 80 milligrams daily and aspirin 81 mg daily.  #Acute on chronic anemia of chronic kidney disease, stabilized On presentation to the emergency department, patient was found to have decreased hemoglobin from baseline at 8.3.  Patient is unable to comment on if he has not been having any melanotic stools or any blood per rectum as he is blind.  During hospitalization hemoglobin remained stable.  Thought was this was likely due to dilution.  Patient had normal bowel movements during hospitalization.   #ESRD on MWF HD During hospitalization patient received dialysis per nephrology.  On discharge, patient can continue dialysis outpatient.   #HFrecEF During hospitalization, repeat echo did show ejection fraction of 60 to 65%.  No new wall motion abnormalities.  No acute concern for exacerbation during hospitalization.  Patient continued Coreg and home ARB and discharged on the same.     #HTN During hospitalization, patient did have elevated blood pressures with systolics into the  200s.  Patient was resumed on home carvedilol and home ARB. Did not titrate blood pressure medication in hospital as patient is on dialysis, and with dialysis blood pressure did come back to within normal limits.    #T2DM Last HbA1c 5.6% 08/2022. Not on any outpatient therapy.  Glucose measured within normal limits during hospitalization.   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  No acute concerns during hospitalization.  Discharge Subjective:  Patient evaluated bedside this morning.  He has no concerns this morning.  He states he is ready go home.  He denies any chest pain, shortness of breath, or any other concerns this morning.  Discharge Exam:   BP 131/81   Pulse 74   Temp 98.2 F (36.8 C) (Oral)   Resp 17   Ht 6\' 1"  (1.854 m)   Wt 106.8 kg   SpO2 97%   BMI 31.06 kg/m  Constitutional: Resting in bed no acute distress HENT: normocephalic atraumatic Cardiovascular: regular rate and rhythm, no m/r/g Pulmonary/Chest: normal work of breathing on room air, lungs clear to auscultation bilaterally MSK: normal bulk and tone  Extremities: No pitting edema noted to bilateral  lower extremities   Pertinent Labs, Studies, and Procedures:     Latest Ref Rng & Units 12/14/2022   12:30 AM 12/12/2022   10:26 AM 12/11/2022   11:26 AM  CBC  WBC 4.0 - 10.5 K/uL 6.6  7.8  7.4   Hemoglobin 13.0 - 17.0 g/dL 7.4  8.3  8.7   Hematocrit 39.0 - 52.0 % 22.5  25.1  26.8   Platelets 150 - 400 K/uL 155  157  164        Latest Ref Rng & Units 12/14/2022    7:56 AM 12/14/2022   12:30 AM 12/12/2022   10:25 AM  CMP  Glucose 70 - 99 mg/dL 97  191  478   BUN 6 - 20 mg/dL 37  67  86   Creatinine 0.61 - 1.24 mg/dL 2.95  62.13  08.65   Sodium 135 - 145 mmol/L 134  136  138   Potassium 3.5 - 5.1 mmol/L 4.5  5.0  4.9   Chloride 98 - 111 mmol/L 96  92  101   CO2 22 - 32 mmol/L 26  25  22    Calcium 8.9 - 10.3 mg/dL 8.5  8.9  9.3     ECHOCARDIOGRAM COMPLETE  Result Date:  12/11/2022    ECHOCARDIOGRAM REPORT   Patient Name:   ALIX DEDIOS Date of Exam: 12/11/2022 Medical Rec #:  [id number]       Height:       73.0 in Accession #:    [id number]      Weight:       241.2 lb Date of Birth:  01-Dec-1970       BSA:          2.330 m Patient Age:    51 years        BP:           169/92 mmHg Patient Gender: M               HR:           74 bpm. Exam Location:  Inpatient Procedure: 2D Echo, Cardiac Doppler and Color Doppler Indications:    Chest pain  History:        Patient has prior history of Echocardiogram examinations, most                 recent 04/17/2022. Signs/Symptoms:Chest Pain and Shortness of                 Breath; Risk Factors:Hypertension, Diabetes and Dyslipidemia.                 ESRD.  Sonographer:    Melissa Morford RDCS (AE, PE) Referring Phys: [phone] HAO MENG IMPRESSIONS  1. Left ventricular ejection fraction, by estimation, is 60 to 65%. The left ventricle has normal function. The left ventricle has no regional wall motion abnormalities. There is moderate concentric left ventricular hypertrophy. Left ventricular diastolic parameters are consistent with Grade II diastolic dysfunction (pseudonormalization).  2. Right ventricular systolic function is normal. The right ventricular size is normal.  3. Left atrial size was mildly dilated.  4. The mitral valve is normal in structure. Trivial mitral valve regurgitation. No evidence of mitral stenosis.  5. The aortic valve is normal in structure. Aortic valve regurgitation is not visualized. No aortic stenosis is present.  6. The inferior vena cava is normal in size with <50% respiratory variability, suggesting right atrial pressure of 8 mmHg. FINDINGS  Left Ventricle: Left  ventricular ejection fraction, by estimation, is 60 to 65%. The left ventricle has normal function. The left ventricle has no regional wall motion abnormalities. The left ventricular internal cavity size was normal in size. There is  moderate concentric left  ventricular hypertrophy. Left ventricular diastolic parameters are consistent with Grade II diastolic dysfunction (pseudonormalization). Right Ventricle: The right ventricular size is normal. No increase in right ventricular wall thickness. Right ventricular systolic function is normal. Left Atrium: Left atrial size was mildly dilated. Right Atrium: Right atrial size was normal in size. Pericardium: There is no evidence of pericardial effusion. Mitral Valve: The mitral valve is normal in structure. Trivial mitral valve regurgitation. No evidence of mitral valve stenosis. Tricuspid Valve: The tricuspid valve is normal in structure. Tricuspid valve regurgitation is trivial. No evidence of tricuspid stenosis. Aortic Valve: The aortic valve is normal in structure. Aortic valve regurgitation is not visualized. No aortic stenosis is present. Pulmonic Valve: The pulmonic valve was normal in structure. Pulmonic valve regurgitation is mild. No evidence of pulmonic stenosis. Aorta: The aortic root is normal in size and structure. Venous: The inferior vena cava is normal in size with less than 50% respiratory variability, suggesting right atrial pressure of 8 mmHg. IAS/Shunts: No atrial level shunt detected by color flow Doppler.  LEFT VENTRICLE PLAX 2D LVIDd:         5.80 cm      Diastology LVIDs:         3.90 cm      LV e' medial:    8.55 cm/s LV PW:         1.40 cm      LV E/e' medial:  13.6 LV IVS:        1.40 cm      LV e' lateral:   8.70 cm/s LVOT diam:     2.30 cm      LV E/e' lateral: 13.3 LV SV:         98 LV SV Index:   42 LVOT Area:     4.15 cm  LV Volumes (MOD) LV vol d, MOD A2C: 129.0 ml LV vol d, MOD A4C: 146.0 ml LV vol s, MOD A2C: 63.6 ml LV vol s, MOD A4C: 69.5 ml LV SV MOD A2C:     65.4 ml LV SV MOD A4C:     146.0 ml LV SV MOD BP:      78.9 ml RIGHT VENTRICLE RV S prime:     14.30 cm/s TAPSE (M-mode): 2.0 cm LEFT ATRIUM             Index        RIGHT ATRIUM           Index LA diam:        4.30 cm 1.85 cm/m    RA Area:     19.80 cm LA Vol (A2C):   48.5 ml 20.82 ml/m  RA Volume:   59.60 ml  25.58 ml/m LA Vol (A4C):   53.8 ml 23.09 ml/m LA Biplane Vol: 50.7 ml 21.76 ml/m  AORTIC VALVE LVOT Vmax:   108.00 cm/s LVOT Vmean:  77.800 cm/s LVOT VTI:    0.237 m  AORTA Ao Root diam: 3.50 cm Ao Asc diam:  3.30 cm MITRAL VALVE                TRICUSPID VALVE MV Area (PHT): 5.31 cm     TR Peak grad:   11.3 mmHg MV Decel Time: 143 msec  TR Vmax:        168.00 cm/s MV E velocity: 116.00 cm/s MV A velocity: 65.40 cm/s   SHUNTS MV E/A ratio:  1.77         Systemic VTI:  0.24 m                             Systemic Diam: 2.30 cm Arvilla Meres MD Electronically signed by Arvilla Meres MD Signature Date/Time: 12/11/2022/3:41:16 PM    Final    DG Chest Portable 1 View  Result Date: 12/11/2022 CLINICAL DATA:  Fever and chest pain. EXAM: PORTABLE CHEST 1 VIEW COMPARISON:  10/19/2022 FINDINGS: The cardio pericardial silhouette is enlarged. There is pulmonary vascular congestion without overt pulmonary edema. Basilar atelectasis with no substantial pleural effusion. No acute bony abnormality. Telemetry leads overlie the chest. IMPRESSION: 1. Enlargement of the cardiopericardial silhouette with pulmonary vascular congestion. 2. Basilar atelectasis. Electronically Signed   By: Kennith Center M.D.   On: 12/11/2022 06:08     Discharge Instructions: Discharge Instructions     AMB Referral to Cardiac Rehabilitation - Phase II   Complete by: As directed    Elevated troponin   Diagnosis: Other   After initial evaluation and assessments completed: Virtual Based Care may be provided alone or in conjunction with Phase 2 Cardiac Rehab based on patient barriers.: Yes   Intensive Cardiac Rehabilitation (ICR) [facility] location only OR Traditional Cardiac Rehabilitation (TCR) *If criteria for ICR are not met will enroll in TCR [facility] only): Yes   AMB Referral to Community Care Coordinaton (ACO Patients)   Complete by: As directed     Hospital Follow up referral/Care Coordination:  Extreme high risk member; HD MWF [facility] 4 admission in 6 months  Primary Care Provider: [name], NP with [facility] plan: Medicare   Please assign to [facility] RN Care Coordinator for post hospital care coordination for readmission prevention follow up calls and assess for further needs.  Questions please call:   Charlesetta Shanks, RN BSN CCM [facility]  [phone] business mobile phone Toll free office [phone]  *Concierge Line  [phone] Fax number: [phone] [email or web address]   Reason for Referral: Care Coordination (ACO patients)   Disease managment services needed: Nurse Case Manager   Diagnoses of:  Heart Failure Kidney Failure     Expected date of contact: Urgent - 7 Days   Call MD for:  difficulty breathing, headache or visual disturbances   Complete by: As directed    Call MD for:  extreme fatigue   Complete by: As directed    Call MD for:  persistant dizziness or light-headedness   Complete by: As directed    Call MD for:  persistant nausea and vomiting   Complete by: As directed    Call MD for:  temperature >100.4   Complete by: As directed    Diet - low sodium heart healthy   Complete by: As directed    Discharge instructions   Complete by: As directed    Mr. [name], It was a pleasure taking care of you at [facility]. You were admitted for chest pain and had a catheterization which showed some blockages but still recommending medical management. We are discharging you home now that you are doing better. Please follow the following instructions.   1) Regarding your chest pain, we think this is more related  to these blockages but no need for a procedure at this time. Please continue taking Asprin 81 mg when you are discharged. We have switched you to Atorvastatin 80  mg daily for your cholesterol. Please stop taking the rosuvastatin. Follow up with your heart doctor.   2) Please follow-up with your primary care physician in 1 week for hospital follow-up.  3) Do not miss any dialysis sessions.  Please go regularly to your sessions on Monday, Wednesday, and Friday.  4) If you develop chest pain that does not resolve, with associated shortness of breath please return back to the emergency department.  Take Care, Dr. Modena Slater, D.O.   Increase activity slowly   Complete by: As directed    No wound care   Complete by: As directed        Signed: Modena Slater, DO 12/14/2022, 3:25 PM   Pager: [phone]

## 2022-12-14 NOTE — Consult Note (Addendum)
   [facility] CM Inpatient Consult   12/14/2022  Corey Park 1971-03-30 [id number]  [facility] [[facility]]  Accountable Care Organization [ACO] Patient:  Medicare ACO REACH  Primary Care Provider:  [name], NP [place] [facility]    Patient screened for 4 hospitalization in 6 months with noted extreme high risk score for unplanned readmission risk  to assess for potential [facility]  [[facility]] Care Management service needs for post hospital transition for care coordination.  Review of patient's electronic medical record reveals patient is for home, has HD MWF at [facility] patient had already transitioned home and was able to leave a HIPAA appropriate voicemail requesting a return call.   Plan:  Refer for community care coordination follow up due to extreme high risk score for unplanned readmission for readmission prevention measures due to chronic disease needs.  Of note, [facility] Care Management/Population Health does not replace or interfere with any arrangements made by the Inpatient Transition of Care team.  For questions contact:   Charlesetta Shanks, RN BSN CCM [facility]  [phone] business mobile phone Toll free office [phone]  *Concierge Line  [phone] Fax number: [phone] [name]

## 2022-12-14 NOTE — Progress Notes (Signed)
CARDIAC REHAB PHASE I   CRP1 consult received, unable to find appropriate diagnosis for CRP2 at this time.  CRP2 referral can be placed via office for stable angina moving forward. Pt has several co morbidities that may also hinder his ability to participate with CRP2 program. CRP1 available upon request if pt status changes.    [id number]     Woodroe Chen, RN BSN 12/14/2022 9:22 AM

## 2022-12-14 NOTE — Progress Notes (Signed)
Rounding Note    Patient Name: Corey Park Date of Encounter: 12/14/2022  [facility] Cardiologist: Donato Schultz, MD   Subjective   No CP; no dyspnea  Inpatient Medications    Scheduled Meds:  aspirin  81 mg Oral Daily   aspirin EC  81 mg Oral Daily   atorvastatin  80 mg Oral Daily   calcium acetate  2,001 mg Oral TID with meals   carvedilol  25 mg Oral BID WC   Chlorhexidine Gluconate Cloth  6 each Topical Q0600   gabapentin  100 mg Oral TID   heparin  5,000 Units Subcutaneous Q8H   irbesartan  150 mg Oral Daily   multivitamin  1 tablet Oral Daily   pantoprazole  40 mg Oral Daily   sodium chloride flush  3 mL Intravenous Q12H   Continuous Infusions:  sodium chloride     anticoagulant sodium citrate     PRN Meds: sodium chloride, acetaminophen **OR** acetaminophen, acetaminophen, alteplase, anticoagulant sodium citrate, heparin, heparin, lidocaine (PF), lidocaine-prilocaine, ondansetron (ZOFRAN) IV, pentafluoroprop-tetrafluoroeth, senna-docusate, sodium chloride flush   Vital Signs    Vitals:   12/14/22 0200 12/14/22 0230 12/14/22 0300 12/14/22 0346  BP: 118/62 (!) 148/76 139/78 124/82  Pulse: 80 69 74 74  Resp: 20 20 (!) 21 (!) 21  Temp:    98.2 F (36.8 C)  TempSrc:    Oral  SpO2: 100% 100%  100%  Weight:      Height:        Intake/Output Summary (Last 24 hours) at 12/14/2022 0750 Last data filed at 12/14/2022 0346 Gross per 24 hour  Intake --  Output 2750 ml  Net -2750 ml       12/12/2022    6:16 PM 12/12/2022    1:51 PM 12/11/2022   11:56 AM  Last 3 Weights  Weight (lbs) 235 lb 7.2 oz 240 lb 8.4 oz 241 lb 2.9 oz  Weight (kg) 106.8 kg 109.1 kg 109.4 kg      Telemetry    Sinus- Personally Reviewed  Physical Exam   GEN: NAD WD Neck: supple, no JVD Cardiac: RRR, no gallop Respiratory: CTA; no wheeze GI: Soft, NT/ND, no masses MS: No edema; femoral cath site with no hematoma and no bruit; mild tenderness Neuro:  no focal  findings Psych: Normal affect   Labs    High Sensitivity Troponin:   Recent Labs  Lab 12/11/22 0441 12/11/22 0657  TROPONINIHS 435* 428*      Chemistry Recent Labs  Lab 12/11/22 0441 12/11/22 0657 12/12/22 1025 12/14/22 0030  NA 137 140 138 136  K 4.3 4.6 4.9 5.0  CL 98 101 101 92*  CO2 23 24 22 25   GLUCOSE 134* 122* 101* 112*  BUN 77* 72* 86* 67*  CREATININE 15.23* 15.41* 17.02* 13.14*  CALCIUM 8.5* 8.4* 9.3 8.9  PROT 7.0  --   --   --   ALBUMIN 3.0* 3.0* 2.9*  --   AST 15  --   --   --   ALT 12  --   --   --   ALKPHOS 75  --   --   --   BILITOT 0.6  --   --   --   GFRNONAA 3* 3* 3* 4*  ANIONGAP 16* 15 15 19*     Lipids  Recent Labs  Lab 12/11/22 1126  CHOL 160  TRIG 111  HDL 31*  LDLCALC 107*  CHOLHDL 5.2  Hematology Recent Labs  Lab 12/11/22 1126 12/12/22 1026 12/14/22 0030  WBC 7.4 7.8 6.6  RBC 2.76* 2.67* 2.37*  HGB 8.7* 8.3* 7.4*  HCT 26.8* 25.1* 22.5*  MCV 97.1 94.0 94.9  MCH 31.5 31.1 31.2  MCHC 32.5 33.1 32.9  RDW 16.8* 15.9* 15.4  PLT 164 157 155    Radiology    CARDIAC CATHETERIZATION  Result Date: 12/13/2022   Dist LM to Prox LAD lesion is 25% stenosed.  Cross-sectional area of the left main by intravascular ultrasound was 8.1 mm.   Prox LAD lesion is 50% stenosed.  Cross-sectional area of this lesion by intravascular ultrasound was 6.2 mm.   Mid LAD lesion is 25% stenosed.   Ost Cx to Prox Cx lesion is 25% stenosed.   The left ventricular systolic function is normal.   LV end diastolic pressure is mildly elevated.   The left ventricular ejection fraction is 55-65% by visual estimate.   There is no aortic valve stenosis.   In the absence of any other complications or medical issues, we expect the patient to be ready for discharge from a cath perspective on 12/14/2022.   Recommend Aspirin 81mg  daily for moderate CAD. Severe coronary calcification.  Nonobstructive disease throughout the left main, LAD and circumflex.  Continue  medical therapy.      Patient Profile     52 year old male with past medical history of end-stage renal disease dialysis dependent, history of cardiomyopathy improved by most recent echocardiogram, hypertension, diabetes mellitus for evaluation of recurrent chest pain. 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 Patient has been treated medically. However he has continued to have intermittent chest pain with multiple evaluations in the emergency room. Troponins are 435 and 428.   Echocardiogram this admission shows ejection fraction 60 to 65%, moderate biventricular hypertrophy, grade 2 diastolic dysfunction, mild left atrial enlargement.  Cardiac catheterization revealed 25% distal left main, 50% proximal LAD and otherwise nonobstructive coronary artery disease.    Assessment & Plan    1 chest pain-symptoms very atypical.  Cardiac catheterization as outlined above demonstrates mild coronary disease.  Plan will be medical therapy.  Continue aspirin and statin.     2 end-stage renal disease-dialysis per nephrology.   3 normocytic anemia-hemoglobin mildly decreased; per IM.   4 hypertension-continue present meds.  Patient can be discharged from a cardiac standpoint.  Continue present medications.  We will arrange follow-up with APP 2 to 4 weeks.  Follow-up Dr. Anne Fu 3 months.  For questions or updates, please contact  [facility] Please consult [email or web address] for contact info under        Signed, Olga Millers, MD  12/14/2022, 7:50 AM

## 2022-12-14 NOTE — Plan of Care (Signed)
  Problem: Health Behavior/Discharge Planning: Goal: Ability to manage health-related needs will improve Outcome: Progressing   Problem: Clinical Measurements: Goal: Will remain free from infection Outcome: Progressing Goal: Cardiovascular complication will be avoided Outcome: Progressing   Problem: Activity: Goal: Risk for activity intolerance will decrease Outcome: Progressing   Problem: Clinical Measurements: Goal: Respiratory complications will improve Outcome: Not Applicable

## 2022-12-14 NOTE — Care Management Important Message (Signed)
Important Message  Patient Details  Name: Corey Park MRN: [id number] Date of Birth: July 31, 1970   Medicare Important Message Given:  Yes     Renie Ora 12/14/2022, 8:57 AM

## 2022-12-14 NOTE — Plan of Care (Signed)
[facility] Patient Discharge Orders- [facility] CLINIC: [facility]  Patient's name: Contrell Evaristo Admit/DC Dates: 12/11/2022 - 12/14/22  Discharge Diagnoses: Chest pain s/p cardiac cath revealing nonobstructive CAD. Medical management    Acute/chronic anemia. FOBT negative  Increase ESA dosing as below  Recheck hemoglobin next HD   Aranesp: Given: Yes    Date and amount of last dose: 6/19 150 mcg  Last Hgb: 7.4 PRBC's Given: -- Date/# of units: -- ESA dose for discharge: Mircera 200 mcg IV q 2 weeks  IV Iron dose at discharge: --  Heparin change: --  EDW Change: -- New EDW:   [place]ath Change: --  Access intervention/Change: -- Details:  Hectorol/Calcitriol change: --  Discharge Labs: Calcium 8.5 Phosphorus 4.4 Albumin 2.9 K+ 4.5  IV Antibiotics: -- Details:  On Coumadin?: -- Last INR: Next INR: Managed By:   OTHER/APPTS/LAB ORDERS:    D/C Meds to be reconciled by nurse after every discharge.  Completed By: Tomasa Blase PA-C [facility] 12/14/2022,2:00 PM   Reviewed by: MD:______ RN_______

## 2022-12-14 NOTE — Telephone Encounter (Signed)
Per phase I Lemar Livings RN pt is not appropriate for cardiac rehab. Closed referral.

## 2022-12-14 NOTE — Discharge Instructions (Signed)
[name], It was a pleasure taking care of you at [facility]. You were admitted for chest pain and had a catheterization which showed some blockages but still recommending medical management. We are discharging you home now that you are doing better. Please follow the following instructions.   1) Regarding your chest pain, we think this is more related to these blockages but no need for a procedure at this time. Please continue taking Asprin 81 mg when you are discharged. We have switched you to Atorvastatin 80 mg daily for your cholesterol. Please stop taking the rosuvastatin. Follow up with your heart doctor.   2) Please follow-up with your primary care physician in 1 week for hospital follow-up.  3) Do not miss any dialysis sessions.  Please go regularly to your sessions on Monday, Wednesday, and Friday.  4) If you develop chest pain that does not resolve, with associated shortness of breath please return back to the emergency department.  Take Care, Dr. Modena Slater, D.O.

## 2022-12-14 NOTE — Progress Notes (Signed)
   12/14/22 0346  Vitals  Temp 98.2 F (36.8 C)  Temp Source Oral  BP 124/82  MAP (mmHg) 71  BP Location Left Arm  Patient Position (if appropriate) Lying  Pulse Rate 74  Pulse Rate Source Monitor  ECG Heart Rate 74  Resp (!) 21  Oxygen Therapy  SpO2 100 %  O2 Device Nasal Cannula  O2 Flow Rate (L/min) 2 L/min  Patient Activity (if Appropriate) In bed  Pulse Oximetry Type Continuous  During Treatment Monitoring  Intra-Hemodialysis Comments Tx completed  Post Treatment  Dialyzer Clearance Lightly streaked  Duration of HD Treatment -hour(s) 3 hour(s)  Hemodialysis Intake (mL) 0 mL  Liters Processed 70  Fluid Removed (mL) 2500 mL  Tolerated HD Treatment Yes  AVG/AVF Arterial Site Held (minutes) 10 minutes  AVG/AVF Venous Site Held (minutes) 10 minutes   Pt requested 3 hours dialysis only net UF . Report given to Donata Clay RN

## 2022-12-14 NOTE — Progress Notes (Signed)
Received patient in bed to unit.  Alert and oriented.  Informed consent signed and in chart. yes  TX duration:3 hours  Patient tolerated well. yes Transported back to the room yes Alert, without acute distress. yes Hand-off given to patient's nurse. Amy Sruckman  Access used: Rt fistula Access issues: none  Total UF removed: 2500 Medication(s) given: 0 Post HD VS124/82: 71 temp 98 Post HD weight: 104.3   [facility]

## 2022-12-14 NOTE — Progress Notes (Signed)
 [facility] Progress Note   Subjective: Seen in room. Cardiac cath showed non obstructive CAD. Had dialysis yesterday - no issues. Feels better today. Denies cp,dyspnea. Would like to go home -wife's birthday is tomorrow.    Objective Vitals:   12/14/22 0300 12/14/22 0346 12/14/22 0851 12/14/22 0902  BP: 139/78 124/82 131/81   Pulse: 74 74    Resp: (!) 21 (!) 21 17   Temp:  98.2 F (36.8 C)    TempSrc:  Oral    SpO2:  100%  97%  Weight:      Height:         Additional Objective Labs: Basic Metabolic Panel: Recent Labs  Lab 12/11/22 0657 12/12/22 1025 12/14/22 0030  NA 140 138 136  K 4.6 4.9 5.0  CL 101 101 92*  CO2 24 22 25   GLUCOSE 122* 101* 112*  BUN 72* 86* 67*  CREATININE 15.41* 17.02* 13.14*  CALCIUM 8.4* 9.3 8.9  PHOS 8.5* 7.2*  --     CBC: Recent Labs  Lab 12/11/22 0441 12/11/22 1126 12/12/22 1026 12/14/22 0030  WBC 7.6 7.4 7.8 6.6  NEUTROABS 4.8  --  5.8  --   HGB 8.3* 8.7* 8.3* 7.4*  HCT 25.7* 26.8* 25.1* 22.5*  MCV 97.0 97.1 94.0 94.9  PLT 168 164 157 155    Blood Culture    Component Value Date/Time   SDES BLOOD SITE NOT SPECIFIED 04/17/2022 0720   SPECREQUEST  04/17/2022 0720    BOTTLES DRAWN AEROBIC AND ANAEROBIC Blood Culture adequate volume   CULT  04/17/2022 0720    NO GROWTH 5 DAYS Performed at [facility], 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, nad, blind Heart: RRR Lungs: Clear bilaterally  Abdomen: soft non-tender Extremities: No LE edema  Dialysis Access: R AVF +bruit   Medications:  sodium chloride     anticoagulant sodium citrate      aspirin EC  81 mg Oral Daily   atorvastatin  80 mg Oral Daily   calcium acetate  2,001 mg Oral TID with meals   carvedilol  25 mg Oral BID WC   Chlorhexidine Gluconate Cloth  6 each Topical Q0600   gabapentin  100 mg Oral TID   heparin  5,000 Units Subcutaneous Q8H    irbesartan  150 mg Oral Daily   multivitamin  1 tablet Oral Daily   pantoprazole  40 mg Oral Daily   sodium chloride flush  3 mL Intravenous Q12H    Dialysis Orders:  [facility] MWF 4 hrs Edw 101 kg BFR 450 2K/2Ca F180 dialyzer AVF Heparin 6000 u bolus Mircera 120 mcg q 4 weeks, last given 11/29/22 Calcitriol 1.50 q rx  Assessment/Plan: 1 Chest pain: atypical chest pain.  Cardiology following, appreciate assistance. Cardiac catheterization revealed 25% distal left main, 50% proximal LAD and otherwise nonobstructive coronary artery disease. Medical management  2 ESRD:  MWF normally- Continue on schedule. Next HD 6/21.  3 Hypertension:  improved with UF, on home meds too 4. Anemia of ESRD: Hgb 10.5 on 11/29/22--> down to 8.3 >7.4. FOBT negative.  Tsat 34%. Received ESA 6/19 - will increase frequency q 2 wks.  5. Metabolic Bone Disease: Calcitriol 1.5 mcg q rx, binders when eating 6.  Dispo: wants to go home today   Tomasa Blase PA-C [facility] 12/14/2022,9:04 AM

## 2022-12-15 ENCOUNTER — Telehealth: Payer: Self-pay | Admitting: Nephrology

## 2022-12-15 ENCOUNTER — Telehealth: Payer: Self-pay

## 2022-12-15 LAB — LIPOPROTEIN A (LPA): Lipoprotein (a): 224.8 nmol/L — ABNORMAL HIGH (ref <75.0)

## 2022-12-15 NOTE — Telephone Encounter (Signed)
Transition of care contact from inpatient facility  Date of Discharge: 12/14/22 Date of Contact: 12/15/22 Method of contact: Phone  Attempted to contact patient to discuss transition of care from inpatient admission. Patient did not answer the phone. Will try to reach at outpatient dialysis.

## 2022-12-15 NOTE — Transitions of Care (Post Inpatient/ED Visit) (Signed)
   12/15/2022  Name: Bladyn Tipps MRN: [id number] DOB: [birth year]  Today's TOC FU Call Status: Today's TOC FU Call Status:: Unsuccessul Call (1st Attempt) Unsuccessful Call (1st Attempt) Date: 12/15/22  Attempted to reach the patient regarding the most recent Inpatient/ED visit.  Follow Up Plan: Additional outreach attempts will be made to reach the patient to complete the Transitions of Care (Post Inpatient/ED visit) call.   Jodelle Gross, RN, BSN, CCM Care Management Coordinator [place]/[facility]

## 2022-12-18 ENCOUNTER — Telehealth: Payer: Self-pay

## 2022-12-18 NOTE — Transitions of Care (Post Inpatient/ED Visit) (Signed)
   12/18/2022  Name: Corey Park MRN: [id number] DOB: Oct 11, 1970  Today's TOC FU Call Status: Today's TOC FU Call Status:: Unsuccessful Call (2nd Attempt) Unsuccessful Call (2nd Attempt) Date: 12/18/22  Attempted to reach the patient regarding the most recent Inpatient/ED visit.  Follow Up Plan: Additional outreach attempts will be made to reach the patient to complete the Transitions of Care (Post Inpatient/ED visit) call.   Jodelle Gross, RN, BSN, CCM Care Management Coordinator [place]/[facility] Phone: [phone]/Fax: [phone]

## 2022-12-19 ENCOUNTER — Telehealth: Payer: Self-pay

## 2022-12-19 NOTE — Transitions of Care (Post Inpatient/ED Visit) (Signed)
   12/19/2022  Name: Corey Park MRN: [id number] DOB: September 29, 1970  Today's TOC FU Call Status: Today's TOC FU Call Status:: Unsuccessful Call (3rd Attempt) Unsuccessful Call (3rd Attempt) Date: 12/19/22  Attempted to reach the patient regarding the most recent Inpatient/ED visit.   Follow Up Plan: No further outreach attempts will be made at this time. We have been unable to contact the patient.  Jodelle Gross, RN, BSN, CCM Care Management Coordinator /[facility] Phone: [phone]/Fax: [phone]

## 2022-12-20 ENCOUNTER — Telehealth: Payer: Self-pay | Admitting: *Deleted

## 2022-12-20 NOTE — Progress Notes (Signed)
  Care Coordination  Outreach Note  12/20/2022 Name: Corey Park MRN: [id number] DOB: 1970/09/25   Care Coordination Outreach Attempts: An unsuccessful telephone outreach was attempted today to offer the patient information about available care coordination services.  Follow Up Plan:  Additional outreach attempts will be made to offer the patient care coordination information and services.   Encounter Outcome:  No Answer  Christie Nottingham  Care Coordination Care Guide  Direct Dial: [phone]

## 2022-12-21 ENCOUNTER — Emergency Department (HOSPITAL_COMMUNITY): Payer: Medicare Other

## 2022-12-21 ENCOUNTER — Other Ambulatory Visit: Payer: Self-pay

## 2022-12-21 ENCOUNTER — Emergency Department (HOSPITAL_COMMUNITY)
Admission: EM | Admit: 2022-12-21 | Discharge: 2022-12-21 | Discharge status: Against Medical Advice | Payer: Medicare Other | Attending: Emergency Medicine | Admitting: Emergency Medicine

## 2022-12-21 ENCOUNTER — Encounter (HOSPITAL_COMMUNITY): Payer: Self-pay

## 2022-12-21 DIAGNOSIS — Z7982 Long term (current) use of aspirin: Secondary | ICD-10-CM | POA: Diagnosis not present

## 2022-12-21 DIAGNOSIS — R079 Chest pain, unspecified: Secondary | ICD-10-CM | POA: Diagnosis present

## 2022-12-21 DIAGNOSIS — I509 Heart failure, unspecified: Secondary | ICD-10-CM | POA: Insufficient documentation

## 2022-12-21 DIAGNOSIS — Z79899 Other long term (current) drug therapy: Secondary | ICD-10-CM | POA: Diagnosis not present

## 2022-12-21 DIAGNOSIS — I132 Hypertensive heart and chronic kidney disease with heart failure and with stage 5 chronic kidney disease, or end stage renal disease: Secondary | ICD-10-CM | POA: Insufficient documentation

## 2022-12-21 DIAGNOSIS — N186 End stage renal disease: Secondary | ICD-10-CM | POA: Insufficient documentation

## 2022-12-21 DIAGNOSIS — R7989 Other specified abnormal findings of blood chemistry: Secondary | ICD-10-CM | POA: Diagnosis not present

## 2022-12-21 DIAGNOSIS — E1122 Type 2 diabetes mellitus with diabetic chronic kidney disease: Secondary | ICD-10-CM | POA: Insufficient documentation

## 2022-12-21 LAB — CBC
HCT: 25.4 % — ABNORMAL LOW (ref 39.0–52.0)
Hemoglobin: 8.3 g/dL — ABNORMAL LOW (ref 13.0–17.0)
MCH: 31.4 pg (ref 26.0–34.0)
MCHC: 32.7 g/dL (ref 30.0–36.0)
MCV: 96.2 fL (ref 80.0–100.0)
Platelets: 295 10*3/uL (ref 150–400)
RBC: 2.64 MIL/uL — ABNORMAL LOW (ref 4.22–5.81)
RDW: 16.1 % — ABNORMAL HIGH (ref 11.5–15.5)
WBC: 8.2 10*3/uL (ref 4.0–10.5)
nRBC: 0 % (ref 0.0–0.2)

## 2022-12-21 LAB — BASIC METABOLIC PANEL
Anion gap: 20 — ABNORMAL HIGH (ref 5–15)
BUN: 34 mg/dL — ABNORMAL HIGH (ref 6–20)
CO2: 23 mmol/L (ref 22–32)
Calcium: 9.7 mg/dL (ref 8.9–10.3)
Chloride: 92 mmol/L — ABNORMAL LOW (ref 98–111)
Creatinine, Ser: 10.63 mg/dL — ABNORMAL HIGH (ref 0.61–1.24)
GFR, Estimated: 5 mL/min — ABNORMAL LOW (ref >60)
Glucose, Bld: 111 mg/dL — ABNORMAL HIGH (ref 70–99)
Potassium: 4.3 mmol/L (ref 3.5–5.1)
Sodium: 135 mmol/L (ref 135–145)

## 2022-12-21 LAB — TROPONIN I (HIGH SENSITIVITY)
Troponin I (High Sensitivity): 91 ng/L — ABNORMAL HIGH (ref <18)
Troponin I (High Sensitivity): 98 ng/L — ABNORMAL HIGH (ref <18)

## 2022-12-21 LAB — D-DIMER, QUANTITATIVE: D-Dimer, Quant: 2.88 ug/mL-FEU — ABNORMAL HIGH (ref 0.00–0.50)

## 2022-12-21 MED ORDER — CHLORPROMAZINE HCL 10 MG PO TABS
10.0000 mg | ORAL_TABLET | Freq: Once | ORAL | Status: AC
  Administered 2022-12-21: 10 mg via ORAL
  Filled 2022-12-21: qty 1

## 2022-12-21 MED ORDER — IOHEXOL 350 MG/ML SOLN
75.0000 mL | Freq: Once | INTRAVENOUS | Status: AC | PRN
  Administered 2022-12-21: 75 mL via INTRAVENOUS

## 2022-12-21 MED ORDER — CHLORPROMAZINE HCL 10 MG PO TABS: 10.0000 mg | ORAL_TABLET | Freq: Once | ORAL | Status: DC

## 2022-12-21 NOTE — ED Provider Notes (Signed)
[place] EMERGENCY DEPARTMENT AT [facility] Provider Note   CSN: [id number] Arrival date & time: 12/21/22  1306     History  Chief Complaint  Patient presents with   Chest Pain   HPI Corey Park is a 52 y.o. male with ESRD, diabetes, hyperlipidemia and hypertension and CHF presenting for chest pain.  Started all of a sudden around noon today.  It is in the left chest and feels like pressure.  Is nonradiating and is reproducible.  States he had acute onset of shortness of breath as well at that time.  Symptoms have been persistent.  EMS gave nitro and aspirin and patient endorsed no relief.  Was placed on oxygen for "comfort" but was neither tachypneic or hypoxic on initial vitals.  States he does have dialysis every Monday Wednesday Friday and went yesterday as he normally does.  Recent admission for NSTEMI.  Also states he has hiccups and has had them for 6 years.  HPI     Home Medications Prior to Admission medications   Medication Sig Start Date End Date Taking? Authorizing Provider  aspirin EC 81 MG tablet Take 1 tablet (81 mg total) by mouth daily. Swallow whole. 12/14/22   Modena Slater, DO  atorvastatin (LIPITOR) 80 MG tablet Take 1 tablet (80 mg total) by mouth daily. 12/15/22   Modena Slater, DO  calcium acetate (PHOSLO) 667 MG capsule Take 2 capsules (1,334 mg total) by mouth with breakfast, with lunch, and with evening meal. 12/14/22   Modena Slater, DO  carvedilol (COREG) 25 MG tablet Take 1 tablet (25 mg total) by mouth 2 (two) times daily with a meal. 12/14/22   Modena Slater, DO  diclofenac Sodium (VOLTAREN) 1 % GEL Apply 2 g topically 4 (four) times daily as needed (Pain). 10/20/22   Tiffany Kocher, DO  ferric citrate (AURYXIA) 1 GM 210 MG(Fe) tablet Take 2 tablets (420 mg total) by mouth daily. 12/14/22   Modena Slater, DO  gabapentin (NEURONTIN) 100 MG capsule Take 1 capsule (100 mg total) by mouth 3 (three) times daily. 12/14/22   Modena Slater, DO  Multiple  Vitamins-Minerals (CERTAVITE/ANTIOXIDANTS) TABS Take 1 tablet by mouth at bedtime. 10/20/22   Tiffany Kocher, DO  multivitamin (RENA-VIT) TABS tablet Take 1 tablet by mouth daily. 11/22/22   [provider]  olmesartan (BENICAR) 20 MG tablet Take 1 tablet (20 mg total) by mouth daily. 12/14/22   Modena Slater, DO  pantoprazole (PROTONIX) 40 MG tablet Take 1 tablet (40 mg total) by mouth daily. 12/14/22 01/13/23  Modena Slater, DO      Allergies    Patient has no known allergies.    Review of Systems   Review of Systems  Cardiovascular:  Positive for chest pain.    Physical Exam Updated Vital Signs BP (!) 143/83   Pulse 83   Temp 99.1 F (37.3 C) (Oral)   Resp (!) 22   Ht 6\' 1"  (1.854 m)   Wt 107 kg   SpO2 97%   BMI 31.12 kg/m  Physical Exam Vitals and nursing note reviewed.  HENT:     Head: Normocephalic and atraumatic.     Mouth/Throat:     Mouth: Mucous membranes are moist.  Eyes:     General:        Right eye: No discharge.        Left eye: No discharge.     Conjunctiva/sclera: Conjunctivae normal.  Cardiovascular:     Rate and Rhythm: Normal rate  and regular rhythm.     Pulses: Normal pulses.     Heart sounds: Normal heart sounds.  Pulmonary:     Effort: Pulmonary effort is normal.     Breath sounds: Normal breath sounds.  Chest:    Abdominal:     General: Abdomen is flat.     Palpations: Abdomen is soft.  Skin:    General: Skin is warm and dry.  Neurological:     General: No focal deficit present.  Psychiatric:        Mood and Affect: Mood normal.     ED Results / Procedures / Treatments   Labs (all labs ordered are listed, but only abnormal results are displayed) Labs Reviewed  BASIC METABOLIC PANEL - Abnormal; Notable for the following components:      Result Value   Chloride 92 (*)    Glucose, Bld 111 (*)    BUN 34 (*)    Creatinine, Ser 10.63 (*)    GFR, Estimated 5 (*)    Anion gap 20 (*)    All other components within normal limits   CBC - Abnormal; Notable for the following components:   RBC 2.64 (*)    Hemoglobin 8.3 (*)    HCT 25.4 (*)    RDW 16.1 (*)    All other components within normal limits  D-DIMER, QUANTITATIVE (NOT AT [facility]) - Abnormal; Notable for the following components:   D-Dimer, Quant 2.88 (*)    All other components within normal limits  TROPONIN I (HIGH SENSITIVITY) - Abnormal; Notable for the following components:   Troponin I (High Sensitivity) 91 (*)    All other components within normal limits  TROPONIN I (HIGH SENSITIVITY) - Abnormal; Notable for the following components:   Troponin I (High Sensitivity) 98 (*)    All other components within normal limits    EKG EKG Interpretation Date/Time:  Thursday December 21 2022 13:16:35 EDT Ventricular Rate:  88 PR Interval:  187 QRS Duration:  112 QT Interval:  405 QTC Calculation: 490 R Axis:   42  Text Interpretation: Sinus rhythm Incomplete right bundle branch block Abnormal R-wave progression, late transition ST-t wave abnormality Abnormal ECG Confirmed by Gerhard Munch [phone]) on 12/21/2022 1:21:35 PM  Radiology CT Angio Chest PE W/Cm &/Or Wo Cm  Result Date: 12/21/2022 CLINICAL DATA:  Sudden onset mid to left-sided chest pain, positive D-dimer EXAM: CT ANGIOGRAPHY CHEST WITH CONTRAST TECHNIQUE: Multidetector CT imaging of the chest was performed using the standard protocol during bolus administration of intravenous contrast. Multiplanar CT image reconstructions and MIPs were obtained to evaluate the vascular anatomy. RADIATION DOSE REDUCTION: This exam was performed according to the departmental dose-optimization program which includes automated exposure control, adjustment of the mA and/or kV according to patient size and/or use of iterative reconstruction technique. CONTRAST:  75mL OMNIPAQUE IOHEXOL 350 MG/ML SOLN COMPARISON:  10/18/2022, 12/21/2022 FINDINGS: Cardiovascular: This is a technically adequate evaluation of the pulmonary  vasculature. No filling defects or pulmonary emboli. Mild cardiomegaly unchanged. Stable atherosclerosis of the coronary vasculature and thoracic aorta. No evidence of aortic aneurysm or dissection. Mediastinum/Nodes: No enlarged mediastinal, hilar, or axillary lymph nodes. Thyroid gland, trachea, and esophagus demonstrate no significant findings. Lungs/Pleura: No acute airspace disease, effusion, or pneumothorax. Central airways are patent. Upper Abdomen: No acute abnormality. Musculoskeletal: No acute or destructive bony abnormalities. Reconstructed images demonstrate no additional findings. Review of the MIP images confirms the above findings. IMPRESSION: 1. No evidence of pulmonary embolus. 2. No acute intrathoracic  process. 3.  Aortic Atherosclerosis (ICD10-I70.0). 4. Stable cardiomegaly and coronary artery atherosclerosis. Electronically Signed   By: Sharlet Salina M.D.   On: 12/21/2022 17:38   DG Chest Port 1 View  Result Date: 12/21/2022 CLINICAL DATA:  Chest pain EXAM: PORTABLE CHEST 1 VIEW COMPARISON:  12/11/2022 FINDINGS: The heart is borderline enlarged but stable. The mediastinal and hilar contours are within normal limits and unchanged. Streaky bibasilar atelectasis. No infiltrates or effusions. No pneumothorax. The bony thorax is intact. IMPRESSION: Streaky bibasilar atelectasis. Electronically Signed   By: Rudie Meyer M.D.   On: 12/21/2022 14:35    Procedures Procedures    Medications Ordered in ED Medications  chlorproMAZINE (THORAZINE) tablet 10 mg (10 mg Oral Given 12/21/22 1731)  iohexol (OMNIPAQUE) 350 MG/ML injection 75 mL (75 mLs Intravenous Contrast Given 12/21/22 1720)    ED Course/ Medical Decision Making/ A&P                             Medical Decision Making Amount and/or Complexity of Data Reviewed Labs: ordered. Radiology: ordered.  Risk Prescription drug management.   Initial Impression and Ddx 52 year old well-appearing male presenting for chest pain.   Exam was unremarkable.  DDx includes ACS, PE, pneumothorax, pneumonia, CHF exacerbation. Patient PMH that increases complexity of ED encounter:  ESRD, diabetes, hyperlipidemia and hypertension and CHF  Interpretation of Diagnostics - I independent reviewed and interpreted the labs as followed: elevated troponin, elevated d dimer  - I independently visualized the following imaging with scope of interpretation limited to determining acute life threatening conditions related to emergency care: CTA chest revealed no acute cardiopulmonary process  -I personally reviewed and interpreted EKG which revealed sinus rhythm with incomplete RBBB  Patient Reassessment and Ultimate Disposition/Management After treatment and observation, patient stated that his chest pain is gone away. However given the rise in his troponin and his relatively high risk for ACS, thought it warranted further evaluation by cardiology.  Was going to consult cardiology but patient left AMA.   Patient management required discussion with the following services or consulting groups:  None  Complexity of Problems Addressed Acute complicated illness or Injury  Additional Data Reviewed and Analyzed Further history obtained from: Past medical history and medications listed in the EMR and Prior ED visit notes  Patient Encounter Risk Assessment Consideration of hospitalization         Final Clinical Impression(s) / ED Diagnoses Final diagnoses:  Chest pain, unspecified type    Rx / DC Orders ED Discharge Orders     None         Gareth Eagle, PA-C 12/21/22 2145    Gerhard Munch, MD 12/25/22 [phone]

## 2022-12-21 NOTE — Progress Notes (Signed)
  Care Coordination  Outreach Note  12/21/2022 Name: Marvis Saefong MRN: [id number] DOB: [birth year]   Care Coordination Outreach Attempts: A second unsuccessful outreach was attempted today to offer the patient with information about available care coordination services.  Follow Up Plan:  Additional outreach attempts will be made to offer the patient care coordination information and services.   Encounter Outcome:  No Answer  Christie Nottingham  Care Coordination Care Guide  Direct Dial: [phone]

## 2022-12-21 NOTE — ED Notes (Signed)
Pt leaving AMA, refused to sign AMA paperwork or allow DC vitals

## 2022-12-21 NOTE — ED Triage Notes (Signed)
Sudden onset of CP mid to L chest, given 324 ASA and 0.8 sublingual NTG en route. Dialysis pt

## 2022-12-22 ENCOUNTER — Emergency Department (HOSPITAL_COMMUNITY): Payer: Medicare Other

## 2022-12-22 ENCOUNTER — Other Ambulatory Visit: Payer: Self-pay

## 2022-12-22 ENCOUNTER — Emergency Department (HOSPITAL_COMMUNITY)
Admission: EM | Admit: 2022-12-22 | Discharge: 2022-12-22 | Disposition: A | Discharge status: Home / Self Care | Payer: Medicare Other | Attending: Emergency Medicine | Admitting: Emergency Medicine

## 2022-12-22 ENCOUNTER — Emergency Department (HOSPITAL_BASED_OUTPATIENT_CLINIC_OR_DEPARTMENT_OTHER): Payer: Medicare Other

## 2022-12-22 DIAGNOSIS — I2510 Atherosclerotic heart disease of native coronary artery without angina pectoris: Secondary | ICD-10-CM | POA: Insufficient documentation

## 2022-12-22 DIAGNOSIS — I724 Aneurysm of artery of lower extremity: Secondary | ICD-10-CM | POA: Diagnosis not present

## 2022-12-22 DIAGNOSIS — K4090 Unilateral inguinal hernia, without obstruction or gangrene, not specified as recurrent: Secondary | ICD-10-CM | POA: Diagnosis not present

## 2022-12-22 DIAGNOSIS — R030 Elevated blood-pressure reading, without diagnosis of hypertension: Secondary | ICD-10-CM | POA: Insufficient documentation

## 2022-12-22 DIAGNOSIS — R079 Chest pain, unspecified: Secondary | ICD-10-CM | POA: Diagnosis present

## 2022-12-22 DIAGNOSIS — S3022XA Contusion of scrotum and testes, initial encounter: Secondary | ICD-10-CM

## 2022-12-22 DIAGNOSIS — I729 Aneurysm of unspecified site: Secondary | ICD-10-CM

## 2022-12-22 DIAGNOSIS — Z7982 Long term (current) use of aspirin: Secondary | ICD-10-CM | POA: Insufficient documentation

## 2022-12-22 LAB — CBC WITH DIFFERENTIAL/PLATELET
Abs Immature Granulocytes: 0.03 10*3/uL (ref 0.00–0.07)
Basophils Absolute: 0 10*3/uL (ref 0.0–0.1)
Basophils Relative: 0 %
Eosinophils Absolute: 0.3 10*3/uL (ref 0.0–0.5)
Eosinophils Relative: 3 %
HCT: 25.8 % — ABNORMAL LOW (ref 39.0–52.0)
Hemoglobin: 8.6 g/dL — ABNORMAL LOW (ref 13.0–17.0)
Immature Granulocytes: 0 %
Lymphocytes Relative: 14 %
Lymphs Abs: 1.2 10*3/uL (ref 0.7–4.0)
MCH: 32.2 pg (ref 26.0–34.0)
MCHC: 33.3 g/dL (ref 30.0–36.0)
MCV: 96.6 fL (ref 80.0–100.0)
Monocytes Absolute: 0.9 10*3/uL (ref 0.1–1.0)
Monocytes Relative: 10 %
Neutro Abs: 6.6 10*3/uL (ref 1.7–7.7)
Neutrophils Relative %: 73 %
Platelets: 285 10*3/uL (ref 150–400)
RBC: 2.67 MIL/uL — ABNORMAL LOW (ref 4.22–5.81)
RDW: 15.9 % — ABNORMAL HIGH (ref 11.5–15.5)
WBC: 9 10*3/uL (ref 4.0–10.5)
nRBC: 0 % (ref 0.0–0.2)

## 2022-12-22 LAB — COMPREHENSIVE METABOLIC PANEL
ALT: 14 U/L (ref 0–44)
AST: 24 U/L (ref 15–41)
Albumin: 3.3 g/dL — ABNORMAL LOW (ref 3.5–5.0)
Alkaline Phosphatase: 51 U/L (ref 38–126)
Anion gap: 16 — ABNORMAL HIGH (ref 5–15)
BUN: 49 mg/dL — ABNORMAL HIGH (ref 6–20)
CO2: 24 mmol/L (ref 22–32)
Calcium: 8.9 mg/dL (ref 8.9–10.3)
Chloride: 93 mmol/L — ABNORMAL LOW (ref 98–111)
Creatinine, Ser: 12.52 mg/dL — ABNORMAL HIGH (ref 0.61–1.24)
GFR, Estimated: 4 mL/min — ABNORMAL LOW (ref >60)
Glucose, Bld: 160 mg/dL — ABNORMAL HIGH (ref 70–99)
Potassium: 4.3 mmol/L (ref 3.5–5.1)
Sodium: 133 mmol/L — ABNORMAL LOW (ref 135–145)
Total Bilirubin: 0.9 mg/dL (ref 0.3–1.2)
Total Protein: 8.1 g/dL (ref 6.5–8.1)

## 2022-12-22 LAB — TROPONIN I (HIGH SENSITIVITY)
Troponin I (High Sensitivity): 107 ng/L (ref <18)
Troponin I (High Sensitivity): 109 ng/L (ref <18)

## 2022-12-22 MED ORDER — MORPHINE SULFATE (PF) 4 MG/ML IV SOLN
4.0000 mg | Freq: Once | INTRAVENOUS | Status: AC
  Administered 2022-12-22: 4 mg via INTRAVENOUS
  Filled 2022-12-22: qty 1

## 2022-12-22 MED ORDER — ACETAMINOPHEN 325 MG PO TABS: 650.0000 mg | ORAL_TABLET | Freq: Four times a day (QID) | ORAL | 0 refills | Status: DC | PRN

## 2022-12-22 MED ORDER — FENTANYL CITRATE PF 50 MCG/ML IJ SOSY
50.0000 ug | PREFILLED_SYRINGE | Freq: Once | INTRAMUSCULAR | Status: AC
  Administered 2022-12-22: 50 ug via INTRAVENOUS
  Filled 2022-12-22: qty 1

## 2022-12-22 MED ORDER — OXYCODONE HCL 5 MG PO TABS: 5.0000 mg | ORAL_TABLET | Freq: Two times a day (BID) | ORAL | 0 refills | Status: DC | PRN

## 2022-12-22 MED ORDER — ONDANSETRON 4 MG PO TBDP: 4.0000 mg | ORAL_TABLET | Freq: Three times a day (TID) | ORAL | 0 refills | Status: DC | PRN

## 2022-12-22 MED ORDER — ONDANSETRON HCL 4 MG/2ML IJ SOLN
4.0000 mg | Freq: Once | INTRAMUSCULAR | Status: AC
  Administered 2022-12-22: 4 mg via INTRAVENOUS
  Filled 2022-12-22: qty 2

## 2022-12-22 NOTE — ED Provider Notes (Addendum)
52 yo male w/ ESRD on dialysis, presenting to ED with chest pain Admitted earlier this month for minor NSTEMI, LHC on 6/19 with moderate CAD, 50% prox LAD Admitted in [place] hospital last week for NSTEMI again  Pt received morphine in ED BP softer here  Initial trop 107 Pending repeat trop and cardiology consult  Physical Exam  BP 102/68   Pulse 72   Temp 98.7 F (37.1 C) (Oral)   Resp 16   Ht 6\' 1"  (1.854 m)   Wt 99.3 kg   SpO2 100%   BMI 28.89 kg/m   Physical Exam  Procedures  Procedures  ED Course / MDM   Clinical Course as of 12/22/22 1535  Fri Dec 22, 2022  1610 Cardiology consulted - Dr Rennis Golden [MT]  1137 Repaged Dr Rennis Golden to discuss hematoma findings. [MT]  1138 Care everywhere records from [place] reporting: 2. 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 Stable. No further intervention needed. Recommend follow up with outpatient vascular surgery in [place]. [MT]  1145 Dr Rennis Golden recommending repeat vascular ultrasound here to evaluate for evidence of new bleeding - no active bleeding noted on [facility] ultrasound with injection last week.  Hematoma size is roughly similar. [MT]  1534 Imaging reviewed with Dr Rennis Golden - no acute bleeding - pain better controlled - advised heating packs, sparing pain meds, and work note provided.  Patient content with plan.  Nephrology team has also discussed dialysis tomorrow at his regular center to make up for today. [MT]    Clinical Course User Index [MT] Jasmynn Pfalzgraf, Kermit Balo, MD   Medical Decision Making Amount and/or Complexity of Data Reviewed Labs: ordered. Radiology: ordered.  Risk OTC drugs. Prescription drug management.   Patient was evaluated by cardiology service here, who felt the patient would be stable and reasonable for discharge with outpatient follow-up.  He has a follow-up appointment scheduled in approximate 1 week with cardiology already.  His troponins were negative here.  He was encouraged to  remain compliant with all of his medications by the cardiologist.  From a dialysis perspective, based on my review of his workup, I do think he is stable to continue with his regular scheduled dialysis on Monday.  I do not believe he is requiring emergency dialysis at this time.  However the patient was complaining continued inguinal pain.  When I went to examine him this morning, he has a large, firm inguinal hernia that extends into the right scrotum.  It is tender to the touch.  I have ordered CT imaging to better evaluate this.  He has a regular BM yesterday.  He is belching today.   Terald Sleeper, MD 12/22/22 1000    Terald Sleeper, MD 12/22/22 1535

## 2022-12-22 NOTE — Discharge Instructions (Addendum)
You can report to dialysis tomorrow morning to make up for your missed dialysis today.   Important - you have a hematoma, or a collection of leaked blood, in your right upper leg and scrotum (near your testicles).  This is a complication from your heart catheterization procedure earlier this month.  The bleeding looks stable at this time in the ER.  But the blood that is causing the firmness and pain will likely take several days if not weeks to slowly reabsorb.  Recommend that you apply heating packs several times a day to this area and try to lie flat is much as possible.  I prescribed you a stronger pain medication which you should take sparingly , as this is an opioid narcotic, and can lead to drowsiness.  This is particularly dangerous in patients on dialysis because the medicine last in your blood system longer.  If at all possible, stick to regular Tylenol for pain, and use oxycodone only for severe pain once every 12 hours.

## 2022-12-22 NOTE — Progress Notes (Signed)
Lower extremity pseudoaneurysm check study completed.  Preliminary results relayed to ED provider.   See CV Proc for preliminary results report.   Jean Rosenthal, RDMS, RVT

## 2022-12-22 NOTE — ED Notes (Signed)
Corey Park from lab called critical Troponin 107. Will notify provider.

## 2022-12-22 NOTE — ED Triage Notes (Signed)
Pt arrives to ED c/o Cp/SOB x week. Pt with cath last week. Pt endorses SOB x several months Pt is dialysis pt and compliant with treatments.

## 2022-12-22 NOTE — ED Provider Notes (Signed)
[place] EMERGENCY DEPARTMENT AT [facility] Provider Note   CSN: [id number] Arrival date & time: 12/22/22  0416     History  Chief Complaint  Patient presents with   Chest Pain    Corey Park is a 52 y.o. male.  Patient presents to the emergency department for evaluation of chest pain.  Patient has a history of moderate but nonobstructive CAD seen on heart cath 12/13/2022.  He reports that he was admitted to the hospital in [place] for a heart attack several days after that.  He has been having chest pain continuously since then.       Home Medications Prior to Admission medications   Medication Sig Start Date End Date Taking? Authorizing Provider  aspirin EC 81 MG tablet Take 1 tablet (81 mg total) by mouth daily. Swallow whole. 12/14/22   Modena Slater, DO  atorvastatin (LIPITOR) 80 MG tablet Take 1 tablet (80 mg total) by mouth daily. 12/15/22   Modena Slater, DO  calcium acetate (PHOSLO) 667 MG capsule Take 2 capsules (1,334 mg total) by mouth with breakfast, with lunch, and with evening meal. 12/14/22   Modena Slater, DO  carvedilol (COREG) 25 MG tablet Take 1 tablet (25 mg total) by mouth 2 (two) times daily with a meal. 12/14/22   Modena Slater, DO  diclofenac Sodium (VOLTAREN) 1 % GEL Apply 2 g topically 4 (four) times daily as needed (Pain). 10/20/22   Tiffany Kocher, DO  ferric citrate (AURYXIA) 1 GM 210 MG(Fe) tablet Take 2 tablets (420 mg total) by mouth daily. 12/14/22   Modena Slater, DO  gabapentin (NEURONTIN) 100 MG capsule Take 1 capsule (100 mg total) by mouth 3 (three) times daily. 12/14/22   Modena Slater, DO  Multiple Vitamins-Minerals (CERTAVITE/ANTIOXIDANTS) TABS Take 1 tablet by mouth at bedtime. 10/20/22   Tiffany Kocher, DO  multivitamin (RENA-VIT) TABS tablet Take 1 tablet by mouth daily. 11/22/22   [provider]  olmesartan (BENICAR) 20 MG tablet Take 1 tablet (20 mg total) by mouth daily. 12/14/22   Modena Slater, DO  pantoprazole  (PROTONIX) 40 MG tablet Take 1 tablet (40 mg total) by mouth daily. 12/14/22 01/13/23  Modena Slater, DO      Allergies    Patient has no known allergies.    Review of Systems   Review of Systems  Physical Exam Updated Vital Signs BP 131/74   Pulse 87   Temp 98.7 F (37.1 C) (Oral)   Resp 16   Ht 6\' 1"  (1.854 m)   Wt 99.3 kg   SpO2 100%   BMI 28.89 kg/m  Physical Exam Vitals and nursing note reviewed.  Constitutional:      General: He is not in acute distress.    Appearance: He is well-developed.  HENT:     Head: Normocephalic and atraumatic.     Mouth/Throat:     Mouth: Mucous membranes are moist.  Eyes:     General: Vision grossly intact. Gaze aligned appropriately.     Extraocular Movements: Extraocular movements intact.     Conjunctiva/sclera: Conjunctivae normal.  Cardiovascular:     Rate and Rhythm: Normal rate and regular rhythm.     Pulses: Normal pulses.     Heart sounds: Normal heart sounds, S1 normal and S2 normal. No murmur heard.    No friction rub. No gallop.  Pulmonary:     Effort: Pulmonary effort is normal. No respiratory distress.     Breath sounds: Normal breath sounds.  Abdominal:     Palpations: Abdomen is soft.     Tenderness: There is no abdominal tenderness. There is no guarding or rebound.     Hernia: No hernia is present.  Musculoskeletal:        General: No swelling.     Cervical back: Full passive range of motion without pain, normal range of motion and neck supple. No pain with movement, spinous process tenderness or muscular tenderness. Normal range of motion.     Right lower leg: No edema.     Left lower leg: No edema.  Skin:    General: Skin is warm and dry.     Capillary Refill: Capillary refill takes less than 2 seconds.     Findings: No ecchymosis, erythema, lesion or wound.  Neurological:     Mental Status: He is alert and oriented to person, place, and time.     GCS: GCS eye subscore is 4. GCS verbal subscore is 5. GCS motor  subscore is 6.     Cranial Nerves: Cranial nerves 2-12 are intact.     Sensory: Sensation is intact.     Motor: Motor function is intact. No weakness or abnormal muscle tone.     Coordination: Coordination is intact.  Psychiatric:        Mood and Affect: Mood normal.        Speech: Speech normal.        Behavior: Behavior normal.     ED Results / Procedures / Treatments   Labs (all labs ordered are listed, but only abnormal results are displayed) Labs Reviewed  CBC WITH DIFFERENTIAL/PLATELET - Abnormal; Notable for the following components:      Result Value   RBC 2.67 (*)    Hemoglobin 8.6 (*)    HCT 25.8 (*)    RDW 15.9 (*)    All other components within normal limits  COMPREHENSIVE METABOLIC PANEL - Abnormal; Notable for the following components:   Sodium 133 (*)    Chloride 93 (*)    Glucose, Bld 160 (*)    BUN 49 (*)    Creatinine, Ser 12.52 (*)    Albumin 3.3 (*)    GFR, Estimated 4 (*)    Anion gap 16 (*)    All other components within normal limits  TROPONIN I (HIGH SENSITIVITY) - Abnormal; Notable for the following components:   Troponin I (High Sensitivity) 107 (*)    All other components within normal limits  TROPONIN I (HIGH SENSITIVITY)    EKG EKG Interpretation Date/Time:  Friday December 22 2022 04:39:50 EDT Ventricular Rate:  83 PR Interval:  186 QRS Duration:  120 QT Interval:  417 QTC Calculation: 490 R Axis:   108  Text Interpretation: Sinus rhythm Consider left ventricular hypertrophy Repol abnrm suggests ischemia, diffuse leads ST elevation, consider anterolateral injury Confirmed by Gilda Crease [phone]) on 12/22/2022 6:11:36 AM  Radiology DG Chest Port 1 View  Result Date: 12/22/2022 CLINICAL DATA:  52 year old male with history of chest pain. EXAM: PORTABLE CHEST 1 VIEW COMPARISON:  Chest x-ray 12/21/2022. FINDINGS: Lung volumes are low. A definite consolidative airspace disease. No pleural effusions. No pneumothorax. No evidence of  pulmonary edema. Heart size appears mildly enlarged. The patient is rotated to the right on today's exam, resulting in distortion of the mediastinal contours and reduced diagnostic sensitivity and specificity for mediastinal pathology. Atherosclerotic calcifications are noted in the thoracic aorta. IMPRESSION: 1. Low lung volumes without radiographic evidence of acute cardiopulmonary disease.  2. Mild cardiomegaly. Electronically Signed   By: Trudie Reed M.D.   On: 12/22/2022 05:01   CT Angio Chest PE W/Cm &/Or Wo Cm  Result Date: 12/21/2022 CLINICAL DATA:  Sudden onset mid to left-sided chest pain, positive D-dimer EXAM: CT ANGIOGRAPHY CHEST WITH CONTRAST TECHNIQUE: Multidetector CT imaging of the chest was performed using the standard protocol during bolus administration of intravenous contrast. Multiplanar CT image reconstructions and MIPs were obtained to evaluate the vascular anatomy. RADIATION DOSE REDUCTION: This exam was performed according to the departmental dose-optimization program which includes automated exposure control, adjustment of the mA and/or kV according to patient size and/or use of iterative reconstruction technique. CONTRAST:  75mL OMNIPAQUE IOHEXOL 350 MG/ML SOLN COMPARISON:  10/18/2022, 12/21/2022 FINDINGS: Cardiovascular: This is a technically adequate evaluation of the pulmonary vasculature. No filling defects or pulmonary emboli. Mild cardiomegaly unchanged. Stable atherosclerosis of the coronary vasculature and thoracic aorta. No evidence of aortic aneurysm or dissection. Mediastinum/Nodes: No enlarged mediastinal, hilar, or axillary lymph nodes. Thyroid gland, trachea, and esophagus demonstrate no significant findings. Lungs/Pleura: No acute airspace disease, effusion, or pneumothorax. Central airways are patent. Upper Abdomen: No acute abnormality. Musculoskeletal: No acute or destructive bony abnormalities. Reconstructed images demonstrate no additional findings. Review of  the MIP images confirms the above findings. IMPRESSION: 1. No evidence of pulmonary embolus. 2. No acute intrathoracic process. 3.  Aortic Atherosclerosis (ICD10-I70.0). 4. Stable cardiomegaly and coronary artery atherosclerosis. Electronically Signed   By: Sharlet Salina M.D.   On: 12/21/2022 17:38   DG Chest Port 1 View  Result Date: 12/21/2022 CLINICAL DATA:  Chest pain EXAM: PORTABLE CHEST 1 VIEW COMPARISON:  12/11/2022 FINDINGS: The heart is borderline enlarged but stable. The mediastinal and hilar contours are within normal limits and unchanged. Streaky bibasilar atelectasis. No infiltrates or effusions. No pneumothorax. The bony thorax is intact. IMPRESSION: Streaky bibasilar atelectasis. Electronically Signed   By: Rudie Meyer M.D.   On: 12/21/2022 14:35    Procedures Procedures    Medications Ordered in ED Medications  ondansetron (ZOFRAN) injection 4 mg (4 mg Intravenous Given 12/22/22 0436)  morphine (PF) 4 MG/ML injection 4 mg (4 mg Intravenous Given 12/22/22 0435)    ED Course/ Medical Decision Making/ A&P                             Medical Decision Making Amount and/or Complexity of Data Reviewed External Data Reviewed: labs, radiology, ECG and notes.    Details: Recent  hospitalization at [facility] reviewed Labs: ordered. Decision-making details documented in ED Course. Radiology: ordered.  Risk Prescription drug management.   Differential Diagnosis considered includes, but not limited to: STEMI; NSTEMI; myocarditis; pericarditis; pulmonary embolism; aortic dissection; pneumothorax; pneumonia; gastritis; musculoskeletal pain  Patient presents to the emergency department for evaluation of persistent chest pain.  Patient has a complicated history.  I did review his records.  Patient underwent heart catheterization here at [facility] on June 19 after he was admitted on June 17 for NSTEMI.  Heart cath findings as follows:    Dist LM to Prox LAD lesion is  25% stenosed.  Cross-sectional area of the left main by intravascular ultrasound was 8.1 mm.   Prox LAD lesion is 50% stenosed.  Cross-sectional area of this lesion by intravascular ultrasound was 6.2 mm.   Mid LAD lesion is 25% stenosed.   Ost Cx to Prox Cx lesion is 25% stenosed.   The left ventricular  systolic function is normal.   LV end diastolic pressure is mildly elevated.   The left ventricular ejection fraction is 55-65% by visual estimate.   There is no aortic valve stenosis.   In the absence of any other complications or medical issues, we expect the patient to be ready for discharge from a cath perspective on 12/14/2022.   Recommend Aspirin 81mg  daily for moderate CAD.   Severe coronary calcification.  Nonobstructive disease throughout the left main, LAD and circumflex.  Continue medical therapy.   There were no interventions at that time.  Patient then apparently went to [place] and was admitted there for NSTEMI on June 22.  Records of this were reviewed, no additional intervention or heart catheterization at that time.  Cardiology recommended continued medical management.  Patient seen in this ED yesterday for continued chest pain.  Admission was recommended but the patient left AMA.  Review of that visit reveals that his troponin was 98 followed by 91.  He underwent CT angiography at that time that did not show any evidence of PE.  Patient now returning with persistent pain.  First troponin 107.  Plan will be to obtain second troponin for trend and then consult cardiology for further instructions.  Will sign out to oncoming ER physician to obtain second troponin.       Final Clinical Impression(s) / ED Diagnoses Final diagnoses:  Chest pain, unspecified type    Rx / DC Orders ED Discharge Orders     None         Gilda Crease, MD 12/22/22 [phone]

## 2022-12-22 NOTE — Consult Note (Addendum)
CONSULTATION NOTE   Patient Name: Corey Park Date of Encounter: 12/22/2022 Cardiologist: Donato Schultz, MD Electrophysiologist: None Advanced Heart Failure: None   Chief Complaint   Chest pain  Patient Profile   52 yo male with ESRD and recurrent chest pain, thought to be atypical, s/p recent LHC on 12/13/2022 with moderate CAD, returns with chest pain  HPI   Corey Park is a 52 y.o. male who is being seen today for the evaluation of chest pain at the request of Dr. Renaye Rakers. This is a 52 yo male with ESRD on HD (M,W,F), HTN, DM2 and known CAD, who presented with recurrent chest pain. He has had multiple ER presentations for similar symptoms and was recently admitted for chest pain and flat eleavated troponin in the 400's. Cardiology was consulted and he ultimately underwent LHC on 12/13/2022.  This demonstrated mild to moderate CAD with 25% distal left main to proximal LAD, 50% proximal LAD, 25% ostial circumflex stenosis, but otherwise no significant stenosis.  LVEF was normal.  Echo also showed normal LVEF 60 to 65% with moderate LVH and grade 2 diastolic dysfunction.  Left atrium was mildly dilated.  Of note he had a Myoview stress test in February 2024 which showed apical to basal inferior fixed defect consistent with scar and reduced LVEF 42%, but this does not fit with his recent echo findings showing normal wall motion and no coronary obstruction.  At discharge medical therapy was recommended.  He now returns today for recurrent chest pressure.  It is described as nonradiating and is reproducible.  There was acute onset shortness of breath as well.  He reports he has had hiccups on and off for the past 6 years.  Troponin here is flat at 91, 98, 107 and 109, much lower than the mid 400s which were noted a little over a week ago.  Apparently, the patient was at [facility] and was admitted at [facility] for NSTEMI on June 22.  They had recommended starting isosorbide at  discharge.  He was not able to get that medication filled and just returned back to [facility] yesterday before he started having worsening symptoms.  His other main complaint is right scrotal pain.  Apparently this was ultrasounded in [place] but he "did not trust them". It was noted that they saw a pseudoaneurysm at the recent femoral cath site, but it was successfully closed with a thrombin injection. Hemoglobin here has been stable.  He also is complaining of ongoing hiccups and left chest wall pain.  PMHx   Past Medical History:  Diagnosis Date   Acute CHF (congestive heart failure) (HCC) 07/27/2019   AKI (acute kidney injury) (HCC) 12/11/2016   Allergy, unspecified, initial encounter 08/25/2019   Anemia 2021   Anesthesia of skin 03/31/2020   Asthma    as a child   Blind    Cellulitis, perineum 11/26/2019   CHF (congestive heart failure) (HCC) 2021   Chronic kidney disease, stage V (HCC) 06/17/2018   Chronic kidney disease, stage V (HCC) 06/17/2018   Complication of vascular dialysis catheter 08/25/2019   COVID-19 virus infection 07/27/2019   Diabetes mellitus without complication (HCC)    type 2   Dilated cardiomyopathy (HCC) 07/03/2018   Elevated troponin    Encounter for removal of sutures 08/04/2020   Fe deficiency anemia 12/23/2018   Fluid overload 12/11/2016   Hypertension    Hypertensive urgency 12/11/2016   Hypomagnesemia 12/13/2016   Legally blind    B/L  Pain, unspecified 10/17/2019   Pneumonia 2021   Renal disorder    dialysis T-TH-Sat   Shortness of breath 11/10/2019   Symptomatic anemia 12/21/2018   Type 2 diabetes mellitus with diabetic peripheral angiopathy without gangrene (HCC) 08/25/2019   Unspecified protein-calorie malnutrition (HCC) 08/25/2019    Past Surgical History:  Procedure Laterality Date   BASCILIC VEIN TRANSPOSITION Right 10/16/2019   Procedure: Basilic Vein Transposition Right Arm;  Surgeon: Nada Libman, MD;  Location: [name] OR;  Service: Vascular;   Laterality: Right;   BASCILIC VEIN TRANSPOSITION Right 01/29/2020   Procedure: RIGHT ARM SECOND STAGE BASCILIC VEIN TRANSPOSITION;  Surgeon: Nada Libman, MD;  Location: [facility] OR;  Service: Vascular;  Laterality: Right;   BIOPSY  12/22/2018   Procedure: BIOPSY;  Surgeon: Carman Ching, MD;  Location: [facility] ENDOSCOPY;  Service: Endoscopy;;   CORONARY ULTRASOUND/IVUS N/A 12/13/2022   Procedure: Coronary Ultrasound/IVUS;  Surgeon: Corky Crafts, MD;  Location: [facility] INVASIVE CV LAB;  Service: Cardiovascular;  Laterality: N/A;   ESOPHAGOGASTRODUODENOSCOPY N/A 12/22/2018   Procedure: ESOPHAGOGASTRODUODENOSCOPY (EGD);  Surgeon: Carman Ching, MD;  Location: [facility] ENDOSCOPY;  Service: Endoscopy;  Laterality: N/A;   IR FLUORO GUIDE CV LINE RIGHT  07/29/2019   IR US GUIDE VASC ACCESS RIGHT  07/29/2019   LEFT HEART CATH AND CORONARY ANGIOGRAPHY N/A 12/13/2022   Procedure: LEFT HEART CATH AND CORONARY ANGIOGRAPHY;  Surgeon: Corky Crafts, MD;  Location: [facility] INVASIVE CV LAB;  Service: Cardiovascular;  Laterality: N/A;    FAMHx   Family History  Problem Relation Age of Onset   CAD Mother    Hypertension Mother    Diabetes Neg Hx    Stroke Neg Hx    Cancer Neg Hx    Kidney failure Neg Hx    Stomach cancer Neg Hx    Colon cancer Neg Hx    Rectal cancer Neg Hx     SOCHx    reports that he has never smoked. He has never used smokeless tobacco. He reports that he does not currently use alcohol. He reports that he does not currently use drugs after having used the following drugs: Marijuana.  Outpatient Medications   No current facility-administered medications on file prior to encounter.   Current Outpatient Medications on File Prior to Encounter  Medication Sig Dispense Refill   aspirin EC 81 MG tablet Take 1 tablet (81 mg total) by mouth daily. Swallow whole. 120 tablet 0   atorvastatin (LIPITOR) 80 MG tablet Take 1 tablet (80 mg total) by mouth daily. 30 tablet 0   calcium acetate (PHOSLO)  667 MG capsule Take 2 capsules (1,334 mg total) by mouth with breakfast, with lunch, and with evening meal. 180 capsule 0   carvedilol (COREG) 25 MG tablet Take 1 tablet (25 mg total) by mouth 2 (two) times daily with a meal. 60 tablet 0   diclofenac Sodium (VOLTAREN) 1 % GEL Apply 2 g topically 4 (four) times daily as needed (Pain). 350 g 0   ferric citrate (AURYXIA) 1 GM 210 MG(Fe) tablet Take 2 tablets (420 mg total) by mouth daily. 60 tablet 0   gabapentin (NEURONTIN) 100 MG capsule Take 1 capsule (100 mg total) by mouth 3 (three) times daily. 90 capsule 0   Multiple Vitamins-Minerals (CERTAVITE/ANTIOXIDANTS) TABS Take 1 tablet by mouth at bedtime. 30 tablet 0   multivitamin (RENA-VIT) TABS tablet Take 1 tablet by mouth daily.     olmesartan (BENICAR) 20 MG tablet Take 1 tablet (20 mg total)  by mouth daily. 30 tablet 0   pantoprazole (PROTONIX) 40 MG tablet Take 1 tablet (40 mg total) by mouth daily. 30 tablet 0    Inpatient Medications    Scheduled Meds:   Continuous Infusions:   PRN Meds:    ALLERGIES   No Known Allergies  ROS   Pertinent items noted in HPI and remainder of comprehensive ROS otherwise negative.  Vitals   Vitals:   12/22/22 0424 12/22/22 0430 12/22/22 0645 12/22/22 0700  BP: 131/72 131/74 102/68   Pulse: 85 87 72   Resp: (!) 28 16    Temp: 98.7 F (37.1 C)   98.7 F (37.1 C)  TempSrc: Oral   Oral  SpO2: 100% 100% 100%   Weight: 99.3 kg     Height: 6\' 1"  (1.854 m)      No intake or output data in the 24 hours ending 12/22/22 0941 Filed Weights   12/22/22 0424  Weight: 99.3 kg    Physical Exam   General appearance: alert, no distress, and lying supine with his eyes closed Neck: no carotid bruit, no JVD, and thyroid not enlarged, symmetric, no tenderness/mass/nodules Lungs: clear to auscultation bilaterally and regular hiccups noted Heart: regular rate and rhythm, S1, S2 normal, no murmur, click, rub or gallop Abdomen: soft, non-tender;  bowel sounds normal; no masses,  no organomegaly Extremities: extremities normal, atraumatic, no cyanosis or edema Pulses: 2+ and symmetric Skin: Skin color, texture, turgor normal. No rashes or lesions Neurologic: Grossly normal Psych: Somewhat flat affect  Labs   Results for orders placed or performed during the hospital encounter of 12/22/22 (from the past 48 hour(s))  CBC with Differential/Platelet     Status: Abnormal   Collection Time: 12/22/22  4:23 AM  Result Value Ref Range   WBC 9.0 4.0 - 10.5 K/uL   RBC 2.67 (L) 4.22 - 5.81 MIL/uL   Hemoglobin 8.6 (L) 13.0 - 17.0 g/dL   HCT 24.4 (L) 01.0 - 27.2 %   MCV 96.6 80.0 - 100.0 fL   MCH 32.2 26.0 - 34.0 pg   MCHC 33.3 30.0 - 36.0 g/dL   RDW 53.6 (H) 64.4 - 03.4 %   Platelets 285 150 - 400 K/uL   nRBC 0.0 0.0 - 0.2 %   Neutrophils Relative % 73 %   Neutro Abs 6.6 1.7 - 7.7 K/uL   Lymphocytes Relative 14 %   Lymphs Abs 1.2 0.7 - 4.0 K/uL   Monocytes Relative 10 %   Monocytes Absolute 0.9 0.1 - 1.0 K/uL   Eosinophils Relative 3 %   Eosinophils Absolute 0.3 0.0 - 0.5 K/uL   Basophils Relative 0 %   Basophils Absolute 0.0 0.0 - 0.1 K/uL   Immature Granulocytes 0 %   Abs Immature Granulocytes 0.03 0.00 - 0.07 K/uL    Comment: Performed at [facility], [address] [place], [place] [address]  Comprehensive metabolic panel     Status: Abnormal   Collection Time: 12/22/22  4:23 AM  Result Value Ref Range   Sodium 133 (L) 135 - 145 mmol/L   Potassium 4.3 3.5 - 5.1 mmol/L   Chloride 93 (L) 98 - 111 mmol/L   CO2 24 22 - 32 mmol/L   Glucose, Bld 160 (H) 70 - 99 mg/dL    Comment: Glucose reference range applies only to samples taken after fasting for at least 8 hours.   BUN 49 (H) 6 - 20 mg/dL   Creatinine, Ser 56.38 (H)  0.61 - 1.24 mg/dL   Calcium 8.9 8.9 - 16.1 mg/dL   Total Protein 8.1 6.5 - 8.1 g/dL   Albumin 3.3 (L) 3.5 - 5.0 g/dL   AST 24 15 - 41 U/L   ALT 14 0 - 44 U/L   Alkaline Phosphatase 51 38 - 126 U/L    Total Bilirubin 0.9 0.3 - 1.2 mg/dL   GFR, Estimated 4 (L) >60 mL/min    Comment: (NOTE) Calculated using the CKD-EPI Creatinine Equation (2021)    Anion gap 16 (H) 5 - 15    Comment: Performed at [facility], [address] [place], [place] [address]  Troponin I (High Sensitivity)     Status: Abnormal   Collection Time: 12/22/22  4:23 AM  Result Value Ref Range   Troponin I (High Sensitivity) 107 (HH) <18 ng/L    Comment: CRITICAL RESULT CALLED TO, READ BACK BY AND VERIFIED WITH MEDVEDEV,S RN 12/22/22 0554 AMIREHSANI F (NOTE) Elevated high sensitivity troponin I (hsTnI) values and significant  changes across serial measurements may suggest ACS but many other  chronic and acute conditions are known to elevate hsTnI results.  Refer to the "Links" section for chest pain algorithms and additional  guidance. Performed at [facility], [address], [place], [place] [address]   Troponin I (High Sensitivity)     Status: Abnormal   Collection Time: 12/22/22  6:34 AM  Result Value Ref Range   Troponin I (High Sensitivity) 109 (HH) <18 ng/L    Comment: CRITICAL VALUE NOTED. VALUE IS CONSISTENT WITH PREVIOUSLY REPORTED/CALLED VALUE (NOTE) Elevated high sensitivity troponin I (hsTnI) values and significant  changes across serial measurements may suggest ACS but many other  chronic and acute conditions are known to elevate hsTnI results.  Refer to the "Links" section for chest pain algorithms and additional  guidance. Performed at [facility], [address], [place], [place] [address]     ECG   Sinus rhythm, LVH with repolarization abnormality (unchanged compared to prior EKG on 12/14/2022)- Personally Reviewed  Telemetry   Sinus rhythm- Personally Reviewed  Radiology   DG Chest [place] 1 View  Result Date: 12/22/2022 CLINICAL DATA:  52 year old male with history of chest pain. EXAM: PORTABLE CHEST 1 VIEW COMPARISON:  Chest x-ray 12/21/2022. FINDINGS: Lung  volumes are low. A definite consolidative airspace disease. No pleural effusions. No pneumothorax. No evidence of pulmonary edema. Heart size appears mildly enlarged. The patient is rotated to the right on today's exam, resulting in distortion of the mediastinal contours and reduced diagnostic sensitivity and specificity for mediastinal pathology. Atherosclerotic calcifications are noted in the thoracic aorta. IMPRESSION: 1. Low lung volumes without radiographic evidence of acute cardiopulmonary disease. 2. Mild cardiomegaly. Electronically Signed   By: Trudie Reed M.D.   On: 12/22/2022 05:01   CT Angio Chest PE W/Cm &/Or Wo Cm  Result Date: 12/21/2022 CLINICAL DATA:  Sudden onset mid to left-sided chest pain, positive D-dimer EXAM: CT ANGIOGRAPHY CHEST WITH CONTRAST TECHNIQUE: Multidetector CT imaging of the chest was performed using the standard protocol during bolus administration of intravenous contrast. Multiplanar CT image reconstructions and MIPs were obtained to evaluate the vascular anatomy. RADIATION DOSE REDUCTION: This exam was performed according to the departmental dose-optimization program which includes automated exposure control, adjustment of the mA and/or kV according to patient size and/or use of iterative reconstruction technique. CONTRAST:  75mL OMNIPAQUE IOHEXOL 350 MG/ML SOLN COMPARISON:  10/18/2022, 12/21/2022 FINDINGS: Cardiovascular: This is a  technically adequate evaluation of the pulmonary vasculature. No filling defects or pulmonary emboli. Mild cardiomegaly unchanged. Stable atherosclerosis of the coronary vasculature and thoracic aorta. No evidence of aortic aneurysm or dissection. Mediastinum/Nodes: No enlarged mediastinal, hilar, or axillary lymph nodes. Thyroid gland, trachea, and esophagus demonstrate no significant findings. Lungs/Pleura: No acute airspace disease, effusion, or pneumothorax. Central airways are patent. Upper Abdomen: No acute abnormality.  Musculoskeletal: No acute or destructive bony abnormalities. Reconstructed images demonstrate no additional findings. Review of the MIP images confirms the above findings. IMPRESSION: 1. No evidence of pulmonary embolus. 2. No acute intrathoracic process. 3.  Aortic Atherosclerosis (ICD10-I70.0). 4. Stable cardiomegaly and coronary artery atherosclerosis. Electronically Signed   By: Sharlet Salina M.D.   On: 12/21/2022 17:38   DG Chest [place] 1 View  Result Date: 12/21/2022 CLINICAL DATA:  Chest pain EXAM: PORTABLE CHEST 1 VIEW COMPARISON:  12/11/2022 FINDINGS: The heart is borderline enlarged but stable. The mediastinal and hilar contours are within normal limits and unchanged. Streaky bibasilar atelectasis. No infiltrates or effusions. No pneumothorax. The bony thorax is intact. IMPRESSION: Streaky bibasilar atelectasis. Electronically Signed   By: Rudie Meyer M.D.   On: 12/21/2022 14:35    Cardiac Studies   N/A  Impression   Principal Problem:   Nonspecific chest pain   Recommendation   Mr. Bea is describing a nonspecific chest pain which I feel is more likely a chest wall pain or may be related to his chronic hiccups.  He had an extensive recent workup including cardiac catheterization several weeks ago which showed at worst moderate coronary disease.  He was then seen at [facility] and was recommended to start on isosorbide at discharge.   He never filled this medicine. I think it is good for him to try to get that medication prescription filled.  He may need a new prescription here in [place] at discharge.  No further invasive workup is recommended.  He has a litany of other concerns including right scrotal pain which was evaluated in [place] as well.  He was found to have a pseudoaneurysm at the cath site which resolved with thrombin injection. Hemoglobin has been stable. He also reports issues with nausea and having to induce vomiting with his toothbrush to "Breath better" and  other concerns.  No further cardiac workup is recommended at this time for chest pain.  He can follow-up with cardiology including his primary cardiologist Dr. Anne Fu or APP in the future.  Thanks for the consultation.  Cardiology will sign off.  Time Spent Directly with Patient:  I have spent a total of 45 minutes with the patient reviewing hospital notes, telemetry, EKGs, labs and examining the patient as well as establishing an assessment and plan that was discussed personally with the patient.  > 50% of time was spent in direct patient care.  Length of Stay:  LOS: 0 days   Chrystie Nose, MD, [facility], FACP  [place]  [facility]  Medical Director of the Advanced Lipid Disorders &  Cardiovascular Risk Reduction Clinic Diplomate of the American Board of Clinical Lipidology Attending Cardiologist  Direct Dial: [phone]  Fax: [phone]  Website:  [email or web address] 12/22/2022, 9:41 AM

## 2022-12-22 NOTE — Progress Notes (Signed)
Contacted by nephrologist with request for assistance with pt's out-pt HD options for today/tomorrow. Contacted [facility] and spoke to Consulting civil engineer. Pt's regular HD appt is MWF 3rd shift (4:00 arrival for 4:20 chair time). Pt's appt still available this morning for this afternoon. Team advised by ED provider that pt may not be able to make appt this afternoon. Contacted [facility] and spoke to charge RN again. Clinic can treat pt tomorrow if pt d/c later today/evening. Pt will need to arrive at 7:30 am for 7:50 am chair time. This information was provided to ED provider and nephrologist. Added appt to pt's AVS as well.   Olivia Canter Renal Navigator [phone]

## 2022-12-22 NOTE — Progress Notes (Addendum)
   Called by Dr. Renaye Rakers regarding ongoing right groin pain. Reviewed records from [facility] and noted he had a right femoral pseudoaneurysm. This was apparently closed with thrombin injection. Of note, he had right femoral access for his cardiac catheterization on 12/13/2022 which was closed with manual pressure. A repeat CT scan was performed here showing a right groin hematoma with measurements that are consistent with the ultrasound findings there. His pain may be related to hematoma resorption, but I advised a repeat doppler to r/o recurrent pseudoaneurysm - if there is evidence for recurrence, would advise vascular surgery evaluation as he may need surgical closure.  Chrystie Nose, MD, [facility], FACP  [place]  [facility]  Medical Director of the Advanced Lipid Disorders &  Cardiovascular Risk Reduction Clinic Diplomate of the American Board of Clinical Lipidology Attending Cardiologist  Direct Dial: [phone]  Fax: [phone]  Website:  [email or web address]

## 2022-12-25 NOTE — Progress Notes (Signed)
  Care Coordination   Note   12/25/2022 Name: Corey Park MRN: [id number] DOB: 1970/08/02  Corey Park is a 52 y.o. year old male who sees [name], NP for primary care. I reached out to [employer] by phone today to offer care coordination services.  Corey Park was given information about Care Coordination services today including:   The Care Coordination services include support from the care team which includes your Nurse Coordinator, Clinical Social Worker, or Pharmacist.  The Care Coordination team is here to help remove barriers to the health concerns and goals most important to you. Care Coordination services are voluntary, and the patient may decline or stop services at any time by request to their care team member.   Care Coordination Consent Status: Patient agreed to services and verbal consent obtained.   Follow up plan:  Telephone appointment with care coordination team member scheduled for:  12/26/22  Encounter Outcome:  Pt. Scheduled  [facility] Coordination Care Guide  Direct Dial: [phone]

## 2022-12-26 ENCOUNTER — Telehealth: Payer: Self-pay

## 2022-12-26 NOTE — Patient Outreach (Signed)
  Care Coordination   12/26/2022 Name: Corey Park MRN: [id number] DOB: [birth year]   Care Coordination Outreach Attempts:  An unsuccessful telephone outreach was attempted today to offer the patient information about available care coordination services.  Follow Up Plan:  Additional outreach attempts will be made to offer the patient care coordination information and services.   Encounter Outcome:  No Answer   Care Coordination Interventions:  No, not indicated     Juanell Fairly RN, BSN, [facility] Care Coordinator [facility]   Phone: [phone]

## 2022-12-28 NOTE — Progress Notes (Deleted)
Office Visit    Patient Name: Corey Park Date of Encounter: 12/28/2022  Primary Care Provider:  [name], NP Primary Cardiologist:  Donato Schultz, MD Primary Electrophysiologist: None   Past Medical History    Past Medical History:  Diagnosis Date   Acute CHF (congestive heart failure) (HCC) 07/27/2019   AKI (acute kidney injury) (HCC) 12/11/2016   Allergy, unspecified, initial encounter 08/25/2019   Anemia 2021   Anesthesia of skin 03/31/2020   Asthma    as a child   Blind    Cellulitis, perineum 11/26/2019   CHF (congestive heart failure) (HCC) 2021   Chronic kidney disease, stage V (HCC) 06/17/2018   Chronic kidney disease, stage V (HCC) 06/17/2018   Complication of vascular dialysis catheter 08/25/2019   COVID-19 virus infection 07/27/2019   Diabetes mellitus without complication (HCC)    type 2   Dilated cardiomyopathy (HCC) 07/03/2018   Elevated troponin    Encounter for removal of sutures 08/04/2020   Fe deficiency anemia 12/23/2018   Fluid overload 12/11/2016   Hypertension    Hypertensive urgency 12/11/2016   Hypomagnesemia 12/13/2016   Legally blind    B/L   Pain, unspecified 10/17/2019   Pneumonia 2021   Renal disorder    dialysis T-TH-Sat   Shortness of breath 11/10/2019   Symptomatic anemia 12/21/2018   Type 2 diabetes mellitus with diabetic peripheral angiopathy without gangrene (HCC) 08/25/2019   Unspecified protein-calorie malnutrition (HCC) 08/25/2019   Past Surgical History:  Procedure Laterality Date   BASCILIC VEIN TRANSPOSITION Right 10/16/2019   Procedure: Basilic Vein Transposition Right Arm;  Surgeon: Nada Libman, MD;  Location: [facility] OR;  Service: Vascular;  Laterality: Right;   BASCILIC VEIN TRANSPOSITION Right 01/29/2020   Procedure: RIGHT ARM SECOND STAGE BASCILIC VEIN TRANSPOSITION;  Surgeon: Nada Libman, MD;  Location: [facility] OR;  Service: Vascular;  Laterality: Right;   BIOPSY  12/22/2018   Procedure: BIOPSY;  Surgeon: Carman Ching, MD;   Location: [facility] ENDOSCOPY;  Service: Endoscopy;;   CORONARY ULTRASOUND/IVUS N/A 12/13/2022   Procedure: Coronary Ultrasound/IVUS;  Surgeon: Corky Crafts, MD;  Location: [facility] INVASIVE CV LAB;  Service: Cardiovascular;  Laterality: N/A;   ESOPHAGOGASTRODUODENOSCOPY N/A 12/22/2018   Procedure: ESOPHAGOGASTRODUODENOSCOPY (EGD);  Surgeon: Carman Ching, MD;  Location: [facility] ENDOSCOPY;  Service: Endoscopy;  Laterality: N/A;   IR FLUORO GUIDE CV LINE RIGHT  07/29/2019   IR US GUIDE VASC ACCESS RIGHT  07/29/2019   LEFT HEART CATH AND CORONARY ANGIOGRAPHY N/A 12/13/2022   Procedure: LEFT HEART CATH AND CORONARY ANGIOGRAPHY;  Surgeon: Corky Crafts, MD;  Location: [facility] INVASIVE CV LAB;  Service: Cardiovascular;  Laterality: N/A;    Allergies  No Known Allergies   History of Present Illness    Corey Park  is a 52 year old male with a PMH of CAD s/p LHC 12/13/2022 with mild to moderate CAD, HTN, HLD, DM type II, ESRD on HD (M,W,F), chronic systolic CHF, anemia of chronic disease, blindness, medication noncompliance, dilated cardiomyopathy who presents today for posthospital follow-up.  Mr. Diantonio was seen initially in 2018 by Dr. Anne Fu during hospitalization for hypertensive crisis.  2D echo was completed at that time showing EF of 55 to 60% with no RWMA.  He was hospitalized 05/2018 and found to have CHF exacerbation and 2D echo completed showed reduced EF of 25-30% and patient was started on carvedilol, hydralazine and Imdur.  He unfortunately progressed to ESRD and had fistular placed during hospitalization in 2021  and is currently undergoing HD Monday, Wednesday, Friday.  He completed a Myoview stress test in 07/2022 that showed apical to basal inferior fixed defect consistent with scar.  He has continued to have multiple hospital admissions for volume related concerns and was seen most recently 12/11/2022 for complaint of chest pain.  He underwent LHC which showed severe coronary calcifications but  no obstructive disease. Patient reports recent admission ED admission at [facility] while at [facility] for complaint of chest pain. Medical management was recommended at that time.  He was seen on 12/21/2022 at [facility] for ongoing chest pain with right scrotal pain.  He was started on high intensity statin and will require with LFTs and lipids in August.  He was found to have pseudoaneurysm at right femoral groin site that was treated with thrombin injection.  Since last being seen in the office patient reports***.  Patient denies chest pain, palpitations, dyspnea, PND, orthopnea, nausea, vomiting, dizziness, syncope, edema, weight gain, or early satiety.     ***Notes: -Check right groin if recurrence may need referral to vascular surgery -Recent admit for NSTEMI while in [place] -Patient needs LFTs and lipids completed in August Home Medications    Current Outpatient Medications  Medication Sig Dispense Refill   acetaminophen (TYLENOL) 325 MG tablet Take 2 tablets (650 mg total) by mouth every 6 (six) hours as needed for up to 30 doses for moderate pain or mild pain. 30 tablet 0   aspirin EC 81 MG tablet Take 1 tablet (81 mg total) by mouth daily. Swallow whole. 120 tablet 0   atorvastatin (LIPITOR) 80 MG tablet Take 1 tablet (80 mg total) by mouth daily. 30 tablet 0   calcium acetate (PHOSLO) 667 MG capsule Take 2 capsules (1,334 mg total) by mouth with breakfast, with lunch, and with evening meal. 180 capsule 0   carvedilol (COREG) 25 MG tablet Take 1 tablet (25 mg total) by mouth 2 (two) times daily with a meal. 60 tablet 0   diclofenac Sodium (VOLTAREN) 1 % GEL Apply 2 g topically 4 (four) times daily as needed (Pain). 350 g 0   ferric citrate (AURYXIA) 1 GM 210 MG(Fe) tablet Take 2 tablets (420 mg total) by mouth daily. 60 tablet 0   gabapentin (NEURONTIN) 100 MG capsule Take 1 capsule (100 mg total) by mouth 3 (three) times daily. 90 capsule 0   Multiple Vitamins-Minerals  (CERTAVITE/ANTIOXIDANTS) TABS Take 1 tablet by mouth at bedtime. 30 tablet 0   multivitamin (RENA-VIT) TABS tablet Take 1 tablet by mouth daily.     olmesartan (BENICAR) 20 MG tablet Take 1 tablet (20 mg total) by mouth daily. 30 tablet 0   ondansetron (ZOFRAN-ODT) 4 MG disintegrating tablet Take 1 tablet (4 mg total) by mouth every 8 (eight) hours as needed for up to 12 doses for nausea or vomiting. 12 tablet 0   oxyCODONE (ROXICODONE) 5 MG immediate release tablet Take 1 tablet (5 mg total) by mouth every 12 (twelve) hours as needed for up to 12 doses for severe pain. 12 tablet 0   pantoprazole (PROTONIX) 40 MG tablet Take 1 tablet (40 mg total) by mouth daily. 30 tablet 0   No current facility-administered medications for this visit.     Review of Systems  Please see the history of present illness.    (+)*** (+)***  All other systems reviewed and are otherwise negative except as noted above.  Physical Exam    Wt Readings from Last 3 Encounters:  12/22/22 219 lb (99.3 kg)  12/21/22 235 lb 14.3 oz (107 kg)  12/12/22 235 lb 7.2 oz (106.8 kg)   WU:JWJXB were no vitals filed for this visit.,There is no height or weight on file to calculate BMI.  Constitutional:      Appearance: Healthy appearance. Not in distress.  Neck:     Vascular: JVD normal.  Pulmonary:     Effort: Pulmonary effort is normal.     Breath sounds: No wheezing. No rales. Diminished in the bases Cardiovascular:     Normal rate. Regular rhythm. Normal S1. Normal S2.      Murmurs: There is no murmur.  Edema:    Peripheral edema absent.  Abdominal:     Palpations: Abdomen is soft non tender. There is no hepatomegaly.  Skin:    General: Skin is warm and dry.  Neurological:     General: No focal deficit present.     Mental Status: Alert and oriented to person, place and time.     Cranial Nerves: Cranial nerves are intact.  EKG/LABS/ Recent Cardiac Studies    ECG personally reviewed by me today - ***   Risk  Assessment/Calculations:   {Does this patient have ATRIAL FIBRILLATION?:[phone]}        Lab Results  Component Value Date   WBC 9.0 12/22/2022   HGB 8.6 (L) 12/22/2022   HCT 25.8 (L) 12/22/2022   MCV 96.6 12/22/2022   PLT 285 12/22/2022   Lab Results  Component Value Date   CREATININE 12.52 (H) 12/22/2022   BUN 49 (H) 12/22/2022   NA 133 (L) 12/22/2022   K 4.3 12/22/2022   CL 93 (L) 12/22/2022   CO2 24 12/22/2022   Lab Results  Component Value Date   ALT 14 12/22/2022   AST 24 12/22/2022   ALKPHOS 51 12/22/2022   BILITOT 0.9 12/22/2022   Lab Results  Component Value Date   CHOL 160 12/11/2022   HDL 31 (L) 12/11/2022   LDLCALC 107 (H) 12/11/2022   TRIG 111 12/11/2022   CHOLHDL 5.2 12/11/2022    Lab Results  Component Value Date   HGBA1C 5.6 09/12/2022     Assessment & Plan    1.  Coronary artery disease: - LHC on 12/13/2022.  This demonstrated mild to moderate CAD and recent NSTEMI in [place] on 11/2020 and was started on Imdur -Patient seen most recently on 6/27 with complaint of chest pain and right groin pain and found to have pseudoaneurysm -Today patient reports***   2. Right groin pseudoaneurysm: -Patient had complaint of right scrotal pain   3.  Essential hypertension -Patient's blood pressure today was***   4.  ESRD: -Patient currently dialyzes Monday, Wednesday, Friday  5.  Hyperlipidemia: -Patient's LDL cholesterol was***  6.  Chronic systolic CHF: -Patient's 2D echo showed LVEF 60 to 65% with moderate LVH and grade 2 diastolic dysfunction with mildly dilated LA      Disposition: Follow-up with Donato Schultz, MD or APP in *** months {Are you ordering a CV Procedure (e.g. stress test, cath, DCCV, TEE, etc)?   Press F2        :[id number]}   Medication Adjustments/Labs and Tests Ordered: Current medicines are reviewed at length with the patient today.  Concerns regarding medicines are outlined above.   Signed, Napoleon Form, Leodis Rains, NP 12/28/2022, 11:09 AM [facility]

## 2022-12-29 ENCOUNTER — Ambulatory Visit: Payer: Medicare Other | Attending: Nurse Practitioner | Admitting: Nurse Practitioner

## 2022-12-29 DIAGNOSIS — I10 Essential (primary) hypertension: Secondary | ICD-10-CM

## 2022-12-29 DIAGNOSIS — E785 Hyperlipidemia, unspecified: Secondary | ICD-10-CM

## 2022-12-29 DIAGNOSIS — N186 End stage renal disease: Secondary | ICD-10-CM

## 2022-12-29 DIAGNOSIS — I2510 Atherosclerotic heart disease of native coronary artery without angina pectoris: Secondary | ICD-10-CM

## 2022-12-29 DIAGNOSIS — E1122 Type 2 diabetes mellitus with diabetic chronic kidney disease: Secondary | ICD-10-CM

## 2022-12-29 DIAGNOSIS — I5022 Chronic systolic (congestive) heart failure: Secondary | ICD-10-CM

## 2023-01-03 ENCOUNTER — Other Ambulatory Visit: Payer: Self-pay

## 2023-01-03 ENCOUNTER — Emergency Department (HOSPITAL_BASED_OUTPATIENT_CLINIC_OR_DEPARTMENT_OTHER): Payer: Medicare Other

## 2023-01-03 ENCOUNTER — Emergency Department (HOSPITAL_COMMUNITY)
Admission: EM | Admit: 2023-01-03 | Discharge: 2023-01-04 | Disposition: A | Discharge status: Home / Self Care | Payer: Medicare Other | Attending: Emergency Medicine | Admitting: Emergency Medicine

## 2023-01-03 ENCOUNTER — Encounter (HOSPITAL_COMMUNITY): Payer: Self-pay

## 2023-01-03 ENCOUNTER — Emergency Department (HOSPITAL_COMMUNITY): Payer: Medicare Other

## 2023-01-03 DIAGNOSIS — Z79899 Other long term (current) drug therapy: Secondary | ICD-10-CM | POA: Diagnosis not present

## 2023-01-03 DIAGNOSIS — I132 Hypertensive heart and chronic kidney disease with heart failure and with stage 5 chronic kidney disease, or end stage renal disease: Secondary | ICD-10-CM | POA: Diagnosis not present

## 2023-01-03 DIAGNOSIS — J9601 Acute respiratory failure with hypoxia: Secondary | ICD-10-CM | POA: Insufficient documentation

## 2023-01-03 DIAGNOSIS — S301XXA Contusion of abdominal wall, initial encounter: Secondary | ICD-10-CM

## 2023-01-03 DIAGNOSIS — N186 End stage renal disease: Secondary | ICD-10-CM | POA: Diagnosis not present

## 2023-01-03 DIAGNOSIS — E1151 Type 2 diabetes mellitus with diabetic peripheral angiopathy without gangrene: Secondary | ICD-10-CM | POA: Diagnosis not present

## 2023-01-03 DIAGNOSIS — R0789 Other chest pain: Secondary | ICD-10-CM | POA: Diagnosis present

## 2023-01-03 DIAGNOSIS — E1122 Type 2 diabetes mellitus with diabetic chronic kidney disease: Secondary | ICD-10-CM | POA: Diagnosis not present

## 2023-01-03 DIAGNOSIS — J45909 Unspecified asthma, uncomplicated: Secondary | ICD-10-CM | POA: Diagnosis not present

## 2023-01-03 DIAGNOSIS — I5022 Chronic systolic (congestive) heart failure: Secondary | ICD-10-CM | POA: Diagnosis not present

## 2023-01-03 DIAGNOSIS — I509 Heart failure, unspecified: Secondary | ICD-10-CM | POA: Insufficient documentation

## 2023-01-03 DIAGNOSIS — Z1152 Encounter for screening for COVID-19: Secondary | ICD-10-CM | POA: Diagnosis not present

## 2023-01-03 DIAGNOSIS — Z7982 Long term (current) use of aspirin: Secondary | ICD-10-CM | POA: Diagnosis not present

## 2023-01-03 DIAGNOSIS — R0602 Shortness of breath: Secondary | ICD-10-CM | POA: Insufficient documentation

## 2023-01-03 DIAGNOSIS — Z8616 Personal history of COVID-19: Secondary | ICD-10-CM | POA: Diagnosis not present

## 2023-01-03 DIAGNOSIS — D500 Iron deficiency anemia secondary to blood loss (chronic): Secondary | ICD-10-CM | POA: Diagnosis not present

## 2023-01-03 LAB — BRAIN NATRIURETIC PEPTIDE: B Natriuretic Peptide: 299.2 pg/mL — ABNORMAL HIGH (ref 0.0–100.0)

## 2023-01-03 LAB — CBC
HCT: 21 % — ABNORMAL LOW (ref 39.0–52.0)
Hemoglobin: 6.7 g/dL — CL (ref 13.0–17.0)
MCH: 31.2 pg (ref 26.0–34.0)
MCHC: 31.9 g/dL (ref 30.0–36.0)
MCV: 97.7 fL (ref 80.0–100.0)
Platelets: 210 10*3/uL (ref 150–400)
RBC: 2.15 MIL/uL — ABNORMAL LOW (ref 4.22–5.81)
RDW: 15.4 % (ref 11.5–15.5)
WBC: 6.5 10*3/uL (ref 4.0–10.5)
nRBC: 0 % (ref 0.0–0.2)

## 2023-01-03 LAB — BPAM RBC
Blood Product Expiration Date: 202408062359
Unit Type and Rh: 5100

## 2023-01-03 LAB — TYPE AND SCREEN: ABO/RH(D): O POS

## 2023-01-03 LAB — BASIC METABOLIC PANEL
Anion gap: 18 — ABNORMAL HIGH (ref 5–15)
BUN: 46 mg/dL — ABNORMAL HIGH (ref 6–20)
CO2: 27 mmol/L (ref 22–32)
Calcium: 8.8 mg/dL — ABNORMAL LOW (ref 8.9–10.3)
Chloride: 95 mmol/L — ABNORMAL LOW (ref 98–111)
Creatinine, Ser: 13.39 mg/dL — ABNORMAL HIGH (ref 0.61–1.24)
GFR, Estimated: 4 mL/min — ABNORMAL LOW (ref >60)
Glucose, Bld: 119 mg/dL — ABNORMAL HIGH (ref 70–99)
Potassium: 3.9 mmol/L (ref 3.5–5.1)
Sodium: 140 mmol/L (ref 135–145)

## 2023-01-03 LAB — RESP PANEL BY RT-PCR (RSV, FLU A&B, COVID)  RVPGX2
Influenza A by PCR: NEGATIVE
Influenza B by PCR: NEGATIVE
Resp Syncytial Virus by PCR: NEGATIVE
SARS Coronavirus 2 by RT PCR: NEGATIVE

## 2023-01-03 LAB — TROPONIN I (HIGH SENSITIVITY)
Troponin I (High Sensitivity): 121 ng/L (ref <18)
Troponin I (High Sensitivity): 144 ng/L (ref <18)

## 2023-01-03 LAB — POC OCCULT BLOOD, ED: Fecal Occult Bld: NEGATIVE

## 2023-01-03 LAB — MAGNESIUM: Magnesium: 1.8 mg/dL (ref 1.7–2.4)

## 2023-01-03 LAB — PREPARE RBC (CROSSMATCH)

## 2023-01-03 MED ORDER — DARBEPOETIN ALFA 100 MCG/0.5ML IJ SOSY
100.0000 ug | PREFILLED_SYRINGE | INTRAMUSCULAR | Status: DC
  Filled 2023-01-03: qty 0.5

## 2023-01-03 MED ORDER — SENNOSIDES-DOCUSATE SODIUM 8.6-50 MG PO TABS: 1.0000 | ORAL_TABLET | Freq: Every evening | ORAL | Status: DC | PRN

## 2023-01-03 MED ORDER — FERRIC CITRATE 1 GM 210 MG(FE) PO TABS
420.0000 mg | ORAL_TABLET | Freq: Three times a day (TID) | ORAL | Status: DC
  Administered 2023-01-03 – 2023-01-04 (×3): 420 mg via ORAL
  Filled 2023-01-03 (×3): qty 2

## 2023-01-03 MED ORDER — ACETAMINOPHEN 650 MG RE SUPP: 650.0000 mg | Freq: Four times a day (QID) | RECTAL | Status: DC | PRN

## 2023-01-03 MED ORDER — NITROGLYCERIN 0.4 MG SL SUBL
0.4000 mg | SUBLINGUAL_TABLET | SUBLINGUAL | Status: DC | PRN
  Administered 2023-01-03: 0.4 mg via SUBLINGUAL
  Filled 2023-01-03: qty 1

## 2023-01-03 MED ORDER — HEPARIN SODIUM (PORCINE) 5000 UNIT/ML IJ SOLN
5000.0000 [IU] | Freq: Three times a day (TID) | INTRAMUSCULAR | Status: DC
  Filled 2023-01-03: qty 1

## 2023-01-03 MED ORDER — ACETAMINOPHEN 325 MG PO TABS
650.0000 mg | ORAL_TABLET | Freq: Four times a day (QID) | ORAL | Status: DC | PRN
  Filled 2023-01-03: qty 2

## 2023-01-03 MED ORDER — SODIUM CHLORIDE 0.9% IV SOLUTION: Freq: Once | INTRAVENOUS | Status: AC

## 2023-01-03 MED ORDER — SODIUM CHLORIDE 0.9% FLUSH: 3.0000 mL | Freq: Once | INTRAVENOUS | Status: DC

## 2023-01-03 MED ORDER — CALCITRIOL 0.5 MCG PO CAPS
1.5000 ug | ORAL_CAPSULE | ORAL | Status: DC
  Filled 2023-01-03: qty 3

## 2023-01-03 MED ORDER — CHLORHEXIDINE GLUCONATE CLOTH 2 % EX PADS: 6.0000 | MEDICATED_PAD | Freq: Every day | CUTANEOUS | Status: DC

## 2023-01-03 MED ORDER — DARBEPOETIN ALFA 200 MCG/0.4ML IJ SOSY: 200.0000 ug | PREFILLED_SYRINGE | INTRAMUSCULAR | Status: DC

## 2023-01-03 NOTE — ED Notes (Signed)
Nephrology at bedside

## 2023-01-03 NOTE — ED Provider Notes (Signed)
[facility]GENCY DEPARTMENT AT [facility] Provider Note   CSN: [id number] Arrival date & time: 01/03/23  6578     History  Chief Complaint  Patient presents with   Chest Pain    Corey Park is a 52 y.o. male.  52 y.o. male with ESRD, diabetes, hyperlipidemia and hypertension and CHF presenting for chest pain.  Chest pain started earlier in the night.  Associated with shortness of breath.  Symptoms worse when patient attempts to lie down.  Describes the pain as a dull pressure-like pain.  Does not radiate.  Not associated with lightheadedness, diaphoresis, or palpitations.  States he gets similar symptoms every week.  He is a dialysis patient on Monday, Wednesday, Friday schedule.  Reports compliance with dialysis sessions.  Most recent session on Monday.  Had a full session.  Symptoms are worse with exertion particularly with the shortness of breath.  The history is provided by the patient. No language interpreter was used.       Home Medications Prior to Admission medications   Medication Sig Start Date End Date Taking? Authorizing Provider  acetaminophen (TYLENOL) 325 MG tablet Take 2 tablets (650 mg total) by mouth every 6 (six) hours as needed for up to 30 doses for moderate pain or mild pain. 12/22/22   Terald Sleeper, MD  aspirin EC 81 MG tablet Take 1 tablet (81 mg total) by mouth daily. Swallow whole. 12/14/22   Modena Slater, DO  atorvastatin (LIPITOR) 80 MG tablet Take 1 tablet (80 mg total) by mouth daily. 12/15/22   Modena Slater, DO  calcium acetate (PHOSLO) 667 MG capsule Take 2 capsules (1,334 mg total) by mouth with breakfast, with lunch, and with evening meal. 12/14/22   Modena Slater, DO  carvedilol (COREG) 25 MG tablet Take 1 tablet (25 mg total) by mouth 2 (two) times daily with a meal. 12/14/22   Modena Slater, DO  diclofenac Sodium (VOLTAREN) 1 % GEL Apply 2 g topically 4 (four) times daily as needed (Pain). 10/20/22   Tiffany Kocher, DO  ferric citrate  (AURYXIA) 1 GM 210 MG(Fe) tablet Take 2 tablets (420 mg total) by mouth daily. 12/14/22   Modena Slater, DO  gabapentin (NEURONTIN) 100 MG capsule Take 1 capsule (100 mg total) by mouth 3 (three) times daily. 12/14/22   Modena Slater, DO  Multiple Vitamins-Minerals (CERTAVITE/ANTIOXIDANTS) TABS Take 1 tablet by mouth at bedtime. 10/20/22   Tiffany Kocher, DO  multivitamin (RENA-VIT) TABS tablet Take 1 tablet by mouth daily. 11/22/22   [provider]  olmesartan (BENICAR) 20 MG tablet Take 1 tablet (20 mg total) by mouth daily. 12/14/22   Modena Slater, DO  ondansetron (ZOFRAN-ODT) 4 MG disintegrating tablet Take 1 tablet (4 mg total) by mouth every 8 (eight) hours as needed for up to 12 doses for nausea or vomiting. 12/22/22   Trifan, Kermit Balo, MD  oxyCODONE (ROXICODONE) 5 MG immediate release tablet Take 1 tablet (5 mg total) by mouth every 12 (twelve) hours as needed for up to 12 doses for severe pain. 12/22/22   Terald Sleeper, MD  pantoprazole (PROTONIX) 40 MG tablet Take 1 tablet (40 mg total) by mouth daily. 12/14/22 01/13/23  Modena Slater, DO      Allergies    Patient has no known allergies.    Review of Systems   Review of Systems  Constitutional:  Negative for chills, diaphoresis and fever.  Respiratory:  Positive for shortness of breath.   Cardiovascular:  Positive  for chest pain.  Gastrointestinal:  Negative for abdominal pain, nausea and vomiting.  Neurological:  Negative for light-headedness.  All other systems reviewed and are negative.   Physical Exam Updated Vital Signs BP 131/86 (BP Location: Left Arm)   Pulse 73   Resp 14   Ht 6\' 1"  (1.854 m)   Wt 99.9 kg   SpO2 99%   BMI 29.06 kg/m  Physical Exam Vitals and nursing note reviewed.  Constitutional:      General: He is not in acute distress.    Appearance: Normal appearance. He is not ill-appearing.  HENT:     Head: Normocephalic and atraumatic.     Nose: Nose normal.  Eyes:     General: No scleral icterus.     Extraocular Movements: Extraocular movements intact.     Conjunctiva/sclera: Conjunctivae normal.  Cardiovascular:     Rate and Rhythm: Normal rate and regular rhythm.     Heart sounds: Normal heart sounds.  Pulmonary:     Effort: Pulmonary effort is normal. No respiratory distress.     Breath sounds: Normal breath sounds. No wheezing or rales.  Abdominal:     General: There is no distension.     Tenderness: There is no abdominal tenderness.  Musculoskeletal:        General: Normal range of motion.     Cervical back: Normal range of motion.     Right lower leg: No edema.     Left lower leg: No edema.  Skin:    General: Skin is warm and dry.  Neurological:     General: No focal deficit present.     Mental Status: He is alert. Mental status is at baseline.     ED Results / Procedures / Treatments   Labs (all labs ordered are listed, but only abnormal results are displayed) Labs Reviewed  RESP PANEL BY RT-PCR (RSV, FLU A&B, COVID)  RVPGX2  BASIC METABOLIC PANEL  CBC  BRAIN NATRIURETIC PEPTIDE  MAGNESIUM  TROPONIN I (HIGH SENSITIVITY)    EKG None  Radiology No results found.  Procedures Procedures    Medications Ordered in ED Medications  sodium chloride flush (NS) 0.9 % injection 3 mL (has no administration in time range)  nitroGLYCERIN (NITROSTAT) SL tablet 0.4 mg (has no administration in time range)    ED Course/ Medical Decision Making/ A&P                             Medical Decision Making Amount and/or Complexity of Data Reviewed Labs: ordered. Radiology: ordered.  Risk Prescription drug management.   Medical Decision Making / ED Course   This patient presents to the ED for concern of chest pain, this involves an extensive number of treatment options, and is a complaint that carries with it a high risk of complications and morbidity.  The differential diagnosis includes ACS, PE, pneumonia, volume overload, MSK etiology, pericarditis,  dissection  MDM: 52 year old male with past medical history as above presents today for concern of chest pain, shortness of breath.  Symptoms worse when lying down.  No other overt signs of volume overload on exam.  Reports compliance with his dialysis sessions.  Is without acute distress on exam.  Pain does not radiate.  Without the troponins as they have not resulted patient's heart score falls in the moderate range.  EKG shows ST changes however these are similar to prior EKG from 6/28.  Obtain ACS  workup including BNP and magnesium.  Will obtain respiratory panel.   Patient admitted my shift is awaiting labs.  Chest x-ray resulted.  Shows some signs of vascular congestion.  Patient signed out to oncoming provider for follow-up on labs and reevaluation.   Lab Tests: -I ordered, reviewed, and interpreted labs.   The pertinent results include:   Labs Reviewed  RESP PANEL BY RT-PCR (RSV, FLU A&B, COVID)  RVPGX2  BASIC METABOLIC PANEL  CBC  BRAIN NATRIURETIC PEPTIDE  MAGNESIUM  TROPONIN I (HIGH SENSITIVITY)      EKG  EKG Interpretation Date/Time:    Ventricular Rate:    PR Interval:    QRS Duration:    QT Interval:    QTC Calculation:   R Axis:      Text Interpretation:           Imaging Studies ordered: I ordered imaging studies including chest x-ray I independently visualized and interpreted imaging. I agree with the radiologist interpretation   Medicines ordered and prescription drug management: Meds ordered this encounter  Medications   sodium chloride flush (NS) 0.9 % injection 3 mL   nitroGLYCERIN (NITROSTAT) SL tablet 0.4 mg    -I have reviewed the patients home medicines and have made adjustments as needed   Reevaluation: After the interventions noted above, I reevaluated the patient and found that they have :stayed the same  Co morbidities that complicate the patient evaluation  Past Medical History:  Diagnosis Date   Acute CHF (congestive heart  failure) (HCC) 07/27/2019   AKI (acute kidney injury) (HCC) 12/11/2016   Allergy, unspecified, initial encounter 08/25/2019   Anemia 2021   Anesthesia of skin 03/31/2020   Asthma    as a child   Blind    Cellulitis, perineum 11/26/2019   CHF (congestive heart failure) (HCC) 2021   Chronic kidney disease, stage V (HCC) 06/17/2018   Chronic kidney disease, stage V (HCC) 06/17/2018   Complication of vascular dialysis catheter 08/25/2019   COVID-19 virus infection 07/27/2019   Diabetes mellitus without complication (HCC)    type 2   Dilated cardiomyopathy (HCC) 07/03/2018   Elevated troponin    Encounter for removal of sutures 08/04/2020   Fe deficiency anemia 12/23/2018   Fluid overload 12/11/2016   Hypertension    Hypertensive urgency 12/11/2016   Hypomagnesemia 12/13/2016   Legally blind    B/L   Pain, unspecified 10/17/2019   Pneumonia 2021   Renal disorder    dialysis T-TH-Sat   Shortness of breath 11/10/2019   Symptomatic anemia 12/21/2018   Type 2 diabetes mellitus with diabetic peripheral angiopathy without gangrene (HCC) 08/25/2019   Unspecified protein-calorie malnutrition (HCC) 08/25/2019      Dispostion: Patient signed out to oncoming provider for reevaluation and follow-up on labs.   Final Clinical Impression(s) / ED Diagnoses Final diagnoses:  Atypical chest pain    Rx / DC Orders ED Discharge Orders     None         Marita Kansas, PA-C 01/03/23 0636    Tilden Fossa, MD 01/03/23 [phone]

## 2023-01-03 NOTE — ED Notes (Signed)
Pt noted to desat to 70s, 80s while sleeping; placed on 2L [place]; pt alert to voice, NAD

## 2023-01-03 NOTE — ED Notes (Signed)
Pt transported to dialysis

## 2023-01-03 NOTE — ED Provider Notes (Signed)
  Accepted handoff at shift change from [facility]. Please see prior provider note for more detail.   Briefly: Patient is 52 y.o. "presenting for chest pain. Chest pain started earlier in the night. Associated with shortness of breath. Symptoms worse when patient attempts to lie down. Describes the pain as a dull pressure-like pain. Does not radiate. Not associated with lightheadedness, diaphoresis, or palpitations. States he gets similar symptoms every week. He is a dialysis patient on Monday, Wednesday, Friday schedule. Reports compliance with dialysis sessions. Most recent session on Monday. Had a full session. Symptoms are worse with exertion particularly with the shortness of breath. "  DDX: concern for  ACS, PE, pneumonia, volume overload, MSK etiology, pericarditis, dissection   Plan:   - Admitting patient for anemia requiring transfusion (6.7) and dialysis - Patient with PMHx ESRD, diabetes, hyperlipidemia and hypertension and CHF presenting for chest pain, SOB, and orthopnea since last night. Patient with similar symptoms weekly. Has been evaluated for chest pain recently by cardiology. M/W/F dialysis schedule. - physical exam not concerning for fluid overload. BNP 299. Troponin chronically elevated - 121 and 144 today. Chest xray with concern for interstitial edema BL. BMP with elevated BUN/Cr (46/13.4). BMP also with chloride 95 - otherwise without electrolyte abnormality. Resp panel negative.  - CBC showing HGB 6.7 (drop from 8.6 12 days ago).  Patient denying hematochezia, hematemesis. Patient with known right femoral pseudoaneurysm dx 11/2022 in which he states has gotten longer. US obtained to r/u bleeding from pseudoaneurysm which was not concerning for acute bleed at this time. Hemoccult negative.  - given patient's anemia and SOB with interstitial edema on xray, I believe patient would benefit from inpatient treatment with blood transfusion and dialysis today. - Consulted with Dr.  Arlean Hopping from renal who agrees to dialysis patient.  - Dr. Welton Flakes admitting provider.         Dorthy Cooler, [place] 01/03/23 1310    Tilden Fossa, MD 01/03/23 2308

## 2023-01-03 NOTE — ED Notes (Signed)
Pt at this time refusing any more meds until pt gets some food. Provider did let pt know a renal diet order would be placed. Pt was given a drink a sandwich in the ed. Pt refused the sandwich and threw it on the floor. Pt reports, "I wants some real food or I'm not taking any more meds."

## 2023-01-03 NOTE — ED Notes (Signed)
Pt resting with eyes closed; respirations spontaneous, even, unlabored 

## 2023-01-03 NOTE — Hospital Course (Signed)
SOB, CP ST changes Anemia: 6.7, was 8.6 Desat 70s-80s 2L [place] sleeping Trops 121-->144 BNP: 299  #Anemia Iron studies Repeat CBC NORMOcytic   Chest Pain   SOB   Femoral pseudoaneurysm   Colonoscopy  Any blood?  Dialysis: 3.5L   O2 at home  Pain better?

## 2023-01-03 NOTE — Procedures (Signed)
I was present at this dialysis session, have reviewed the session and made  appropriate changes Vinson Moselle MD  [facility] 01/03/2023, 2:32 PM

## 2023-01-03 NOTE — ED Notes (Signed)
EDP notified of elevated trop 

## 2023-01-03 NOTE — H&P (Cosign Needed)
Date: 01/03/2023               Patient Name:  Corey Park MRN: [id number]  DOB: 16-Oct-1970 Age / Sex: 52 y.o., male   PCP: [name], NP         Medical Service: Internal Medicine Teaching Service         Attending Physician: : Dr. Criselda Peaches    First Contact: Faith Rogue, DO      Pager: [phone]      Second Contact: Morene Crocker, MD      Pager: [facility] [phone]           After Hours (After 5p/  First Contact Pager: [phone]  weekends / holidays): Second Contact Pager: [phone]   SUBJECTIVE   Chief Complaint: Chest pain  History of Present Illness: Corey Park is a 52 year old male with past medical history of recovered systolic heart failure hypertension, type 2 diabetes, ESRD on dialysis Monday Wednesday and Friday, blind, who presented to the emergency department today after having chest pain and shortness of breath.  Patient reports that his 10/10 chest pain and shortness of breath started last evening while sitting down and that his chest pain does not feel like his previous chest pain whenever he was admitted for ACS in the past.  He tried taking carvedilol last night to relieve his chest pain and that was unsuccessful denies using nitroglycerin at home to help relieve the pain.  His pain does not radiate and he was able to point exactly to the location of the pain that seems to worsen with deep inhalation.   He is unsure of the last time he was admitted for a heart failure exacerbation.  He denies missing doses of medications at home even with the loss of vision. Although he denied an increase in salty foods/ increase in fluid intake, he was requesting salty foods to eat here and reported that those are the foods he east at home.    He denied missing any dialysis appointments in the past.  According to the patient, today's dialysis session did not improve the chest pain but did improve the shortness of breath. He did state that he is still having  shortness of breath during our conversation but not at rest.   While speaking about his anemia, patient is unknown aware of blood loss in urine or stool due to loss of vision.  Reported feeling dizzy/lightheaded as well but he denied heartburn or abdominal pain.  He denies use of aspirin/NSAIDs.  Patient stated that he is unsure if he is ever had a true blood transfusion in the past but he does recall having a colonoscopy in the past and became irritated upon further questioning.   ED Course: Patient was admitted for anemia and was transported to hemodialysis requiring 3 L oxygen via nasal cannula.  After hemodialysis, acute hypoxic respiratory failure resolved and no longer required oxygen in the emergency department.   Meds:  Tylenol, aspirin, Lipitor, PhosLo, Coreg, ferric citrate, gabapentin, Imdur, nitroglycerin, benicar, protonix, oxycodone  No outpatient medications have been marked as taking for the 01/03/23 encounter [facility] Encounter).    Past Medical History  Past Surgical History:  Procedure Laterality Date   BASCILIC VEIN TRANSPOSITION Right 10/16/2019   Procedure: Basilic Vein Transposition Right Arm;  Surgeon: Nada Libman, MD;  Location: [facility] OR;  Service: Vascular;  Laterality: Right;   BASCILIC VEIN TRANSPOSITION Right 01/29/2020   Procedure: RIGHT ARM SECOND STAGE BASCILIC VEIN TRANSPOSITION;  Surgeon: Nada Libman, MD;  Location: [facility] OR;  Service: Vascular;  Laterality: Right;   BIOPSY  12/22/2018   Procedure: BIOPSY;  Surgeon: Carman Ching, MD;  Location: [facility] ENDOSCOPY;  Service: Endoscopy;;   CORONARY ULTRASOUND/IVUS N/A 12/13/2022   Procedure: Coronary Ultrasound/IVUS;  Surgeon: Corky Crafts, MD;  Location: [facility] INVASIVE CV LAB;  Service: Cardiovascular;  Laterality: N/A;   ESOPHAGOGASTRODUODENOSCOPY N/A 12/22/2018   Procedure: ESOPHAGOGASTRODUODENOSCOPY (EGD);  Surgeon: Carman Ching, MD;  Location: [facility] ENDOSCOPY;  Service: Endoscopy;  Laterality: N/A;   IR  FLUORO GUIDE CV LINE RIGHT  07/29/2019   IR US GUIDE VASC ACCESS RIGHT  07/29/2019   LEFT HEART CATH AND CORONARY ANGIOGRAPHY N/A 12/13/2022   Procedure: LEFT HEART CATH AND CORONARY ANGIOGRAPHY;  Surgeon: Corky Crafts, MD;  Location: [facility] INVASIVE CV LAB;  Service: Cardiovascular;  Laterality: N/A;    Social:  Lives With: Son Occupation: Makes parts for [employer] Support: Son Level of Function: Son Substances:Denied tobacco, alcohol, drug use  Family History: T2DM, Mother: stroke  Allergies: Allergies as of 01/03/2023   (No Known Allergies)    Review of Systems: A complete ROS was negative except as per HPI.   OBJECTIVE:   Physical Exam: Blood pressure (!) 150/96, pulse 69, temperature 98.7 F (37.1 C), resp. rate 16, height 6\' 1"  (1.854 m), weight 99.9 kg, SpO2 100 %.  Constitutional: well-appearing comfortably laying in bed, in no acute distress, agitated Cardiovascular: regular rate and rhythm, no m/r/g, no JVD Pulmonary/Chest: normal work of breathing on room air, lungs clear to auscultation bilaterally. No crackles  Abdominal: soft, non-tender, non-distended Neurological: alert & oriented x 3 MSK: no gross abnormalities. No pitting edema Skin: warm and dry Psych: Normal mood and affect  Labs: CBC    Component Value Date/Time   WBC 6.5 01/03/2023 0543   RBC 2.15 (L) 01/03/2023 0543   HGB 6.7 (LL) 01/03/2023 0543   HGB 9.5 (L) 01/16/2019 1517   HCT 21.0 (L) 01/03/2023 0543   HCT 29.1 (L) 01/16/2019 1517   PLT 210 01/03/2023 0543   PLT 189 01/16/2019 1517   MCV 97.7 01/03/2023 0543   MCV 92 01/16/2019 1517   MCH 31.2 01/03/2023 0543   MCHC 31.9 01/03/2023 0543   RDW 15.4 01/03/2023 0543   RDW 14.9 01/16/2019 1517   LYMPHSABS 1.2 12/22/2022 0423   LYMPHSABS 1.0 01/16/2019 1517   MONOABS 0.9 12/22/2022 0423   EOSABS 0.3 12/22/2022 0423   EOSABS 0.2 01/16/2019 1517   BASOSABS 0.0 12/22/2022 0423   BASOSABS 0.0 01/16/2019 1517     CMP     Component  Value Date/Time   NA 140 01/03/2023 0724   NA 141 01/16/2019 1517   K 3.9 01/03/2023 0724   CL 95 (L) 01/03/2023 0724   CO2 27 01/03/2023 0724   GLUCOSE 119 (H) 01/03/2023 0724   BUN 46 (H) 01/03/2023 0724   BUN 62 (H) 01/16/2019 1517   CREATININE 13.39 (H) 01/03/2023 0724   CALCIUM 8.8 (L) 01/03/2023 0724   PROT 8.1 12/22/2022 0423   ALBUMIN 3.3 (L) 12/22/2022 0423   AST 24 12/22/2022 0423   ALT 14 12/22/2022 0423   ALKPHOS 51 12/22/2022 0423   BILITOT 0.9 12/22/2022 0423   GFRNONAA 4 (L) 01/03/2023 0724   GFRAA  09/12/2022 0330    SPECIMEN HEMOLYZED. HEMOLYSIS MAY AFFECT INTEGRITY OF RESULTS.    Imaging: VAS [place] LOWER EXTREMITY ARTERIAL DUPLEX (ONLY [facility] & [facility])  Result Date: 01/03/2023 LOWER EXTREMITY ARTERIAL  DUPLEX STUDY Patient Name:  Corey Park  Date of Exam:   01/03/2023 Medical Rec #: [id number]        Accession #:    [id number] Date of Birth: 1970-12-14        Patient Gender: M Patient Age:   26 years Exam Location:  [facility] Procedure:      VAS [place] LOWER EXTREMITY ARTERIAL DUPLEX Referring Phys: JULIE HAVILAND --------------------------------------------------------------------------------  Indications: Pain and swelling in right groing following s/p heart cath on              12/13/22.  Performing Technologist: Marilynne Halsted RDMS, RVT  Examination Guidelines: A complete evaluation includes B-mode imaging, spectral Doppler, color Doppler, and power Doppler as needed of all accessible portions of each vessel. Bilateral testing is considered an integral part of a complete examination. Limited examinations for reoccurring indications may be performed as noted.  +----------+--------+-----+--------+--------+--------+ RIGHT     PSV cm/sRatioStenosisWaveformComments +----------+--------+-----+--------+--------+--------+ CFA [id number]                                   +----------+--------+-----+--------+--------+--------+ SFA Prox  123                                    +----------+--------+-----+--------+--------+--------+  Summary: Right: No evidence of pseudoaneurysm or AVF in the right groin. A large hematoma was noted measuring 6.9 x 5.4 x 3.3 cm.  See table(s) above for measurements and observations.    Preliminary    DG Chest 2 View  Result Date: 01/03/2023 CLINICAL DATA:  52 year old male with chest pain. EXAM: CHEST - 2 VIEW COMPARISON:  Portable chest 12/22/2022 and earlier. FINDINGS: Upright AP and lateral views at 0612 hours today. Continued low lung volumes. Stable cardiomegaly. Other mediastinal contours are within normal limits. Visualized tracheal air column is within normal limits. Bilateral lung base hypo ventilation. And pulmonary vascularity also appears increased. Some fluid in the pleural fissures, but no layering pleural effusion. No pneumothorax. No consolidation identified. No acute osseous abnormality identified. Abdominal Calcified aortic atherosclerosis. Negative visible bowel gas. IMPRESSION: 1. Low lung volumes with vascular congestion, trace pleural fluid, and worsening bibasilar ventilation. Favor acute interstitial edema with atelectasis. 2. Aortic Atherosclerosis (ICD10-I70.0).  Cardiomegaly. Electronically Signed   By: [name] M.D.   On: 01/03/2023 06:22      EKG: personally reviewed my interpretation is sinus rhythm, ST elevation in leads V1, 2, 3, ST depression in leads II, 3, aVF, QT prolonged which is similar to prior EKG on December 22, 2022.  ASSESSMENT & PLAN:   Assessment & Plan by Problem: Principal Problem:   Chest pain, atypical   Corey Park is a 51 y.o. person living with a history of recovered systolic heart failure, diabetes mellitus, who presented with acute hypoxic respiratory failure and admitted for symptomatic normocytic anemia on hospital day 0  #Normocytic anemia Patient's hemoglobin in the emergency department was recorded as 6.7.  Due to patient's loss of vision, cause of blood loss is unclear.   Patient is also unaware whether or not he receives EPO during dialysis.  Patient verbally consented to blood transfusions in front of myself and Dr. Lily Kocher. -Will transfuse patient with 2 units of blood, recheck CBC in the morning. -Will collect urine to determine cause -Will check FOBT -Continue 40 mg Protonix  #Acute hypoxic respiratory failure-resolved  Patient's acute hypoxic respiratory failure likely due to fluid overload.  Chest x-ray today demonstrated with a congested along with trace pleural effusion noted and worsening bibasilar ventilation.  Patient did receive dialysis this afternoon where they removed 3-1/2 L of fluid.  Upon evaluation in the emergency department he was saturating well room air after dialysis, further supporting the cause of the acute hypoxic respiratory failure being due to fluid overload. -Only breathing on room air, saturating well -Patient educated on importance of limiting salt   #Chest pain, recent NSTEMI in June 2024 Chest pain could be due to musculoskeletal causes such as costochondritis due to reproducible pain on physical exam.  Could also be due to patient's normocytic anemia, although likely.  ACS is also unlikely due to patient's stable EKG findings with ST elevations and depressions that were on previous EKGs; however, given the patient's elevated heart score and elevated troponins if the troponins continue to trend upwards, overnight team will consult cardiology. -Patient did have elevated troponins this morning, will continue to trend every 2 hours due to cardiac history\   -Cardiology if troponins continue to rise -Patient received sublingual nitro during exam, will assess if nitroglycerin helped the morning -Will avoid NSAIDs for pain due to unknown cause of anemia   #ESRD Patient received dialysis this evening and does not appear to be fluid loaded on exam -Continue HD -Continue aranesp injection 100 mcg  #Type 2 diabetes -Does not appear to  be on a home regimen -Monitor with CBGs every 4 hours, may add sliding scale insulin if persistently elevated  #Hypertension Will start carvedilol  -Can restart losartan if his blood pressure becomes perceptively hypertensive   Diet: Renal VTE: Heparin Code: Full  Prior to Admission Living Arrangement: Home, living son Anticipated Discharge Location: Home  Dispo: Admit patient to Observation with expected length of stay less than 2 midnights.  Signed:   Faith Rogue DO Internal Medicine Resident PGY-1 01/03/2023, 5:29 PM   Please contact the on call pager after 5 pm and on weekends at [phone].

## 2023-01-03 NOTE — ED Triage Notes (Signed)
Patient BIB [facility] from home with c/o chest pain & SOB. Patient reports pain 10/10, worse when laying down.

## 2023-01-03 NOTE — ED Notes (Signed)
Vascular at bedside

## 2023-01-03 NOTE — Consult Note (Addendum)
Renal Service Consult Note [facility]  Corey Park 01/03/2023 [name], MD Requesting Physician: Dr. Particia Nearing  Reason for Consult: ESRD pt w/ chest pain and SOB HPI: The patient is a 52 y.o. year-old w/ PMH as below who presented to ED early today for CP and SOB. +orthopnea. CP 10/10. Work up showed trop 121, 144.  BNP 299.  CXR showed pulm edema. Creat 13, BUN 46, K 3.9. Pt is to be admitted. We are asked to see for dialysis.   Pt seen in ED.  Pt seen in ED room. Has not complaints. Does not usually swell in the legs when he has volume on.   ROS - denies CP, no joint pain, no HA, no blurry vision, no rash, no diarrhea, no nausea/ vomiting, no dysuria, no difficulty voiding   Past Medical History  Past Medical History:  Diagnosis Date   Acute CHF (congestive heart failure) (HCC) 07/27/2019   AKI (acute kidney injury) (HCC) 12/11/2016   Allergy, unspecified, initial encounter 08/25/2019   Anemia 2021   Anesthesia of skin 03/31/2020   Asthma    as a child   Blind    Cellulitis, perineum 11/26/2019   CHF (congestive heart failure) (HCC) 2021   Chronic kidney disease, stage V (HCC) 06/17/2018   Chronic kidney disease, stage V (HCC) 06/17/2018   Complication of vascular dialysis catheter 08/25/2019   COVID-19 virus infection 07/27/2019   Diabetes mellitus without complication (HCC)    type 2   Dilated cardiomyopathy (HCC) 07/03/2018   Elevated troponin    Encounter for removal of sutures 08/04/2020   Fe deficiency anemia 12/23/2018   Fluid overload 12/11/2016   Hypertension    Hypertensive urgency 12/11/2016   Hypomagnesemia 12/13/2016   Legally blind    B/L   Pain, unspecified 10/17/2019   Pneumonia 2021   Renal disorder    dialysis T-TH-Sat   Shortness of breath 11/10/2019   Symptomatic anemia 12/21/2018   Type 2 diabetes mellitus with diabetic peripheral angiopathy without gangrene (HCC) 08/25/2019   Unspecified protein-calorie malnutrition (HCC) 08/25/2019    Past Surgical History  Past Surgical History:  Procedure Laterality Date   BASCILIC VEIN TRANSPOSITION Right 10/16/2019   Procedure: Basilic Vein Transposition Right Arm;  Surgeon: Nada Libman, MD;  Location: [facility] OR;  Service: Vascular;  Laterality: Right;   BASCILIC VEIN TRANSPOSITION Right 01/29/2020   Procedure: RIGHT ARM SECOND STAGE BASCILIC VEIN TRANSPOSITION;  Surgeon: Nada Libman, MD;  Location: [facility] OR;  Service: Vascular;  Laterality: Right;   BIOPSY  12/22/2018   Procedure: BIOPSY;  Surgeon: Carman Ching, MD;  Location: [facility] ENDOSCOPY;  Service: Endoscopy;;   CORONARY ULTRASOUND/IVUS N/A 12/13/2022   Procedure: Coronary Ultrasound/IVUS;  Surgeon: Corky Crafts, MD;  Location: [facility] INVASIVE CV LAB;  Service: Cardiovascular;  Laterality: N/A;   ESOPHAGOGASTRODUODENOSCOPY N/A 12/22/2018   Procedure: ESOPHAGOGASTRODUODENOSCOPY (EGD);  Surgeon: Carman Ching, MD;  Location: [facility] ENDOSCOPY;  Service: Endoscopy;  Laterality: N/A;   IR FLUORO GUIDE CV LINE RIGHT  07/29/2019   IR US GUIDE VASC ACCESS RIGHT  07/29/2019   LEFT HEART CATH AND CORONARY ANGIOGRAPHY N/A 12/13/2022   Procedure: LEFT HEART CATH AND CORONARY ANGIOGRAPHY;  Surgeon: Corky Crafts, MD;  Location: [facility] INVASIVE CV LAB;  Service: Cardiovascular;  Laterality: N/A;   Family History  Family History  Problem Relation Age of Onset   CAD Mother    Hypertension Mother    Diabetes Neg Hx    Stroke Neg  Hx    Cancer Neg Hx    Kidney failure Neg Hx    Stomach cancer Neg Hx    Colon cancer Neg Hx    Rectal cancer Neg Hx    Social History  reports that he has never smoked. He has never used smokeless tobacco. He reports that he does not currently use alcohol. He reports that he does not currently use drugs after having used the following drugs: Marijuana. Allergies No Known Allergies Home medications Prior to Admission medications   Medication Sig Start Date End Date Taking? Authorizing Provider  acetaminophen  (TYLENOL) 325 MG tablet Take 2 tablets (650 mg total) by mouth every 6 (six) hours as needed for up to 30 doses for moderate pain or mild pain. 12/22/22   Terald Sleeper, MD  aspirin EC 81 MG tablet Take 1 tablet (81 mg total) by mouth daily. Swallow whole. 12/14/22   Modena Slater, DO  atorvastatin (LIPITOR) 80 MG tablet Take 1 tablet (80 mg total) by mouth daily. 12/15/22   Modena Slater, DO  calcium acetate (PHOSLO) 667 MG capsule Take 2 capsules (1,334 mg total) by mouth with breakfast, with lunch, and with evening meal. 12/14/22   Modena Slater, DO  carvedilol (COREG) 25 MG tablet Take 1 tablet (25 mg total) by mouth 2 (two) times daily with a meal. 12/14/22   Modena Slater, DO  diclofenac Sodium (VOLTAREN) 1 % GEL Apply 2 g topically 4 (four) times daily as needed (Pain). 10/20/22   Tiffany Kocher, DO  ferric citrate (AURYXIA) 1 GM 210 MG(Fe) tablet Take 2 tablets (420 mg total) by mouth daily. 12/14/22   Modena Slater, DO  gabapentin (NEURONTIN) 100 MG capsule Take 1 capsule (100 mg total) by mouth 3 (three) times daily. 12/14/22   Modena Slater, DO  isosorbide mononitrate (IMDUR) 30 MG 24 hr tablet Take 30 mg by mouth daily. 12/20/22 01/19/23  [provider]  Multiple Vitamins-Minerals (CERTAVITE/ANTIOXIDANTS) TABS Take 1 tablet by mouth at bedtime. 10/20/22   Tiffany Kocher, DO  multivitamin (RENA-VIT) TABS tablet Take 1 tablet by mouth daily. 11/22/22   [provider]  nitroGLYCERIN (NITROSTAT) 0.4 MG SL tablet Place 0.4 mg under the tongue every 5 (five) minutes as needed for chest pain. 12/19/22   [provider]  olmesartan (BENICAR) 20 MG tablet Take 1 tablet (20 mg total) by mouth daily. 12/14/22   Modena Slater, DO  ondansetron (ZOFRAN-ODT) 4 MG disintegrating tablet Take 1 tablet (4 mg total) by mouth every 8 (eight) hours as needed for up to 12 doses for nausea or vomiting. 12/22/22   Trifan, Kermit Balo, MD  oxyCODONE (ROXICODONE) 5 MG immediate release tablet Take 1 tablet (5 mg  total) by mouth every 12 (twelve) hours as needed for up to 12 doses for severe pain. 12/22/22   Terald Sleeper, MD  pantoprazole (PROTONIX) 40 MG tablet Take 1 tablet (40 mg total) by mouth daily. 12/14/22 01/13/23  Modena Slater, DO     Vitals:   01/03/23 0900 01/03/23 1000 01/03/23 1100 01/03/23 1130  BP: 131/87 (!) 140/73 124/61 124/76  Pulse: 70 71 66 67  Resp: 16 15 12 17   Temp:      TempSrc:      SpO2: 100% 100% 100% 99%  Weight:      Height:       Exam Gen tired appearing, no distress, on RA No rash, cyanosis or gangrene Sclera anicteric, throat clear  No jvd or bruits Chest  clear bilat to bases, no rales/ wheezing RRR no MRG Abd soft ntnd no mass or ascites +bs GU normal male MS no joint effusions or deformity Ext no LE or UE edema, no wounds or ulcers Neuro is alert, Ox 3 , nf    RUA AVF+bruit      Home meds include - asa, lipitor, phoslo 2 ac, coreg 25 bid, auryxia 420 ac tid, gabapentin 100 tid, imdru 30, renavite, sl ntg, olmesartan 20 every day, oxy IR prn, protonix, prns/ vits/ supps   CXR - Low lung volumes with vascular congestion, trace pleural fluid, and worsening bibasilar ventilation. Favor acute interstitial edema with atelectasis. Cardiomegaly.   OP HD: MWF [facility]  4h  450/ 1.5   100.5kg   2/2 bath  AVF  Heparin none - last OP HD 7/08, post wt 100.7kg, coming off 1-2kg under dry wt - rocaltrol 1.5 mcg po three times per week - mircera 200 mcg IV q 4 wks, last 6/5, due 7/3 (missed)   Assessment/ Plan: Chest pain - w/u in progress, per ED/ pmd.  SOB - w/ IS edema by CXR. Likely due to vol overload. Missed 1 HD last week. May be losing body wt. Max UF w/ HD this afternoon.  ESRD - on HD MWF. Last OP HD Monday. Plan HD today.  HTN/ volume - BP's are wnl, CXR looks wet. HD as above.  Anemia esrd - Hb low at 6.7, needs transfusion but apparently he has a "blood products refusal" icon on his chart. Defer to primary team. Will dose aranesp weekly sq  while here (missed his last esa date which was 7/3 but was on q 4 wks so will 1/2 the aranesp dose). Check fe/ tibc.  MBD ckd - Ca in range, add on phos/ alb. Cont binder and rocaltrol.     Vinson Moselle  MD [facility] 01/03/2023, 11:56 AM  Recent Labs  Lab 01/03/23 0543 01/03/23 0724  HGB 6.7*  --   CALCIUM  --  8.8*  CREATININE  --  13.39*  K  --  3.9   Inpatient medications:  sodium chloride flush  3 mL Intravenous Once    nitroGLYCERIN

## 2023-01-03 NOTE — ED Notes (Signed)
Per lab, green top hemolyzed; will redraw

## 2023-01-03 NOTE — Progress Notes (Signed)
   01/03/23 1713  Vitals  Temp 98.7 F (37.1 C)  Pulse Rate 69  Resp 16  BP (!) 150/96  SpO2 100 %  O2 Device Nasal Cannula  Oxygen Therapy  O2 Flow Rate (L/min) 2 L/min  Patient Activity (if Appropriate) In bed  Pulse Oximetry Type Continuous  Post Treatment  Dialyzer Clearance Clear  Duration of HD Treatment -hour(s) 3.5 hour(s)  Hemodialysis Intake (mL) 0 mL  Liters Processed 72.3  Fluid Removed (mL) 3500 mL  Tolerated HD Treatment Yes   Received patient in bed to unit.  Alert and oriented.  Informed consent signed and in chart.   TX duration: 3:5  Patient tolerated well.  Transported back to the room  Alert, without acute distress.  Hand-off given to patient's nurse.   Access used: Yes Access issues: No  Total UF removed: 3500 Medication(s) given: See MAR Post HD VS: See Above Grid Post HD weight: No weight bed   [facility]

## 2023-01-03 NOTE — ED Notes (Signed)
Responded to call light pt became verbally abusive. Tried to explained to pt that I was medicating another pt & couldn't leave immediately. Pt continued to threaten & verbally abuse this RN

## 2023-01-04 ENCOUNTER — Encounter (HOSPITAL_COMMUNITY): Payer: Self-pay | Admitting: Internal Medicine

## 2023-01-04 DIAGNOSIS — R0789 Other chest pain: Secondary | ICD-10-CM | POA: Diagnosis not present

## 2023-01-04 LAB — CBC
HCT: 27.4 % — ABNORMAL LOW (ref 39.0–52.0)
Hemoglobin: 9 g/dL — ABNORMAL LOW (ref 13.0–17.0)
MCH: 31.6 pg (ref 26.0–34.0)
MCHC: 32.8 g/dL (ref 30.0–36.0)
MCV: 96.1 fL (ref 80.0–100.0)
Platelets: 203 10*3/uL (ref 150–400)
RBC: 2.85 MIL/uL — ABNORMAL LOW (ref 4.22–5.81)
RDW: 15.9 % — ABNORMAL HIGH (ref 11.5–15.5)
WBC: 6.6 10*3/uL (ref 4.0–10.5)
nRBC: 0 % (ref 0.0–0.2)

## 2023-01-04 LAB — TYPE AND SCREEN
Antibody Screen: NEGATIVE
Unit division: 0

## 2023-01-04 LAB — BASIC METABOLIC PANEL
Anion gap: 16 — ABNORMAL HIGH (ref 5–15)
BUN: 36 mg/dL — ABNORMAL HIGH (ref 6–20)
CO2: 30 mmol/L (ref 22–32)
Calcium: 9 mg/dL (ref 8.9–10.3)
Chloride: 92 mmol/L — ABNORMAL LOW (ref 98–111)
Creatinine, Ser: 10.16 mg/dL — ABNORMAL HIGH (ref 0.61–1.24)
GFR, Estimated: 6 mL/min — ABNORMAL LOW (ref >60)
Glucose, Bld: 138 mg/dL — ABNORMAL HIGH (ref 70–99)
Potassium: 4.1 mmol/L (ref 3.5–5.1)
Sodium: 138 mmol/L (ref 135–145)

## 2023-01-04 LAB — BPAM RBC: ISSUE DATE / TIME: 202407102309

## 2023-01-04 LAB — TROPONIN I (HIGH SENSITIVITY): Troponin I (High Sensitivity): 162 ng/L (ref <18)

## 2023-01-04 MED ORDER — ISOSORBIDE MONONITRATE ER 30 MG PO TB24: 30.0000 mg | ORAL_TABLET | Freq: Every day | ORAL | Status: DC

## 2023-01-04 MED ORDER — CARVEDILOL 12.5 MG PO TABS: 25.0000 mg | ORAL_TABLET | Freq: Two times a day (BID) | ORAL | Status: DC

## 2023-01-04 MED ORDER — DARBEPOETIN ALFA 100 MCG/0.5ML IJ SOSY
100.0000 ug | PREFILLED_SYRINGE | Freq: Once | INTRAMUSCULAR | Status: AC
  Administered 2023-01-04: 100 ug via SUBCUTANEOUS
  Filled 2023-01-04: qty 0.5

## 2023-01-04 MED ORDER — DARBEPOETIN ALFA 100 MCG/0.5ML IJ SOSY: 100.0000 ug | PREFILLED_SYRINGE | INTRAMUSCULAR | Status: DC

## 2023-01-04 MED ORDER — CARVEDILOL 12.5 MG PO TABS
25.0000 mg | ORAL_TABLET | Freq: Two times a day (BID) | ORAL | Status: DC
  Administered 2023-01-04: 25 mg via ORAL
  Filled 2023-01-04: qty 2

## 2023-01-04 NOTE — Progress Notes (Addendum)
[name] Progress Note   Subjective: Seen in room being examined by Cardiology. HGB now 9.2 Net UF 3.5 liters with HD 01/03/2023  Objective Vitals:   01/04/23 0600 01/04/23 0717 01/04/23 0956 01/04/23 1341  BP: (!) 152/68  (!) 157/88 (!) 150/77  Pulse: 70  71 66  Resp: 19  17 16   Temp:  98 F (36.7 C) 98.1 F (36.7 C) 98.4 F (36.9 C)  TempSrc:  Oral Oral Oral  SpO2: 97%  99% 98%  Weight:      Height:       Physical Exam General: Chronically ill appearing male in NAD Heart:S1,S2 RRR No M/R/G Lungs: CTAB Abdomen:soft, NABS Extremities:No LE edema Dialysis Access: R AVF + T/B    Additional Objective Labs: Basic Metabolic Panel: Recent Labs  Lab 01/03/23 0724 01/04/23 0503  NA 140 138  K 3.9 4.1  CL 95* 92*  CO2 27 30  GLUCOSE 119* 138*  BUN 46* 36*  CREATININE 13.39* 10.16*  CALCIUM 8.8* 9.0   Liver Function Tests: No results for input(s): "AST", "ALT", "ALKPHOS", "BILITOT", "PROT", "ALBUMIN" in the last 168 hours. No results for input(s): "LIPASE", "AMYLASE" in the last 168 hours. CBC: Recent Labs  Lab 01/03/23 0543 01/04/23 0503  WBC 6.5 6.6  HGB 6.7* 9.0*  HCT 21.0* 27.4*  MCV 97.7 96.1  PLT 210 203   Blood Culture    Component Value Date/Time   SDES BLOOD SITE NOT SPECIFIED 04/17/2022 0720   SPECREQUEST  04/17/2022 0720    BOTTLES DRAWN AEROBIC AND ANAEROBIC Blood Culture adequate volume   CULT  04/17/2022 0720    NO GROWTH 5 DAYS Performed at [facility], [address] [place], [place] [address]    REPTSTATUS 04/22/2022 FINAL 04/17/2022 0720    Cardiac Enzymes: No results for input(s): "CKTOTAL", "CKMB", "CKMBINDEX", "TROPONINI" in the last 168 hours. CBG: No results for input(s): "GLUCAP" in the last 168 hours. Iron Studies: No results for input(s): "IRON", "TIBC", "TRANSFERRIN", "FERRITIN" in the last 72 hours. @lablastinr3 @ Studies/Results: VAS [place] LOWER EXTREMITY ARTERIAL DUPLEX (ONLY [facility] & [facility])  Result  Date: 01/03/2023 LOWER EXTREMITY ARTERIAL DUPLEX STUDY Patient Name:  Corey Park  Date of Exam:   01/03/2023 Medical Rec #: [id number]        Accession #:    [id number] Date of Birth: 04/19/1971        Patient Gender: M Patient Age:   52 years Exam Location:  [facility] Procedure:      VAS [place] LOWER EXTREMITY ARTERIAL DUPLEX Referring Phys: JULIE HAVILAND --------------------------------------------------------------------------------  Indications: Pain and swelling in right groing following s/p heart cath on              12/13/22.  Performing Technologist: Marilynne Halsted RDMS, RVT  Examination Guidelines: A complete evaluation includes B-mode imaging, spectral Doppler, color Doppler, and power Doppler as needed of all accessible portions of each vessel. Bilateral testing is considered an integral part of a complete examination. Limited examinations for reoccurring indications may be performed as noted.  +----------+--------+-----+--------+--------+--------+ RIGHT     PSV cm/sRatioStenosisWaveformComments +----------+--------+-----+--------+--------+--------+ CFA [id number]                                   +----------+--------+-----+--------+--------+--------+ SFA Prox  123                                   +----------+--------+-----+--------+--------+--------+  Summary: Right: No evidence of pseudoaneurysm or AVF in the right groin. A large hematoma was noted measuring 6.9 x 5.4 x 3.3 cm.  See table(s) above for measurements and observations. Electronically signed by [name] on 01/03/2023 at 5:37:56 PM.    Final    DG Chest 2 View  Result Date: 01/03/2023 CLINICAL DATA:  52 year old male with chest pain. EXAM: CHEST - 2 VIEW COMPARISON:  Portable chest 12/22/2022 and earlier. FINDINGS: Upright AP and lateral views at 0612 hours today. Continued low lung volumes. Stable cardiomegaly. Other mediastinal contours are within normal limits. Visualized tracheal air column is within  normal limits. Bilateral lung base hypo ventilation. And pulmonary vascularity also appears increased. Some fluid in the pleural fissures, but no layering pleural effusion. No pneumothorax. No consolidation identified. No acute osseous abnormality identified. Abdominal Calcified aortic atherosclerosis. Negative visible bowel gas. IMPRESSION: 1. Low lung volumes with vascular congestion, trace pleural fluid, and worsening bibasilar ventilation. Favor acute interstitial edema with atelectasis. 2. Aortic Atherosclerosis (ICD10-I70.0).  Cardiomegaly. Electronically Signed   By: [name] M.D.   On: 01/03/2023 06:22   Medications:   calcitRIOL  1.5 mcg Oral Q M,W,F   carvedilol  25 mg Oral BID WC   Chlorhexidine Gluconate Cloth  6 each Topical Q0600   ferric citrate  420 mg Oral TID WC   heparin  5,000 Units Subcutaneous Q8H   sodium chloride flush  3 mL Intravenous Once     OP HD: MWF [facility]  4h  450/ 1.5   100.5kg   2/2 bath  AVF  Heparin none - last OP HD 7/08, post wt 100.7kg, coming off 1-2kg under dry wt - rocaltrol 1.5 mcg po three times per week - mircera 200 mcg IV q 4 wks, last 6/5, due 7/3 (missed)     Assessment/ Plan: Chest pain - w/u in progress, per ED/ pmd. Seen by Cardiology. Not ACS.  SOB - w/ IS edema by CXR. Likely due to vol overload. Missed 1 HD last week. May be losing body wt. Max UF w/ HD this afternoon.  ESRD - on HD MWF.  Next HD 01/05/2023. Can have HD at OP clinic.  HTN/ volume - BP's are wnl, CXR looks wet. Net UF 3.5 liters with HD 01/03/2023. Continue lowering volume as tolerated.    Anemia esrd - Hb low at 6.7 S/P 1 units PRBC 01/04/2023. Will dose aranesp weekly sq while here (missed his last esa date which was 7/3 but was on q 4 wks so will 1/2 the aranesp dose). Tsat 34 Fe 69 Ferritin 911. No Fe needed.  MBD ckd - Ca in range, add on phos/ alb. Cont binder and rocaltrol.     [name] Phyllicia Dudek NP-C 01/04/2023, 2:00 PM  [employer] [phone]

## 2023-01-04 NOTE — Consult Note (Signed)
Cardiology Consultation:   Patient ID: Lem Peary; [id number]; 16-Dec-1970   Admit date: 01/03/2023 Date of Consult: 01/04/2023  Primary Care Provider: [name], NP Primary Cardiologist: Donato Schultz, MD  Primary Electrophysiologist:  None   Patient Profile:   Julious Langlois is a 52 y.o. male  is being seen today for the evaluation of chest pain at the request of Dr. Renaye Rakers.     History of Present Illness:   Mr. Winkels This is a 52 yo male with ESRD , HTN, DM2.  He presented earlier this year with chest pain and we were consulted and he ultimately underwent LHC on 12/13/2022.  This demonstrated mild to moderate CAD with 25% distal left main to proximal LAD, 50% proximal LAD, 25% ostial circumflex stenosis, but otherwise no significant stenosis.  LVEF was normal.  Echo also showed normal LVEF 60 to 65% with moderate LVH and grade 2 diastolic dysfunction.   He presents now with chest pain.   EKG demonstrates no change from previous distant EKGs although the degree of ST depression seems to wax and wane.  Enzymes are elevated non diagnostic as the trend is consistent with acute volume overload in a patient with dialysis dependent renal failure.   He says started having some chest pain the night before presentation.  It is sharp and he points to the left axillary area.  He denies any associated symptoms.  This is similar to the pain he had last month when he had his cath and at other times when he has presented as well.  He is also complaining of groin pain at the right femoral access site since his cath in June.   In the ED he is found to have severe anemia.  He had acute dyspnea and was thought to be volume overloaded.  He had improvement in his symptoms after dialysis.  He is also status post blood transfusion.     He says that aside from the complaints above he has done OK.  The patient denies any new symptoms such as neck or arm discomfort. There has been no new shortness of  breath, PND or orthopnea. There have been no reported palpitations, presyncope or syncope.   Of note he has had ongoing groin pain but no pseudoaneurysm on ultrasound yesterday.    Past Medical History:  Diagnosis Date   Acute CHF (congestive heart failure) (HCC) 07/27/2019   Allergy, unspecified, initial encounter 08/25/2019   Anemia 2021   Anesthesia of skin 03/31/2020   Asthma    as a child   Blind    Cellulitis, perineum 11/26/2019   CHF (congestive heart failure) (HCC) 2021   Complication of vascular dialysis catheter 08/25/2019   COVID-19 virus infection 07/27/2019   Diabetes mellitus without complication (HCC)    type 2   Dilated cardiomyopathy (HCC) 07/03/2018   Elevated troponin    Encounter for removal of sutures 08/04/2020   ESRD on hemodialysis (HCC) 06/17/2018   MWF at [facility]   Fe deficiency anemia 12/23/2018   Hypertension    Hypertensive urgency 12/11/2016   Hypomagnesemia 12/13/2016   Legally blind    B/L   Pain, unspecified 10/17/2019   Pneumonia 2021   Symptomatic anemia 12/21/2018   Type 2 diabetes mellitus with diabetic peripheral angiopathy without gangrene (HCC) 08/25/2019   Unspecified protein-calorie malnutrition (HCC) 08/25/2019    Past Surgical History:  Procedure Laterality Date   BASCILIC VEIN TRANSPOSITION Right 10/16/2019   Procedure: Basilic Vein Transposition Right Arm;  Surgeon: Nada Libman, MD;  Location: [facility] OR;  Service: Vascular;  Laterality: Right;   BASCILIC VEIN TRANSPOSITION Right 01/29/2020   Procedure: RIGHT ARM SECOND STAGE BASCILIC VEIN TRANSPOSITION;  Surgeon: Nada Libman, MD;  Location: [facility] OR;  Service: Vascular;  Laterality: Right;   BIOPSY  12/22/2018   Procedure: BIOPSY;  Surgeon: Carman Ching, MD;  Location: [facility] ENDOSCOPY;  Service: Endoscopy;;   CORONARY ULTRASOUND/IVUS N/A 12/13/2022   Procedure: Coronary Ultrasound/IVUS;  Surgeon: Corky Crafts, MD;  Location: [facility] INVASIVE CV LAB;  Service: Cardiovascular;   Laterality: N/A;   ESOPHAGOGASTRODUODENOSCOPY N/A 12/22/2018   Procedure: ESOPHAGOGASTRODUODENOSCOPY (EGD);  Surgeon: Carman Ching, MD;  Location: [facility] ENDOSCOPY;  Service: Endoscopy;  Laterality: N/A;   IR FLUORO GUIDE CV LINE RIGHT  07/29/2019   IR US GUIDE VASC ACCESS RIGHT  07/29/2019   LEFT HEART CATH AND CORONARY ANGIOGRAPHY N/A 12/13/2022   Procedure: LEFT HEART CATH AND CORONARY ANGIOGRAPHY;  Surgeon: Corky Crafts, MD;  Location: [facility] INVASIVE CV LAB;  Service: Cardiovascular;  Laterality: N/A;     Home Medications:  Prior to Admission medications   Medication Sig Start Date End Date Taking? Authorizing Provider  acetaminophen (TYLENOL) 325 MG tablet Take 2 tablets (650 mg total) by mouth every 6 (six) hours as needed for up to 30 doses for moderate pain or mild pain. 12/22/22   Terald Sleeper, MD  aspirin EC 81 MG tablet Take 1 tablet (81 mg total) by mouth daily. Swallow whole. 12/14/22   Modena Slater, DO  atorvastatin (LIPITOR) 80 MG tablet Take 1 tablet (80 mg total) by mouth daily. 12/15/22   Modena Slater, DO  calcium acetate (PHOSLO) 667 MG capsule Take 2 capsules (1,334 mg total) by mouth with breakfast, with lunch, and with evening meal. 12/14/22   Modena Slater, DO  carvedilol (COREG) 25 MG tablet Take 1 tablet (25 mg total) by mouth 2 (two) times daily with a meal. 12/14/22   Modena Slater, DO  diclofenac Sodium (VOLTAREN) 1 % GEL Apply 2 g topically 4 (four) times daily as needed (Pain). 10/20/22   Tiffany Kocher, DO  ferric citrate (AURYXIA) 1 GM 210 MG(Fe) tablet Take 2 tablets (420 mg total) by mouth daily. 12/14/22   Modena Slater, DO  gabapentin (NEURONTIN) 100 MG capsule Take 1 capsule (100 mg total) by mouth 3 (three) times daily. 12/14/22   Modena Slater, DO  isosorbide mononitrate (IMDUR) 30 MG 24 hr tablet Take 30 mg by mouth daily. 12/20/22 01/19/23  [provider]  Multiple Vitamins-Minerals (CERTAVITE/ANTIOXIDANTS) TABS Take 1 tablet by mouth at bedtime. 10/20/22    Tiffany Kocher, DO  multivitamin (RENA-VIT) TABS tablet Take 1 tablet by mouth daily. 11/22/22   [provider]  nitroGLYCERIN (NITROSTAT) 0.4 MG SL tablet Place 0.4 mg under the tongue every 5 (five) minutes as needed for chest pain. 12/19/22   [provider]  olmesartan (BENICAR) 20 MG tablet Take 1 tablet (20 mg total) by mouth daily. 12/14/22   Modena Slater, DO  ondansetron (ZOFRAN-ODT) 4 MG disintegrating tablet Take 1 tablet (4 mg total) by mouth every 8 (eight) hours as needed for up to 12 doses for nausea or vomiting. 12/22/22   Trifan, Kermit Balo, MD  oxyCODONE (ROXICODONE) 5 MG immediate release tablet Take 1 tablet (5 mg total) by mouth every 12 (twelve) hours as needed for up to 12 doses for severe pain. 12/22/22   Terald Sleeper, MD  pantoprazole (PROTONIX) 40 MG tablet Take  1 tablet (40 mg total) by mouth daily. 12/14/22 01/13/23  Modena Slater, DO    Inpatient Medications: Scheduled Meds:  calcitRIOL  1.5 mcg Oral Q M,W,F   carvedilol  25 mg Oral BID WC   Chlorhexidine Gluconate Cloth  6 each Topical Q0600   darbepoetin (ARANESP) injection - DIALYSIS  100 mcg Subcutaneous Once   ferric citrate  420 mg Oral TID WC   heparin  5,000 Units Subcutaneous Q8H   sodium chloride flush  3 mL Intravenous Once   Continuous Infusions:  PRN Meds: acetaminophen **OR** acetaminophen, nitroGLYCERIN, senna-docusate  Allergies:   No Known Allergies  Social History:   Social History   Socioeconomic History   Marital status: Single    Spouse name: Not on file   Number of children: Not on file   Years of education: Not on file   Highest education level: Not on file  Occupational History   Not on file  Tobacco Use   Smoking status: Never   Smokeless tobacco: Never  Vaping Use   Vaping status: Never Used  Substance and Sexual Activity   Alcohol use: Not Currently   Drug use: Not Currently    Types: Marijuana   Sexual activity: Not on file  Other Topics Concern    Not on file  Social History Narrative   Not on file   Social Determinants of Health   Financial Resource Strain: Not on file  Food Insecurity: No Food Insecurity (12/11/2022)   Hunger Vital Sign    Worried About Running Out of Food in the Last Year: Never true    Ran Out of Food in the Last Year: Never true  Transportation Needs: No Transportation Needs (12/11/2022)   PRAPARE - Administrator, Civil Service (Medical): No    Lack of Transportation (Non-Medical): No  Recent Concern: Transportation Needs - Unmet Transportation Needs (10/19/2022)   PRAPARE - Administrator, Civil Service (Medical): Yes    Lack of Transportation (Non-Medical): Yes  Physical Activity: Not on file  Stress: Not on file  Social Connections: Not on file  Intimate Partner Violence: Not At Risk (12/11/2022)   Humiliation, Afraid, Rape, and Kick questionnaire    Fear of Current or Ex-Partner: No    Emotionally Abused: No    Physically Abused: No    Sexually Abused: No    Family History:    Family History  Problem Relation Age of Onset   CAD Mother    Hypertension Mother    Diabetes Neg Hx    Stroke Neg Hx    Cancer Neg Hx    Kidney failure Neg Hx    Stomach cancer Neg Hx    Colon cancer Neg Hx    Rectal cancer Neg Hx      ROS:  Please see the history of present illness.  All other ROS reviewed and negative.     Physical Exam/Data:   Vitals:   01/04/23 0400 01/04/23 0600 01/04/23 0717 01/04/23 0956  BP: (!) 154/68 (!) 152/68  (!) 157/88  Pulse:  70  71  Resp: 17 19  17   Temp:   98 F (36.7 C) 98.1 F (36.7 C)  TempSrc:   Oral Oral  SpO2:  97%  99%  Weight:      Height:        Intake/Output Summary (Last 24 hours) at 01/04/2023 1309 Last data filed at 01/04/2023 0758 Gross per 24 hour  Intake 415 ml  Output 3800 ml  Net -3385 ml   Filed Weights   01/03/23 0545 01/03/23 1330  Weight: 99.9 kg 99.9 kg   Body mass index is 29.06 kg/m.  GENERAL:   Wellappearing HEENT:   Pupils equal round and reactive, fundi not visualized, oral mucosa unremarkable NECK:  No  jugular venous distention, waveform within normal limits, carotid upstroke brisk and symmetric, no bruits, no thyromegaly LYMPHATICS:  No cervical, inguinal adenopathy LUNGS:   Clear to auscultation bilaterally BACK:  No CVA tenderness CHEST:   Unremarkable HEART:  PMI not displaced or sustained,S1 and S2 within normal limits, no S3, no S4, no clicks, no rubs, 2/6 apical systolic murmur, no diastolic murmurs ABD:  Flat, positive bowel sounds normal in frequency in pitch, no bruits, no rebound, no guarding, no midline pulsatile mass, no hepatomegaly, no splenomegaly EXT:  2 plus pulses throughout, no  edema, no cyanosis no clubbing SKIN:  No rashes no nodules NEURO:   Cranial nerves II through XII grossly intact, motor grossly intact throughout PSYCH:    Cognitively intact, oriented to person place and time   EKG:  The EKG was personally reviewed and demonstrates:  NSR, rate 75, axis WNL, intervals WNL, T wave inversion and ST depression in the inferior and lateral leads is unchanged from previous.     Telemetry:  Telemetry was personally reviewed and demonstrates:  NA  Relevant CV Studies: June 2024 echo  1. Left ventricular ejection fraction, by estimation, is 60 to 65%. The  left ventricle has normal function. The left ventricle has no regional  wall motion abnormalities. There is moderate concentric left ventricular  hypertrophy. Left ventricular  diastolic parameters are consistent with Grade II diastolic dysfunction  (pseudonormalization).   2. Right ventricular systolic function is normal. The right ventricular  size is normal.   3. Left atrial size was mildly dilated.   4. The mitral valve is normal in structure. Trivial mitral valve  regurgitation. No evidence of mitral stenosis.   5. The aortic valve is normal in structure. Aortic valve regurgitation is  not  visualized. No aortic stenosis is present.   6. The inferior vena cava is normal in size with <50% respiratory  variability, suggesting right atrial pressure of 8 mmHg.    Diagnostic cath June 2024 Dominance: Co-dominant   Laboratory Data:  Chemistry Recent Labs  Lab 01/03/23 0724 01/04/23 0503  NA 140 138  K 3.9 4.1  CL 95* 92*  CO2 27 30  GLUCOSE 119* 138*  BUN 46* 36*  CREATININE 13.39* 10.16*  CALCIUM 8.8* 9.0  GFRNONAA 4* 6*  ANIONGAP 18* 16*    No results for input(s): "PROT", "ALBUMIN", "AST", "ALT", "ALKPHOS", "BILITOT" in the last 168 hours. Hematology Recent Labs  Lab 01/03/23 0543 01/04/23 0503  WBC 6.5 6.6  RBC 2.15* 2.85*  HGB 6.7* 9.0*  HCT 21.0* 27.4*  MCV 97.7 96.1  MCH 31.2 31.6  MCHC 31.9 32.8  RDW 15.4 15.9*  PLT 210 203   Cardiac EnzymesNo results for input(s): "TROPONINI" in the last 168 hours. No results for input(s): "TROPIPOC" in the last 168 hours.  BNP Recent Labs  Lab 01/03/23 0941  BNP 299.2*    DDimer No results for input(s): "DDIMER" in the last 168 hours.  Radiology/Studies:  VAS [place] LOWER EXTREMITY ARTERIAL DUPLEX (ONLY [facility])  Result Date: 01/03/2023 LOWER EXTREMITY ARTERIAL DUPLEX STUDY Patient Name:  MARCELINO CAMPOS  Date of Exam:   01/03/2023 Medical Rec #: [id number]  Accession #:    [id number] Date of Birth: 05-21-1971        Patient Gender: M Patient Age:   41 years Exam Location[facility]  [facility] Procedure:      VAS [place] LOWER EXTREMITY ARTERIAL DUPLEX Referring Phys: JULIE HAVILAND --------------------------------------------------------------------------------  Indications: Pain and swelling in right groing following s/p heart cath on              12/13/22.  Performing Technologist: Marilynne Halsted RDMS, RVT  Examination Guidelines: A complete evaluation includes B-mode imaging, spectral Doppler, color Doppler, and power Doppler as needed of all accessible portions of each vessel. Bilateral testing is considered an  integral part of a complete examination. Limited examinations for reoccurring indications may be performed as noted.  +----------+--------+-----+--------+--------+--------+ RIGHT     PSV cm/sRatioStenosisWaveformComments +----------+--------+-----+--------+--------+--------+ CFA [id number]                                   +----------+--------+-----+--------+--------+--------+ SFA Prox  123                                   +----------+--------+-----+--------+--------+--------+  Summary: Right: No evidence of pseudoaneurysm or AVF in the right groin. A large hematoma was noted measuring 6.9 x 5.4 x 3.3 cm.  See table(s) above for measurements and observations. Electronically signed by [name] on 01/03/2023 at 5:37:56 PM.    Final    DG Chest 2 View  Result Date: 01/03/2023 CLINICAL DATA:  52 year old male with chest pain. EXAM: CHEST - 2 VIEW COMPARISON:  Portable chest 12/22/2022 and earlier. FINDINGS: Upright AP and lateral views at 0612 hours today. Continued low lung volumes. Stable cardiomegaly. Other mediastinal contours are within normal limits. Visualized tracheal air column is within normal limits. Bilateral lung base hypo ventilation. And pulmonary vascularity also appears increased. Some fluid in the pleural fissures, but no layering pleural effusion. No pneumothorax. No consolidation identified. No acute osseous abnormality identified. Abdominal Calcified aortic atherosclerosis. Negative visible bowel gas. IMPRESSION: 1. Low lung volumes with vascular congestion, trace pleural fluid, and worsening bibasilar ventilation. Favor acute interstitial edema with atelectasis. 2. Aortic Atherosclerosis (ICD10-I70.0).  Cardiomegaly. Electronically Signed   By: [name] M.D.   On: 01/03/2023 06:22    Assessment and Plan:   Chest pain:  I have personally reviewed the cath done last month with IVUS.  Continue medical management.  No further testing at this time. Enzymes are not  diagnostic in this situation with mild elevation, minimal trend in a patient with severe anemia and ESRD.   It was previously suggested that the patient be started on isosorbide.   Given some dynamic change to his ST segments with EKG there could be a component of microvascular disease although I don't think current symptoms are consistent with this.  Continue current bet ablocker   Groin hematoma:  No evidence of pseudoaneurysm.  He will need continued conservative management.      For questions or updates, please contact [facility] Please consult [email or web address] for contact info under Cardiology/STEMI.   Signed, Rollene Rotunda, MD  01/04/2023 1:09 PM

## 2023-01-04 NOTE — Progress Notes (Signed)
PT Cancellation Note  Patient Details Name: Corey Park MRN: [id number] DOB: 1971-05-29   Cancelled Treatment:    Reason Eval/Treat Not Completed: PT screened, no needs identified, will sign off. Per OT pt at baseline with mobility and only requiring assist due to low vision in unfamiliar environment.   Angelina Ok [facility] 01/04/2023, 3:40 PM Skip Mayer PT Acute [employer] [phone]

## 2023-01-04 NOTE — Discharge Summary (Signed)
Name: Corey Park MRN: [id number] DOB: 02-08-1971 52 y.o. PCP: [name], NP  Date of Admission: 01/03/2023  5:28 AM Date of Discharge: 01/04/2023 3:54 PM Attending Physician: Dr. Criselda Peaches  Discharge Diagnosis: Principal Problem:   Chest pain, atypical    Discharge Medications: Allergies as of 01/04/2023   No Known Allergies      Medication List     TAKE these medications    acetaminophen 325 MG tablet Commonly known as: Tylenol Take 2 tablets (650 mg total) by mouth every 6 (six) hours as needed for up to 30 doses for moderate pain or mild pain.   aspirin EC 81 MG tablet Take 1 tablet (81 mg total) by mouth daily. Swallow whole.   atorvastatin 80 MG tablet Commonly known as: LIPITOR Take 1 tablet (80 mg total) by mouth daily.   calcium acetate 667 MG capsule Commonly known as: PHOSLO Take 2 capsules (1,334 mg total) by mouth with breakfast, with lunch, and with evening meal.   carvedilol 25 MG tablet Commonly known as: COREG Take 1 tablet (25 mg total) by mouth 2 (two) times daily with a meal.   CertaVite/Antioxidants Tabs Take 1 tablet by mouth at bedtime.   diclofenac Sodium 1 % Gel Commonly known as: VOLTAREN Apply 2 g topically 4 (four) times daily as needed (Pain).   ferric citrate 1 GM 210 MG(Fe) tablet Commonly known as: Auryxia Take 2 tablets (420 mg total) by mouth daily.   gabapentin 100 MG capsule Commonly known as: Neurontin Take 1 capsule (100 mg total) by mouth 3 (three) times daily.   isosorbide mononitrate 30 MG 24 hr tablet Commonly known as: IMDUR Take 30 mg by mouth daily.   multivitamin Tabs tablet Take 1 tablet by mouth daily.   nitroGLYCERIN 0.4 MG SL tablet Commonly known as: NITROSTAT Place 0.4 mg under the tongue every 5 (five) minutes as needed for chest pain.   olmesartan 20 MG tablet Commonly known as: BENICAR Take 1 tablet (20 mg total) by mouth daily.   ondansetron 4 MG disintegrating tablet Commonly  known as: ZOFRAN-ODT Take 1 tablet (4 mg total) by mouth every 8 (eight) hours as needed for up to 12 doses for nausea or vomiting.   oxyCODONE 5 MG immediate release tablet Commonly known as: Roxicodone Take 1 tablet (5 mg total) by mouth every 12 (twelve) hours as needed for up to 12 doses for severe pain.   pantoprazole 40 MG tablet Commonly known as: PROTONIX Take 1 tablet (40 mg total) by mouth daily.        Disposition and follow-up:   [name] was discharged from [facility] in Stable condition.  At the hospital follow up visit please address:  1.  Follow-up:  *Chest pain, MSK pain -Please ensure patient follow-up with cardiologist in 7 to 10 days -Please ensure adherence to patient's current home medication regimen -Please do not provide patient with NSAIDs for chest pain due to anemia, use Tylenol -Consider repeating echocardiogram future, echocardiogram from June shows an EF of 60-65%   *Normocytic anemia -Repeat CBC in 7 to 10 days -Please make sure patient is avoiding ibuprofen and Advil, or other NSAIDs -Please make sure patient is getting are aranesp with dialysis -Please ensure that patient is up-to-date with colonoscopy/schedule colonoscopy if needed -Monitor patient for signs of anemia/potential causes of blood loss  *ESRD -Make sure patient goes to dialysis Monday Wednesday Friday -Please ensure patient is getting arnesp with dialysis -Please make  sure patient follows up with nephrologist   *Acute hypoxic respiratory failure -Please ensure patient is not missing dialysis sessions -Please educate patient on the importance of avoiding salt in his diet  *Aortic atherosclerosis, cardiomegaly -Please follow-up    2.  Labs / imaging needed at time of follow-up: CBC 7-10 days, BMP 7-10 days  3.  Pending labs/ test needing follow-up: Urinalysis, repeat troponin blood test, lab work such as hepatic function panel, differential,  technologist Neer, troponin, phosphorus, and albumin.  4.  Medication Changes  STOPPED  -none     ADDED  -none     MODIFIED  -none     Follow-up Appointments: Please follow-up with family doctor in 7 to 10 days or sooner Please follow-up with cardiologist in 7 to 10 days or sooner Please follow-up with nephrologist in 7 to 10 days or sooner Please attend dialysis sessions 3 days a week, the next one will be July 12th  Hospital Course by problem list: #Acute hypoxic respiratory failure Patient presented to emergency department with shortness of breath and chest pain.  He received a chest x-ray which showed vascular contrast congestion, trace pleural fluid, favors acute interstitial edema with prior atelectasis.  He received dialysis where they removed half liters of fluid.  After dialysis, his respiratory status improved and he was saturating well on room air.  Patient was educated on the importance of avoiding salt in his diet along with hidden salts.  Patient was also educated on the importance of taking his medications appropriately and not missing a dialysis session.  #Chest pain Patient presented with chest pain that was suspicious for musculoskeletal involvement however given his history of ACS and recovered heart failure, he received a an EKG that was stable compared to his previous EKG in June 2024.  He did have elevated troponins and cardiology was consulted but due to the finding anemia and ESRD, cardiology did not believe that his chest pain was due to ACS.  Follow-up on lab work that was not completed at the time discharge.  # Normocytic anemia Patient presented to emergency department with a hemoglobin of 6.7 without overt signs of bleeding.  He received 1 unit of blood and his hemoglobin improved to 9.0 which is consistent with hemodilution along with anemia of chronic disease such as his end-stage renal disease.  Previous CT images, he was noted to have a right inguinal  hematoma.  Cardiology was consulted to evaluate for possible bleeding in the right hematoma, but due to the lack of a  pseudoaneurysm cardiology recommends conservative management.    Discharge Subjective:  Today, the patient stated that he feels great after receiving his blood 1 unit of blood transfusion last night.  He also reported resolution in symptoms of chest pain and shortness of breath.  He is aware of the right inguinal hematoma and aware that the cardiologist is in a stop by and take a look at it.  Patient did report that he thinks that the hematoma has decreased in size.   Discharge Exam:   Blood pressure (!) 150/77, pulse 66, temperature 98.4 F (36.9 C), temperature source Oral, resp. rate 16, height 6\' 1"  (1.854 m), weight 99.9 kg, SpO2 98%.  Constitutional:Non ill-appearing comfortably laying in bed, in no acute distress Cardiovascular: regular rate and rhythm, no m/r/g, chest wall tenderness to palpation left-sided inferior to nipple Pulmonary/Chest: normal work of breathing on room air, lungs clear to auscultation bilaterally.  No crackles  Abdominal: soft, non-tender, non-distended, right  inguinal hematoma present Neurological: alert & oriented x 3 MSK: no gross abnormalities. No pitting edema Skin: warm and dry Psych: Normal mood and affect  Pertinent Labs, Studies, and Procedures:     Latest Ref Rng & Units 01/04/2023    5:03 AM 01/03/2023    5:43 AM 12/22/2022    4:23 AM  CBC  WBC 4.0 - 10.5 K/uL 6.6  6.5  9.0   Hemoglobin 13.0 - 17.0 g/dL 9.0  6.7  8.6   Hematocrit 39.0 - 52.0 % 27.4  21.0  25.8   Platelets 150 - 400 K/uL 203  210  285        Latest Ref Rng & Units 01/04/2023    5:03 AM 01/03/2023    7:24 AM 12/22/2022    4:23 AM  CMP  Glucose 70 - 99 mg/dL 914  782  956   BUN 6 - 20 mg/dL 36  46  49   Creatinine 0.61 - 1.24 mg/dL 21.30  86.57  84.69   Sodium 135 - 145 mmol/L 138  140  133   Potassium 3.5 - 5.1 mmol/L 4.1  3.9  4.3   Chloride 98 - 111  mmol/L 92  95  93   CO2 22 - 32 mmol/L 30  27  24    Calcium 8.9 - 10.3 mg/dL 9.0  8.8  8.9   Total Protein 6.5 - 8.1 g/dL   8.1   Total Bilirubin 0.3 - 1.2 mg/dL   0.9   Alkaline Phos 38 - 126 U/L   51   AST 15 - 41 U/L   24   ALT 0 - 44 U/L   14     VAS [place] LOWER EXTREMITY ARTERIAL DUPLEX (ONLY [facility] & [facility])  Result Date: 01/03/2023 LOWER EXTREMITY ARTERIAL DUPLEX STUDY Patient Name:  Corey Park  Date of Exam:   01/03/2023 Medical Rec #: [id number]        Accession #:    [id number] Date of Birth: 12-25-1970        Patient Gender: M Patient Age:   34 years Exam Location:  [facility] Procedure:      VAS [place] LOWER EXTREMITY ARTERIAL DUPLEX Referring Phys: JULIE HAVILAND --------------------------------------------------------------------------------  Indications: Pain and swelling in right groing following s/p heart cath on              12/13/22.  Performing Technologist: Marilynne Halsted RDMS, RVT  Examination Guidelines: A complete evaluation includes B-mode imaging, spectral Doppler, color Doppler, and power Doppler as needed of all accessible portions of each vessel. Bilateral testing is considered an integral part of a complete examination. Limited examinations for reoccurring indications may be performed as noted.  +----------+--------+-----+--------+--------+--------+ RIGHT     PSV cm/sRatioStenosisWaveformComments +----------+--------+-----+--------+--------+--------+ CFA [id number]                                   +----------+--------+-----+--------+--------+--------+ SFA Prox  123                                   +----------+--------+-----+--------+--------+--------+  Summary: Right: No evidence of pseudoaneurysm or AVF in the right groin. A large hematoma was noted measuring 6.9 x 5.4 x 3.3 cm.  See table(s) above for measurements and observations. Electronically signed by [name] on 01/03/2023 at 5:37:56 PM.    Final    DG  Chest 2 View  Result Date:  01/03/2023 CLINICAL DATA:  52 year old male with chest pain. EXAM: CHEST - 2 VIEW COMPARISON:  Portable chest 12/22/2022 and earlier. FINDINGS: Upright AP and lateral views at 0612 hours today. Continued low lung volumes. Stable cardiomegaly. Other mediastinal contours are within normal limits. Visualized tracheal air column is within normal limits. Bilateral lung base hypo ventilation. And pulmonary vascularity also appears increased. Some fluid in the pleural fissures, but no layering pleural effusion. No pneumothorax. No consolidation identified. No acute osseous abnormality identified. Abdominal Calcified aortic atherosclerosis. Negative visible bowel gas. IMPRESSION: 1. Low lung volumes with vascular congestion, trace pleural fluid, and worsening bibasilar ventilation. Favor acute interstitial edema with atelectasis. 2. Aortic Atherosclerosis (ICD10-I70.0).  Cardiomegaly. Electronically Signed   By: [name] M.D.   On: 01/03/2023 06:22     Discharge Instructions: Discharge Instructions     (HEART FAILURE PATIENTS) Call MD:  Anytime you have any of the following symptoms: 1) 3 pound weight gain in 24 hours or 5 pounds in 1 week 2) shortness of breath, with or without a dry hacking cough 3) swelling in the hands, feet or stomach 4) if you have to sleep on extra pillows at night in order to breathe.   Complete by: As directed    Call MD for:  difficulty breathing, headache or visual disturbances   Complete by: As directed    Call MD for:  extreme fatigue   Complete by: As directed    Call MD for:  hives   Complete by: As directed    Call MD for:  persistant dizziness or light-headedness   Complete by: As directed    Call MD for:  persistant nausea and vomiting   Complete by: As directed    Call MD for:  redness, tenderness, or signs of infection (pain, swelling, redness, odor or green/yellow discharge around incision site)   Complete by: As directed    Call MD for:  severe uncontrolled pain    Complete by: As directed    Call MD for:  temperature >100.4   Complete by: As directed    Diet - low sodium heart healthy   Complete by: As directed    Discharge instructions   Complete by: As directed    You came into the emergency department with difficulty breathing and chest pain and were diagnosed with anemia, acute hypoxic respiratory failure, and musculoskeletal pain. We treated you with removal of fluid with dialysis and blood transfusion.   *For your chest pain: -The chest pain seems to be from inflammation of your muscle or ribs -Please take acetaminophen (Tylenol) as needed for pain: do not take more than  -Please see your heart doctor within the next 7 to 10 days or sooner if needed -Continue to take medications for your heart prescribed by your doctors  *For your difficulty breathing: -Please do not miss any dialysis sessions if possible -NEXT dialysis session is January 05, 2023 -Avoid salty foods such as pot roast, cold cuts from daily, prepackaged foods with salt/sodium in them -Please avoid using salt while cooking food -Continue to take medications for your heart prescribed by your doctors  *For your anemia: -You received a blood transfusion -Please get arnacept (Darbepoetin Alfa at dialysis  -If you feel dizzy, lightheaded, sob, and have palpitations; please go to an emergency department -Please avoid taking medications such as ibuprofen or Advil -If your son notices blood in your stool or urine, please let your family doctor  know -Please follow-up with your family doctor for when you should be due for your next colonoscopy  Please take your home medications as previously prescribed by your doctors  Please visit your family doctor in 7-10 days or sooner if needed Please visit your cardiology (heart doctor) in 7-10 days or sooner if needed   If you experience any of the following symptoms, please go to the emergency department: -Chest pain -Shortness of  breath -Dizziness -Syncope (passing out) -Fever -Chills -Feeling an increase in size of the hematoma (bruise) in your right groin   It was a pleasure to care for you during your hospitalization, we are glad you are feeling better!   Faith Rogue DO   Increase activity slowly   Complete by: As directed        Signed: Faith Rogue DO [facility] - PGY1 Pager: [phone] 01/04/2023, 3:54 PM    Please contact the on call pager after 5 pm and on weekends at [phone].

## 2023-01-04 NOTE — Progress Notes (Addendum)
Pt receives out-pt HD at [facility] on MWF. Pt arrives at 4:00 pm for 4:20 chair time. Spoke to clinic staff who state pt can resume tomorrow if pt is d/c later today or tomorrow am. Will assist as needed.   Olivia Canter Renal Navigator [phone]  Addendum at 2:35 pm: Case discussed with Renal NP regarding pt having a 3rd shift chair for out-pt HD. In the event HD is ordered for inpt for tomorrow, navigator requested that pt be placed on 2nd shift in case pt can be d/c to home in the am and can go to out-pt HD at normal time.   Addendum at 4:12 pm: D/C order noted. Contacted [facility] to advise clinic of pt's d/c today and that pt should resume care tomorrow on normal schedule.

## 2023-01-04 NOTE — ED Notes (Signed)
Pt notified NT that he had to have BM and requested to ambulate to the bathroom. This RN educated pt on the safety concerns and suggest BSC or bedpan. Pt proceeded to be verbally aggressive towards this RN stating "ill do what the fuck I want to do. Get out! Fuck you both!" This RN attempted to deescalate the situation. Pt continued to use aggressive language towards staff.

## 2023-01-04 NOTE — Discharge Planning (Addendum)
[facility] Patient Discharge Orders- [facility] CLINIC: [facility]  Patient's name: Corey Park Admit/DC Dates: 01/03/2023 - 01/04/2023  Discharge Diagnoses: Atypical Chest Pain  Symptomatic Anemia  Aranesp: Given: Yes   Date and amount of last dose: 100 mcg SQ 01/03/2023  Last Hgb: 9.2 PRBC's Given: Yes Date/# of units: 1 units 01/03/2023 ESA dose for discharge: mircera 200 mcg IV q 2 weeks  IV Iron dose at discharge: per protocol  Heparin change: No  EDW Change: No New EDW: NA  Bath Change: No  Access intervention/Change: No Details:  Hectorol/Calcitriol change: No  Discharge Labs: Calcium 9.0 Phosphorus NA Albumin 3.3 K+ 4.1  IV Antibiotics: NA Details:  On Coumadin?: NA Last INR: Next INR: Managed By:   OTHER/APPTS/LAB ORDERS:    D/C Meds to be reconciled by nurse after every discharge.  Completed By: Alonna Buckler [facility] [phone]    Reviewed by: MD:______ RN_______

## 2023-01-04 NOTE — Evaluation (Addendum)
Occupational Therapy Evaluation Patient Details Name: Courtland Reas MRN: [id number] DOB: January 09, 1971 Today's Date: 01/04/2023   History of Present Illness 52 yo M adm for CP.  PMH includes: T2DM, ESRD on HD, GERD, HLD, legally blind.   Clinical Impression   Patient admitted for the diagnosis above.  Currently he is not expressing any chest pain or discomfort, and is up and walking the halls with CGA due to low vision and new environment.  Patient appears to be at his baseline for mobility and ADL completion, and although he lives alone, his son checks on him, and he often stays at his SO home.  No acute OT needs have been identified, and PT can screen the patient if needed.  No post acute recommendations for therapy/OT.      Recommendations for follow up therapy are one component of a multi-disciplinary discharge planning process, led by the attending physician.  Recommendations may be updated based on patient status, additional functional criteria and insurance authorization.   Assistance Recommended at Discharge Intermittent Supervision/Assistance  Patient can return home with the following Assist for transportation;Assistance with cooking/housework    Functional Status Assessment  Patient has not had a recent decline in their functional status  Equipment Recommendations  None recommended by OT    Recommendations for Other Services       Precautions / Restrictions Precautions Precautions: Fall Restrictions Weight Bearing Restrictions: No      Mobility Bed Mobility Overal bed mobility: Modified Independent                  Transfers Overall transfer level: Needs assistance Equipment used: 1 person hand held assist Transfers: Sit to/from Stand, Bed to chair/wheelchair/BSC Sit to Stand: Supervision     Step pivot transfers: Min guard     General transfer comment: new environment      Balance Overall balance assessment: Mild deficits observed, not formally  tested                                         ADL either performed or assessed with clinical judgement   ADL Overall ADL's : At baseline                                       General ADL Comments: Min Guard for guidence in new environment     Vision Baseline Vision/History: 2 Legally blind Additional Comments: sees shadows     Perception     Praxis      Pertinent Vitals/Pain Pain Assessment Pain Assessment: No/denies pain     Hand Dominance Right   Extremity/Trunk Assessment Upper Extremity Assessment Upper Extremity Assessment: Overall WFL for tasks assessed   Lower Extremity Assessment Lower Extremity Assessment: Overall WFL for tasks assessed   Cervical / Trunk Assessment Cervical / Trunk Assessment: Normal   Communication Communication Communication: No difficulties   Cognition Arousal/Alertness: Awake/alert Behavior During Therapy: WFL for tasks assessed/performed Overall Cognitive Status: Within Functional Limits for tasks assessed                                                        Home  Living Family/patient expects to be discharged to:: Private residence Living Arrangements: Children Available Help at Discharge: Family;Available PRN/intermittently Type of Home: Apartment Home Access: Stairs to enter Entrance Stairs-Number of Steps: 6 Entrance Stairs-Rails: Left Home Layout: One level     Bathroom Shower/Tub: Sponge bathes at baseline   Bathroom Toilet: Standard Bathroom Accessibility: Yes   Home Equipment: None   Additional Comments: son can check on pt as needed      Prior Functioning/Environment Prior Level of Function : Independent/Modified Independent             Mobility Comments: independent, uses SCAT for transportation ADLs Comments: basin bathing at baseline, independent IADLs (family provides meals)        OT Problem List: Decreased safety awareness       OT Treatment/Interventions:      OT Goals(Current goals can be found in the care plan section) Acute Rehab OT Goals Patient Stated Goal: Return home OT Goal Formulation: With patient Time For Goal Achievement: 01/08/23 Potential to Achieve Goals: Good  OT Frequency:      Co-evaluation              AM-PAC OT "6 Clicks" Daily Activity     Outcome Measure Help from another person eating meals?: None Help from another person taking care of personal grooming?: None Help from another person toileting, which includes using toliet, bedpan, or urinal?: A Little Help from another person bathing (including washing, rinsing, drying)?: A Little Help from another person to put on and taking off regular upper body clothing?: None Help from another person to put on and taking off regular lower body clothing?: A Little 6 Click Score: 21   End of Session Nurse Communication: Mobility status  Activity Tolerance: Patient tolerated treatment well Patient left: in bed;with call bell/phone within reach  OT Visit Diagnosis: Unsteadiness on feet (R26.81);Low vision, both eyes (H54.2)                Time: 1042-1100 OT Time Calculation (min): 18 min Charges:  OT General Charges $OT Visit: 1 Visit OT Evaluation $OT Eval Moderate Complexity: 1 Mod  01/04/2023  RP, OTR/L  Acute Rehabilitation Services  Office:  [phone]   Suzanna Obey 01/04/2023, 11:05 AM

## 2023-01-04 NOTE — ED Notes (Signed)
Pt is blind. Prepared his breakfast for him. Pt currently eating with no N/V.

## 2023-01-06 ENCOUNTER — Telehealth: Payer: Self-pay | Admitting: Nephrology

## 2023-01-06 NOTE — Telephone Encounter (Signed)
Transition of Care - Initial Contact from Inpatient Facility  Date of discharge:  Date of contact:   Method: Phone Spoke to: Patient  Patient contacted to discuss transition of care from recent inpatient hospitalization. Patient was admitted to [facility] from 7/10-7/11/24. with discharge diagnosis of Chest pain/ Acute Respiratory Failure with volume overload /anemia?esrd...  The discharge medication list was reviewed. Patient understands the changes and has no concerns.   Patient will return to his/her outpatient HD unit on: Monday  and we discussed need for lower edw  to 100..0 kg  with wt loss recently   No other concerns at this time.

## 2023-01-16 ENCOUNTER — Other Ambulatory Visit: Payer: Self-pay

## 2023-01-16 ENCOUNTER — Encounter (HOSPITAL_COMMUNITY): Payer: Self-pay | Admitting: Emergency Medicine

## 2023-01-16 ENCOUNTER — Telehealth: Payer: Self-pay

## 2023-01-16 ENCOUNTER — Emergency Department (HOSPITAL_COMMUNITY): Payer: Medicare Other

## 2023-01-16 ENCOUNTER — Emergency Department (HOSPITAL_COMMUNITY)
Admission: EM | Admit: 2023-01-16 | Discharge: 2023-01-16 | Disposition: A | Discharge status: Home / Self Care | Payer: Medicare Other | Attending: Emergency Medicine | Admitting: Emergency Medicine

## 2023-01-16 DIAGNOSIS — R079 Chest pain, unspecified: Secondary | ICD-10-CM | POA: Diagnosis present

## 2023-01-16 DIAGNOSIS — N186 End stage renal disease: Secondary | ICD-10-CM | POA: Diagnosis not present

## 2023-01-16 DIAGNOSIS — Z79899 Other long term (current) drug therapy: Secondary | ICD-10-CM | POA: Insufficient documentation

## 2023-01-16 DIAGNOSIS — I132 Hypertensive heart and chronic kidney disease with heart failure and with stage 5 chronic kidney disease, or end stage renal disease: Secondary | ICD-10-CM | POA: Diagnosis not present

## 2023-01-16 DIAGNOSIS — E1122 Type 2 diabetes mellitus with diabetic chronic kidney disease: Secondary | ICD-10-CM | POA: Insufficient documentation

## 2023-01-16 DIAGNOSIS — R0789 Other chest pain: Secondary | ICD-10-CM | POA: Diagnosis not present

## 2023-01-16 DIAGNOSIS — I509 Heart failure, unspecified: Secondary | ICD-10-CM | POA: Diagnosis not present

## 2023-01-16 DIAGNOSIS — Z992 Dependence on renal dialysis: Secondary | ICD-10-CM | POA: Insufficient documentation

## 2023-01-16 DIAGNOSIS — Z7982 Long term (current) use of aspirin: Secondary | ICD-10-CM | POA: Insufficient documentation

## 2023-01-16 DIAGNOSIS — J45909 Unspecified asthma, uncomplicated: Secondary | ICD-10-CM | POA: Insufficient documentation

## 2023-01-16 LAB — CBC
HCT: 30.9 % — ABNORMAL LOW (ref 39.0–52.0)
Hemoglobin: 9.8 g/dL — ABNORMAL LOW (ref 13.0–17.0)
MCH: 31.3 pg (ref 26.0–34.0)
MCHC: 31.7 g/dL (ref 30.0–36.0)
MCV: 98.7 fL (ref 80.0–100.0)
Platelets: 146 10*3/uL — ABNORMAL LOW (ref 150–400)
RBC: 3.13 MIL/uL — ABNORMAL LOW (ref 4.22–5.81)
RDW: 18.2 % — ABNORMAL HIGH (ref 11.5–15.5)
WBC: 5.9 10*3/uL (ref 4.0–10.5)
nRBC: 0.3 % — ABNORMAL HIGH (ref 0.0–0.2)

## 2023-01-16 LAB — BASIC METABOLIC PANEL
Anion gap: 14 (ref 5–15)
BUN: 32 mg/dL — ABNORMAL HIGH (ref 6–20)
CO2: 31 mmol/L (ref 22–32)
Calcium: 9 mg/dL (ref 8.9–10.3)
Chloride: 92 mmol/L — ABNORMAL LOW (ref 98–111)
Creatinine, Ser: 11.55 mg/dL — ABNORMAL HIGH (ref 0.61–1.24)
GFR, Estimated: 5 mL/min — ABNORMAL LOW (ref >60)
Glucose, Bld: 99 mg/dL (ref 70–99)
Potassium: 3.9 mmol/L (ref 3.5–5.1)
Sodium: 137 mmol/L (ref 135–145)

## 2023-01-16 LAB — TROPONIN I (HIGH SENSITIVITY): Troponin I (High Sensitivity): 70 ng/L — ABNORMAL HIGH (ref <18)

## 2023-01-16 MED ORDER — ALUM & MAG HYDROXIDE-SIMETH 200-200-20 MG/5ML PO SUSP
30.0000 mL | Freq: Once | ORAL | Status: AC
  Administered 2023-01-16: 30 mL via ORAL
  Filled 2023-01-16: qty 30

## 2023-01-16 MED ORDER — PANTOPRAZOLE SODIUM 40 MG IV SOLR
40.0000 mg | Freq: Once | INTRAVENOUS | Status: AC
  Administered 2023-01-16: 40 mg via INTRAVENOUS
  Filled 2023-01-16: qty 10

## 2023-01-16 MED ORDER — LIDOCAINE VISCOUS HCL 2 % MT SOLN
15.0000 mL | Freq: Once | OROMUCOSAL | Status: AC
  Administered 2023-01-16: 15 mL via ORAL
  Filled 2023-01-16: qty 15

## 2023-01-16 MED ORDER — PROCHLORPERAZINE EDISYLATE 10 MG/2ML IJ SOLN
10.0000 mg | Freq: Once | INTRAMUSCULAR | Status: AC
  Administered 2023-01-16: 10 mg via INTRAVENOUS
  Filled 2023-01-16: qty 2

## 2023-01-16 MED ORDER — DIPHENHYDRAMINE HCL 50 MG/ML IJ SOLN
25.0000 mg | Freq: Once | INTRAMUSCULAR | Status: AC
  Administered 2023-01-16: 25 mg via INTRAVENOUS
  Filled 2023-01-16: qty 1

## 2023-01-16 NOTE — ED Provider Notes (Signed)
[place] [facility]MENT AT [facility] Provider Note   CSN: [id number] Arrival date & time: 01/16/23  1127     History  Chief Complaint  Patient presents with   Chest Pain    Corey Park is a 52 y.o. male.  52 year old male with past medical history significant for ESRD on dialysis who presents today with concern of chest pain.  He states he gets these chest pain episodes almost weekly.  Feels similar to his previous episode.  Does endorse some shortness of breath as well.  Pain does not radiate anywhere.  He was recently admitted for an NSTEMI.  Also reports hiccups.  States these are chronic.  Last about 12 days.  He states this episodes been ongoing now for about 7 or 8 days. No issue with p.o. intake .  The history is provided by the patient. No language interpreter was used.       Home Medications Prior to Admission medications   Medication Sig Start Date End Date Taking? Authorizing Provider  acetaminophen (TYLENOL) 325 MG tablet Take 2 tablets (650 mg total) by mouth every 6 (six) hours as needed for up to 30 doses for moderate pain or mild pain. 12/22/22   Terald Sleeper, MD  aspirin EC 81 MG tablet Take 1 tablet (81 mg total) by mouth daily. Swallow whole. 12/14/22   Modena Slater, DO  atorvastatin (LIPITOR) 80 MG tablet Take 1 tablet (80 mg total) by mouth daily. 12/15/22   Modena Slater, DO  calcium acetate (PHOSLO) 667 MG capsule Take 2 capsules (1,334 mg total) by mouth with breakfast, with lunch, and with evening meal. 12/14/22   Modena Slater, DO  carvedilol (COREG) 25 MG tablet Take 1 tablet (25 mg total) by mouth 2 (two) times daily with a meal. 12/14/22   Modena Slater, DO  diclofenac Sodium (VOLTAREN) 1 % GEL Apply 2 g topically 4 (four) times daily as needed (Pain). 10/20/22   Tiffany Kocher, DO  ferric citrate (AURYXIA) 1 GM 210 MG(Fe) tablet Take 2 tablets (420 mg total) by mouth daily. 12/14/22   Modena Slater, DO  gabapentin (NEURONTIN) 100 MG  capsule Take 1 capsule (100 mg total) by mouth 3 (three) times daily. 12/14/22   Modena Slater, DO  isosorbide mononitrate (IMDUR) 30 MG 24 hr tablet Take 30 mg by mouth daily. 12/20/22 01/19/23  [provider]  Multiple Vitamins-Minerals (CERTAVITE/ANTIOXIDANTS) TABS Take 1 tablet by mouth at bedtime. 10/20/22   Tiffany Kocher, DO  multivitamin (RENA-VIT) TABS tablet Take 1 tablet by mouth daily. 11/22/22   [provider]  nitroGLYCERIN (NITROSTAT) 0.4 MG SL tablet Place 0.4 mg under the tongue every 5 (five) minutes as needed for chest pain. 12/19/22   [provider]  olmesartan (BENICAR) 20 MG tablet Take 1 tablet (20 mg total) by mouth daily. 12/14/22   Modena Slater, DO  ondansetron (ZOFRAN-ODT) 4 MG disintegrating tablet Take 1 tablet (4 mg total) by mouth every 8 (eight) hours as needed for up to 12 doses for nausea or vomiting. 12/22/22   Trifan, Kermit Balo, MD  oxyCODONE (ROXICODONE) 5 MG immediate release tablet Take 1 tablet (5 mg total) by mouth every 12 (twelve) hours as needed for up to 12 doses for severe pain. 12/22/22   Terald Sleeper, MD  pantoprazole (PROTONIX) 40 MG tablet Take 1 tablet (40 mg total) by mouth daily. 12/14/22 01/13/23  Modena Slater, DO      Allergies    Patient has  no known allergies.    Review of Systems   Review of Systems  Constitutional:  Negative for chills and fever.  Respiratory:  Positive for shortness of breath. Negative for cough.   Cardiovascular:  Positive for chest pain.  Gastrointestinal:  Negative for abdominal pain, nausea and vomiting.  Genitourinary:  Negative for difficulty urinating.  Neurological:  Negative for light-headedness.  All other systems reviewed and are negative.   Physical Exam Updated Vital Signs BP (!) 140/83   Pulse 70   Temp 98.3 F (36.8 C) (Oral)   Resp 15   SpO2 99%  Physical Exam Vitals and nursing note reviewed.  Constitutional:      General: He is not in acute distress.    Appearance:  Normal appearance. He is not ill-appearing.  HENT:     Head: Normocephalic and atraumatic.     Nose: Nose normal.  Eyes:     General: No scleral icterus.    Extraocular Movements: Extraocular movements intact.     Conjunctiva/sclera: Conjunctivae normal.  Cardiovascular:     Rate and Rhythm: Normal rate and regular rhythm.     Heart sounds: Normal heart sounds.  Pulmonary:     Effort: Pulmonary effort is normal. No respiratory distress.     Breath sounds: Normal breath sounds. No wheezing or rales.  Abdominal:     General: There is no distension.     Tenderness: There is no abdominal tenderness. There is no guarding.  Musculoskeletal:        General: Normal range of motion.     Cervical back: Normal range of motion.  Skin:    General: Skin is warm and dry.  Neurological:     General: No focal deficit present.     Mental Status: He is alert. Mental status is at baseline.     ED Results / Procedures / Treatments   Labs (all labs ordered are listed, but only abnormal results are displayed) Labs Reviewed  BASIC METABOLIC PANEL - Abnormal; Notable for the following components:      Result Value   Chloride 92 (*)    BUN 32 (*)    Creatinine, Ser 11.55 (*)    GFR, Estimated 5 (*)    All other components within normal limits  CBC - Abnormal; Notable for the following components:   RBC 3.13 (*)    Hemoglobin 9.8 (*)    HCT 30.9 (*)    RDW 18.2 (*)    Platelets 146 (*)    nRBC 0.3 (*)    All other components within normal limits  TROPONIN I (HIGH SENSITIVITY) - Abnormal; Notable for the following components:   Troponin I (High Sensitivity) 70 (*)    All other components within normal limits    EKG EKG Interpretation Date/Time:  Tuesday January 16 2023 11:36:21 EDT Ventricular Rate:  75 PR Interval:  176 QRS Duration:  110 QT Interval:  447 QTC Calculation: 500 R Axis:   -17  Text Interpretation: Sinus rhythm RSR' in V1 or V2, probably normal variant Probable LVH with  secondary repol abnrm Anterior ST elevation, probably due to LVH Borderline prolonged QT interval similar to previous Confirmed by Arby Barrette [id number]) on 01/16/2023 11:39:55 AM  Radiology DG Chest 2 View  Result Date: 01/16/2023 CLINICAL DATA:  chest pain EXAM: CHEST - 2 VIEW COMPARISON:  01/03/2023. FINDINGS: Low lung volume. Redemonstration of left retrocardiac airspace opacity obscuring the left lateral hemidiaphragm and blunting the left lateral costophrenic angle suggesting combination  of left lower lobe atelectasis and/or consolidation with pleural effusion. No significant interval change. There is trace right pleural effusion with probable atelectatic changes at the right lung base. Bilateral hilar peribronchial cuffing noted. Stable enlargement of cardio-mediastinal silhouette. No acute osseous abnormalities. The soft tissues are within normal limits. IMPRESSION: *Persistent bilateral pleural effusions, left greater than right, with probable underlying compressive atelectatic changes. No significant interval change. Electronically Signed   By: Jules Schick M.D.   On: 01/16/2023 13:12    Procedures Procedures    Medications Ordered in ED Medications  alum & mag hydroxide-simeth (MAALOX/MYLANTA) 200-200-20 MG/5ML suspension 30 mL (has no administration in time range)    And  lidocaine (XYLOCAINE) 2 % viscous mouth solution 15 mL (has no administration in time range)  pantoprazole (PROTONIX) injection 40 mg (has no administration in time range)  prochlorperazine (COMPAZINE) injection 10 mg (has no administration in time range)  diphenhydrAMINE (BENADRYL) injection 25 mg (has no administration in time range)    ED Course/ Medical Decision Making/ A&P                             Medical Decision Making Amount and/or Complexity of Data Reviewed Labs: ordered. Radiology: ordered.  Risk OTC drugs. Prescription drug management.   Medical Decision Making / ED Course   This  patient presents to the ED for concern of chest pain, this involves an extensive number of treatment options, and is a complaint that carries with it a high risk of complications and morbidity.  The differential diagnosis includes ACS, PE, pneumonia, dissection, GERD  MDM: 52 year old male with past medical significant for ESRD on Monday, Wednesday, Friday schedule who has been compliant with his dialysis sessions presents today for concern of chest pain.  States he gets these episodes weekly and there is nothing different about this episode.  Some shortness of breath as well.  No signs of volume overload on exam.  Initially was put on supplemental O2 for comfort.  I took patient off of the supplemental O2 and he maintained O2 sats of upper 90s.   CBC is without leukocytosis.  Hemoglobin at 9.8 which is uptrending.  BMP with creatinine of 11.5 which is around his baseline.  No other acute electrolyte derangement.  Troponin of 70 which is downtrending from recent.  He also chronically has troponin elevation due to ESRD.  Do not feel this is ACS given 3-day duration of symptoms.  No need to repeat.  Chest x-ray without acute cardiopulmonary process.  EKG without acute ischemic changes.  Patient is appropriate for discharge.  Cardiology referral placed.  Patient discharged in stable condition.  Return precautions discussed.  Patient voices understanding and is in agreement with the plan.  Additional history obtained: -Additional history obtained from chart review -External records from outside source obtained and reviewed including: Chart review including previous notes, labs, imaging, consultation notes   Lab Tests: -I ordered, reviewed, and interpreted labs.   The pertinent results include:   Labs Reviewed  BASIC METABOLIC PANEL - Abnormal; Notable for the following components:      Result Value   Chloride 92 (*)    BUN 32 (*)    Creatinine, Ser 11.55 (*)    GFR, Estimated 5 (*)    All other  components within normal limits  CBC - Abnormal; Notable for the following components:   RBC 3.13 (*)    Hemoglobin 9.8 (*)  HCT 30.9 (*)    RDW 18.2 (*)    Platelets 146 (*)    nRBC 0.3 (*)    All other components within normal limits  TROPONIN I (HIGH SENSITIVITY) - Abnormal; Notable for the following components:   Troponin I (High Sensitivity) 70 (*)    All other components within normal limits      EKG  EKG Interpretation Date/Time:  Tuesday January 16 2023 11:36:21 EDT Ventricular Rate:  75 PR Interval:  176 QRS Duration:  110 QT Interval:  447 QTC Calculation: 500 R Axis:   -17  Text Interpretation: Sinus rhythm RSR' in V1 or V2, probably normal variant Probable LVH with secondary repol abnrm Anterior ST elevation, probably due to LVH Borderline prolonged QT interval similar to previous Confirmed by Arby Barrette [phone]) on 01/16/2023 11:39:55 AM         Imaging Studies ordered: I ordered imaging studies including chest x-ray I independently visualized and interpreted imaging. I agree with the radiologist interpretation   Medicines ordered and prescription drug management: Meds ordered this encounter  Medications   AND Linked Order Group    alum & mag hydroxide-simeth (MAALOX/MYLANTA) 200-200-20 MG/5ML suspension 30 mL    lidocaine (XYLOCAINE) 2 % viscous mouth solution 15 mL   pantoprazole (PROTONIX) injection 40 mg   prochlorperazine (COMPAZINE) injection 10 mg   diphenhydrAMINE (BENADRYL) injection 25 mg    -I have reviewed the patients home medicines and have made adjustments as needed   Reevaluation: After the interventions noted above, I reevaluated the patient and found that they have :improved  Co morbidities that complicate the patient evaluation  Past Medical History:  Diagnosis Date   Allergy, unspecified, initial encounter 08/25/2019   Asthma    as a child   Cellulitis, perineum 11/26/2019   CHF (congestive heart failure) (HCC) 2021    Complication of vascular dialysis catheter 08/25/2019   COVID-19 virus infection 07/27/2019   Diabetes mellitus without complication (HCC)    type 2   Dilated cardiomyopathy (HCC) 07/03/2018   ESRD on hemodialysis (HCC) 06/17/2018   MWF at [facility]   Fe deficiency anemia 12/23/2018   Hypertension    Legally blind    B/L   Pneumonia 2021   Type 2 diabetes mellitus with diabetic peripheral angiopathy without gangrene (HCC) 08/25/2019   Unspecified protein-calorie malnutrition (HCC) 08/25/2019      Dispostion: Patient discharged in stable condition.  Return precaution discussed.  Patient voices understanding and is in agreement with plan.  Final Clinical Impression(s) / ED Diagnoses Final diagnoses:  Atypical chest pain    Rx / DC Orders ED Discharge Orders          Ordered    Ambulatory referral to Cardiology       Comments: If you have not heard from the Cardiology office within the next 72 hours please call [phone].   01/16/23 1433              Marita Kansas, PA-C 01/16/23 1436    Arby Barrette, MD 01/19/23 [phone]

## 2023-01-16 NOTE — Patient Outreach (Signed)
  Care Coordination   01/16/2023 Name: Corey Park MRN: [id number] DOB: 07-17-1970   Care Coordination Outreach Attempts:  An unsuccessful telephone outreach was attempted today to offer the patient information about available care coordination services.  Follow Up Plan:  Additional outreach attempts will be made to offer the patient care coordination information and services.   Encounter Outcome:  No Answer   Care Coordination Interventions:  No, not indicated    Juanell Fairly RN, BSN, [facility] Care Coordinator [facility]   Phone: [phone]

## 2023-01-16 NOTE — ED Triage Notes (Signed)
Pt BIB from work for CP.  Pt has had CP on and off for years but got worse this AM.  Pt also has chronically intermittent hiccups.  Pt complains of a burning sensation to his central chest, not radiating anywhere.  Pt is a MWF dialysis pt and is current on his tx.   EMS gave 324 ASA, 1 nitroglycerin, 4 zofran with no relief of symptoms.   172/92 96% RA, 80

## 2023-01-16 NOTE — Discharge Instructions (Addendum)
Your workup today is reassuring.  No concerning cause of your chest pain noted on workup today.  Please follow-up with your cardiologist.  If you have any concerning symptoms return to the emergency room.

## 2023-01-16 NOTE — ED Notes (Signed)
DC instructions  reviewed with pt.  Pt verbalized understanding. Pt DC. [facility] called for transport. Voucher provided.

## 2023-01-16 NOTE — ED Notes (Signed)
Pt complaining of some heartburn as well.

## 2023-01-16 NOTE — ED Notes (Signed)
Pt drops sats into 70's & 80's when sleeping. PT placed on 2L for support.

## 2023-01-22 ENCOUNTER — Telehealth: Payer: Self-pay

## 2023-01-22 ENCOUNTER — Telehealth: Payer: Self-pay | Admitting: *Deleted

## 2023-01-22 NOTE — Telephone Encounter (Signed)
Transition Care Management Unsuccessful Follow-up Telephone Call  Date of discharge and from where:  01/16/2023 [facility]  Attempts:  1st Attempt  Reason for unsuccessful TCM follow-up call:  Left voice message  Kymorah Korf [facility]  [facility] Population Health Community Resource Care Guide   [email or web address]  ?? [phone]   Website: [email or web address]  [email or web address]

## 2023-01-22 NOTE — Progress Notes (Unsigned)
  Care Coordination Note  01/22/2023 Name: Corey Park MRN: [id number] DOB: 1970-10-15  Corey Park is a 52 y.o. year old male who is a primary care patient of [name], NP and is actively engaged with the care management team. I reached out to [employer] by phone today to assist with re-scheduling an initial visit with the RN Case Manager  Follow up plan: Unsuccessful telephone outreach attempt made. A HIPAA compliant phone message was left for the patient providing contact information and requesting a return call.   [facility]  Care Coordination Care Guide  Direct Dial: [phone]

## 2023-01-23 ENCOUNTER — Telehealth: Payer: Self-pay

## 2023-01-23 NOTE — Telephone Encounter (Signed)
Transition Care Management Unsuccessful Follow-up Telephone Call  Date of discharge and from where:  01/16/2023 [facility]  Attempts:  2nd Attempt  Reason for unsuccessful TCM follow-up call:  Left voice message   [facility]  [facility] Population Health Community Resource Care Guide   [email or web address]  ?? [phone]   Website: [email or web address]  [email or web address]

## 2023-01-24 NOTE — Telephone Encounter (Signed)
Noted. Schedule appointment.

## 2023-01-24 NOTE — Progress Notes (Signed)
  Care Coordination Note  01/24/2023 Name: Corey Park MRN: [id number] DOB: 1971/06/13  Corey Park is a 52 y.o. year old male who is a primary care patient of [name], NP and is actively engaged with the care management team. I reached out to [employer] by phone today to assist with re-scheduling an initial visit with the RN Case Manager  Follow up plan: Unsuccessful telephone outreach attempt made. A HIPAA compliant phone message was left for the patient providing contact information and requesting a return call.  We have been unable to make contact with the patient for follow up. The care management team is available to follow up with the patient after provider conversation with the patient regarding recommendation for care management engagement and subsequent re-referral to the care management team.   [facility] Coordination Care Guide  Direct Dial: [phone]

## 2023-01-25 ENCOUNTER — Telehealth: Payer: Self-pay | Admitting: Family

## 2023-01-25 NOTE — Telephone Encounter (Signed)
Called pt to make an appt per provider Amy Zonia Kief regarding recommendation for care management engagement and a repeat referral. Was not able to reach pt and could not leave voicemail due to voicemail box being full.

## 2023-01-25 NOTE — Telephone Encounter (Signed)
Was not able to reach patient; pt's voicemail box is full.

## 2023-01-31 NOTE — Telephone Encounter (Signed)
Called Pt left Vm to call office back to schedule appointment.

## 2023-02-02 NOTE — Telephone Encounter (Signed)
Left Vm to call office back so we can get him on schedule.

## 2023-02-09 ENCOUNTER — Emergency Department (HOSPITAL_COMMUNITY)
Admission: EM | Admit: 2023-02-09 | Discharge: 2023-02-09 | Disposition: A | Discharge status: Home / Self Care | Payer: Medicare Other | Attending: Emergency Medicine | Admitting: Emergency Medicine

## 2023-02-09 ENCOUNTER — Encounter (HOSPITAL_COMMUNITY): Payer: Self-pay

## 2023-02-09 ENCOUNTER — Other Ambulatory Visit: Payer: Self-pay

## 2023-02-09 ENCOUNTER — Emergency Department (HOSPITAL_COMMUNITY): Payer: Medicare Other

## 2023-02-09 DIAGNOSIS — E119 Type 2 diabetes mellitus without complications: Secondary | ICD-10-CM | POA: Insufficient documentation

## 2023-02-09 DIAGNOSIS — R066 Hiccough: Secondary | ICD-10-CM | POA: Diagnosis not present

## 2023-02-09 DIAGNOSIS — I10 Essential (primary) hypertension: Secondary | ICD-10-CM | POA: Diagnosis not present

## 2023-02-09 DIAGNOSIS — K219 Gastro-esophageal reflux disease without esophagitis: Secondary | ICD-10-CM | POA: Insufficient documentation

## 2023-02-09 DIAGNOSIS — R079 Chest pain, unspecified: Secondary | ICD-10-CM

## 2023-02-09 DIAGNOSIS — Z7982 Long term (current) use of aspirin: Secondary | ICD-10-CM | POA: Insufficient documentation

## 2023-02-09 DIAGNOSIS — Z79899 Other long term (current) drug therapy: Secondary | ICD-10-CM | POA: Diagnosis not present

## 2023-02-09 DIAGNOSIS — R1013 Epigastric pain: Secondary | ICD-10-CM | POA: Diagnosis present

## 2023-02-09 LAB — BASIC METABOLIC PANEL
Anion gap: 14 (ref 5–15)
BUN: 47 mg/dL — ABNORMAL HIGH (ref 6–20)
CO2: 28 mmol/L (ref 22–32)
Calcium: 9.3 mg/dL (ref 8.9–10.3)
Chloride: 91 mmol/L — ABNORMAL LOW (ref 98–111)
Creatinine, Ser: 11.62 mg/dL — ABNORMAL HIGH (ref 0.61–1.24)
GFR, Estimated: 5 mL/min — ABNORMAL LOW (ref >60)
Glucose, Bld: 219 mg/dL — ABNORMAL HIGH (ref 70–99)
Potassium: 4.8 mmol/L (ref 3.5–5.1)
Sodium: 133 mmol/L — ABNORMAL LOW (ref 135–145)

## 2023-02-09 LAB — CBC WITH DIFFERENTIAL/PLATELET
Abs Immature Granulocytes: 0.01 10*3/uL (ref 0.00–0.07)
Basophils Absolute: 0 10*3/uL (ref 0.0–0.1)
Basophils Relative: 0 %
Eosinophils Absolute: 0.2 10*3/uL (ref 0.0–0.5)
Eosinophils Relative: 3 %
HCT: 38.8 % — ABNORMAL LOW (ref 39.0–52.0)
Hemoglobin: 12.5 g/dL — ABNORMAL LOW (ref 13.0–17.0)
Immature Granulocytes: 0 %
Lymphocytes Relative: 20 %
Lymphs Abs: 1.2 10*3/uL (ref 0.7–4.0)
MCH: 31.1 pg (ref 26.0–34.0)
MCHC: 32.2 g/dL (ref 30.0–36.0)
MCV: 96.5 fL (ref 80.0–100.0)
Monocytes Absolute: 0.6 10*3/uL (ref 0.1–1.0)
Monocytes Relative: 10 %
Neutro Abs: 4 10*3/uL (ref 1.7–7.7)
Neutrophils Relative %: 67 %
Platelets: 136 10*3/uL — ABNORMAL LOW (ref 150–400)
RBC: 4.02 MIL/uL — ABNORMAL LOW (ref 4.22–5.81)
RDW: 17.4 % — ABNORMAL HIGH (ref 11.5–15.5)
WBC: 6 10*3/uL (ref 4.0–10.5)
nRBC: 0 % (ref 0.0–0.2)

## 2023-02-09 LAB — BRAIN NATRIURETIC PEPTIDE: B Natriuretic Peptide: 282.1 pg/mL — ABNORMAL HIGH (ref 0.0–100.0)

## 2023-02-09 LAB — TROPONIN I (HIGH SENSITIVITY)
Troponin I (High Sensitivity): 78 ng/L — ABNORMAL HIGH (ref <18)
Troponin I (High Sensitivity): 73 ng/L — ABNORMAL HIGH (ref <18)

## 2023-02-09 LAB — MAGNESIUM: Magnesium: 1.9 mg/dL (ref 1.7–2.4)

## 2023-02-09 MED ORDER — ALUM & MAG HYDROXIDE-SIMETH 200-200-20 MG/5ML PO SUSP
30.0000 mL | Freq: Once | ORAL | Status: AC
  Administered 2023-02-09: 30 mL via ORAL
  Filled 2023-02-09: qty 30

## 2023-02-09 MED ORDER — BISMUTH SUBSALICYLATE 262 MG/15ML PO SUSP
30.0000 mL | Freq: Once | ORAL | Status: AC
  Administered 2023-02-09: 30 mL via ORAL
  Filled 2023-02-09: qty 236

## 2023-02-09 MED ORDER — FAMOTIDINE 20 MG PO TABS: 20.0000 mg | ORAL_TABLET | Freq: Two times a day (BID) | ORAL | 0 refills | Status: DC | PRN

## 2023-02-09 MED ORDER — OMEPRAZOLE 20 MG PO CPDR: 20.0000 mg | DELAYED_RELEASE_CAPSULE | Freq: Every day | ORAL | 0 refills | Status: DC

## 2023-02-09 MED ORDER — FAMOTIDINE IN NACL 20-0.9 MG/50ML-% IV SOLN
20.0000 mg | Freq: Once | INTRAVENOUS | Status: AC
  Administered 2023-02-09: 20 mg via INTRAVENOUS
  Filled 2023-02-09: qty 50

## 2023-02-09 NOTE — ED Provider Notes (Signed)
[place] EMERGENCY DEPARTMENT AT [facility] Provider Note   CSN: [id number] Arrival date & time: 02/09/23  0820     History  Chief Complaint  Patient presents with   Chest Pain    Corey Park is a 52 y.o. male.  Patient is a 52 yo male with pmh of hypertension, hyperlipidemia, type 2 diabetes presenting for chest pain. Patient admits to epigastric abdominal pain with radiation to the chest that started this morning while resting at work. Admits to associated hiccups. No radiation to the neck, arms, or back. Admits to one episode of emesis that "tasted like blood". When asked to clarify, patient states it tasted like iron. 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  Denies any abdominal pain or recent loose bowel movements.  Denies a history of lower GI bleeds.  Of note, chart review demonstrates patient did have a heart catheterization procedure in June 2004 that was stable.  No angioplasty needed at that time. Dist LM to Prox LAD lesion is 25% stenosed. Prox LAD lesion is 50% stenosed. Mid LAD lesion is 25% stenosed. Ost Cx to Prox Cx lesion is 25% stenosed.    The history is provided by the patient. No language interpreter was used.  Chest Pain Associated symptoms: abdominal pain   Associated symptoms: no back pain, no cough, no fever, no palpitations, no shortness of breath and no vomiting        Home Medications Prior to Admission medications   Medication Sig Start Date End Date Taking? Authorizing Provider  famotidine (PEPCID) 20 MG tablet Take 1 tablet (20 mg total) by mouth 2 (two) times daily as needed for heartburn or indigestion. 02/09/23  Yes Edwin Dada P, DO  omeprazole (PRILOSEC) 20 MG capsule Take 1 capsule (20 mg total) by mouth daily. 02/09/23  Yes Edwin Dada P, DO  acetaminophen (TYLENOL) 325 MG tablet Take 2 tablets (650 mg total) by mouth every 6 (six) hours as needed for up to 30 doses for moderate pain or mild pain. 12/22/22   Terald Sleeper, MD   aspirin EC 81 MG tablet Take 1 tablet (81 mg total) by mouth daily. Swallow whole. 12/14/22   Modena Slater, DO  atorvastatin (LIPITOR) 80 MG tablet Take 1 tablet (80 mg total) by mouth daily. 12/15/22   Modena Slater, DO  calcium acetate (PHOSLO) 667 MG capsule Take 2 capsules (1,334 mg total) by mouth with breakfast, with lunch, and with evening meal. 12/14/22   Modena Slater, DO  carvedilol (COREG) 25 MG tablet Take 1 tablet (25 mg total) by mouth 2 (two) times daily with a meal. 12/14/22   Modena Slater, DO  diclofenac Sodium (VOLTAREN) 1 % GEL Apply 2 g topically 4 (four) times daily as needed (Pain). 10/20/22   Tiffany Kocher, DO  ferric citrate (AURYXIA) 1 GM 210 MG(Fe) tablet Take 2 tablets (420 mg total) by mouth daily. 12/14/22   Modena Slater, DO  gabapentin (NEURONTIN) 100 MG capsule Take 1 capsule (100 mg total) by mouth 3 (three) times daily. 12/14/22   Modena Slater, DO  Multiple Vitamins-Minerals (CERTAVITE/ANTIOXIDANTS) TABS Take 1 tablet by mouth at bedtime. 10/20/22   Tiffany Kocher, DO  multivitamin (RENA-VIT) TABS tablet Take 1 tablet by mouth daily. 11/22/22   [provider]  nitroGLYCERIN (NITROSTAT) 0.4 MG SL tablet Place 0.4 mg under the tongue every 5 (five) minutes as needed for chest pain. 12/19/22   [provider]  olmesartan (BENICAR) 20 MG tablet Take 1 tablet (  20 mg total) by mouth daily. 12/14/22   Modena Slater, DO  ondansetron (ZOFRAN-ODT) 4 MG disintegrating tablet Take 1 tablet (4 mg total) by mouth every 8 (eight) hours as needed for up to 12 doses for nausea or vomiting. 12/22/22   Trifan, Kermit Balo, MD  oxyCODONE (ROXICODONE) 5 MG immediate release tablet Take 1 tablet (5 mg total) by mouth every 12 (twelve) hours as needed for up to 12 doses for severe pain. 12/22/22   Terald Sleeper, MD  pantoprazole (PROTONIX) 40 MG tablet Take 1 tablet (40 mg total) by mouth daily. 12/14/22 01/13/23  Modena Slater, DO      Allergies    Patient has no known allergies.     Review of Systems   Review of Systems  Constitutional:  Negative for chills and fever.  HENT:  Negative for ear pain and sore throat.   Eyes:  Negative for pain and visual disturbance.  Respiratory:  Negative for cough and shortness of breath.   Cardiovascular:  Positive for chest pain. Negative for palpitations.  Gastrointestinal:  Positive for abdominal pain. Negative for vomiting.  Genitourinary:  Negative for dysuria and hematuria.  Musculoskeletal:  Negative for arthralgias and back pain.  Skin:  Negative for color change and rash.  Neurological:  Negative for seizures and syncope.  All other systems reviewed and are negative.   Physical Exam Updated Vital Signs BP (!) 120/57   Pulse 81   Temp 98.6 F (37 C) (Oral)   Resp 20   Ht 6\' 1"  (1.854 m)   Wt 99.8 kg   SpO2 97%   BMI 29.03 kg/m  Physical Exam Vitals and nursing note reviewed.  Constitutional:      General: He is not in acute distress.    Appearance: He is well-developed.  HENT:     Head: Normocephalic and atraumatic.  Eyes:     Conjunctiva/sclera: Conjunctivae normal.  Cardiovascular:     Rate and Rhythm: Normal rate and regular rhythm.     Heart sounds: No murmur heard. Pulmonary:     Effort: Pulmonary effort is normal. No respiratory distress.     Breath sounds: Normal breath sounds.  Abdominal:     Palpations: Abdomen is soft.     Tenderness: There is no abdominal tenderness.  Musculoskeletal:        General: No swelling.     Cervical back: Neck supple.  Skin:    General: Skin is warm and dry.     Capillary Refill: Capillary refill takes less than 2 seconds.  Neurological:     Mental Status: He is alert.  Psychiatric:        Mood and Affect: Mood normal.     ED Results / Procedures / Treatments   Labs (all labs ordered are listed, but only abnormal results are displayed) Labs Reviewed  BRAIN NATRIURETIC PEPTIDE - Abnormal; Notable for the following components:      Result Value   B  Natriuretic Peptide 282.1 (*)    All other components within normal limits  CBC WITH DIFFERENTIAL/PLATELET - Abnormal; Notable for the following components:   RBC 4.02 (*)    Hemoglobin 12.5 (*)    HCT 38.8 (*)    RDW 17.4 (*)    Platelets 136 (*)    All other components within normal limits  BASIC METABOLIC PANEL - Abnormal; Notable for the following components:   Sodium 133 (*)    Chloride 91 (*)    Glucose, Bld  219 (*)    BUN 47 (*)    Creatinine, Ser 11.62 (*)    GFR, Estimated 5 (*)    All other components within normal limits  TROPONIN I (HIGH SENSITIVITY) - Abnormal; Notable for the following components:   Troponin I (High Sensitivity) 78 (*)    All other components within normal limits  TROPONIN I (HIGH SENSITIVITY) - Abnormal; Notable for the following components:   Troponin I (High Sensitivity) 73 (*)    All other components within normal limits  MAGNESIUM    EKG None  Radiology DG Chest Portable 1 View  Result Date: 02/09/2023 CLINICAL DATA:  chest pain EXAM: PORTABLE CHEST 1 VIEW COMPARISON:  CXR 01/03/23 FINDINGS: No pleural effusion. No pneumothorax. No focal airspace opacity. Normal cardiac and mediastinal contours. No radiographically apparent displaced rib fractures. Visualized upper abdomen is unremarkable. Degenerative changes of the bilateral AC joints IMPRESSION: No focal airspace opacity Electronically Signed   By: Lorenza Cambridge M.D.   On: 02/09/2023 08:59    Procedures Procedures    Medications Ordered in ED Medications  bismuth subsalicylate (PEPTO BISMOL) 262 MG/15ML suspension 30 mL (has no administration in time range)  famotidine (PEPCID) IVPB 20 mg premix (0 mg Intravenous Stopped 02/09/23 1024)  alum & mag hydroxide-simeth (MAALOX/MYLANTA) 200-200-20 MG/5ML suspension 30 mL (30 mLs Oral Given 02/09/23 1610)    ED Course/ Medical Decision Making/ A&P                                 Medical Decision Making Amount and/or Complexity of Data  Reviewed Labs: ordered. Radiology: ordered.  Risk OTC drugs. Prescription drug management.   52 yo male with pmh of hypertension, hyperlipidemia, type 2 diabetes presenting for chest pain.  On my exam patient is alert and oriented x 3, no acute distress, afebrile, mildly hypertensive but otherwise stable.  No reproducible pain on exam.  Patient was originally called as a code STEMI by initial EKG reader.  Our STEMI doc has reviewed the EKG and has downgraded the STEMI protocol at this time.  No activation of Cath Lab.  Will proceed with lab work.  No aspirin given due to patient's possible bloody emesis today.  Troponins are elevated at 78 and 72 which appears consistent with his previous studies.  Electrolytes are stable.  Chest x-ray stable.  Of note, patient does mention severe hiccups with the onset of his chest pain with a history of acid reflux.  Will treat with GI cocktail including Pepcid and Maalox to see if we can help with his symptoms.  Reevaluation patient has complete solution of chest pain and hiccups.  Patient has been worked up recently for pulmonary embolism and negative.  Patient does not have any shortness of breath or tachycardia at this time.  Will consult cardiology before discharging the patient safely home.  I spoke with on-call cardiologist regarding the patient's presenting signs and symptoms.  She has reviewed the EKG and chronically elevated troponins.  As well as the cath last year.  Agrees that patient is safe for discharge at this time and will arrange for close outpatient follow-up.  Omeprazole and Pepcid prescription sent to pharmacy.  Patient in no distress and overall condition improved here in the ED. Detailed discussions were had with the patient regarding current findings, and need for close f/u with PCP or on call doctor. The patient has been instructed to return immediately if  the symptoms worsen in any way for re-evaluation. Patient verbalized understanding  and is in agreement with current care plan. All questions answered prior to discharge.         Final Clinical Impression(s) / ED Diagnoses Final diagnoses:  Chest pain, unspecified type  Intractable hiccups  Gastroesophageal reflux disease without esophagitis    Rx / DC Orders ED Discharge Orders          Ordered    omeprazole (PRILOSEC) 20 MG capsule  Daily        02/09/23 1412    famotidine (PEPCID) 20 MG tablet  2 times daily PRN        02/09/23 1412              Franne Forts, DO 02/09/23 1413

## 2023-02-09 NOTE — ED Triage Notes (Signed)
Per EMS and pt report, pt got to work and had a sudden episode of chest pain in the middle Of his chest. Rated the pain 10/10 and described it as sharp. Tried 1 nitroglycerin with no relief. Pt also reports he vomited this morning. Pt is blind but thought it tasted like blood. Pt is a dialysis pt and is compliant. Was due for dialysis today at 3pm. MWF is his usual schedule. Pt states he has had several episodes of this chest pain and has been diagnosed with CHF.

## 2023-02-09 NOTE — Discharge Instructions (Signed)
I have sent 2 prescriptions to your pharmacy to help with hiccups and acid reflux like symptoms.  I spoke with your cardiologist who states the office will call you to establish a outpatient follow-up appointment for your chest pain today.

## 2023-02-12 ENCOUNTER — Telehealth: Payer: Self-pay | Admitting: *Deleted

## 2023-02-12 ENCOUNTER — Emergency Department (HOSPITAL_COMMUNITY)
Admission: EM | Admit: 2023-02-12 | Discharge: 2023-02-14 | Disposition: A | Discharge status: Home / Self Care | Payer: Medicare Other | Attending: Emergency Medicine | Admitting: Emergency Medicine

## 2023-02-12 DIAGNOSIS — Z7982 Long term (current) use of aspirin: Secondary | ICD-10-CM | POA: Insufficient documentation

## 2023-02-12 DIAGNOSIS — I160 Hypertensive urgency: Secondary | ICD-10-CM | POA: Diagnosis not present

## 2023-02-12 DIAGNOSIS — I132 Hypertensive heart and chronic kidney disease with heart failure and with stage 5 chronic kidney disease, or end stage renal disease: Secondary | ICD-10-CM | POA: Diagnosis not present

## 2023-02-12 DIAGNOSIS — Z992 Dependence on renal dialysis: Secondary | ICD-10-CM | POA: Diagnosis not present

## 2023-02-12 DIAGNOSIS — E1122 Type 2 diabetes mellitus with diabetic chronic kidney disease: Secondary | ICD-10-CM | POA: Diagnosis not present

## 2023-02-12 DIAGNOSIS — N186 End stage renal disease: Secondary | ICD-10-CM | POA: Insufficient documentation

## 2023-02-12 DIAGNOSIS — I509 Heart failure, unspecified: Secondary | ICD-10-CM | POA: Insufficient documentation

## 2023-02-12 DIAGNOSIS — R0789 Other chest pain: Secondary | ICD-10-CM | POA: Diagnosis present

## 2023-02-12 LAB — CBG MONITORING, ED: Glucose-Capillary: 96 mg/dL (ref 70–99)

## 2023-02-12 NOTE — Progress Notes (Signed)
  Care Coordination Note  02/12/2023 Name: Corey Park MRN: [id number] DOB: 1970-09-10  Corey Park is a 52 y.o. year old male who is a primary care patient of [name], NP and is actively engaged with the care management team. I reached out to [employer] by phone today to assist with re-scheduling an initial visit with the RN Case Manager  Follow up plan: Unsuccessful telephone outreach attempt made. A HIPAA compliant phone message was left for the patient providing contact information and requesting a return call.   [facility]  Care Coordination Care Guide  Direct Dial: [phone]

## 2023-02-13 ENCOUNTER — Emergency Department (HOSPITAL_COMMUNITY): Payer: Medicare Other

## 2023-02-13 ENCOUNTER — Other Ambulatory Visit: Payer: Self-pay

## 2023-02-13 DIAGNOSIS — I160 Hypertensive urgency: Secondary | ICD-10-CM | POA: Diagnosis not present

## 2023-02-13 LAB — CBC WITH DIFFERENTIAL/PLATELET
Abs Immature Granulocytes: 0.02 10*3/uL (ref 0.00–0.07)
Basophils Absolute: 0 10*3/uL (ref 0.0–0.1)
Basophils Relative: 0 %
Eosinophils Absolute: 0.2 10*3/uL (ref 0.0–0.5)
Eosinophils Relative: 3 %
HCT: 39.2 % (ref 39.0–52.0)
Hemoglobin: 12.7 g/dL — ABNORMAL LOW (ref 13.0–17.0)
Immature Granulocytes: 0 %
Lymphocytes Relative: 21 %
Lymphs Abs: 1.6 10*3/uL (ref 0.7–4.0)
MCH: 31 pg (ref 26.0–34.0)
MCHC: 32.4 g/dL (ref 30.0–36.0)
MCV: 95.6 fL (ref 80.0–100.0)
Monocytes Absolute: 1 10*3/uL (ref 0.1–1.0)
Monocytes Relative: 13 %
Neutro Abs: 4.8 10*3/uL (ref 1.7–7.7)
Neutrophils Relative %: 63 %
Platelets: 201 10*3/uL (ref 150–400)
RBC: 4.1 MIL/uL — ABNORMAL LOW (ref 4.22–5.81)
RDW: 17.6 % — ABNORMAL HIGH (ref 11.5–15.5)
WBC: 7.7 10*3/uL (ref 4.0–10.5)
nRBC: 0 % (ref 0.0–0.2)

## 2023-02-13 LAB — COMPREHENSIVE METABOLIC PANEL
ALT: 14 U/L (ref 0–44)
AST: 21 U/L (ref 15–41)
Albumin: 3.6 g/dL (ref 3.5–5.0)
Alkaline Phosphatase: 61 U/L (ref 38–126)
Anion gap: 17 — ABNORMAL HIGH (ref 5–15)
BUN: 82 mg/dL — ABNORMAL HIGH (ref 6–20)
CO2: 24 mmol/L (ref 22–32)
Calcium: 9.3 mg/dL (ref 8.9–10.3)
Chloride: 97 mmol/L — ABNORMAL LOW (ref 98–111)
Creatinine, Ser: 17.67 mg/dL — ABNORMAL HIGH (ref 0.61–1.24)
GFR, Estimated: 3 mL/min — ABNORMAL LOW (ref >60)
Glucose, Bld: 71 mg/dL (ref 70–99)
Potassium: 5.6 mmol/L — ABNORMAL HIGH (ref 3.5–5.1)
Sodium: 138 mmol/L (ref 135–145)
Total Bilirubin: 0.7 mg/dL (ref 0.3–1.2)
Total Protein: 8 g/dL (ref 6.5–8.1)

## 2023-02-13 LAB — TROPONIN I (HIGH SENSITIVITY)
Troponin I (High Sensitivity): 101 ng/L (ref <18)
Troponin I (High Sensitivity): 101 ng/L (ref <18)
Troponin I (High Sensitivity): 97 ng/L — ABNORMAL HIGH (ref <18)
Troponin I (High Sensitivity): 96 ng/L — ABNORMAL HIGH (ref <18)

## 2023-02-13 LAB — POTASSIUM: Potassium: 5.1 mmol/L (ref 3.5–5.1)

## 2023-02-13 LAB — LIPASE, BLOOD: Lipase: 39 U/L (ref 11–51)

## 2023-02-13 LAB — HEPATITIS B SURFACE ANTIGEN: Hepatitis B Surface Ag: NONREACTIVE

## 2023-02-13 MED ORDER — SODIUM ZIRCONIUM CYCLOSILICATE 5 G PO PACK
5.0000 g | PACK | Freq: Once | ORAL | Status: AC
  Administered 2023-02-13: 5 g via ORAL
  Filled 2023-02-13: qty 1

## 2023-02-13 MED ORDER — ACETAMINOPHEN 325 MG PO TABS
650.0000 mg | ORAL_TABLET | Freq: Once | ORAL | Status: AC
  Administered 2023-02-13: 650 mg via ORAL
  Filled 2023-02-13: qty 2

## 2023-02-13 MED ORDER — LABETALOL HCL 5 MG/ML IV SOLN
20.0000 mg | Freq: Once | INTRAVENOUS | Status: AC
  Administered 2023-02-13: 20 mg via INTRAVENOUS
  Filled 2023-02-13: qty 4

## 2023-02-13 MED ORDER — FAMOTIDINE 40 MG/5ML PO SUSR
40.0000 mg | Freq: Once | ORAL | Status: AC
  Administered 2023-02-14: 40 mg via ORAL
  Filled 2023-02-13: qty 5

## 2023-02-13 MED ORDER — ONDANSETRON HCL 4 MG/2ML IJ SOLN
4.0000 mg | Freq: Once | INTRAMUSCULAR | Status: AC
  Administered 2023-02-13: 4 mg via INTRAVENOUS
  Filled 2023-02-13: qty 2

## 2023-02-13 MED ORDER — IOHEXOL 350 MG/ML SOLN
100.0000 mL | Freq: Once | INTRAVENOUS | Status: AC | PRN
  Administered 2023-02-13: 100 mL via INTRAVENOUS

## 2023-02-13 MED ORDER — CHLORHEXIDINE GLUCONATE CLOTH 2 % EX PADS: 6.0000 | MEDICATED_PAD | Freq: Every day | CUTANEOUS | Status: DC

## 2023-02-13 MED ORDER — CHLORPROMAZINE HCL 25 MG PO TABS
100.0000 mg | ORAL_TABLET | Freq: Once | ORAL | Status: AC
  Administered 2023-02-13: 100 mg via ORAL
  Filled 2023-02-13: qty 4

## 2023-02-13 NOTE — ED Provider Notes (Signed)
[place] [facility] DEPARTMENT AT [facility] Provider Note   CSN: [id number] Arrival date & time: 02/12/23  2340     History  Chief Complaint  Patient presents with   Chest Pain    Corey Park is a 52 y.o. male.  Patient with a history of ESRD on dialysis, hypertension, diabetes, CHF, dilated cardiomyopathy, legally blind, missed dialysis today because of no ride presenting with left-sided chest pain that onset about an hour ago while resting in bed.  Pain is constant to his left upper chest.  No radiation to his arm, neck or back.  Some associated shortness of breath which is baseline for him in the setting of his hiccups.  He did miss dialysis today due to not having a ride.  Denies any diaphoresis, cough, fever, abdominal pain, vomiting.  He has been on dialysis for the past 4 years.  Last catheterization showed nonobstructive disease.   he does not have any stents.  He states this pain is similar to the previous times he has been to the ED with pain.  The history is provided by the patient.  Chest Pain Associated symptoms: no abdominal pain, no dizziness, no fever, no headache, no nausea, no palpitations, no shortness of breath, no vomiting and no weakness        Home Medications Prior to Admission medications   Medication Sig Start Date End Date Taking? Authorizing Provider  acetaminophen (TYLENOL) 325 MG tablet Take 2 tablets (650 mg total) by mouth every 6 (six) hours as needed for up to 30 doses for moderate pain or mild pain. 12/22/22   Terald Sleeper, MD  aspirin EC 81 MG tablet Take 1 tablet (81 mg total) by mouth daily. Swallow whole. 12/14/22   Modena Slater, DO  atorvastatin (LIPITOR) 80 MG tablet Take 1 tablet (80 mg total) by mouth daily. 12/15/22   Modena Slater, DO  calcium acetate (PHOSLO) 667 MG capsule Take 2 capsules (1,334 mg total) by mouth with breakfast, with lunch, and with evening meal. 12/14/22   Modena Slater, DO  carvedilol (COREG) 25 MG  tablet Take 1 tablet (25 mg total) by mouth 2 (two) times daily with a meal. 12/14/22   Modena Slater, DO  diclofenac Sodium (VOLTAREN) 1 % GEL Apply 2 g topically 4 (four) times daily as needed (Pain). 10/20/22   Tiffany Kocher, DO  famotidine (PEPCID) 20 MG tablet Take 1 tablet (20 mg total) by mouth 2 (two) times daily as needed for heartburn or indigestion. 02/09/23   Edwin Dada P, DO  ferric citrate (AURYXIA) 1 GM 210 MG(Fe) tablet Take 2 tablets (420 mg total) by mouth daily. 12/14/22   Modena Slater, DO  gabapentin (NEURONTIN) 100 MG capsule Take 1 capsule (100 mg total) by mouth 3 (three) times daily. 12/14/22   Modena Slater, DO  Multiple Vitamins-Minerals (CERTAVITE/ANTIOXIDANTS) TABS Take 1 tablet by mouth at bedtime. 10/20/22   Tiffany Kocher, DO  multivitamin (RENA-VIT) TABS tablet Take 1 tablet by mouth daily. 11/22/22   [provider]  nitroGLYCERIN (NITROSTAT) 0.4 MG SL tablet Place 0.4 mg under the tongue every 5 (five) minutes as needed for chest pain. 12/19/22   [provider]  olmesartan (BENICAR) 20 MG tablet Take 1 tablet (20 mg total) by mouth daily. 12/14/22   Modena Slater, DO  omeprazole (PRILOSEC) 20 MG capsule Take 1 capsule (20 mg total) by mouth daily. 02/09/23   Edwin Dada P, DO  ondansetron (ZOFRAN-ODT) 4 MG disintegrating tablet Take  1 tablet (4 mg total) by mouth every 8 (eight) hours as needed for up to 12 doses for nausea or vomiting. 12/22/22   Trifan, Kermit Balo, MD  oxyCODONE (ROXICODONE) 5 MG immediate release tablet Take 1 tablet (5 mg total) by mouth every 12 (twelve) hours as needed for up to 12 doses for severe pain. 12/22/22   Terald Sleeper, MD  pantoprazole (PROTONIX) 40 MG tablet Take 1 tablet (40 mg total) by mouth daily. 12/14/22 01/13/23  Modena Slater, DO      Allergies    Patient has no known allergies.    Review of Systems   Review of Systems  Constitutional:  Negative for activity change, appetite change and fever.  HENT:  Negative for  congestion and rhinorrhea.   Respiratory:  Positive for chest tightness. Negative for shortness of breath.   Cardiovascular:  Positive for chest pain. Negative for palpitations.  Gastrointestinal:  Negative for abdominal pain, nausea and vomiting.  Genitourinary:  Negative for dysuria and hematuria.  Musculoskeletal:  Negative for arthralgias and myalgias.  Skin:  Negative for rash.  Neurological:  Negative for dizziness, weakness and headaches.   all other systems are negative except as noted in the HPI and PMH.    Physical Exam Updated Vital Signs BP (!) 193/110 (BP Location: Left Arm)   Pulse 92   Temp 98.6 F (37 C) (Oral)   Resp (!) 28   Ht 6\' 1"  (1.854 m)   Wt 99.8 kg   SpO2 97%   BMI 29.03 kg/m  Physical Exam Vitals and nursing note reviewed.  Constitutional:      General: He is not in acute distress.    Appearance: He is well-developed.     Comments: Frequent hiccups  HENT:     Head: Normocephalic and atraumatic.     Mouth/Throat:     Pharynx: No oropharyngeal exudate.  Eyes:     Conjunctiva/sclera: Conjunctivae normal.     Pupils: Pupils are equal, round, and reactive to light.  Neck:     Comments: No meningismus. Cardiovascular:     Rate and Rhythm: Normal rate and regular rhythm.     Heart sounds: Normal heart sounds. No murmur heard. Pulmonary:     Effort: Pulmonary effort is normal. No respiratory distress.     Breath sounds: Rhonchi present.     Comments: Reproducible left-sided chest wall tenderness Chest:     Chest wall: Tenderness present.  Abdominal:     Palpations: Abdomen is soft.     Tenderness: There is no abdominal tenderness. There is no guarding or rebound.  Musculoskeletal:        General: No tenderness. Normal range of motion.     Cervical back: Normal range of motion and neck supple.     Comments: Dialysis fistula right upper extremity with thrill  Skin:    General: Skin is warm.  Neurological:     Mental Status: He is alert and  oriented to person, place, and time.     Cranial Nerves: No cranial nerve deficit.     Motor: No abnormal muscle tone.     Coordination: Coordination normal.     Comments:  5/5 strength throughout. CN 2-12 intact.Equal grip strength.   Psychiatric:        Behavior: Behavior normal.     ED Results / Procedures / Treatments   Labs (all labs ordered are listed, but only abnormal results are displayed) Labs Reviewed  CBC WITH DIFFERENTIAL/PLATELET - Abnormal;  Notable for the following components:      Result Value   RBC 4.10 (*)    Hemoglobin 12.7 (*)    RDW 17.6 (*)    All other components within normal limits  COMPREHENSIVE METABOLIC PANEL - Abnormal; Notable for the following components:   Potassium 5.6 (*)    Chloride 97 (*)    BUN 82 (*)    Creatinine, Ser 17.67 (*)    GFR, Estimated 3 (*)    Anion gap 17 (*)    All other components within normal limits  TROPONIN I (HIGH SENSITIVITY) - Abnormal; Notable for the following components:   Troponin I (High Sensitivity) 101 (*)    All other components within normal limits  TROPONIN I (HIGH SENSITIVITY) - Abnormal; Notable for the following components:   Troponin I (High Sensitivity) 101 (*)    All other components within normal limits  TROPONIN I (HIGH SENSITIVITY) - Abnormal; Notable for the following components:   Troponin I (High Sensitivity) 97 (*)    All other components within normal limits  LIPASE, BLOOD  POTASSIUM  CBG MONITORING, ED  TROPONIN I (HIGH SENSITIVITY)    EKG EKG Interpretation Date/Time:  Monday February 12 2023 23:43:44 EDT Ventricular Rate:  107 PR Interval:  168 QRS Duration:  101 QT Interval:  348 QTC Calculation: 465 R Axis:   -38  Text Interpretation: Sinus tachycardia LVH with secondary repolarization abnormality Anterior Q waves, possibly due to LVH No significant change was found Confirmed by Glynn Octave [phone]) on 02/13/2023 12:24:31 AM  Radiology CT Angio Chest/Abd/Pel for  Dissection W and/or Wo Contrast  Result Date: 02/13/2023 CLINICAL DATA:  Sudden onset upper left chest pain. Acute aortic syndrome suspected. On chronic dialysis, missed yesterday's appointment with last dialysis August 16. EXAM: CT ANGIOGRAPHY CHEST, ABDOMEN AND PELVIS TECHNIQUE: Non-contrast CT of the chest was initially obtained. Multidetector CT imaging through the chest, abdomen and pelvis was performed using the standard protocol during bolus administration of intravenous contrast. Multiplanar reconstructed images and MIPs were obtained and reviewed to evaluate the vascular anatomy. RADIATION DOSE REDUCTION: This exam was performed according to the departmental dose-optimization program which includes automated exposure control, adjustment of the mA and/or kV according to patient size and/or use of iterative reconstruction technique. CONTRAST:  OMNIPAQUE IOHEXOL 350 MG/ML SOLN COMPARISON:  CTA chest 12/21/2022 and 10/18/2022, CT abdomen pelvis without contrast 12/22/2022 and 03/08/2020. FINDINGS: CTA CHEST FINDINGS Cardiovascular: The pulmonary trunk is 3.3 cm indicating arterial hypertension, but no embolic arterial filling defect is seen. Mild cardiomegaly is unchanged, with left chamber predominance and no pericardial effusion. The superior pulmonary veins are mildly distended compared to the prior studies. There are heavy three-vessel coronary artery calcifications, scattered calcific plaques in the aorta and great vessels. There is no aortic or great vessel aneurysm, dissection or stenosis, hematoma or penetrating ulcer. Mediastinum/Nodes: No enlarged mediastinal, hilar, or axillary lymph nodes. Thyroid gland, trachea, and esophagus demonstrate no significant findings. Both main bronchi are patent. Lungs/Pleura: There is diffuse bronchial thickening. There is no visible bronchial plugging. There is mosaicism in the lower lobes which could be due to low lung volumes or could indicate changes of air  trapping due to small airways disease. The lungs are otherwise clear. No pleural effusion or pneumothorax. Fatty infiltration of the pleura is noted in the bilateral lower hemithoraces. Musculoskeletal: No chest wall abnormality. No acute or significant osseous findings. Review of the MIP images confirms the above findings. CTA ABDOMEN AND PELVIS  FINDINGS VASCULAR Aorta: There are moderate to heavy age-advanced calcific plaques without aneurysm, stenosis or dissection. Celiac: There is a common trunk giving rise to the celiac artery and SMA. There are mild calcific plaques in the common trunk and celiac artery but no aneurysm, stenosis or dissection. SMA: Patent without evidence of aneurysm, dissection, vasculitis or significant stenosis. Renals: There are moderate calcific plaques, heaviest more distally with renovascular calcifications at both renal hila. The proximal renal arteries show no flow-limiting stenoses but there is at least moderate irregular stenosis at the initial branching of both renal arteries into the hilar branches. IMA: Patent without evidence of aneurysm, dissection, vasculitis or significant stenosis. Inflow: There are moderate to heavy calcific plaques. Irregular up to 50% luminal stenosis seen in the internal iliac arteries, but the bilateral common iliac and external iliac arteries do not show significant stenosis. There are moderate to heavy calcifications in the proximal outflow arteries as well. This is associated with up to 50% stenosis in the proximal superficial femoral arteries. Veins: Unopacified and not evaluated. Review of the MIP images confirms the above findings. NON-VASCULAR Hepatobiliary: No focal liver abnormality is seen. No calcified gallstones, gallbladder wall thickening, or biliary dilatation. Pancreas: No abnormality. Spleen: No abnormality. Adrenals/Urinary Tract: There are bilateral partially atrophic kidneys. No mass enhancement. There is chronic symmetric  perinephric stranding. There is no adrenal mass. There is no urinary stone or obstruction. There is mild bladder thickening versus underdistention. Correlate clinically for underlying cystitis. Stomach/Bowel: Small hiatal hernia. Unremarkable stomach. Normal caliber unopacified small bowel without inflammatory changes. Normal appendix. There are diffuse scattered colonic diverticula with no findings of acute diverticulitis. Lymphatic: No appreciable lymphadenopathy. Reproductive: Upper limits of normal size of prostate. Unchanged. Both testicles are in the scrotal sac. Other: Right groin area hematoma noted previously has resolved. There are no incarcerated hernias and no free fluid, free hemorrhage or free air. Musculoskeletal: Somewhat dense bones consistent with renal osteodystrophy. Mild hip DJD. Mild degenerative change lumbar spine. No acute or other significant osseous findings. Review of the MIP images confirms the above findings. IMPRESSION: 1. No findings of acute aortic syndrome. 2. Aortic and coronary artery atherosclerosis. 3. Prominent pulmonary trunk but no evidence of pulmonary arterial embolus. 4. Cardiomegaly with mild distention of the superior pulmonary veins. No overt edema. 5. Bronchitis, with mosaicism in the lower lobes which could be due to low lung volumes or due to air trapping related to small airways disease. 6. Small hiatal hernia. 7. Abdominal aortic, iliofemoral, and visceral branch vessel atherosclerosis as detailed above. 8. Diverticulosis without evidence of diverticulitis. 9. Cystitis versus bladder nondistention. 10. Renal atrophy with chronic perinephric stranding. 11. Renal osteodystrophy. Electronically Signed   By: Almira Bar M.D.   On: 02/13/2023 04:07   DG Chest 2 View  Result Date: 02/13/2023 CLINICAL DATA:  Chest pain EXAM: CHEST - 2 VIEW COMPARISON:  02/09/2023 FINDINGS: Low lung volumes accentuate cardiomediastinal silhouette and pulmonary vascularity. Bilateral  reticular opacities may be due to hypoinflation or mild interstitial edema. No pleural effusion or pneumothorax. IMPRESSION: Bilateral reticular opacities may be due to mild edema or hypoinflation. Electronically Signed   By: Minerva Fester M.D.   On: 02/13/2023 02:15    Procedures Procedures    Medications Ordered in ED Medications - No data to display  ED Course/ Medical Decision Making/ A&P  Medical Decision Making Amount and/or Complexity of Data Reviewed Independent Historian: EMS Labs: ordered. Decision-making details documented in ED Course. Radiology: ordered and independent interpretation performed. Decision-making details documented in ED Course. ECG/medicine tests: ordered and independent interpretation performed. Decision-making details documented in ED Course.  Risk OTC drugs. Prescription drug management. Decision regarding hospitalization.   ESRD patient with left-sided chest pain onset at rest.  EKG shows LVH with sinus rhythm, no acute ST changes.  Catheterization earlier this year showed nonobstructive disease.  Mild edema on CXR. Results reviewed and interpreted by me.  Chest pain seems reproducible and atypical for ACS. Recent reassuring cardiac evaluation.   CTA obtained given elevated blood pressure and chest pain. This is negative for acute aortic pathology or large pulmonary embolism.  Remains ongoing chest pain with SOB. Refusing to walk secondary to SOB and pain. O2 saturations do drop into 70s with sleeping. Concern for OSA.   Labs with slightly elevated troponin of 101, but flat on repeat. Not consistent with ACS, likely elevated in setting of ESRD. BP improved with IV labetalol.  K 5.1 on repeat.   With ongoing dyspnea and hypoxia, may benefit from dialysis today. D/w Dr. Imogene Burn of hospitalist service who queries whether patient can be discharged after dialysis. Hospitalist team will evaluate as well as Dr. Arlean Hopping  from nephrology.   Dr. Particia Nearing aware of patient at shift change.         Final Clinical Impression(s) / ED Diagnoses Final diagnoses:  Atypical chest pain  Hypertensive urgency  ESRD (end stage renal disease) (HCC)    Rx / DC Orders ED Discharge Orders     None         Deaira Leckey, Jeannett Senior, MD 02/13/23 [phone]

## 2023-02-13 NOTE — Progress Notes (Addendum)
Asked to see this patient for dialysis. He is not being admitted. Pt presented w/ c/o chest pain, L chest late last night. Some assoc SOB. Did miss his HD yesterday due to not having a ride. Last heart cath was negative. In ED required 2 L O2 for SpO2's in the 80s. He rec'd lokelma for K+ 5.6. Creat 18. Also got IV labetalol, thorazine and tylenol. Bilateral perihilar opacities are c/w IS edema. Plan will be for "ED HD". Patient is not being admitted. Pt will go to the dialysis unit upstairs when they are ready for the pt. Then the pt will get dialysis. When dialysis is completed pt will be sent back to ED for reassessment.   [facility] MWF 4h  450/ 1.5   100kg   2/2 bath  AVF  Heparin none    Vinson Moselle  MD  [facility] 02/13/2023, 11:23 AM  Recent Labs  Lab 02/09/23 0856 02/13/23 0134 02/13/23 0325  HGB 12.5* 12.7*  --   ALBUMIN  --  3.6  --   CALCIUM 9.3 9.3  --   CREATININE 11.62* 17.67*  --   K 4.8 5.6* 5.1    Inpatient medications:  labetalol  20 mg Intravenous Once

## 2023-02-13 NOTE — ED Provider Notes (Signed)
Pt signed out by Dr. Manus Gunning pending nephrology.  [facility] hospitalists did not want to admit as cp is chronic and troponins are flat and recent cath was ok.   Dr. Arlean Hopping (nephrology) will take pt to dialysis.  Plan for dialysis and re-assess in the ED after dialysis.  I anticipate d/c home.   Jacalyn Lefevre, MD 02/13/23 [phone]

## 2023-02-13 NOTE — ED Notes (Signed)
Pt refused to ambulate with pulse ox for ED tech.  Provider notified of same.

## 2023-02-13 NOTE — ED Provider Notes (Signed)
  Physical Exam  BP (!) 175/113 (BP Location: Left Arm)   Pulse 89   Temp 97.9 F (36.6 C) (Oral)   Resp 20   Ht 6\' 1"  (1.854 m)   Wt 99.8 kg   SpO2 96%   BMI 29.03 kg/m   Physical Exam  Procedures  Procedures  ED Course / MDM    Medical Decision Making Amount and/or Complexity of Data Reviewed Labs: ordered. Radiology: ordered.  Risk OTC drugs. Prescription drug management. Decision regarding hospitalization.    Patient vomiting.  Will write for Zofran.  Still pending dialysis.  Reviewing notes it appears plan is for reeval and potential discharge after dialysis.   Benjiman Core, MD 02/13/23 2023

## 2023-02-13 NOTE — ED Notes (Signed)
PT eating breakfast.

## 2023-02-13 NOTE — ED Triage Notes (Signed)
Chief Complaint  Patient presents with   Chest Pain   Pt presents to ED 21 via EMS from home with above.  Pt endorses sudden onset of chest pain to upper left chest around 30 mins ago.  Received 324 mg Aspirin and dose of nitroglycerin en route with no relief.  MWF dialysis pt; missed Monday treatment but went Friday.  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

## 2023-02-13 NOTE — ED Notes (Signed)
Pt requesting heart burn and acid reflux meds as well as states he feels nauseous

## 2023-02-14 DIAGNOSIS — I160 Hypertensive urgency: Secondary | ICD-10-CM | POA: Diagnosis not present

## 2023-02-14 LAB — I-STAT CHEM 8, ED
BUN: 57 mg/dL — ABNORMAL HIGH (ref 6–20)
Calcium, Ion: 0.95 mmol/L — ABNORMAL LOW (ref 1.15–1.40)
Chloride: 99 mmol/L (ref 98–111)
Creatinine, Ser: 12.7 mg/dL — ABNORMAL HIGH (ref 0.61–1.24)
Glucose, Bld: 106 mg/dL — ABNORMAL HIGH (ref 70–99)
HCT: 38 % — ABNORMAL LOW (ref 39.0–52.0)
Hemoglobin: 12.9 g/dL — ABNORMAL LOW (ref 13.0–17.0)
Potassium: 3.9 mmol/L (ref 3.5–5.1)
Sodium: 137 mmol/L (ref 135–145)
TCO2: 25 mmol/L (ref 22–32)

## 2023-02-14 LAB — HEPATITIS B SURFACE ANTIBODY, QUANTITATIVE: Hep B S AB Quant (Post): 92.3 m[IU]/mL

## 2023-02-14 MED ORDER — ONDANSETRON HCL 4 MG/2ML IJ SOLN
4.0000 mg | Freq: Once | INTRAMUSCULAR | Status: AC
  Administered 2023-02-14: 4 mg via INTRAVENOUS
  Filled 2023-02-14: qty 2

## 2023-02-14 MED ORDER — CARVEDILOL 12.5 MG PO TABS
25.0000 mg | ORAL_TABLET | Freq: Two times a day (BID) | ORAL | Status: DC
  Administered 2023-02-14: 25 mg via ORAL
  Filled 2023-02-14 (×2): qty 2

## 2023-02-14 NOTE — ED Notes (Signed)
Patient back from hemodialysis, nurse asked patient how did the hemodialysis make him. Patient states " well I didn't want anymore because I'm sick and can't breath and need my other medications". "Nurse then continued to eduction to patient dialysis could have helped with some of the symptoms he is having. Patient becomes angry and states "I don't care, I don't want it my medications". Patient then states he is throwing and would like something to drink, nurse eduaction patient on hold oral liquid due to having N/V. Patient shoots at nurse "I Know my body dont tell me what I need". Nurse made MD aware of patient needs for evaluation post dialysis.

## 2023-02-14 NOTE — Discharge Instructions (Signed)
You were seen today and had dialysis.  Your chest pain workup is reassuring.  Make sure that you are taking your blood pressure medications as directed and going to dialysis sessions as scheduled.

## 2023-02-14 NOTE — ED Notes (Signed)
Patient request nausea medications, Nurse made MD aware at this time.

## 2023-02-14 NOTE — ED Notes (Signed)
Patient completed 1 hour and 42 mins and 1.4L was removed. Patient per hemodialysis nurse patient refused full completion of dialysis.

## 2023-02-14 NOTE — ED Provider Notes (Signed)
Patient returned from dialysis.  Did not finish a full session.  Had approximately 1.4 L pulled off.  He states "I am still sick and nobody has done anything for me."  Reports ongoing nausea.  Requesting his home medications.  Previous labs reviewed.  Will repeat chemistry to ensure potassium has appropriately down trended.  2:53 AM Patient potassium down trended to 3.9.  Does not appear to have any acute emergent condition requiring admission.  Encouraged him to take his blood pressure medications as prescribed and follow-up at regularly scheduled dialysis to prevent complication.  Physical Exam  BP (!) 185/108 (BP Location: Left Arm)   Pulse (P) 85   Temp 97.8 F (36.6 C) (Oral)   Resp 19   Ht 1.854 m (6\' 1" )   Wt 99.8 kg   SpO2 94%   BMI 29.03 kg/m   Physical Exam Awake, alert, no acute distress Hiccuping Procedures  Procedures  ED Course / MDM    Medical Decision Making Amount and/or Complexity of Data Reviewed Labs: ordered. Radiology: ordered.  Risk OTC drugs. Prescription drug management. Decision regarding hospitalization.   Problem List Items Addressed This Visit       Cardiovascular and Mediastinum   Hypertensive urgency   Relevant Medications   carvedilol (COREG) tablet 25 mg (Start on 02/14/2023  8:00 AM)     Other   Atypical chest pain - Primary   Other Visit Diagnoses     ESRD (end stage renal disease) (HCC)                 Shon Baton, MD 02/14/23 [phone]

## 2023-02-14 NOTE — Progress Notes (Signed)
   02/14/23 0030  Vitals  Temp 98.1 F (36.7 C)  Temp Source Oral  BP (!) 183/100  BP Location Left Arm  BP Method Automatic  Patient Position (if appropriate) Lying  Pulse Rate 85  Pulse Rate Source Monitor  ECG Heart Rate 85  Resp 20  Oxygen Therapy  SpO2 94 %  O2 Device Room Air  During Treatment Monitoring  Blood Flow Rate (mL/min) 0 mL/min  Arterial Pressure (mmHg) -1.41 mmHg  Venous Pressure (mmHg) -0.2 mmHg  TMP (mmHg) -48.08 mmHg  Ultrafiltration Rate (mL/min) 1194 mL/min  Dialysate Flow Rate (mL/min) 0 ml/min  Dialysate Potassium Concentration 2  Dialysate Calcium Concentration 2.5  Duration of HD Treatment -hour(s) 1.71 hour(s)  Cumulative Fluid Removed (mL) per Treatment  1358.63  HD Safety Checks Performed Yes  Intra-Hemodialysis Comments Tx completed (Pt request to terminate treatment.)  Post Treatment  Dialyzer Clearance Lightly streaked  Liters Processed 38.3  Fluid Removed (mL) 1400 mL  Tolerated HD Treatment No (Comment)  Post-Hemodialysis Comments  (Pt request to sign off early.)  AVG/AVF Arterial Site Held (minutes) 7 minutes  AVG/AVF Venous Site Held (minutes) 7 minutes  Fistula / Graft Right Upper arm Arteriovenous fistula  Placement Date/Time: 10/16/19 1610   Placed prior to admission: No  Orientation: Right  Access Location: Upper arm  Access Type: (c) Arteriovenous fistula  Site Condition No complications  Fistula / Graft Assessment Present  Status Deaccessed  Needle Size 15  Drainage Description None   Pt transported to E/R after dialysis. Nephrologist aware Pt request to be off early.

## 2023-02-19 NOTE — Progress Notes (Signed)
  Care Coordination Note  02/19/2023 Name: Corey Park MRN: [id number] DOB: [birth year]  [name] is a 52 y.o. year old male who is a primary care patient of [name], NP and is actively engaged with the care management team. I reached out to [facility] by phone today to assist with re-scheduling a follow up visit with the RN Case Manager  Follow up plan: Unsuccessful telephone outreach attempt made. A HIPAA compliant phone message was left for the patient providing contact information and requesting a return call.  We have been unable to make contact with the patient for follow up. The care management team is available to follow up with the patient after provider conversation with the patient regarding recommendation for care management engagement and subsequent re-referral to the care management team.  [facility] Coordination Care Guide  Direct Dial: [phone]

## 2023-02-20 NOTE — Progress Notes (Deleted)
Cardiology Office Note:   Date:  02/20/2023  ID:  Corey Park, DOB 1970-11-30, MRN [id number] PCP: Corey Fendt, NP  [facility] Providers Cardiologist:  Corey Schultz, MD { Click to update primary MD,subspecialty MD or APP then REFRESH:1}   History of Present Illness:   Corey Park is a 52 y.o. male with history of CAD, ESRD, hypertension, cardiomyopathy with improved LVEF, DM type II.  Of note, chocardiogram October 2023 showed ejection fraction 55% with basal inferior hypokinesis. 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 Patient was treated medically. However he has continued to have intermittent chest pain with multiple evaluations in the emergency room. He ultimately underwent LHC on 12/13/2022.  This demonstrated mild to moderate CAD with 25% distal left main to proximal LAD, 50% proximal LAD, 25% ostial circumflex stenosis, but otherwise no significant stenosis.  LVEF was normal.  Echo also showed normal LVEF 60 to 65% with moderate LVH and grade 2 diastolic dysfunction.  Left atrium was mildly dilated. While out of state in [place] on 12/16/22, patient seen for chest pain and found to have right femoral pseudoaneurysm which was treated with thombin injction. Patient seen again in consult for chest pain 6/28, not felt to be of cardiac etiology. During that visit, patient had ongoing right groin pain and CT appeared to show stable hematoma. LE arterial duplex showed hematoma measuring 7.6 cm x 2.7 cm. He was again seen in consult 01/04/23 with chest pain. EKG without change from prior tracings. Troponin elevated but flat, consistent with volume overload in ESRD patient. Per ED notes, appears that he was sent home with Imdur 30mg  that visit.   Patient again in the ED 01/16/23, 02/09/23, 02/12/23 with chest pain.   Today patient denies chest pain, shortness of breath, lower extremity edema, fatigue, palpitations, melena, hematuria,  hemoptysis, diaphoresis, weakness, presyncope, syncope, orthopnea, and PND.   Studies Reviewed:    EKG:        ***  Risk Assessment/Calculations:   {Does this patient have ATRIAL FIBRILLATION?:[id number]} No BP recorded.  {Refresh Note OR Click here to enter BP  :1}***        Physical Exam:   VS:  There were no vitals taken for this visit.   Wt Readings from Last 3 Encounters:  02/13/23 220 lb (99.8 kg)  02/09/23 220 lb (99.8 kg)  01/03/23 220 lb 3.8 oz (99.9 kg)     Physical Exam   ASSESSMENT AND PLAN:   No problem-specific Assessment & Plan notes found for this encounter.       {Are you ordering a CV Procedure (e.g. stress test, cath, DCCV, TEE, etc)?   Press F2        :[id number]}   Signed, Perlie Gold, PA-C

## 2023-02-21 ENCOUNTER — Ambulatory Visit: Payer: Medicare Other | Attending: Cardiology | Admitting: Cardiology

## 2023-02-21 NOTE — Progress Notes (Signed)
Concerns for congestive heart failure due to history.

## 2023-02-22 ENCOUNTER — Encounter: Payer: Self-pay | Admitting: Cardiology

## 2023-02-23 ENCOUNTER — Telehealth: Payer: Self-pay

## 2023-02-23 NOTE — Telephone Encounter (Signed)
Transition Care Management Unsuccessful Follow-up Telephone Call  Date of discharge and from where:  02/14/2023 [facility]  Attempts:  2nd Attempt  Reason for unsuccessful TCM follow-up call:  Voice mail full  Corey Park [facility]  [facility] Population Health Community Resource Care Guide   [email or web address]  ?? [phone]   Website: [email or web address]  [email or web address]

## 2023-02-23 NOTE — Telephone Encounter (Signed)
Transition Care Management Unsuccessful Follow-up Telephone Call  Date of discharge and from where:  02/14/2023 [facility]  Attempts:  1st Attempt  Reason for unsuccessful TCM follow-up call:  No answer/busy  [facility]  [facility] Population Health Community Resource Care Guide   ??[email or web address]  ?? [phone]   Website: [email or web address]  [email or web address]

## 2023-04-01 ENCOUNTER — Other Ambulatory Visit: Payer: Self-pay

## 2023-04-02 ENCOUNTER — Other Ambulatory Visit: Payer: Self-pay

## 2023-04-02 ENCOUNTER — Emergency Department (HOSPITAL_COMMUNITY): Payer: Medicare Other

## 2023-04-02 ENCOUNTER — Observation Stay (HOSPITAL_COMMUNITY)
Admission: EM | Admit: 2023-04-02 | Discharge: 2023-04-03 | Disposition: A | Discharge status: Home / Self Care | Payer: Medicare Other | Attending: Internal Medicine | Admitting: Internal Medicine

## 2023-04-02 ENCOUNTER — Other Ambulatory Visit (HOSPITAL_COMMUNITY): Payer: Medicare Other

## 2023-04-02 ENCOUNTER — Encounter (HOSPITAL_COMMUNITY): Payer: Self-pay | Admitting: Emergency Medicine

## 2023-04-02 DIAGNOSIS — Z8616 Personal history of COVID-19: Secondary | ICD-10-CM | POA: Diagnosis not present

## 2023-04-02 DIAGNOSIS — E119 Type 2 diabetes mellitus without complications: Secondary | ICD-10-CM

## 2023-04-02 DIAGNOSIS — R0602 Shortness of breath: Secondary | ICD-10-CM | POA: Diagnosis not present

## 2023-04-02 DIAGNOSIS — J9601 Acute respiratory failure with hypoxia: Secondary | ICD-10-CM | POA: Diagnosis not present

## 2023-04-02 DIAGNOSIS — I5032 Chronic diastolic (congestive) heart failure: Secondary | ICD-10-CM | POA: Diagnosis not present

## 2023-04-02 DIAGNOSIS — Z7982 Long term (current) use of aspirin: Secondary | ICD-10-CM | POA: Diagnosis not present

## 2023-04-02 DIAGNOSIS — R0600 Dyspnea, unspecified: Secondary | ICD-10-CM | POA: Diagnosis present

## 2023-04-02 DIAGNOSIS — I132 Hypertensive heart and chronic kidney disease with heart failure and with stage 5 chronic kidney disease, or end stage renal disease: Secondary | ICD-10-CM | POA: Insufficient documentation

## 2023-04-02 DIAGNOSIS — Z79899 Other long term (current) drug therapy: Secondary | ICD-10-CM | POA: Insufficient documentation

## 2023-04-02 DIAGNOSIS — E1122 Type 2 diabetes mellitus with diabetic chronic kidney disease: Secondary | ICD-10-CM | POA: Insufficient documentation

## 2023-04-02 DIAGNOSIS — R7989 Other specified abnormal findings of blood chemistry: Secondary | ICD-10-CM | POA: Diagnosis present

## 2023-04-02 DIAGNOSIS — D631 Anemia in chronic kidney disease: Secondary | ICD-10-CM | POA: Insufficient documentation

## 2023-04-02 DIAGNOSIS — N186 End stage renal disease: Secondary | ICD-10-CM

## 2023-04-02 DIAGNOSIS — E8770 Fluid overload, unspecified: Secondary | ICD-10-CM | POA: Diagnosis not present

## 2023-04-02 DIAGNOSIS — Z992 Dependence on renal dialysis: Secondary | ICD-10-CM | POA: Diagnosis not present

## 2023-04-02 DIAGNOSIS — E875 Hyperkalemia: Principal | ICD-10-CM

## 2023-04-02 DIAGNOSIS — I5030 Unspecified diastolic (congestive) heart failure: Secondary | ICD-10-CM | POA: Diagnosis present

## 2023-04-02 DIAGNOSIS — K219 Gastro-esophageal reflux disease without esophagitis: Secondary | ICD-10-CM | POA: Diagnosis present

## 2023-04-02 DIAGNOSIS — I10 Essential (primary) hypertension: Secondary | ICD-10-CM | POA: Diagnosis present

## 2023-04-02 DIAGNOSIS — J45909 Unspecified asthma, uncomplicated: Secondary | ICD-10-CM | POA: Insufficient documentation

## 2023-04-02 DIAGNOSIS — E785 Hyperlipidemia, unspecified: Secondary | ICD-10-CM | POA: Diagnosis present

## 2023-04-02 DIAGNOSIS — I5033 Acute on chronic diastolic (congestive) heart failure: Secondary | ICD-10-CM

## 2023-04-02 DIAGNOSIS — Z1152 Encounter for screening for COVID-19: Secondary | ICD-10-CM | POA: Diagnosis not present

## 2023-04-02 HISTORY — DX: Acute respiratory failure with hypoxia: J96.01

## 2023-04-02 LAB — CBC WITH DIFFERENTIAL/PLATELET
Abs Immature Granulocytes: 0.03 10*3/uL (ref 0.00–0.07)
Abs Immature Granulocytes: 0.03 10*3/uL (ref 0.00–0.07)
Basophils Absolute: 0 10*3/uL (ref 0.0–0.1)
Basophils Absolute: 0 10*3/uL (ref 0.0–0.1)
Basophils Relative: 0 %
Basophils Relative: 0 %
Eosinophils Absolute: 0.2 10*3/uL (ref 0.0–0.5)
Eosinophils Absolute: 0.3 10*3/uL (ref 0.0–0.5)
Eosinophils Relative: 3 %
Eosinophils Relative: 4 %
HCT: 29.7 % — ABNORMAL LOW (ref 39.0–52.0)
HCT: 32.8 % — ABNORMAL LOW (ref 39.0–52.0)
Hemoglobin: 10.6 g/dL — ABNORMAL LOW (ref 13.0–17.0)
Hemoglobin: 9.5 g/dL — ABNORMAL LOW (ref 13.0–17.0)
Immature Granulocytes: 0 %
Immature Granulocytes: 0 %
Lymphocytes Relative: 11 %
Lymphocytes Relative: 12 %
Lymphs Abs: 0.9 10*3/uL (ref 0.7–4.0)
Lymphs Abs: 1 10*3/uL (ref 0.7–4.0)
MCH: 30.6 pg (ref 26.0–34.0)
MCH: 30.9 pg (ref 26.0–34.0)
MCHC: 32 g/dL (ref 30.0–36.0)
MCHC: 32.3 g/dL (ref 30.0–36.0)
MCV: 94.8 fL (ref 80.0–100.0)
MCV: 96.7 fL (ref 80.0–100.0)
Monocytes Absolute: 0.7 10*3/uL (ref 0.1–1.0)
Monocytes Absolute: 0.8 10*3/uL (ref 0.1–1.0)
Monocytes Relative: 9 %
Monocytes Relative: 9 %
Neutro Abs: 6 10*3/uL (ref 1.7–7.7)
Neutro Abs: 6.1 10*3/uL (ref 1.7–7.7)
Neutrophils Relative %: 75 %
Neutrophils Relative %: 77 %
Platelets: 121 10*3/uL — ABNORMAL LOW (ref 150–400)
Platelets: 148 10*3/uL — ABNORMAL LOW (ref 150–400)
RBC: 3.07 MIL/uL — ABNORMAL LOW (ref 4.22–5.81)
RBC: 3.46 MIL/uL — ABNORMAL LOW (ref 4.22–5.81)
RDW: 18.1 % — ABNORMAL HIGH (ref 11.5–15.5)
RDW: 18.2 % — ABNORMAL HIGH (ref 11.5–15.5)
WBC: 8 10*3/uL (ref 4.0–10.5)
WBC: 8.1 10*3/uL (ref 4.0–10.5)
nRBC: 0 % (ref 0.0–0.2)
nRBC: 0 % (ref 0.0–0.2)

## 2023-04-02 LAB — COMPREHENSIVE METABOLIC PANEL
ALT: 13 U/L (ref 0–44)
AST: 23 U/L (ref 15–41)
Albumin: 3 g/dL — ABNORMAL LOW (ref 3.5–5.0)
Alkaline Phosphatase: 49 U/L (ref 38–126)
Anion gap: 17 — ABNORMAL HIGH (ref 5–15)
BUN: 71 mg/dL — ABNORMAL HIGH (ref 6–20)
CO2: 19 mmol/L — ABNORMAL LOW (ref 22–32)
Calcium: 11.4 mg/dL — ABNORMAL HIGH (ref 8.9–10.3)
Chloride: 103 mmol/L (ref 98–111)
Creatinine, Ser: 15.58 mg/dL — ABNORMAL HIGH (ref 0.61–1.24)
GFR, Estimated: 3 mL/min — ABNORMAL LOW (ref >60)
Glucose, Bld: 86 mg/dL (ref 70–99)
Potassium: 5.2 mmol/L — ABNORMAL HIGH (ref 3.5–5.1)
Sodium: 139 mmol/L (ref 135–145)
Total Bilirubin: 0.6 mg/dL (ref 0.3–1.2)
Total Protein: 6.9 g/dL (ref 6.5–8.1)

## 2023-04-02 LAB — BASIC METABOLIC PANEL
Anion gap: 12 (ref 5–15)
BUN: 77 mg/dL — ABNORMAL HIGH (ref 6–20)
CO2: 25 mmol/L (ref 22–32)
Calcium: 8.3 mg/dL — ABNORMAL LOW (ref 8.9–10.3)
Chloride: 101 mmol/L (ref 98–111)
Creatinine, Ser: 17.86 mg/dL — ABNORMAL HIGH (ref 0.61–1.24)
GFR, Estimated: 3 mL/min — ABNORMAL LOW (ref >60)
Glucose, Bld: 122 mg/dL — ABNORMAL HIGH (ref 70–99)
Potassium: 6.1 mmol/L — ABNORMAL HIGH (ref 3.5–5.1)
Sodium: 138 mmol/L (ref 135–145)

## 2023-04-02 LAB — PHOSPHORUS: Phosphorus: 5.3 mg/dL — ABNORMAL HIGH (ref 2.5–4.6)

## 2023-04-02 LAB — I-STAT CHEM 8, ED
BUN: 75 mg/dL — ABNORMAL HIGH (ref 6–20)
Calcium, Ion: 0.96 mmol/L — ABNORMAL LOW (ref 1.15–1.40)
Chloride: 107 mmol/L (ref 98–111)
Creatinine, Ser: >18 mg/dL — ABNORMAL HIGH (ref 0.61–1.24)
Glucose, Bld: 120 mg/dL — ABNORMAL HIGH (ref 70–99)
HCT: 31 % — ABNORMAL LOW (ref 39.0–52.0)
Hemoglobin: 10.5 g/dL — ABNORMAL LOW (ref 13.0–17.0)
Potassium: 6.1 mmol/L — ABNORMAL HIGH (ref 3.5–5.1)
Sodium: 139 mmol/L (ref 135–145)
TCO2: 22 mmol/L (ref 22–32)

## 2023-04-02 LAB — TROPONIN I (HIGH SENSITIVITY)
Troponin I (High Sensitivity): 106 ng/L (ref <18)
Troponin I (High Sensitivity): 95 ng/L — ABNORMAL HIGH (ref <18)
Troponin I (High Sensitivity): 101 ng/L (ref <18)

## 2023-04-02 LAB — HEPATITIS B SURFACE ANTIGEN: Hepatitis B Surface Ag: NONREACTIVE

## 2023-04-02 LAB — RESP PANEL BY RT-PCR (RSV, FLU A&B, COVID)  RVPGX2
Influenza A by PCR: NEGATIVE
Influenza B by PCR: NEGATIVE
Resp Syncytial Virus by PCR: NEGATIVE
SARS Coronavirus 2 by RT PCR: NEGATIVE

## 2023-04-02 LAB — MAGNESIUM: Magnesium: 1.4 mg/dL — ABNORMAL LOW (ref 1.7–2.4)

## 2023-04-02 LAB — CALCIUM: Calcium: 8.3 mg/dL — ABNORMAL LOW (ref 8.9–10.3)

## 2023-04-02 MED ORDER — CARVEDILOL 25 MG PO TABS
25.0000 mg | ORAL_TABLET | Freq: Two times a day (BID) | ORAL | Status: DC
  Administered 2023-04-02 – 2023-04-03 (×2): 25 mg via ORAL
  Filled 2023-04-02: qty 2
  Filled 2023-04-02: qty 1

## 2023-04-02 MED ORDER — ONDANSETRON HCL 4 MG/2ML IJ SOLN: 4.0000 mg | Freq: Four times a day (QID) | INTRAMUSCULAR | Status: DC | PRN

## 2023-04-02 MED ORDER — CALCIUM ACETATE (PHOS BINDER) 667 MG PO CAPS
1334.0000 mg | ORAL_CAPSULE | Freq: Three times a day (TID) | ORAL | Status: DC
  Administered 2023-04-02 – 2023-04-03 (×2): 1334 mg via ORAL
  Filled 2023-04-02 (×3): qty 2

## 2023-04-02 MED ORDER — OXYCODONE HCL 5 MG PO TABS: 5.0000 mg | ORAL_TABLET | ORAL | Status: DC | PRN

## 2023-04-02 MED ORDER — ACETAMINOPHEN 650 MG RE SUPP: 650.0000 mg | Freq: Four times a day (QID) | RECTAL | Status: DC | PRN

## 2023-04-02 MED ORDER — MORPHINE SULFATE (PF) 2 MG/ML IV SOLN
2.0000 mg | INTRAVENOUS | Status: DC | PRN
  Administered 2023-04-02: 2 mg via INTRAVENOUS
  Filled 2023-04-02: qty 1

## 2023-04-02 MED ORDER — ALBUTEROL SULFATE (2.5 MG/3ML) 0.083% IN NEBU: 2.5000 mg | INHALATION_SOLUTION | RESPIRATORY_TRACT | Status: DC | PRN

## 2023-04-02 MED ORDER — NITROGLYCERIN 0.4 MG SL SUBL
0.4000 mg | SUBLINGUAL_TABLET | SUBLINGUAL | Status: DC | PRN
  Administered 2023-04-02: 0.4 mg via SUBLINGUAL
  Filled 2023-04-02: qty 1

## 2023-04-02 MED ORDER — ONDANSETRON HCL 4 MG PO TABS: 4.0000 mg | ORAL_TABLET | Freq: Four times a day (QID) | ORAL | Status: DC | PRN

## 2023-04-02 MED ORDER — SODIUM ZIRCONIUM CYCLOSILICATE 10 G PO PACK
10.0000 g | PACK | Freq: Once | ORAL | Status: AC
  Administered 2023-04-02: 10 g via ORAL
  Filled 2023-04-02: qty 1

## 2023-04-02 MED ORDER — ACETAMINOPHEN 325 MG PO TABS
650.0000 mg | ORAL_TABLET | Freq: Four times a day (QID) | ORAL | Status: DC | PRN
  Administered 2023-04-02: 650 mg via ORAL
  Filled 2023-04-02: qty 2

## 2023-04-02 MED ORDER — FAMOTIDINE 20 MG PO TABS: 20.0000 mg | ORAL_TABLET | Freq: Two times a day (BID) | ORAL | Status: DC | PRN

## 2023-04-02 MED ORDER — FERRIC CITRATE 1 GM 210 MG(FE) PO TABS
420.0000 mg | ORAL_TABLET | Freq: Every day | ORAL | Status: DC
  Filled 2023-04-02: qty 2

## 2023-04-02 MED ORDER — PANTOPRAZOLE SODIUM 40 MG PO TBEC
40.0000 mg | DELAYED_RELEASE_TABLET | Freq: Every day | ORAL | Status: DC
  Administered 2023-04-02 – 2023-04-03 (×2): 40 mg via ORAL
  Filled 2023-04-02 (×2): qty 1

## 2023-04-02 MED ORDER — CALCIUM GLUCONATE 10 % IV SOLN
1.0000 g | Freq: Once | INTRAVENOUS | Status: AC
  Administered 2023-04-02: 1 g via INTRAVENOUS
  Filled 2023-04-02: qty 10

## 2023-04-02 MED ORDER — IRBESARTAN 75 MG PO TABS
150.0000 mg | ORAL_TABLET | Freq: Every day | ORAL | Status: DC
  Administered 2023-04-02 – 2023-04-03 (×2): 150 mg via ORAL
  Filled 2023-04-02: qty 2
  Filled 2023-04-02: qty 1

## 2023-04-02 MED ORDER — HEPARIN SODIUM (PORCINE) 5000 UNIT/ML IJ SOLN
5000.0000 [IU] | Freq: Three times a day (TID) | INTRAMUSCULAR | Status: DC
  Administered 2023-04-02 – 2023-04-03 (×3): 5000 [IU] via SUBCUTANEOUS
  Filled 2023-04-02 (×5): qty 1

## 2023-04-02 MED ORDER — ATORVASTATIN CALCIUM 80 MG PO TABS
80.0000 mg | ORAL_TABLET | Freq: Every day | ORAL | Status: DC
  Administered 2023-04-02 – 2023-04-03 (×2): 80 mg via ORAL
  Filled 2023-04-02 (×2): qty 1

## 2023-04-02 MED ORDER — CHLORHEXIDINE GLUCONATE CLOTH 2 % EX PADS
6.0000 | MEDICATED_PAD | Freq: Every day | CUTANEOUS | Status: DC
  Administered 2023-04-03: 6 via TOPICAL

## 2023-04-02 MED ORDER — ASPIRIN 81 MG PO TBEC
81.0000 mg | DELAYED_RELEASE_TABLET | Freq: Every morning | ORAL | Status: DC
  Administered 2023-04-02 – 2023-04-03 (×2): 81 mg via ORAL
  Filled 2023-04-02 (×2): qty 1

## 2023-04-02 MED ORDER — GABAPENTIN 100 MG PO CAPS
100.0000 mg | ORAL_CAPSULE | Freq: Two times a day (BID) | ORAL | Status: DC
  Administered 2023-04-02 – 2023-04-03 (×3): 100 mg via ORAL
  Filled 2023-04-02 (×3): qty 1

## 2023-04-02 MED ORDER — FUROSEMIDE 10 MG/ML IJ SOLN
60.0000 mg | Freq: Once | INTRAMUSCULAR | Status: AC
  Administered 2023-04-02: 60 mg via INTRAVENOUS
  Filled 2023-04-02: qty 6

## 2023-04-02 MED ORDER — PENTAFLUOROPROP-TETRAFLUOROETH EX AERO: 1.0000 | INHALATION_SPRAY | CUTANEOUS | Status: DC | PRN

## 2023-04-02 NOTE — Assessment & Plan Note (Addendum)
-   Continue carvedilol and olmesartan - With hypertensive urgency blood pressures up to 200/101 - Blood pressure likely to improve with dialysis

## 2023-04-02 NOTE — Assessment & Plan Note (Signed)
-   Monday Wednesday Friday dialysis - Last dialysis was on October 2nd - Nephrology consulted - Hyperkalemic at 6.1 - Lokelma and calcium gluconate given in the ED - Defer further management of potassium to nephrology - Patient does still make urine so a dose of Lasix will be given - Continue home PhosLo - Continue to monitor

## 2023-04-02 NOTE — ED Triage Notes (Signed)
Pt  here from home with c/o sob and chest pain missed dialysis on Friday  received 324mg  asa and 1 nitroglycerin

## 2023-04-02 NOTE — Assessment & Plan Note (Signed)
Continue statin. 

## 2023-04-02 NOTE — Assessment & Plan Note (Signed)
-   Controlled - Glucose in the ED 122 - Continue to monitor

## 2023-04-02 NOTE — ED Provider Notes (Signed)
[place] EMERGENCY DEPARTMENT AT [facility] Provider Note   CSN: [id number] Arrival date & time: 04/02/23  0004     History  Chief Complaint  Patient presents with   Shortness of Breath    Arvo Ealy is a 52 y.o. male.  The history is provided by the patient and medical records.  Shortness of Breath Associated symptoms: chest pain    52 year old male with history of congestive heart failure, diabetes, ESRD on HD (MWF), hyperlipidemia, hypertension, thrombocytopenia, presenting to the ED with chest pain and shortness of breath that began today.  He states he was just sitting at home and began feeling unwell.  He did miss his dialysis session on Friday because he missed his ride to the clinic.  States he tries not to miss but this was unavoidable.  He denies any cough or fever, has temp of 100.62F on arrival to ED.  He did receive aspirin and nitroglycerin with EMS.  Currently needing 2L, generally not O2 dependent.  Home Medications Prior to Admission medications   Medication Sig Start Date End Date Taking? Authorizing Provider  acetaminophen (TYLENOL) 325 MG tablet Take 2 tablets (650 mg total) by mouth every 6 (six) hours as needed for up to 30 doses for moderate pain or mild pain. 12/22/22   Terald Sleeper, MD  aspirin EC 81 MG tablet Take 1 tablet (81 mg total) by mouth daily. Swallow whole. Patient taking differently: Take 81 mg by mouth in the morning. Swallow whole. 12/14/22   Modena Slater, DO  atorvastatin (LIPITOR) 80 MG tablet Take 1 tablet (80 mg total) by mouth daily. 12/15/22   Modena Slater, DO  calcium acetate (PHOSLO) 667 MG capsule Take 2 capsules (1,334 mg total) by mouth with breakfast, with lunch, and with evening meal. Patient taking differently: Take 2,001 mg by mouth with breakfast, with lunch, and with evening meal. 12/14/22   Modena Slater, DO  carvedilol (COREG) 25 MG tablet Take 1 tablet (25 mg total) by mouth 2 (two) times daily with a meal.  12/14/22   Modena Slater, DO  famotidine (PEPCID) 20 MG tablet Take 1 tablet (20 mg total) by mouth 2 (two) times daily as needed for heartburn or indigestion. 02/09/23   Edwin Dada P, DO  ferric citrate (AURYXIA) 1 GM 210 MG(Fe) tablet Take 2 tablets (420 mg total) by mouth daily. 12/14/22   Modena Slater, DO  gabapentin (NEURONTIN) 100 MG capsule Take 1 capsule (100 mg total) by mouth 3 (three) times daily. Patient taking differently: Take 100 mg by mouth 2 (two) times daily. 12/14/22   Modena Slater, DO  Multiple Vitamins-Minerals (CERTAVITE/ANTIOXIDANTS) TABS Take 1 tablet by mouth at bedtime. 10/20/22   Tiffany Kocher, DO  multivitamin (RENA-VIT) TABS tablet Take 1 tablet by mouth daily. 11/22/22   [provider]  nitroGLYCERIN (NITROSTAT) 0.4 MG SL tablet Place 0.4 mg under the tongue every 5 (five) minutes as needed for chest pain. 12/19/22   [provider]  olmesartan (BENICAR) 20 MG tablet Take 1 tablet (20 mg total) by mouth daily. 12/14/22   Modena Slater, DO  omeprazole (PRILOSEC) 20 MG capsule Take 1 capsule (20 mg total) by mouth daily. 02/09/23   Edwin Dada P, DO  ondansetron (ZOFRAN-ODT) 4 MG disintegrating tablet Take 1 tablet (4 mg total) by mouth every 8 (eight) hours as needed for up to 12 doses for nausea or vomiting. 12/22/22   Terald Sleeper, MD  pantoprazole (PROTONIX) 40 MG tablet Take  1 tablet (40 mg total) by mouth daily. 12/14/22 02/13/23  Modena Slater, DO      Allergies    Patient has no known allergies.    Review of Systems   Review of Systems  Respiratory:  Positive for shortness of breath.   Cardiovascular:  Positive for chest pain.  All other systems reviewed and are negative.   Physical Exam Updated Vital Signs BP (!) 200/98 (BP Location: Left Arm)   Pulse 92   Temp 100.1 F (37.8 C)   Resp 19   SpO2 91%   Physical Exam Vitals and nursing note reviewed.  Constitutional:      Appearance: He is well-developed.  HENT:     Head: Normocephalic  and atraumatic.  Eyes:     Conjunctiva/sclera: Conjunctivae normal.     Pupils: Pupils are equal, round, and reactive to light.  Cardiovascular:     Rate and Rhythm: Normal rate and regular rhythm.     Heart sounds: Normal heart sounds.  Pulmonary:     Effort: Pulmonary effort is normal.     Breath sounds: Normal breath sounds.  Abdominal:     General: Bowel sounds are normal.     Palpations: Abdomen is soft.  Musculoskeletal:        General: Normal range of motion.     Cervical back: Normal range of motion.  Skin:    General: Skin is warm and dry.  Neurological:     Mental Status: He is alert and oriented to person, place, and time.     ED Results / Procedures / Treatments   Labs (all labs ordered are listed, but only abnormal results are displayed) Labs Reviewed  CBC WITH DIFFERENTIAL/PLATELET - Abnormal; Notable for the following components:      Result Value   RBC 3.46 (*)    Hemoglobin 10.6 (*)    HCT 32.8 (*)    RDW 18.2 (*)    Platelets 148 (*)    All other components within normal limits  BASIC METABOLIC PANEL - Abnormal; Notable for the following components:   Potassium 6.1 (*)    Glucose, Bld 122 (*)    BUN 77 (*)    Creatinine, Ser 17.86 (*)    Calcium 8.3 (*)    GFR, Estimated 3 (*)    All other components within normal limits  I-STAT CHEM 8, ED - Abnormal; Notable for the following components:   Potassium 6.1 (*)    BUN 75 (*)    Creatinine, Ser >18.00 (*)    Glucose, Bld 120 (*)    Calcium, Ion 0.96 (*)    Hemoglobin 10.5 (*)    HCT 31.0 (*)    All other components within normal limits  TROPONIN I (HIGH SENSITIVITY) - Abnormal; Notable for the following components:   Troponin I (High Sensitivity) 106 (*)    All other components within normal limits  TROPONIN I (HIGH SENSITIVITY) - Abnormal; Notable for the following components:   Troponin I (High Sensitivity) 95 (*)    All other components within normal limits  RESP PANEL BY RT-PCR (RSV, FLU  A&B, COVID)  RVPGX2  HEPATITIS B SURFACE ANTIGEN  HEPATITIS B SURFACE ANTIBODY, QUANTITATIVE    EKG None  Radiology DG Chest 2 View  Result Date: 04/02/2023 CLINICAL DATA:  Shortness of breath. Missed hemodialysis. Chest pain. EXAM: CHEST - 2 VIEW COMPARISON:  Radiograph and CT 02/13/2023 FINDINGS: Stable cardiomegaly. Aortic atherosclerotic calcification. Pulmonary vascular congestion. Interstitial coarsening in the lower  lungs suggestive both edema. No pleural effusion or pneumothorax. IMPRESSION: Cardiomegaly with mild interstitial edema. Electronically Signed   By: Minerva Fester M.D.   On: 04/02/2023 03:12    Procedures Procedures    CRITICAL CARE Performed by: Garlon Hatchet   Total critical care time: 45 minutes  Critical care time was exclusive of separately billable procedures and treating other patients.  Critical care was necessary to treat or prevent imminent or life-threatening deterioration.  Critical care was time spent personally by me on the following activities: development of treatment plan with patient and/or surrogate as well as nursing, discussions with consultants, evaluation of patient's response to treatment, examination of patient, obtaining history from patient or surrogate, ordering and performing treatments and interventions, ordering and review of laboratory studies, ordering and review of radiographic studies, pulse oximetry and re-evaluation of patient's condition.   Medications Ordered in ED Medications - No data to display  ED Course/ Medical Decision Making/ A&P                                 Medical Decision Making Amount and/or Complexity of Data Reviewed Labs: ordered. Radiology: ordered and independent interpretation performed. ECG/medicine tests: ordered and independent interpretation performed.  Risk Prescription drug management. Decision regarding hospitalization.   52 year old male presenting to the ED with chest pain and  shortness of breath.  Missed dialysis session on Friday as he did not have a ride to clinic.  He is hypertensive here and currently requiring supplemental O2, generally not oxygen dependent.  He is not acutely distressed.  EKG does have some peaked T waves.  Concern for possible hyperkalemia.  I-STAT Chem-8 with potassium of 6.1, unclear if any hemolysis.  Will await formal lab values.  Potassium elevated at 6.1 without hemolyisis noted.  Does have some peaked t-waves on EKG. Troponin is 106, similar values compared with prior.  No active chest pain. CXR with pulmonary edema.  Given urgent K+ reversal with lokelma and calcium gluconate.  Will discuss with neprology, he will need admission.  Spoke with on call nephrology, Dr. Arlean Hopping-- they will evaluate and arrange for treatment in the AM.  Agrees with temporizing measures for now.  Discussed with hospitalist, Dr. Carren Rang-- will admit for ongoing care.  Final Clinical Impression(s) / ED Diagnoses Final diagnoses:  Hyperkalemia  ESRD (end stage renal disease) (HCC)  Hypervolemia, unspecified hypervolemia type    Rx / DC Orders ED Discharge Orders     None         Garlon Hatchet, PA-C 04/02/23 0406    Tilden Fossa, MD 04/02/23 [phone]

## 2023-04-02 NOTE — Procedures (Signed)
I was present at this dialysis session, have reviewed the session and made  appropriate changes Vinson Moselle MD  [facility] 04/02/2023, 10:23 AM

## 2023-04-02 NOTE — ED Notes (Signed)
ED TO INPATIENT HANDOFF REPORT  ED Nurse Name and Phone #: Marchelle Folks RN   S Name/Age/Gender Corey Park 52 y.o. male Room/Bed: 038C/038C  Code Status   Code Status: Full Code  Home/SNF/Other Home Patient oriented to: self, place, time, and situation Is this baseline? Yes   Triage Complete: Triage complete  Chief Complaint Hyperkalemia [E87.5] ESRD (end stage renal disease) (HCC) [N18.6] Acute respiratory failure with hypoxia (HCC) [J96.01] Hypervolemia, unspecified hypervolemia type [E87.70]  Triage Note Pt  here from home with c/o sob and chest pain missed dialysis on Friday  received 324mg  asa and 1 nitroglycerin    Allergies No Known Allergies  Level of Care/Admitting Diagnosis ED Disposition     ED Disposition  Admit   Condition  --   Comment  Hospital Area: [facility] [[id number]]  Level of Care: Telemetry Medical [104]  May place patient in observation at [facility] or [facility] if equivalent level of care is available:: No  Covid Evaluation: Confirmed COVID Negative  Diagnosis: Acute respiratory failure with hypoxia [facility]) [[id number]]  Admitting Physician: Lilyan Gilford [[id number]]  Attending Physician: Lilyan Gilford [[id number]]          B Medical/Surgery History Past Medical History:  Diagnosis Date   Allergy, unspecified, initial encounter 08/25/2019   Asthma    as a child   Cellulitis, perineum 11/26/2019   CHF (congestive heart failure) (HCC) 2021   Complication of vascular dialysis catheter 08/25/2019   COVID-19 virus infection 07/27/2019   Diabetes mellitus without complication (HCC)    type 2   Dilated cardiomyopathy (HCC) 07/03/2018   ESRD on hemodialysis (HCC) 06/17/2018   MWF at [facility]   Fe deficiency anemia 12/23/2018   Hypertension    Legally blind    B/L   Pneumonia 2021   Type 2 diabetes mellitus with diabetic peripheral angiopathy without gangrene (HCC) 08/25/2019   Unspecified protein-calorie  malnutrition (HCC) 08/25/2019   Past Surgical History:  Procedure Laterality Date   BASCILIC VEIN TRANSPOSITION Right 10/16/2019   Procedure: Basilic Vein Transposition Right Arm;  Surgeon: Nada Libman, MD;  Location: [facility] OR;  Service: Vascular;  Laterality: Right;   BASCILIC VEIN TRANSPOSITION Right 01/29/2020   Procedure: RIGHT ARM SECOND STAGE BASCILIC VEIN TRANSPOSITION;  Surgeon: Nada Libman, MD;  Location: [facility] OR;  Service: Vascular;  Laterality: Right;   BIOPSY  12/22/2018   Procedure: BIOPSY;  Surgeon: Carman Ching, MD;  Location: [facility] ENDOSCOPY;  Service: Endoscopy;;   CORONARY ULTRASOUND/IVUS N/A 12/13/2022   Procedure: Coronary Ultrasound/IVUS;  Surgeon: Corky Crafts, MD;  Location: [facility] INVASIVE CV LAB;  Service: Cardiovascular;  Laterality: N/A;   ESOPHAGOGASTRODUODENOSCOPY N/A 12/22/2018   Procedure: ESOPHAGOGASTRODUODENOSCOPY (EGD);  Surgeon: Carman Ching, MD;  Location: [facility] ENDOSCOPY;  Service: Endoscopy;  Laterality: N/A;   IR FLUORO GUIDE CV LINE RIGHT  07/29/2019   IR US GUIDE VASC ACCESS RIGHT  07/29/2019   LEFT HEART CATH AND CORONARY ANGIOGRAPHY N/A 12/13/2022   Procedure: LEFT HEART CATH AND CORONARY ANGIOGRAPHY;  Surgeon: Corky Crafts, MD;  Location: [facility] INVASIVE CV LAB;  Service: Cardiovascular;  Laterality: N/A;     A IV Location/Drains/Wounds Patient Lines/Drains/Airways Status     Active Line/Drains/Airways     Name Placement date Placement time Site Days   Peripheral IV 04/02/23 20 G Anterior;Left;Proximal Forearm 04/02/23  0207  Forearm  less than 1   Fistula / Graft Right Upper arm Arteriovenous fistula 10/16/19  0833  Upper arm  1264   Wound / Incision (Open or Dehisced) 10/19/22 Laceration Lip Right 10/19/22  2100  Lip  165            Intake/Output Last 24 hours  Intake/Output Summary (Last 24 hours) at 04/02/2023 1520 Last data filed at 04/02/2023 1252 Gross per 24 hour  Intake --  Output 4000 ml  Net -4000 ml     Labs/Imaging Results for orders placed or performed during the hospital encounter of 04/02/23 (from the past 48 hour(s))  CBC with Differential     Status: Abnormal   Collection Time: 04/02/23 12:24 AM  Result Value Ref Range   WBC 8.1 4.0 - 10.5 K/uL   RBC 3.46 (L) 4.22 - 5.81 MIL/uL   Hemoglobin 10.6 (L) 13.0 - 17.0 g/dL   HCT 16.1 (L) 09.6 - 04.5 %   MCV 94.8 80.0 - 100.0 fL   MCH 30.6 26.0 - 34.0 pg   MCHC 32.3 30.0 - 36.0 g/dL   RDW 40.9 (H) 81.1 - 91.4 %   Platelets 148 (L) 150 - 400 K/uL   nRBC 0.0 0.0 - 0.2 %   Neutrophils Relative % 75 %   Neutro Abs 6.0 1.7 - 7.7 K/uL   Lymphocytes Relative 12 %   Lymphs Abs 1.0 0.7 - 4.0 K/uL   Monocytes Relative 9 %   Monocytes Absolute 0.8 0.1 - 1.0 K/uL   Eosinophils Relative 4 %   Eosinophils Absolute 0.3 0.0 - 0.5 K/uL   Basophils Relative 0 %   Basophils Absolute 0.0 0.0 - 0.1 K/uL   Immature Granulocytes 0 %   Abs Immature Granulocytes 0.03 0.00 - 0.07 K/uL    Comment: Performed at [facility], [address] [place], [place] [address]  Basic metabolic panel     Status: Abnormal   Collection Time: 04/02/23 12:24 AM  Result Value Ref Range   Sodium 138 135 - 145 mmol/L   Potassium 6.1 (H) 3.5 - 5.1 mmol/L   Chloride 101 98 - 111 mmol/L   CO2 25 22 - 32 mmol/L   Glucose, Bld 122 (H) 70 - 99 mg/dL    Comment: Glucose reference range applies only to samples taken after fasting for at least 8 hours.   BUN 77 (H) 6 - 20 mg/dL   Creatinine, Ser 62.13 (H) 0.61 - 1.24 mg/dL   Calcium 8.3 (L) 8.9 - 10.3 mg/dL   GFR, Estimated 3 (L) >60 mL/min    Comment: (NOTE) Calculated using the CKD-EPI Creatinine Equation (2021)    Anion gap 12 5 - 15    Comment: Performed at [facility], [address], [place], [place] [address]  Troponin I (High Sensitivity)     Status: Abnormal   Collection Time: 04/02/23 12:24 AM  Result Value Ref Range   Troponin I (High Sensitivity) 106 (HH) <18 ng/L    Comment: CRITICAL RESULT  CALLED TO, READ BACK BY AND VERIFIED WITH M.ROBERTS RN 0130 04/02/2023 BY G.GANADEN (NOTE) Elevated high sensitivity troponin I (hsTnI) values and significant  changes across serial measurements may suggest ACS but many other  chronic and acute conditions are known to elevate hsTnI results.  Refer to the "Links" section for chest pain algorithms and additional  guidance. Performed at [facility], [address], [place], [place] [address]   I-stat chem 8, ED (not at [facility], [facility] or [facility])     Status: Abnormal   Collection Time: 04/02/23 12:32 AM  Result Value Ref Range   Sodium 139 135 - 145 mmol/L   Potassium 6.1 (H) 3.5 - 5.1 mmol/L   Chloride 107 98 - 111 mmol/L   BUN 75 (H) 6 - 20 mg/dL   Creatinine, Ser >16.10 (H) 0.61 - 1.24 mg/dL   Glucose, Bld 960 (H) 70 - 99 mg/dL    Comment: Glucose reference range applies only to samples taken after fasting for at least 8 hours.   Calcium, Ion 0.96 (L) 1.15 - 1.40 mmol/L   TCO2 22 22 - 32 mmol/L   Hemoglobin 10.5 (L) 13.0 - 17.0 g/dL   HCT 45.4 (L) 09.8 - 11.9 %  Resp panel by RT-PCR (RSV, Flu A&B, Covid) Anterior Nasal Swab     Status: None   Collection Time: 04/02/23  1:16 AM   Specimen: Anterior Nasal Swab  Result Value Ref Range   SARS Coronavirus 2 by RT PCR NEGATIVE NEGATIVE   Influenza A by PCR NEGATIVE NEGATIVE   Influenza B by PCR NEGATIVE NEGATIVE    Comment: (NOTE) The Xpert Xpress SARS-CoV-2/FLU/RSV plus assay is intended as an aid in the diagnosis of influenza from Nasopharyngeal swab specimens and should not be used as a sole basis for treatment. Nasal washings and aspirates are unacceptable for Xpert Xpress SARS-CoV-2/FLU/RSV testing.  Fact Sheet for Patients: BloggerCourse.com  Fact Sheet for Healthcare Providers: SeriousBroker.it  This test is not yet approved or cleared by the [place] FDA and has been authorized for detection and/or diagnosis of  SARS-CoV-2 by FDA under an Emergency Use Authorization (EUA). This EUA will remain in effect (meaning this test can be used) for the duration of the COVID-19 declaration under Section 564(b)(1) of the Act, 21 U.S.C. section 360bbb-3(b)(1), unless the authorization is terminated or revoked.     Resp Syncytial Virus by PCR NEGATIVE NEGATIVE    Comment: (NOTE) Fact Sheet for Patients: BloggerCourse.com  Fact Sheet for Healthcare Providers: SeriousBroker.it  This test is not yet approved or cleared by the [place] FDA and has been authorized for detection and/or diagnosis of SARS-CoV-2 by FDA under an Emergency Use Authorization (EUA). This EUA will remain in effect (meaning this test can be used) for the duration of the COVID-19 declaration under Section 564(b)(1) of the Act, 21 U.S.C. section 360bbb-3(b)(1), unless the authorization is terminated or revoked.  Performed at [facility], [address] [place], [place] [address]   Troponin I (High Sensitivity)     Status: Abnormal   Collection Time: 04/02/23  2:10 AM  Result Value Ref Range   Troponin I (High Sensitivity) 95 (H) <18 ng/L    Comment: (NOTE) Elevated high sensitivity troponin I (hsTnI) values and significant  changes across serial measurements may suggest ACS but many other  chronic and acute conditions are known to elevate hsTnI results.  Refer to the "Links" section for chest pain algorithms and additional  guidance. Performed at [facility], [address] [place], [place] [address]   Hepatitis B surface antigen     Status: None   Collection Time: 04/02/23  2:10 AM  Result Value Ref Range   Hepatitis B Surface Ag NON REACTIVE NON REACTIVE    Comment: Performed at [facility], [address] [place], [place] [address]  Comprehensive metabolic panel     Status: Abnormal   Collection Time: 04/02/23  5:55 AM  Result Value Ref Range    Sodium 139 135 - 145 mmol/L   Potassium  5.2 (H) 3.5 - 5.1 mmol/L   Chloride 103 98 - 111 mmol/L   CO2 19 (L) 22 - 32 mmol/L   Glucose, Bld 86 70 - 99 mg/dL    Comment: Glucose reference range applies only to samples taken after fasting for at least 8 hours.   BUN 71 (H) 6 - 20 mg/dL   Creatinine, Ser 16.10 (H) 0.61 - 1.24 mg/dL   Calcium 96.0 (H) 8.9 - 10.3 mg/dL   Total Protein 6.9 6.5 - 8.1 g/dL   Albumin 3.0 (L) 3.5 - 5.0 g/dL   AST 23 15 - 41 U/L   ALT 13 0 - 44 U/L   Alkaline Phosphatase 49 38 - 126 U/L   Total Bilirubin 0.6 0.3 - 1.2 mg/dL   GFR, Estimated 3 (L) >60 mL/min    Comment: (NOTE) Calculated using the CKD-EPI Creatinine Equation (2021)    Anion gap 17 (H) 5 - 15    Comment: Performed at [facility], [address] [place], [place] [address]  Magnesium     Status: Abnormal   Collection Time: 04/02/23  5:55 AM  Result Value Ref Range   Magnesium 1.4 (L) 1.7 - 2.4 mg/dL    Comment: Performed at [facility], [address], [place], [place] [address]  Phosphorus     Status: Abnormal   Collection Time: 04/02/23  5:55 AM  Result Value Ref Range   Phosphorus 5.3 (H) 2.5 - 4.6 mg/dL    Comment: Performed at [facility], [address] [place], [place] [address]  CBC with Differential/Platelet     Status: Abnormal   Collection Time: 04/02/23  5:55 AM  Result Value Ref Range   WBC 8.0 4.0 - 10.5 K/uL   RBC 3.07 (L) 4.22 - 5.81 MIL/uL   Hemoglobin 9.5 (L) 13.0 - 17.0 g/dL   HCT 56.2 (L) 13.0 - 86.5 %   MCV 96.7 80.0 - 100.0 fL   MCH 30.9 26.0 - 34.0 pg   MCHC 32.0 30.0 - 36.0 g/dL   RDW 78.4 (H) 69.6 - 29.5 %   Platelets 121 (L) 150 - 400 K/uL   nRBC 0.0 0.0 - 0.2 %   Neutrophils Relative % 77 %   Neutro Abs 6.1 1.7 - 7.7 K/uL   Lymphocytes Relative 11 %   Lymphs Abs 0.9 0.7 - 4.0 K/uL   Monocytes Relative 9 %   Monocytes Absolute 0.7 0.1 - 1.0 K/uL   Eosinophils Relative 3 %   Eosinophils Absolute 0.2 0.0 - 0.5 K/uL   Basophils  Relative 0 %   Basophils Absolute 0.0 0.0 - 0.1 K/uL   Immature Granulocytes 0 %   Abs Immature Granulocytes 0.03 0.00 - 0.07 K/uL    Comment: Performed at [facility], [address], [place], [place] [address]  Troponin I (High Sensitivity)     Status: Abnormal   Collection Time: 04/02/23  5:55 AM  Result Value Ref Range   Troponin I (High Sensitivity) 101 (HH) <18 ng/L    Comment: CRITICAL RESULT CALLED TO, READ BACK BY AND VERIFIED WITH A.Brandy Zuba RN [phone] 04/02/2023 BY G.GANADEN (NOTE) Elevated high sensitivity troponin I (hsTnI) values and significant  changes across serial measurements may suggest ACS but many other  chronic and acute conditions are known to elevate hsTnI results.  Refer to the "Links" section for chest pain algorithms and additional  guidance. Performed at [facility], [address],  [place], [place] [address]    DG Chest 2 View  Result Date: 04/02/2023 CLINICAL DATA:  Shortness of breath. Missed hemodialysis. Chest pain. EXAM: CHEST - 2 VIEW COMPARISON:  Radiograph and CT 02/13/2023 FINDINGS: Stable cardiomegaly. Aortic atherosclerotic calcification. Pulmonary vascular congestion. Interstitial coarsening in the lower lungs suggestive both edema. No pleural effusion or pneumothorax. IMPRESSION: Cardiomegaly with mild interstitial edema. Electronically Signed   By: Minerva Fester M.D.   On: 04/02/2023 03:12    Pending Labs Unresulted Labs (From admission, onward)     Start     Ordered   04/02/23 1030  Calcium  Once,   R        04/02/23 1029   04/02/23 0156  Hepatitis B surface antibody,quantitative  (New Admission Hemo Labs (Hepatitis B))  Once,   URGENT        04/02/23 0157            Vitals/Pain Today's Vitals   04/02/23 1302 04/02/23 1340 04/02/23 1448 04/02/23 1453  BP:   (!) 169/125   Pulse:      Resp:   19   Temp:    98.6 F (37 C)  TempSrc:    Oral  SpO2:   100%   Weight: 96 kg 96 kg    PainSc: 0-No pain        Isolation Precautions No active isolations  Medications Medications  Chlorhexidine Gluconate Cloth 2 % PADS 6 each (has no administration in time range)  aspirin EC tablet 81 mg (has no administration in time range)  atorvastatin (LIPITOR) tablet 80 mg (has no administration in time range)  carvedilol (COREG) tablet 25 mg (25 mg Oral Given 04/02/23 1501)  irbesartan (AVAPRO) tablet 150 mg (has no administration in time range)  calcium acetate (PHOSLO) capsule 1,334 mg (0 mg Oral Hold 04/02/23 1518)  famotidine (PEPCID) tablet 20 mg (has no administration in time range)  ferric citrate (AURYXIA) tablet 420 mg (has no administration in time range)  pantoprazole (PROTONIX) EC tablet 40 mg (has no administration in time range)  gabapentin (NEURONTIN) capsule 100 mg (has no administration in time range)  heparin injection 5,000 Units (5,000 Units Subcutaneous Given 04/02/23 0537)  acetaminophen (TYLENOL) tablet 650 mg (650 mg Oral Given 04/02/23 0526)    Or  acetaminophen (TYLENOL) suppository 650 mg ( Rectal See Alternative 04/02/23 0526)  oxyCODONE (Oxy IR/ROXICODONE) immediate release tablet 5 mg (has no administration in time range)  morphine (PF) 2 MG/ML injection 2 mg (2 mg Intravenous Given 04/02/23 1207)  ondansetron (ZOFRAN) tablet 4 mg (has no administration in time range)    Or  ondansetron (ZOFRAN) injection 4 mg (has no administration in time range)  albuterol (PROVENTIL) (2.5 MG/3ML) 0.083% nebulizer solution 2.5 mg (has no administration in time range)  nitroGLYCERIN (NITROSTAT) SL tablet 0.4 mg (0.4 mg Sublingual Given 04/02/23 0546)  sodium zirconium cyclosilicate (LOKELMA) packet 10 g (10 g Oral Given 04/02/23 0208)  calcium gluconate inj 10% (1 g) URGENT USE ONLY! (1 g Intravenous Given 04/02/23 0207)  furosemide (LASIX) injection 60 mg (60 mg Intravenous Given 04/02/23 0545)    Mobility walks with device     Focused Assessments Renal Assessment Handoff:  Hemodialysis  Schedule: Hemodialysis Schedule: Monday/Wednesday/Friday Last Hemodialysis date and time: today . morning   Restricted appendage: right arm   R Recommendations: See Admitting Provider Note  Report given to:   Additional Notes: pt is blind and is startles easily so announce your presence

## 2023-04-02 NOTE — Assessment & Plan Note (Signed)
-   Last echo showed grade 2 diastolic dysfunction with an ejection fraction of 60-65% in June 2024 - Update echo - Continue aspirin, statin, beta-blocker - 1 dose of Lasix given but fluid management otherwise deferred to nephrology - Nitro for chest pain which will also help with pulmonary edema and blood pressure - Monitor on telemetry

## 2023-04-02 NOTE — ED Notes (Signed)
Consent signed at bedside with RN Marchelle Folks R., due to pt being visually impaired.

## 2023-04-02 NOTE — Progress Notes (Signed)
Patient seen and examined.  Admitted early morning hours by nighttime hospitalist.  He was very upset staying in the hallway all night. 52 year old gentleman with history of ESRD on hemodialysis, Monday Wednesday Friday.  His transportation did not show up Friday morning so he missed hemodialysis.  Last dialysis was Wednesday last week.  Since yesterday, he has been having mild chest discomfort, shortness of breath on mobility so came to the hospital.  Still makes urine. Patient currently looks fairly stable.  He is in need for hemodialysis for fluid removal.  Hopefully renal navigator can help with his transportation difficulties. Likely home after dialysis if doing well. Mild troponin elevation, unlikely acute coronary syndrome.  Recent echocardiogram with ejection fraction 65%.  Patient cardiac cath with nonobstructive disease throughout the left main.  Recommended medical therapy.  Total time spent: 15 minutes.  Same-day admit.  No charge visit.

## 2023-04-02 NOTE — H&P (Signed)
History and Physical    Patient: Corey Park RUE:[id number] DOB: 1971/04/05 DOA: 04/02/2023 DOS: the patient was seen and examined on 04/02/2023 PCP: [name], NP  Patient coming from: Home  Chief Complaint:  Chief Complaint  Patient presents with   Shortness of Breath   HPI: Corey Park is a 52 y.o. male with medical history significant of asthma, CHF, ESRD on HD, dilated cardiomyopathy, hypertension, type 2 diabetes mellitus, and more presents the ED with a chief complaint of dyspnea.  Patient reports he had sudden onset of dyspnea at 7:30 PM on Sunday.  He reports that he was laying down when it started.  He felt chest pain that he describes as pressure.  The pain is reproducible with palpation of left rib for.  Patient reports he had a slight cough that is nonproductive.  He denies orthopnea reporting that when he sat up the pain in the dyspnea did not get any better.  He had no nitro at home to try.  He reports he also had a headache at that time.  He did not take anything for it because he did not have any medications at home.  He had symptoms like this a couple of months ago when he had a cardiac cath that showed nonobstructive coronary artery disease.  Patient reports when he has shortness of breath and chest pain he becomes very weak as well.  He could not walk to the EMS he reports they had to get him from the door of his house and help him to the ambulance.  Patient reports he is nervous about these episodes because his dad died in his sleep of what they think was a heart attack.  Review of systems patient reveals that he missed dialysis on Friday because his ride did not show up.  He reports he makes some urine about 4 times per day.  He has not had any change in his urine output.  At the time exam patient does have chest pain and headache.  Tylenol nitro ordered.  Patient does not smoke and does not drink.  Patient is full code. Review of Systems: As mentioned in the  history of present illness. All other systems reviewed and are negative. Past Medical History:  Diagnosis Date   Allergy, unspecified, initial encounter 08/25/2019   Asthma    as a child   Cellulitis, perineum 11/26/2019   CHF (congestive heart failure) (HCC) 2021   Complication of vascular dialysis catheter 08/25/2019   COVID-19 virus infection 07/27/2019   Diabetes mellitus without complication (HCC)    type 2   Dilated cardiomyopathy (HCC) 07/03/2018   ESRD on hemodialysis (HCC) 06/17/2018   MWF at [facility]   Fe deficiency anemia 12/23/2018   Hypertension    Legally blind    B/L   Pneumonia 2021   Type 2 diabetes mellitus with diabetic peripheral angiopathy without gangrene (HCC) 08/25/2019   Unspecified protein-calorie malnutrition (HCC) 08/25/2019   Past Surgical History:  Procedure Laterality Date   BASCILIC VEIN TRANSPOSITION Right 10/16/2019   Procedure: Basilic Vein Transposition Right Arm;  Surgeon: Nada Libman, MD;  Location: [facility] OR;  Service: Vascular;  Laterality: Right;   BASCILIC VEIN TRANSPOSITION Right 01/29/2020   Procedure: RIGHT ARM SECOND STAGE BASCILIC VEIN TRANSPOSITION;  Surgeon: Nada Libman, MD;  Location: [facility] OR;  Service: Vascular;  Laterality: Right;   BIOPSY  12/22/2018   Procedure: BIOPSY;  Surgeon: Carman Ching, MD;  Location: [facility] ENDOSCOPY;  Service: Endoscopy;;  CORONARY ULTRASOUND/IVUS N/A 12/13/2022   Procedure: Coronary Ultrasound/IVUS;  Surgeon: Corky Crafts, MD;  Location: [facility] INVASIVE CV LAB;  Service: Cardiovascular;  Laterality: N/A;   ESOPHAGOGASTRODUODENOSCOPY N/A 12/22/2018   Procedure: ESOPHAGOGASTRODUODENOSCOPY (EGD);  Surgeon: Carman Ching, MD;  Location: [facility] ENDOSCOPY;  Service: Endoscopy;  Laterality: N/A;   IR FLUORO GUIDE CV LINE RIGHT  07/29/2019   IR US GUIDE VASC ACCESS RIGHT  07/29/2019   LEFT HEART CATH AND CORONARY ANGIOGRAPHY N/A 12/13/2022   Procedure: LEFT HEART CATH AND CORONARY ANGIOGRAPHY;  Surgeon: Corky Crafts, MD;  Location: [facility] INVASIVE CV LAB;  Service: Cardiovascular;  Laterality: N/A;   Social History:  reports that he has never smoked. He has never used smokeless tobacco. He reports that he does not currently use alcohol. He reports that he does not currently use drugs after having used the following drugs: Marijuana.  No Known Allergies  Family History  Problem Relation Age of Onset   CAD Mother    Hypertension Mother    Diabetes Neg Hx    Stroke Neg Hx    Cancer Neg Hx    Kidney failure Neg Hx    Stomach cancer Neg Hx    Colon cancer Neg Hx    Rectal cancer Neg Hx     Prior to Admission medications   Medication Sig Start Date End Date Taking? Authorizing Provider  acetaminophen (TYLENOL) 325 MG tablet Take 2 tablets (650 mg total) by mouth every 6 (six) hours as needed for up to 30 doses for moderate pain or mild pain. 12/22/22   Terald Sleeper, MD  aspirin EC 81 MG tablet Take 1 tablet (81 mg total) by mouth daily. Swallow whole. Patient taking differently: Take 81 mg by mouth in the morning. Swallow whole. 12/14/22   Modena Slater, DO  atorvastatin (LIPITOR) 80 MG tablet Take 1 tablet (80 mg total) by mouth daily. 12/15/22   Modena Slater, DO  calcium acetate (PHOSLO) 667 MG capsule Take 2 capsules (1,334 mg total) by mouth with breakfast, with lunch, and with evening meal. Patient taking differently: Take 2,001 mg by mouth with breakfast, with lunch, and with evening meal. 12/14/22   Modena Slater, DO  carvedilol (COREG) 25 MG tablet Take 1 tablet (25 mg total) by mouth 2 (two) times daily with a meal. 12/14/22   Modena Slater, DO  famotidine (PEPCID) 20 MG tablet Take 1 tablet (20 mg total) by mouth 2 (two) times daily as needed for heartburn or indigestion. 02/09/23   Edwin Dada P, DO  ferric citrate (AURYXIA) 1 GM 210 MG(Fe) tablet Take 2 tablets (420 mg total) by mouth daily. 12/14/22   Modena Slater, DO  gabapentin (NEURONTIN) 100 MG capsule Take 1 capsule (100 mg total) by  mouth 3 (three) times daily. Patient taking differently: Take 100 mg by mouth 2 (two) times daily. 12/14/22   Modena Slater, DO  Multiple Vitamins-Minerals (CERTAVITE/ANTIOXIDANTS) TABS Take 1 tablet by mouth at bedtime. 10/20/22   Tiffany Kocher, DO  multivitamin (RENA-VIT) TABS tablet Take 1 tablet by mouth daily. 11/22/22   [provider]  nitroGLYCERIN (NITROSTAT) 0.4 MG SL tablet Place 0.4 mg under the tongue every 5 (five) minutes as needed for chest pain. 12/19/22   [provider]  olmesartan (BENICAR) 20 MG tablet Take 1 tablet (20 mg total) by mouth daily. 12/14/22   Modena Slater, DO  omeprazole (PRILOSEC) 20 MG capsule Take 1 capsule (20 mg total) by mouth daily. 02/09/23  Edwin Dada P, DO  ondansetron (ZOFRAN-ODT) 4 MG disintegrating tablet Take 1 tablet (4 mg total) by mouth every 8 (eight) hours as needed for up to 12 doses for nausea or vomiting. 12/22/22   Terald Sleeper, MD  pantoprazole (PROTONIX) 40 MG tablet Take 1 tablet (40 mg total) by mouth daily. 12/14/22 02/13/23  Modena Slater, DO    Physical Exam: Vitals:   04/02/23 0159 04/02/23 0200 04/02/23 0327 04/02/23 0530  BP:  (!) 190/101 (!) 188/102 (!) 172/86  Pulse:  84 90   Resp:  20 18   Temp: 98.6 F (37 C)     SpO2:  100% 100%    1.  General: Patient lying supine in bed,  no acute distress   2. Psychiatric: Alert and oriented x 3, mood and behavior normal for situation, pleasant and cooperative with exam   3. Neurologic: Speech and language are normal, face is symmetric, visually impaired, moves all 4 extremities voluntarily, at baseline without acute deficits on limited exam   4. HEENMT:  Head is atraumatic, normocephalic, visually impaired, neck is supple, trachea is midline, mucous membranes are moist   5. Respiratory : Crackles at the bases more on the right than the left, no wheezing, rhonchi,  no cyanosis, no increase in work of breathing or accessory muscle use   6. Cardiovascular  : Heart rate tachycardic, rhythm is regular, no murmurs, rubs or gallops, no peripheral edema, peripheral pulses palpated   7. Gastrointestinal:  Abdomen is soft, nondistended, nontender to palpation bowel sounds active, no masses or organomegaly palpated   8. Skin:  Skin is warm, dry and intact without rashes, acute lesions, or ulcers on limited exam   9.Musculoskeletal:  No acute deformities or trauma, no asymmetry in tone, no peripheral edema, peripheral pulses palpated, no tenderness to palpation in the extremities  Data Reviewed: In the ED Temp 98.6-100.1, heart rate 84-92, respiratory rate 19-20, blood pressure 173/86-200/101, satting 91% on room air, and improving to 100% on 2 L nasal cannula Troponin is downtrending from 106-95 No leukocytosis, hemoglobin 10.6 BUN 77, creatinine of 17.86 and is ESRD patient Negative COVID Chest x-ray shows cardiomegaly with mild interstitial edema EKG shows a heart rate of 91, sinus rhythm, QTc 4 and 67 Patient did have a hyperkalemia at 6.1 and was given calcium for temporizing measure and given Lokelma in the ED Nephrology was consulted and agreed with temporizing measures for hyperkalemia and to evaluate patient in the a.m. Admission requested for volume overload   Assessment and Plan: * Acute respiratory failure with hypoxia (HCC) - Secondary to volume overload - Cardiorenal syndrome - Requiring 2 L nasal cannula - No oxygen requirement at baseline - Chest x-ray shows cardiomegaly with mild interstitial edema - Defer fluid management to nephrology - Wean off O2 as tolerated - Continue to monitor  (HFpEF) heart failure with preserved ejection fraction (HCC) - Last echo showed grade 2 diastolic dysfunction with an ejection fraction of 60-65% in June 2024 - Update echo - Continue aspirin, statin, beta-blocker - 1 dose of Lasix given but fluid management otherwise deferred to nephrology - Nitro for chest pain which will also help  with pulmonary edema and blood pressure - Monitor on telemetry   Elevated troponin - Troponin initially 106 and then 95 - Demand ischemia in the setting of CHF exacerbation - EKG shows a heart rate of 91, sinus rhythm, QTc 467 - T wave inversions/abnormalities present-likely due to demand ischemia, and electrolyte derangements -  Patient did have a cardiac catheterization in June, at which time aspirin 81 mg was recommended for moderate CAD.  Conclusion was that patient had nonobstructive disease throughout the left main, LAD, and circumflex and to continue medical therapy - Nitro as needed for chest pain - Continue to monitor  DM (diabetes mellitus), type 2 (HCC) - Controlled - Glucose in the ED 122 - Continue to monitor  HTN (hypertension) - Continue carvedilol and olmesartan - With hypertensive urgency blood pressures up to 200/101 - Blood pressure likely to improve with dialysis  GERD (gastroesophageal reflux disease) - Continue Pepcid, PPI  ESRD on dialysis [facility]) - Monday Wednesday Friday dialysis - Last dialysis was on October 2nd - Nephrology consulted - Hyperkalemic at 6.1 - Lokelma and calcium gluconate given in the ED - Defer further management of potassium to nephrology - Patient does still make urine so a dose of Lasix will be given - Continue home PhosLo - Continue to monitor  HLD (hyperlipidemia) - Continue statin      Advance Care Planning:   Code Status: Full Code  Consults: Nephrology  Family Communication: No family at bedside  Severity of Illness: The appropriate patient status for this patient is OBSERVATION. Observation status is judged to be reasonable and necessary in order to provide the required intensity of service to ensure the patient's safety. The patient's presenting symptoms, physical exam findings, and initial radiographic and laboratory data in the context of their medical condition is felt to place them at decreased risk for further  clinical deterioration. Furthermore, it is anticipated that the patient will be medically stable for discharge from the hospital within 2 midnights of admission.   Author: Lilyan Gilford, DO 04/02/2023 5:40 AM  For on call review [email or web address].

## 2023-04-02 NOTE — Assessment & Plan Note (Signed)
-   Troponin initially 106 and then 95 - Demand ischemia in the setting of CHF exacerbation - EKG shows a heart rate of 91, sinus rhythm, QTc 467 - T wave inversions/abnormalities present-likely due to demand ischemia, and electrolyte derangements - Patient did have a cardiac catheterization in June, at which time aspirin 81 mg was recommended for moderate CAD.  Conclusion was that patient had nonobstructive disease throughout the left main, LAD, and circumflex and to continue medical therapy - Nitro as needed for chest pain - Continue to monitor

## 2023-04-02 NOTE — Assessment & Plan Note (Signed)
-   Continue Pepcid, PPI

## 2023-04-02 NOTE — ED Notes (Signed)
ED TO INPATIENT HANDOFF REPORT  ED Nurse Name and Phone #: Rodney Booze [phone]  S Name/Age/Gender Corey Park 52 y.o. male Room/Bed: 038C/038C  Code Status   Code Status: Full Code  Home/SNF/Other Home Patient oriented to: self, place, time, and situation Is this baseline? Yes   Triage Complete: Triage complete  Chief Complaint Hyperkalemia [E87.5] ESRD (end stage renal disease) (HCC) [N18.6] Acute respiratory failure with hypoxia (HCC) [J96.01] Hypervolemia, unspecified hypervolemia type [E87.70]  Triage Note Pt  here from home with c/o sob and chest pain missed dialysis on Friday  received 324mg  asa and 1 nitroglycerin    Allergies No Known Allergies  Level of Care/Admitting Diagnosis ED Disposition     ED Disposition  Admit   Condition  --   Comment  Hospital Area: [facility] [[id number]]  Level of Care: Telemetry Medical [104]  May place patient in observation at [facility] or [facility] if equivalent level of care is available:: No  Covid Evaluation: Confirmed COVID Negative  Diagnosis: Acute respiratory failure with hypoxia [facility]) [[id number]]  Admitting Physician: Lilyan Gilford [[id number]]  Attending Physician: Lilyan Gilford [[id number]]          B Medical/Surgery History Past Medical History:  Diagnosis Date   Allergy, unspecified, initial encounter 08/25/2019   Asthma    as a child   Cellulitis, perineum 11/26/2019   CHF (congestive heart failure) (HCC) 2021   Complication of vascular dialysis catheter 08/25/2019   COVID-19 virus infection 07/27/2019   Diabetes mellitus without complication (HCC)    type 2   Dilated cardiomyopathy (HCC) 07/03/2018   ESRD on hemodialysis (HCC) 06/17/2018   MWF at [facility]   Fe deficiency anemia 12/23/2018   Hypertension    Legally blind    B/L   Pneumonia 2021   Type 2 diabetes mellitus with diabetic peripheral angiopathy without gangrene (HCC) 08/25/2019   Unspecified protein-calorie  malnutrition (HCC) 08/25/2019   Past Surgical History:  Procedure Laterality Date   BASCILIC VEIN TRANSPOSITION Right 10/16/2019   Procedure: Basilic Vein Transposition Right Arm;  Surgeon: Nada Libman, MD;  Location: [facility] OR;  Service: Vascular;  Laterality: Right;   BASCILIC VEIN TRANSPOSITION Right 01/29/2020   Procedure: RIGHT ARM SECOND STAGE BASCILIC VEIN TRANSPOSITION;  Surgeon: Nada Libman, MD;  Location: [facility] OR;  Service: Vascular;  Laterality: Right;   BIOPSY  12/22/2018   Procedure: BIOPSY;  Surgeon: Carman Ching, MD;  Location: [facility] ENDOSCOPY;  Service: Endoscopy;;   CORONARY ULTRASOUND/IVUS N/A 12/13/2022   Procedure: Coronary Ultrasound/IVUS;  Surgeon: Corky Crafts, MD;  Location: [facility] INVASIVE CV LAB;  Service: Cardiovascular;  Laterality: N/A;   ESOPHAGOGASTRODUODENOSCOPY N/A 12/22/2018   Procedure: ESOPHAGOGASTRODUODENOSCOPY (EGD);  Surgeon: Carman Ching, MD;  Location: [facility] ENDOSCOPY;  Service: Endoscopy;  Laterality: N/A;   IR FLUORO GUIDE CV LINE RIGHT  07/29/2019   IR US GUIDE VASC ACCESS RIGHT  07/29/2019   LEFT HEART CATH AND CORONARY ANGIOGRAPHY N/A 12/13/2022   Procedure: LEFT HEART CATH AND CORONARY ANGIOGRAPHY;  Surgeon: Corky Crafts, MD;  Location: [facility] INVASIVE CV LAB;  Service: Cardiovascular;  Laterality: N/A;     A IV Location/Drains/Wounds Patient Lines/Drains/Airways Status     Active Line/Drains/Airways     Name Placement date Placement time Site Days   Peripheral IV 04/02/23 20 G Anterior;Left;Proximal Forearm 04/02/23  0207  Forearm  less than 1   Fistula / Graft Right Upper arm Arteriovenous fistula 10/16/19  0833  Upper arm  1264  Wound / Incision (Open or Dehisced) 10/19/22 Laceration Lip Right 10/19/22  2100  Lip  165            Intake/Output Last 24 hours  Intake/Output Summary (Last 24 hours) at 04/02/2023 1602 Last data filed at 04/02/2023 1252 Gross per 24 hour  Intake --  Output 4000 ml  Net -4000 ml     Labs/Imaging Results for orders placed or performed during the hospital encounter of 04/02/23 (from the past 48 hour(s))  CBC with Differential     Status: Abnormal   Collection Time: 04/02/23 12:24 AM  Result Value Ref Range   WBC 8.1 4.0 - 10.5 K/uL   RBC 3.46 (L) 4.22 - 5.81 MIL/uL   Hemoglobin 10.6 (L) 13.0 - 17.0 g/dL   HCT 16.1 (L) 09.6 - 04.5 %   MCV 94.8 80.0 - 100.0 fL   MCH 30.6 26.0 - 34.0 pg   MCHC 32.3 30.0 - 36.0 g/dL   RDW 40.9 (H) 81.1 - 91.4 %   Platelets 148 (L) 150 - 400 K/uL   nRBC 0.0 0.0 - 0.2 %   Neutrophils Relative % 75 %   Neutro Abs 6.0 1.7 - 7.7 K/uL   Lymphocytes Relative 12 %   Lymphs Abs 1.0 0.7 - 4.0 K/uL   Monocytes Relative 9 %   Monocytes Absolute 0.8 0.1 - 1.0 K/uL   Eosinophils Relative 4 %   Eosinophils Absolute 0.3 0.0 - 0.5 K/uL   Basophils Relative 0 %   Basophils Absolute 0.0 0.0 - 0.1 K/uL   Immature Granulocytes 0 %   Abs Immature Granulocytes 0.03 0.00 - 0.07 K/uL    Comment: Performed at [facility], [address], [place], [place] [address]  Basic metabolic panel     Status: Abnormal   Collection Time: 04/02/23 12:24 AM  Result Value Ref Range   Sodium 138 135 - 145 mmol/L   Potassium 6.1 (H) 3.5 - 5.1 mmol/L   Chloride 101 98 - 111 mmol/L   CO2 25 22 - 32 mmol/L   Glucose, Bld 122 (H) 70 - 99 mg/dL    Comment: Glucose reference range applies only to samples taken after fasting for at least 8 hours.   BUN 77 (H) 6 - 20 mg/dL   Creatinine, Ser 62.13 (H) 0.61 - 1.24 mg/dL   Calcium 8.3 (L) 8.9 - 10.3 mg/dL   GFR, Estimated 3 (L) >60 mL/min    Comment: (NOTE) Calculated using the CKD-EPI Creatinine Equation (2021)    Anion gap 12 5 - 15    Comment: Performed at [facility], [address] [place], [place] [address]  Troponin I (High Sensitivity)     Status: Abnormal   Collection Time: 04/02/23 12:24 AM  Result Value Ref Range   Troponin I (High Sensitivity) 106 (HH) <18 ng/L    Comment: CRITICAL RESULT  CALLED TO, READ BACK BY AND VERIFIED WITH M.ROBERTS RN 0130 04/02/2023 BY G.GANADEN (NOTE) Elevated high sensitivity troponin I (hsTnI) values and significant  changes across serial measurements may suggest ACS but many other  chronic and acute conditions are known to elevate hsTnI results.  Refer to the "Links" section for chest pain algorithms and additional  guidance. Performed at [facility], [address], [place], [place] [address]   I-stat chem 8, ED (not at [facility], [facility] or [facility])     Status: Abnormal   Collection Time: 04/02/23 12:32 AM  Result Value Ref  Range   Sodium 139 135 - 145 mmol/L   Potassium 6.1 (H) 3.5 - 5.1 mmol/L   Chloride 107 98 - 111 mmol/L   BUN 75 (H) 6 - 20 mg/dL   Creatinine, Ser >40.98 (H) 0.61 - 1.24 mg/dL   Glucose, Bld 119 (H) 70 - 99 mg/dL    Comment: Glucose reference range applies only to samples taken after fasting for at least 8 hours.   Calcium, Ion 0.96 (L) 1.15 - 1.40 mmol/L   TCO2 22 22 - 32 mmol/L   Hemoglobin 10.5 (L) 13.0 - 17.0 g/dL   HCT 14.7 (L) 82.9 - 56.2 %  Resp panel by RT-PCR (RSV, Flu A&B, Covid) Anterior Nasal Swab     Status: None   Collection Time: 04/02/23  1:16 AM   Specimen: Anterior Nasal Swab  Result Value Ref Range   SARS Coronavirus 2 by RT PCR NEGATIVE NEGATIVE   Influenza A by PCR NEGATIVE NEGATIVE   Influenza B by PCR NEGATIVE NEGATIVE    Comment: (NOTE) The Xpert Xpress SARS-CoV-2/FLU/RSV plus assay is intended as an aid in the diagnosis of influenza from Nasopharyngeal swab specimens and should not be used as a sole basis for treatment. Nasal washings and aspirates are unacceptable for Xpert Xpress SARS-CoV-2/FLU/RSV testing.  Fact Sheet for Patients: BloggerCourse.com  Fact Sheet for Healthcare Providers: SeriousBroker.it  This test is not yet approved or cleared by the [place] FDA and has been authorized for detection and/or diagnosis of  SARS-CoV-2 by FDA under an Emergency Use Authorization (EUA). This EUA will remain in effect (meaning this test can be used) for the duration of the COVID-19 declaration under Section 564(b)(1) of the Act, 21 U.S.C. section 360bbb-3(b)(1), unless the authorization is terminated or revoked.     Resp Syncytial Virus by PCR NEGATIVE NEGATIVE    Comment: (NOTE) Fact Sheet for Patients: BloggerCourse.com  Fact Sheet for Healthcare Providers: SeriousBroker.it  This test is not yet approved or cleared by the [place] FDA and has been authorized for detection and/or diagnosis of SARS-CoV-2 by FDA under an Emergency Use Authorization (EUA). This EUA will remain in effect (meaning this test can be used) for the duration of the COVID-19 declaration under Section 564(b)(1) of the Act, 21 U.S.C. section 360bbb-3(b)(1), unless the authorization is terminated or revoked.  Performed at [facility], [address], [place], [place] [address]   Troponin I (High Sensitivity)     Status: Abnormal   Collection Time: 04/02/23  2:10 AM  Result Value Ref Range   Troponin I (High Sensitivity) 95 (H) <18 ng/L    Comment: (NOTE) Elevated high sensitivity troponin I (hsTnI) values and significant  changes across serial measurements may suggest ACS but many other  chronic and acute conditions are known to elevate hsTnI results.  Refer to the "Links" section for chest pain algorithms and additional  guidance. Performed at [facility], [address], [place], [place] [address]   Hepatitis B surface antigen     Status: None   Collection Time: 04/02/23  2:10 AM  Result Value Ref Range   Hepatitis B Surface Ag NON REACTIVE NON REACTIVE    Comment: Performed at [facility], [address] [place], [place] [address]  Comprehensive metabolic panel     Status: Abnormal   Collection Time: 04/02/23  5:55 AM  Result Value Ref Range    Sodium 139 135 - 145 mmol/L   Potassium 5.2 (H) 3.5 -  5.1 mmol/L   Chloride 103 98 - 111 mmol/L   CO2 19 (L) 22 - 32 mmol/L   Glucose, Bld 86 70 - 99 mg/dL    Comment: Glucose reference range applies only to samples taken after fasting for at least 8 hours.   BUN 71 (H) 6 - 20 mg/dL   Creatinine, Ser 95.62 (H) 0.61 - 1.24 mg/dL   Calcium 13.0 (H) 8.9 - 10.3 mg/dL   Total Protein 6.9 6.5 - 8.1 g/dL   Albumin 3.0 (L) 3.5 - 5.0 g/dL   AST 23 15 - 41 U/L   ALT 13 0 - 44 U/L   Alkaline Phosphatase 49 38 - 126 U/L   Total Bilirubin 0.6 0.3 - 1.2 mg/dL   GFR, Estimated 3 (L) >60 mL/min    Comment: (NOTE) Calculated using the CKD-EPI Creatinine Equation (2021)    Anion gap 17 (H) 5 - 15    Comment: Performed at [facility], [address], [place], [place] [address]  Magnesium     Status: Abnormal   Collection Time: 04/02/23  5:55 AM  Result Value Ref Range   Magnesium 1.4 (L) 1.7 - 2.4 mg/dL    Comment: Performed at [facility], [address], [place], [place] [address]  Phosphorus     Status: Abnormal   Collection Time: 04/02/23  5:55 AM  Result Value Ref Range   Phosphorus 5.3 (H) 2.5 - 4.6 mg/dL    Comment: Performed at [facility], [address] [place], [place] [address]  CBC with Differential/Platelet     Status: Abnormal   Collection Time: 04/02/23  5:55 AM  Result Value Ref Range   WBC 8.0 4.0 - 10.5 K/uL   RBC 3.07 (L) 4.22 - 5.81 MIL/uL   Hemoglobin 9.5 (L) 13.0 - 17.0 g/dL   HCT 13.2 (L) 44.0 - 10.2 %   MCV 96.7 80.0 - 100.0 fL   MCH 30.9 26.0 - 34.0 pg   MCHC 32.0 30.0 - 36.0 g/dL   RDW 72.5 (H) 36.6 - 44.0 %   Platelets 121 (L) 150 - 400 K/uL   nRBC 0.0 0.0 - 0.2 %   Neutrophils Relative % 77 %   Neutro Abs 6.1 1.7 - 7.7 K/uL   Lymphocytes Relative 11 %   Lymphs Abs 0.9 0.7 - 4.0 K/uL   Monocytes Relative 9 %   Monocytes Absolute 0.7 0.1 - 1.0 K/uL   Eosinophils Relative 3 %   Eosinophils Absolute 0.2 0.0 - 0.5 K/uL   Basophils  Relative 0 %   Basophils Absolute 0.0 0.0 - 0.1 K/uL   Immature Granulocytes 0 %   Abs Immature Granulocytes 0.03 0.00 - 0.07 K/uL    Comment: Performed at [facility], [address], [place], [place] [address]  Troponin I (High Sensitivity)     Status: Abnormal   Collection Time: 04/02/23  5:55 AM  Result Value Ref Range   Troponin I (High Sensitivity) 101 (HH) <18 ng/L    Comment: CRITICAL RESULT CALLED TO, READ BACK BY AND VERIFIED WITH A.REYNOLDS RN [phone] 04/02/2023 BY G.GANADEN (NOTE) Elevated high sensitivity troponin I (hsTnI) values and significant  changes across serial measurements may suggest ACS but many other  chronic and acute conditions are known to elevate hsTnI results.  Refer to the "Links" section for chest pain algorithms and additional  guidance. Performed at [facility], [address], [place], [place] [address]  DG Chest 2 View  Result Date: 04/02/2023 CLINICAL DATA:  Shortness of breath. Missed hemodialysis. Chest pain. EXAM: CHEST - 2 VIEW COMPARISON:  Radiograph and CT 02/13/2023 FINDINGS: Stable cardiomegaly. Aortic atherosclerotic calcification. Pulmonary vascular congestion. Interstitial coarsening in the lower lungs suggestive both edema. No pleural effusion or pneumothorax. IMPRESSION: Cardiomegaly with mild interstitial edema. Electronically Signed   By: Minerva Fester M.D.   On: 04/02/2023 03:12    Pending Labs Unresulted Labs (From admission, onward)     Start     Ordered   04/02/23 1030  Calcium  Once,   R        04/02/23 1029   04/02/23 0156  Hepatitis B surface antibody,quantitative  (New Admission Hemo Labs (Hepatitis B))  Once,   URGENT        04/02/23 0157            Vitals/Pain Today's Vitals   04/02/23 1302 04/02/23 1340 04/02/23 1448 04/02/23 1453  BP:   (!) 169/125   Pulse:      Resp:   19   Temp:    98.6 F (37 C)  TempSrc:    Oral  SpO2:   100%   Weight: 96 kg 96 kg    PainSc: 0-No pain        Isolation Precautions No active isolations  Medications Medications  Chlorhexidine Gluconate Cloth 2 % PADS 6 each (has no administration in time range)  aspirin EC tablet 81 mg (81 mg Oral Given 04/02/23 1535)  atorvastatin (LIPITOR) tablet 80 mg (80 mg Oral Given 04/02/23 1536)  carvedilol (COREG) tablet 25 mg (25 mg Oral Given 04/02/23 1501)  irbesartan (AVAPRO) tablet 150 mg (150 mg Oral Given 04/02/23 1533)  calcium acetate (PHOSLO) capsule 1,334 mg (0 mg Oral Hold 04/02/23 1518)  famotidine (PEPCID) tablet 20 mg (has no administration in time range)  ferric citrate (AURYXIA) tablet 420 mg (has no administration in time range)  pantoprazole (PROTONIX) EC tablet 40 mg (40 mg Oral Given 04/02/23 1536)  gabapentin (NEURONTIN) capsule 100 mg (100 mg Oral Given 04/02/23 1534)  heparin injection 5,000 Units (5,000 Units Subcutaneous Patient Refused/Not Given 04/02/23 1546)  acetaminophen (TYLENOL) tablet 650 mg (650 mg Oral Given 04/02/23 0526)    Or  acetaminophen (TYLENOL) suppository 650 mg ( Rectal See Alternative 04/02/23 0526)  oxyCODONE (Oxy IR/ROXICODONE) immediate release tablet 5 mg (has no administration in time range)  morphine (PF) 2 MG/ML injection 2 mg (2 mg Intravenous Given 04/02/23 1207)  ondansetron (ZOFRAN) tablet 4 mg (has no administration in time range)    Or  ondansetron (ZOFRAN) injection 4 mg (has no administration in time range)  albuterol (PROVENTIL) (2.5 MG/3ML) 0.083% nebulizer solution 2.5 mg (has no administration in time range)  nitroGLYCERIN (NITROSTAT) SL tablet 0.4 mg (0.4 mg Sublingual Given 04/02/23 0546)  sodium zirconium cyclosilicate (LOKELMA) packet 10 g (10 g Oral Given 04/02/23 0208)  calcium gluconate inj 10% (1 g) URGENT USE ONLY! (1 g Intravenous Given 04/02/23 0207)  furosemide (LASIX) injection 60 mg (60 mg Intravenous Given 04/02/23 0545)    Mobility walks     Focused Assessments Cardiac Assessment Handoff:  Cardiac Rhythm: Normal sinus  rhythm Lab Results  Component Value Date   TROPONINI 0.18 (HH) 06/17/2018   Lab Results  Component Value Date   DDIMER 2.88 (H) 12/21/2022   Does the Patient currently have chest pain? No   , Renal Assessment Handoff:  Hemodialysis Schedule: Hemodialysis Schedule: Monday/Wednesday/Friday Last Hemodialysis date  and time: 04/02/23   Restricted appendage: right arm   R Recommendations: See Admitting Provider Note  Report given to:   Additional Notes:

## 2023-04-02 NOTE — Progress Notes (Signed)
Heart Failure Navigator Progress Note  Assessed for Heart & Vascular TOC clinic readiness.  Patient does not meet criteria due to ESRD on hemodialysis.   Navigator will sign off at this time.    Dawn Fields, BSN, RN Heart Failure Nurse Navigator Secure Chat Only   

## 2023-04-02 NOTE — ED Notes (Signed)
Pt placed on cardiac monitor and entered into central monitor for monitoring.

## 2023-04-02 NOTE — Consult Note (Signed)
Renal Service Consult Note [facility]  Corey Park 04/02/2023 Corey Krabbe, MD Requesting Physician: Dr. Jerral Ralph  Reason for Consult: ESRD pt w/ SOB/ pulm edema HPI: The patient is a 52 y.o. year-old w/ PMH as below who presented to ED early this am c/o SOB and CP. Missed HD Friday, last HD Wed (came off 5kg over). Pt was admitted. We are asked to see for dialysis.   Pt seen in HD unit. No new c/o's at this time. Wants to try for 4 L UF. No CP at this time.  ROS - denies CP, no joint pain, no HA, no blurry vision, no rash, no diarrhea, no nausea/ vomiting   Past Medical History  Past Medical History:  Diagnosis Date   Allergy, unspecified, initial encounter 08/25/2019   Asthma    as a child   Cellulitis, perineum 11/26/2019   CHF (congestive heart failure) (HCC) 2021   Complication of vascular dialysis catheter 08/25/2019   COVID-19 virus infection 07/27/2019   Diabetes mellitus without complication (HCC)    type 2   Dilated cardiomyopathy (HCC) 07/03/2018   ESRD on hemodialysis (HCC) 06/17/2018   MWF at [facility]   Fe deficiency anemia 12/23/2018   Hypertension    Legally blind    B/L   Pneumonia 2021   Type 2 diabetes mellitus with diabetic peripheral angiopathy without gangrene (HCC) 08/25/2019   Unspecified protein-calorie malnutrition (HCC) 08/25/2019   Past Surgical History  Past Surgical History:  Procedure Laterality Date   BASCILIC VEIN TRANSPOSITION Right 10/16/2019   Procedure: Basilic Vein Transposition Right Arm;  Surgeon: Nada Libman, MD;  Location: [facility] OR;  Service: Vascular;  Laterality: Right;   BASCILIC VEIN TRANSPOSITION Right 01/29/2020   Procedure: RIGHT ARM SECOND STAGE BASCILIC VEIN TRANSPOSITION;  Surgeon: Nada Libman, MD;  Location: [facility] OR;  Service: Vascular;  Laterality: Right;   BIOPSY  12/22/2018   Procedure: BIOPSY;  Surgeon: Carman Ching, MD;  Location: [facility] ENDOSCOPY;  Service: Endoscopy;;   CORONARY ULTRASOUND/IVUS  N/A 12/13/2022   Procedure: Coronary Ultrasound/IVUS;  Surgeon: Corky Crafts, MD;  Location: [facility] INVASIVE CV LAB;  Service: Cardiovascular;  Laterality: N/A;   ESOPHAGOGASTRODUODENOSCOPY N/A 12/22/2018   Procedure: ESOPHAGOGASTRODUODENOSCOPY (EGD);  Surgeon: Carman Ching, MD;  Location: [facility] ENDOSCOPY;  Service: Endoscopy;  Laterality: N/A;   IR FLUORO GUIDE CV LINE RIGHT  07/29/2019   IR US GUIDE VASC ACCESS RIGHT  07/29/2019   LEFT HEART CATH AND CORONARY ANGIOGRAPHY N/A 12/13/2022   Procedure: LEFT HEART CATH AND CORONARY ANGIOGRAPHY;  Surgeon: Corky Crafts, MD;  Location: [facility] INVASIVE CV LAB;  Service: Cardiovascular;  Laterality: N/A;   Family History  Family History  Problem Relation Age of Onset   CAD Mother    Hypertension Mother    Diabetes Neg Hx    Stroke Neg Hx    Cancer Neg Hx    Kidney failure Neg Hx    Stomach cancer Neg Hx    Colon cancer Neg Hx    Rectal cancer Neg Hx    Social History  reports that he has never smoked. He has never used smokeless tobacco. He reports that he does not currently use alcohol. He reports that he does not currently use drugs after having used the following drugs: Marijuana. Allergies No Known Allergies Home medications Prior to Admission medications   Medication Sig Start Date End Date Taking? Authorizing Provider  acetaminophen (TYLENOL) 325 MG tablet Take 2 tablets (650 mg total) by  mouth every 6 (six) hours as needed for up to 30 doses for moderate pain or mild pain. 12/22/22   Terald Sleeper, MD  aspirin EC 81 MG tablet Take 1 tablet (81 mg total) by mouth daily. Swallow whole. Patient taking differently: Take 81 mg by mouth in the morning. Swallow whole. 12/14/22   Modena Slater, DO  atorvastatin (LIPITOR) 80 MG tablet Take 1 tablet (80 mg total) by mouth daily. 12/15/22   Modena Slater, DO  calcium acetate (PHOSLO) 667 MG capsule Take 2 capsules (1,334 mg total) by mouth with breakfast, with lunch, and with evening meal. Patient  taking differently: Take 2,001 mg by mouth with breakfast, with lunch, and with evening meal. 12/14/22   Modena Slater, DO  carvedilol (COREG) 25 MG tablet Take 1 tablet (25 mg total) by mouth 2 (two) times daily with a meal. 12/14/22   Modena Slater, DO  famotidine (PEPCID) 20 MG tablet Take 1 tablet (20 mg total) by mouth 2 (two) times daily as needed for heartburn or indigestion. 02/09/23   Edwin Dada P, DO  ferric citrate (AURYXIA) 1 GM 210 MG(Fe) tablet Take 2 tablets (420 mg total) by mouth daily. 12/14/22   Modena Slater, DO  gabapentin (NEURONTIN) 100 MG capsule Take 1 capsule (100 mg total) by mouth 3 (three) times daily. Patient taking differently: Take 100 mg by mouth 2 (two) times daily. 12/14/22   Modena Slater, DO  Multiple Vitamins-Minerals (CERTAVITE/ANTIOXIDANTS) TABS Take 1 tablet by mouth at bedtime. 10/20/22   Tiffany Kocher, DO  multivitamin (RENA-VIT) TABS tablet Take 1 tablet by mouth daily. 11/22/22   [provider]  nitroGLYCERIN (NITROSTAT) 0.4 MG SL tablet Place 0.4 mg under the tongue every 5 (five) minutes as needed for chest pain. 12/19/22   [provider]  olmesartan (BENICAR) 20 MG tablet Take 1 tablet (20 mg total) by mouth daily. 12/14/22   Modena Slater, DO  omeprazole (PRILOSEC) 20 MG capsule Take 1 capsule (20 mg total) by mouth daily. 02/09/23   Edwin Dada P, DO  ondansetron (ZOFRAN-ODT) 4 MG disintegrating tablet Take 1 tablet (4 mg total) by mouth every 8 (eight) hours as needed for up to 12 doses for nausea or vomiting. 12/22/22   Terald Sleeper, MD  pantoprazole (PROTONIX) 40 MG tablet Take 1 tablet (40 mg total) by mouth daily. 12/14/22 02/13/23  Modena Slater, DO     Vitals:   04/02/23 0557 04/02/23 0705 04/02/23 0939 04/02/23 0945  BP:  (!) 187/96 (!) 180/96 (!) 176/79  Pulse:  88 88 90  Resp:  16 (!) 21 (!) 25  Temp: 98.3 F (36.8 C)  98.1 F (36.7 C)   TempSrc:   Oral   SpO2:  98% 98%    Exam Gen alert, no distress, on RA No rash,  cyanosis or gangrene Sclera anicteric, throat clear  No jvd or bruits Chest clear bilat to bases, no rales/ wheezing RRR no MRG Abd moderate abd distension, no mass or ascites +bs GU nl male MS no joint effusions or deformity Ext no LE or UE edema, no wounds or ulcers Neuro is alert, Ox 3 , nf    RUA AVF+bruit       Renal-related home meds: - phoslo 2 ac - coreg 25 bid - auryxia 2 every day - gabapentin 100 bid - renavite - olmesartan 150 every day (?)     OP HD: [facility] MWF  4h  450/1.5   100kg   2/2 bath  AVF RUA   Heparin none - last OP HD 10/02 (Wed), post wt 105.5kg - mircera 100 q 2, due 10/09 - rocaltrol 1.5 mcg   CXR - bilat pulm edema  Assessment/ Plan: Vol overload/ SOB/ pulm edema - came off 5kg over last Wed, missed Friday HD. Vol overload highly likely cause of SOB. Hd today.  ESRD - on HD MWF. HD today as above HTN/ BP - bp's are high c/w vol excess. HD as above.  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  MBD ckd - Ca looks high. Will repeat on HD. Hold po vdra and phoslo for now until repeat comes back. Phos is in range.  DM2      Vinson Moselle  MD [facility] 04/02/2023, 9:54 AM  Recent Labs  Lab 04/02/23 0024 04/02/23 0032 04/02/23 0555  HGB 10.6* 10.5* 9.5*  ALBUMIN  --   --  3.0*  CALCIUM 8.3*  --  11.4*  PHOS  --   --  5.3*  CREATININE 17.86* >18.00* 15.58*  K 6.1* 6.1* 5.2*   Inpatient medications:  aspirin EC  81 mg Oral q AM   atorvastatin  80 mg Oral Daily   calcium acetate  1,334 mg Oral TID with meals   carvedilol  25 mg Oral BID WC   Chlorhexidine Gluconate Cloth  6 each Topical Q0600   ferric citrate  420 mg Oral Daily   gabapentin  100 mg Oral BID   heparin  5,000 Units Subcutaneous Q8H   irbesartan  150 mg Oral Daily   pantoprazole  40 mg Oral Daily    acetaminophen **OR** acetaminophen, albuterol, famotidine, morphine injection, nitroGLYCERIN, ondansetron **OR** ondansetron (ZOFRAN) IV, oxyCODONE,  pentafluoroprop-tetrafluoroeth

## 2023-04-02 NOTE — ED Notes (Signed)
Gave pt 4 oz of cranberry juice.

## 2023-04-02 NOTE — Progress Notes (Signed)
   04/02/23 1252  Vitals  Temp 98.3 F (36.8 C)  Temp Source Oral  BP (!) 192/90  BP Location Left Arm  BP Method Automatic  Patient Position (if appropriate) Lying  Resp 17  During Treatment Monitoring  Blood Flow Rate (mL/min) 400 mL/min  Arterial Pressure (mmHg) -209.48 mmHg  Venous Pressure (mmHg) 324.23 mmHg  TMP (mmHg) 27.07 mmHg  Ultrafiltration Rate (mL/min) 1607 mL/min  Dialysate Flow Rate (mL/min) 299 ml/min  Dialysate Potassium Concentration 2  Dialysate Calcium Concentration 2.5  Duration of HD Treatment -hour(s) 3 hour(s)  Cumulative Fluid Removed (mL) per Treatment  3997.27  HD Safety Checks Performed Yes  Intra-Hemodialysis Comments Tx completed  Post Treatment  Dialyzer Clearance Lightly streaked  Hemodialysis Intake (mL) 0 mL  Liters Processed 72  Fluid Removed (mL) 4000 mL  Tolerated HD Treatment Yes  Post-Hemodialysis Comments Pt goal met.  AVG/AVF Arterial Site Held (minutes) 10 minutes  AVG/AVF Venous Site Held (minutes) 10 minutes  Fistula / Graft Right Upper arm Arteriovenous fistula  Placement Date/Time: 10/16/19 0833   Placed prior to admission: No  Orientation: Right  Access Location: Upper arm  Access Type: (c) Arteriovenous fistula  Site Condition No complications  Fistula / Graft Assessment Present;Thrill;Bruit  Status Deaccessed  Needle Size 15  Drainage Description None

## 2023-04-02 NOTE — Assessment & Plan Note (Signed)
-   Secondary to volume overload - Cardiorenal syndrome - Requiring 2 L nasal cannula - No oxygen requirement at baseline - Chest x-ray shows cardiomegaly with mild interstitial edema - Defer fluid management to nephrology - Wean off O2 as tolerated - Continue to monitor

## 2023-04-03 ENCOUNTER — Inpatient Hospital Stay (HOSPITAL_BASED_OUTPATIENT_CLINIC_OR_DEPARTMENT_OTHER): Payer: Medicare Other

## 2023-04-03 DIAGNOSIS — I5030 Unspecified diastolic (congestive) heart failure: Secondary | ICD-10-CM | POA: Diagnosis not present

## 2023-04-03 DIAGNOSIS — J9601 Acute respiratory failure with hypoxia: Secondary | ICD-10-CM | POA: Diagnosis not present

## 2023-04-03 LAB — ECHOCARDIOGRAM COMPLETE
AR max vel: 3 cm2
AV Peak grad: 8.4 mm[Hg]
Ao pk vel: 1.45 m/s
Area-P 1/2: 4.21 cm2
MV M vel: 2.18 m/s
MV Peak grad: 19 mm[Hg]
S' Lateral: 3.8 cm
Weight: 3396.85 [oz_av]

## 2023-04-03 LAB — HEPATITIS B SURFACE ANTIBODY, QUANTITATIVE: Hep B S AB Quant (Post): 128 m[IU]/mL

## 2023-04-03 MED ORDER — GABAPENTIN 100 MG PO CAPS: 100.0000 mg | ORAL_CAPSULE | Freq: Two times a day (BID) | ORAL | Status: DC

## 2023-04-03 NOTE — Discharge Planning (Signed)
[facility] Patient Discharge Orders- [facility] CLINIC: [facility]  Patient's name: Corey Park Admit/DC Dates: 04/02/2023 - 04/03/2023  Discharge Diagnoses: Acute respiratory failure w/hypoxia  Volume overload/pulmonary edema 2/2 missed HD Hyperkalemia  Aranesp: Given: no   Last Hgb: 9.5 PRBC's Given: no  ESA dose for discharge: no change IV Iron dose at discharge: no change  Heparin change: no  EDW Change: yes New EDW: 100kg - unsure of accuracy of weights, call PA on call if comes in way over or under  Bath Change: no  Access intervention/Change: no Details:  Hectorol/Calcitriol change: no  Discharge Labs: Calcium 8.3 Phosphorus 5.3 Albumin 3.0 K+ 5.2  Start ONSP if not already getting  IV Antibiotics: no Details:  On Coumadin?: no Last INR: Next INR: Managed By:   OTHER/APPTS/LAB ORDERS:    D/C Meds to be reconciled by nurse after every discharge.  Completed By: Virgina Norfolk, PA-C   Reviewed by: MD:______ RN_______

## 2023-04-03 NOTE — Plan of Care (Signed)
  Problem: Education: Goal: Understanding of CV disease, CV risk reduction, and recovery process will improve Outcome: Adequate for Discharge Goal: Individualized Educational Video(s) Outcome: Adequate for Discharge   Problem: Activity: Goal: Ability to return to baseline activity level will improve Outcome: Adequate for Discharge   Problem: Cardiovascular: Goal: Ability to achieve and maintain adequate cardiovascular perfusion will improve Outcome: Adequate for Discharge Goal: Vascular access site(s) Level 0-1 will be maintained Outcome: Adequate for Discharge   Problem: Health Behavior/Discharge Planning: Goal: Ability to safely manage health-related needs after discharge will improve Outcome: Adequate for Discharge   Problem: Education: Goal: Ability to demonstrate management of disease process will improve Outcome: Adequate for Discharge Goal: Ability to verbalize understanding of medication therapies will improve Outcome: Adequate for Discharge Goal: Individualized Educational Video(s) Outcome: Adequate for Discharge   Problem: Activity: Goal: Capacity to carry out activities will improve Outcome: Adequate for Discharge   Problem: Cardiac: Goal: Ability to achieve and maintain adequate cardiopulmonary perfusion will improve Outcome: Adequate for Discharge

## 2023-04-03 NOTE — Care Management CC44 (Signed)
Condition Code 44 Documentation Completed  Patient Details  Name: Kirkland Figg MRN: [id number] Date of Birth: Nov 12, 1970   Condition Code 44 given:  Yes Patient signature on Condition Code 44 notice:  Yes Documentation of 2 MD's agreement:  Yes Code 44 added to claim:  Yes    Tom-Johnson, Hershal Coria, RN 04/03/2023, 11:51 AM

## 2023-04-03 NOTE — Discharge Summary (Signed)
Physician Discharge Summary  Corey Park ZOX:[id number] DOB: [birth year] DOA: 04/02/2023  PCP: [name], NP  Admit date: 04/02/2023 Discharge date: 04/03/2023  Admitted From: Home Disposition: Home  Recommendations for Outpatient Follow-up:  Go to your outpatient dialysis center tomorrow  Home Health: N/A Equipment/Devices: N/A  Discharge Condition: Stable CODE STATUS: Full code Diet recommendation: Low-salt diet, fluid restriction 1200 mL/day.  Discharge summary: 52 year old gentleman with history of ESRD on hemodialysis, Monday Wednesday Friday.  His transportation did not show up Friday morning so he missed hemodialysis.  Last dialysis was Wednesday last week.  Since Sunday afternoon , he has been having mild chest discomfort, shortness of breath on mobility so came to the hospital.  Still makes urine. Patient was admitted for observation, underwent hemodialysis with fluid removal.  Symptomatically improved.  Recommended not to miss any hemodialysis sessions. He has a scheduled hemodialysis tomorrow. Mild troponin elevation, unlikely acute coronary syndrome.  Recent echocardiogram with ejection fraction 65%.  Patient cardiac cath with nonobstructive disease throughout the left main.  Recommended medical therapy.  Discharged home.  Resume all outpatient care.  Discharge Diagnoses:  Principal Problem:   Acute respiratory failure with hypoxia (HCC) Active Problems:   Elevated troponin   (HFpEF) heart failure with preserved ejection fraction (HCC)   DM (diabetes mellitus), type 2 (HCC)   HTN (hypertension)   GERD (gastroesophageal reflux disease)   HLD (hyperlipidemia)   ESRD on dialysis [facility])   Fluid overload    Discharge Instructions  Discharge Instructions     Call MD for:  difficulty breathing, headache or visual disturbances   Complete by: As directed    Diet - low sodium heart healthy   Complete by: As directed    Fluid restrictions 1200 ml/ day    Discharge instructions   Complete by: As directed    Go for your usual dialysis session tomorrow   Increase activity slowly   Complete by: As directed       Allergies as of 04/03/2023   No Known Allergies      Medication List     TAKE these medications    atorvastatin 80 MG tablet Commonly known as: LIPITOR Take 1 tablet (80 mg total) by mouth daily.   calcium acetate 667 MG capsule Commonly known as: PHOSLO Take 2 capsules (1,334 mg total) by mouth with breakfast, with lunch, and with evening meal. What changed: how much to take   carvedilol 25 MG tablet Commonly known as: COREG Take 1 tablet (25 mg total) by mouth 2 (two) times daily with a meal.   CertaVite/Antioxidants Tabs Take 1 tablet by mouth at bedtime.   ferric citrate 1 GM 210 MG(Fe) tablet Commonly known as: Auryxia Take 2 tablets (420 mg total) by mouth daily.   gabapentin 100 MG capsule Commonly known as: Neurontin Take 1 capsule (100 mg total) by mouth 2 (two) times daily.   MIRCERA IJ Inject 1 Syringe into the skin every 14 (fourteen) days.   multivitamin Tabs tablet Take 1 tablet by mouth daily.   nitroGLYCERIN 0.4 MG SL tablet Commonly known as: NITROSTAT Place 0.4 mg under the tongue every 5 (five) minutes as needed for chest pain.   olmesartan 20 MG tablet Commonly known as: BENICAR Take 1 tablet (20 mg total) by mouth daily.        No Known Allergies  Consultations: Nephrology   Procedures/Studies: DG Chest 2 View  Result Date: 04/02/2023 CLINICAL DATA:  Shortness of breath. Missed hemodialysis. Chest pain.  EXAM: CHEST - 2 VIEW COMPARISON:  Radiograph and CT 02/13/2023 FINDINGS: Stable cardiomegaly. Aortic atherosclerotic calcification. Pulmonary vascular congestion. Interstitial coarsening in the lower lungs suggestive both edema. No pleural effusion or pneumothorax. IMPRESSION: Cardiomegaly with mild interstitial edema. Electronically Signed   By: Minerva Fester M.D.   On:  04/02/2023 03:12   (Echo, Carotid, EGD, Colonoscopy, ERCP)    Subjective: Patient seen in the morning rounds.  Denies any complaints.  Denies any chest pain or shortness of breath.   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 Cardiovascular: RRR, S1/S2 +, no rubs, no gallops Respiratory: CTA bilaterally, no wheezing, no rhonchi Abdominal: Soft, NT, ND, bowel sounds + Extremities: Left upper extremity AV fistula present.    The results of significant diagnostics from this hospitalization (including imaging, microbiology, ancillary and laboratory) are listed below for reference.     Microbiology: Recent Results (from the past 240 hour(s))  Resp panel by RT-PCR (RSV, Flu A&B, Covid) Anterior Nasal Swab     Status: None   Collection Time: 04/02/23  1:16 AM   Specimen: Anterior Nasal Swab  Result Value Ref Range Status   SARS Coronavirus 2 by RT PCR NEGATIVE NEGATIVE Final   Influenza A by PCR NEGATIVE NEGATIVE Final   Influenza B by PCR NEGATIVE NEGATIVE Final    Comment: (NOTE) The Xpert Xpress SARS-CoV-2/FLU/RSV plus assay is intended as an aid in the diagnosis of influenza from Nasopharyngeal swab specimens and should not be used as a sole basis for treatment. Nasal washings and aspirates are unacceptable for Xpert Xpress SARS-CoV-2/FLU/RSV testing.  Fact Sheet for Patients: BloggerCourse.com  Fact Sheet for Healthcare Providers: SeriousBroker.it  This  test is not yet approved or cleared by the [place] FDA and has been authorized for detection and/or diagnosis of SARS-CoV-2 by FDA under an Emergency Use Authorization (EUA). This EUA will remain in effect (meaning this test can be used) for the duration of the COVID-19 declaration under Section 564(b)(1) of the Act, 21 U.S.C. section 360bbb-3(b)(1), unless the authorization is terminated or revoked.     Resp Syncytial Virus by PCR NEGATIVE NEGATIVE Final    Comment: (NOTE) Fact Sheet for Patients: BloggerCourse.com  Fact Sheet for Healthcare Providers: SeriousBroker.it  This test is not yet approved or cleared by the [place] FDA and has been authorized for detection and/or diagnosis of SARS-CoV-2 by FDA under an Emergency Use Authorization (EUA). This EUA will remain in effect (meaning this test can be used) for the duration of the COVID-19 declaration under Section 564(b)(1) of the Act, 21 U.S.C. section 360bbb-3(b)(1), unless the authorization is terminated or revoked.  Performed at [facility], [address], [place], [place] [address]      Labs: BNP (last 3 results) Recent Labs    10/18/22 0845 01/03/23 0941 02/09/23 0856  BNP 163.7*  299.2* 282.1*   Basic Metabolic Panel: Recent Labs  Lab 04/02/23 0024 04/02/23 0032 04/02/23 0555 04/02/23 2052  NA 138 139 139  --   K 6.1* 6.1* 5.2*  --   CL 101 107 103  --   CO2 25  --  19*  --   GLUCOSE 122* 120* 86  --   BUN 77* 75* 71*  --   CREATININE 17.86* >18.00* 15.58*  --   CALCIUM 8.3*  --  11.4* 8.3*  MG  --   --  1.4*  --   PHOS  --   --  5.3*  --    Liver Function Tests: Recent Labs  Lab 04/02/23 0555  AST 23  ALT 13  ALKPHOS 49  BILITOT 0.6  PROT 6.9  ALBUMIN 3.0*   No results for input(s): "LIPASE", "AMYLASE" in the last 168 hours. No results for input(s): "AMMONIA" in the last 168 hours. CBC: Recent Labs  Lab 04/02/23 0024  04/02/23 0032 04/02/23 0555  WBC 8.1  --  8.0  NEUTROABS 6.0  --  6.1  HGB 10.6* 10.5* 9.5*  HCT 32.8* 31.0* 29.7*  MCV 94.8  --  96.7  PLT 148*  --  121*   Cardiac Enzymes: No results for input(s): "CKTOTAL", "CKMB", "CKMBINDEX", "TROPONINI" in the last 168 hours. BNP: Invalid input(s): "POCBNP" CBG: No results for input(s): "GLUCAP" in the last 168 hours. D-Dimer No results for input(s): "DDIMER" in the last 72 hours. Hgb A1c No results for input(s): "HGBA1C" in the last 72 hours. Lipid Profile No results for input(s): "CHOL", "HDL", "LDLCALC", "TRIG", "CHOLHDL", "LDLDIRECT" in the last 72 hours. Thyroid function studies No results for input(s): "TSH", "T4TOTAL", "T3FREE", "THYROIDAB" in the last 72 hours.  Invalid input(s): "FREET3" Anemia work up No results for input(s): "VITAMINB12", "FOLATE", "FERRITIN", "TIBC", "IRON", "RETICCTPCT" in the last 72 hours. Urinalysis    Component Value Date/Time   COLORURINE YELLOW 04/17/2022 1916   APPEARANCEUR CLEAR 04/17/2022 1916   LABSPEC 1.015 04/17/2022 1916   PHURINE 7.0 04/17/2022 1916   GLUCOSEU 50 (A) 04/17/2022 1916   HGBUR SMALL (A) 04/17/2022 1916   BILIRUBINUR NEGATIVE 04/17/2022 1916   KETONESUR NEGATIVE 04/17/2022 1916   PROTEINUR 100 (A) 04/17/2022 1916   NITRITE NEGATIVE 04/17/2022 1916   LEUKOCYTESUR TRACE (A) 04/17/2022 1916   Sepsis Labs Recent Labs  Lab 04/02/23 0024 04/02/23 0555  WBC 8.1 8.0   Microbiology Recent Results (from the past 240 hour(s))  Resp panel by RT-PCR (RSV, Flu A&B, Covid) Anterior Nasal Swab     Status: None   Collection Time: 04/02/23  1:16 AM   Specimen: Anterior Nasal Swab  Result Value Ref Range Status   SARS Coronavirus 2 by RT PCR NEGATIVE NEGATIVE Final   Influenza A by PCR NEGATIVE NEGATIVE Final   Influenza B by PCR NEGATIVE NEGATIVE Final    Comment: (NOTE) The Xpert Xpress SARS-CoV-2/FLU/RSV plus assay is intended as an aid in the diagnosis of influenza from  Nasopharyngeal swab specimens and should not be used as a sole basis for treatment. Nasal washings and aspirates are unacceptable for Xpert Xpress SARS-CoV-2/FLU/RSV testing.  Fact Sheet for Patients: BloggerCourse.com  Fact Sheet for Healthcare Providers: SeriousBroker.it  This test is not yet approved or cleared by the [place] FDA and has been authorized for detection and/or diagnosis of SARS-CoV-2 by FDA under an Emergency Use Authorization (EUA). This EUA will remain in effect (meaning this test can be used) for the duration of  the COVID-19 declaration under Section 564(b)(1) of the Act, 21 U.S.C. section 360bbb-3(b)(1), unless the authorization is terminated or revoked.     Resp Syncytial Virus by PCR NEGATIVE NEGATIVE Final    Comment: (NOTE) Fact Sheet for Patients: BloggerCourse.com  Fact Sheet for Healthcare Providers: SeriousBroker.it  This test is not yet approved or cleared by the [place] FDA and has been authorized for detection and/or diagnosis of SARS-CoV-2 by FDA under an Emergency Use Authorization (EUA). This EUA will remain in effect (meaning this test can be used) for the duration of the COVID-19 declaration under Section 564(b)(1) of the Act, 21 U.S.C. section 360bbb-3(b)(1), unless the authorization is terminated or revoked.  Performed at [facility], [address] [place], [place] [address]      Time coordinating discharge: 28 minutes  SIGNED:   Dorcas Carrow, MD  [facility] 04/03/2023, 10:53 AM

## 2023-04-03 NOTE — Progress Notes (Signed)
[facility] Progress Note   Subjective:   Patient seen and examined at bedside.  Reports breathing better today.  Admits to intermittent CP but improved this AM.  Denies SOB, abdominal pain, CP, and n/v/d. Ready to go home.    Objective Vitals:   04/02/23 2206 04/03/23 0500 04/03/23 0530 04/03/23 0916  BP: (!) 143/70  138/71 126/69  Pulse: 89  79 73  Resp: 19  18   Temp: 98.7 F (37.1 C)  98.4 F (36.9 C) 97.9 F (36.6 C)  TempSrc: Oral  Oral Axillary  SpO2: 97%  98% 99%  Weight:  96.3 kg     Physical Exam General:alert, blind male in NAD Heart:RRR, no mrg Lungs:CTAB, nml WOB on RA Abdomen:soft, NTND Extremities:no LE edema Dialysis Access: RU AVF +b/t   Filed Weights   04/02/23 1302 04/02/23 1340 04/03/23 0500  Weight: 96 kg 96 kg 96.3 kg    Intake/Output Summary (Last 24 hours) at 04/03/2023 1031 Last data filed at 04/03/2023 0655 Gross per 24 hour  Intake --  Output 4000 ml  Net -4000 ml    Additional Objective Labs: Basic Metabolic Panel: Recent Labs  Lab 04/02/23 0024 04/02/23 0032 04/02/23 0555 04/02/23 2052  NA 138 139 139  --   K 6.1* 6.1* 5.2*  --   CL 101 107 103  --   CO2 25  --  19*  --   GLUCOSE 122* 120* 86  --   BUN 77* 75* 71*  --   CREATININE 17.86* >18.00* 15.58*  --   CALCIUM 8.3*  --  11.4* 8.3*  PHOS  --   --  5.3*  --    Liver Function Tests: Recent Labs  Lab 04/02/23 0555  AST 23  ALT 13  ALKPHOS 49  BILITOT 0.6  PROT 6.9  ALBUMIN 3.0*   CBC: Recent Labs  Lab 04/02/23 0024 04/02/23 0032 04/02/23 0555  WBC 8.1  --  8.0  NEUTROABS 6.0  --  6.1  HGB 10.6* 10.5* 9.5*  HCT 32.8* 31.0* 29.7*  MCV 94.8  --  96.7  PLT 148*  --  121*   Blood Culture    Component Value Date/Time   SDES BLOOD SITE NOT SPECIFIED 04/17/2022 0720   SPECREQUEST  04/17/2022 0720    BOTTLES DRAWN AEROBIC AND ANAEROBIC Blood Culture adequate volume   CULT  04/17/2022 0720    NO GROWTH 5 DAYS Performed at [facility]  Lab, [address], [place], [place] [address]    REPTSTATUS 04/22/2022 FINAL 04/17/2022 0720    Studies/Results: DG Chest 2 View  Result Date: 04/02/2023 CLINICAL DATA:  Shortness of breath. Missed hemodialysis. Chest pain. EXAM: CHEST - 2 VIEW COMPARISON:  Radiograph and CT 02/13/2023 FINDINGS: Stable cardiomegaly. Aortic atherosclerotic calcification. Pulmonary vascular congestion. Interstitial coarsening in the lower lungs suggestive both edema. No pleural effusion or pneumothorax. IMPRESSION: Cardiomegaly with mild interstitial edema. Electronically Signed   By: Minerva Fester M.D.   On: 04/02/2023 03:12    Medications:   aspirin EC  81 mg Oral q AM   atorvastatin  80 mg Oral Daily   calcium acetate  1,334 mg Oral TID with meals   carvedilol  25 mg Oral BID WC   Chlorhexidine Gluconate Cloth  6 each Topical Q0600   ferric citrate  420 mg Oral Daily   gabapentin  100 mg Oral BID   heparin  5,000 Units Subcutaneous Q8H   irbesartan  150 mg Oral  Daily   pantoprazole  40 mg Oral Daily    Dialysis Orders: [facility] MWF  4h  450/1.5   100kg   2/2 bath  AVF RUA   Heparin none - last OP HD 10/02 (Wed), post wt 105.5kg - mircera 100 q 2, due 10/09 - rocaltrol 1.5 mcg    Assessment/Plan: 1. Volume overload/SOB/pulmonary edema - 2/2 missed HD.  Symptoms improved. UF 4L yesterday.  Under dry if weights correct.  Lower on d/c.  2. ESRD - on HD MWF.  HD tomorrow per regular schedule. 3. Anemia of CKD- Hgb 9.5. ESA due tomorrow.  4. Secondary hyperparathyroidism - Ca improved. Phos at goal. Continue VDRA and binders.  5. HTN - Blood pressure improved to goal today. Continue home meds.  6. Nutrition - Renal diet with fluid restrictions.  7. Dispo - ok for d/c from renal standpoint.   Virgina Norfolk, PA-C [facility] 04/03/2023,10:31 AM  LOS: 1 day

## 2023-04-03 NOTE — Progress Notes (Signed)
Pt was d/c today. Contacted [facility] to advise clinic of pt's d/c today and that pt should resume care tomorrow.   Olivia Canter Renal Navigator [phone]

## 2023-04-03 NOTE — Progress Notes (Signed)
Echocardiogram 2D Echocardiogram has been performed.  [facility] 04/03/2023, 9:08 AM

## 2023-04-03 NOTE — TOC Transition Note (Signed)
Transition of Care [facility]) - CM/SW Discharge Note   Patient Details  Name: Corey Park MRN: [id number] Date of Birth: 1971/04/04  Transition of Care [facility]) CM/SW Contact:  Tom-Johnson, Hershal Coria, RN Phone Number: 04/03/2023, 12:07 PM   Clinical Narrative:     Patient is scheduled for discharge today.  Readmission Risk Assessment done. Hospital f/u and discharge instructions on AVS. Patient requested for a cab, Therapist, nutritional and Release of Liability form explained to patient with understanding verbalized, form signed and placed in patient's chart. Cab voucher given to RN. No further TOC needs noted.        Final next level of care: Home/Self Care Barriers to Discharge: Barriers Resolved   Patient Goals and CMS Choice CMS Medicare.gov Compare Post Acute Care list provided to:: Patient Choice offered to / list presented to : Patient  Discharge Placement                  Patient to be transferred to facility by: [facility]      Discharge Plan and Services Additional resources added to the After Visit Summary for                  DME Arranged: N/A DME Agency: NA       HH Arranged: NA HH Agency: NA        Social Determinants of Health (SDOH) Interventions SDOH Screenings   Food Insecurity: No Food Insecurity (04/02/2023)  Recent Concern: Food Insecurity - Food Insecurity Present (02/13/2023)  Housing: Medium Risk (04/02/2023)  Transportation Needs: Unmet Transportation Needs (04/02/2023)  Utilities: Not At Risk (04/02/2023)  Depression (PHQ2-9): Low Risk  (08/10/2022)  Tobacco Use: Low Risk  (04/02/2023)     Readmission Risk Interventions    12/13/2022   10:39 AM  Readmission Risk Prevention Plan  Transportation Screening Complete  PCP or Specialist Appt within 3-5 Days Complete  HRI or Home Care Consult Complete  Social Work Consult for Recovery Care Planning/Counseling Complete  Palliative Care Screening Complete  Medication Review [facility])  Referral to Pharmacy

## 2023-04-03 NOTE — Care Management Obs Status (Signed)
MEDICARE OBSERVATION STATUS NOTIFICATION   Patient Details  Name: Corey Park MRN: [id number] Date of Birth: [birth year]   Medicare Observation Status Notification Given:  Yes    Tom-Johnson, Hershal Coria, RN 04/03/2023, 11:51 AM

## 2023-04-04 ENCOUNTER — Telehealth: Payer: Self-pay

## 2023-04-04 NOTE — Transitions of Care (Post Inpatient/ED Visit) (Signed)
04/04/2023  Name: Kent Grabinski MRN: [id number] DOB: 1970-11-03  Today's TOC FU Call Status: Today's TOC FU Call Status:: Unsuccessful Call (1st Attempt) Unsuccessful Call (1st Attempt) Date: 04/04/23  Attempted to reach the patient regarding the most recent Inpatient/ED visit.  Follow Up Plan: Additional outreach attempts will be made to reach the patient to complete the Transitions of Care (Post Inpatient/ED visit) call.   Signature  Robyne Peers, RN

## 2023-04-05 ENCOUNTER — Telehealth: Payer: Self-pay

## 2023-04-05 NOTE — Transitions of Care (Post Inpatient/ED Visit) (Signed)
04/05/2023  Name: Corey Park MRN: [id number] DOB: Mar 20, 1971  Today's TOC FU Call Status: Today's TOC FU Call Status:: Successful TOC FU Call Completed Unsuccessful Call (1st Attempt) Date: 04/04/23 Unsuccessful Call (2nd Attempt) Date: 04/05/23 [facility] FU Call Complete Date: 04/05/23 Patient's Name and Date of Birth confirmed.  Transition Care Management Follow-up Telephone Call Date of Discharge: 04/03/23 Discharge Facility: [facility]) Type of Discharge: Inpatient Admission Primary Inpatient Discharge Diagnosis:: acute respiratory failure with hypoxia How have you been since you were released from the hospital?: Better Any questions or concerns?: No  Items Reviewed: Did you receive and understand the discharge instructions provided?: Yes Medications obtained,verified, and reconciled?: Partial Review Completed Reason for Partial Mediation Review: He said he has all of his medications and did not have any questions about the med regime and did not need to review the med list. Any new allergies since your discharge?: No Dietary orders reviewed?: Yes Type of Diet Ordered:: low sodium heart healthy - 1200 ml/day fluid restriction and he stated he adheres to it. Do you have support at home?: Yes People in Home: child(ren), adult Name of Support/Comfort Primary Source: his son  Medications Reviewed Today: Medications Reviewed Today   Medications were not reviewed in this encounter     Home Care and Equipment/Supplies: Were Home Health Services Ordered?: No Any new equipment or medical supplies ordered?: No  Functional Questionnaire: Do you need assistance with bathing/showering or dressing?: No (He is interested in [facility] and is in the process of re-applying for Medicaid.  I told him that [employer] would cover PCS if he qualifies for the need for assitance with personal care.) Do you need assistance with meal preparation?: Yes Do you need assistance with eating?: No Do you  have difficulty maintaining continence: No Do you need assistance with getting out of bed/getting out of a chair/moving?: No Do you have difficulty managing or taking your medications?: No  Follow up appointments reviewed: PCP Follow-up appointment confirmed?: Yes Date of PCP follow-up appointment?: 04/26/23 Follow-up Provider: Dr Andrey Campanile   - [facility] Follow-up appointment confirmed?: NA Do you need transportation to your follow-up appointment?: No Do you understand care options if your condition(s) worsen?: Yes-patient verbalized understanding   The appointment information was text to the patient as he requested   SIGNATURE.  Robyne Peers, RN

## 2023-04-05 NOTE — Transitions of Care (Post Inpatient/ED Visit) (Signed)
04/05/2023  Name: Corey Park MRN: [id number] DOB: 1971-02-25  Today's TOC FU Call Status: Today's TOC FU Call Status:: Unsuccessful Call (2nd Attempt) Unsuccessful Call (1st Attempt) Date: 04/04/23 Unsuccessful Call (2nd Attempt) Date: 04/05/23  Attempted to reach the patient regarding the most recent Inpatient/ED visit.  Follow Up Plan: Additional outreach attempts will be made to reach the patient to complete the Transitions of Care (Post Inpatient/ED visit) call.   Ricky Stabs, NP is listed as PCP but the patient has never seen her. His last appointment at [facility] was in 2021  Signature Robyne Peers, RN

## 2023-04-11 IMAGING — CT CT CHEST W/O CM
2 of 4 series · 15 of 36 positions shown, 18 images · non-contrast
Comparison: None.

CLINICAL DATA: Hiccups,Dysphagia

EXAM:
CT CHEST WITHOUT CONTRAST
TECHNIQUE: Multidetector CT imaging of the chest was performed following the
standard protocol without IV contrast.

[Series 2: thorax · axial · 0.65mm/px · z∈[+1389,+1675]mm · 12 of 169 slices shown, 15 images]
[im 13/169  mediastinal]
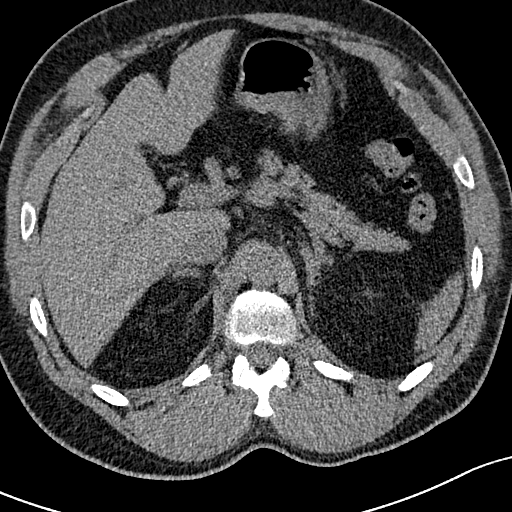
[im 13/169  lung]
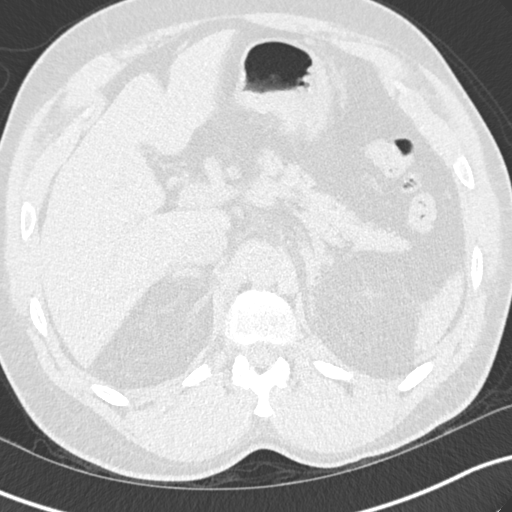
[im 26/169  lung]
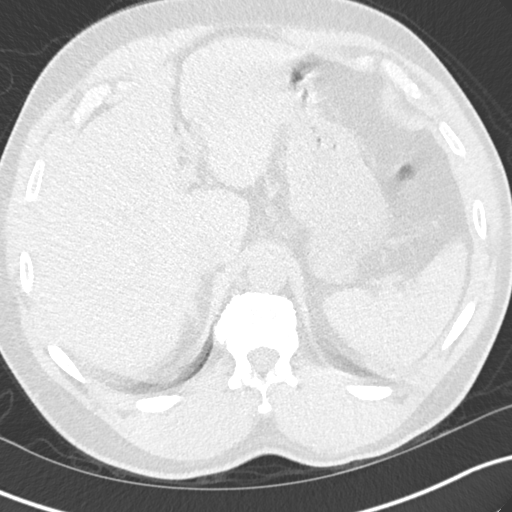
[im 39/169  lung]
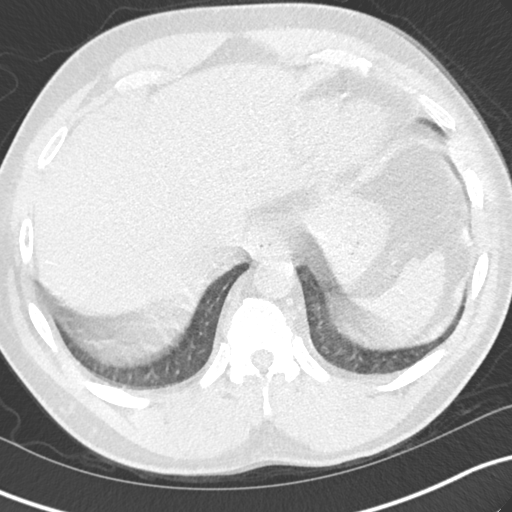
[im 52/169  lung]
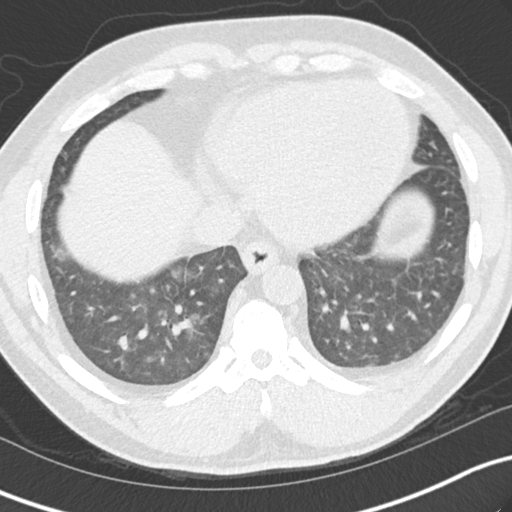
[im 65/169  mediastinal]
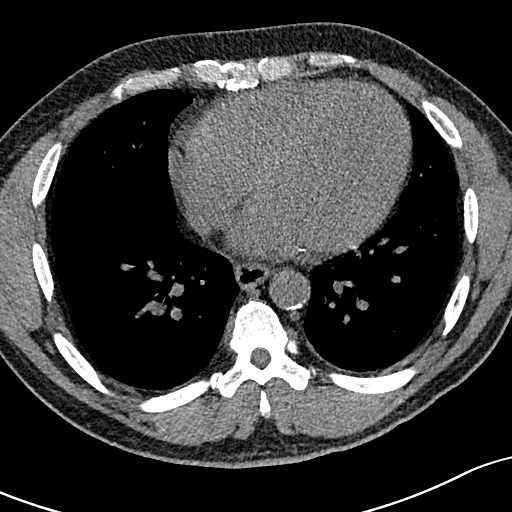
[im 65/169  lung]
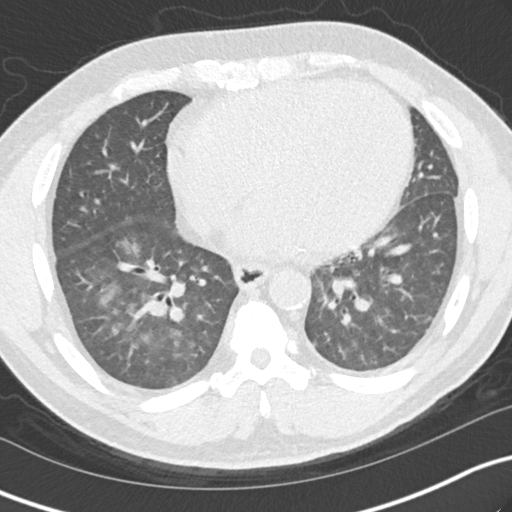
[im 78/169  lung]
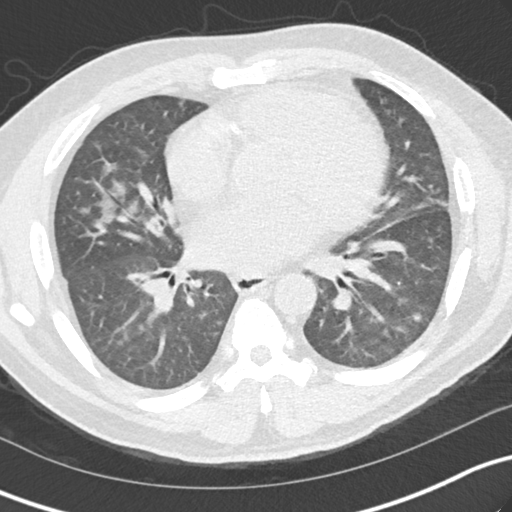
[im 91/169  lung]
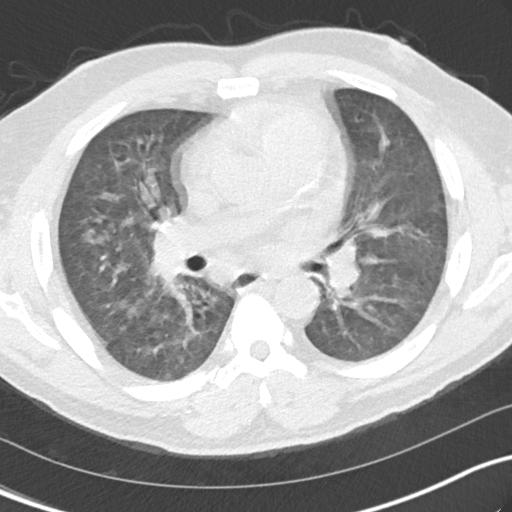
[im 104/169  lung]
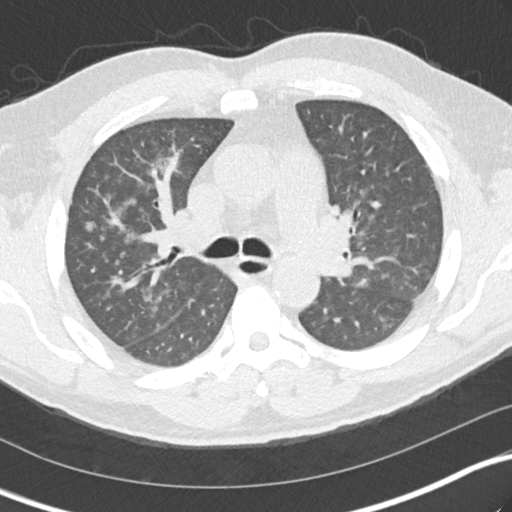
[im 117/169  mediastinal]
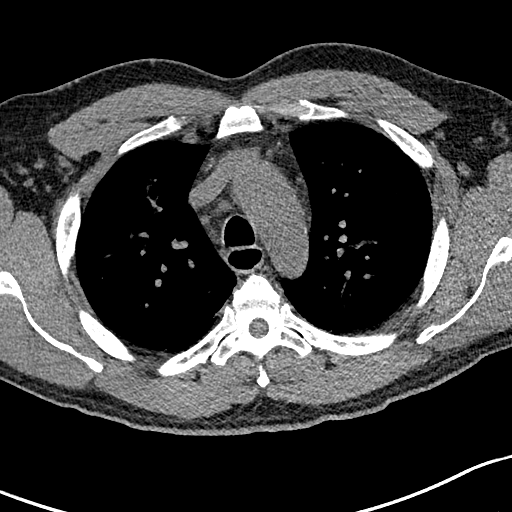
[im 117/169  lung]
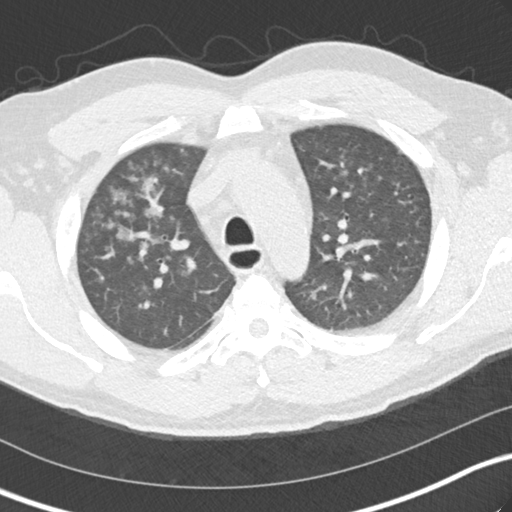
[im 130/169  lung]
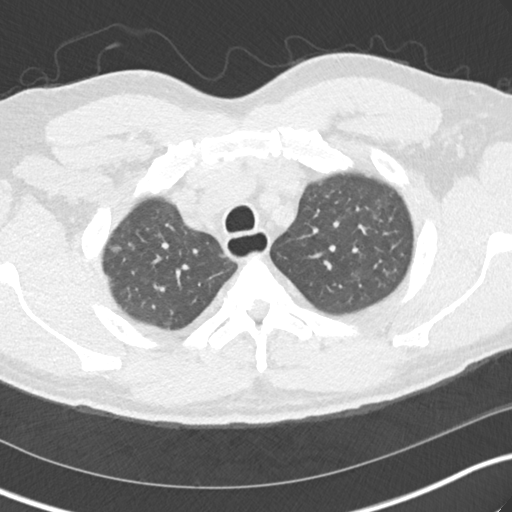
[im 143/169  lung]
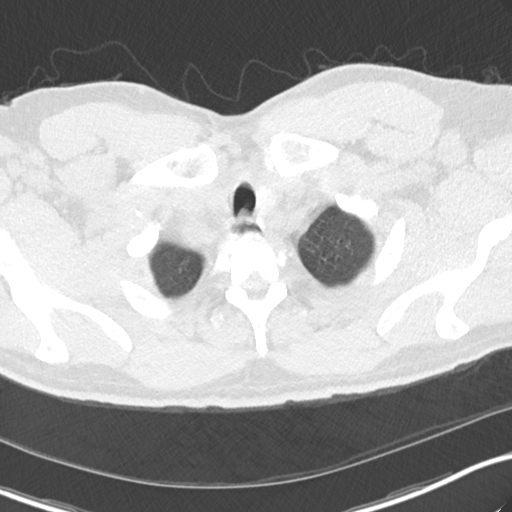
[im 156/169  lung]
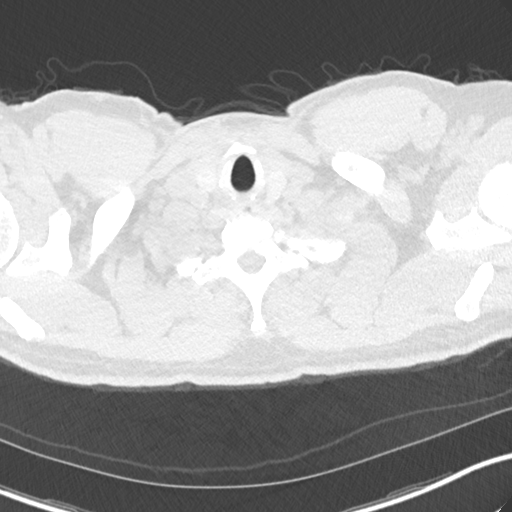

[Series 6: coronal · coronal · 0.68mm/px · 3 of 151 slices shown]
[im 31/151  lung]
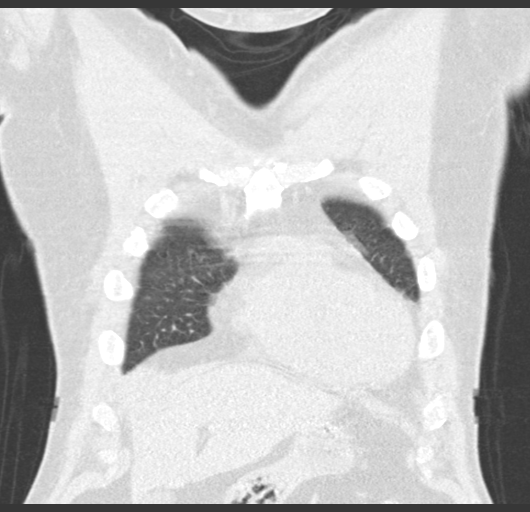
[im 61/151  lung]
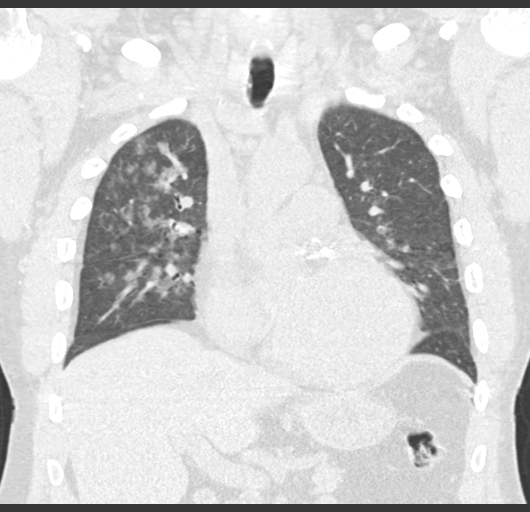
[im 91/151  lung]
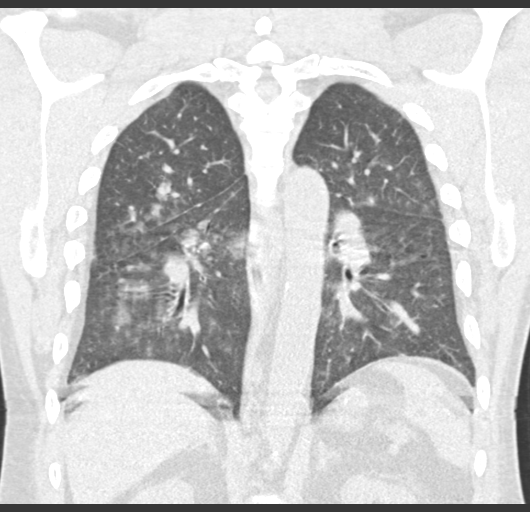

[15 of 36 positions shown; findings below may reference images not displayed]

Images of the lung bases are compared with the
prior CT examination of the abdomen and pelvis of 03/08/2020
FINDINGS: Cardiovascular: Extensive multi-vessel coronary artery
calcification. Mild global cardiomegaly. No pericardial effusion.
The central pulmonary arteries are enlarged in keeping with changes
of pulmonary arterial hypertension. Mild atherosclerotic
calcification is seen within the thoracic aorta. No aortic aneurysm.

Mediastinum/Nodes: Visualized thyroid is unremarkable. There is
shotty mediastinal adenopathy within the right paratracheal,
prevascular, aortopulmonary, and subcarinal lymph node groups
possibly reactive in nature. Index lymph node measures 20 mm in
short axis diameter in the subcarinal lymph node group. The
esophagus is patulous and air-filled, a finding that can be seen in
the setting of gastroesophageal reflux or esophageal dysmotility.
Small hiatal hernia.

Lungs/Pleura: There is extensive asymmetric, centrilobular nodular
infiltrates and tree-in-bud opacities within the lungs bilaterally
most in keeping with acute infectious bronchiolitis. Within the lung
bases, this appears new since prior CT examination of 03/08/2020.
There is trace superimposed interstitial pulmonary edema, best
appreciated within the lung bases. No central obstructing mass. No
pneumothorax or pleural effusion.

Upper Abdomen: No acute abnormality.

Musculoskeletal: No chest wall mass or suspicious bone lesions
identified.
IMPRESSION: Widespread asymmetric pulmonary infiltrates most in keeping with
infectious bronchiolitis in the acute setting. Follow-up chest
radiograph or repeat CT imaging following conservative therapy would
be helpful in documenting resolution.

Patulous air-filled esophagus, a finding that can be seen with
esophageal dysmotility or gastroesophageal reflux.

Extensive coronary artery calcification. Mild global cardiomegaly.
Morphologic changes in keeping with pulmonary arterial hypertension.
Trace interstitial pulmonary edema may be cardiogenic in nature.

Aortic Atherosclerosis ([id number]).

## 2023-04-26 ENCOUNTER — Telehealth: Payer: Self-pay | Admitting: Family

## 2023-04-26 ENCOUNTER — Inpatient Hospital Stay: Payer: Medicare Other | Admitting: Family Medicine

## 2023-04-26 NOTE — Telephone Encounter (Signed)
Called pt and left vm to call office back to reschedule missed HFU appt

## 2023-05-09 ENCOUNTER — Emergency Department (HOSPITAL_COMMUNITY): Payer: Medicare Other

## 2023-05-09 ENCOUNTER — Inpatient Hospital Stay (HOSPITAL_COMMUNITY)
Admission: EM | Admit: 2023-05-09 | Discharge: 2023-05-11 | DRG: 391 | Disposition: A | Discharge status: Home / Self Care | Payer: Medicare Other | Attending: Internal Medicine | Admitting: Internal Medicine

## 2023-05-09 ENCOUNTER — Other Ambulatory Visit: Payer: Self-pay

## 2023-05-09 ENCOUNTER — Encounter (HOSPITAL_COMMUNITY): Payer: Self-pay

## 2023-05-09 DIAGNOSIS — E1122 Type 2 diabetes mellitus with diabetic chronic kidney disease: Secondary | ICD-10-CM | POA: Diagnosis not present

## 2023-05-09 DIAGNOSIS — D631 Anemia in chronic kidney disease: Secondary | ICD-10-CM | POA: Diagnosis present

## 2023-05-09 DIAGNOSIS — R7989 Other specified abnormal findings of blood chemistry: Secondary | ICD-10-CM

## 2023-05-09 DIAGNOSIS — Z8701 Personal history of pneumonia (recurrent): Secondary | ICD-10-CM

## 2023-05-09 DIAGNOSIS — Z8249 Family history of ischemic heart disease and other diseases of the circulatory system: Secondary | ICD-10-CM

## 2023-05-09 DIAGNOSIS — Z8616 Personal history of COVID-19: Secondary | ICD-10-CM

## 2023-05-09 DIAGNOSIS — Z992 Dependence on renal dialysis: Secondary | ICD-10-CM

## 2023-05-09 DIAGNOSIS — R079 Chest pain, unspecified: Principal | ICD-10-CM | POA: Diagnosis present

## 2023-05-09 DIAGNOSIS — K219 Gastro-esophageal reflux disease without esophagitis: Principal | ICD-10-CM | POA: Diagnosis present

## 2023-05-09 DIAGNOSIS — Z79899 Other long term (current) drug therapy: Secondary | ICD-10-CM

## 2023-05-09 DIAGNOSIS — I5030 Unspecified diastolic (congestive) heart failure: Secondary | ICD-10-CM | POA: Diagnosis present

## 2023-05-09 DIAGNOSIS — N186 End stage renal disease: Secondary | ICD-10-CM

## 2023-05-09 DIAGNOSIS — Z6830 Body mass index (BMI) 30.0-30.9, adult: Secondary | ICD-10-CM

## 2023-05-09 DIAGNOSIS — E669 Obesity, unspecified: Secondary | ICD-10-CM | POA: Insufficient documentation

## 2023-05-09 DIAGNOSIS — Z91158 Patient's noncompliance with renal dialysis for other reason: Secondary | ICD-10-CM

## 2023-05-09 DIAGNOSIS — Z8719 Personal history of other diseases of the digestive system: Secondary | ICD-10-CM

## 2023-05-09 DIAGNOSIS — I10 Essential (primary) hypertension: Secondary | ICD-10-CM | POA: Diagnosis present

## 2023-05-09 DIAGNOSIS — H548 Legal blindness, as defined in USA: Secondary | ICD-10-CM | POA: Diagnosis present

## 2023-05-09 DIAGNOSIS — R072 Precordial pain: Secondary | ICD-10-CM | POA: Diagnosis not present

## 2023-05-09 DIAGNOSIS — N189 Chronic kidney disease, unspecified: Secondary | ICD-10-CM | POA: Diagnosis present

## 2023-05-09 DIAGNOSIS — I2489 Other forms of acute ischemic heart disease: Secondary | ICD-10-CM | POA: Diagnosis present

## 2023-05-09 DIAGNOSIS — I4510 Unspecified right bundle-branch block: Secondary | ICD-10-CM | POA: Diagnosis present

## 2023-05-09 DIAGNOSIS — I5032 Chronic diastolic (congestive) heart failure: Secondary | ICD-10-CM | POA: Diagnosis present

## 2023-05-09 DIAGNOSIS — I132 Hypertensive heart and chronic kidney disease with heart failure and with stage 5 chronic kidney disease, or end stage renal disease: Secondary | ICD-10-CM | POA: Diagnosis present

## 2023-05-09 DIAGNOSIS — Z5982 Transportation insecurity: Secondary | ICD-10-CM

## 2023-05-09 DIAGNOSIS — M898X9 Other specified disorders of bone, unspecified site: Secondary | ICD-10-CM | POA: Diagnosis present

## 2023-05-09 DIAGNOSIS — I420 Dilated cardiomyopathy: Secondary | ICD-10-CM | POA: Diagnosis present

## 2023-05-09 DIAGNOSIS — E1151 Type 2 diabetes mellitus with diabetic peripheral angiopathy without gangrene: Secondary | ICD-10-CM | POA: Diagnosis present

## 2023-05-09 DIAGNOSIS — E119 Type 2 diabetes mellitus without complications: Secondary | ICD-10-CM

## 2023-05-09 DIAGNOSIS — I058 Other rheumatic mitral valve diseases: Secondary | ICD-10-CM

## 2023-05-09 DIAGNOSIS — I2510 Atherosclerotic heart disease of native coronary artery without angina pectoris: Secondary | ICD-10-CM | POA: Diagnosis present

## 2023-05-09 DIAGNOSIS — I5033 Acute on chronic diastolic (congestive) heart failure: Secondary | ICD-10-CM

## 2023-05-09 LAB — CBC WITH DIFFERENTIAL/PLATELET
Abs Immature Granulocytes: 0.01 10*3/uL (ref 0.00–0.07)
Basophils Absolute: 0 10*3/uL (ref 0.0–0.1)
Basophils Relative: 0 %
Eosinophils Absolute: 0.2 10*3/uL (ref 0.0–0.5)
Eosinophils Relative: 4 %
HCT: 34.7 % — ABNORMAL LOW (ref 39.0–52.0)
Hemoglobin: 11.3 g/dL — ABNORMAL LOW (ref 13.0–17.0)
Immature Granulocytes: 0 %
Lymphocytes Relative: 24 %
Lymphs Abs: 1.5 10*3/uL (ref 0.7–4.0)
MCH: 31.1 pg (ref 26.0–34.0)
MCHC: 32.6 g/dL (ref 30.0–36.0)
MCV: 95.6 fL (ref 80.0–100.0)
Monocytes Absolute: 0.7 10*3/uL (ref 0.1–1.0)
Monocytes Relative: 11 %
Neutro Abs: 4 10*3/uL (ref 1.7–7.7)
Neutrophils Relative %: 61 %
Platelets: 178 10*3/uL (ref 150–400)
RBC: 3.63 MIL/uL — ABNORMAL LOW (ref 4.22–5.81)
RDW: 16.8 % — ABNORMAL HIGH (ref 11.5–15.5)
WBC: 6.5 10*3/uL (ref 4.0–10.5)
nRBC: 0 % (ref 0.0–0.2)

## 2023-05-09 LAB — COMPREHENSIVE METABOLIC PANEL
ALT: 14 U/L (ref 0–44)
AST: 22 U/L (ref 15–41)
Albumin: 3.2 g/dL — ABNORMAL LOW (ref 3.5–5.0)
Alkaline Phosphatase: 85 U/L (ref 38–126)
Anion gap: 15 (ref 5–15)
BUN: 32 mg/dL — ABNORMAL HIGH (ref 6–20)
CO2: 26 mmol/L (ref 22–32)
Calcium: 8.6 mg/dL — ABNORMAL LOW (ref 8.9–10.3)
Chloride: 93 mmol/L — ABNORMAL LOW (ref 98–111)
Creatinine, Ser: 10.76 mg/dL — ABNORMAL HIGH (ref 0.61–1.24)
GFR, Estimated: 5 mL/min — ABNORMAL LOW (ref >60)
Glucose, Bld: 162 mg/dL — ABNORMAL HIGH (ref 70–99)
Potassium: 4.1 mmol/L (ref 3.5–5.1)
Sodium: 134 mmol/L — ABNORMAL LOW (ref 135–145)
Total Bilirubin: 0.6 mg/dL (ref <1.2)
Total Protein: 7.4 g/dL (ref 6.5–8.1)

## 2023-05-09 LAB — TROPONIN I (HIGH SENSITIVITY)
Troponin I (High Sensitivity): 59 ng/L — ABNORMAL HIGH (ref <18)
Troponin I (High Sensitivity): 103 ng/L (ref <18)
Troponin I (High Sensitivity): 140 ng/L (ref <18)
Troponin I (High Sensitivity): 275 ng/L (ref <18)
Troponin I (High Sensitivity): 207 ng/L (ref <18)

## 2023-05-09 LAB — HEPATITIS B SURFACE ANTIGEN: Hepatitis B Surface Ag: NONREACTIVE

## 2023-05-09 LAB — PHOSPHORUS: Phosphorus: 7.5 mg/dL — ABNORMAL HIGH (ref 2.5–4.6)

## 2023-05-09 LAB — LIPASE, BLOOD: Lipase: 59 U/L — ABNORMAL HIGH (ref 11–51)

## 2023-05-09 MED ORDER — HEPARIN BOLUS VIA INFUSION
4000.0000 [IU] | Freq: Once | INTRAVENOUS | Status: AC
  Administered 2023-05-09: 4000 [IU] via INTRAVENOUS
  Filled 2023-05-09: qty 4000

## 2023-05-09 MED ORDER — SODIUM CHLORIDE 0.9% FLUSH: 3.0000 mL | INTRAVENOUS | Status: DC | PRN

## 2023-05-09 MED ORDER — PANTOPRAZOLE SODIUM 20 MG PO TBEC
20.0000 mg | DELAYED_RELEASE_TABLET | Freq: Every day | ORAL | Status: DC
  Administered 2023-05-09 – 2023-05-11 (×2): 20 mg via ORAL
  Filled 2023-05-09 (×2): qty 1

## 2023-05-09 MED ORDER — HEPARIN SODIUM (PORCINE) 5000 UNIT/ML IJ SOLN
5000.0000 [IU] | Freq: Three times a day (TID) | INTRAMUSCULAR | Status: DC
  Administered 2023-05-09 – 2023-05-11 (×6): 5000 [IU] via SUBCUTANEOUS
  Filled 2023-05-09 (×7): qty 1

## 2023-05-09 MED ORDER — IRBESARTAN 150 MG PO TABS
150.0000 mg | ORAL_TABLET | Freq: Every day | ORAL | Status: DC
  Administered 2023-05-09 – 2023-05-11 (×2): 150 mg via ORAL
  Filled 2023-05-09 (×2): qty 1

## 2023-05-09 MED ORDER — ALUM & MAG HYDROXIDE-SIMETH 200-200-20 MG/5ML PO SUSP
30.0000 mL | Freq: Once | ORAL | Status: AC
  Administered 2023-05-09: 30 mL via ORAL
  Filled 2023-05-09: qty 30

## 2023-05-09 MED ORDER — AMLODIPINE BESYLATE 5 MG PO TABS
5.0000 mg | ORAL_TABLET | Freq: Every day | ORAL | Status: DC
  Administered 2023-05-09 – 2023-05-11 (×2): 5 mg via ORAL
  Filled 2023-05-09 (×2): qty 1

## 2023-05-09 MED ORDER — PANTOPRAZOLE SODIUM 40 MG IV SOLR
40.0000 mg | Freq: Once | INTRAVENOUS | Status: AC
  Administered 2023-05-09: 40 mg via INTRAVENOUS
  Filled 2023-05-09: qty 10

## 2023-05-09 MED ORDER — CARVEDILOL 25 MG PO TABS
25.0000 mg | ORAL_TABLET | Freq: Two times a day (BID) | ORAL | Status: DC
  Administered 2023-05-09 – 2023-05-11 (×2): 25 mg via ORAL
  Filled 2023-05-09: qty 2
  Filled 2023-05-09: qty 1

## 2023-05-09 MED ORDER — FAMOTIDINE 20 MG PO TABS
10.0000 mg | ORAL_TABLET | Freq: Every day | ORAL | Status: DC
  Administered 2023-05-09 – 2023-05-11 (×2): 10 mg via ORAL
  Filled 2023-05-09 (×2): qty 1

## 2023-05-09 MED ORDER — ASPIRIN 81 MG PO CHEW
324.0000 mg | CHEWABLE_TABLET | Freq: Once | ORAL | Status: AC
  Administered 2023-05-09: 324 mg via ORAL
  Filled 2023-05-09: qty 4

## 2023-05-09 MED ORDER — HEPARIN (PORCINE) 25000 UT/250ML-% IV SOLN
1300.0000 [IU]/h | INTRAVENOUS | Status: DC
  Administered 2023-05-09: 1300 [IU]/h via INTRAVENOUS
  Filled 2023-05-09: qty 250

## 2023-05-09 MED ORDER — SODIUM CHLORIDE 0.9 % IV SOLN: INTRAVENOUS | Status: DC

## 2023-05-09 MED ORDER — CALCITRIOL 0.5 MCG PO CAPS
1.5000 ug | ORAL_CAPSULE | ORAL | Status: DC
  Administered 2023-05-09: 1.5 ug via ORAL
  Filled 2023-05-09 (×3): qty 3

## 2023-05-09 MED ORDER — ATORVASTATIN CALCIUM 80 MG PO TABS
80.0000 mg | ORAL_TABLET | Freq: Every day | ORAL | Status: DC
  Administered 2023-05-11: 80 mg via ORAL
  Filled 2023-05-09: qty 1

## 2023-05-09 MED ORDER — CHLORHEXIDINE GLUCONATE CLOTH 2 % EX PADS: 6.0000 | MEDICATED_PAD | Freq: Every day | CUTANEOUS | Status: DC

## 2023-05-09 MED ORDER — CALCIUM ACETATE (PHOS BINDER) 667 MG PO CAPS
1334.0000 mg | ORAL_CAPSULE | Freq: Three times a day (TID) | ORAL | Status: DC
  Administered 2023-05-10 – 2023-05-11 (×2): 1334 mg via ORAL
  Filled 2023-05-09 (×2): qty 2

## 2023-05-09 MED ORDER — SODIUM CHLORIDE 0.9 % IV SOLN: 250.0000 mL | INTRAVENOUS | Status: AC | PRN

## 2023-05-09 MED ORDER — LIDOCAINE VISCOUS HCL 2 % MT SOLN
15.0000 mL | Freq: Once | OROMUCOSAL | Status: AC
  Administered 2023-05-09: 15 mL via ORAL
  Filled 2023-05-09: qty 15

## 2023-05-09 MED ORDER — SODIUM CHLORIDE 0.9% FLUSH
3.0000 mL | Freq: Two times a day (BID) | INTRAVENOUS | Status: DC
  Administered 2023-05-09 – 2023-05-11 (×3): 3 mL via INTRAVENOUS

## 2023-05-09 NOTE — ED Provider Notes (Signed)
[place] EMERGENCY DEPARTMENT AT [facility] Provider Note   CSN: [id number] Arrival date & time: 05/09/23  0219     History {Add pertinent medical, surgical, social history, OB history to HPI:1} Chief Complaint  Patient presents with   Chest Pain    Corey Park is a 52 y.o. male.  52 year old male that presents the ER today secondary to chest pain.  Patient states that he has a history of similar symptoms.  This 1 is burning, epigastric and has caused him to have nausea and vomiting.  He called EMS to bring him here.  States he does not really feel much better.  Still feels nauseous.  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  He is a dialysis patient with a fistula in his right arm.  He states that he goes Monday Wednesday Friday and he did go Monday.  No diarrhea or constipation.  No fevers.  No trauma.  EMS had sent an EKG prior to arrival that showed likely LVH.  Patient states he does have some heart issues but is unsure what they are.  He states he is also had hiccups for 3 years.   Chest Pain      Home Medications Prior to Admission medications   Medication Sig Start Date End Date Taking? Authorizing Provider  atorvastatin (LIPITOR) 80 MG tablet Take 1 tablet (80 mg total) by mouth daily. 12/15/22   Modena Slater, DO  calcium acetate (PHOSLO) 667 MG capsule Take 2 capsules (1,334 mg total) by mouth with breakfast, with lunch, and with evening meal. Patient taking differently: Take 2,001 mg by mouth with breakfast, with lunch, and with evening meal. 12/14/22   Modena Slater, DO  carvedilol (COREG) 25 MG tablet Take 1 tablet (25 mg total) by mouth 2 (two) times daily with a meal. 12/14/22   Modena Slater, DO  ferric citrate (AURYXIA) 1 GM 210 MG(Fe) tablet Take 2 tablets (420 mg total) by mouth daily. 12/14/22   Modena Slater, DO  gabapentin (NEURONTIN) 100 MG capsule Take 1 capsule (100 mg total) by mouth 2 (two) times daily. 04/03/23   Dorcas Carrow, MD  Methoxy PEG-Epoetin Beta (MIRCERA IJ) Inject 1 Syringe into the skin every 14 (fourteen) days. 03/21/23 03/19/24  [provider]  Multiple Vitamins-Minerals (CERTAVITE/ANTIOXIDANTS) TABS Take 1 tablet by mouth at bedtime. 10/20/22   Tiffany Kocher, DO  multivitamin (RENA-VIT) TABS tablet Take 1 tablet by mouth daily. 11/22/22   [provider]  nitroGLYCERIN (NITROSTAT) 0.4 MG SL tablet Place 0.4 mg under the tongue every 5 (five) minutes as needed for chest pain. 12/19/22   [provider]  olmesartan (BENICAR) 20 MG tablet Take 1 tablet (20 mg total) by mouth daily. 12/14/22   Modena Slater, DO      Allergies    Patient has no known allergies.    Review of Systems   Review of Systems  Cardiovascular:  Positive for chest pain.    Physical Exam Updated Vital Signs BP (!) 152/87   Pulse 88   Temp 98.3 F (36.8 C)   Resp 18   SpO2 100%  Physical Exam Vitals and nursing note reviewed.  Constitutional:      Appearance: He is well-developed.  HENT:     Head: Normocephalic and atraumatic.  Cardiovascular:     Rate and Rhythm: Normal rate.  Pulmonary:     Effort: Pulmonary effort is normal. No tachypnea or  respiratory distress.     Breath sounds: Decreased breath sounds present. No wheezing.  Abdominal:     General: There is no distension.     Comments: Mild ttp epigastric  Musculoskeletal:        General: Normal range of motion.     Cervical back: Normal range of motion.     Right lower leg: No edema.     Left lower leg: Edema present.  Neurological:     Mental Status: He is alert.     Comments: baseline     ED Results / Procedures / Treatments   Labs (all labs ordered are listed, but only abnormal results are displayed) Labs Reviewed  CBC WITH DIFFERENTIAL/PLATELET  COMPREHENSIVE METABOLIC PANEL  LIPASE, BLOOD  TROPONIN I (HIGH SENSITIVITY)    EKG EKG Interpretation Date/Time:  Wednesday May 09 2023 02:22:21 EST Ventricular  Rate:  86 PR Interval:  181 QRS Duration:  113 QT Interval:  426 QTC Calculation: 510 R Axis:   -28  Text Interpretation: Sinus rhythm Incomplete right bundle branch block LVH with secondary repolarization abnormality Anterior ST elevation, probably due to LVH Prolonged QT interval very similar to 2023/04/12 with some more pronounced t waves than previously Confirmed by Marily Memos [phone]) on 05/09/2023 2:24:57 AM  Radiology No results found.  Procedures Procedures  {Document cardiac monitor, telemetry assessment procedure when appropriate:1}  Medications Ordered in ED Medications  alum & mag hydroxide-simeth (MAALOX/MYLANTA) 200-200-20 MG/5ML suspension 30 mL (has no administration in time range)    And  lidocaine (XYLOCAINE) 2 % viscous mouth solution 15 mL (has no administration in time range)  pantoprazole (PROTONIX) injection 40 mg (has no administration in time range)    ED Course/ Medical Decision Making/ A&P   {   Click here for ABCD2, HEART and other calculatorsREFRESH Note before signing :1}                              Medical Decision Making Amount and/or Complexity of Data Reviewed Labs: ordered. ECG/medicine tests: ordered.  Risk OTC drugs. Prescription drug management.  52 year old male with history of end-stage renal disease on dialysis Monday Wednesday Friday compliant the presents ER today with epigastric pain that is burning with nausea vomiting and hiccups.  He has been seen multiple times in the past with chest pain and has a baseline troponin.  The vast majority this time was then found to be noncardiac in nature.  This 1 seems more similar to a GI symptoms however obviously has multiple risk factors for coronary disease so we will rule that out with EKG and troponins.  EKG appears exactly the same as it did last month with no ST elevation outside of what would expect with repolarization abnormality associate with LVH.  However he does have likely more  significant newly elevated T waves than last month bringing up concern for possible hyperkalemia in a dialysis patient but do not appear to be hyperacute or do Winters waves.  Will treat symptomatically at this time pending workup. ***  {Document critical care time when appropriate:1} {Document review of labs and clinical decision tools ie heart score, Chads2Vasc2 etc:1}  {Document your independent review of radiology images, and any outside records:1} {Document your discussion with family members, caretakers, and with consultants:1} {Document social determinants of health affecting pt's care:1} {Document your decision making why or why not admission, treatments were needed:1} Final Clinical Impression(s) / ED Diagnoses Final  diagnoses:  None    Rx / DC Orders ED Discharge Orders     None

## 2023-05-09 NOTE — ED Notes (Signed)
ED TO INPATIENT HANDOFF REPORT  ED Nurse Name and Phone #: [phone]  S Name/Age/Gender Corey Park 52 y.o. male Room/Bed: 040C/040C  Code Status   Code Status: Full Code  Home/SNF/Other Home Patient oriented to: self, place, time, and situation Is this baseline? Yes   Triage Complete: Triage complete  Chief Complaint Chest pain [R07.9]  Triage Note Pt is coming in with an aching chest pain starting at 0120 this monring, 10/10 pain described by him. Pain is mid sternal in location, did have 1 episode of emsis with some continued nausea. Pt is alert currentyl and oriented. Medic reports some EKG changes that was sent to the MD. He is also a dialysis patient as well and had his dialysis on Monday.   Medic vitals  144/88 88hr 97%ra 18rr  18g left AC  324mg  asa given 0.4mg  nitroglycerin SL     Allergies No Known Allergies  Level of Care/Admitting Diagnosis ED Disposition     ED Disposition  Admit   Condition  --   Comment  Hospital Area: [facility] [[id number]]  Level of Care: Telemetry Cardiac [[id number]]  May place patient in observation at [facility] or [facility] if equivalent level of care is available:: No  Covid Evaluation: Asymptomatic - no recent exposure (last 10 days) testing not required  Diagnosis: Chest pain [474259]  Admitting Physician: Erenest Blank  Attending Physician: Erenest Blank          B Medical/Surgery History Past Medical History:  Diagnosis Date   Allergy, unspecified, initial encounter 08/25/2019   Asthma    as a child   Cellulitis, perineum 11/26/2019   CHF (congestive heart failure) (HCC) 2021   Complication of vascular dialysis catheter 08/25/2019   COVID-19 virus infection 07/27/2019   Diabetes mellitus without complication (HCC)    type 2   Dilated cardiomyopathy (HCC) 07/03/2018   ESRD on hemodialysis (HCC) 06/17/2018   MWF at [facility]   Fe deficiency anemia 12/23/2018    Hypertension    Legally blind    B/L   Pneumonia 2021   Type 2 diabetes mellitus with diabetic peripheral angiopathy without gangrene (HCC) 08/25/2019   Unspecified protein-calorie malnutrition (HCC) 08/25/2019   Past Surgical History:  Procedure Laterality Date   BASCILIC VEIN TRANSPOSITION Right 10/16/2019   Procedure: Basilic Vein Transposition Right Arm;  Surgeon: Nada Libman, MD;  Location: [facility] OR;  Service: Vascular;  Laterality: Right;   BASCILIC VEIN TRANSPOSITION Right 01/29/2020   Procedure: RIGHT ARM SECOND STAGE BASCILIC VEIN TRANSPOSITION;  Surgeon: Nada Libman, MD;  Location: [facility] OR;  Service: Vascular;  Laterality: Right;   BIOPSY  12/22/2018   Procedure: BIOPSY;  Surgeon: Carman Ching, MD;  Location: [facility] ENDOSCOPY;  Service: Endoscopy;;   CORONARY ULTRASOUND/IVUS N/A 12/13/2022   Procedure: Coronary Ultrasound/IVUS;  Surgeon: Corky Crafts, MD;  Location: [facility] INVASIVE CV LAB;  Service: Cardiovascular;  Laterality: N/A;   ESOPHAGOGASTRODUODENOSCOPY N/A 12/22/2018   Procedure: ESOPHAGOGASTRODUODENOSCOPY (EGD);  Surgeon: Carman Ching, MD;  Location: [facility] ENDOSCOPY;  Service: Endoscopy;  Laterality: N/A;   IR FLUORO GUIDE CV LINE RIGHT  07/29/2019   IR US GUIDE VASC ACCESS RIGHT  07/29/2019   LEFT HEART CATH AND CORONARY ANGIOGRAPHY N/A 12/13/2022   Procedure: LEFT HEART CATH AND CORONARY ANGIOGRAPHY;  Surgeon: Corky Crafts, MD;  Location: [facility] INVASIVE CV LAB;  Service: Cardiovascular;  Laterality: N/A;     A IV Location/Drains/Wounds Patient Lines/Drains/Airways Status  Active Line/Drains/Airways     Name Placement date Placement time Site Days   Peripheral IV 05/09/23 20 G Left;Posterior Forearm 05/09/23  0227  Forearm  less than 1   Fistula / Graft Right Upper arm Arteriovenous fistula 10/16/19  0833  Upper arm  1301   Wound / Incision (Open or Dehisced) 10/19/22 Laceration Lip Right 10/19/22  2100  Lip  202            Intake/Output Last 24  hours No intake or output data in the 24 hours ending 05/09/23 1946  Labs/Imaging Results for orders placed or performed during the hospital encounter of 05/09/23 (from the past 48 hour(s))  CBC with Differential     Status: Abnormal   Collection Time: 05/09/23  2:27 AM  Result Value Ref Range   WBC 6.5 4.0 - 10.5 K/uL   RBC 3.63 (L) 4.22 - 5.81 MIL/uL   Hemoglobin 11.3 (L) 13.0 - 17.0 g/dL   HCT 40.9 (L) 81.1 - 91.4 %   MCV 95.6 80.0 - 100.0 fL   MCH 31.1 26.0 - 34.0 pg   MCHC 32.6 30.0 - 36.0 g/dL   RDW 78.2 (H) 95.6 - 21.3 %   Platelets 178 150 - 400 K/uL   nRBC 0.0 0.0 - 0.2 %   Neutrophils Relative % 61 %   Neutro Abs 4.0 1.7 - 7.7 K/uL   Lymphocytes Relative 24 %   Lymphs Abs 1.5 0.7 - 4.0 K/uL   Monocytes Relative 11 %   Monocytes Absolute 0.7 0.1 - 1.0 K/uL   Eosinophils Relative 4 %   Eosinophils Absolute 0.2 0.0 - 0.5 K/uL   Basophils Relative 0 %   Basophils Absolute 0.0 0.0 - 0.1 K/uL   Immature Granulocytes 0 %   Abs Immature Granulocytes 0.01 0.00 - 0.07 K/uL    Comment: Performed at [facility], [address] [place], [place] [address]  Comprehensive metabolic panel     Status: Abnormal   Collection Time: 05/09/23  2:27 AM  Result Value Ref Range   Sodium 134 (L) 135 - 145 mmol/L   Potassium 4.1 3.5 - 5.1 mmol/L   Chloride 93 (L) 98 - 111 mmol/L   CO2 26 22 - 32 mmol/L   Glucose, Bld 162 (H) 70 - 99 mg/dL    Comment: Glucose reference range applies only to samples taken after fasting for at least 8 hours.   BUN 32 (H) 6 - 20 mg/dL   Creatinine, Ser 84.69 (H) 0.61 - 1.24 mg/dL   Calcium 8.6 (L) 8.9 - 10.3 mg/dL   Total Protein 7.4 6.5 - 8.1 g/dL   Albumin 3.2 (L) 3.5 - 5.0 g/dL   AST 22 15 - 41 U/L   ALT 14 0 - 44 U/L   Alkaline Phosphatase 85 38 - 126 U/L   Total Bilirubin 0.6 <1.2 mg/dL   GFR, Estimated 5 (L) >60 mL/min    Comment: (NOTE) Calculated using the CKD-EPI Creatinine Equation (2021)    Anion gap 15 5 - 15    Comment: Performed  at [facility], [address], [place], [place] [address]  Lipase, blood     Status: Abnormal   Collection Time: 05/09/23  2:27 AM  Result Value Ref Range   Lipase 59 (H) 11 - 51 U/L    Comment: Performed at [facility], [address], [place], [place] [address]  Troponin I (High Sensitivity)     Status:  Abnormal   Collection Time: 05/09/23  2:27 AM  Result Value Ref Range   Troponin I (High Sensitivity) 59 (H) <18 ng/L    Comment: (NOTE) Elevated high sensitivity troponin I (hsTnI) values and significant  changes across serial measurements may suggest ACS but many other  chronic and acute conditions are known to elevate hsTnI results.  Refer to the "Links" section for chest pain algorithms and additional  guidance. Performed at [facility], [address] [place], [place] [address]   Troponin I (High Sensitivity)     Status: Abnormal   Collection Time: 05/09/23  5:12 AM  Result Value Ref Range   Troponin I (High Sensitivity) 103 (HH) <18 ng/L    Comment: CRITICAL RESULT CALLED TO, READ BACK BY AND VERIFIED WITH TERRY, R. RN @ [phone] 05/09/23 JBUTLER (NOTE) Elevated high sensitivity troponin I (hsTnI) values and significant  changes across serial measurements may suggest ACS but many other  chronic and acute conditions are known to elevate hsTnI results.  Refer to the "Links" section for chest pain algorithms and additional  guidance. Performed at [facility], [address], [place], [place] [address]   Troponin I (High Sensitivity)     Status: Abnormal   Collection Time: 05/09/23  7:03 AM  Result Value Ref Range   Troponin I (High Sensitivity) 140 (HH) <18 ng/L    Comment: CRITICAL VALUE NOTED. VALUE IS CONSISTENT WITH PREVIOUSLY REPORTED/CALLED VALUE (NOTE) Elevated high sensitivity troponin I (hsTnI) values and significant  changes across serial measurements may suggest ACS but many other  chronic and acute conditions are known to elevate  hsTnI results.  Refer to the "Links" section for chest pain algorithms and additional  guidance. Performed at [facility], [address] [place], [place] [address]   Troponin I (High Sensitivity)     Status: Abnormal   Collection Time: 05/09/23 12:04 PM  Result Value Ref Range   Troponin I (High Sensitivity) 275 (HH) <18 ng/L    Comment: CRITICAL VALUE NOTED. VALUE IS CONSISTENT WITH PREVIOUSLY REPORTED/CALLED VALUE DELTA CHECK NOTED (NOTE) Elevated high sensitivity troponin I (hsTnI) values and significant  changes across serial measurements may suggest ACS but many other  chronic and acute conditions are known to elevate hsTnI results.  Refer to the "Links" section for chest pain algorithms and additional  guidance. Performed at [facility], [address] [place], [place] [address]    DG Chest Portable 1 View  Result Date: 05/09/2023 CLINICAL DATA:  Midsternal chest pain EXAM: PORTABLE CHEST 1 VIEW COMPARISON:  04/02/2023 FINDINGS: Cardiomegaly and congested appearance of vessels. Normal appearance of the aortic contours. Accentuated interstitial lung markings are improved. No effusion or pneumothorax. No osseous finding. Artifact from EKG leads. IMPRESSION: Cardiomegaly and vascular congestion. Electronically Signed   By: Tiburcio Pea M.D.   On: 05/09/2023 04:37    Pending Labs Unresulted Labs (From admission, onward)     Start     Ordered   05/10/23 0500  HIV Antibody (routine testing w rflx)  (HIV Antibody (Routine testing w reflex) panel)  Tomorrow morning,   R        05/09/23 1623   05/10/23 0500  Basic metabolic panel  Tomorrow morning,   R        05/09/23 1623   05/09/23 1836  Phosphorus  Add-on,   AD        05/09/23 1835   05/09/23 1720  Culture, blood (  Routine X 2) w Reflex to ID Panel  BLOOD CULTURE X 2,   R      05/09/23 1719   05/09/23 1704  Hepatitis B surface antigen  (New Admission Hemo Labs (Hepatitis B))  Once,   R        05/09/23 1704    05/09/23 1704  Hepatitis B surface antibody,quantitative  (New Admission Hemo Labs (Hepatitis B))  Once,   R        05/09/23 1704            Vitals/Pain Today's Vitals   05/09/23 1400 05/09/23 1939 05/09/23 1939 05/09/23 1940  BP:    126/64  Pulse:      Resp:    12  Temp:   97.7 F (36.5 C)   TempSrc:   Oral   SpO2:    100%  Weight: 104 kg     Height: 6\' 1"  (1.854 m)     PainSc:  0-No pain      Isolation Precautions No active isolations  Medications Medications  amLODipine (NORVASC) tablet 5 mg (5 mg Oral Given 05/09/23 1750)  atorvastatin (LIPITOR) tablet 80 mg (has no administration in time range)  carvedilol (COREG) tablet 25 mg (25 mg Oral Given 05/09/23 1750)  irbesartan (AVAPRO) tablet 150 mg (150 mg Oral Given 05/09/23 1752)  famotidine (PEPCID) tablet 10 mg (10 mg Oral Given 05/09/23 1751)  pantoprazole (PROTONIX) EC tablet 20 mg (20 mg Oral Given 05/09/23 1933)  heparin injection 5,000 Units (5,000 Units Subcutaneous Given 05/09/23 1751)  sodium chloride flush (NS) 0.9 % injection 3 mL (has no administration in time range)  sodium chloride flush (NS) 0.9 % injection 3 mL (has no administration in time range)  0.9 %  sodium chloride infusion (has no administration in time range)  Chlorhexidine Gluconate Cloth 2 % PADS 6 each (has no administration in time range)  calcium acetate (PHOSLO) capsule 1,334 mg (has no administration in time range)  calcitRIOL (ROCALTROL) capsule 1.5 mcg (has no administration in time range)  alum & mag hydroxide-simeth (MAALOX/MYLANTA) 200-200-20 MG/5ML suspension 30 mL (30 mLs Oral Given 05/09/23 0232)    And  lidocaine (XYLOCAINE) 2 % viscous mouth solution 15 mL (15 mLs Oral Given 05/09/23 0232)  pantoprazole (PROTONIX) injection 40 mg (40 mg Intravenous Given 05/09/23 0232)  aspirin chewable tablet 324 mg (324 mg Oral Given 05/09/23 0903)  heparin bolus via infusion 4,000 Units (4,000 Units Intravenous Bolus from Bag 05/09/23  1447)    Mobility walks     Focused Assessments Renal Assessment Handoff:  Hemodialysis Schedule: Hemodialysis Schedule: Monday/Wednesday/Friday Last Hemodialysis date and time: 05/07/23   Restricted appendage: right    R Recommendations: See Admitting Provider Note  Report given to:   Additional Notes: Patient is A&O X4, he is blind and only sees shadows. Please call with any questions.

## 2023-05-09 NOTE — Consult Note (Signed)
Renal Service Consult Note [facility]  Corey Park 05/09/2023 Corey Krabbe, MD Requesting Physician: Dr. Ashok Pall  Reason for Consult: ESRD pt w/ chest pain HPI: The patient is a 52 y.o. year-old w/ PMH as below who presented to ED w/ c/o chest pain. 10/10 pain early am today. No cough or fever. Had HD Monday. Pt admitted for CP work-up.  We are asked to see for dialysis.   Pt seen in ED.  BP's are 150 - 170/ 80- 95 in ED, HR 70, RR 10-15, afebrile.   Pt is w/o c/o's at this time.  Pt states he is visually impaired.     ROS - denies CP, no joint pain, no HA, no blurry vision, no rash, no diarrhea, no nausea/ vomiting   Past Medical History  Past Medical History:  Diagnosis Date   Allergy, unspecified, initial encounter 08/25/2019   Asthma    as a child   Cellulitis, perineum 11/26/2019   CHF (congestive heart failure) (HCC) 2021   Complication of vascular dialysis catheter 08/25/2019   COVID-19 virus infection 07/27/2019   Diabetes mellitus without complication (HCC)    type 2   Dilated cardiomyopathy (HCC) 07/03/2018   ESRD on hemodialysis (HCC) 06/17/2018   MWF at [facility]   Fe deficiency anemia 12/23/2018   Hypertension    Legally blind    B/L   Pneumonia 2021   Type 2 diabetes mellitus with diabetic peripheral angiopathy without gangrene (HCC) 08/25/2019   Unspecified protein-calorie malnutrition (HCC) 08/25/2019   Past Surgical History  Past Surgical History:  Procedure Laterality Date   BASCILIC VEIN TRANSPOSITION Right 10/16/2019   Procedure: Basilic Vein Transposition Right Arm;  Surgeon: Nada Libman, MD;  Location: [facility] OR;  Service: Vascular;  Laterality: Right;   BASCILIC VEIN TRANSPOSITION Right 01/29/2020   Procedure: RIGHT ARM SECOND STAGE BASCILIC VEIN TRANSPOSITION;  Surgeon: Nada Libman, MD;  Location: [facility] OR;  Service: Vascular;  Laterality: Right;   BIOPSY  12/22/2018   Procedure: BIOPSY;  Surgeon: Carman Ching, MD;  Location:  [facility] ENDOSCOPY;  Service: Endoscopy;;   CORONARY ULTRASOUND/IVUS N/A 12/13/2022   Procedure: Coronary Ultrasound/IVUS;  Surgeon: Corky Crafts, MD;  Location: [facility] INVASIVE CV LAB;  Service: Cardiovascular;  Laterality: N/A;   ESOPHAGOGASTRODUODENOSCOPY N/A 12/22/2018   Procedure: ESOPHAGOGASTRODUODENOSCOPY (EGD);  Surgeon: Carman Ching, MD;  Location: [facility] ENDOSCOPY;  Service: Endoscopy;  Laterality: N/A;   IR FLUORO GUIDE CV LINE RIGHT  07/29/2019   IR US GUIDE VASC ACCESS RIGHT  07/29/2019   LEFT HEART CATH AND CORONARY ANGIOGRAPHY N/A 12/13/2022   Procedure: LEFT HEART CATH AND CORONARY ANGIOGRAPHY;  Surgeon: Corky Crafts, MD;  Location: [facility] INVASIVE CV LAB;  Service: Cardiovascular;  Laterality: N/A;   Family History  Family History  Problem Relation Age of Onset   CAD Mother    Hypertension Mother    Diabetes Neg Hx    Stroke Neg Hx    Cancer Neg Hx    Kidney failure Neg Hx    Stomach cancer Neg Hx    Colon cancer Neg Hx    Rectal cancer Neg Hx    Social History  reports that he has never smoked. He has never used smokeless tobacco. He reports that he does not currently use alcohol. He reports that he does not currently use drugs after having used the following drugs: Marijuana. Allergies No Known Allergies Home medications Prior to Admission medications   Medication Sig Start Date End Date  Taking? Authorizing Provider  atorvastatin (LIPITOR) 80 MG tablet Take 1 tablet (80 mg total) by mouth daily. 12/15/22  Yes Modena Slater, DO  calcium acetate (PHOSLO) 667 MG capsule Take 2 capsules (1,334 mg total) by mouth with breakfast, with lunch, and with evening meal. Patient taking differently: Take 2,001 mg by mouth with breakfast, with lunch, and with evening meal. 12/14/22  Yes Modena Slater, DO  carvedilol (COREG) 25 MG tablet Take 1 tablet (25 mg total) by mouth 2 (two) times daily with a meal. 12/14/22  Yes Modena Slater, DO  famotidine (PEPCID) 20 MG tablet Take 20 mg by mouth daily.  04/20/23  Yes [provider]  ferric citrate (AURYXIA) 1 GM 210 MG(Fe) tablet Take 2 tablets (420 mg total) by mouth daily. 12/14/22  Yes Modena Slater, DO  gabapentin (NEURONTIN) 100 MG capsule Take 1 capsule (100 mg total) by mouth 2 (two) times daily. 04/03/23  Yes Dorcas Carrow, MD  Methoxy PEG-Epoetin Beta (MIRCERA IJ) Inject 1 Syringe into the skin every 14 (fourteen) days. 03/21/23 03/19/24 Yes [provider]  multivitamin (RENA-VIT) TABS tablet Take 1 tablet by mouth daily. 11/22/22  Yes [provider]  olmesartan (BENICAR) 20 MG tablet Take 1 tablet (20 mg total) by mouth daily. 12/14/22  Yes Patel, Amar, DO  pantoprazole (PROTONIX) 20 MG tablet Take 20 mg by mouth daily. 04/19/23  Yes [provider]  pantoprazole (PROTONIX) 40 MG tablet Take 40 mg by mouth daily as needed. 04/18/23  Yes [provider]  nitroGLYCERIN (NITROSTAT) 0.4 MG SL tablet Place 0.4 mg under the tongue every 5 (five) minutes as needed for chest pain. Patient not taking: Reported on 05/09/2023 12/19/22   [provider]  ondansetron (ZOFRAN-ODT) 4 MG disintegrating tablet Take 4 mg by mouth every 8 (eight) hours as needed for nausea or vomiting. Patient not taking: Reported on 05/09/2023 04/20/23   [provider]     Vitals:   05/09/23 1003 05/09/23 1330 05/09/23 1349 05/09/23 1400  BP: (!) 159/92 (!) 174/100 (!) 174/100   Pulse: 68  67   Resp: 17 10 12    Temp:   98 F (36.7 C)   TempSrc:   Oral   SpO2: 97%  97%   Weight:    104 kg  Height:    6\' 1"  (1.854 m)   Exam Gen alert, no distress, on RA No rash, cyanosis or gangrene Sclera anicteric, throat clear  No jvd or bruits Chest clear bilat to bases, no rales/ wheezing RRR no RG Abd soft ntnd no mass or ascites +bs GU defer MS no joint effusions or deformity Ext no LE or UE edema, no wounds or ulcers Neuro is alert, Ox 3 , nf    RUA AVF+bruit       Renal-related home meds: - phoslo  2 ac - coreg 25 bid - auryxia 420 ac tid - gabapentin 100 bid - renavite - olmesartan 150 every day    OP HD: MWF [facility] 4h  450/1.5   100kg  2/2 bath   RUA AVF   Heparin none - last OP HD 11/11, post wt 103.6kg, coming over 3-6 kg over past 2 wks - rocaltrol 1.50 mcg - venofer 50mg  /wk - mircera 75 mcg IV q 2, last 11/06, due 11/20   Assessment/ Plan: Chest pain - w/u per primary team ESKD - on HD MWF. Last HD was Monday.  HTN - BP's mod high. 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  MBD ckd - CCa in range, add on phos. Cont binders.  Visual impairment      Vinson Moselle  MD [facility] 05/09/2023, 4:42 PM  Recent Labs  Lab 05/09/23 0227  HGB 11.3*  ALBUMIN 3.2*  CALCIUM 8.6*  CREATININE 10.76*  K 4.1   Inpatient medications:  amLODipine  5 mg Oral Daily   [START ON 05/10/2023] atorvastatin  80 mg Oral Daily   carvedilol  25 mg Oral BID WC   famotidine  20 mg Oral Daily   heparin  5,000 Units Subcutaneous Q8H   irbesartan  150 mg Oral Daily   pantoprazole  20 mg Oral Daily   sodium chloride flush  3 mL Intravenous Q12H    sodium chloride     sodium chloride, sodium chloride flush

## 2023-05-09 NOTE — Consult Note (Addendum)
Cardiology Consultation   Patient ID: Corey Park MRN: [id number]; DOB: 1970/12/26  Admit date: 05/09/2023 Date of Consult: 05/09/2023  PCP:  [name], NP   [facility] Providers Cardiologist:  Donato Schultz, MD   {  Patient Profile:   Corey Park is a 52 y.o. male with a hx of mild to moderate CAD noted on catheterization, heart failure with recovered EF, ESRD, hypertension, type 2 diabetes, hypertension, blind, who is being seen 05/09/2023 for the evaluation of chest pain at the request of Dr. Doran Durand.  History of Present Illness:   Corey Park patient has had previous cardiac catheterization on 12/13/2022 that demonstrated mild to moderate CAD with 25% stenosis of distal left main to proximal LAD, 50% proximal LAD, 25% ostial circumflex stenosis, but otherwise no significant stenosis.  He has had EF as well as 20 to 25% in 2021 however has had normalization of this since 2023.  He does not exhibit heart failure symptoms.  Most recent echocardiogram 04/03/2023 shows preserved EF 55 to 60% with mild LVH and normal RV function.  Currently patient is being evaluated for burning, epigastric chest pain has been going for the past 3 days.  Also reports nausea and vomiting.  He reports the symptoms only occur at night.  Nonexertional.  No radiation or association with diaphoresis or shortness of breath.  He states that he feels this chest pain when he lays flat at night, improves when sitting up straight.  Denies any acidic or spicy foods.  Denies any cough with this chest pain.  Reports compliancy with all of his medications however uncertain of what he takes.  ED provider initiated heparin.  EKG shows LVH repolarization abnormality stable on repeat EKG.  Troponins 59-23-140-275.  Chest x-ray showed cardiomegaly with vascular congestion.  Hemoglobin 11.3.  Creatinine 10.76, BUN 32.  Blood pressure elevated in the 170s.   Past Medical History:  Diagnosis Date    Allergy, unspecified, initial encounter 08/25/2019   Asthma    as a child   Cellulitis, perineum 11/26/2019   CHF (congestive heart failure) (HCC) 2021   Complication of vascular dialysis catheter 08/25/2019   COVID-19 virus infection 07/27/2019   Diabetes mellitus without complication (HCC)    type 2   Dilated cardiomyopathy (HCC) 07/03/2018   ESRD on hemodialysis (HCC) 06/17/2018   MWF at [facility]   Fe deficiency anemia 12/23/2018   Hypertension    Legally blind    B/L   Pneumonia 2021   Type 2 diabetes mellitus with diabetic peripheral angiopathy without gangrene (HCC) 08/25/2019   Unspecified protein-calorie malnutrition (HCC) 08/25/2019    Past Surgical History:  Procedure Laterality Date   BASCILIC VEIN TRANSPOSITION Right 10/16/2019   Procedure: Basilic Vein Transposition Right Arm;  Surgeon: Nada Libman, MD;  Location: [facility] OR;  Service: Vascular;  Laterality: Right;   BASCILIC VEIN TRANSPOSITION Right 01/29/2020   Procedure: RIGHT ARM SECOND STAGE BASCILIC VEIN TRANSPOSITION;  Surgeon: Nada Libman, MD;  Location: [facility] OR;  Service: Vascular;  Laterality: Right;   BIOPSY  12/22/2018   Procedure: BIOPSY;  Surgeon: Carman Ching, MD;  Location: [facility] ENDOSCOPY;  Service: Endoscopy;;   CORONARY ULTRASOUND/IVUS N/A 12/13/2022   Procedure: Coronary Ultrasound/IVUS;  Surgeon: Corky Crafts, MD;  Location: [facility] INVASIVE CV LAB;  Service: Cardiovascular;  Laterality: N/A;   ESOPHAGOGASTRODUODENOSCOPY N/A 12/22/2018   Procedure: ESOPHAGOGASTRODUODENOSCOPY (EGD);  Surgeon: Carman Ching, MD;  Location: [facility] ENDOSCOPY;  Service: Endoscopy;  Laterality: N/A;   IR  FLUORO GUIDE CV LINE RIGHT  07/29/2019   IR US GUIDE VASC ACCESS RIGHT  07/29/2019   LEFT HEART CATH AND CORONARY ANGIOGRAPHY N/A 12/13/2022   Procedure: LEFT HEART CATH AND CORONARY ANGIOGRAPHY;  Surgeon: Corky Crafts, MD;  Location: [facility] INVASIVE CV LAB;  Service: Cardiovascular;  Laterality: N/A;    Inpatient  Medications: Scheduled Meds:  heparin  4,000 Units Intravenous Once   Continuous Infusions:  heparin     PRN Meds:   Allergies:   No Known Allergies  Social History:   Social History   Socioeconomic History   Marital status: Single    Spouse name: Not on file   Number of children: Not on file   Years of education: Not on file   Highest education level: Not on file  Occupational History   Not on file  Tobacco Use   Smoking status: Never   Smokeless tobacco: Never  Vaping Use   Vaping status: Never Used  Substance and Sexual Activity   Alcohol use: Not Currently   Drug use: Not Currently    Types: Marijuana   Sexual activity: Not on file  Other Topics Concern   Not on file  Social History Narrative   Not on file   Social Determinants of Health   Financial Resource Strain: Not on file  Food Insecurity: No Food Insecurity (04/02/2023)   Hunger Vital Sign    Worried About Running Out of Food in the Last Year: Never true    Ran Out of Food in the Last Year: Never true  Recent Concern: Food Insecurity - Food Insecurity Present (02/13/2023)   Hunger Vital Sign    Worried About Running Out of Food in the Last Year: Sometimes true    Ran Out of Food in the Last Year: Sometimes true  Transportation Needs: Unmet Transportation Needs (04/02/2023)   PRAPARE - Administrator, Civil Service (Medical): Yes    Lack of Transportation (Non-Medical): Yes  Physical Activity: Not on file  Stress: Not on file  Social Connections: Not on file  Intimate Partner Violence: Not At Risk (04/02/2023)   Humiliation, Afraid, Rape, and Kick questionnaire    Fear of Current or Ex-Partner: No    Emotionally Abused: No    Physically Abused: No    Sexually Abused: No    Family History:   Family History  Problem Relation Age of Onset   CAD Mother    Hypertension Mother    Diabetes Neg Hx    Stroke Neg Hx    Cancer Neg Hx    Kidney failure Neg Hx    Stomach cancer Neg Hx     Colon cancer Neg Hx    Rectal cancer Neg Hx      ROS:  Please see the history of present illness.  All other ROS reviewed and negative.     Physical Exam/Data:   Vitals:   05/09/23 1003 05/09/23 1330 05/09/23 1349 05/09/23 1400  BP: (!) 159/92 (!) 174/100 (!) 174/100   Pulse: 68  67   Resp: 17 10 12    Temp:   98 F (36.7 C)   TempSrc:   Oral   SpO2: 97%  97%   Weight:    104 kg  Height:    6\' 1"  (1.854 m)   No intake or output data in the 24 hours ending 05/09/23 1445    05/09/2023    2:00 PM 04/03/2023    5:00 AM 04/02/2023  1:40 PM  Last 3 Weights  Weight (lbs) 229 lb 4.5 oz 212 lb 4.9 oz 211 lb 10.3 oz  Weight (kg) 104 kg 96.3 kg 96 kg     Body mass index is 30.25 kg/m.  General:  Well nourished, well developed, in no acute distress. Eyes closed  HEENT: normal Neck: no JVD Vascular: No carotid bruits; Distal pulses 2+ bilaterally Cardiac:  normal S1, S2; RRR; no murmur  Lungs:  clear to auscultation bilaterally, no wheezing, rhonchi or rales  Abd: soft, nontender, no hepatomegaly  Ext: no edema Musculoskeletal:  No deformities, BUE and BLE strength normal and equal Skin: warm and dry  Neuro:  CNs 2-12 intact, no focal abnormalities noted Psych:  Normal affect   EKG:  The EKG was personally reviewed and demonstrates: Normal sinus rhythm, heart rate 74, repolarization abnormality in V2 and V3.  Lateral T wave inversions.  No significant changes from previous. Telemetry:  Telemetry was personally reviewed and demonstrates: Normal sinus rhythm heart rate in the 70s  Relevant CV Studies: Echocardiogram 04/03/2023  1. Left ventricular ejection fraction, by estimation, is 55 to 60%. The  left ventricle has normal function. The left ventricle demonstrates global  hypokinesis. There is mild concentric left ventricular hypertrophy. Left  ventricular diastolic function  could not be evaluated.   2. Right ventricular systolic function is normal. The right ventricular   size is normal.   3. There is a echodense mobile mass (1.0 cm x 0.49 cm) noted on the  posterior leaflet of the mitral valve. Calcification vs vegetation, may  need TEE for clarification. Clinical correlation will be beneficial. The  mitral valve is degenerative. No  evidence of mitral valve regurgitation. No evidence of mitral stenosis.  Moderate mitral annular calcification.   4. The aortic valve is normal in structure. Aortic valve regurgitation is  not visualized. No aortic stenosis is present.   5. The inferior vena cava is normal in size with greater than 50%  respiratory variability, suggesting right atrial pressure of 3 mmHg.   Left heart catheterization 12/13/2022   Dist LM to Prox LAD lesion is 25% stenosed.  Cross-sectional area of the left main by intravascular ultrasound was 8.1 mm.   Prox LAD lesion is 50% stenosed.  Cross-sectional area of this lesion by intravascular ultrasound was 6.2 mm.   Mid LAD lesion is 25% stenosed.   Ost Cx to Prox Cx lesion is 25% stenosed.   The left ventricular systolic function is normal.   LV end diastolic pressure is mildly elevated.   The left ventricular ejection fraction is 55-65% by visual estimate.   There is no aortic valve stenosis.   In the absence of any other complications or medical issues, we expect the patient to be ready for discharge from a cath perspective on 12/14/2022.   Recommend Aspirin 81mg  daily for moderate CAD.   Severe coronary calcification.  Nonobstructive disease throughout the left main, LAD and circumflex.  Continue medical therapy.  Laboratory Data:  High Sensitivity Troponin:   Recent Labs  Lab 05/09/23 0227 05/09/23 0512 05/09/23 0703 05/09/23 1204  TROPONINIHS 59* 103* 140* 275*     Chemistry Recent Labs  Lab 05/09/23 0227  NA 134*  K 4.1  CL 93*  CO2 26  GLUCOSE 162*  BUN 32*  CREATININE 10.76*  CALCIUM 8.6*  GFRNONAA 5*  ANIONGAP 15    Recent Labs  Lab 05/09/23 0227  PROT 7.4   ALBUMIN 3.2*  AST 22  ALT 14  ALKPHOS 85  BILITOT 0.6   Lipids No results for input(s): "CHOL", "TRIG", "HDL", "LABVLDL", "LDLCALC", "CHOLHDL" in the last 168 hours.  Hematology Recent Labs  Lab 05/09/23 0227  WBC 6.5  RBC 3.63*  HGB 11.3*  HCT 34.7*  MCV 95.6  MCH 31.1  MCHC 32.6  RDW 16.8*  PLT 178   Thyroid No results for input(s): "TSH", "FREET4" in the last 168 hours.  BNPNo results for input(s): "BNP", "PROBNP" in the last 168 hours.  DDimer No results for input(s): "DDIMER" in the last 168 hours.   Radiology/Studies:  DG Chest Portable 1 View  Result Date: 05/09/2023 CLINICAL DATA:  Midsternal chest pain EXAM: PORTABLE CHEST 1 VIEW COMPARISON:  04/02/2023 FINDINGS: Cardiomegaly and congested appearance of vessels. Normal appearance of the aortic contours. Accentuated interstitial lung markings are improved. No effusion or pneumothorax. No osseous finding. Artifact from EKG leads. IMPRESSION: Cardiomegaly and vascular congestion. Electronically Signed   By: Tiburcio Pea M.D.   On: 05/09/2023 04:37     Assessment and Plan:   Chest pain /elevated troponin/demand ischemia Nonobstructive CAD Very high suspicion for chest pain related to GERD.  Reports symptoms only occurring at night, improved when sitting up, not associated with any other symptoms.  EKG generally unchanged does show repolarization abnormality likely due to LVH.  Troponins are rising 59-103-140-275.  Also in the setting of a blood pressure of 174/100 and ESRD.  He had previous catheterization 12/13/2022 that showed severe coronary calcifications diffusely however nonobstructive disease, with recommendations to continue medical therapy. Likely now elevated due to elevated BP/ESRD and not likely truly representative of ACS.  Perhaps some microvascular involvement. Needs better BP control.  Would not continue heparin. Continue carvedilol 25 mg twice daily, atorvastatin 80 mg Previously had issues with imdur  due to headaches however he feels like he would like to try this again.   Heart failure with improved ejection fraction EF as low as 20 to 25% in 2021 however has normalized since 2023.  Does not exhibit any heart failure symptoms.  Echo from October 2024 shows EF 55 to 60% with mild concentric LVH, normal RV function.  Regardless volume status managed by dialysis. Euvolemic.  Continue carvedilol 25 mg  HTN 170/100 uncontrolled.  Continue carvedilol and start amlodipine 5mg   ESRD on HD Per nephrology with volume control.  PD hypertensive today and is due for dialysis today. ESRD in the setting of hypertension has had chronically elevated troponin levels.  I suspect that the troponin elevation is related to demand ischemia from hypertension and ESRD and small vessel disease.  Risk Assessment/Risk Scores:   The patient's TIMI risk score is 4, which indicates a 20% risk of all cause mortality, new or recurrent myocardial infarction or need for urgent revascularization in the next 14 days.{   For questions or updates, please contact [facility] Please consult [email or web address] for contact info under    Signed, Abagail Kitchens, PA-C  05/09/2023 2:45 PM     ATTENDING ATTESTATION  I have seen, examined and evaluated the patient this afternoon along with Abagail Kitchens, PA-C.  After reviewing all the available data and chart, we discussed the patients laboratory, study & physical findings as well as symptoms in detail.  I agree with his findings, examination as well as impression recommendations as per our discussion.    Attending adjustments noted in italics.   Patient with known mild to moderate nonobstructive CAD by cath just 5 months  ago who is ESRD on dialysis presenting with hypertension and chest pain that is more consistent with GERD.  Not worse with exertion.  Worsening lies down at night.  Does not sound consistent with ACS.  He does have troponin elevation which is upward  trending which is probably related to his hypertension and dialysis.  Which suggest this is probably demand ischemia related to small vessel disease and renal disease.  I do agree with stopping IV heparin and treating blood pressures.  Will monitor along.  I do not think we need to do any Type of invasive or noninvasive ischemic evaluation.    Marykay Lex, MD, MS Bryan Lemma, M.D., M.S. Interventional Cardiologist  [facility]  Pager # [phone] Phone # [phone] [address] [place], [place] [address]

## 2023-05-09 NOTE — ED Notes (Signed)
ED TO INPATIENT HANDOFF REPORT  ED Nurse Name and Phone #: Donny Pique, RN [phone]  S Name/Age/Gender Corey Park 52 y.o. male Room/Bed: 016C/016C  Code Status   Code Status: Prior  Home/SNF/Other Home Patient oriented to: self, place, time, and situation Is this baseline? Yes   Triage Complete: Triage complete  Chief Complaint Chest pain [R07.9]  Triage Note Pt is coming in with an aching chest pain starting at 0120 this monring, 10/10 pain described by him. Pain is mid sternal in location, did have 1 episode of emsis with some continued nausea. Pt is alert currentyl and oriented. Medic reports some EKG changes that was sent to the MD. He is also a dialysis patient as well and had his dialysis on Monday.   Medic vitals  144/88 88hr 97%ra 18rr  18g left AC  324mg  asa given 0.4mg  nitroglycerin SL     Allergies No Known Allergies  Level of Care/Admitting Diagnosis ED Disposition     ED Disposition  Admit   Condition  --   Comment  Hospital Area: [facility] [[id number]]  Level of Care: Telemetry Cardiac [103]  May place patient in observation at [facility] or [facility] if equivalent level of care is available:: No  Covid Evaluation: Asymptomatic - no recent exposure (last 10 days) testing not required  Diagnosis: Chest pain [099833]  Admitting Physician: Erenest Blank  Attending Physician: Erenest Blank          B Medical/Surgery History Past Medical History:  Diagnosis Date   Allergy, unspecified, initial encounter 08/25/2019   Asthma    as a child   Cellulitis, perineum 11/26/2019   CHF (congestive heart failure) (HCC) 2021   Complication of vascular dialysis catheter 08/25/2019   COVID-19 virus infection 07/27/2019   Diabetes mellitus without complication (HCC)    type 2   Dilated cardiomyopathy (HCC) 07/03/2018   ESRD on hemodialysis (HCC) 06/17/2018   MWF at [facility]   Fe deficiency anemia 12/23/2018    Hypertension    Legally blind    B/L   Pneumonia 2021   Type 2 diabetes mellitus with diabetic peripheral angiopathy without gangrene (HCC) 08/25/2019   Unspecified protein-calorie malnutrition (HCC) 08/25/2019   Past Surgical History:  Procedure Laterality Date   BASCILIC VEIN TRANSPOSITION Right 10/16/2019   Procedure: Basilic Vein Transposition Right Arm;  Surgeon: Nada Libman, MD;  Location: [facility] OR;  Service: Vascular;  Laterality: Right;   BASCILIC VEIN TRANSPOSITION Right 01/29/2020   Procedure: RIGHT ARM SECOND STAGE BASCILIC VEIN TRANSPOSITION;  Surgeon: Nada Libman, MD;  Location: [facility] OR;  Service: Vascular;  Laterality: Right;   BIOPSY  12/22/2018   Procedure: BIOPSY;  Surgeon: Carman Ching, MD;  Location: [facility] ENDOSCOPY;  Service: Endoscopy;;   CORONARY ULTRASOUND/IVUS N/A 12/13/2022   Procedure: Coronary Ultrasound/IVUS;  Surgeon: Corky Crafts, MD;  Location: [facility] INVASIVE CV LAB;  Service: Cardiovascular;  Laterality: N/A;   ESOPHAGOGASTRODUODENOSCOPY N/A 12/22/2018   Procedure: ESOPHAGOGASTRODUODENOSCOPY (EGD);  Surgeon: Carman Ching, MD;  Location: [facility] ENDOSCOPY;  Service: Endoscopy;  Laterality: N/A;   IR FLUORO GUIDE CV LINE RIGHT  07/29/2019   IR US GUIDE VASC ACCESS RIGHT  07/29/2019   LEFT HEART CATH AND CORONARY ANGIOGRAPHY N/A 12/13/2022   Procedure: LEFT HEART CATH AND CORONARY ANGIOGRAPHY;  Surgeon: Corky Crafts, MD;  Location: [facility] INVASIVE CV LAB;  Service: Cardiovascular;  Laterality: N/A;     A IV Location/Drains/Wounds Patient Lines/Drains/Airways Status  Active Line/Drains/Airways     Name Placement date Placement time Site Days   Peripheral IV 05/09/23 20 G Left;Posterior Forearm 05/09/23  0227  Forearm  less than 1   Fistula / Graft Right Upper arm Arteriovenous fistula 10/16/19  0833  Upper arm  1301   Wound / Incision (Open or Dehisced) 10/19/22 Laceration Lip Right 10/19/22  2100  Lip  202            Intake/Output Last 24  hours No intake or output data in the 24 hours ending 05/09/23 1616  Labs/Imaging Results for orders placed or performed during the hospital encounter of 05/09/23 (from the past 48 hour(s))  CBC with Differential     Status: Abnormal   Collection Time: 05/09/23  2:27 AM  Result Value Ref Range   WBC 6.5 4.0 - 10.5 K/uL   RBC 3.63 (L) 4.22 - 5.81 MIL/uL   Hemoglobin 11.3 (L) 13.0 - 17.0 g/dL   HCT 40.9 (L) 81.1 - 91.4 %   MCV 95.6 80.0 - 100.0 fL   MCH 31.1 26.0 - 34.0 pg   MCHC 32.6 30.0 - 36.0 g/dL   RDW 78.2 (H) 95.6 - 21.3 %   Platelets 178 150 - 400 K/uL   nRBC 0.0 0.0 - 0.2 %   Neutrophils Relative % 61 %   Neutro Abs 4.0 1.7 - 7.7 K/uL   Lymphocytes Relative 24 %   Lymphs Abs 1.5 0.7 - 4.0 K/uL   Monocytes Relative 11 %   Monocytes Absolute 0.7 0.1 - 1.0 K/uL   Eosinophils Relative 4 %   Eosinophils Absolute 0.2 0.0 - 0.5 K/uL   Basophils Relative 0 %   Basophils Absolute 0.0 0.0 - 0.1 K/uL   Immature Granulocytes 0 %   Abs Immature Granulocytes 0.01 0.00 - 0.07 K/uL    Comment: Performed at [facility], [address], [place], [place] [address]  Comprehensive metabolic panel     Status: Abnormal   Collection Time: 05/09/23  2:27 AM  Result Value Ref Range   Sodium 134 (L) 135 - 145 mmol/L   Potassium 4.1 3.5 - 5.1 mmol/L   Chloride 93 (L) 98 - 111 mmol/L   CO2 26 22 - 32 mmol/L   Glucose, Bld 162 (H) 70 - 99 mg/dL    Comment: Glucose reference range applies only to samples taken after fasting for at least 8 hours.   BUN 32 (H) 6 - 20 mg/dL   Creatinine, Ser 84.69 (H) 0.61 - 1.24 mg/dL   Calcium 8.6 (L) 8.9 - 10.3 mg/dL   Total Protein 7.4 6.5 - 8.1 g/dL   Albumin 3.2 (L) 3.5 - 5.0 g/dL   AST 22 15 - 41 U/L   ALT 14 0 - 44 U/L   Alkaline Phosphatase 85 38 - 126 U/L   Total Bilirubin 0.6 <1.2 mg/dL   GFR, Estimated 5 (L) >60 mL/min    Comment: (NOTE) Calculated using the CKD-EPI Creatinine Equation (2021)    Anion gap 15 5 - 15    Comment: Performed  at [facility], [address] [place], [place] [address]  Lipase, blood     Status: Abnormal   Collection Time: 05/09/23  2:27 AM  Result Value Ref Range   Lipase 59 (H) 11 - 51 U/L    Comment: Performed at [facility], [address], [place], [place] [address]  Troponin I (High Sensitivity)     Status:  Abnormal   Collection Time: 05/09/23  2:27 AM  Result Value Ref Range   Troponin I (High Sensitivity) 59 (H) <18 ng/L    Comment: (NOTE) Elevated high sensitivity troponin I (hsTnI) values and significant  changes across serial measurements may suggest ACS but many other  chronic and acute conditions are known to elevate hsTnI results.  Refer to the "Links" section for chest pain algorithms and additional  guidance. Performed at [facility], [address], [place], [place] [address]   Troponin I (High Sensitivity)     Status: Abnormal   Collection Time: 05/09/23  5:12 AM  Result Value Ref Range   Troponin I (High Sensitivity) 103 (HH) <18 ng/L    Comment: CRITICAL RESULT CALLED TO, READ BACK BY AND VERIFIED WITH TERRY, R. RN @ [phone] 05/09/23 JBUTLER (NOTE) Elevated high sensitivity troponin I (hsTnI) values and significant  changes across serial measurements may suggest ACS but many other  chronic and acute conditions are known to elevate hsTnI results.  Refer to the "Links" section for chest pain algorithms and additional  guidance. Performed at [facility], [address] [place], [place] [address]   Troponin I (High Sensitivity)     Status: Abnormal   Collection Time: 05/09/23  7:03 AM  Result Value Ref Range   Troponin I (High Sensitivity) 140 (HH) <18 ng/L    Comment: CRITICAL VALUE NOTED. VALUE IS CONSISTENT WITH PREVIOUSLY REPORTED/CALLED VALUE (NOTE) Elevated high sensitivity troponin I (hsTnI) values and significant  changes across serial measurements may suggest ACS but many other  chronic and acute conditions are known to elevate  hsTnI results.  Refer to the "Links" section for chest pain algorithms and additional  guidance. Performed at [facility], [address] [place], [place] [address]   Troponin I (High Sensitivity)     Status: Abnormal   Collection Time: 05/09/23 12:04 PM  Result Value Ref Range   Troponin I (High Sensitivity) 275 (HH) <18 ng/L    Comment: CRITICAL VALUE NOTED. VALUE IS CONSISTENT WITH PREVIOUSLY REPORTED/CALLED VALUE DELTA CHECK NOTED (NOTE) Elevated high sensitivity troponin I (hsTnI) values and significant  changes across serial measurements may suggest ACS but many other  chronic and acute conditions are known to elevate hsTnI results.  Refer to the "Links" section for chest pain algorithms and additional  guidance. Performed at [facility], [address], [place], [place] [address]    DG Chest Portable 1 View  Result Date: 05/09/2023 CLINICAL DATA:  Midsternal chest pain EXAM: PORTABLE CHEST 1 VIEW COMPARISON:  04/02/2023 FINDINGS: Cardiomegaly and congested appearance of vessels. Normal appearance of the aortic contours. Accentuated interstitial lung markings are improved. No effusion or pneumothorax. No osseous finding. Artifact from EKG leads. IMPRESSION: Cardiomegaly and vascular congestion. Electronically Signed   By: Tiburcio Pea M.D.   On: 05/09/2023 04:37    Pending Labs Unresulted Labs (From admission, onward)     Start     Ordered   05/11/23 0500  Heparin level (unfractionated)  Daily,   R      05/09/23 1428   05/10/23 0500  CBC  Daily,   R      05/09/23 1428   05/09/23 2200  Heparin level (unfractionated)  Once-Timed,   URGENT        05/09/23 1428            Vitals/Pain Today's Vitals   05/09/23 1003 05/09/23 1330 05/09/23 1349 05/09/23 1400  BP: (!) 159/92 (!) 174/100 (!) 174/100   Pulse: 68  67   Resp: 17 10 12    Temp:   98 F (36.7 C)   TempSrc:   Oral   SpO2: 97%  97%   Weight:    104 kg  Height:    6\' 1"  (1.854 m)  PainSc:         Isolation Precautions No active isolations  Medications Medications  amLODipine (NORVASC) tablet 5 mg (has no administration in time range)  alum & mag hydroxide-simeth (MAALOX/MYLANTA) 200-200-20 MG/5ML suspension 30 mL (30 mLs Oral Given 05/09/23 0232)    And  lidocaine (XYLOCAINE) 2 % viscous mouth solution 15 mL (15 mLs Oral Given 05/09/23 0232)  pantoprazole (PROTONIX) injection 40 mg (40 mg Intravenous Given 05/09/23 0232)  aspirin chewable tablet 324 mg (324 mg Oral Given 05/09/23 0903)  heparin bolus via infusion 4,000 Units (4,000 Units Intravenous Bolus from Bag 05/09/23 1447)    Mobility walks     Focused Assessments    R Recommendations: See Admitting Provider Note  Report given to:   Additional Notes: Patient is A&Ox4, cards signed of on DC the heparin and patient is still to be admitted.

## 2023-05-09 NOTE — ED Notes (Signed)
Pt desat while asleep. 2L O2 applied.

## 2023-05-09 NOTE — H&P (View-Only) (Signed)
Mobile mass:  On 04/03/2023 an echodense mobile mass 1 x 0.49 cm was noted on the posterior leaflet of the mitral valve concerning for vegetation versus calcification.  Does not appear that cardiology was involved with this finding nor are there any other comments pertaining to this.  Given concerning findings now we will plan for TEE to further evaluate this.  Blood cultures will also be ordered.    [facility] has been requested to perform a transesophageal echocardiogram on [facility] for echodense mass.  After careful review of history and examination, the risks and benefits of transesophageal echocardiogram have been explained including risks of esophageal damage, perforation (1:10,000 risk), bleeding, pharyngeal hematoma as well as other potential complications associated with conscious sedation including aspiration, arrhythmia, respiratory failure and death. Alternatives to treatment were discussed, questions were answered. Patient is willing to proceed.   Abagail Kitchens, PA-C  05/09/2023 6:15 PM

## 2023-05-09 NOTE — Progress Notes (Signed)
Mobile mass:  On 04/03/2023 an echodense mobile mass 1 x 0.49 cm was noted on the posterior leaflet of the mitral valve concerning for vegetation versus calcification.  Does not appear that cardiology was involved with this finding nor are there any other comments pertaining to this.  Given concerning findings now we will plan for TEE to further evaluate this.  Blood cultures will also be ordered.    [facility] has been requested to perform a transesophageal echocardiogram on [facility] for echodense mass.  After careful review of history and examination, the risks and benefits of transesophageal echocardiogram have been explained including risks of esophageal damage, perforation (1:10,000 risk), bleeding, pharyngeal hematoma as well as other potential complications associated with conscious sedation including aspiration, arrhythmia, respiratory failure and death. Alternatives to treatment were discussed, questions were answered. Patient is willing to proceed.   Abagail Kitchens, PA-C  05/09/2023 6:15 PM

## 2023-05-09 NOTE — ED Provider Notes (Signed)
Care of patient received from prior provider at 7:04 AM, please see their note for complete H/P and care plan.  Received handoff per ED course.  Clinical Course as of 05/09/23 1534  Wed May 09, 2023  0981 Stable  52 YOM ESRD  Atypical chest pain. Baseline troponin is 50-100. First is 50, 2nd is 100.  3rd troponin. SMDM with patient.   [CC]  [id number] Cardiology consulted [CC]    Clinical Course User Index [CC] Glyn Ade, MD    Reassessment: Pending CP Evaluation by cardiology. Given rising troponin, started on aspirin and aspirin load pending cardiology recommendations.   Glyn Ade, MD 05/09/23 1544

## 2023-05-09 NOTE — ED Triage Notes (Signed)
Pt is coming in with an aching chest pain starting at 0120 this monring, 10/10 pain described by him. Pain is mid sternal in location, did have 1 episode of emsis with some continued nausea. Pt is alert currentyl and oriented. Medic reports some EKG changes that was sent to the MD. He is also a dialysis patient as well and had his dialysis on Monday.   Medic vitals  144/88 88hr 97%ra 18rr  18g left AC  324mg  asa given 0.4mg  nitroglycerin SL

## 2023-05-09 NOTE — H&P (Addendum)
History and Physical    Name Corey Park DOB: 06/01/1971 DOA: 05/09/2023  PCP: [name], NP  Patient coming from: home   Chief Complaint: chest pain  HPI: Corey Park is a 52 y.o. male with medical history significant for esrd on hemodialysis, t2dm, hfrecoveredef, htn, presenting with the above.  Substernal chest pain began last night, did not radiate, some sob when lying flat, nothing exertional. Pain has come and gone, last had it this morning so has been several hours since he had pain. No cough or fever. Tolerating diet, no abd pain. Missed dialysis today, had on Monday. No n/v/d.   Review of Systems: As per HPI otherwise 10 point review of systems negative.    Past Medical History:  Diagnosis Date   Allergy, unspecified, initial encounter 08/25/2019   Asthma    as a child   Cellulitis, perineum 11/26/2019   CHF (congestive heart failure) (HCC) 2021   Complication of vascular dialysis catheter 08/25/2019   COVID-19 virus infection 07/27/2019   Diabetes mellitus without complication (HCC)    type 2   Dilated cardiomyopathy (HCC) 07/03/2018   ESRD on hemodialysis (HCC) 06/17/2018   MWF at [facility]   Fe deficiency anemia 12/23/2018   Hypertension    Legally blind    B/L   Pneumonia 2021   Type 2 diabetes mellitus with diabetic peripheral angiopathy without gangrene (HCC) 08/25/2019   Unspecified protein-calorie malnutrition (HCC) 08/25/2019    Past Surgical History:  Procedure Laterality Date   BASCILIC VEIN TRANSPOSITION Right 10/16/2019   Procedure: Basilic Vein Transposition Right Arm;  Surgeon: Nada Libman, MD;  Location: [facility] OR;  Service: Vascular;  Laterality: Right;   BASCILIC VEIN TRANSPOSITION Right 01/29/2020   Procedure: RIGHT ARM SECOND STAGE BASCILIC VEIN TRANSPOSITION;  Surgeon: Nada Libman, MD;  Location: [facility] OR;  Service: Vascular;  Laterality: Right;   BIOPSY  12/22/2018   Procedure: BIOPSY;  Surgeon: Carman Ching, MD;   Location: [facility] ENDOSCOPY;  Service: Endoscopy;;   CORONARY ULTRASOUND/IVUS N/A 12/13/2022   Procedure: Coronary Ultrasound/IVUS;  Surgeon: Corky Crafts, MD;  Location: [facility] INVASIVE CV LAB;  Service: Cardiovascular;  Laterality: N/A;   ESOPHAGOGASTRODUODENOSCOPY N/A 12/22/2018   Procedure: ESOPHAGOGASTRODUODENOSCOPY (EGD);  Surgeon: Carman Ching, MD;  Location: [facility] ENDOSCOPY;  Service: Endoscopy;  Laterality: N/A;   IR FLUORO GUIDE CV LINE RIGHT  07/29/2019   IR US GUIDE VASC ACCESS RIGHT  07/29/2019   LEFT HEART CATH AND CORONARY ANGIOGRAPHY N/A 12/13/2022   Procedure: LEFT HEART CATH AND CORONARY ANGIOGRAPHY;  Surgeon: Corky Crafts, MD;  Location: [facility] INVASIVE CV LAB;  Service: Cardiovascular;  Laterality: N/A;     reports that he has never smoked. He has never used smokeless tobacco. He reports that he does not currently use alcohol. He reports that he does not currently use drugs after having used the following drugs: Marijuana.  No Known Allergies  Family History  Problem Relation Age of Onset   CAD Mother    Hypertension Mother    Diabetes Neg Hx    Stroke Neg Hx    Cancer Neg Hx    Kidney failure Neg Hx    Stomach cancer Neg Hx    Colon cancer Neg Hx    Rectal cancer Neg Hx     Prior to Admission medications   Medication Sig Start Date End Date Taking? Authorizing Provider  atorvastatin (LIPITOR) 80 MG tablet Take 1 tablet (80 mg total) by mouth daily. 12/15/22  Yes  Modena Slater, DO  calcium acetate (PHOSLO) 667 MG capsule Take 2 capsules (1,334 mg total) by mouth with breakfast, with lunch, and with evening meal. Patient taking differently: Take 2,001 mg by mouth with breakfast, with lunch, and with evening meal. 12/14/22  Yes Modena Slater, DO  carvedilol (COREG) 25 MG tablet Take 1 tablet (25 mg total) by mouth 2 (two) times daily with a meal. 12/14/22  Yes Modena Slater, DO  famotidine (PEPCID) 20 MG tablet Take 20 mg by mouth daily. 04/20/23  Yes [provider]   ferric citrate (AURYXIA) 1 GM 210 MG(Fe) tablet Take 2 tablets (420 mg total) by mouth daily. 12/14/22  Yes Modena Slater, DO  gabapentin (NEURONTIN) 100 MG capsule Take 1 capsule (100 mg total) by mouth 2 (two) times daily. 04/03/23  Yes Dorcas Carrow, MD  Methoxy PEG-Epoetin Beta (MIRCERA IJ) Inject 1 Syringe into the skin every 14 (fourteen) days. 03/21/23 03/19/24 Yes [provider]  multivitamin (RENA-VIT) TABS tablet Take 1 tablet by mouth daily. 11/22/22  Yes [provider]  olmesartan (BENICAR) 20 MG tablet Take 1 tablet (20 mg total) by mouth daily. 12/14/22  Yes Patel, Amar, DO  pantoprazole (PROTONIX) 20 MG tablet Take 20 mg by mouth daily. 04/19/23  Yes [provider]  pantoprazole (PROTONIX) 40 MG tablet Take 40 mg by mouth daily as needed. 04/18/23  Yes [provider]  nitroGLYCERIN (NITROSTAT) 0.4 MG SL tablet Place 0.4 mg under the tongue every 5 (five) minutes as needed for chest pain. Patient not taking: Reported on 05/09/2023 12/19/22   [provider]  ondansetron (ZOFRAN-ODT) 4 MG disintegrating tablet Take 4 mg by mouth every 8 (eight) hours as needed for nausea or vomiting. Patient not taking: Reported on 05/09/2023 04/20/23   [provider]    Physical Exam: Vitals:   05/09/23 1003 05/09/23 1330 05/09/23 1349 05/09/23 1400  BP: (!) 159/92 (!) 174/100 (!) 174/100   Pulse: 68  67   Resp: 17 10 12    Temp:   98 F (36.7 C)   TempSrc:   Oral   SpO2: 97%  97%   Weight:    104 kg  Height:    6\' 1"  (1.854 m)    Constitutional: No acute distress Head: Atraumatic Eyes: Conjunctiva clear ENM: Moist mucous membranes. Normal dentition.  Neck: Supple Respiratory: Clear to auscultation bilaterally, no wheezing/rales/rhonchi. Normal respiratory effort. No accessory muscle use. . Cardiovascular: Regular rate and rhythm. Faint systolic murmur Abdomen: Non-tender, obese. No masses. No rebound or guarding. P  Musculoskeletal:  No joint deformity upper and lower extremities.   Skin: No rashes, lesions, or ulcers.  Extremities: trace LE peripheral edema. Palpable peripheral pulses. Neurologic: Alert, moving all 4 extremities. Psychiatric: Normal insight and judgement.   Labs on Admission: I have personally reviewed following labs and imaging studies  CBC: Recent Labs  Lab 05/09/23 0227  WBC 6.5  NEUTROABS 4.0  HGB 11.3*  HCT 34.7*  MCV 95.6  PLT 178   Basic Metabolic Panel: Recent Labs  Lab 05/09/23 0227  NA 134*  K 4.1  CL 93*  CO2 26  GLUCOSE 162*  BUN 32*  CREATININE 10.76*  CALCIUM 8.6*   GFR: Estimated Creatinine Clearance: 10.2 mL/min (A) (by C-G formula based on SCr of 10.76 mg/dL (H)). Liver Function Tests: Recent Labs  Lab 05/09/23 0227  AST 22  ALT 14  ALKPHOS 85  BILITOT 0.6  PROT 7.4  ALBUMIN 3.2*   Recent Labs  Lab 05/09/23 0227  LIPASE 59*   No results for input(s): "AMMONIA" in the last 168 hours. Coagulation Profile: No results for input(s): "INR", "PROTIME" in the last 168 hours. Cardiac Enzymes: No results for input(s): "CKTOTAL", "CKMB", "CKMBINDEX", "TROPONINI" in the last 168 hours. BNP (last 3 results) No results for input(s): "PROBNP" in the last 8760 hours. HbA1C: No results for input(s): "HGBA1C" in the last 72 hours. CBG: No results for input(s): "GLUCAP" in the last 168 hours. Lipid Profile: No results for input(s): "CHOL", "HDL", "LDLCALC", "TRIG", "CHOLHDL", "LDLDIRECT" in the last 72 hours. Thyroid Function Tests: No results for input(s): "TSH", "T4TOTAL", "FREET4", "T3FREE", "THYROIDAB" in the last 72 hours. Anemia Panel: No results for input(s): "VITAMINB12", "FOLATE", "FERRITIN", "TIBC", "IRON", "RETICCTPCT" in the last 72 hours. Urine analysis:    Component Value Date/Time   COLORURINE YELLOW 04/17/2022 1916   APPEARANCEUR CLEAR 04/17/2022 1916   LABSPEC 1.015 04/17/2022 1916   PHURINE 7.0 04/17/2022 1916   GLUCOSEU 50 (A) 04/17/2022  1916   HGBUR SMALL (A) 04/17/2022 1916   BILIRUBINUR NEGATIVE 04/17/2022 1916   KETONESUR NEGATIVE 04/17/2022 1916   PROTEINUR 100 (A) 04/17/2022 1916   NITRITE NEGATIVE 04/17/2022 1916   LEUKOCYTESUR TRACE (A) 04/17/2022 1916    Radiological Exams on Admission: DG Chest Portable 1 View  Result Date: 05/09/2023 CLINICAL DATA:  Midsternal chest pain EXAM: PORTABLE CHEST 1 VIEW COMPARISON:  04/02/2023 FINDINGS: Cardiomegaly and congested appearance of vessels. Normal appearance of the aortic contours. Accentuated interstitial lung markings are improved. No effusion or pneumothorax. No osseous finding. Artifact from EKG leads. IMPRESSION: Cardiomegaly and vascular congestion. Electronically Signed   By: Tiburcio Pea M.D.   On: 05/09/2023 04:37    EKG: Independently reviewed. 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 Here with chest pain that has resolved. Troponins have risen from 103 to 275. No overt ischemic changes on EKG. Cardiology has seen, thinks this may be gerd though angina is also in the ddx as is demand from hypertension and missed dialysis. Cardiology advises holding on more heparin, admit for serial troponins and observation - defer further imaging and other diagnostics to cardiology - serial troponins - statin - tele  # ESRD On mwf hemodialysis. 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    Silvano Bilis MD [facility] Pager [phone]  If 7PM-7AM, please contact night-coverage [email or web address] Password [facility]  05/09/2023, 4:13 PM

## 2023-05-09 NOTE — ED Provider Notes (Signed)
Received patient in turnover from Dr. Doran Durand.  Please see their note for further details of Hx, PE.  Briefly patient is a 52 y.o. male with a Chest Pain .  Patient in the ED since last night.  Escalating trop, awaiting cards eval.  Discussed with cards.  They feel like this is noncardiac.  Their troponin has gone from 50-275.  He is currently on a heparin infusion.  Will discuss with medicine for admission.    Melene Plan, DO 05/09/23 1712

## 2023-05-09 NOTE — Progress Notes (Signed)
PHARMACY - ANTICOAGULATION CONSULT NOTE  Pharmacy Consult for heparin infusion Indication: chest pain/ACS  No Known Allergies  Patient Measurements: Height: 6\' 1"  (185.4 cm) Weight: 104 kg (229 lb 4.5 oz) (estimated from 11/11 dialysis) IBW/kg (Calculated) : 79.9 Heparin Dosing Weight: 101 kg  Vital Signs: Temp: 98 F (36.7 C) (11/13 1349) Temp Source: Oral (11/13 1349) BP: 174/100 (11/13 1349) Pulse Rate: 67 (11/13 1349)  Labs: Recent Labs    05/09/23 0227 05/09/23 0512 05/09/23 0703 05/09/23 1204  HGB 11.3*  --   --   --   HCT 34.7*  --   --   --   PLT 178  --   --   --   CREATININE 10.76*  --   --   --   TROPONINIHS 59* 103* 140* 275*    Estimated Creatinine Clearance: 10.2 mL/min (A) (by C-G formula based on SCr of 10.76 mg/dL (H)).   Medical History: Past Medical History:  Diagnosis Date   Allergy, unspecified, initial encounter 08/25/2019   Asthma    as a child   Cellulitis, perineum 11/26/2019   CHF (congestive heart failure) (HCC) 2021   Complication of vascular dialysis catheter 08/25/2019   COVID-19 virus infection 07/27/2019   Diabetes mellitus without complication (HCC)    type 2   Dilated cardiomyopathy (HCC) 07/03/2018   ESRD on hemodialysis (HCC) 06/17/2018   MWF at [facility]   Fe deficiency anemia 12/23/2018   Hypertension    Legally blind    B/L   Pneumonia 2021   Type 2 diabetes mellitus with diabetic peripheral angiopathy without gangrene (HCC) 08/25/2019   Unspecified protein-calorie malnutrition (HCC) 08/25/2019    Assessment: 52yoM presenting for chest pain, history of HD (MWF). hsTnI elevated at 275 from 50-100 initially, CBC appears within normal limits. No anticoagulation prior to admission noted, pharmacy consulted to initiate heparin.  Goal of Therapy:  Heparin level 0.3-0.7 units/ml Monitor platelets by anticoagulation protocol: Yes   Plan:  Give 4000 units bolus x 1 Start heparin infusion at 1300 units/hr Check anti-Xa  level in 8 hours and daily while on heparin Continue to monitor H&H and platelets  Rutherford Nail, PharmD PGY2 Critical Care Pharmacy Resident 05/09/2023,2:13 PM

## 2023-05-10 ENCOUNTER — Observation Stay (HOSPITAL_COMMUNITY): Payer: Medicare Other | Admitting: Anesthesiology

## 2023-05-10 ENCOUNTER — Encounter (HOSPITAL_COMMUNITY)
Admission: EM | Disposition: A | Discharge status: Home / Self Care | Payer: Self-pay | Source: Home / Self Care | Attending: Internal Medicine

## 2023-05-10 ENCOUNTER — Observation Stay (HOSPITAL_COMMUNITY): Payer: Medicare Other

## 2023-05-10 ENCOUNTER — Encounter (HOSPITAL_COMMUNITY): Payer: Self-pay | Admitting: Cardiovascular Disease

## 2023-05-10 DIAGNOSIS — N186 End stage renal disease: Secondary | ICD-10-CM | POA: Diagnosis present

## 2023-05-10 DIAGNOSIS — I4510 Unspecified right bundle-branch block: Secondary | ICD-10-CM | POA: Diagnosis present

## 2023-05-10 DIAGNOSIS — Z992 Dependence on renal dialysis: Secondary | ICD-10-CM | POA: Diagnosis not present

## 2023-05-10 DIAGNOSIS — Z6830 Body mass index (BMI) 30.0-30.9, adult: Secondary | ICD-10-CM | POA: Diagnosis not present

## 2023-05-10 DIAGNOSIS — Z5982 Transportation insecurity: Secondary | ICD-10-CM | POA: Diagnosis not present

## 2023-05-10 DIAGNOSIS — I132 Hypertensive heart and chronic kidney disease with heart failure and with stage 5 chronic kidney disease, or end stage renal disease: Secondary | ICD-10-CM | POA: Diagnosis present

## 2023-05-10 DIAGNOSIS — Z8249 Family history of ischemic heart disease and other diseases of the circulatory system: Secondary | ICD-10-CM | POA: Diagnosis not present

## 2023-05-10 DIAGNOSIS — Z79899 Other long term (current) drug therapy: Secondary | ICD-10-CM | POA: Diagnosis not present

## 2023-05-10 DIAGNOSIS — E1122 Type 2 diabetes mellitus with diabetic chronic kidney disease: Secondary | ICD-10-CM | POA: Diagnosis present

## 2023-05-10 DIAGNOSIS — E669 Obesity, unspecified: Secondary | ICD-10-CM | POA: Diagnosis present

## 2023-05-10 DIAGNOSIS — I340 Nonrheumatic mitral (valve) insufficiency: Secondary | ICD-10-CM

## 2023-05-10 DIAGNOSIS — I2510 Atherosclerotic heart disease of native coronary artery without angina pectoris: Secondary | ICD-10-CM | POA: Diagnosis present

## 2023-05-10 DIAGNOSIS — R7881 Bacteremia: Secondary | ICD-10-CM | POA: Diagnosis not present

## 2023-05-10 DIAGNOSIS — H548 Legal blindness, as defined in USA: Secondary | ICD-10-CM | POA: Diagnosis present

## 2023-05-10 DIAGNOSIS — I2489 Other forms of acute ischemic heart disease: Secondary | ICD-10-CM | POA: Diagnosis present

## 2023-05-10 DIAGNOSIS — Z91158 Patient's noncompliance with renal dialysis for other reason: Secondary | ICD-10-CM | POA: Diagnosis not present

## 2023-05-10 DIAGNOSIS — E1151 Type 2 diabetes mellitus with diabetic peripheral angiopathy without gangrene: Secondary | ICD-10-CM | POA: Diagnosis present

## 2023-05-10 DIAGNOSIS — I420 Dilated cardiomyopathy: Secondary | ICD-10-CM | POA: Diagnosis present

## 2023-05-10 DIAGNOSIS — I058 Other rheumatic mitral valve diseases: Secondary | ICD-10-CM

## 2023-05-10 DIAGNOSIS — Z8701 Personal history of pneumonia (recurrent): Secondary | ICD-10-CM | POA: Diagnosis not present

## 2023-05-10 DIAGNOSIS — K219 Gastro-esophageal reflux disease without esophagitis: Secondary | ICD-10-CM | POA: Diagnosis present

## 2023-05-10 DIAGNOSIS — I5032 Chronic diastolic (congestive) heart failure: Secondary | ICD-10-CM | POA: Diagnosis present

## 2023-05-10 DIAGNOSIS — R079 Chest pain, unspecified: Secondary | ICD-10-CM

## 2023-05-10 DIAGNOSIS — Z8616 Personal history of COVID-19: Secondary | ICD-10-CM | POA: Diagnosis not present

## 2023-05-10 DIAGNOSIS — D631 Anemia in chronic kidney disease: Secondary | ICD-10-CM | POA: Diagnosis present

## 2023-05-10 DIAGNOSIS — M898X9 Other specified disorders of bone, unspecified site: Secondary | ICD-10-CM | POA: Diagnosis present

## 2023-05-10 HISTORY — PX: TRANSESOPHAGEAL ECHOCARDIOGRAM (CATH LAB): EP1270

## 2023-05-10 LAB — BASIC METABOLIC PANEL
Anion gap: 15 (ref 5–15)
BUN: 51 mg/dL — ABNORMAL HIGH (ref 6–20)
CO2: 25 mmol/L (ref 22–32)
Calcium: 8.7 mg/dL — ABNORMAL LOW (ref 8.9–10.3)
Chloride: 97 mmol/L — ABNORMAL LOW (ref 98–111)
Creatinine, Ser: 14.44 mg/dL — ABNORMAL HIGH (ref 0.61–1.24)
GFR, Estimated: 4 mL/min — ABNORMAL LOW (ref >60)
Glucose, Bld: 139 mg/dL — ABNORMAL HIGH (ref 70–99)
Potassium: 4.8 mmol/L (ref 3.5–5.1)
Sodium: 137 mmol/L (ref 135–145)

## 2023-05-10 LAB — TROPONIN I (HIGH SENSITIVITY)
Troponin I (High Sensitivity): 103 ng/L (ref <18)
Troponin I (High Sensitivity): 110 ng/L (ref <18)

## 2023-05-10 LAB — ECHO TEE

## 2023-05-10 SURGERY — TRANSESOPHAGEAL ECHOCARDIOGRAM (TEE) (CATHLAB)
Anesthesia: Monitor Anesthesia Care

## 2023-05-10 MED ORDER — PROPOFOL 500 MG/50ML IV EMUL
INTRAVENOUS | Status: DC | PRN
  Administered 2023-05-10: 125 ug/kg/min via INTRAVENOUS

## 2023-05-10 MED ORDER — LIDOCAINE 2% (20 MG/ML) 5 ML SYRINGE
INTRAMUSCULAR | Status: DC | PRN
  Administered 2023-05-10: 100 mg via INTRAVENOUS

## 2023-05-10 MED ORDER — PROPOFOL 10 MG/ML IV BOLUS
INTRAVENOUS | Status: DC | PRN
  Administered 2023-05-10: 50 mg via INTRAVENOUS
  Administered 2023-05-10: 30 mg via INTRAVENOUS

## 2023-05-10 MED ORDER — GLYCOPYRROLATE 0.2 MG/ML IJ SOLN
INTRAMUSCULAR | Status: DC | PRN
  Administered 2023-05-10: .1 mg via INTRAVENOUS

## 2023-05-10 MED ORDER — CHLORHEXIDINE GLUCONATE CLOTH 2 % EX PADS
6.0000 | MEDICATED_PAD | Freq: Every day | CUTANEOUS | Status: DC
  Administered 2023-05-11: 6 via TOPICAL

## 2023-05-10 NOTE — Progress Notes (Signed)
Date and time results received: 05/10/23 at 1750  (use smartphrase ".now" to insert current time)  Test: Troponin Critical Value: 103  Name of Provider Notified: Dr. Flora Lipps  Orders Received? Or Actions Taken?:  Awaiting

## 2023-05-10 NOTE — CV Procedure (Signed)
    TRANSESOPHAGEAL ECHOCARDIOGRAM   NAME:  Corey Park    MRN: [id number] DOB:  [birth year]    ADMIT DATE: 05/09/2023  INDICATIONS: MV mass  PROCEDURE:   Informed consent was obtained prior to the procedure. The risks, benefits and alternatives for the procedure were discussed and the patient comprehended these risks.  Risks include, but are not limited to, cough, sore throat, vomiting, nausea, somnolence, esophageal and stomach trauma or perforation, bleeding, low blood pressure, aspiration, pneumonia, infection, trauma to the teeth and death.    Procedural time out performed. The oropharynx was anesthetized with topical 1% benzocaine.    Anesthesia was administered by Dr. Jean Rosenthal.  The patient was administered 240 mg of propofol and 100 mg of lidocaine to achieve and maintain moderate conscious sedation.  The patient's heart rate, blood pressure, and oxygen saturation are monitored continuously during the procedure. The period of conscious sedation is 11 minutes, of which I was present face-to-face 100% of this time.   The transesophageal probe was inserted in the esophagus and stomach without difficulty and multiple views were obtained.   COMPLICATIONS:    There were no immediate complications.  KEY FINDINGS:  Mitral annular calcium, no vegetation.  Normal LVEF, 55% by 3D. Negative bubble study.  Full report to follow. Further management per primary team.   Gerri Spore T. Flora Lipps, MD, [facility]  [facility]  [address], [address] [place], [place] [address] [phone]  12:22 PM

## 2023-05-10 NOTE — Hospital Course (Signed)
52 y.o. male with medical history significant for esrd on hemodialysis, t2dm who presented with chest pain with some sob that was worse laying flat and non-exertional. Pt was admitted for further work up of chest pain. Cardiology was consulted as trop was mildly elevated and peaked to 207

## 2023-05-10 NOTE — Progress Notes (Signed)
[facility] Progress Note  Subjective: seen in HD, no new c/o's.   Vitals:   05/10/23 1218 05/10/23 1307 05/10/23 1620 05/10/23 1630  BP:  (!) 170/91 126/81 (!) 173/89  Pulse:  75 65 67  Resp:  19 10 10   Temp: 98.2 F (36.8 C) 98.1 F (36.7 C) 98.1 F (36.7 C)   TempSrc: Temporal Oral    SpO2:  100% 100% 100%  Weight:   107.2 kg   Height:        Exam: Gen alert, no distress, on RA Sclera anicteric, throat clear  No jvd or bruits Chest clear bilat to bases RRR no RG Abd soft ntnd no mass or ascites +bs Ext no LE  edema Neuro is alert, Ox 3 , nf    RUA AVF+bruit           Renal-related home meds: - phoslo 2 ac - coreg 25 bid - auryxia 420 ac tid - gabapentin 100 bid - renavite - olmesartan 150 every day      OP HD: MWF [facility] 4h  450/1.5   100kg  2/2 bath   RUA AVF   Heparin none - last OP HD 11/11, post wt 103.6kg, coming over 3-6 kg over past 2 wks - rocaltrol 1.50 mcg - venofer 50mg  /wk - mircera 75 mcg IV q 2, last 11/06, due 11/20     Assessment/ Plan: Chest pain - w/u per primary team. Sp TEE today.  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 Last HD was Monday. HD today off schedule.  HTN - BP's mod high. Cont home meds, prn's , get vol down w/ hd Volume - leaving HD 3-6kg over dry wt lately. Max UF w/ HD today.  Anemia of eskd - Hb 11, no esa needs.  MBD ckd - CCa in range, phos a bit high. Cont binders.  Visual impairment      Vinson Moselle MD  [facility] 05/10/2023, 5:05 PM  Recent Labs  Lab 05/09/23 0227 05/09/23 1813  HGB 11.3*  --   ALBUMIN 3.2*  --   CALCIUM 8.6*  --   PHOS  --  7.5*  CREATININE 10.76*  --   K 4.1  --    No results for input(s): "IRON", "TIBC", "FERRITIN" in the last 168 hours. Inpatient medications:  amLODipine  5 mg Oral Daily   atorvastatin  80 mg Oral Daily   calcitRIOL  1.5 mcg Oral Q M,W,F-2000   calcium acetate  1,334 mg Oral TID WC   carvedilol  25 mg Oral BID WC   famotidine  10 mg Oral Daily   heparin  5,000 Units  Subcutaneous Q8H   irbesartan  150 mg Oral Daily   pantoprazole  20 mg Oral Daily   sodium chloride flush  3 mL Intravenous Q12H    sodium chloride flush

## 2023-05-10 NOTE — Transfer of Care (Signed)
Immediate Anesthesia Transfer of Care Note  Patient: Corey Park  Procedure(s) Performed: TRANSESOPHAGEAL ECHOCARDIOGRAM  Patient Location: PACU  Anesthesia Type:MAC  Level of Consciousness: awake, alert , and oriented  Airway & Oxygen Therapy: Patient Spontanous Breathing  Post-op Assessment: Report given to RN and Post -op Vital signs reviewed and stable  Post vital signs: Reviewed and stable  Last Vitals:  Vitals Value Taken Time  BP    Temp 36.8 C 05/10/23 1218  Pulse 78 05/10/23 1218  Resp 13 05/10/23 1218  SpO2 96 % 05/10/23 1218  Vitals shown include unfiled device data.  Last Pain:  Vitals:   05/10/23 1218  TempSrc: Temporal  PainSc: 0-No pain         Complications: No notable events documented.

## 2023-05-10 NOTE — Progress Notes (Signed)
Received patient in bed to unit.  Alert and oriented.  Informed consent signed and in chart.   TX duration: 3:15  Patient tolerated well.  Transported back to the room  Alert, without acute distress.  Hand-off given to patient's nurse.   Access used: RUE AVF  Access issues: None  Total UF removed: 3500 mL Medication(s) given: None Post HD VS: please see data insert     05/10/23 2045  Vitals  Temp 97.8 F (36.6 C)  Temp Source Oral  BP 117/73  MAP (mmHg) 84  BP Location Left Arm  BP Method Automatic  Patient Position (if appropriate) Lying  Pulse Rate 78  Pulse Rate Source Monitor  ECG Heart Rate 82  Resp 13  Oxygen Therapy  SpO2 98 %  O2 Device Room Air  Patient Activity (if Appropriate) In bed  Pulse Oximetry Type Continuous  Post Treatment  Dialyzer Clearance Lightly streaked  Hemodialysis Intake (mL) 0 mL  Liters Processed 78  Fluid Removed (mL) 3500 mL  Tolerated HD Treatment Yes  Post-Hemodialysis Comments Treatment completed and blood returned without issue.  AVG/AVF Arterial Site Held (minutes) 5 minutes (Gauze applied and secured with paper tape; hemostasis achieved.)  AVG/AVF Venous Site Held (minutes) 5 minutes (Gauze applied and secured with paper tape; hemostasis achieved.)  Note  Patient Observations Patient alert, no c/o voiced, no acute distress noted; patient condition stable for return transport.  Fistula / Graft Right Upper arm Arteriovenous fistula  Placement Date/Time: 10/16/19 6045   Placed prior to admission: No  Orientation: Right  Access Location: Upper arm  Access Type: (c) Arteriovenous fistula  Site Condition No complications  Fistula / Graft Assessment Present;Thrill;Bruit  Status Flushed;Patent;Deaccessed  Drainage Description None      [name]

## 2023-05-10 NOTE — Interval H&P Note (Signed)
History and Physical Interval Note:  05/10/2023 8:42 AM  Corey Park  has presented today for surgery, with the diagnosis of bactermia, ? veg.  The various methods of treatment have been discussed with the patient and family. After consideration of risks, benefits and other options for treatment, the patient has consented to  Procedure(s): TRANSESOPHAGEAL ECHOCARDIOGRAM (N/A) as a surgical intervention.  The patient's history has been reviewed, patient examined, no change in status, stable for surgery.  I have reviewed the patient's chart and labs.  Questions were answered to the patient's satisfaction.     NPO for TEE for MV mass.   Gerri Spore T. Flora Lipps, MD, [facility]  [facility]  [address] [place], [place] [address] [phone]  8:42 AM

## 2023-05-10 NOTE — Anesthesia Preprocedure Evaluation (Addendum)
Anesthesia Evaluation  Patient identified by MRN, date of birth, ID band Patient awake    Reviewed: Allergy & Precautions, NPO status , Patient's Chart, lab work & pertinent test results  History of Anesthesia Complications Negative for: history of anesthetic complications  Airway Mallampati: II  TM Distance: >3 FB Neck ROM: Full    Dental  (+) Dental Advisory Given, Missing, Poor Dentition, Chipped   Pulmonary asthma , COPD,  COPD inhaler   breath sounds clear to auscultation       Cardiovascular hypertension, Pt. on medications (-) angina + CAD (11/2022 Cath: Severe coronary calcification.  Nonobstructive disease throughout the left main, LAD and circumflex.  Continue medical therapy.)   Rhythm:Regular Rate:Normal  03/2023 ECHO: EF 55 to 60%.  1. The LV has normal function, global hypokinesis. There is mild LVH.    2. RVF is normal. The right ventricular size is normal.   3. There is a echodense mobile mass (1.0 cm x 0.49 cm) noted on the posterior leaflet of the mitral valve. Calcification vs vegetation, may need TEE for clarification. Clinical correlation will be beneficial. The mitral valve is degenerative. No evidence of mitral valve regurgitation. No evidence of mitral stenosis. Moderate mitral annular calcification.   4. The aortic valve is normal in structure. Aortic valve regurgitation is not visualized. No aortic stenosis is present.     Neuro/Psych negative neurological ROS     GI/Hepatic Neg liver ROS,GERD  Medicated and Controlled,,  Endo/Other  diabetes (glu 162, controlled with dialysis)    Renal/GU Dialysis and ESRFRenal disease (K+ 4.1)     Musculoskeletal   Abdominal   Peds  Hematology Hb 11.3, plt 178k   Anesthesia Other Findings   Reproductive/Obstetrics                             Anesthesia Physical Anesthesia Plan  ASA: 3  Anesthesia Plan: MAC   Post-op Pain  Management: Minimal or no pain anticipated   Induction:   PONV Risk Score and Plan: 1 and Treatment may vary due to age or medical condition  Airway Management Planned: Natural Airway and Nasal Cannula  Additional Equipment: None  Intra-op Plan:   Post-operative Plan:   Informed Consent: I have reviewed the patients History and Physical, chart, labs and discussed the procedure including the risks, benefits and alternatives for the proposed anesthesia with the patient or authorized representative who has indicated his/her understanding and acceptance.     Dental advisory given  Plan Discussed with: CRNA and Surgeon  Anesthesia Plan Comments:         Anesthesia Quick Evaluation

## 2023-05-10 NOTE — Anesthesia Postprocedure Evaluation (Signed)
Anesthesia Post Note  Patient: Corey Park  Procedure(s) Performed: TRANSESOPHAGEAL ECHOCARDIOGRAM     Patient location during evaluation: Cath Lab Anesthesia Type: MAC Level of consciousness: awake and alert, patient cooperative and oriented Pain management: pain level controlled Vital Signs Assessment: post-procedure vital signs reviewed and stable Respiratory status: spontaneous breathing, nonlabored ventilation and respiratory function stable Cardiovascular status: stable and blood pressure returned to baseline Postop Assessment: no apparent nausea or vomiting Anesthetic complications: no   No notable events documented.  Last Vitals:  Vitals:   05/10/23 0800 05/10/23 1218  BP: (!) 157/82   Pulse: 70   Resp: 10   Temp: 36.6 C 36.8 C  SpO2: 94%     Last Pain:  Vitals:   05/10/23 1218  TempSrc: Temporal  PainSc: 0-No pain                 Mischele Detter,E. Talal Fritchman

## 2023-05-10 NOTE — Progress Notes (Signed)
  Echocardiogram Echocardiogram Transesophageal has been performed.  Corey Park 05/10/2023, 1:27 PM

## 2023-05-10 NOTE — Progress Notes (Addendum)
Pt refused blood test last night. Phlebotomist tried to get blood sample this morning but failed and pt doesn't want to be stuck again. Will try again later this morning.

## 2023-05-10 NOTE — Care Management (Signed)
Transition of Care [facility]) - Inpatient Brief Assessment   Patient Details  Name: Corey Park MRN: [id number] Date of Birth: April 30, 1971  Transition of Care [facility]) CM/SW Contact:    Lockie Pares, RN Phone Number: 05/10/2023, 2:41 PM   Clinical Narrative:  Patient presented with CP has ESRD with dialysis.  ADOH has not been assessed yet this admission, did have some transportation and food insecurity last admit.   The patient will be discussed on progressive rounds. If a need is identified please place a TOC consult  Transition of Care Asessment: Insurance and Status: Insurance coverage has been reviewed Patient has primary care physician: Yes Home environment has been reviewed: Y Prior level of function:: Independent Prior/Current Home Services: No current home services Social Determinants of Health Reivew: SDOH reviewed no interventions necessary Readmission risk has been reviewed: Yes Transition of care needs: no transition of care needs at this time

## 2023-05-10 NOTE — Progress Notes (Signed)
  Progress Note   Patient: Corey Park:[id number] DOB: Oct 30, 1970 DOA: 05/09/2023     0 DOS: the patient was seen and examined on 05/10/2023   Brief hospital course: 52 y.o. male with medical history significant for esrd on hemodialysis, t2dm who presented with chest pain with some sob that was worse laying flat and non-exertional. Pt was admitted for further work up of chest pain. 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 Here with chest pain that has resolved. Troponins have risen from 103 to 275. No overt ischemic changes on EKG.  -Cardiology was consulted. No recs for further intervention was noted. Cardiology to arrange f/u visit for pt  #Echodense mobile mass on posterior leaflet of mitral valve secondary to calcification. Vegetation and endocarditis ruled out -Previous 2d echo reviewed and discussed with Cardiology. Pt had a 1.0 x 0.49cm mobile echodense mass on mitral valve -Cardiology following. Pt now s/p TEE 11/14 with findings of calcification, no vegetations -Blood cx was ordered today, will f/u on results   # ESRD On mwf hemodialysis. Bp elevated today and vascular congestion on cxr though no hypoxia and is breathing comfortably - nephrology consulted for hemodialysis, for HD today   # HFrecoveredEF Most recent EF from a month ago was normal, fluid appears to be managed with dialysis - cont coreg   # T2DM Diet controlled - daily fasting sugar with bmp to monitor   # HTN BP elevated here, possible urgency given chest pain though pain has resolved - resume home coreg and olmesart - amlod added - hemodialysis as above   Subjective: Feeling better today. No further chest pains  Physical Exam: Vitals:   05/10/23 1218 05/10/23 1307 05/10/23 1620 05/10/23 1630  BP:  (!) 170/91 126/81   Pulse:  75 65 67  Resp:  19 10 10   Temp:  98.2 F (36.8 C) 98.1 F (36.7 C) 98.1 F (36.7 C)   TempSrc: Temporal Oral    SpO2:  100% 100% 100%  Weight:   107.2 kg   Height:       General exam: Awake, laying in bed, in nad Respiratory system: Normal respiratory effort, no wheezing Cardiovascular system: regular rate, s1, s2 Gastrointestinal system: Soft, nondistended, positive BS Central nervous system: CN2-12 grossly intact, strength intact Extremities: Perfused, no clubbing Skin: Normal skin turgor, no notable skin lesions seen Psychiatry: Mood normal // no visual hallucinations   Data Reviewed:  There are no new results to review at this time.  Family Communication: Pt in room, family not at bedside  Disposition: Status is: Observation The patient will require care spanning > 2 midnights and should be moved to inpatient because: severity of illness  Planned Discharge Destination: Home     Author: Rickey Barbara, MD 05/10/2023 4:35 PM  For on call review [email or web address].

## 2023-05-11 ENCOUNTER — Other Ambulatory Visit (HOSPITAL_COMMUNITY): Payer: Self-pay

## 2023-05-11 DIAGNOSIS — R079 Chest pain, unspecified: Secondary | ICD-10-CM | POA: Diagnosis not present

## 2023-05-11 LAB — HEPATITIS B SURFACE ANTIBODY, QUANTITATIVE: Hep B S AB Quant (Post): 164 m[IU]/mL

## 2023-05-11 LAB — HIV ANTIBODY (ROUTINE TESTING W REFLEX): HIV Screen 4th Generation wRfx: NONREACTIVE

## 2023-05-11 MED ORDER — ANTICOAGULANT SODIUM CITRATE 4% (200MG/5ML) IV SOLN
5.0000 mL | Status: DC | PRN
Start: 2023-05-11 — End: 2023-05-11

## 2023-05-11 MED ORDER — LIDOCAINE-PRILOCAINE 2.5-2.5 % EX CREA: 1.0000 | TOPICAL_CREAM | CUTANEOUS | Status: DC | PRN

## 2023-05-11 MED ORDER — METOCLOPRAMIDE HCL 5 MG/ML IJ SOLN
5.0000 mg | INTRAMUSCULAR | Status: AC
  Administered 2023-05-11: 5 mg via INTRAVENOUS
  Filled 2023-05-11: qty 2

## 2023-05-11 MED ORDER — ACETAMINOPHEN 325 MG PO TABS: 650.0000 mg | ORAL_TABLET | Freq: Four times a day (QID) | ORAL | Status: DC | PRN

## 2023-05-11 MED ORDER — POLYETHYLENE GLYCOL 3350 17 G PO PACK: 17.0000 g | PACK | Freq: Every day | ORAL | Status: DC | PRN

## 2023-05-11 MED ORDER — ALTEPLASE 2 MG IJ SOLR: 2.0000 mg | Freq: Once | INTRAMUSCULAR | Status: DC | PRN

## 2023-05-11 MED ORDER — NEPRO/CARBSTEADY PO LIQD
237.0000 mL | ORAL | Status: DC | PRN
Start: 2023-05-11 — End: 2023-05-11

## 2023-05-11 MED ORDER — LIDOCAINE HCL (PF) 1 % IJ SOLN: 5.0000 mL | INTRAMUSCULAR | Status: DC | PRN

## 2023-05-11 MED ORDER — PENTAFLUOROPROP-TETRAFLUOROETH EX AERO: 1.0000 | INHALATION_SPRAY | CUTANEOUS | Status: DC | PRN

## 2023-05-11 MED ORDER — HEPARIN SODIUM (PORCINE) 1000 UNIT/ML DIALYSIS
1000.0000 [IU] | INTRAMUSCULAR | Status: DC | PRN
Start: 2023-05-11 — End: 2023-05-11

## 2023-05-11 MED ORDER — PROCHLORPERAZINE EDISYLATE 10 MG/2ML IJ SOLN: 5.0000 mg | Freq: Four times a day (QID) | INTRAMUSCULAR | Status: DC | PRN

## 2023-05-11 MED ORDER — LIDOCAINE HCL (PF) 1 % IJ SOLN
5.0000 mL | INTRAMUSCULAR | Status: DC | PRN
Start: 2023-05-11 — End: 2023-05-11

## 2023-05-11 MED ORDER — MELATONIN 5 MG PO TABS: 5.0000 mg | ORAL_TABLET | Freq: Every evening | ORAL | Status: DC | PRN

## 2023-05-11 MED ORDER — HEPARIN SODIUM (PORCINE) 1000 UNIT/ML DIALYSIS: 1000.0000 [IU] | INTRAMUSCULAR | Status: DC | PRN

## 2023-05-11 MED ORDER — AMLODIPINE BESYLATE 5 MG PO TABS
5.0000 mg | ORAL_TABLET | Freq: Every day | ORAL | 0 refills | Status: DC
  Filled 2023-05-11: qty 30, 30d supply, fill #0

## 2023-05-11 MED ORDER — KETOROLAC TROMETHAMINE 15 MG/ML IJ SOLN
15.0000 mg | INTRAMUSCULAR | Status: AC
  Administered 2023-05-11: 15 mg via INTRAVENOUS
  Filled 2023-05-11: qty 1

## 2023-05-11 MED ORDER — PENTAFLUOROPROP-TETRAFLUOROETH EX AERO
1.0000 | INHALATION_SPRAY | CUTANEOUS | Status: DC | PRN
Start: 2023-05-11 — End: 2023-05-11

## 2023-05-11 MED ORDER — DIPHENHYDRAMINE HCL 50 MG/ML IJ SOLN
12.5000 mg | INTRAMUSCULAR | Status: AC
  Administered 2023-05-11: 12.5 mg via INTRAVENOUS
  Filled 2023-05-11: qty 1

## 2023-05-11 MED ORDER — ANTICOAGULANT SODIUM CITRATE 4% (200MG/5ML) IV SOLN: 5.0000 mL | Status: DC | PRN

## 2023-05-11 MED ORDER — NEPRO/CARBSTEADY PO LIQD: 237.0000 mL | ORAL | Status: DC | PRN

## 2023-05-11 NOTE — Progress Notes (Signed)
Subjective:  Seen in rm, denying cp , sob,tolerated hd yest. (Missed  wed hd )  for hd today  on sched.   Objective Vital signs in last 24 hours: Vitals:   05/10/23 2032 05/10/23 2045 05/10/23 2113 05/11/23 0553  BP: 126/69 117/73 (!) 148/90 (!) 150/74  Pulse: 88 78 75 71  Resp: 13 13 19 19   Temp:  97.8 F (36.6 C) 99 F (37.2 C)   TempSrc:  Oral Oral   SpO2: 100% 98% 98%   Weight:      Height:       Weight change: 3.2 kg  Physical Exam: General: Alert, NAD  Heart: RRR, No MRG Lungs: CTA Bilat  Unlabored on RA  Abdomen: NABS , soft, NT, ND  Extremities: no pedal edema Dialysis Access: RUA AVF + bruit    Renal-related home meds: - phoslo 2 ac - coreg 25 bid - auryxia 420 ac tid - gabapentin 100 bid - renavite - olmesartan 150 every day      OP HD: MWF [facility] 4h  450/1.5   100kg  2/2 bath   RUA AVF   Heparin none - last OP HD 11/11, post wt 103.6kg, coming over 3-6 kg over past 2 wks - rocaltrol 1.50 mcg - venofer 50mg  /wk - mircera 75 mcg IV q 2, last 11/06, due 11/20     Problem/Plan: Chest pain / HO CAD- w/u per primary team./ Card consulted  Sp TEE 11/14= " with findings of calcification, no vegetations " 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 Last HD was Monday. HD yest 11/14  off schedule and today on sched.   HTN - BP's mod high. Cont home meds, prn's , get vol down w/ hd Volume - leaving HD 3-6kg over dry wt lately. Max UF w/ HD today. Told need standing wt  Anemia of eskd - Hb 11, no esa needs.  MBD ckd - CCa in range, phos a bit high. Cont binders.  Visual impairment   Lenny Pastel, PA-C [facility] Beeper [phone] 05/11/2023,8:22 AM  LOS: 1 day   Labs: Basic Metabolic Panel: Recent Labs  Lab 05/09/23 0227 05/09/23 1813 05/10/23 1634  NA 134*  --  137  K 4.1  --  4.8  CL 93*  --  97*  CO2 26  --  25  GLUCOSE 162*  --  139*  BUN 32*  --  51*  CREATININE 10.76*  --  14.44*  CALCIUM 8.6*  --  8.7*  PHOS  --  7.5*  --    Liver Function  Tests: Recent Labs  Lab 05/09/23 0227  AST 22  ALT 14  ALKPHOS 85  BILITOT 0.6  PROT 7.4  ALBUMIN 3.2*   Recent Labs  Lab 05/09/23 0227  LIPASE 59*   No results for input(s): "AMMONIA" in the last 168 hours. CBC: Recent Labs  Lab 05/09/23 0227  WBC 6.5  NEUTROABS 4.0  HGB 11.3*  HCT 34.7*  MCV 95.6  PLT 178   Cardiac Enzymes: No results for input(s): "CKTOTAL", "CKMB", "CKMBINDEX", "TROPONINI" in the last 168 hours. CBG: No results for input(s): "GLUCAP" in the last 168 hours.  Studies/Results: ECHO TEE  Result Date: 05/10/2023    TRANSESOPHOGEAL ECHO REPORT   Patient Name:   Corey Park Date of Exam: 05/10/2023 Medical Rec #:  [id number]       Height:       73.0 in Accession #:    [id number]  Weight:       228.9 lb Date of Birth:  03-05-1971       BSA:          2.279 m Patient Age:    52 years        BP:           170/91 mmHg Patient Gender: M               HR:           80 bpm. Exam Location:  Inpatient Procedure: 2D Echo, 3D Echo, Transesophageal Echo, Cardiac Doppler and Color            Doppler Indications:    Mitral valve mass  History:        Patient has no prior history of Echocardiogram examinations and                 Patient has prior history of Echocardiogram examinations. CHF,                 Signs/Symptoms:Chest Pain; Risk Factors:Hypertension.  Sonographer:    Sheralyn Boatman RDCS Referring Phys: [id number] SHENG L HALEY PROCEDURE: After discussion of the risks and benefits of a TEE, an informed consent was obtained from the patient. TEE procedure time was 11 minutes. The transesophogeal probe was passed without difficulty through the esophogus of the patient. Imaged were obtained with the patient in a left lateral decubitus position. Sedation performed by different physician. The patient was monitored while under deep sedation. Anesthestetic sedation was provided intravenously by Anesthesiology: 240mg  of Propofol, 100mg  of Lidocaine. Image quality was excellent. The  patient's vital signs; including heart rate, blood pressure, and oxygen saturation; remained stable throughout the procedure. The patient developed no complications during the procedure.  IMPRESSIONS  1. No MV mass. There is mitral annular calcium on the posterior annulus above P2 which moves wih the annulus, mimicking the appearance of a mass. No vegetation or destruction of the valve noted. The mitral valve is degenerative. Mild mitral valve regurgitation.  2. Left ventricular ejection fraction, by estimation, is 55 to 60%. Left ventricular ejection fraction by 3D volume is 55 %. The left ventricle has normal function. The left ventricle has no regional wall motion abnormalities.  3. Right ventricular systolic function is normal. The right ventricular size is normal.  4. Left atrial size was mildly dilated. No left atrial/left atrial appendage thrombus was detected.  5. The aortic valve is tricuspid. Aortic valve regurgitation is not visualized. No aortic stenosis is present.  6. There is Moderate (Grade III) protruding plaque involving the aortic arch and descending aorta.  7. Agitated saline contrast bubble study was negative, with no evidence of any interatrial shunt. FINDINGS  Left Ventricle: Left ventricular ejection fraction, by estimation, is 55 to 60%. Left ventricular ejection fraction by 3D volume is 55 %. The left ventricle has normal function. The left ventricle has no regional wall motion abnormalities. The left ventricular internal cavity size was normal in size. Right Ventricle: The right ventricular size is normal. No increase in right ventricular wall thickness. Right ventricular systolic function is normal. Left Atrium: Left atrial size was mildly dilated. No left atrial/left atrial appendage thrombus was detected. Right Atrium: Right atrial size was normal in size. Pericardium: There is no evidence of pericardial effusion. Mitral Valve: No MV mass. There is mitral annular calcium on the  posterior annulus above P2 which moves wih the annulus, mimicking the appearance of a mass. No  vegetation or destruction of the valve noted. The mitral valve is degenerative in appearance. Mild to moderate mitral annular calcification. Mild mitral valve regurgitation. Tricuspid Valve: The tricuspid valve is grossly normal. Tricuspid valve regurgitation is trivial. No evidence of tricuspid stenosis. Aortic Valve: The aortic valve is tricuspid. Aortic valve regurgitation is not visualized. No aortic stenosis is present. Pulmonic Valve: The pulmonic valve was grossly normal. Pulmonic valve regurgitation is trivial. No evidence of pulmonic stenosis. Aorta: The aortic root and ascending aorta are structurally normal, with no evidence of dilitation. There is moderate (Grade III) protruding plaque involving the aortic arch and descending aorta. Venous: The left upper pulmonary vein, left lower pulmonary vein, right lower pulmonary vein and right upper pulmonary vein are normal. IAS/Shunts: The atrial septum is grossly normal. Agitated saline contrast was given intravenously to evaluate for intracardiac shunting. Agitated saline contrast bubble study was negative, with no evidence of any interatrial shunt. Additional Comments: Spectral Doppler performed.  3D Volume EF LV 3D EF:    Left ventricular ejection fraction by 3D volume is 55              %.  3D Volume EF LV 3D EF:    55.40 % LV 3D EDV:   65800.00 mm LV 3D ESV:   29300.00 mm LV 3D SV:    36400.00 mm  3D Volume EF: 3D EF:        55 %  AORTA Ao Root diam: 3.82 cm Ao Asc diam:  3.60 cm Lennie Odor MD Electronically signed by Lennie Odor MD Signature Date/Time: 05/10/2023/1:26:44 PM    Final    EP STUDY  Result Date: 05/10/2023 See surgical note for result.  Medications:   amLODipine  5 mg Oral Daily   atorvastatin  80 mg Oral Daily   calcitRIOL  1.5 mcg Oral Q M,W,F-2000   calcium acetate  1,334 mg Oral TID WC   carvedilol  25 mg Oral BID WC    famotidine  10 mg Oral Daily   heparin  5,000 Units Subcutaneous Q8H   irbesartan  150 mg Oral Daily   pantoprazole  20 mg Oral Daily   sodium chloride flush  3 mL Intravenous Q12H

## 2023-05-11 NOTE — Progress Notes (Signed)
D/C order noted. Order states pt can d/c if stable after HD. Contacted [facility] to advise clinic of pt's likely d/c today and that pt should resume care on Monday.   Olivia Canter Renal Navigator [phone]

## 2023-05-11 NOTE — Plan of Care (Signed)
  Problem: Education: Goal: Knowledge of General Education information will improve Description: Including pain rating scale, medication(s)/side effects and non-pharmacologic comfort measures 05/11/2023 1443 by Herma Carson, RN Outcome: Adequate for Discharge 05/11/2023 0731 by Herma Carson, RN Outcome: Progressing   Problem: Health Behavior/Discharge Planning: Goal: Ability to manage health-related needs will improve 05/11/2023 1443 by Herma Carson, RN Outcome: Adequate for Discharge 05/11/2023 0731 by Herma Carson, RN Outcome: Progressing   Problem: Clinical Measurements: Goal: Ability to maintain clinical measurements within normal limits will improve 05/11/2023 1443 by Herma Carson, RN Outcome: Adequate for Discharge 05/11/2023 0731 by Herma Carson, RN Outcome: Progressing Goal: Will remain free from infection 05/11/2023 1443 by Herma Carson, RN Outcome: Adequate for Discharge 05/11/2023 0731 by Herma Carson, RN Outcome: Progressing Goal: Diagnostic test results will improve 05/11/2023 1443 by Herma Carson, RN Outcome: Adequate for Discharge 05/11/2023 0731 by Herma Carson, RN Outcome: Progressing Goal: Respiratory complications will improve 05/11/2023 1443 by Herma Carson, RN Outcome: Adequate for Discharge 05/11/2023 0731 by Herma Carson, RN Outcome: Progressing Goal: Cardiovascular complication will be avoided 05/11/2023 1443 by Herma Carson, RN Outcome: Adequate for Discharge 05/11/2023 0731 by Herma Carson, RN Outcome: Progressing   Problem: Activity: Goal: Risk for activity intolerance will decrease 05/11/2023 1443 by Herma Carson, RN Outcome: Adequate for Discharge 05/11/2023 0731 by Herma Carson, RN Outcome: Progressing   Problem: Nutrition: Goal: Adequate nutrition will be maintained 05/11/2023 1443 by Herma Carson, RN Outcome: Adequate for Discharge 05/11/2023 0731 by Herma Carson, RN Outcome:  Progressing   Problem: Coping: Goal: Level of anxiety will decrease 05/11/2023 1443 by Herma Carson, RN Outcome: Adequate for Discharge 05/11/2023 0731 by Herma Carson, RN Outcome: Progressing   Problem: Elimination: Goal: Will not experience complications related to bowel motility 05/11/2023 1443 by Herma Carson, RN Outcome: Adequate for Discharge 05/11/2023 0731 by Herma Carson, RN Outcome: Progressing Goal: Will not experience complications related to urinary retention 05/11/2023 1443 by Herma Carson, RN Outcome: Adequate for Discharge 05/11/2023 0731 by Herma Carson, RN Outcome: Progressing   Problem: Pain Management: Goal: General experience of comfort will improve 05/11/2023 1443 by Herma Carson, RN Outcome: Adequate for Discharge 05/11/2023 0731 by Herma Carson, RN Outcome: Progressing   Problem: Safety: Goal: Ability to remain free from injury will improve 05/11/2023 1443 by Herma Carson, RN Outcome: Adequate for Discharge 05/11/2023 0731 by Herma Carson, RN Outcome: Progressing   Problem: Skin Integrity: Goal: Risk for impaired skin integrity will decrease 05/11/2023 1443 by Herma Carson, RN Outcome: Adequate for Discharge 05/11/2023 0731 by Herma Carson, RN Outcome: Progressing

## 2023-05-11 NOTE — Progress Notes (Signed)
[facility] Patient Discharge Orders- [facility] CLINIC: [facility]  Patient's name: Corey Park Admit/DC Dates: 05/09/2023 - 05/11/23  Discharge Diagnoses: Chest pain = ho card. No overt ischemic  EKG changes   ( card  consult  no other wu. 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 Neg bc  , no veg . HTN elavation=  vol  ,bp meds  Aranesp: Given: no   Date and amount of last dose: no  Last Hgb: 11 PRBC's Given: no Date/# of units: no ESA dose for discharge: mircera 75 mcg IV q 2 weeks next  perprotocal 05/16/23 IV Iron dose at discharge: no  Heparin change: no  EDW Change: yes New EDW: 102  Bath Change: no  Access intervention/Change: no Details:  Hectorol/Calcitriol change: no  Discharge Labs: Calcium   8.7 Phosphorus 7.5 Albumin 3.2 K+ 4.1  IV Antibiotics: no Details:  On Coumadin?: no Last INR: Next INR: Managed By:   OTHER/APPTS/LAB ORDERS:    D/C Meds to be reconciled by nurse after every discharge.  Completed By:   Reviewed by: MD:______ RN_______

## 2023-05-11 NOTE — TOC Transition Note (Addendum)
Transition of Care [facility]) - CM/SW Discharge Note   Patient Details  Name: Manase Dreis MRN: [id number] Date of Birth: 12-05-1970  Transition of Care [facility]) CM/SW Contact:  Leone Haven, RN Phone Number: 05/11/2023, 1:56 PM   Clinical Narrative:    For dc today, from home with son, he states he will need a cab voucher at dc.  He has insurance on file and PCP, he has no HH or DME at this time.  He gets his medications from [facility] on [address] or [facility] at [facility].  He will get HD today and then go home.  NCM will assist cab voucher.  He has a Electronics engineer on file.         Patient Goals and CMS Choice      Discharge Placement                         Discharge Plan and Services Additional resources added to the After Visit Summary for                                       Social Determinants of Health (SDOH) Interventions SDOH Screenings   Food Insecurity: No Food Insecurity (05/09/2023)  Recent Concern: Food Insecurity - Food Insecurity Present (02/13/2023)  Housing: Low Risk  (05/09/2023)  Recent Concern: Housing - Medium Risk (04/02/2023)  Transportation Needs: Unmet Transportation Needs (05/09/2023)  Utilities: Not At Risk (05/09/2023)  Depression (PHQ2-9): Low Risk  (08/10/2022)  Tobacco Use: Low Risk  (05/09/2023)     Readmission Risk Interventions    12/13/2022   10:39 AM  Readmission Risk Prevention Plan  Transportation Screening Complete  PCP or Specialist Appt within 3-5 Days Complete  HRI or Home Care Consult Complete  Social Work Consult for Recovery Care Planning/Counseling Complete  Palliative Care Screening Complete  Medication Review Oceanographer) Referral to Pharmacy

## 2023-05-11 NOTE — Plan of Care (Signed)

## 2023-05-11 NOTE — Discharge Summary (Signed)
Physician Discharge Summary   Patient: Corey Park MRN: [id number] DOB: [birth year]  Admit date:     05/09/2023  Discharge date: 05/11/23  Discharge Physician: Rickey Barbara   PCP: [name], NP   Recommendations at discharge:    Follow up with PCP in 1-2 weeks Follow up with scheduled HD  Discharge Diagnoses: Principal Problem:   Chest pain Active Problems:   History of GI bleed   ESRD (end stage renal disease) on dialysis (HCC)   (HFpEF) heart failure with preserved ejection fraction (HCC)   DM (diabetes mellitus), type 2 (HCC)   HTN (hypertension)   Anemia in chronic kidney disease (CKD)   Obesity (BMI 30-39.9)   Mitral valve mass  Resolved Problems:   * No resolved hospital problems. *  Hospital Course: 52 y.o. male with medical history significant for esrd on hemodialysis, t2dm who presented with chest pain with some sob that was worse laying flat and non-exertional. Pt was admitted for further work up of chest pain. 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 Here with chest pain that has resolved. Troponins have risen from 103 to 275. No overt ischemic changes on EKG.  -Cardiology was consulted. Recs for no further intervention was noted. Cardiology to arrange f/u visit for pt   #Echodense mobile mass on posterior leaflet of mitral valve secondary to calcification. Vegetation and endocarditis ruled out -Previous 2d echo reviewed and discussed with Cardiology. Pt had a 1.0 x 0.49cm mobile echodense mass on mitral valve -Cardiology following. Pt now s/p TEE 11/14 with findings of calcification, no vegetations -Blood cx neg x 2 days   # ESRD On mwf hemodialysis. Bp elevated today and vascular congestion on cxr though no hypoxia and is breathing comfortably - nephrology consulted for hemodialysis, pt to resume MWF HD   #  HFrecoveredEF Most recent EF from a month ago was normal, fluid appears to be managed with dialysis - cont coreg   # T2DM Diet controlled - daily fasting sugar with bmp to monitor   # HTN BP elevated here, possible urgency given chest pain though pain has resolved - resumed home coreg and olmesart - amlodipine added this visit - hemodialysis as above    Consultants: Cardiology, Nephrology Procedures performed: TEE  Disposition: Home Diet recommendation:  Renal diet DISCHARGE MEDICATION: Allergies as of 05/11/2023       Reactions   Imdur [isosorbide Nitrate] Other (See Comments)   Endorsed headache in the past - willing to retry        Medication List     STOP taking these medications    ondansetron 4 MG disintegrating tablet Commonly known as: ZOFRAN-ODT       TAKE these medications    amLODipine 5 MG tablet Commonly known as: NORVASC Take 1 tablet (5 mg total) by mouth daily. Start taking on: May 12, 2023   atorvastatin 80 MG tablet Commonly known as: LIPITOR Take 1 tablet (80 mg total) by mouth daily.   calcium acetate 667 MG capsule Commonly known as: PHOSLO Take 2 capsules (1,334 mg total) by mouth with breakfast, with lunch, and with evening meal. What changed: how much to take   carvedilol 25 MG tablet Commonly known as: COREG Take 1 tablet (25 mg total) by mouth 2 (two) times daily with a meal.   famotidine 20 MG tablet Commonly known as:  PEPCID Take 20 mg by mouth daily.   ferric citrate 1 GM 210 MG(Fe) tablet Commonly known as: Auryxia Take 2 tablets (420 mg total) by mouth daily.   gabapentin 100 MG capsule Commonly known as: Neurontin Take 1 capsule (100 mg total) by mouth 2 (two) times daily.   MIRCERA IJ Inject 1 Syringe into the skin every 14 (fourteen) days.   multivitamin Tabs tablet Take 1 tablet by mouth daily.   nitroGLYCERIN 0.4 MG SL tablet Commonly known as: NITROSTAT Place 0.4 mg under the tongue every 5  (five) minutes as needed for chest pain.   olmesartan 20 MG tablet Commonly known as: BENICAR Take 1 tablet (20 mg total) by mouth daily.   pantoprazole 20 MG tablet Commonly known as: PROTONIX Take 20 mg by mouth daily. What changed: Another medication with the same name was removed. Continue taking this medication, and follow the directions you see here.        Discharge Exam: Filed Weights   05/09/23 1400 05/09/23 2054 05/10/23 1620  Weight: 104 kg 103.8 kg 107.2 kg   General exam: Awake, laying in bed, in nad Respiratory system: Normal respiratory effort, no wheezing Cardiovascular system: regular rate, s1, s2 Gastrointestinal system: Soft, nondistended, positive BS Central nervous system: CN2-12 grossly intact, strength intact Extremities: Perfused, no clubbing Skin: Normal skin turgor, no notable skin lesions seen Psychiatry: Mood normal // no visual hallucinations   Condition at discharge: fair  The results of significant diagnostics from this hospitalization (including imaging, microbiology, ancillary and laboratory) are listed below for reference.   Imaging Studies: ECHO TEE  Result Date: 05/10/2023    TRANSESOPHOGEAL ECHO REPORT   Patient Name:   Corey Park Date of Exam: 05/10/2023 Medical Rec #:  [id number]       Height:       73.0 in Accession #:    [id number]      Weight:       228.9 lb Date of Birth:  1970-11-13       BSA:          2.279 m Patient Age:    52 years        BP:           170/91 mmHg Patient Gender: M               HR:           80 bpm. Exam Location:  Inpatient Procedure: 2D Echo, 3D Echo, Transesophageal Echo, Cardiac Doppler and Color            Doppler Indications:    Mitral valve mass  History:        Patient has no prior history of Echocardiogram examinations and                 Patient has prior history of Echocardiogram examinations. CHF,                 Signs/Symptoms:Chest Pain; Risk Factors:Hypertension.  Sonographer:    Sheralyn Boatman RDCS  Referring Phys: [id number] SHENG L HALEY PROCEDURE: After discussion of the risks and benefits of a TEE, an informed consent was obtained from the patient. TEE procedure time was 11 minutes. The transesophogeal probe was passed without difficulty through the esophogus of the patient. Imaged were obtained with the patient in a left lateral decubitus position. Sedation performed by different physician. The patient was monitored while under deep sedation. Anesthestetic sedation was provided intravenously by Anesthesiology: 240mg  of  Propofol, 100mg  of Lidocaine. Image quality was excellent. The patient's vital signs; including heart rate, blood pressure, and oxygen saturation; remained stable throughout the procedure. The patient developed no complications during the procedure.  IMPRESSIONS  1. No MV mass. There is mitral annular calcium on the posterior annulus above P2 which moves wih the annulus, mimicking the appearance of a mass. No vegetation or destruction of the valve noted. The mitral valve is degenerative. Mild mitral valve regurgitation.  2. Left ventricular ejection fraction, by estimation, is 55 to 60%. Left ventricular ejection fraction by 3D volume is 55 %. The left ventricle has normal function. The left ventricle has no regional wall motion abnormalities.  3. Right ventricular systolic function is normal. The right ventricular size is normal.  4. Left atrial size was mildly dilated. No left atrial/left atrial appendage thrombus was detected.  5. The aortic valve is tricuspid. Aortic valve regurgitation is not visualized. No aortic stenosis is present.  6. There is Moderate (Grade III) protruding plaque involving the aortic arch and descending aorta.  7. Agitated saline contrast bubble study was negative, with no evidence of any interatrial shunt. FINDINGS  Left Ventricle: Left ventricular ejection fraction, by estimation, is 55 to 60%. Left ventricular ejection fraction by 3D volume is 55 %. The left  ventricle has normal function. The left ventricle has no regional wall motion abnormalities. The left ventricular internal cavity size was normal in size. Right Ventricle: The right ventricular size is normal. No increase in right ventricular wall thickness. Right ventricular systolic function is normal. Left Atrium: Left atrial size was mildly dilated. No left atrial/left atrial appendage thrombus was detected. Right Atrium: Right atrial size was normal in size. Pericardium: There is no evidence of pericardial effusion. Mitral Valve: No MV mass. There is mitral annular calcium on the posterior annulus above P2 which moves wih the annulus, mimicking the appearance of a mass. No vegetation or destruction of the valve noted. The mitral valve is degenerative in appearance. Mild to moderate mitral annular calcification. Mild mitral valve regurgitation. Tricuspid Valve: The tricuspid valve is grossly normal. Tricuspid valve regurgitation is trivial. No evidence of tricuspid stenosis. Aortic Valve: The aortic valve is tricuspid. Aortic valve regurgitation is not visualized. No aortic stenosis is present. Pulmonic Valve: The pulmonic valve was grossly normal. Pulmonic valve regurgitation is trivial. No evidence of pulmonic stenosis. Aorta: The aortic root and ascending aorta are structurally normal, with no evidence of dilitation. There is moderate (Grade III) protruding plaque involving the aortic arch and descending aorta. Venous: The left upper pulmonary vein, left lower pulmonary vein, right lower pulmonary vein and right upper pulmonary vein are normal. IAS/Shunts: The atrial septum is grossly normal. Agitated saline contrast was given intravenously to evaluate for intracardiac shunting. Agitated saline contrast bubble study was negative, with no evidence of any interatrial shunt. Additional Comments: Spectral Doppler performed.  3D Volume EF LV 3D EF:    Left ventricular ejection fraction by 3D volume is 55               %.  3D Volume EF LV 3D EF:    55.40 % LV 3D EDV:   65800.00 mm LV 3D ESV:   29300.00 mm LV 3D SV:    36400.00 mm  3D Volume EF: 3D EF:        55 %  AORTA Ao Root diam: 3.82 cm Ao Asc diam:  3.60 cm Lennie Odor MD Electronically signed by Lennie Odor MD Signature Date/Time:  05/10/2023/1:26:44 PM    Final    EP STUDY  Result Date: 05/10/2023 See surgical note for result.  DG Chest Portable 1 View  Result Date: 05/09/2023 CLINICAL DATA:  Midsternal chest pain EXAM: PORTABLE CHEST 1 VIEW COMPARISON:  04/02/2023 FINDINGS: Cardiomegaly and congested appearance of vessels. Normal appearance of the aortic contours. Accentuated interstitial lung markings are improved. No effusion or pneumothorax. No osseous finding. Artifact from EKG leads. IMPRESSION: Cardiomegaly and vascular congestion. Electronically Signed   By: Tiburcio Pea M.D.   On: 05/09/2023 04:37    Microbiology: Results for orders placed or performed during the hospital encounter of 05/09/23  Culture, blood (Routine X 2) w Reflex to ID Panel     Status: None (Preliminary result)   Collection Time: 05/09/23  6:13 PM   Specimen: BLOOD LEFT FOREARM  Result Value Ref Range Status   Specimen Description BLOOD LEFT FOREARM  Final   Special Requests   Final    BOTTLES DRAWN AEROBIC AND ANAEROBIC Blood Culture adequate volume   Culture   Final    NO GROWTH 2 DAYS Performed at [facility], [address], [place], [place] [address]    Report Status PENDING  Incomplete  Culture, blood (Routine X 2) w Reflex to ID Panel     Status: None (Preliminary result)   Collection Time: 05/09/23  6:22 PM   Specimen: BLOOD  Result Value Ref Range Status   Specimen Description BLOOD LEFT ANTECUBITAL  Final   Special Requests   Final    BOTTLES DRAWN AEROBIC ONLY Blood Culture adequate volume   Culture   Final    NO GROWTH 2 DAYS Performed at [facility], [address] [place], [place] [address]    Report Status PENDING   Incomplete    Labs: CBC: Recent Labs  Lab 05/09/23 0227  WBC 6.5  NEUTROABS 4.0  HGB 11.3*  HCT 34.7*  MCV 95.6  PLT 178   Basic Metabolic Panel: Recent Labs  Lab 05/09/23 0227 05/09/23 1813 05/10/23 1634  NA 134*  --  137  K 4.1  --  4.8  CL 93*  --  97*  CO2 26  --  25  GLUCOSE 162*  --  139*  BUN 32*  --  51*  CREATININE 10.76*  --  14.44*  CALCIUM 8.6*  --  8.7*  PHOS  --  7.5*  --    Liver Function Tests: Recent Labs  Lab 05/09/23 0227  AST 22  ALT 14  ALKPHOS 85  BILITOT 0.6  PROT 7.4  ALBUMIN 3.2*   CBG: No results for input(s): "GLUCAP" in the last 168 hours.  Discharge time spent: less than 30 minutes.  Signed: Rickey Barbara, MD [facility] 05/11/2023

## 2023-05-11 NOTE — Plan of Care (Signed)

## 2023-05-13 ENCOUNTER — Telehealth: Payer: Self-pay | Admitting: Nephrology

## 2023-05-13 NOTE — Telephone Encounter (Signed)
Transition of care contact from inpatient facility  Date of Discharge: 04/1523 Date of Contact: attempted 04/1723 Method of contact: Phone  Attempted to contact patient to discuss transition of care from inpatient admission. Patient did not answer the phone. Message was left on the patient's voicemail with call back number [phone].

## 2023-05-14 ENCOUNTER — Telehealth: Payer: Self-pay

## 2023-05-14 LAB — CULTURE, BLOOD (ROUTINE X 2)
Culture: NO GROWTH
Culture: NO GROWTH
Special Requests: ADEQUATE
Special Requests: ADEQUATE

## 2023-05-14 NOTE — Transitions of Care (Post Inpatient/ED Visit) (Signed)
   05/14/2023  Name: Corey Park MRN: [id number] DOB: 28-Oct-1970  Today's TOC FU Call Status: Today's TOC FU Call Status:: Unsuccessful Call (1st Attempt) Unsuccessful Call (1st Attempt) Date: 05/14/23  Attempted to reach the patient regarding the most recent Inpatient/ED visit.  Follow Up Plan: Additional outreach attempts will be made to reach the patient to complete the Transitions of Care (Post Inpatient/ED visit) call.   Jodelle Gross RN, BSN, CCM RN Care Manager  Transitions of Care  [facility]  [phone]

## 2023-05-15 ENCOUNTER — Telehealth: Payer: Self-pay

## 2023-05-15 NOTE — Transitions of Care (Post Inpatient/ED Visit) (Signed)
   05/15/2023  Name: Brextyn Linwood MRN: [id number] DOB: [birth year]  Today's TOC FU Call Status: Today's TOC FU Call Status:: Unsuccessful Call (2nd Attempt) Unsuccessful Call (2nd Attempt) Date: 05/15/23  Attempted to reach the patient regarding the most recent Inpatient/ED visit.  Follow Up Plan: Additional outreach attempts will be made to reach the patient to complete the Transitions of Care (Post Inpatient/ED visit) call.   Jodelle Gross RN, BSN, CCM RN Care Manager  Transitions of Care  [facility] - [facility]  [phone]

## 2023-05-30 ENCOUNTER — Ambulatory Visit: Payer: Medicare Other | Admitting: Podiatry

## 2023-06-01 ENCOUNTER — Emergency Department (HOSPITAL_COMMUNITY): Payer: Medicare Other

## 2023-06-01 ENCOUNTER — Other Ambulatory Visit: Payer: Self-pay

## 2023-06-01 ENCOUNTER — Encounter (HOSPITAL_COMMUNITY): Payer: Self-pay | Admitting: Emergency Medicine

## 2023-06-01 ENCOUNTER — Emergency Department (HOSPITAL_COMMUNITY)
Admission: EM | Admit: 2023-06-01 | Discharge: 2023-06-01 | Disposition: A | Discharge status: Home / Self Care | Payer: Medicare Other | Attending: Emergency Medicine | Admitting: Emergency Medicine

## 2023-06-01 DIAGNOSIS — Z79899 Other long term (current) drug therapy: Secondary | ICD-10-CM | POA: Insufficient documentation

## 2023-06-01 DIAGNOSIS — Z992 Dependence on renal dialysis: Secondary | ICD-10-CM | POA: Insufficient documentation

## 2023-06-01 DIAGNOSIS — E1122 Type 2 diabetes mellitus with diabetic chronic kidney disease: Secondary | ICD-10-CM | POA: Insufficient documentation

## 2023-06-01 DIAGNOSIS — R0602 Shortness of breath: Secondary | ICD-10-CM | POA: Insufficient documentation

## 2023-06-01 DIAGNOSIS — I714 Abdominal aortic aneurysm, without rupture, unspecified: Secondary | ICD-10-CM | POA: Diagnosis not present

## 2023-06-01 DIAGNOSIS — R0789 Other chest pain: Secondary | ICD-10-CM | POA: Insufficient documentation

## 2023-06-01 DIAGNOSIS — N186 End stage renal disease: Secondary | ICD-10-CM | POA: Diagnosis not present

## 2023-06-01 DIAGNOSIS — R112 Nausea with vomiting, unspecified: Secondary | ICD-10-CM | POA: Insufficient documentation

## 2023-06-01 DIAGNOSIS — I509 Heart failure, unspecified: Secondary | ICD-10-CM | POA: Insufficient documentation

## 2023-06-01 DIAGNOSIS — R079 Chest pain, unspecified: Secondary | ICD-10-CM | POA: Diagnosis present

## 2023-06-01 DIAGNOSIS — I132 Hypertensive heart and chronic kidney disease with heart failure and with stage 5 chronic kidney disease, or end stage renal disease: Secondary | ICD-10-CM | POA: Insufficient documentation

## 2023-06-01 LAB — CBC
HCT: 34.8 % — ABNORMAL LOW (ref 39.0–52.0)
Hemoglobin: 11.4 g/dL — ABNORMAL LOW (ref 13.0–17.0)
MCH: 31.5 pg (ref 26.0–34.0)
MCHC: 32.8 g/dL (ref 30.0–36.0)
MCV: 96.1 fL (ref 80.0–100.0)
Platelets: 131 10*3/uL — ABNORMAL LOW (ref 150–400)
RBC: 3.62 MIL/uL — ABNORMAL LOW (ref 4.22–5.81)
RDW: 17.3 % — ABNORMAL HIGH (ref 11.5–15.5)
WBC: 6.2 10*3/uL (ref 4.0–10.5)
nRBC: 0.3 % — ABNORMAL HIGH (ref 0.0–0.2)

## 2023-06-01 LAB — BASIC METABOLIC PANEL
Anion gap: 15 (ref 5–15)
BUN: 42 mg/dL — ABNORMAL HIGH (ref 6–20)
CO2: 27 mmol/L (ref 22–32)
Calcium: 9.1 mg/dL (ref 8.9–10.3)
Chloride: 96 mmol/L — ABNORMAL LOW (ref 98–111)
Creatinine, Ser: 13.43 mg/dL — ABNORMAL HIGH (ref 0.61–1.24)
GFR, Estimated: 4 mL/min — ABNORMAL LOW (ref >60)
Glucose, Bld: 130 mg/dL — ABNORMAL HIGH (ref 70–99)
Potassium: 4.6 mmol/L (ref 3.5–5.1)
Sodium: 138 mmol/L (ref 135–145)

## 2023-06-01 LAB — HEPATIC FUNCTION PANEL
ALT: 13 U/L (ref 0–44)
AST: 20 U/L (ref 15–41)
Albumin: 3.4 g/dL — ABNORMAL LOW (ref 3.5–5.0)
Alkaline Phosphatase: 79 U/L (ref 38–126)
Bilirubin, Direct: <0.1 mg/dL (ref 0.0–0.2)
Total Bilirubin: 0.5 mg/dL (ref <1.2)
Total Protein: 7.7 g/dL (ref 6.5–8.1)

## 2023-06-01 LAB — LIPASE, BLOOD: Lipase: 47 U/L (ref 11–51)

## 2023-06-01 LAB — TROPONIN I (HIGH SENSITIVITY)
Troponin I (High Sensitivity): 113 ng/L (ref <18)
Troponin I (High Sensitivity): 133 ng/L (ref <18)
Troponin I (High Sensitivity): 181 ng/L (ref <18)
Troponin I (High Sensitivity): 251 ng/L (ref <18)

## 2023-06-01 MED ORDER — LABETALOL HCL 5 MG/ML IV SOLN
20.0000 mg | Freq: Once | INTRAVENOUS | Status: DC
  Filled 2023-06-01: qty 4

## 2023-06-01 MED ORDER — ONDANSETRON HCL 4 MG/2ML IJ SOLN
4.0000 mg | Freq: Once | INTRAMUSCULAR | Status: AC
  Administered 2023-06-01: 4 mg via INTRAVENOUS
  Filled 2023-06-01: qty 2

## 2023-06-01 MED ORDER — NITROGLYCERIN 0.4 MG SL SUBL
0.4000 mg | SUBLINGUAL_TABLET | Freq: Once | SUBLINGUAL | Status: AC
  Administered 2023-06-01: 0.4 mg via SUBLINGUAL
  Filled 2023-06-01: qty 1

## 2023-06-01 MED ORDER — FENTANYL CITRATE PF 50 MCG/ML IJ SOSY
50.0000 ug | PREFILLED_SYRINGE | Freq: Once | INTRAMUSCULAR | Status: AC
  Administered 2023-06-01: 50 ug via INTRAVENOUS
  Filled 2023-06-01: qty 1

## 2023-06-01 MED ORDER — ALUM & MAG HYDROXIDE-SIMETH 200-200-20 MG/5ML PO SUSP
30.0000 mL | Freq: Once | ORAL | Status: AC
  Administered 2023-06-01: 30 mL via ORAL
  Filled 2023-06-01: qty 30

## 2023-06-01 MED ORDER — HYDRALAZINE HCL 20 MG/ML IJ SOLN
5.0000 mg | Freq: Once | INTRAMUSCULAR | Status: AC
  Administered 2023-06-01: 5 mg via INTRAVENOUS
  Filled 2023-06-01: qty 1

## 2023-06-01 MED ORDER — IOHEXOL 350 MG/ML SOLN
100.0000 mL | Freq: Once | INTRAVENOUS | Status: AC | PRN
  Administered 2023-06-01: 100 mL via INTRAVENOUS

## 2023-06-01 NOTE — Discharge Instructions (Signed)
Testing is negative for heart attack or blood clot in the lung.  Your heart enzymes are positive which is stable for you likely due to your kidney disease.  Suspect your pain is likely coming from your stomach or esophagus.  Continue your PPI and avoid alcohol, caffeine, NSAID medications, spicy foods. Return to the ED with exertional chest pain, pain associate with shortness of breath, nausea, vomiting, sweating or other concerns.

## 2023-06-01 NOTE — ED Triage Notes (Signed)
Pt BIB EMS from home.  Sudden onset chest pain x 30 min.  Full dialysis tx yesterday reported with no issues.  Pt reports no n/v/d or shob.  Pt appears agitated at time of triage.

## 2023-06-01 NOTE — ED Notes (Signed)
Patient passed PO challenge.

## 2023-06-01 NOTE — ED Provider Notes (Signed)
[place] [facility] AT [facility] Provider Note   CSN: [id number] Arrival date & time: 06/01/23  0010     History  Chief Complaint  Patient presents with   Chest Pain    Corey Park is a 52 y.o. male.   Hx ESRD on dialysis, hypertension, diabetes, CHF, dilated cardiomyopathy, legally blind, presents with central chest pain that onset about 11:30 PM while he was sitting resting.  Pain has been constant.  Somewhat worse with palpation.  No radiation of the pain to his arm, neck or back.  Some associated shortness of breath.  No nausea, vomiting, diaphoresis.  He has had this pain in the past and this is similar to his previous evaluations for chest pain.  States compliance with his dialysis schedule.  Reports he has "blockages" in his heart that are being medically managed.  No stents.  Denies any cough, fever, runny nose, sore throat.  Pain is in the center of his chest and radiates to his mid back.  This is similar to when he was hospitalized last month.  He was found to have a mitral valve that was ruled out to be endocarditis.  He reports the pain has been constant since EMS picked him up.  He is not cooperative with history taking and keeps pulling the blanket over his head.  The history is provided by the patient.  Chest Pain Associated symptoms: shortness of breath   Associated symptoms: no abdominal pain, no dizziness, no fever, no headache, no nausea, no vomiting and no weakness        Home Medications Prior to Admission medications   Medication Sig Start Date End Date Taking? Authorizing Provider  amLODipine (NORVASC) 5 MG tablet Take 1 tablet (5 mg total) by mouth daily. 05/12/23 06/11/23  Jerald Kief, MD  atorvastatin (LIPITOR) 80 MG tablet Take 1 tablet (80 mg total) by mouth daily. 12/15/22   Modena Slater, DO  calcium acetate (PHOSLO) 667 MG capsule Take 2 capsules (1,334 mg total) by mouth with breakfast, with lunch, and with evening  meal. Patient taking differently: Take 2,001 mg by mouth with breakfast, with lunch, and with evening meal. 12/14/22   Modena Slater, DO  carvedilol (COREG) 25 MG tablet Take 1 tablet (25 mg total) by mouth 2 (two) times daily with a meal. 12/14/22   Modena Slater, DO  famotidine (PEPCID) 20 MG tablet Take 20 mg by mouth daily. 04/20/23   [provider]  ferric citrate (AURYXIA) 1 GM 210 MG(Fe) tablet Take 2 tablets (420 mg total) by mouth daily. 12/14/22   Modena Slater, DO  gabapentin (NEURONTIN) 100 MG capsule Take 1 capsule (100 mg total) by mouth 2 (two) times daily. 04/03/23   Dorcas Carrow, MD  Methoxy PEG-Epoetin Beta (MIRCERA IJ) Inject 1 Syringe into the skin every 14 (fourteen) days. 03/21/23 03/19/24  [provider]  multivitamin (RENA-VIT) TABS tablet Take 1 tablet by mouth daily. 11/22/22   [provider]  nitroGLYCERIN (NITROSTAT) 0.4 MG SL tablet Place 0.4 mg under the tongue every 5 (five) minutes as needed for chest pain. Patient not taking: Reported on 05/09/2023 12/19/22   [provider]  olmesartan (BENICAR) 20 MG tablet Take 1 tablet (20 mg total) by mouth daily. 12/14/22   Modena Slater, DO  pantoprazole (PROTONIX) 20 MG tablet Take 20 mg by mouth daily. 04/19/23   [provider]      Allergies    Imdur [isosorbide nitrate]  Review of Systems   Review of Systems  Constitutional:  Negative for activity change, appetite change and fever.  HENT:  Negative for congestion and sinus pressure.   Respiratory:  Positive for chest tightness and shortness of breath.   Cardiovascular:  Positive for chest pain.  Gastrointestinal:  Negative for abdominal pain, nausea and vomiting.  Genitourinary:  Negative for dysuria and hematuria.  Musculoskeletal:  Negative for arthralgias and myalgias.  Skin:  Negative for rash.  Neurological:  Negative for dizziness, weakness and headaches.   all other systems are negative except as noted in the HPI and  PMH.    Physical Exam Updated Vital Signs BP (!) 155/84   Pulse 80   Temp 97.6 F (36.4 C) (Oral)   Resp 18   SpO2 97%  Physical Exam Vitals and nursing note reviewed.  Constitutional:      General: He is not in acute distress.    Appearance: He is well-developed.  HENT:     Head: Normocephalic and atraumatic.     Mouth/Throat:     Pharynx: No oropharyngeal exudate.  Eyes:     Conjunctiva/sclera: Conjunctivae normal.     Pupils: Pupils are equal, round, and reactive to light.  Neck:     Comments: No meningismus. Cardiovascular:     Rate and Rhythm: Normal rate and regular rhythm.     Heart sounds: Normal heart sounds. No murmur heard. Pulmonary:     Effort: Pulmonary effort is normal. No respiratory distress.     Breath sounds: Normal breath sounds.  Chest:     Chest wall: Tenderness present.  Abdominal:     Palpations: Abdomen is soft.     Tenderness: There is no abdominal tenderness. There is no guarding or rebound.     Comments: No abdominal tenderness  Musculoskeletal:        General: No tenderness. Normal range of motion.     Cervical back: Normal range of motion and neck supple.     Comments: Right upper extremity fistula with thrill and bruit  Skin:    General: Skin is warm.  Neurological:     Mental Status: He is alert and oriented to person, place, and time.     Cranial Nerves: No cranial nerve deficit.     Motor: No abnormal muscle tone.     Coordination: Coordination normal.     Comments:  5/5 strength throughout. CN 2-12 intact.Equal grip strength.   Psychiatric:        Behavior: Behavior normal.     ED Results / Procedures / Treatments   Labs (all labs ordered are listed, but only abnormal results are displayed) Labs Reviewed  BASIC METABOLIC PANEL - Abnormal; Notable for the following components:      Result Value   Chloride 96 (*)    Glucose, Bld 130 (*)    BUN 42 (*)    Creatinine, Ser 13.43 (*)    GFR, Estimated 4 (*)    All other  components within normal limits  CBC - Abnormal; Notable for the following components:   RBC 3.62 (*)    Hemoglobin 11.4 (*)    HCT 34.8 (*)    RDW 17.3 (*)    Platelets 131 (*)    nRBC 0.3 (*)    All other components within normal limits  HEPATIC FUNCTION PANEL - Abnormal; Notable for the following components:   Albumin 3.4 (*)    All other components within normal limits  TROPONIN I (HIGH  SENSITIVITY) - Abnormal; Notable for the following components:   Troponin I (High Sensitivity) 113 (*)    All other components within normal limits  TROPONIN I (HIGH SENSITIVITY) - Abnormal; Notable for the following components:   Troponin I (High Sensitivity) 133 (*)    All other components within normal limits  TROPONIN I (HIGH SENSITIVITY) - Abnormal; Notable for the following components:   Troponin I (High Sensitivity) 181 (*)    All other components within normal limits  TROPONIN I (HIGH SENSITIVITY) - Abnormal; Notable for the following components:   Troponin I (High Sensitivity) 251 (*)    All other components within normal limits  LIPASE, BLOOD  TROPONIN I (HIGH SENSITIVITY)    EKG None  Radiology CT Angio Chest/Abd/Pel for Dissection W and/or Wo Contrast  Result Date: 06/01/2023 CLINICAL DATA:  Acute aortic syndrome suspected. Sudden onset chest pain for 30 minutes. Dialysis yesterday. EXAM: CT ANGIOGRAPHY CHEST, ABDOMEN AND PELVIS TECHNIQUE: Non-contrast CT of the chest was initially obtained. Multidetector CT imaging through the chest, abdomen and pelvis was performed using the standard protocol during bolus administration of intravenous contrast. Multiplanar reconstructed images and MIPs were obtained and reviewed to evaluate the vascular anatomy. RADIATION DOSE REDUCTION: This exam was performed according to the departmental dose-optimization program which includes automated exposure control, adjustment of the mA and/or kV according to patient size and/or use of iterative  reconstruction technique. CONTRAST:  OMNIPAQUE IOHEXOL 350 MG/ML SOLN COMPARISON:  Chest radiograph 06/01/2023. CT abdomen and pelvis 12/22/2022. CT chest 12/21/2022 FINDINGS: CTA CHEST FINDINGS Cardiovascular: Unenhanced images of the chest demonstrate calcification of the aorta and coronary arteries. No intramural hematoma. Images obtained during the arterial phase after intravenous contrast material administration demonstrate normal caliber thoracic aorta. No aortic dissection allowing for motion artifact. Great vessel origins are patent. Central pulmonary arteries are well opacified. No evidence of significant pulmonary embolus. Cardiac enlargement. No pericardial effusions. Mediastinum/Nodes: No enlarged mediastinal, hilar, or axillary lymph nodes. Thyroid gland, trachea, and esophagus demonstrate no significant findings. Lungs/Pleura: Motion artifact limits examination. There appears to be mosaic attenuation pattern to the lung bases likely representing edema. No pleural effusions. No pneumothorax. Musculoskeletal: No acute bony abnormalities. Review of the MIP images confirms the above findings. CTA ABDOMEN AND PELVIS FINDINGS VASCULAR Aorta: Normal caliber aorta without aneurysm, dissection, vasculitis or significant stenosis. Diffuse aortic calcification. Celiac: Celiac axis arises from the superior mesenteric artery and is patent. No evidence of aneurysm or dissection or large vessel occlusion. SMA: Patent without evidence of aneurysm, dissection, vasculitis or significant stenosis. Renals: Both renal arteries are patent without evidence of aneurysm, dissection, vasculitis, fibromuscular dysplasia or significant stenosis. IMA: Patent without evidence of aneurysm, dissection, vasculitis or significant stenosis. Inflow: Patent without evidence of aneurysm, dissection, vasculitis or significant stenosis. Veins: No obvious venous abnormality within the limitations of this arterial phase study. Review of  the MIP images confirms the above findings. NON-VASCULAR Hepatobiliary: No focal liver abnormality is seen. No gallstones, gallbladder wall thickening, or biliary dilatation. Pancreas: Unremarkable. No pancreatic ductal dilatation or surrounding inflammatory changes. Spleen: Normal in size without focal abnormality. Adrenals/Urinary Tract: No adrenal gland nodules. Kidneys are symmetrical. No hydronephrosis or hydroureter. 2 mm stone in the lower pole left kidney. Bladder is normal. Stomach/Bowel: Stomach is within normal limits. Appendix appears normal. No evidence of bowel wall thickening, distention, or inflammatory changes. Lymphatic: No significant lymphadenopathy. Reproductive: Prostate is unremarkable. Other: No abdominal wall hernia or abnormality. No abdominopelvic ascites. Musculoskeletal: Degenerative changes in the  spine. No acute bony abnormalities. Review of the MIP images confirms the above findings. IMPRESSION: 1. Aortic atherosclerosis. No evidence of aneurysm or dissection involving the thoracic or abdominal aorta. 2. Mosaic attenuation pattern to the lung bases suggesting mild edema. 3. Cardiac enlargement. 4. Nonobstructing stone in the lower pole left kidney. No ureteral stone or obstruction. 5. No bowel obstruction or inflammation. Electronically Signed   By: Burman Nieves M.D.   On: 06/01/2023 03:46   DG Chest 2 View  Result Date: 06/01/2023 CLINICAL DATA:  Chest pain. EXAM: CHEST - 2 VIEW COMPARISON:  May 09, 2023 FINDINGS: The cardiac silhouette is mildly enlarged and unchanged in size. There is marked severity calcification of the aortic arch. Low lung volumes are seen without evidence of focal consolidation, pleural effusion or pneumothorax. The visualized skeletal structures are unremarkable. IMPRESSION: Low lung volumes without evidence of acute or active cardiopulmonary disease. Electronically Signed   By: Aram Candela M.D.   On: 06/01/2023 00:48     Procedures Procedures    Medications Ordered in ED Medications  alum & mag hydroxide-simeth (MAALOX/MYLANTA) 200-200-20 MG/5ML suspension 30 mL (has no administration in time range)    ED Course/ Medical Decision Making/ A&P                                 Medical Decision Making Amount and/or Complexity of Data Reviewed Independent Historian: EMS Labs: ordered. Decision-making details documented in ED Course. Radiology: ordered and independent interpretation performed. Decision-making details documented in ED Course. ECG/medicine tests: ordered and independent interpretation performed. Decision-making details documented in ED Course.  Risk OTC drugs. Prescription drug management.   ESRD patient here with central chest pain that onset at rest.  EKG by EMS shows unchanged left bundle branch block.  On initial arrival here EKG shows a new right bundle branch block and query lead placement.  Will repeat.  previous cardiac catheterization on 12/13/2022 that demonstrated mild to moderate CAD with 25% stenosis of distal left main to proximal LAD, 50% proximal LAD, 25% ostial circumflex stenosis, but otherwise no significant stenosis.  He has had EF as well as 20 to 25% in 2021 however has had normalization of this since 2023.   Repeat EKG shows left bundle branch block similar to previous  Troponin minimally elevated but flat, similar to previous.  Previous cardiology evaluation that his chest pain was noncardiac in origin.  Normal lipase and LFTs.  CTA negative for aortic aneurysm or dissection. Does show mild edema of lungs.  BP has improved with meds. Tolerating PO without further vomiting.  Favor GI etiology of chest pain Troponin elevated but similar to previous range.   Has trended up 113, 133, 181. Suspect likely 2/2 ESRD as cardiology opined last month.   Resting comfortably on recheck. Tolerating PO. Will check one additional troponin to insure no significant rise.  Will likely be stable for outpatient followup with continuation of PPI, avoiding alcohol, caffeine, NSAID medications, spicy foods.   Dr. Jeraldine Loots to assume care at shift change.        Final Clinical Impression(s) / ED Diagnoses Final diagnoses:  Atypical chest pain  Nausea and vomiting, unspecified vomiting type    Rx / DC Orders ED Discharge Orders     None         Jerrik Housholder, Jeannett Senior, MD 06/01/23 [phone]

## 2023-06-01 NOTE — ED Provider Notes (Signed)
8:22 AM Care of the patient assumed at signout.  Patient is sleeping, in no distress.  Second troponin is elevated, though within the patient's prior range, and given his history of end-stage renal disease, absence of other overt evidence for ACS, patient discharged in stable condition.   Gerhard Munch, MD 06/01/23 [phone]

## 2023-06-12 ENCOUNTER — Ambulatory Visit: Payer: Medicare Other | Admitting: Podiatry

## 2023-06-12 ENCOUNTER — Ambulatory Visit: Payer: Medicare Other | Attending: Physician Assistant | Admitting: Physician Assistant

## 2023-06-12 NOTE — Progress Notes (Deleted)
  Cardiology Office Note:  .   Date:  06/12/2023  ID:  Corey Park, DOB [birth year], MRN [id number] PCP: [name], NP  [facility] Providers Cardiologist:  Donato Schultz, MD {  History of Present Illness: .   Corey Park is a 52 y.o. male here for follow-up of systolic HF 35%.  COVID-19 hospitalization 08/03/2019, ESRD on HD, chronic systolic heart failure, hypertensive cardiomyopathy, moderate secondary pulmonary hypertension, blindness, DM2, and hypertension here for follow-up appointment.  June 2021 he had cellulitis of the peritoneum treated with antibiotics.  He presented for follow-up with his stepfather.  His son acts as a caretaker and helps him with his medications.  He reports compliance with dialysis Monday Wednesday Friday and goes at 4:30 PM.  Does have history of the hiccups and is treated with Thorazine.  He had not been taking his Imdur and occasionally skips his carvedilol.  Did endorse headache and asked about supplements to help with energy (vitamin C vitamin D for example) C.  Encouraged him to take prescribed medicines that have been proven to decrease mortality in setting of heart failure.  Today, he***  ROS: Pertinent ROS in HPI this time ER patient*  Studies Reviewed: [name]        ECHO 12/2020:    1. Left ventricular ejection fraction, by estimation, is 40 to 45%. Left  ventricular ejection fraction by PLAX is 45 %. The left ventricle has  mildly decreased function. The left ventricle has no regional wall motion  abnormalities. There is moderate  left ventricular hypertrophy. Indeterminate diastolic filling due to E-A  fusion.   2. Right ventricular systolic function is normal. The right ventricular  size is normal. Tricuspid regurgitation signal is inadequate for assessing  PA pressure.   3. The mitral valve is grossly normal. Trivial mitral valve  regurgitation.   4. The aortic valve is tricuspid. Aortic valve regurgitation is not   visualized.   5. The inferior vena cava is normal in size with <50% respiratory  variability, suggesting right atrial pressure of 8 mmHg       Physical Exam:   VS:  There were no vitals taken for this visit.   Wt Readings from Last 3 Encounters:  06/01/23 216 lb 0.8 oz (98 kg)  05/11/23 213 lb 13.5 oz (97 kg)  04/03/23 212 lb 4.9 oz (96.3 kg)    GEN: Well nourished, well developed in no acute distress NECK: No JVD; No carotid bruits CARDIAC: ***RRR, no murmurs, rubs, gallops RESPIRATORY:  Clear to auscultation without rales, wheezing or rhonchi  ABDOMEN: Soft, non-tender, non-distended EXTREMITIES:  No edema; No deformity   ASSESSMENT AND PLAN: .   Chronic systolic heart failure Acute renal failure on HD    {Are you ordering a CV Procedure (e.g. stress test, cath, DCCV, TEE, etc)?   Press F2        :[id number]}  Dispo: ***  Signed, Sharlene Dory, PA-C

## 2023-06-25 ENCOUNTER — Other Ambulatory Visit: Payer: Self-pay

## 2023-06-25 ENCOUNTER — Inpatient Hospital Stay (HOSPITAL_COMMUNITY)
Admission: EM | Admit: 2023-06-25 | Discharge: 2023-07-08 | DRG: 233 | Disposition: A | Discharge status: Home Health | Payer: Medicare Other | Attending: Thoracic Surgery (Cardiothoracic Vascular Surgery) | Admitting: Thoracic Surgery (Cardiothoracic Vascular Surgery)

## 2023-06-25 ENCOUNTER — Inpatient Hospital Stay (HOSPITAL_COMMUNITY)
Admission: EM | Disposition: A | Discharge status: Home Health | Payer: Self-pay | Source: Home / Self Care | Attending: Thoracic Surgery (Cardiothoracic Vascular Surgery)

## 2023-06-25 ENCOUNTER — Inpatient Hospital Stay (HOSPITAL_COMMUNITY): Payer: Medicare Other

## 2023-06-25 ENCOUNTER — Encounter (HOSPITAL_COMMUNITY): Payer: Self-pay

## 2023-06-25 ENCOUNTER — Emergency Department (HOSPITAL_COMMUNITY): Payer: Medicare Other

## 2023-06-25 DIAGNOSIS — I452 Bifascicular block: Secondary | ICD-10-CM | POA: Diagnosis present

## 2023-06-25 DIAGNOSIS — D631 Anemia in chronic kidney disease: Secondary | ICD-10-CM | POA: Diagnosis present

## 2023-06-25 DIAGNOSIS — Z7982 Long term (current) use of aspirin: Secondary | ICD-10-CM

## 2023-06-25 DIAGNOSIS — R7989 Other specified abnormal findings of blood chemistry: Secondary | ICD-10-CM | POA: Diagnosis not present

## 2023-06-25 DIAGNOSIS — I25700 Atherosclerosis of coronary artery bypass graft(s), unspecified, with unstable angina pectoris: Secondary | ICD-10-CM | POA: Diagnosis not present

## 2023-06-25 DIAGNOSIS — D696 Thrombocytopenia, unspecified: Secondary | ICD-10-CM | POA: Diagnosis present

## 2023-06-25 DIAGNOSIS — I214 Non-ST elevation (NSTEMI) myocardial infarction: Secondary | ICD-10-CM

## 2023-06-25 DIAGNOSIS — E875 Hyperkalemia: Secondary | ICD-10-CM | POA: Diagnosis not present

## 2023-06-25 DIAGNOSIS — Z8616 Personal history of COVID-19: Secondary | ICD-10-CM | POA: Diagnosis not present

## 2023-06-25 DIAGNOSIS — E8779 Other fluid overload: Secondary | ICD-10-CM | POA: Diagnosis not present

## 2023-06-25 DIAGNOSIS — N2581 Secondary hyperparathyroidism of renal origin: Secondary | ICD-10-CM | POA: Diagnosis present

## 2023-06-25 DIAGNOSIS — E1151 Type 2 diabetes mellitus with diabetic peripheral angiopathy without gangrene: Secondary | ICD-10-CM | POA: Diagnosis present

## 2023-06-25 DIAGNOSIS — R739 Hyperglycemia, unspecified: Secondary | ICD-10-CM

## 2023-06-25 DIAGNOSIS — Z8249 Family history of ischemic heart disease and other diseases of the circulatory system: Secondary | ICD-10-CM

## 2023-06-25 DIAGNOSIS — I420 Dilated cardiomyopathy: Secondary | ICD-10-CM | POA: Diagnosis present

## 2023-06-25 DIAGNOSIS — N186 End stage renal disease: Secondary | ICD-10-CM

## 2023-06-25 DIAGNOSIS — R7303 Prediabetes: Secondary | ICD-10-CM | POA: Diagnosis not present

## 2023-06-25 DIAGNOSIS — I10 Essential (primary) hypertension: Secondary | ICD-10-CM | POA: Diagnosis not present

## 2023-06-25 DIAGNOSIS — E1165 Type 2 diabetes mellitus with hyperglycemia: Secondary | ICD-10-CM | POA: Diagnosis not present

## 2023-06-25 DIAGNOSIS — N189 Chronic kidney disease, unspecified: Secondary | ICD-10-CM

## 2023-06-25 DIAGNOSIS — Z862 Personal history of diseases of the blood and blood-forming organs and certain disorders involving the immune mechanism: Secondary | ICD-10-CM

## 2023-06-25 DIAGNOSIS — H543 Unqualified visual loss, both eyes: Secondary | ICD-10-CM | POA: Diagnosis not present

## 2023-06-25 DIAGNOSIS — Z888 Allergy status to other drugs, medicaments and biological substances status: Secondary | ICD-10-CM

## 2023-06-25 DIAGNOSIS — J9811 Atelectasis: Secondary | ICD-10-CM | POA: Diagnosis present

## 2023-06-25 DIAGNOSIS — M898X9 Other specified disorders of bone, unspecified site: Secondary | ICD-10-CM | POA: Diagnosis present

## 2023-06-25 DIAGNOSIS — E1122 Type 2 diabetes mellitus with diabetic chronic kidney disease: Secondary | ICD-10-CM | POA: Diagnosis present

## 2023-06-25 DIAGNOSIS — I5021 Acute systolic (congestive) heart failure: Secondary | ICD-10-CM | POA: Diagnosis not present

## 2023-06-25 DIAGNOSIS — E119 Type 2 diabetes mellitus without complications: Secondary | ICD-10-CM

## 2023-06-25 DIAGNOSIS — I255 Ischemic cardiomyopathy: Secondary | ICD-10-CM | POA: Diagnosis present

## 2023-06-25 DIAGNOSIS — Z01810 Encounter for preprocedural cardiovascular examination: Secondary | ICD-10-CM | POA: Diagnosis not present

## 2023-06-25 DIAGNOSIS — H548 Legal blindness, as defined in USA: Secondary | ICD-10-CM | POA: Diagnosis present

## 2023-06-25 DIAGNOSIS — I132 Hypertensive heart and chronic kidney disease with heart failure and with stage 5 chronic kidney disease, or end stage renal disease: Secondary | ICD-10-CM | POA: Diagnosis present

## 2023-06-25 DIAGNOSIS — I5042 Chronic combined systolic (congestive) and diastolic (congestive) heart failure: Secondary | ICD-10-CM | POA: Diagnosis present

## 2023-06-25 DIAGNOSIS — J9601 Acute respiratory failure with hypoxia: Secondary | ICD-10-CM | POA: Diagnosis present

## 2023-06-25 DIAGNOSIS — Z951 Presence of aortocoronary bypass graft: Secondary | ICD-10-CM

## 2023-06-25 DIAGNOSIS — I739 Peripheral vascular disease, unspecified: Secondary | ICD-10-CM | POA: Diagnosis not present

## 2023-06-25 DIAGNOSIS — K5900 Constipation, unspecified: Secondary | ICD-10-CM | POA: Diagnosis present

## 2023-06-25 DIAGNOSIS — Z992 Dependence on renal dialysis: Secondary | ICD-10-CM

## 2023-06-25 DIAGNOSIS — I2510 Atherosclerotic heart disease of native coronary artery without angina pectoris: Secondary | ICD-10-CM | POA: Diagnosis present

## 2023-06-25 DIAGNOSIS — K219 Gastro-esophageal reflux disease without esophagitis: Secondary | ICD-10-CM | POA: Diagnosis present

## 2023-06-25 DIAGNOSIS — Z79899 Other long term (current) drug therapy: Secondary | ICD-10-CM

## 2023-06-25 DIAGNOSIS — J90 Pleural effusion, not elsewhere classified: Secondary | ICD-10-CM | POA: Diagnosis not present

## 2023-06-25 DIAGNOSIS — E7800 Pure hypercholesterolemia, unspecified: Secondary | ICD-10-CM | POA: Diagnosis present

## 2023-06-25 DIAGNOSIS — R079 Chest pain, unspecified: Secondary | ICD-10-CM | POA: Diagnosis not present

## 2023-06-25 HISTORY — DX: Non-ST elevation (NSTEMI) myocardial infarction: I21.4

## 2023-06-25 HISTORY — PX: LEFT HEART CATH AND CORONARY ANGIOGRAPHY: CATH118249

## 2023-06-25 LAB — I-STAT VENOUS BLOOD GAS, ED
Acid-Base Excess: 6 mmol/L — ABNORMAL HIGH (ref 0.0–2.0)
Bicarbonate: 31.3 mmol/L — ABNORMAL HIGH (ref 20.0–28.0)
Calcium, Ion: 1.01 mmol/L — ABNORMAL LOW (ref 1.15–1.40)
HCT: 39 % (ref 39.0–52.0)
Hemoglobin: 13.3 g/dL (ref 13.0–17.0)
O2 Saturation: 71 %
Potassium: 4.4 mmol/L (ref 3.5–5.1)
Sodium: 139 mmol/L (ref 135–145)
TCO2: 33 mmol/L — ABNORMAL HIGH (ref 22–32)
pCO2, Ven: 46.3 mm[Hg] (ref 44–60)
pH, Ven: 7.438 — ABNORMAL HIGH (ref 7.25–7.43)
pO2, Ven: 36 mm[Hg] (ref 32–45)

## 2023-06-25 LAB — CBC WITH DIFFERENTIAL/PLATELET
Abs Immature Granulocytes: 0.02 10*3/uL (ref 0.00–0.07)
Basophils Absolute: 0 10*3/uL (ref 0.0–0.1)
Basophils Relative: 0 %
Eosinophils Absolute: 0.3 10*3/uL (ref 0.0–0.5)
Eosinophils Relative: 4 %
HCT: 38.7 % — ABNORMAL LOW (ref 39.0–52.0)
Hemoglobin: 12.8 g/dL — ABNORMAL LOW (ref 13.0–17.0)
Immature Granulocytes: 0 %
Lymphocytes Relative: 19 %
Lymphs Abs: 1.8 10*3/uL (ref 0.7–4.0)
MCH: 32.2 pg (ref 26.0–34.0)
MCHC: 33.1 g/dL (ref 30.0–36.0)
MCV: 97.2 fL (ref 80.0–100.0)
Monocytes Absolute: 0.9 10*3/uL (ref 0.1–1.0)
Monocytes Relative: 9 %
Neutro Abs: 6.6 10*3/uL (ref 1.7–7.7)
Neutrophils Relative %: 68 %
Platelets: 177 10*3/uL (ref 150–400)
RBC: 3.98 MIL/uL — ABNORMAL LOW (ref 4.22–5.81)
RDW: 16.5 % — ABNORMAL HIGH (ref 11.5–15.5)
WBC: 9.6 10*3/uL (ref 4.0–10.5)
nRBC: 0.2 % (ref 0.0–0.2)

## 2023-06-25 LAB — GLUCOSE, CAPILLARY
Glucose-Capillary: 119 mg/dL — ABNORMAL HIGH (ref 70–99)
Glucose-Capillary: 142 mg/dL — ABNORMAL HIGH (ref 70–99)

## 2023-06-25 LAB — ECHOCARDIOGRAM COMPLETE
AR max vel: 2.25 cm2
AV Area VTI: 2.12 cm2
AV Area mean vel: 2.1 cm2
AV Mean grad: 5 mm[Hg]
AV Peak grad: 8.4 mm[Hg]
Ao pk vel: 1.45 m/s
Area-P 1/2: 4.57 cm2
Calc EF: 41.3 %
Height: 73 in
S' Lateral: 4.6 cm
Single Plane A2C EF: 42.7 %
Single Plane A4C EF: 38.5 %
Weight: 3456 [oz_av]

## 2023-06-25 LAB — CBG MONITORING, ED: Glucose-Capillary: 152 mg/dL — ABNORMAL HIGH (ref 70–99)

## 2023-06-25 LAB — BRAIN NATRIURETIC PEPTIDE
B Natriuretic Peptide: 620.3 pg/mL — ABNORMAL HIGH (ref 0.0–100.0)
B Natriuretic Peptide: 480 pg/mL — ABNORMAL HIGH (ref 0.0–100.0)

## 2023-06-25 LAB — COMPREHENSIVE METABOLIC PANEL
ALT: 15 U/L (ref 0–44)
AST: 25 U/L (ref 15–41)
Albumin: 3.6 g/dL (ref 3.5–5.0)
Alkaline Phosphatase: 79 U/L (ref 38–126)
Anion gap: 19 — ABNORMAL HIGH (ref 5–15)
BUN: 44 mg/dL — ABNORMAL HIGH (ref 6–20)
CO2: 24 mmol/L (ref 22–32)
Calcium: 9.3 mg/dL (ref 8.9–10.3)
Chloride: 97 mmol/L — ABNORMAL LOW (ref 98–111)
Creatinine, Ser: 14.45 mg/dL — ABNORMAL HIGH (ref 0.61–1.24)
GFR, Estimated: 4 mL/min — ABNORMAL LOW (ref >60)
Glucose, Bld: 156 mg/dL — ABNORMAL HIGH (ref 70–99)
Potassium: 4.5 mmol/L (ref 3.5–5.1)
Sodium: 140 mmol/L (ref 135–145)
Total Bilirubin: 0.7 mg/dL (ref <1.2)
Total Protein: 8.3 g/dL — ABNORMAL HIGH (ref 6.5–8.1)

## 2023-06-25 LAB — TROPONIN I (HIGH SENSITIVITY)
Troponin I (High Sensitivity): 860 ng/L (ref <18)
Troponin I (High Sensitivity): 4749 ng/L (ref <18)
Troponin I (High Sensitivity): 10982 ng/L (ref <18)

## 2023-06-25 LAB — BASIC METABOLIC PANEL
Anion gap: 17 — ABNORMAL HIGH (ref 5–15)
BUN: 45 mg/dL — ABNORMAL HIGH (ref 6–20)
CO2: 25 mmol/L (ref 22–32)
Calcium: 8.7 mg/dL — ABNORMAL LOW (ref 8.9–10.3)
Chloride: 98 mmol/L (ref 98–111)
Creatinine, Ser: 14.5 mg/dL — ABNORMAL HIGH (ref 0.61–1.24)
GFR, Estimated: 4 mL/min — ABNORMAL LOW (ref >60)
Glucose, Bld: 175 mg/dL — ABNORMAL HIGH (ref 70–99)
Potassium: 4.8 mmol/L (ref 3.5–5.1)
Sodium: 140 mmol/L (ref 135–145)

## 2023-06-25 LAB — I-STAT CHEM 8, ED
BUN: 44 mg/dL — ABNORMAL HIGH (ref 6–20)
Calcium, Ion: 1.01 mmol/L — ABNORMAL LOW (ref 1.15–1.40)
Chloride: 99 mmol/L (ref 98–111)
Creatinine, Ser: 15.4 mg/dL — ABNORMAL HIGH (ref 0.61–1.24)
Glucose, Bld: 155 mg/dL — ABNORMAL HIGH (ref 70–99)
HCT: 38 % — ABNORMAL LOW (ref 39.0–52.0)
Hemoglobin: 12.9 g/dL — ABNORMAL LOW (ref 13.0–17.0)
Potassium: 4.4 mmol/L (ref 3.5–5.1)
Sodium: 140 mmol/L (ref 135–145)
TCO2: 28 mmol/L (ref 22–32)

## 2023-06-25 LAB — MAGNESIUM: Magnesium: 2 mg/dL (ref 1.7–2.4)

## 2023-06-25 LAB — PROCALCITONIN: Procalcitonin: 0.4 ng/mL

## 2023-06-25 LAB — PHOSPHORUS: Phosphorus: 7.5 mg/dL — ABNORMAL HIGH (ref 2.5–4.6)

## 2023-06-25 SURGERY — LEFT HEART CATH AND CORONARY ANGIOGRAPHY
Anesthesia: LOCAL

## 2023-06-25 MED ORDER — LIDOCAINE HCL (PF) 1 % IJ SOLN
INTRAMUSCULAR | Status: DC | PRN
  Administered 2023-06-25: 2 mL

## 2023-06-25 MED ORDER — HEPARIN BOLUS VIA INFUSION
4000.0000 [IU] | Freq: Once | INTRAVENOUS | Status: AC
  Administered 2023-06-25: 4000 [IU] via INTRAVENOUS
  Filled 2023-06-25: qty 4000

## 2023-06-25 MED ORDER — ASPIRIN 325 MG PO TABS
325.0000 mg | ORAL_TABLET | Freq: Every day | ORAL | Status: DC
  Administered 2023-06-25 – 2023-06-27 (×3): 325 mg via ORAL
  Filled 2023-06-25 (×3): qty 1

## 2023-06-25 MED ORDER — FENTANYL CITRATE (PF) 100 MCG/2ML IJ SOLN
INTRAMUSCULAR | Status: AC
  Filled 2023-06-25: qty 2

## 2023-06-25 MED ORDER — HEPARIN (PORCINE) IN NACL 1000-0.9 UT/500ML-% IV SOLN
INTRAVENOUS | Status: DC | PRN
  Administered 2023-06-25 (×2): 500 mL

## 2023-06-25 MED ORDER — SODIUM CHLORIDE 0.9 % IV SOLN
1.0000 g | Freq: Once | INTRAVENOUS | Status: AC
  Administered 2023-06-25: 1 g via INTRAVENOUS
  Filled 2023-06-25: qty 10

## 2023-06-25 MED ORDER — SODIUM CHLORIDE 0.9 % IV SOLN: 250.0000 mL | INTRAVENOUS | Status: AC | PRN

## 2023-06-25 MED ORDER — SODIUM CHLORIDE 0.9 % IV SOLN
500.0000 mg | Freq: Once | INTRAVENOUS | Status: AC
  Administered 2023-06-25: 500 mg via INTRAVENOUS
  Filled 2023-06-25: qty 5

## 2023-06-25 MED ORDER — HEPARIN SODIUM (PORCINE) 1000 UNIT/ML IJ SOLN
INTRAMUSCULAR | Status: AC
  Filled 2023-06-25: qty 10

## 2023-06-25 MED ORDER — FENTANYL CITRATE (PF) 100 MCG/2ML IJ SOLN
INTRAMUSCULAR | Status: DC | PRN
  Administered 2023-06-25: 25 ug via INTRAVENOUS

## 2023-06-25 MED ORDER — SODIUM CHLORIDE 0.9% FLUSH: 3.0000 mL | INTRAVENOUS | Status: DC | PRN

## 2023-06-25 MED ORDER — HYDRALAZINE HCL 20 MG/ML IJ SOLN
10.0000 mg | INTRAMUSCULAR | Status: AC | PRN
Start: 2023-06-25 — End: 2023-06-25

## 2023-06-25 MED ORDER — SODIUM CHLORIDE 0.9 % IV SOLN: 500.0000 mg | INTRAVENOUS | Status: DC

## 2023-06-25 MED ORDER — NITROGLYCERIN IN D5W 200-5 MCG/ML-% IV SOLN
0.0000 ug/min | INTRAVENOUS | Status: DC
  Administered 2023-06-25: 5 ug/min via INTRAVENOUS
  Administered 2023-06-26: 15 ug/min via INTRAVENOUS
  Administered 2023-06-26: 10 ug/min via INTRAVENOUS
  Filled 2023-06-25: qty 250

## 2023-06-25 MED ORDER — PANTOPRAZOLE SODIUM 40 MG IV SOLR
40.0000 mg | Freq: Every day | INTRAVENOUS | Status: DC
  Administered 2023-06-25 – 2023-06-27 (×3): 40 mg via INTRAVENOUS
  Filled 2023-06-25 (×3): qty 10

## 2023-06-25 MED ORDER — MIDAZOLAM HCL 2 MG/2ML IJ SOLN
INTRAMUSCULAR | Status: DC | PRN
  Administered 2023-06-25: 1 mg via INTRAVENOUS

## 2023-06-25 MED ORDER — CARVEDILOL 12.5 MG PO TABS
12.5000 mg | ORAL_TABLET | Freq: Two times a day (BID) | ORAL | Status: DC
  Administered 2023-06-25 – 2023-06-27 (×5): 12.5 mg via ORAL
  Filled 2023-06-25 (×5): qty 1

## 2023-06-25 MED ORDER — HEPARIN (PORCINE) 25000 UT/250ML-% IV SOLN
1400.0000 [IU]/h | INTRAVENOUS | Status: DC
Start: 2023-06-25 — End: 2023-06-28
  Administered 2023-06-25 (×2): 1200 [IU]/h via INTRAVENOUS
  Administered 2023-06-26: 1400 [IU]/h via INTRAVENOUS
  Filled 2023-06-25 (×3): qty 250

## 2023-06-25 MED ORDER — INSULIN ASPART 100 UNIT/ML IJ SOLN
0.0000 [IU] | Freq: Four times a day (QID) | INTRAMUSCULAR | Status: DC
  Administered 2023-06-25 – 2023-06-27 (×3): 1 [IU] via SUBCUTANEOUS

## 2023-06-25 MED ORDER — MIDAZOLAM HCL 5 MG/5ML IJ SOLN
INTRAMUSCULAR | Status: AC
  Filled 2023-06-25: qty 5

## 2023-06-25 MED ORDER — FAMOTIDINE 20 MG PO TABS
20.0000 mg | ORAL_TABLET | Freq: Every day | ORAL | Status: DC
  Administered 2023-06-26 – 2023-06-27 (×2): 20 mg via ORAL
  Filled 2023-06-25 (×2): qty 1

## 2023-06-25 MED ORDER — ALUM & MAG HYDROXIDE-SIMETH 200-200-20 MG/5ML PO SUSP
30.0000 mL | ORAL | Status: DC | PRN
  Administered 2023-06-26 – 2023-06-27 (×2): 30 mL via ORAL
  Filled 2023-06-25 (×4): qty 30

## 2023-06-25 MED ORDER — SODIUM CHLORIDE 0.9 % IV SOLN: 1.0000 g | INTRAVENOUS | Status: DC

## 2023-06-25 MED ORDER — IRBESARTAN 150 MG PO TABS
150.0000 mg | ORAL_TABLET | Freq: Every day | ORAL | Status: DC
  Administered 2023-06-25 – 2023-06-27 (×3): 150 mg via ORAL
  Filled 2023-06-25 (×3): qty 1

## 2023-06-25 MED ORDER — HEPARIN (PORCINE) 25000 UT/250ML-% IV SOLN
1200.0000 [IU]/h | INTRAVENOUS | Status: DC
  Administered 2023-06-25: 1200 [IU]/h via INTRAVENOUS
  Filled 2023-06-25: qty 250

## 2023-06-25 MED ORDER — ACETAMINOPHEN 650 MG RE SUPP: 650.0000 mg | Freq: Four times a day (QID) | RECTAL | Status: DC | PRN

## 2023-06-25 MED ORDER — HEPARIN SODIUM (PORCINE) 1000 UNIT/ML IJ SOLN
INTRAMUSCULAR | Status: DC | PRN
  Administered 2023-06-25: 5000 [IU] via INTRAVENOUS

## 2023-06-25 MED ORDER — CALCIUM ACETATE (PHOS BINDER) 667 MG PO CAPS
2001.0000 mg | ORAL_CAPSULE | Freq: Three times a day (TID) | ORAL | Status: DC
  Administered 2023-06-25 – 2023-07-08 (×23): 2001 mg via ORAL
  Filled 2023-06-25 (×25): qty 3

## 2023-06-25 MED ORDER — LABETALOL HCL 5 MG/ML IV SOLN
10.0000 mg | INTRAVENOUS | Status: AC | PRN
Start: 2023-06-25 — End: 2023-06-25

## 2023-06-25 MED ORDER — ACETAMINOPHEN 325 MG PO TABS
650.0000 mg | ORAL_TABLET | Freq: Four times a day (QID) | ORAL | Status: DC | PRN
  Administered 2023-06-26 – 2023-06-27 (×6): 650 mg via ORAL
  Filled 2023-06-25 (×7): qty 2

## 2023-06-25 MED ORDER — IOHEXOL 350 MG/ML SOLN
INTRAVENOUS | Status: DC | PRN
  Administered 2023-06-25: 45 mL

## 2023-06-25 MED ORDER — ATORVASTATIN CALCIUM 80 MG PO TABS
80.0000 mg | ORAL_TABLET | Freq: Every day | ORAL | Status: DC
  Administered 2023-06-25 – 2023-07-08 (×13): 80 mg via ORAL
  Filled 2023-06-25: qty 1
  Filled 2023-06-25: qty 2
  Filled 2023-06-25 (×11): qty 1

## 2023-06-25 MED ORDER — LIDOCAINE HCL (PF) 1 % IJ SOLN
INTRAMUSCULAR | Status: AC
  Filled 2023-06-25: qty 30

## 2023-06-25 MED ORDER — VERAPAMIL HCL 2.5 MG/ML IV SOLN
INTRAVENOUS | Status: AC
  Filled 2023-06-25: qty 2

## 2023-06-25 MED ORDER — ONDANSETRON HCL 4 MG/2ML IJ SOLN
4.0000 mg | Freq: Four times a day (QID) | INTRAMUSCULAR | Status: DC | PRN
  Administered 2023-06-26 – 2023-06-27 (×5): 4 mg via INTRAVENOUS
  Filled 2023-06-25 (×6): qty 2

## 2023-06-25 MED ORDER — VERAPAMIL HCL 2.5 MG/ML IV SOLN
INTRAVENOUS | Status: DC | PRN
  Administered 2023-06-25: 10 mL via INTRA_ARTERIAL

## 2023-06-25 SURGICAL SUPPLY — 9 items
CATH 5FR JL3.5 JR4 ANG PIG MP (CATHETERS) IMPLANT
DEVICE RAD COMP TR BAND LRG (VASCULAR PRODUCTS) IMPLANT
GLIDESHEATH SLEND SS 6F .021 (SHEATH) IMPLANT
GUIDEWIRE INQWIRE 1.5J.035X260 (WIRE) IMPLANT
INQWIRE 1.5J .035X260CM (WIRE) ×1
KIT HEART LEFT (KITS) IMPLANT
PACK CARDIAC CATHETERIZATION (CUSTOM PROCEDURE TRAY) ×1 IMPLANT
TRANSDUCER W/STOPCOCK (MISCELLANEOUS) IMPLANT
TUBING CIL FLEX 10 FLL-RA (TUBING) IMPLANT

## 2023-06-25 NOTE — H&P (View-Only) (Signed)
Cardiology Consultation   Patient ID: Corey Park MRN: [id number]; DOB: 01/31/1971  Admit date: 06/25/2023 Date of Consult: 06/25/2023  PCP:  [name], NP   [facility] Providers Cardiologist:  Donato Schultz, MD   {   Patient Profile:   Corey Park is a 52 y.o. male who is being seen 06/25/2023 for the evaluation of chest pain at the request of Marinda Elk, MD.  History of Present Illness:   Corey Park is a 51 y/o male w/hypertension, type 2 diabetes mellitus, ESRD on HD, moderate CAD (cath 11/2022), admitted with chest pain, NSTEMI  Patient presented to [facility], ER on 12:29 PM with complaints of chest pain.  Chest pain has been ongoing with exertion for last 3-4 days, got worse to 10/10 prompting ER visit.  Workup in the ER showed EKG with inferolateral ST depression more prominent compared to prior, high sensitive troponin up to 4700, blood pressure control.  Chest x-ray showed bilateral airspace disease, but denies any fever, chills.  He has had nonproductive cough.  At baseline, he  makes plans for mornings, sedentary job.  He is non-smoker.  He gets dialysis through right upper arm AV fistula Monday Wednesday Friday, schedule changed due to holidays.  Next Alysis is due on Tuesday, 06/26/2023.  His last heart catheterization was in 11/2022 that showed moderate nonobstructive CAD, including moderate left main disease, with IVUS CAC of 8.1 mm.  Reportedly, patient had large hematoma after procedure performed through femoral access.  Therefore, patient prefers radial access, if possible.   Past Medical History:  Diagnosis Date   Allergy, unspecified, initial encounter 08/25/2019   Asthma    as a child   Cellulitis, perineum 11/26/2019   CHF (congestive heart failure) (HCC) 2021   Complication of vascular dialysis catheter 08/25/2019   COVID-19 virus infection 07/27/2019   Diabetes mellitus without complication (HCC)    type 2   Dilated  cardiomyopathy (HCC) 07/03/2018   ESRD on hemodialysis (HCC) 06/17/2018   MWF at [facility]   Fe deficiency anemia 12/23/2018   Hypertension    Legally blind    B/L   Pneumonia 2021   Type 2 diabetes mellitus with diabetic peripheral angiopathy without gangrene (HCC) 08/25/2019   Unspecified protein-calorie malnutrition (HCC) 08/25/2019    Past Surgical History:  Procedure Laterality Date   BASCILIC VEIN TRANSPOSITION Right 10/16/2019   Procedure: Basilic Vein Transposition Right Arm;  Surgeon: Nada Libman, MD;  Location: [facility] OR;  Service: Vascular;  Laterality: Right;   BASCILIC VEIN TRANSPOSITION Right 01/29/2020   Procedure: RIGHT ARM SECOND STAGE BASCILIC VEIN TRANSPOSITION;  Surgeon: Nada Libman, MD;  Location: [facility] OR;  Service: Vascular;  Laterality: Right;   BIOPSY  12/22/2018   Procedure: BIOPSY;  Surgeon: Carman Ching, MD;  Location: [facility] ENDOSCOPY;  Service: Endoscopy;;   CORONARY ULTRASOUND/IVUS N/A 12/13/2022   Procedure: Coronary Ultrasound/IVUS;  Surgeon: Corky Crafts, MD;  Location: [facility] INVASIVE CV LAB;  Service: Cardiovascular;  Laterality: N/A;   ESOPHAGOGASTRODUODENOSCOPY N/A 12/22/2018   Procedure: ESOPHAGOGASTRODUODENOSCOPY (EGD);  Surgeon: Carman Ching, MD;  Location: [facility] ENDOSCOPY;  Service: Endoscopy;  Laterality: N/A;   IR FLUORO GUIDE CV LINE RIGHT  07/29/2019   IR US GUIDE VASC ACCESS RIGHT  07/29/2019   LEFT HEART CATH AND CORONARY ANGIOGRAPHY N/A 12/13/2022   Procedure: LEFT HEART CATH AND CORONARY ANGIOGRAPHY;  Surgeon: Corky Crafts, MD;  Location: [facility] INVASIVE CV LAB;  Service: Cardiovascular;  Laterality: N/A;  TRANSESOPHAGEAL ECHOCARDIOGRAM (CATH LAB) N/A 05/10/2023   Procedure: TRANSESOPHAGEAL ECHOCARDIOGRAM;  Surgeon: Sande Rives, MD;  Location: [facility] INVASIVE CV LAB;  Service: Cardiovascular;  Laterality: N/A;     Home Medications:  Prior to Admission medications   Medication Sig Start Date End Date Taking? Authorizing Provider   amLODipine (NORVASC) 5 MG tablet Take 1 tablet (5 mg total) by mouth daily. 05/12/23 10/09/23 Yes Jerald Kief, MD  atorvastatin (LIPITOR) 80 MG tablet Take 1 tablet (80 mg total) by mouth daily. 12/15/22   Modena Slater, DO  calcium acetate (PHOSLO) 667 MG capsule Take 2 capsules (1,334 mg total) by mouth with breakfast, with lunch, and with evening meal. Patient taking differently: Take 2,001 mg by mouth with breakfast, with lunch, and with evening meal. 12/14/22   Modena Slater, DO  carvedilol (COREG) 25 MG tablet Take 1 tablet (25 mg total) by mouth 2 (two) times daily with a meal. 12/14/22   Modena Slater, DO  famotidine (PEPCID) 20 MG tablet Take 20 mg by mouth daily. 04/20/23   [provider]  ferric citrate (AURYXIA) 1 GM 210 MG(Fe) tablet Take 2 tablets (420 mg total) by mouth daily. 12/14/22   Modena Slater, DO  gabapentin (NEURONTIN) 100 MG capsule Take 1 capsule (100 mg total) by mouth 2 (two) times daily. 04/03/23   Dorcas Carrow, MD  multivitamin (RENA-VIT) TABS tablet Take 1 tablet by mouth daily. 11/22/22   [provider]  nitroGLYCERIN (NITROSTAT) 0.4 MG SL tablet Place 0.4 mg under the tongue every 5 (five) minutes as needed for chest pain. Patient not taking: Reported on 05/09/2023 12/19/22   [provider]  olmesartan (BENICAR) 20 MG tablet Take 1 tablet (20 mg total) by mouth daily. 12/14/22   Modena Slater, DO  ondansetron (ZOFRAN-ODT) 4 MG disintegrating tablet Take by mouth. 05/25/23   [provider]  pantoprazole (PROTONIX) 20 MG tablet Take 20 mg by mouth daily. 04/19/23   [provider]    Inpatient Medications: Scheduled Meds:  aspirin  325 mg Oral Daily   atorvastatin  80 mg Oral Daily   calcium acetate  2,001 mg Oral TID with meals   carvedilol  12.5 mg Oral BID WC   famotidine  20 mg Oral Daily   insulin aspart  0-6 Units Subcutaneous Q6H   irbesartan  150 mg Oral Daily   pantoprazole (PROTONIX) IV  40 mg Intravenous Daily    Continuous Infusions:  heparin 1,200 Units/hr (06/25/23 0304)   nitroGLYCERIN 5 mcg/min (06/25/23 0657)   PRN Meds: acetaminophen **OR** acetaminophen, ondansetron (ZOFRAN) IV  Allergies:    Allergies  Allergen Reactions   Imdur [Isosorbide Nitrate] Other (See Comments)    Endorsed headache in the past - willing to retry    Social History:   Social History   Socioeconomic History   Marital status: Single    Spouse name: Not on file   Number of children: Not on file   Years of education: Not on file   Highest education level: Not on file  Occupational History   Not on file  Tobacco Use   Smoking status: Never   Smokeless tobacco: Never  Vaping Use   Vaping status: Never Used  Substance and Sexual Activity   Alcohol use: Not Currently   Drug use: Not Currently    Types: Marijuana   Sexual activity: Not on file  Other Topics Concern   Not on file  Social History Narrative   Not on file  Social Drivers of Corporate investment banker Strain: Not on file  Food Insecurity: No Food Insecurity (05/09/2023)   Hunger Vital Sign    Worried About Running Out of Food in the Last Year: Never true    Ran Out of Food in the Last Year: Never true  Recent Concern: Food Insecurity - Food Insecurity Present (02/13/2023)   Hunger Vital Sign    Worried About Running Out of Food in the Last Year: Sometimes true    Ran Out of Food in the Last Year: Sometimes true  Transportation Needs: Unmet Transportation Needs (05/09/2023)   PRAPARE - Administrator, Civil Service (Medical): Yes    Lack of Transportation (Non-Medical): Yes  Physical Activity: Not on file  Stress: Not on file  Social Connections: Not on file  Intimate Partner Violence: Not At Risk (05/09/2023)   Humiliation, Afraid, Rape, and Kick questionnaire    Fear of Current or Ex-Partner: No    Emotionally Abused: No    Physically Abused: No    Sexually Abused: No    Family History:    Family History   Problem Relation Age of Onset   CAD Mother    Hypertension Mother    Diabetes Neg Hx    Stroke Neg Hx    Cancer Neg Hx    Kidney failure Neg Hx    Stomach cancer Neg Hx    Colon cancer Neg Hx    Rectal cancer Neg Hx      ROS:  Please see the history of present illness.   All other ROS reviewed and negative.     Physical Exam/Data:   Vitals:   06/25/23 0550 06/25/23 0600 06/25/23 0655 06/25/23 0700  BP:  (!) 112/100 (!) 115/99 (!) 109/49  Pulse:  90 86 84  Resp:  17 11 11   Temp: 98 F (36.7 C)     TempSrc:      SpO2:  97% 100% 100%  Weight:      Height:       No intake or output data in the 24 hours ending 06/25/23 0744    06/25/2023    1:57 AM 06/27/23    6:33 AM 05/11/2023    6:01 PM  Last 3 Weights  Weight (lbs) 216 lb 216 lb 0.8 oz 213 lb 13.5 oz  Weight (kg) 97.977 kg 98 kg 97 kg     Body mass index is 28.5 kg/m.  General:  Well nourished, well developed, in no acute distress HEENT: normal Neck: no JVD Vascular: No carotid bruits; Distal pulses 2+ bilaterally Cardiac:  normal S1, S2; RRR; no murmur. RUE AV fistula Lungs: Left basilar Rales Abd: soft, nontender, no hepatomegaly  Ext: no edema Musculoskeletal:  No deformities, BUE and BLE strength normal and equal Skin: warm and dry  Neuro:  CNs 2-12 intact, no focal abnormalities noted Psych:  Normal affect   EKG:  The EKG was personally reviewed and demonstrates:   EKG 06/25/2023: Sinus rhythm 93 bpm RBBB, LAFB LVH  Inferolateral ST depression more pronounced since June 27, 2023  Telemetry:  Telemetry was personally reviewed and demonstrates: No significant arrhythmia seen  Relevant CV Studies: Independently interpreted EKG 06/25/2023: Sinus rhythm 93 bpm RBBB, LAFB LVH  Inferolateral ST depression more pronounced since 06-27-2023  Independently interpreted TEE 05/10/2023: Mitral calcification, no mitral valve mass.  Mild MR. EF 55 to 60% Mild LA dilatation Moderate grade 3 protruding  plaque involving aortic arch and descending aorta Negative bubble study  Independently interpreted Coronary angiography 12/13/2022: Dist LM to Prox LAD lesion is 25% stenosed. CSA by IVUS 8.1 mm. Proximal LAD 50% stenosis, CSA by IVUS 6.2 mm Mid LAD 25% stenosis Ostial left circumflex 25% stenosis  Laboratory Data:  High Sensitivity Troponin:   Recent Labs  Lab 06/01/23 0240 06/01/23 0515 06/01/23 0650 06/25/23 0203 06/25/23 0448  TROPONINIHS 133* 181* 251* 860* 4,749*     Chemistry Recent Labs  Lab 06/25/23 0203 06/25/23 0208 06/25/23 0448  NA 140 139  140 140  K 4.5 4.4  4.4 4.8  CL 97* 99 98  CO2 24  --  25  GLUCOSE 156* 155* 175*  BUN 44* 44* 45*  CREATININE 14.45* 15.40* 14.50*  CALCIUM 9.3  --  8.7*  MG  --   --  2.0  GFRNONAA 4*  --  4*  ANIONGAP 19*  --  17*    Recent Labs  Lab 06/25/23 0203  PROT 8.3*  ALBUMIN 3.6  AST 25  ALT 15  ALKPHOS 79  BILITOT 0.7   Lipids No results for input(s): "CHOL", "TRIG", "HDL", "LABVLDL", "LDLCALC", "CHOLHDL" in the last 168 hours.  Hematology Recent Labs  Lab 06/25/23 0203 06/25/23 0208  WBC 9.6  --   RBC 3.98*  --   HGB 12.8* 13.3  12.9*  HCT 38.7* 39.0  38.0*  MCV 97.2  --   MCH 32.2  --   MCHC 33.1  --   RDW 16.5*  --   PLT 177  --    Thyroid No results for input(s): "TSH", "FREET4" in the last 168 hours.  BNP Recent Labs  Lab 06/25/23 0203 06/25/23 0448  BNP 620.3* 480.0*    DDimer No results for input(s): "DDIMER" in the last 168 hours.   Radiology/Studies:  DG Chest Port 1 View Result Date: 06/25/2023 CLINICAL DATA:  cp EXAM: PORTABLE CHEST 1 VIEW COMPARISON:  CT angio chest 06/01/2023, chest x-ray 06/01/2023 FINDINGS: The heart and mediastinal contours are unchanged. Atherosclerotic plaque Interval development of patchy bilateral mid to lower lung zone airspace opacities. No pulmonary edema. No pleural effusion. No pneumothorax. No acute osseous abnormality. IMPRESSION: 1. Interval  development of patchy bilateral mid to lower lung zone airspace opacities. Followup PA and lateral chest X-ray is recommended in 3-4 weeks following therapy to ensure resolution. 2.  Aortic Atherosclerosis (ICD10-I70.0). Electronically Signed   By: Tish Frederickson M.D.   On: 06/25/2023 02:18     Assessment and Plan:   52 y/o male w/hypertension, type 2 diabetes mellitus, ESRD on HD, moderate CAD (cath 11/2022), admitted with chest pain, NSTEMI  NSTEMI: Inferolateral ST depression more pronounced compared to previous EKGs.  High sensitive troponin >4700. Continue aspirin-currently on 325 mg daily, can be adjusted post cath depending on findings. Continue IV heparin, IV nitroglycerin drip. Continue Lipitor 80 mg daily, Coreg 12.5 mg twice daily. Currently on IV nitroglycerin at 5 mics.  Attempts to increase nitroglycerin for pain control has resulted in hypotension as low as SBP in 70s. Plan to perform left heart catheterization coronary angiography today for possible intervention. Patient previously developed large groin hematoma with femoral access heart catheterization in 11/2022.  Therefore, patient specifically mention his preference for radial access. To the patient that his radial access options are limited due to presence of right upper AV fistula.  Generally, we try to avoid contralateral radial access in dialysis patients to preserve arterial revascularization for future AV fistula.  However, given patient's preference, we will consider left  radial access.  I have reviewed the risks, indications, and alternatives to cardiac catheterization, possible angioplasty, and stenting with the patient. Risks include but are not limited to bleeding, infection, vascular injury, stroke, myocardial infection, arrhythmia, kidney injury, radiation-related injury in the case of prolonged fluoroscopy use, emergency cardiac surgery, and death. The patient understands the risks of serious complication is 1-2 in  1000 with diagnostic cardiac cath and 1-2% or less with angioplasty/stenting.   Acute hypoxic respiratory failure: Currently on 3 L oxygen, maintaining 100% sats. Suspect there may be component of fluid overload, although clinical exam is unremarkable other than left basilar Rales. He will need dialysis after heart catheterization, either today or tomorrow.  Hypertension: Controlled on above meds, hold irbesartan for now.  Also on amlodipine at home, holding due to low normal blood pressure.  Type 2 diabetes mellitus: Management as per primary team  ESRD: On dialysis, will need nephrology   Risk Assessment/Risk Scores:     TIMI Risk Score for Unstable Angina or Non-ST Elevation MI:   The patient's TIMI risk score is 6, which indicates a 41% risk of all cause mortality, new or recurrent myocardial infarction or need for urgent revascularization in the next 14 days.       For questions or updates, please contact [facility] Please consult [email or web address] for contact info under    Signed, Elder Negus, MD  06/25/2023 7:44 AM

## 2023-06-25 NOTE — Consult Note (Addendum)
Cardiology Consultation   Patient ID: Corey Park MRN: [id number]; DOB: 1971-01-16  Admit date: 06/25/2023 Date of Consult: 06/25/2023  PCP:  [name], NP   [facility] Providers Cardiologist:  Donato Schultz, MD   {    Patient Profile:   Corey Park is a 52 y.o. 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 Wallace Cullens.  History of Present Illness:   Mr. Cowick presented with chest pain that started at 10PM. He describes the pain as retrosternal, burning, non-exertional and with no radiation. He endorses SOB but no PND, orthopnea, diaphoresis, n/v, or palpitations. Has not missed HD, and last session was on Friday.   He had coronary angiogram in 11/2022 that showed non-obstructive disease with 25% stenosis of distal LM to pLAD, 50% pLAD, 25% mLAD, and ost Cx to pCx 25%.  He received 2 SL NG and 325 mg of ASA with EMS. In the ED, he was still complaining of chest pain. EKG showed RBBB, LVH, aVR elevation, V1 elevation, and ST depression in the inferolateral leads.    Past Medical History:  Diagnosis Date   Allergy, unspecified, initial encounter 08/25/2019   Asthma    as a child   Cellulitis, perineum 11/26/2019   CHF (congestive heart failure) (HCC) 2021   Complication of vascular dialysis catheter 08/25/2019   COVID-19 virus infection 07/27/2019   Diabetes mellitus without complication (HCC)    type 2   Dilated cardiomyopathy (HCC) 07/03/2018   ESRD on hemodialysis (HCC) 06/17/2018   MWF at [facility]   Fe deficiency anemia 12/23/2018   Hypertension    Legally blind    B/L   Pneumonia 2021   Type 2 diabetes mellitus with diabetic peripheral angiopathy without gangrene (HCC) 08/25/2019   Unspecified protein-calorie malnutrition (HCC) 08/25/2019    Past Surgical History:  Procedure Laterality Date   BASCILIC VEIN TRANSPOSITION Right 10/16/2019   Procedure:  Basilic Vein Transposition Right Arm;  Surgeon: Nada Libman, MD;  Location: [facility] OR;  Service: Vascular;  Laterality: Right;   BASCILIC VEIN TRANSPOSITION Right 01/29/2020   Procedure: RIGHT ARM SECOND STAGE BASCILIC VEIN TRANSPOSITION;  Surgeon: Nada Libman, MD;  Location: [facility] OR;  Service: Vascular;  Laterality: Right;   BIOPSY  12/22/2018   Procedure: BIOPSY;  Surgeon: Carman Ching, MD;  Location: [facility] ENDOSCOPY;  Service: Endoscopy;;   CORONARY ULTRASOUND/IVUS N/A 12/13/2022   Procedure: Coronary Ultrasound/IVUS;  Surgeon: Corky Crafts, MD;  Location: [facility] INVASIVE CV LAB;  Service: Cardiovascular;  Laterality: N/A;   ESOPHAGOGASTRODUODENOSCOPY N/A 12/22/2018   Procedure: ESOPHAGOGASTRODUODENOSCOPY (EGD);  Surgeon: Carman Ching, MD;  Location: [facility] ENDOSCOPY;  Service: Endoscopy;  Laterality: N/A;   IR FLUORO GUIDE CV LINE RIGHT  07/29/2019   IR US GUIDE VASC ACCESS RIGHT  07/29/2019   LEFT HEART CATH AND CORONARY ANGIOGRAPHY N/A 12/13/2022   Procedure: LEFT HEART CATH AND CORONARY ANGIOGRAPHY;  Surgeon: Corky Crafts, MD;  Location: [facility] INVASIVE CV LAB;  Service: Cardiovascular;  Laterality: N/A;   TRANSESOPHAGEAL ECHOCARDIOGRAM (CATH LAB) N/A 05/10/2023   Procedure: TRANSESOPHAGEAL ECHOCARDIOGRAM;  Surgeon: Sande Rives, MD;  Location: [facility] INVASIVE CV LAB;  Service: Cardiovascular;  Laterality: N/A;     Home Medications:  Prior to Admission medications   Medication Sig Start Date End Date Taking? Authorizing Provider  amLODipine (NORVASC) 5 MG tablet Take 1 tablet (5 mg total)  by mouth daily. 05/12/23 06/11/23  Jerald Kief, MD  atorvastatin (LIPITOR) 80 MG tablet Take 1 tablet (80 mg total) by mouth daily. 12/15/22   Modena Slater, DO  calcium acetate (PHOSLO) 667 MG capsule Take 2 capsules (1,334 mg total) by mouth with breakfast, with lunch, and with evening meal. Patient taking differently: Take 2,001 mg by mouth with breakfast, with lunch, and with evening meal.  12/14/22   Modena Slater, DO  carvedilol (COREG) 25 MG tablet Take 1 tablet (25 mg total) by mouth 2 (two) times daily with a meal. 12/14/22   Modena Slater, DO  famotidine (PEPCID) 20 MG tablet Take 20 mg by mouth daily. 04/20/23   [provider]  ferric citrate (AURYXIA) 1 GM 210 MG(Fe) tablet Take 2 tablets (420 mg total) by mouth daily. 12/14/22   Modena Slater, DO  gabapentin (NEURONTIN) 100 MG capsule Take 1 capsule (100 mg total) by mouth 2 (two) times daily. 04/03/23   Dorcas Carrow, MD  multivitamin (RENA-VIT) TABS tablet Take 1 tablet by mouth daily. 11/22/22   [provider]  nitroGLYCERIN (NITROSTAT) 0.4 MG SL tablet Place 0.4 mg under the tongue every 5 (five) minutes as needed for chest pain. Patient not taking: Reported on 05/09/2023 12/19/22   [provider]  olmesartan (BENICAR) 20 MG tablet Take 1 tablet (20 mg total) by mouth daily. 12/14/22   Modena Slater, DO  ondansetron (ZOFRAN-ODT) 4 MG disintegrating tablet Take by mouth. 05/25/23   [provider]  pantoprazole (PROTONIX) 20 MG tablet Take 20 mg by mouth daily. 04/19/23   [provider]    Inpatient Medications: Scheduled Meds:   Continuous Infusions:  azithromycin (ZITHROMAX) 500 mg in sodium chloride 0.9 % 250 mL IVPB     cefTRIAXone (ROCEPHIN)  IV 1 g (06/25/23 0306)   heparin 1,200 Units/hr (06/25/23 0304)   nitroGLYCERIN 5 mcg/min (06/25/23 0305)   PRN Meds:   Allergies:    Allergies  Allergen Reactions   Imdur [Isosorbide Nitrate] Other (See Comments)    Endorsed headache in the past - willing to retry    Social History:   Social History   Socioeconomic History   Marital status: Single    Spouse name: Not on file   Number of children: Not on file   Years of education: Not on file   Highest education level: Not on file  Occupational History   Not on file  Tobacco Use   Smoking status: Never   Smokeless tobacco: Never  Vaping Use   Vaping status: Never  Used  Substance and Sexual Activity   Alcohol use: Not Currently   Drug use: Not Currently    Types: Marijuana   Sexual activity: Not on file  Other Topics Concern   Not on file  Social History Narrative   Not on file   Social Drivers of Health   Financial Resource Strain: Not on file  Food Insecurity: No Food Insecurity (05/09/2023)   Hunger Vital Sign    Worried About Running Out of Food in the Last Year: Never true    Ran Out of Food in the Last Year: Never true  Recent Concern: Food Insecurity - Food Insecurity Present (02/13/2023)   Hunger Vital Sign    Worried About Running Out of Food in the Last Year: Sometimes true    Ran Out of Food in the Last Year: Sometimes true  Transportation Needs: Unmet Transportation Needs (05/09/2023)   PRAPARE - Transportation  Lack of Transportation (Medical): Yes    Lack of Transportation (Non-Medical): Yes  Physical Activity: Not on file  Stress: Not on file  Social Connections: Not on file  Intimate Partner Violence: Not At Risk (05/09/2023)   Humiliation, Afraid, Rape, and Kick questionnaire    Fear of Current or Ex-Partner: No    Emotionally Abused: No    Physically Abused: No    Sexually Abused: No    Family History:    Family History  Problem Relation Age of Onset   CAD Mother    Hypertension Mother    Diabetes Neg Hx    Stroke Neg Hx    Cancer Neg Hx    Kidney failure Neg Hx    Stomach cancer Neg Hx    Colon cancer Neg Hx    Rectal cancer Neg Hx      ROS:  Please see the history of present illness.   All other ROS reviewed and negative.     Physical Exam/Data:   Vitals:   06/25/23 0157 06/25/23 0158 06/25/23 0255  BP:  (!) 166/113 (!) 154/95  Pulse:  92 85  Resp:  18 (!) 24  Temp:  98.1 F (36.7 C)   TempSrc:  Oral   SpO2:   100%  Weight: 98 kg    Height: 6\' 1"  (1.854 m)     No intake or output data in the 24 hours ending 06/25/23 0316    06/25/2023    1:57 AM 06/01/2023    6:33 AM 05/11/2023     6:01 PM  Last 3 Weights  Weight (lbs) 216 lb 216 lb 0.8 oz 213 lb 13.5 oz  Weight (kg) 97.977 kg 98 kg 97 kg     Body mass index is 28.5 kg/m.  General:  Well nourished, well developed, in no acute distress HEENT: normal Neck: no JVD Vascular: Distal pulses 2+ bilaterally Cardiac:  normal S1, S2; RRR; no murmur Lungs:  bilateral wheezing and rales  Abd: soft, nontender Ext: no edema Musculoskeletal:  No deformities, BUE and BLE strength normal and equal Skin: warm and dry  Neuro:  no focal abnormalities noted Psych:  Normal affect   EKG:  The EKG was personally reviewed and demonstrates:  RBBB, LVH, aVR elevation (seen on prior EKGs), V1 elevation (seen on prior EKGs), and ST depression in the inferolateral leads (seen on prior EKGs).    Relevant CV Studies:  LHC 11/2022:   Dist LM to Prox LAD lesion is 25% stenosed.  Cross-sectional area of the left main by intravascular ultrasound was 8.1 mm.   Prox LAD lesion is 50% stenosed.  Cross-sectional area of this lesion by intravascular ultrasound was 6.2 mm.   Mid LAD lesion is 25% stenosed.   Ost Cx to Prox Cx lesion is 25% stenosed.   The left ventricular systolic function is normal.   LV end diastolic pressure is mildly elevated.   The left ventricular ejection fraction is 55-65% by visual estimate.   There is no aortic valve stenosis.   In the absence of any other complications or medical issues, we expect the patient to be ready for discharge from a cath perspective on 12/14/2022.   Recommend Aspirin 81mg  daily for moderate CAD.   Severe coronary calcification.  Nonobstructive disease throughout the left main, LAD and circumflex.  Continue medical therapy.  Laboratory Data:  High Sensitivity Troponin:   Recent Labs  Lab 06/01/23 0040 06/01/23 0240 06/01/23 0515 06/01/23 0650 06/25/23 0203  TROPONINIHS  113* 133* 181* 251* 860*     Chemistry Recent Labs  Lab 06/25/23 0203 06/25/23 0208  NA 140 139  140  K 4.5  4.4  4.4  CL 97* 99  CO2 24  --   GLUCOSE 156* 155*  BUN 44* 44*  CREATININE 14.45* 15.40*  CALCIUM 9.3  --   GFRNONAA 4*  --   ANIONGAP 19*  --     Recent Labs  Lab 06/25/23 0203  PROT 8.3*  ALBUMIN 3.6  AST 25  ALT 15  ALKPHOS 79  BILITOT 0.7   Lipids No results for input(s): "CHOL", "TRIG", "HDL", "LABVLDL", "LDLCALC", "CHOLHDL" in the last 168 hours.  Hematology Recent Labs  Lab 06/25/23 0203 06/25/23 0208  WBC 9.6  --   RBC 3.98*  --   HGB 12.8* 13.3  12.9*  HCT 38.7* 39.0  38.0*  MCV 97.2  --   MCH 32.2  --   MCHC 33.1  --   RDW 16.5*  --   PLT 177  --    Thyroid No results for input(s): "TSH", "FREET4" in the last 168 hours.  BNP Recent Labs  Lab 06/25/23 0203  BNP 620.3*    DDimer No results for input(s): "DDIMER" in the last 168 hours.   Radiology/Studies:  DG Chest Port 1 View Result Date: 06/25/2023 CLINICAL DATA:  cp EXAM: PORTABLE CHEST 1 VIEW COMPARISON:  CT angio chest 06/01/2023, chest x-ray 06/01/2023 FINDINGS: The heart and mediastinal contours are unchanged. Atherosclerotic plaque Interval development of patchy bilateral mid to lower lung zone airspace opacities. No pulmonary edema. No pleural effusion. No pneumothorax. No acute osseous abnormality. IMPRESSION: 1. Interval development of patchy bilateral mid to lower lung zone airspace opacities. Followup PA and lateral chest X-ray is recommended in 3-4 weeks following therapy to ensure resolution. 2.  Aortic Atherosclerosis (ICD10-I70.0). Electronically Signed   By: Tish Frederickson M.D.   On: 06/25/2023 02:18     Assessment and Plan:   NSTEMI Has had several ED visits for chest pain. Coronary angiogram in 11/2022 with non-obstructive disease. Today, he presents with chest pain and EKG showing RBBB, LVH, ST elevation in aVR and V1 with ST depression in the inferolateral leads. He previously had these changes on prior EKGs at different point of times. Troponin 860 (prior values were in the  100-400s range). Discussed with the interventional team; we'll plan on better control of chest pain with nitroglycerin gtt and trending troponin. - Trend troponin  - Heparin gtt (ACS protocol) - S/P ASA 325 mg with EMS. Continue ASA 81 mg daily - Continue atorvastatin 80 mg daily - Nitroglycerin gtt for chest pain control  - TTE in the AM - Keep NPO for potential coronary angiogram - Volume management with dialysis   Risk Assessment/Risk Scores:  TIMI Risk Score for Unstable Angina or Non-ST Elevation MI:   The patient's TIMI risk score is 5, which indicates a 26% risk of all cause mortality, new or recurrent myocardial infarction or need for urgent revascularization in the next 14 days.  New York Heart Association (NYHA) Functional Class NYHA Class II    For questions or updates, please contact [facility] Please consult [email or web address] for contact info under    Signed, Sharyn Creamer, MD  06/25/2023 3:16 AM

## 2023-06-25 NOTE — ED Triage Notes (Signed)
PER EMS: pt is from home with c/o sudden onset of central chest pain and shortness of breath, onset today around 0100. He has not felt well over the last few days. He receives dialysis M/W/F but has one coming up Tuesday due to the holidays.  Last dialyzed on Friday and he received the full treatment.  Rales heard. He received 2- SL nitroglycerin tablets and 324 mg of aspirin.   BP- 160/100, HR-110 CBG-171, 90% RA  18g LFA,

## 2023-06-25 NOTE — Progress Notes (Signed)
PHARMACY - ANTICOAGULATION CONSULT NOTE  Pharmacy Consult for Heparin Indication: chest pain/ACS; multivessel disease s/p cath  Allergies  Allergen Reactions   Imdur [Isosorbide Nitrate] Other (See Comments)    Endorsed headache in the past - willing to retry    Patient Measurements: Height: 6\' 1"  (185.4 cm) Weight: 98 kg (216 lb) IBW/kg (Calculated) : 79.9 Heparin Dosing Weight: HEPARIN DW (KG): 98 kg   Vital Signs: Temp: 98.2 F (36.8 C) (12/30 1426) Temp Source: Oral (12/30 1426) BP: 133/83 (12/30 1437) Pulse Rate: 80 (12/30 1437)  Labs: Recent Labs    06/25/23 0203 06/25/23 0208 06/25/23 0448 06/25/23 0813  HGB 12.8* 13.3  12.9*  --   --   HCT 38.7* 39.0  38.0*  --   --   PLT 177  --   --   --   CREATININE 14.45* 15.40* 14.50*  --   TROPONINIHS 860*  --  4,749* 10,982*    Estimated Creatinine Clearance: 7.3 mL/min (A) (by C-G formula based on SCr of 14.5 mg/dL (H)).   Medical History: Past Medical History:  Diagnosis Date   Allergy, unspecified, initial encounter 08/25/2019   Asthma    as a child   Cellulitis, perineum 11/26/2019   CHF (congestive heart failure) (HCC) 2021   Complication of vascular dialysis catheter 08/25/2019   COVID-19 virus infection 07/27/2019   Diabetes mellitus without complication (HCC)    type 2   Dilated cardiomyopathy (HCC) 07/03/2018   ESRD on hemodialysis (HCC) 06/17/2018   MWF at [facility]   Fe deficiency anemia 12/23/2018   Hypertension    Legally blind    B/L   Pneumonia 2021   Type 2 diabetes mellitus with diabetic peripheral angiopathy without gangrene (HCC) 08/25/2019   Unspecified protein-calorie malnutrition (HCC) 08/25/2019    Medications:  Medications Prior to Admission  Medication Sig Dispense Refill Last Dose/Taking   amLODipine (NORVASC) 5 MG tablet Take 1 tablet (5 mg total) by mouth daily. 30 tablet 0 Taking   carvedilol (COREG) 25 MG tablet Take 1 tablet (25 mg total) by mouth 2 (two) times daily with  a meal. 60 tablet 0 06/24/2023   multivitamin (RENA-VIT) TABS tablet Take 1 tablet by mouth daily.   06/24/2023   pantoprazole (PROTONIX) 20 MG tablet Take 20 mg by mouth daily.   06/24/2023   atorvastatin (LIPITOR) 80 MG tablet Take 1 tablet (80 mg total) by mouth daily. 30 tablet 0    calcium acetate (PHOSLO) 667 MG capsule Take 2 capsules (1,334 mg total) by mouth with breakfast, with lunch, and with evening meal. (Patient taking differently: Take 2,001 mg by mouth with breakfast, with lunch, and with evening meal.) 180 capsule 0    famotidine (PEPCID) 20 MG tablet Take 20 mg by mouth daily.      ferric citrate (AURYXIA) 1 GM 210 MG(Fe) tablet Take 2 tablets (420 mg total) by mouth daily. 60 tablet 0    gabapentin (NEURONTIN) 100 MG capsule Take 1 capsule (100 mg total) by mouth 2 (two) times daily.      nitroGLYCERIN (NITROSTAT) 0.4 MG SL tablet Place 0.4 mg under the tongue every 5 (five) minutes as needed for chest pain. (Patient not taking: Reported on 05/09/2023)      olmesartan (BENICAR) 20 MG tablet Take 1 tablet (20 mg total) by mouth daily. 30 tablet 0    ondansetron (ZOFRAN-ODT) 4 MG disintegrating tablet Take by mouth.       Assessment: 52 yo M admitted overnight  with CP and started on heparin infusion.  Pt is now s/p cardiac cath with multivessel disease and pending TCTS consult.  Pharmacy consulted to resume heparin 2 hrs post- TR band removal (removed at 1330).  Will start heparin at previous rate and order 8 hour follow-up level.    Goal of Therapy:  Heparin level 0.3-0.7 units/ml Monitor platelets by anticoagulation protocol: Yes   Plan:  At 1530, start heparin infusion at 1200 units/hr Heparin level in 8 hours (2330) Heparin level and CBC daily while on heparin.  [employer], Pharm.D., BCPS Clinical Pharmacist Clinical phone for 06/25/2023 from 7:30-3:00 is [phone].  **Pharmacist phone directory can be found on [email or web address] listed under [facility].  06/25/2023  2:47 PM

## 2023-06-25 NOTE — Progress Notes (Signed)
Patient refused scheduled lab draw.

## 2023-06-25 NOTE — Progress Notes (Signed)
PHARMACY - ANTICOAGULATION CONSULT NOTE  Pharmacy Consult for Heparin Indication: chest pain/ACS  Allergies  Allergen Reactions   Imdur [Isosorbide Nitrate] Other (See Comments)    Endorsed headache in the past - willing to retry    Patient Measurements: Height: 6\' 1"  (185.4 cm) Weight: 98 kg (216 lb) IBW/kg (Calculated) : 79.9  Vital Signs: Temp: 98.1 F (36.7 C) (12/30 0158) Temp Source: Oral (12/30 0158) BP: 166/113 (12/30 0158) Pulse Rate: 92 (12/30 0158)  Labs: Recent Labs    06/25/23 0203 06/25/23 0208  HGB 12.8* 13.3  12.9*  HCT 38.7* 39.0  38.0*  PLT 177  --   CREATININE  --  15.40*    Estimated Creatinine Clearance: 6.9 mL/min (A) (by C-G formula based on SCr of 15.4 mg/dL (H)).   Medical History: Past Medical History:  Diagnosis Date   Allergy, unspecified, initial encounter 08/25/2019   Asthma    as a child   Cellulitis, perineum 11/26/2019   CHF (congestive heart failure) (HCC) 2021   Complication of vascular dialysis catheter 08/25/2019   COVID-19 virus infection 07/27/2019   Diabetes mellitus without complication (HCC)    type 2   Dilated cardiomyopathy (HCC) 07/03/2018   ESRD on hemodialysis (HCC) 06/17/2018   MWF at [facility]   Fe deficiency anemia 12/23/2018   Hypertension    Legally blind    B/L   Pneumonia 2021   Type 2 diabetes mellitus with diabetic peripheral angiopathy without gangrene (HCC) 08/25/2019   Unspecified protein-calorie malnutrition (HCC) 08/25/2019    Medications:  No current facility-administered medications on file prior to encounter.   Current Outpatient Medications on File Prior to Encounter  Medication Sig Dispense Refill   amLODipine (NORVASC) 5 MG tablet Take 1 tablet (5 mg total) by mouth daily. 30 tablet 0   atorvastatin (LIPITOR) 80 MG tablet Take 1 tablet (80 mg total) by mouth daily. 30 tablet 0   calcium acetate (PHOSLO) 667 MG capsule Take 2 capsules (1,334 mg total) by mouth with breakfast, with lunch,  and with evening meal. (Patient taking differently: Take 2,001 mg by mouth with breakfast, with lunch, and with evening meal.) 180 capsule 0   carvedilol (COREG) 25 MG tablet Take 1 tablet (25 mg total) by mouth 2 (two) times daily with a meal. 60 tablet 0   famotidine (PEPCID) 20 MG tablet Take 20 mg by mouth daily.     ferric citrate (AURYXIA) 1 GM 210 MG(Fe) tablet Take 2 tablets (420 mg total) by mouth daily. 60 tablet 0   gabapentin (NEURONTIN) 100 MG capsule Take 1 capsule (100 mg total) by mouth 2 (two) times daily.     multivitamin (RENA-VIT) TABS tablet Take 1 tablet by mouth daily.     nitroGLYCERIN (NITROSTAT) 0.4 MG SL tablet Place 0.4 mg under the tongue every 5 (five) minutes as needed for chest pain. (Patient not taking: Reported on 05/09/2023)     olmesartan (BENICAR) 20 MG tablet Take 1 tablet (20 mg total) by mouth daily. 30 tablet 0   ondansetron (ZOFRAN-ODT) 4 MG disintegrating tablet Take by mouth.     pantoprazole (PROTONIX) 20 MG tablet Take 20 mg by mouth daily.       Assessment: 52 y.o. male with chest pain for heparin  Goal of Therapy:  Heparin level 0.3-0.7 units/ml Monitor platelets by anticoagulation protocol: Yes   Plan:  Heparin 4000 units IV bolus, then start heparin 1200 units/hr Check heparin level in 8 hours.   Corey Park, Corey Park  06/25/2023,2:42 AM

## 2023-06-25 NOTE — ED Provider Notes (Signed)
[facility] AT [facility] Provider Note  CSN: [id number] Arrival date & time: 06/25/23 0153  Chief Complaint(s) Chest Pain  HPI Corey Park is a 52 y.o. male with past medical history as below, significant for ESRD on HD, Dilacor myopathy, hypertension, CHF who presents to the ED with complaint of chest pain, dyspnea  Patient with sudden onset chest pain that woke her from sleep around 1 hour prior to arrival.  EMS gave him nitroglycerin and aspirin en route.  He was hypoxic mid 80s on room air initially.  Patient denies sig improvement to his symptoms following nitroglycerin or aspirin. Cp is midsternal, not radiating  Past Medical History Past Medical History:  Diagnosis Date   Allergy, unspecified, initial encounter 08/25/2019   Asthma    as a child   Cellulitis, perineum 11/26/2019   CHF (congestive heart failure) (HCC) 2021   Complication of vascular dialysis catheter 08/25/2019   COVID-19 virus infection 07/27/2019   Diabetes mellitus without complication (HCC)    type 2   Dilated cardiomyopathy (HCC) 07/03/2018   ESRD on hemodialysis (HCC) 06/17/2018   MWF at [facility]   Fe deficiency anemia 12/23/2018   Hypertension    Legally blind    B/L   Pneumonia 2021   Type 2 diabetes mellitus with diabetic peripheral angiopathy without gangrene (HCC) 08/25/2019   Unspecified protein-calorie malnutrition (HCC) 08/25/2019   Patient Active Problem List   Diagnosis Date Noted   NSTEMI (non-ST elevated myocardial infarction) (HCC) 06/25/2023   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   Obesity (BMI 30-39.9) 05/09/2023   Chest pain 05/09/2023   Acute hypoxic respiratory failure (HCC) 04/02/2023   Fluid overload 04/02/2023   Chest pain, atypical 01/03/2023   Anemia 12/12/2022   Iron deficiency anemia due to chronic blood loss 12/11/2022   Syncope and collapse 10/19/2022   Nonspecific chest pain 09/12/2022   Myocardial injury 08/06/2022   Non-cardiac chest  pain 08/05/2022   (HFpEF) heart failure with preserved ejection fraction (HCC) 08/05/2022   Need for assistance at home without other household member able to render care 08/05/2022   Adult general medical exam 07/11/2022   Pneumonia 04/17/2022   Sepsis (HCC) 04/17/2022   Acute blood loss anemia 04/17/2022   Essential hypertension 04/17/2022   Thrombocytopenia (HCC) 04/17/2022   History of GI bleed 04/17/2022   Atypical chest pain 04/17/2022   GERD (gastroesophageal reflux disease) 04/17/2022   Nausea and vomiting 04/17/2022   Chronic hiccups 04/17/2022   Pain due to onychomycosis of toenails of both feet 10/11/2021   Hypercalcemia 04/13/2020   Coagulation defect, unspecified (HCC) 08/25/2019   Secondary hyperparathyroidism of renal origin (HCC) 08/25/2019   History of anemia due to chronic kidney disease 12/23/2018   End-stage renal disease on hemodialysis (HCC)    Chronic systolic heart failure (HCC) 07/03/2018   Elevated troponin    Hypertensive urgency 12/11/2016   HLD (hyperlipidemia) 06/14/2007   DM2 (diabetes mellitus, type 2) (HCC) 06/11/2007   Hypertensive heart disease with CHF (congestive heart failure) (HCC) 06/11/2007   Home Medication(s) Prior to Admission medications   Medication Sig Start Date End Date Taking? Authorizing Provider  amLODipine (NORVASC) 5 MG tablet Take 1 tablet (5 mg total) by mouth daily. 05/12/23 10/09/23 Yes Jerald Kief, MD  atorvastatin (LIPITOR) 80 MG tablet Take 1 tablet (80 mg total) by mouth daily. 12/15/22   Modena Slater, DO  calcium acetate (PHOSLO) 667 MG capsule Take 2 capsules (1,334 mg total) by mouth with breakfast,  with lunch, and with evening meal. Patient taking differently: Take 2,001 mg by mouth with breakfast, with lunch, and with evening meal. 12/14/22   Modena Slater, DO  carvedilol (COREG) 25 MG tablet Take 1 tablet (25 mg total) by mouth 2 (two) times daily with a meal. 12/14/22   Modena Slater, DO  famotidine (PEPCID) 20 MG  tablet Take 20 mg by mouth daily. 04/20/23   [provider]  ferric citrate (AURYXIA) 1 GM 210 MG(Fe) tablet Take 2 tablets (420 mg total) by mouth daily. 12/14/22   Modena Slater, DO  gabapentin (NEURONTIN) 100 MG capsule Take 1 capsule (100 mg total) by mouth 2 (two) times daily. 04/03/23   Dorcas Carrow, MD  multivitamin (RENA-VIT) TABS tablet Take 1 tablet by mouth daily. 11/22/22   [provider]  nitroGLYCERIN (NITROSTAT) 0.4 MG SL tablet Place 0.4 mg under the tongue every 5 (five) minutes as needed for chest pain. Patient not taking: Reported on 05/09/2023 12/19/22   [provider]  olmesartan (BENICAR) 20 MG tablet Take 1 tablet (20 mg total) by mouth daily. 12/14/22   Modena Slater, DO  ondansetron (ZOFRAN-ODT) 4 MG disintegrating tablet Take by mouth. 05/25/23   [provider]  pantoprazole (PROTONIX) 20 MG tablet Take 20 mg by mouth daily. 04/19/23   [provider]                                                                                                                                    Past Surgical History Past Surgical History:  Procedure Laterality Date   BASCILIC VEIN TRANSPOSITION Right 10/16/2019   Procedure: Basilic Vein Transposition Right Arm;  Surgeon: Nada Libman, MD;  Location: [facility] OR;  Service: Vascular;  Laterality: Right;   BASCILIC VEIN TRANSPOSITION Right 01/29/2020   Procedure: RIGHT ARM SECOND STAGE BASCILIC VEIN TRANSPOSITION;  Surgeon: Nada Libman, MD;  Location: [facility] OR;  Service: Vascular;  Laterality: Right;   BIOPSY  12/22/2018   Procedure: BIOPSY;  Surgeon: Carman Ching, MD;  Location: [facility] ENDOSCOPY;  Service: Endoscopy;;   CORONARY ULTRASOUND/IVUS N/A 12/13/2022   Procedure: Coronary Ultrasound/IVUS;  Surgeon: Corky Crafts, MD;  Location: [facility] INVASIVE CV LAB;  Service: Cardiovascular;  Laterality: N/A;   ESOPHAGOGASTRODUODENOSCOPY N/A 12/22/2018   Procedure: ESOPHAGOGASTRODUODENOSCOPY (EGD);   Surgeon: Carman Ching, MD;  Location: [facility] ENDOSCOPY;  Service: Endoscopy;  Laterality: N/A;   IR FLUORO GUIDE CV LINE RIGHT  07/29/2019   IR US GUIDE VASC ACCESS RIGHT  07/29/2019   LEFT HEART CATH AND CORONARY ANGIOGRAPHY N/A 12/13/2022   Procedure: LEFT HEART CATH AND CORONARY ANGIOGRAPHY;  Surgeon: Corky Crafts, MD;  Location: [facility] INVASIVE CV LAB;  Service: Cardiovascular;  Laterality: N/A;   TRANSESOPHAGEAL ECHOCARDIOGRAM (CATH LAB) N/A 05/10/2023   Procedure: TRANSESOPHAGEAL ECHOCARDIOGRAM;  Surgeon: Sande Rives, MD;  Location: [facility] INVASIVE CV LAB;  Service: Cardiovascular;  Laterality: N/A;   Family  History Family History  Problem Relation Age of Onset   CAD Mother    Hypertension Mother    Diabetes Neg Hx    Stroke Neg Hx    Cancer Neg Hx    Kidney failure Neg Hx    Stomach cancer Neg Hx    Colon cancer Neg Hx    Rectal cancer Neg Hx     Social History Social History   Tobacco Use   Smoking status: Never   Smokeless tobacco: Never  Vaping Use   Vaping status: Never Used  Substance Use Topics   Alcohol use: Not Currently   Drug use: Not Currently    Types: Marijuana   Allergies Imdur [isosorbide nitrate]  Review of Systems Review of Systems  Constitutional:  Positive for diaphoresis. Negative for chills and fever.  Respiratory:  Positive for shortness of breath.   Cardiovascular:  Positive for chest pain.  Gastrointestinal:  Negative for abdominal pain.  Genitourinary:  Negative for flank pain.  Musculoskeletal:  Negative for arthralgias.  Skin:  Negative for rash.  Neurological:  Positive for light-headedness. Negative for weakness.  All other systems reviewed and are negative.   Physical Exam Vital Signs  I have reviewed the triage vital signs BP (!) 112/100   Pulse 90   Temp 98 F (36.7 C)   Resp 17   Ht 6\' 1"  (1.854 m)   Wt 98 kg   SpO2 97%   BMI 28.50 kg/m  Physical Exam Vitals and nursing note reviewed.  Constitutional:       General: He is not in acute distress.    Appearance: He is well-developed. He is obese.  HENT:     Head: Normocephalic and atraumatic.     Right Ear: External ear normal.     Left Ear: External ear normal.     Mouth/Throat:     Mouth: Mucous membranes are moist.  Eyes:     General: No scleral icterus. Cardiovascular:     Rate and Rhythm: Normal rate and regular rhythm.     Pulses: Normal pulses.     Heart sounds: Normal heart sounds.  Pulmonary:     Effort: Pulmonary effort is normal. Tachypnea present. No respiratory distress.     Breath sounds: Normal breath sounds.  Abdominal:     General: Abdomen is flat.     Palpations: Abdomen is soft.     Tenderness: There is no abdominal tenderness.  Musculoskeletal:       Arms:     Cervical back: No rigidity.     Right lower leg: No edema.     Left lower leg: No edema.  Skin:    General: Skin is warm and dry.     Capillary Refill: Capillary refill takes less than 2 seconds.  Neurological:     Mental Status: He is alert.  Psychiatric:        Mood and Affect: Mood normal.        Behavior: Behavior normal.     ED Results and Treatments Labs (all labs ordered are listed, but only abnormal results are displayed) Labs Reviewed  COMPREHENSIVE METABOLIC PANEL - Abnormal; Notable for the following components:      Result Value   Chloride 97 (*)    Glucose, Bld 156 (*)    BUN 44 (*)    Creatinine, Ser 14.45 (*)    Total Protein 8.3 (*)    GFR, Estimated 4 (*)    Anion gap 19 (*)  All other components within normal limits  CBC WITH DIFFERENTIAL/PLATELET - Abnormal; Notable for the following components:   RBC 3.98 (*)    Hemoglobin 12.8 (*)    HCT 38.7 (*)    RDW 16.5 (*)    All other components within normal limits  BRAIN NATRIURETIC PEPTIDE - Abnormal; Notable for the following components:   B Natriuretic Peptide 620.3 (*)    All other components within normal limits  BASIC METABOLIC PANEL - Abnormal; Notable for the  following components:   Glucose, Bld 175 (*)    BUN 45 (*)    Creatinine, Ser 14.50 (*)    Calcium 8.7 (*)    GFR, Estimated 4 (*)    Anion gap 17 (*)    All other components within normal limits  BRAIN NATRIURETIC PEPTIDE - Abnormal; Notable for the following components:   B Natriuretic Peptide 480.0 (*)    All other components within normal limits  PHOSPHORUS - Abnormal; Notable for the following components:   Phosphorus 7.5 (*)    All other components within normal limits  I-STAT CHEM 8, ED - Abnormal; Notable for the following components:   BUN 44 (*)    Creatinine, Ser 15.40 (*)    Glucose, Bld 155 (*)    Calcium, Ion 1.01 (*)    Hemoglobin 12.9 (*)    HCT 38.0 (*)    All other components within normal limits  I-STAT VENOUS BLOOD GAS, ED - Abnormal; Notable for the following components:   pH, Ven 7.438 (*)    Bicarbonate 31.3 (*)    TCO2 33 (*)    Acid-Base Excess 6.0 (*)    Calcium, Ion 1.01 (*)    All other components within normal limits  CBG MONITORING, ED - Abnormal; Notable for the following components:   Glucose-Capillary 152 (*)    All other components within normal limits  TROPONIN I (HIGH SENSITIVITY) - Abnormal; Notable for the following components:   Troponin I (High Sensitivity) 860 (*)    All other components within normal limits  TROPONIN I (HIGH SENSITIVITY) - Abnormal; Notable for the following components:   Troponin I (High Sensitivity) 4,749 (*)    All other components within normal limits  MAGNESIUM  PROCALCITONIN  HEPARIN LEVEL (UNFRACTIONATED)  HEMOGLOBIN A1C                                                                                                                          Radiology DG Chest Port 1 View Result Date: 06/25/2023 CLINICAL DATA:  cp EXAM: PORTABLE CHEST 1 VIEW COMPARISON:  CT angio chest 06/01/2023, chest x-ray 06/01/2023 FINDINGS: The heart and mediastinal contours are unchanged. Atherosclerotic plaque Interval development  of patchy bilateral mid to lower lung zone airspace opacities. No pulmonary edema. No pleural effusion. No pneumothorax. No acute osseous abnormality. IMPRESSION: 1. Interval development of patchy bilateral mid to lower lung zone airspace opacities. Followup PA and lateral chest X-ray is recommended in 3-4  weeks following therapy to ensure resolution. 2.  Aortic Atherosclerosis (ICD10-I70.0). Electronically Signed   By: Tish Frederickson M.D.   On: 06/25/2023 02:18    Pertinent labs & imaging results that were available during my care of the patient were reviewed by me and considered in my medical decision making (see MDM for details).  Medications Ordered in ED Medications  nitroGLYCERIN 50 mg in dextrose 5 % 250 mL (0.2 mg/mL) infusion (10 mcg/min Intravenous Rate/Dose Change 06/25/23 0520)  heparin ADULT infusion 100 units/mL (25000 units/255mL) (1,200 Units/hr Intravenous New Bag/Given 06/25/23 0304)  acetaminophen (TYLENOL) tablet 650 mg (has no administration in time range)    Or  acetaminophen (TYLENOL) suppository 650 mg (has no administration in time range)  ondansetron (ZOFRAN) injection 4 mg (has no administration in time range)  azithromycin (ZITHROMAX) 500 mg in sodium chloride 0.9 % 250 mL IVPB (has no administration in time range)  cefTRIAXone (ROCEPHIN) 1 g in sodium chloride 0.9 % 100 mL IVPB (has no administration in time range)  atorvastatin (LIPITOR) tablet 80 mg (80 mg Oral Given 06/25/23 0448)  calcium acetate (PHOSLO) capsule 2,001 mg (has no administration in time range)  carvedilol (COREG) tablet 12.5 mg (12.5 mg Oral Given 06/25/23 0448)  famotidine (PEPCID) tablet 20 mg (has no administration in time range)  irbesartan (AVAPRO) tablet 150 mg (150 mg Oral Given 06/25/23 0448)  pantoprazole (PROTONIX) injection 40 mg (has no administration in time range)  insulin aspart (novoLOG) injection 0-6 Units (1 Units Subcutaneous Given 06/25/23 0533)  cefTRIAXone (ROCEPHIN) 1 g in  sodium chloride 0.9 % 100 mL IVPB (0 g Intravenous Stopped 06/25/23 0353)  azithromycin (ZITHROMAX) 500 mg in sodium chloride 0.9 % 250 mL IVPB (0 mg Intravenous Stopped 06/25/23 0500)  heparin bolus via infusion 4,000 Units (4,000 Units Intravenous Bolus from Bag 06/25/23 0304)                                                                                                                                     Procedures .Critical Care  Performed by: Sloan Leiter, DO Authorized by: Sloan Leiter, DO   Critical care provider statement:    Critical care time (minutes):  49   Critical care time was exclusive of:  Separately billable procedures and treating other patients   Critical care was necessary to treat or prevent imminent or life-threatening deterioration of the following conditions:  Cardiac failure   Critical care was time spent personally by me on the following activities:  Development of treatment plan with patient or surrogate, discussions with consultants, evaluation of patient's response to treatment, examination of patient, ordering and review of laboratory studies, ordering and review of radiographic studies, ordering and performing treatments and interventions, pulse oximetry, re-evaluation of patient's condition, review of old charts and obtaining history from patient or surrogate   Care discussed with: admitting provider     (including critical care  time)  Medical Decision Making / ED Course    Medical Decision Making:    Nathane Saballos is a 52 y.o. male  with past medical history as below, significant for ESRD on HD, Dilacor myopathy, hypertension, CHF who presents to the ED with complaint of chest pain, dyspnea. The complaint involves an extensive differential diagnosis and also carries with it a high risk of complications and morbidity.  Serious etiology was considered. Ddx includes but is not limited to: Differential includes all life-threatening causes for chest pain.  This includes but is not exclusive to acute coronary syndrome, aortic dissection, pulmonary embolism, cardiac tamponade, community-acquired pneumonia, pericarditis, musculoskeletal chest wall pain, etc. In my evaluation of this patient's dyspnea my DDx includes, but is not limited to, pneumonia, pulmonary embolism, pneumothorax, pulmonary edema, metabolic acidosis, asthma, COPD, cardiac cause, anemia, anxiety, etc.    Complete initial physical exam performed, notably the patient was in mild distress, HDS.[name]    Reviewed and confirmed nursing documentation for past medical history, family history, social history.  Vital signs reviewed.    Initial EKG is abnormal, will engage cardiology, not appear to currently meet STEMI criteria.  Clinical Course as of 06/25/23 6962  Mon Jun 25, 2023  0208 Spoke with cardiology fellow Dr Granville Lewis, will come eval pt, no code stemi activation at this time  [SG]  0246 Spoke with cards fellow, no STEMI activation; recommending heparin/ NTG gtt  [SG]  0250 Spoke again with cards fellow, no cath tonight, cont heparin/ntg gtt, recommend medicine admission [SG]  0251 ESRD HD last HD Sat, will likely require HD while admitted, recommend consult nephro in the AM to arrange, does not appear to require HD emergently this evening  [SG]    Clinical Course User Index [SG] Sloan Leiter, DO    Brief summary: 52 year old male history of ESRD on HD, last dialysis on Saturday.  No missed sessions per the patient.  Sudden onset chest pain dyspnea woke up from sleep around 1 AM.  Minimal improvement with aspirin and nitroglycerin by EMS.  He was placed on nasal cannula by EMS with improvement to respiratory status.  EKG abnormal, discussed with cardiology fellow who recommends starting nitroglycerin and heparin, no STEMI activation or emergent Cath Lab.  They plan to take patient to Cath Lab today, recommend trending troponin as well.  Troponin is acutely elevated over 800, will trend.   Pain somewhat improved at this point.  Plan admission to medicine with cardiology and consult  He had TEE 05/10/2023 with LVEF 55 to 60% He also had left heart cath on 12/13/2022 Dr. Eldridge Dace which shows severe coronary calcification, nonobstructive disease through left main LAD and circumflex; medical therapy was recommended; no stenting performed at that time  He does not appear to be overtly volume overloaded, recommend discussion with nephrology in the morning to arrange for dialysis while admitted.  Chest x-ray with possible infiltrate, pneumonia seems less likely but he has high risk for decompensation.  Will cover with Rocephin and azithromycin..  Not septic    Admit to [facility]              Additional history obtained: -Additional history obtained from EMS -External records from outside source obtained and reviewed including: Chart review including previous notes, labs, imaging, consultation notes including  Prior ED visits, home medication, prior labs and imaging   Lab Tests: -I ordered, reviewed, and interpreted labs.   The pertinent results include:   Labs Reviewed  COMPREHENSIVE METABOLIC PANEL -  Abnormal; Notable for the following components:      Result Value   Chloride 97 (*)    Glucose, Bld 156 (*)    BUN 44 (*)    Creatinine, Ser 14.45 (*)    Total Protein 8.3 (*)    GFR, Estimated 4 (*)    Anion gap 19 (*)    All other components within normal limits  CBC WITH DIFFERENTIAL/PLATELET - Abnormal; Notable for the following components:   RBC 3.98 (*)    Hemoglobin 12.8 (*)    HCT 38.7 (*)    RDW 16.5 (*)    All other components within normal limits  BRAIN NATRIURETIC PEPTIDE - Abnormal; Notable for the following components:   B Natriuretic Peptide 620.3 (*)    All other components within normal limits  BASIC METABOLIC PANEL - Abnormal; Notable for the following components:   Glucose, Bld 175 (*)    BUN 45 (*)    Creatinine, Ser 14.50 (*)    Calcium  8.7 (*)    GFR, Estimated 4 (*)    Anion gap 17 (*)    All other components within normal limits  BRAIN NATRIURETIC PEPTIDE - Abnormal; Notable for the following components:   B Natriuretic Peptide 480.0 (*)    All other components within normal limits  PHOSPHORUS - Abnormal; Notable for the following components:   Phosphorus 7.5 (*)    All other components within normal limits  I-STAT CHEM 8, ED - Abnormal; Notable for the following components:   BUN 44 (*)    Creatinine, Ser 15.40 (*)    Glucose, Bld 155 (*)    Calcium, Ion 1.01 (*)    Hemoglobin 12.9 (*)    HCT 38.0 (*)    All other components within normal limits  I-STAT VENOUS BLOOD GAS, ED - Abnormal; Notable for the following components:   pH, Ven 7.438 (*)    Bicarbonate 31.3 (*)    TCO2 33 (*)    Acid-Base Excess 6.0 (*)    Calcium, Ion 1.01 (*)    All other components within normal limits  CBG MONITORING, ED - Abnormal; Notable for the following components:   Glucose-Capillary 152 (*)    All other components within normal limits  TROPONIN I (HIGH SENSITIVITY) - Abnormal; Notable for the following components:   Troponin I (High Sensitivity) 860 (*)    All other components within normal limits  TROPONIN I (HIGH SENSITIVITY) - Abnormal; Notable for the following components:   Troponin I (High Sensitivity) 4,749 (*)    All other components within normal limits  MAGNESIUM  PROCALCITONIN  HEPARIN LEVEL (UNFRACTIONATED)  HEMOGLOBIN A1C    Notable for as above  EKG   EKG Interpretation Date/Time:  Monday June 25 2023 01:58:19 EST Ventricular Rate:  95 PR Interval:  180 QRS Duration:  127 QT Interval:  425 QTC Calculation: 535 R Axis:   -40  Text Interpretation: Sinus rhythm RBBB and LAFB LVH with secondary repolarization abnormality AVR elevation new from prior, diffuse depression noted Confirmed by Tanda Rockers ([id number]) on 06/25/2023 2:50:30 AM         Imaging Studies ordered: I ordered imaging studies  including CXR I independently visualized the following imaging with scope of interpretation limited to determining acute life threatening conditions related to emergency care; findings noted above I independently visualized and interpreted imaging. I agree with the radiologist interpretation   Medicines ordered and prescription drug management: Meds ordered this encounter  Medications  cefTRIAXone (ROCEPHIN) 1 g in sodium chloride 0.9 % 100 mL IVPB    Antibiotic Indication::   CAP   azithromycin (ZITHROMAX) 500 mg in sodium chloride 0.9 % 250 mL IVPB   nitroGLYCERIN 50 mg in dextrose 5 % 250 mL (0.2 mg/mL) infusion   heparin bolus via infusion 4,000 Units   heparin ADULT infusion 100 units/mL (25000 units/262mL)   OR Linked Order Group    acetaminophen (TYLENOL) tablet 650 mg    acetaminophen (TYLENOL) suppository 650 mg   ondansetron (ZOFRAN) injection 4 mg   azithromycin (ZITHROMAX) 500 mg in sodium chloride 0.9 % 250 mL IVPB    Antibiotic Indication::   CAP   cefTRIAXone (ROCEPHIN) 1 g in sodium chloride 0.9 % 100 mL IVPB    Antibiotic Indication::   CAP   atorvastatin (LIPITOR) tablet 80 mg   calcium acetate (PHOSLO) capsule 2,001 mg   carvedilol (COREG) tablet 12.5 mg   famotidine (PEPCID) tablet 20 mg   irbesartan (AVAPRO) tablet 150 mg   pantoprazole (PROTONIX) injection 40 mg   insulin aspart (novoLOG) injection 0-6 Units    Correction coverage::   Very Sensitive (ESRD/Dialysis)    CBG < 70::   Implement Hypoglycemia Standing Orders and refer to Hypoglycemia Standing Orders sidebar report    CBG 70 - 120::   0 units    CBG 121 - 150::   0 units    CBG 151 - 200::   1 unit    CBG 201-250::   2 units    CBG 251-300::   3 units    CBG 301-350::   4 units    CBG 351-400::   5 units    CBG > 400:   Give 6 units and call MD    -I have reviewed the patients home medicines and have made adjustments as needed   Consultations Obtained: I requested consultation with the  cardiology Dr Granville Lewis,  and discussed lab and imaging findings as well as pertinent plan - they recommend: medical admit   Cardiac Monitoring: The patient was maintained on a cardiac monitor.  I personally viewed and interpreted the cardiac monitored which showed an underlying rhythm of: nsr Continuous pulse oximetry interpreted by myself, 98% on 2L.    Social Determinants of Health:  Diagnosis or treatment significantly limited by social determinants of health: esrd on hd   Reevaluation: After the interventions noted above, I reevaluated the patient and found that they have improved  Co morbidities that complicate the patient evaluation  Past Medical History:  Diagnosis Date   Allergy, unspecified, initial encounter 08/25/2019   Asthma    as a child   Cellulitis, perineum 11/26/2019   CHF (congestive heart failure) (HCC) 2021   Complication of vascular dialysis catheter 08/25/2019   COVID-19 virus infection 07/27/2019   Diabetes mellitus without complication (HCC)    type 2   Dilated cardiomyopathy (HCC) 07/03/2018   ESRD on hemodialysis (HCC) 06/17/2018   MWF at [facility]   Fe deficiency anemia 12/23/2018   Hypertension    Legally blind    B/L   Pneumonia 2021   Type 2 diabetes mellitus with diabetic peripheral angiopathy without gangrene (HCC) 08/25/2019   Unspecified protein-calorie malnutrition (HCC) 08/25/2019      Dispostion: Disposition decision including need for hospitalization was considered, and patient admitted to the hospital.    Final Clinical Impression(s) / ED Diagnoses Final diagnoses:  NSTEMI (non-ST elevated myocardial infarction) (HCC)  Chest pain, unspecified type  ESRD  on hemodialysis [facility])        Sloan Leiter, DO 06/25/23 [phone]

## 2023-06-25 NOTE — Consult Note (Addendum)
Cardiology Consultation   Patient ID: Corey Park MRN: [id number]; DOB: 01/31/1971  Admit date: 06/25/2023 Date of Consult: 06/25/2023  PCP:  [name], NP   [facility] Providers Cardiologist:  Donato Schultz, MD   {   Patient Profile:   Corey Park is a 52 y.o. male who is being seen 06/25/2023 for the evaluation of chest pain at the request of Marinda Elk, MD.  History of Present Illness:   Corey Park is a 51 y/o male w/hypertension, type 2 diabetes mellitus, ESRD on HD, moderate CAD (cath 11/2022), admitted with chest pain, NSTEMI  Patient presented to [facility], ER on 12:29 PM with complaints of chest pain.  Chest pain has been ongoing with exertion for last 3-4 days, got worse to 10/10 prompting ER visit.  Workup in the ER showed EKG with inferolateral ST depression more prominent compared to prior, high sensitive troponin up to 4700, blood pressure control.  Chest x-ray showed bilateral airspace disease, but denies any fever, chills.  He has had nonproductive cough.  At baseline, he  makes plans for mornings, sedentary job.  He is non-smoker.  He gets dialysis through right upper arm AV fistula Monday Wednesday Friday, schedule changed due to holidays.  Next Alysis is due on Tuesday, 06/26/2023.  His last heart catheterization was in 11/2022 that showed moderate nonobstructive CAD, including moderate left main disease, with IVUS CAC of 8.1 mm.  Reportedly, patient had large hematoma after procedure performed through femoral access.  Therefore, patient prefers radial access, if possible.   Past Medical History:  Diagnosis Date   Allergy, unspecified, initial encounter 08/25/2019   Asthma    as a child   Cellulitis, perineum 11/26/2019   CHF (congestive heart failure) (HCC) 2021   Complication of vascular dialysis catheter 08/25/2019   COVID-19 virus infection 07/27/2019   Diabetes mellitus without complication (HCC)    type 2   Dilated  cardiomyopathy (HCC) 07/03/2018   ESRD on hemodialysis (HCC) 06/17/2018   MWF at [facility]   Fe deficiency anemia 12/23/2018   Hypertension    Legally blind    B/L   Pneumonia 2021   Type 2 diabetes mellitus with diabetic peripheral angiopathy without gangrene (HCC) 08/25/2019   Unspecified protein-calorie malnutrition (HCC) 08/25/2019    Past Surgical History:  Procedure Laterality Date   BASCILIC VEIN TRANSPOSITION Right 10/16/2019   Procedure: Basilic Vein Transposition Right Arm;  Surgeon: Nada Libman, MD;  Location: [facility] OR;  Service: Vascular;  Laterality: Right;   BASCILIC VEIN TRANSPOSITION Right 01/29/2020   Procedure: RIGHT ARM SECOND STAGE BASCILIC VEIN TRANSPOSITION;  Surgeon: Nada Libman, MD;  Location: [facility] OR;  Service: Vascular;  Laterality: Right;   BIOPSY  12/22/2018   Procedure: BIOPSY;  Surgeon: Carman Ching, MD;  Location: [facility] ENDOSCOPY;  Service: Endoscopy;;   CORONARY ULTRASOUND/IVUS N/A 12/13/2022   Procedure: Coronary Ultrasound/IVUS;  Surgeon: Corky Crafts, MD;  Location: [facility] INVASIVE CV LAB;  Service: Cardiovascular;  Laterality: N/A;   ESOPHAGOGASTRODUODENOSCOPY N/A 12/22/2018   Procedure: ESOPHAGOGASTRODUODENOSCOPY (EGD);  Surgeon: Carman Ching, MD;  Location: [facility] ENDOSCOPY;  Service: Endoscopy;  Laterality: N/A;   IR FLUORO GUIDE CV LINE RIGHT  07/29/2019   IR US GUIDE VASC ACCESS RIGHT  07/29/2019   LEFT HEART CATH AND CORONARY ANGIOGRAPHY N/A 12/13/2022   Procedure: LEFT HEART CATH AND CORONARY ANGIOGRAPHY;  Surgeon: Corky Crafts, MD;  Location: [facility] INVASIVE CV LAB;  Service: Cardiovascular;  Laterality: N/A;  TRANSESOPHAGEAL ECHOCARDIOGRAM (CATH LAB) N/A 05/10/2023   Procedure: TRANSESOPHAGEAL ECHOCARDIOGRAM;  Surgeon: Sande Rives, MD;  Location: [facility] INVASIVE CV LAB;  Service: Cardiovascular;  Laterality: N/A;     Home Medications:  Prior to Admission medications   Medication Sig Start Date End Date Taking? Authorizing Provider   amLODipine (NORVASC) 5 MG tablet Take 1 tablet (5 mg total) by mouth daily. 05/12/23 10/09/23 Yes Jerald Kief, MD  atorvastatin (LIPITOR) 80 MG tablet Take 1 tablet (80 mg total) by mouth daily. 12/15/22   Modena Slater, DO  calcium acetate (PHOSLO) 667 MG capsule Take 2 capsules (1,334 mg total) by mouth with breakfast, with lunch, and with evening meal. Patient taking differently: Take 2,001 mg by mouth with breakfast, with lunch, and with evening meal. 12/14/22   Modena Slater, DO  carvedilol (COREG) 25 MG tablet Take 1 tablet (25 mg total) by mouth 2 (two) times daily with a meal. 12/14/22   Modena Slater, DO  famotidine (PEPCID) 20 MG tablet Take 20 mg by mouth daily. 04/20/23   [provider]  ferric citrate (AURYXIA) 1 GM 210 MG(Fe) tablet Take 2 tablets (420 mg total) by mouth daily. 12/14/22   Modena Slater, DO  gabapentin (NEURONTIN) 100 MG capsule Take 1 capsule (100 mg total) by mouth 2 (two) times daily. 04/03/23   Dorcas Carrow, MD  multivitamin (RENA-VIT) TABS tablet Take 1 tablet by mouth daily. 11/22/22   [provider]  nitroGLYCERIN (NITROSTAT) 0.4 MG SL tablet Place 0.4 mg under the tongue every 5 (five) minutes as needed for chest pain. Patient not taking: Reported on 05/09/2023 12/19/22   [provider]  olmesartan (BENICAR) 20 MG tablet Take 1 tablet (20 mg total) by mouth daily. 12/14/22   Modena Slater, DO  ondansetron (ZOFRAN-ODT) 4 MG disintegrating tablet Take by mouth. 05/25/23   [provider]  pantoprazole (PROTONIX) 20 MG tablet Take 20 mg by mouth daily. 04/19/23   [provider]    Inpatient Medications: Scheduled Meds:  aspirin  325 mg Oral Daily   atorvastatin  80 mg Oral Daily   calcium acetate  2,001 mg Oral TID with meals   carvedilol  12.5 mg Oral BID WC   famotidine  20 mg Oral Daily   insulin aspart  0-6 Units Subcutaneous Q6H   irbesartan  150 mg Oral Daily   pantoprazole (PROTONIX) IV  40 mg Intravenous Daily    Continuous Infusions:  heparin 1,200 Units/hr (06/25/23 0304)   nitroGLYCERIN 5 mcg/min (06/25/23 0657)   PRN Meds: acetaminophen **OR** acetaminophen, ondansetron (ZOFRAN) IV  Allergies:    Allergies  Allergen Reactions   Imdur [Isosorbide Nitrate] Other (See Comments)    Endorsed headache in the past - willing to retry    Social History:   Social History   Socioeconomic History   Marital status: Single    Spouse name: Not on file   Number of children: Not on file   Years of education: Not on file   Highest education level: Not on file  Occupational History   Not on file  Tobacco Use   Smoking status: Never   Smokeless tobacco: Never  Vaping Use   Vaping status: Never Used  Substance and Sexual Activity   Alcohol use: Not Currently   Drug use: Not Currently    Types: Marijuana   Sexual activity: Not on file  Other Topics Concern   Not on file  Social History Narrative   Not on file  Social Drivers of Corporate investment banker Strain: Not on file  Food Insecurity: No Food Insecurity (05/09/2023)   Hunger Vital Sign    Worried About Running Out of Food in the Last Year: Never true    Ran Out of Food in the Last Year: Never true  Recent Concern: Food Insecurity - Food Insecurity Present (02/13/2023)   Hunger Vital Sign    Worried About Running Out of Food in the Last Year: Sometimes true    Ran Out of Food in the Last Year: Sometimes true  Transportation Needs: Unmet Transportation Needs (05/09/2023)   PRAPARE - Administrator, Civil Service (Medical): Yes    Lack of Transportation (Non-Medical): Yes  Physical Activity: Not on file  Stress: Not on file  Social Connections: Not on file  Intimate Partner Violence: Not At Risk (05/09/2023)   Humiliation, Afraid, Rape, and Kick questionnaire    Fear of Current or Ex-Partner: No    Emotionally Abused: No    Physically Abused: No    Sexually Abused: No    Family History:    Family History   Problem Relation Age of Onset   CAD Mother    Hypertension Mother    Diabetes Neg Hx    Stroke Neg Hx    Cancer Neg Hx    Kidney failure Neg Hx    Stomach cancer Neg Hx    Colon cancer Neg Hx    Rectal cancer Neg Hx      ROS:  Please see the history of present illness.   All other ROS reviewed and negative.     Physical Exam/Data:   Vitals:   06/25/23 0550 06/25/23 0600 06/25/23 0655 06/25/23 0700  BP:  (!) 112/100 (!) 115/99 (!) 109/49  Pulse:  90 86 84  Resp:  17 11 11   Temp: 98 F (36.7 C)     TempSrc:      SpO2:  97% 100% 100%  Weight:      Height:       No intake or output data in the 24 hours ending 06/25/23 0744    06/25/2023    1:57 AM 06/27/23    6:33 AM 05/11/2023    6:01 PM  Last 3 Weights  Weight (lbs) 216 lb 216 lb 0.8 oz 213 lb 13.5 oz  Weight (kg) 97.977 kg 98 kg 97 kg     Body mass index is 28.5 kg/m.  General:  Well nourished, well developed, in no acute distress HEENT: normal Neck: no JVD Vascular: No carotid bruits; Distal pulses 2+ bilaterally Cardiac:  normal S1, S2; RRR; no murmur. RUE AV fistula Lungs: Left basilar Rales Abd: soft, nontender, no hepatomegaly  Ext: no edema Musculoskeletal:  No deformities, BUE and BLE strength normal and equal Skin: warm and dry  Neuro:  CNs 2-12 intact, no focal abnormalities noted Psych:  Normal affect   EKG:  The EKG was personally reviewed and demonstrates:   EKG 06/25/2023: Sinus rhythm 93 bpm RBBB, LAFB LVH  Inferolateral ST depression more pronounced since June 27, 2023  Telemetry:  Telemetry was personally reviewed and demonstrates: No significant arrhythmia seen  Relevant CV Studies: Independently interpreted EKG 06/25/2023: Sinus rhythm 93 bpm RBBB, LAFB LVH  Inferolateral ST depression more pronounced since 06-27-2023  Independently interpreted TEE 05/10/2023: Mitral calcification, no mitral valve mass.  Mild MR. EF 55 to 60% Mild LA dilatation Moderate grade 3 protruding  plaque involving aortic arch and descending aorta Negative bubble study  Independently interpreted Coronary angiography 12/13/2022: Dist LM to Prox LAD lesion is 25% stenosed. CSA by IVUS 8.1 mm. Proximal LAD 50% stenosis, CSA by IVUS 6.2 mm Mid LAD 25% stenosis Ostial left circumflex 25% stenosis  Laboratory Data:  High Sensitivity Troponin:   Recent Labs  Lab 06/01/23 0240 06/01/23 0515 06/01/23 0650 06/25/23 0203 06/25/23 0448  TROPONINIHS 133* 181* 251* 860* 4,749*     Chemistry Recent Labs  Lab 06/25/23 0203 06/25/23 0208 06/25/23 0448  NA 140 139  140 140  K 4.5 4.4  4.4 4.8  CL 97* 99 98  CO2 24  --  25  GLUCOSE 156* 155* 175*  BUN 44* 44* 45*  CREATININE 14.45* 15.40* 14.50*  CALCIUM 9.3  --  8.7*  MG  --   --  2.0  GFRNONAA 4*  --  4*  ANIONGAP 19*  --  17*    Recent Labs  Lab 06/25/23 0203  PROT 8.3*  ALBUMIN 3.6  AST 25  ALT 15  ALKPHOS 79  BILITOT 0.7   Lipids No results for input(s): "CHOL", "TRIG", "HDL", "LABVLDL", "LDLCALC", "CHOLHDL" in the last 168 hours.  Hematology Recent Labs  Lab 06/25/23 0203 06/25/23 0208  WBC 9.6  --   RBC 3.98*  --   HGB 12.8* 13.3  12.9*  HCT 38.7* 39.0  38.0*  MCV 97.2  --   MCH 32.2  --   MCHC 33.1  --   RDW 16.5*  --   PLT 177  --    Thyroid No results for input(s): "TSH", "FREET4" in the last 168 hours.  BNP Recent Labs  Lab 06/25/23 0203 06/25/23 0448  BNP 620.3* 480.0*    DDimer No results for input(s): "DDIMER" in the last 168 hours.   Radiology/Studies:  DG Chest Port 1 View Result Date: 06/25/2023 CLINICAL DATA:  cp EXAM: PORTABLE CHEST 1 VIEW COMPARISON:  CT angio chest 06/01/2023, chest x-ray 06/01/2023 FINDINGS: The heart and mediastinal contours are unchanged. Atherosclerotic plaque Interval development of patchy bilateral mid to lower lung zone airspace opacities. No pulmonary edema. No pleural effusion. No pneumothorax. No acute osseous abnormality. IMPRESSION: 1. Interval  development of patchy bilateral mid to lower lung zone airspace opacities. Followup PA and lateral chest X-ray is recommended in 3-4 weeks following therapy to ensure resolution. 2.  Aortic Atherosclerosis (ICD10-I70.0). Electronically Signed   By: Tish Frederickson M.D.   On: 06/25/2023 02:18     Assessment and Plan:   52 y/o male w/hypertension, type 2 diabetes mellitus, ESRD on HD, moderate CAD (cath 11/2022), admitted with chest pain, NSTEMI  NSTEMI: Inferolateral ST depression more pronounced compared to previous EKGs.  High sensitive troponin >4700. Continue aspirin-currently on 325 mg daily, can be adjusted post cath depending on findings. Continue IV heparin, IV nitroglycerin drip. Continue Lipitor 80 mg daily, Coreg 12.5 mg twice daily. Currently on IV nitroglycerin at 5 mics.  Attempts to increase nitroglycerin for pain control has resulted in hypotension as low as SBP in 70s. Plan to perform left heart catheterization coronary angiography today for possible intervention. Patient previously developed large groin hematoma with femoral access heart catheterization in 11/2022.  Therefore, patient specifically mention his preference for radial access. To the patient that his radial access options are limited due to presence of right upper AV fistula.  Generally, we try to avoid contralateral radial access in dialysis patients to preserve arterial revascularization for future AV fistula.  However, given patient's preference, we will consider left  radial access.  I have reviewed the risks, indications, and alternatives to cardiac catheterization, possible angioplasty, and stenting with the patient. Risks include but are not limited to bleeding, infection, vascular injury, stroke, myocardial infection, arrhythmia, kidney injury, radiation-related injury in the case of prolonged fluoroscopy use, emergency cardiac surgery, and death. The patient understands the risks of serious complication is 1-2 in  1000 with diagnostic cardiac cath and 1-2% or less with angioplasty/stenting.   Acute hypoxic respiratory failure: Currently on 3 L oxygen, maintaining 100% sats. Suspect there may be component of fluid overload, although clinical exam is unremarkable other than left basilar Rales. He will need dialysis after heart catheterization, either today or tomorrow.  Hypertension: Controlled on above meds, hold irbesartan for now.  Also on amlodipine at home, holding due to low normal blood pressure.  Type 2 diabetes mellitus: Management as per primary team  ESRD: On dialysis, will need nephrology   Risk Assessment/Risk Scores:     TIMI Risk Score for Unstable Angina or Non-ST Elevation MI:   The patient's TIMI risk score is 6, which indicates a 41% risk of all cause mortality, new or recurrent myocardial infarction or need for urgent revascularization in the next 14 days.       For questions or updates, please contact [facility] Please consult [email or web address] for contact info under    Signed, Elder Negus, MD  06/25/2023 7:44 AM

## 2023-06-25 NOTE — Progress Notes (Signed)
Patient arrived from the cath lab after heart cath using left radial access.  Access site level 0.  Telemetry monitor applied and CCMD notified.  CHG bath and skin assessment completed.  Patient oriented to unit and room to include call light and phone.  All needs addressed.

## 2023-06-25 NOTE — Progress Notes (Addendum)
[facility] PROGRESS NOTE    Progress Note  Corey Park  ZOX:[id number] DOB: July 15, 1970 DOA: 06/25/2023 PCP: [name], NP     Brief Narrative:   Corey Park is an 52 y.o. male past medical history significant for end-stage renal disease on hemodialysis Monday Wednesday and Friday, diabetes mellitus type 2, essential hypertension anemia of chronic disease with a baseline hemoglobin of 10-13, chronically elevated troponin in the setting of an stage renal disease coming to the hospital complaining of chest pain   Assessment/Plan:   Possible NSTEMI (non-ST elevated myocardial infarction)/Elevated troponin Chest pain with associated diaphoresis and shortness of breath dizziness. With positive cardiac biomarkers which have mote than doubled now at 4700, twelve-lead EKG positive for ACS with ST segment depression in 1 and aVF and V2 V5 and V6.   Started on IV heparin, nitroglycerin, beta-blocker, statins and aspirin. Cardiology was consulted who also recommended an echo. For possible cardiac cath on 06/25/2023  Acute respiratory failure with hypoxia: In the setting of NSTEMI placed on 3 L of oxygen. Chest x-ray showed bilateral airspace disease, has crackles on physical exam with an elevated BNP. He was start empirically on Rocephin and azithromycin in the ED he has remained afebrile with no leukocytosis will discontinue antibiotics.  Procalcitonin is low yield.  End-stage renal disease on hemodialysis [facility]) Renal has been consulted resume his home regimen.  DM2 (diabetes mellitus, type 2) (HCC) Patient is n.p.o., sliding scale insulin CBGs every 4.  Essential hypertension: Hold Norvasc, continue strict I's and O's and daily weights. Currently on Coreg and Avapro.  Anemia of chronic renal disease: Hemoglobin has remained relatively stable.  GERD: Continue PPI.   DVT prophylaxis: heparin Family Communication:none Status is: Inpatient Remains inpatient  appropriate because: ACS    Code Status:     Code Status Orders  (From admission, onward)           Start     Ordered   06/25/23 0331  Full code  Continuous       Question:  By:  Answer:  Consent: discussion documented in EHR   06/25/23 0331           Code Status History     Date Active Date Inactive Code Status Order ID Comments User Context   05/09/2023 1622 05/11/2023 2342 Full Code [id number]  Kathrynn Running, MD ED   04/02/2023 0335 04/03/2023 1747 Full Code [id number]  Zierle-Ghosh, Asia B, DO ED   01/03/2023 1439 01/04/2023 2146 Full Code [id number]  Gwenevere Abbot, MD Inpatient   12/11/2022 0952 12/14/2022 1954 Full Code [id number]  Modena Slater, DO ED   10/19/2022 1844 10/20/2022 1855 Full Code [id number]  Lorri Frederick, MD ED   09/12/2022 0831 09/13/2022 2141 Full Code [id number]  Fayette Pho, MD ED   08/05/2022 0558 08/08/2022 1507 Full Code [id number]  Augusto Gamble, MD ED   04/17/2022 0741 04/19/2022 2057 Full Code [id number]  Clydie Braun, MD ED   11/26/2019 2354 11/28/2019 2151 Full Code [id number]  Joycelyn Das, MD Inpatient   07/27/2019 2137 08/03/2019 1741 Full Code [id number]  Anselm Jungling, DO ED   12/21/2018 0514 12/24/2018 2231 Full Code [id number]  Rometta Emery, MD ED   06/16/2018 1904 06/19/2018 1610 Full Code [id number]  Ike Bene, MD ED   12/12/2016 0210 12/14/2016 1616 Full Code [id number]  Therisa Doyne, MD Inpatient         IV Access:   Peripheral IV   Procedures and diagnostic studies:  DG Chest Port 1 View Result Date: 06/25/2023 CLINICAL DATA:  cp EXAM: PORTABLE CHEST 1 VIEW COMPARISON:  CT angio chest 06/01/2023, chest x-ray 06/01/2023 FINDINGS: The heart and mediastinal contours are unchanged. Atherosclerotic plaque Interval development of patchy bilateral mid to lower lung zone airspace opacities. No pulmonary edema. No pleural effusion. No pneumothorax. No acute osseous abnormality. IMPRESSION: 1. Interval development of  patchy bilateral mid to lower lung zone airspace opacities. Followup PA and lateral chest X-ray is recommended in 3-4 weeks following therapy to ensure resolution. 2.  Aortic Atherosclerosis (ICD10-I70.0). Electronically Signed   By: Tish Frederickson M.D.   On: 06/25/2023 02:18     Medical Consultants:   None.   Subjective:    Corey Park relates his chest pain is improved but still some discomfort.  Objective:    Vitals:   06/25/23 0520 06/25/23 0530 06/25/23 0550 06/25/23 0600  BP: (!) 124/90 118/78  (!) 112/100  Pulse: 81 81  90  Resp: 19 (!) 24  17  Temp:   98 F (36.7 C)   TempSrc:      SpO2: 95% 100%  97%  Weight:      Height:       SpO2: 97 % O2 Flow Rate (L/min): 3 L/min  No intake or output data in the 24 hours ending 06/25/23 0636 Filed Weights   06/25/23 0157  Weight: 98 kg    Exam: General exam: In no acute distress. Respiratory system: Good air movement and clear to auscultation. Cardiovascular system: S1 & S2 heard, RRR. No JVD. Gastrointestinal system: Abdomen is nondistended, soft and nontender.  Extremities: No pedal edema. Skin: No rashes, lesions or ulcers Psychiatry: Judgement and insight appear normal. Mood & affect appropriate.    Data Reviewed:    Labs: Basic Metabolic Panel: Recent Labs  Lab 06/25/23 0203 06/25/23 0208 06/25/23 0448  NA 140 139  140 140  K 4.5 4.4  4.4 4.8  CL 97* 99 98  CO2 24  --  25  GLUCOSE 156* 155* 175*  BUN 44* 44* 45*  CREATININE 14.45* 15.40* 14.50*  CALCIUM 9.3  --  8.7*  MG  --   --  2.0  PHOS  --   --  7.5*   GFR Estimated Creatinine Clearance: 7.3 mL/min (A) (by C-G formula based on SCr of 14.5 mg/dL (H)). Liver Function Tests: Recent Labs  Lab 06/25/23 0203  AST 25  ALT 15  ALKPHOS 79  BILITOT 0.7  PROT 8.3*  ALBUMIN 3.6   No results for input(s): "LIPASE", "AMYLASE" in the last 168 hours. No results for input(s): "AMMONIA" in the last 168 hours. Coagulation profile No  results for input(s): "INR", "PROTIME" in the last 168 hours. COVID-19 Labs  No results for input(s): "DDIMER", "FERRITIN", "LDH", "CRP" in the last 72 hours.  Lab Results  Component Value Date   SARSCOV2NAA NEGATIVE 04/02/2023   SARSCOV2NAA NEGATIVE 01/03/2023   SARSCOV2NAA NEGATIVE 09/12/2022   SARSCOV2NAA NEGATIVE 04/17/2022    CBC: Recent Labs  Lab 06/25/23 0203 06/25/23 0208  WBC 9.6  --   NEUTROABS 6.6  --   HGB 12.8* 13.3  12.9*  HCT 38.7* 39.0  38.0*  MCV 97.2  --   PLT 177  --    Cardiac Enzymes: No results for input(s): "CKTOTAL", "CKMB", "CKMBINDEX", "TROPONINI" in the last 168 hours. BNP (last 3 results) No results for input(s): "PROBNP" in the last 8760 hours. CBG: Recent Labs  Lab 06/25/23 0531  GLUCAP  152*   D-Dimer: No results for input(s): "DDIMER" in the last 72 hours. Hgb A1c: No results for input(s): "HGBA1C" in the last 72 hours. Lipid Profile: No results for input(s): "CHOL", "HDL", "LDLCALC", "TRIG", "CHOLHDL", "LDLDIRECT" in the last 72 hours. Thyroid function studies: No results for input(s): "TSH", "T4TOTAL", "T3FREE", "THYROIDAB" in the last 72 hours.  Invalid input(s): "FREET3" Anemia work up: No results for input(s): "VITAMINB12", "FOLATE", "FERRITIN", "TIBC", "IRON", "RETICCTPCT" in the last 72 hours. Sepsis Labs: Recent Labs  Lab 06/25/23 0203 06/25/23 0448  PROCALCITON  --  0.40  WBC 9.6  --    Microbiology No results found for this or any previous visit (from the past 240 hours).   Medications:    atorvastatin  80 mg Oral Daily   calcium acetate  2,001 mg Oral TID with meals   carvedilol  12.5 mg Oral BID WC   famotidine  20 mg Oral Daily   insulin aspart  0-6 Units Subcutaneous Q6H   irbesartan  150 mg Oral Daily   pantoprazole (PROTONIX) IV  40 mg Intravenous Daily   Continuous Infusions:  azithromycin     cefTRIAXone (ROCEPHIN)  IV     heparin 1,200 Units/hr (06/25/23 0304)   nitroGLYCERIN 10 mcg/min  (06/25/23 0520)      LOS: 0 days   Marinda Elk  [facility]  06/25/2023, 6:36 AM

## 2023-06-25 NOTE — Progress Notes (Signed)
Echocardiogram 2D Echocardiogram has been performed.  Corey Park Corey Park 06/25/2023, 8:46 AM

## 2023-06-25 NOTE — H&P (Signed)
History and Physical      Corey Park ZOX:[id number] DOB: 1970/10/28 DOA: 06/25/2023; DOS: 06/25/2023  PCP: [name], NP  Patient coming from: home   I have personally briefly reviewed patient's old medical records in [facility]  Chief Complaint: chest pain  HPI: Corey Park is a 52 y.o. male with medical history significant for end-stage renal disease on hemodialysis on Monday, Wednesday, Friday schedule, type 2 diabetes mellitus, essential hypertension, anemia of chronic kidney disease associated baseline hemoglobin 10-13, chronically elevated troponin, who is admitted to [facility] on 06/25/2023 with NSTEMI after presenting from home to [facility] ED complaining of chest pain.   The patient reports that he awoke from sleep just prior to presenting to the emergency department this evening with new onset nonradiating substernal pressure associated with new onset shortness of breath, diaphoresis, dizziness.  Denies associated palpitations, nausea, vomiting, nor any ensuing syncope or fall.  In the setting of the symptoms, he contacted EMS, who noted the patient's initial oxygen saturations to be in the mid 80s on room air, Sosan improving into the high 90s to 100% on 3 L nasal cannula.  This is relative to no known baseline supplemental oxygen requirements.  EMS provided the patient with full dose aspirin x 1 as well as sublingual nitroglycerin x 2 doses, with no significant improvement in the intensity of his chest discomfort with these 2 doses of nitroglycerin.  The patient was subsequent brought to [facility] emergency department for further evaluation management thereof.  Denies any recent hemoptysis, nor any recent lower extremity erythema or calf tenderness.  Denies any recent subjective fever, chills, rigors, or generalized myalgias.  He has a history of essential pretension as well as type 2 diabetes mellitus.  Per chart review, he also has a history of chronically  elevated troponin, with baseline troponin range 50-250.  Additionally, per chart review, it appears that he underwent left-sided heart cath in June 2024.  Most recent complete TTE occurred on 04/03/2023 was notable for LVEF 55 to 60%, global left ventricular hypokinesis, mild concentric LVH, indeterminate diastolic parameters, and normal right ventricular systolic function.    ED Course:  Vital signs in the ED were notable for the following: Afebrile; heart rates in the 80s to 90s; systolic blood pressures in the 140s to 160s; respiratory rate 17-24.  As noted above, EMS noted the patient's initial oxygen saturation to be in the mid 80s on room air, with ensuing improvement to 100% on 3 L nasal cannula.  Labs were notable for the following: CMP was notable for the following: Sodium 140, potassium 4.5, bicarbonate 24, glucose 156, and liver enzymes were within normal limits.  High-sensitivity troponin I initially was 860 compared to most recent prior value of 251 on 06/01/2023, with repeat value currently pending.  BNP 620 compared to 282 on 02/09/2023.  CBC notable for white cell count 9600, hemoglobin 12.8 compared to 11.4 on 06/01/2023, platelet count 177.  Per my interpretation, EKG in ED demonstrated the following: In comparison to most recent prior EKG from 06/01/2023, today's EKG shows sinus rhythm with bifascicular block, heart rate 95, nonspecific T wave inversion in aVL, which appears unchanged from most recent prior EKG, while showing ST elevation limited to aVR, which appears more pronounced relative to ST elevation in aVR noted on prior EKG, will also showing ST depression in leads II, aVF, V4 through V6, of which ST depression in aVF and V4 appears new relative to most recent prior  EKG, We will ST depression in leads II V5, V6 it appears more pronounced than the ST depression noted in most recent prior EKG from 06/01/2023.  Imaging in the ED, per corresponding formal radiology read, was notable  for the following: 1 view chest x-ray, in comparison to most recent prior chest x-ray from 06/01/2023 shows interval development of patchy bilateral mid to lower lung lung zone airspace opacities, will demonstrate no evidence of pleural effusion or pneumothorax.  EDP d/w on-call cardiology fellow, Dr. Granville Lewis, who will formally consult. Dr. Granville Lewis recommends heparin drip, nitroglycerin drip, and conveys anticipation of taking the patient for heart cath later today.   While in the ED, the following were administered: Heparin bolus followed by initiation of heparin drip.  Nitroglycerin drip.  Azithromycin, Rocephin.  Subsequently, the patient was admitted for further evaluation management of presenting NSTEMI in the setting of elevated troponin, chest pain, complicated by acute hypoxic respiratory distress and finding of patchy bilateral airspace opacities on presenting chest x-ray.    Review of Systems: As per HPI otherwise 10 point review of systems negative.   Past Medical History:  Diagnosis Date   Allergy, unspecified, initial encounter 08/25/2019   Asthma    as a child   Cellulitis, perineum 11/26/2019   CHF (congestive heart failure) (HCC) 2021   Complication of vascular dialysis catheter 08/25/2019   COVID-19 virus infection 07/27/2019   Diabetes mellitus without complication (HCC)    type 2   Dilated cardiomyopathy (HCC) 07/03/2018   ESRD on hemodialysis (HCC) 06/17/2018   MWF at [facility]   Fe deficiency anemia 12/23/2018   Hypertension    Legally blind    B/L   Pneumonia 2021   Type 2 diabetes mellitus with diabetic peripheral angiopathy without gangrene (HCC) 08/25/2019   Unspecified protein-calorie malnutrition (HCC) 08/25/2019    Past Surgical History:  Procedure Laterality Date   BASCILIC VEIN TRANSPOSITION Right 10/16/2019   Procedure: Basilic Vein Transposition Right Arm;  Surgeon: Nada Libman, MD;  Location: [facility] OR;  Service: Vascular;  Laterality: Right;   BASCILIC  VEIN TRANSPOSITION Right 01/29/2020   Procedure: RIGHT ARM SECOND STAGE BASCILIC VEIN TRANSPOSITION;  Surgeon: Nada Libman, MD;  Location: [facility] OR;  Service: Vascular;  Laterality: Right;   BIOPSY  12/22/2018   Procedure: BIOPSY;  Surgeon: Carman Ching, MD;  Location: [facility] ENDOSCOPY;  Service: Endoscopy;;   CORONARY ULTRASOUND/IVUS N/A 12/13/2022   Procedure: Coronary Ultrasound/IVUS;  Surgeon: Corky Crafts, MD;  Location: [facility] INVASIVE CV LAB;  Service: Cardiovascular;  Laterality: N/A;   ESOPHAGOGASTRODUODENOSCOPY N/A 12/22/2018   Procedure: ESOPHAGOGASTRODUODENOSCOPY (EGD);  Surgeon: Carman Ching, MD;  Location: [facility] ENDOSCOPY;  Service: Endoscopy;  Laterality: N/A;   IR FLUORO GUIDE CV LINE RIGHT  07/29/2019   IR US GUIDE VASC ACCESS RIGHT  07/29/2019   LEFT HEART CATH AND CORONARY ANGIOGRAPHY N/A 12/13/2022   Procedure: LEFT HEART CATH AND CORONARY ANGIOGRAPHY;  Surgeon: Corky Crafts, MD;  Location: [facility] INVASIVE CV LAB;  Service: Cardiovascular;  Laterality: N/A;   TRANSESOPHAGEAL ECHOCARDIOGRAM (CATH LAB) N/A 05/10/2023   Procedure: TRANSESOPHAGEAL ECHOCARDIOGRAM;  Surgeon: Sande Rives, MD;  Location: [facility] INVASIVE CV LAB;  Service: Cardiovascular;  Laterality: N/A;    Social History:  reports that he has never smoked. He has never used smokeless tobacco. He reports that he does not currently use alcohol. He reports that he does not currently use drugs after having used the following drugs: Marijuana.   Allergies  Allergen Reactions  Imdur [Isosorbide Nitrate] Other (See Comments)    Endorsed headache in the past - willing to retry    Family History  Problem Relation Age of Onset   CAD Mother    Hypertension Mother    Diabetes Neg Hx    Stroke Neg Hx    Cancer Neg Hx    Kidney failure Neg Hx    Stomach cancer Neg Hx    Colon cancer Neg Hx    Rectal cancer Neg Hx     Family history reviewed and not pertinent    Prior to Admission medications   Medication  Sig Start Date End Date Taking? Authorizing Provider  amLODipine (NORVASC) 5 MG tablet Take 1 tablet (5 mg total) by mouth daily. 05/12/23 10/09/23 Yes Jerald Kief, MD  atorvastatin (LIPITOR) 80 MG tablet Take 1 tablet (80 mg total) by mouth daily. 12/15/22   Modena Slater, DO  calcium acetate (PHOSLO) 667 MG capsule Take 2 capsules (1,334 mg total) by mouth with breakfast, with lunch, and with evening meal. Patient taking differently: Take 2,001 mg by mouth with breakfast, with lunch, and with evening meal. 12/14/22   Modena Slater, DO  carvedilol (COREG) 25 MG tablet Take 1 tablet (25 mg total) by mouth 2 (two) times daily with a meal. 12/14/22   Modena Slater, DO  famotidine (PEPCID) 20 MG tablet Take 20 mg by mouth daily. 04/20/23   [provider]  ferric citrate (AURYXIA) 1 GM 210 MG(Fe) tablet Take 2 tablets (420 mg total) by mouth daily. 12/14/22   Modena Slater, DO  gabapentin (NEURONTIN) 100 MG capsule Take 1 capsule (100 mg total) by mouth 2 (two) times daily. 04/03/23   Dorcas Carrow, MD  multivitamin (RENA-VIT) TABS tablet Take 1 tablet by mouth daily. 11/22/22   [provider]  nitroGLYCERIN (NITROSTAT) 0.4 MG SL tablet Place 0.4 mg under the tongue every 5 (five) minutes as needed for chest pain. Patient not taking: Reported on 05/09/2023 12/19/22   [provider]  olmesartan (BENICAR) 20 MG tablet Take 1 tablet (20 mg total) by mouth daily. 12/14/22   Modena Slater, DO  ondansetron (ZOFRAN-ODT) 4 MG disintegrating tablet Take by mouth. 05/25/23   [provider]  pantoprazole (PROTONIX) 20 MG tablet Take 20 mg by mouth daily. 04/19/23   [provider]     Objective    Physical Exam: Vitals:   06/25/23 0255 06/25/23 0300 06/25/23 0345 06/25/23 0355  BP: (!) 154/95 (!) 153/96 (!) 140/123 (!) 140/93  Pulse: 85 83 80 82  Resp: (!) 24 17 19 16   Temp:      TempSrc:      SpO2: 100% 100% 100% 93%  Weight:      Height:        General: appears  to be stated age; alert, oriented Skin: warm, dry, no rash Head:  AT/[place] Mouth:  Oral mucosa membranes appear moist, normal dentition Neck: supple; trachea midline Heart:  RRR; did not appreciate any M/R/G Lungs: CTAB, did not appreciate any wheezes, rales, or rhonchi Abdomen: + BS; soft, ND, NT Vascular: 2+ pedal pulses b/l; 2+ radial pulses b/l Extremities: no peripheral edema, no muscle wasting Neuro: strength and sensation intact in upper and lower extremities b/l     Labs on Admission: I have personally reviewed following labs and imaging studies  CBC: Recent Labs  Lab 06/25/23 0203 06/25/23 0208  WBC 9.6  --   NEUTROABS 6.6  --   HGB 12.8* 13.3  12.9*  HCT 38.7* 39.0  38.0*  MCV 97.2  --   PLT 177  --    Basic Metabolic Panel: Recent Labs  Lab 06/25/23 0203 06/25/23 0208  NA 140 139  140  K 4.5 4.4  4.4  CL 97* 99  CO2 24  --   GLUCOSE 156* 155*  BUN 44* 44*  CREATININE 14.45* 15.40*  CALCIUM 9.3  --    GFR: Estimated Creatinine Clearance: 6.9 mL/min (A) (by C-G formula based on SCr of 15.4 mg/dL (H)). Liver Function Tests: Recent Labs  Lab 06/25/23 0203  AST 25  ALT 15  ALKPHOS 79  BILITOT 0.7  PROT 8.3*  ALBUMIN 3.6   No results for input(s): "LIPASE", "AMYLASE" in the last 168 hours. No results for input(s): "AMMONIA" in the last 168 hours. Coagulation Profile: No results for input(s): "INR", "PROTIME" in the last 168 hours. Cardiac Enzymes: No results for input(s): "CKTOTAL", "CKMB", "CKMBINDEX", "TROPONINI" in the last 168 hours. BNP (last 3 results) No results for input(s): "PROBNP" in the last 8760 hours. HbA1C: No results for input(s): "HGBA1C" in the last 72 hours. CBG: No results for input(s): "GLUCAP" in the last 168 hours. Lipid Profile: No results for input(s): "CHOL", "HDL", "LDLCALC", "TRIG", "CHOLHDL", "LDLDIRECT" in the last 72 hours. Thyroid Function Tests: No results for input(s): "TSH", "T4TOTAL", "FREET4", "T3FREE",  "THYROIDAB" in the last 72 hours. Anemia Panel: No results for input(s): "VITAMINB12", "FOLATE", "FERRITIN", "TIBC", "IRON", "RETICCTPCT" in the last 72 hours. Urine analysis:    Component Value Date/Time   COLORURINE YELLOW 04/17/2022 1916   APPEARANCEUR CLEAR 04/17/2022 1916   LABSPEC 1.015 04/17/2022 1916   PHURINE 7.0 04/17/2022 1916   GLUCOSEU 50 (A) 04/17/2022 1916   HGBUR SMALL (A) 04/17/2022 1916   BILIRUBINUR NEGATIVE 04/17/2022 1916   KETONESUR NEGATIVE 04/17/2022 1916   PROTEINUR 100 (A) 04/17/2022 1916   NITRITE NEGATIVE 04/17/2022 1916   LEUKOCYTESUR TRACE (A) 04/17/2022 1916    Radiological Exams on Admission: DG Chest Port 1 View Result Date: 06/25/2023 CLINICAL DATA:  cp EXAM: PORTABLE CHEST 1 VIEW COMPARISON:  CT angio chest 06/01/2023, chest x-ray 06/01/2023 FINDINGS: The heart and mediastinal contours are unchanged. Atherosclerotic plaque Interval development of patchy bilateral mid to lower lung zone airspace opacities. No pulmonary edema. No pleural effusion. No pneumothorax. No acute osseous abnormality. IMPRESSION: 1. Interval development of patchy bilateral mid to lower lung zone airspace opacities. Followup PA and lateral chest X-ray is recommended in 3-4 weeks following therapy to ensure resolution. 2.  Aortic Atherosclerosis (ICD10-I70.0). Electronically Signed   By: Tish Frederickson M.D.   On: 06/25/2023 02:18      Assessment/Plan   Principal Problem:   NSTEMI (non-ST elevated myocardial infarction) (HCC) Active Problems:   Elevated troponin   End-stage renal disease on hemodialysis (HCC)   DM2 (diabetes mellitus, type 2) (HCC)   Essential hypertension   GERD (gastroesophageal reflux disease)   History of anemia due to chronic kidney disease   Acute hypoxic respiratory failure (HCC)   Chest pain     #) NSTEMI: Diagnosis on the basis of new onset substernal chest discomfort that is improving with nitroglycerin, and associated with diaphoresis,  shortness of breath, dizziness, with interval increase in troponin to 860 relative to his baseline chronic troponin elevation in the range of 50-250, along with new ST depression in V4 and aVF as well as more pronounced ST depression in leads II, V5, V6 relative to most recent prior EKG from 06/01/2023.  There is also more pronounced ST elevation in aVR, although no additional ST elevation is noted.  Consequently, criteria for STEMI is not met at this time.  Chest x-ray with interval development of patchy bilateral airspace opacities, as above.  He has multiple risk factors for CAD, including hypertension, type 2 diabetes mellitus, and is also noted to have end-stage disease on hemodialysis.  Patient has residual chest pain at this time, it is improving with initiation of nitroglycerin drip in the ED this evening, and blood pressure appears to be tolerating this.  He is status post full dose aspirin x 1 administered by EMS this evening.  EDP d/w on-call cardiology fellow, Dr. Granville Lewis, who will formally consult. Dr. Granville Lewis recommends heparin drip, nitroglycerin drip, and conveys anticipation of taking the patient for heart cath later today.   Will also resume home beta-blocker, but at half dose due to increased risk for acutely decompensated heart failure given elevated BNP as well as new finding of patchy bilateral airspace opacities.  Will also resume home olmesartan as well as having atorvastatin, with the latter for plaque stabilization qualities..  Plan: Cardiology to formally consult, as above.  Continue nitroglycerin drip as well as heparin drip.  Resume home Coreg, but at half dose, as above.  Resume home high intensity atorvastatin, olmesartan.  Continue to trend troponin.  Echocardiogram in the morning.  Monitor on telemetry.  Add on serum magnesium level.  Strict n.p.o. after 5 AM in anticipation of cardiac cath later today.                     #) Acute hypoxic respiratory distress:  in the context of acute respiratory symptoms and no known baseline supplemental O2 requirements, presenting O2 sat was found to be in the mid 80s on room air, Sosan improving into the high 90s to 100% on 3 L nasal cannula, thereby meeting criteria for acute hypoxic respiratory distress as opposed to acute hypoxic respiratory failure at this time.  Suspect contribution from presenting NSTEMI, as further detailed above.  Of note, chest x-ray also shows interval development of patchy bilateral mid to lower lung zone airspace opacities.  Ashby Dawes of these radiographic findings is not entirely clear at this time.  Differential includes early acutely decompensated heart failure as a complication of presenting NSTEMI versus independent feature of mild volume overload in the setting of his end-stage renal disease on hemodialysis versus anemia.  Of note, he was started on azithromycin and Rocephin in the ED this evening.  For now, we will continue these antibiotics, and add on procalcitonin level to further correlate.  In the absence of leukocytosis and in the absence of objective fever, SIRS criteria not met for sepsis.  Clinically, presentation appears less suggestive of acute pulmonary embolism.  No known chronic underlying pulmonary pathology.  Chest x-ray shows no evidence of pneumothorax.  Plan: further evaluation/management of presenting NSTEMI, as above. monitor on telemetry. CMP/CBC in the AM. Check serum Mg and Phos levels.  Repeat BMP in the morning.  Add on procalcitonin level.  For now, continue azithromycin and Rocephin, as above.  Trend troponin.  Echocardiogram ordered for the morning.                    #) ESRD: on HD (schedule:, Wednesday, Friday).  While he is on a Monday, Wednesday, Friday hemodialysis session without any recently missed dialysis sessions, his most recent hemodialysis session occurred on Saturday, 06/23/2023 as a consequence of  the holiday schedule. no clinical  evidence for urgent overnight HD or to expedite HD relative to this timeframe, including no evidence of hyperkalemia or uremia.  On PhosLo as an outpatient  Plan: monitor strict I's/O's, daily weights. CMP in the AM. Check mag and phos levels.  Continue outpatient PhosLo.                     #) Type 2 Diabetes Mellitus: documented history of such.  Appears to be managed via lifestyle modifications in the absence of any outpatient use of insulin or oral hypoglycemic agents.  Most recent hemoglobin A1c was found to be 5.6% when checked in March 2024.  Presenting blood sugar 156.    Plan: In the setting of n.p.o. status, will pursue Accu-Cheks every 6 hours with associated very low-dose sliding scale insulin.  Add on hemoglobin A1c level.                     #) Essential Hypertension: documented h/o such, with outpatient antihypertensive regimen including Coreg, Norvasc, olmesartan. SBP's in the ED today: 140s to 160s mmHg. will closely monitor ensuing blood pressure, given increased risk for relative hypotension while on nitroglycerin drip for presenting chest pain.  As there is concern for potential early acutely decompensated heart failure in the setting of presenting chest x-ray showing evidence of patchy bilateral airspace opacities, will reduce dose of Coreg by half for now.  Plan: Close monitoring of subsequent BP via routine VS. resume home olmesartan.  Hold home Norvasc for now.  Monitor strict I's and O's and daily weights.                    #) Anemia of chronic kidney disease: Documented history of such, a/w with baseline hgb range 10-13, with presenting hgb consistent with this range, in the absence of any overt evidence of active bleed.     Plan: Repeat CBC in the morning.                     #) GERD: documented h/o such; on Pepcid as well as Protonix as outpatient.   Plan: continue home Pepcid.  In the setting of  n.p.o. status, will transiently convert to daily oral Protonix to daily IV Protonix.     DVT prophylaxis: SCD's + heparin drip. Code Status: Full code Family Communication: none Disposition Plan: Per Rounding Team Consults called: EDP has d/w on-call cardiology fellow, who will formally consult, as further detailed above;  Admission status: Inpatient     I SPENT GREATER THAN 75  MINUTES IN CLINICAL CARE TIME/MEDICAL DECISION-MAKING IN COMPLETING THIS ADMISSION.      Chaney Born Amr Sturtevant DO [facility]  From 7PM - 7AM   06/25/2023, 4:13 AM

## 2023-06-25 NOTE — Plan of Care (Signed)
  Problem: Education: Goal: Ability to describe self-care measures that may prevent or decrease complications (Diabetes Survival Skills Education) will improve Outcome: Progressing   Problem: Coping: Goal: Ability to adjust to condition or change in health will improve Outcome: Progressing   Problem: Fluid Volume: Goal: Ability to maintain a balanced intake and output will improve Outcome: Progressing   Problem: Health Behavior/Discharge Planning: Goal: Ability to manage health-related needs will improve Outcome: Progressing   Problem: Metabolic: Goal: Ability to maintain appropriate glucose levels will improve Outcome: Progressing   Problem: Nutritional: Goal: Progress toward achieving an optimal weight will improve Outcome: Progressing   Problem: Skin Integrity: Goal: Risk for impaired skin integrity will decrease Outcome: Progressing   Problem: Activity: Goal: Ability to return to baseline activity level will improve Outcome: Progressing   Problem: Cardiovascular: Goal: Ability to achieve and maintain adequate cardiovascular perfusion will improve Outcome: Progressing   Problem: Education: Goal: Knowledge of General Education information will improve Description: Including pain rating scale, medication(s)/side effects and non-pharmacologic comfort measures Outcome: Progressing   Problem: Clinical Measurements: Goal: Respiratory complications will improve Outcome: Progressing

## 2023-06-25 NOTE — ED Notes (Signed)
Patient to cath lab with RN on continuous cardiac and pulse oximetry monitoring with defibrillator and defibrillator pads in place. Report given to cath lab at bedside.

## 2023-06-25 NOTE — Interval H&P Note (Signed)
History and Physical Interval Note:  06/25/2023 9:33 AM  Corey Park  has presented today for surgery, with the diagnosis of nstemi.  The various methods of treatment have been discussed with the patient and family. After consideration of risks, benefits and other options for treatment, the patient has consented to  Procedure(s): LEFT HEART CATH AND CORONARY ANGIOGRAPHY (N/A) as a surgical intervention.  The patient's history has been reviewed, patient examined, no change in status, stable for surgery.  I have reviewed the patient's chart and labs.  Questions were answered to the patient's satisfaction.    Cath Lab Visit (complete for each Cath Lab visit)  Clinical Evaluation Leading to the Procedure:   ACS: Yes.    Non-ACS:    Anginal Classification: CCS III  Anti-ischemic medical therapy: Maximal Therapy (2 or more classes of medications)  Non-Invasive Test Results: No non-invasive testing performed  Prior CABG: No previous CABG        Corey Park

## 2023-06-25 NOTE — ED Notes (Signed)
Unable to obtain pulse ox reading

## 2023-06-26 ENCOUNTER — Encounter (HOSPITAL_COMMUNITY): Payer: Self-pay | Admitting: Cardiovascular Disease

## 2023-06-26 ENCOUNTER — Other Ambulatory Visit (HOSPITAL_COMMUNITY): Payer: Medicare Other

## 2023-06-26 DIAGNOSIS — I214 Non-ST elevation (NSTEMI) myocardial infarction: Secondary | ICD-10-CM | POA: Diagnosis not present

## 2023-06-26 LAB — BASIC METABOLIC PANEL
Anion gap: 14 (ref 5–15)
BUN: 48 mg/dL — ABNORMAL HIGH (ref 6–20)
CO2: 25 mmol/L (ref 22–32)
Calcium: 8.8 mg/dL — ABNORMAL LOW (ref 8.9–10.3)
Chloride: 98 mmol/L (ref 98–111)
Creatinine, Ser: 16.34 mg/dL — ABNORMAL HIGH (ref 0.61–1.24)
GFR, Estimated: 3 mL/min — ABNORMAL LOW (ref >60)
Glucose, Bld: 96 mg/dL (ref 70–99)
Potassium: 4.7 mmol/L (ref 3.5–5.1)
Sodium: 137 mmol/L (ref 135–145)

## 2023-06-26 LAB — GLUCOSE, CAPILLARY
Glucose-Capillary: 84 mg/dL (ref 70–99)
Glucose-Capillary: 125 mg/dL — ABNORMAL HIGH (ref 70–99)
Glucose-Capillary: 92 mg/dL (ref 70–99)
Glucose-Capillary: 190 mg/dL — ABNORMAL HIGH (ref 70–99)

## 2023-06-26 LAB — CBC
HCT: 31.1 % — ABNORMAL LOW (ref 39.0–52.0)
Hemoglobin: 10.4 g/dL — ABNORMAL LOW (ref 13.0–17.0)
MCH: 32 pg (ref 26.0–34.0)
MCHC: 33.4 g/dL (ref 30.0–36.0)
MCV: 95.7 fL (ref 80.0–100.0)
Platelets: 137 10*3/uL — ABNORMAL LOW (ref 150–400)
RBC: 3.25 MIL/uL — ABNORMAL LOW (ref 4.22–5.81)
RDW: 16.6 % — ABNORMAL HIGH (ref 11.5–15.5)
WBC: 8 10*3/uL (ref 4.0–10.5)
nRBC: 0 % (ref 0.0–0.2)

## 2023-06-26 LAB — HEPATITIS B SURFACE ANTIGEN: Hepatitis B Surface Ag: NONREACTIVE

## 2023-06-26 LAB — HEPARIN LEVEL (UNFRACTIONATED)
Heparin Unfractionated: 0.25 [IU]/mL — ABNORMAL LOW (ref 0.30–0.70)
Heparin Unfractionated: 0.38 [IU]/mL (ref 0.30–0.70)

## 2023-06-26 LAB — HEMOGLOBIN A1C
Hgb A1c MFr Bld: 5.8 % — ABNORMAL HIGH (ref 4.8–5.6)
Mean Plasma Glucose: 120 mg/dL

## 2023-06-26 MED ORDER — CEFAZOLIN SODIUM-DEXTROSE 2-4 GM/100ML-% IV SOLN
2.0000 g | INTRAVENOUS | Status: DC
Start: 2023-06-28 — End: 2023-06-29
  Filled 2023-06-26: qty 100

## 2023-06-26 MED ORDER — VANCOMYCIN HCL 1.5 G IV SOLR
1500.0000 mg | INTRAVENOUS | Status: AC
  Administered 2023-06-28: 1500 mg via INTRAVENOUS
  Filled 2023-06-26: qty 30

## 2023-06-26 MED ORDER — PLASMA-LYTE A IV SOLN
INTRAVENOUS | Status: DC
Start: 2023-06-28 — End: 2023-06-29
  Filled 2023-06-26: qty 5

## 2023-06-26 MED ORDER — MELATONIN 5 MG PO TABS
5.0000 mg | ORAL_TABLET | Freq: Every evening | ORAL | Status: DC | PRN
  Administered 2023-06-26: 5 mg via ORAL
  Filled 2023-06-26: qty 1

## 2023-06-26 MED ORDER — MILRINONE LACTATE IN DEXTROSE 20-5 MG/100ML-% IV SOLN
0.3000 ug/kg/min | INTRAVENOUS | Status: DC
  Filled 2023-06-26: qty 100

## 2023-06-26 MED ORDER — TRANEXAMIC ACID (OHS) PUMP PRIME SOLUTION
2.0000 mg/kg | INTRAVENOUS | Status: DC
Start: 2023-06-28 — End: 2023-06-29
  Filled 2023-06-26: qty 1.96

## 2023-06-26 MED ORDER — POTASSIUM CHLORIDE 2 MEQ/ML IV SOLN
80.0000 meq | INTRAVENOUS | Status: DC
  Filled 2023-06-26: qty 40

## 2023-06-26 MED ORDER — EPINEPHRINE HCL 5 MG/250ML IV SOLN IN NS
0.0000 ug/min | INTRAVENOUS | Status: DC
  Filled 2023-06-26: qty 250

## 2023-06-26 MED ORDER — MANNITOL 20 % IV SOLN
INTRAVENOUS | Status: DC
  Filled 2023-06-26: qty 13

## 2023-06-26 MED ORDER — CHLORHEXIDINE GLUCONATE CLOTH 2 % EX PADS
6.0000 | MEDICATED_PAD | Freq: Every day | CUTANEOUS | Status: DC
  Administered 2023-06-26 – 2023-06-27 (×2): 6 via TOPICAL

## 2023-06-26 MED ORDER — CALCIUM CARBONATE ANTACID 500 MG PO CHEW
2.0000 | CHEWABLE_TABLET | Freq: Three times a day (TID) | ORAL | Status: DC | PRN
Start: 2023-06-26 — End: 2023-07-08
  Administered 2023-06-26 – 2023-07-08 (×2): 400 mg via ORAL
  Filled 2023-06-26 (×4): qty 2

## 2023-06-26 MED ORDER — DEXMEDETOMIDINE HCL IN NACL 400 MCG/100ML IV SOLN
0.1000 ug/kg/h | INTRAVENOUS | Status: AC
Start: 2023-06-28 — End: 2023-06-29
  Administered 2023-06-28: .4 ug/kg/h via INTRAVENOUS
  Filled 2023-06-26: qty 100

## 2023-06-26 MED ORDER — TRANEXAMIC ACID 1000 MG/10ML IV SOLN
1.5000 mg/kg/h | INTRAVENOUS | Status: AC
  Administered 2023-06-28: 1.5 mg/kg/h via INTRAVENOUS
  Filled 2023-06-26 (×2): qty 25

## 2023-06-26 MED ORDER — NITROGLYCERIN IN D5W 200-5 MCG/ML-% IV SOLN
2.0000 ug/min | INTRAVENOUS | Status: DC
  Filled 2023-06-26: qty 250

## 2023-06-26 MED ORDER — NOREPINEPHRINE 4 MG/250ML-% IV SOLN
0.0000 ug/min | INTRAVENOUS | Status: AC
Start: 2023-06-28 — End: 2023-06-29
  Administered 2023-06-28: 2 ug/min via INTRAVENOUS
  Filled 2023-06-26: qty 250

## 2023-06-26 MED ORDER — HEPARIN 30,000 UNITS/1000 ML (OHS) CELLSAVER SOLUTION
Status: DC
  Filled 2023-06-26: qty 1000

## 2023-06-26 MED ORDER — PHENYLEPHRINE HCL-NACL 20-0.9 MG/250ML-% IV SOLN
30.0000 ug/min | INTRAVENOUS | Status: AC
  Administered 2023-06-28: 20 ug/min via INTRAVENOUS
  Filled 2023-06-26: qty 250

## 2023-06-26 MED ORDER — CEFAZOLIN SODIUM-DEXTROSE 2-4 GM/100ML-% IV SOLN
2.0000 g | INTRAVENOUS | Status: AC
  Administered 2023-06-28 (×2): 2 g via INTRAVENOUS
  Filled 2023-06-26: qty 100

## 2023-06-26 MED ORDER — INSULIN REGULAR(HUMAN) IN NACL 100-0.9 UT/100ML-% IV SOLN
INTRAVENOUS | Status: AC
Start: 2023-06-28 — End: 2023-06-29
  Administered 2023-06-28: 1.5 [IU]/h via INTRAVENOUS
  Filled 2023-06-26: qty 100

## 2023-06-26 MED ORDER — TRANEXAMIC ACID (OHS) BOLUS VIA INFUSION
15.0000 mg/kg | INTRAVENOUS | Status: AC
  Administered 2023-06-28: 1470 mg via INTRAVENOUS
  Filled 2023-06-26: qty 1470

## 2023-06-26 MED FILL — Midazolam HCl Inj 5 MG/5ML (Base Equivalent): INTRAMUSCULAR | Qty: 1 | Status: AC

## 2023-06-26 NOTE — Progress Notes (Signed)
 Pt complaints of either slightly increased chest pain/indigestion or pain from coughing (pt was not sure which one it was). See MAR, symptoms improved shortly after meds administered/titrated. Will continue to monitor. Ezmae Speers S Samaya Boardley

## 2023-06-26 NOTE — Progress Notes (Signed)
 CARDIAC REHAB PHASE I      Pre-op OHS education including OHS booklet, OHS handout, IS use, mobility importance, home needs at discharge and sternal precautions/move in the tube reviewed. All questions and concerns addressed. Phoned pt mother [name] to discuss pre-op education, per pt request.  Will continue to follow.  [id number] Vaughn Asberry Hacking, RN BSN 06/26/2023 12:34 PM

## 2023-06-26 NOTE — Progress Notes (Signed)
   Patient Name: Corey Park Date of Encounter: 06/26/2023 [facility] Cardiologist: Oneil Parchment, MD   Interval Summary  .    Mild episodes of chest pain, overall remains comfortable. On heparin , NTG  Vital Signs .    Vitals:   06/25/23 2037 06/25/23 2310 06/26/23 0400 06/26/23 0806  BP:  97/63 129/77 (!) 116/50  Pulse: 92  93 83  Resp:   13 11  Temp: 98.2 F (36.8 C) 98.2 F (36.8 C) 98.1 F (36.7 C) 98 F (36.7 C)  TempSrc: Oral Oral Oral Oral  SpO2: 97%  97% 97%  Weight:      Height:        Intake/Output Summary (Last 24 hours) at 06/26/2023 1032 Last data filed at 06/26/2023 0746 Gross per 24 hour  Intake 480 ml  Output 175 ml  Net 305 ml      06/25/2023    1:57 AM 06/01/2023    6:33 AM 05/11/2023    6:01 PM  Last 3 Weights  Weight (lbs) 216 lb 216 lb 0.8 oz 213 lb 13.5 oz  Weight (kg) 97.977 kg 98 kg 97 kg      Telemetry/ECG    Sinus Rhythm - Personally Reviewed  Physical Exam .   GEN: No acute distress.   Neck: No JVD Cardiac: RRR, no murmurs, rubs, or gallops.  Respiratory: Clear to auscultation bilaterally. GI: Soft, nontender, non-distended  MS: No edema Skin: left radial cath site stable  Assessment & Plan .     52 y/o male w/hypertension, type 2 diabetes mellitus, ESRD on HD, moderate CAD (cath 11/2022), admitted with chest pain, NSTEMI  NSTEMI -- presented with chest pain and found to have elevated troponin with peak 89017. Underwent cardiac cath 12/31 with severe 3v CAD, seen by TCTS planned for CABG 1/2.  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

## 2023-06-26 NOTE — Progress Notes (Signed)
 PHARMACY - ANTICOAGULATION Pharmacy Consult for Heparin  Indication: chest pain/ACS Brief A/P: Heparin  level subtherapeutic Increase Heparin  rate  Allergies  Allergen Reactions   Imdur  [Isosorbide  Nitrate] Other (See Comments)    Endorsed headache in the past - willing to retry    Patient Measurements: Height: 6' 1 (185.4 cm) Weight: 98 kg (216 lb) IBW/kg (Calculated) : 79.9  Vital Signs: Temp: 98.1 F (36.7 C) (12/31 0400) Temp Source: Oral (12/31 0400) BP: 129/77 (12/31 0400) Pulse Rate: 93 (12/31 0400)  Labs: Recent Labs    06/25/23 0203 06/25/23 0208 06/25/23 0448 06/25/23 0813 06/26/23 0416  HGB 12.8* 13.3  12.9*  --   --  10.4*  HCT 38.7* 39.0  38.0*  --   --  31.1*  PLT 177  --   --   --  137*  HEPARINUNFRC  --   --   --   --  0.25*  CREATININE 14.45* 15.40* 14.50*  --  16.34*  TROPONINIHS 860*  --  4,749* 10,982*  --     Estimated Creatinine Clearance: 6.5 mL/min (A) (by C-G formula based on SCr of 16.34 mg/dL (H)).  Assessment: 52 y.o. male with CAD s/p cath for heparin   Goal of Therapy:  Heparin  level 0.3-0.7 units/ml Monitor platelets by anticoagulation protocol: Yes   Plan:  Increase Heparin  1400 units/hr Check heparin  level in 8 hours.   Dail Cordella Misty 06/26/2023,5:53 AM

## 2023-06-26 NOTE — Plan of Care (Signed)
  Problem: Nutritional: Goal: Maintenance of adequate nutrition will improve Outcome: Progressing   Problem: Skin Integrity: Goal: Risk for impaired skin integrity will decrease Outcome: Progressing   Problem: Activity: Goal: Ability to return to baseline activity level will improve Outcome: Progressing

## 2023-06-26 NOTE — Progress Notes (Signed)
 [facility] PROGRESS NOTE    Progress Note  Corey Park  FMW:[id number] DOB: 09/07/1970 DOA: 06/25/2023 PCP: Lorren Greig PARAS, NP     Brief Narrative:   Corey Park is an 52 y.o. male past medical history significant for end-stage renal disease on hemodialysis Monday Wednesday and Friday, diabetes mellitus type 2, essential hypertension anemia of chronic disease with a baseline hemoglobin of 10-13, chronically elevated troponin in the setting of an stage renal disease coming to the hospital complaining of chest pain   Assessment/Plan:   NSTEMI (non-ST elevated myocardial infarction)/Elevated troponin Chest pain with associated diaphoresis and shortness of breath dizziness. Left heart cath was done on 06/25/2023 that showed triple-vessel disease. Continue IV heparin , nitroglycerin , beta-blocker, statins and aspirin . CT surgery was consulted was evaluating the patient. Cardiology is on board.  Acute respiratory failure with hypoxia: Has been weaned to room air.  End-stage renal disease on hemodialysis [facility]) Renal has been consulted resume his home regimen.  DM2 (diabetes mellitus, type 2) (HCC) Currently on carb modified diet as required minimal insulin  continue CBGs before meals and at bedtime.  Essential hypertension: Hold Norvasc , continue strict I's and O's and daily weights. Currently on Coreg  and Avapro .  Anemia of chronic renal disease: Hemoglobin has remained relatively stable.  GERD: Continue PPI.   DVT prophylaxis: heparin  Family Communication:none Status is: Inpatient Remains inpatient appropriate because: ACS    Code Status:     Code Status Orders  (From admission, onward)           Start     Ordered   06/25/23 0331  Full code  Continuous       Question:  By:  Answer:  Consent: discussion documented in EHR   06/25/23 0331           Code Status History     Date Active Date Inactive Code Status Order ID Comments User  Context   05/09/2023 1622 05/11/2023 2342 Full Code [id number]  Kandis Devaughn Sayres, MD ED   04/02/2023 0335 04/03/2023 1747 Full Code [id number]  Zierle-Ghosh, Asia B, DO ED   01/03/2023 1439 01/04/2023 2146 Full Code [id number]  Fernand Prost, MD Inpatient   12/11/2022 0952 12/14/2022 1954 Full Code [id number]  Tobie Gaines, DO ED   10/19/2022 1844 10/20/2022 1855 Full Code [id number]  Homer Shams, MD ED   09/12/2022 0831 09/13/2022 2141 Full Code [id number]  Macario Craven, MD ED   08/05/2022 0558 08/08/2022 1507 Full Code [id number]  Waymond Sieving, MD ED   04/17/2022 0741 04/19/2022 2057 Full Code [id number]  Claudene Maximino LABOR, MD ED   11/26/2019 2354 11/28/2019 2151 Full Code [id number]  Sonjia Held, MD Inpatient   07/27/2019 2137 08/03/2019 1741 Full Code [id number]  Ricky Alfrieda DASEN, DO ED   12/21/2018 0514 12/24/2018 2231 Full Code [id number]  Sim Emery CROME, MD ED   06/16/2018 1904 06/19/2018 1610 Full Code [id number]  Viviana Elveria PARAS, MD ED   12/12/2016 0210 12/14/2016 1616 Full Code [id number]  Silvester Ales, MD Inpatient         IV Access:   Peripheral IV   Procedures and diagnostic studies:   CARDIAC CATHETERIZATION Result Date: 06/25/2023   Prox RCA lesion is 80% stenosed.   Ost Cx to Mid Cx lesion is 99% stenosed.   Prox LAD lesion is 95% stenosed.   Mid LAD lesion is 50% stenosed.   Mid LM to Ost LAD lesion is 50% stenosed. Severe three vessel CAD Moderate distal left main stenosis Severe, heavily  calcified proximal LAD stenosis Severe stenosis (likely ulcerated plaque) in the ostial/proximal Circumflex artery Severe stenosis in the proximal RCA (Moderate caliber co-dominant vessel) Elevated LV filling pressures Recommendations: I would recommend surgical revascularization with CABG in this diabetic male with multi-vessel CAD, ESRD on HD. Will consult CT surgery. Resume IV heparin  2 hours post sheath removal.   ECHOCARDIOGRAM COMPLETE Result Date: 06/25/2023    ECHOCARDIOGRAM REPORT    Patient Name:   Corey Park Date of Exam: 06/25/2023 Medical Rec #:  [id number]       Height:       73.0 in Accession #:    [id number]      Weight:       216.0 lb Date of Birth:  21-Nov-1970       BSA:          2.223 m Patient Age:    52 years        BP:           109/99 mmHg Patient Gender: M               HR:           82 bpm. Exam Location:  Inpatient Procedure: 2D Echo, 3D Echo, Cardiac Doppler, Color Doppler and Strain Analysis Indications:    Stroke  History:        Patient has prior history of Echocardiogram examinations, most                 recent 04/03/2023. Risk Factors:Diabetes, Hypertension and                 Dyslipidemia.  Sonographer:    Ozell Free Referring Phys: [id number] JUSTIN B HOWERTER  Sonographer Comments: Global longitudinal strain was attempted. IMPRESSIONS  1. Left ventricular ejection fraction, by estimation, is 40 to 45%. The left ventricle has mildly decreased function. The left ventricle demonstrates regional wall motion abnormalities (see scoring diagram/findings for description). Left ventricular diastolic parameters are consistent with Grade II diastolic dysfunction (pseudonormalization). GLS -8.6%.  2. Right ventricular systolic function is normal. The right ventricular size is normal.  3. The mitral valve is normal in structure. No evidence of mitral valve regurgitation. No evidence of mitral stenosis.  4. The aortic valve is normal in structure. Aortic valve regurgitation is not visualized. No aortic stenosis is present.  5. The inferior vena cava is normal in size with greater than 50% respiratory variability, suggesting right atrial pressure of 3 mmHg. FINDINGS  Left Ventricle: Left ventricular ejection fraction, by estimation, is 40 to 45%. The left ventricle has mildly decreased function. The left ventricle demonstrates regional wall motion abnormalities. The left ventricular internal cavity size was normal in size. There is no left ventricular hypertrophy. Left ventricular  diastolic parameters are consistent with Grade II diastolic dysfunction (pseudonormalization).  LV Wall Scoring: The inferior wall and posterior wall are hypokinetic. Right Ventricle: The right ventricular size is normal. No increase in right ventricular wall thickness. Right ventricular systolic function is normal. Left Atrium: Left atrial size was normal in size. Right Atrium: Right atrial size was normal in size. Pericardium: There is no evidence of pericardial effusion. Mitral Valve: The mitral valve is normal in structure. No evidence of mitral valve regurgitation. No evidence of mitral valve stenosis. Tricuspid Valve: The tricuspid valve is normal in structure. Tricuspid valve regurgitation is trivial. No evidence of tricuspid stenosis. Aortic Valve: The aortic valve is normal in structure. Aortic valve regurgitation is not visualized. No aortic stenosis  is present. Aortic valve mean gradient measures 5.0 mmHg. Aortic valve peak gradient measures 8.4 mmHg. Aortic valve area, by VTI measures 2.12 cm. Pulmonic Valve: The pulmonic valve was normal in structure. Pulmonic valve regurgitation is trivial. No evidence of pulmonic stenosis. Aorta: The aortic root is normal in size and structure. Venous: The inferior vena cava is normal in size with greater than 50% respiratory variability, suggesting right atrial pressure of 3 mmHg. IAS/Shunts: No atrial level shunt detected by color flow Doppler.  LEFT VENTRICLE PLAX 2D LVIDd:         5.60 cm      Diastology LVIDs:         4.60 cm      LV e' medial:    5.22 cm/s LV PW:         1.10 cm      LV E/e' medial:  17.9 LV IVS:        1.10 cm      LV e' lateral:   9.46 cm/s LVOT diam:     2.10 cm      LV E/e' lateral: 9.9 LV SV:         59 LV SV Index:   27 LVOT Area:     3.46 cm                              3D Volume EF: LV Volumes (MOD)            3D EF:        37 % LV vol d, MOD A2C: 147.0 ml LV EDV:       239 ml LV vol d, MOD A4C: 148.0 ml LV ESV:       151 ml LV vol s,  MOD A2C: 84.2 ml  LV SV:        88 ml LV vol s, MOD A4C: 91.0 ml LV SV MOD A2C:     62.8 ml LV SV MOD A4C:     148.0 ml LV SV MOD BP:      61.3 ml RIGHT VENTRICLE             IVC RV Basal diam:  4.00 cm     IVC diam: 2.00 cm RV S prime:     15.20 cm/s TAPSE (M-mode): 2.3 cm LEFT ATRIUM             Index        RIGHT ATRIUM           Index LA diam:        3.90 cm 1.75 cm/m   RA Area:     12.80 cm LA Vol (A2C):   85.6 ml 38.50 ml/m  RA Volume:   32.70 ml  14.71 ml/m LA Vol (A4C):   46.1 ml 20.74 ml/m LA Biplane Vol: 64.4 ml 28.97 ml/m  AORTIC VALVE AV Area (Vmax):    2.25 cm AV Area (Vmean):   2.10 cm AV Area (VTI):     2.12 cm AV Vmax:           145.00 cm/s AV Vmean:          101.000 cm/s AV VTI:            0.279 m AV Peak Grad:      8.4 mmHg AV Mean Grad:      5.0 mmHg LVOT Vmax:  94.30 cm/s LVOT Vmean:        61.300 cm/s LVOT VTI:          0.171 m LVOT/AV VTI ratio: 0.61  AORTA Ao Root diam: 3.60 cm Ao Asc diam:  3.20 cm MITRAL VALVE MV Area (PHT): 4.57 cm    SHUNTS MV Decel Time: 166 msec    Systemic VTI:  0.17 m MV E velocity: 93.30 cm/s  Systemic Diam: 2.10 cm MV A velocity: 72.80 cm/s MV E/A ratio:  1.28 Aditya Sabharwal Electronically signed by Ria Commander Signature Date/Time: 06/25/2023/9:17:22 AM    Final    DG Chest Port 1 View Result Date: 06/25/2023 CLINICAL DATA:  cp EXAM: PORTABLE CHEST 1 VIEW COMPARISON:  CT angio chest 06/01/2023, chest x-ray 06/01/2023 FINDINGS: The heart and mediastinal contours are unchanged. Atherosclerotic plaque Interval development of patchy bilateral mid to lower lung zone airspace opacities. No pulmonary edema. No pleural effusion. No pneumothorax. No acute osseous abnormality. IMPRESSION: 1. Interval development of patchy bilateral mid to lower lung zone airspace opacities. Followup PA and lateral chest X-ray is recommended in 3-4 weeks following therapy to ensure resolution. 2.  Aortic Atherosclerosis (ICD10-I70.0). Electronically Signed   By:  Morgane  Naveau M.D.   On: 06/25/2023 02:18     Medical Consultants:   None.   Subjective:    Creed Laban chest improved.  Objective:    Vitals:   06/25/23 2037 06/25/23 2310 06/26/23 0400 06/26/23 0806  BP:  97/63 129/77 (!) 116/50  Pulse: 92  93 83  Resp:   13 11  Temp: 98.2 F (36.8 C) 98.2 F (36.8 C) 98.1 F (36.7 C) 98 F (36.7 C)  TempSrc: Oral Oral Oral Oral  SpO2: 97%  97% 97%  Weight:      Height:       SpO2: 97 % O2 Flow Rate (L/min): 1 L/min   Intake/Output Summary (Last 24 hours) at 06/26/2023 0920 Last data filed at 06/26/2023 0746 Gross per 24 hour  Intake 480 ml  Output 175 ml  Net 305 ml   Filed Weights   06/25/23 0157  Weight: 98 kg    Exam: General exam: In no acute distress. Respiratory system: Good air movement and clear to auscultation. Cardiovascular system: S1 & S2 heard, RRR. No JVD. Gastrointestinal system: Abdomen is nondistended, soft and nontender.  Extremities: No pedal edema. Skin: No rashes, lesions or ulcers Psychiatry: Judgement and insight appear normal. Mood & affect appropriate.  Data Reviewed:    Labs: Basic Metabolic Panel: Recent Labs  Lab 06/25/23 0203 06/25/23 0208 06/25/23 0448 06/26/23 0416  NA 140 139  140 140 137  K 4.5 4.4  4.4 4.8 4.7  CL 97* 99 98 98  CO2 24  --  25 25  GLUCOSE 156* 155* 175* 96  BUN 44* 44* 45* 48*  CREATININE 14.45* 15.40* 14.50* 16.34*  CALCIUM  9.3  --  8.7* 8.8*  MG  --   --  2.0  --   PHOS  --   --  7.5*  --    GFR Estimated Creatinine Clearance: 6.5 mL/min (A) (by C-G formula based on SCr of 16.34 mg/dL (H)). Liver Function Tests: Recent Labs  Lab 06/25/23 0203  AST 25  ALT 15  ALKPHOS 79  BILITOT 0.7  PROT 8.3*  ALBUMIN  3.6   No results for input(s): LIPASE, AMYLASE in the last 168 hours. No results for input(s): AMMONIA in the last 168 hours. Coagulation profile No results for input(s):  INR, PROTIME in the last 168 hours. COVID-19  Labs  No results for input(s): DDIMER, FERRITIN, LDH, CRP in the last 72 hours.  Lab Results  Component Value Date   SARSCOV2NAA NEGATIVE 04/02/2023   SARSCOV2NAA NEGATIVE 01/03/2023   SARSCOV2NAA NEGATIVE 09/12/2022   SARSCOV2NAA NEGATIVE 04/17/2022    CBC: Recent Labs  Lab 06/25/23 0203 06/25/23 0208 06/26/23 0416  WBC 9.6  --  8.0  NEUTROABS 6.6  --   --   HGB 12.8* 13.3  12.9* 10.4*  HCT 38.7* 39.0  38.0* 31.1*  MCV 97.2  --  95.7  PLT 177  --  137*   Cardiac Enzymes: No results for input(s): CKTOTAL, CKMB, CKMBINDEX, TROPONINI in the last 168 hours. BNP (last 3 results) No results for input(s): PROBNP in the last 8760 hours. CBG: Recent Labs  Lab 06/25/23 0531 06/25/23 1700 06/25/23 2344 06/26/23 0616  GLUCAP 152* 119* 142* 84   D-Dimer: No results for input(s): DDIMER in the last 72 hours. Hgb A1c: No results for input(s): HGBA1C in the last 72 hours. Lipid Profile: No results for input(s): CHOL, HDL, LDLCALC, TRIG, CHOLHDL, LDLDIRECT in the last 72 hours. Thyroid function studies: No results for input(s): TSH, T4TOTAL, T3FREE, THYROIDAB in the last 72 hours.  Invalid input(s): FREET3 Anemia work up: No results for input(s): VITAMINB12, FOLATE, FERRITIN, TIBC, IRON , RETICCTPCT in the last 72 hours. Sepsis Labs: Recent Labs  Lab 06/25/23 0203 06/25/23 0448 06/26/23 0416  PROCALCITON  --  0.40  --   WBC 9.6  --  8.0   Microbiology No results found for this or any previous visit (from the past 240 hours).   Medications:    aspirin   325 mg Oral Daily   atorvastatin   80 mg Oral Daily   calcium  acetate  2,001 mg Oral TID with meals   carvedilol   12.5 mg Oral BID WC   [START ON 06/28/2023] epinephrine   0-10 mcg/min Intravenous To OR   famotidine   20 mg Oral Daily   [START ON 06/28/2023] heparin  sodium (porcine) 5,000 Units, papaverine  60 mg in electrolyte-A (PLASMALYTE-A PH 7.4) 1,000 mL  irrigation   Irrigation To OR   insulin  aspart  0-6 Units Subcutaneous Q6H   [START ON 06/28/2023] insulin    Intravenous To OR   irbesartan   150 mg Oral Daily   [START ON 06/28/2023] [place] Blood Cardioplegia vial (lidocaine /magnesium /mannitol  0.26g-4g-6.4g)   Intracoronary To OR   pantoprazole  (PROTONIX ) IV  40 mg Intravenous Daily   [START ON 06/28/2023] phenylephrine   30-200 mcg/min Intravenous To OR   [START ON 06/28/2023] potassium chloride   80 mEq Other To OR   [START ON 06/28/2023] tranexamic acid   15 mg/kg Intravenous To OR   [START ON 06/28/2023] tranexamic acid   2 mg/kg Intracatheter To OR   Continuous Infusions:  sodium chloride      [START ON 06/28/2023]  ceFAZolin  (ANCEF ) IV     [START ON 06/28/2023]  ceFAZolin  (ANCEF ) IV     [START ON 06/28/2023] dexmedetomidine      [START ON 06/28/2023] heparin  30,000 units/NS 1000 mL solution for CELLSAVER     heparin  1,400 Units/hr (06/26/23 0601)   [START ON 06/28/2023] milrinone      nitroGLYCERIN  10 mcg/min (06/26/23 0123)   [START ON 06/28/2023] nitroGLYCERIN      [START ON 06/28/2023] norepinephrine      [START ON 06/28/2023] tranexamic acid  (CYKLOKAPRON ) 2,500 mg in sodium chloride  0.9 % 250 mL (10 mg/mL) infusion     [START ON 06/28/2023] vancomycin   LOS: 1 day   Erle Odell Castor  [facility]  06/26/2023, 9:20 AM

## 2023-06-26 NOTE — Progress Notes (Signed)
 PHARMACY - ANTICOAGULATION CONSULT NOTE  Pharmacy Consult for Heparin  Indication: chest pain/ACS; multivessel disease s/p cath  Allergies  Allergen Reactions   Imdur  [Isosorbide  Nitrate] Other (See Comments)    Endorsed headache in the past - willing to retry    Patient Measurements: Height: 6' 1 (185.4 cm) Weight: 98 kg (216 lb) IBW/kg (Calculated) : 79.9 Heparin  Dosing Weight: HEPARIN  DW (KG): 98 kg   Vital Signs: Temp: 98 F (36.7 C) (12/31 1223) Temp Source: Oral (12/31 1223) BP: 119/55 (12/31 1223) Pulse Rate: 83 (12/31 1223)  Labs: Recent Labs    06/25/23 0203 06/25/23 0208 06/25/23 0448 06/25/23 0813 06/26/23 0416 06/26/23 1504  HGB 12.8* 13.3  12.9*  --   --  10.4*  --   HCT 38.7* 39.0  38.0*  --   --  31.1*  --   PLT 177  --   --   --  137*  --   HEPARINUNFRC  --   --   --   --  0.25* 0.38  CREATININE 14.45* 15.40* 14.50*  --  16.34*  --   TROPONINIHS 860*  --  4,749* 10,982*  --   --     Estimated Creatinine Clearance: 6.5 mL/min (A) (by C-G formula based on SCr of 16.34 mg/dL (H)).   Medical History: Past Medical History:  Diagnosis Date   Allergy, unspecified, initial encounter 08/25/2019   Asthma    as a child   Cellulitis, perineum 11/26/2019   CHF (congestive heart failure) (HCC) 2021   Complication of vascular dialysis catheter 08/25/2019   COVID-19 virus infection 07/27/2019   Diabetes mellitus without complication (HCC)    type 2   Dilated cardiomyopathy (HCC) 07/03/2018   ESRD on hemodialysis (HCC) 06/17/2018   MWF at [facility]   Fe deficiency anemia 12/23/2018   Hypertension    Legally blind    B/L   Pneumonia 2021   Type 2 diabetes mellitus with diabetic peripheral angiopathy without gangrene (HCC) 08/25/2019   Unspecified protein-calorie malnutrition (HCC) 08/25/2019    Medications:  Medications Prior to Admission  Medication Sig Dispense Refill Last Dose/Taking   alum & mag hydroxide-simeth (MAALOX/MYLANTA) 200-200-20 MG/5ML  suspension Take 30 mLs by mouth every 6 (six) hours as needed for indigestion or heartburn.   06/24/2023 Bedtime   amLODipine  (NORVASC ) 5 MG tablet Take 1 tablet (5 mg total) by mouth daily. 30 tablet 0 Past Month   calcium  acetate (PHOSLO ) 667 MG capsule Take 2 capsules (1,334 mg total) by mouth with breakfast, with lunch, and with evening meal. (Patient taking differently: Take 2,001 mg by mouth with breakfast, with lunch, and with evening meal.) 180 capsule 0 06/24/2023   carvedilol  (COREG ) 25 MG tablet Take 1 tablet (25 mg total) by mouth 2 (two) times daily with a meal. 60 tablet 0 06/24/2023   famotidine  (PEPCID ) 20 MG tablet Take 20 mg by mouth daily.   06/24/2023   ferric citrate  (AURYXIA ) 1 GM 210 MG(Fe) tablet Take 2 tablets (420 mg total) by mouth daily. (Patient taking differently: Take 420 mg by mouth 3 (three) times daily with meals.) 60 tablet 0 Past Week   gabapentin  (NEURONTIN ) 100 MG capsule Take 1 capsule (100 mg total) by mouth 2 (two) times daily. (Patient taking differently: Take 100 mg by mouth 3 (three) times daily as needed (hiccups).)   Taking Differently   multivitamin (RENA-VIT) TABS tablet Take 1 tablet by mouth daily.   06/24/2023   olmesartan  (BENICAR ) 20 MG tablet Take  1 tablet (20 mg total) by mouth daily. 30 tablet 0 06/24/2023   pantoprazole  (PROTONIX ) 20 MG tablet Take 20 mg by mouth daily.   06/24/2023   atorvastatin  (LIPITOR ) 80 MG tablet Take 1 tablet (80 mg total) by mouth daily. (Patient not taking: Reported on 06/25/2023) 30 tablet 0 Not Taking   nitroGLYCERIN  (NITROSTAT ) 0.4 MG SL tablet Place 0.4 mg under the tongue every 5 (five) minutes as needed for chest pain. (Patient not taking: Reported on 05/09/2023)   Not Taking    Assessment: 52 yo M admitted overnight with CP and started on heparin  infusion.  Pt is now s/p cardiac cath with multivessel disease and pending TCTS consult.  Pharmacy consulted to resume heparin  2 hrs post- TR band removal (removed at  1330).  Heparin  level this afternoon is within goal range at 0.38.  No bleeding or complications noted.  Goal of Therapy:  Heparin  level 0.3-0.7 units/ml Monitor platelets by anticoagulation protocol: Yes   Plan:  Continue IV heparin  at 1400 units/hr. Daily heparin  level and CBC.  Harlene Barlow, [employer], BCCP Clinical Pharmacist  06/26/2023 4:04 PM   [facility] pharmacy phone numbers are listed on [email or web address]

## 2023-06-26 NOTE — Consult Note (Signed)
 [facility] Consultation Note    Indication for Consultation:  Management of ESRD/hemodialysis, anemia, hypertension/volume, and secondary hyperparathyroidism.  HPI: Corey Park is a 52 y.o. male with PMH including ESRD on dialysis, CHF, HTN, T2DM who presented to the ED with chest pain. Patient reported associated dizziness and diaphoresis. His troponins were elevated and EKG showed more prominent ST depressions. Cardiology was consulted and he was started on a heparin  drip. He had a left heart cath which showed triple-vessel disease. CT surgery was consulted and patient reports he thinks he is having a bypass next week. He reports he is feeling ok today. Has chest pain off and on but none at present. Denies SOB, orthopnea, HA, dizziness, abdominal pain, N/V/D, edema, fever, chills. He reports his last dialysis was Friday, was supposed to go Sunday due to the outpatient HD holiday schedule but did not attend. Labs today notable for K+ 4.7 BUN 48 Cr 16.34 Ca 8.8 Hgb 10.4.    Past Medical History:  Diagnosis Date   Allergy, unspecified, initial encounter 08/25/2019   Asthma    as a child   Cellulitis, perineum 11/26/2019   CHF (congestive heart failure) (HCC) 2021   Complication of vascular dialysis catheter 08/25/2019   COVID-19 virus infection 07/27/2019   Diabetes mellitus without complication (HCC)    type 2   Dilated cardiomyopathy (HCC) 07/03/2018   ESRD on hemodialysis (HCC) 06/17/2018   MWF at [facility]   Fe deficiency anemia 12/23/2018   Hypertension    Legally blind    B/L   Pneumonia 2021   Type 2 diabetes mellitus with diabetic peripheral angiopathy without gangrene (HCC) 08/25/2019   Unspecified protein-calorie malnutrition (HCC) 08/25/2019   Past Surgical History:  Procedure Laterality Date   BASCILIC VEIN TRANSPOSITION Right 10/16/2019   Procedure: Basilic Vein Transposition Right Arm;  Surgeon: Serene Gaile ORN, MD;  Location: [facility] OR;  Service:  Vascular;  Laterality: Right;   BASCILIC VEIN TRANSPOSITION Right 01/29/2020   Procedure: RIGHT ARM SECOND STAGE BASCILIC VEIN TRANSPOSITION;  Surgeon: Serene Gaile ORN, MD;  Location: [facility] OR;  Service: Vascular;  Laterality: Right;   BIOPSY  12/22/2018   Procedure: BIOPSY;  Surgeon: Celestia Agent, MD;  Location: [facility] ENDOSCOPY;  Service: Endoscopy;;   CORONARY ULTRASOUND/IVUS N/A 12/13/2022   Procedure: Coronary Ultrasound/IVUS;  Surgeon: Dann Candyce RAMAN, MD;  Location: [facility] INVASIVE CV LAB;  Service: Cardiovascular;  Laterality: N/A;   ESOPHAGOGASTRODUODENOSCOPY N/A 12/22/2018   Procedure: ESOPHAGOGASTRODUODENOSCOPY (EGD);  Surgeon: Celestia Agent, MD;  Location: [facility] ENDOSCOPY;  Service: Endoscopy;  Laterality: N/A;   IR FLUORO GUIDE CV LINE RIGHT  07/29/2019   IR US  GUIDE VASC ACCESS RIGHT  07/29/2019   LEFT HEART CATH AND CORONARY ANGIOGRAPHY N/A 12/13/2022   Procedure: LEFT HEART CATH AND CORONARY ANGIOGRAPHY;  Surgeon: Dann Candyce RAMAN, MD;  Location: [facility] INVASIVE CV LAB;  Service: Cardiovascular;  Laterality: N/A;   LEFT HEART CATH AND CORONARY ANGIOGRAPHY N/A 06/25/2023   Procedure: LEFT HEART CATH AND CORONARY ANGIOGRAPHY;  Surgeon: Verlin Lonni BIRCH, MD;  Location: [facility] INVASIVE CV LAB;  Service: Cardiovascular;  Laterality: N/A;   TRANSESOPHAGEAL ECHOCARDIOGRAM (CATH LAB) N/A 05/10/2023   Procedure: TRANSESOPHAGEAL ECHOCARDIOGRAM;  Surgeon: Barbaraann Darryle Ned, MD;  Location: [facility] INVASIVE CV LAB;  Service: Cardiovascular;  Laterality: N/A;   Family History  Problem Relation Age of Onset   CAD Mother    Hypertension Mother    Diabetes Neg Hx    Stroke Neg Hx    Cancer  Neg Hx    Kidney failure Neg Hx    Stomach cancer Neg Hx    Colon cancer Neg Hx    Rectal cancer Neg Hx    Social History:  reports that he has never smoked. He has never used smokeless tobacco. He reports that he does not currently use alcohol. He reports that he does not currently use drugs after having used the  following drugs: Marijuana.  ROS: As per HPI otherwise negative.  Physical Exam: Vitals:   06/25/23 2037 06/25/23 2310 06/26/23 0400 06/26/23 0806  BP:  97/63 129/77 (!) 116/50  Pulse: 92  93 83  Resp:   13 11  Temp: 98.2 F (36.8 C) 98.2 F (36.8 C) 98.1 F (36.7 C) 98 F (36.7 C)  TempSrc: Oral Oral Oral Oral  SpO2: 97%  97% 97%  Weight:      Height:         General: Well developed, well nourished, in no acute distress. Head: Normocephalic, atraumatic, sclera non-icteric, mucus membranes are moist. Lungs: Clear bilaterally to auscultation without wheezes, rales, or rhonchi. Breathing is unlabored. Heart: RRR with normal S1, S2. No murmurs, rubs, or gallops appreciated. Abdomen: Soft, non-tender, non-distended with normoactive bowel sounds.  Musculoskeletal:  Strength and tone appear normal for age. Lower extremities: No edema b/l lower extremities Neuro: Alert and oriented X 3. Moves all extremities spontaneously. Psych:  Responds to questions appropriately with a normal affect. Dialysis Access: AVF + t/b  Allergies  Allergen Reactions   Imdur  [Isosorbide  Nitrate] Other (See Comments)    Endorsed headache in the past - willing to retry   Prior to Admission medications   Medication Sig Start Date End Date Taking? Authorizing Provider  alum & mag hydroxide-simeth (MAALOX/MYLANTA) 200-200-20 MG/5ML suspension Take 30 mLs by mouth every 6 (six) hours as needed for indigestion or heartburn.   Yes [provider]  amLODipine  (NORVASC ) 5 MG tablet Take 1 tablet (5 mg total) by mouth daily. 05/12/23 10/09/23 Yes Cindy Garnette POUR, MD  calcium  acetate (PHOSLO ) 667 MG capsule Take 2 capsules (1,334 mg total) by mouth with breakfast, with lunch, and with evening meal. Patient taking differently: Take 2,001 mg by mouth with breakfast, with lunch, and with evening meal. 12/14/22  Yes Tobie Gaines, DO  carvedilol  (COREG ) 25 MG tablet Take 1 tablet (25 mg total) by mouth 2 (two)  times daily with a meal. 12/14/22  Yes Tobie Gaines, DO  famotidine  (PEPCID ) 20 MG tablet Take 20 mg by mouth daily. 04/20/23  Yes [provider]  ferric citrate  (AURYXIA ) 1 GM 210 MG(Fe) tablet Take 2 tablets (420 mg total) by mouth daily. Patient taking differently: Take 420 mg by mouth 3 (three) times daily with meals. 12/14/22  Yes Tobie Gaines, DO  gabapentin  (NEURONTIN ) 100 MG capsule Take 1 capsule (100 mg total) by mouth 2 (two) times daily. Patient taking differently: Take 100 mg by mouth 3 (three) times daily as needed (hiccups). 04/03/23  Yes Ghimire, Kuber, MD  multivitamin (RENA-VIT) TABS tablet Take 1 tablet by mouth daily. 11/22/22  Yes [provider]  olmesartan  (BENICAR ) 20 MG tablet Take 1 tablet (20 mg total) by mouth daily. 12/14/22  Yes Tobie Gaines, DO  pantoprazole  (PROTONIX ) 20 MG tablet Take 20 mg by mouth daily. 04/19/23  Yes [provider]  atorvastatin  (LIPITOR ) 80 MG tablet Take 1 tablet (80 mg total) by mouth daily. Patient not taking: Reported on 06/25/2023 12/15/22   Tobie Gaines, DO  nitroGLYCERIN  (  NITROSTAT ) 0.4 MG SL tablet Place 0.4 mg under the tongue every 5 (five) minutes as needed for chest pain. Patient not taking: Reported on 05/09/2023 12/19/22   [provider]   Current Facility-Administered Medications  Medication Dose Route Frequency Provider Last Rate Last Admin   0.9 %  sodium chloride  infusion  250 mL Intravenous PRN Verlin Lonni BIRCH, MD       acetaminophen  (TYLENOL ) tablet 650 mg  650 mg Oral Q6H PRN Verlin Lonni BIRCH, MD   650 mg at 06/26/23 9275   Or   acetaminophen  (TYLENOL ) suppository 650 mg  650 mg Rectal Q6H PRN Verlin Lonni BIRCH, MD       alum & mag hydroxide-simeth (MAALOX/MYLANTA) 200-200-20 MG/5ML suspension 30 mL  30 mL Oral Q4H PRN Odell Celinda Balo, MD       aspirin  tablet 325 mg  325 mg Oral Daily Verlin Lonni BIRCH, MD   325 mg at 06/26/23 9071   atorvastatin  (LIPITOR )  tablet 80 mg  80 mg Oral Daily Verlin Lonni BIRCH, MD   80 mg at 06/26/23 9071   calcium  acetate (PHOSLO ) capsule 2,001 mg  2,001 mg Oral TID with meals Verlin Lonni BIRCH, MD   2,001 mg at 06/26/23 9256   carvedilol  (COREG ) tablet 12.5 mg  12.5 mg Oral BID WC Verlin Lonni BIRCH, MD   12.5 mg at 06/26/23 0745   [START ON 06/28/2023] ceFAZolin  (ANCEF ) IVPB 2g/100 mL premix  2 g Intravenous To OR Lightfoot, Linnie KIDD, MD       [START ON 06/28/2023] ceFAZolin  (ANCEF ) IVPB 2g/100 mL premix  2 g Intravenous To OR Lightfoot, Linnie KIDD, MD       [START ON 06/28/2023] dexmedetomidine  (PRECEDEX ) 400 MCG/100ML (4 mcg/mL) infusion  0.1-0.7 mcg/kg/hr Intravenous To OR Lightfoot, Linnie KIDD, MD       [START ON 06/28/2023] EPINEPHrine  (ADRENALIN ) 5 mg in NS 250 mL (0.02 mg/mL) premix infusion  0-10 mcg/min Intravenous To OR Lightfoot, Linnie KIDD, MD       famotidine  (PEPCID ) tablet 20 mg  20 mg Oral Daily Verlin Lonni BIRCH, MD   20 mg at 06/26/23 0928   [START ON 06/28/2023] heparin  30,000 units/NS 1000 mL solution for CELLSAVER   Other To OR Lightfoot, Linnie KIDD, MD       heparin  ADULT infusion 100 units/mL (25000 units/250mL)  1,400 Units/hr Intravenous Continuous Odell Celinda Balo, MD 14 mL/hr at 06/26/23 0601 1,400 Units/hr at 06/26/23 0601   [START ON 06/28/2023] heparin  sodium (porcine) 5,000 Units, papaverine  60 mg in electrolyte-A (PLASMALYTE-A PH 7.4) 1,000 mL irrigation   Irrigation To OR Lightfoot, Harrell O, MD       insulin  aspart (novoLOG ) injection 0-6 Units  0-6 Units Subcutaneous Q6H Verlin Lonni BIRCH, MD   1 Units at 06/25/23 0533   [START ON 06/28/2023] insulin  regular, human (MYXREDLIN ) 100 units/ 100 mL infusion   Intravenous To OR Lightfoot, Linnie KIDD, MD       irbesartan  (AVAPRO ) tablet 150 mg  150 mg Oral Daily Verlin Lonni BIRCH, MD   150 mg at 06/26/23 9076   [START ON 06/28/2023] [place] Blood Cardioplegia vial (lidocaine /magnesium /mannitol  0.26g-4g-6.4g)    Intracoronary To OR Shyrl Linnie KIDD, MD       [START ON 06/28/2023] milrinone  (PRIMACOR ) 20 MG/100 ML (0.2 mg/mL) infusion  0.3 mcg/kg/min Intravenous To OR Lightfoot, Harrell O, MD       nitroGLYCERIN  50 mg in dextrose  5 % 250 mL (0.2 mg/mL) infusion  0-200 mcg/min Intravenous  Continuous Verlin Lonni BIRCH, MD 3 mL/hr at 06/26/23 0123 10 mcg/min at 06/26/23 0123   [START ON 06/28/2023] nitroGLYCERIN  50 mg in dextrose  5 % 250 mL (0.2 mg/mL) infusion  2-200 mcg/min Intravenous To OR Lightfoot, Linnie KIDD, MD       [START ON 06/28/2023] norepinephrine  (LEVOPHED ) 4mg  in (0.016 mg/mL) premix infusion  0-40 mcg/min Intravenous To OR Lightfoot, Linnie KIDD, MD       ondansetron  (ZOFRAN ) injection 4 mg  4 mg Intravenous Q6H PRN Verlin Lonni BIRCH, MD   4 mg at 06/26/23 0725   pantoprazole  (PROTONIX ) injection 40 mg  40 mg Intravenous Daily Verlin Lonni BIRCH, MD   40 mg at 06/26/23 0928   [START ON 06/28/2023] phenylephrine  (NEO-SYNEPHRINE) 20mg /NS premix infusion  30-200 mcg/min Intravenous To OR Lightfoot, Linnie KIDD, MD       [START ON 06/28/2023] potassium chloride  injection 80 mEq  80 mEq Other To OR Lightfoot, Linnie KIDD, MD       sodium chloride  flush (NS) 0.9 % injection 3 mL  3 mL Intravenous PRN Verlin Lonni BIRCH, MD       [START ON 06/28/2023] tranexamic acid  (CYKLOKAPRON ) 2,500 mg in sodium chloride  0.9 % 250 mL (10 mg/mL) infusion  1.5 mg/kg/hr Intravenous To OR Shyrl Linnie KIDD, MD       [START ON 06/28/2023] tranexamic acid  (CYKLOKAPRON ) bolus via infusion - over 30 minutes 1,470 mg  15 mg/kg Intravenous To OR Lightfoot, Linnie KIDD, MD       [START ON 06/28/2023] tranexamic acid  (CYKLOKAPRON ) pump prime solution 196 mg  2 mg/kg Intracatheter To OR Lightfoot, Linnie KIDD, MD       [START ON 06/28/2023] Vancomycin  (VANCOCIN ) 1,500 mg in sodium chloride  0.9 % 500 mL IVPB  1,500 mg Intravenous To OR Shyrl Linnie KIDD, MD       Labs: Basic Metabolic Panel: Recent Labs  Lab  06/25/23 0203 06/25/23 0208 06/25/23 0448 06/26/23 0416  NA 140 139  140 140 137  K 4.5 4.4  4.4 4.8 4.7  CL 97* 99 98 98  CO2 24  --  25 25  GLUCOSE 156* 155* 175* 96  BUN 44* 44* 45* 48*  CREATININE 14.45* 15.40* 14.50* 16.34*  CALCIUM  9.3  --  8.7* 8.8*  PHOS  --   --  7.5*  --    Liver Function Tests: Recent Labs  Lab 06/25/23 0203  AST 25  ALT 15  ALKPHOS 79  BILITOT 0.7  PROT 8.3*  ALBUMIN  3.6   No results for input(s): LIPASE, AMYLASE in the last 168 hours. No results for input(s): AMMONIA in the last 168 hours. CBC: Recent Labs  Lab 06/25/23 0203 06/25/23 0208 06/26/23 0416  WBC 9.6  --  8.0  NEUTROABS 6.6  --   --   HGB 12.8* 13.3  12.9* 10.4*  HCT 38.7* 39.0  38.0* 31.1*  MCV 97.2  --  95.7  PLT 177  --  137*   Cardiac Enzymes: No results for input(s): CKTOTAL, CKMB, CKMBINDEX, TROPONINI in the last 168 hours. CBG: Recent Labs  Lab 06/25/23 0531 06/25/23 1700 06/25/23 2344 06/26/23 0616  GLUCAP 152* 119* 142* 84   Iron  Studies: No results for input(s): IRON , TIBC, TRANSFERRIN, FERRITIN in the last 72 hours. Studies/Results: CARDIAC CATHETERIZATION Result Date: 06/25/2023   Prox RCA lesion is 80% stenosed.   Ost Cx to Mid Cx lesion is 99% stenosed.   Prox LAD lesion is 95% stenosed.   Mid  LAD lesion is 50% stenosed.   Mid LM to Ost LAD lesion is 50% stenosed. Severe three vessel CAD Moderate distal left main stenosis Severe, heavily calcified proximal LAD stenosis Severe stenosis (likely ulcerated plaque) in the ostial/proximal Circumflex artery Severe stenosis in the proximal RCA (Moderate caliber co-dominant vessel) Elevated LV filling pressures Recommendations: I would recommend surgical revascularization with CABG in this diabetic male with multi-vessel CAD, ESRD on HD. Will consult CT surgery. Resume IV heparin  2 hours post sheath removal.   ECHOCARDIOGRAM COMPLETE Result Date: 06/25/2023    ECHOCARDIOGRAM REPORT    Patient Name:   FENIX RORKE Date of Exam: 06/25/2023 Medical Rec #:  [id number]       Height:       73.0 in Accession #:    [id number]      Weight:       216.0 lb Date of Birth:  [birth year]       BSA:          2.223 m Patient Age:    52 years        BP:           109/99 mmHg Patient Gender: M               HR:           82 bpm. Exam Location:  Inpatient Procedure: 2D Echo, 3D Echo, Cardiac Doppler, Color Doppler and Strain Analysis Indications:    Stroke  History:        Patient has prior history of Echocardiogram examinations, most                 recent 04/03/2023. Risk Factors:Diabetes, Hypertension and                 Dyslipidemia.  Sonographer:    Ozell Free Referring Phys: [id number] JUSTIN B HOWERTER  Sonographer Comments: Global longitudinal strain was attempted. IMPRESSIONS  1. Left ventricular ejection fraction, by estimation, is 40 to 45%. The left ventricle has mildly decreased function. The left ventricle demonstrates regional wall motion abnormalities (see scoring diagram/findings for description). Left ventricular diastolic parameters are consistent with Grade II diastolic dysfunction (pseudonormalization). GLS -8.6%.  2. Right ventricular systolic function is normal. The right ventricular size is normal.  3. The mitral valve is normal in structure. No evidence of mitral valve regurgitation. No evidence of mitral stenosis.  4. The aortic valve is normal in structure. Aortic valve regurgitation is not visualized. No aortic stenosis is present.  5. The inferior vena cava is normal in size with greater than 50% respiratory variability, suggesting right atrial pressure of 3 mmHg. FINDINGS  Left Ventricle: Left ventricular ejection fraction, by estimation, is 40 to 45%. The left ventricle has mildly decreased function. The left ventricle demonstrates regional wall motion abnormalities. The left ventricular internal cavity size was normal in size. There is no left ventricular hypertrophy. Left ventricular  diastolic parameters are consistent with Grade II diastolic dysfunction (pseudonormalization).  LV Wall Scoring: The inferior wall and posterior wall are hypokinetic. Right Ventricle: The right ventricular size is normal. No increase in right ventricular wall thickness. Right ventricular systolic function is normal. Left Atrium: Left atrial size was normal in size. Right Atrium: Right atrial size was normal in size. Pericardium: There is no evidence of pericardial effusion. Mitral Valve: The mitral valve is normal in structure. No evidence of mitral valve regurgitation. No evidence of mitral valve stenosis. Tricuspid Valve: The tricuspid valve is normal in structure. Tricuspid  valve regurgitation is trivial. No evidence of tricuspid stenosis. Aortic Valve: The aortic valve is normal in structure. Aortic valve regurgitation is not visualized. No aortic stenosis is present. Aortic valve mean gradient measures 5.0 mmHg. Aortic valve peak gradient measures 8.4 mmHg. Aortic valve area, by VTI measures 2.12 cm. Pulmonic Valve: The pulmonic valve was normal in structure. Pulmonic valve regurgitation is trivial. No evidence of pulmonic stenosis. Aorta: The aortic root is normal in size and structure. Venous: The inferior vena cava is normal in size with greater than 50% respiratory variability, suggesting right atrial pressure of 3 mmHg. IAS/Shunts: No atrial level shunt detected by color flow Doppler.  LEFT VENTRICLE PLAX 2D LVIDd:         5.60 cm      Diastology LVIDs:         4.60 cm      LV e' medial:    5.22 cm/s LV PW:         1.10 cm      LV E/e' medial:  17.9 LV IVS:        1.10 cm      LV e' lateral:   9.46 cm/s LVOT diam:     2.10 cm      LV E/e' lateral: 9.9 LV SV:         59 LV SV Index:   27 LVOT Area:     3.46 cm                              3D Volume EF: LV Volumes (MOD)            3D EF:        37 % LV vol d, MOD A2C: 147.0 ml LV EDV:       239 ml LV vol d, MOD A4C: 148.0 ml LV ESV:       151 ml LV vol s,  MOD A2C: 84.2 ml  LV SV:        88 ml LV vol s, MOD A4C: 91.0 ml LV SV MOD A2C:     62.8 ml LV SV MOD A4C:     148.0 ml LV SV MOD BP:      61.3 ml RIGHT VENTRICLE             IVC RV Basal diam:  4.00 cm     IVC diam: 2.00 cm RV S prime:     15.20 cm/s TAPSE (M-mode): 2.3 cm LEFT ATRIUM             Index        RIGHT ATRIUM           Index LA diam:        3.90 cm 1.75 cm/m   RA Area:     12.80 cm LA Vol (A2C):   85.6 ml 38.50 ml/m  RA Volume:   32.70 ml  14.71 ml/m LA Vol (A4C):   46.1 ml 20.74 ml/m LA Biplane Vol: 64.4 ml 28.97 ml/m  AORTIC VALVE AV Area (Vmax):    2.25 cm AV Area (Vmean):   2.10 cm AV Area (VTI):     2.12 cm AV Vmax:           145.00 cm/s AV Vmean:          101.000 cm/s AV VTI:            0.279 m AV Peak  Grad:      8.4 mmHg AV Mean Grad:      5.0 mmHg LVOT Vmax:         94.30 cm/s LVOT Vmean:        61.300 cm/s LVOT VTI:          0.171 m LVOT/AV VTI ratio: 0.61  AORTA Ao Root diam: 3.60 cm Ao Asc diam:  3.20 cm MITRAL VALVE MV Area (PHT): 4.57 cm    SHUNTS MV Decel Time: 166 msec    Systemic VTI:  0.17 m MV E velocity: 93.30 cm/s  Systemic Diam: 2.10 cm MV A velocity: 72.80 cm/s MV E/A ratio:  1.28 Aditya Sabharwal Electronically signed by Ria Commander Signature Date/Time: 06/25/2023/9:17:22 AM    Final    DG Chest Port 1 View Result Date: 06/25/2023 CLINICAL DATA:  cp EXAM: PORTABLE CHEST 1 VIEW COMPARISON:  CT angio chest 06/01/2023, chest x-ray 06/01/2023 FINDINGS: The heart and mediastinal contours are unchanged. Atherosclerotic plaque Interval development of patchy bilateral mid to lower lung zone airspace opacities. No pulmonary edema. No pleural effusion. No pneumothorax. No acute osseous abnormality. IMPRESSION: 1. Interval development of patchy bilateral mid to lower lung zone airspace opacities. Followup PA and lateral chest X-ray is recommended in 3-4 weeks following therapy to ensure resolution. 2.  Aortic Atherosclerosis (ICD10-I70.0). Electronically Signed   By:  Morgane  Naveau M.D.   On: 06/25/2023 02:18    Dialysis Orders:  Outpatient dialysis orders: [facility] MWF 180NRe 4 hours BFR 450 DFR Auto 1.5 EDW 102.3kg 2K 2Ca AVF 15g No heparin  Mircera 50mcg IV q 2 weeks- last dose 06/19/23 Calcitriol  1.5 mcg PO q HD  Assessment/Plan:  NSTEMI: 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 On IV heparin . Management per admitting team/cardiology  ESRD:  Typically on MWF schedule, off schedule this week due to holiday/missed HD on Sunday. Will plan for HD today and resume MWF schedule later this week.   Hypertension/volume: BP controlled, appears euvolemic on exam. Below prior EDW if weights are correct.  Continue home BP meds.   Anemia: Hgb 10.4. ESA recently dosed, continue to monitor  Metabolic bone disease: Calcium  controlled, phos elevated. Continue VDRA and binders  Nutrition:  Alb 3.6, will need renal diet/protein supplements once tolerating PO  Peaches Vanoverbeke, PA-C 06/26/2023, 10:32 AM  [facility] Pager: [phone]

## 2023-06-26 NOTE — Plan of Care (Signed)
  Problem: Metabolic: Goal: Ability to maintain appropriate glucose levels will improve Outcome: Progressing   Problem: Nutritional: Goal: Maintenance of adequate nutrition will improve Outcome: Progressing   Problem: Skin Integrity: Goal: Risk for impaired skin integrity will decrease Outcome: Progressing   Problem: Education: Goal: Understanding of CV disease, CV risk reduction, and recovery process will improve Outcome: Progressing   Problem: Cardiovascular: Goal: Vascular access site(s) Level 0-1 will be maintained Outcome: Progressing   Problem: Coping: Goal: Ability to adjust to condition or change in health will improve Outcome: Not Progressing   Problem: Fluid Volume: Goal: Ability to maintain a balanced intake and output will improve Outcome: Not Progressing   Problem: Nutritional: Goal: Progress toward achieving an optimal weight will improve Outcome: Not Progressing   Problem: Activity: Goal: Ability to return to baseline activity level will improve Outcome: Not Progressing   Problem: Coping: Goal: Level of anxiety will decrease Outcome: Not Progressing

## 2023-06-27 ENCOUNTER — Inpatient Hospital Stay (HOSPITAL_COMMUNITY): Payer: Medicare Other

## 2023-06-27 DIAGNOSIS — Z01810 Encounter for preprocedural cardiovascular examination: Secondary | ICD-10-CM

## 2023-06-27 DIAGNOSIS — I214 Non-ST elevation (NSTEMI) myocardial infarction: Secondary | ICD-10-CM | POA: Diagnosis not present

## 2023-06-27 LAB — CBC
HCT: 30.1 % — ABNORMAL LOW (ref 39.0–52.0)
Hemoglobin: 10.2 g/dL — ABNORMAL LOW (ref 13.0–17.0)
MCH: 32.1 pg (ref 26.0–34.0)
MCHC: 33.9 g/dL (ref 30.0–36.0)
MCV: 94.7 fL (ref 80.0–100.0)
Platelets: 140 10*3/uL — ABNORMAL LOW (ref 150–400)
RBC: 3.18 MIL/uL — ABNORMAL LOW (ref 4.22–5.81)
RDW: 16.3 % — ABNORMAL HIGH (ref 11.5–15.5)
WBC: 8.3 10*3/uL (ref 4.0–10.5)
nRBC: 0.2 % (ref 0.0–0.2)

## 2023-06-27 LAB — GLUCOSE, CAPILLARY
Glucose-Capillary: 89 mg/dL (ref 70–99)
Glucose-Capillary: 159 mg/dL — ABNORMAL HIGH (ref 70–99)
Glucose-Capillary: 67 mg/dL — ABNORMAL LOW (ref 70–99)

## 2023-06-27 LAB — RENAL FUNCTION PANEL
Albumin: 3 g/dL — ABNORMAL LOW (ref 3.5–5.0)
Anion gap: 15 (ref 5–15)
BUN: 56 mg/dL — ABNORMAL HIGH (ref 6–20)
CO2: 25 mmol/L (ref 22–32)
Calcium: 9.5 mg/dL (ref 8.9–10.3)
Chloride: 99 mmol/L (ref 98–111)
Creatinine, Ser: 17.84 mg/dL — ABNORMAL HIGH (ref 0.61–1.24)
GFR, Estimated: 3 mL/min — ABNORMAL LOW (ref >60)
Glucose, Bld: 66 mg/dL — ABNORMAL LOW (ref 70–99)
Phosphorus: 5.9 mg/dL — ABNORMAL HIGH (ref 2.5–4.6)
Potassium: 4.8 mmol/L (ref 3.5–5.1)
Sodium: 139 mmol/L (ref 135–145)

## 2023-06-27 LAB — PREPARE RBC (CROSSMATCH)

## 2023-06-27 LAB — HEPARIN LEVEL (UNFRACTIONATED): Heparin Unfractionated: 0.37 [IU]/mL (ref 0.30–0.70)

## 2023-06-27 LAB — HEPATITIS B SURFACE ANTIBODY, QUANTITATIVE: Hep B S AB Quant (Post): 123 m[IU]/mL

## 2023-06-27 MED ORDER — CHLORHEXIDINE GLUCONATE 0.12 % MT SOLN
15.0000 mL | Freq: Once | OROMUCOSAL | Status: AC
Start: 2023-06-28 — End: 2023-06-28
  Administered 2023-06-28: 15 mL via OROMUCOSAL
  Filled 2023-06-27: qty 15

## 2023-06-27 MED ORDER — CHLORHEXIDINE GLUCONATE CLOTH 2 % EX PADS
6.0000 | MEDICATED_PAD | Freq: Once | CUTANEOUS | Status: AC
  Administered 2023-06-27: 6 via TOPICAL

## 2023-06-27 MED ORDER — CHLORHEXIDINE GLUCONATE CLOTH 2 % EX PADS
6.0000 | MEDICATED_PAD | Freq: Once | CUTANEOUS | Status: AC
  Administered 2023-06-28: 6 via TOPICAL

## 2023-06-27 MED ORDER — METOPROLOL TARTRATE 12.5 MG HALF TABLET
12.5000 mg | ORAL_TABLET | Freq: Once | ORAL | Status: AC
  Administered 2023-06-28: 12.5 mg via ORAL
  Filled 2023-06-27: qty 1

## 2023-06-27 MED ORDER — TEMAZEPAM 15 MG PO CAPS: 15.0000 mg | ORAL_CAPSULE | Freq: Once | ORAL | Status: DC | PRN

## 2023-06-27 MED ORDER — BISACODYL 5 MG PO TBEC: 5.0000 mg | DELAYED_RELEASE_TABLET | Freq: Once | ORAL | Status: DC

## 2023-06-27 NOTE — Progress Notes (Addendum)
 [facility] PROGRESS NOTE    Progress Note  Corey Park  FMW:[id number] DOB: 1970/08/24 DOA: 06/25/2023 PCP: Lorren Greig PARAS, NP     Brief Narrative:   Corey Park is an 53 y.o. male past medical history significant for end-stage renal disease on hemodialysis Monday Wednesday and Friday, diabetes mellitus type 2, essential hypertension anemia of chronic disease with a baseline hemoglobin of 10-13, chronically elevated troponin in the setting of an stage renal disease coming to the hospital complaining of chest pain. Assessment/Plan:   NSTEMI (non-ST elevated myocardial infarction)/Elevated troponin Chest pain with associated diaphoresis and shortness of breath dizziness. Left heart cath was done on 06/25/2023 that showed triple-vessel disease. Continue IV heparin , nitroglycerin , beta-blocker, statins and aspirin . CT surgery was consulted procedural intervention 06/28/2023.  Cardiology and CT surgery on board.  Acute respiratory failure with hypoxia: Has been weaned to room air.  End-stage renal disease on hemodialysis [facility]) Renal has been consulted resume his home regimen.  DM2 (diabetes mellitus, type 2) (HCC): Currently on carb modified diet as required minimal insulin  continue CBGs before meals and at bedtime.  Essential hypertension: Continue strict I's and O's and daily weights. Currently on Coreg  and Avapro .  Anemia of chronic renal disease: Hemoglobin has remained relatively stable.  Legally blind: Noted  GERD: Continue PPI.   DVT prophylaxis: heparin  Family Communication:none Status is: Inpatient Remains inpatient appropriate because: ACS    Code Status:     Code Status Orders  (From admission, onward)           Start     Ordered   06/25/23 0331  Full code  Continuous       Question:  By:  Answer:  Consent: discussion documented in EHR   06/25/23 0331           Code Status History     Date Active Date Inactive Code Status  Order ID Comments User Context   05/09/2023 1622 05/11/2023 2342 Full Code [id number]  Kandis Devaughn Sayres, MD ED   04/02/2023 0335 04/03/2023 1747 Full Code [id number]  Zierle-Ghosh, Asia B, DO ED   01/03/2023 1439 01/04/2023 2146 Full Code [id number]  Fernand Prost, MD Inpatient   12/11/2022 0952 12/14/2022 1954 Full Code [id number]  Tobie Gaines, DO ED   10/19/2022 1844 10/20/2022 1855 Full Code [id number]  Homer Shams, MD ED   09/12/2022 0831 09/13/2022 2141 Full Code [id number]  Macario Craven, MD ED   08/05/2022 0558 08/08/2022 1507 Full Code [id number]  Waymond Sieving, MD ED   04/17/2022 0741 04/19/2022 2057 Full Code [id number]  Claudene Maximino LABOR, MD ED   11/26/2019 2354 11/28/2019 2151 Full Code [id number]  Sonjia Held, MD Inpatient   07/27/2019 2137 08/03/2019 1741 Full Code [id number]  Ricky Alfrieda DASEN, DO ED   12/21/2018 0514 12/24/2018 2231 Full Code [id number]  Sim Emery CROME, MD ED   06/16/2018 1904 06/19/2018 1610 Full Code [id number]  Viviana Elveria PARAS, MD ED   12/12/2016 0210 12/14/2016 1616 Full Code [id number]  Silvester Ales, MD Inpatient         IV Access:   Peripheral IV   Procedures and diagnostic studies:   No results found.    Medical Consultants:   None.   Subjective:    Corey Park chest pain is resolved  Objective:    Vitals:   06/26/23 2329 06/27/23 0551 06/27/23 0600 06/27/23 0812  BP: 102/88 127/82    Pulse:  81  99  Resp: 19 17    Temp: 98.3  F (36.8 C) (!) 97.5 F (36.4 C)  98.5 F (36.9 C)  TempSrc: Oral Oral  Oral  SpO2:  100%  98%  Weight:   98.8 kg   Height:       SpO2: 98 % O2 Flow Rate (L/min): 1 L/min   Intake/Output Summary (Last 24 hours) at 06/27/2023 1102 Last data filed at 06/27/2023 0900 Gross per 24 hour  Intake 1122.95 ml  Output 350 ml  Net 772.95 ml   Filed Weights   06/25/23 0157 06/27/23 0600  Weight: 98 kg 98.8 kg    Exam: General exam: In no acute distress. Respiratory system: Good air movement and clear to  auscultation. Cardiovascular system: S1 & S2 heard, RRR. No JVD. Gastrointestinal system: Abdomen is nondistended, soft and nontender.  Extremities: No pedal edema. Skin: No rashes, lesions or ulcers Psychiatry: Judgement and insight appear normal. Mood & affect appropriate.  Data Reviewed:    Labs: Basic Metabolic Panel: Recent Labs  Lab 06/25/23 0203 06/25/23 0208 06/25/23 0448 06/26/23 0416  NA 140 139  140 140 137  K 4.5 4.4  4.4 4.8 4.7  CL 97* 99 98 98  CO2 24  --  25 25  GLUCOSE 156* 155* 175* 96  BUN 44* 44* 45* 48*  CREATININE 14.45* 15.40* 14.50* 16.34*  CALCIUM  9.3  --  8.7* 8.8*  MG  --   --  2.0  --   PHOS  --   --  7.5*  --    GFR Estimated Creatinine Clearance: 6.5 mL/min (A) (by C-G formula based on SCr of 16.34 mg/dL (H)). Liver Function Tests: Recent Labs  Lab 06/25/23 0203  AST 25  ALT 15  ALKPHOS 79  BILITOT 0.7  PROT 8.3*  ALBUMIN  3.6   No results for input(s): LIPASE, AMYLASE in the last 168 hours. No results for input(s): AMMONIA in the last 168 hours. Coagulation profile No results for input(s): INR, PROTIME in the last 168 hours. COVID-19 Labs  No results for input(s): DDIMER, FERRITIN, LDH, CRP in the last 72 hours.  Lab Results  Component Value Date   SARSCOV2NAA NEGATIVE 04/02/2023   SARSCOV2NAA NEGATIVE 01/03/2023   SARSCOV2NAA NEGATIVE 09/12/2022   SARSCOV2NAA NEGATIVE 04/17/2022    CBC: Recent Labs  Lab 06/25/23 0203 06/25/23 0208 06/26/23 0416 06/27/23 0405  WBC 9.6  --  8.0 8.3  NEUTROABS 6.6  --   --   --   HGB 12.8* 13.3  12.9* 10.4* 10.2*  HCT 38.7* 39.0  38.0* 31.1* 30.1*  MCV 97.2  --  95.7 94.7  PLT 177  --  137* 140*   Cardiac Enzymes: No results for input(s): CKTOTAL, CKMB, CKMBINDEX, TROPONINI in the last 168 hours. BNP (last 3 results) No results for input(s): PROBNP in the last 8760 hours. CBG: Recent Labs  Lab 06/26/23 0616 06/26/23 1222 06/26/23 1711  06/26/23 2320 06/27/23 0547  GLUCAP 84 125* 92 190* 89   D-Dimer: No results for input(s): DDIMER in the last 72 hours. Hgb A1c: Recent Labs    06/26/23 0416  HGBA1C 5.8*   Lipid Profile: No results for input(s): CHOL, HDL, LDLCALC, TRIG, CHOLHDL, LDLDIRECT in the last 72 hours. Thyroid function studies: No results for input(s): TSH, T4TOTAL, T3FREE, THYROIDAB in the last 72 hours.  Invalid input(s): FREET3 Anemia work up: No results for input(s): VITAMINB12, FOLATE, FERRITIN, TIBC, IRON , RETICCTPCT in the last 72 hours. Sepsis Labs: Recent Labs  Lab 06/25/23 0203 06/25/23 0448 06/26/23 0416 06/27/23 0405  PROCALCITON  --  0.40  --   --   WBC 9.6  --  8.0 8.3   Microbiology No results found for this or any previous visit (from the past 240 hours).   Medications:    aspirin   325 mg Oral Daily   atorvastatin   80 mg Oral Daily   calcium  acetate  2,001 mg Oral TID with meals   carvedilol   12.5 mg Oral BID WC   Chlorhexidine  Gluconate Cloth  6 each Topical Q0600   [START ON 06/28/2023] epinephrine   0-10 mcg/min Intravenous To OR   famotidine   20 mg Oral Daily   [START ON 06/28/2023] heparin  sodium (porcine) 5,000 Units, papaverine  60 mg in electrolyte-A (PLASMALYTE-A PH 7.4) 1,000 mL irrigation   Irrigation To OR   insulin  aspart  0-6 Units Subcutaneous Q6H   [START ON 06/28/2023] insulin    Intravenous To OR   irbesartan   150 mg Oral Daily   [START ON 06/28/2023] [place] Blood Cardioplegia vial (lidocaine /magnesium /mannitol  0.26g-4g-6.4g)   Intracoronary To OR   pantoprazole  (PROTONIX ) IV  40 mg Intravenous Daily   [START ON 06/28/2023] phenylephrine   30-200 mcg/min Intravenous To OR   [START ON 06/28/2023] potassium chloride   80 mEq Other To OR   [START ON 06/28/2023] tranexamic acid   15 mg/kg Intravenous To OR   [START ON 06/28/2023] tranexamic acid   2 mg/kg Intracatheter To OR   Continuous Infusions:  [START ON 06/28/2023]  ceFAZolin  (ANCEF )  IV     [START ON 06/28/2023]  ceFAZolin  (ANCEF ) IV     [START ON 06/28/2023] dexmedetomidine      [START ON 06/28/2023] heparin  30,000 units/NS 1000 mL solution for CELLSAVER     heparin  1,400 Units/hr (06/26/23 1846)   [START ON 06/28/2023] milrinone      nitroGLYCERIN  15 mcg/min (06/26/23 2145)   [START ON 06/28/2023] nitroGLYCERIN      [START ON 06/28/2023] norepinephrine      [START ON 06/28/2023] tranexamic acid  (CYKLOKAPRON ) 2,500 mg in sodium chloride  0.9 % 250 mL (10 mg/mL) infusion     [START ON 06/28/2023] vancomycin         LOS: 2 days   Erle Odell Castor  [facility]  06/27/2023, 11:02 AM

## 2023-06-27 NOTE — Progress Notes (Signed)
 VASCULAR LAB    Patient went to hemodialysis at 13:45 before Pre CABG could be done. Will not be completed with dialysis until approximately 18:45.  Vascular lab will attempt as schedule permits.  Breindel Collier, RVT 06/27/2023, 5:09 PM

## 2023-06-27 NOTE — Progress Notes (Signed)
 Pre-CABG exam has been completed.   Results can be found under chart review under CV PROC. 06/27/2023 8:33 PM Jumar Greenstreet RVT, RDMS

## 2023-06-27 NOTE — Progress Notes (Signed)
 PHARMACY - ANTICOAGULATION CONSULT NOTE  Pharmacy Consult for Heparin  Indication: chest pain/ACS; multivessel disease s/p cath  Allergies  Allergen Reactions   Imdur  [Isosorbide  Nitrate] Other (See Comments)    Endorsed headache in the past - willing to retry    Patient Measurements: Height: 6' 1 (185.4 cm) Weight: 98.8 kg (217 lb 12.7 oz) IBW/kg (Calculated) : 79.9 Heparin  Dosing Weight: HEPARIN  DW (KG): 98 kg   Vital Signs: Temp: 98.5 F (36.9 C) (01/01 0812) Temp Source: Oral (01/01 0812) BP: 127/82 (01/01 0551) Pulse Rate: 99 (01/01 0812)  Labs: Recent Labs    06/25/23 0203 06/25/23 0208 06/25/23 0448 06/25/23 0813 06/26/23 0416 06/26/23 1504 06/27/23 0405  HGB 12.8* 13.3  12.9*  --   --  10.4*  --  10.2*  HCT 38.7* 39.0  38.0*  --   --  31.1*  --  30.1*  PLT 177  --   --   --  137*  --  140*  HEPARINUNFRC  --   --   --   --  0.25* 0.38 0.37  CREATININE 14.45* 15.40* 14.50*  --  16.34*  --   --   TROPONINIHS 860*  --  4,749* 10,982*  --   --   --     Estimated Creatinine Clearance: 6.5 mL/min (A) (by C-G formula based on SCr of 16.34 mg/dL (H)).   Medical History: Past Medical History:  Diagnosis Date   Allergy, unspecified, initial encounter 08/25/2019   Asthma    as a child   Cellulitis, perineum 11/26/2019   CHF (congestive heart failure) (HCC) 2021   Complication of vascular dialysis catheter 08/25/2019   COVID-19 virus infection 07/27/2019   Diabetes mellitus without complication (HCC)    type 2   Dilated cardiomyopathy (HCC) 07/03/2018   ESRD on hemodialysis (HCC) 06/17/2018   MWF at [facility]   Fe deficiency anemia 12/23/2018   Hypertension    Legally blind    B/L   Pneumonia 2021   Type 2 diabetes mellitus with diabetic peripheral angiopathy without gangrene (HCC) 08/25/2019   Unspecified protein-calorie malnutrition (HCC) 08/25/2019    Medications:  Medications Prior to Admission  Medication Sig Dispense Refill Last Dose/Taking   alum  & mag hydroxide-simeth (MAALOX/MYLANTA) 200-200-20 MG/5ML suspension Take 30 mLs by mouth every 6 (six) hours as needed for indigestion or heartburn.   06/24/2023 Bedtime   amLODipine  (NORVASC ) 5 MG tablet Take 1 tablet (5 mg total) by mouth daily. 30 tablet 0 Past Month   calcium  acetate (PHOSLO ) 667 MG capsule Take 2 capsules (1,334 mg total) by mouth with breakfast, with lunch, and with evening meal. (Patient taking differently: Take 2,001 mg by mouth with breakfast, with lunch, and with evening meal.) 180 capsule 0 06/24/2023   carvedilol  (COREG ) 25 MG tablet Take 1 tablet (25 mg total) by mouth 2 (two) times daily with a meal. 60 tablet 0 06/24/2023   famotidine  (PEPCID ) 20 MG tablet Take 20 mg by mouth daily.   06/24/2023   ferric citrate  (AURYXIA ) 1 GM 210 MG(Fe) tablet Take 2 tablets (420 mg total) by mouth daily. (Patient taking differently: Take 420 mg by mouth 3 (three) times daily with meals.) 60 tablet 0 Past Week   gabapentin  (NEURONTIN ) 100 MG capsule Take 1 capsule (100 mg total) by mouth 2 (two) times daily. (Patient taking differently: Take 100 mg by mouth 3 (three) times daily as needed (hiccups).)   Taking Differently   multivitamin (RENA-VIT) TABS tablet Take 1 tablet  by mouth daily.   06/24/2023   olmesartan  (BENICAR ) 20 MG tablet Take 1 tablet (20 mg total) by mouth daily. 30 tablet 0 06/24/2023   pantoprazole  (PROTONIX ) 20 MG tablet Take 20 mg by mouth daily.   06/24/2023   atorvastatin  (LIPITOR ) 80 MG tablet Take 1 tablet (80 mg total) by mouth daily. (Patient not taking: Reported on 06/25/2023) 30 tablet 0 Not Taking   nitroGLYCERIN  (NITROSTAT ) 0.4 MG SL tablet Place 0.4 mg under the tongue every 5 (five) minutes as needed for chest pain. (Patient not taking: Reported on 05/09/2023)   Not Taking    Assessment: 53 yo M admitted overnight with CP and started on heparin  infusion.  Pt is now s/p cardiac cath with multivessel disease and pending TCTS consult.  Pharmacy consulted  to resume heparin  2 hrs post- TR band removal (removed at 1330).  This morning, heparin  level is therapeutic at 0.37 @ 1400 units/hr. CBC okay - hgb 10.2 nad plts 140. No bleeding or complications noted.  Goal of Therapy:  Heparin  level 0.3-0.7 units/ml Monitor platelets by anticoagulation protocol: Yes   Plan:  Continue IV heparin  at 1400 units/hr. Daily heparin  level and CBC.  Lum Laine, PharmD PGY1 Pharmacy Resident 06/27/2023 9:46 AM

## 2023-06-27 NOTE — Progress Notes (Signed)
 Phlebotomist reported that patient refused to have labs drawn.  This nurse reported refusal to the physician team.

## 2023-06-27 NOTE — Plan of Care (Signed)
   Problem: Education: Goal: Knowledge of General Education information will improve Description Including pain rating scale, medication(s)/side effects and non-pharmacologic comfort measures Outcome: Progressing

## 2023-06-27 NOTE — Progress Notes (Signed)
   Patient Name: Corey Park Date of Encounter: 06/27/2023 [facility] Cardiologist: Oneil Parchment, MD   Interval Summary  .    Mild episodes of chest pain, overall remains comfortable. On heparin , NTG  Vital Signs .    Vitals:   06/26/23 2329 06/27/23 0551 06/27/23 0600 06/27/23 0812  BP: 102/88 127/82    Pulse:  81  99  Resp: 19 17    Temp: 98.3 F (36.8 C) (!) 97.5 F (36.4 C)  98.5 F (36.9 C)  TempSrc: Oral Oral  Oral  SpO2:  100%  98%  Weight:   98.8 kg   Height:        Intake/Output Summary (Last 24 hours) at 06/27/2023 0828 Last data filed at 06/26/2023 2000 Gross per 24 hour  Intake 1122.95 ml  Output 150 ml  Net 972.95 ml      06/27/2023    6:00 AM 06/25/2023    1:57 AM 06/01/2023    6:33 AM  Last 3 Weights  Weight (lbs) 217 lb 12.7 oz 216 lb 216 lb 0.8 oz  Weight (kg) 98.79 kg 97.977 kg 98 kg      Telemetry/ECG    Sinus Rhythm - Personally Reviewed  Physical Exam .   GEN: No acute distress.   Neck: No JVD Cardiac: RRR, no murmurs, rubs, or gallops.  Respiratory: Clear to auscultation bilaterally. GI: Soft, nontender, non-distended  MS: No edema Skin: left radial cath site stable Fistula for dialysis RUE  Assessment & Plan .     53 y/o male w/hypertension, type 2 diabetes mellitus, ESRD on HD, moderate CAD (cath 11/2022), admitted with chest pain, NSTEMI  NSTEMI -- presented with chest pain and found to have elevated troponin with peak 89017. 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

## 2023-06-27 NOTE — Progress Notes (Signed)
 [facility] Progress Note   Subjective:   Patient has been on nitroglycerin  drip and patients are unable to dialyze in the HD unit while on nitroglycerin  drip. HD staff is not available for bedside HD at the moment. Discussed with Dr. Rayburn who recommends stopping the nitroglycerin  drip while patient is receiving dialysis then resuming it once he returns to the floor. Update provided to primary MD who is in agreement. HD charge RN aware of situation and is prioritizing him for next shift. Patient is very upset that he has not had dialysis yet as his last HD was Friday. Reassured that his potassium and respiratory status have been checked regularly and have been stable (last K+ 4.7). He refused RFP today. He is currently on room air and denies any SOB, last O2 sat was 98%. Reassured that the team is working on getting him to dialysis as soon as possible. Reports ongoing chest pain off and on. Denies HA, dizziness, nausea. Offered to call patient care advocate for him which he declines.   Objective Vitals:   06/26/23 2329 06/27/23 0551 06/27/23 0600 06/27/23 0812  BP: 102/88 127/82    Pulse:  81  99  Resp: 19 17    Temp: 98.3 F (36.8 C) (!) 97.5 F (36.4 C)  98.5 F (36.9 C)  TempSrc: Oral Oral  Oral  SpO2:  100%  98%  Weight:   98.8 kg   Height:       Physical Exam General: Alert male, seated on bedside, no acute distress Heart: RRR, no murmurs, rubs or gallops Lungs: CTA bilaterally, respirations unlabored on RA Abdomen: Soft, +BS Extremities: No edema b/l lower extremities Dialysis Access: AVF + bruit  Additional Objective Labs: Basic Metabolic Panel: Recent Labs  Lab 06/25/23 0203 06/25/23 0208 06/25/23 0448 06/26/23 0416  NA 140 139  140 140 137  K 4.5 4.4  4.4 4.8 4.7  CL 97* 99 98 98  CO2 24  --  25 25  GLUCOSE 156* 155* 175* 96  BUN 44* 44* 45* 48*  CREATININE 14.45* 15.40* 14.50* 16.34*  CALCIUM  9.3  --  8.7* 8.8*  PHOS  --   --  7.5*  --     Liver Function Tests: Recent Labs  Lab 06/25/23 0203  AST 25  ALT 15  ALKPHOS 79  BILITOT 0.7  PROT 8.3*  ALBUMIN  3.6   No results for input(s): LIPASE, AMYLASE in the last 168 hours. CBC: Recent Labs  Lab 06/25/23 0203 06/25/23 0208 06/26/23 0416 06/27/23 0405  WBC 9.6  --  8.0 8.3  NEUTROABS 6.6  --   --   --   HGB 12.8* 13.3  12.9* 10.4* 10.2*  HCT 38.7* 39.0  38.0* 31.1* 30.1*  MCV 97.2  --  95.7 94.7  PLT 177  --  137* 140*   Blood Culture    Component Value Date/Time   SDES BLOOD LEFT ANTECUBITAL 05/09/2023 1822   SPECREQUEST  05/09/2023 1822    BOTTLES DRAWN AEROBIC ONLY Blood Culture adequate volume   CULT  05/09/2023 1822    NO GROWTH 5 DAYS Performed at [facility], [address], [place], [place] [address]    REPTSTATUS 05/14/2023 FINAL 05/09/2023 1822    CBG: Recent Labs  Lab 06/26/23 0616 06/26/23 1222 06/26/23 1711 06/26/23 2320 06/27/23 0547  GLUCAP 84 125* 92 190* 89    Medications:  [START ON 06/28/2023]  ceFAZolin  (ANCEF ) IV     [START ON 06/28/2023]  ceFAZolin  (ANCEF ) IV     [START ON 06/28/2023] dexmedetomidine      [START ON 06/28/2023] heparin  30,000 units/NS 1000 mL solution for CELLSAVER     heparin  1,400 Units/hr (06/26/23 1846)   [START ON 06/28/2023] milrinone      nitroGLYCERIN  15 mcg/min (06/26/23 2145)   [START ON 06/28/2023] nitroGLYCERIN      [START ON 06/28/2023] norepinephrine      [START ON 06/28/2023] tranexamic acid  (CYKLOKAPRON ) 2,500 mg in sodium chloride  0.9 % 250 mL (10 mg/mL) infusion     [START ON 06/28/2023] vancomycin       aspirin   325 mg Oral Daily   atorvastatin   80 mg Oral Daily   calcium  acetate  2,001 mg Oral TID with meals   carvedilol   12.5 mg Oral BID WC   Chlorhexidine  Gluconate Cloth  6 each Topical Q0600   [START ON 06/28/2023] epinephrine   0-10 mcg/min Intravenous To OR   famotidine   20 mg Oral Daily   [START ON 06/28/2023] heparin  sodium (porcine) 5,000 Units, papaverine  60 mg in electrolyte-A  (PLASMALYTE-A PH 7.4) 1,000 mL irrigation   Irrigation To OR   insulin  aspart  0-6 Units Subcutaneous Q6H   [START ON 06/28/2023] insulin    Intravenous To OR   irbesartan   150 mg Oral Daily   [START ON 06/28/2023] [place] Blood Cardioplegia vial (lidocaine /magnesium /mannitol  0.26g-4g-6.4g)   Intracoronary To OR   pantoprazole  (PROTONIX ) IV  40 mg Intravenous Daily   [START ON 06/28/2023] phenylephrine   30-200 mcg/min Intravenous To OR   [START ON 06/28/2023] potassium chloride   80 mEq Other To OR   [START ON 06/28/2023] tranexamic acid   15 mg/kg Intravenous To OR   [START ON 06/28/2023] tranexamic acid   2 mg/kg Intracatheter To OR    Dialysis Orders: Outpatient dialysis orders: [facility] MWF 180NRe 4 hours BFR 450 DFR Auto 1.5 EDW 102.3kg 2K 2Ca AVF 15g No heparin  Mircera 50mcg IV q 2 weeks- last dose 06/19/23 Calcitriol  1.5 mcg PO q HD  Assessment/Plan:  NSTEMI: 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 On IV heparin  and nitroglycerin . Plans for CABG tomorrow per notes. Management per admitting team/cardiology  ESRD:  Typically on MWF schedule, off schedule this week due to holiday/missed HD on Sunday. HD delayed today due to nitroglycerin  drip as above. After discussing with PMD and nephrology attending, plan is to stop nitroglycerin  drip during dialysis then resume when he gets back to the floor. Respiratory status stable, last K+ at goal, pt refused RFP today. Planned for HD next shift and will check labs with HD.   Hypertension/volume: BP elevated, may be partially due to stress/volume. Monitor post HD. Continue current medications as per cardiology. Not in respiratory distress, no edema on exam. Will try to get more accurate standing weight when able.   Anemia: Hgb 10.2. ESA recently dosed, continue to monitor  Metabolic bone disease: Calcium  controlled, phos elevated. Continue VDRA and binders  Nutrition:  Alb 3.6, will need renal diet/protein supplements once tolerating PO     Bret Vanessen, PA-C 06/27/2023, 10:13 AM  [facility] Pager: [phone]

## 2023-06-27 NOTE — Progress Notes (Signed)
   06/27/23 1805  Vitals  Pulse Rate 87  Resp 19  BP (!) 160/95  SpO2 100 %   Received patient in bed to unit.  Alert and oriented.  Informed consent signed and in chart.  Patient requested to end tx 18 minutes early, AMA signed.  TX duration:3.12  Patient tolerated well.  Transported back to the room  Alert, without acute distress.  Hand-off given to patient's nurse.   Access used: AVF Access issues: none  Total UF removed: Medication(s) given: See MAR    Geni Seip, RN [facility]

## 2023-06-27 NOTE — Anesthesia Preprocedure Evaluation (Addendum)
 Anesthesia Evaluation  Patient identified by MRN, date of birth, ID band Patient awake    Reviewed: Allergy & Precautions, NPO status , Patient's Chart, lab work & pertinent test results  History of Anesthesia Complications Negative for: history of anesthetic complications  Airway Mallampati: III  TM Distance: >3 FB Neck ROM: Full    Dental  (+) Missing, Poor Dentition, Chipped, Dental Advisory Given   Pulmonary asthma , pneumonia, COPD,  COPD inhaler   Pulmonary exam normal breath sounds clear to auscultation       Cardiovascular hypertension, Pt. on medications (-) angina + CAD (11/2022 Cath: Severe coronary calcification.  Nonobstructive disease throughout the left main, LAD and circumflex.  Continue medical therapy.), + Past MI, + Peripheral Vascular Disease and +CHF  Normal cardiovascular exam Rhythm:Regular Rate:Normal  Echo 06/25/23  1. Left ventricular ejection fraction, by estimation, is 40 to 45%. The left ventricle has mildly decreased function. The left ventricle demonstrates regional wall motion abnormalities (see scoring diagram/findings for description). Left ventricular diastolic parameters are consistent with Grade II diastolic dysfunction  (pseudonormalization). GLS -8.6%.   2. Right ventricular systolic function is normal. The right ventricular size is normal.   3. The mitral valve is normal in structure. No evidence of mitral valve regurgitation. No evidence of mitral stenosis.   4. The aortic valve is normal in structure. Aortic valve regurgitation is not visualized. No aortic stenosis is present.   5. The inferior vena cava is normal in size with greater than 50% respiratory variability, suggesting right atrial pressure of 3 mmHg.     03/2023 ECHO: EF 55 to 60%.  1. The LV has normal function, global hypokinesis. There is mild LVH.    2. RVF is normal. The right ventricular size is normal.   3. There is a  echodense mobile mass (1.0 cm x 0.49 cm) noted on the posterior leaflet of the mitral valve. Calcification vs vegetation, may need TEE for clarification. Clinical correlation will be beneficial. The mitral valve is degenerative. No evidence of mitral valve regurgitation. No evidence of mitral stenosis. Moderate mitral annular calcification.   4. The aortic valve is normal in structure. Aortic valve regurgitation is not visualized. No aortic stenosis is present.     Neuro/Psych negative neurological ROS     GI/Hepatic Neg liver ROS,GERD  Medicated and Controlled,,  Endo/Other  diabetes    Renal/GU Dialysis and ESRFRenal disease (K+ 4.1)     Musculoskeletal   Abdominal  (+) + obese  Peds  Hematology  (+) Blood dyscrasia, anemia Hb 11.3, plt 178k   Anesthesia Other Findings   Reproductive/Obstetrics                             Anesthesia Physical Anesthesia Plan  ASA: 4  Anesthesia Plan: General   Post-op Pain Management:    Induction: Intravenous  PONV Risk Score and Plan: 3 and Treatment may vary due to age or medical condition  Airway Management Planned: Oral ETT  Additional Equipment: Arterial line, CVP, TEE and Ultrasound Guidance Line Placement  Intra-op Plan: Utilization of Controlled Hypotension per surrgeon request  Post-operative Plan: Post-operative intubation/ventilation  Informed Consent: I have reviewed the patients History and Physical, chart, labs and discussed the procedure including the risks, benefits and alternatives for the proposed anesthesia with the patient or authorized representative who has indicated his/her understanding and acceptance.     Dental advisory given  Plan Discussed with: CRNA  Anesthesia Plan Comments:         Anesthesia Quick Evaluation

## 2023-06-27 NOTE — Progress Notes (Signed)
 Patient B/S was 67, snack was given to patient and patient starting to eat it when transport arrived, unable to recheck B/S. Primary Nurse made aware.

## 2023-06-28 ENCOUNTER — Inpatient Hospital Stay (HOSPITAL_COMMUNITY): Payer: Medicare Other | Admitting: Certified Registered Nurse Anesthetist

## 2023-06-28 ENCOUNTER — Encounter (HOSPITAL_COMMUNITY): Payer: Self-pay | Admitting: Internal Medicine

## 2023-06-28 ENCOUNTER — Inpatient Hospital Stay (HOSPITAL_COMMUNITY): Payer: Medicare Other

## 2023-06-28 ENCOUNTER — Encounter (HOSPITAL_COMMUNITY)
Admission: EM | Disposition: A | Discharge status: Home Health | Payer: Self-pay | Source: Home / Self Care | Attending: Thoracic Surgery (Cardiothoracic Vascular Surgery)

## 2023-06-28 ENCOUNTER — Other Ambulatory Visit: Payer: Self-pay

## 2023-06-28 DIAGNOSIS — I132 Hypertensive heart and chronic kidney disease with heart failure and with stage 5 chronic kidney disease, or end stage renal disease: Secondary | ICD-10-CM | POA: Diagnosis not present

## 2023-06-28 DIAGNOSIS — Z951 Presence of aortocoronary bypass graft: Secondary | ICD-10-CM

## 2023-06-28 DIAGNOSIS — I5042 Chronic combined systolic (congestive) and diastolic (congestive) heart failure: Secondary | ICD-10-CM | POA: Diagnosis not present

## 2023-06-28 DIAGNOSIS — N186 End stage renal disease: Secondary | ICD-10-CM | POA: Diagnosis not present

## 2023-06-28 DIAGNOSIS — I2510 Atherosclerotic heart disease of native coronary artery without angina pectoris: Secondary | ICD-10-CM

## 2023-06-28 DIAGNOSIS — I214 Non-ST elevation (NSTEMI) myocardial infarction: Secondary | ICD-10-CM | POA: Diagnosis not present

## 2023-06-28 HISTORY — PX: TEE WITHOUT CARDIOVERSION: SHX5443

## 2023-06-28 HISTORY — PX: CORONARY ARTERY BYPASS GRAFT: SHX141

## 2023-06-28 LAB — BASIC METABOLIC PANEL
Anion gap: 13 (ref 5–15)
Anion gap: 11 (ref 5–15)
BUN: 31 mg/dL — ABNORMAL HIGH (ref 6–20)
BUN: 37 mg/dL — ABNORMAL HIGH (ref 6–20)
CO2: 27 mmol/L (ref 22–32)
CO2: 23 mmol/L (ref 22–32)
Calcium: 8.8 mg/dL — ABNORMAL LOW (ref 8.9–10.3)
Calcium: 8.8 mg/dL — ABNORMAL LOW (ref 8.9–10.3)
Chloride: 95 mmol/L — ABNORMAL LOW (ref 98–111)
Chloride: 99 mmol/L (ref 98–111)
Creatinine, Ser: 12.3 mg/dL — ABNORMAL HIGH (ref 0.61–1.24)
Creatinine, Ser: 12.4 mg/dL — ABNORMAL HIGH (ref 0.61–1.24)
GFR, Estimated: 4 mL/min — ABNORMAL LOW (ref >60)
GFR, Estimated: 4 mL/min — ABNORMAL LOW (ref >60)
Glucose, Bld: 79 mg/dL (ref 70–99)
Glucose, Bld: 107 mg/dL — ABNORMAL HIGH (ref 70–99)
Potassium: 4.3 mmol/L (ref 3.5–5.1)
Potassium: 7.2 mmol/L (ref 3.5–5.1)
Sodium: 135 mmol/L (ref 135–145)
Sodium: 133 mmol/L — ABNORMAL LOW (ref 135–145)

## 2023-06-28 LAB — POCT I-STAT 7, (LYTES, BLD GAS, ICA,H+H)
Acid-Base Excess: 0 mmol/L (ref 0.0–2.0)
Acid-Base Excess: 0 mmol/L (ref 0.0–2.0)
Acid-Base Excess: 1 mmol/L (ref 0.0–2.0)
Acid-Base Excess: 1 mmol/L (ref 0.0–2.0)
Acid-base deficit: 3 mmol/L — ABNORMAL HIGH (ref 0.0–2.0)
Acid-base deficit: 6 mmol/L — ABNORMAL HIGH (ref 0.0–2.0)
Acid-base deficit: 2 mmol/L (ref 0.0–2.0)
Bicarbonate: 19.3 mmol/L — ABNORMAL LOW (ref 20.0–28.0)
Bicarbonate: 22.1 mmol/L (ref 20.0–28.0)
Bicarbonate: 23.3 mmol/L (ref 20.0–28.0)
Bicarbonate: 24.7 mmol/L (ref 20.0–28.0)
Bicarbonate: 25.1 mmol/L (ref 20.0–28.0)
Bicarbonate: 25.6 mmol/L (ref 20.0–28.0)
Bicarbonate: 25.9 mmol/L (ref 20.0–28.0)
Calcium, Ion: 0.95 mmol/L — ABNORMAL LOW (ref 1.15–1.40)
Calcium, Ion: 1.02 mmol/L — ABNORMAL LOW (ref 1.15–1.40)
Calcium, Ion: 1.08 mmol/L — ABNORMAL LOW (ref 1.15–1.40)
Calcium, Ion: 1.14 mmol/L — ABNORMAL LOW (ref 1.15–1.40)
Calcium, Ion: 1.16 mmol/L (ref 1.15–1.40)
Calcium, Ion: 1.19 mmol/L (ref 1.15–1.40)
Calcium, Ion: 1.23 mmol/L (ref 1.15–1.40)
HCT: 22 % — ABNORMAL LOW (ref 39.0–52.0)
HCT: 23 % — ABNORMAL LOW (ref 39.0–52.0)
HCT: 25 % — ABNORMAL LOW (ref 39.0–52.0)
HCT: 25 % — ABNORMAL LOW (ref 39.0–52.0)
HCT: 26 % — ABNORMAL LOW (ref 39.0–52.0)
HCT: 26 % — ABNORMAL LOW (ref 39.0–52.0)
HCT: 26 % — ABNORMAL LOW (ref 39.0–52.0)
Hemoglobin: 7.5 g/dL — ABNORMAL LOW (ref 13.0–17.0)
Hemoglobin: 7.8 g/dL — ABNORMAL LOW (ref 13.0–17.0)
Hemoglobin: 8.5 g/dL — ABNORMAL LOW (ref 13.0–17.0)
Hemoglobin: 8.5 g/dL — ABNORMAL LOW (ref 13.0–17.0)
Hemoglobin: 8.8 g/dL — ABNORMAL LOW (ref 13.0–17.0)
Hemoglobin: 8.8 g/dL — ABNORMAL LOW (ref 13.0–17.0)
Hemoglobin: 8.8 g/dL — ABNORMAL LOW (ref 13.0–17.0)
O2 Saturation: 100 %
O2 Saturation: 100 %
O2 Saturation: 100 %
O2 Saturation: 93 %
O2 Saturation: 96 %
O2 Saturation: 97 %
O2 Saturation: 99 %
Patient temperature: 36.7
Patient temperature: 36.9
Patient temperature: 36.9
Patient temperature: 37
Patient temperature: 37
Potassium: 5.3 mmol/L — ABNORMAL HIGH (ref 3.5–5.1)
Potassium: 5.9 mmol/L — ABNORMAL HIGH (ref 3.5–5.1)
Potassium: 6 mmol/L — ABNORMAL HIGH (ref 3.5–5.1)
Potassium: 6.5 mmol/L (ref 3.5–5.1)
Potassium: 6.6 mmol/L (ref 3.5–5.1)
Potassium: 6.7 mmol/L (ref 3.5–5.1)
Potassium: 6.8 mmol/L (ref 3.5–5.1)
Sodium: 131 mmol/L — ABNORMAL LOW (ref 135–145)
Sodium: 132 mmol/L — ABNORMAL LOW (ref 135–145)
Sodium: 133 mmol/L — ABNORMAL LOW (ref 135–145)
Sodium: 134 mmol/L — ABNORMAL LOW (ref 135–145)
Sodium: 134 mmol/L — ABNORMAL LOW (ref 135–145)
Sodium: 134 mmol/L — ABNORMAL LOW (ref 135–145)
Sodium: 137 mmol/L (ref 135–145)
TCO2: 20 mmol/L — ABNORMAL LOW (ref 22–32)
TCO2: 23 mmol/L (ref 22–32)
TCO2: 25 mmol/L (ref 22–32)
TCO2: 26 mmol/L (ref 22–32)
TCO2: 26 mmol/L (ref 22–32)
TCO2: 27 mmol/L (ref 22–32)
TCO2: 27 mmol/L (ref 22–32)
pCO2 arterial: 35.4 mm[Hg] (ref 32–48)
pCO2 arterial: 36.6 mm[Hg] (ref 32–48)
pCO2 arterial: 37.1 mm[Hg] (ref 32–48)
pCO2 arterial: 37.8 mm[Hg] (ref 32–48)
pCO2 arterial: 40.5 mm[Hg] (ref 32–48)
pCO2 arterial: 42.4 mm[Hg] (ref 32–48)
pCO2 arterial: 47 mm[Hg] (ref 32–48)
pH, Arterial: 7.344 — ABNORMAL LOW (ref 7.35–7.45)
pH, Arterial: 7.345 — ABNORMAL LOW (ref 7.35–7.45)
pH, Arterial: 7.347 — ABNORMAL LOW (ref 7.35–7.45)
pH, Arterial: 7.388 (ref 7.35–7.45)
pH, Arterial: 7.414 (ref 7.35–7.45)
pH, Arterial: 7.43 (ref 7.35–7.45)
pH, Arterial: 7.431 (ref 7.35–7.45)
pO2, Arterial: 148 mm[Hg] — ABNORMAL HIGH (ref 83–108)
pO2, Arterial: 253 mm[Hg] — ABNORMAL HIGH (ref 83–108)
pO2, Arterial: 326 mm[Hg] — ABNORMAL HIGH (ref 83–108)
pO2, Arterial: 338 mm[Hg] — ABNORMAL HIGH (ref 83–108)
pO2, Arterial: 71 mm[Hg] — ABNORMAL LOW (ref 83–108)
pO2, Arterial: 86 mm[Hg] (ref 83–108)
pO2, Arterial: 96 mm[Hg] (ref 83–108)

## 2023-06-28 LAB — POCT I-STAT, CHEM 8
BUN: 34 mg/dL — ABNORMAL HIGH (ref 6–20)
BUN: 31 mg/dL — ABNORMAL HIGH (ref 6–20)
BUN: 33 mg/dL — ABNORMAL HIGH (ref 6–20)
BUN: 34 mg/dL — ABNORMAL HIGH (ref 6–20)
BUN: 34 mg/dL — ABNORMAL HIGH (ref 6–20)
Calcium, Ion: 1.16 mmol/L (ref 1.15–1.40)
Calcium, Ion: 1.06 mmol/L — ABNORMAL LOW (ref 1.15–1.40)
Calcium, Ion: 0.96 mmol/L — ABNORMAL LOW (ref 1.15–1.40)
Calcium, Ion: 0.99 mmol/L — ABNORMAL LOW (ref 1.15–1.40)
Calcium, Ion: 1.34 mmol/L (ref 1.15–1.40)
Chloride: 96 mmol/L — ABNORMAL LOW (ref 98–111)
Chloride: 99 mmol/L (ref 98–111)
Chloride: 96 mmol/L — ABNORMAL LOW (ref 98–111)
Chloride: 98 mmol/L (ref 98–111)
Chloride: 100 mmol/L (ref 98–111)
Creatinine, Ser: 13.5 mg/dL — ABNORMAL HIGH (ref 0.61–1.24)
Creatinine, Ser: 13.1 mg/dL — ABNORMAL HIGH (ref 0.61–1.24)
Creatinine, Ser: 11.4 mg/dL — ABNORMAL HIGH (ref 0.61–1.24)
Creatinine, Ser: 12 mg/dL — ABNORMAL HIGH (ref 0.61–1.24)
Creatinine, Ser: 10.4 mg/dL — ABNORMAL HIGH (ref 0.61–1.24)
Glucose, Bld: 87 mg/dL (ref 70–99)
Glucose, Bld: 111 mg/dL — ABNORMAL HIGH (ref 70–99)
Glucose, Bld: 106 mg/dL — ABNORMAL HIGH (ref 70–99)
Glucose, Bld: 106 mg/dL — ABNORMAL HIGH (ref 70–99)
Glucose, Bld: 119 mg/dL — ABNORMAL HIGH (ref 70–99)
HCT: 28 % — ABNORMAL LOW (ref 39.0–52.0)
HCT: 26 % — ABNORMAL LOW (ref 39.0–52.0)
HCT: 22 % — ABNORMAL LOW (ref 39.0–52.0)
HCT: 25 % — ABNORMAL LOW (ref 39.0–52.0)
HCT: 20 % — ABNORMAL LOW (ref 39.0–52.0)
Hemoglobin: 9.5 g/dL — ABNORMAL LOW (ref 13.0–17.0)
Hemoglobin: 8.8 g/dL — ABNORMAL LOW (ref 13.0–17.0)
Hemoglobin: 7.5 g/dL — ABNORMAL LOW (ref 13.0–17.0)
Hemoglobin: 8.5 g/dL — ABNORMAL LOW (ref 13.0–17.0)
Hemoglobin: 6.8 g/dL — CL (ref 13.0–17.0)
Potassium: 5.5 mmol/L — ABNORMAL HIGH (ref 3.5–5.1)
Potassium: 6.3 mmol/L (ref 3.5–5.1)
Potassium: 6.3 mmol/L (ref 3.5–5.1)
Potassium: 6.8 mmol/L (ref 3.5–5.1)
Potassium: 5.8 mmol/L — ABNORMAL HIGH (ref 3.5–5.1)
Sodium: 135 mmol/L (ref 135–145)
Sodium: 133 mmol/L — ABNORMAL LOW (ref 135–145)
Sodium: 130 mmol/L — ABNORMAL LOW (ref 135–145)
Sodium: 137 mmol/L (ref 135–145)
Sodium: 135 mmol/L (ref 135–145)
TCO2: 33 mmol/L — ABNORMAL HIGH (ref 22–32)
TCO2: 26 mmol/L (ref 22–32)
TCO2: 28 mmol/L (ref 22–32)
TCO2: 31 mmol/L (ref 22–32)
TCO2: 24 mmol/L (ref 22–32)

## 2023-06-28 LAB — CBC
HCT: 30.5 % — ABNORMAL LOW (ref 39.0–52.0)
HCT: 23.9 % — ABNORMAL LOW (ref 39.0–52.0)
Hemoglobin: 10.3 g/dL — ABNORMAL LOW (ref 13.0–17.0)
Hemoglobin: 8.1 g/dL — ABNORMAL LOW (ref 13.0–17.0)
MCH: 32.2 pg (ref 26.0–34.0)
MCH: 32.9 pg (ref 26.0–34.0)
MCHC: 33.8 g/dL (ref 30.0–36.0)
MCHC: 33.9 g/dL (ref 30.0–36.0)
MCV: 95.3 fL (ref 80.0–100.0)
MCV: 97.2 fL (ref 80.0–100.0)
Platelets: 130 10*3/uL — ABNORMAL LOW (ref 150–400)
Platelets: 89 10*3/uL — ABNORMAL LOW (ref 150–400)
RBC: 3.2 MIL/uL — ABNORMAL LOW (ref 4.22–5.81)
RBC: 2.46 MIL/uL — ABNORMAL LOW (ref 4.22–5.81)
RDW: 16.4 % — ABNORMAL HIGH (ref 11.5–15.5)
RDW: 16.3 % — ABNORMAL HIGH (ref 11.5–15.5)
WBC: 6.8 10*3/uL (ref 4.0–10.5)
WBC: 9.4 10*3/uL (ref 4.0–10.5)
nRBC: 0 % (ref 0.0–0.2)
nRBC: 0.6 % — ABNORMAL HIGH (ref 0.0–0.2)

## 2023-06-28 LAB — CBC WITH DIFFERENTIAL/PLATELET
Abs Immature Granulocytes: 0.07 10*3/uL (ref 0.00–0.07)
Basophils Absolute: 0 10*3/uL (ref 0.0–0.1)
Basophils Relative: 0 %
Eosinophils Absolute: 0.1 10*3/uL (ref 0.0–0.5)
Eosinophils Relative: 1 %
HCT: 27.9 % — ABNORMAL LOW (ref 39.0–52.0)
Hemoglobin: 9.2 g/dL — ABNORMAL LOW (ref 13.0–17.0)
Immature Granulocytes: 1 %
Lymphocytes Relative: 6 %
Lymphs Abs: 0.6 10*3/uL — ABNORMAL LOW (ref 0.7–4.0)
MCH: 32.3 pg (ref 26.0–34.0)
MCHC: 33 g/dL (ref 30.0–36.0)
MCV: 97.9 fL (ref 80.0–100.0)
Monocytes Absolute: 1.1 10*3/uL — ABNORMAL HIGH (ref 0.1–1.0)
Monocytes Relative: 9 %
Neutro Abs: 9.8 10*3/uL — ABNORMAL HIGH (ref 1.7–7.7)
Neutrophils Relative %: 83 %
Platelets: 127 10*3/uL — ABNORMAL LOW (ref 150–400)
RBC: 2.85 MIL/uL — ABNORMAL LOW (ref 4.22–5.81)
RDW: 16.7 % — ABNORMAL HIGH (ref 11.5–15.5)
WBC: 11.7 10*3/uL — ABNORMAL HIGH (ref 4.0–10.5)
nRBC: 0.3 % — ABNORMAL HIGH (ref 0.0–0.2)

## 2023-06-28 LAB — POCT I-STAT EG7
Acid-Base Excess: 3 mmol/L — ABNORMAL HIGH (ref 0.0–2.0)
Bicarbonate: 27.1 mmol/L (ref 20.0–28.0)
Calcium, Ion: 1 mmol/L — ABNORMAL LOW (ref 1.15–1.40)
HCT: 21 % — ABNORMAL LOW (ref 39.0–52.0)
Hemoglobin: 7.1 g/dL — ABNORMAL LOW (ref 13.0–17.0)
O2 Saturation: 73 %
Potassium: 6.4 mmol/L (ref 3.5–5.1)
Sodium: 132 mmol/L — ABNORMAL LOW (ref 135–145)
TCO2: 28 mmol/L (ref 22–32)
pCO2, Ven: 39.6 mm[Hg] — ABNORMAL LOW (ref 44–60)
pH, Ven: 7.444 — ABNORMAL HIGH (ref 7.25–7.43)
pO2, Ven: 37 mm[Hg] (ref 32–45)

## 2023-06-28 LAB — ECHO INTRAOPERATIVE TEE
Height: 73 in
Weight: 3650.82 [oz_av]

## 2023-06-28 LAB — GLUCOSE, CAPILLARY
Glucose-Capillary: 101 mg/dL — ABNORMAL HIGH (ref 70–99)
Glucose-Capillary: 103 mg/dL — ABNORMAL HIGH (ref 70–99)
Glucose-Capillary: 106 mg/dL — ABNORMAL HIGH (ref 70–99)
Glucose-Capillary: 110 mg/dL — ABNORMAL HIGH (ref 70–99)
Glucose-Capillary: 110 mg/dL — ABNORMAL HIGH (ref 70–99)
Glucose-Capillary: 123 mg/dL — ABNORMAL HIGH (ref 70–99)
Glucose-Capillary: 128 mg/dL — ABNORMAL HIGH (ref 70–99)
Glucose-Capillary: 130 mg/dL — ABNORMAL HIGH (ref 70–99)
Glucose-Capillary: 135 mg/dL — ABNORMAL HIGH (ref 70–99)
Glucose-Capillary: 148 mg/dL — ABNORMAL HIGH (ref 70–99)
Glucose-Capillary: 151 mg/dL — ABNORMAL HIGH (ref 70–99)
Glucose-Capillary: 87 mg/dL (ref 70–99)
Glucose-Capillary: 99 mg/dL (ref 70–99)

## 2023-06-28 LAB — HEMOGLOBIN AND HEMATOCRIT, BLOOD
HCT: 24.2 % — ABNORMAL LOW (ref 39.0–52.0)
Hemoglobin: 8 g/dL — ABNORMAL LOW (ref 13.0–17.0)

## 2023-06-28 LAB — POTASSIUM: Potassium: 6.5 mmol/L (ref 3.5–5.1)

## 2023-06-28 LAB — MAGNESIUM: Magnesium: 3.8 mg/dL — ABNORMAL HIGH (ref 1.7–2.4)

## 2023-06-28 LAB — APTT: aPTT: 34 s (ref 24–36)

## 2023-06-28 LAB — PREPARE RBC (CROSSMATCH)

## 2023-06-28 LAB — PROTIME-INR
INR: 1.4 — ABNORMAL HIGH (ref 0.8–1.2)
Prothrombin Time: 17.8 s — ABNORMAL HIGH (ref 11.4–15.2)

## 2023-06-28 LAB — HEPARIN LEVEL (UNFRACTIONATED): Heparin Unfractionated: 0.3 [IU]/mL (ref 0.30–0.70)

## 2023-06-28 LAB — PLATELET COUNT: Platelets: 91 10*3/uL — ABNORMAL LOW (ref 150–400)

## 2023-06-28 SURGERY — CORONARY ARTERY BYPASS GRAFTING (CABG)
Anesthesia: General | Site: Chest

## 2023-06-28 MED ORDER — CHLORHEXIDINE GLUCONATE 0.12 % MT SOLN
15.0000 mL | OROMUCOSAL | Status: AC
  Administered 2023-06-28: 15 mL via OROMUCOSAL

## 2023-06-28 MED ORDER — SODIUM CHLORIDE 0.45 % IV SOLN: INTRAVENOUS | Status: AC | PRN

## 2023-06-28 MED ORDER — NITROGLYCERIN 0.2 MG/ML ON CALL CATH LAB
INTRAVENOUS | Status: DC | PRN
  Administered 2023-06-28 (×3): 10 ug via INTRAVENOUS

## 2023-06-28 MED ORDER — OXYCODONE HCL 5 MG PO TABS
5.0000 mg | ORAL_TABLET | ORAL | Status: DC | PRN
  Administered 2023-06-28 – 2023-06-30 (×7): 10 mg via ORAL
  Administered 2023-07-03 – 2023-07-06 (×7): 5 mg via ORAL
  Administered 2023-07-06 – 2023-07-08 (×11): 10 mg via ORAL
  Filled 2023-06-28: qty 1
  Filled 2023-06-28: qty 2
  Filled 2023-06-28: qty 1
  Filled 2023-06-28 (×5): qty 2
  Filled 2023-06-28: qty 1
  Filled 2023-06-28 (×2): qty 2
  Filled 2023-06-28: qty 1
  Filled 2023-06-28 (×2): qty 2
  Filled 2023-06-28: qty 1
  Filled 2023-06-28 (×4): qty 2
  Filled 2023-06-28: qty 1
  Filled 2023-06-28 (×5): qty 2
  Filled 2023-06-28: qty 1
  Filled 2023-06-28: qty 2

## 2023-06-28 MED ORDER — NOREPINEPHRINE BITARTRATE 1 MG/ML IV SOLN
INTRAVENOUS | Status: DC | PRN
  Administered 2023-06-28: .5 mL via INTRAVENOUS
  Administered 2023-06-28: 1 mL via INTRAVENOUS
  Administered 2023-06-28: .5 mL via INTRAVENOUS
  Administered 2023-06-28: 1 mL via INTRAVENOUS
  Administered 2023-06-28: .5 mL via INTRAVENOUS

## 2023-06-28 MED ORDER — MIDAZOLAM HCL 2 MG/2ML IJ SOLN: 2.0000 mg | INTRAMUSCULAR | Status: DC | PRN

## 2023-06-28 MED ORDER — MIDAZOLAM HCL 2 MG/2ML IJ SOLN
INTRAMUSCULAR | Status: AC
  Filled 2023-06-28: qty 2

## 2023-06-28 MED ORDER — EPHEDRINE SULFATE-NACL 50-0.9 MG/10ML-% IV SOSY
PREFILLED_SYRINGE | INTRAVENOUS | Status: DC | PRN
  Administered 2023-06-28: 5 mg via INTRAVENOUS

## 2023-06-28 MED ORDER — STERILE WATER FOR INJECTION IJ SOLN
INTRAMUSCULAR | Status: DC | PRN
  Administered 2023-06-28: 2000 mL

## 2023-06-28 MED ORDER — FENTANYL CITRATE (PF) 250 MCG/5ML IJ SOLN
INTRAMUSCULAR | Status: AC
  Filled 2023-06-28: qty 5

## 2023-06-28 MED ORDER — MORPHINE SULFATE (PF) 2 MG/ML IV SOLN
1.0000 mg | INTRAVENOUS | Status: DC | PRN
  Administered 2023-06-28 (×5): 2 mg via INTRAVENOUS
  Administered 2023-06-29 (×6): 4 mg via INTRAVENOUS
  Filled 2023-06-28: qty 2
  Filled 2023-06-28: qty 1
  Filled 2023-06-28: qty 2
  Filled 2023-06-28 (×2): qty 1
  Filled 2023-06-28 (×2): qty 2
  Filled 2023-06-28 (×2): qty 1
  Filled 2023-06-28 (×2): qty 2

## 2023-06-28 MED ORDER — PROPOFOL 10 MG/ML IV BOLUS
INTRAVENOUS | Status: DC | PRN
  Administered 2023-06-28: 80 mg via INTRAVENOUS

## 2023-06-28 MED ORDER — DEXMEDETOMIDINE HCL IN NACL 400 MCG/100ML IV SOLN
0.0000 ug/kg/h | INTRAVENOUS | Status: DC
  Administered 2023-06-28: 0.6 ug/kg/h via INTRAVENOUS
  Filled 2023-06-28: qty 100

## 2023-06-28 MED ORDER — PLASMA-LYTE A IV SOLN
INTRAVENOUS | Status: DC | PRN
  Administered 2023-06-28: 1000 mL

## 2023-06-28 MED ORDER — LACTATED RINGERS IV SOLN: INTRAVENOUS | Status: AC

## 2023-06-28 MED ORDER — ROCURONIUM BROMIDE 10 MG/ML (PF) SYRINGE
PREFILLED_SYRINGE | INTRAVENOUS | Status: AC
Start: 2023-06-28 — End: ?
  Filled 2023-06-28: qty 30

## 2023-06-28 MED ORDER — PANTOPRAZOLE SODIUM 40 MG IV SOLR
40.0000 mg | Freq: Every day | INTRAVENOUS | Status: AC
  Administered 2023-06-28 – 2023-06-29 (×2): 40 mg via INTRAVENOUS
  Filled 2023-06-28 (×2): qty 10

## 2023-06-28 MED ORDER — VANCOMYCIN HCL IN DEXTROSE 1-5 GM/200ML-% IV SOLN
1000.0000 mg | Freq: Once | INTRAVENOUS | Status: AC
  Administered 2023-06-28: 1000 mg via INTRAVENOUS
  Filled 2023-06-28: qty 200

## 2023-06-28 MED ORDER — INSULIN REGULAR(HUMAN) IN NACL 100-0.9 UT/100ML-% IV SOLN: INTRAVENOUS | Status: DC

## 2023-06-28 MED ORDER — SODIUM CHLORIDE 0.9 % IV SOLN: 250.0000 mL | INTRAVENOUS | Status: AC

## 2023-06-28 MED ORDER — ACETAMINOPHEN 160 MG/5ML PO SOLN
650.0000 mg | Freq: Once | ORAL | Status: AC
  Administered 2023-06-28: 650 mg
  Filled 2023-06-28: qty 20.3

## 2023-06-28 MED ORDER — CEFAZOLIN SODIUM-DEXTROSE 2-4 GM/100ML-% IV SOLN
2.0000 g | Freq: Three times a day (TID) | INTRAVENOUS | Status: DC
  Filled 2023-06-28: qty 100

## 2023-06-28 MED ORDER — METOPROLOL TARTRATE 5 MG/5ML IV SOLN: 2.5000 mg | INTRAVENOUS | Status: DC | PRN

## 2023-06-28 MED ORDER — PHENYLEPHRINE 80 MCG/ML (10ML) SYRINGE FOR IV PUSH (FOR BLOOD PRESSURE SUPPORT)
PREFILLED_SYRINGE | INTRAVENOUS | Status: DC | PRN
  Administered 2023-06-28: 80 ug via INTRAVENOUS

## 2023-06-28 MED ORDER — SODIUM CHLORIDE (PF) 0.9 % IJ SOLN
OROMUCOSAL | Status: DC | PRN
  Administered 2023-06-28 (×2): 4 mL via TOPICAL

## 2023-06-28 MED ORDER — INSULIN ASPART 100 UNIT/ML IV SOLN
5.0000 [IU] | Freq: Once | INTRAVENOUS | Status: AC
  Administered 2023-06-28: 5 [IU] via INTRAVENOUS

## 2023-06-28 MED ORDER — ONDANSETRON HCL 4 MG/2ML IJ SOLN
INTRAMUSCULAR | Status: DC | PRN
  Administered 2023-06-28: 8 mg via INTRAVENOUS

## 2023-06-28 MED ORDER — METOCLOPRAMIDE HCL 5 MG/ML IJ SOLN
10.0000 mg | Freq: Four times a day (QID) | INTRAMUSCULAR | Status: AC
  Administered 2023-06-28 – 2023-06-29 (×4): 10 mg via INTRAVENOUS
  Filled 2023-06-28 (×4): qty 2

## 2023-06-28 MED ORDER — METOPROLOL TARTRATE 12.5 MG HALF TABLET
12.5000 mg | ORAL_TABLET | Freq: Two times a day (BID) | ORAL | Status: DC
  Administered 2023-06-29 – 2023-07-01 (×4): 12.5 mg via ORAL
  Filled 2023-06-28 (×5): qty 1

## 2023-06-28 MED ORDER — PANTOPRAZOLE SODIUM 40 MG PO TBEC
40.0000 mg | DELAYED_RELEASE_TABLET | Freq: Every day | ORAL | Status: DC
  Administered 2023-06-30 – 2023-07-08 (×9): 40 mg via ORAL
  Filled 2023-06-28 (×9): qty 1

## 2023-06-28 MED ORDER — POTASSIUM CHLORIDE 10 MEQ/50ML IV SOLN: 10.0000 meq | INTRAVENOUS | Status: AC

## 2023-06-28 MED ORDER — BISACODYL 10 MG RE SUPP: 10.0000 mg | Freq: Every day | RECTAL | Status: DC

## 2023-06-28 MED ORDER — SODIUM CHLORIDE 0.9 % IV SOLN
20.0000 ug | INTRAVENOUS | Status: AC
  Administered 2023-06-28: 20 ug via INTRAVENOUS
  Filled 2023-06-28: qty 5

## 2023-06-28 MED ORDER — ASPIRIN 81 MG PO CHEW
324.0000 mg | CHEWABLE_TABLET | Freq: Every day | ORAL | Status: DC
  Filled 2023-06-28 (×2): qty 4

## 2023-06-28 MED ORDER — BISACODYL 5 MG PO TBEC
10.0000 mg | DELAYED_RELEASE_TABLET | Freq: Every day | ORAL | Status: DC
  Administered 2023-06-29 – 2023-07-07 (×7): 10 mg via ORAL
  Filled 2023-06-28 (×10): qty 2

## 2023-06-28 MED ORDER — HEPARIN SODIUM (PORCINE) 1000 UNIT/ML IJ SOLN
INTRAMUSCULAR | Status: AC
  Filled 2023-06-28: qty 10

## 2023-06-28 MED ORDER — HEPARIN SODIUM (PORCINE) 1000 UNIT/ML IJ SOLN
INTRAMUSCULAR | Status: DC | PRN
  Administered 2023-06-28: 35000 [IU] via INTRAVENOUS
  Administered 2023-06-28: 5000 [IU] via INTRAVENOUS

## 2023-06-28 MED ORDER — PROTAMINE SULFATE 10 MG/ML IV SOLN
INTRAVENOUS | Status: AC
  Filled 2023-06-28: qty 25

## 2023-06-28 MED ORDER — FENTANYL CITRATE (PF) 250 MCG/5ML IJ SOLN
INTRAMUSCULAR | Status: DC | PRN
  Administered 2023-06-28: 50 ug via INTRAVENOUS
  Administered 2023-06-28: 150 ug via INTRAVENOUS
  Administered 2023-06-28: 300 ug via INTRAVENOUS
  Administered 2023-06-28: 50 ug via INTRAVENOUS
  Administered 2023-06-28 (×2): 100 ug via INTRAVENOUS

## 2023-06-28 MED ORDER — ACETAMINOPHEN 160 MG/5ML PO SOLN
1000.0000 mg | Freq: Four times a day (QID) | ORAL | Status: AC
Start: 2023-06-29 — End: 2023-07-04

## 2023-06-28 MED ORDER — SODIUM CHLORIDE 0.9 % IV SOLN: INTRAVENOUS | Status: AC

## 2023-06-28 MED ORDER — METOPROLOL TARTRATE 25 MG/10 ML ORAL SUSPENSION
12.5000 mg | Freq: Two times a day (BID) | ORAL | Status: DC
  Filled 2023-06-28 (×4): qty 5

## 2023-06-28 MED ORDER — DOBUTAMINE-DEXTROSE 4-5 MG/ML-% IV SOLN
INTRAVENOUS | Status: DC | PRN
  Administered 2023-06-28: 4 ug/kg/min via INTRAVENOUS

## 2023-06-28 MED ORDER — DOBUTAMINE-DEXTROSE 4-5 MG/ML-% IV SOLN: 0.0000 ug/kg/min | INTRAVENOUS | Status: DC

## 2023-06-28 MED ORDER — ASPIRIN 325 MG PO TBEC
325.0000 mg | DELAYED_RELEASE_TABLET | Freq: Every day | ORAL | Status: DC
  Administered 2023-06-29 – 2023-07-04 (×6): 325 mg via ORAL
  Filled 2023-06-28 (×6): qty 1

## 2023-06-28 MED ORDER — CALCIUM GLUCONATE 10 % IV SOLN
1.0000 g | Freq: Once | INTRAVENOUS | Status: AC
  Administered 2023-06-28: 1 g via INTRAVENOUS
  Filled 2023-06-28: qty 10

## 2023-06-28 MED ORDER — SODIUM ZIRCONIUM CYCLOSILICATE 10 G PO PACK
20.0000 g | PACK | Freq: Once | ORAL | Status: AC
  Administered 2023-06-28: 20 g via ORAL
  Filled 2023-06-28: qty 2

## 2023-06-28 MED ORDER — TRAMADOL HCL 50 MG PO TABS
50.0000 mg | ORAL_TABLET | ORAL | Status: DC | PRN
  Administered 2023-06-29 – 2023-06-30 (×4): 100 mg via ORAL
  Administered 2023-07-03 – 2023-07-06 (×2): 50 mg via ORAL
  Filled 2023-06-28 (×3): qty 2
  Filled 2023-06-28 (×2): qty 1
  Filled 2023-06-28: qty 2

## 2023-06-28 MED ORDER — SODIUM BICARBONATE 8.4 % IV SOLN
50.0000 meq | Freq: Once | INTRAVENOUS | Status: AC
  Administered 2023-06-28: 50 meq via INTRAVENOUS

## 2023-06-28 MED ORDER — ALBUMIN HUMAN 5 % IV SOLN
250.0000 mL | INTRAVENOUS | Status: AC | PRN
  Administered 2023-06-28 – 2023-06-29 (×5): 12.5 g via INTRAVENOUS
  Filled 2023-06-28 (×5): qty 250

## 2023-06-28 MED ORDER — MIDAZOLAM HCL 2 MG/2ML IJ SOLN
INTRAMUSCULAR | Status: DC | PRN
  Administered 2023-06-28 (×2): 1 mg via INTRAVENOUS

## 2023-06-28 MED ORDER — ROCURONIUM BROMIDE 10 MG/ML (PF) SYRINGE
PREFILLED_SYRINGE | INTRAVENOUS | Status: DC | PRN
  Administered 2023-06-28: 100 mg via INTRAVENOUS
  Administered 2023-06-28 (×2): 50 mg via INTRAVENOUS
  Administered 2023-06-28: 100 mg via INTRAVENOUS

## 2023-06-28 MED ORDER — CEFAZOLIN SODIUM-DEXTROSE 1-4 GM/50ML-% IV SOLN
1.0000 g | INTRAVENOUS | Status: AC
  Administered 2023-06-28 – 2023-06-29 (×2): 1 g via INTRAVENOUS
  Filled 2023-06-28 (×2): qty 50

## 2023-06-28 MED ORDER — NOREPINEPHRINE 4 MG/250ML-% IV SOLN: 0.0000 ug/min | INTRAVENOUS | Status: DC

## 2023-06-28 MED ORDER — PROTAMINE SULFATE 10 MG/ML IV SOLN
INTRAVENOUS | Status: DC | PRN
  Administered 2023-06-28: 350 mg via INTRAVENOUS

## 2023-06-28 MED ORDER — 0.9 % SODIUM CHLORIDE (POUR BTL) OPTIME
TOPICAL | Status: DC | PRN
  Administered 2023-06-28: 5000 mL

## 2023-06-28 MED ORDER — MAGNESIUM SULFATE 4 GM/100ML IV SOLN
4.0000 g | Freq: Once | INTRAVENOUS | Status: AC
  Administered 2023-06-28: 4 g via INTRAVENOUS
  Filled 2023-06-28: qty 100

## 2023-06-28 MED ORDER — VASOPRESSIN 20 UNIT/ML IV SOLN
INTRAVENOUS | Status: DC | PRN
  Administered 2023-06-28: 1 [IU] via INTRAVENOUS

## 2023-06-28 MED ORDER — DOCUSATE SODIUM 100 MG PO CAPS
200.0000 mg | ORAL_CAPSULE | Freq: Every day | ORAL | Status: DC
  Administered 2023-06-29 – 2023-07-08 (×9): 200 mg via ORAL
  Filled 2023-06-28 (×10): qty 2

## 2023-06-28 MED ORDER — CHLORHEXIDINE GLUCONATE CLOTH 2 % EX PADS
6.0000 | MEDICATED_PAD | Freq: Every day | CUTANEOUS | Status: DC
  Administered 2023-06-28: 6 via TOPICAL

## 2023-06-28 MED ORDER — DEXTROSE 50 % IV SOLN: 0.0000 mL | INTRAVENOUS | Status: DC | PRN

## 2023-06-28 MED ORDER — HEPARIN SODIUM (PORCINE) 1000 UNIT/ML IJ SOLN
INTRAMUSCULAR | Status: AC
  Filled 2023-06-28: qty 1

## 2023-06-28 MED ORDER — SODIUM CHLORIDE 0.9 % IV SOLN: INTRAVENOUS | Status: DC | PRN

## 2023-06-28 MED ORDER — PROTAMINE SULFATE 10 MG/ML IV SOLN
INTRAVENOUS | Status: AC
Start: 2023-06-28 — End: ?
  Filled 2023-06-28: qty 10

## 2023-06-28 MED ORDER — PROPOFOL 10 MG/ML IV BOLUS
INTRAVENOUS | Status: AC
  Filled 2023-06-28: qty 20

## 2023-06-28 MED ORDER — SODIUM CHLORIDE 0.9% FLUSH
3.0000 mL | Freq: Two times a day (BID) | INTRAVENOUS | Status: DC
  Administered 2023-06-29 – 2023-07-07 (×13): 3 mL via INTRAVENOUS

## 2023-06-28 MED ORDER — ONDANSETRON HCL 4 MG/2ML IJ SOLN
4.0000 mg | Freq: Four times a day (QID) | INTRAMUSCULAR | Status: DC | PRN
  Administered 2023-06-30 – 2023-07-07 (×4): 4 mg via INTRAVENOUS
  Filled 2023-06-28 (×5): qty 2

## 2023-06-28 MED ORDER — ACETAMINOPHEN 500 MG PO TABS
1000.0000 mg | ORAL_TABLET | Freq: Four times a day (QID) | ORAL | Status: AC
Start: 2023-06-29 — End: 2023-07-04
  Administered 2023-06-28 – 2023-07-03 (×19): 1000 mg via ORAL
  Filled 2023-06-28 (×20): qty 2

## 2023-06-28 MED ORDER — ASPIRIN 81 MG PO CHEW
324.0000 mg | CHEWABLE_TABLET | Freq: Once | ORAL | Status: AC
  Administered 2023-06-28: 324 mg via ORAL

## 2023-06-28 MED ORDER — LACTATED RINGERS IV SOLN: INTRAVENOUS | Status: DC | PRN

## 2023-06-28 MED ORDER — NICARDIPINE HCL IN NACL 20-0.86 MG/200ML-% IV SOLN
0.0000 mg/h | INTRAVENOUS | Status: DC
  Administered 2023-06-28: 5 mg/h via INTRAVENOUS
  Filled 2023-06-28: qty 200

## 2023-06-28 MED ORDER — CHLORHEXIDINE GLUCONATE CLOTH 2 % EX PADS
6.0000 | MEDICATED_PAD | Freq: Every day | CUTANEOUS | Status: DC
  Administered 2023-06-29 – 2023-07-05 (×5): 6 via TOPICAL

## 2023-06-28 MED ORDER — SODIUM CHLORIDE 0.9% FLUSH: 3.0000 mL | INTRAVENOUS | Status: DC | PRN

## 2023-06-28 SURGICAL SUPPLY — 83 items
BAG DECANTER FOR FLEXI CONT (MISCELLANEOUS) ×2 IMPLANT
BLADE CLIPPER SURG (BLADE) ×2 IMPLANT
BLADE STERNUM SYSTEM 6 (BLADE) ×2 IMPLANT
BLOOD HAEMOCONCENTR 700 MIDI (MISCELLANEOUS) IMPLANT
BNDG ELASTIC 4INX 5YD STR LF (GAUZE/BANDAGES/DRESSINGS) IMPLANT
BNDG ELASTIC 4X5.8 VLCR STR LF (GAUZE/BANDAGES/DRESSINGS) ×2 IMPLANT
BNDG ELASTIC 6INX 5YD STR LF (GAUZE/BANDAGES/DRESSINGS) IMPLANT
BNDG ELASTIC 6X5.8 VLCR STR LF (GAUZE/BANDAGES/DRESSINGS) ×2 IMPLANT
BNDG GAUZE DERMACEA FLUFF 4 (GAUZE/BANDAGES/DRESSINGS) ×2 IMPLANT
CABLE SURGICAL S-101-97-12 (CABLE) ×2 IMPLANT
CANISTER SUCT 3000ML PPV (MISCELLANEOUS) ×2 IMPLANT
CANNULA MC2 2 STG 29/37 NON-V (CANNULA) ×2 IMPLANT
CANNULA NON VENT 20FR 12 (CANNULA) ×2 IMPLANT
CATH ROBINSON RED A/P 18FR (CATHETERS) ×4 IMPLANT
CIRCUIT VACPAC SAFETY (MISCELLANEOUS) IMPLANT
CLIP RETRACTION 3.0MM CORONARY (MISCELLANEOUS) IMPLANT
CLIP TI MEDIUM 24 (CLIP) IMPLANT
CLIP TI WIDE RED SMALL 24 (CLIP) IMPLANT
CONN ST 1/2X1/2 BEN (MISCELLANEOUS) ×2 IMPLANT
CONNECTOR BLAKE 2:1 CARIO BLK (MISCELLANEOUS) ×2 IMPLANT
CONTAINER PROTECT SURGISLUSH (MISCELLANEOUS) ×4 IMPLANT
DERMABOND ADVANCED .7 DNX12 (GAUZE/BANDAGES/DRESSINGS) IMPLANT
DRAIN CHANNEL 19F RND (DRAIN) ×6 IMPLANT
DRAIN CONNECTOR BLAKE 1:1 (MISCELLANEOUS) ×2 IMPLANT
DRAPE INCISE IOBAN 66X45 STRL (DRAPES) IMPLANT
DRAPE SRG 135X102X78XABS (DRAPES) ×2 IMPLANT
DRAPE WARM FLUID 44X44 (DRAPES) ×2 IMPLANT
DRSG AQUACEL AG ADV 3.5X10 (GAUZE/BANDAGES/DRESSINGS) ×2 IMPLANT
ELECT BLADE 4.0 EZ CLEAN MEGAD (MISCELLANEOUS) ×2
ELECT REM PT RETURN 9FT ADLT (ELECTROSURGICAL) ×4
ELECTRODE BLDE 4.0 EZ CLN MEGD (MISCELLANEOUS) ×2 IMPLANT
ELECTRODE REM PT RTRN 9FT ADLT (ELECTROSURGICAL) ×4 IMPLANT
FELT TEFLON 1X6 (MISCELLANEOUS) ×4 IMPLANT
GAUZE 4X4 16PLY ~~LOC~~+RFID DBL (SPONGE) ×2 IMPLANT
GAUZE SPONGE 4X4 12PLY STRL (GAUZE/BANDAGES/DRESSINGS) ×4 IMPLANT
GLOVE BIO SURGEON STRL SZ7 (GLOVE) ×4 IMPLANT
GLOVE BIOGEL M STRL SZ7.5 (GLOVE) ×4 IMPLANT
GOWN STRL REUS W/ TWL LRG LVL3 (GOWN DISPOSABLE) ×8 IMPLANT
GOWN STRL REUS W/ TWL XL LVL3 (GOWN DISPOSABLE) ×4 IMPLANT
HEMOSTAT POWDER SURGIFOAM 1G (HEMOSTASIS) ×4 IMPLANT
INSERT SUTURE HOLDER (MISCELLANEOUS) ×2 IMPLANT
KIT BASIN OR (CUSTOM PROCEDURE TRAY) ×2 IMPLANT
KIT SUCTION CATH 14FR (SUCTIONS) ×2 IMPLANT
KIT TURNOVER KIT B (KITS) ×2 IMPLANT
KIT VASOVIEW HEMOPRO 2 VH 4000 (KITS) ×2 IMPLANT
LEAD PACING MYOCARDI (MISCELLANEOUS) ×2 IMPLANT
LINE EXTENSION DELIVERY (MISCELLANEOUS) IMPLANT
LINE VENT (MISCELLANEOUS) IMPLANT
MARKER GRAFT CORONARY BYPASS (MISCELLANEOUS) ×6 IMPLANT
NDL SUT 4 .5 CRC FRENCH EYE (NEEDLE) IMPLANT
NS IRRIG 1000ML POUR BTL (IV SOLUTION) ×10 IMPLANT
PACK E OPEN HEART (SUTURE) ×2 IMPLANT
PACK OPEN HEART (CUSTOM PROCEDURE TRAY) ×2 IMPLANT
PAD ARMBOARD 7.5X6 YLW CONV (MISCELLANEOUS) ×4 IMPLANT
PAD ELECT DEFIB RADIOL ZOLL (MISCELLANEOUS) ×2 IMPLANT
PENCIL BUTTON HOLSTER BLD 10FT (ELECTRODE) ×2 IMPLANT
POSITIONER HEAD DONUT 9IN (MISCELLANEOUS) ×2 IMPLANT
PUNCH AORTIC ROTATE 4.0MM (MISCELLANEOUS) ×2 IMPLANT
SET MPS 3-ND DEL (MISCELLANEOUS) IMPLANT
SPONGE T-LAP 18X18 ~~LOC~~+RFID (SPONGE) ×8 IMPLANT
STOPCOCK 4 WAY LG BORE MALE ST (IV SETS) IMPLANT
SUPPORT HEART JANKE-BARRON (MISCELLANEOUS) ×2 IMPLANT
SUT BONE WAX W31G (SUTURE) ×2 IMPLANT
SUT ETHIBOND X763 2 0 SH 1 (SUTURE) ×4 IMPLANT
SUT MNCRL AB 3-0 PS2 18 (SUTURE) ×4 IMPLANT
SUT PDS AB 1 CTX 36 (SUTURE) ×4 IMPLANT
SUT PROLENE 4 0 SH DA (SUTURE) ×2 IMPLANT
SUT PROLENE 4-0 RB1 .5 CRCL 36 (SUTURE) IMPLANT
SUT PROLENE 5 0 C 1 36 (SUTURE) ×6 IMPLANT
SUT PROLENE 7 0 BV1 MDA (SUTURE) ×2 IMPLANT
SUT STEEL 6MS V (SUTURE) ×4 IMPLANT
SUT VIC AB 2-0 CT1 TAPERPNT 27 (SUTURE) IMPLANT
SUT VICRYL 3-0 27 CRC X-1 (SUTURE) IMPLANT
SYSTEM SAHARA CHEST DRAIN ATS (WOUND CARE) ×2 IMPLANT
TAPE CLOTH SOFT 2X10 (GAUZE/BANDAGES/DRESSINGS) IMPLANT
TAPE PAPER 2X10 WHT MICROPORE (GAUZE/BANDAGES/DRESSINGS) IMPLANT
TOWEL GREEN STERILE (TOWEL DISPOSABLE) ×2 IMPLANT
TOWEL GREEN STERILE FF (TOWEL DISPOSABLE) ×2 IMPLANT
TRAY FOLEY SLVR 16FR TEMP STAT (SET/KITS/TRAYS/PACK) ×2 IMPLANT
TUBE CONNECTING 20X1/4 (TUBING) IMPLANT
TUBING LAP HI FLOW INSUFFLATIO (TUBING) ×2 IMPLANT
UNDERPAD 30X36 HEAVY ABSORB (UNDERPADS AND DIAPERS) ×2 IMPLANT
WATER STERILE IRR 1000ML POUR (IV SOLUTION) ×4 IMPLANT

## 2023-06-28 NOTE — Anesthesia Procedure Notes (Signed)
 Arterial Line Insertion Start/End1/07/2023 7:00 AM, 06/28/2023 7:05 AM Performed by: Zelphia Norleen HERO, CRNA, CRNA  Patient location: Pre-op. Preanesthetic checklist: patient identified, IV checked, site marked, risks and benefits discussed, surgical consent, monitors and equipment checked, pre-op evaluation, timeout performed and anesthesia consent Lidocaine  1% used for infiltration Left, radial was placed Catheter size: 20 G Hand hygiene performed  and maximum sterile barriers used   Attempts: 2 Procedure performed using ultrasound guided technique. Following insertion, dressing applied and Biopatch. Post procedure assessment: normal and unchanged  Post procedure complications: unsuccessful attempts. Patient tolerated the procedure well with no immediate complications.

## 2023-06-28 NOTE — Progress Notes (Signed)
 Brief nephrology note  Patient with hyperkalemia after surgery. No treatment thus far. I recommend obtaining an EKG  Will give insulin  bolus (on drip) and bicarb. Also 20g of lokelma . Ctm k closely overnight. Dialysis first thing in the morning or sooner if situation becomes more urgent. Redose lokelma  and use shifting agents again if needed.  Any assistance from icu team is also appreciated.

## 2023-06-28 NOTE — Progress Notes (Signed)
     [address]       [place] [address]             [phone]       No events Vitals:   06/28/23 0012 06/28/23 0353  BP: (!) 143/84 125/74  Pulse: 90 89  Resp: 18 17  Temp: 98.4 F (36.9 C) 98.7 F (37.1 C)  SpO2: 100% 100%   Alert NAD Sinus  EWOB  OR today for CABG

## 2023-06-28 NOTE — Consult Note (Signed)
 NAME:  Corey Park, MRN:  [id number], DOB:  14-Mar-1971, LOS: 3 ADMISSION DATE:  06/25/2023 CONSULTATION DATE:  06/28/2023 REFERRING MD:  Shyrl - TCTS CHIEF COMPLAINT:  Postoperative vent management   History of Present Illness:  [name] is seen in consultation at the request of Dr. Shyrl (TCTS) for management of mechanical ventilation and hemodynamics post-CABG x 72.  53 year old man who presented to [facility] 12/30 for CP x 3-4 days. PMHx significant for HTN, CAD (3-vessel disease), CHF (Echo 05/2023 EF 40-45%, +RWMAs, G2DD), dilated cardiomyopathy, T2DM, ESRD on HD (MWF via RUE AVF), legal blindness.  Found to have NSTEMI on admission. Underwent LHC 12/30 demonstrating severe 3-vessel CAD and CABG was recommended. TCTS consulted and underwent CABG x 3 (LIMA to LAD, RSVG to distal RCA, OM) with Dr. Shyrl 1/2. Intraoperative course was uncomplicated. EBL , received 1U Plt. X-clamp time , pump time .  PCCM consulted for postoperative ventilator management.  Pertinent Medical History:   Past Medical History:  Diagnosis Date   Allergy, unspecified, initial encounter 08/25/2019   Asthma    as a child   Cellulitis, perineum 11/26/2019   CHF (congestive heart failure) (HCC) 2021   Complication of vascular dialysis catheter 08/25/2019   COVID-19 virus infection 07/27/2019   Diabetes mellitus without complication (HCC)    type 2   Dilated cardiomyopathy (HCC) 07/03/2018   ESRD on hemodialysis (HCC) 06/17/2018   MWF at [facility]   Fe deficiency anemia 12/23/2018   Hypertension    Legally blind    B/L   Pneumonia 2021   Type 2 diabetes mellitus with diabetic peripheral angiopathy without gangrene (HCC) 08/25/2019   Unspecified protein-calorie malnutrition (HCC) 08/25/2019   Significant Hospital Events: Including procedures, antibiotic start and stop dates in addition to other pertinent events   1/2 - CABG x 3. CCM consulted for MV/hemodynamics.  Interim History /  Subjective:  PCCM consulted for vent management post-CABG.  Objective:  Blood pressure 125/74, pulse 89, temperature 98.7 F (37.1 C), temperature source Oral, resp. rate 17, height 6' 1 (1.854 m), weight 103.5 kg, SpO2 100%.        Intake/Output Summary (Last 24 hours) at 06/28/2023 1243 Last data filed at 06/28/2023 1202 Gross per 24 hour  Intake 1438 ml  Output 3100 ml  Net -1662 ml   Filed Weights   06/27/23 0600 06/27/23 1810 06/28/23 0646  Weight: 98.8 kg 103.5 kg 103.5 kg   Physical Examination: General: Acutely ill-appearing middle-aged man in NAD. HEENT: [place]/AT, anicteric sclera, R pupil 2mm and reactive, L pinpoint and nonreactive, moist mucous membranes. Neuro: Intubated, sedated.  Unresponsive.  Does not respond to verbal, tactile or noxious stimuli. Not following commands. No spontaneous movement of extremities noted. Strength not assessed. No cough/gag, though sedation noted. CV: RRR, no m/g/r. Midline sternotomy with dressing in place, clean/dry/intact without strikethrough. CT in place with serosanguinous output. PULM: Breathing even and unlabored on vent (FiO2 50%) . Lung fields CTAB. GI: Soft, nontender, nondistended. Hypoactive bowel sounds. Extremities: No LE edema noted. Skin: Warm/dry, no rashes.  Resolved Hospital Problem List:    Assessment & Plan:  [name] is seen in consultation at the request of Dr. Shyrl (TCTS) for management of mechanical ventilation and hemodynamics post-CABG x 73.  53 year old man who presented to [facility] 12/30 for CP x 3-4 days. PMHx significant for HTN, CAD (3-vessel disease), CHF (Echo 05/2023 EF 40-45%, +RWMAs, G2DD), dilated cardiomyopathy, T2DM, ESRD on HD (MWF via RUE AVF). Now s/p CABG 1/2.  Postoperative vent management - Continue full vent support (4-8cc/kg IBW) - Wean FiO2 for O2 sat > 90% - Daily WUA/SBT, rapid wean with SIMV per protocol - VAP bundle - Pulmonary hygiene - F/u ABG - PAD protocol for sedation:  Precedex  for goal RASS 0 to -1  S/p 3-vessel CABG (LIMA to LAD, RSVG to distal RCA, OM) and RSVG CAD CHF Dilated CM - Monitor CI, CO via FloTrac - Vasopressors per protocol, arrived on NE now weaned off - ASA/statin - Postoperative care per TCTS - CT management per protocol - Multimodal pain management with oxycodone , morphine , tramadol , APAP  ESRD on HD MWF - Nephrology consult for continued HD per usual schedule - Trend BMP - Replete electrolytes as indicated - Monitor I&Os - Avoid nephrotoxic agents as able  T2DM - Insulin  gtt in the immediate postoperative period, then transition to SSI - CBGs/titration via EndoTool - Goal CBG 140-180  Best Practice: (right click and Reselect all SmartList Selections daily)   Diet/type: NPO, ADAT post-extubation DVT prophylaxis: SCDs GI prophylaxis: PPI Lines: Central line and Arterial Line Foley:  Yes, and it is still needed Code Status:  full code Last date of multidisciplinary goals of care discussion [Per Primary Team]  Labs:  CBC: Recent Labs  Lab 06/25/23 0203 06/25/23 0208 06/26/23 0416 06/27/23 0405 06/28/23 0308 06/28/23 0755 06/28/23 1039 06/28/23 1051 06/28/23 1108 06/28/23 1124 06/28/23 1152  WBC 9.6  --  8.0 8.3 6.8  --   --   --   --   --   --   NEUTROABS 6.6  --   --   --   --   --   --   --   --   --   --   HGB 12.8*   < > 10.4* 10.2* 10.3*   < > 8.5* 8.5* 8.0* 7.5* 6.8*  HCT 38.7*   < > 31.1* 30.1* 30.5*   < > 25.0* 25.0* 24.2* 22.0* 20.0*  MCV 97.2  --  95.7 94.7 95.3  --   --   --   --   --   --   PLT 177  --  137* 140* 130*  --   --   --  91*  --   --    < > = values in this interval not displayed.   Basic Metabolic Panel: Recent Labs  Lab 06/25/23 0203 06/25/23 0208 06/25/23 0448 06/26/23 0416 06/27/23 1535 06/28/23 0308 06/28/23 0755 06/28/23 0935 06/28/23 1009 06/28/23 1013 06/28/23 1039 06/28/23 1051 06/28/23 1108 06/28/23 1124 06/28/23 1152  NA 140   < > 140 137 139 135  135 133*   < > 132* 130* 131*  --  137 135  K 4.5   < > 4.8 4.7 4.8 4.3 5.5* 6.3*   < > 6.4* 6.8* 6.6* 6.5* 6.3* 5.8*  CL 97*   < > 98 98 99 95* 96* 99  --   --  96*  --   --  98 100  CO2 24  --  25 25 25 27   --   --   --   --   --   --   --   --   --   GLUCOSE 156*   < > 175* 96 66* 79 87 111*  --   --  106*  --   --  106* 119*  BUN 44*   < > 45* 48* 56* 31* 34* 31*  --   --  33*  --   --  34* 34*  CREATININE 14.45*   < > 14.50* 16.34* 17.84* 12.30* 13.50* 13.10*  --   --  12.00*  --   --  11.40* 10.40*  CALCIUM  9.3  --  8.7* 8.8* 9.5 8.8*  --   --   --   --   --   --   --   --   --   MG  --   --  2.0  --   --   --   --   --   --   --   --   --   --   --   --   PHOS  --   --  7.5*  --  5.9*  --   --   --   --   --   --   --   --   --   --    < > = values in this interval not displayed.   GFR: Estimated Creatinine Clearance: 10.5 mL/min (A) (by C-G formula based on SCr of 10.4 mg/dL (H)). Recent Labs  Lab 06/25/23 0203 06/25/23 0448 06/26/23 0416 06/27/23 0405 06/28/23 0308  PROCALCITON  --  0.40  --   --   --   WBC 9.6  --  8.0 8.3 6.8   Liver Function Tests: Recent Labs  Lab 06/25/23 0203 06/27/23 1535  AST 25  --   ALT 15  --   ALKPHOS 79  --   BILITOT 0.7  --   PROT 8.3*  --   ALBUMIN  3.6 3.0*   No results for input(s): LIPASE, AMYLASE in the last 168 hours. No results for input(s): AMMONIA in the last 168 hours.  ABG:    Component Value Date/Time   PHART 7.414 06/28/2023 1051   PCO2ART 40.5 06/28/2023 1051   PO2ART 326 (H) 06/28/2023 1051   HCO3 25.9 06/28/2023 1051   TCO2 24 06/28/2023 1152   O2SAT 100 06/28/2023 1051    Coagulation Profile: No results for input(s): INR, PROTIME in the last 168 hours.  Cardiac Enzymes: No results for input(s): CKTOTAL, CKMB, CKMBINDEX, TROPONINI in the last 168 hours.  HbA1C: Hgb A1c MFr Bld  Date/Time Value Ref Range Status  06/26/2023 04:16 AM 5.8 (H) 4.8 - 5.6 % Final    Comment:    (NOTE)          Prediabetes: 5.7 - 6.4         Diabetes: >6.4         Glycemic control for adults with diabetes: <7.0   09/12/2022 03:30 AM 5.6 4.8 - 5.6 % Final    Comment:    (NOTE)         Prediabetes: 5.7 - 6.4         Diabetes: >6.4         Glycemic control for adults with diabetes: <7.0    CBG: Recent Labs  Lab 06/26/23 2320 06/27/23 0547 06/27/23 1202 06/27/23 1837 06/28/23 0014  GLUCAP 190* 89 159* 67* 130*   Review of Systems:   Patient is encephalopathic and/or intubated; therefore, history has been obtained from chart review.   Past Medical History:  He,  has a past medical history of Allergy, unspecified, initial encounter (08/25/2019), Asthma, Cellulitis, perineum (11/26/2019), CHF (congestive heart failure) (HCC) (2021), Complication of vascular dialysis catheter (08/25/2019), COVID-19 virus infection (07/27/2019), Diabetes mellitus without complication (HCC), Dilated cardiomyopathy (HCC) (07/03/2018), ESRD on hemodialysis (HCC) (06/17/2018),  Fe deficiency anemia (12/23/2018), Hypertension, Legally blind, Pneumonia (2021), Type 2 diabetes mellitus with diabetic peripheral angiopathy without gangrene (HCC) (08/25/2019), and Unspecified protein-calorie malnutrition (HCC) (08/25/2019).   Surgical History:   Past Surgical History:  Procedure Laterality Date   BASCILIC VEIN TRANSPOSITION Right 10/16/2019   Procedure: Basilic Vein Transposition Right Arm;  Surgeon: Serene Gaile ORN, MD;  Location: [facility] OR;  Service: Vascular;  Laterality: Right;   BASCILIC VEIN TRANSPOSITION Right 01/29/2020   Procedure: RIGHT ARM SECOND STAGE BASCILIC VEIN TRANSPOSITION;  Surgeon: Serene Gaile ORN, MD;  Location: [facility] OR;  Service: Vascular;  Laterality: Right;   BIOPSY  12/22/2018   Procedure: BIOPSY;  Surgeon: Celestia Agent, MD;  Location: [facility] ENDOSCOPY;  Service: Endoscopy;;   CORONARY ULTRASOUND/IVUS N/A 12/13/2022   Procedure: Coronary Ultrasound/IVUS;  Surgeon: Dann Candyce RAMAN, MD;  Location: [facility]  INVASIVE CV LAB;  Service: Cardiovascular;  Laterality: N/A;   ESOPHAGOGASTRODUODENOSCOPY N/A 12/22/2018   Procedure: ESOPHAGOGASTRODUODENOSCOPY (EGD);  Surgeon: Celestia Agent, MD;  Location: [facility] ENDOSCOPY;  Service: Endoscopy;  Laterality: N/A;   IR FLUORO GUIDE CV LINE RIGHT  07/29/2019   IR US  GUIDE VASC ACCESS RIGHT  07/29/2019   LEFT HEART CATH AND CORONARY ANGIOGRAPHY N/A 12/13/2022   Procedure: LEFT HEART CATH AND CORONARY ANGIOGRAPHY;  Surgeon: Dann Candyce RAMAN, MD;  Location: [facility] INVASIVE CV LAB;  Service: Cardiovascular;  Laterality: N/A;   LEFT HEART CATH AND CORONARY ANGIOGRAPHY N/A 06/25/2023   Procedure: LEFT HEART CATH AND CORONARY ANGIOGRAPHY;  Surgeon: Verlin Lonni BIRCH, MD;  Location: [facility] INVASIVE CV LAB;  Service: Cardiovascular;  Laterality: N/A;   TRANSESOPHAGEAL ECHOCARDIOGRAM (CATH LAB) N/A 05/10/2023   Procedure: TRANSESOPHAGEAL ECHOCARDIOGRAM;  Surgeon: Barbaraann Darryle Ned, MD;  Location: [facility] INVASIVE CV LAB;  Service: Cardiovascular;  Laterality: N/A;   Social History:   reports that he has never smoked. He has never used smokeless tobacco. He reports that he does not currently use alcohol. He reports that he does not currently use drugs after having used the following drugs: Marijuana.   Family History:  His family history includes CAD in his mother; Hypertension in his mother. There is no history of Diabetes, Stroke, Cancer, Kidney failure, Stomach cancer, Colon cancer, or Rectal cancer.   Allergies: Allergies  Allergen Reactions   Imdur  [Isosorbide  Nitrate] Other (See Comments)    Endorsed headache in the past - willing to retry    Home Medications: Prior to Admission medications   Medication Sig Start Date End Date Taking? Authorizing Provider  alum & mag hydroxide-simeth (MAALOX/MYLANTA) 200-200-20 MG/5ML suspension Take 30 mLs by mouth every 6 (six) hours as needed for indigestion or heartburn.   Yes [provider]  amLODipine  (NORVASC ) 5 MG  tablet Take 1 tablet (5 mg total) by mouth daily. 05/12/23 10/09/23 Yes Cindy Garnette POUR, MD  calcium  acetate (PHOSLO ) 667 MG capsule Take 2 capsules (1,334 mg total) by mouth with breakfast, with lunch, and with evening meal. Patient taking differently: Take 2,001 mg by mouth with breakfast, with lunch, and with evening meal. 12/14/22  Yes Tobie Gaines, DO  carvedilol  (COREG ) 25 MG tablet Take 1 tablet (25 mg total) by mouth 2 (two) times daily with a meal. 12/14/22  Yes Tobie Gaines, DO  famotidine  (PEPCID ) 20 MG tablet Take 20 mg by mouth daily. 04/20/23  Yes [provider]  ferric citrate  (AURYXIA ) 1 GM 210 MG(Fe) tablet Take 2 tablets (420 mg total) by mouth daily. Patient taking differently: Take 420 mg by mouth 3 (three)  times daily with meals. 12/14/22  Yes Tobie Gaines, DO  gabapentin  (NEURONTIN ) 100 MG capsule Take 1 capsule (100 mg total) by mouth 2 (two) times daily. Patient taking differently: Take 100 mg by mouth 3 (three) times daily as needed (hiccups). 04/03/23  Yes Ghimire, Kuber, MD  multivitamin (RENA-VIT) TABS tablet Take 1 tablet by mouth daily. 11/22/22  Yes [provider]  olmesartan  (BENICAR ) 20 MG tablet Take 1 tablet (20 mg total) by mouth daily. 12/14/22  Yes Tobie Gaines, DO  pantoprazole  (PROTONIX ) 20 MG tablet Take 20 mg by mouth daily. 04/19/23  Yes [provider]  atorvastatin  (LIPITOR ) 80 MG tablet Take 1 tablet (80 mg total) by mouth daily. Patient not taking: Reported on 06/25/2023 12/15/22   Tobie Gaines, DO  nitroGLYCERIN  (NITROSTAT ) 0.4 MG SL tablet Place 0.4 mg under the tongue every 5 (five) minutes as needed for chest pain. Patient not taking: Reported on 05/09/2023 12/19/22   [provider]    Critical care time:    The patient is critically ill with multiple organ system failure and requires high complexity decision making for assessment and support, frequent evaluation and titration of therapies, advanced monitoring, review of  radiographic studies and interpretation of complex data.   Critical Care Time devoted to patient care services, exclusive of separately billable procedures, described in this note is 36 minutes.  Corean CHRISTELLA Nykeria Mealing, PA-C [facility] 06/28/23 12:43 PM  Please see [email or web address] for pager details.  From 7A-7P if no response, please call [phone] After hours, please call ELink [phone]

## 2023-06-28 NOTE — Anesthesia Procedure Notes (Addendum)
 Central Venous Catheter Insertion Performed by: Darlyn Rush, MD, anesthesiologist Start/End1/07/2023 6:45 AM, 06/28/2023 7:05 AM Patient location: Pre-op. Preanesthetic checklist: patient identified, IV checked, site marked, risks and benefits discussed, surgical consent, monitors and equipment checked, pre-op evaluation, timeout performed and anesthesia consent Position: Trendelenburg Lidocaine  1% used for infiltration and patient sedated Hand hygiene performed  and maximum sterile barriers used  Catheter size: 8.5 Fr Sheath introducer Procedure performed using ultrasound guided technique. Ultrasound Notes:anatomy identified, needle tip was noted to be adjacent to the nerve/plexus identified, no ultrasound evidence of intravascular and/or intraneural injection and image(s) printed for medical record Attempts: 1 Following insertion, line sutured, dressing applied and Biopatch. Post procedure assessment: blood return through all ports, free fluid flow and no air  Patient tolerated the procedure well with no immediate complications. Additional procedure comments: Triple lumen catheter placed through introducer port.

## 2023-06-28 NOTE — Progress Notes (Signed)
 0630 assessment

## 2023-06-28 NOTE — Op Note (Signed)
 [address]       [name] [address]             [phone]                                          06/28/2023 Patient:  Corey Park Pre-Op Dx: 3V CAD HTN DM ESRD   Post-op Dx:  same Procedure: CABG X 3.  LIMA LAD, RSVG distal RCA, OM   Endoscopic greater saphenous vein harvest on the right   Surgeon and Role:      * Maddisyn Hegwood, Linnie KIDD, MD - Primary    DEWAINE MICAEL Cera , PA-C - assisting An experienced assistant was required given the complexity of this surgery and the standard of surgical care. The assistant was needed for exposure, dissection, suctioning, retraction of delicate tissues and sutures, instrument exchange and for overall help during this procedure.    Anesthesia  general EBL:  500ml Blood Administration: 1plts Xclamp Time:  51 min Pump Time:   Drains: 24 F blake drain:  L, mediastinal  Wires: ventricular Counts: correct   Indications: 53yo male with 3V CAD, ESRD, and CHF who presents with NSTEMI.  No significant valvular disease and EF of 40% on echo.  The risks and benefits of CAGB were discussed in detail.  The patient is agreeable to proceed.  Findings: Good LIMA and vein.  Calcified LAD.  Intramyocardial OM.  Good distal RCA  Operative Technique: All invasive lines were placed in pre-op holding.  After the risks, benefits and alternatives were thoroughly discussed, the patient was brought to the operative theatre.  Anesthesia was induced, and the patient was prepped and draped in normal sterile fashion.  An appropriate surgical pause was performed, and pre-operative antibiotics were dosed accordingly.  We began with simultaneous incisions along the right leg for harvesting of the greater saphenous vein and the chest for the sternotomy.  In regards to the sternotomy, this was carried down with bovie cautery, and the sternum was divided with a reciprocating saw.  Meticulous hemostasis was obtained.  The left internal thoracic  artery was exposed and harvested in in pedicled fashion.  The patient was systemically heparinized, and the artery was divided distally, and placed in a papaverine  sponge.    The sternal elevator was removed, and a retractor was placed.  The pericardium was divided in the midline and fashioned into a cradle with pericardial stitches.   After we confirmed an appropriate ACT, the ascending aorta was cannulated in standard fashion.  The right atrial appendage was used for venous cannulation site.  Cardiopulmonary bypass was initiated, and the heart retractor was placed. The cross clamp was applied, and a dose of anterograde cardioplegia was given with good arrest of the heart.  We moved to the posterior wall of the heart, and found a good target on the distal RCA.  An arteriotomy was made, and the vein graft was anastomosed to it in an end to side fashion.  Next we exposed the lateral wall, and found a good target on the OM.  An end to side anastomosis with the vein graft was then created.  Finally, we exposed a good target on the  LAD, and fashioned an end to side anastomosis between it and the LITA.  We began to re-warm, and a re-animation dose of cardioplegia was given.  The heart was de-aired, and the cross clamp was removed.  Meticulous hemostasis was obtained.    A partial occludding clamp was then placed on the ascending aorta, and we created an end to side anastomosis between it and the proximal vein grafts.  Rings were placed on the proximal anastomosis.  Hemostasis was obtained, and we separated from cardiopulmonary bypass without event.  The heparin  was reversed with protamine .  Chest tubes and wires were placed, and the sternum was re-approximated with sternal wires.  The soft tissue and skin were re-approximated wth absorbable suture.    The patient tolerated the procedure without any immediate complications, and was transferred to the ICU in guarded condition.  Corey Park

## 2023-06-28 NOTE — Hospital Course (Addendum)
 Referring: No ref. provider found Primary Care: Lorren Greig PARAS, NP Primary Cardiologist:Mark Jeffrie, MD   History of Present Illness:  At time of CT surgical consultation  The patient is a 53 year old male with multiple significant cardiac risk factors including DM 2, hyperlipidemia, hypertension, and chronic systolic heart failure who presented to the ER with chest pain and non-STEMI.  The symptoms were progressive over approximately 3 to 4 days and reached a 10/10 level prompting ER visit.  EKG showed inferolateral ST depression with findings felt to be more prominent compared to previous EKGs.  He also has a right bundle branch block and left anterior fascicular block with findings of LVH.  High-sensitivity troponins were markedly elevated >10,000.  Chest x-ray also showed findings felt to be consistent with at least a component of fluid overload and other concerning findings of bilateral patchy airspace opacities.  It is noted that his dialysis schedule has been different over the holiday.  His BNP was elevated to 620.  He has end-stage renal disease and scheduled dialysis is Monday Wednesdays and Fridays.  He underwent previous left heart catheterization and June 2024 which showed moderate nonobstructive CAD including moderate left main disease.  A previous TEE done in November of this year showed mild MR with an EF of 55 to 60%.  It was noted there was moderate grade 3 protruding plaque involving the aortic arch and descending aorta and he had a negative bubble study.  A repeat echocardiogram was done on today's date showing LVEF of 40 to 45%.  RV systolic function is normal.  Valvular function appeared normal.  The full report is as described below.  He underwent repeat cardiac catheterization on today's date where he was found to have a proximal RCA lesion of 80%, and ostial circumflex to mid circumflex lesion that was 99% stenosed, a proximal LAD lesion with a 95% stenosis the mid LAD lesion at  50% stenosis.  His mid left main to ostial LAD lesion was 50%.  He was placed on a heparin  drip and CT surgical consultation was requested for consideration of coronary artery surgical revascularization.   Dr. Shyrl evaluated the patient and all relevant studies and recommended proceeding with CABG as his best revascularization option.  Hospital course:  Following full diagnostic evaluation and medical stabilization the patient was felt to be stable to proceed with surgery and on 06/28/2023 he was taken the operating room at which time he underwent CABG x 3.  The patient tolerated the procedure well and was taken to the surgical intensive care unit in stable condition.  Postoperative hospital course:  Patient was extubated without difficulty using standard post cardiac surgical protocols. Drips were weaned as hemodynamics tolerated.  All routine lines, monitors and drainage devices have been discontinued in the standard fashion. He has a known history of end-stage renal disease and nephrology assisted with postoperative management including dialysis. Of note, he had hyperkalemia post operatively but improved after urgent dialysis early 01/03. He has an expected acute blood loss anemia and was transfused intraoperatively. It has stabilized and he has not required further transfusion. He was started on low dose Lopressor . He was transitioned off the Insulin  drip. His pre op HGA1C was 5.8. He was stable for transfer from the ICU to 4E for further convalescence on 01/04. He was started on Plavix  (NSTEMI) and ec asa was decreased to 81 mg daily 01/10. H and H decreased to 6.7 and 20.7 on 01/05 so he was given one unit PRBCs.  He was requiring  a few liters of oxygen and because of desaturation, will require at discharge. He has been tolerating a diet and has had a bowel movement. All wounds are clean, dry, healing without signs of infection.He was evaluated by PT/OT. Inpatient rehab recommended SNF but patient  refuses SNF. He wants to go home with his son and mother who will help him. He was started on Lopressor  and this was changed to Coreg . He was then started on Losartan  for better BP control. His ambulation was progressing. His incisions were healing well without sign of infection. He was felt stable for discharge.

## 2023-06-28 NOTE — Transfer of Care (Signed)
 Immediate Anesthesia Transfer of Care Note  Patient: Corey Park  Procedure(s) Performed: CORONARY ARTERY BYPASS GRAFTING TIMES THREE USING LEFT INTERNAL MAMMARY ARTERY AND RIGHT GREAT SAPHENOUS VEIN HARVESTED ENDOSCOPICALLY (Chest) TRANSESOPHAGEAL ECHOCARDIOGRAM (TEE)  Patient Location: SICU  Anesthesia Type:General  Level of Consciousness: Patient remains intubated per anesthesia plan  Airway & Oxygen Therapy: Patient remains intubated per anesthesia plan  Post-op Assessment: Report given to RN and Post -op Vital signs reviewed and stable  Post vital signs: Reviewed and stable  Last Vitals:  Vitals Value Taken Time  BP 105/50   Temp 98   Pulse 77   Resp 16   SpO2 100     Last Pain:  Vitals:   06/28/23 0353  TempSrc: Oral  PainSc:          Complications: No notable events documented.

## 2023-06-28 NOTE — Procedures (Signed)
 Extubation Procedure Note  Patient Details:   Name: Corey Park DOB: October 12, 1970 MRN: [id number]   Airway Documentation:    Vent end date: 06/28/23 Vent end time: 1804   Evaluation  O2 sats: stable throughout Complications: No apparent complications Patient did tolerate procedure well. Bilateral Breath Sounds: Clear   Yes  Pt extubated per rapid wean protocol and placed on 4L [place]. Pt passed all parameters with a NIF -35 and VC 0.8L. Pt has good good, able to state name and no stridor was noted.   Shan LITTIE Collum 06/28/2023, 6:04 PM

## 2023-06-28 NOTE — Anesthesia Procedure Notes (Signed)
 Procedure Name: Intubation Date/Time: 06/28/2023 7:50 AM  Performed by: Zelphia Norleen HERO, CRNAPre-anesthesia Checklist: Patient identified, Emergency Drugs available, Suction available and Patient being monitored Patient Re-evaluated:Patient Re-evaluated prior to induction Oxygen Delivery Method: Circle system utilized Preoxygenation: Pre-oxygenation with 100% oxygen Induction Type: IV induction Ventilation: Oral airway inserted - appropriate to patient size, Mask ventilation with difficulty and Two handed mask ventilation required Laryngoscope Size: Mac and 4 Grade View: Grade II Tube type: Oral Tube size: 8.0 mm Number of attempts: 1 Airway Equipment and Method: Stylet and Oral airway Placement Confirmation: ETT inserted through vocal cords under direct vision, positive ETCO2 and breath sounds checked- equal and bilateral Secured at: 23 cm Tube secured with: Tape Dental Injury: Teeth and Oropharynx as per pre-operative assessment

## 2023-06-28 NOTE — Anesthesia Postprocedure Evaluation (Signed)
 Anesthesia Post Note  Patient: Therapist, Art  Procedure(s) Performed: CORONARY ARTERY BYPASS GRAFTING TIMES THREE USING LEFT INTERNAL MAMMARY ARTERY AND RIGHT GREAT SAPHENOUS VEIN HARVESTED ENDOSCOPICALLY (Chest) TRANSESOPHAGEAL ECHOCARDIOGRAM (TEE)     Patient location during evaluation: PACU Anesthesia Type: General Level of consciousness: sedated and patient cooperative Pain management: pain level controlled Vital Signs Assessment: post-procedure vital signs reviewed and stable Respiratory status: spontaneous breathing Cardiovascular status: stable Anesthetic complications: no   No notable events documented.  Last Vitals:  Vitals:   06/28/23 1345 06/28/23 1350  BP:    Pulse: 71 70  Resp: 16 16  Temp: 36.6 C 36.5 C  SpO2: 100% 100%    Last Pain:  Vitals:   06/28/23 0353  TempSrc: Oral  PainSc:                  Norleen Pope

## 2023-06-28 NOTE — Brief Op Note (Signed)
 06/25/2023 - 06/28/2023  11:20 AM  PATIENT:  Corey Park  53 y.o. male  PRE-OPERATIVE DIAGNOSIS:  Coronary Artery Disease  POST-OPERATIVE DIAGNOSIS:  Coronary Artery Disease  PROCEDURE:  Procedure(s): CORONARY ARTERY BYPASS GRAFTING TIMES USING LEFT INTERNAL MAMMARY ARTERY AND RIGHT GREAT SAPHENOUS VEIN HARVESTED ENDOSCOPICALLY (N/A) TRANSESOPHAGEAL ECHOCARDIOGRAM (TEE) (N/A) Vein harvest time: Vein prep time: LIMA-LAD, SVG-RCA SVG-OM  SURGEON:  Surgeons and Role:    * Lightfoot, Linnie KIDD, MD - Primary  PHYSICIAN ASSISTANT: Theia Dezeeuw PA-C  ASSISTANTS: KAYLA HAYES RN   ANESTHESIA:   general  EBL:   960 ml  BLOOD ADMINISTERED 297 ml, 1 unit platelets  DRAINS:  LEFT PLEURAL AND MEDIASTINAL DRAINS    LOCAL MEDICATIONS USED:  NONE  SPECIMEN:  No Specimen  DISPOSITION OF SPECIMEN:  N/A  COUNTS:  YES  TOURNIQUET:  * No tourniquets in log *  DICTATION: .Dragon Dictation  PLAN OF CARE: Admit to inpatient   PATIENT DISPOSITION:  ICU - intubated and hemodynamically stable.   Delay start of Pharmacological VTE agent (>24hrs) due to surgical blood loss or risk of bleeding: yes  COMPLICATIONS: NO KNOWN

## 2023-06-29 ENCOUNTER — Inpatient Hospital Stay (HOSPITAL_COMMUNITY): Payer: Medicare Other

## 2023-06-29 ENCOUNTER — Encounter (HOSPITAL_COMMUNITY): Payer: Self-pay | Admitting: Thoracic Surgery (Cardiothoracic Vascular Surgery)

## 2023-06-29 DIAGNOSIS — I214 Non-ST elevation (NSTEMI) myocardial infarction: Secondary | ICD-10-CM | POA: Diagnosis not present

## 2023-06-29 DIAGNOSIS — R7303 Prediabetes: Secondary | ICD-10-CM

## 2023-06-29 DIAGNOSIS — R739 Hyperglycemia, unspecified: Secondary | ICD-10-CM | POA: Diagnosis not present

## 2023-06-29 DIAGNOSIS — H543 Unqualified visual loss, both eyes: Secondary | ICD-10-CM

## 2023-06-29 DIAGNOSIS — Z951 Presence of aortocoronary bypass graft: Secondary | ICD-10-CM

## 2023-06-29 LAB — MAGNESIUM
Magnesium: 2 mg/dL (ref 1.7–2.4)
Magnesium: 2.3 mg/dL (ref 1.7–2.4)
Magnesium: 3.2 mg/dL — ABNORMAL HIGH (ref 1.7–2.4)

## 2023-06-29 LAB — BASIC METABOLIC PANEL
Anion gap: 12 (ref 5–15)
Anion gap: 12 (ref 5–15)
Anion gap: 14 (ref 5–15)
BUN: 13 mg/dL (ref 6–20)
BUN: 25 mg/dL — ABNORMAL HIGH (ref 6–20)
BUN: 36 mg/dL — ABNORMAL HIGH (ref 6–20)
CO2: 22 mmol/L (ref 22–32)
CO2: 25 mmol/L (ref 22–32)
CO2: 25 mmol/L (ref 22–32)
Calcium: 8.2 mg/dL — ABNORMAL LOW (ref 8.9–10.3)
Calcium: 8.6 mg/dL — ABNORMAL LOW (ref 8.9–10.3)
Calcium: 8.7 mg/dL — ABNORMAL LOW (ref 8.9–10.3)
Chloride: 96 mmol/L — ABNORMAL LOW (ref 98–111)
Chloride: 97 mmol/L — ABNORMAL LOW (ref 98–111)
Chloride: 97 mmol/L — ABNORMAL LOW (ref 98–111)
Creatinine, Ser: 12.45 mg/dL — ABNORMAL HIGH (ref 0.61–1.24)
Creatinine, Ser: 5.14 mg/dL — ABNORMAL HIGH (ref 0.61–1.24)
Creatinine, Ser: 8.58 mg/dL — ABNORMAL HIGH (ref 0.61–1.24)
GFR, Estimated: 13 mL/min — ABNORMAL LOW (ref >60)
GFR, Estimated: 4 mL/min — ABNORMAL LOW (ref >60)
GFR, Estimated: 7 mL/min — ABNORMAL LOW (ref >60)
Glucose, Bld: 111 mg/dL — ABNORMAL HIGH (ref 70–99)
Glucose, Bld: 125 mg/dL — ABNORMAL HIGH (ref 70–99)
Glucose, Bld: 133 mg/dL — ABNORMAL HIGH (ref 70–99)
Potassium: 3.6 mmol/L (ref 3.5–5.1)
Potassium: 4.8 mmol/L (ref 3.5–5.1)
Potassium: 6.5 mmol/L (ref 3.5–5.1)
Sodium: 131 mmol/L — ABNORMAL LOW (ref 135–145)
Sodium: 134 mmol/L — ABNORMAL LOW (ref 135–145)
Sodium: 135 mmol/L (ref 135–145)

## 2023-06-29 LAB — GLUCOSE, CAPILLARY
Glucose-Capillary: 103 mg/dL — ABNORMAL HIGH (ref 70–99)
Glucose-Capillary: 104 mg/dL — ABNORMAL HIGH (ref 70–99)
Glucose-Capillary: 105 mg/dL — ABNORMAL HIGH (ref 70–99)
Glucose-Capillary: 106 mg/dL — ABNORMAL HIGH (ref 70–99)
Glucose-Capillary: 109 mg/dL — ABNORMAL HIGH (ref 70–99)
Glucose-Capillary: 111 mg/dL — ABNORMAL HIGH (ref 70–99)
Glucose-Capillary: 115 mg/dL — ABNORMAL HIGH (ref 70–99)
Glucose-Capillary: 120 mg/dL — ABNORMAL HIGH (ref 70–99)
Glucose-Capillary: 121 mg/dL — ABNORMAL HIGH (ref 70–99)
Glucose-Capillary: 123 mg/dL — ABNORMAL HIGH (ref 70–99)
Glucose-Capillary: 123 mg/dL — ABNORMAL HIGH (ref 70–99)
Glucose-Capillary: 130 mg/dL — ABNORMAL HIGH (ref 70–99)
Glucose-Capillary: 98 mg/dL (ref 70–99)

## 2023-06-29 LAB — CBC
HCT: 27 % — ABNORMAL LOW (ref 39.0–52.0)
HCT: 24.7 % — ABNORMAL LOW (ref 39.0–52.0)
Hemoglobin: 9 g/dL — ABNORMAL LOW (ref 13.0–17.0)
Hemoglobin: 7.9 g/dL — ABNORMAL LOW (ref 13.0–17.0)
MCH: 32.5 pg (ref 26.0–34.0)
MCH: 32.5 pg (ref 26.0–34.0)
MCHC: 33.3 g/dL (ref 30.0–36.0)
MCHC: 32 g/dL (ref 30.0–36.0)
MCV: 97.5 fL (ref 80.0–100.0)
MCV: 101.6 fL — ABNORMAL HIGH (ref 80.0–100.0)
Platelets: 142 10*3/uL — ABNORMAL LOW (ref 150–400)
Platelets: 113 10*3/uL — ABNORMAL LOW (ref 150–400)
RBC: 2.77 MIL/uL — ABNORMAL LOW (ref 4.22–5.81)
RBC: 2.43 MIL/uL — ABNORMAL LOW (ref 4.22–5.81)
RDW: 16.7 % — ABNORMAL HIGH (ref 11.5–15.5)
RDW: 17.2 % — ABNORMAL HIGH (ref 11.5–15.5)
WBC: 10.4 10*3/uL (ref 4.0–10.5)
WBC: 10.3 10*3/uL (ref 4.0–10.5)
nRBC: 0.9 % — ABNORMAL HIGH (ref 0.0–0.2)
nRBC: 0.3 % — ABNORMAL HIGH (ref 0.0–0.2)

## 2023-06-29 LAB — BPAM PLATELET PHERESIS
Blood Product Expiration Date: 202501022359
ISSUE DATE / TIME: 202501021128
Unit Type and Rh: 5100

## 2023-06-29 LAB — PREPARE PLATELET PHERESIS: Unit division: 0

## 2023-06-29 LAB — POTASSIUM: Potassium: 4.4 mmol/L (ref 3.5–5.1)

## 2023-06-29 MED ORDER — FENTANYL CITRATE PF 50 MCG/ML IJ SOSY
25.0000 ug | PREFILLED_SYRINGE | INTRAMUSCULAR | Status: DC | PRN
  Administered 2023-06-29 – 2023-06-30 (×6): 50 ug via INTRAVENOUS
  Filled 2023-06-29 (×6): qty 1

## 2023-06-29 MED ORDER — INSULIN GLARGINE-YFGN 100 UNIT/ML ~~LOC~~ SOLN
10.0000 [IU] | Freq: Two times a day (BID) | SUBCUTANEOUS | Status: DC
  Administered 2023-06-29 – 2023-06-30 (×2): 10 [IU] via SUBCUTANEOUS
  Filled 2023-06-29 (×3): qty 0.1

## 2023-06-29 MED ORDER — INSULIN GLARGINE-YFGN 100 UNIT/ML ~~LOC~~ SOLN
10.0000 [IU] | Freq: Once | SUBCUTANEOUS | Status: AC
  Administered 2023-06-29: 10 [IU] via SUBCUTANEOUS
  Filled 2023-06-29: qty 0.1

## 2023-06-29 MED ORDER — INSULIN ASPART 100 UNIT/ML IJ SOLN
0.0000 [IU] | Freq: Three times a day (TID) | INTRAMUSCULAR | Status: DC
  Administered 2023-06-29 – 2023-06-30 (×2): 3 [IU] via SUBCUTANEOUS
  Administered 2023-07-01 – 2023-07-04 (×2): 2 [IU] via SUBCUTANEOUS
  Administered 2023-07-05: 3 [IU] via SUBCUTANEOUS
  Administered 2023-07-06 – 2023-07-07 (×2): 2 [IU] via SUBCUTANEOUS

## 2023-06-29 NOTE — Progress Notes (Signed)
   Patient Name: Corey Park Date of Encounter: 06/29/2023 [facility] Cardiologist: Oneil Parchment, MD   Interval Summary  .    Feels well. Asking for some ice chips.  Required urgent dialysis this morning due to hyperkalemia K 7.2  Vital Signs .    Vitals:   06/29/23 0830 06/29/23 0845 06/29/23 0900 06/29/23 0915  BP:      Pulse: 100 97 97 96  Resp: (!) 21 (!) 21 (!) 21 20  Temp: 99.1 F (37.3 C) 99.1 F (37.3 C) 99 F (37.2 C) 99 F (37.2 C)  TempSrc:      SpO2: 99% 98% 98% 99%  Weight:      Height:        Intake/Output Summary (Last 24 hours) at 06/29/2023 1001 Last data filed at 06/29/2023 0800 Gross per 24 hour  Intake 4834.39 ml  Output 3395 ml  Net 1439.39 ml      06/29/2023    6:00 AM 06/28/2023    6:46 AM 06/27/2023    6:10 PM  Last 3 Weights  Weight (lbs) 231 lb 228 lb 2.8 oz 228 lb 2.8 oz  Weight (kg) 104.781 kg 103.5 kg 103.5 kg      Telemetry/ECG     - Personally Reviewed No significant arrhythmia   Physical Exam .   Physical Exam Vitals and nursing note reviewed.  Constitutional:      General: He is not in acute distress. Neck:     Vascular: No JVD.  Cardiovascular:     Rate and Rhythm: Normal rate and regular rhythm.     Heart sounds: Normal heart sounds. No murmur heard.    Comments: Dressing in place over sternotomy incision Chest tube in place Pulmonary:     Effort: Pulmonary effort is normal.     Breath sounds: Normal breath sounds. No wheezing or rales.      Assessment & Plan .     53 y/o male w/hypertension, type 2 diabetes mellitus, ESRD on HD, moderate CAD (cath 11/2022), admitted with chest pain, NSTEMI   NSTEMI: Severe multivessel CAD. S/p CABGX3 (LIMA-LAD, R SVG-distal RCA, OM) POD#1 Required urgent dialysis this morning due to hyperkalemia, K7.2. Otherwise doing well. Continue aspirin  325 mg daily, Lipitor  80 mg daily. Blood pressure improved since dialysis. Okay to continue low-dose metoprolol  tartrate for  now, transition to home medication Coreg  later.   For questions or updates, please contact [facility] Please consult [email or web address] for contact info under        Signed, Newman JINNY Lawrence, MD

## 2023-06-29 NOTE — TOC Initial Note (Signed)
 Transition of Care [facility]) - Initial/Assessment Note    Patient Details  Name: Corey Park MRN: [id number] Date of Birth: 03-24-1971  Transition of Care [facility]) CM/SW Contact:    Sudie Erminio Deems, RN Phone Number: 06/29/2023, 1:29 PM  Clinical Narrative:  Patient presented for chest pain-POD-1 CABG. Patient asked Case Manager to speak with his mom that was at the bedside. Case Manager spoke with [name] and she states patient is blind, lives alone; however, his son is there occasionally due to college. Patient has transportation to appointments via [facility] [place]. Patient will benefit from PT/OT consult once stable for recommendations. Case Manager will continue to follow for additional needs as the patient progresses.                Expected Discharge Plan:  (TBD) Barriers to Discharge: Continued Medical Work up  Expected Discharge Plan and Services In-house Referral: NA Discharge Planning Services: CM Consult   Living arrangements for the past 2 months: Apartment   Prior Living Arrangements/Services Living arrangements for the past 2 months: Apartment Lives with:: Self (son comes in intermittently- in college) Patient language and need for interpreter reviewed:: Yes (Patient is Blind- he asked me to speak with his mom during the visit.)        Need for Family Participation in Patient Care: Yes (Comment) Care giver support system in place?: Yes (comment)    Emotional Assessment Appearance:: Appears stated age Attitude/Demeanor/Rapport: Unable to Assess Affect (typically observed): Unable to Assess Orientation: : Oriented to Self, Oriented to Place Alcohol / Substance Use: Not Applicable Psych Involvement: No (comment)  Admission diagnosis:  NSTEMI (non-ST elevated myocardial infarction) (HCC) [I21.4] ESRD on hemodialysis (HCC) [N18.6, Z99.2] Chest pain, unspecified type [R07.9] S/P CABG x 3 [Z95.1] Patient Active Problem List   Diagnosis Date Noted   S/P CABG  x 3 06/28/2023   Hx of CABG 06/28/2023   NSTEMI (non-ST elevated myocardial infarction) (HCC) 06/25/2023   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   Obesity (BMI 30-39.9) 05/09/2023   Chest pain 05/09/2023   Acute hypoxic respiratory failure (HCC) 04/02/2023   Fluid overload 04/02/2023   Chest pain, atypical 01/03/2023   Anemia 12/12/2022   Iron  deficiency anemia due to chronic blood loss 12/11/2022   Syncope and collapse 10/19/2022   Nonspecific chest pain 09/12/2022   Myocardial injury 08/06/2022   Non-cardiac chest pain 08/05/2022   (HFpEF) heart failure with preserved ejection fraction (HCC) 08/05/2022   Need for assistance at home without other household member able to render care 08/05/2022   Adult general medical exam 07/11/2022   Pneumonia 04/17/2022   Sepsis (HCC) 04/17/2022   Acute blood loss anemia 04/17/2022   Essential hypertension 04/17/2022   Thrombocytopenia (HCC) 04/17/2022   History of GI bleed 04/17/2022   Atypical chest pain 04/17/2022   GERD (gastroesophageal reflux disease) 04/17/2022   Nausea and vomiting 04/17/2022   Chronic hiccups 04/17/2022   Pain due to onychomycosis of toenails of both feet 10/11/2021   Hypercalcemia 04/13/2020   Coagulation defect, unspecified (HCC) 08/25/2019   Secondary hyperparathyroidism of renal origin (HCC) 08/25/2019   History of anemia due to chronic kidney disease 12/23/2018   End-stage renal disease on hemodialysis (HCC)    Chronic systolic heart failure (HCC) 07/03/2018   Elevated troponin    Hypertensive urgency 12/11/2016   HLD (hyperlipidemia) 06/14/2007   DM2 (diabetes mellitus, type 2) (HCC) 06/11/2007   Hypertensive heart disease with CHF (congestive heart failure) (HCC) 06/11/2007   PCP:  Lorren,  Greig PARAS, NP Pharmacy:  No Pharmacies Listed  Social Drivers of Health (SDOH) Social History: SDOH Screenings   Food Insecurity: No Food Insecurity (06/26/2023)  Housing: Unknown (06/26/2023)  Recent Concern: Housing -  Medium Risk (04/02/2023)  Transportation Needs: No Transportation Needs (06/26/2023)  Recent Concern: Transportation Needs - Unmet Transportation Needs (05/09/2023)  Utilities: Not At Risk (06/26/2023)  Depression (PHQ2-9): Low Risk  (08/10/2022)  Tobacco Use: Low Risk  (06/28/2023)   Readmission Risk Interventions    06/29/2023    1:27 PM 12/13/2022   10:39 AM  Readmission Risk Prevention Plan  Transportation Screening Complete Complete  PCP or Specialist Appt within 3-5 Days  Complete  HRI or Home Care Consult  Complete  Social Work Consult for Recovery Care Planning/Counseling  Complete  Palliative Care Screening  Complete  Medication Review Oceanographer) Referral to Pharmacy Referral to Pharmacy  HRI or Home Care Consult Complete   SW Recovery Care/Counseling Consult Complete   Palliative Care Screening Not Applicable

## 2023-06-29 NOTE — Discharge Summary (Signed)
 [address]       [place] [address]             [phone]    Physician Discharge Summary  Patient ID: Corey Park MRN: [id number] DOB/AGE: 1971/02/19 53 y.o.  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   Obesity (BMI 30-39.9) 05/09/2023   Chest pain 05/09/2023   Acute hypoxic respiratory failure (HCC) 04/02/2023   Fluid overload 04/02/2023   Chest pain, atypical 01/03/2023   Anemia 12/12/2022   Iron  deficiency anemia due to chronic blood loss 12/11/2022   Syncope and collapse 10/19/2022   Nonspecific chest pain 09/12/2022   Myocardial injury 08/06/2022   Non-cardiac chest pain 08/05/2022   (HFpEF) heart failure with preserved ejection fraction (HCC) 08/05/2022   Need for assistance at home without other household member able to render care 08/05/2022   Adult general medical exam 07/11/2022   Pneumonia 04/17/2022   Sepsis (HCC) 04/17/2022   Acute blood loss anemia 04/17/2022   Essential hypertension 04/17/2022   Thrombocytopenia (HCC) 04/17/2022   History of GI bleed 04/17/2022   Atypical chest pain 04/17/2022   GERD (gastroesophageal reflux disease) 04/17/2022   Nausea and vomiting 04/17/2022   Chronic hiccups 04/17/2022   Pain due to onychomycosis of toenails of both feet 10/11/2021   Hypercalcemia 04/13/2020   Coagulation defect, unspecified (HCC) 08/25/2019   Secondary hyperparathyroidism of renal origin (HCC) 08/25/2019   History of anemia due to chronic kidney disease 12/23/2018   End-stage renal disease on hemodialysis (HCC)    Chronic systolic heart failure (HCC) 07/03/2018   Elevated troponin    Hypertensive  urgency 12/11/2016   HLD (hyperlipidemia) 06/14/2007   DM2 (diabetes mellitus, type 2) (HCC) 06/11/2007   Hypertensive heart disease with CHF (congestive heart failure) (HCC) 06/11/2007     Discharge Diagnoses:  Patient Active Problem List   Diagnosis Date Noted   Hyperglycemia 06/29/2023   Pre-diabetes 06/29/2023   Blindness of both eyes 06/29/2023   S/P CABG x 3 06/28/2023   Hx of CABG 06/28/2023   NSTEMI (non-ST elevated myocardial infarction) (HCC) 06/25/2023   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   Obesity (BMI 30-39.9) 05/09/2023   Chest pain 05/09/2023   Acute hypoxic respiratory failure (HCC) 04/02/2023   Fluid overload 04/02/2023   Chest pain, atypical 01/03/2023   Anemia 12/12/2022   Iron  deficiency anemia due to chronic blood loss 12/11/2022   Syncope and collapse 10/19/2022   Nonspecific chest pain 09/12/2022   Myocardial injury 08/06/2022   Non-cardiac chest pain 08/05/2022   (HFpEF) heart failure with preserved ejection fraction (HCC) 08/05/2022   Need for assistance at home without other household member able to render care 08/05/2022   Adult general medical exam 07/11/2022   Pneumonia 04/17/2022   Sepsis (HCC) 04/17/2022   Acute blood loss anemia 04/17/2022   Essential hypertension 04/17/2022   Thrombocytopenia (HCC) 04/17/2022   History of GI bleed 04/17/2022   Atypical chest pain 04/17/2022   GERD (gastroesophageal reflux disease) 04/17/2022   Nausea and vomiting 04/17/2022   Chronic hiccups 04/17/2022   Pain due to onychomycosis of toenails of both feet 10/11/2021   Hypercalcemia 04/13/2020  Coagulation defect, unspecified (HCC) 08/25/2019   Secondary hyperparathyroidism of renal origin (HCC) 08/25/2019   History of anemia due to chronic kidney disease 12/23/2018   End-stage renal disease on hemodialysis (HCC)    Chronic systolic heart failure (HCC) 07/03/2018   Elevated troponin    Hypertensive urgency 12/11/2016   HLD (hyperlipidemia) 06/14/2007   DM2  (diabetes mellitus, type 2) (HCC) 06/11/2007   Hypertensive heart disease with CHF (congestive heart failure) (HCC) 06/11/2007     Discharged Condition: Stable    Referring: No ref. provider found Primary Care: Lorren Greig PARAS, NP Primary Cardiologist:Mark Jeffrie, MD   History of Present Illness:  At time of CT surgical consultation  The patient is a 53 year old male with multiple significant cardiac risk factors including DM 2, hyperlipidemia, hypertension, and chronic systolic heart failure who presented to the ER with chest pain and non-STEMI.  The symptoms were progressive over approximately 3 to 4 days and reached a 10/10 level prompting ER visit.  EKG showed inferolateral ST depression with findings felt to be more prominent compared to previous EKGs.  He also has a right bundle branch block and left anterior fascicular block with findings of LVH.  High-sensitivity troponins were markedly elevated >10,000.  Chest x-ray also showed findings felt to be consistent with at least a component of fluid overload and other concerning findings of bilateral patchy airspace opacities.  It is noted that his dialysis schedule has been different over the holiday.  His BNP was elevated to 620.  He has end-stage renal disease and scheduled dialysis is Monday Wednesdays and Fridays.  He underwent previous left heart catheterization and June 2024 which showed moderate nonobstructive CAD including moderate left main disease.  A previous TEE done in November of this year showed mild MR with an EF of 55 to 60%.  It was noted there was moderate grade 3 protruding plaque involving the aortic arch and descending aorta and he had a negative bubble study.  A repeat echocardiogram was done on today's date showing LVEF of 40 to 45%.  RV systolic function is normal.  Valvular function appeared normal.  The full report is as described below.  He underwent repeat cardiac catheterization on today's date where he was found to  have a proximal RCA lesion of 80%, and ostial circumflex to mid circumflex lesion that was 99% stenosed, a proximal LAD lesion with a 95% stenosis the mid LAD lesion at 50% stenosis.  His mid left main to ostial LAD lesion was 50%.  He was placed on a heparin  drip and CT surgical consultation was requested for consideration of coronary artery surgical revascularization.   Dr. Shyrl evaluated the patient and all relevant studies and recommended proceeding with CABG as his best revascularization option.  Hospital course:  Following full diagnostic evaluation and medical stabilization the patient was felt to be stable to proceed with surgery and on 06/28/2023 he was taken the operating room at which time he underwent CABG x 3.  The patient tolerated the procedure well and was taken to the surgical intensive care unit in stable condition.  Postoperative hospital course:  Patient was extubated without difficulty using standard post cardiac surgical protocols. Drips were weaned as hemodynamics tolerated.  All routine lines, monitors and drainage devices have been discontinued in the standard fashion. He has a known history of end-stage renal disease and nephrology assisted with postoperative management including dialysis. Of note, he had hyperkalemia post operatively but improved after urgent dialysis early 01/03. He  has an expected acute blood loss anemia and was transfused intraoperatively. It has stabilized and he has not required further transfusion. He was started on low dose Lopressor . He was transitioned off the Insulin  drip. His pre op HGA1C was 5.8. He was stable for transfer from the ICU to 4E for further convalescence on 01/04. He was started on Plavix  (NSTEMI) and ec asa was decreased to 81 mg daily 01/10. H and H decreased to 6.7 and 20.7 on 01/05 so he was given one unit PRBCs. He was requiring  a few liters of oxygen and because of desaturation, will require at discharge. He has been tolerating  a diet and has had a bowel movement. All wounds are clean, dry, healing without signs of infection.He was evaluated by PT/OT. Inpatient rehab recommended SNF but patient refuses SNF. He wants to go home with his son and mother who will help him. He was started on Lopressor  and this was changed to Coreg . He was then started on Losartan  for better BP control. His ambulation was progressing. His incisions were healing well without sign of infection. He was felt stable for discharge.  Consults: cardiology, pulmonary/intensive care, and nephrology  Significant Diagnostic Studies:   Narrative & Impression  CLINICAL DATA:  Pleural effusion.   EXAM: CHEST - 2 VIEW   COMPARISON:  July 02, 2023.   FINDINGS: Stable cardiomegaly. Status post coronary artery bypass graft. Right lung is clear. Mild left basilar atelectasis or infiltrate is noted with probable small left pleural effusion. Bony thorax is unremarkable.   IMPRESSION: Mild left basilar subsegmental atelectasis or infiltrate is noted with probable small left pleural effusion.     Electronically Signed   By: Lynwood Landy Raddle M.D.   On: 07/05/2023 10:34   DG Chest 2 View Result Date: 07/05/2023 CLINICAL DATA:  Pleural effusion. EXAM: CHEST - 2 VIEW COMPARISON:  July 02, 2023. FINDINGS: Stable cardiomegaly. Status post coronary artery bypass graft. Right lung is clear. Mild left basilar atelectasis or infiltrate is noted with probable small left pleural effusion. Bony thorax is unremarkable. IMPRESSION: Mild left basilar subsegmental atelectasis or infiltrate is noted with probable small left pleural effusion. Electronically Signed   By: Lynwood Landy Raddle M.D.   On: 07/05/2023 10:34   DG Chest 2 View Result Date: 07/02/2023 CLINICAL DATA:  Status post CABG on 06/28/2023.  Pleural effusion. EXAM: CHEST - 2 VIEW COMPARISON:  Chest radiograph dated June 30, 2023. FINDINGS: Low lung volumes. Stable cardiomegaly. Status post median sternotomy  and CABG. Bilateral interstitial prominence, suggestive of pulmonary vascular congestion. Small left-greater-than-right pleural effusions with associated basilar atelectasis. No pneumothorax. No acute osseous abnormality. IMPRESSION: 1. Small left-greater-than-right pleural effusions with associated basilar atelectasis. 2. Low lung volumes. Cardiomegaly with pulmonary vascular congestion. Electronically Signed   By: Harrietta Sherry M.D.   On: 07/02/2023 08:23   DG CHEST PORT 1 VIEW Result Date: 06/30/2023 CLINICAL DATA:  Pleural effusion, chest pain EXAM: PORTABLE CHEST 1 VIEW COMPARISON:  Chest x-ray 06/29/2023 FINDINGS: Sternotomy wires are again seen. The heart is enlarged, unchanged. There central pulmonary vascular congestion. There are atelectatic changes in the lung bases. There is a small left pleural effusion. Lung volumes are low. There is no pneumothorax or acute fracture. IMPRESSION: 1. Cardiomegaly with central pulmonary vascular congestion. 2. Small left pleural effusion. 3. Low lung volumes with bibasilar atelectasis. Electronically Signed   By: Greig Pique M.D.   On: 06/30/2023 15:27   DG Chest Port 1 View Result Date:  06/29/2023 CLINICAL DATA:  [id number] S/P CABG x 3 [id number] EXAM: PORTABLE CHEST 1 VIEW COMPARISON:  Chest radiograph from one day prior. FINDINGS: Right internal jugular central venous catheter terminates in the lower third of the SVC, unchanged. Intact sternotomy wires. Stable mediastinal drain terminating over the left mid mediastinum. Stable cardiomediastinal silhouette with mild cardiomegaly. No pneumothorax. Stable small bilateral pleural effusions and moderate patchy bibasilar hazy opacities with low lung volumes. No overt pulmonary edema. IMPRESSION: 1. Stable small bilateral pleural effusions, low lung volumes and moderate patchy bibasilar hazy opacities, favor atelectasis. 2. Stable mild cardiomegaly without overt pulmonary edema. Electronically Signed   By: Selinda DELENA Blue  M.D.   On: 06/29/2023 10:16   DG CHEST PORT 1 VIEW Result Date: 06/29/2023 CLINICAL DATA:  Hypoxia EXAM: PORTABLE CHEST 1 VIEW COMPARISON:  Film from earlier in the same day. FINDINGS: Endotracheal tube and gastric catheter have been removed. Right jugular central line is again noted and stable. Heart is enlarged. Postsurgical changes are seen. Bibasilar atelectasis is noted with a small right-sided pleural effusion. IMPRESSION: Bibasilar atelectasis and small pleural effusion. Electronically Signed   By: Oneil Devonshire M.D.   On: 06/29/2023 00:05   DG Chest Port 1 View Result Date: 06/28/2023 CLINICAL DATA:  Status post CABG EXAM: PORTABLE CHEST 1 VIEW COMPARISON:  06/25/2023 FINDINGS: Single frontal view of the chest demonstrates endotracheal tube overlying tracheal air column tip at level of thoracic inlet. Enteric catheter passes below diaphragm, tip and side port project over the gastric fundus. Right internal jugular catheter tip overlies superior vena cava. Median sternotomy wires are noted. The cardiac silhouette is enlarged. Lung volumes are diminished, with crowding of the central vasculature. Retrocardiac atelectasis. Small bilateral pleural effusions with blunting of the costophrenic angles. No evidence of pneumothorax. IMPRESSION: 1. Postsurgical changes from CABG.  Support devices as above. 2. Hypoventilatory changes, with small bilateral pleural effusions. Electronically Signed   By: Ozell Daring M.D.   On: 06/28/2023 15:44   ECHO INTRAOPERATIVE TEE Result Date: 06/28/2023  *INTRAOPERATIVE TRANSESOPHAGEAL REPORT *  Patient Name:   ABNER ARDIS Date of Exam: 06/28/2023 Medical Rec #:  [id number]       Height:       73.0 in Accession #:    [id number]      Weight:       228.2 lb Date of Birth:  27-Apr-1971       BSA:          2.28 m Patient Age:    52 years        BP:           125/74 mmHg Patient Gender: M               HR:           89 bpm. Exam Location:  Anesthesiology Transesophogeal exam was  perform intraoperatively during surgical procedure. Patient was closely monitored under general anesthesia during the entirety of examination. Indications:     CAD Native Vessel i25.10 Sonographer:     Damien Senior RDCS Performing Phys: [id number] LINNIE KIDD LIGHTFOOT Diagnosing Phys: Norleen Pope MD Complications: No known complications during this procedure. POST-OP IMPRESSIONS _ Left Ventricle: has mildly reduced systolic function, with an ejection fraction of 40%. The cavity size was normal. The wall motion is abnormal with regional variation. _ Right Ventricle: The right ventricle appears unchanged from pre-bypass. _ Aorta: The aorta appears unchanged from pre-bypass. _ Left Atrial Appendage: The left atrial appendage appears unchanged  from pre-bypass. _ Aortic Valve: The aortic valve appears unchanged from pre-bypass. _ Mitral Valve: The mitral valve appears unchanged from pre-bypass. _ Tricuspid Valve: The tricuspid valve appears unchanged from pre-bypass. _ Pulmonic Valve: The pulmonic valve appears unchanged from pre-bypass. _ Interatrial Septum: The interatrial septum appears unchanged from pre-bypass. PRE-OP FINDINGS  Left Ventricle: The left ventricle has severely reduced systolic function, with an ejection fraction of 20-30%. The cavity size was normal. Left ventrical global hypokinesis without regional wall motion abnormalities. There is mild concentric left ventricular hypertrophy. Right Ventricle: The right ventricle has mildly reduced systolic function. The cavity was normal. There is no increase in right ventricular wall thickness. Left Atrium: Left atrial size was normal in size. No left atrial/left atrial appendage thrombus was detected. The left atrial appendage is well visualized and there is no evidence of thrombus present. Right Atrium: Right atrial size was normal in size. Interatrial Septum: No atrial level shunt detected by color flow Doppler. There is no evidence of a patent foramen ovale.  Pericardium: There is no evidence of pericardial effusion. Mitral Valve: The mitral valve is normal in structure. Mitral valve regurgitation is mild by color flow Doppler. There is no evidence of mitral valve vegetation. Tricuspid Valve: The tricuspid valve was normal in structure. Tricuspid valve regurgitation is trivial by color flow Doppler. Aortic Valve: The aortic valve is normal in structure. Aortic valve regurgitation was not visualized by color flow Doppler. There is no stenosis of the aortic valve. Pulmonic Valve: The pulmonic valve was normal in structure. Pulmonic valve regurgitation is trivial by color flow Doppler. Aorta: There is evidence of plaque in the aortic root, descending aorta and ascending aorta; Grade II, measuring 2-19mm in size. Shunts: There is no evidence of an atrial septal defect.  Norleen Pope MD Electronically signed by Norleen Pope MD Signature Date/Time: 06/28/2023/2:06:54 PM    Final    VAS US  DOPPLER PRE CABG Result Date: 06/28/2023 PREOPERATIVE VASCULAR EVALUATION Patient Name:  ASMAR BROZEK  Date of Exam:   06/27/2023 Medical Rec #: [id number]        Accession #:    [id number] Date of Birth: May 27, 1971        Patient Gender: M Patient Age:   25 years Exam [facility]  [facility] Procedure:      VAS US  DOPPLER PRE CABG Referring Phys: HARRELL LIGHTFOOT --------------------------------------------------------------------------------  Indications:      Pre-CABG. Risk Factors:     Hypertension, hyperlipidemia, Diabetes, no history of smoking,                   prior MI. Other Factors:    ESRD (HD), CHF. Limitations:      poor tissue/ultrasound interface, patient agitation/movement Comparison Study: No previous exams Performing Technologist: Jody Hill RVT, RDMS  Examination Guidelines: A complete evaluation includes B-mode imaging, spectral Doppler, color Doppler, and power Doppler as needed of all accessible portions of each vessel. Bilateral testing is considered an  integral part of a complete examination. Limited examinations for reoccurring indications may be performed as noted.  Right Carotid Findings: +----------+--------+--------+--------+--------------------+------------------+           PSV cm/sEDV cm/sStenosisDescribe            Comments           +----------+--------+--------+--------+--------------------+------------------+ CCA Prox  79      17              calcific and diffuseintimal thickening +----------+--------+--------+--------+--------------------+------------------+ CCA Distal59  9               calcific            intimal thickening +----------+--------+--------+--------+--------------------+------------------+ ICA Prox  54      17      1-39%   calcific                               +----------+--------+--------+--------+--------------------+------------------+ ICA Distal45      14                                                     +----------+--------+--------+--------+--------------------+------------------+ ECA       55      0               calcific                               +----------+--------+--------+--------+--------------------+------------------+ +----------+--------+-------+--------+------------+           PSV cm/sEDV cmsDescribeArm Pressure +----------+--------+-------+--------+------------+ Subclavian198     52     Stenotic             +----------+--------+-------+--------+------------+ +---------+--------+--+--------+-+----------+ VertebralPSV cm/s31EDV cm/s5Retrograde +---------+--------+--+--------+-+----------+ Left Carotid Findings: +----------+--------+--------+--------+--------+-------------------+           PSV cm/sEDV cm/sStenosisDescribeComments            +----------+--------+--------+--------+--------+-------------------+ CCA Prox  85      7               calcific                    +----------+--------+--------+--------+--------+-------------------+  CCA Distal108     12              calcificintimal thickening  +----------+--------+--------+--------+--------+-------------------+ ICA Prox  62      16      1-39%   calcific                    +----------+--------+--------+--------+--------+-------------------+ ICA Distal61      17                                          +----------+--------+--------+--------+--------+-------------------+ ECA       43      0                       not well visualized +----------+--------+--------+--------+--------+-------------------+  +----------+--------+--------+----------------+------------+ SubclavianPSV cm/sEDV cm/sDescribe        Arm Pressure +----------+--------+--------+----------------+------------+           97              Multiphasic, WNL             +----------+--------+--------+----------------+------------+ +---------+--------+--+--------+--+---------+ VertebralPSV cm/s46EDV cm/s15Antegrade +---------+--------+--+--------+--+---------+   Left Doppler Findings: +--------+---------+ Site    Doppler   +--------+---------+ Brachialtriphasic +--------+---------+ Radial  triphasic +--------+---------+ Ulnar   biphasic  +--------+---------+  Technologist Notes: RUE -HD access.  Summary: Right Carotid: Velocities in the right ICA are consistent with a 1-39% stenosis. Left Carotid: Velocities in the left ICA are consistent with a 1-39% stenosis. Vertebrals:  Left vertebral artery demonstrates antegrade  flow. Right vertebral              artery demonstrates retrograde flow. Subclavians: Right subclavian artery was stenotic. Normal flow hemodynamics were              seen in the left subclavian artery. Right ABI: PTA - biphasic, ATA - monophasic, PeroA - absent, DPA - monophasic. Left ABI: PTA & ATA - biphasic, PeroA - absent, DPA - monophasic. Right Upper Extremity: Not assessed - HD access. Left Upper Extremity: Doppler waveforms remain within normal limits with left  radial compression. Doppler waveform obliterates with left ulnar compression.  Electronically signed by Penne Colorado MD on 06/28/2023 at 8:11:47 AM.    Final    CARDIAC CATHETERIZATION Result Date: 06/25/2023   Prox RCA lesion is 80% stenosed.   Ost Cx to Mid Cx lesion is 99% stenosed.   Prox LAD lesion is 95% stenosed.   Mid LAD lesion is 50% stenosed.   Mid LM to Ost LAD lesion is 50% stenosed. Severe three vessel CAD Moderate distal left main stenosis Severe, heavily calcified proximal LAD stenosis Severe stenosis (likely ulcerated plaque) in the ostial/proximal Circumflex artery Severe stenosis in the proximal RCA (Moderate caliber co-dominant vessel) Elevated LV filling pressures Recommendations: I would recommend surgical revascularization with CABG in this diabetic male with multi-vessel CAD, ESRD on HD. Will consult CT surgery. Resume IV heparin  2 hours post sheath removal.   ECHOCARDIOGRAM COMPLETE Result Date: 06/25/2023    ECHOCARDIOGRAM REPORT   Patient Name:   JESSEE MEZERA Date of Exam: 06/25/2023 Medical Rec #:  [id number]       Height:       73.0 in Accession #:    [id number]      Weight:       216.0 lb Date of Birth:  September 07, 1970       BSA:          2.223 m Patient Age:    52 years        BP:           109/99 mmHg Patient Gender: M               HR:           82 bpm. Exam Location:  Inpatient Procedure: 2D Echo, 3D Echo, Cardiac Doppler, Color Doppler and Strain Analysis Indications:    Stroke  History:        Patient has prior history of Echocardiogram examinations, most                 recent 04/03/2023. Risk Factors:Diabetes, Hypertension and                 Dyslipidemia.  Sonographer:    Ozell Free Referring Phys: [id number] JUSTIN B HOWERTER  Sonographer Comments: Global longitudinal strain was attempted. IMPRESSIONS  1. Left ventricular ejection fraction, by estimation, is 40 to 45%. The left ventricle has mildly decreased function. The left ventricle demonstrates regional wall motion  abnormalities (see scoring diagram/findings for description). Left ventricular diastolic parameters are consistent with Grade II diastolic dysfunction (pseudonormalization). GLS -8.6%.  2. Right ventricular systolic function is normal. The right ventricular size is normal.  3. The mitral valve is normal in structure. No evidence of mitral valve regurgitation. No evidence of mitral stenosis.  4. The aortic valve is normal in structure. Aortic valve regurgitation is not visualized. No aortic stenosis is present.  5. The inferior vena cava is normal in size with greater  than 50% respiratory variability, suggesting right atrial pressure of 3 mmHg. FINDINGS  Left Ventricle: Left ventricular ejection fraction, by estimation, is 40 to 45%. The left ventricle has mildly decreased function. The left ventricle demonstrates regional wall motion abnormalities. The left ventricular internal cavity size was normal in size. There is no left ventricular hypertrophy. Left ventricular diastolic parameters are consistent with Grade II diastolic dysfunction (pseudonormalization).  LV Wall Scoring: The inferior wall and posterior wall are hypokinetic. Right Ventricle: The right ventricular size is normal. No increase in right ventricular wall thickness. Right ventricular systolic function is normal. Left Atrium: Left atrial size was normal in size. Right Atrium: Right atrial size was normal in size. Pericardium: There is no evidence of pericardial effusion. Mitral Valve: The mitral valve is normal in structure. No evidence of mitral valve regurgitation. No evidence of mitral valve stenosis. Tricuspid Valve: The tricuspid valve is normal in structure. Tricuspid valve regurgitation is trivial. No evidence of tricuspid stenosis. Aortic Valve: The aortic valve is normal in structure. Aortic valve regurgitation is not visualized. No aortic stenosis is present. Aortic valve mean gradient measures 5.0 mmHg. Aortic valve peak gradient measures  8.4 mmHg. Aortic valve area, by VTI measures 2.12 cm. Pulmonic Valve: The pulmonic valve was normal in structure. Pulmonic valve regurgitation is trivial. No evidence of pulmonic stenosis. Aorta: The aortic root is normal in size and structure. Venous: The inferior vena cava is normal in size with greater than 50% respiratory variability, suggesting right atrial pressure of 3 mmHg. IAS/Shunts: No atrial level shunt detected by color flow Doppler.  LEFT VENTRICLE PLAX 2D LVIDd:         5.60 cm      Diastology LVIDs:         4.60 cm      LV e' medial:    5.22 cm/s LV PW:         1.10 cm      LV E/e' medial:  17.9 LV IVS:        1.10 cm      LV e' lateral:   9.46 cm/s LVOT diam:     2.10 cm      LV E/e' lateral: 9.9 LV SV:         59 LV SV Index:   27 LVOT Area:     3.46 cm                              3D Volume EF: LV Volumes (MOD)            3D EF:        37 % LV vol d, MOD A2C: 147.0 ml LV EDV:       239 ml LV vol d, MOD A4C: 148.0 ml LV ESV:       151 ml LV vol s, MOD A2C: 84.2 ml  LV SV:        88 ml LV vol s, MOD A4C: 91.0 ml LV SV MOD A2C:     62.8 ml LV SV MOD A4C:     148.0 ml LV SV MOD BP:      61.3 ml RIGHT VENTRICLE             IVC RV Basal diam:  4.00 cm     IVC diam: 2.00 cm RV S prime:     15.20 cm/s TAPSE (M-mode): 2.3 cm LEFT ATRIUM  Index        RIGHT ATRIUM           Index LA diam:        3.90 cm 1.75 cm/m   RA Area:     12.80 cm LA Vol (A2C):   85.6 ml 38.50 ml/m  RA Volume:   32.70 ml  14.71 ml/m LA Vol (A4C):   46.1 ml 20.74 ml/m LA Biplane Vol: 64.4 ml 28.97 ml/m  AORTIC VALVE AV Area (Vmax):    2.25 cm AV Area (Vmean):   2.10 cm AV Area (VTI):     2.12 cm AV Vmax:           145.00 cm/s AV Vmean:          101.000 cm/s AV VTI:            0.279 m AV Peak Grad:      8.4 mmHg AV Mean Grad:      5.0 mmHg LVOT Vmax:         94.30 cm/s LVOT Vmean:        61.300 cm/s LVOT VTI:          0.171 m LVOT/AV VTI ratio: 0.61  AORTA Ao Root diam: 3.60 cm Ao Asc diam:  3.20 cm MITRAL VALVE MV  Area (PHT): 4.57 cm    SHUNTS MV Decel Time: 166 msec    Systemic VTI:  0.17 m MV E velocity: 93.30 cm/s  Systemic Diam: 2.10 cm MV A velocity: 72.80 cm/s MV E/A ratio:  1.28 Aditya Sabharwal Electronically signed by Ria Commander Signature Date/Time: 06/25/2023/9:17:22 AM    Final    DG Chest Port 1 View Result Date: 06/25/2023 CLINICAL DATA:  cp EXAM: PORTABLE CHEST 1 VIEW COMPARISON:  CT angio chest 06/01/2023, chest x-ray 06/01/2023 FINDINGS: The heart and mediastinal contours are unchanged. Atherosclerotic plaque Interval development of patchy bilateral mid to lower lung zone airspace opacities. No pulmonary edema. No pleural effusion. No pneumothorax. No acute osseous abnormality. IMPRESSION: 1. Interval development of patchy bilateral mid to lower lung zone airspace opacities. Followup PA and lateral chest X-ray is recommended in 3-4 weeks following therapy to ensure resolution. 2.  Aortic Atherosclerosis (ICD10-I70.0). Electronically Signed   By: Morgane  Naveau M.D.   On: 06/25/2023 02:18       Treatments: surgery:  06/28/2023 Patient:  Clotilda Radigan Pre-Op Dx: 3V CAD HTN DM ESRD   Post-op Dx:  same Procedure: CABG X 3.  LIMA LAD, RSVG distal RCA, OM   Endoscopic greater saphenous vein harvest on the right by Dr. Shyrl on   06/28/2023.   Surgeon and Role:      * Lightfoot, Linnie KIDD, MD - Primary    DEWAINE MICAEL Cera , PA-C - assisting  Discharge Exam: Blood pressure 103/65, pulse 83, temperature 98.4 F (36.9 C), temperature source Oral, resp. rate 14, height 6' 1 (1.854 m), weight 97.5 kg, SpO2 91%.   Cardiovascular: RRR Pulmonary: Slightly diminished bibasilar breath sounds Abdomen: Soft, non tender, bowel sounds present. Extremities: Mild bilateral lower extremity edema. RUE with bruit and thrill, aneursymal Wounds: Sternal wound is clean and dry.  No erythema or signs of infection. RLE dressing removed and wound is clean and dry. Neurologic: Moving all  extremities   Discharge Medications:  The patient has been discharged on:   1.Beta Blocker:  Yes [ x  ]  No   [   ]                              If No, reason:  2.Ace Inhibitor/ARB: Yes [   ]                                     No  [  x  ]                                     If No, reason:CKD  3.Statin:   Yes [  x ]                  No  [   ]                  If No, reason:  4.Ecasa:  Yes  [  x ]                  No   [   ]                  If No, reason:  Patient had ACS upon admission:Yes  Plavix /P2Y12 inhibitor: Yes [  x ]                                      No  [   ]     Discharge Instructions     Amb Referral to Cardiac Rehabilitation   Complete by: As directed    Diagnosis: CABG   CABG X ___: 3   After initial evaluation and assessments completed: Virtual Based Care may be provided alone or in conjunction with Phase 2 Cardiac Rehab based on patient barriers.: Yes   Intensive Cardiac Rehabilitation (ICR) [facility] location only OR Traditional Cardiac Rehabilitation (TCR) *If criteria for ICR are not met will enroll in TCR [facility] only): Yes   Discharge patient   Complete by: As directed    Discharge disposition: 01-Home or Self Care   Discharge patient date: 07/08/2023      Allergies as of 07/08/2023       Reactions   Imdur  [isosorbide  Nitrate] Other (See Comments)   Endorsed headache in the past - willing to retry        Medication List     STOP taking these medications    amLODipine  5 MG tablet Commonly known as: NORVASC    nitroGLYCERIN  0.4 MG SL tablet Commonly known as: NITROSTAT        TAKE these medications    alum & mag hydroxide-simeth 200-200-20 MG/5ML suspension Commonly known as: MAALOX/MYLANTA Take 30 mLs by mouth every 6 (six) hours as needed for indigestion or heartburn.   aspirin  EC 81 MG tablet Take 1 tablet (81 mg total) by mouth daily. Swallow whole.   atorvastatin  80 MG tablet Commonly known as:  LIPITOR  Take 1 tablet (80 mg total) by mouth daily.   calcium  acetate 667 MG capsule Commonly known as: PHOSLO  Take 3 capsules (2,001 mg total) by mouth with breakfast, with lunch, and with evening meal.   carvedilol  12.5 MG tablet Commonly known as: COREG  Take 1 tablet (12.5 mg total) by mouth 2 (  two) times daily with a meal. What changed:  medication strength how much to take   clopidogrel  75 MG tablet Commonly known as: Plavix  Take 1 tablet (75 mg total) by mouth daily.   clopidogrel  75 MG tablet Commonly known as: PLAVIX  Take 1 tablet (75 mg total) by mouth daily.   famotidine  20 MG tablet Commonly known as: PEPCID  Take 20 mg by mouth daily.   ferric citrate  1 GM 210 MG(Fe) tablet Commonly known as: Auryxia  Take 2 tablets (420 mg total) by mouth daily. What changed: when to take this   gabapentin  100 MG capsule Commonly known as: Neurontin  Take 1 capsule (100 mg total) by mouth 2 (two) times daily as needed. What changed:  when to take this reasons to take this   multivitamin Tabs tablet Take 1 tablet by mouth daily.   olmesartan  20 MG tablet Commonly known as: BENICAR  Take 1 tablet (20 mg total) by mouth daily.   oxyCODONE  5 MG immediate release tablet Commonly known as: Oxy IR/ROXICODONE  Take 1 tablet (5 mg total) by mouth every 6 (six) hours as needed for severe pain (pain score 7-10).   pantoprazole  20 MG tablet Commonly known as: PROTONIX  Take 20 mg by mouth daily.        Follow-up Information     Lightfoot, Linnie KIDD, MD Follow up.   Specialty: Cardiothoracic Surgery Why: Please see discharge paperwork for details of follow-up appointment with Dr. Shyrl.  Your first appointment will be a virtual appointment which she will call you.  Subsequent appointments will be in person. Contact information: [address] [place] [address] [phone]         Jeffrie Oneil BROCKS, MD Follow up.   Specialty: Cardiology Why: Please see  discharge paperwork for details of follow-up appointment with cardiology.  Your first appointment may be with a physician assistant or nurse practitioner. Contact information: [address] [place] [address] [phone]         [facility] [facility] [employer] Follow up.   Why: Home 02 and rolling walker arranged- RW and portable 02 to be delivered to room prior to discharge Daron will deliver 02 concentrator to home post discharge) Contact information: [address] [place] [address] [phone]         [facility] Follow up.   Why: HHRN/PT/OT/aide arranged- they will contact you to schedule Contact information: [address] [place], [place] [address]  Phone: [phone]                Signed:  Lemond FORBES Cera, PA-C 07/10/2023, 9:44 AM

## 2023-06-29 NOTE — Progress Notes (Signed)
 NAME:  Corey Park, MRN:  [id number], DOB:  February 09, 1971, LOS: 4 ADMISSION DATE:  06/25/2023 CONSULTATION DATE:  06/28/2023 REFERRING MD:  Shyrl - TCTS CHIEF COMPLAINT:  Postoperative vent management   History of Present Illness:  Corey Park is seen in consultation at the request of Dr. Shyrl (TCTS) for management of mechanical ventilation and hemodynamics post-CABG x 22.  53 year old man who presented to [facility] 12/30 for CP x 3-4 days. PMHx significant for HTN, CAD (3-vessel disease), CHF (Echo 05/2023 EF 40-45%, +RWMAs, G2DD), dilated cardiomyopathy, T2DM, ESRD on HD (MWF via RUE AVF), legal blindness.  Found to have NSTEMI on admission. Underwent LHC 12/30 demonstrating severe 3-vessel CAD and CABG was recommended. TCTS consulted and underwent CABG x 3 (LIMA to LAD, RSVG to distal RCA, OM) with Dr. Shyrl 1/2. Intraoperative course was uncomplicated. EBL , received 1U Plt. X-clamp time , pump time .  PCCM consulted for postoperative ventilator management.  Pertinent Medical History:   Past Medical History:  Diagnosis Date   Allergy, unspecified, initial encounter 08/25/2019   Asthma    as a child   Cellulitis, perineum 11/26/2019   CHF (congestive heart failure) (HCC) 2021   Complication of vascular dialysis catheter 08/25/2019   COVID-19 virus infection 07/27/2019   Diabetes mellitus without complication (HCC)    type 2   Dilated cardiomyopathy (HCC) 07/03/2018   ESRD on hemodialysis (HCC) 06/17/2018   MWF at [facility]   Fe deficiency anemia 12/23/2018   Hypertension    Legally blind    B/L   Pneumonia 2021   Type 2 diabetes mellitus with diabetic peripheral angiopathy without gangrene (HCC) 08/25/2019   Unspecified protein-calorie malnutrition (HCC) 08/25/2019   Significant Hospital Events: Including procedures, antibiotic start and stop dates in addition to other pertinent events   1/2 - CABG x 3. CCM consulted for MV/hemodynamics. Extubated.  Interim  History / Subjective:  Extubated POD#0 per protocol K > 7.0 overnight Nephro consulted, shifted overnight and HD completed early AM 3.1L removed 1/1-1/2, additional 2L removed 1/3AM Hypotensive after HD requiring albumin  resuscitation (presume 2/2 HD) Mild splinting with deep breaths 2/2 pain A-line, Foley and wires out today per TCTS  Objective:  Blood pressure (!) 120/58, pulse (!) 110, temperature 99.3 F (37.4 C), resp. rate (!) 25, height 6' 1 (1.854 m), weight 104.8 kg, SpO2 98%. CVP:  [5 mmHg-14 mmHg] 8 mmHg CO:  [5.7 L/min-8.6 L/min] 8.2 L/min CI:  [2.5 L/min/m2-3.9 L/min/m2] 3.9 L/min/m2  Vent Mode: CPAP;PSV FiO2 (%):  [40 %-50 %] 45 % Set Rate:  [4 bmp-16 bmp] 4 bmp Vt Set:  [630 mL] 630 mL PEEP:  [5 cmH20] 5 cmH20 Pressure Support:  [10 cmH20] 10 cmH20   Intake/Output Summary (Last 24 hours) at 06/29/2023 0743 Last data filed at 06/29/2023 0700 Gross per 24 hour  Intake 4554.12 ml  Output 3395 ml  Net 1159.12 ml   Filed Weights   06/27/23 1810 06/28/23 0646 06/29/23 0600  Weight: 103.5 kg 103.5 kg 104.8 kg   Physical Examination: General: Acutely ill-appearing middle-aged man in NAD. Pleasant and conversant. HEENT: [place]/AT, anicteric sclera, moist mucous membranes. Of note, patient is legally blind. Neuro: Awake, oriented x 4. Responds to verbal stimuli. Following commands consistently. Moves all 4 extremities spontaneously. Strength 4-5/5 in all 4 extremities.  CV: RRR, no m/g/r. Midline sternotomy with dressing in place, clean/dry/intact without strikethrough. CT with serosanguinous output. PULM: Breathing shallow and mildly tachypneic on 2LNC. Lung fields diminished at bases bilaterally, fair respiratory  effort but +splinting due to pain. GI: Soft, nontender, moderately distended. Hypoactive bowel sounds. Extremities: No LE edema noted. RUE AVF +thrill. Skin: Warm/dry, no rashes.  Resolved Hospital Problem List:  Postoperative vent management,  resolved  Assessment & Plan:  Corey Park is seen in consultation at the request of Dr. Shyrl (TCTS) for management of mechanical ventilation and hemodynamics post-CABG x 57.  53 year old man who presented to [facility] 12/30 for CP x 3-4 days. PMHx significant for HTN, CAD (3-vessel disease), CHF (Echo 05/2023 EF 40-45%, +RWMAs, G2DD), dilated cardiomyopathy, T2DM, ESRD on HD (MWF via RUE AVF). Now s/p CABG 1/2.  Bilateral pleural effusions, small Respiratory insufficiency s/p surgery Suspect primarily splinting due to pain vs. mild volume overload. - Supplemental O2 support - Wean O2 for sat > 90% - HD for volume removal as tolerated, will have to be a slow/gentle process due to labile BP - Pulmonary hygiene (IS, flutter as needed) - Intermittent CXR  S/p 3-vessel CABG (LIMA to LAD, RSVG to distal RCA, OM) and RSVG CAD CHF Dilated CM - POD#1 from CABG x 3 - Postoperative management per TCTS - ASA/statin - CT management per protocol - A-line, Foley, wires out today - Multimodal pain management with oxycodone , morphine , tramadol , APAP  ESRD on HD MWF - Nephro following for continued HD - Trend BMP - Replete electrolytes as indicated - Monitor I&Os - Avoid nephrotoxic agents as able  T2DM - Insulin  gtt postoperatively - Transition from insulin  gtt to SSI + basal as able - CBGs per EndoTool  Best Practice: (right click and Reselect all SmartList Selections daily)   Diet/type: NPO, ADAT post-extubation DVT prophylaxis: SCDs GI prophylaxis: PPI Lines: Central line and Arterial Line Foley:  Yes, and it is still needed Code Status:  full code Last date of multidisciplinary goals of care discussion [Per Primary Team]  Critical care time:    The patient is critically ill with multiple organ system failure and requires high complexity decision making for assessment and support, frequent evaluation and titration of therapies, advanced monitoring, review of radiographic studies  and interpretation of complex data.   Critical Care Time devoted to patient care services, exclusive of separately billable procedures, described in this note is 37 minutes.  Corean CHRISTELLA Archana Eckman, PA-C [facility] 06/29/23 7:43 AM  Please see [email or web address] for pager details.  From 7A-7P if no response, please call [phone] After hours, please call ELink [phone]

## 2023-06-29 NOTE — Progress Notes (Addendum)
 [address]       [place] [address]             [phone]      1 Day Post-Op Procedure(s) (LRB): CORONARY ARTERY BYPASS GRAFTING TIMES THREE USING LEFT INTERNAL MAMMARY ARTERY AND RIGHT GREAT SAPHENOUS VEIN HARVESTED ENDOSCOPICALLY (N/A) TRANSESOPHAGEAL ECHOCARDIOGRAM (TEE) (N/A) Subjective: The patient states he is having a lot of sternal pain this AM and needed morphine  all night.   Objective: Vital signs in last 24 hours: Temp:  [97.7 F (36.5 C)-99.3 F (37.4 C)] 99.3 F (37.4 C) (01/03 0630) Pulse Rate:  [63-110] 110 (01/03 0630) Cardiac Rhythm: Normal sinus rhythm (01/02 2000) Resp:  [8-41] 25 (01/03 0630) BP: (103-132)/(57-84) 120/58 (01/03 0545) SpO2:  [84 %-100 %] 98 % (01/03 0630) Arterial Line BP: (98-153)/(47-83) 115/58 (01/03 0630) FiO2 (%):  [40 %-50 %] 45 % (01/02 2247) Weight:  [104.8 kg] 104.8 kg (01/03 0600)  Hemodynamic parameters for last 24 hours: CVP:  [5 mmHg-14 mmHg] 8 mmHg CO:  [5.7 L/min-8.6 L/min] 8.2 L/min CI:  [2.5 L/min/m2-3.9 L/min/m2] 3.9 L/min/m2  Intake/Output from previous day: 01/02 0701 - 01/03 0700 In: 4554.1 [I.V.:2899.2; Blood:738; IV Piggyback:916.9] Out: 3395 [Urine:40; Blood:1000; Chest Tube:355] Intake/Output this shift: No intake/output data recorded.  General appearance: cooperative, no distress, and drowsy Neurologic: intact Heart: sinus tachycardia, no murmur Lungs: diminished bibasilar breath sounds Abdomen: hypoactive bowel sounds, nontender, no distension Extremities: edema trace-1+ Wound: Clean and dry without sign of infection  Lab Results: Recent Labs    06/28/23 1909 06/28/23 2250 06/28/23 2349 06/29/23 0544  WBC 11.7*  --   --  10.4  HGB 9.2*   < > 8.8* 9.0*  HCT 27.9*   < > 26.0* 27.0*  PLT 127*  --   --  142*   < > = values in this interval not displayed.   BMET:  Recent Labs    06/28/23 2351 06/29/23 0544  NA 131* 135  K 6.5* 3.6  CL 97* 96*  CO2 22 25  GLUCOSE  125* 111*  BUN 36* 13  CREATININE 12.45* 5.14*  CALCIUM  8.6* 8.7*    PT/INR:  Recent Labs    06/28/23 1254  LABPROT 17.8*  INR 1.4*   ABG    Component Value Date/Time   PHART 7.345 (L) 06/28/2023 2349   HCO3 25.6 06/28/2023 2349   TCO2 27 06/28/2023 2349   ACIDBASEDEF 2.0 06/28/2023 2250   O2SAT 97 06/28/2023 2349   CBG (last 3)  Recent Labs    06/29/23 0504 06/29/23 0604 06/29/23 0659  GLUCAP 104* 115* 106*    Assessment/Plan: S/P Procedure(s) (LRB): CORONARY ARTERY BYPASS GRAFTING TIMES THREE USING LEFT INTERNAL MAMMARY ARTERY AND RIGHT GREAT SAPHENOUS VEIN HARVESTED ENDOSCOPICALLY (N/A) TRANSESOPHAGEAL ECHOCARDIOGRAM (TEE) (N/A)  Neuro: Patient complaining of pain this AM but has only received 2 doses of Oxycodone  and has only been getting morphine . Continue current pain regimen.   CV: ST, HR 103 this AM. VVI paced at 50. Soft BP. Cuff SBP 101, arterial SBP 94. CI 3.9. Off drips. D/C arterial line. Can likely d/c EPW.   Pulm: Extubated yesterday evening. Saturating well on 2L [place]. CXR with bibasilar atelectasis and small left pleural effusion. Encourage IS and ambulation. CT output 355cc/24hrs. Leave in place for now.   GI: No nausea or vomiting, advance diet as tolerated  Endo: T2DM, Preop A1C 5.8. Not on home meds. CBGs controlled on inuslin drip, transition to SSI  Renal: Cr 5.14. ESRD on HD MWF. Hyperkalemia postop, was given insulin  bolus, bicarb and 20g of lokelma . Required urgent dialysis for K 7.2. Now K 3.6. +15lbs from preop. Nephrology following.   Expected postop ABLA: H/H 9/27. Not clinically significant at this time. Reactive thrombocytopenia, plt 142,000. Monitor.   DVT Prophylaxis: Start Lovenox   Dispo: Continue ICU care   LOS: 4 days    Con JAYSON Helm, PA-C 06/29/2023   Agree with above POD 1 progression  Rockell Faulks O Lamika Connolly

## 2023-06-29 NOTE — Progress Notes (Signed)
 Patient ID: Yuuki Skeens, male   DOB: 03/03/1971, 53 y.o.   MRN: [id number] S: Pt tolerated CABG well but developed hyperkalemia last night.  K up to 7.2 and required urgent dialysis early this morning.  O:BP 108/65   Pulse 96   Temp 99 F (37.2 C)   Resp 20   Ht 6' 1 (1.854 m)   Wt 104.8 kg   SpO2 99%   BMI 30.48 kg/m   Intake/Output Summary (Last 24 hours) at 06/29/2023 0937 Last data filed at 06/29/2023 0800 Gross per 24 hour  Intake 4834.39 ml  Output 3395 ml  Net 1439.39 ml   Intake/Output: I/O last 3 completed shifts: In: 4554.1 [I.V.:2899.2; Blood:738; IV Piggyback:916.9] Out: 3395 [Urine:40; Other:2000; Blood:1000; Chest Tube:355]  Intake/Output this shift:  Total I/O In: 280.3 [I.V.:30.3; IV Piggyback:250] Out: 0  Weight change: 1.281 kg Gen:NAD CVS: RRR Resp:CTA Abd:+BS, soft, NT/ND Ext: no edema, RUE AVF +T/B  Recent Labs  Lab 06/25/23 0203 06/25/23 0208 06/25/23 0448 06/26/23 0416 06/27/23 1535 06/28/23 0308 06/28/23 0755 06/28/23 0935 06/28/23 1009 06/28/23 1039 06/28/23 1051 06/28/23 1124 06/28/23 1152 06/28/23 1302 06/28/23 1755 06/28/23 1908 06/28/23 1909 06/28/23 2250 06/28/23 2349 06/28/23 2351 06/29/23 0544 06/29/23 0812  NA 140   < > 140 137 139 135   < > 133*   < > 130*   < > 137 135   < > 134* 137 133* 134* 133* 131* 135  --   K 4.5   < > 4.8 4.7 4.8 4.3   < > 6.3*   < > 6.8*   < > 6.3* 5.8*   < > 6.7* 5.9* 7.2* 6.5* 6.8* 6.5* 3.6 4.4  CL 97*   < > 98 98 99 95*   < > 99  --  96*  --  98 100  --   --   --  99  --   --  97* 96*  --   CO2 24  --  25 25 25 27   --   --   --   --   --   --   --   --   --   --  23  --   --  22 25  --   GLUCOSE 156*   < > 175* 96 66* 79   < > 111*  --  106*  --  106* 119*  --   --   --  107*  --   --  125* 111*  --   BUN 44*   < > 45* 48* 56* 31*   < > 31*  --  33*  --  34* 34*  --   --   --  37*  --   --  36* 13  --   CREATININE 14.45*   < > 14.50* 16.34* 17.84* 12.30*   < > 13.10*  --  12.00*  --  11.40*  10.40*  --   --   --  12.40*  --   --  12.45* 5.14*  --   ALBUMIN  3.6  --   --   --  3.0*  --   --   --   --   --   --   --   --   --   --   --   --   --   --   --   --   --   CALCIUM  9.3  --  8.7* 8.8* 9.5  8.8*  --   --   --   --   --   --   --   --   --   --  8.8*  --   --  8.6* 8.7*  --   PHOS  --   --  7.5*  --  5.9*  --   --   --   --   --   --   --   --   --   --   --   --   --   --   --   --   --   AST 25  --   --   --   --   --   --   --   --   --   --   --   --   --   --   --   --   --   --   --   --   --   ALT 15  --   --   --   --   --   --   --   --   --   --   --   --   --   --   --   --   --   --   --   --   --    < > = values in this interval not displayed.   Liver Function Tests: Recent Labs  Lab 06/25/23 0203 06/27/23 1535  AST 25  --   ALT 15  --   ALKPHOS 79  --   BILITOT 0.7  --   PROT 8.3*  --   ALBUMIN  3.6 3.0*   No results for input(s): LIPASE, AMYLASE in the last 168 hours. No results for input(s): AMMONIA in the last 168 hours. CBC: Recent Labs  Lab 06/25/23 0203 06/25/23 0208 06/27/23 0405 06/28/23 0308 06/28/23 0755 06/28/23 1254 06/28/23 1302 06/28/23 1909 06/28/23 2250 06/28/23 2349 06/29/23 0544  WBC 9.6   < > 8.3 6.8  --  9.4  --  11.7*  --   --  10.4  NEUTROABS 6.6  --   --   --   --   --   --  9.8*  --   --   --   HGB 12.8*   < > 10.2* 10.3*   < > 8.1*   < > 9.2* 8.5* 8.8* 9.0*  HCT 38.7*   < > 30.1* 30.5*   < > 23.9*   < > 27.9* 25.0* 26.0* 27.0*  MCV 97.2   < > 94.7 95.3  --  97.2  --  97.9  --   --  97.5  PLT 177   < > 140* 130*   < > 89*  --  127*  --   --  142*   < > = values in this interval not displayed.   Cardiac Enzymes: No results for input(s): CKTOTAL, CKMB, CKMBINDEX, TROPONINI in the last 168 hours. CBG: Recent Labs  Lab 06/29/23 0403 06/29/23 0504 06/29/23 0604 06/29/23 0659 06/29/23 0809  GLUCAP 98 104* 115* 106* 105*    Iron  Studies: No results for input(s): IRON , TIBC, TRANSFERRIN,  FERRITIN in the last 72 hours. Studies/Results: DG CHEST PORT 1 VIEW Result Date: 06/29/2023 CLINICAL DATA:  Hypoxia EXAM: PORTABLE CHEST 1 VIEW COMPARISON:  Film from earlier in the same day. FINDINGS: Endotracheal tube and gastric catheter have been removed.  Right jugular central line is again noted and stable. Heart is enlarged. Postsurgical changes are seen. Bibasilar atelectasis is noted with a small right-sided pleural effusion. IMPRESSION: Bibasilar atelectasis and small pleural effusion. Electronically Signed   By: Oneil Devonshire M.D.   On: 06/29/2023 00:05   DG Chest Port 1 View Result Date: 06/28/2023 CLINICAL DATA:  Status post CABG EXAM: PORTABLE CHEST 1 VIEW COMPARISON:  06/25/2023 FINDINGS: Single frontal view of the chest demonstrates endotracheal tube overlying tracheal air column tip at level of thoracic inlet. Enteric catheter passes below diaphragm, tip and side port project over the gastric fundus. Right internal jugular catheter tip overlies superior vena cava. Median sternotomy wires are noted. The cardiac silhouette is enlarged. Lung volumes are diminished, with crowding of the central vasculature. Retrocardiac atelectasis. Small bilateral pleural effusions with blunting of the costophrenic angles. No evidence of pneumothorax. IMPRESSION: 1. Postsurgical changes from CABG.  Support devices as above. 2. Hypoventilatory changes, with small bilateral pleural effusions. Electronically Signed   By: Ozell Daring M.D.   On: 06/28/2023 15:44   ECHO INTRAOPERATIVE TEE Result Date: 06/28/2023  *INTRAOPERATIVE TRANSESOPHAGEAL REPORT *  Patient Name:   AHMAN DUGDALE Date of Exam: 06/28/2023 Medical Rec #:  [id number]       Height:       73.0 in Accession #:    [id number]      Weight:       228.2 lb Date of Birth:  03/16/1971       BSA:          2.28 m Patient Age:    52 years        BP:           125/74 mmHg Patient Gender: M               HR:           89 bpm. Exam Location:  Anesthesiology  Transesophogeal exam was perform intraoperatively during surgical procedure. Patient was closely monitored under general anesthesia during the entirety of examination. Indications:     CAD Native Vessel i25.10 Sonographer:     Damien Senior RDCS Performing Phys: [id number] LINNIE KIDD LIGHTFOOT Diagnosing Phys: Norleen Pope MD Complications: No known complications during this procedure. POST-OP IMPRESSIONS _ Left Ventricle: has mildly reduced systolic function, with an ejection fraction of 40%. The cavity size was normal. The wall motion is abnormal with regional variation. _ Right Ventricle: The right ventricle appears unchanged from pre-bypass. _ Aorta: The aorta appears unchanged from pre-bypass. _ Left Atrial Appendage: The left atrial appendage appears unchanged from pre-bypass. _ Aortic Valve: The aortic valve appears unchanged from pre-bypass. _ Mitral Valve: The mitral valve appears unchanged from pre-bypass. _ Tricuspid Valve: The tricuspid valve appears unchanged from pre-bypass. _ Pulmonic Valve: The pulmonic valve appears unchanged from pre-bypass. _ Interatrial Septum: The interatrial septum appears unchanged from pre-bypass. PRE-OP FINDINGS  Left Ventricle: The left ventricle has severely reduced systolic function, with an ejection fraction of 20-30%. The cavity size was normal. Left ventrical global hypokinesis without regional wall motion abnormalities. There is mild concentric left ventricular hypertrophy. Right Ventricle: The right ventricle has mildly reduced systolic function. The cavity was normal. There is no increase in right ventricular wall thickness. Left Atrium: Left atrial size was normal in size. No left atrial/left atrial appendage thrombus was detected. The left atrial appendage is well visualized and there is no evidence of thrombus present. Right Atrium: Right atrial size was normal in size.  Interatrial Septum: No atrial level shunt detected by color flow Doppler. There is no evidence of  a patent foramen ovale. Pericardium: There is no evidence of pericardial effusion. Mitral Valve: The mitral valve is normal in structure. Mitral valve regurgitation is mild by color flow Doppler. There is no evidence of mitral valve vegetation. Tricuspid Valve: The tricuspid valve was normal in structure. Tricuspid valve regurgitation is trivial by color flow Doppler. Aortic Valve: The aortic valve is normal in structure. Aortic valve regurgitation was not visualized by color flow Doppler. There is no stenosis of the aortic valve. Pulmonic Valve: The pulmonic valve was normal in structure. Pulmonic valve regurgitation is trivial by color flow Doppler. Aorta: There is evidence of plaque in the aortic root, descending aorta and ascending aorta; Grade II, measuring 2-48mm in size. Shunts: There is no evidence of an atrial septal defect.  Norleen Pope MD Electronically signed by Norleen Pope MD Signature Date/Time: 06/28/2023/2:06:54 PM    Final    VAS US  DOPPLER PRE CABG Result Date: 06/28/2023 PREOPERATIVE VASCULAR EVALUATION Patient Name:  TRYSTYN DOLLEY  Date of Exam:   06/27/2023 Medical Rec #: [id number]        Accession #:    [id number] Date of Birth: [birth year]        Patient Gender: M Patient Age:   89 years Exam Location:  [facility] Procedure:      VAS US  DOPPLER PRE CABG Referring Phys: HARRELL LIGHTFOOT --------------------------------------------------------------------------------  Indications:      Pre-CABG. Risk Factors:     Hypertension, hyperlipidemia, Diabetes, no history of smoking,                   prior MI. Other Factors:    ESRD (HD), CHF. Limitations:      poor tissue/ultrasound interface, patient agitation/movement Comparison Study: No previous exams Performing Technologist: Jody Hill RVT, RDMS  Examination Guidelines: A complete evaluation includes B-mode imaging, spectral Doppler, color Doppler, and power Doppler as needed of all accessible portions of each vessel. Bilateral testing  is considered an integral part of a complete examination. Limited examinations for reoccurring indications may be performed as noted.  Right Carotid Findings: +----------+--------+--------+--------+--------------------+------------------+           PSV cm/sEDV cm/sStenosisDescribe            Comments           +----------+--------+--------+--------+--------------------+------------------+ CCA Prox  79      17              calcific and diffuseintimal thickening +----------+--------+--------+--------+--------------------+------------------+ CCA Distal59      9               calcific            intimal thickening +----------+--------+--------+--------+--------------------+------------------+ ICA Prox  54      17      1-39%   calcific                               +----------+--------+--------+--------+--------------------+------------------+ ICA Distal45      14                                                     +----------+--------+--------+--------+--------------------+------------------+ ECA       55  0               calcific                               +----------+--------+--------+--------+--------------------+------------------+ +----------+--------+-------+--------+------------+           PSV cm/sEDV cmsDescribeArm Pressure +----------+--------+-------+--------+------------+ Subclavian198     52     Stenotic             +----------+--------+-------+--------+------------+ +---------+--------+--+--------+-+----------+ VertebralPSV cm/s31EDV cm/s5Retrograde +---------+--------+--+--------+-+----------+ Left Carotid Findings: +----------+--------+--------+--------+--------+-------------------+           PSV cm/sEDV cm/sStenosisDescribeComments            +----------+--------+--------+--------+--------+-------------------+ CCA Prox  85      7               calcific                     +----------+--------+--------+--------+--------+-------------------+ CCA Distal108     12              calcificintimal thickening  +----------+--------+--------+--------+--------+-------------------+ ICA Prox  62      16      1-39%   calcific                    +----------+--------+--------+--------+--------+-------------------+ ICA Distal61      17                                          +----------+--------+--------+--------+--------+-------------------+ ECA       43      0                       not well visualized +----------+--------+--------+--------+--------+-------------------+  +----------+--------+--------+----------------+------------+ SubclavianPSV cm/sEDV cm/sDescribe        Arm Pressure +----------+--------+--------+----------------+------------+           97              Multiphasic, WNL             +----------+--------+--------+----------------+------------+ +---------+--------+--+--------+--+---------+ VertebralPSV cm/s46EDV cm/s15Antegrade +---------+--------+--+--------+--+---------+   Left Doppler Findings: +--------+---------+ Site    Doppler   +--------+---------+ Brachialtriphasic +--------+---------+ Radial  triphasic +--------+---------+ Ulnar   biphasic  +--------+---------+  Technologist Notes: RUE -HD access.  Summary: Right Carotid: Velocities in the right ICA are consistent with a 1-39% stenosis. Left Carotid: Velocities in the left ICA are consistent with a 1-39% stenosis. Vertebrals:  Left vertebral artery demonstrates antegrade flow. Right vertebral              artery demonstrates retrograde flow. Subclavians: Right subclavian artery was stenotic. Normal flow hemodynamics were              seen in the left subclavian artery. Right ABI: PTA - biphasic, ATA - monophasic, PeroA - absent, DPA - monophasic. Left ABI: PTA & ATA - biphasic, PeroA - absent, DPA - monophasic. Right Upper Extremity: Not assessed - HD access. Left  Upper Extremity: Doppler waveforms remain within normal limits with left radial compression. Doppler waveform obliterates with left ulnar compression.  Electronically signed by Penne Colorado MD on 06/28/2023 at 8:11:47 AM.    Final     acetaminophen   1,000 mg Oral Q6H   Or   acetaminophen  (TYLENOL ) oral liquid 160 mg/5 mL  1,000 mg Per Tube Q6H   aspirin  EC  325 mg Oral Daily   Or   aspirin   324 mg Per Tube Daily   atorvastatin   80 mg Oral Daily   bisacodyl   10 mg Oral Daily   Or   bisacodyl   10 mg Rectal Daily   calcium  acetate  2,001 mg Oral TID with meals   Chlorhexidine  Gluconate Cloth  6 each Topical Q0600   docusate sodium   200 mg Oral Daily   metoCLOPramide  (REGLAN ) injection  10 mg Intravenous Q6H   metoprolol  tartrate  12.5 mg Oral BID   Or   metoprolol  tartrate  12.5 mg Per Tube BID   [START ON 06/30/2023] pantoprazole   40 mg Oral Daily   pantoprazole  (PROTONIX ) IV  40 mg Intravenous QHS   sodium chloride  flush  3 mL Intravenous Q12H    BMET    Component Value Date/Time   NA 135 06/29/2023 0544   NA 141 01/16/2019 1517   K 4.4 06/29/2023 0812   CL 96 (L) 06/29/2023 0544   CO2 25 06/29/2023 0544   GLUCOSE 111 (H) 06/29/2023 0544   BUN 13 06/29/2023 0544   BUN 62 (H) 01/16/2019 1517   CREATININE 5.14 (H) 06/29/2023 0544   CALCIUM  8.7 (L) 06/29/2023 0544   GFRNONAA 13 (L) 06/29/2023 0544   GFRAA  09/12/2022 0330    SPECIMEN HEMOLYZED. HEMOLYSIS MAY AFFECT INTEGRITY OF RESULTS.   CBC    Component Value Date/Time   WBC 10.4 06/29/2023 0544   RBC 2.77 (L) 06/29/2023 0544   HGB 9.0 (L) 06/29/2023 0544   HGB 9.5 (L) 01/16/2019 1517   HCT 27.0 (L) 06/29/2023 0544   HCT 29.1 (L) 01/16/2019 1517   PLT 142 (L) 06/29/2023 0544   PLT 189 01/16/2019 1517   MCV 97.5 06/29/2023 0544   MCV 92 01/16/2019 1517   MCH 32.5 06/29/2023 0544   MCHC 33.3 06/29/2023 0544   RDW 16.7 (H) 06/29/2023 0544   RDW 14.9 01/16/2019 1517   LYMPHSABS 0.6 (L) 06/28/2023 1909   LYMPHSABS 1.0  01/16/2019 1517   MONOABS 1.1 (H) 06/28/2023 1909   EOSABS 0.1 06/28/2023 1909   EOSABS 0.2 01/16/2019 1517   BASOSABS 0.0 06/28/2023 1909   BASOSABS 0.0 01/16/2019 1517    Dialysis Orders: Outpatient dialysis orders: [facility] MWF 180NRe 4 hours BFR 450 DFR Auto 1.5 EDW 102.3kg 2K 2Ca AVF 15g No heparin  Mircera 50mcg IV q 2 weeks- last dose 06/19/23 Calcitriol  1.5 mcg PO q HD   Assessment/Plan:  NSTEMI: 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 On IV heparin  and nitroglycerin . 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  Had hyperkalemia post operatively but improved after urgent dialysis early this morning.  Currently resting comfortably but required pain meds throughout the night.  Management per admitting team/cardiology  ESRD:  Typically on MWF schedule, off schedule this week due to holiday/missed HD on Sunday. HD delayed today due to nitroglycerin  drip as above. After discussing with PMD and nephrology attending, plan is to stop nitroglycerin  drip during dialysis then resume when he gets back to the floor. Respiratory status stable, will follow labs over the weekend, but may require another session of HD tomorrow.  Hypertension/volume: BP elevated, may be partially due to stress/volume. Monitor post HD. Continue current medications as per cardiology. Not in respiratory distress, no edema on exam. Will try to get more accurate standing weight when able.   Anemia: Hgb 9 following surgery. ESA recently dosed, continue to monitor  Metabolic  bone disease: Calcium  controlled, phos elevated. Continue VDRA and binders  Nutrition:  Alb 3.6, will need renal diet/protein supplements once tolerating PO  Fairy RONAL Sellar, MD [facility] [phone]

## 2023-06-30 ENCOUNTER — Inpatient Hospital Stay (HOSPITAL_COMMUNITY): Payer: Medicare Other

## 2023-06-30 DIAGNOSIS — I5021 Acute systolic (congestive) heart failure: Secondary | ICD-10-CM

## 2023-06-30 DIAGNOSIS — J90 Pleural effusion, not elsewhere classified: Secondary | ICD-10-CM | POA: Diagnosis not present

## 2023-06-30 DIAGNOSIS — I214 Non-ST elevation (NSTEMI) myocardial infarction: Secondary | ICD-10-CM | POA: Diagnosis not present

## 2023-06-30 DIAGNOSIS — E8779 Other fluid overload: Secondary | ICD-10-CM | POA: Diagnosis not present

## 2023-06-30 LAB — RENAL FUNCTION PANEL
Albumin: 3.3 g/dL — ABNORMAL LOW (ref 3.5–5.0)
Anion gap: 14 (ref 5–15)
BUN: 31 mg/dL — ABNORMAL HIGH (ref 6–20)
CO2: 26 mmol/L (ref 22–32)
Calcium: 8.5 mg/dL — ABNORMAL LOW (ref 8.9–10.3)
Chloride: 95 mmol/L — ABNORMAL LOW (ref 98–111)
Creatinine, Ser: 10.58 mg/dL — ABNORMAL HIGH (ref 0.61–1.24)
GFR, Estimated: 5 mL/min — ABNORMAL LOW (ref >60)
Glucose, Bld: 97 mg/dL (ref 70–99)
Phosphorus: 7.8 mg/dL — ABNORMAL HIGH (ref 2.5–4.6)
Potassium: 4.9 mmol/L (ref 3.5–5.1)
Sodium: 135 mmol/L (ref 135–145)

## 2023-06-30 LAB — GLUCOSE, CAPILLARY
Glucose-Capillary: 101 mg/dL — ABNORMAL HIGH (ref 70–99)
Glucose-Capillary: 115 mg/dL — ABNORMAL HIGH (ref 70–99)
Glucose-Capillary: 153 mg/dL — ABNORMAL HIGH (ref 70–99)
Glucose-Capillary: 124 mg/dL — ABNORMAL HIGH (ref 70–99)

## 2023-06-30 LAB — POTASSIUM
Potassium: 5 mmol/L (ref 3.5–5.1)
Potassium: 4.9 mmol/L (ref 3.5–5.1)

## 2023-06-30 MED ORDER — SODIUM CHLORIDE 0.9% FLUSH: 3.0000 mL | INTRAVENOUS | Status: DC | PRN

## 2023-06-30 MED ORDER — SODIUM CHLORIDE 0.9 % IV SOLN: 250.0000 mL | INTRAVENOUS | Status: AC | PRN

## 2023-06-30 MED ORDER — INSULIN GLARGINE-YFGN 100 UNIT/ML ~~LOC~~ SOLN
7.0000 [IU] | Freq: Two times a day (BID) | SUBCUTANEOUS | Status: DC
  Administered 2023-06-30 – 2023-07-02 (×5): 7 [IU] via SUBCUTANEOUS
  Filled 2023-06-30 (×8): qty 0.07

## 2023-06-30 MED ORDER — ~~LOC~~ CARDIAC SURGERY, PATIENT & FAMILY EDUCATION: Freq: Once | Status: AC

## 2023-06-30 MED ORDER — HEPARIN SODIUM (PORCINE) 5000 UNIT/ML IJ SOLN
5000.0000 [IU] | Freq: Three times a day (TID) | INTRAMUSCULAR | Status: DC
Start: 2023-06-30 — End: 2023-07-08
  Administered 2023-06-30 – 2023-07-08 (×23): 5000 [IU] via SUBCUTANEOUS
  Filled 2023-06-30 (×23): qty 1

## 2023-06-30 MED ORDER — SODIUM CHLORIDE 0.9% FLUSH
3.0000 mL | Freq: Two times a day (BID) | INTRAVENOUS | Status: DC
  Administered 2023-06-30 – 2023-07-07 (×13): 3 mL via INTRAVENOUS

## 2023-06-30 NOTE — Progress Notes (Signed)
      [address]       [place] [address]             [phone]                 2 Days Post-Op Procedure(s) (LRB): CORONARY ARTERY BYPASS GRAFTING TIMES THREE USING LEFT INTERNAL MAMMARY ARTERY AND RIGHT GREAT SAPHENOUS VEIN HARVESTED ENDOSCOPICALLY (N/A) TRANSESOPHAGEAL ECHOCARDIOGRAM (TEE) (N/A)   Events: No events _______________________________________________________________ Vitals: BP 119/75   Pulse 94   Temp 98.6 F (37 C) (Oral)   Resp (!) 31   Ht 6' 1 (1.854 m)   Wt 107 kg   SpO2 (!) 80%   BMI 31.12 kg/m  Filed Weights   06/28/23 0646 06/29/23 0600 06/30/23 0600  Weight: 103.5 kg 104.8 kg 107 kg     - Neuro: alert NAD  - Cardiovascular: sinus  Drips: none.      - Pulm: EWOB    ABG    Component Value Date/Time   PHART 7.345 (L) 06/28/2023 2349   PCO2ART 47.0 06/28/2023 2349   PO2ART 96 06/28/2023 2349   HCO3 25.6 06/28/2023 2349   TCO2 27 06/28/2023 2349   ACIDBASEDEF 2.0 06/28/2023 2250   O2SAT 97 06/28/2023 2349    - Abd: ND - Extremity: warm  .Intake/Output      01/03 0701 01/04 0700 01/04 0701 01/05 0700   P.Corey. 240    I.V. (mL/kg) 170.8 (1.6)    Blood     IV Piggyback 1000    Total Intake(mL/kg) 1410.8 (13.2)    Urine (mL/kg/hr) 0 (0)    Other     Blood     Chest Tube 90    Total Output 90    Net +1320.8            _______________________________________________________________ Labs:    Latest Ref Rng & Units 06/29/2023    5:18 PM 06/29/2023    5:44 AM 06/28/2023   11:49 PM  CBC  WBC 4.0 - 10.5 K/uL 10.3  10.4    Hemoglobin 13.0 - 17.0 g/dL 7.9  9.0  8.8   Hematocrit 39.0 - 52.0 % 24.7  27.0  26.0   Platelets 150 - 400 K/uL 113  142        Latest Ref Rng & Units 06/30/2023    5:13 AM 06/30/2023    1:03 AM 06/29/2023    5:18 PM  CMP  Glucose 70 - 99 mg/dL 97   866   BUN 6 - 20 mg/dL 31   25   Creatinine 9.38 - 1.24 mg/dL 89.41   1.41   Sodium 864 - 145 mmol/L 135   134   Potassium 3.5 - 5.1  mmol/L 3.5 - 5.1 mmol/L 4.9    4.9  5.0  4.8   Chloride 98 - 111 mmol/L 95   97   CO2 22 - 32 mmol/L 26   25   Calcium  8.9 - 10.3 mg/dL 8.5   8.2     CXR: stable  _______________________________________________________________  Assessment and Plan: POD 2 s/p CABG  Neuro: pain controlled CV: titrating meds Pulm: IS, ambulation Renal: on IHD GI: on diet Heme: stable ID: [email or web address] Endo: SSI today Dispo: floor   Corey Park Corey Park 06/30/2023 11:00 AM

## 2023-06-30 NOTE — Progress Notes (Signed)

## 2023-06-30 NOTE — Evaluation (Signed)
 Physical Therapy Evaluation Patient Details Name: Corey Park MRN: [id number] DOB: 07-24-1970 Today's Date: 06/30/2023  History of Present Illness  Pt is a 53 y/o male presenting on 06/25/23 with acute central chest pain and dyspnea. Pt with NSTEMI and underwent CABG x3 on 06/28/23. Had hyperkalemia post surgery and underwent urgent HD. 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  Clinical Impression  Pt admitted with above diagnosis. Pt from home and independent with son assisting intermittently. Pt received in recliner and educated on sternal precautions. Given that pt unable to see handout (which was left for his family), will need further teaching. Pt with delayed processing in part due to visual deficits. Expect that his is his baseline but no family present to verify. Pt needed mod A +2 for sit to stand. Pt ambulated in hallway with Elyn walker and 2 person assist for safety and navigation of AD. VSS on 4L O2. Patient will benefit from intensive inpatient follow up therapy, >3 hours/day.  Pt currently with functional limitations due to the deficits listed below (see PT Problem List). Pt will benefit from acute skilled PT to increase their independence and safety with mobility to allow discharge.           If plan is discharge home, recommend the following: A little help with walking and/or transfers;A lot of help with bathing/dressing/bathroom;Assistance with cooking/housework;Assist for transportation;Help with stairs or ramp for entrance   Can travel by private vehicle        Equipment Recommendations Rolling walker (2 wheels)  Recommendations for Other Services  Rehab consult    Functional Status Assessment Patient has had a recent decline in their functional status and demonstrates the ability to make significant improvements in function in a reasonable and predictable amount of time.     Precautions / Restrictions  Precautions Precautions: Sternal Precaution Booklet Issued: Yes (comment) Precaution Comments: verbally reviewed since pt cannot see and left handout for pt's family Restrictions Weight Bearing Restrictions Per Provider Order: Yes RUE Weight Bearing Per Provider Order: Non weight bearing LUE Weight Bearing Per Provider Order: Non weight bearing Other Position/Activity Restrictions: suction tube from sternotomy      Mobility  Bed Mobility Overal bed mobility: Needs Assistance Bed Mobility: Sit to Supine       Sit to supine: Mod assist, +2 for physical assistance   General bed mobility comments: mod A to LE's and guiding at trunk. Needed multiple cues and explaining before pt understood to lie down to R side.    Transfers Overall transfer level: Needs assistance Equipment used:  Winfred walker) Transfers: Sit to/from Stand Sit to Stand: Min assist, +2 safety/equipment           General transfer comment: vc's for scooting fwd. Min A for fwd wt shift. Pt instructed to hold heart pillow during transfer    Ambulation/Gait Ambulation/Gait assistance: +2 safety/equipment, Min assist Gait Distance (Feet): 166 Feet Assistive device: Elyn Finder Gait Pattern/deviations: Step-through pattern Gait velocity: decreased Gait velocity interpretation: <1.31 ft/sec, indicative of household ambulator   General Gait Details: Elyn walker guided by OT as well as giving pt vc's for direction in hallway. vc's also for erect posture and later EVa walker raised. Had difficult time getting pleth reading but pt able to speak throughout ambulation  Stairs            Wheelchair Mobility     Tilt Bed    Modified Rankin (  Stroke Patients Only)       Balance Overall balance assessment: Needs assistance Sitting-balance support: No upper extremity supported, Feet supported Sitting balance-Leahy Scale: Good     Standing balance support: Bilateral upper extremity supported, During  functional activity Standing balance-Leahy Scale: Poor Standing balance comment: reliant on UE support                             Pertinent Vitals/Pain Pain Assessment Pain Assessment: Faces Faces Pain Scale: Hurts a little bit Pain Location: chest Pain Descriptors / Indicators: Aching Pain Intervention(s): Monitored during session    Home Living Family/patient expects to be discharged to:: Private residence Living Arrangements: Children Available Help at Discharge: Family;Available PRN/intermittently Type of Home: Apartment Home Access: Stairs to enter Entrance Stairs-Rails: Left Entrance Stairs-Number of Steps: 6   Home Layout: One level Home Equipment: None Additional Comments: pt reports son is in and out    Prior Function Prior Level of Function : Independent/Modified Independent             Mobility Comments: independent, uses [employer] for transportation ADLs Comments: basin bathing at baseline, independent IADLs (family provides meals)     Extremity/Trunk Assessment   Upper Extremity Assessment Upper Extremity Assessment: Defer to OT evaluation    Lower Extremity Assessment Lower Extremity Assessment: Overall WFL for tasks assessed    Cervical / Trunk Assessment Cervical / Trunk Assessment: Normal  Communication   Communication Communication: No apparent difficulties Cueing Techniques: Verbal cues;Tactile cues  Cognition Arousal: Alert Behavior During Therapy: WFL for tasks assessed/performed Overall Cognitive Status: No family/caregiver present to determine baseline cognitive functioning                                 General Comments: pt with slow processing and often asks for things to be repeated or to be repeated more slowly. Has difficulty sequencing tasks. In part, this is due to visual deficits. Expect that this is pt's baseline but no family present to verify        General Comments General comments (skin  integrity, edema, etc.): HR stable with ambulation, low 100's. SPO2 in 90's on 4L O2 (when reading). Pt denied dizziness    Exercises     Assessment/Plan    PT Assessment Patient needs continued PT services  PT Problem List Decreased activity tolerance;Decreased mobility;Decreased balance;Decreased safety awareness;Decreased knowledge of precautions;Decreased knowledge of use of DME;Cardiopulmonary status limiting activity;Pain       PT Treatment Interventions DME instruction;Gait training;Functional mobility training;Therapeutic activities;Therapeutic exercise;Balance training;Patient/family education    PT Goals (Current goals can be found in the Care Plan section)  Acute Rehab PT Goals Patient Stated Goal: return home PT Goal Formulation: With patient Time For Goal Achievement: 07/14/23 Potential to Achieve Goals: Good    Frequency Min 1X/week     Co-evaluation PT/OT/SLP Co-Evaluation/Treatment: Yes Reason for Co-Treatment: Complexity of the patient's impairments (multi-system involvement);For patient/therapist safety PT goals addressed during session: Mobility/safety with mobility;Balance;Proper use of DME         AM-PAC PT 6 Clicks Mobility  Outcome Measure Help needed turning from your back to your side while in a flat bed without using bedrails?: A Little Help needed moving from lying on your back to sitting on the side of a flat bed without using bedrails?: A Lot Help needed moving to and from a bed to a chair (  including a wheelchair)?: A Lot Help needed standing up from a chair using your arms (e.g., wheelchair or bedside chair)?: A Lot Help needed to walk in hospital room?: Total Help needed climbing 3-5 steps with a railing? : Total 6 Click Score: 11    End of Session Equipment Utilized During Treatment: Oxygen Activity Tolerance: Patient tolerated treatment well Patient left: in bed;with call bell/phone within reach Nurse Communication: Mobility status PT  Visit Diagnosis: Unsteadiness on feet (R26.81);Difficulty in walking, not elsewhere classified (R26.2);Pain Pain - part of body:  (chest)    Time: 1016-1110 PT Time Calculation (min) (ACUTE ONLY): 54 min   Charges:   PT Evaluation $PT Eval Moderate Complexity: 1 Mod PT Treatments $Gait Training: 8-22 mins PT General Charges $$ ACUTE PT VISIT: 1 Visit         Richerd Lipoma, PT  Acute Rehab Services Secure chat preferred Office [phone]   Richerd CROME Sherylann Vangorden 06/30/2023, 2:21 PM

## 2023-06-30 NOTE — Progress Notes (Signed)
 NAME:  Corey Park, MRN:  [id number], DOB:  05-17-1971, LOS: 5 ADMISSION DATE:  06/25/2023 CONSULTATION DATE:  06/28/2023 REFERRING MD:  Shyrl - TCTS CHIEF COMPLAINT:  Postoperative vent management   History of Present Illness:  Corey Park is seen in consultation at the request of Dr. Shyrl (TCTS) for management of mechanical ventilation and hemodynamics post-CABG x 60.  53 year old man who presented to [facility] 12/30 for CP x 3-4 days. PMHx significant for HTN, CAD (3-vessel disease), CHF (Echo 05/2023 EF 40-45%, +RWMAs, G2DD), dilated cardiomyopathy, T2DM, ESRD on HD (MWF via RUE AVF), legal blindness.  Found to have NSTEMI on admission. Underwent LHC 12/30 demonstrating severe 3-vessel CAD and CABG was recommended. TCTS consulted and underwent CABG x 3 (LIMA to LAD, RSVG to distal RCA, OM) with Dr. Shyrl 1/2. Intraoperative course was uncomplicated. EBL , received 1U Plt. X-clamp time , pump time .  PCCM consulted for postoperative management.  Pertinent Medical History:   Past Medical History:  Diagnosis Date   Allergy, unspecified, initial encounter 08/25/2019   Asthma    as a child   Cellulitis, perineum 11/26/2019   CHF (congestive heart failure) (HCC) 2021   Complication of vascular dialysis catheter 08/25/2019   COVID-19 virus infection 07/27/2019   Diabetes mellitus without complication (HCC)    type 2   Dilated cardiomyopathy (HCC) 07/03/2018   ESRD on hemodialysis (HCC) 06/17/2018   MWF at [facility]   Fe deficiency anemia 12/23/2018   Hypertension    Legally blind    B/L   Pneumonia 2021   Type 2 diabetes mellitus with diabetic peripheral angiopathy without gangrene (HCC) 08/25/2019   Unspecified protein-calorie malnutrition (HCC) 08/25/2019   Significant Hospital Events: Including procedures, antibiotic start and stop dates in addition to other pertinent events   1/2 - CABG x 3. CCM consulted for MV/hemodynamics. Extubated.  Interim History /  Subjective:  This morning he still has poor appetite but pain is improved.   Objective:  Blood pressure 100/69, pulse 95, temperature 98.6 F (37 C), temperature source Oral, resp. rate (!) 29, height 6' 1 (1.854 m), weight 107 kg, SpO2 99%. CVP:  [8 mmHg] 8 mmHg      Intake/Output Summary (Last 24 hours) at 06/30/2023 0959 Last data filed at 06/30/2023 0500 Gross per 24 hour  Intake 1130.57 ml  Output 90 ml  Net 1040.57 ml   Filed Weights   06/28/23 0646 06/29/23 0600 06/30/23 0600  Weight: 103.5 kg 104.8 kg 107 kg   Physical Examination: General: Chronically ill-appearing man lying in bed no acute distress.  Previously seen this morning walking with physical therapy. HEENT: [place]/AT, eyes anicteric Neuro: Awake, alert, answering questions appropriately.  Moving all extremities. CV: S1-S2, regular rate and rhythm.  Midline incision covered with surgical dressing  PULM: Breathing comfortably on nasal cannula, faint basilar rales. GI: Soft, nontender Extremities: Lower extremity edema, right upper extremity fistula Skin: Warm, dry, no rashes  Bicarb 26 BUN 31 Cr 10.58 Glucose in low 100s  Chest tube output 90cc CXR personally reviewed-bilateral pleural effusions, mild pulmonary edema  Resolved Hospital Problem List:  Postoperative vent management, resolved  Assessment & Plan:   NSTEMI Coronary artery disease status post CABG x 3-  LIMA to LAD, RSVG to distal RCA, OM   HFrEF due to ischemic cardiomyopathy; EF 40 to 45%, decreased since 03/2023 echocardiogram - Postop management per TCTS - Advance diet and mobility as tolerated.  Needs repeat cues not to use arms to pull for  sternal precautions. -Metoprolol  has been held due to low BPs, can resume as blood pressure rises. - Aspirin , statin. - Consider Plavix  once chest tubes are out> DC today per cardiothoracic surgery. - Required significant cues for walking due to blindness.  Did well with physical therapy with 2 aides  today and steady.  Anticipate he might need CIR for safe rehabilitation prior to discharge home.  Worry about his site and need for sternal precautions to be followed. -Monitor on telemetry - Pain control per protocol; avoid morphine  due to renal failure   End-stage renal disease; hypervolemia postop --Intermittent hemodialysis per nephrology - Renally dose meds  -Phos binders per nephrology.   Hyperglycemia; prediabetes, A1c 5.8. - con't semglee , reduce dose to 7 units BID. Not on insulin  at home. -SSI PRN -monitor for hypoglycemia   Chronic anemia and thrombocytopenia; acutely worse due to expected operative loss -No current indication for transfusion - Monitor  Bilateral pleural effusions -repeat CXR today -Ongoing volume removal with dialysis  PCCM will be available as needed.  Please call with questions.  Best Practice: (right click and Reselect all SmartList Selections daily)   Diet/type: Regular consistency (see orders) DVT prophylaxis: prophylactic heparin   GI prophylaxis: PPI Lines: N/A Foley:  Yes, and it is still needed Code Status:  full code Last date of multidisciplinary goals of care discussion [Per Primary Team]  Critical care time:     Corey SHAUNNA Gaskins, DO 06/30/23 4:57 PM [facility]  For contact information, see Amion. If no response to pager, please call PCCM consult pager. After hours, 7PM- 7AM, please call Elink.

## 2023-06-30 NOTE — Plan of Care (Signed)
  Problem: Coping: Goal: Ability to adjust to condition or change in health will improve Outcome: Progressing   Problem: Metabolic: Goal: Ability to maintain appropriate glucose levels will improve Outcome: Progressing   Problem: Nutritional: Goal: Maintenance of adequate nutrition will improve Outcome: Progressing   Problem: Health Behavior/Discharge Planning: Goal: Ability to manage health-related needs will improve Outcome: Not Progressing   Problem: Nutritional: Goal: Progress toward achieving an optimal weight will improve Outcome: Not Progressing   Problem: Activity: Goal: Ability to return to baseline activity level will improve Outcome: Not Progressing   Problem: Clinical Measurements: Goal: Respiratory complications will improve Outcome: Not Progressing Goal: Cardiovascular complication will be avoided Outcome: Not Progressing   Problem: Coping: Goal: Level of anxiety will decrease Outcome: Not Progressing

## 2023-06-30 NOTE — Evaluation (Signed)
 Occupational Therapy Evaluation Patient Details Name: Corey Park MRN: [id number] DOB: 1970/09/14 Today's Date: 06/30/2023   History of Present Illness Pt is a 53 y/o male presenting on 06/25/23 with acute central chest pain and dyspnea. Pt with NSTEMI and underwent CABG x3 on 06/28/23. Had hyperkalemia post surgery and underwent urgent HD. 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   Clinical Impression   At baseline, pt completes ADLs, most IADLs, and functional mobility without an AD Independent to Mod I. At baseline, pt receives assistance from family for meal prep and uses [employer] for transportation. Pt now presents with decreased activity tolerance, decreased knowledge of sternal precautions, decreased knowledge of compensatory strategies for safely completing ADLs while adhering to sternal precautions, decreased balance, decreased knowledge of DME, and decreased safety and independence with functional tasks. Pt currently demonstrates ability to complete UB ADLs with Set up to Mod assist, LB ADLs with Max assist, and functional mobility/transfers with an Scientist, research (medical) with Min assist +2 for safety. Pt requires cues during functional tasks to adhere to sternal precautions. Pt is highly motivated to return to PLOF and participated well in session. Pt will benefit from acute skilled OT services to address deficits outlined below and to increase safety and independence with functional tasks. Post acute discharge, pt will benefit from intensive inpatient skilled OT services > 3 hours per day to maximize rehab potential.       If plan is discharge home, recommend the following: A little help with walking and/or transfers;Two people to help with walking and/or transfers;A lot of help with bathing/dressing/bathroom;Assistance with cooking/housework;Assist for transportation;Help with stairs or ramp for entrance;Direct supervision/assist for medications  management;Direct supervision/assist for financial management    Functional Status Assessment  Patient has had a recent decline in their functional status and demonstrates the ability to make significant improvements in function in a reasonable and predictable amount of time.  Equipment Recommendations  Other (comment) (defer to next level of care)    Recommendations for Other Services Rehab consult     Precautions / Restrictions Precautions Precautions: Sternal Precaution Booklet Issued: Yes (comment) Precaution Comments: verbally reviewed since pt cannot see and left handout for pt's family Restrictions Weight Bearing Restrictions Per Provider Order: Yes RUE Weight Bearing Per Provider Order: Non weight bearing LUE Weight Bearing Per Provider Order: Non weight bearing Other Position/Activity Restrictions: suction tube from sternotomy      Mobility Bed Mobility Overal bed mobility: Needs Assistance Bed Mobility: Sit to Supine       Sit to supine: Mod assist, +2 for physical assistance   General bed mobility comments: mod A to LE's and guiding at trunk. Needed multiple multi-modal cues and simplified explaination before pt understood to lie down to R side. Required cues to maintain sternal precautions.    Transfers Overall transfer level: Needs assistance Equipment used:  Winfred walker) Transfers: Sit to/from Stand, Bed to chair/wheelchair/BSC Sit to Stand: Min assist, +2 safety/equipment     Step pivot transfers: Min assist, +2 safety/equipment     General transfer comment: vc's for scooting fwd. Min A for fwd wt shift. Pt instructed to hold heart pillow during transfer and requiring occasional cues to maintain sternal precautions with use of Eva walker      Balance Overall balance assessment: Needs assistance Sitting-balance support: No upper extremity supported, Feet supported Sitting balance-Leahy Scale: Good     Standing balance support: Bilateral upper  extremity supported,  During functional activity Standing balance-Leahy Scale: Poor Standing balance comment: reliant on UE support                           ADL either performed or assessed with clinical judgement   ADL Overall ADL's : Needs assistance/impaired Eating/Feeding: Set up;Sitting (orientation to tray due to blindness)   Grooming: Contact guard assist;Sitting (orientation to supplies due to blindness; cues to adhere to sternal precautions)   Upper Body Bathing: Moderate assistance;Sitting;Cueing for compensatory techniques;Cueing for sequencing (orientation to supplies due to blindness; cues to adhere to sternal precautions)   Lower Body Bathing: Maximal assistance;Sitting/lateral leans;Sit to/from stand;Cueing for safety;Cueing for compensatory techniques;Cueing for sequencing (orientation to supplies due to blindness; cues to adhere to sternal precautions)   Upper Body Dressing : Minimal assistance;Moderate assistance;Cueing for compensatory techniques;Sitting;Cueing for sequencing (cues to adhere to sternal precautions)   Lower Body Dressing: Maximal assistance;Sitting/lateral leans;Sit to/from stand;Cueing for compensatory techniques;Cueing for sequencing (cues to adhere to sternal precautions)   Toilet Transfer: Minimal assistance;+2 for safety/equipment;Ambulation;BSC/3in1;Cueing for safety;Cueing for sequencing Winfred even)   Toileting- Clothing Manipulation and Hygiene: Maximal assistance;Cueing for compensatory techniques;Sitting/lateral lean;Sit to/from stand;Cueing for safety;Cueing for sequencing (cues to adhere to sternal precautions)       Functional mobility during ADLs: Minimal assistance;+2 for safety/equipment;Cueing for safety;Cueing for sequencing Winfred walker) General ADL Comments: Pt with decreased activity tolerance. OT instructed pt in techniques for adhering to sternal precautions during funcitonal tasks with pt verbalizing understanding but  requiring cues throughout session to adhere to precautions. Pt will benefit from reinforcement of education.     Vision Baseline Vision/History: 2 Legally blind Ability to See in Adequate Light: 4 Severely impaired Patient Visual Report: No change from baseline       Perception         Praxis         Pertinent Vitals/Pain Pain Assessment Pain Assessment: Faces Faces Pain Scale: Hurts a little bit Pain Location: chest Pain Descriptors / Indicators: Aching Pain Intervention(s): Monitored during session     Extremity/Trunk Assessment Upper Extremity Assessment Upper Extremity Assessment: Left hand dominant;Overall [facility] for tasks assessed (assessed within sternal precautions)   Lower Extremity Assessment Lower Extremity Assessment: Defer to PT evaluation   Cervical / Trunk Assessment Cervical / Trunk Assessment: Normal   Communication Communication Communication: Hearing impairment (Pt reports he is HOH and benefited from increased volume and decreased background noise during session.) Cueing Techniques: Verbal cues;Tactile cues   Cognition Arousal: Alert Behavior During Therapy: WFL for tasks assessed/performed Overall Cognitive Status: No family/caregiver present to determine baseline cognitive functioning                                 General Comments: Pt AAOx4 and pleasant throughout session. Pt with slow processing and difficulty sequencing novel tasks. Pt often asks for things to be repeated or to be repeated more slowly and benefits from use of short sentances. Pt able to follow 1 step instructions consistently with increased time. Pt also benefits from orientation to environment and narration of staff activities/location due to pt blindness. Expect pt's cognition is at baseline but no family present to verify.     General Comments  HR stable with ambulation, low 100's. SPO2 in 90's on 4L O2 (when reading). Pt denied dizziness    Exercises      Shoulder Instructions  Home Living Family/patient expects to be discharged to:: Private residence Living Arrangements: Children (adult son) Available Help at Discharge: Family;Available PRN/intermittently Type of Home: Apartment Home Access: Stairs to enter Entrance Stairs-Number of Steps: 6 Entrance Stairs-Rails: Left Home Layout: One level     Bathroom Shower/Tub: Sponge bathes at baseline   Bathroom Toilet: Standard Bathroom Accessibility: Yes   Home Equipment: None   Additional Comments: Pt reports his son is in and out of the home.      Prior Functioning/Environment Prior Level of Function : Independent/Modified Independent;Working/employed             Mobility Comments: independent to Mod I, uses [employer] for transportation ADLs Comments: basin bathing at baseline, laregely independent to Mod with ADLs and IADLs (family provides meals); works at [employer]        OT Problem List: Decreased activity tolerance;Impaired balance (sitting and/or standing);Decreased safety awareness;Decreased knowledge of use of DME or AE;Decreased knowledge of precautions;Cardiopulmonary status limiting activity      OT Treatment/Interventions: Self-care/ADL training;Energy conservation;DME and/or AE instruction;Therapeutic activities;Patient/family education;Balance training    OT Goals(Current goals can be found in the care plan section) Acute Rehab OT Goals Patient Stated Goal: to be as independent as possible OT Goal Formulation: With patient Time For Goal Achievement: 07/14/23 Potential to Achieve Goals: Good ADL Goals Pt Will Perform Upper Body Bathing: with contact guard assist;sitting (adhering to sternal precautions) Pt Will Perform Lower Body Bathing: with min assist;sitting/lateral leans;sit to/from stand (adhering to sternal precautions) Pt Will Perform Upper Body Dressing: with contact guard assist;sitting (adhering to sternal precautions) Pt Will  Perform Lower Body Dressing: with min assist;sitting/lateral leans;sit to/from stand (adhering to sternal precautions) Pt Will Transfer to Toilet: with min assist;ambulating;bedside commode (with least restrictive AD; adhering to sternal precautions) Pt Will Perform Toileting - Clothing Manipulation and hygiene: with min assist;sitting/lateral leans;sit to/from stand (adhering to sternal precautions)  OT Frequency: Min 1X/week    Co-evaluation PT/OT/SLP Co-Evaluation/Treatment: Yes Reason for Co-Treatment: Complexity of the patient's impairments (multi-system involvement);For patient/therapist safety PT goals addressed during session: Mobility/safety with mobility;Balance;Proper use of DME OT goals addressed during session: ADL's and self-care;Proper use of Adaptive equipment and DME      AM-PAC OT 6 Clicks Daily Activity     Outcome Measure Help from another person eating meals?: A Little Help from another person taking care of personal grooming?: A Little Help from another person toileting, which includes using toliet, bedpan, or urinal?: A Lot Help from another person bathing (including washing, rinsing, drying)?: A Lot Help from another person to put on and taking off regular upper body clothing?: A Little Help from another person to put on and taking off regular lower body clothing?: A Lot 6 Click Score: 15   End of Session Equipment Utilized During Treatment: Oxygen Winfred walker) Nurse Communication: Mobility status;Other (comment) (vital signs)  Activity Tolerance: Patient tolerated treatment well Patient left: in bed;with call bell/phone within reach;with bed alarm set  OT Visit Diagnosis: Other abnormalities of gait and mobility (R26.89);Other (comment) (decreased activity tolerance)                Time: 8991-8897 OT Time Calculation (min): 54 min Charges:  OT General Charges $OT Visit: 1 Visit OT Evaluation $OT Eval Moderate Complexity: 1 Mod  Margarie Rockey HERO., OTR/L,  MA Acute Rehab [phone]   Margarie FORBES Horns 06/30/2023, 6:52 PM

## 2023-06-30 NOTE — Progress Notes (Signed)
 Patient ID: Jovane Foutz, male   DOB: 1970/11/28, 53 y.o.   MRN: [id number] S: Feeling well this morning, no complaints.  O:BP 110/62 (BP Location: Left Arm)   Pulse 87   Temp 98.6 F (37 C) (Oral)   Resp (!) 24   Ht 6' 1 (1.854 m)   Wt 107 kg   SpO2 99%   BMI 31.12 kg/m   Intake/Output Summary (Last 24 hours) at 06/30/2023 0857 Last data filed at 06/30/2023 0500 Gross per 24 hour  Intake 1130.57 ml  Output 90 ml  Net 1040.57 ml   Intake/Output: I/O last 3 completed shifts: In: 2066.2 [P.O.:240; I.V.:576.2; IV Piggyback:1250.1] Out: 2350 [Urine:20; Other:2000; Chest Tube:330]  Intake/Output this shift:  No intake/output data recorded. Weight change: 2.219 kg Gen: NAD CVS: RRR Resp:CTA Abd: +BS, soft, NT/ND Ext: no edema, RUE AVF +T/B  Recent Labs  Lab 06/25/23 0203 06/25/23 0208 06/25/23 0448 06/26/23 0416 06/27/23 1535 06/28/23 0308 06/28/23 0755 06/28/23 1124 06/28/23 1152 06/28/23 1302 06/28/23 1909 06/28/23 2250 06/28/23 2349 06/28/23 2351 06/29/23 0544 06/29/23 0812 06/29/23 1718 06/30/23 0103 06/30/23 0513  NA 140   < > 140   < > 139 135   < > 137 135   < > 133* 134* 133* 131* 135  --  134*  --  135  K 4.5   < > 4.8   < > 4.8 4.3   < > 6.3* 5.8*   < > 7.2* 6.5* 6.8* 6.5* 3.6 4.4 4.8 5.0 4.9  4.9  CL 97*   < > 98   < > 99 95*   < > 98 100  --  99  --   --  97* 96*  --  97*  --  95*  CO2 24  --  25   < > 25 27  --   --   --   --  23  --   --  22 25  --  25  --  26  GLUCOSE 156*   < > 175*   < > 66* 79   < > 106* 119*  --  107*  --   --  125* 111*  --  133*  --  97  BUN 44*   < > 45*   < > 56* 31*   < > 34* 34*  --  37*  --   --  36* 13  --  25*  --  31*  CREATININE 14.45*   < > 14.50*   < > 17.84* 12.30*   < > 11.40* 10.40*  --  12.40*  --   --  12.45* 5.14*  --  8.58*  --  10.58*  ALBUMIN  3.6  --   --   --  3.0*  --   --   --   --   --   --   --   --   --   --   --   --   --  3.3*  CALCIUM  9.3  --  8.7*   < > 9.5 8.8*  --   --   --   --  8.8*  --   --   8.6* 8.7*  --  8.2*  --  8.5*  PHOS  --   --  7.5*  --  5.9*  --   --   --   --   --   --   --   --   --   --   --   --   --  7.8*  AST 25  --   --   --   --   --   --   --   --   --   --   --   --   --   --   --   --   --   --   ALT 15  --   --   --   --   --   --   --   --   --   --   --   --   --   --   --   --   --   --    < > = values in this interval not displayed.   Liver Function Tests: Recent Labs  Lab 06/25/23 0203 06/27/23 1535 06/30/23 0513  AST 25  --   --   ALT 15  --   --   ALKPHOS 79  --   --   BILITOT 0.7  --   --   PROT 8.3*  --   --   ALBUMIN  3.6 3.0* 3.3*   No results for input(s): LIPASE, AMYLASE in the last 168 hours. No results for input(s): AMMONIA in the last 168 hours. CBC: Recent Labs  Lab 06/25/23 0203 06/25/23 0208 06/28/23 0308 06/28/23 0755 06/28/23 1254 06/28/23 1302 06/28/23 1909 06/28/23 2250 06/28/23 2349 06/29/23 0544 06/29/23 1718  WBC 9.6   < > 6.8  --  9.4  --  11.7*  --   --  10.4 10.3  NEUTROABS 6.6  --   --   --   --   --  9.8*  --   --   --   --   HGB 12.8*   < > 10.3*   < > 8.1*   < > 9.2*   < > 8.8* 9.0* 7.9*  HCT 38.7*   < > 30.5*   < > 23.9*   < > 27.9*   < > 26.0* 27.0* 24.7*  MCV 97.2   < > 95.3  --  97.2  --  97.9  --   --  97.5 101.6*  PLT 177   < > 130*   < > 89*  --  127*  --   --  142* 113*   < > = values in this interval not displayed.   Cardiac Enzymes: No results for input(s): CKTOTAL, CKMB, CKMBINDEX, TROPONINI in the last 168 hours. CBG: Recent Labs  Lab 06/29/23 1300 06/29/23 1420 06/29/23 1601 06/29/23 2202 06/30/23 0630  GLUCAP 103* 130* 120* 111* 101*    Iron  Studies: No results for input(s): IRON , TIBC, TRANSFERRIN, FERRITIN in the last 72 hours. Studies/Results: DG Chest Port 1 View Result Date: 06/29/2023 CLINICAL DATA:  [id number] S/P CABG x 3 [id number] EXAM: PORTABLE CHEST 1 VIEW COMPARISON:  Chest radiograph from one day prior. FINDINGS: Right internal jugular central venous  catheter terminates in the lower third of the SVC, unchanged. Intact sternotomy wires. Stable mediastinal drain terminating over the left mid mediastinum. Stable cardiomediastinal silhouette with mild cardiomegaly. No pneumothorax. Stable small bilateral pleural effusions and moderate patchy bibasilar hazy opacities with low lung volumes. No overt pulmonary edema. IMPRESSION: 1. Stable small bilateral pleural effusions, low lung volumes and moderate patchy bibasilar hazy opacities, favor atelectasis. 2. Stable mild cardiomegaly without overt pulmonary edema. Electronically Signed   By: Selinda DELENA Blue M.D.   On: 06/29/2023 10:16   DG CHEST PORT 1 VIEW Result Date: 06/29/2023  CLINICAL DATA:  Hypoxia EXAM: PORTABLE CHEST 1 VIEW COMPARISON:  Film from earlier in the same day. FINDINGS: Endotracheal tube and gastric catheter have been removed. Right jugular central line is again noted and stable. Heart is enlarged. Postsurgical changes are seen. Bibasilar atelectasis is noted with a small right-sided pleural effusion. IMPRESSION: Bibasilar atelectasis and small pleural effusion. Electronically Signed   By: Oneil Devonshire M.D.   On: 06/29/2023 00:05   DG Chest Port 1 View Result Date: 06/28/2023 CLINICAL DATA:  Status post CABG EXAM: PORTABLE CHEST 1 VIEW COMPARISON:  06/25/2023 FINDINGS: Single frontal view of the chest demonstrates endotracheal tube overlying tracheal air column tip at level of thoracic inlet. Enteric catheter passes below diaphragm, tip and side port project over the gastric fundus. Right internal jugular catheter tip overlies superior vena cava. Median sternotomy wires are noted. The cardiac silhouette is enlarged. Lung volumes are diminished, with crowding of the central vasculature. Retrocardiac atelectasis. Small bilateral pleural effusions with blunting of the costophrenic angles. No evidence of pneumothorax. IMPRESSION: 1. Postsurgical changes from CABG.  Support devices as above. 2.  Hypoventilatory changes, with small bilateral pleural effusions. Electronically Signed   By: Ozell Daring M.D.   On: 06/28/2023 15:44    acetaminophen   1,000 mg Oral Q6H   Or   acetaminophen  (TYLENOL ) oral liquid 160 mg/5 mL  1,000 mg Per Tube Q6H   aspirin  EC  325 mg Oral Daily   Or   aspirin   324 mg Per Tube Daily   atorvastatin   80 mg Oral Daily   bisacodyl   10 mg Oral Daily   Or   bisacodyl   10 mg Rectal Daily   calcium  acetate  2,001 mg Oral TID with meals   Chlorhexidine  Gluconate Cloth  6 each Topical Q0600   docusate sodium   200 mg Oral Daily   insulin  aspart  0-15 Units Subcutaneous TID WC   insulin  glargine-yfgn  10 Units Subcutaneous BID   metoprolol  tartrate  12.5 mg Oral BID   Or   metoprolol  tartrate  12.5 mg Per Tube BID   pantoprazole   40 mg Oral Daily   sodium chloride  flush  3 mL Intravenous Q12H    BMET    Component Value Date/Time   NA 135 06/30/2023 0513   NA 141 01/16/2019 1517   K 4.9 06/30/2023 0513   K 4.9 06/30/2023 0513   CL 95 (L) 06/30/2023 0513   CO2 26 06/30/2023 0513   GLUCOSE 97 06/30/2023 0513   BUN 31 (H) 06/30/2023 0513   BUN 62 (H) 01/16/2019 1517   CREATININE 10.58 (H) 06/30/2023 0513   CALCIUM  8.5 (L) 06/30/2023 0513   GFRNONAA 5 (L) 06/30/2023 0513   GFRAA  09/12/2022 0330    SPECIMEN HEMOLYZED. HEMOLYSIS MAY AFFECT INTEGRITY OF RESULTS.   CBC    Component Value Date/Time   WBC 10.3 06/29/2023 1718   RBC 2.43 (L) 06/29/2023 1718   HGB 7.9 (L) 06/29/2023 1718   HGB 9.5 (L) 01/16/2019 1517   HCT 24.7 (L) 06/29/2023 1718   HCT 29.1 (L) 01/16/2019 1517   PLT 113 (L) 06/29/2023 1718   PLT 189 01/16/2019 1517   MCV 101.6 (H) 06/29/2023 1718   MCV 92 01/16/2019 1517   MCH 32.5 06/29/2023 1718   MCHC 32.0 06/29/2023 1718   RDW 17.2 (H) 06/29/2023 1718   RDW 14.9 01/16/2019 1517   LYMPHSABS 0.6 (L) 06/28/2023 1909   LYMPHSABS 1.0 01/16/2019 1517   MONOABS 1.1 (H) 06/28/2023  1909   EOSABS 0.1 06/28/2023 1909   EOSABS 0.2  01/16/2019 1517   BASOSABS 0.0 06/28/2023 1909   BASOSABS 0.0 01/16/2019 1517    Dialysis Orders: Outpatient dialysis orders: [facility] MWF 180NRe 4 hours BFR 450 DFR Auto 1.5 EDW 102.3kg 2K 2Ca AVF 15g No heparin  Mircera 50mcg IV q 2 weeks- last dose 06/19/23 Calcitriol  1.5 mcg PO q HD   Assessment/Plan:  NSTEMI: 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 On IV heparin  and nitroglycerin . 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  Had hyperkalemia post operatively but improved after urgent dialysis early yesterday morning.  Currently resting comfortably but required pain meds throughout the night.  Management per admitting team/cardiology  ESRD:  Typically on MWF schedule, off schedule this week due to holiday/missed HD on Sunday. HD delayed today due to nitroglycerin  drip as above. After discussing with PMD and nephrology attending, plan is to stop nitroglycerin  drip during dialysis then resume when he gets back to the floor. Respiratory status stable, will follow labs over the weekend.  Plan to continue his outpatient schedule on Monday.  Hypertension/volume: BP elevated, may be partially due to stress/volume. Monitor post HD. Continue current medications as per cardiology. Not in respiratory distress, no edema on exam.   Anemia: Hgb 9 following surgery. ESA recently dosed, continue to monitor  Metabolic bone disease: Calcium  controlled, phos elevated. Continue VDRA and binders  Nutrition:  Alb 3.6, will need renal diet/protein supplements once tolerating PO  Fairy RONAL Sellar, MD [facility] ([place]) [phone]

## 2023-07-01 LAB — CBC
HCT: 20.7 % — ABNORMAL LOW (ref 39.0–52.0)
Hemoglobin: 6.7 g/dL — CL (ref 13.0–17.0)
MCH: 32.8 pg (ref 26.0–34.0)
MCHC: 32.4 g/dL (ref 30.0–36.0)
MCV: 101.5 fL — ABNORMAL HIGH (ref 80.0–100.0)
Platelets: 105 10*3/uL — ABNORMAL LOW (ref 150–400)
RBC: 2.04 MIL/uL — ABNORMAL LOW (ref 4.22–5.81)
RDW: 17.4 % — ABNORMAL HIGH (ref 11.5–15.5)
WBC: 10.1 10*3/uL (ref 4.0–10.5)
nRBC: 0.2 % (ref 0.0–0.2)

## 2023-07-01 LAB — TYPE AND SCREEN
ABO/RH(D): O POS
Antibody Screen: NEGATIVE
Unit division: 0
Unit division: 0
Unit division: 0
Unit division: 0

## 2023-07-01 LAB — BPAM RBC
Blood Product Expiration Date: 202501292359
Blood Product Expiration Date: 202501292359
Blood Product Expiration Date: 202501302359
Blood Product Expiration Date: 202501302359
ISSUE DATE / TIME: 202501020942
ISSUE DATE / TIME: 202501020942
Unit Type and Rh: 5100
Unit Type and Rh: 5100
Unit Type and Rh: 5100
Unit Type and Rh: 5100

## 2023-07-01 LAB — BASIC METABOLIC PANEL
Anion gap: 21 — ABNORMAL HIGH (ref 5–15)
BUN: 44 mg/dL — ABNORMAL HIGH (ref 6–20)
CO2: 22 mmol/L (ref 22–32)
Calcium: 8.8 mg/dL — ABNORMAL LOW (ref 8.9–10.3)
Chloride: 93 mmol/L — ABNORMAL LOW (ref 98–111)
Creatinine, Ser: 13.41 mg/dL — ABNORMAL HIGH (ref 0.61–1.24)
GFR, Estimated: 4 mL/min — ABNORMAL LOW (ref >60)
Glucose, Bld: 71 mg/dL (ref 70–99)
Potassium: 4.8 mmol/L (ref 3.5–5.1)
Sodium: 136 mmol/L (ref 135–145)

## 2023-07-01 LAB — PREPARE RBC (CROSSMATCH)

## 2023-07-01 LAB — GLUCOSE, CAPILLARY
Glucose-Capillary: 83 mg/dL (ref 70–99)
Glucose-Capillary: 90 mg/dL (ref 70–99)
Glucose-Capillary: 139 mg/dL — ABNORMAL HIGH (ref 70–99)
Glucose-Capillary: 117 mg/dL — ABNORMAL HIGH (ref 70–99)

## 2023-07-01 LAB — RENAL FUNCTION PANEL
Albumin: 3 g/dL — ABNORMAL LOW (ref 3.5–5.0)
Anion gap: 18 — ABNORMAL HIGH (ref 5–15)
BUN: 43 mg/dL — ABNORMAL HIGH (ref 6–20)
CO2: 23 mmol/L (ref 22–32)
Calcium: 8.6 mg/dL — ABNORMAL LOW (ref 8.9–10.3)
Chloride: 94 mmol/L — ABNORMAL LOW (ref 98–111)
Creatinine, Ser: 13.22 mg/dL — ABNORMAL HIGH (ref 0.61–1.24)
GFR, Estimated: 4 mL/min — ABNORMAL LOW (ref >60)
Glucose, Bld: 75 mg/dL (ref 70–99)
Phosphorus: 8.7 mg/dL — ABNORMAL HIGH (ref 2.5–4.6)
Potassium: 4.8 mmol/L (ref 3.5–5.1)
Sodium: 135 mmol/L (ref 135–145)

## 2023-07-01 MED ORDER — SODIUM CHLORIDE 0.9% IV SOLUTION: Freq: Once | INTRAVENOUS | Status: AC

## 2023-07-01 MED ORDER — SORBITOL 70 % SOLN
30.0000 mL | Freq: Once | Status: AC
  Administered 2023-07-01: 30 mL via ORAL
  Filled 2023-07-01: qty 30

## 2023-07-01 NOTE — Plan of Care (Signed)
   Problem: Coping: Goal: Ability to adjust to condition or change in health will improve Outcome: Progressing

## 2023-07-01 NOTE — Progress Notes (Addendum)
      [address]       [place] [address]             [phone]        3 Days Post-Op Procedure(s) (LRB): CORONARY ARTERY BYPASS GRAFTING TIMES THREE USING LEFT INTERNAL MAMMARY ARTERY AND RIGHT GREAT SAPHENOUS VEIN HARVESTED ENDOSCOPICALLY (N/A) TRANSESOPHAGEAL ECHOCARDIOGRAM (TEE) (N/A)  Subjective: Patient having blood drawn this am for labs. He has no specific complaint this am.  Objective: Vital signs in last 24 hours: Temp:  [97.8 F (36.6 C)-98.6 F (37 C)] 98.1 F (36.7 C) (01/05 0336) Pulse Rate:  [75-95] 75 (01/05 0336) Cardiac Rhythm: Normal sinus rhythm (01/04 1901) Resp:  [15-31] 15 (01/05 0336) BP: (100-119)/(58-75) 100/63 (01/05 0336) SpO2:  [80 %-100 %] 100 % (01/05 0336) Weight:  [110.4 kg] 110.4 kg (01/05 0600)  Pre op weight 103.5 kg Current Weight  07/01/23 110.4 kg      Intake/Output from previous day: No intake/output data recorded.   Physical Exam:  Cardiovascular: RRR Pulmonary: Slightly diminished bibasilar breath sounds Abdomen: Soft, non tender, bowel sounds present. Extremities: Mild bilateral lower extremity edema. Wounds: Aquacel removed and sternal wound is clean and dry.  No erythema or signs of infection. RLE dressing removed and wound is clean and dry. Neurologic: Moving all extremities  Lab Results: CBC: Recent Labs    06/29/23 0544 06/29/23 1718  WBC 10.4 10.3  HGB 9.0* 7.9*  HCT 27.0* 24.7*  PLT 142* 113*   BMET:  Recent Labs    06/29/23 1718 06/30/23 0103 06/30/23 0513  NA 134*  --  135  K 4.8 5.0 4.9  4.9  CL 97*  --  95*  CO2 25  --  26  GLUCOSE 133*  --  97  BUN 25*  --  31*  CREATININE 8.58*  --  10.58*  CALCIUM  8.2*  --  8.5*    PT/INR:  Lab Results  Component Value Date   INR 1.4 (H) 06/28/2023   ABG:  INR: Will add last result for INR, ABG once components are confirmed Will add last 4 CBG results once components are confirmed  Assessment/Plan:  1. CV - S/p NSTEMI.  SR. On Lopressor  12.5 mg bid. Will discuss with surgeon when to start Plavix  (ACS on admission) and decrease ec asa;today vs am. 2.  Pulmonary - On 2 L oxygen via [place]. Wean as able. Check PA/LAT CXR in am. Encourage incentive spirometer. 3. ESRD-HD days prior to surgery were Mon/Wed/Fri-Nephrology following and appears will have HD in am 4.  Anemia of chronic disease - Last H and H  5. DM-CBGs 153/124/83. On Insulin . Pre op HGA1C 5.8. 6. Deconditioned-PT/OT 7. LOC constipation 8. Disposition-patient legally blind. CIR to evaluate  Donielle M ZimmermanPA-C 7:48 AM   Agree with above Dispo planning  Monserrath Junio O Asal Teas

## 2023-07-01 NOTE — Plan of Care (Signed)
  Problem: Metabolic: Goal: Ability to maintain appropriate glucose levels will improve Outcome: Progressing   Problem: Coping: Goal: Ability to adjust to condition or change in health will improve Outcome: Not Progressing   Problem: Fluid Volume: Goal: Ability to maintain a balanced intake and output will improve Outcome: Not Progressing   Problem: Nutritional: Goal: Progress toward achieving an optimal weight will improve Outcome: Not Progressing   Problem: Health Behavior/Discharge Planning: Goal: Ability to manage health-related needs will improve Outcome: Not Progressing   Problem: Activity: Goal: Risk for activity intolerance will decrease Outcome: Not Progressing   Problem: Nutrition: Goal: Adequate nutrition will be maintained Outcome: Not Progressing   Problem: Activity: Goal: Risk for activity intolerance will decrease Outcome: Not Progressing   Problem: Clinical Measurements: Goal: Postoperative complications will be avoided or minimized Outcome: Not Progressing

## 2023-07-01 NOTE — Progress Notes (Signed)
 Patient ID: Corey Park, male   DOB: 1970/07/05, 53 y.o.   MRN: [id number] S: No new complaints O:BP 100/63 (BP Location: Right Arm)   Pulse 75   Temp 98.1 F (36.7 C) (Oral)   Resp 15   Ht 6' 1 (1.854 m)   Wt 110.4 kg Comment: pt too sleepy to get on standing scale  SpO2 100%   BMI 32.11 kg/m  No intake or output data in the 24 hours ending 07/01/23 0902 Intake/Output: I/O last 3 completed shifts: In: 240 [P.O.:240] Out: 90 [Chest Tube:90]  Intake/Output this shift:  No intake/output data recorded. Weight change: 3.4 kg Gen: NAD CVS: RRR Resp:CTA Abd: +BS, soft, NT/ND Ext: no edema, RUE AVF +T/B  Recent Labs  Lab 06/25/23 0203 06/25/23 0208 06/25/23 0448 06/26/23 0416 06/27/23 1535 06/28/23 0308 06/28/23 0755 06/28/23 1124 06/28/23 1152 06/28/23 1302 06/28/23 1909 06/28/23 2250 06/28/23 2349 06/28/23 2351 06/29/23 0544 06/29/23 0812 06/29/23 1718 06/30/23 0103 06/30/23 0513  NA 140   < > 140   < > 139 135   < > 137 135   < > 133* 134* 133* 131* 135  --  134*  --  135  K 4.5   < > 4.8   < > 4.8 4.3   < > 6.3* 5.8*   < > 7.2* 6.5* 6.8* 6.5* 3.6 4.4 4.8 5.0 4.9  4.9  CL 97*   < > 98   < > 99 95*   < > 98 100  --  99  --   --  97* 96*  --  97*  --  95*  CO2 24  --  25   < > 25 27  --   --   --   --  23  --   --  22 25  --  25  --  26  GLUCOSE 156*   < > 175*   < > 66* 79   < > 106* 119*  --  107*  --   --  125* 111*  --  133*  --  97  BUN 44*   < > 45*   < > 56* 31*   < > 34* 34*  --  37*  --   --  36* 13  --  25*  --  31*  CREATININE 14.45*   < > 14.50*   < > 17.84* 12.30*   < > 11.40* 10.40*  --  12.40*  --   --  12.45* 5.14*  --  8.58*  --  10.58*  ALBUMIN  3.6  --   --   --  3.0*  --   --   --   --   --   --   --   --   --   --   --   --   --  3.3*  CALCIUM  9.3  --  8.7*   < > 9.5 8.8*  --   --   --   --  8.8*  --   --  8.6* 8.7*  --  8.2*  --  8.5*  PHOS  --   --  7.5*  --  5.9*  --   --   --   --   --   --   --   --   --   --   --   --   --  7.8*  AST 25   --   --   --   --   --   --   --   --   --   --   --   --   --   --   --   --   --   --  ALT 15  --   --   --   --   --   --   --   --   --   --   --   --   --   --   --   --   --   --    < > = values in this interval not displayed.   Liver Function Tests: Recent Labs  Lab 06/25/23 0203 06/27/23 1535 06/30/23 0513  AST 25  --   --   ALT 15  --   --   ALKPHOS 79  --   --   BILITOT 0.7  --   --   PROT 8.3*  --   --   ALBUMIN  3.6 3.0* 3.3*   No results for input(s): LIPASE, AMYLASE in the last 168 hours. No results for input(s): AMMONIA in the last 168 hours. CBC: Recent Labs  Lab 06/25/23 0203 06/25/23 0208 06/28/23 0308 06/28/23 0755 06/28/23 1254 06/28/23 1302 06/28/23 1909 06/28/23 2250 06/28/23 2349 06/29/23 0544 06/29/23 1718  WBC 9.6   < > 6.8  --  9.4  --  11.7*  --   --  10.4 10.3  NEUTROABS 6.6  --   --   --   --   --  9.8*  --   --   --   --   HGB 12.8*   < > 10.3*   < > 8.1*   < > 9.2*   < > 8.8* 9.0* 7.9*  HCT 38.7*   < > 30.5*   < > 23.9*   < > 27.9*   < > 26.0* 27.0* 24.7*  MCV 97.2   < > 95.3  --  97.2  --  97.9  --   --  97.5 101.6*  PLT 177   < > 130*   < > 89*  --  127*  --   --  142* 113*   < > = values in this interval not displayed.   Cardiac Enzymes: No results for input(s): CKTOTAL, CKMB, CKMBINDEX, TROPONINI in the last 168 hours. CBG: Recent Labs  Lab 06/30/23 0630 06/30/23 1152 06/30/23 1648 06/30/23 2119 07/01/23 0609  GLUCAP 101* 115* 153* 124* 83    Iron  Studies: No results for input(s): IRON , TIBC, TRANSFERRIN, FERRITIN in the last 72 hours. Studies/Results: DG CHEST PORT 1 VIEW Result Date: 06/30/2023 CLINICAL DATA:  Pleural effusion, chest pain EXAM: PORTABLE CHEST 1 VIEW COMPARISON:  Chest x-ray 06/29/2023 FINDINGS: Sternotomy wires are again seen. The heart is enlarged, unchanged. There central pulmonary vascular congestion. There are atelectatic changes in the lung bases. There is a small left pleural  effusion. Lung volumes are low. There is no pneumothorax or acute fracture. IMPRESSION: 1. Cardiomegaly with central pulmonary vascular congestion. 2. Small left pleural effusion. 3. Low lung volumes with bibasilar atelectasis. Electronically Signed   By: Greig Pique M.D.   On: 06/30/2023 15:27    acetaminophen   1,000 mg Oral Q6H   Or   acetaminophen  (TYLENOL ) oral liquid 160 mg/5 mL  1,000 mg Per Tube Q6H   aspirin  EC  325 mg Oral Daily   Or   aspirin   324 mg Per Tube Daily   atorvastatin   80 mg Oral Daily   bisacodyl   10 mg Oral Daily   Or   bisacodyl   10 mg Rectal Daily   calcium  acetate  2,001 mg Oral TID with meals   Chlorhexidine  Gluconate Cloth  6  each Topical Q0600   docusate sodium   200 mg Oral Daily   heparin  injection (subcutaneous)  5,000 Units Subcutaneous Q8H   insulin  aspart  0-15 Units Subcutaneous TID WC   insulin  glargine-yfgn  7 Units Subcutaneous BID   metoprolol  tartrate  12.5 mg Oral BID   Or   metoprolol  tartrate  12.5 mg Per Tube BID   pantoprazole   40 mg Oral Daily   sodium chloride  flush  3 mL Intravenous Q12H   sodium chloride  flush  3 mL Intravenous Q12H   sorbitol   30 mL Oral Once    BMET    Component Value Date/Time   NA 135 06/30/2023 0513   NA 141 01/16/2019 1517   K 4.9 06/30/2023 0513   K 4.9 06/30/2023 0513   CL 95 (L) 06/30/2023 0513   CO2 26 06/30/2023 0513   GLUCOSE 97 06/30/2023 0513   BUN 31 (H) 06/30/2023 0513   BUN 62 (H) 01/16/2019 1517   CREATININE 10.58 (H) 06/30/2023 0513   CALCIUM  8.5 (L) 06/30/2023 0513   GFRNONAA 5 (L) 06/30/2023 0513   GFRAA  09/12/2022 0330    SPECIMEN HEMOLYZED. HEMOLYSIS MAY AFFECT INTEGRITY OF RESULTS.   CBC    Component Value Date/Time   WBC 10.3 06/29/2023 1718   RBC 2.43 (L) 06/29/2023 1718   HGB 7.9 (L) 06/29/2023 1718   HGB 9.5 (L) 01/16/2019 1517   HCT 24.7 (L) 06/29/2023 1718   HCT 29.1 (L) 01/16/2019 1517   PLT 113 (L) 06/29/2023 1718   PLT 189 01/16/2019 1517   MCV 101.6 (H)  06/29/2023 1718   MCV 92 01/16/2019 1517   MCH 32.5 06/29/2023 1718   MCHC 32.0 06/29/2023 1718   RDW 17.2 (H) 06/29/2023 1718   RDW 14.9 01/16/2019 1517   LYMPHSABS 0.6 (L) 06/28/2023 1909   LYMPHSABS 1.0 01/16/2019 1517   MONOABS 1.1 (H) 06/28/2023 1909   EOSABS 0.1 06/28/2023 1909   EOSABS 0.2 01/16/2019 1517   BASOSABS 0.0 06/28/2023 1909   BASOSABS 0.0 01/16/2019 1517    Dialysis Orders: Outpatient dialysis orders: [facility] MWF 180NRe 4 hours BFR 450 DFR Auto 1.5 EDW 102.3kg 2K 2Ca AVF 15g No heparin  Mircera 50mcg IV q 2 weeks- last dose 06/19/23 Calcitriol  1.5 mcg PO q HD   Assessment/Plan:  NSTEMI: 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 On IV heparin  and nitroglycerin . 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  Had hyperkalemia post operatively but improved after urgent dialysis. Currently resting comfortably without new complaints.  Management per admitting team/cardiology  ESRD:  Typically on MWF schedule, off schedule this week due to holiday/missed HD on Sunday. HD delayed  Plan to continue his outpatient schedule on Monday.  Hypertension/volume: BP on low side following surgery.  Well above his edw, however no pulmonary issues.   Will need to slowly get back towards his EDW.  UF as tolerated. Continue current medications as per cardiology. Not in respiratory distress, no edema on exam.   Anemia: Hgb 7.9 following surgery. ESA recently dosed, continue to monitor  Metabolic bone disease: Calcium  controlled, phos elevated. Continue VDRA and binders  Nutrition:  Alb 3.6, will need renal diet/protein supplements once tolerating PO  Fairy RONAL Sellar, MD [facility]

## 2023-07-02 ENCOUNTER — Inpatient Hospital Stay (HOSPITAL_COMMUNITY): Payer: Medicare Other

## 2023-07-02 DIAGNOSIS — I214 Non-ST elevation (NSTEMI) myocardial infarction: Secondary | ICD-10-CM | POA: Diagnosis not present

## 2023-07-02 DIAGNOSIS — I739 Peripheral vascular disease, unspecified: Secondary | ICD-10-CM | POA: Diagnosis not present

## 2023-07-02 DIAGNOSIS — Z951 Presence of aortocoronary bypass graft: Secondary | ICD-10-CM | POA: Diagnosis not present

## 2023-07-02 DIAGNOSIS — I10 Essential (primary) hypertension: Secondary | ICD-10-CM | POA: Diagnosis not present

## 2023-07-02 LAB — BPAM RBC
Blood Product Expiration Date: 202502012359
ISSUE DATE / TIME: 202501051322
Unit Type and Rh: 5100

## 2023-07-02 LAB — RENAL FUNCTION PANEL
Albumin: 2.9 g/dL — ABNORMAL LOW (ref 3.5–5.0)
Anion gap: 15 (ref 5–15)
BUN: 53 mg/dL — ABNORMAL HIGH (ref 6–20)
CO2: 26 mmol/L (ref 22–32)
Calcium: 8.8 mg/dL — ABNORMAL LOW (ref 8.9–10.3)
Chloride: 95 mmol/L — ABNORMAL LOW (ref 98–111)
Creatinine, Ser: 14.7 mg/dL — ABNORMAL HIGH (ref 0.61–1.24)
GFR, Estimated: 4 mL/min — ABNORMAL LOW (ref >60)
Glucose, Bld: 101 mg/dL — ABNORMAL HIGH (ref 70–99)
Phosphorus: 8.4 mg/dL — ABNORMAL HIGH (ref 2.5–4.6)
Potassium: 4.7 mmol/L (ref 3.5–5.1)
Sodium: 136 mmol/L (ref 135–145)

## 2023-07-02 LAB — GLUCOSE, CAPILLARY
Glucose-Capillary: 103 mg/dL — ABNORMAL HIGH (ref 70–99)
Glucose-Capillary: 78 mg/dL (ref 70–99)
Glucose-Capillary: 111 mg/dL — ABNORMAL HIGH (ref 70–99)
Glucose-Capillary: 82 mg/dL (ref 70–99)

## 2023-07-02 LAB — TYPE AND SCREEN
ABO/RH(D): O POS
Antibody Screen: NEGATIVE
Unit division: 0

## 2023-07-02 LAB — CBC
HCT: 24.1 % — ABNORMAL LOW (ref 39.0–52.0)
Hemoglobin: 7.8 g/dL — ABNORMAL LOW (ref 13.0–17.0)
MCH: 31.6 pg (ref 26.0–34.0)
MCHC: 32.4 g/dL (ref 30.0–36.0)
MCV: 97.6 fL (ref 80.0–100.0)
Platelets: 124 10*3/uL — ABNORMAL LOW (ref 150–400)
RBC: 2.47 MIL/uL — ABNORMAL LOW (ref 4.22–5.81)
RDW: 19.3 % — ABNORMAL HIGH (ref 11.5–15.5)
WBC: 10.2 10*3/uL (ref 4.0–10.5)
nRBC: 0 % (ref 0.0–0.2)

## 2023-07-02 MED ORDER — PROSOURCE PLUS PO LIQD
30.0000 mL | Freq: Two times a day (BID) | ORAL | Status: DC
  Administered 2023-07-02 – 2023-07-08 (×9): 30 mL via ORAL
  Filled 2023-07-02 (×9): qty 30

## 2023-07-02 MED ORDER — METOPROLOL TARTRATE 25 MG/10 ML ORAL SUSPENSION: 12.5000 mg | Freq: Three times a day (TID) | ORAL | Status: DC

## 2023-07-02 MED ORDER — METOPROLOL TARTRATE 25 MG PO TABS
25.0000 mg | ORAL_TABLET | Freq: Three times a day (TID) | ORAL | Status: DC
  Administered 2023-07-02 – 2023-07-03 (×3): 25 mg via ORAL
  Filled 2023-07-02 (×3): qty 1

## 2023-07-02 MED ORDER — HYDRALAZINE HCL 25 MG PO TABS: 25.0000 mg | ORAL_TABLET | Freq: Four times a day (QID) | ORAL | Status: DC | PRN

## 2023-07-02 MED ORDER — DARBEPOETIN ALFA 100 MCG/0.5ML IJ SOSY
100.0000 ug | PREFILLED_SYRINGE | INTRAMUSCULAR | Status: DC
  Administered 2023-07-02: 100 ug via SUBCUTANEOUS
  Filled 2023-07-02 (×2): qty 0.5

## 2023-07-02 MED FILL — Potassium Chloride Inj 2 mEq/ML: INTRAVENOUS | Qty: 40 | Status: AC

## 2023-07-02 MED FILL — Heparin Sodium (Porcine) Inj 1000 Unit/ML: Qty: 1000 | Status: AC

## 2023-07-02 MED FILL — Lidocaine HCl Local Preservative Free (PF) Inj 2%: INTRAMUSCULAR | Qty: 14 | Status: AC

## 2023-07-02 NOTE — Plan of Care (Signed)
  Problem: Fluid Volume: Goal: Ability to maintain a balanced intake and output will improve Outcome: Progressing   Problem: Coping: Goal: Ability to adjust to condition or change in health will improve Outcome: Progressing   Problem: Education: Goal: Individualized Educational Video(s) Outcome: Progressing   Problem: Skin Integrity: Goal: Risk for impaired skin integrity will decrease Outcome: Progressing

## 2023-07-02 NOTE — Progress Notes (Signed)
 PT Cancellation Note  Patient Details Name: Corey Park MRN: [id number] DOB: [birth year]   Cancelled Treatment:    Reason Eval/Treat Not Completed: Fatigue/lethargy limiting ability to participate; attempted to see pt after dialysis.  Noted pt with fatigue, confusion, lethargy and diaphoretic.  Assisted pt to eat fruit cup and drink lemonade.  RN aware.  Son in the room.  Will follow up.    Montie Portal 07/02/2023, 5:06 PM Micheline Portal, PT Acute Rehabilitation Services Office:[phone] 07/02/2023

## 2023-07-02 NOTE — Progress Notes (Signed)
 Pt receives out-pt HD at [facility] on MWF. Will assist as needed.   Olivia Canter Renal Navigator [phone]

## 2023-07-02 NOTE — Progress Notes (Signed)
 PT Cancellation Note  Patient Details Name: Corey Park MRN: [id number] DOB: 03/05/1971   Cancelled Treatment:    Reason Eval/Treat Not Completed: Patient at procedure or test/unavailable; will attempt again following HD.    Montie Portal 07/02/2023, 9:33 AM Micheline Portal, PT Acute Rehabilitation Services [phone] 07/02/2023

## 2023-07-02 NOTE — Progress Notes (Signed)
 [facility] Progress Note   Subjective:   Seen on HD - tired/lethargic. 3L UFG and tolerating.  Objective Vitals:   07/01/23 2335 07/02/23 0424 07/02/23 0815 07/02/23 0830  BP: 136/72 (!) 163/85 (!) 152/84 (!) 195/92  Pulse: 95 92    Resp: 20 14 17 18   Temp: 98.9 F (37.2 C) 98.8 F (37.1 C) 98.5 F (36.9 C)   TempSrc: Axillary Axillary    SpO2: 93% 97% 98% 98%  Weight:  107.9 kg 106.6 kg   Height:       Physical Exam General: Lethargic man, NAD. Nasal O2 in place. Heart: RRR; no murmur. Healing midline incision Lungs: CTA anteriorly Abdomen:soft, ?distended Extremities: no LE edema Dialysis Access:  RUE AVF +t/b  Additional Objective Labs: Basic Metabolic Panel: Recent Labs  Lab 06/30/23 0513 07/01/23 0758 07/02/23 0255  NA 135 136  135 136  K 4.9  4.9 4.8  4.8 4.7  CL 95* 93*  94* 95*  CO2 26 22  23 26   GLUCOSE 97 71  75 101*  BUN 31* 44*  43* 53*  CREATININE 10.58* 13.41*  13.22* 14.70*  CALCIUM  8.5* 8.8*  8.6* 8.8*  PHOS 7.8* 8.7* 8.4*   Liver Function Tests: Recent Labs  Lab 06/30/23 0513 07/01/23 0758 07/02/23 0255  ALBUMIN  3.3* 3.0* 2.9*   CBC: Recent Labs  Lab 06/28/23 1909 06/28/23 2250 06/29/23 0544 06/29/23 1718 07/01/23 0758 07/02/23 0708  WBC 11.7*  --  10.4 10.3 10.1 10.2  NEUTROABS 9.8*  --   --   --   --   --   HGB 9.2*   < > 9.0* 7.9* 6.7* 7.8*  HCT 27.9*   < > 27.0* 24.7* 20.7* 24.1*  MCV 97.9  --  97.5 101.6* 101.5* 97.6  PLT 127*  --  142* 113* 105* 124*   < > = values in this interval not displayed.   CBG: Recent Labs  Lab 07/01/23 0609 07/01/23 1110 07/01/23 1558 07/01/23 2103 07/02/23 0554  GLUCAP 83 90 139* 117* 103*   Studies/Results: DG Chest 2 View Result Date: 07/02/2023 CLINICAL DATA:  Status post CABG on 06/28/2023.  Pleural effusion. EXAM: CHEST - 2 VIEW COMPARISON:  Chest radiograph dated June 30, 2023. FINDINGS: Low lung volumes. Stable cardiomegaly. Status post median  sternotomy and CABG. Bilateral interstitial prominence, suggestive of pulmonary vascular congestion. Small left-greater-than-right pleural effusions with associated basilar atelectasis. No pneumothorax. No acute osseous abnormality. IMPRESSION: 1. Small left-greater-than-right pleural effusions with associated basilar atelectasis. 2. Low lung volumes. Cardiomegaly with pulmonary vascular congestion. Electronically Signed   By: Harrietta Sherry M.D.   On: 07/02/2023 08:23   DG CHEST PORT 1 VIEW Result Date: 06/30/2023 CLINICAL DATA:  Pleural effusion, chest pain EXAM: PORTABLE CHEST 1 VIEW COMPARISON:  Chest x-ray 06/29/2023 FINDINGS: Sternotomy wires are again seen. The heart is enlarged, unchanged. There central pulmonary vascular congestion. There are atelectatic changes in the lung bases. There is a small left pleural effusion. Lung volumes are low. There is no pneumothorax or acute fracture. IMPRESSION: 1. Cardiomegaly with central pulmonary vascular congestion. 2. Small left pleural effusion. 3. Low lung volumes with bibasilar atelectasis. Electronically Signed   By: Greig Pique M.D.   On: 06/30/2023 15:27   Medications:   acetaminophen   1,000 mg Oral Q6H   Or   acetaminophen  (TYLENOL ) oral liquid 160 mg/5 mL  1,000 mg Per Tube Q6H   aspirin  EC  325 mg Oral Daily   Or  aspirin   324 mg Per Tube Daily   atorvastatin   80 mg Oral Daily   bisacodyl   10 mg Oral Daily   Or   bisacodyl   10 mg Rectal Daily   calcium  acetate  2,001 mg Oral TID with meals   Chlorhexidine  Gluconate Cloth  6 each Topical Q0600   docusate sodium   200 mg Oral Daily   heparin  injection (subcutaneous)  5,000 Units Subcutaneous Q8H   insulin  aspart  0-15 Units Subcutaneous TID WC   insulin  glargine-yfgn  7 Units Subcutaneous BID   metoprolol  tartrate  12.5 mg Oral BID   Or   metoprolol  tartrate  12.5 mg Per Tube BID   pantoprazole   40 mg Oral Daily   sodium chloride  flush  3 mL Intravenous Q12H   sodium chloride   flush  3 mL Intravenous Q12H    Dialysis Orders MWF - [facility] 4hr, 450/A1.5, EDW 102.3kg, 2K/2Ca, AVF, no heparin  - Mircera 50mcg IV q 2 weeks (last 12/24) - Calcitriol  1.16mcg PO q HD  Assessment/Plan: NSTEMI/CAD -> CABG x 3 on 06/28/23: C/b hyperK post-op, improved with HD. ESRD: Continue HD on MWF schedule - HD now, 3L UFG. HTN/volume: BP high, imaging with pulm edema - UF as tolerated. 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 Secondary HPTH: Ca fine, Phos high - wasn't getting binder consistently, now better - follow. Nutrition: Alb low, adding protein supps.   Izetta Boehringer, PA-C 07/02/2023, 9:01 AM  [employer]

## 2023-07-02 NOTE — Progress Notes (Signed)
 Patient went for the hemodialysis around 8am today.

## 2023-07-02 NOTE — Plan of Care (Signed)
  Problem: Coping: Goal: Ability to adjust to condition or change in health will improve Outcome: Progressing   Problem: Fluid Volume: Goal: Ability to maintain a balanced intake and output will improve Outcome: Progressing   Problem: Metabolic: Goal: Ability to maintain appropriate glucose levels will improve Outcome: Progressing   Problem: Skin Integrity: Goal: Risk for impaired skin integrity will decrease Outcome: Progressing   Problem: Activity: Goal: Ability to return to baseline activity level will improve Outcome: Progressing   Problem: Coping: Goal: Level of anxiety will decrease Outcome: Progressing   Problem: Elimination: Goal: Will not experience complications related to urinary retention Outcome: Progressing   Problem: Pain Management: Goal: General experience of comfort will improve Outcome: Progressing   Problem: Health Behavior/Discharge Planning: Goal: Ability to identify and utilize available resources and services will improve Outcome: Not Progressing Goal: Ability to manage health-related needs will improve Outcome: Not Progressing   Problem: Nutritional: Goal: Progress toward achieving an optimal weight will improve Outcome: Not Progressing   Problem: Clinical Measurements: Goal: Respiratory complications will improve Outcome: Not Progressing   Problem: Activity: Goal: Risk for activity intolerance will decrease Outcome: Not Progressing

## 2023-07-02 NOTE — Progress Notes (Addendum)
 Patient Name: Corey Park Date of Encounter: 07/02/2023 [facility] Cardiologist: Oneil Parchment, MD  06/25/2023 .admit Length of stay: 7  Interval Summary  .    Corey Park is a 53 y.o. African-American male patient with hypertension, diabetes mellitus with end-stage renal disease on hemodialysis, hypercalcemia admitted to the hospital on 06/25/2023 with NSTEMI, cardiac catheterization revealing severe multivessel disease needing CABG on 06/28/2023.  Physical Exam    Vitals:   07/02/23 0915 07/02/23 0930 07/02/23 1000 07/02/23 1030  BP: (!) 181/94 (!) 176/102 (!) 168/90 (!) 181/79  Pulse: 98 95 97 95  Resp: 18 (!) 33 15 15  Temp:      TempSrc:      SpO2: 99% 97% 100% 99%  Weight:      Height:       Physical Exam Neck:     Vascular: No carotid bruit or JVD.  Cardiovascular:     Rate and Rhythm: Regular rhythm. Tachycardia present.     Pulses: Intact distal pulses.          Popliteal pulses are 0 on the right side and 0 on the left side.       Dorsalis pedis pulses are 0 on the right side and 0 on the left side.     Heart sounds: Heart sounds are distant. No murmur heard.    No gallop.     Comments: Right upper extremity AV shunt for dialysis noted. Pulmonary:     Effort: Pulmonary effort is normal.     Breath sounds: Normal breath sounds.  Abdominal:     General: Bowel sounds are normal.     Palpations: Abdomen is soft.  Musculoskeletal:     Right lower leg: No edema.     Left lower leg: No edema.     Intake/Output Summary (Last 24 hours) at 07/02/2023 1106 Last data filed at 07/01/2023 2000 Gross per 24 hour  Intake 675 ml  Output --  Net 675 ml      07/02/2023    8:15 AM 07/02/2023    4:24 AM 07/01/2023    6:00 AM  Last 3 Weights  Weight (lbs) 235 lb 0.2 oz 237 lb 14 oz 243 lb 6.2 oz  Weight (kg) 106.6 kg 107.9 kg 110.4 kg      Radiology    Labs   Lab Results  Component Value Date   NA 136 07/02/2023   K 4.7 07/02/2023   CO2 26  07/02/2023   GLUCOSE 101 (H) 07/02/2023   BUN 53 (H) 07/02/2023   CREATININE 14.70 (H) 07/02/2023   CALCIUM  8.8 (L) 07/02/2023   GFRNONAA 4 (L) 07/02/2023       Latest Ref Rng & Units 07/02/2023    2:55 AM 07/01/2023    7:58 AM 06/30/2023    5:13 AM  BMP  Glucose 70 - 99 mg/dL 898  75    71  97   BUN 6 - 20 mg/dL 53  43    44  31   Creatinine 0.61 - 1.24 mg/dL 85.29  86.77    86.58  10.58   Sodium 135 - 145 mmol/L 136  135    136  135   Potassium 3.5 - 5.1 mmol/L 4.7  4.8    4.8  4.9    4.9   Chloride 98 - 111 mmol/L 95  94    93  95   CO2 22 - 32 mmol/L 26  23    22  26   Calcium  8.9 - 10.3 mg/dL 8.8  8.6    8.8  8.5        Latest Ref Rng & Units 07/02/2023    7:08 AM 07/01/2023    7:58 AM 06/29/2023    5:18 PM  CBC  WBC 4.0 - 10.5 K/uL 10.2  10.1  10.3   Hemoglobin 13.0 - 17.0 g/dL 7.8  6.7  7.9   Hematocrit 39.0 - 52.0 % 24.1  20.7  24.7   Platelets 150 - 400 K/uL 124  105  113     Lab Results  Component Value Date   CHOL 160 12/11/2022   HDL 31 (L) 12/11/2022   LDLCALC 107 (H) 12/11/2022   TRIG 111 12/11/2022   CHOLHDL 5.2 12/11/2022    Lab Results  Component Value Date   TSH 0.826 12/12/2016    Lab Results  Component Value Date   HGBA1C 5.8 (H) 06/26/2023    BNP (last 3 results) Recent Labs    02/09/23 0856 06/25/23 0203 06/25/23 0448  BNP 282.1* 620.3* 480.0*    Tele/EKG/Cardiac studies    Telemetry: NSR, No NSVT, no significant arrhythmias  EKG:  EKG 06/29/2023: Normal sinus rhythm/sinus tachycardia at rate of 103 bpm, left atrial enlargement, left anterior fascicular block.  LVH.  ST-T abnormality, lateral subendocardial infarct versus ischemia.  Coronary angiogram 06/25/2023:   Prox RCA lesion is 80% stenosed.   Ost Cx to Mid Cx lesion is 99% stenosed.   Prox LAD lesion is 95% stenosed.   Mid LAD lesion is 50% stenosed.   Mid LM to Ost LAD lesion is 50% stenosed.    Echocardiogram 06/25/2023:  1. Left ventricular ejection fraction,  by estimation, is 40 to 45%. The left ventricle has mildly decreased function. The left ventricle demonstrates regional wall motion abnormalities. The inferior wall and posterior wall are hypokinetic. Left ventricular diastolic parameters are consistent with Grade II diastolic dysfunction (pseudonormalization). GLS -8.6%.  2. Right ventricular systolic function is normal. The right ventricular size is normal.  3. The mitral valve is normal in structure. No evidence of mitral valve regurgitation. No evidence of mitral stenosis.  4. The aortic valve is normal in structure. Aortic valve regurgitation is not visualized. No aortic stenosis is present.  5. The inferior vena cava is normal in size with greater than 50% respiratory variability, suggesting right atrial pressure of 3 mmHg.  CABG 06/28/2023: Severe multivessel CAD. S/p CABGX3 (LIMA-LAD, R SVG-distal RCA, OM)  Current Facility-Administered Medications:    (feeding supplement) PROSource Plus liquid 30 mL, 30 mL, Oral, BID BM, Stovall, Kathryn R, PA-C   acetaminophen  (TYLENOL ) tablet 1,000 mg, 1,000 mg, Oral, Q6H, 1,000 mg at 07/02/23 0549 **OR** acetaminophen  (TYLENOL ) 160 MG/5ML solution 1,000 mg, 1,000 mg, Per Tube, Q6H, Gold, Wayne E, PA-C   aspirin  EC tablet 325 mg, 325 mg, Oral, Daily, 325 mg at 07/01/23 0920 **OR** aspirin  chewable tablet 324 mg, 324 mg, Per Tube, Daily, Gold, Wayne E, PA-C   atorvastatin  (LIPITOR ) tablet 80 mg, 80 mg, Oral, Daily, Gold, Wayne E, PA-C, 80 mg at 07/01/23 0920   bisacodyl  (DULCOLAX) EC tablet 10 mg, 10 mg, Oral, Daily, 10 mg at 07/01/23 0920 **OR** bisacodyl  (DULCOLAX) suppository 10 mg, 10 mg, Rectal, Daily, Gold, Wayne E, PA-C   calcium  acetate (PHOSLO ) capsule 2,001 mg, 2,001 mg, Oral, TID with meals, Gold, Wayne E, PA-C, 2,001 mg at 07/01/23 1724   calcium  carbonate (TUMS - dosed in mg elemental calcium ) chewable tablet  400 mg of elemental calcium , 2 tablet, Oral, TID PRN, Gold, Wayne E, PA-C, 400 mg of  elemental calcium  at 06/26/23 2219   Chlorhexidine  Gluconate Cloth 2 % PADS 6 each, 6 each, Topical, Q0600, Macel Jayson PARAS, MD, 6 each at 07/02/23 [phone]   Darbepoetin Alfa  (ARANESP ) injection 100 mcg, 100 mcg, Subcutaneous, Q Mon-1800, Stovall, Kathryn R, PA-C   dextrose  50 % solution 0-50 mL, 0-50 mL, Intravenous, PRN, Gold, Wayne E, PA-C   docusate sodium  (COLACE) capsule 200 mg, 200 mg, Oral, Daily, Gold, Wayne E, PA-C, 200 mg at 07/01/23 0920   fentaNYL  (SUBLIMAZE ) injection 25-50 mcg, 25-50 mcg, Intravenous, Q2H PRN, Gretta Leita SQUIBB, DO, 50 mcg at 06/30/23 9164   heparin  injection 5,000 Units, 5,000 Units, Subcutaneous, Q8H, Gretta Leita SQUIBB, DO, 5,000 Units at 07/02/23 9450   insulin  aspart (novoLOG ) injection 0-15 Units, 0-15 Units, Subcutaneous, TID WC, Gretta Leita SQUIBB, DO, 2 Units at 07/01/23 1725   insulin  glargine-yfgn (SEMGLEE ) injection 7 Units, 7 Units, Subcutaneous, BID, Gretta Leita SQUIBB, DO, 7 Units at 07/01/23 2001   metoprolol  tartrate (LOPRESSOR ) tablet 12.5 mg, 12.5 mg, Oral, BID, 12.5 mg at 07/01/23 2001 **OR** metoprolol  tartrate (LOPRESSOR ) 25 mg/10 mL oral suspension 12.5 mg, 12.5 mg, Per Tube, BID, Gold, Wayne E, PA-C   metoprolol  tartrate (LOPRESSOR ) injection 2.5-5 mg, 2.5-5 mg, Intravenous, Q2H PRN, Gold, Wayne E, PA-C   ondansetron  (ZOFRAN ) injection 4 mg, 4 mg, Intravenous, Q6H PRN, Gold, Wayne E, PA-C, 4 mg at 06/30/23 1319   oxyCODONE  (Oxy IR/ROXICODONE ) immediate release tablet 5-10 mg, 5-10 mg, Oral, Q3H PRN, Gold, Wayne E, PA-C, 10 mg at 06/30/23 1445   pantoprazole  (PROTONIX ) EC tablet 40 mg, 40 mg, Oral, Daily, Gold, Wayne E, PA-C, 40 mg at 07/01/23 0920   sodium chloride  flush (NS) 0.9 % injection 3 mL, 3 mL, Intravenous, Q12H, Gold, Wayne E, PA-C, 3 mL at 07/01/23 2001   sodium chloride  flush (NS) 0.9 % injection 3 mL, 3 mL, Intravenous, PRN, Gold, Wayne E, PA-C   sodium chloride  flush (NS) 0.9 % injection 3 mL, 3 mL, Intravenous, Q12H, Lightfoot, Harrell O, MD, 3  mL at 07/01/23 2001   sodium chloride  flush (NS) 0.9 % injection 3 mL, 3 mL, Intravenous, PRN, Lightfoot, Harrell O, MD   traMADol  (ULTRAM ) tablet 50-100 mg, 50-100 mg, Oral, Q4H PRN, Gold, Wayne E, PA-C, 100 mg at 06/30/23 0740   Assessment & Plan .     Marquavis Hannen is a 53 y.o. African-American male patient with diabetes mellitus and associated complications including diabetic retinopathy, end-stage renal disease on hemodialysis, hypertension, hypercholesterolemia presenting with NSTEMI SP CABG on 06/28/2023.  Patient seen during dialysis, no chest pain or dyspnea.  1.  NSTEMI 2.  CAD of native vessel with NSTEMI. 3.  Severe multivessel three-vessel coronary disease including left main disease SP CABG with (LIMA-LAD, R SVG-distal RCA, OM) 4.  Primary hypertension 5.  Hyperglycemia, resolution of diabetes mellitus with onset of renal failure. 6.  Peripheral arterial disease as evidenced by abnormal lower extremity arterial duplex revealing small vessel disease.  Recommendation: Patient remains tachycardic, blood pressure is elevated as well.  Discussed with dialysis staff, no issues with hemodynamics.  In view of elevated heart rate, sinus tachycardia, elevated blood pressure, will increase metoprolol  to tartrate from 12.5 mg twice daily to 25 mg 3 times daily and if he tolerates this, will increase to 50 mg twice daily tomorrow.  No ARB or ACE inhibitor in view of hyperkalemia and renal  failure.  Patient is on appropriately on high intensity statins, not on antidiabetic medications in view of resolution of diabetes with onset of renal dysfunction and A1c is 5.8% making him hyperglycemia only and not diabetes.  Although has had NSTEMI, in view of chronic anemia, thrombocytopenia, will hold off on starting Plavix  along with aspirin , continue aspirin  alone.   For questions or updates, please contact [facility] Please consult [email or web address] for contact info under         Signed, Gordy Bergamo, MD, [facility] 07/02/2023, 11:06 AM [facility] [address] [place], [place] [address] Phone: [phone]. Fax:  [phone]  Cell: [phone]

## 2023-07-02 NOTE — Progress Notes (Addendum)
      [address]       [place] [address]             [phone]        4 Days Post-Op Procedure(s) (LRB): CORONARY ARTERY BYPASS GRAFTING TIMES THREE USING LEFT INTERNAL MAMMARY ARTERY AND RIGHT GREAT SAPHENOUS VEIN HARVESTED ENDOSCOPICALLY (N/A) TRANSESOPHAGEAL ECHOCARDIOGRAM (TEE) (N/A)  Subjective: Patient has no specific complaint this am. Patient with bowel movement.  Objective: Vital signs in last 24 hours: Temp:  [98.1 F (36.7 C)-98.9 F (37.2 C)] 98.8 F (37.1 C) (01/06 0424) Pulse Rate:  [48-95] 92 (01/06 0424) Cardiac Rhythm: Normal sinus rhythm (01/05 2023) Resp:  [13-24] 14 (01/06 0424) BP: (103-163)/(65-111) 163/85 (01/06 0424) SpO2:  [93 %-100 %] 97 % (01/06 0424) Weight:  [107.9 kg] 107.9 kg (01/06 0424)  Pre op weight 103.5 kg Current Weight  07/02/23 107.9 kg      Intake/Output from previous day: 01/05 0701 - 01/06 0700 In: 915 [P.O.:600; Blood:315] Out: -    Physical Exam:  Cardiovascular: RRR Pulmonary: Slightly diminished bibasilar breath sounds Abdomen: Soft, non tender, bowel sounds present. Extremities: Mild bilateral lower extremity edema. RUE with bruit and thrill, aneursymal Wounds: Sternal wound is clean and dry.  No erythema or signs of infection. RLE dressing removed and wound is clean and dry. Neurologic: Moving all extremities  Lab Results: CBC: Recent Labs    06/29/23 1718 07/01/23 0758  WBC 10.3 10.1  HGB 7.9* 6.7*  HCT 24.7* 20.7*  PLT 113* 105*   BMET:  Recent Labs    07/01/23 0758 07/02/23 0255  NA 136  135 136  K 4.8  4.8 4.7  CL 93*  94* 95*  CO2 22  23 26   GLUCOSE 71  75 101*  BUN 44*  43* 53*  CREATININE 13.41*  13.22* 14.70*  CALCIUM  8.8*  8.6* 8.8*    PT/INR:  Lab Results  Component Value Date   INR 1.4 (H) 06/28/2023   ABG:  INR: Will add last result for INR, ABG once components are confirmed Will add last 4 CBG results once components are  confirmed  Assessment/Plan:  1. CV - S/p NSTEMI. SR. On Lopressor  12.5 mg bid. Will start Plavix  (ACS) and decrease ec asa closer to discharge. 2.  Pulmonary - On 2 L oxygen via [place]. Wean as able. Per nurse, desaturation with ambulation so will likely need oxygen when ready for discharge. PA/LAT CXR this am appears to show low lung volumes,cardiomegaly, pulmonary vascular congestion, small pleural effusion and bibasilar atelectasis. Encourage incentive spirometer. 3. ESRD-Creatinine this am 14.7. HD days prior to surgery were Mon/Wed/Fri. HD likely today. Nephrology following and appears will have HD in am 4.  Anemia of chronic disease - H and H yesterday 6.7 and 20.7. He received one unit PRBC. Await this am's H and H 5. DM-CBGs 139/117/103. On Insulin . Pre op HGA1C 5.8. 6. Deconditioned-PT/OT 7. Disposition-patient legally blind. CIR to evaluate  Donielle M ZimmermanPA-C 7:00 AM  Agree with above Dispo planning  Sandrea Boer O Ceriah Kohler

## 2023-07-02 NOTE — Procedures (Signed)
 Received patient in bed to unit.  Alert and oriented.  Informed consent signed and in chart.   TX duration:3 hours  Patient tolerated well.  Transported back to the room  Alert, without acute distress.  Hand-off given to patient's nurse.   Access used: ravf Access issues: none  Total UF removed: 3 liters Medication(s) given: none   Greig Silvan, RN [facility]

## 2023-07-02 NOTE — Progress Notes (Signed)
 Patient has been received from the hemodialysis, V/S taken, CCMD notified, Call bell in reach, tried to fed him his lunch, patient refused to eat, some fluids given

## 2023-07-03 DIAGNOSIS — I25700 Atherosclerosis of coronary artery bypass graft(s), unspecified, with unstable angina pectoris: Secondary | ICD-10-CM | POA: Diagnosis not present

## 2023-07-03 DIAGNOSIS — D696 Thrombocytopenia, unspecified: Secondary | ICD-10-CM

## 2023-07-03 DIAGNOSIS — I214 Non-ST elevation (NSTEMI) myocardial infarction: Secondary | ICD-10-CM | POA: Diagnosis not present

## 2023-07-03 DIAGNOSIS — I10 Essential (primary) hypertension: Secondary | ICD-10-CM | POA: Diagnosis not present

## 2023-07-03 LAB — GLUCOSE, CAPILLARY
Glucose-Capillary: 84 mg/dL (ref 70–99)
Glucose-Capillary: 103 mg/dL — ABNORMAL HIGH (ref 70–99)
Glucose-Capillary: 92 mg/dL (ref 70–99)
Glucose-Capillary: 83 mg/dL (ref 70–99)

## 2023-07-03 LAB — RENAL FUNCTION PANEL
Albumin: 2.7 g/dL — ABNORMAL LOW (ref 3.5–5.0)
Anion gap: 18 — ABNORMAL HIGH (ref 5–15)
BUN: 43 mg/dL — ABNORMAL HIGH (ref 6–20)
CO2: 24 mmol/L (ref 22–32)
Calcium: 8.8 mg/dL — ABNORMAL LOW (ref 8.9–10.3)
Chloride: 93 mmol/L — ABNORMAL LOW (ref 98–111)
Creatinine, Ser: 11.58 mg/dL — ABNORMAL HIGH (ref 0.61–1.24)
GFR, Estimated: 5 mL/min — ABNORMAL LOW (ref >60)
Glucose, Bld: 88 mg/dL (ref 70–99)
Phosphorus: 6.7 mg/dL — ABNORMAL HIGH (ref 2.5–4.6)
Potassium: 4.2 mmol/L (ref 3.5–5.1)
Sodium: 135 mmol/L (ref 135–145)

## 2023-07-03 MED ORDER — INSULIN GLARGINE-YFGN 100 UNIT/ML ~~LOC~~ SOLN
5.0000 [IU] | Freq: Two times a day (BID) | SUBCUTANEOUS | Status: DC
  Administered 2023-07-03 – 2023-07-08 (×10): 5 [IU] via SUBCUTANEOUS
  Filled 2023-07-03 (×12): qty 0.05

## 2023-07-03 MED ORDER — CARVEDILOL 12.5 MG PO TABS
12.5000 mg | ORAL_TABLET | Freq: Two times a day (BID) | ORAL | Status: DC
  Administered 2023-07-03 – 2023-07-08 (×8): 12.5 mg via ORAL
  Filled 2023-07-03 (×9): qty 1

## 2023-07-03 MED ORDER — LOSARTAN POTASSIUM 25 MG PO TABS
25.0000 mg | ORAL_TABLET | Freq: Every day | ORAL | Status: DC
  Administered 2023-07-03 – 2023-07-08 (×5): 25 mg via ORAL
  Filled 2023-07-03 (×5): qty 1

## 2023-07-03 NOTE — Progress Notes (Signed)
      [address]       [place] [address]             [phone]        5 Days Post-Op Procedure(s) (LRB): CORONARY ARTERY BYPASS GRAFTING TIMES THREE USING LEFT INTERNAL MAMMARY ARTERY AND RIGHT GREAT SAPHENOUS VEIN HARVESTED ENDOSCOPICALLY (N/A) TRANSESOPHAGEAL ECHOCARDIOGRAM (TEE) (N/A)  Subjective: Patient talking about someone in the family is going to try to hurt him this am. When asked other questions, he responded appropriately.  Objective: Vital signs in last 24 hours: Temp:  [97.9 F (36.6 C)-98.7 F (37.1 C)] 98.6 F (37 C) (01/07 0319) Pulse Rate:  [82-105] 82 (01/07 0319) Cardiac Rhythm: Normal sinus rhythm (01/06 1900) Resp:  [15-33] 18 (01/07 0319) BP: (133-198)/(76-102) 148/78 (01/07 0319) SpO2:  [93 %-100 %] 100 % (01/07 0319) Weight:  [103.6 kg-106.6 kg] 104.2 kg (01/07 0319)  Pre op weight 103.5 kg Current Weight  07/03/23 104.2 kg      Intake/Output from previous day: 01/06 0701 - 01/07 0700 In: 390 [P.O.:390] Out: 3000    Physical Exam:  Cardiovascular: RRR Pulmonary: Slightly diminished left basilar breath sounds Abdomen: Soft, non tender, bowel sounds present. Extremities: Trace bilateral lower extremity edema. RUE with bruit and thrill, aneursymal Wounds: Sternal wound is clean and dry.  No erythema or signs of infection. RLE dressing wound is clean and dry. Neurologic: Moving all extremities  Lab Results: CBC: Recent Labs    07/01/23 0758 07/02/23 0708  WBC 10.1 10.2  HGB 6.7* 7.8*  HCT 20.7* 24.1*  PLT 105* 124*   BMET:  Recent Labs    07/02/23 0255 07/03/23 0354  NA 136 135  K 4.7 4.2  CL 95* 93*  CO2 26 24  GLUCOSE 101* 88  BUN 53* 43*  CREATININE 14.70* 11.58*  CALCIUM  8.8* 8.8*    PT/INR:  Lab Results  Component Value Date   INR 1.4 (H) 06/28/2023   ABG:  INR: Will add last result for INR, ABG once components are confirmed Will add last 4 CBG results once components are  confirmed  Assessment/Plan:  1. CV - S/p NSTEMI. SR. On Lopressor  25 tid. Per cardiology, will increase to 50 mg bid if able. Will start Plavix  (ACS) and decrease ec asa closer to discharge. 2.  Pulmonary - On 2 L oxygen via [place]. Wean as able. Per nurse, desaturation with ambulation so will likely need oxygen when ready for discharge. Encourage incentive spirometer. 3. ESRD-Creatinine this am 11.58. He had HD yesterday. HD days prior to surgery were Mon/Wed/Fri. HD likely today. Nephrology following 4.  Anemia of chronic disease - H and H yesterday 6.7 and 20.7. He received one unit PRBC. Await this am's H and H 5. CBGs 111/82/84. On Insulin  but will decrease to avoid hypoglycemia. Pre op HGA1C 5.8. 6. Deconditioned-PT/OT 7. Unsure of etiology of paranoia this am;no prior history noted. Monitor 8. Disposition-patient legally blind. CIR to evaluate  Lelah Rennaker M ZimmermanPA-C 7:15 AM

## 2023-07-03 NOTE — NC FL2 (Signed)
 [place]  MEDICAID FL2 LEVEL OF CARE FORM     IDENTIFICATION  Patient Name: Kenon Delashmit Birthdate: 07/28/1970 Sex: male Admission Date (Current Location): 06/25/2023  [facility] and [place] Number:  Producer, Television/film/video and Address:  [facility], [address] [place], [place] [address]      Provider Number: [id number]  Attending Physician Name and Address:  Shyrl Linnie KIDD, MD  Relative Name and Phone Number:       Current Level of Care: Hospital Recommended Level of Care: Skilled Nursing Facility Prior Approval Number:    Date Approved/Denied:   PASRR Number: [id number]  Discharge Plan: SNF    Current Diagnoses: Patient Active Problem List   Diagnosis Date Noted   Hyperglycemia 06/29/2023   Pre-diabetes 06/29/2023   Blindness of both eyes 06/29/2023   S/P CABG x 3 06/28/2023   Hx of CABG 06/28/2023   NSTEMI (non-ST elevated myocardial infarction) (HCC) 06/25/2023   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   Obesity (BMI 30-39.9) 05/09/2023   Chest pain 05/09/2023   Acute hypoxic respiratory failure (HCC) 04/02/2023   Fluid overload 04/02/2023   Chest pain, atypical 01/03/2023   Anemia 12/12/2022   Iron  deficiency anemia due to chronic blood loss 12/11/2022   Syncope and collapse 10/19/2022   Nonspecific chest pain 09/12/2022   Myocardial injury 08/06/2022   Non-cardiac chest pain 08/05/2022   (HFpEF) heart failure with preserved ejection fraction (HCC) 08/05/2022   Need for assistance at home without other household member able to render care 08/05/2022   Adult general medical exam 07/11/2022   Pneumonia 04/17/2022   Sepsis (HCC) 04/17/2022   Acute blood loss anemia 04/17/2022   Essential hypertension 04/17/2022   Thrombocytopenia (HCC) 04/17/2022   History of GI bleed 04/17/2022   Atypical chest pain 04/17/2022   GERD (gastroesophageal reflux disease) 04/17/2022   Nausea and vomiting 04/17/2022   Chronic hiccups 04/17/2022    Pain due to onychomycosis of toenails of both feet 10/11/2021   Hypercalcemia 04/13/2020   Coagulation defect, unspecified (HCC) 08/25/2019   Secondary hyperparathyroidism of renal origin (HCC) 08/25/2019   History of anemia due to chronic kidney disease 12/23/2018   End-stage renal disease on hemodialysis (HCC)    Chronic systolic heart failure (HCC) 07/03/2018   Elevated troponin    Hypertensive urgency 12/11/2016   HLD (hyperlipidemia) 06/14/2007   DM2 (diabetes mellitus, type 2) (HCC) 06/11/2007   Hypertensive heart disease with CHF (congestive heart failure) (HCC) 06/11/2007    Orientation RESPIRATION BLADDER Height & Weight     Self, Place, Situation  Normal Continent Weight: 229 lb 11.5 oz (104.2 kg) Height:  6' 1 (185.4 cm)  BEHAVIORAL SYMPTOMS/MOOD NEUROLOGICAL BOWEL NUTRITION STATUS      Continent Diet (please see discharge summary)  AMBULATORY STATUS COMMUNICATION OF NEEDS Skin   Limited Assist Verbally Surgical wounds (closed incision chest)                       Personal Care Assistance Level of Assistance  Bathing, Feeding, Dressing Bathing Assistance: Limited assistance Feeding assistance: Independent Dressing Assistance: Limited assistance     Functional Limitations Info  Sight, Hearing, Speech Sight Info: Impaired Hearing Info: Adequate Speech Info: Adequate    SPECIAL CARE FACTORS FREQUENCY  PT (By licensed PT), OT (By licensed OT)     PT Frequency: 5x per week OT Frequency: 5x per week            Contractures Contractures Info:  Not present    Additional Factors Info  Code Status, Allergies Code Status Info: FULL Allergies Info: Imdur            Current Medications (07/03/2023):  This is the current hospital active medication list Current Facility-Administered Medications  Medication Dose Route Frequency Provider Last Rate Last Admin   (feeding supplement) PROSource Plus liquid 30 mL  30 mL Oral BID BM Stovall, Kathryn R, PA-C   30  mL at 07/03/23 1338   acetaminophen  (TYLENOL ) tablet 1,000 mg  1,000 mg Oral Q6H Gold, Wayne E, PA-C   1,000 mg at 07/03/23 1337   Or   acetaminophen  (TYLENOL ) 160 MG/5ML solution 1,000 mg  1,000 mg Per Tube Q6H Gold, Wayne E, PA-C       aspirin  EC tablet 325 mg  325 mg Oral Daily Gold, Wayne E, PA-C   325 mg at 07/03/23 9146   Or   aspirin  chewable tablet 324 mg  324 mg Per Tube Daily Gold, Wayne E, PA-C       atorvastatin  (LIPITOR ) tablet 80 mg  80 mg Oral Daily Gold, Wayne E, PA-C   80 mg at 07/03/23 9146   bisacodyl  (DULCOLAX) EC tablet 10 mg  10 mg Oral Daily Gold, Wayne E, PA-C   10 mg at 07/03/23 0853   Or   bisacodyl  (DULCOLAX) suppository 10 mg  10 mg Rectal Daily Gold, Wayne E, PA-C       calcium  acetate (PHOSLO ) capsule 2,001 mg  2,001 mg Oral TID with meals Gold, Wayne E, PA-C   2,001 mg at 07/03/23 1337   calcium  carbonate (TUMS - dosed in mg elemental calcium ) chewable tablet 400 mg of elemental calcium   2 tablet Oral TID PRN Gold, Wayne E, PA-C   400 mg of elemental calcium  at 06/26/23 2219   carvedilol  (COREG ) tablet 12.5 mg  12.5 mg Oral BID WC Henry Manuelita NOVAK, NP       Chlorhexidine  Gluconate Cloth 2 % PADS 6 each  6 each Topical Q0600 Macel Jayson PARAS, MD   6 each at 07/02/23 9352   Darbepoetin Alfa  (ARANESP ) injection 100 mcg  100 mcg Subcutaneous Q Mon-1800 Stovall, Kathryn R, PA-C   100 mcg at 07/02/23 1951   dextrose  50 % solution 0-50 mL  0-50 mL Intravenous PRN Gold, Wayne E, PA-C       docusate sodium  (COLACE) capsule 200 mg  200 mg Oral Daily Gold, Wayne E, PA-C   200 mg at 07/03/23 9146   fentaNYL  (SUBLIMAZE ) injection 25-50 mcg  25-50 mcg Intravenous Q2H PRN Gretta Leita SQUIBB, DO   50 mcg at 06/30/23 9164   heparin  injection 5,000 Units  5,000 Units Subcutaneous Q8H Gretta Leita P, DO   5,000 Units at 07/03/23 1338   hydrALAZINE  (APRESOLINE ) tablet 25 mg  25 mg Oral Q6H PRN Ladona Heinz, MD       insulin  aspart (novoLOG ) injection 0-15 Units  0-15 Units Subcutaneous  TID WC Gretta Leita SQUIBB, DO   2 Units at 07/01/23 1725   insulin  glargine-yfgn (SEMGLEE ) injection 5 Units  5 Units Subcutaneous BID Zimmerman, Donielle M, PA-C   5 Units at 07/03/23 0932   losartan  (COZAAR ) tablet 25 mg  25 mg Oral Daily Ganji, Jay, MD   25 mg at 07/03/23 1353   metoprolol  tartrate (LOPRESSOR ) injection 2.5-5 mg  2.5-5 mg Intravenous Q2H PRN Gold, Wayne E, PA-C       ondansetron  (ZOFRAN ) injection 4 mg  4 mg Intravenous Q6H  PRN Gold, Wayne E, PA-C   4 mg at 07/03/23 0016   oxyCODONE  (Oxy IR/ROXICODONE ) immediate release tablet 5-10 mg  5-10 mg Oral Q3H PRN Gold, Wayne E, PA-C   5 mg at 07/03/23 0057   pantoprazole  (PROTONIX ) EC tablet 40 mg  40 mg Oral Daily Gold, Wayne E, PA-C   40 mg at 07/03/23 9146   sodium chloride  flush (NS) 0.9 % injection 3 mL  3 mL Intravenous Q12H Gold, Wayne E, PA-C   3 mL at 07/02/23 2003   sodium chloride  flush (NS) 0.9 % injection 3 mL  3 mL Intravenous PRN Gold, Wayne E, PA-C       sodium chloride  flush (NS) 0.9 % injection 3 mL  3 mL Intravenous Q12H Lightfoot, Linnie KIDD, MD   3 mL at 07/03/23 0854   sodium chloride  flush (NS) 0.9 % injection 3 mL  3 mL Intravenous PRN Lightfoot, Harrell O, MD       traMADol  (ULTRAM ) tablet 50-100 mg  50-100 mg Oral Q4H PRN Gold, Wayne E, PA-C   50 mg at 07/03/23 0015     Discharge Medications: Please see discharge summary for a list of discharge medications.  Relevant Imaging Results:  Relevant Lab Results:   Additional Information SSN [id number] HD MWF [facility]) Chair time 3:30pm  Montie LOISE Louder, [place]

## 2023-07-03 NOTE — Progress Notes (Signed)
 [facility] Progress Note   Subjective:   Seen in room - drowsy, but wakes for questions. Some CP related to his incision, no dyspnea. No other concerns.  Objective Vitals:   07/02/23 2315 07/03/23 0319 07/03/23 0754 07/03/23 0853  BP: 133/78 (!) 148/78 (!) 158/92 (!) 150/90  Pulse: 92 82  85  Resp: (!) 24 18 18    Temp: 98.7 F (37.1 C) 98.6 F (37 C) 98.5 F (36.9 C)   TempSrc: Oral Oral Oral   SpO2: 93% 100% 98%   Weight:  104.2 kg    Height:       Physical Exam General: Lethargic man, NAD. Nasal O2 in place. Heart: RRR; no murmur. Healing midline incision Lungs: CTA anteriorly Abdomen:soft Extremities: no LE edema Dialysis Access:  RUE AVF +t/b  Additional Objective Labs: Basic Metabolic Panel: Recent Labs  Lab 07/01/23 0758 07/02/23 0255 07/03/23 0354  NA 136  135 136 135  K 4.8  4.8 4.7 4.2  CL 93*  94* 95* 93*  CO2 22  23 26 24   GLUCOSE 71  75 101* 88  BUN 44*  43* 53* 43*  CREATININE 13.41*  13.22* 14.70* 11.58*  CALCIUM  8.8*  8.6* 8.8* 8.8*  PHOS 8.7* 8.4* 6.7*   Liver Function Tests: Recent Labs  Lab 07/01/23 0758 07/02/23 0255 07/03/23 0354  ALBUMIN  3.0* 2.9* 2.7*   CBC: Recent Labs  Lab 06/28/23 1909 06/28/23 2250 06/29/23 0544 06/29/23 1718 07/01/23 0758 07/02/23 0708  WBC 11.7*  --  10.4 10.3 10.1 10.2  NEUTROABS 9.8*  --   --   --   --   --   HGB 9.2*   < > 9.0* 7.9* 6.7* 7.8*  HCT 27.9*   < > 27.0* 24.7* 20.7* 24.1*  MCV 97.9  --  97.5 101.6* 101.5* 97.6  PLT 127*  --  142* 113* 105* 124*   < > = values in this interval not displayed.   Studies/Results: DG Chest 2 View Result Date: 07/02/2023 CLINICAL DATA:  Status post CABG on 06/28/2023.  Pleural effusion. EXAM: CHEST - 2 VIEW COMPARISON:  Chest radiograph dated June 30, 2023. FINDINGS: Low lung volumes. Stable cardiomegaly. Status post median sternotomy and CABG. Bilateral interstitial prominence, suggestive of pulmonary vascular congestion. Small  left-greater-than-right pleural effusions with associated basilar atelectasis. No pneumothorax. No acute osseous abnormality. IMPRESSION: 1. Small left-greater-than-right pleural effusions with associated basilar atelectasis. 2. Low lung volumes. Cardiomegaly with pulmonary vascular congestion. Electronically Signed   By: Harrietta Sherry M.D.   On: 07/02/2023 08:23   Medications:   (feeding supplement) PROSource Plus  30 mL Oral BID BM   acetaminophen   1,000 mg Oral Q6H   Or   acetaminophen  (TYLENOL ) oral liquid 160 mg/5 mL  1,000 mg Per Tube Q6H   aspirin  EC  325 mg Oral Daily   Or   aspirin   324 mg Per Tube Daily   atorvastatin   80 mg Oral Daily   bisacodyl   10 mg Oral Daily   Or   bisacodyl   10 mg Rectal Daily   calcium  acetate  2,001 mg Oral TID with meals   carvedilol   12.5 mg Oral BID WC   Chlorhexidine  Gluconate Cloth  6 each Topical Q0600   darbepoetin (ARANESP ) injection - DIALYSIS  100 mcg Subcutaneous Q Mon-1800   docusate sodium   200 mg Oral Daily   heparin  injection (subcutaneous)  5,000 Units Subcutaneous Q8H   insulin  aspart  0-15 Units Subcutaneous TID WC  insulin  glargine-yfgn  5 Units Subcutaneous BID   pantoprazole   40 mg Oral Daily   sodium chloride  flush  3 mL Intravenous Q12H   sodium chloride  flush  3 mL Intravenous Q12H    Dialysis Orders MWF - [facility] 4hr, 450/A1.5, EDW 102.3kg, 2K/2Ca, AVF, no heparin  - Mircera 50mcg IV q 2 weeks (last 12/24) - Calcitriol  1.24mcg PO q HD   Assessment/Plan: NSTEMI/CAD -> CABG x 3 on 06/28/23: C/b hyperK post-op, improved with HD. ESRD: Continue HD on MWF schedule - next HD tomorrow. HTN/volume: BP high, imaging with pulm edema - work volume down with HD. Anemia of ESRD: Hgb 7.8 - continue Aranesp  q Monday. Secondary HPTH: Ca fine, Phos high - wasn't getting binder consistently, now better - follow. Nutrition: Alb low, continue protein supps.   Izetta Boehringer, PA-C 07/03/2023, 10:38 AM  [employer]

## 2023-07-03 NOTE — Progress Notes (Signed)
  Inpatient Rehabilitation Admissions Coordinator   I met with patient at bedside. Prior to admit he lived at home with his 53 year old son,who attends [employer] and plays football. He works for [employer] for the [employer] and used [facility] for OP hemodialysis. He does not have 24/7 supervision that he will initially need at discharge, therefore I am recommending short term SNF. He is in agreement. I will alert acute team and TOC. We will not pursue CIR.  Heron Leavell, RN, MSN Rehab Admissions Coordinator [phone] 07/03/2023 12:40 PM

## 2023-07-03 NOTE — Progress Notes (Signed)
 Physical Therapy Treatment Patient Details Name: Corey Park MRN: [id number] DOB: October 03, 1970 Today's Date: 07/03/2023   History of Present Illness Pt is a 53 y/o male presenting on 06/25/23 with acute central chest pain and dyspnea. Pt with NSTEMI and underwent CABG x3 on 06/28/23. Had hyperkalemia post surgery and underwent urgent HD. 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    PT Comments  Pt alert but drowsy, irritated with having to mobilize. Pt requiring modA for transfers, presenting with decreased activity tolerance, generalized weakness and would benefit from inpatient rehab program > 3 hrs a day to achieve safe mod I level of function to return home with son. Acute PT to cont to follow.    If plan is discharge home, recommend the following: A little help with walking and/or transfers;A lot of help with bathing/dressing/bathroom;Assistance with cooking/housework;Assist for transportation;Help with stairs or ramp for entrance   Can travel by private vehicle        Equipment Recommendations  Rolling walker (2 wheels)    Recommendations for Other Services Rehab consult     Precautions / Restrictions Precautions Precautions: Sternal Precaution Booklet Issued: Yes (comment) Precaution Comments: verbally reviewed since pt cannot see and left handout for pt's family, pt able to recall minimizing use of bilat UEs Restrictions Weight Bearing Restrictions Per Provider Order: Yes RUE Weight Bearing Per Provider Order: Non weight bearing LUE Weight Bearing Per Provider Order: Non weight bearing     Mobility  Bed Mobility Overal bed mobility: Needs Assistance Bed Mobility: Rolling, Sidelying to Sit Rolling: Contact guard assist Sidelying to sit: Min assist       General bed mobility comments: max directional verbal and tactile cues, minA for trunk elevation, pt held onto hear pillow    Transfers Overall transfer level: Needs  assistance Equipment used: 1 person hand held assist Transfers: Sit to/from Stand, Bed to chair/wheelchair/BSC Sit to Stand: Mod assist   Step pivot transfers: Mod assist       General transfer comment: vc's for scooting fwd. modA to power up and maintain anterior weight shift. modA to steady for step pvt transfer to chair, attempted to march in place however pt became irritated and returned to sitting stating this shit is hard    Ambulation/Gait                   Stairs             Wheelchair Mobility     Tilt Bed    Modified Rankin (Stroke Patients Only)       Balance Overall balance assessment: Needs assistance Sitting-balance support: No upper extremity supported, Feet supported Sitting balance-Leahy Scale: Good     Standing balance support: Bilateral upper extremity supported, During functional activity Standing balance-Leahy Scale: Poor Standing balance comment: reliant on UE support                            Cognition Arousal: Alert (but sleepy, keeps eyes closed) Behavior During Therapy: Flat affect Overall Cognitive Status: No family/caregiver present to determine baseline cognitive functioning                                 General Comments: Pt AAOx4  with flat affect, delayed response time and irritation regarding the request to move  Exercises      General Comments General comments (skin integrity, edema, etc.): VSS      Pertinent Vitals/Pain Pain Assessment Pain Assessment: Faces Faces Pain Scale: Hurts a little bit Pain Location: chest Pain Descriptors / Indicators: Aching    Home Living                          Prior Function            PT Goals (current goals can now be found in the care plan section) Acute Rehab PT Goals Patient Stated Goal: return home PT Goal Formulation: With patient Time For Goal Achievement: 07/14/23 Potential to Achieve Goals: Good Progress  towards PT goals: Progressing toward goals    Frequency    Min 1X/week      PT Plan      Co-evaluation              AM-PAC PT 6 Clicks Mobility   Outcome Measure  Help needed turning from your back to your side while in a flat bed without using bedrails?: A Little Help needed moving from lying on your back to sitting on the side of a flat bed without using bedrails?: A Lot Help needed moving to and from a bed to a chair (including a wheelchair)?: A Lot Help needed standing up from a chair using your arms (e.g., wheelchair or bedside chair)?: A Lot Help needed to walk in hospital room?: Total Help needed climbing 3-5 steps with a railing? : Total 6 Click Score: 11    End of Session Equipment Utilized During Treatment: Oxygen Activity Tolerance: Patient tolerated treatment well Patient left: with call bell/phone within reach;in chair;with chair alarm set Nurse Communication: Mobility status PT Visit Diagnosis: Unsteadiness on feet (R26.81);Difficulty in walking, not elsewhere classified (R26.2);Pain Pain - part of body:  (chest)     Time: 8884-8866 PT Time Calculation (min) (ACUTE ONLY): 18 min  Charges:    $Gait Training: 8-22 mins PT General Charges $$ ACUTE PT VISIT: 1 Visit                     Norene Ames, PT, DPT Acute Rehabilitation Services Secure chat preferred Office #: [phone]    Norene CHRISTELLA Ames 07/03/2023, 12:38 PM

## 2023-07-03 NOTE — TOC Progression Note (Signed)
 Transition of Care [facility]) - Progression Note    Patient Details  Name: Corey Park MRN: [id number] Date of Birth: [birth year]  [facility] CM/SW Contact  Montie LOISE Louder, [place] Phone Number: 07/03/2023, 3:25 PM  Clinical Narrative:     CSW met with patient. Patient confirmed he is agreeable to short term rehab at [facility]. Patient states no preferred SNF at this time. Patient requested to contact his mother,[name], once we have received bed offers.  TOC will continue to follow and assist with discharge planning.  Montie Louder, MSW, LCSW Clinical Social Worker    Expected Discharge Plan: Skilled Nursing Facility Barriers to Discharge: Continued Medical Work up  Expected Discharge Plan and Services In-house Referral: NA Discharge Planning Services: CM Consult Post Acute Care Choice: Skilled Nursing Facility Living arrangements for the past 2 months: Apartment                                       Social Determinants of Health (SDOH) Interventions SDOH Screenings   Food Insecurity: No Food Insecurity (06/26/2023)  Housing: Unknown (06/26/2023)  Recent Concern: Housing - Medium Risk (04/02/2023)  Transportation Needs: No Transportation Needs (06/26/2023)  Recent Concern: Transportation Needs - Unmet Transportation Needs (05/09/2023)  Utilities: Not At Risk (06/26/2023)  Depression (PHQ2-9): Low Risk  (08/10/2022)  Tobacco Use: Low Risk  (06/28/2023)    Readmission Risk Interventions    06/29/2023    1:27 PM 12/13/2022   10:39 AM  Readmission Risk Prevention Plan  Transportation Screening Complete Complete  PCP or Specialist Appt within 3-5 Days  Complete  HRI or Home Care Consult  Complete  Social Work Consult for Recovery Care Planning/Counseling  Complete  Palliative Care Screening  Complete  Medication Review Oceanographer) Referral to Pharmacy Referral to Pharmacy  HRI or Home Care Consult Complete   SW Recovery Care/Counseling Consult  Complete   Palliative Care Screening Not Applicable

## 2023-07-03 NOTE — Plan of Care (Signed)
  Problem: Education: Goal: Ability to describe self-care measures that may prevent or decrease complications (Diabetes Survival Skills Education) will improve Outcome: Progressing   Problem: Coping: Goal: Ability to adjust to condition or change in health will improve Outcome: Progressing   Problem: Fluid Volume: Goal: Ability to maintain a balanced intake and output will improve Outcome: Progressing   Problem: Health Behavior/Discharge Planning: Goal: Ability to manage health-related needs will improve Outcome: Progressing   Problem: Metabolic: Goal: Ability to maintain appropriate glucose levels will improve Outcome: Progressing   Problem: Nutritional: Goal: Maintenance of adequate nutrition will improve Outcome: Progressing   Problem: Skin Integrity: Goal: Risk for impaired skin integrity will decrease Outcome: Progressing   Problem: Activity: Goal: Ability to return to baseline activity level will improve Outcome: Progressing   Problem: Education: Goal: Knowledge of General Education information will improve Description: Including pain rating scale, medication(s)/side effects and non-pharmacologic comfort measures Outcome: Progressing   Problem: Nutritional: Goal: Progress toward achieving an optimal weight will improve Outcome: Not Progressing

## 2023-07-03 NOTE — Progress Notes (Signed)
   Patient Name: Revere Maahs Date of Encounter: 07/03/2023  [facility] Cardiologist: Oneil Parchment, MD   Interval Summary  .    No complaints, sitting up. Answers questions appropriately   Vital Signs .    Vitals:   07/02/23 2315 07/03/23 0319 07/03/23 0754 07/03/23 0853  BP: 133/78 (!) 148/78 (!) 158/92 (!) 150/90  Pulse: 92 82  85  Resp: (!) 24 18 18    Temp: 98.7 F (37.1 C) 98.6 F (37 C) 98.5 F (36.9 C)   TempSrc: Oral Oral Oral   SpO2: 93% 100% 98%   Weight:  104.2 kg    Height:        Intake/Output Summary (Last 24 hours) at 07/03/2023 1006 Last data filed at 07/03/2023 0800 Gross per 24 hour  Intake 630 ml  Output 3000 ml  Net -2370 ml      07/03/2023    3:19 AM 07/02/2023   12:36 PM 07/02/2023    8:15 AM  Last 3 Weights  Weight (lbs) 229 lb 11.5 oz 228 lb 6.3 oz 235 lb 0.2 oz  Weight (kg) 104.2 kg 103.6 kg 106.6 kg      Telemetry/ECG    Sinus Rhythm - Personally Reviewed  Physical Exam .   GEN: No acute distress.   Neck: No JVD Cardiac: RRR, no murmurs, rubs, or gallops.  Respiratory: Clear to auscultation bilaterally. GI: Soft, nontender, non-distended  MS: No edema  Assessment & Plan .     53 y.o. African-American male patient with diabetes mellitus and associated complications including diabetic retinopathy, end-stage renal disease on hemodialysis, hypertension, hypercholesterolemia presenting with NSTEMI SP CABG on 06/28/2023.   NSTEMI S/p 3v CABG  -- on ASA 325mg  daily with plans to add plavix  and decrease ASA closer to DC -- statin, BB -- being evaluated by CIR  HTN -- blood pressures elevated, metoprolol  was increased to 25mg  TID yesterday -- will switch to coreg  12.5mg  BID (PTA dose was 25mg  BID)  ESRD on HD -- on MWF schedule, per nephrology  HLD -- on atorvastatin  80mg  daily   For questions or updates, please contact  [facility] Please consult [email or web address] for contact info under        Signed, Manuelita Rummer, NP
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
## 2023-07-03 NOTE — Plan of Care (Signed)
  Problem: Fluid Volume: Goal: Ability to maintain a balanced intake and output will improve Outcome: Progressing   Problem: Coping: Goal: Ability to adjust to condition or change in health will improve Outcome: Progressing   Problem: Nutritional: Goal: Maintenance of adequate nutrition will improve Outcome: Progressing   Problem: Skin Integrity: Goal: Risk for impaired skin integrity will decrease Outcome: Progressing   Problem: Education: Goal: Understanding of CV disease, CV risk reduction, and recovery process will improve Outcome: Progressing   Problem: Activity: Goal: Ability to return to baseline activity level will improve Outcome: Progressing

## 2023-07-03 NOTE — Progress Notes (Signed)
 Tried to wean off to RA from [place] 1ltrs/ spo2 dropped to 88% so put him on 02 AGAIN VIA [place] 2ltrs Patient was more interactive and alert than yesterday, we're able to transfer him on the chair and was comfortable on chair for few hours. His oral intake is not adequate, encouraged him and tried to fed him, he had few cups of fluids and small % of foods.  No any complains, call bell in reach.

## 2023-07-04 LAB — RENAL FUNCTION PANEL
Albumin: 2.6 g/dL — ABNORMAL LOW (ref 3.5–5.0)
Anion gap: 15 (ref 5–15)
BUN: 61 mg/dL — ABNORMAL HIGH (ref 6–20)
CO2: 27 mmol/L (ref 22–32)
Calcium: 8.9 mg/dL (ref 8.9–10.3)
Chloride: 93 mmol/L — ABNORMAL LOW (ref 98–111)
Creatinine, Ser: 13.96 mg/dL — ABNORMAL HIGH (ref 0.61–1.24)
GFR, Estimated: 4 mL/min — ABNORMAL LOW (ref >60)
Glucose, Bld: 110 mg/dL — ABNORMAL HIGH (ref 70–99)
Phosphorus: 7.9 mg/dL — ABNORMAL HIGH (ref 2.5–4.6)
Potassium: 4.3 mmol/L (ref 3.5–5.1)
Sodium: 135 mmol/L (ref 135–145)

## 2023-07-04 LAB — GLUCOSE, CAPILLARY
Glucose-Capillary: 102 mg/dL — ABNORMAL HIGH (ref 70–99)
Glucose-Capillary: 149 mg/dL — ABNORMAL HIGH (ref 70–99)
Glucose-Capillary: 83 mg/dL (ref 70–99)
Glucose-Capillary: 140 mg/dL — ABNORMAL HIGH (ref 70–99)

## 2023-07-04 MED ORDER — NEPRO/CARBSTEADY PO LIQD
237.0000 mL | Freq: Two times a day (BID) | ORAL | Status: DC
  Administered 2023-07-04 – 2023-07-07 (×4): 237 mL via ORAL

## 2023-07-04 NOTE — Progress Notes (Signed)
 Mobility Specialist Progress Note:   07/04/23 1136  Mobility  Activity Transferred from bed to chair  Level of Assistance Minimal assist, patient does 75% or more  Assistive Device Front wheel walker  Distance Ambulated (ft) 3 ft  RUE Weight Bearing Per Provider Order NWB  LUE Weight Bearing Per Provider Order NWB  Activity Response Tolerated well  Mobility Referral Yes  Mobility visit 1 Mobility  Mobility Specialist Start Time (ACUTE ONLY) 1000  Mobility Specialist Stop Time (ACUTE ONLY) 1017  Mobility Specialist Time Calculation (min) (ACUTE ONLY) 17 min   Pre Mobility: 90% SpO2 RA During Mobility: 86%-92% SpO2 RA- 2 L Post Mobility: 85 HR , 95% SpO2 2  L  Pt received in bed, agreeable to mobility. MinA for bed mobility and to stand within sternal precautions. Pt was able to take small stiff forward and lateral steps to chair from bedside with ModA verbal cues d/t blindness. Desat to 86% on RA with good pleth. Pursed lip breathing encouraged. Pt needing 2 L to keep sats >90%. Pt denied any SOB or discomfort during session. Pt left in chair with call bell in reach and all needs met.    Brown Husband  Mobility Specialist Please contact via [employer] office at [phone]

## 2023-07-04 NOTE — Consult Note (Signed)
 [facility] Liaison Consult Note   07/04/2023  Corey Park November 28, 1970 [id number]  Insurance: [employer]    Primary Care Provider: Lorren Greig PARAS, NP, with [facility] at [facility], this provider is listed for the transition of care follow up appointments  and [facility] calls   [facility] Liaison met patient at bedside at [facility]. Patient is up in recliner and answers some questions with thumbs up.  Explained reason for beside visit to check for community follow up needs.  Patient is accepting however states, I think I want to go home and get therapy at home because I don't trust people... I'm blind you know. Expressed acknowledgement of knowing he is blind and understanding. Explained that anticipated follow up with [facility] RN is important for an additional support to him if he goes home.  He expresses that his mother and son is available and supportive. They are listed in his contact but not in DPR as I can see in electronic medical record.  The patient was screened for 9 day LLOS hospitalization with noted extreme risk score for unplanned readmission risk with 5 ED visits and 3 hospital admissions in 6 months.  The patient was assessed for potential Community Care Coordination service needs for post hospital transition for care coordination. Review of patient's electronic medical record reveals patient is admitted with NSTEM s/p CABG x 3.   Plan: [facility] Liaison will continue to follow progress and disposition to asess for post hospital community care coordination/management needs.  Referral request for community care coordination: anticipate VBCI RN TOC to follow up if returning home.  If goes to SNF will alert [facility] RN.   VBCI Community Care, Population Health does not replace or interfere with any arrangements made by the Inpatient Transition of Care team.   For questions contact:   Richerd Fish, RN, BSN, CCM [place]   [facility], [facility] Liaison Direct Dial: [phone] or secure chat Email: [email or web address]

## 2023-07-04 NOTE — Progress Notes (Signed)
 Nurse requested Mobility Specialist to perform oxygen saturation test with pt which includes removing pt from oxygen both at rest and while ambulating.  Below are the results from that testing.     Patient Saturations on Room Air at Rest = spO2 90%  Patient Saturations on Room Air while Ambulating = sp02 86% .  Rested and performed pursed lip breathing for 1 minute with sp02 at 87%.  Patient Saturations on 2 Liters of oxygen while Ambulating = sp02 92%  At end of testing pt left in room on 2  Liters of oxygen.  Reported results to nurse.

## 2023-07-04 NOTE — Progress Notes (Signed)
 Occupational Therapy Treatment Patient Details Name: Corey Park MRN: [id number] DOB: [birth year] Today's Date: 07/04/2023   History of present illness Pt is a 53 y/o male presenting on 06/25/23 with acute central chest pain and dyspnea. Pt with NSTEMI and underwent CABG x3 on 06/28/23. Had hyperkalemia post surgery and underwent urgent HD. 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   OT comments  OT session focused on training in performing ADLs while adhering to sternal precautions. Pt demonstrates good carryover of training in sternal precautions from prior sessions, requiring only one cue to state all sternal precautions and occasional cues for compensatory techniques to adhere to sternal precautions during bed mobility and ADLs. Pt currently demonstrates ability to complete ADLs with Set up to Min assist in sitting/lateral leans and bed mobility during/in preparation for ADLs with Supervision to Contact guard assist all while adhering to sternal precautions. Pt participated well, is highly motivated to return to PLOF, and is making good progress toward goals. VSS on 2L continuous O2 through nasal cannula throughout session. Pt will benefit from continued acute skilled OT services to address deficits outlined below and increase safety and independence with functional tasks. Discharge plan updated this day as pt is declining intensive inpatient rehab at this time and based on pt progress making good progress toward goals. Post acute discharge, pt will benefit from continued skilled OT services in the home to maximize rehab potential paired with 24/7 supervision/assistance from family.       If plan is discharge home, recommend the following:  A little help with walking and/or transfers;A little help with bathing/dressing/bathroom;Assistance with cooking/housework;Assist for transportation;Help with stairs or ramp for entrance;Direct supervision/assist  for medications management   Equipment Recommendations  Other (comment) (RW)    Recommendations for Other Services      Precautions / Restrictions Precautions Precautions: Sternal Precaution Booklet Issued: Yes (comment) Precaution Comments: verbally reviewed since pt cannot see and left handout for pt's family, pt able to recall minimizing use of bilat UEs Restrictions Weight Bearing Restrictions Per Provider Order: Yes RUE Weight Bearing Per Provider Order: Non weight bearing LUE Weight Bearing Per Provider Order: Non weight bearing       Mobility Bed Mobility Overal bed mobility: Needs Assistance Bed Mobility: Sidelying to Sit, Sit to Sidelying, Rolling Rolling: Supervision, Contact guard assist (adhering to sternal precations) Sidelying to sit: Supervision, Contact guard assist (adhering to sternal precations)     Sit to sidelying: Supervision (adhering to sternal precations) General bed mobility comments: with increased time and verbal cues for technique and with pt holding heart pillow    Transfers Overall transfer level: Needs assistance                 General transfer comment: Pt declining addressing transfers this day due to sitting up in recliner and woorking with mobility specialist earlier this day     Balance Overall balance assessment: Needs assistance Sitting-balance support: No upper extremity supported, Feet supported Sitting balance-Leahy Scale: Good                                     ADL either performed or assessed with clinical judgement   ADL Overall ADL's : Needs assistance/impaired Eating/Feeding: Set up;Sitting (orientation to tray due to blindness)   Grooming: Set up;Sitting (orientation to supplies due to blindness; adhering to sternal precautions)  Upper Body Bathing: Contact guard assist;Minimal assistance;Cueing for compensatory techniques;Sitting (orientation to supplies due to blindness; adhering to sternal  precautions)   Lower Body Bathing: Contact guard assist;Minimal assistance;Sitting/lateral leans;Cueing for compensatory techniques (orientation to supplies due to blindness; adhering to sternal precautions)   Upper Body Dressing : Set up;Supervision/safety;Cueing for compensatory techniques;Sitting (orientation to supplies due to blindness; adhering to sternal precautions)   Lower Body Dressing: Contact guard assist;Minimal assistance;Cueing for compensatory techniques;Sitting/lateral leans (orientation to supplies due to blindness; adhering to sternal precautions)     Toilet Transfer Details (indicate cue type and reason): pt declined addressing transfers this session due to sitting up in the recliner and working with mobility specialist earlier this day Toileting- Architect and Hygiene: Supervision/safety;Contact guard assist;Sitting/lateral lean;Cueing for compensatory techniques (orientation to supplies due to blindness; adhering to sternal precautions) Toileting - Clothing Manipulation Details (indicate cue type and reason): simulated at EOB            Extremity/Trunk Assessment Upper Extremity Assessment Upper Extremity Assessment: Left hand dominant;Overall WFL for tasks assessed (within sternal precations)   Lower Extremity Assessment Lower Extremity Assessment: Defer to PT evaluation        Vision       Perception     Praxis      Cognition Arousal: Alert Behavior During Therapy: [facility] for tasks assessed/performed Overall Cognitive Status: No family/caregiver present to determine baseline cognitive functioning                                 General Comments: Pt AAOx4 and pleasant throughout session. Pt with slow processing and benefiting from use of short sentances. Pt able to follow 1 and 2 step instructions consistently. Pt also benefits from orientation to environment and narration of staff activities/location due to pt blindness. Expect  pt's cognition is at baseline but no family present to verify.        Exercises      Shoulder Instructions       General Comments Pt demonstrating good recall of sternal precations this day with pt able to verbal state precautions with only one cue needed and able to adhere to sternal precautions during functional tasks with occasional cues for compensatory techniques. OT discussed discarge plans with pt as he is declining SNF at this time. Pt states his son and mother have agreed to provide 24/7 care with the two of them trading off. OT educated pt on importance of allowing family to assist with meal prep and home management tasks at this time for pt to avoid breaking sternal precautions and to have family present during ADLs/functional transfers/mobility at this time for safety and assist as needed to adhere to sternal precautions. Pt verbalizes understanding and agreement of all training and recommendations. Pt VSS on 2L continuous O2 through nasal cannula    Pertinent Vitals/ Pain       Pain Assessment Pain Assessment: Faces Faces Pain Scale: Hurts a little bit Pain Location: chest Pain Descriptors / Indicators: Aching, Discomfort Pain Intervention(s): Monitored during session, Limited activity within patient's tolerance, Repositioned  Home Living                                          Prior Functioning/Environment              Frequency  Min  1X/week        Progress Toward Goals  OT Goals(current goals can now be found in the care plan section)  Progress towards OT goals: Progressing toward goals  Acute Rehab OT Goals Patient Stated Goal: to be as independent as possible and to return home with family support  Plan      Co-evaluation                 AM-PAC OT 6 Clicks Daily Activity     Outcome Measure   Help from another person eating meals?: A Little Help from another person taking care of personal grooming?: A Little Help from  another person toileting, which includes using toliet, bedpan, or urinal?: A Little Help from another person bathing (including washing, rinsing, drying)?: A Little Help from another person to put on and taking off regular upper body clothing?: A Little Help from another person to put on and taking off regular lower body clothing?: A Little 6 Click Score: 18    End of Session Equipment Utilized During Treatment: Oxygen  OT Visit Diagnosis: Other abnormalities of gait and mobility (R26.89);Other (comment) (decreased activity tolerance)   Activity Tolerance Patient tolerated treatment well   Patient Left in bed;with call bell/phone within reach;with bed alarm set   Nurse Communication Mobility status;Other (comment) (Pt demonstrating good progress toward OT goals. Pt declining SNF at this time and repotring family can provide 24/7 supervision/assistance.)        Time: 1247-1310 OT Time Calculation (min): 23 min  Charges: OT General Charges $OT Visit: 1 Visit OT Treatments $Self Care/Home Management : 23-37 mins  Margarie Rockey HERO., OTR/L, MA Acute Rehab [phone]   Margarie FORBES Horns 07/04/2023, 1:45 PM

## 2023-07-04 NOTE — Progress Notes (Signed)
 [facility] Progress Note   Subjective:  Seen in room - slow improvement. No CP/dyspnea today. For HD later today per usual MWF schedule.  Objective Vitals:   07/03/23 1936 07/03/23 2316 07/04/23 0311 07/04/23 0814  BP: (!) 159/82 (!) 141/76 137/80 (!) 147/75  Pulse: 83 85 83   Resp: 20 18 16 16   Temp: 97.9 F (36.6 C) 98 F (36.7 C) 97.7 F (36.5 C) 98.4 F (36.9 C)  TempSrc: Axillary Oral Oral Oral  SpO2: 98% 100% 100% 100%  Weight:      Height:       Physical Exam General: More well appearing, NAD. Nasal O2 in place. Heart: RRR; no murmur. Healing midline incision Lungs: CTA anteriorly Abdomen:soft Extremities: no LE edema Dialysis Access:  RUE AVF +t/b  Additional Objective Labs: Basic Metabolic Panel: Recent Labs  Lab 07/02/23 0255 07/03/23 0354 07/04/23 0357  NA 136 135 135  K 4.7 4.2 4.3  CL 95* 93* 93*  CO2 26 24 27   GLUCOSE 101* 88 110*  BUN 53* 43* 61*  CREATININE 14.70* 11.58* 13.96*  CALCIUM  8.8* 8.8* 8.9  PHOS 8.4* 6.7* 7.9*   Liver Function Tests: Recent Labs  Lab 07/02/23 0255 07/03/23 0354 07/04/23 0357  ALBUMIN  2.9* 2.7* 2.6*   CBC: Recent Labs  Lab 06/28/23 1909 06/28/23 2250 06/29/23 0544 06/29/23 1718 07/01/23 0758 07/02/23 0708  WBC 11.7*  --  10.4 10.3 10.1 10.2  NEUTROABS 9.8*  --   --   --   --   --   HGB 9.2*   < > 9.0* 7.9* 6.7* 7.8*  HCT 27.9*   < > 27.0* 24.7* 20.7* 24.1*  MCV 97.9  --  97.5 101.6* 101.5* 97.6  PLT 127*  --  142* 113* 105* 124*   < > = values in this interval not displayed.   Medications:   (feeding supplement) PROSource Plus  30 mL Oral BID BM   aspirin  EC  325 mg Oral Daily   Or   aspirin   324 mg Per Tube Daily   atorvastatin   80 mg Oral Daily   bisacodyl   10 mg Oral Daily   Or   bisacodyl   10 mg Rectal Daily   calcium  acetate  2,001 mg Oral TID with meals   carvedilol   12.5 mg Oral BID WC   Chlorhexidine  Gluconate Cloth  6 each Topical Q0600   darbepoetin (ARANESP )  injection - DIALYSIS  100 mcg Subcutaneous Q Mon-1800   docusate sodium   200 mg Oral Daily   feeding supplement (NEPRO CARB STEADY)  237 mL Oral BID BM   heparin  injection (subcutaneous)  5,000 Units Subcutaneous Q8H   insulin  aspart  0-15 Units Subcutaneous TID WC   insulin  glargine-yfgn  5 Units Subcutaneous BID   losartan   25 mg Oral Daily   pantoprazole   40 mg Oral Daily   sodium chloride  flush  3 mL Intravenous Q12H   sodium chloride  flush  3 mL Intravenous Q12H    Dialysis Orders MWF - [facility] 4hr, 450/A1.5, EDW 102.3kg, 2K/2Ca, AVF, no heparin  - Mircera 50mcg IV q 2 weeks (last 12/24) - Calcitriol  1.69mcg PO q HD   Assessment/Plan: NSTEMI/CAD -> CABG x 3 on 06/28/23: C/b hyperK post-op, improved with HD. ESRD: Continue HD on MWF schedule - for HD today. HTN/volume: BP high, imaging with pulm edema - work volume down with HD. Anemia of ESRD: Hgb 7.8 - continue Aranesp  q Monday. S/p 2U PRBCs this admit. Secondary HPTH: Ca  fine, Phos high - wasn't getting binder consistently, now better - follow. Nutrition: Alb low, continue protein supps.   Izetta Boehringer, PA-C 07/04/2023, 10:39 AM  [employer]

## 2023-07-04 NOTE — Plan of Care (Signed)
   Problem: Nutrition: Goal: Adequate nutrition will be maintained Outcome: Progressing

## 2023-07-04 NOTE — Progress Notes (Addendum)
      [address]       [place] [address]             [phone]        6 Days Post-Op Procedure(s) (LRB): CORONARY ARTERY BYPASS GRAFTING TIMES THREE USING LEFT INTERNAL MAMMARY ARTERY AND RIGHT GREAT SAPHENOUS VEIN HARVESTED ENDOSCOPICALLY (N/A) TRANSESOPHAGEAL ECHOCARDIOGRAM (TEE) (N/A)  Subjective: Patient without specific complaints this am. He does not want to go to SNF;he wants to go home and has son and mother to help him.  Objective: Vital signs in last 24 hours: Temp:  [97.7 F (36.5 C)-98.5 F (36.9 C)] 97.7 F (36.5 C) (01/08 0311) Pulse Rate:  [80-86] 83 (01/08 0311) Cardiac Rhythm: Normal sinus rhythm (01/07 1900) Resp:  [16-20] 16 (01/08 0311) BP: (137-159)/(76-92) 137/80 (01/08 0311) SpO2:  [88 %-100 %] 100 % (01/08 0311)  Pre op weight 103.5 kg Current Weight  07/03/23 104.2 kg      Intake/Output from previous day: 01/07 0701 - 01/08 0700 In: 720 [P.O.:720] Out: -    Physical Exam:  Cardiovascular: RRR Pulmonary: Clear bilaterally Abdomen: Soft, non tender, bowel sounds present. Extremities: Trace bilateral lower extremity edema. RUE with bruit and thrill, aneursymal Wounds: Sternal wound is clean and dry.  No erythema or signs of infection. RLE dressing wound is clean and dry. Neurologic: Moving all extremities  Lab Results: CBC: Recent Labs    07/01/23 0758 07/02/23 0708  WBC 10.1 10.2  HGB 6.7* 7.8*  HCT 20.7* 24.1*  PLT 105* 124*   BMET:  Recent Labs    07/03/23 0354 07/04/23 0357  NA 135 135  K 4.2 4.3  CL 93* 93*  CO2 24 27  GLUCOSE 88 110*  BUN 43* 61*  CREATININE 11.58* 13.96*  CALCIUM  8.8* 8.9    PT/INR:  Lab Results  Component Value Date   INR 1.4 (H) 06/28/2023   ABG:  INR: Will add last result for INR, ABG once components are confirmed Will add last 4 CBG results once components are confirmed  Assessment/Plan:  1. CV - S/p NSTEMI. SR. On Coreg  12.5 mg bid and Losartan  25 mg daily.  Will start Plavix  (ACS) and decrease ec asa at discharge. 2.  Pulmonary - On 2 L oxygen via [place]. Wean as able. Per nurse, desaturation with ambulation so will likely need oxygen when ready for discharge. Encourage incentive spirometer. 3. ESRD-Creatinine this am 13.96. He had HD Monday HD days prior to surgery were Mon/Wed/Fri. HD likely today. Nephrology following 4.  Anemia of chronic disease - Last H and H 7.8 and 24.1  5. CBGs 92/83/102. On Insulin  but will decrease to avoid hypoglycemia. Pre op HGA1C 5.8. 6. Deconditioned-PT/OT 7. Unsure of etiology of paranoia this am;no prior history noted. Monitor 8. Disposition-patient legally blind. CIR recommends SNF. Will pursue SNF  Corey M ZimmermanPA-C 7:35 AM   Agree with above Dispo planning.  Corey Park Corey Park

## 2023-07-04 NOTE — TOC Progression Note (Addendum)
 Transition of Care (TOC) - Progression Note  Rayfield Gobble RN, BSN Transitions of Care Unit 4E- RN Case Manager See Treatment Team for direct phone #   Patient Details  Name: Corey Park MRN: [id number] Date of Birth: 1970/08/09  Transition of Care [facility]) CM/SW Contact  Gobble, Rayfield Hurst, RN Phone Number: 07/04/2023, 2:36 PM  Clinical Narrative:    CM notified that pt now wants to return home w/ [facility], note that [facility] orders have been placed as well as DME order for home 02.   CM in to speak with pt at bedside- pt confirmed that he no longer wants SNF and prefers to return home where he is comfortable in his environment as pt is legally blind. Pt voiced that he he has an adult son and his mom that assist him.   Discussed HH services, and pt agreeable- list provided for choice Per CMS guidelines from phonefinancing.pl website with star ratings (copy placed in shadow chart)- this writer reviewed list along with star ratings as pt blind- pt voiced that he is ok with the higher rated agencies ([facility] and [facility]) as well as CM checking with [facility] to see if TCTS office as made a referral to them from the office.  Pt states he does not have any preference for DME provider.  Pt also asking about a RW for home, CM will arrange this as well home 02.   Address, phone # and PCP all confirmed with pt, voiced son should be able to take him home.   Call made to [employer] liaison for DME needs- home 02 and RW- liaison to follow up and will deliver RW and portable 02 to room prior to discharge.   Call made to [facility] liaison to check and see if they have TCTS office referral on pt and if they can service insurance- referral pending-  1540- referral has been accepted by [facility] for [facility] needs- RN/PT/OT/aide  Expected Discharge Plan: Skilled Nursing Facility Barriers to Discharge: Continued Medical Work up  Expected Discharge Plan and Services In-house Referral: Clinical Social Work Discharge  Planning Services: CM Consult Post Acute Care Choice: Horticulturist, Commercial, Home Health Living arrangements for the past 2 months: Apartment                 DME Arranged: Oxygen, Walker rolling DME Agency: [employer] Date DME Agency Contacted: 07/04/23 Time DME Agency Contacted: 1435 Representative spoke with at DME Agency: [facility] HH Arranged: RN, PT, OT, Nurse's Aide HH Agency: [facility] ([facility]) Date HH Agency Contacted: 07/04/23 Time HH Agency Contacted: 1436 Representative spoke with at [facility] Agency: [facility]   Social Determinants of Health (SDOH) Interventions SDOH Screenings   Food Insecurity: No Food Insecurity (06/26/2023)  Housing: Unknown (06/26/2023)  Recent Concern: Housing - Medium Risk (04/02/2023)  Transportation Needs: No Transportation Needs (06/26/2023)  Recent Concern: Transportation Needs - Unmet Transportation Needs (05/09/2023)  Utilities: Not At Risk (06/26/2023)  Depression (PHQ2-9): Low Risk  (08/10/2022)  Tobacco Use: Low Risk  (06/28/2023)    Readmission Risk Interventions    06/29/2023    1:27 PM 12/13/2022   10:39 AM  Readmission Risk Prevention Plan  Transportation Screening Complete Complete  PCP or Specialist Appt within 3-5 Days  Complete  HRI or Home Care Consult  Complete  Social Work Consult for Recovery Care Planning/Counseling  Complete  Palliative Care Screening  Complete  Medication Review Ocean[facility]) Referral to Pharmacy Referral to Pharmacy  [facility] or Home Care Consult Complete  SW Recovery Care/Counseling Consult Complete   Palliative Care Screening Not Applicable

## 2023-07-05 ENCOUNTER — Ambulatory Visit: Payer: Medicare Other | Admitting: Gastroenterology

## 2023-07-05 ENCOUNTER — Inpatient Hospital Stay (HOSPITAL_COMMUNITY): Payer: Medicare Other

## 2023-07-05 LAB — RENAL FUNCTION PANEL
Albumin: 2.6 g/dL — ABNORMAL LOW (ref 3.5–5.0)
Anion gap: 16 — ABNORMAL HIGH (ref 5–15)
BUN: 77 mg/dL — ABNORMAL HIGH (ref 6–20)
CO2: 26 mmol/L (ref 22–32)
Calcium: 9 mg/dL (ref 8.9–10.3)
Chloride: 93 mmol/L — ABNORMAL LOW (ref 98–111)
Creatinine, Ser: 16.21 mg/dL — ABNORMAL HIGH (ref 0.61–1.24)
GFR, Estimated: 3 mL/min — ABNORMAL LOW (ref >60)
Glucose, Bld: 109 mg/dL — ABNORMAL HIGH (ref 70–99)
Phosphorus: 7.6 mg/dL — ABNORMAL HIGH (ref 2.5–4.6)
Potassium: 4.3 mmol/L (ref 3.5–5.1)
Sodium: 135 mmol/L (ref 135–145)

## 2023-07-05 LAB — CBC WITH DIFFERENTIAL/PLATELET
Abs Immature Granulocytes: 0.07 10*3/uL (ref 0.00–0.07)
Basophils Absolute: 0 10*3/uL (ref 0.0–0.1)
Basophils Relative: 0 %
Eosinophils Absolute: 0.6 10*3/uL — ABNORMAL HIGH (ref 0.0–0.5)
Eosinophils Relative: 5 %
HCT: 25.3 % — ABNORMAL LOW (ref 39.0–52.0)
Hemoglobin: 8.3 g/dL — ABNORMAL LOW (ref 13.0–17.0)
Immature Granulocytes: 1 %
Lymphocytes Relative: 9 %
Lymphs Abs: 1 10*3/uL (ref 0.7–4.0)
MCH: 31.9 pg (ref 26.0–34.0)
MCHC: 32.8 g/dL (ref 30.0–36.0)
MCV: 97.3 fL (ref 80.0–100.0)
Monocytes Absolute: 1.2 10*3/uL — ABNORMAL HIGH (ref 0.1–1.0)
Monocytes Relative: 11 %
Neutro Abs: 7.7 10*3/uL (ref 1.7–7.7)
Neutrophils Relative %: 74 %
Platelets: 214 10*3/uL (ref 150–400)
RBC: 2.6 MIL/uL — ABNORMAL LOW (ref 4.22–5.81)
RDW: 18.2 % — ABNORMAL HIGH (ref 11.5–15.5)
WBC: 10.5 10*3/uL (ref 4.0–10.5)
nRBC: 0 % (ref 0.0–0.2)

## 2023-07-05 LAB — GLUCOSE, CAPILLARY
Glucose-Capillary: 111 mg/dL — ABNORMAL HIGH (ref 70–99)
Glucose-Capillary: 172 mg/dL — ABNORMAL HIGH (ref 70–99)
Glucose-Capillary: 114 mg/dL — ABNORMAL HIGH (ref 70–99)
Glucose-Capillary: 117 mg/dL — ABNORMAL HIGH (ref 70–99)

## 2023-07-05 MED ORDER — LIDOCAINE HCL (PF) 1 % IJ SOLN: 5.0000 mL | INTRAMUSCULAR | Status: DC | PRN

## 2023-07-05 MED ORDER — CLOPIDOGREL BISULFATE 75 MG PO TABS
75.0000 mg | ORAL_TABLET | Freq: Every day | ORAL | Status: DC
  Administered 2023-07-06 – 2023-07-08 (×3): 75 mg via ORAL
  Filled 2023-07-05 (×3): qty 1

## 2023-07-05 MED ORDER — ANTICOAGULANT SODIUM CITRATE 4% (200MG/5ML) IV SOLN
5.0000 mL | Status: DC | PRN
Start: 2023-07-05 — End: 2023-07-05

## 2023-07-05 MED ORDER — LIDOCAINE-PRILOCAINE 2.5-2.5 % EX CREA: 1.0000 | TOPICAL_CREAM | CUTANEOUS | Status: DC | PRN

## 2023-07-05 MED ORDER — HEPARIN SODIUM (PORCINE) 1000 UNIT/ML DIALYSIS: 1000.0000 [IU] | INTRAMUSCULAR | Status: DC | PRN

## 2023-07-05 MED ORDER — ANTICOAGULANT SODIUM CITRATE 4% (200MG/5ML) IV SOLN: 5.0000 mL | Status: DC | PRN

## 2023-07-05 MED ORDER — ALTEPLASE 2 MG IJ SOLR: 2.0000 mg | Freq: Once | INTRAMUSCULAR | Status: DC | PRN

## 2023-07-05 MED ORDER — ASPIRIN 325 MG PO TBEC
325.0000 mg | DELAYED_RELEASE_TABLET | Freq: Every day | ORAL | Status: AC
  Administered 2023-07-05: 325 mg via ORAL
  Filled 2023-07-05: qty 1

## 2023-07-05 MED ORDER — PENTAFLUOROPROP-TETRAFLUOROETH EX AERO: 1.0000 | INHALATION_SPRAY | CUTANEOUS | Status: DC | PRN

## 2023-07-05 MED ORDER — CHLORHEXIDINE GLUCONATE CLOTH 2 % EX PADS
6.0000 | MEDICATED_PAD | Freq: Every day | CUTANEOUS | Status: DC
  Administered 2023-07-06 – 2023-07-08 (×3): 6 via TOPICAL

## 2023-07-05 MED ORDER — ASPIRIN 81 MG PO TBEC
81.0000 mg | DELAYED_RELEASE_TABLET | Freq: Every day | ORAL | Status: DC
  Administered 2023-07-06 – 2023-07-08 (×3): 81 mg via ORAL
  Filled 2023-07-05 (×3): qty 1

## 2023-07-05 NOTE — Plan of Care (Signed)
  Problem: Health Behavior/Discharge Planning: Goal: Ability to identify and utilize available resources and services will improve Outcome: Progressing

## 2023-07-05 NOTE — Progress Notes (Signed)
 PT Cancellation Note  Patient Details Name: Corey Park MRN: [id number] DOB: 09-Jun-1971   Cancelled Treatment:    Reason Eval/Treat Not Completed: (P) Patient at procedure or test/unavailable. Pt at HD. Will plan to follow-up later as time permits.   Theo Ferretti, PT, DPT Acute Rehabilitation Services  Office: [phone]    Theo CHRISTELLA Ferretti 07/05/2023, 7:40 AM

## 2023-07-05 NOTE — Progress Notes (Signed)
   07/05/23 0701  Vitals  BP 135/80  MAP (mmHg) 93  Pulse Rate 91  ECG Heart Rate 92  Resp 16  Oxygen Therapy  SpO2 100 %  O2 Device Nasal Cannula  O2 Flow Rate (L/min) 3 L/min  Patient Activity (if Appropriate) In bed  Pulse Oximetry Type Continuous  During Treatment Monitoring  Blood Flow Rate (mL/min) 350 mL/min  Arterial Pressure (mmHg) -110 mmHg  Venous Pressure (mmHg) 200 mmHg  TMP (mmHg) 15 mmHg  Ultrafiltration Rate (mL/min) 801 mL/min  Dialysate Flow Rate (mL/min) 300 ml/min  Duration of HD Treatment -hour(s) 3.29 hour(s)  Cumulative Fluid Removed (mL) per Treatment  2333.05  HD Safety Checks Performed Yes  Intra-Hemodialysis Comments Tolerated well;Progressing as prescribed  Post Treatment  Dialyzer Clearance Lightly streaked  Liters Processed 2500  Fluid Removed (mL) 2500 mL  Tolerated HD Treatment Yes  Post-Hemodialysis Comments pt stable  AVG/AVF Arterial Site Held (minutes) 10 minutes  AVG/AVF Venous Site Held (minutes) 10 minutes  Fistula / Graft Right Upper arm Arteriovenous fistula  Placement Date/Time: 10/16/19 9166   Placed prior to admission: No  Orientation: Right  Access Location: Upper arm  Access Type: (c) Arteriovenous fistula  Site Condition No complications  Fistula / Graft Assessment Thrill;Bruit  Status Deaccessed  Needle Size 15   Pt denies chest pain post treatment.

## 2023-07-05 NOTE — Progress Notes (Addendum)
      [address]       [place] [address]             [phone]        7 Days Post-Op Procedure(s) (LRB): CORONARY ARTERY BYPASS GRAFTING TIMES THREE USING LEFT INTERNAL MAMMARY ARTERY AND RIGHT GREAT SAPHENOUS VEIN HARVESTED ENDOSCOPICALLY (N/A) TRANSESOPHAGEAL ECHOCARDIOGRAM (TEE) (N/A)  Subjective: Patient just finishing HD this am. He wants to go back to his room.  Objective: Vital signs in last 24 hours: Temp:  [97.9 F (36.6 C)-98.6 F (37 C)] 98.3 F (36.8 C) (01/09 0340) Pulse Rate:  [82-91] 91 (01/09 0701) Cardiac Rhythm: Normal sinus rhythm (01/08 1900) Resp:  [11-22] 16 (01/09 0701) BP: (127-169)/(75-91) 135/80 (01/09 0701) SpO2:  [94 %-100 %] 100 % (01/09 0701)  Pre op weight 103.5 kg Current Weight  07/03/23 104.2 kg      Intake/Output from previous day: 01/08 0701 - 01/09 0700 In: -  Out: 200 [Urine:200]   Physical Exam:  Cardiovascular: RRR Pulmonary: Slightly diminished bibasilar breath sounds. Abdomen: Soft, non tender, bowel sounds present. Extremities: Trace bilateral lower extremity edema. RUE with bruit and thrill, aneursymal Wounds: Sternal wound is clean and dry.  No erythema or signs of infection. RLE dressing wound is clean and dry. Neurologic: Moving all extremities  Lab Results: CBC: Recent Labs    07/02/23 0708 07/05/23 0338  WBC 10.2 10.5  HGB 7.8* 8.3*  HCT 24.1* 25.3*  PLT 124* 214   BMET:  Recent Labs    07/04/23 0357 07/05/23 0340  NA 135 135  K 4.3 4.3  CL 93* 93*  CO2 27 26  GLUCOSE 110* 109*  BUN 61* 77*  CREATININE 13.96* 16.21*  CALCIUM  8.9 9.0    PT/INR:  Lab Results  Component Value Date   INR 1.4 (H) 06/28/2023   ABG:  INR: Will add last result for INR, ABG once components are confirmed Will add last 4 CBG results once components are confirmed  Assessment/Plan:  1. CV - S/p NSTEMI. SR. On Coreg  12.5 mg bid and Losartan  25 mg daily. Will start Plavix  (ACS) and decrease  ec asa in am. 2.  Pulmonary - On 2-3 L oxygen via [place]. Wean as able. Re check CXR.Per nurse, desaturation with ambulation so will likely need oxygen when ready for discharge. Encourage incentive spirometer. 3. ESRD-Creatinine this am 16.21. He had HD Monday and today. HD days prior to surgery were Mon/Wed/Fri. HD likely today. Nephrology following 4.  Anemia of chronic disease - H and H this am stable at 8.3 and 25.3 5. DM-CBGs 149/83/140. On Insulin . Pre op HGA1C 5.8. 6. Deconditioned-PT/OT 7. Unsure of etiology of paranoia this am;no prior history noted. Monitor 8. Thrombocytopenia resolved-platelets this am up to 214,000 9. Disposition-patient legally blind. CIR recommends SNF;however, patient prefers to go home with son and mom  Kyla HERO ZimmermanPA-C 7:07 AM   Agree with above Dispo planning  Patriciaann Rabanal MALVA Rayas

## 2023-07-05 NOTE — Progress Notes (Signed)
 Physical Therapy Treatment Patient Details Name: Corey Park MRN: [id number] DOB: 27-Jun-1970 Today's Date: 07/05/2023   History of Present Illness Pt is a 53 y/o male presenting on 06/25/23 with acute central chest pain and dyspnea. Pt with NSTEMI and underwent CABG x3 on 06/28/23. Had hyperkalemia post surgery and underwent urgent HD. 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    PT Comments  The pt expressed feeling fatigued from HD, but was agreeable to session with encouragement with the ultimate goal of pt discharging directly home with HHPT at d/c and not being reliant on his family. The pt is making good progress as he was able to ambulate up to ~110 ft with a RW and CGA for balance but intermittent minA to guide his RW due to him being blind. He was also able to navigate x1 stair with bil UE support and minA for balance. He demonstrated particular difficulty with transfers and with ascending his trunk with transitioning sidelying to sit from a flat bed to simulate home. As pt did not qualify for AIR and denied SNF, updating d/c recs to HHPT. Recommending frequent family assistance upon d/c. Will continue to follow acutely.     If plan is discharge home, recommend the following: A little help with walking and/or transfers;A lot of help with bathing/dressing/bathroom;Assistance with cooking/housework;Assist for transportation;Help with stairs or ramp for entrance   Can travel by private vehicle        Equipment Recommendations  Rolling walker (2 wheels)    Recommendations for Other Services       Precautions / Restrictions Precautions Precautions: Sternal Precaution Booklet Issued: Yes (comment) Precaution Comments: verbally reviewed since pt cannot see and left handout for pt's family, pt able to recall minimizing use of bilat UEs, demonstrates good compliance to precautions Restrictions Weight Bearing Restrictions Per Provider  Order: Yes RUE Weight Bearing Per Provider Order: Non weight bearing LUE Weight Bearing Per Provider Order: Non weight bearing Other Position/Activity Restrictions: sternal precautions     Mobility  Bed Mobility Overal bed mobility: Needs Assistance Bed Mobility: Rolling, Sidelying to Sit Rolling: Contact guard assist Sidelying to sit: Mod assist       General bed mobility comments: Bed flat and no use of rail to simulate home set-up. VCs provided to push off R foot on bed to facilitate trunk rotation to roll to L but pt brought bil legs off first then rolled onto his L side with extra effort. ModA needed to ascend trunk    Transfers Overall transfer level: Needs assistance Equipment used: Rolling walker (2 wheels) Transfers: Sit to/from Stand Sit to Stand: Min assist           General transfer comment: VCs provided to scoot to EOB, bring nose over toes, and gain momentum via rocking trunk to power up to stand, success noted, minA needed to power up to stand and gain balance from EOB to RW    Ambulation/Gait Ambulation/Gait assistance: Contact guard assist, Min assist Gait Distance (Feet): 110 Feet Assistive device: Rolling walker (2 wheels) Gait Pattern/deviations: Step-through pattern, Decreased stride length Gait velocity: decreased Gait velocity interpretation: <1.31 ft/sec, indicative of household ambulator   General Gait Details: Pt needs frequent verbal cues and intermittent minA to guide his RW due to him being blind. CGA needed for balance only, no LOB. Pt takes slow, small steps   Stairs Stairs: Yes Stairs assistance: Min assist Stair Management: One rail  Left, Forwards (with R HHA) Number of Stairs: 1 General stair comments: Ascends and descends x1 portable step with L hand on rail on wall and R HHA minA for balance throughout, providing VCs for guidance due to pt being blind   Wheelchair Mobility     Tilt Bed    Modified Rankin (Stroke Patients  Only)       Balance Overall balance assessment: Needs assistance Sitting-balance support: No upper extremity supported, Feet supported Sitting balance-Leahy Scale: Good     Standing balance support: Bilateral upper extremity supported, During functional activity Standing balance-Leahy Scale: Poor Standing balance comment: reliant on UE support                            Cognition Arousal: Alert Behavior During Therapy: Flat affect Overall Cognitive Status: No family/caregiver present to determine baseline cognitive functioning                                 General Comments: Flat affect, delayed response time, may be behavioral as pt needed encouragement to participate. Likely near baseline        Exercises      General Comments        Pertinent Vitals/Pain Pain Assessment Pain Assessment: Faces Faces Pain Scale: Hurts a little bit Pain Location: chest Pain Descriptors / Indicators: Discomfort, Grimacing, Operative site guarding Pain Intervention(s): Monitored during session, Limited activity within patient's tolerance    Home Living                          Prior Function            PT Goals (current goals can now be found in the care plan section) Acute Rehab PT Goals Patient Stated Goal: return home PT Goal Formulation: With patient Time For Goal Achievement: 07/14/23 Potential to Achieve Goals: Good Progress towards PT goals: Progressing toward goals    Frequency    Min 1X/week      PT Plan      Co-evaluation              AM-PAC PT 6 Clicks Mobility   Outcome Measure  Help needed turning from your back to your side while in a flat bed without using bedrails?: A Little Help needed moving from lying on your back to sitting on the side of a flat bed without using bedrails?: A Lot Help needed moving to and from a bed to a chair (including a wheelchair)?: A Little Help needed standing up from a chair  using your arms (e.g., wheelchair or bedside chair)?: A Little Help needed to walk in hospital room?: A Little Help needed climbing 3-5 steps with a railing? : A Little 6 Click Score: 17    End of Session Equipment Utilized During Treatment: Oxygen;Gait belt Activity Tolerance: Patient tolerated treatment well Patient left: with call bell/phone within reach;in chair Nurse Communication: Mobility status;Other (comment) (no chair alarm - RN ok with this) PT Visit Diagnosis: Unsteadiness on feet (R26.81);Difficulty in walking, not elsewhere classified (R26.2);Pain;Other abnormalities of gait and mobility (R26.89) Pain - part of body:  (chest)     Time: 8772-8688 PT Time Calculation (min) (ACUTE ONLY): 44 min  Charges:    $Gait Training: 23-37 mins $Therapeutic Activity: 8-22 mins PT General Charges $$ ACUTE PT VISIT: 1 Visit  Theo Ferretti, PT, DPT Acute Rehabilitation Services  Office: [phone]    Theo CHRISTELLA Ferretti 07/05/2023, 1:26 PM

## 2023-07-05 NOTE — Progress Notes (Signed)
 [facility] Progress Note   Subjective:   Seen in room - patient sitting uncomfortably on edge of bed. Reports weakness and fatigue after HD early this AM. Admits to CP at end of dialysis which has now subsided. Reports off and on dyspnea, denies any currently. On 3L O2 via nasal cannula. Denies N/V.  Objective Vitals:   07/05/23 0630 07/05/23 0700 07/05/23 0701 07/05/23 0934  BP: 132/82  135/80 117/87  Pulse: 88 91 91 95  Resp: 13 16 16 16   Temp:    98 F (36.7 C)  TempSrc:    Oral  SpO2: 100% 100% 100%   Weight:      Height:       Physical Exam General: Appears uncomfortable, NAD. Nasal O2 in place. Heart: RRR; no murmur. Healing midline incision, tender while auscultating.  Lungs: Minor rales in left lower lung field, CTA in all other fields bilaterally Abdomen: soft Extremities: no LE edema Dialysis Access:  RUE AVF +t/b   Additional Objective Labs: Basic Metabolic Panel: Recent Labs  Lab 07/03/23 0354 07/04/23 0357 07/05/23 0340  NA 135 135 135  K 4.2 4.3 4.3  CL 93* 93* 93*  CO2 24 27 26   GLUCOSE 88 110* 109*  BUN 43* 61* 77*  CREATININE 11.58* 13.96* 16.21*  CALCIUM  8.8* 8.9 9.0  PHOS 6.7* 7.9* 7.6*   Liver Function Tests: Recent Labs  Lab 07/03/23 0354 07/04/23 0357 07/05/23 0340  ALBUMIN  2.7* 2.6* 2.6*   CBC: Recent Labs  Lab 06/28/23 1909 06/28/23 2250 06/29/23 0544 06/29/23 1718 07/01/23 0758 07/02/23 0708 07/05/23 0338  WBC 11.7*  --  10.4 10.3 10.1 10.2 10.5  NEUTROABS 9.8*  --   --   --   --   --  7.7  HGB 9.2*   < > 9.0* 7.9* 6.7* 7.8* 8.3*  HCT 27.9*   < > 27.0* 24.7* 20.7* 24.1* 25.3*  MCV 97.9  --  97.5 101.6* 101.5* 97.6 97.3  PLT 127*  --  142* 113* 105* 124* 214   < > = values in this interval not displayed.   CBG: Recent Labs  Lab 07/04/23 0559 07/04/23 1217 07/04/23 1632 07/04/23 2054 07/05/23 0856  GLUCAP 102* 149* 83 140* 111*    Studies/Results: DG Chest 2 View Result Date: 07/05/2023 CLINICAL  DATA:  Pleural effusion. EXAM: CHEST - 2 VIEW COMPARISON:  July 02, 2023. FINDINGS: Stable cardiomegaly. Status post coronary artery bypass graft. Right lung is clear. Mild left basilar atelectasis or infiltrate is noted with probable small left pleural effusion. Bony thorax is unremarkable. IMPRESSION: Mild left basilar subsegmental atelectasis or infiltrate is noted with probable small left pleural effusion. Electronically Signed   By: Lynwood Landy Raddle M.D.   On: 07/05/2023 10:34   Medications:   (feeding supplement) PROSource Plus  30 mL Oral BID BM   [START ON 07/06/2023] aspirin  EC  81 mg Oral Daily   atorvastatin   80 mg Oral Daily   bisacodyl   10 mg Oral Daily   Or   bisacodyl   10 mg Rectal Daily   calcium  acetate  2,001 mg Oral TID with meals   carvedilol   12.5 mg Oral BID WC   Chlorhexidine  Gluconate Cloth  6 each Topical Q0600   [START ON 07/06/2023] clopidogrel   75 mg Oral Daily   darbepoetin (ARANESP ) injection - DIALYSIS  100 mcg Subcutaneous Q Mon-1800   docusate sodium   200 mg Oral Daily   feeding supplement (NEPRO CARB STEADY)  237  mL Oral BID BM   heparin  injection (subcutaneous)  5,000 Units Subcutaneous Q8H   insulin  aspart  0-15 Units Subcutaneous TID WC   insulin  glargine-yfgn  5 Units Subcutaneous BID   losartan   25 mg Oral Daily   pantoprazole   40 mg Oral Daily   sodium chloride  flush  3 mL Intravenous Q12H   sodium chloride  flush  3 mL Intravenous Q12H    Dialysis Orders MWF - [facility] 4hr, 450/A1.5, EDW 102.3kg, 2K/2Ca, AVF, no heparin  - Mircera 50mcg IV q 2 weeks (last 12/24) - Calcitriol  1.85mcg PO q HD   Assessment/Plan: NSTEMI/CAD -> CABG x 3 on 06/28/23: C/b hyperK post-op, improved with HD. ESRD: Continue HD on MWF schedule - will get HD tomorrow. HTN/volume: BP controlled. Imaging with pulm edema/small effusion - work volume down with HD. 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 S/p 2U PRBCs this admit. Secondary HPTH: Ca fine, Phos high -  receiving binders more consistently now - follow. Nutrition: Alb low, continue protein supps.  Signe Sick, PA-S Izetta Boehringer, PA-C 07/05/2023, 10:49 AM  [facility]

## 2023-07-06 ENCOUNTER — Telehealth: Payer: Self-pay | Admitting: Thoracic Surgery (Cardiothoracic Vascular Surgery)

## 2023-07-06 LAB — RENAL FUNCTION PANEL
Albumin: 2.7 g/dL — ABNORMAL LOW (ref 3.5–5.0)
Anion gap: 13 (ref 5–15)
BUN: 53 mg/dL — ABNORMAL HIGH (ref 6–20)
CO2: 28 mmol/L (ref 22–32)
Calcium: 9.1 mg/dL (ref 8.9–10.3)
Chloride: 94 mmol/L — ABNORMAL LOW (ref 98–111)
Creatinine, Ser: 11.96 mg/dL — ABNORMAL HIGH (ref 0.61–1.24)
GFR, Estimated: 5 mL/min — ABNORMAL LOW (ref >60)
Glucose, Bld: 119 mg/dL — ABNORMAL HIGH (ref 70–99)
Phosphorus: 4.9 mg/dL — ABNORMAL HIGH (ref 2.5–4.6)
Potassium: 4.1 mmol/L (ref 3.5–5.1)
Sodium: 135 mmol/L (ref 135–145)

## 2023-07-06 LAB — GLUCOSE, CAPILLARY
Glucose-Capillary: 116 mg/dL — ABNORMAL HIGH (ref 70–99)
Glucose-Capillary: 145 mg/dL — ABNORMAL HIGH (ref 70–99)
Glucose-Capillary: 117 mg/dL — ABNORMAL HIGH (ref 70–99)

## 2023-07-06 MED ORDER — ANTICOAGULANT SODIUM CITRATE 4% (200MG/5ML) IV SOLN: 5.0000 mL | Status: DC | PRN

## 2023-07-06 MED ORDER — HEPARIN SODIUM (PORCINE) 1000 UNIT/ML DIALYSIS: 1000.0000 [IU] | INTRAMUSCULAR | Status: DC | PRN

## 2023-07-06 MED ORDER — LIDOCAINE-PRILOCAINE 2.5-2.5 % EX CREA: 1.0000 | TOPICAL_CREAM | CUTANEOUS | Status: DC | PRN

## 2023-07-06 MED ORDER — ALTEPLASE 2 MG IJ SOLR: 2.0000 mg | Freq: Once | INTRAMUSCULAR | Status: DC | PRN

## 2023-07-06 MED ORDER — LIDOCAINE HCL (PF) 1 % IJ SOLN: 5.0000 mL | INTRAMUSCULAR | Status: DC | PRN

## 2023-07-06 MED ORDER — PENTAFLUOROPROP-TETRAFLUOROETH EX AERO: 1.0000 | INHALATION_SPRAY | CUTANEOUS | Status: DC | PRN

## 2023-07-06 NOTE — Progress Notes (Signed)
 PT Cancellation Note  Patient Details Name: Masami Plata MRN: [id number] DOB: 12/11/1970   Cancelled Treatment:    Reason Eval/Treat Not Completed: Patient at procedure or test/unavailable (Pt in HD.  Will check back as able.)   Stephane JULIANNA Bevel 07/06/2023, 10:08 AM Lazette Estala M,PT Acute Rehab Services [phone]

## 2023-07-06 NOTE — Progress Notes (Signed)
 CARDIAC REHAB PHASE I     Post OHS education including site care, restrictions, heart healthy diabetic diet, sternal precautions, IS use at home, home needs at discharge, exercise guidelines and CRP2 reviewed with [name], pt mother, with pt consent. Pt currently in HD.  All questions and concerns addressed. Will refer to [facility] for CRP2.   [id number] Vaughn Asberry Hacking, RN BSN 07/06/2023 9:39 AM

## 2023-07-06 NOTE — Progress Notes (Addendum)
 [address]       [place] [address]             [phone]      8 Days Post-Op Procedure(s) (LRB): CORONARY ARTERY BYPASS GRAFTING TIMES THREE USING LEFT INTERNAL MAMMARY ARTERY AND RIGHT GREAT SAPHENOUS VEIN HARVESTED ENDOSCOPICALLY (N/A) TRANSESOPHAGEAL ECHOCARDIOGRAM (TEE) (N/A) Subjective: Awake, sitting up in bed preparing to have breakfast.  Says he is feeling OK and has no new concerns.  Hemodialysis planned for today.   Objective: Vital signs in last 24 hours: Temp:  [97.9 F (36.6 C)-98.4 F (36.9 C)] 98.4 F (36.9 C) (01/10 0625) Pulse Rate:  [78-95] 78 (01/10 0625) Cardiac Rhythm: Atrial flutter (01/09 1915) Resp:  [15-22] 15 (01/10 0625) BP: (103-126)/(63-87) 105/63 (01/10 0625) SpO2:  [97 %-100 %] 100 % (01/10 0625) Weight:  [101.3 kg] 101.3 kg (01/10 0319)    Intake/Output from previous day: 01/09 0701 - 01/10 0700 In: -  Out: 2500  Intake/Output this shift: No intake/output data recorded.  General appearance: alert, cooperative, and no distress Neurologic: visually impaired, otherwise no deficits Heart: RRR Lungs: breath sounds are clear Abdomen: soft, no tenderness, active bowel sounds Extremities: no peripheral edema.  Lab Results: Recent Labs    07/05/23 0338  WBC 10.5  HGB 8.3*  HCT 25.3*  PLT 214   BMET:  Recent Labs    07/05/23 0340 07/06/23 0324  NA 135 135  K 4.3 4.1  CL 93* 94*  CO2 26 28  GLUCOSE 109* 119*  BUN 77* 53*  CREATININE 16.21* 11.96*  CALCIUM  9.0 9.1    PT/INR: No results for input(s): LABPROT, INR in the last 72 hours. ABG    Component Value Date/Time   PHART 7.345 (L) 06/28/2023 2349   HCO3 25.6 06/28/2023 2349   TCO2 27 06/28/2023 2349   ACIDBASEDEF 2.0 06/28/2023 2250   O2SAT 97 06/28/2023 2349   CBG (last 3)  Recent Labs    07/05/23 1713 07/05/23 2059 07/06/23 0628  GLUCAP 114* 117* 116*    Narrative & Impression  CLINICAL DATA:  Pleural effusion.    EXAM: CHEST - 2 VIEW   COMPARISON:  July 02, 2023.   FINDINGS: Stable cardiomegaly. Status post coronary artery bypass graft. Right lung is clear. Mild left basilar atelectasis or infiltrate is noted with probable small left pleural effusion. Bony thorax is unremarkable.   IMPRESSION: Mild left basilar subsegmental atelectasis or infiltrate is noted with probable small left pleural effusion.     Electronically Signed   By: Lynwood Landy Raddle M.D.   On: 07/05/2023 10:34    Assessment/Plan: S/P Procedure(s) (LRB): CORONARY ARTERY BYPASS GRAFTING TIMES THREE USING LEFT INTERNAL MAMMARY ARTERY AND RIGHT GREAT SAPHENOUS VEIN HARVESTED ENDOSCOPICALLY (N/A) TRANSESOPHAGEAL ECHOCARDIOGRAM (TEE) (N/A)  -POD8 CABG x 3 after presenting with NSTEMI, EF 40%. On asa 81mg , Plavix , Coreg , cozaar , and atorvastatin .  BP and cardiac rhythm stable.   -RENAL - ESRD, HD planned again for today to re-establish M-W-F schedule.  K+ 4.1, BUN 53.  -ENDO- type 2 DM- Glucose controlled on [place]  5u BID and SSI.   -PULM- Maintaining O2 sats on 2-3L [place] O2, desats easily with activity. CXR yesterday showing left basilar ATX and small effusion.  Encouraging pulmonary hygiene.  We have arranged for home O2 therapy.   -GI- tolerating PO's, having bowel movements.   -DVT PPX- on [place] heparin  5000 units q8h, ambulate as able.   -Disposition- SNF recommended by therapists  but he prefers to return home with his mother and son. He requests to not be discharged immediately after dialysis because of the fatigue he usually experiences.  Doubtful he will be able to travel tomorrow due to the severe weather expected. Planning for discharge Sunday.    LOS: 11 days    Myron G. Roddenberry, PA-C 07/06/2023  Agree with above Dispo planning  Toshiyuki Fredell O Phenix Grein

## 2023-07-06 NOTE — Progress Notes (Signed)
 [facility] Progress Note   Subjective:   Seen in HD - patient resting in bed. UFG 3L. Patient sleepy but able to answer questions. He reports feeling slightly better and more energized than yesterday. Reports intermittent shortness of breath and chest pain, but denies any currently. Denies N/V. 4L O2 via [place] in place.   Objective Vitals:   07/06/23 0755 07/06/23 1003 07/06/23 1015 07/06/23 1030  BP: 137/73 127/65 122/68 138/70  Pulse: 86 75 71 75  Resp: 20 15 12 19   Temp: 97.7 F (36.5 C) (!) 97.1 F (36.2 C)    TempSrc: Oral     SpO2:  100% 100% 100%  Weight: 101.5 kg 101.3 kg    Height:       Physical Exam General: Resting comfortably, NAD. Nasal O2 in place. Heart: RRR; no murmur. Healing midline incision.   Lungs: CTA anteriorly. Abdomen: soft, slightly distended.  Extremities: 1+ LE edema Dialysis Access:  RUE AVF +t/b   Additional Objective Labs: Basic Metabolic Panel: Recent Labs  Lab 07/04/23 0357 07/05/23 0340 07/06/23 0324  NA 135 135 135  K 4.3 4.3 4.1  CL 93* 93* 94*  CO2 27 26 28   GLUCOSE 110* 109* 119*  BUN 61* 77* 53*  CREATININE 13.96* 16.21* 11.96*  CALCIUM  8.9 9.0 9.1  PHOS 7.9* 7.6* 4.9*   Liver Function Tests: Recent Labs  Lab 07/04/23 0357 07/05/23 0340 07/06/23 0324  ALBUMIN  2.6* 2.6* 2.7*   CBC: Recent Labs  Lab 06/29/23 1718 07/01/23 0758 07/02/23 0708 07/05/23 0338  WBC 10.3 10.1 10.2 10.5  NEUTROABS  --   --   --  7.7  HGB 7.9* 6.7* 7.8* 8.3*  HCT 24.7* 20.7* 24.1* 25.3*  MCV 101.6* 101.5* 97.6 97.3  PLT 113* 105* 124* 214   CBG: Recent Labs  Lab 07/05/23 0856 07/05/23 1121 07/05/23 1713 07/05/23 2059 07/06/23 0628  GLUCAP 111* 172* 114* 117* 116*   Studies/Results: DG Chest 2 View Result Date: 07/05/2023 CLINICAL DATA:  Pleural effusion. EXAM: CHEST - 2 VIEW COMPARISON:  July 02, 2023. FINDINGS: Stable cardiomegaly. Status post coronary artery bypass graft. Right lung is clear. Mild left  basilar atelectasis or infiltrate is noted with probable small left pleural effusion. Bony thorax is unremarkable. IMPRESSION: Mild left basilar subsegmental atelectasis or infiltrate is noted with probable small left pleural effusion. Electronically Signed   By: Lynwood Landy Raddle M.D.   On: 07/05/2023 10:34   Medications:  anticoagulant sodium citrate       (feeding supplement) PROSource Plus  30 mL Oral BID BM   aspirin  EC  81 mg Oral Daily   atorvastatin   80 mg Oral Daily   bisacodyl   10 mg Oral Daily   Or   bisacodyl   10 mg Rectal Daily   calcium  acetate  2,001 mg Oral TID with meals   carvedilol   12.5 mg Oral BID WC   Chlorhexidine  Gluconate Cloth  6 each Topical Q0600   Chlorhexidine  Gluconate Cloth  6 each Topical Q0600   clopidogrel   75 mg Oral Daily   darbepoetin (ARANESP ) injection - DIALYSIS  100 mcg Subcutaneous Q Mon-1800   docusate sodium   200 mg Oral Daily   feeding supplement (NEPRO CARB STEADY)  237 mL Oral BID BM   heparin  injection (subcutaneous)  5,000 Units Subcutaneous Q8H   insulin  aspart  0-15 Units Subcutaneous TID WC   insulin  glargine-yfgn  5 Units Subcutaneous BID   losartan   25 mg Oral Daily   pantoprazole   40 mg Oral Daily   sodium chloride  flush  3 mL Intravenous Q12H   sodium chloride  flush  3 mL Intravenous Q12H    Dialysis Orders MWF - [facility] 4hr, 450/A1.5, EDW 102.3kg, 2K/2Ca, AVF, no heparin  - Mircera 50mcg IV q 2 weeks (last 12/24) - Calcitriol  1.30mcg PO q HD   Assessment/Plan: NSTEMI/CAD -> CABG x 3 on 06/28/23: C/b hyperK post-op, improved with HD. ESRD: Continue HD on MWF schedule - HD today, UFG 3L. HTN/volume: BP controlled. Imaging with pulm edema/small effusion - work volume down with HD. 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 S/p 2U PRBCs this admit. Secondary HPTH: Ca fine, Phos normalized - receiving binders more consistently now.  Nutrition: Alb low, continue protein supps.  Signe Sick, PA-S Izetta Boehringer,  PA-C 07/06/2023, 11:04 AM  [facility]

## 2023-07-06 NOTE — Procedures (Signed)
 HD Note:  Some information was entered later than the data was gathered due to patient care needs. The stated time with the data is accurate.  Received report for patient from off going dialysis nurse.  Patient stable and just medicated for surgical pain in his chest.  See [facility]  Alert and oriented.   Informed consent signed and in chart.   Access used: Right upper arm fistula Access issues: None  Patient tolerated treatment well.   TX duration: 3 hours  Alert, without acute distress.  Total UF removed: 3000 ml  Hand-off given to patient's nurse.   Transported back to the room   Shacoya Burkhammer L. Lenon, RN [facility].

## 2023-07-07 LAB — RENAL FUNCTION PANEL
Albumin: 2.9 g/dL — ABNORMAL LOW (ref 3.5–5.0)
Anion gap: 12 (ref 5–15)
BUN: 39 mg/dL — ABNORMAL HIGH (ref 6–20)
CO2: 29 mmol/L (ref 22–32)
Calcium: 9.2 mg/dL (ref 8.9–10.3)
Chloride: 93 mmol/L — ABNORMAL LOW (ref 98–111)
Creatinine, Ser: 9.65 mg/dL — ABNORMAL HIGH (ref 0.61–1.24)
GFR, Estimated: 6 mL/min — ABNORMAL LOW (ref >60)
Glucose, Bld: 146 mg/dL — ABNORMAL HIGH (ref 70–99)
Phosphorus: 4.8 mg/dL — ABNORMAL HIGH (ref 2.5–4.6)
Potassium: 4.5 mmol/L (ref 3.5–5.1)
Sodium: 134 mmol/L — ABNORMAL LOW (ref 135–145)

## 2023-07-07 LAB — GLUCOSE, CAPILLARY
Glucose-Capillary: 123 mg/dL — ABNORMAL HIGH (ref 70–99)
Glucose-Capillary: 114 mg/dL — ABNORMAL HIGH (ref 70–99)
Glucose-Capillary: 117 mg/dL — ABNORMAL HIGH (ref 70–99)

## 2023-07-07 NOTE — Progress Notes (Signed)
 [facility] Progress Note   Subjective:   Patient seen and examined at bedside.  Reports intermittent CP that is worsened with hiccups. No other specific complaints.  Denies CP, SOB, abdominal pain and n/v/d.    Objective Vitals:   07/07/23 0607 07/07/23 0624 07/07/23 0817 07/07/23 1200  BP:   (!) 113/56 132/79  Pulse:   80 83  Resp:   15 15  Temp:   98.2 F (36.8 C) 98.2 F (36.8 C)  TempSrc:   Oral Oral  SpO2:  100% 100% 100%  Weight: 97.5 kg     Height:       Physical Exam General:chronically ill appearing male in NAD Heart:RRR Lungs:CTAB, nml WOB on RA Abdomen:soft, NTND Extremities:trace LE edema Dialysis Access: RU AVF +b/t   Filed Weights   07/06/23 0755 07/06/23 1003 07/07/23 0607  Weight: 101.5 kg 101.3 kg 97.5 kg    Intake/Output Summary (Last 24 hours) at 07/07/2023 1355 Last data filed at 07/07/2023 0609 Gross per 24 hour  Intake 1080 ml  Output 3000 ml  Net -1920 ml    Additional Objective Labs: Basic Metabolic Panel: Recent Labs  Lab 07/05/23 0340 07/06/23 0324 07/07/23 0246  NA 135 135 134*  K 4.3 4.1 4.5  CL 93* 94* 93*  CO2 26 28 29   GLUCOSE 109* 119* 146*  BUN 77* 53* 39*  CREATININE 16.21* 11.96* 9.65*  CALCIUM  9.0 9.1 9.2  PHOS 7.6* 4.9* 4.8*   Liver Function Tests: Recent Labs  Lab 07/05/23 0340 07/06/23 0324 07/07/23 0246  ALBUMIN  2.6* 2.7* 2.9*   CBC: Recent Labs  Lab 07/01/23 0758 07/02/23 0708 07/05/23 0338  WBC 10.1 10.2 10.5  NEUTROABS  --   --  7.7  HGB 6.7* 7.8* 8.3*  HCT 20.7* 24.1* 25.3*  MCV 101.5* 97.6 97.3  PLT 105* 124* 214   Blood Culture    Component Value Date/Time   SDES BLOOD LEFT ANTECUBITAL 05/09/2023 1822   SPECREQUEST  05/09/2023 1822    BOTTLES DRAWN AEROBIC ONLY Blood Culture adequate volume   CULT  05/09/2023 1822    NO GROWTH 5 DAYS Performed at [facility], [address] [place], [place] [address]    REPTSTATUS 05/14/2023 FINAL 05/09/2023 1822    CBG: Recent Labs  Lab 07/06/23 0628 07/06/23 1538 07/06/23 2133 07/07/23 0632 07/07/23 1219  GLUCAP 116* 145* 117* 123* 114*    Medications:   (feeding supplement) PROSource Plus  30 mL Oral BID BM   aspirin  EC  81 mg Oral Daily   atorvastatin   80 mg Oral Daily   bisacodyl   10 mg Oral Daily   Or   bisacodyl   10 mg Rectal Daily   calcium  acetate  2,001 mg Oral TID with meals   carvedilol   12.5 mg Oral BID WC   Chlorhexidine  Gluconate Cloth  6 each Topical Q0600   clopidogrel   75 mg Oral Daily   darbepoetin (ARANESP ) injection - DIALYSIS  100 mcg Subcutaneous Q Mon-1800   docusate sodium   200 mg Oral Daily   feeding supplement (NEPRO CARB STEADY)  237 mL Oral BID BM   heparin  injection (subcutaneous)  5,000 Units Subcutaneous Q8H   insulin  aspart  0-15 Units Subcutaneous TID WC   insulin  glargine-yfgn  5 Units Subcutaneous BID   losartan   25 mg Oral Daily   pantoprazole   40 mg Oral Daily   sodium chloride  flush  3 mL Intravenous Q12H    Dialysis Orders: MWF - [facility]  4hr, 450/A1.5, EDW 102.3kg, 2K/2Ca, AVF, no heparin  - Mircera 50mcg IV q 2 weeks (last 12/24) - Calcitriol  1.59mcg PO q HD   Assessment/Plan: NSTEMI/CAD -> CABG x 3 on 06/28/23: C/b hyperK post-op, improved with HD.  Likely to d/c tomorrow per CTS.  ESRD: Continue HD on MWF schedule - Next HD on 07/08/22. HTN/volume: BP controlled. Imaging 1/9 with pulm edema/small effusion - work volume down with HD.  Continue UF as tolerated.  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 S/p 2U PRBCs this admit. Secondary HPTH: Ca and phos in goal.  Continue VDRA and binders.  Nutrition: Alb low, continue protein supps.  Manuelita Labella, PA-C [facility] 07/07/2023,1:55 PM  LOS: 12 days

## 2023-07-07 NOTE — Progress Notes (Addendum)
 9 Days Post-Op Procedure(s) (LRB): CORONARY ARTERY BYPASS GRAFTING TIMES THREE USING LEFT INTERNAL MAMMARY ARTERY AND RIGHT GREAT SAPHENOUS VEIN HARVESTED ENDOSCOPICALLY (N/A) TRANSESOPHAGEAL ECHOCARDIOGRAM (TEE) (N/A) Subjective: Feels well  Objective: Vital signs in last 24 hours: Temp:  [97.1 F (36.2 C)-98.5 F (36.9 C)] 98.2 F (36.8 C) (01/11 0817) Pulse Rate:  [71-84] 80 (01/11 0817) Cardiac Rhythm: Normal sinus rhythm (01/10 1913) Resp:  [10-25] 15 (01/11 0817) BP: (82-138)/(40-73) 113/56 (01/11 0817) SpO2:  [84 %-100 %] 100 % (01/11 0817) Weight:  [97.5 kg-101.3 kg] 97.5 kg (01/11 0607)  Hemodynamic parameters for last 24 hours:    Intake/Output from previous day: 01/10 0701 - 01/11 0700 In: 1180 [P.O.:1180] Out: 3000  Intake/Output this shift: No intake/output data recorded.  General appearance: alert, cooperative, and no distress Heart: regular rate and rhythm Lungs: mildly dim in left base Abdomen: benign Extremities: no edema Wound: incis healing well  Lab Results: Recent Labs    07/05/23 0338  WBC 10.5  HGB 8.3*  HCT 25.3*  PLT 214   BMET:  Recent Labs    07/06/23 0324 07/07/23 0246  NA 135 134*  K 4.1 4.5  CL 94* 93*  CO2 28 29  GLUCOSE 119* 146*  BUN 53* 39*  CREATININE 11.96* 9.65*  CALCIUM  9.1 9.2    PT/INR: No results for input(s): LABPROT, INR in the last 72 hours. ABG    Component Value Date/Time   PHART 7.345 (L) 06/28/2023 2349   HCO3 25.6 06/28/2023 2349   TCO2 27 06/28/2023 2349   ACIDBASEDEF 2.0 06/28/2023 2250   O2SAT 97 06/28/2023 2349   CBG (last 3)  Recent Labs    07/06/23 1538 07/06/23 2133 07/07/23 0632  GLUCAP 145* 117* 123*    Meds Scheduled Meds:  (feeding supplement) PROSource Plus  30 mL Oral BID BM   aspirin  EC  81 mg Oral Daily   atorvastatin   80 mg Oral Daily   bisacodyl   10 mg Oral Daily   Or   bisacodyl   10 mg Rectal Daily   calcium  acetate  2,001 mg Oral TID with meals   carvedilol    12.5 mg Oral BID WC   Chlorhexidine  Gluconate Cloth  6 each Topical Q0600   Chlorhexidine  Gluconate Cloth  6 each Topical Q0600   clopidogrel   75 mg Oral Daily   darbepoetin (ARANESP ) injection - DIALYSIS  100 mcg Subcutaneous Q Mon-1800   docusate sodium   200 mg Oral Daily   feeding supplement (NEPRO CARB STEADY)  237 mL Oral BID BM   heparin  injection (subcutaneous)  5,000 Units Subcutaneous Q8H   insulin  aspart  0-15 Units Subcutaneous TID WC   insulin  glargine-yfgn  5 Units Subcutaneous BID   losartan   25 mg Oral Daily   pantoprazole   40 mg Oral Daily   sodium chloride  flush  3 mL Intravenous Q12H   sodium chloride  flush  3 mL Intravenous Q12H   Continuous Infusions: PRN Meds:.calcium  carbonate, dextrose , fentaNYL  (SUBLIMAZE ) injection, hydrALAZINE , metoprolol  tartrate, ondansetron  (ZOFRAN ) IV, oxyCODONE , sodium chloride  flush, sodium chloride  flush, traMADol   Xrays DG Chest 2 View Result Date: 07/05/2023 CLINICAL DATA:  Pleural effusion. EXAM: CHEST - 2 VIEW COMPARISON:  July 02, 2023. FINDINGS: Stable cardiomegaly. Status post coronary artery bypass graft. Right lung is clear. Mild left basilar atelectasis or infiltrate is noted with probable small left pleural effusion. Bony thorax is unremarkable. IMPRESSION: Mild left basilar subsegmental atelectasis or infiltrate is noted with probable small left pleural effusion. Electronically Signed  By: Lynwood Landy Raddle M.D.   On: 07/05/2023 10:34    Assessment/Plan: S/P Procedure(s) (LRB): CORONARY ARTERY BYPASS GRAFTING TIMES THREE USING LEFT INTERNAL MAMMARY ARTERY AND RIGHT GREAT SAPHENOUS VEIN HARVESTED ENDOSCOPICALLY (N/A) TRANSESOPHAGEAL ECHOCARDIOGRAM (TEE) (N/A) POD#9  1 afeb, S BP 80's-130's, down during and early after dialysis, sinus rhythm 2 sats good on RA 3 CBG's good control 4 ESRD as per nephrology, had dialysis yesterday 5 likely home in am   LOS: 12 days    Lemond FORBES Cera PA-C Pager [phone] 07/07/2023    Chart reviewed, patient examined, agree with above.  Doing well overall. Should be good to go home tomorrow.

## 2023-07-08 LAB — RENAL FUNCTION PANEL
Albumin: 2.8 g/dL — ABNORMAL LOW (ref 3.5–5.0)
Anion gap: 19 — ABNORMAL HIGH (ref 5–15)
BUN: 58 mg/dL — ABNORMAL HIGH (ref 6–20)
CO2: 24 mmol/L (ref 22–32)
Calcium: 9.5 mg/dL (ref 8.9–10.3)
Chloride: 89 mmol/L — ABNORMAL LOW (ref 98–111)
Creatinine, Ser: 11.86 mg/dL — ABNORMAL HIGH (ref 0.61–1.24)
GFR, Estimated: 5 mL/min — ABNORMAL LOW (ref >60)
Glucose, Bld: 113 mg/dL — ABNORMAL HIGH (ref 70–99)
Phosphorus: 5.8 mg/dL — ABNORMAL HIGH (ref 2.5–4.6)
Potassium: 4.8 mmol/L (ref 3.5–5.1)
Sodium: 132 mmol/L — ABNORMAL LOW (ref 135–145)

## 2023-07-08 LAB — GLUCOSE, CAPILLARY
Glucose-Capillary: 107 mg/dL — ABNORMAL HIGH (ref 70–99)
Glucose-Capillary: 95 mg/dL (ref 70–99)
Glucose-Capillary: 120 mg/dL — ABNORMAL HIGH (ref 70–99)

## 2023-07-08 MED ORDER — CLOPIDOGREL BISULFATE 75 MG PO TABS: 75.0000 mg | ORAL_TABLET | Freq: Every day | ORAL | 1 refills | Status: DC

## 2023-07-08 MED ORDER — GABAPENTIN 100 MG PO CAPS: 100.0000 mg | ORAL_CAPSULE | Freq: Two times a day (BID) | ORAL | Status: DC | PRN

## 2023-07-08 MED ORDER — CALCIUM ACETATE (PHOS BINDER) 667 MG PO CAPS: 2001.0000 mg | ORAL_CAPSULE | Freq: Three times a day (TID) | ORAL | 1 refills | Status: AC

## 2023-07-08 MED ORDER — ASPIRIN 81 MG PO TBEC: 81.0000 mg | DELAYED_RELEASE_TABLET | Freq: Every day | ORAL | Status: DC

## 2023-07-08 MED ORDER — OXYCODONE HCL 5 MG PO TABS: 5.0000 mg | ORAL_TABLET | Freq: Four times a day (QID) | ORAL | 0 refills | Status: DC | PRN

## 2023-07-08 MED ORDER — CARVEDILOL 12.5 MG PO TABS: 12.5000 mg | ORAL_TABLET | Freq: Two times a day (BID) | ORAL | 1 refills | Status: DC

## 2023-07-08 NOTE — Progress Notes (Signed)
 [facility] Progress Note   Subjective:   Patient seen and examined at bedside. Admits to incisional CP.  Otherwise feeling ok.  Denies SOB, abdominal pain, edema, dizziness and n/v/d.  Plan to go home today.   Objective Vitals:   07/07/23 2315 07/08/23 0323 07/08/23 0800 07/08/23 0900  BP: 121/71 120/65 123/62   Pulse: 83 83    Resp: 15 16 15    Temp: 98.6 F (37 C) 98.2 F (36.8 C) 98.3 F (36.8 C)   TempSrc: Oral Oral Oral   SpO2: 93% 92%  91%  Weight:      Height:       Physical Exam General:chronically ill appearing, blind male in NAD Heart:RRR Lungs:CTAB, nml WOB on RA Abdomen:soft, NTND Extremities:no LE edema Dialysis Access: RU AVF +b/t   Filed Weights   07/06/23 0755 07/06/23 1003 07/07/23 0607  Weight: 101.5 kg 101.3 kg 97.5 kg   No intake or output data in the 24 hours ending 07/08/23 1056  Additional Objective Labs: Basic Metabolic Panel: Recent Labs  Lab 07/06/23 0324 07/07/23 0246 07/08/23 0305  NA 135 134* 132*  K 4.1 4.5 4.8  CL 94* 93* 89*  CO2 28 29 24   GLUCOSE 119* 146* 113*  BUN 53* 39* 58*  CREATININE 11.96* 9.65* 11.86*  CALCIUM  9.1 9.2 9.5  PHOS 4.9* 4.8* 5.8*   Liver Function Tests: Recent Labs  Lab 07/06/23 0324 07/07/23 0246 07/08/23 0305  ALBUMIN  2.7* 2.9* 2.8*   CBC: Recent Labs  Lab 07/02/23 0708 07/05/23 0338  WBC 10.2 10.5  NEUTROABS  --  7.7  HGB 7.8* 8.3*  HCT 24.1* 25.3*  MCV 97.6 97.3  PLT 124* 214   Blood Culture    Component Value Date/Time   SDES BLOOD LEFT ANTECUBITAL 05/09/2023 1822   SPECREQUEST  05/09/2023 1822    BOTTLES DRAWN AEROBIC ONLY Blood Culture adequate volume   CULT  05/09/2023 1822    NO GROWTH 5 DAYS Performed at [facility], [address], [place], [place] [address]    REPTSTATUS 05/14/2023 FINAL 05/09/2023 1822    Medications:   (feeding supplement) PROSource Plus  30 mL Oral BID BM   aspirin  EC  81 mg Oral Daily   atorvastatin   80 mg Oral Daily    bisacodyl   10 mg Oral Daily   Or   bisacodyl   10 mg Rectal Daily   calcium  acetate  2,001 mg Oral TID with meals   carvedilol   12.5 mg Oral BID WC   Chlorhexidine  Gluconate Cloth  6 each Topical Q0600   clopidogrel   75 mg Oral Daily   darbepoetin (ARANESP ) injection - DIALYSIS  100 mcg Subcutaneous Q Mon-1800   docusate sodium   200 mg Oral Daily   feeding supplement (NEPRO CARB STEADY)  237 mL Oral BID BM   heparin  injection (subcutaneous)  5,000 Units Subcutaneous Q8H   insulin  aspart  0-15 Units Subcutaneous TID WC   insulin  glargine-yfgn  5 Units Subcutaneous BID   losartan   25 mg Oral Daily   pantoprazole   40 mg Oral Daily   sodium chloride  flush  3 mL Intravenous Q12H    Dialysis Orders: MWF - [facility] 4hr, 450/A1.5, EDW 102.3kg, 2K/2Ca, AVF, no heparin  - Mircera 50mcg IV q 2 weeks (last 12/24) - Calcitriol  1.71mcg PO q HD   Assessment/Plan: NSTEMI/CAD -> CABG x 3 on 06/28/23: C/b hyperK post-op, improved with HD.  Stable for D/C per CTS.  ESRD: Continue HD on MWF schedule -  Next HD on 07/08/22. HTN/volume: BP controlled. Imaging 1/9 with pulm edema/small effusion - work volume down with HD.  Continue UF as tolerated.  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 S/p 2U PRBCs this admit. Secondary HPTH: Ca in goal. Phos a little elevated.  Continue VDRA and binders.  Nutrition: Alb low, continue protein supps.  Manuelita Labella, PA-C [facility] 07/08/2023,10:56 AM  LOS: 13 days

## 2023-07-08 NOTE — Progress Notes (Signed)
D/c tele and IV. Went over AVS with pt and all questions were addressed.   Lavenia Atlas, RN

## 2023-07-08 NOTE — TOC Transition Note (Signed)
 Transition of Care [facility]) - Discharge Note   Patient Details  Name: Corey Park MRN: [id number] Date of Birth: [birth year]  [facility] CM/SW Contact:  Robynn Eileen Hoose, RN Phone Number: 07/08/2023, 8:27 AM   Clinical Narrative:   Patient is being discharged today. Heather with [facility] made aware.    Final next level of care: Home w Home Health Services Barriers to Discharge: No Barriers Identified   Patient Goals and CMS Choice Patient states their goals for this hospitalization and ongoing recovery are:: wants to recover CMS Medicare.gov Compare Post Acute Care list provided to:: Patient Choice offered to / list presented to : Patient      Discharge Placement                       Discharge Plan and Services Additional resources added to the After Visit Summary for   In-house Referral: Clinical Social Work Discharge Planning Services: CM Consult Post Acute Care Choice: Durable Medical Equipment, Home Health          DME Arranged: Oxygen, Walker rolling DME Agency: [facility] Date DME Agency Contacted: 07/04/23 Time DME Agency Contacted: 1435 Representative spoke with at DME Agency: [facility] HH Arranged: RN, PT, OT, Nurse's Aide HH Agency: [facility] ([facility]) Date HH Agency Contacted: 07/04/23 Time HH Agency Contacted: 1436 Representative spoke with at [facility] Agency: [facility]  Social Drivers of Health (SDOH) Interventions SDOH Screenings   Food Insecurity: No Food Insecurity (06/26/2023)  Housing: Unknown (06/26/2023)  Recent Concern: Housing - Medium Risk (04/02/2023)  Transportation Needs: No Transportation Needs (06/26/2023)  Recent Concern: Transportation Needs - Unmet Transportation Needs (05/09/2023)  Utilities: Not At Risk (06/26/2023)  Depression (PHQ2-9): Low Risk  (08/10/2022)  Tobacco Use: Low Risk  (06/28/2023)     Readmission Risk Interventions    06/29/2023    1:27 PM 12/13/2022   10:39 AM  Readmission Risk  Prevention Plan  Transportation Screening Complete Complete  PCP or Specialist Appt within 3-5 Days  Complete  HRI or Home Care Consult  Complete  Social Work Consult for Recovery Care Planning/Counseling  Complete  Palliative Care Screening  Complete  Medication Review Oceanographer) Referral to Pharmacy Referral to Pharmacy  HRI or Home Care Consult Complete   SW Recovery Care/Counseling Consult Complete   Palliative Care Screening Not Applicable

## 2023-07-08 NOTE — Progress Notes (Signed)
 10 Days Post-Op Procedure(s) (LRB): CORONARY ARTERY BYPASS GRAFTING TIMES THREE USING LEFT INTERNAL MAMMARY ARTERY AND RIGHT GREAT SAPHENOUS VEIN HARVESTED ENDOSCOPICALLY (N/A) TRANSESOPHAGEAL ECHOCARDIOGRAM (TEE) (N/A) Subjective: Feels ok, no new c/o  Objective: Vital signs in last 24 hours: Temp:  [98.2 F (36.8 C)-98.6 F (37 C)] 98.2 F (36.8 C) (01/12 0323) Pulse Rate:  [80-87] 83 (01/12 0323) Cardiac Rhythm: Normal sinus rhythm (01/11 2140) Resp:  [15-16] 16 (01/12 0323) BP: (113-132)/(56-79) 120/65 (01/12 0323) SpO2:  [92 %-100 %] 92 % (01/12 0323)  Hemodynamic parameters for last 24 hours:    Intake/Output from previous day: No intake/output data recorded. Intake/Output this shift: No intake/output data recorded.  General appearance: alert, cooperative, and no distress Heart: regular rate and rhythm Lungs: clear to auscultation bilaterally Abdomen: benign Extremities: no edema Wound: incis healing well  Lab Results: No results for input(s): WBC, HGB, HCT, PLT in the last 72 hours. BMET:  Recent Labs    07/07/23 0246 07/08/23 0305  NA 134* 132*  K 4.5 4.8  CL 93* 89*  CO2 29 24  GLUCOSE 146* 113*  BUN 39* 58*  CREATININE 9.65* 11.86*  CALCIUM  9.2 9.5    PT/INR: No results for input(s): LABPROT, INR in the last 72 hours. ABG    Component Value Date/Time   PHART 7.345 (L) 06/28/2023 2349   HCO3 25.6 06/28/2023 2349   TCO2 27 06/28/2023 2349   ACIDBASEDEF 2.0 06/28/2023 2250   O2SAT 97 06/28/2023 2349   CBG (last 3)  Recent Labs    07/07/23 1746 07/07/23 2131 07/08/23 0601  GLUCAP 117* 107* 95    Meds Scheduled Meds:  (feeding supplement) PROSource Plus  30 mL Oral BID BM   aspirin  EC  81 mg Oral Daily   atorvastatin   80 mg Oral Daily   bisacodyl   10 mg Oral Daily   Or   bisacodyl   10 mg Rectal Daily   calcium  acetate  2,001 mg Oral TID with meals   carvedilol   12.5 mg Oral BID WC   Chlorhexidine  Gluconate Cloth  6 each  Topical Q0600   clopidogrel   75 mg Oral Daily   darbepoetin (ARANESP ) injection - DIALYSIS  100 mcg Subcutaneous Q Mon-1800   docusate sodium   200 mg Oral Daily   feeding supplement (NEPRO CARB STEADY)  237 mL Oral BID BM   heparin  injection (subcutaneous)  5,000 Units Subcutaneous Q8H   insulin  aspart  0-15 Units Subcutaneous TID WC   insulin  glargine-yfgn  5 Units Subcutaneous BID   losartan   25 mg Oral Daily   pantoprazole   40 mg Oral Daily   sodium chloride  flush  3 mL Intravenous Q12H   Continuous Infusions: PRN Meds:.calcium  carbonate, dextrose , fentaNYL  (SUBLIMAZE ) injection, hydrALAZINE , metoprolol  tartrate, ondansetron  (ZOFRAN ) IV, oxyCODONE , sodium chloride  flush, traMADol   Xrays No results found.  Assessment/Plan: S/P Procedure(s) (LRB): CORONARY ARTERY BYPASS GRAFTING TIMES THREE USING LEFT INTERNAL MAMMARY ARTERY AND RIGHT GREAT SAPHENOUS VEIN HARVESTED ENDOSCOPICALLY (N/A) TRANSESOPHAGEAL ECHOCARDIOGRAM (TEE) (N/A) POD#10  1 afeb, VSS, SR 2 sats good on RA 3 nephrology conts to manage ESRD 4 stable for D/C   LOS: 13 days    Lemond FORBES Cera PA-C Pager [phone] 07/08/2023

## 2023-07-08 NOTE — Progress Notes (Signed)
 Occupational Therapy Treatment Patient Details Name: Corey Park MRN: [id number] DOB: 1970/10/17 Today's Date: 07/08/2023   History of present illness Pt is a 53 y/o male presenting on 06/25/23 with acute central chest pain and dyspnea. Pt with NSTEMI and underwent CABG x3 on 06/28/23. Had hyperkalemia post surgery and underwent urgent HD. 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   OT comments  Patient demonstrating good gains with OT treatment with bed mobility, transfers, and standing at sink for bathing tasks. Patient on RA with SpO2 92-94% while standing at sink. Discharge recommendations continue to be appropriate for [facility] with family to provided assistance. Acute OT to continue to follow.       If plan is discharge home, recommend the following:  A little help with walking and/or transfers;A little help with bathing/dressing/bathroom;Assistance with cooking/housework;Assist for transportation;Help with stairs or ramp for entrance;Direct supervision/assist for medications management   Equipment Recommendations  Other (comment) (RW)    Recommendations for Other Services      Precautions / Restrictions Precautions Precautions: Sternal Precaution Booklet Issued: Yes (comment) Precaution Comments: verbally reviewed Restrictions Weight Bearing Restrictions Per Provider Order: Yes (Sternal precautions) RUE Weight Bearing Per Provider Order: Non weight bearing LUE Weight Bearing Per Provider Order: Non weight bearing Other Position/Activity Restrictions: sternal precautions       Mobility Bed Mobility Overal bed mobility: Needs Assistance Bed Mobility: Rolling, Sidelying to Sit Rolling: Contact guard assist Sidelying to sit: Min assist       General bed mobility comments: cues to maintain precautions    Transfers Overall transfer level: Needs assistance Equipment used: Rolling walker (2 wheels) Transfers: Sit to/from  Stand Sit to Stand: Contact guard assist, From elevated surface           General transfer comment: CGA for sit to stand from raised bed, patient stated height of bed at home, with patient holding pillow. min assist with use of RW     Balance Overall balance assessment: Needs assistance Sitting-balance support: No upper extremity supported, Feet supported Sitting balance-Leahy Scale: Good     Standing balance support: Single extremity supported, Bilateral upper extremity supported, During functional activity Standing balance-Leahy Scale: Poor Standing balance comment: reliant on UE support                           ADL either performed or assessed with clinical judgement   ADL Overall ADL's : Needs assistance/impaired     Grooming: Wash/dry hands;Wash/dry face;Oral care;Contact guard assist;Standing Grooming Details (indicate cue type and reason): at sink with cues to locate items Upper Body Bathing: Contact guard assist;Standing   Lower Body Bathing: Contact guard assist;Sit to/from stand Lower Body Bathing Details (indicate cue type and reason): for peri area bathing                       General ADL Comments: Patient stood at sink for self care on RA with SpO2 at 92-94%    Extremity/Trunk Assessment              Vision       Perception     Praxis      Cognition Arousal: Alert Behavior During Therapy: Flat affect Overall Cognitive Status: No family/caregiver present to determine baseline cognitive functioning  General Comments: able to recall sternal precautions        Exercises      Shoulder Instructions       General Comments BP 126/96 (104) on RA with SpO2 92-94%    Pertinent Vitals/ Pain       Pain Assessment Pain Assessment: Faces Faces Pain Scale: Hurts a little bit Pain Location: chest, generalized Pain Descriptors / Indicators: Discomfort, Grimacing, Operative site  guarding Pain Intervention(s): Limited activity within patient's tolerance, Monitored during session, Repositioned, Premedicated before session  Home Living                                          Prior Functioning/Environment              Frequency  Min 1X/week        Progress Toward Goals  OT Goals(current goals can now be found in the care plan section)  Progress towards OT goals: Progressing toward goals  Acute Rehab OT Goals Patient Stated Goal: go home OT Goal Formulation: With patient Time For Goal Achievement: 07/14/23 Potential to Achieve Goals: Good ADL Goals Pt Will Perform Upper Body Bathing: with contact guard assist;sitting (adhering to sternal precautions) Pt Will Perform Lower Body Bathing: with min assist;sitting/lateral leans;sit to/from stand (Adhering to sternal precautions) Pt Will Perform Upper Body Dressing: with contact guard assist;sitting (adhering to sternal precautions) Pt Will Perform Lower Body Dressing: with min assist;sitting/lateral leans;sit to/from stand (Adhering to Sternal precautions) Pt Will Transfer to Toilet: with min assist;ambulating;bedside commode (with least restrictive AD; adhering to sternal precautions) Pt Will Perform Toileting - Clothing Manipulation and hygiene: with min assist;sitting/lateral leans;sit to/from stand (adhering to sternal precautions)  Plan      Co-evaluation                 AM-PAC OT 6 Clicks Daily Activity     Outcome Measure   Help from another person eating meals?: A Little Help from another person taking care of personal grooming?: A Little Help from another person toileting, which includes using toliet, bedpan, or urinal?: A Little Help from another person bathing (including washing, rinsing, drying)?: A Little Help from another person to put on and taking off regular upper body clothing?: A Little Help from another person to put on and taking off regular lower body  clothing?: A Little 6 Click Score: 18    End of Session Equipment Utilized During Treatment: Rolling walker (2 wheels)  OT Visit Diagnosis: Other abnormalities of gait and mobility (R26.89);Other (comment) (decreased activity tolerance)   Activity Tolerance Patient tolerated treatment well   Patient Left in chair;with call bell/phone within reach   Nurse Communication Mobility status;Patient requests pain meds        Time: 0832-0904 OT Time Calculation (min): 32 min  Charges: OT General Charges $OT Visit: 1 Visit OT Treatments $Self Care/Home Management : 23-37 mins  Dick Laine, OTA Acute Rehabilitation Services  Office [phone]   Jeb LITTIE Laine 07/08/2023, 10:32 AM

## 2023-07-08 NOTE — Discharge Planning (Signed)
 [facility] Patient Discharge Orders- [facility] CLINIC: [facility]  Patient's name: Corey Park Admit/DC Dates: 06/25/2023 - 07/08/2023  Discharge Diagnoses: NSTEMI - s/p CABG x3 on 06/28/23 Pulmonary edema, pulmonary effusion    Aranesp : Given: yes on 1/6   Last Hgb: 8.3 PRBC's Given: yes Date/# of units: 2 units, last on 07/01/23 ESA dose for discharge: mircera 100 mcg IV q 2 weeks, next 07/09/23 IV Iron  dose at discharge: none  Heparin  change: no  EDW Change: yes - 101 kg   Bath Change: no  Access intervention/Change: no Details:  Hectorol/Calcitriol  change: no  Discharge Labs: Calcium  9.5 Phosphorus 5.8 Albumin  2.8 K+ 4.8  Start ONSP  IV Antibiotics: no Details:  On Coumadin?: no Last INR: Next INR: Managed By:   OTHER/APPTS/LAB ORDERS:    D/C Meds to be reconciled by nurse after every discharge.  Completed By: Manuelita Labella, PA-C   Reviewed by: MD:______ RN_______

## 2023-07-09 ENCOUNTER — Telehealth: Payer: Self-pay | Admitting: Nephrology

## 2023-07-09 ENCOUNTER — Telehealth: Payer: Self-pay

## 2023-07-09 NOTE — Telephone Encounter (Signed)
 FMLA Form completed and faxed to Britta Mccreedy @ [employer]- [phone]./ Beginning LOA 06/25/23 through 09/24/23./ DOS 06/28/23.

## 2023-07-09 NOTE — Transitions of Care (Post Inpatient/ED Visit) (Signed)
 07/09/2023  Name: Najae Rathert MRN: [id number] DOB: [birth year]  Today's TOC FU Call Status: Today's TOC FU Call Status:: Successful TOC FU Call Completed TOC FU Call Complete Date: 07/09/23 Patient's Name and Date of Birth confirmed.  Transition Care Management Follow-up Telephone Call Date of Discharge: 07/08/23 Discharge Facility: [facility]) Type of Discharge: Inpatient Admission Primary Inpatient Discharge Diagnosis:: NSTEMI, underwent CABG x 3 How have you been since you were released from the hospital?: Better Any questions or concerns?: No  Items Reviewed: Did you receive and understand the discharge instructions provided?: Yes Medications obtained,verified, and reconciled?: Yes (Medications Reviewed) Any new allergies since your discharge?: No Dietary orders reviewed?: Yes Type of Diet Ordered:: Low sodium, heart healthy diet Do you have support at home?: Yes  Medications Reviewed Today: Medications Reviewed Today     Reviewed by Gordy Channing LABOR, RN (Registered Nurse) on 07/09/23 at 1053  Med List Status: <None>   Medication Order Taking? Sig Documenting Provider Last Dose Status Informant  alum & mag hydroxide-simeth (MAALOX/MYLANTA) 200-200-20 MG/5ML suspension [id number] Yes Take 30 mLs by mouth every 6 (six) hours as needed for indigestion or heartburn. [provider] Taking Active Self, Child, Pharmacy Records  aspirin  EC 81 MG tablet [id number] Yes Take 1 tablet (81 mg total) by mouth daily. Swallow whole. Viviane Lemond BRAVO, PA-C Taking Active   atorvastatin  (LIPITOR ) 80 MG tablet [id number] No Take 1 tablet (80 mg total) by mouth daily.  Patient not taking: Reported on 06/25/2023   Tobie Gaines, DO Unknown Active Self, Pharmacy Records, Child           Med Note ANGUS TINNIE Kitchens Jun 25, 2023  3:00 PM) Patient states they should be taking this and need a refill.  calcium  acetate (PHOSLO ) 667 MG capsule [id number] Yes Take 3 capsules (2,001 mg total)  by mouth with breakfast, with lunch, and with evening meal. Viviane Lemond BRAVO, PA-C Taking Active   carvedilol  (COREG ) 12.5 MG tablet [id number] Yes Take 1 tablet (12.5 mg total) by mouth 2 (two) times daily with a meal. Gold, Wayne E, PA-C Taking Active   clopidogrel  (PLAVIX ) 75 MG tablet [id number] Yes Take 1 tablet (75 mg total) by mouth daily. Gold, Wayne E, PA-C Taking Active   clopidogrel  (PLAVIX ) 75 MG tablet [id number] Yes Take 1 tablet (75 mg total) by mouth daily. Gold, Wayne E, PA-C Taking Active   famotidine  (PEPCID ) 20 MG tablet [id number] Yes Take 20 mg by mouth daily. [provider] Taking Active Self, Pharmacy Records, Child  ferric citrate  (AURYXIA ) 1 GM 210 MG(Fe) tablet [id number] Yes Take 2 tablets (420 mg total) by mouth daily.  Patient taking differently: Take 420 mg by mouth 3 (three) times daily with meals.   Tobie Gaines, DO Taking Active Self, Pharmacy Records, Child           Med Note ANGUS TINNIE Kitchens Jun 25, 2023  3:04 PM) Patient thinks they are only taking this at dialysis, but it has been consistently filled for 90d/s which indicate daily use at home as prescribed.  gabapentin  (NEURONTIN ) 100 MG capsule [id number] Yes Take 1 capsule (100 mg total) by mouth 2 (two) times daily as needed. Gold, Wayne E, PA-C Taking Active   multivitamin (RENA-VIT) TABS tablet [id number] Yes Take 1 tablet by mouth daily. [provider] Taking Active Self, Pharmacy Records, Child  olmesartan  (BENICAR ) 20 MG tablet [id number] Yes Take 1 tablet (20 mg total) by mouth daily. Tobie,  Libby, DO Taking Active Self, Pharmacy Records, Child  oxyCODONE  (OXY IR/ROXICODONE ) 5 MG immediate release tablet [id number] Yes Take 1 tablet (5 mg total) by mouth every 6 (six) hours as needed for severe pain (pain score 7-10). Gold, Wayne E, PA-C Taking Active   pantoprazole  (PROTONIX ) 20 MG tablet [id number] Yes Take 20 mg by mouth daily. [provider] Taking Active Self, Pharmacy Records,  Child  Med List Note (Card, Amy L, CPhT 04/02/23 1515): Dialysis Monday, Wednesday, and Friday.            Home Care and Equipment/Supplies: Were Home Health Services Ordered?: Yes Name of Home Health Agency:: [facility] Has Agency set up a time to come to your home?: Yes First Home Health Visit Date: 07/09/23 Any new equipment or medical supplies ordered?: Yes Name of Medical supply agency?: [facility] provided Home O2 and rolling walker Were you able to get the equipment/medical supplies?: Yes Do you have any questions related to the use of the equipment/supplies?: No  Functional Questionnaire: Do you need assistance with bathing/showering or dressing?: Yes (will be working with [facility] HHRN, PT/OT, aide to gain strength/reconditioning) Do you need assistance with meal preparation?: Yes Do you need assistance with eating?: No Do you have difficulty maintaining continence: No Do you need assistance with getting out of bed/getting out of a chair/moving?: Yes (will be working with [facility] HHRN, PT/OT, aide to gain strength/reconditioning) Do you have difficulty managing or taking your medications?: No  Follow up appointments reviewed: PCP Follow-up appointment confirmed?: No (patient states he will call for follow up appt with Dr. Lorren' office) MD Provider Line [phone] Given: No Specialist Hospital Follow-up appointment confirmed?: Yes Date of Specialist follow-up appointment?: 07/17/23 Follow-Up Specialty Provider:: Orren Fabry CABG fu Do you need transportation to your follow-up appointment?: No Do you understand care options if your condition(s) worsen?: Yes-patient verbalized understanding    Channing Larry, RN, BA, [facility], CRRN [facility] Population Health Care Management Coordinator, Transition of Care Ph # [phone]

## 2023-07-09 NOTE — Progress Notes (Signed)
 Late Note Entry- Jan. 13, 2025  Pt was d/c yesterday. Contacted [facility] this morning to be advised of pt's d/c date and that pt should resume care today.  Olivia Canter Renal Navigator [phone]

## 2023-07-09 NOTE — Telephone Encounter (Signed)
 Transition of care contact from inpatient facility  Date of Discharge: 07/08/23 Date of Contact:07/09/23 Method of contact: Phone  Attempted to contact patient to discuss transition of care from inpatient admission. Patient did not answer the phone. Will continue outreach attempts.

## 2023-07-12 ENCOUNTER — Telehealth (HOSPITAL_COMMUNITY): Payer: Self-pay

## 2023-07-12 NOTE — Telephone Encounter (Signed)
Pt insurance is active and benefits verified through Medicare A/B. Co-pay $0.00, DED $257.00/$18.34 met, out of pocket $0.00/$0.00 met, co-insurance 20%. No pre-authorization required. Passport, 07/12/23 @ 1:26PM, REF#[id number]   How many CR sessions are covered? (36 visits for TCR, 72 visits for ICR)72 Is this a lifetime maximum or an annual maximum? Annual Has the member used any of these services to date? No Is there a time limit (weeks/months) on start of program and/or program completion? No   2ndary insurance is active and benefits verified through [facility].[name] Co-pay $0.00, DED $5,000.00/$5,000.00 met, out of pocket $9,000.00/$9,000.00 met, co-insurance 50%. No pre-authorization required. Passport, 07/12/23 @ 1:29PM, REF#[id number]   Will contact patient to see if he is interested in the Cardiac Rehab Program. If interested, patient will need to complete follow up appt. Once completed, patient will be contacted for scheduling upon review by the RN Navigator.

## 2023-07-12 NOTE — Telephone Encounter (Signed)
Attempted to call patient in regards to Cardiac Rehab - LM on VM 

## 2023-07-16 ENCOUNTER — Other Ambulatory Visit: Payer: Self-pay

## 2023-07-16 ENCOUNTER — Telehealth: Payer: Self-pay

## 2023-07-16 NOTE — Patient Outreach (Signed)
  Care Management  Transitions of Care Program Managed Medicaid Transitions of Care week 2  07/16/2023 Name: Corey Park MRN: [id number] DOB: 1971/04/14  Subjective: Corey Park is a 53 y.o. year old male who is a primary care patient of [name], NP. The Care Management team was unable to reach the patient by phone to assess and address transitions of care needs and will reschedule appointment to Tuesday, 07/17/23 @ 4pm.   Plan: Additional outreach attempts will be made to reach the patient enrolled in the [facility] Program (Post Inpatient/ED Visit).  Alyse Low, RN, BA, [facility], CRRN [facility] Coordinator, Transition of Care Ph # [phone]

## 2023-07-16 NOTE — Patient Outreach (Signed)
  Care Management  Transitions of Care Program Managed Medicaid Transitions of Care week 2  07/16/2023 Name: Corey Park MRN: [id number] DOB: 1970/09/16  Subjective: Corey Park is a 53 y.o. year old male who is a primary care patient of [name], NP. The Care Management team was unable to reach the patient by phone to assess and address transitions of care needs.   The appointment is recheduled to 07/17/23 @4pm   Plan: Additional outreach attempts will be made to reach the patient enrolled in the [facility] Program (Post Inpatient/ED Visit).  Alyse Low, RN, BA, [facility], CRRN [facility] Coordinator, Transition of Care Ph # [phone]

## 2023-07-16 NOTE — Progress Notes (Deleted)
  Cardiology Office Note:  .   Date:  07/16/2023  ID:  [name], DOB 03-04-1971, MRN [id number] PCP: [name], NP  [facility] Providers Cardiologist:  Donato Schultz, MD {  History of Present Illness: .   Corey Park is a 53 y.o. male with a past medical history significant for ESRD on HD, chronic systolic heart failure, hypertensive cardiomyopathy, moderate secondary pulmonary hypertension, blindness, DM2, hypertension here for overdue follow-up appointment.  History includes systolic heart failure with EF 35% and COVID 19 hospitalization 08/03/2019.  June 2021 he had cellulitis of the perineum treated with antibiotics.  He works at [employer] making transition to [employer] easier reports compliance with dialysis Monday, Wednesday, and Friday goes at 4:30 PM.  Treated with Thorazine for hiccups.  He was last seen October 2022 he was not taking his Imdur.  Occasionally skips his Coreg.  Complaining of headaches.  Asking about supplements to help with energy including vitamin C, vitamin B for example.  He was encouraged at this time to take all prescribed medications.  Today, he ***  ROS: ***  Studies Reviewed: .        *** Risk Assessment/Calculations:   {Does this patient have ATRIAL FIBRILLATION?:[phone]} No BP recorded.  {Refresh Note OR Click here to enter BP  :1}***       Physical Exam:   VS:  There were no vitals taken for this visit.   Wt Readings from Last 3 Encounters:  07/07/23 215 lb (97.5 kg)  06/01/23 216 lb 0.8 oz (98 kg)  05/11/23 213 lb 13.5 oz (97 kg)    GEN: Well nourished, well developed in no acute distress NECK: No JVD; No carotid bruits CARDIAC: ***RRR, no murmurs, rubs, gallops RESPIRATORY:  Clear to auscultation without rales, wheezing or rhonchi  ABDOMEN: Soft, non-tender, non-distended EXTREMITIES:  No edema; No deformity   ASSESSMENT AND PLAN: .   Chronic systolic heart failure Acute renal failure {The  patient has an active order for outpatient cardiac rehabilitation.   Please indicate if the patient is ready to start. Do NOT delete this.  It will auto delete.  Refresh note, then sign.              Click here to document readiness and see contraindications.  :1}  Cardiac Rehabilitation Eligibility Assessment      {Are you ordering a CV Procedure (e.g. stress test, cath, DCCV, TEE, etc)?   Press F2        :[id number]}  Dispo: ***  Signed, Sharlene Dory, PA-C

## 2023-07-17 ENCOUNTER — Telehealth: Payer: Self-pay

## 2023-07-17 ENCOUNTER — Ambulatory Visit: Payer: Medicare Other | Attending: Physician Assistant | Admitting: Physician Assistant

## 2023-07-17 ENCOUNTER — Other Ambulatory Visit: Payer: Self-pay

## 2023-07-17 DIAGNOSIS — I5022 Chronic systolic (congestive) heart failure: Secondary | ICD-10-CM

## 2023-07-17 DIAGNOSIS — N186 End stage renal disease: Secondary | ICD-10-CM

## 2023-07-17 NOTE — Patient Outreach (Signed)
  Care ManagemeTransitions of Care Program Managed Medicaid Transitions of Care week 2  07/17/2023 Name: Corey Park MRN: [id number] DOB: [birth year]  Subjective: Corey Park is a 53 y.o. year old male who is a primary care patient of [name], NP. The Care Management team was unable to reach the patient by phone to assess and address transitions of care needs.   Plan: Additional outreach attempts will be made to reach the patient enrolled in the [facility] Program (Post Inpatient/ED Visit).  Alyse Low, RN, BA, [facility], CRRN [facility] Coordinator, Transition of Care Ph # [phone]

## 2023-07-17 NOTE — Patient Outreach (Signed)
  Care Management  Transitions of Care Program Managed Medicaid Transitions of Care week 2  07/17/2023 Name: Corey Park MRN: [id number] DOB: October 20, 1970  Subjective: Corey Park is a 53 y.o. year old male who is a primary care patient of [name], NP. The Care Management team was unable to reach the patient by phone to assess and address transitions of care needs.   Plan: Additional outreach attempts will be made to reach the patient enrolled in the [facility] Program (Post Inpatient/ED Visit).  Alyse Low, RN, BA, [facility], CRRN [facility] Coordinator, Transition of Care Ph # [phone]

## 2023-07-20 ENCOUNTER — Telehealth: Payer: Self-pay | Admitting: Thoracic Surgery (Cardiothoracic Vascular Surgery)

## 2023-07-23 ENCOUNTER — Telehealth: Payer: Self-pay

## 2023-07-23 ENCOUNTER — Other Ambulatory Visit: Payer: Self-pay

## 2023-07-23 NOTE — Patient Outreach (Signed)
  Care Management  Transitions of Care Program Managed Medicaid Transitions of Care week 3  07/23/2023 Name: Corey Park MRN: [id number] DOB: 09-07-1970  Subjective: Corey Park is a 53 y.o. year old male who is a primary care patient of [name], NP. The Care Management team was unable to reach the patient by phone to assess and address transitions of care needs.   Plan: Additional outreach attempts will be made to reach the patient enrolled in the [facility] Program (Post Inpatient/ED Visit).  Alyse Low, RN, BA, [facility], CRRN [facility] Coordinator, Transition of Care Ph # [phone]

## 2023-07-25 ENCOUNTER — Emergency Department (HOSPITAL_COMMUNITY): Payer: Medicare Other

## 2023-07-25 ENCOUNTER — Emergency Department (HOSPITAL_COMMUNITY)
Admission: EM | Admit: 2023-07-25 | Discharge: 2023-07-26 | Disposition: A | Discharge status: Home / Self Care | Payer: Medicare Other | Attending: Emergency Medicine | Admitting: Emergency Medicine

## 2023-07-25 ENCOUNTER — Encounter (HOSPITAL_COMMUNITY): Payer: Self-pay

## 2023-07-25 DIAGNOSIS — R0602 Shortness of breath: Secondary | ICD-10-CM | POA: Insufficient documentation

## 2023-07-25 DIAGNOSIS — Z7982 Long term (current) use of aspirin: Secondary | ICD-10-CM | POA: Insufficient documentation

## 2023-07-25 DIAGNOSIS — R072 Precordial pain: Secondary | ICD-10-CM | POA: Insufficient documentation

## 2023-07-25 DIAGNOSIS — Z7902 Long term (current) use of antithrombotics/antiplatelets: Secondary | ICD-10-CM | POA: Diagnosis not present

## 2023-07-25 DIAGNOSIS — R079 Chest pain, unspecified: Secondary | ICD-10-CM

## 2023-07-25 LAB — CBC
HCT: 26 % — ABNORMAL LOW (ref 39.0–52.0)
Hemoglobin: 8.3 g/dL — ABNORMAL LOW (ref 13.0–17.0)
MCH: 30.3 pg (ref 26.0–34.0)
MCHC: 31.9 g/dL (ref 30.0–36.0)
MCV: 94.9 fL (ref 80.0–100.0)
Platelets: 255 10*3/uL (ref 150–400)
RBC: 2.74 MIL/uL — ABNORMAL LOW (ref 4.22–5.81)
RDW: 16 % — ABNORMAL HIGH (ref 11.5–15.5)
WBC: 5.7 10*3/uL (ref 4.0–10.5)
nRBC: 0 % (ref 0.0–0.2)

## 2023-07-25 LAB — BASIC METABOLIC PANEL
Anion gap: 14 (ref 5–15)
BUN: 12 mg/dL (ref 6–20)
CO2: 30 mmol/L (ref 22–32)
Calcium: 8.1 mg/dL — ABNORMAL LOW (ref 8.9–10.3)
Chloride: 92 mmol/L — ABNORMAL LOW (ref 98–111)
Creatinine, Ser: 5.43 mg/dL — ABNORMAL HIGH (ref 0.61–1.24)
GFR, Estimated: 12 mL/min — ABNORMAL LOW (ref >60)
Glucose, Bld: 85 mg/dL (ref 70–99)
Potassium: 3.7 mmol/L (ref 3.5–5.1)
Sodium: 136 mmol/L (ref 135–145)

## 2023-07-25 LAB — TROPONIN I (HIGH SENSITIVITY)
Troponin I (High Sensitivity): 74 ng/L — ABNORMAL HIGH (ref <18)
Troponin I (High Sensitivity): 77 ng/L — ABNORMAL HIGH (ref <18)

## 2023-07-25 MED ORDER — OXYCODONE HCL 5 MG PO TABS
10.0000 mg | ORAL_TABLET | ORAL | Status: AC
  Administered 2023-07-26: 10 mg via ORAL
  Filled 2023-07-25: qty 2

## 2023-07-25 NOTE — ED Provider Notes (Signed)
[place] EMERGENCY DEPARTMENT AT [facility] Provider Note   CSN: [id number] Arrival date & time: 07/25/23  2033     History {Add pertinent medical, surgical, social history, OB history to HPI:1} Chief Complaint  Patient presents with   Chest Pain    Corey Park is a 53 y.o. male.  Patient presents to the emergency department for evaluation of chest pain.  Patient reports of pain in the center of his chest that radiates slightly to the right and into the back.  He reports that this feels similar to the pain he has been experiencing since he underwent triple bypass on January 2.  He does not take his oxycodone on the days that he has dialysis.  He did have a full dialysis session earlier today but started to feel poorly at the end and was sent to the ED.       Home Medications Prior to Admission medications   Medication Sig Start Date End Date Taking? Authorizing Provider  alum & mag hydroxide-simeth (MAALOX/MYLANTA) 200-200-20 MG/5ML suspension Take 30 mLs by mouth every 6 (six) hours as needed for indigestion or heartburn.    [provider]  aspirin EC 81 MG tablet Take 1 tablet (81 mg total) by mouth daily. Swallow whole. 07/08/23   Gold, Wayne E, PA-C  atorvastatin (LIPITOR) 80 MG tablet Take 1 tablet (80 mg total) by mouth daily. Patient not taking: Reported on 06/25/2023 12/15/22   Modena Slater, DO  calcium acetate (PHOSLO) 667 MG capsule Take 3 capsules (2,001 mg total) by mouth with breakfast, with lunch, and with evening meal. 07/08/23   Gold, Glenice Laine, PA-C  carvedilol (COREG) 12.5 MG tablet Take 1 tablet (12.5 mg total) by mouth 2 (two) times daily with a meal. 07/08/23   Gold, Glenice Laine, PA-C  clopidogrel (PLAVIX) 75 MG tablet Take 1 tablet (75 mg total) by mouth daily. 07/08/23   Rowe Clack, PA-C  clopidogrel (PLAVIX) 75 MG tablet Take 1 tablet (75 mg total) by mouth daily. 07/08/23   Gold, Wayne E, PA-C  famotidine (PEPCID) 20 MG tablet Take 20 mg by  mouth daily. 04/20/23   [provider]  ferric citrate (AURYXIA) 1 GM 210 MG(Fe) tablet Take 2 tablets (420 mg total) by mouth daily. Patient taking differently: Take 420 mg by mouth 3 (three) times daily with meals. 12/14/22   Modena Slater, DO  gabapentin (NEURONTIN) 100 MG capsule Take 1 capsule (100 mg total) by mouth 2 (two) times daily as needed. 07/08/23   Rowe Clack, PA-C  multivitamin (RENA-VIT) TABS tablet Take 1 tablet by mouth daily. 11/22/22   [provider]  olmesartan (BENICAR) 20 MG tablet Take 1 tablet (20 mg total) by mouth daily. 12/14/22   Modena Slater, DO  oxyCODONE (OXY IR/ROXICODONE) 5 MG immediate release tablet Take 1 tablet (5 mg total) by mouth every 6 (six) hours as needed for severe pain (pain score 7-10). 07/08/23   Gold, Wayne E, PA-C  pantoprazole (PROTONIX) 20 MG tablet Take 20 mg by mouth daily. 04/19/23   [provider]      Allergies    Imdur [isosorbide nitrate]    Review of Systems   Review of Systems  Physical Exam Updated Vital Signs BP (!) 183/81 (BP Location: Left Arm)   Pulse 75   Temp 99.5 F (37.5 C) (Oral)   Resp 16   SpO2 100%  Physical Exam Vitals and nursing note reviewed.  Constitutional:  General: He is not in acute distress.    Appearance: He is well-developed.  HENT:     Head: Normocephalic and atraumatic.     Mouth/Throat:     Mouth: Mucous membranes are moist.  Eyes:     General: Vision grossly intact. Gaze aligned appropriately.     Extraocular Movements: Extraocular movements intact.     Conjunctiva/sclera: Conjunctivae normal.  Cardiovascular:     Rate and Rhythm: Normal rate and regular rhythm.     Pulses: Normal pulses.     Heart sounds: Normal heart sounds, S1 normal and S2 normal. No murmur heard.    No friction rub. No gallop.  Pulmonary:     Effort: Pulmonary effort is normal. No respiratory distress.     Breath sounds: Normal breath sounds.  Abdominal:     Palpations: Abdomen is  soft.     Tenderness: There is no abdominal tenderness. There is no guarding or rebound.     Hernia: No hernia is present.  Musculoskeletal:        General: No swelling.     Cervical back: Full passive range of motion without pain, normal range of motion and neck supple. No pain with movement, spinous process tenderness or muscular tenderness. Normal range of motion.     Right lower leg: No edema.     Left lower leg: No edema.  Skin:    General: Skin is warm and dry.     Capillary Refill: Capillary refill takes less than 2 seconds.     Findings: No ecchymosis, erythema, lesion or wound.  Neurological:     Mental Status: He is alert and oriented to person, place, and time.     GCS: GCS eye subscore is 4. GCS verbal subscore is 5. GCS motor subscore is 6.     Cranial Nerves: Cranial nerves 2-12 are intact.     Sensory: Sensation is intact.     Motor: Motor function is intact. No weakness or abnormal muscle tone.     Coordination: Coordination is intact.  Psychiatric:        Mood and Affect: Mood normal.        Speech: Speech normal.        Behavior: Behavior normal.     ED Results / Procedures / Treatments   Labs (all labs ordered are listed, but only abnormal results are displayed) Labs Reviewed  BASIC METABOLIC PANEL - Abnormal; Notable for the following components:      Result Value   Chloride 92 (*)    Creatinine, Ser 5.43 (*)    Calcium 8.1 (*)    GFR, Estimated 12 (*)    All other components within normal limits  CBC - Abnormal; Notable for the following components:   RBC 2.74 (*)    Hemoglobin 8.3 (*)    HCT 26.0 (*)    RDW 16.0 (*)    All other components within normal limits  TROPONIN I (HIGH SENSITIVITY) - Abnormal; Notable for the following components:   Troponin I (High Sensitivity) 74 (*)    All other components within normal limits  TROPONIN I (HIGH SENSITIVITY) - Abnormal; Notable for the following components:   Troponin I (High Sensitivity) 77 (*)    All  other components within normal limits    EKG None  Radiology DG Chest 2 View Result Date: 07/25/2023 CLINICAL DATA:  Chest pain EXAM: CHEST - 2 VIEW COMPARISON:  Chest x-ray 07/05/2023 FINDINGS: Heart is enlarged, sternotomy wires are present.  There central pulmonary vascular congestion. There are patchy opacities in the left lung base. There is no pleural effusion or pneumothorax. No acute fractures are seen. IMPRESSION: 1. Cardiomegaly with central pulmonary vascular congestion. 2. Patchy opacities in the left lung base may represent atelectasis or infection. Electronically Signed   By: Darliss Cheney M.D.   On: 07/25/2023 22:19    Procedures Procedures  {Document cardiac monitor, telemetry assessment procedure when appropriate:1}  Medications Ordered in ED Medications - No data to display  ED Course/ Medical Decision Making/ A&P   {   Click here for ABCD2, HEART and other calculatorsREFRESH Note before signing :1}                              Medical Decision Making Amount and/or Complexity of Data Reviewed Labs: ordered. Radiology: ordered.   ***  {Document critical care time when appropriate:1} {Document review of labs and clinical decision tools ie heart score, Chads2Vasc2 etc:1}  {Document your independent review of radiology images, and any outside records:1} {Document your discussion with family members, caretakers, and with consultants:1} {Document social determinants of health affecting pt's care:1} {Document your decision making why or why not admission, treatments were needed:1} Final Clinical Impression(s) / ED Diagnoses Final diagnoses:  None    Rx / DC Orders ED Discharge Orders     None

## 2023-07-25 NOTE — Progress Notes (Deleted)
 Cardiology Office Note:  .   Date:  07/25/2023  ID:  Corey Park, DOB February 27, 1971, MRN [id number] PCP: [name], NP  [facility] Providers Cardiologist:  Donato Schultz, MD {  History of Present Illness: .   Corey Park is a 53 y.o. male with a past medical history significant for ESRD on HD, chronic systolic heart failure, hypertensive cardiomyopathy, moderate secondary pulmonary hypertension, blindness, DM2, hypertension here for overdue follow-up appointment.  History includes systolic heart failure with EF 35% and COVID 19 hospitalization 08/03/2019.  June 2021 he had cellulitis of the perineum treated with antibiotics.  He works at [employer] making transition to [employer] easier reports compliance with dialysis Monday, Wednesday, and Friday goes at 4:30 PM.  Treated with Thorazine for hiccups.  He was last seen October 2022 he was not taking his Imdur.  Occasionally skips his Coreg.  Complaining of headaches.  Asking about supplements to help with energy including vitamin C, vitamin B for example.  He was encouraged at this time to take all prescribed medications.  He presented to the ER back in December for chest pain.  And NSTEMI.  He had inferior lateral ST depressions with findings felt to be prominent compared to previous EKGs.  Also had a right bundle branch block and left anterior fascicular block with findings of LVH.  High-sensitivity troponins were markedly elevated greater than 10,000.  Chest x-ray showed findings felt to be consistent with a least a component of fluid overload and other concerning findings of bilateral patchy airspace opacities.  He noted that his dialysis schedule is different over the holidays.  BNP was elevated 620.  Repeat echocardiogram was done showing LVEF 40 to 45%.  RV normal function.  Valvular function was normal as well.  Underwent repeat cardiac catheterization which was found to have proximal RCA lesion of 80%, ostial  circumflex to mid circumflex lesion of 99%, and proximal LAD lesion with a 95% stenosis of the mid LAD and 50% stenosis.  Mid left main to ostial LAD with 50%.  Placed on heparin drip and due to the multivessel CAD, CT surgery was consulted.  On 06/28/2023 he was taken to the operating room and underwent CABG x 3.  He was given 1 pack of RBCs inpatient rehab was recommended ultimately patient decided to go home with his son and mother who will help him.  Started on Lopressor and was changed to Coreg.  Started on losartan for better blood pressure control.  Today, he ***  ROS: Pertinent ROS in HPI  Studies Reviewed: [name]        Left Main  Mid LM to Ost LAD lesion is 50% stenosed.    Left Anterior Descending  Prox LAD lesion is 95% stenosed. The lesion is severely calcified.  Mid LAD lesion is 50% stenosed.    Left Circumflex  Vessel is large. Vessel is angiographically normal.  Ost Cx to Mid Cx lesion is 99% stenosed.    Right Coronary Artery  Vessel is small.  Prox RCA lesion is 80% stenosed.    Intervention   No interventions have been documented.   Coronary Diagrams  Diagnostic Dominance: Co-dominant  Intervention Risk Assessment/Calculations:   {Does this patient have ATRIAL FIBRILLATION?[phone]} No BP recorded.  {Refresh Note OR Click here to enter BP  :1}***       Physical Exam:   VS:  There were no vitals taken for this visit.   Wt Readings from Last 3  Encounters:  07/07/23 215 lb (97.5 kg)  06/01/23 216 lb 0.8 oz (98 kg)  05/11/23 213 lb 13.5 oz (97 kg)    GEN: Well nourished, well developed in no acute distress NECK: No JVD; No carotid bruits CARDIAC: ***RRR, no murmurs, rubs, gallops RESPIRATORY:  Clear to auscultation without rales, wheezing or rhonchi  ABDOMEN: Soft, non-tender, non-distended EXTREMITIES:  No edema; No deformity   ASSESSMENT AND PLAN: .   Chronic systolic heart failure Acute renal failure CAD s/p CABG x 3    {The patient has an  active order for outpatient cardiac rehabilitation.   Please indicate if the patient is ready to start. Do NOT delete this.  It will auto delete.  Refresh note, then sign.              Click here to document readiness and see contraindications.  :1}  Cardiac Rehabilitation Eligibility Assessment      {Are you ordering a CV Procedure (e.g. stress test, cath, DCCV, TEE, etc)?   Press F2        :[id number]}  Dispo: ***  Signed, Sharlene Dory, PA-C

## 2023-07-25 NOTE — ED Provider Triage Note (Signed)
Emergency Medicine Provider Triage Evaluation Note  Teddy Rebstock , a 53 y.o. male  was evaluated in triage.  Pt complains of midsternal chest pain.  Patient reports recent triple bypass on January 2 with Dr. Cliffton Asters.  States he has been taking anticoagulation other than the day that he dialyzes.  Reports he was at dialysis on Monday Wednesday Friday.  Received full treatment today but halfway through the treatment developed chest pain rated 10 out of 10 going to his back.  Endorsing shortness of breath, lightheadedness, dizziness, weakness.  Denies nausea vomiting abdominal pain, leg swelling.  Review of Systems  Positive:  Negative:   Physical Exam  There were no vitals taken for this visit. Gen:   Awake, no distress   Resp:  Normal effort  MSK:   Moves extremities without difficulty  Other:    Medical Decision Making  Medically screening exam initiated at 8:51 PM.  Appropriate orders placed.  Nagee Peppard was informed that the remainder of the evaluation will be completed by another provider, this initial triage assessment does not replace that evaluation, and the importance of remaining in the ED until their evaluation is complete.     Al Decant, PA-C 07/25/23 2052

## 2023-07-25 NOTE — ED Triage Notes (Signed)
Pt was at dialysis today and got his full treatment, he mentions after the treatment he started having mid sternal chest pain that radiates to his back as well. There was no reported problems with his dialysis   Medic vitals   190/90 70hr 16rr 96%ra  18g left AC 324mg  asa given  X1 nitroglycerin given with no pain relief

## 2023-07-26 ENCOUNTER — Ambulatory Visit: Payer: Medicare Other | Attending: Physician Assistant | Admitting: Physician Assistant

## 2023-07-26 ENCOUNTER — Telehealth: Payer: Self-pay

## 2023-07-26 ENCOUNTER — Emergency Department (HOSPITAL_COMMUNITY): Payer: Medicare Other

## 2023-07-26 DIAGNOSIS — Z951 Presence of aortocoronary bypass graft: Secondary | ICD-10-CM

## 2023-07-26 DIAGNOSIS — N179 Acute kidney failure, unspecified: Secondary | ICD-10-CM

## 2023-07-26 DIAGNOSIS — R079 Chest pain, unspecified: Secondary | ICD-10-CM

## 2023-07-26 DIAGNOSIS — R072 Precordial pain: Secondary | ICD-10-CM | POA: Diagnosis not present

## 2023-07-26 DIAGNOSIS — I2510 Atherosclerotic heart disease of native coronary artery without angina pectoris: Secondary | ICD-10-CM

## 2023-07-26 LAB — TROPONIN I (HIGH SENSITIVITY): Troponin I (High Sensitivity): 82 ng/L — ABNORMAL HIGH (ref <18)

## 2023-07-26 LAB — BRAIN NATRIURETIC PEPTIDE: B Natriuretic Peptide: 1545.2 pg/mL — ABNORMAL HIGH (ref 0.0–100.0)

## 2023-07-26 MED ORDER — IOHEXOL 350 MG/ML SOLN
75.0000 mL | Freq: Once | INTRAVENOUS | Status: AC | PRN
  Administered 2023-07-26: 75 mL via INTRAVENOUS

## 2023-07-26 NOTE — Telephone Encounter (Signed)
Caitlyn, PT assistant with [facility] contacted the office requesting verbal approval for [facility] social work services for patient. He is s/p CABG with Dr. Cliffton Asters 06/28/23. VO given via secure voicemail for social work to help with medicaid, transport, and caregiver services.

## 2023-07-26 NOTE — ED Notes (Signed)
Pt is discharged and discharge instructions were reviewed and pt verbalizes understanding. Pt expresses 10/10 chest pressure. Pt is being transported to home address by [facility].

## 2023-07-26 NOTE — ED Notes (Signed)
[facility] notified.  ?

## 2023-07-27 ENCOUNTER — Encounter: Payer: Self-pay | Admitting: Physician Assistant

## 2023-07-30 ENCOUNTER — Other Ambulatory Visit: Payer: Self-pay

## 2023-07-30 ENCOUNTER — Telehealth: Payer: Self-pay

## 2023-07-30 NOTE — Patient Outreach (Signed)
  Care Management  Transitions of Care Program Transitions of Care Post-discharge week 4  07/30/2023 Name: Corey Park MRN: [id number] DOB: 08-Aug-1970  Subjective: Corey Park is a 53 y.o. year old male who is a primary care patient of [name], NP. The Care Management team was unable to reach the patient by phone to assess and address transitions of care needs.   Plan: Additional outreach attempts will be made to reach the patient enrolled in the [facility] Program (Post Inpatient/ED Visit).  Alyse Low, RN, BA, [facility], CRRN [facility] Coordinator, Transition of Care Ph # [phone]

## 2023-08-03 ENCOUNTER — Telehealth: Payer: Self-pay

## 2023-08-03 NOTE — Telephone Encounter (Signed)
 PT Asst with [facility], Caitlyn contacted the office with questions about patient's BP medications. She states that he is taking Carvedilol  but is not taking Olmesartan  which is on his medication list. Advised that he still has medication listed on his discharge summary and he should still be taking it. She acknowledged receipt.   Also states that patient's blood pressure was 190 systolic but patient has not eaten or taken his medications today. While she was there, he took his medications and rechecked his blood pressure and it has started to lower. Advised patient needs to follow-up with Cardiology for medication adjustments if needed and blood pressure control. He did have appt scheduled but did not make it to the appt. Phone number and address were given to HHPT for patient to contact Cardiology for appt for f/u.

## 2023-08-08 NOTE — Progress Notes (Addendum)
 [address]       [place] [address]             [phone]   HPI: This is a 53 year old male who is s/p CABG X 3 (LIMA LAD, RSVG OM, and RSVG to distal RCA) with endoscopic greater saphenous vein harvest on the right by Dr. Cliffton Asters on 06/28/2023. He had a NSTEMI on admission so he was put on Plavix. He was discharged on 07/08/2023. Of note, he presented to [facility] ED On 3 separate occasions. First, on 07/25/2023 for evaluation of chest pain. Thorough work up was done including CTA showed no PE but a a small amount of low-density fluid in the anterior mediastinum (either a hematoma or a seroma). After discussion  with Dr. Brayton Layman, on-call for cardiology, it was felt patient stable for discharge and continuing dual antiplatelet therapy with follow up with cardiology. Second, he was admitted from 08/10/2023 through 08/13/2023 and was treated for auditory and visual hallucinations as well as left eye pain. 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 He presented to [facility] ED again after HD on  08/17/2023 with complaints of chest pain again and left eye pain. He was treated for hypertension and was to continue Polytrim for his eye (Fluorescein stain was applied and showed no uptake). He again was discharged and importance of taking BP medications was addressed. He presents today for in person post op follow up. He denies further chest pain and states his left eye pain has resolved. He does note he does not have much appetite (denies abdominal pain or nausea).   Current Outpatient Medications  Medication Sig Dispense Refill   alum & mag hydroxide-simeth (MAALOX/MYLANTA) 200-200-20 MG/5ML suspension Take 30 mLs by mouth every 6 (six) hours as needed for indigestion or heartburn.     aspirin EC 81 MG tablet Take 1 tablet (81 mg total) by mouth daily. Swallow whole.      atorvastatin (LIPITOR) 80 MG tablet Take 1 tablet (80 mg total) by mouth daily. (Patient not taking: Reported on 06/25/2023) 30 tablet 0   calcium acetate (PHOSLO) 667 MG capsule Take 3 capsules (2,001 mg total) by mouth with breakfast, with lunch, and with evening meal. 270 capsule 1   carvedilol (COREG) 12.5 MG tablet Take 1 tablet (12.5 mg total) by mouth 2 (two) times daily with a meal. 60 tablet 1   clopidogrel (PLAVIX) 75 MG tablet Take 1 tablet (75 mg total) by mouth daily. 30 tablet 1   clopidogrel (PLAVIX) 75 MG tablet Take 1 tablet (75 mg total) by mouth daily. 30 tablet 1   famotidine (PEPCID) 20 MG tablet Take 20 mg by mouth daily.     ferric citrate (AURYXIA) 1 GM 210 MG(Fe) tablet Take 2 tablets (420 mg total) by mouth daily. (Patient taking differently: Take 420 mg by mouth 3 (three) times daily with meals.) 60 tablet 0   multivitamin (RENA-VIT) TABS tablet Take 1 tablet by mouth daily.     olmesartan (BENICAR) 20 MG tablet Take 1 tablet (20 mg total) by mouth daily. 30 tablet 0   pantoprazole (PROTONIX) 20 MG tablet Take 20 mg by mouth daily.    Vital Signs: Vitals:   08/22/23 1250  BP: (!) 173/92  Pulse: 77  Resp: 18  SpO2: 96%     Physical Exam: CV-RRR, no murmur  Pulmonary-Clear to auscultation bilaterally Abdomen-Soft, non tender, bowel sounds present Extremities-No LE edema Wounds-Sternal and RLE wounds are clean, dry, well healed, no sign of infection  Diagnostic Tests:  Narrative & Impression  CLINICAL DATA:  Right-sided chest pain   EXAM: PORTABLE CHEST 1 VIEW   COMPARISON:  Chest x-ray 08/10/2023   FINDINGS: The heart is enlarged. There is no focal lung infiltrate, pleural effusion or pneumothorax. Sternotomy wires are present. No acute fractures are seen.   IMPRESSION: Cardiomegaly. No active disease.     Electronically Signed   By: Darliss Cheney M.D.   On: 08/17/2023 17:28    Impression and Plan: He is hypertensive. Patient states he is  compliant with his medications. He states he has been taking Coreg 25 mg bid, not 12.5 mg bid?. He has an appointment to see family medicine NP in am but in the meantime, will increase Benicar to help control BP better. He may need a third medication. We discussed the importance of strict BP control (to avoid stroke etc). We discussed sternal precautions (no lifting more than 10 pounds until 09/05/2023. We discussed about participation in cardiac rehab, but with HD three days per week and being blind, patient will not participate in rehab. Finally, we discussed about the importance of seeing PCP over the next few months for further surveillance of pre op HGA1C 6.1 (pre diabetes) and discussion of unintentional weight loss. He will be given nutrition information to help with pre diabetes. He will return to [facility]. It appears he missed his cardiology appointment as he was in the ED. He was instructed to make a follow up with cardiology as they will be following him indefinitely. He will return to [facility].  Ardelle Balls, PA-C [facility] [phone]

## 2023-08-10 ENCOUNTER — Inpatient Hospital Stay (HOSPITAL_COMMUNITY)
Admission: EM | Admit: 2023-08-10 | Discharge: 2023-08-13 | DRG: 091 | Disposition: A | Discharge status: Home Health | Payer: Medicare Other | Source: Ambulatory Visit | Attending: Family Medicine | Admitting: Family Medicine

## 2023-08-10 ENCOUNTER — Other Ambulatory Visit: Payer: Self-pay

## 2023-08-10 ENCOUNTER — Emergency Department (HOSPITAL_COMMUNITY): Payer: Medicare Other

## 2023-08-10 ENCOUNTER — Encounter (HOSPITAL_COMMUNITY): Payer: Self-pay | Admitting: Emergency Medicine

## 2023-08-10 DIAGNOSIS — Z951 Presence of aortocoronary bypass graft: Secondary | ICD-10-CM

## 2023-08-10 DIAGNOSIS — I5022 Chronic systolic (congestive) heart failure: Secondary | ICD-10-CM | POA: Diagnosis present

## 2023-08-10 DIAGNOSIS — R443 Hallucinations, unspecified: Secondary | ICD-10-CM | POA: Diagnosis not present

## 2023-08-10 DIAGNOSIS — Z8249 Family history of ischemic heart disease and other diseases of the circulatory system: Secondary | ICD-10-CM

## 2023-08-10 DIAGNOSIS — J45909 Unspecified asthma, uncomplicated: Secondary | ICD-10-CM | POA: Diagnosis present

## 2023-08-10 DIAGNOSIS — Z8616 Personal history of COVID-19: Secondary | ICD-10-CM

## 2023-08-10 DIAGNOSIS — G928 Other toxic encephalopathy: Principal | ICD-10-CM | POA: Diagnosis present

## 2023-08-10 DIAGNOSIS — E1122 Type 2 diabetes mellitus with diabetic chronic kidney disease: Secondary | ICD-10-CM | POA: Diagnosis present

## 2023-08-10 DIAGNOSIS — Z992 Dependence on renal dialysis: Secondary | ICD-10-CM

## 2023-08-10 DIAGNOSIS — Z885 Allergy status to narcotic agent status: Secondary | ICD-10-CM

## 2023-08-10 DIAGNOSIS — I252 Old myocardial infarction: Secondary | ICD-10-CM

## 2023-08-10 DIAGNOSIS — N2581 Secondary hyperparathyroidism of renal origin: Secondary | ICD-10-CM | POA: Diagnosis present

## 2023-08-10 DIAGNOSIS — Z8701 Personal history of pneumonia (recurrent): Secondary | ICD-10-CM

## 2023-08-10 DIAGNOSIS — I452 Bifascicular block: Secondary | ICD-10-CM | POA: Diagnosis present

## 2023-08-10 DIAGNOSIS — E1151 Type 2 diabetes mellitus with diabetic peripheral angiopathy without gangrene: Secondary | ICD-10-CM | POA: Diagnosis present

## 2023-08-10 DIAGNOSIS — Z7902 Long term (current) use of antithrombotics/antiplatelets: Secondary | ICD-10-CM

## 2023-08-10 DIAGNOSIS — E785 Hyperlipidemia, unspecified: Secondary | ICD-10-CM | POA: Diagnosis present

## 2023-08-10 DIAGNOSIS — Z888 Allergy status to other drugs, medicaments and biological substances status: Secondary | ICD-10-CM

## 2023-08-10 DIAGNOSIS — G9340 Encephalopathy, unspecified: Secondary | ICD-10-CM | POA: Diagnosis present

## 2023-08-10 DIAGNOSIS — H548 Legal blindness, as defined in USA: Secondary | ICD-10-CM | POA: Diagnosis present

## 2023-08-10 DIAGNOSIS — I132 Hypertensive heart and chronic kidney disease with heart failure and with stage 5 chronic kidney disease, or end stage renal disease: Secondary | ICD-10-CM | POA: Diagnosis present

## 2023-08-10 DIAGNOSIS — D631 Anemia in chronic kidney disease: Secondary | ICD-10-CM | POA: Diagnosis present

## 2023-08-10 DIAGNOSIS — I2510 Atherosclerotic heart disease of native coronary artery without angina pectoris: Secondary | ICD-10-CM | POA: Diagnosis present

## 2023-08-10 DIAGNOSIS — I420 Dilated cardiomyopathy: Secondary | ICD-10-CM | POA: Diagnosis present

## 2023-08-10 DIAGNOSIS — H109 Unspecified conjunctivitis: Secondary | ICD-10-CM

## 2023-08-10 DIAGNOSIS — T40605A Adverse effect of unspecified narcotics, initial encounter: Secondary | ICD-10-CM | POA: Diagnosis present

## 2023-08-10 DIAGNOSIS — Z79899 Other long term (current) drug therapy: Secondary | ICD-10-CM

## 2023-08-10 DIAGNOSIS — E876 Hypokalemia: Secondary | ICD-10-CM | POA: Diagnosis present

## 2023-08-10 DIAGNOSIS — T426X5A Adverse effect of other antiepileptic and sedative-hypnotic drugs, initial encounter: Secondary | ICD-10-CM | POA: Diagnosis present

## 2023-08-10 DIAGNOSIS — N186 End stage renal disease: Secondary | ICD-10-CM

## 2023-08-10 DIAGNOSIS — R7989 Other specified abnormal findings of blood chemistry: Secondary | ICD-10-CM

## 2023-08-10 DIAGNOSIS — Z7982 Long term (current) use of aspirin: Secondary | ICD-10-CM

## 2023-08-10 DIAGNOSIS — J9811 Atelectasis: Secondary | ICD-10-CM | POA: Diagnosis present

## 2023-08-10 DIAGNOSIS — Z5329 Procedure and treatment not carried out because of patient's decision for other reasons: Secondary | ICD-10-CM | POA: Diagnosis present

## 2023-08-10 LAB — COMPREHENSIVE METABOLIC PANEL
ALT: 10 U/L (ref 0–44)
AST: 22 U/L (ref 15–41)
Albumin: 3.5 g/dL (ref 3.5–5.0)
Alkaline Phosphatase: 51 U/L (ref 38–126)
Anion gap: 17 — ABNORMAL HIGH (ref 5–15)
BUN: 27 mg/dL — ABNORMAL HIGH (ref 6–20)
CO2: 27 mmol/L (ref 22–32)
Calcium: 9 mg/dL (ref 8.9–10.3)
Chloride: 94 mmol/L — ABNORMAL LOW (ref 98–111)
Creatinine, Ser: 9.23 mg/dL — ABNORMAL HIGH (ref 0.61–1.24)
GFR, Estimated: 6 mL/min — ABNORMAL LOW (ref >60)
Glucose, Bld: 105 mg/dL — ABNORMAL HIGH (ref 70–99)
Potassium: 3.4 mmol/L — ABNORMAL LOW (ref 3.5–5.1)
Sodium: 138 mmol/L (ref 135–145)
Total Bilirubin: 0.9 mg/dL (ref 0.0–1.2)
Total Protein: 8.4 g/dL — ABNORMAL HIGH (ref 6.5–8.1)

## 2023-08-10 LAB — CBC WITH DIFFERENTIAL/PLATELET
Abs Immature Granulocytes: 0.01 10*3/uL (ref 0.00–0.07)
Basophils Absolute: 0 10*3/uL (ref 0.0–0.1)
Basophils Relative: 0 %
Eosinophils Absolute: 0.2 10*3/uL (ref 0.0–0.5)
Eosinophils Relative: 3 %
HCT: 28.1 % — ABNORMAL LOW (ref 39.0–52.0)
Hemoglobin: 8.9 g/dL — ABNORMAL LOW (ref 13.0–17.0)
Immature Granulocytes: 0 %
Lymphocytes Relative: 16 %
Lymphs Abs: 1.1 10*3/uL (ref 0.7–4.0)
MCH: 30 pg (ref 26.0–34.0)
MCHC: 31.7 g/dL (ref 30.0–36.0)
MCV: 94.6 fL (ref 80.0–100.0)
Monocytes Absolute: 0.7 10*3/uL (ref 0.1–1.0)
Monocytes Relative: 10 %
Neutro Abs: 4.9 10*3/uL (ref 1.7–7.7)
Neutrophils Relative %: 71 %
Platelets: 222 10*3/uL (ref 150–400)
RBC: 2.97 MIL/uL — ABNORMAL LOW (ref 4.22–5.81)
RDW: 17.8 % — ABNORMAL HIGH (ref 11.5–15.5)
WBC: 6.9 10*3/uL (ref 4.0–10.5)
nRBC: 0 % (ref 0.0–0.2)

## 2023-08-10 LAB — TROPONIN I (HIGH SENSITIVITY)
Troponin I (High Sensitivity): 106 ng/L (ref <18)
Troponin I (High Sensitivity): 110 ng/L (ref <18)

## 2023-08-10 LAB — SALICYLATE LEVEL: Salicylate Lvl: <7 mg/dL — ABNORMAL LOW (ref 7.0–30.0)

## 2023-08-10 LAB — ACETAMINOPHEN LEVEL: Acetaminophen (Tylenol), Serum: <10 ug/mL — ABNORMAL LOW (ref 10–30)

## 2023-08-10 LAB — ETHANOL: Alcohol, Ethyl (B): <10 mg/dL (ref <10)

## 2023-08-10 LAB — LIPASE, BLOOD: Lipase: 37 U/L (ref 11–51)

## 2023-08-10 MED ORDER — LORAZEPAM 2 MG/ML IJ SOLN
1.0000 mg | Freq: Once | INTRAMUSCULAR | Status: AC
  Administered 2023-08-10: 1 mg via INTRAVENOUS
  Filled 2023-08-10: qty 1

## 2023-08-10 NOTE — ED Provider Notes (Addendum)
[place] EMERGENCY DEPARTMENT AT [facility] Provider Note   CSN: [id number] Arrival date & time: 08/10/23  1951     History  Chief Complaint  Patient presents with   Hallucinations    Corey Park is a 53 y.o. male.  Patient sent in from dialysis.  Patient supposedly was having auditory and visual hallucinations but I think he does not see very well while receiving treatment.  Patient's apparently missed his previous dialysis point he did not finish full treatment today became agitated on arrival repeating multiple phrases and swatting at the hallucinations.  Patient able to redirect after multiple attempts.  Patient sent in here.  Chart review shows the patient is never had a history of psychosis best we can tell.  Patient did have a non-STEMI in December and early January had CABG.  Patient apparently's been on the dialysis for a period of time but patient's history is a little bit unreliable.  And patient underwent coronary artery bypass graft on January 2.  Patient never used tobacco products.       Home Medications Prior to Admission medications   Medication Sig Start Date End Date Taking? Authorizing Provider  alum & mag hydroxide-simeth (MAALOX/MYLANTA) 200-200-20 MG/5ML suspension Take 30 mLs by mouth every 6 (six) hours as needed for indigestion or heartburn.    [provider]  aspirin EC 81 MG tablet Take 1 tablet (81 mg total) by mouth daily. Swallow whole. 07/08/23   Gold, Wayne E, PA-C  atorvastatin (LIPITOR) 80 MG tablet Take 1 tablet (80 mg total) by mouth daily. Patient not taking: Reported on 06/25/2023 12/15/22   Modena Slater, DO  calcium acetate (PHOSLO) 667 MG capsule Take 3 capsules (2,001 mg total) by mouth with breakfast, with lunch, and with evening meal. 07/08/23   Gold, Glenice Laine, PA-C  carvedilol (COREG) 12.5 MG tablet Take 1 tablet (12.5 mg total) by mouth 2 (two) times daily with a meal. 07/08/23   Gold, Glenice Laine, PA-C  clopidogrel  (PLAVIX) 75 MG tablet Take 1 tablet (75 mg total) by mouth daily. 07/08/23   Rowe Clack, PA-C  clopidogrel (PLAVIX) 75 MG tablet Take 1 tablet (75 mg total) by mouth daily. 07/08/23   Gold, Wayne E, PA-C  famotidine (PEPCID) 20 MG tablet Take 20 mg by mouth daily. 04/20/23   [provider]  ferric citrate (AURYXIA) 1 GM 210 MG(Fe) tablet Take 2 tablets (420 mg total) by mouth daily. Patient taking differently: Take 420 mg by mouth 3 (three) times daily with meals. 12/14/22   Modena Slater, DO  gabapentin (NEURONTIN) 100 MG capsule Take 1 capsule (100 mg total) by mouth 2 (two) times daily as needed. 07/08/23   Rowe Clack, PA-C  multivitamin (RENA-VIT) TABS tablet Take 1 tablet by mouth daily. 11/22/22   [provider]  olmesartan (BENICAR) 20 MG tablet Take 1 tablet (20 mg total) by mouth daily. 12/14/22   Modena Slater, DO  oxyCODONE (OXY IR/ROXICODONE) 5 MG immediate release tablet Take 1 tablet (5 mg total) by mouth every 6 (six) hours as needed for severe pain (pain score 7-10). 07/08/23   Gold, Wayne E, PA-C  pantoprazole (PROTONIX) 20 MG tablet Take 20 mg by mouth daily. 04/19/23   [provider]      Allergies    Imdur [isosorbide nitrate]    Review of Systems   Review of Systems  Unable to perform ROS: Mental status change    Physical Exam Updated Vital  Signs BP (!) 177/94   Pulse 88   Temp 99.4 F (37.4 C) (Oral)   Resp 17   Ht 1.854 m (6\' 1" )   Wt 97.5 kg   SpO2 99%   BMI 28.37 kg/m  Physical Exam Vitals and nursing note reviewed.  Constitutional:      General: He is not in acute distress.    Appearance: Normal appearance. He is well-developed.  HENT:     Head: Normocephalic and atraumatic.  Eyes:     Conjunctiva/sclera: Conjunctivae normal.  Cardiovascular:     Rate and Rhythm: Normal rate and regular rhythm.     Heart sounds: No murmur heard. Pulmonary:     Effort: Pulmonary effort is normal. No respiratory distress.     Breath  sounds: Normal breath sounds.  Abdominal:     Palpations: Abdomen is soft.     Tenderness: There is no abdominal tenderness.  Musculoskeletal:        General: No swelling.     Cervical back: Neck supple.     Comments: AV fistula right upper extremity with good thrill  Skin:    General: Skin is warm and dry.     Capillary Refill: Capillary refill takes less than 2 seconds.  Neurological:     Mental Status: He is alert.     Comments: Patient awake and is redirected.  Moving all extremities.  Psychiatric:        Mood and Affect: Mood normal.     ED Results / Procedures / Treatments   Labs (all labs ordered are listed, but only abnormal results are displayed) Labs Reviewed  CBC WITH DIFFERENTIAL/PLATELET - Abnormal; Notable for the following components:      Result Value   RBC 2.97 (*)    Hemoglobin 8.9 (*)    HCT 28.1 (*)    RDW 17.8 (*)    All other components within normal limits  COMPREHENSIVE METABOLIC PANEL - Abnormal; Notable for the following components:   Potassium 3.4 (*)    Chloride 94 (*)    Glucose, Bld 105 (*)    BUN 27 (*)    Creatinine, Ser 9.23 (*)    Total Protein 8.4 (*)    GFR, Estimated 6 (*)    Anion gap 17 (*)    All other components within normal limits  ACETAMINOPHEN LEVEL - Abnormal; Notable for the following components:   Acetaminophen (Tylenol), Serum <10 (*)    All other components within normal limits  SALICYLATE LEVEL - Abnormal; Notable for the following components:   Salicylate Lvl <7.0 (*)    All other components within normal limits  TROPONIN I (HIGH SENSITIVITY) - Abnormal; Notable for the following components:   Troponin I (High Sensitivity) 106 (*)    All other components within normal limits  LIPASE, BLOOD  ETHANOL  TROPONIN I (HIGH SENSITIVITY)    EKG EKG Interpretation Date/Time:  Friday August 10 2023 21:05:48 EST Ventricular Rate:  86 PR Interval:  168 QRS Duration:  118 QT Interval:  432 QTC Calculation: 517 R  Axis:   177  Text Interpretation: Ectopic atrial rhythm Nonspecific intraventricular conduction delay Posterior infarct, acute (LCx) Anterolateral infarct, age indeterminate Confirmed by Vanetta Mulders [phone]) on 08/10/2023 9:38:48 PM  Radiology DG Chest Port 1 View Result Date: 08/10/2023 CLINICAL DATA:  Altered mental status. EXAM: PORTABLE CHEST 1 VIEW COMPARISON:  July 25, 2023 FINDINGS: Multiple sternal wires and vascular clips are seen. The cardiac silhouette is mildly enlarged and unchanged  in size. Low lung volumes are noted. Mild, stable areas of atelectasis and/or infiltrate are seen within the bilateral lung bases. No pleural effusion or pneumothorax is identified. No acute osseous abnormalities are identified. IMPRESSION: 1. Evidence of prior median sternotomy/CABG. 2. Low lung volumes with mild bibasilar atelectasis and/or infiltrate. Electronically Signed   By: Aram Candela M.D.   On: 08/10/2023 21:36    Procedures Procedures    Medications Ordered in ED Medications  LORazepam (ATIVAN) injection 1 mg (has no administration in time range)    ED Course/ Medical Decision Making/ A&P                                 Medical Decision Making Amount and/or Complexity of Data Reviewed Labs: ordered. Radiology: ordered.  Risk Prescription drug management.   Mental status change patient seems to be having psychosis but no history of that.  Aced on this we will get CT head chest x-ray labs.  Patient also did not complete dialysis.  Based on the fact that patient did not complete dialysis today and he missed his previous dialysis may require acute dialysis maybe  Patient's EKG raises concerns for an acute posterior infarct.  It is not changed from the end of January.  Patient denies any chest pain.  Still seems to be having some confusion.  Initial troponin not officially reported but they called and that it was 106.  Will discuss with the on-call  cardiologist.  CRITICAL CARE Performed by: Vanetta Mulders Total critical care time: 45 minutes Critical care time was exclusive of separately billable procedures and treating other patients. Critical care was necessary to treat or prevent imminent or life-threatening deterioration. Critical care was time spent personally by me on the following activities: development of treatment plan with patient and/or surrogate as well as nursing, discussions with consultants, evaluation of patient's response to treatment, examination of patient, obtaining history from patient or surrogate, ordering and performing treatments and interventions, ordering and review of laboratory studies, ordering and review of radiographic studies, pulse oximetry and re-evaluation of patient's condition.  I discussed with on-call cardiology.  They do not think it meets STEMI criteria.  We will check on troponins with him.  Do not have the troponin officially read out yet but they said it was 106.  Patient's delta troponins pending.  Other labs CBC white count 6.9 hemoglobin 8.9 platelets of 222 complete metabolic panel potassium actually on the low side 3.4 GFR 6 which goes along with him being a dialysis patient LFTs are normal.  Did not have any hyperkalemia anion gap 17 lipase 37 alcohol less than 10 Tylenol less than 10 salicylate less than 10 initial troponin is mention was 106.  Portable chest x-ray evidence of prior median sternotomy to CABG low lung volumes with mild bibasilar atelectasis or and/or infiltrate but does not seem to be any significant pleural effusion or pneumothorax.  Not sure whether there could be evidence of pneumonia.  Since his white blood cell count though is normal.  He is not really showing any evidence of a respiratory abnormality.  Do need head CT will give some Ativan to see if we can get it through it.  We tried it without and he would not sit still so they had to bring him back.  Patient does seem  to be more cooperative.  Patient states he normally gets dialysis Monday Wednesdays and Fridays which kind  of explains today.  It does appear that they got pretty significant dialysis done and he is not in any acute abnormalities and may very well not need dialysis again until Monday.  But were still left with the mental status change.  Patient is essentially blind but he can see a little bit.  Chart review shows that he really has not had any activity or behavior like this.  Dan did discuss the EKG with cardiology and they felt it was not consistent with acute MI.  From a STEMI standpoint.  But obviously delta troponin will be significant.   Final Clinical Impression(s) / ED Diagnoses Final diagnoses:  Hallucinations  End stage renal disease on dialysis [facility])  Elevated troponin    Rx / DC Orders ED Discharge Orders     None         Vanetta Mulders, MD 08/10/23 2051    Vanetta Mulders, MD 08/10/23 1610    Vanetta Mulders, MD 08/10/23 2154    Vanetta Mulders, MD 08/10/23 2341

## 2023-08-10 NOTE — ED Triage Notes (Signed)
Pt BIB by EMS from dialysis. Staff states pt started having auditory and visual hallucinations while receiving treatment. Staff at clinic states pt missed his previous dialysis appointment and did not finish full treatment today. Pt agitated on arrival and repeating multiple phrases and "swatting" at hallucinations. Pt able to redirected after multiple attempts. Denies any thought of harming self. Cooperative with staff.

## 2023-08-11 ENCOUNTER — Inpatient Hospital Stay (HOSPITAL_COMMUNITY): Payer: Medicare Other

## 2023-08-11 ENCOUNTER — Emergency Department (HOSPITAL_COMMUNITY): Payer: Medicare Other

## 2023-08-11 DIAGNOSIS — R7989 Other specified abnormal findings of blood chemistry: Secondary | ICD-10-CM

## 2023-08-11 DIAGNOSIS — I132 Hypertensive heart and chronic kidney disease with heart failure and with stage 5 chronic kidney disease, or end stage renal disease: Secondary | ICD-10-CM | POA: Diagnosis present

## 2023-08-11 DIAGNOSIS — E1122 Type 2 diabetes mellitus with diabetic chronic kidney disease: Secondary | ICD-10-CM | POA: Diagnosis present

## 2023-08-11 DIAGNOSIS — Z8249 Family history of ischemic heart disease and other diseases of the circulatory system: Secondary | ICD-10-CM | POA: Diagnosis not present

## 2023-08-11 DIAGNOSIS — J45909 Unspecified asthma, uncomplicated: Secondary | ICD-10-CM | POA: Diagnosis present

## 2023-08-11 DIAGNOSIS — T426X5A Adverse effect of other antiepileptic and sedative-hypnotic drugs, initial encounter: Secondary | ICD-10-CM | POA: Diagnosis present

## 2023-08-11 DIAGNOSIS — I452 Bifascicular block: Secondary | ICD-10-CM | POA: Diagnosis present

## 2023-08-11 DIAGNOSIS — H109 Unspecified conjunctivitis: Secondary | ICD-10-CM

## 2023-08-11 DIAGNOSIS — G9340 Encephalopathy, unspecified: Secondary | ICD-10-CM | POA: Diagnosis not present

## 2023-08-11 DIAGNOSIS — N2581 Secondary hyperparathyroidism of renal origin: Secondary | ICD-10-CM | POA: Diagnosis present

## 2023-08-11 DIAGNOSIS — E1151 Type 2 diabetes mellitus with diabetic peripheral angiopathy without gangrene: Secondary | ICD-10-CM | POA: Diagnosis present

## 2023-08-11 DIAGNOSIS — R4182 Altered mental status, unspecified: Secondary | ICD-10-CM

## 2023-08-11 DIAGNOSIS — R443 Hallucinations, unspecified: Principal | ICD-10-CM

## 2023-08-11 DIAGNOSIS — Z951 Presence of aortocoronary bypass graft: Secondary | ICD-10-CM | POA: Diagnosis not present

## 2023-08-11 DIAGNOSIS — Z8616 Personal history of COVID-19: Secondary | ICD-10-CM | POA: Diagnosis not present

## 2023-08-11 DIAGNOSIS — I420 Dilated cardiomyopathy: Secondary | ICD-10-CM | POA: Diagnosis present

## 2023-08-11 DIAGNOSIS — E876 Hypokalemia: Secondary | ICD-10-CM | POA: Diagnosis present

## 2023-08-11 DIAGNOSIS — H548 Legal blindness, as defined in USA: Secondary | ICD-10-CM | POA: Diagnosis present

## 2023-08-11 DIAGNOSIS — J9811 Atelectasis: Secondary | ICD-10-CM | POA: Diagnosis present

## 2023-08-11 DIAGNOSIS — I252 Old myocardial infarction: Secondary | ICD-10-CM | POA: Diagnosis not present

## 2023-08-11 DIAGNOSIS — G928 Other toxic encephalopathy: Secondary | ICD-10-CM | POA: Diagnosis present

## 2023-08-11 DIAGNOSIS — D631 Anemia in chronic kidney disease: Secondary | ICD-10-CM | POA: Diagnosis present

## 2023-08-11 DIAGNOSIS — Z992 Dependence on renal dialysis: Secondary | ICD-10-CM

## 2023-08-11 DIAGNOSIS — N186 End stage renal disease: Secondary | ICD-10-CM

## 2023-08-11 DIAGNOSIS — Z885 Allergy status to narcotic agent status: Secondary | ICD-10-CM | POA: Diagnosis not present

## 2023-08-11 DIAGNOSIS — E785 Hyperlipidemia, unspecified: Secondary | ICD-10-CM | POA: Diagnosis present

## 2023-08-11 DIAGNOSIS — I5022 Chronic systolic (congestive) heart failure: Secondary | ICD-10-CM | POA: Diagnosis present

## 2023-08-11 DIAGNOSIS — I2510 Atherosclerotic heart disease of native coronary artery without angina pectoris: Secondary | ICD-10-CM | POA: Diagnosis present

## 2023-08-11 DIAGNOSIS — Z888 Allergy status to other drugs, medicaments and biological substances status: Secondary | ICD-10-CM | POA: Diagnosis not present

## 2023-08-11 HISTORY — DX: Encephalopathy, unspecified: G93.40

## 2023-08-11 LAB — BRAIN NATRIURETIC PEPTIDE: B Natriuretic Peptide: 2012.6 pg/mL — ABNORMAL HIGH (ref 0.0–100.0)

## 2023-08-11 LAB — MAGNESIUM: Magnesium: 2 mg/dL (ref 1.7–2.4)

## 2023-08-11 LAB — VITAMIN B12: Vitamin B-12: 600 pg/mL (ref 180–914)

## 2023-08-11 LAB — TROPONIN I (HIGH SENSITIVITY)
Troponin I (High Sensitivity): 130 ng/L (ref <18)
Troponin I (High Sensitivity): 119 ng/L (ref <18)

## 2023-08-11 LAB — TSH: TSH: 2.744 u[IU]/mL (ref 0.350–4.500)

## 2023-08-11 LAB — PROCALCITONIN: Procalcitonin: 0.46 ng/mL

## 2023-08-11 LAB — PHOSPHORUS: Phosphorus: 6.4 mg/dL — ABNORMAL HIGH (ref 2.5–4.6)

## 2023-08-11 LAB — VITAMIN D 25 HYDROXY (VIT D DEFICIENCY, FRACTURES): Vit D, 25-Hydroxy: 15.94 ng/mL — ABNORMAL LOW (ref 30–100)

## 2023-08-11 MED ORDER — ONDANSETRON HCL 4 MG PO TABS
4.0000 mg | ORAL_TABLET | Freq: Four times a day (QID) | ORAL | Status: DC | PRN
Start: 2023-08-11 — End: 2023-08-14

## 2023-08-11 MED ORDER — ACETAMINOPHEN 650 MG RE SUPP: 650.0000 mg | Freq: Four times a day (QID) | RECTAL | Status: DC | PRN

## 2023-08-11 MED ORDER — ACETAMINOPHEN 325 MG PO TABS
650.0000 mg | ORAL_TABLET | Freq: Four times a day (QID) | ORAL | Status: DC | PRN
  Administered 2023-08-13: 650 mg via ORAL
  Filled 2023-08-11: qty 2

## 2023-08-11 MED ORDER — FERRIC CITRATE 1 GM 210 MG(FE) PO TABS
420.0000 mg | ORAL_TABLET | Freq: Three times a day (TID) | ORAL | Status: DC
  Administered 2023-08-11 – 2023-08-13 (×6): 420 mg via ORAL
  Filled 2023-08-11 (×8): qty 2

## 2023-08-11 MED ORDER — LORAZEPAM 2 MG/ML IJ SOLN
2.0000 mg | Freq: Once | INTRAMUSCULAR | Status: AC | PRN
  Administered 2023-08-11: 2 mg via INTRAVENOUS
  Filled 2023-08-11 (×2): qty 1

## 2023-08-11 MED ORDER — CARVEDILOL 12.5 MG PO TABS
12.5000 mg | ORAL_TABLET | Freq: Two times a day (BID) | ORAL | Status: DC
  Administered 2023-08-11 – 2023-08-13 (×5): 12.5 mg via ORAL
  Filled 2023-08-11 (×5): qty 1

## 2023-08-11 MED ORDER — IRBESARTAN 150 MG PO TABS
150.0000 mg | ORAL_TABLET | Freq: Every day | ORAL | Status: DC
  Administered 2023-08-11 – 2023-08-13 (×3): 150 mg via ORAL
  Filled 2023-08-11 (×3): qty 1

## 2023-08-11 MED ORDER — SENNOSIDES-DOCUSATE SODIUM 8.6-50 MG PO TABS: 1.0000 | ORAL_TABLET | Freq: Every evening | ORAL | Status: DC | PRN

## 2023-08-11 MED ORDER — LABETALOL HCL 5 MG/ML IV SOLN
10.0000 mg | Freq: Once | INTRAVENOUS | Status: AC
  Administered 2023-08-11: 10 mg via INTRAVENOUS
  Filled 2023-08-11: qty 4

## 2023-08-11 MED ORDER — HEPARIN SODIUM (PORCINE) 5000 UNIT/ML IJ SOLN
5000.0000 [IU] | Freq: Three times a day (TID) | INTRAMUSCULAR | Status: DC
  Administered 2023-08-11 – 2023-08-13 (×8): 5000 [IU] via SUBCUTANEOUS
  Filled 2023-08-11 (×8): qty 1

## 2023-08-11 MED ORDER — ATORVASTATIN CALCIUM 80 MG PO TABS
80.0000 mg | ORAL_TABLET | Freq: Every day | ORAL | Status: DC
Start: 2023-08-11 — End: 2023-08-14
  Administered 2023-08-11 – 2023-08-13 (×3): 80 mg via ORAL
  Filled 2023-08-11 (×3): qty 1

## 2023-08-11 MED ORDER — ORAL CARE MOUTH RINSE: 15.0000 mL | OROMUCOSAL | Status: DC | PRN

## 2023-08-11 MED ORDER — RENA-VITE PO TABS
1.0000 | ORAL_TABLET | Freq: Every day | ORAL | Status: DC
Start: 2023-08-11 — End: 2023-08-14
  Administered 2023-08-11 – 2023-08-13 (×3): 1 via ORAL
  Filled 2023-08-11 (×3): qty 1

## 2023-08-11 MED ORDER — ONDANSETRON HCL 4 MG/2ML IJ SOLN: 4.0000 mg | Freq: Four times a day (QID) | INTRAMUSCULAR | Status: DC | PRN

## 2023-08-11 MED ORDER — LORAZEPAM 2 MG/ML IJ SOLN: 0.5000 mg | INTRAMUSCULAR | Status: DC | PRN

## 2023-08-11 MED ORDER — LABETALOL HCL 5 MG/ML IV SOLN
20.0000 mg | Freq: Once | INTRAVENOUS | Status: AC
  Administered 2023-08-11: 20 mg via INTRAVENOUS
  Filled 2023-08-11: qty 4

## 2023-08-11 MED ORDER — POLYMYXIN B-TRIMETHOPRIM 10000-0.1 UNIT/ML-% OP SOLN
1.0000 [drp] | Freq: Four times a day (QID) | OPHTHALMIC | Status: DC
  Administered 2023-08-11 – 2023-08-13 (×9): 1 [drp] via OPHTHALMIC
  Filled 2023-08-11: qty 10

## 2023-08-11 MED ORDER — CLOPIDOGREL BISULFATE 75 MG PO TABS
75.0000 mg | ORAL_TABLET | Freq: Every day | ORAL | Status: DC
  Administered 2023-08-11 – 2023-08-13 (×3): 75 mg via ORAL
  Filled 2023-08-11 (×3): qty 1

## 2023-08-11 MED ORDER — ASPIRIN 81 MG PO TBEC
81.0000 mg | DELAYED_RELEASE_TABLET | Freq: Every day | ORAL | Status: DC
Start: 2023-08-11 — End: 2023-08-14
  Administered 2023-08-11 – 2023-08-13 (×3): 81 mg via ORAL
  Filled 2023-08-11 (×3): qty 1

## 2023-08-11 NOTE — Progress Notes (Signed)
 EEG complete - results pending

## 2023-08-11 NOTE — ED Provider Notes (Signed)
  Provider Note MRN:  [id number]  Arrival date & time: 08/11/23    ED Course and Medical Decision Making  Assumed care of patient at sign-out or upon transfer.  Hallucinations chest pain, unclear cause.  On my evaluation patient is hypertensive, still responding to external stimuli but answering all orientation questions accurately.  CT head unremarkable.  Considering press or hypertensive encephalopathy.  Providing labetalol for blood pressure control.  Neurology consulted, recommending MRI however there is a very short list of medical causes of acute hallucinations.  Plan is for hospitalist admission for blood pressure control.  .Critical Care  Performed by: Sabas Sous, MD Authorized by: Sabas Sous, MD   Critical care provider statement:    Critical care time (minutes):  35   Critical care was necessary to treat or prevent imminent or life-threatening deterioration of the following conditions: Hypertensive urgency.   Critical care was time spent personally by me on the following activities:  Development of treatment plan with patient or surrogate, discussions with consultants, evaluation of patient's response to treatment, examination of patient, ordering and review of laboratory studies, ordering and review of radiographic studies, ordering and performing treatments and interventions, pulse oximetry, re-evaluation of patient's condition and review of old charts   Final Clinical Impressions(s) / ED Diagnoses     ICD-10-CM   1. Hallucinations  R44.3     2. End stage renal disease on dialysis (HCC)  N18.6    Z99.2     3. Elevated troponin  R79.89       ED Discharge Orders     None       Discharge Instructions   None     Elmer Sow. Pilar Plate, MD [facility] Emergency [facility] [email or web address]    Sabas Sous, MD 08/11/23 Emeline Darling

## 2023-08-11 NOTE — Progress Notes (Signed)
Patient received from ED, very drowsy and sleepy, but answered orientation question and is oriented to self, time, place but not to situation, Bp in 150s, Spo2 99% in nasal cannula @2lt , tele box connected, bed alarm on, will continue to monitor

## 2023-08-11 NOTE — Plan of Care (Signed)
  Problem: Education: Goal: Knowledge of General Education information will improve Description: Including pain rating scale, medication(s)/side effects and non-pharmacologic comfort measures Outcome: Progressing   Problem: Clinical Measurements: Goal: Respiratory complications will improve Outcome: Progressing Goal: Cardiovascular complication will be avoided Outcome: Progressing   Problem: Nutrition: Goal: Adequate nutrition will be maintained Outcome: Progressing   Problem: Skin Integrity: Goal: Risk for impaired skin integrity will decrease Outcome: Progressing

## 2023-08-11 NOTE — H&P (Addendum)
History and Physical  Corey Park JXB:[id number] DOB: 03/03/1971 DOA: 08/10/2023  PCP: [name], NP   Chief Complaint: Hallucination  HPI: Corey Park is a 53 y.o. male with medical history significant for ESRD on HD MWF, T2DM, dilated cardiomyopathy, NSTEMI/CAD  s/p CABG, legally blind, HTN, and HLD who presents to the ED from his dialysis center for evaluation of hallucinations.  Patient apparently missed his dialysis on Wednesday and while receiving HD on Friday, he started getting agitated and hallucinating.  Patient started repeating multiple phrases and swatting at the hallucinations.  EMS was called to transport patient to the ED before he could finish dialysis.  On my evaluation, patient very drowsy with eyes closed.  He answers questions intermittently but then falls back to sleep and starts snoring. He denies any chest pain or shortness of breath but unable to tell me if he is still having hallucinations.  He has intermittent jerking of his lower extremities.  ED Course: Initial vitals shows temp 99.3, RR 20, HR 92, BP 176/90, SpO2 97% on room air. Labs show stable hemoglobin at 8.9,, normal white count, stable renal function, K+ 3.4, normal lipase, ethanol level, salicylate and acetaminophen levels. Troponin elevated to 106->110. CXR with mild bibasilar atelectasis and/or infiltrate.  EKG showed some ST depressions in the lateral leads and RBBB. ED provider was concerned for posterior MI so on-call cardiology was consulted to review EKG. On their read, EKG did not meet criteria for STEMI. Patient initially received IV Ativan 1 mg prior to CT but was unable to sit still so he was brought back. He then received another IV Ativan 2 mg and completed CT head which was negative. Neurology was consulted and recommended MRI brain to rule out PRES. patient also received IV labetalol 10 mg x 1 followed by IV labetalol 20 mg x 1 for his elevated BP. TRH was consulted for evaluation.    Review of Systems: Please see HPI for pertinent positives and negatives. A complete 10 system review of systems are otherwise negative.  Past Medical History:  Diagnosis Date   Allergy, unspecified, initial encounter 08/25/2019   Asthma    as a child   Cellulitis, perineum 11/26/2019   CHF (congestive heart failure) (HCC) 2021   Complication of vascular dialysis catheter 08/25/2019   COVID-19 virus infection 07/27/2019   Diabetes mellitus without complication (HCC)    type 2   Dilated cardiomyopathy (HCC) 07/03/2018   ESRD on hemodialysis (HCC) 06/17/2018   MWF at [facility] [place]   Fe deficiency anemia 12/23/2018   Hypertension    Legally blind    B/L   Pneumonia 2021   Type 2 diabetes mellitus with diabetic peripheral angiopathy without gangrene (HCC) 08/25/2019   Unspecified protein-calorie malnutrition (HCC) 08/25/2019   Past Surgical History:  Procedure Laterality Date   BASCILIC VEIN TRANSPOSITION Right 10/16/2019   Procedure: Basilic Vein Transposition Right Arm;  Surgeon: Nada Libman, MD;  Location: [facility] OR;  Service: Vascular;  Laterality: Right;   BASCILIC VEIN TRANSPOSITION Right 01/29/2020   Procedure: RIGHT ARM SECOND STAGE BASCILIC VEIN TRANSPOSITION;  Surgeon: Nada Libman, MD;  Location: [facility] OR;  Service: Vascular;  Laterality: Right;   BIOPSY  12/22/2018   Procedure: BIOPSY;  Surgeon: Carman Ching, MD;  Location: [facility] ENDOSCOPY;  Service: Endoscopy;;   CORONARY ARTERY BYPASS GRAFT N/A 06/28/2023   Procedure: CORONARY ARTERY BYPASS GRAFTING TIMES THREE USING LEFT INTERNAL MAMMARY ARTERY AND RIGHT GREAT SAPHENOUS VEIN HARVESTED ENDOSCOPICALLY;  Surgeon: Brynda Greathouse  O, MD;  Location: [facility] OR;  Service: Open Heart Surgery;  Laterality: N/A;   CORONARY ULTRASOUND/IVUS N/A 12/13/2022   Procedure: Coronary Ultrasound/IVUS;  Surgeon: Corky Crafts, MD;  Location: [facility] INVASIVE CV LAB;  Service: Cardiovascular;  Laterality: N/A;   ESOPHAGOGASTRODUODENOSCOPY N/A 12/22/2018    Procedure: ESOPHAGOGASTRODUODENOSCOPY (EGD);  Surgeon: Carman Ching, MD;  Location: [facility] ENDOSCOPY;  Service: Endoscopy;  Laterality: N/A;   IR FLUORO GUIDE CV LINE RIGHT  07/29/2019   IR US GUIDE VASC ACCESS RIGHT  07/29/2019   LEFT HEART CATH AND CORONARY ANGIOGRAPHY N/A 12/13/2022   Procedure: LEFT HEART CATH AND CORONARY ANGIOGRAPHY;  Surgeon: Corky Crafts, MD;  Location: [facility] INVASIVE CV LAB;  Service: Cardiovascular;  Laterality: N/A;   LEFT HEART CATH AND CORONARY ANGIOGRAPHY N/A 06/25/2023   Procedure: LEFT HEART CATH AND CORONARY ANGIOGRAPHY;  Surgeon: Kathleene Hazel, MD;  Location: [facility] INVASIVE CV LAB;  Service: Cardiovascular;  Laterality: N/A;   TEE WITHOUT CARDIOVERSION N/A 06/28/2023   Procedure: TRANSESOPHAGEAL ECHOCARDIOGRAM (TEE);  Surgeon: Corliss Skains, MD;  Location: [facility] OR;  Service: Open Heart Surgery;  Laterality: N/A;   TRANSESOPHAGEAL ECHOCARDIOGRAM (CATH LAB) N/A 05/10/2023   Procedure: TRANSESOPHAGEAL ECHOCARDIOGRAM;  Surgeon: Sande Rives, MD;  Location: [facility] INVASIVE CV LAB;  Service: Cardiovascular;  Laterality: N/A;   Social History:  reports that he has never smoked. He has never used smokeless tobacco. He reports that he does not currently use alcohol. He reports that he does not currently use drugs after having used the following drugs: Marijuana.  Allergies  Allergen Reactions   Imdur [Isosorbide Nitrate] Other (See Comments)    Endorsed headache in the past - willing to retry    Family History  Problem Relation Age of Onset   CAD Mother    Hypertension Mother    Diabetes Neg Hx    Stroke Neg Hx    Cancer Neg Hx    Kidney failure Neg Hx    Stomach cancer Neg Hx    Colon cancer Neg Hx    Rectal cancer Neg Hx      Prior to Admission medications   Medication Sig Start Date End Date Taking? Authorizing Provider  aspirin EC 81 MG tablet Take 1 tablet (81 mg total) by mouth daily. Swallow whole. 07/08/23  Yes Gold, Wayne E, PA-C   carvedilol (COREG) 12.5 MG tablet Take 1 tablet (12.5 mg total) by mouth 2 (two) times daily with a meal. 07/08/23  Yes Gold, Wayne E, PA-C  gabapentin (NEURONTIN) 100 MG capsule Take 1 capsule (100 mg total) by mouth 2 (two) times daily as needed. Patient taking differently: Take 100 mg by mouth 3 (three) times daily as needed (nerve pain). 07/08/23  Yes Gold, Deniece Portela E, PA-C  multivitamin (RENA-VIT) TABS tablet Take 1 tablet by mouth daily. 11/22/22  Yes [provider]  olmesartan (BENICAR) 20 MG tablet Take 1 tablet (20 mg total) by mouth daily. 12/14/22  Yes Modena Slater, DO  pantoprazole (PROTONIX) 20 MG tablet Take 20 mg by mouth daily as needed for heartburn. 04/19/23  Yes [provider]  alum & mag hydroxide-simeth (MAALOX/MYLANTA) 200-200-20 MG/5ML suspension Take 30 mLs by mouth every 6 (six) hours as needed for indigestion or heartburn.    [provider]  atorvastatin (LIPITOR) 80 MG tablet Take 1 tablet (80 mg total) by mouth daily. Patient not taking: Reported on 06/25/2023 12/15/22   Modena Slater, DO  calcium acetate (PHOSLO) 667 MG capsule Take 3  capsules (2,001 mg total) by mouth with breakfast, with lunch, and with evening meal. 07/08/23   Gold, Glenice Laine, PA-C  clopidogrel (PLAVIX) 75 MG tablet Take 1 tablet (75 mg total) by mouth daily. 07/08/23   Rowe Clack, PA-C  clopidogrel (PLAVIX) 75 MG tablet Take 1 tablet (75 mg total) by mouth daily. 07/08/23   Gold, Wayne E, PA-C  famotidine (PEPCID) 20 MG tablet Take 20 mg by mouth daily. 04/20/23   [provider]  ferric citrate (AURYXIA) 1 GM 210 MG(Fe) tablet Take 2 tablets (420 mg total) by mouth daily. Patient taking differently: Take 420 mg by mouth 3 (three) times daily with meals. 12/14/22   Modena Slater, DO  oxyCODONE (OXY IR/ROXICODONE) 5 MG immediate release tablet Take 1 tablet (5 mg total) by mouth every 6 (six) hours as needed for severe pain (pain score 7-10). 07/08/23   Rowe Clack, PA-C     Physical Exam: BP (!) 177/93 (BP Location: Left Arm)   Pulse 83   Temp 99.2 F (37.3 C) (Axillary)   Resp (!) 22   Ht 6\' 1"  (1.854 m)   Wt 97.5 kg   SpO2 100%   BMI 28.37 kg/m  General: Sleepy appearing middle-age man laying in bed. No acute distress. HEENT: [place]/AT. Legally blind. Left eye with mild eyelid swelling, mild conjunctival injection with some purulent drainage.  CV: RRR. No murmurs, rubs, or gallops. Trace BLE edema Pulmonary: On 2 L [place]. Lungs CTAB. Normal effort. No wheezing or rales. Abdominal: Soft, nontender, nondistended. Normal bowel sounds. Extremities: Palpable radial and DP pulses. Normal ROM. Skin: Warm and dry. No obvious rash or lesions. Neuro: Somnolent but follows commands and answers questions intermittently. Moves all extremities. Intermittent jerking of the legs. Psych: Normal mood and affect           Labs on Admission:  Basic Metabolic Panel: Recent Labs  Lab 08/10/23 2047  NA 138  K 3.4*  CL 94*  CO2 27  GLUCOSE 105*  BUN 27*  CREATININE 9.23*  CALCIUM 9.0   Liver Function Tests: Recent Labs  Lab 08/10/23 2047  AST 22  ALT 10  ALKPHOS 51  BILITOT 0.9  PROT 8.4*  ALBUMIN 3.5   Recent Labs  Lab 08/10/23 2047  LIPASE 37   No results for input(s): "AMMONIA" in the last 168 hours. CBC: Recent Labs  Lab 08/10/23 2047  WBC 6.9  NEUTROABS 4.9  HGB 8.9*  HCT 28.1*  MCV 94.6  PLT 222   Cardiac Enzymes: No results for input(s): "CKTOTAL", "CKMB", "CKMBINDEX", "TROPONINI" in the last 168 hours. BNP (last 3 results) Recent Labs    06/25/23 0203 06/25/23 0448 07/25/23 2330  BNP 620.3* 480.0* 1,545.2*    ProBNP (last 3 results) No results for input(s): "PROBNP" in the last 8760 hours.  CBG: No results for input(s): "GLUCAP" in the last 168 hours.  Radiological Exams on Admission: CT Head Wo Contrast Result Date: 08/11/2023 CLINICAL DATA:  Altered mental status EXAM: CT HEAD WITHOUT CONTRAST TECHNIQUE: Contiguous  axial images were obtained from the base of the skull through the vertex without intravenous contrast. RADIATION DOSE REDUCTION: This exam was performed according to the departmental dose-optimization program which includes automated exposure control, adjustment of the mA and/or kV according to patient size and/or use of iterative reconstruction technique. COMPARISON:  12/21/2018 FINDINGS: Brain: No evidence of acute infarction, hemorrhage, hydrocephalus, extra-axial collection or mass lesion/mass effect. Vascular: No hyperdense vessel or unexpected calcification. Skull: Normal.  Negative for fracture or focal lesion. Sinuses/Orbits: No acute finding. Other: None. IMPRESSION: No acute intracranial abnormality noted. Electronically Signed   By: Alcide Clever M.D.   On: 08/11/2023 00:49   DG Chest Port 1 View Result Date: 08/10/2023 CLINICAL DATA:  Altered mental status. EXAM: PORTABLE CHEST 1 VIEW COMPARISON:  July 25, 2023 FINDINGS: Multiple sternal wires and vascular clips are seen. The cardiac silhouette is mildly enlarged and unchanged in size. Low lung volumes are noted. Mild, stable areas of atelectasis and/or infiltrate are seen within the bilateral lung bases. No pleural effusion or pneumothorax is identified. No acute osseous abnormalities are identified. IMPRESSION: 1. Evidence of prior median sternotomy/CABG. 2. Low lung volumes with mild bibasilar atelectasis and/or infiltrate. Electronically Signed   By: Aram Candela M.D.   On: 08/10/2023 21:36   Assessment/Plan Corey Park is a 52 y.o. male with medical history significant for ESRD on HD MWF, T2DM, dilated cardiomyopathy, HFrEF, NSTEMI/CAD  s/p CABG, legally blind, HTN, and HLD who presents to the ED from his dialysis center for evaluation of hallucinations and admitted for acute encephalopathy  # Acute encephalopathy Legally blind ESRD patient presented with reported auditory and visual hallucinations. Patient oriented but remains  fairly sleepy on evaluation s/p total of 3 mg IV Ativan earlier this morning prior to CT head. Renal function unremarkable. He has normal salicylate, acetaminophen and ethanol levels. CXR and CTH with no acute findings. No history of psychosis or hallucinations. After further discussion with patient's mother, I was informed that patient has had intermittent hallucinations since his CABG, he has been calling his mother in the middle of the night stating somebody is trying to kill him and seeing children in the house. The cause of his encephalopathy is unclear at this point. Will further evaluate with MRI brain and EEG. -Admit to telemetry bed -Follow-up MRI brain and EEG -Follow-up vitamin B12, vitamin D, TSH -Avoid neurotoxic meds -Delirium precautions  # ESRD on HD Patient on HD MWF. Reportedly missed HD on Wednesday and did not complete on Friday due to hallucinations and agitation. He is in no respiratory distress and looks euvolemic on exam. Renal function with mild hypokalemia but BUN only elevated to 27 with creatinine of 9.23.  Will consult nephrology for evaluation and HD during this hospitalization -Nephrology consulted, will see patient tomorrow evening for HD Monday if he is still here. -Avoid nephrotoxic agents -Trend renal function  # CAD/NSTEMI s/p CABG # Elevated troponin Patient had NSTEMI at the end of December had a CABG on 1/2.  He does not endorse any chest pain on my evaluation.  Troponins elevated to 106 then slightly up trended to 110. EKG with some nonspecific ST and T wave changes not consistent with ACS per on-call cardiology.  Will admit to telemetry and trend troponins. -Continue aspirin, plavix and atorvastatin -Trend troponin until peak -Telemetry  # Severe hypertension Patient found to have significantly elevated BP with SBP in the 170s to 190s.  He has had some improvement with SBP in the 150s to 170s since receiving home Coreg and multiple doses of IV labetalol  in the ED. -Continue Coreg and olmesartan (Irbesartan as a substitute)  # HFrEF # Dilated cardiomyopathy TTE on December 30th showed EF 40-45%, and G2DD. Chest x-ray does not show any pulmonary edema.  Patient not significantly fluid overloaded on exam. -Strict I&O's, daily weight -Trend and replete electrolytes -Follow-up BNP  # Conjunctivitis Patient reports that he has had some problems with his  left eye for about a week. Mother reports that his left eye has looked more swollen and red over the last week. On exam, patient has mild swelling of his left eyelids with injection of his conjunctiva and mild purulent drainage. This is likely bacterial conjunctivitis. -Polytrim eyedrops, 1 drop in left eye every 6 hours -Apply warm compressions to the left eye  # T2DM Last A1c 5.8% 1 month ago -SSI with meals  # Hyperlipidemia -Continue atorvastatin  DVT prophylaxis: Heparin    Code Status: Full Code  Consults called:  Nephrology  Family Communication: No family at bedside  Severity of Illness: The appropriate patient status for this patient is INPATIENT. Inpatient status is judged to be reasonable and necessary in order to provide the required intensity of service to ensure the patient's safety. The patient's presenting symptoms, physical exam findings, and initial radiographic and laboratory data in the context of their chronic comorbidities is felt to place them at high risk for further clinical deterioration. Furthermore, it is not anticipated that the patient will be medically stable for discharge from the hospital within 2 midnights of admission.   * I certify that at the point of admission it is my clinical judgment that the patient will require inpatient hospital care spanning beyond 2 midnights from the point of admission due to high intensity of service, high risk for further deterioration and high frequency of surveillance required.*  Level of care: Telemetry Medical    Steffanie Rainwater, MD 08/11/2023, 9:00 AM [facility] Pager: [phone] Isaiah 41:10   If 7PM-7AM, please contact night-coverage [email or web address] Password [email or web address]

## 2023-08-11 NOTE — Procedures (Signed)
Routine EEG Report  Corey Park is a 53 y.o. male with a history of altered mental status who is undergoing an EEG to evaluate for seizures.  Report: This EEG was acquired with electrodes placed according to the International 10-20 electrode system (including Fp1, Fp2, F3, F4, C3, C4, P3, P4, O1, O2, T3, T4, T5, T6, A1, A2, Fz, Cz, Pz). The following electrodes were missing or displaced: none.  The occipital dominant rhythm was 3-6 Hz with overriding beta frequencies. This activity is reactive to stimulation. Drowsiness was manifested by background fragmentation; deeper stages of sleep were identified by K complexes and sleep spindles. There was no focal slowing. There were no interictal epileptiform discharges. There were no electrographic seizures identified. There was no abnormal response to photic stimulation or hyperventilation.   Impression and clinical correlation: This EEG was obtained while awake and asleep and is abnormal due to moderate-to-severe diffuse slowing indicative of global cerebral dysfunction. Epileptiform abnormalities were not seen during this recording.  Bing Neighbors, MD [facility] [phone]  If 7pm- 7am, please page neurology on call as listed in AMION.

## 2023-08-12 ENCOUNTER — Inpatient Hospital Stay (HOSPITAL_COMMUNITY): Payer: Medicare Other

## 2023-08-12 ENCOUNTER — Encounter (HOSPITAL_COMMUNITY): Payer: Self-pay | Admitting: Student

## 2023-08-12 DIAGNOSIS — G9340 Encephalopathy, unspecified: Secondary | ICD-10-CM | POA: Diagnosis not present

## 2023-08-12 LAB — RENAL FUNCTION PANEL
Albumin: 2.7 g/dL — ABNORMAL LOW (ref 3.5–5.0)
Anion gap: 15 (ref 5–15)
BUN: 39 mg/dL — ABNORMAL HIGH (ref 6–20)
CO2: 29 mmol/L (ref 22–32)
Calcium: 8.8 mg/dL — ABNORMAL LOW (ref 8.9–10.3)
Chloride: 94 mmol/L — ABNORMAL LOW (ref 98–111)
Creatinine, Ser: 11.96 mg/dL — ABNORMAL HIGH (ref 0.61–1.24)
GFR, Estimated: 5 mL/min — ABNORMAL LOW (ref >60)
Glucose, Bld: 141 mg/dL — ABNORMAL HIGH (ref 70–99)
Phosphorus: 6.2 mg/dL — ABNORMAL HIGH (ref 2.5–4.6)
Potassium: 3.5 mmol/L (ref 3.5–5.1)
Sodium: 138 mmol/L (ref 135–145)

## 2023-08-12 LAB — CBC WITH DIFFERENTIAL/PLATELET
Abs Immature Granulocytes: 0.02 10*3/uL (ref 0.00–0.07)
Basophils Absolute: 0 10*3/uL (ref 0.0–0.1)
Basophils Relative: 0 %
Eosinophils Absolute: 0.3 10*3/uL (ref 0.0–0.5)
Eosinophils Relative: 5 %
HCT: 25 % — ABNORMAL LOW (ref 39.0–52.0)
Hemoglobin: 8 g/dL — ABNORMAL LOW (ref 13.0–17.0)
Immature Granulocytes: 0 %
Lymphocytes Relative: 18 %
Lymphs Abs: 1.1 10*3/uL (ref 0.7–4.0)
MCH: 29.9 pg (ref 26.0–34.0)
MCHC: 32 g/dL (ref 30.0–36.0)
MCV: 93.3 fL (ref 80.0–100.0)
Monocytes Absolute: 0.8 10*3/uL (ref 0.1–1.0)
Monocytes Relative: 13 %
Neutro Abs: 4.1 10*3/uL (ref 1.7–7.7)
Neutrophils Relative %: 64 %
Platelets: 194 10*3/uL (ref 150–400)
RBC: 2.68 MIL/uL — ABNORMAL LOW (ref 4.22–5.81)
RDW: 17.5 % — ABNORMAL HIGH (ref 11.5–15.5)
WBC: 6.4 10*3/uL (ref 4.0–10.5)
nRBC: 0 % (ref 0.0–0.2)

## 2023-08-12 MED ORDER — CHLORHEXIDINE GLUCONATE CLOTH 2 % EX PADS
6.0000 | MEDICATED_PAD | Freq: Every day | CUTANEOUS | Status: DC
  Administered 2023-08-13: 6 via TOPICAL

## 2023-08-12 MED ORDER — VITAMIN D (ERGOCALCIFEROL) 1.25 MG (50000 UNIT) PO CAPS
50000.0000 [IU] | ORAL_CAPSULE | ORAL | Status: DC
  Administered 2023-08-12: 50000 [IU] via ORAL
  Filled 2023-08-12: qty 1

## 2023-08-12 MED ORDER — CALCIUM ACETATE (PHOS BINDER) 667 MG PO CAPS: 2001.0000 mg | ORAL_CAPSULE | Freq: Three times a day (TID) | ORAL | Status: DC

## 2023-08-12 NOTE — Progress Notes (Signed)
TRH ROUNDING  NOTE Corey Park VWU:[id number]  DOB: 1970/10/22  DOA: 08/10/2023  PCP: [name], NP  08/12/2023,7:24 AM  LOS: 1 day   Code Status: Full code from: Home current Dispo: Likely home   53 year old black male-gets [facility] typically CAD with prior cath 11/2022 ---12/30-1/05/2024 admission for NSTEMI status post CABG X3 complicated by DKA DM TY 2 Previous concern for echodense mobile mass posterior mitral valve leaflet vegetation ruled out by TEE 05/10/2023 ESRD MWF HTN  2/14 brought to ED-missed dialysis on 2/12-during HD 2/14 auditory visual hallucinations did not finish full treatment became agitated-concern on admission for press syndrome/encephalopathy-neurology consulted-severely hypertensive in ED MRI brain EEG ordered Sodium 138 potassium 3.4 BUN/creatinine 27/9.2 alk phos 51 LFTs normal  WBC 6.9 hemoglobin 8.9 platelet 222 CXR low lung volumes with mild bibasilar atelectasis infiltrate EKG over read as being normal sinus rhythm PR interval 0.08 left anterior fascicular block ST depressions V1 through V3 with baseline V6 troponin 106 with flat trend peak 130--- BNP 212 Cardiologist was consulted and felt pattern not consistent with ACS CT head showed no acute infarction hemorrhage or hydrocephalus   Plan  Toxic metabolic encephalopathy on admission  MRI brain does not confirm press syndrome and has no acute findings-await EEG He has had intermittent hallucinations delusions since CABG have seen somebody trying to kill him and also seeing children in the house--I think he has more delirium than encephalopathy and if this is chronic would not workup further Would discontinue his oxycodone 5 as well as his gabapentin 100 twice daily in the setting of renal insufficiency Follow EEG that is pending, follow B12 was 600--- TSH is negative at 2.744 Vitamin D is low we will supplement with ergocalciferol 50,000  units   ESRD MWF  Missed HD previously  Nephrology aware of patient and will consult for routine HD Can resume when taking p.o. usual PhosLo 3 tabs breakfast lunch dinner Check phos-resume Auryxia 2 tabs daily  NSTEMI with prior CABG X3 06/28/2023 Dilated cardiomyopathy--Echo 12/30 EF 40 to 45% Severe hypertension on admission Troponins were slightly elevated, EKG with some depressions, but troponins trend flat and have continued to remain that way--cardiology did not feel this required workup  Continue ASA 81, plavix 75, Coreg 12.5 twice daily atorvastatin 80, aVaPro 150 daily  Conjunctivitis Admitted with drainage from left eye additionally-placed on Polytrim 1 drop left eye every 6 and warm compresses on left eye  DM TY 2 A1c above 5.8.  At home does not appear to be on any therapy-would cover only if above 180  No family present at the bedside-monitor overnight and likely can discharge tomorrow    DVT prophylaxis: Heparin  Status is: Inpatient Remains inpatient appropriate because:   Requires monitoring for further delusions etc.    Subjective: Seems very coherent alert tells me works with [facility] seems oriented to [employer] despite blindness Has some discomfort in his left eye with pockets of fluid but otherwise is well  Objective + exam Vitals:   08/11/23 1530 08/11/23 1715 08/11/23 2327 08/12/23 0308  BP:  (!) 153/79 (!) 114/59 115/72  Pulse:   86 79  Resp:   20 18  Temp:   98.2 F (36.8 C) 98 F (36.7 C)  TempSrc:      SpO2: 97%  100% 93%  Weight:      Height:       [employer]  08/10/23 2051  Weight: 97.5 kg    Examination: Moving 4 limbs equally left eye is quite injected some discomfort behind upper lip S1-S2 no murmur Abdomen soft no rebound Is able to raise arms above head Neuro is intact no focal deficit Power is 5/5 grossly  Data Reviewed: reviewed   CBC    Component Value Date/Time   WBC 6.9 08/10/2023 2047   RBC  2.97 (L) 08/10/2023 2047   HGB 8.9 (L) 08/10/2023 2047   HGB 9.5 (L) 01/16/2019 1517   HCT 28.1 (L) 08/10/2023 2047   HCT 29.1 (L) 01/16/2019 1517   PLT 222 08/10/2023 2047   PLT 189 01/16/2019 1517   MCV 94.6 08/10/2023 2047   MCV 92 01/16/2019 1517   MCH 30.0 08/10/2023 2047   MCHC 31.7 08/10/2023 2047   RDW 17.8 (H) 08/10/2023 2047   RDW 14.9 01/16/2019 1517   LYMPHSABS 1.1 08/10/2023 2047   LYMPHSABS 1.0 01/16/2019 1517   MONOABS 0.7 08/10/2023 2047   EOSABS 0.2 08/10/2023 2047   EOSABS 0.2 01/16/2019 1517   BASOSABS 0.0 08/10/2023 2047   BASOSABS 0.0 01/16/2019 1517      Latest Ref Rng & Units 08/10/2023    8:47 PM 07/25/2023    8:37 PM 07/08/2023    3:05 AM  CMP  Glucose 70 - 99 mg/dL 725  85  366   BUN 6 - 20 mg/dL 27  12  58   Creatinine 0.61 - 1.24 mg/dL 4.40  3.47  42.59   Sodium 135 - 145 mmol/L 138  136  132   Potassium 3.5 - 5.1 mmol/L 3.4  3.7  4.8   Chloride 98 - 111 mmol/L 94  92  89   CO2 22 - 32 mmol/L 27  30  24    Calcium 8.9 - 10.3 mg/dL 9.0  8.1  9.5   Total Protein 6.5 - 8.1 g/dL 8.4     Total Bilirubin 0.0 - 1.2 mg/dL 0.9     Alkaline Phos 38 - 126 U/L 51     AST 15 - 41 U/L 22     ALT 0 - 44 U/L 10       Scheduled Meds:  aspirin EC  81 mg Oral Daily   atorvastatin  80 mg Oral Daily   carvedilol  12.5 mg Oral BID WC   clopidogrel  75 mg Oral Daily   ferric citrate  420 mg Oral TID WC   heparin  5,000 Units Subcutaneous Q8H   irbesartan  150 mg Oral Daily   multivitamin  1 tablet Oral Daily   trimethoprim-polymyxin b  1 drop Left Eye Q6H   Continuous Infusions:  Time 60  Rhetta Mura, MD  [facility]

## 2023-08-12 NOTE — Plan of Care (Signed)
Alert and oriented.  MRI completed today.  Irritable at start of shift but mood improved throughout the day.   Problem: Education: Goal: Knowledge of General Education information will improve Description: Including pain rating scale, medication(s)/side effects and non-pharmacologic comfort measures 08/12/2023 1938 by Kallie Locks, RN Outcome: Progressing 08/12/2023 1937 by Kallie Locks, RN Outcome: Progressing   Problem: Health Behavior/Discharge Planning: Goal: Ability to manage health-related needs will improve 08/12/2023 1938 by Kallie Locks, RN Outcome: Progressing 08/12/2023 1937 by Kallie Locks, RN Outcome: Progressing   Problem: Clinical Measurements: Goal: Ability to maintain clinical measurements within normal limits will improve 08/12/2023 1938 by Kallie Locks, RN Outcome: Progressing 08/12/2023 1937 by Kallie Locks, RN Outcome: Progressing Goal: Will remain free from infection 08/12/2023 1938 by Kallie Locks, RN Outcome: Progressing 08/12/2023 1937 by Kallie Locks, RN Outcome: Progressing Goal: Diagnostic test results will improve 08/12/2023 1938 by Kallie Locks, RN Outcome: Progressing 08/12/2023 1937 by Kallie Locks, RN Outcome: Progressing

## 2023-08-12 NOTE — Consult Note (Signed)
Renal Service Consult Note [facility]  Ladamien Rammel 08/12/2023 [name], MD Requesting Physician: Dr. Mahala Menghini  Reason for Consult: ESRD pt w/ hallucinations HPI: The patient is a 53 y.o. year-old w/ PMH as below who presented to ED on 2/14 due to auditory and visual hallucinations at HD that day. Pt became agitated. There was concern for PRES syndrome, pt was severely hypertensive in ED. Neurology consulted. MRI was negative. Awaiting EEG. Also says he has been having intermittent hallucinations and delusions since his CABG in Jan 2025, has seen somebody trying to kill him in the house and seeing children in his house. His gabapentin and oxycodone 5mg  have been thus stopped. TSH and B12 were ok. We are asked to see for dialysis.    Pt seen in room. Flat affect but awakens easily and answers questions appropriately. No c/o at this time. No HD issues, access working okay.    PMH DM2 Dilated CM ESRD on HD MWF Anemia HTN Legally blind CAD sp CABG 06/28/23   ROS - denies CP, no joint pain, no HA, no blurry vision, no rash, no diarrhea, no nausea/ vomiting, no dysuria, no difficulty voiding   Past Medical History  Past Medical History:  Diagnosis Date   Allergy, unspecified, initial encounter 08/25/2019   Asthma    as a child   Cellulitis, perineum 11/26/2019   CHF (congestive heart failure) (HCC) 2021   Complication of vascular dialysis catheter 08/25/2019   COVID-19 virus infection 07/27/2019   Diabetes mellitus without complication (HCC)    type 2   Dilated cardiomyopathy (HCC) 07/03/2018   ESRD on hemodialysis (HCC) 06/17/2018   MWF at [facility]   Fe deficiency anemia 12/23/2018   Hypertension    Legally blind    B/L   Pneumonia 2021   Type 2 diabetes mellitus with diabetic peripheral angiopathy without gangrene (HCC) 08/25/2019   Unspecified protein-calorie malnutrition (HCC) 08/25/2019   Past Surgical History  Past Surgical History:  Procedure  Laterality Date   BASCILIC VEIN TRANSPOSITION Right 10/16/2019   Procedure: Basilic Vein Transposition Right Arm;  Surgeon: Nada Libman, MD;  Location: [facility] OR;  Service: Vascular;  Laterality: Right;   BASCILIC VEIN TRANSPOSITION Right 01/29/2020   Procedure: RIGHT ARM SECOND STAGE BASCILIC VEIN TRANSPOSITION;  Surgeon: Nada Libman, MD;  Location: [facility] OR;  Service: Vascular;  Laterality: Right;   BIOPSY  12/22/2018   Procedure: BIOPSY;  Surgeon: Carman Ching, MD;  Location: [facility] ENDOSCOPY;  Service: Endoscopy;;   CORONARY ARTERY BYPASS GRAFT N/A 06/28/2023   Procedure: CORONARY ARTERY BYPASS GRAFTING TIMES THREE USING LEFT INTERNAL MAMMARY ARTERY AND RIGHT GREAT SAPHENOUS VEIN HARVESTED ENDOSCOPICALLY;  Surgeon: Corliss Skains, MD;  Location: [facility] OR;  Service: Open Heart Surgery;  Laterality: N/A;   CORONARY ULTRASOUND/IVUS N/A 12/13/2022   Procedure: Coronary Ultrasound/IVUS;  Surgeon: Corky Crafts, MD;  Location: [facility] INVASIVE CV LAB;  Service: Cardiovascular;  Laterality: N/A;   ESOPHAGOGASTRODUODENOSCOPY N/A 12/22/2018   Procedure: ESOPHAGOGASTRODUODENOSCOPY (EGD);  Surgeon: Carman Ching, MD;  Location: [facility] ENDOSCOPY;  Service: Endoscopy;  Laterality: N/A;   IR FLUORO GUIDE CV LINE RIGHT  07/29/2019   IR US GUIDE VASC ACCESS RIGHT  07/29/2019   LEFT HEART CATH AND CORONARY ANGIOGRAPHY N/A 12/13/2022   Procedure: LEFT HEART CATH AND CORONARY ANGIOGRAPHY;  Surgeon: Corky Crafts, MD;  Location: [facility] INVASIVE CV LAB;  Service: Cardiovascular;  Laterality: N/A;   LEFT HEART CATH AND CORONARY ANGIOGRAPHY N/A 06/25/2023   Procedure:  LEFT HEART CATH AND CORONARY ANGIOGRAPHY;  Surgeon: Kathleene Hazel, MD;  Location: [facility] INVASIVE CV LAB;  Service: Cardiovascular;  Laterality: N/A;   TEE WITHOUT CARDIOVERSION N/A 06/28/2023   Procedure: TRANSESOPHAGEAL ECHOCARDIOGRAM (TEE);  Surgeon: Corliss Skains, MD;  Location: [facility] OR;  Service: Open Heart Surgery;  Laterality: N/A;    TRANSESOPHAGEAL ECHOCARDIOGRAM (CATH LAB) N/A 05/10/2023   Procedure: TRANSESOPHAGEAL ECHOCARDIOGRAM;  Surgeon: Sande Rives, MD;  Location: [facility] INVASIVE CV LAB;  Service: Cardiovascular;  Laterality: N/A;   Family History  Family History  Problem Relation Age of Onset   CAD Mother    Hypertension Mother    Diabetes Neg Hx    Stroke Neg Hx    Cancer Neg Hx    Kidney failure Neg Hx    Stomach cancer Neg Hx    Colon cancer Neg Hx    Rectal cancer Neg Hx    Social History  reports that he has never smoked. He has never used smokeless tobacco. He reports that he does not currently use alcohol. He reports that he does not currently use drugs after having used the following drugs: Marijuana. Allergies  Allergies  Allergen Reactions   Gabapentin     Hallucinations    Oxycodone     Hallucinations    Imdur [Isosorbide Nitrate] Other (See Comments)    Endorsed headache in the past - willing to retry   Home medications Prior to Admission medications   Medication Sig Start Date End Date Taking? Authorizing Provider  alum & mag hydroxide-simeth (MAALOX/MYLANTA) 200-200-20 MG/5ML suspension Take 30 mLs by mouth every 6 (six) hours as needed for indigestion or heartburn.   Yes [provider]  aspirin EC 81 MG tablet Take 1 tablet (81 mg total) by mouth daily. Swallow whole. 07/08/23  Yes Gold, Wayne E, PA-C  atorvastatin (LIPITOR) 80 MG tablet Take 1 tablet (80 mg total) by mouth daily. 12/15/22  Yes Modena Slater, DO  calcium acetate (PHOSLO) 667 MG capsule Take 3 capsules (2,001 mg total) by mouth with breakfast, with lunch, and with evening meal. 07/08/23  Yes Gold, Wayne E, PA-C  carvedilol (COREG) 12.5 MG tablet Take 1 tablet (12.5 mg total) by mouth 2 (two) times daily with a meal. 07/08/23  Yes Gold, Wayne E, PA-C  clopidogrel (PLAVIX) 75 MG tablet Take 1 tablet (75 mg total) by mouth daily. 07/08/23  Yes Gold, Wayne E, PA-C  famotidine (PEPCID) 20 MG tablet Take 20 mg by mouth  daily. 04/20/23  Yes [provider]  ferric citrate (AURYXIA) 1 GM 210 MG(Fe) tablet Take 2 tablets (420 mg total) by mouth daily. Patient taking differently: Take 420 mg by mouth 3 (three) times daily with meals. 12/14/22  Yes Modena Slater, DO  multivitamin (RENA-VIT) TABS tablet Take 1 tablet by mouth daily. 11/22/22  Yes [provider]  olmesartan (BENICAR) 20 MG tablet Take 1 tablet (20 mg total) by mouth daily. 12/14/22  Yes Modena Slater, DO  pantoprazole (PROTONIX) 20 MG tablet Take 20 mg by mouth daily as needed for heartburn. 04/19/23  Yes [provider]     Vitals:   08/12/23 0855 08/12/23 1259 08/12/23 1640 08/12/23 1938  BP: 134/72 (!) 159/80 138/81 138/75  Pulse: 78 83 78 77  Resp: 17 16 17 18   Temp: 98.4 F (36.9 C) 98.5 F (36.9 C) 99.1 F (37.3 C) 98.5 F (36.9 C)  TempSrc: Oral Oral Oral Oral  SpO2: 100% 94% 100% 98%  Weight:  Height:       Exam Gen alert, eyes closed, no distress No rash, cyanosis or gangrene Sclera anicteric, throat clear  No jvd or bruits Chest clear bilat to bases, no rales/ wheezing RRR no MRG Abd soft ntnd no mass or ascites +bs GU nl male MS no joint effusions or deformity Ext no LE or UE edema, no other edema Neuro is alert, Ox 3 , nf    RUA AVF+bruit       Renal-related home meds: - coreg 12.5 bid - phoslo 3 ac tid - auryxia 2 ac tid - renavite - olmesartan 150 every day - others: plavix, statin, asa, PPI     OP HD: MWF [facility]  4h  B450   93.9kg  2K bath  R AVF  Heparin none  Assessment/ Plan: Delirium - hx of hallucinations since his CABG in early Jan 2025. May be related to medications (oxycodone, gabapentin which have been stopped). Per pmd ESRD - on HD MWF. Partial HD Friday. Plan HD today.  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 Cont binders w/  meals.       Corey Moselle  MD [facility] 08/12/2023, 8:55 PM  Recent Labs  Lab 08/10/23 2047 08/11/23 1448 08/12/23 0748  HGB 8.9*  --  8.0*  ALBUMIN 3.5  --  2.7*  CALCIUM 9.0  --  8.8*  PHOS  --  6.4* 6.2*  CREATININE 9.23*  --  11.96*  K 3.4*  --  3.5   Inpatient medications:  aspirin EC  81 mg Oral Daily   atorvastatin  80 mg Oral Daily   carvedilol  12.5 mg Oral BID WC   clopidogrel  75 mg Oral Daily   ferric citrate  420 mg Oral TID WC   heparin  5,000 Units Subcutaneous Q8H   irbesartan  150 mg Oral Daily   multivitamin  1 tablet Oral Daily   trimethoprim-polymyxin b  1 drop Left Eye Q6H   Vitamin D (Ergocalciferol)  50,000 Units Oral Q7 days    acetaminophen **OR** acetaminophen, ondansetron **OR** ondansetron (ZOFRAN) IV, mouth rinse, senna-docusate

## 2023-08-12 NOTE — Plan of Care (Signed)

## 2023-08-12 NOTE — Plan of Care (Deleted)
Alert and oriented.  MRI completed today.  Irritable at start of shift but mood improved throughout the day.   Problem: Education: Goal: Knowledge of General Education information will improve Description: Including pain rating scale, medication(s)/side effects and non-pharmacologic comfort measures Outcome: Progressing   Problem: Health Behavior/Discharge Planning: Goal: Ability to manage health-related needs will improve Outcome: Progressing   Problem: Clinical Measurements: Goal: Ability to maintain clinical measurements within normal limits will improve Outcome: Progressing Goal: Will remain free from infection Outcome: Progressing Goal: Diagnostic test results will improve Outcome: Progressing

## 2023-08-13 DIAGNOSIS — G9340 Encephalopathy, unspecified: Secondary | ICD-10-CM | POA: Diagnosis not present

## 2023-08-13 LAB — CBC WITH DIFFERENTIAL/PLATELET
Abs Immature Granulocytes: 0.02 10*3/uL (ref 0.00–0.07)
Basophils Absolute: 0 10*3/uL (ref 0.0–0.1)
Basophils Relative: 0 %
Eosinophils Absolute: 0.3 10*3/uL (ref 0.0–0.5)
Eosinophils Relative: 5 %
HCT: 24.4 % — ABNORMAL LOW (ref 39.0–52.0)
Hemoglobin: 7.7 g/dL — ABNORMAL LOW (ref 13.0–17.0)
Immature Granulocytes: 0 %
Lymphocytes Relative: 23 %
Lymphs Abs: 1.5 10*3/uL (ref 0.7–4.0)
MCH: 29.4 pg (ref 26.0–34.0)
MCHC: 31.6 g/dL (ref 30.0–36.0)
MCV: 93.1 fL (ref 80.0–100.0)
Monocytes Absolute: 0.7 10*3/uL (ref 0.1–1.0)
Monocytes Relative: 12 %
Neutro Abs: 3.9 10*3/uL (ref 1.7–7.7)
Neutrophils Relative %: 60 %
Platelets: 216 10*3/uL (ref 150–400)
RBC: 2.62 MIL/uL — ABNORMAL LOW (ref 4.22–5.81)
RDW: 17.5 % — ABNORMAL HIGH (ref 11.5–15.5)
WBC: 6.5 10*3/uL (ref 4.0–10.5)
nRBC: 0 % (ref 0.0–0.2)

## 2023-08-13 LAB — PHOSPHORUS: Phosphorus: 5.7 mg/dL — ABNORMAL HIGH (ref 2.5–4.6)

## 2023-08-13 LAB — BASIC METABOLIC PANEL
Anion gap: 16 — ABNORMAL HIGH (ref 5–15)
BUN: 45 mg/dL — ABNORMAL HIGH (ref 6–20)
CO2: 28 mmol/L (ref 22–32)
Calcium: 9 mg/dL (ref 8.9–10.3)
Chloride: 93 mmol/L — ABNORMAL LOW (ref 98–111)
Creatinine, Ser: 13.31 mg/dL — ABNORMAL HIGH (ref 0.61–1.24)
GFR, Estimated: 4 mL/min — ABNORMAL LOW (ref >60)
Glucose, Bld: 112 mg/dL — ABNORMAL HIGH (ref 70–99)
Potassium: 3.5 mmol/L (ref 3.5–5.1)
Sodium: 137 mmol/L (ref 135–145)

## 2023-08-13 LAB — HEPATITIS B SURFACE ANTIGEN: Hepatitis B Surface Ag: NONREACTIVE

## 2023-08-13 MED ORDER — ALTEPLASE 2 MG IJ SOLR: 2.0000 mg | Freq: Once | INTRAMUSCULAR | Status: DC | PRN

## 2023-08-13 MED ORDER — ATORVASTATIN CALCIUM 80 MG PO TABS: 80.0000 mg | ORAL_TABLET | Freq: Every day | ORAL | 3 refills | Status: AC

## 2023-08-13 MED ORDER — LIDOCAINE HCL (PF) 1 % IJ SOLN: 5.0000 mL | INTRAMUSCULAR | Status: DC | PRN

## 2023-08-13 MED ORDER — HEPARIN SODIUM (PORCINE) 1000 UNIT/ML DIALYSIS: 1000.0000 [IU] | INTRAMUSCULAR | Status: DC | PRN

## 2023-08-13 MED ORDER — POLYMYXIN B-TRIMETHOPRIM 10000-0.1 UNIT/ML-% OP SOLN: 1.0000 [drp] | Freq: Four times a day (QID) | OPHTHALMIC | 0 refills | Status: DC

## 2023-08-13 MED ORDER — ANTICOAGULANT SODIUM CITRATE 4% (200MG/5ML) IV SOLN: 5.0000 mL | Status: DC | PRN

## 2023-08-13 MED ORDER — VITAMIN D (ERGOCALCIFEROL) 1.25 MG (50000 UNIT) PO CAPS: 50000.0000 [IU] | ORAL_CAPSULE | ORAL | 0 refills | Status: AC

## 2023-08-13 MED ORDER — LIDOCAINE-PRILOCAINE 2.5-2.5 % EX CREA: 1.0000 | TOPICAL_CREAM | CUTANEOUS | Status: DC | PRN

## 2023-08-13 MED ORDER — PENTAFLUOROPROP-TETRAFLUOROETH EX AERO: 1.0000 | INHALATION_SPRAY | CUTANEOUS | Status: DC | PRN

## 2023-08-13 MED ORDER — NEPRO/CARBSTEADY PO LIQD: 237.0000 mL | ORAL | Status: DC | PRN

## 2023-08-13 NOTE — Discharge Summary (Addendum)
Physician Discharge Summary  Corey Park QQV:[id number] DOB: 21-Jan-1971 DOA: 08/10/2023  PCP: [name], NP  Admit date: 08/10/2023 Discharge date: 08/13/2023  Time spent: 33 minutes  Recommendations for Outpatient Follow-up:  Hold opiates/gabapentin Need slabs per renal protocol and needs follow up of L eye  Discharge Diagnoses:  MAIN problem for hospitalization   Toxic encephalopathy from gabapentin and opiates  Please see below for itemized issues addressed in HOpsital- refer to other progress notes for clarity if needed  Discharge Condition: improved  Diet recommendation: renal dm  Filed Weights   08/10/23 2051  Weight: 97.5 kg    History of present illness:  53 year old black male-gets [facility] typically CAD with prior cath 11/2022 ---12/30-1/05/2024 admission for NSTEMI status post CABG X3 complicated by DKA DM TY 2 Previous concern for echodense mobile mass posterior mitral valve leaflet vegetation ruled out by TEE 05/10/2023 ESRD MWF HTN   2/14 brought to ED-missed dialysis on 2/12-during HD 2/14 auditory visual hallucinations did not finish full treatment became agitated-concern on admission for press syndrome/encephalopathy-neurology consulted-severely hypertensive in ED MRI brain EEG ordered Sodium 138 potassium 3.4 BUN/creatinine 27/9.2 alk phos 51 LFTs normal  WBC 6.9 hemoglobin 8.9 platelet 222 CXR low lung volumes with mild bibasilar atelectasis infiltrate EKG over read as being normal sinus rhythm PR interval 0.08 left anterior fascicular block ST depressions V1 through V3 with baseline V6 troponin 106 with flat trend peak 130--- BNP 212 Cardiologist was consulted and felt pattern not consistent with ACS CT head showed no acute infarction hemorrhage or hydrocephalus     Plan   Toxic metabolic encephalopathy on admission             MRI brain does not confirm press syndrome and has no acute  findings He has had intermittent hallucinations delusions since CABG have seen somebody trying to kill him and also seeing children in the house--delirium > than encephalopathy Opiates and gabapentin were used post CABG and were probably contributory and were stopped.  He is no longer delusional here nor is he having any hallucinations, just "bad dreams" EEG was neg, ruling out seizures follow B12 was 600--- TSH is negative at 2.744 Vitamin D is low we will supplement with ergocalciferol 50,000 units               ESRD MWF             Missed HD previously             Nephrology aware of patient and will consult for routine HD resume when taking p.o. usual PhosLo 3 tabs breakfast lunch dinner-resume Auryxia 2 tabs daily   NSTEMI with prior CABG X3 06/28/2023 Dilated cardiomyopathy--Echo 12/30 EF 40 to 45% Severe hypertension on admission Troponins were slightly elevated, EKG with some depressions, but troponins trend flat and have continued to remain that way--cardiology did not feel this required workup             Continue ASA 81, plavix 75, Coreg 12.5 twice daily atorvastatin 80, aVaPro 150 daily   Conjunctivitis Admitted with drainage from left eye additionally-placed on Polytrim 1 drop left eye every 6 and warm compresses on left eye   DM TY 2 A1c above 5.8.  At home does not appear to be on any therapy-would cover only if above 180  Discharge Exam: Vitals:   08/13/23 0805 08/13/23 1129  BP: (!) 162/83 (!) 163/90  Pulse: 77 75  Resp: 20 16  Temp: 99.3 F (37.4 C) 98.7 F (37.1 C)  SpO2: 96% 93%    Subj on day of d/c   Coherent awake alert in and L eye still a little red Chest clear no wheeze rales rhonchi Abd soft nt nd no rebound no guard Power 5/5  Discharge Instructions   Discharge Instructions     Diet - low sodium heart healthy   Complete by: As directed    Discharge instructions   Complete by: As directed    We felt that your confusion was related to the  use of gabapentin and oxycodone for post op pain-use only tylenol for pain going forward. I expect that you will fully recover Make sure that you follow for routine dialysis, do not miss sessions. Get labs as per protocol   Increase activity slowly   Complete by: As directed       Allergies as of 08/13/2023       Reactions   Gabapentin    Hallucinations    Oxycodone    Hallucinations    Imdur [isosorbide Nitrate] Other (See Comments)   Endorsed headache in the past - willing to retry        Medication List     STOP taking these medications    alum & mag hydroxide-simeth 200-200-20 MG/5ML suspension Commonly known as: MAALOX/MYLANTA       TAKE these medications    aspirin EC 81 MG tablet Take 1 tablet (81 mg total) by mouth daily. Swallow whole.   atorvastatin 80 MG tablet Commonly known as: LIPITOR Take 1 tablet (80 mg total) by mouth daily.   calcium acetate 667 MG capsule Commonly known as: PHOSLO Take 3 capsules (2,001 mg total) by mouth with breakfast, with lunch, and with evening meal.   carvedilol 12.5 MG tablet Commonly known as: COREG Take 1 tablet (12.5 mg total) by mouth 2 (two) times daily with a meal.   clopidogrel 75 MG tablet Commonly known as: Plavix Take 1 tablet (75 mg total) by mouth daily.   famotidine 20 MG tablet Commonly known as: PEPCID Take 20 mg by mouth daily.   ferric citrate 1 GM 210 MG(Fe) tablet Commonly known as: Auryxia Take 2 tablets (420 mg total) by mouth daily. What changed: when to take this   multivitamin Tabs tablet Take 1 tablet by mouth daily.   olmesartan 20 MG tablet Commonly known as: BENICAR Take 1 tablet (20 mg total) by mouth daily.   pantoprazole 20 MG tablet Commonly known as: PROTONIX Take 20 mg by mouth daily as needed for heartburn.   trimethoprim-polymyxin b ophthalmic solution Commonly known as: POLYTRIM Place 1 drop into the left eye every 6 (six) hours.   Vitamin D (Ergocalciferol)  1.25 MG (50000 UNIT) Caps capsule Commonly known as: DRISDOL Take 1 capsule (50,000 Units total) by mouth every 7 (seven) days. Start taking on: August 19, 2023       Allergies  Allergen Reactions   Gabapentin     Hallucinations    Oxycodone     Hallucinations    Imdur [Isosorbide Nitrate] Other (See Comments)    Endorsed headache in the past - willing to retry      The results of significant diagnostics from this hospitalization (including imaging, microbiology, ancillary and laboratory) are listed below for reference.    Significant Diagnostic Studies: MR BRAIN WO CONTRAST Result Date: 08/12/2023 CLINICAL DATA:  Delirium EXAM: MRI HEAD WITHOUT CONTRAST TECHNIQUE: Multiplanar, multiecho pulse sequences  of the brain and surrounding structures were obtained without intravenous contrast. COMPARISON:  Head CT from yesterday FINDINGS: Brain: No acute infarction, hemorrhage, hydrocephalus, extra-axial collection or mass lesion. T2 hyperintensity in the cerebral white matter and especially the pons attributed to chronic small vessel ischemia. Chronic microhemorrhages in the white matter and pons is presumably related to the same. Brain volume is normal. Vascular: Normal flow voids Skull and upper cervical spine: Dark marrow signal in the upper cervical spine likely from anemia or end-stage renal disease. No focal marrow lesion. Sinuses/Orbits: Subretinal collection in the left globe, patient is reportedly legally blind. IMPRESSION: 1. No acute finding. 2. Chronic small vessel disease. Electronically Signed   By: Tiburcio Pea M.D.   On: 08/12/2023 10:46   EEG adult Result Date: 08/11/2023 Jefferson Fuel, MD     08/11/2023  8:53 PM Routine EEG Report Oswin Griffith is a 53 y.o. male with a history of altered mental status who is undergoing an EEG to evaluate for seizures. Report: This EEG was acquired with electrodes placed according to the International 10-20 electrode system (including  Fp1, Fp2, F3, F4, C3, C4, P3, P4, O1, O2, T3, T4, T5, T6, A1, A2, Fz, Cz, Pz). The following electrodes were missing or displaced: none. The occipital dominant rhythm was 3-6 Hz with overriding beta frequencies. This activity is reactive to stimulation. Drowsiness was manifested by background fragmentation; deeper stages of sleep were identified by K complexes and sleep spindles. There was no focal slowing. There were no interictal epileptiform discharges. There were no electrographic seizures identified. There was no abnormal response to photic stimulation or hyperventilation. Impression and clinical correlation: This EEG was obtained while awake and asleep and is abnormal due to moderate-to-severe diffuse slowing indicative of global cerebral dysfunction. Epileptiform abnormalities were not seen during this recording. Bing Neighbors, MD [facility] [phone] If 7pm- 7am, please page neurology on call as listed in AMION.   CT Head Wo Contrast Result Date: 08/11/2023 CLINICAL DATA:  Altered mental status EXAM: CT HEAD WITHOUT CONTRAST TECHNIQUE: Contiguous axial images were obtained from the base of the skull through the vertex without intravenous contrast. RADIATION DOSE REDUCTION: This exam was performed according to the departmental dose-optimization program which includes automated exposure control, adjustment of the mA and/or kV according to patient size and/or use of iterative reconstruction technique. COMPARISON:  12/21/2018 FINDINGS: Brain: No evidence of acute infarction, hemorrhage, hydrocephalus, extra-axial collection or mass lesion/mass effect. Vascular: No hyperdense vessel or unexpected calcification. Skull: Normal. Negative for fracture or focal lesion. Sinuses/Orbits: No acute finding. Other: None. IMPRESSION: No acute intracranial abnormality noted. Electronically Signed   By: Alcide Clever M.D.   On: 08/11/2023 00:49   DG Chest Port 1 View Result Date: 08/10/2023 CLINICAL DATA:   Altered mental status. EXAM: PORTABLE CHEST 1 VIEW COMPARISON:  July 25, 2023 FINDINGS: Multiple sternal wires and vascular clips are seen. The cardiac silhouette is mildly enlarged and unchanged in size. Low lung volumes are noted. Mild, stable areas of atelectasis and/or infiltrate are seen within the bilateral lung bases. No pleural effusion or pneumothorax is identified. No acute osseous abnormalities are identified. IMPRESSION: 1. Evidence of prior median sternotomy/CABG. 2. Low lung volumes with mild bibasilar atelectasis and/or infiltrate. Electronically Signed   By: Aram Candela M.D.   On: 08/10/2023 21:36   CT Angio Chest Pulmonary Embolism (PE) W or WO Contrast Result Date: 07/26/2023 CLINICAL DATA:  Chest pain. EXAM: CT ANGIOGRAPHY CHEST WITH CONTRAST TECHNIQUE: Multidetector CT imaging  of the chest was performed using the standard protocol during bolus administration of intravenous contrast. Multiplanar CT image reconstructions and MIPs were obtained to evaluate the vascular anatomy. RADIATION DOSE REDUCTION: This exam was performed according to the departmental dose-optimization program which includes automated exposure control, adjustment of the mA and/or kV according to patient size and/or use of iterative reconstruction technique. CONTRAST:  75mL OMNIPAQUE IOHEXOL 350 MG/ML SOLN COMPARISON:  CT chest 06/01/2023. FINDINGS: Cardiovascular: Satisfactory opacification of the pulmonary arteries to the segmental level. No evidence of pulmonary embolism. Heart is enlarged. Patient is status post cardiac surgery. No pericardial effusion. Mediastinum/Nodes: There is a mildly enlarged right paratracheal lymph node measuring 1 cm. The visualized esophagus and thyroid gland are within normal limits. There is a small amount of low-density fluid in the anterior mediastinum superiorly image 5/52 measuring up to 15 mm in thickness. This is nonenhancing. Lungs/Pleura: There are bands of atelectasis in the  lingula and left lower lobe. There is trace left pleural effusion in the small amount of loculated pleural fluid in the left major fissure. There is no pneumothorax. Upper Abdomen: No acute abnormality. Musculoskeletal: New sternotomy wires are present. There some subcutaneous edema anterior to the sternotomy without definitive fluid collection or air. No acute fractures are seen. Review of the MIP images confirms the above findings. IMPRESSION: 1. No evidence for pulmonary embolism. 2. New sternotomy wires are present. There is a small amount of low-density fluid in the anterior mediastinum superiorly measuring up to 15 mm in thickness. This is nonenhancing and may represent postoperative seroma or hematoma. 3. Trace left pleural effusion with small amount of loculated pleural fluid in the left major fissure. 4. Bands of atelectasis in the lingula and left lower lobe. 5. Mildly enlarged right paratracheal lymph node, likely reactive. Electronically Signed   By: Darliss Cheney M.D.   On: 07/26/2023 01:46   DG Chest 2 View Result Date: 07/25/2023 CLINICAL DATA:  Chest pain EXAM: CHEST - 2 VIEW COMPARISON:  Chest x-ray 07/05/2023 FINDINGS: Heart is enlarged, sternotomy wires are present. There central pulmonary vascular congestion. There are patchy opacities in the left lung base. There is no pleural effusion or pneumothorax. No acute fractures are seen. IMPRESSION: 1. Cardiomegaly with central pulmonary vascular congestion. 2. Patchy opacities in the left lung base may represent atelectasis or infection. Electronically Signed   By: Darliss Cheney M.D.   On: 07/25/2023 22:19    Microbiology: No results found for this or any previous visit (from the past 240 hours).   Labs: Basic Metabolic Panel: Recent Labs  Lab 08/10/23 2047 08/11/23 1448 08/12/23 0748 08/13/23 0638  NA 138  --  138 137  K 3.4*  --  3.5 3.5  CL 94*  --  94* 93*  CO2 27  --  29 28  GLUCOSE 105*  --  141* 112*  BUN 27*  --  39* 45*   CREATININE 9.23*  --  11.96* 13.31*  CALCIUM 9.0  --  8.8* 9.0  MG  --  2.0  --   --   PHOS  --  6.4* 6.2* 5.7*   Liver Function Tests: Recent Labs  Lab 08/10/23 2047 08/12/23 0748  AST 22  --   ALT 10  --   ALKPHOS 51  --   BILITOT 0.9  --   PROT 8.4*  --   ALBUMIN 3.5 2.7*   Recent Labs  Lab 08/10/23 2047  LIPASE 37   No results for input(s): "  AMMONIA" in the last 168 hours. CBC: Recent Labs  Lab 08/10/23 2047 08/12/23 0748 08/13/23 0638  WBC 6.9 6.4 6.5  NEUTROABS 4.9 4.1 3.9  HGB 8.9* 8.0* 7.7*  HCT 28.1* 25.0* 24.4*  MCV 94.6 93.3 93.1  PLT 222 194 216   Cardiac Enzymes: No results for input(s): "CKTOTAL", "CKMB", "CKMBINDEX", "TROPONINI" in the last 168 hours. BNP: BNP (last 3 results) Recent Labs    06/25/23 0448 07/25/23 2330 08/11/23 1448  BNP 480.0* 1,545.2* 2,012.6*    ProBNP (last 3 results) No results for input(s): "PROBNP" in the last 8760 hours.  CBG: No results for input(s): "GLUCAP" in the last 168 hours.  Signed:  Rhetta Mura MD   [facility] 08/13/2023, 1:11 PM

## 2023-08-13 NOTE — TOC Transition Note (Signed)
Transition of Care [facility]) - Discharge Note   Patient Details  Name: Corey Park MRN: [id number] Date of Birth: [birth year]  [facility]) CM/SW Contact:  Kermit Balo, RN Phone Number: 08/13/2023, 1:38 PM   Clinical Narrative:     Pt is discharging home with self care.  CM has provided the bedside RN with cab voucher for transport home.  Final next level of care: Home/Self Care Barriers to Discharge: No Barriers Identified   Patient Goals and CMS Choice            Discharge Placement                       Discharge Plan and Services Additional resources added to the After Visit Summary for     Discharge Planning Services: CM Consult                                 Social Drivers of Health (SDOH) Interventions SDOH Screenings   Food Insecurity: Food Insecurity Present (08/11/2023)  Housing: Low Risk  (08/11/2023)  Transportation Needs: Unmet Transportation Needs (08/11/2023)  Utilities: Not At Risk (08/11/2023)  Depression (PHQ2-9): Low Risk  (08/10/2022)  Tobacco Use: Low Risk  (08/10/2023)     Readmission Risk Interventions    06/29/2023    1:27 PM 12/13/2022   10:39 AM  Readmission Risk Prevention Plan  Transportation Screening Complete Complete  PCP or Specialist Appt within 3-5 Days  Complete  HRI or Home Care Consult  Complete  Social Work Consult for Recovery Care Planning/Counseling  Complete  Palliative Care Screening  Complete  Medication Review Oceanographer) Referral to Pharmacy Referral to Pharmacy  HRI or Home Care Consult Complete   SW Recovery Care/Counseling Consult Complete   Palliative Care Screening Not Applicable

## 2023-08-13 NOTE — Progress Notes (Signed)
Received patient in bed to unit.  Alert and oriented.  Informed consent signed and in chart.   TX duration: 2 hours and 37 minutes.  Patient wanted to come off the machine early, signed AMA paperwork.  Patient tolerated well.  Transported back to the room  Alert, without acute distress.  Hand-off given to patient's nurse.   Access used: Right Upper Arm GRaft Access issues: none  Total UF removed: 2.9L Medication(s) given: none   08/13/23 1855  Vitals  Temp 98.5 F (36.9 C)  Temp Source Oral  BP (!) 181/88  BP Location Left Arm  BP Method Automatic  Patient Position (if appropriate) Lying  ECG Heart Rate 76  Resp 18  Oxygen Therapy  SpO2 100 %  O2 Device Room Air  During Treatment Monitoring  Duration of HD Treatment -hour(s) 2.61 hour(s)  HD Safety Checks Performed Yes  Intra-Hemodialysis Comments See progress note (Patient terminated session and signed AMA paperwork.)  Dialysis Fluid Bolus Normal Saline  Bolus Amount (mL) 300 mL  Post Treatment  Dialyzer Clearance Lightly streaked  Liters Processed 62.7  Fluid Removed (mL) 2900 mL  Tolerated HD Treatment Yes  AVG/AVF Arterial Site Held (minutes) 7 minutes  AVG/AVF Venous Site Held (minutes) 7 minutes  Fistula / Graft Right Upper arm Arteriovenous fistula  Placement Date/Time: 10/16/19 0833   Placed prior to admission: No  Orientation: Right  Access Location: Upper arm  Access Type: (c) Arteriovenous fistula  Status Deaccessed     Stacie Glaze LPN [facility]

## 2023-08-13 NOTE — TOC Initial Note (Signed)
Transition of Care [facility]) - Initial/Assessment Note    Patient Details  Name: Corey Park MRN: [id number] Date of Birth: 12-Sep-1970  [facility] CM/SW Contact:    Kermit Balo, RN Phone Number: 08/13/2023, 11:11 AM  Clinical Narrative:                  Pt is from home with his son. Pt states son is with him most of the time when he is at home.  Pt is ESRD. He uses transportation services to his HD sessions. Son manages his medications.  Pt states he will need cab home at d/c.  TOC following.  Expected Discharge Plan: Home/Self Care Barriers to Discharge: Continued Medical Work up   Patient Goals and CMS Choice            Expected Discharge Plan and Services   Discharge Planning Services: CM Consult   Living arrangements for the past 2 months: Apartment                                      Prior Living Arrangements/Services Living arrangements for the past 2 months: Apartment Lives with:: Adult Children Patient language and need for interpreter reviewed:: Yes Do you feel safe going back to the place where you live?: Yes        Care giver support system in place?: Yes (comment) Current home services: DME (oxygen/ walker) Criminal Activity/Legal Involvement Pertinent to Current Situation/Hospitalization: No - Comment as needed  Activities of Daily Living      Permission Sought/Granted                  Emotional Assessment Appearance:: Appears stated age Attitude/Demeanor/Rapport: Engaged Affect (typically observed): Guarded Orientation: : Oriented to Self, Oriented to Place, Oriented to  Time, Oriented to Situation   Psych Involvement: No (comment)  Admission diagnosis:  Hallucinations [R44.3] Elevated troponin [R79.89] End stage renal disease on dialysis (HCC) [N18.6, Z99.2] Acute encephalopathy [G93.40] Patient Active Problem List   Diagnosis Date Noted   Acute encephalopathy 08/11/2023   Hallucinations 08/11/2023    Bacterial conjunctivitis 08/11/2023   Hyperglycemia 06/29/2023   Pre-diabetes 06/29/2023   Blindness of both eyes 06/29/2023   S/P CABG x 3 06/28/2023   Hx of CABG 06/28/2023   NSTEMI (non-ST elevated myocardial infarction) (HCC) 06/25/2023   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   Obesity (BMI 30-39.9) 05/09/2023   Chest pain 05/09/2023   Acute hypoxic respiratory failure (HCC) 04/02/2023   Fluid overload 04/02/2023   Chest pain, atypical 01/03/2023   Anemia 12/12/2022   Iron deficiency anemia due to chronic blood loss 12/11/2022   Syncope and collapse 10/19/2022   Nonspecific chest pain 09/12/2022   Myocardial injury 08/06/2022   Non-cardiac chest pain 08/05/2022   (HFpEF) heart failure with preserved ejection fraction (HCC) 08/05/2022   Need for assistance at home without other household member able to render care 08/05/2022   Adult general medical exam 07/11/2022   Pneumonia 04/17/2022   Sepsis (HCC) 04/17/2022   Acute blood loss anemia 04/17/2022   Essential hypertension 04/17/2022   Thrombocytopenia (HCC) 04/17/2022   History of GI bleed 04/17/2022   Atypical chest pain 04/17/2022   GERD (gastroesophageal reflux disease) 04/17/2022   Nausea and vomiting 04/17/2022   Chronic hiccups 04/17/2022   Pain due to onychomycosis of toenails of both feet 10/11/2021   Hypercalcemia 04/13/2020   Coagulation defect,  unspecified (HCC) 08/25/2019   Secondary hyperparathyroidism of renal origin (HCC) 08/25/2019   History of anemia due to chronic kidney disease 12/23/2018   End stage renal disease on dialysis [facility])    Chronic systolic heart failure (HCC) 07/03/2018   Elevated troponin    Hypertensive urgency 12/11/2016   HLD (hyperlipidemia) 06/14/2007   DM2 (diabetes mellitus, type 2) (HCC) 06/11/2007   Hypertensive heart disease with CHF (congestive heart failure) (HCC) 06/11/2007   PCP:  [name], NP Pharmacy:   [id number] - [place], [place] - [address]  AT [facility] [address] [id number] Phone: [phone] Fax: [phone]     Social Drivers of Health (SDOH) Social History: SDOH Screenings   Food Insecurity: Food Insecurity Present (08/11/2023)  Housing: Low Risk  (08/11/2023)  Transportation Needs: Unmet Transportation Needs (08/11/2023)  Utilities: Not At Risk (08/11/2023)  Depression (PHQ2-9): Low Risk  (08/10/2022)  Tobacco Use: Low Risk  (08/10/2023)   SDOH Interventions:     Readmission Risk Interventions    06/29/2023    1:27 PM 12/13/2022   10:39 AM  Readmission Risk Prevention Plan  Transportation Screening Complete Complete  PCP or Specialist Appt within 3-5 Days  Complete  HRI or Home Care Consult  Complete  Social Work Consult for Recovery Care Planning/Counseling  Complete  Palliative Care Screening  Complete  Medication Review Oceanographer) Referral to Pharmacy Referral to Pharmacy  HRI or Home Care Consult Complete   SW Recovery Care/Counseling Consult Complete   Palliative Care Screening Not Applicable

## 2023-08-13 NOTE — Consult Note (Signed)
[facility] Liaison Consult Note   08/13/2023  Corey Park 1970-12-02 [id number]  Insurance:   Primary Care Provider: [name], NP, with [facility],  this provider is listed for the transition of care follow up appointments  and [facility] calls   [facility] Liaison met patient at bedside at [facility]. Patient is on the way to HD.    The patient was screened for hospitalization with noted extreme risk score for unplanned readmission risk 3 ED visits 4 hospital admissions in 6 months.  The patient was assessed for potential Community Care Coordination service needs for post hospital transition for care coordination. Review of patient's electronic medical record reveals patient was in the [facility] 30 day program however, [facility] RN had difficulty maintaining contact. Patient is likely discharged home after HD today, reviewed inpatient [facility] RN notes for cab voucher  Plan: [facility] Liaison will continue to follow progress and disposition to asess for post hospital community care coordination/management needs.  Referral request for community care coordination: Anticipate further [facility] outreach for post hospital transitional needs.   [facility] Community Care, Population Health does not replace or interfere with any arrangements made by the Inpatient Transition of Care team.   For questions contact:   Charlesetta Shanks, RN, BSN, CCM Page  [facility], [facility] Liaison Direct Dial: [phone] or secure chat Email: [email or web address]

## 2023-08-13 NOTE — Progress Notes (Signed)
Patient return to ward from Dialysis

## 2023-08-13 NOTE — Progress Notes (Signed)
Patient discharged. IV removed canula intact upon removal. Patient clothing changed. Patient exit the unit via wheelchair accompanied by nursing tech on duty.

## 2023-08-14 ENCOUNTER — Telehealth: Payer: Self-pay

## 2023-08-14 ENCOUNTER — Telehealth: Payer: Self-pay | Admitting: Nurse Practitioner

## 2023-08-14 LAB — HEPATITIS B SURFACE ANTIBODY, QUANTITATIVE: Hep B S AB Quant (Post): 406 m[IU]/mL

## 2023-08-14 NOTE — Transitions of Care (Post Inpatient/ED Visit) (Signed)
   08/14/2023  Name: Corey Park MRN: [id number] DOB: 07-21-1970  Today's TOC FU Call Status: Today's TOC FU Call Status:: Unsuccessful Call (1st Attempt) Unsuccessful Call (1st Attempt) Date: 08/14/23  Attempted to reach the patient regarding the most recent Inpatient/ED visit.  Follow Up Plan: Additional outreach attempts will be made to reach the patient to complete the Transitions of Care (Post Inpatient/ED visit) call.   Ricky Stabs, NP at [facility] is listed as his PCP but it appears that he has never seen her.   Signature  Robyne Peers, RN

## 2023-08-14 NOTE — Telephone Encounter (Signed)
Transition of care contact from inpatient facility  Date of Discharge: 08/13/2023 Date of Contact: 08/14/2023 Method of contact: Phone  Attempted to contact patient to discuss transition of care from inpatient admission. Patient did not answer the phone. Message was left on the patient's voicemail with call back number [phone].

## 2023-08-14 NOTE — Discharge Planning (Signed)
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 Please assess weight-suspect bed scale wt here  Bath Change: No  Access intervention/Change: No Details:  Hectorol/Calcitriol change: Yes decrease to 1 mcg PO three times per week Corrected calcium 10.0  Discharge Labs: Calcium 9.0 Phosphorus 5.7 Albumin 2.7 K+ 3.5  IV Antibiotics: NA Details:  On Coumadin?: NA Last INR: Next INR: Managed By:   OTHER/APPTS/LAB ORDERS:    D/C Meds to be reconciled by nurse after every discharge.  Completed By: Alonna Buckler [facility] [phone]    Reviewed by: MD:______ RN_______

## 2023-08-15 ENCOUNTER — Telehealth: Payer: Self-pay

## 2023-08-15 NOTE — Transitions of Care (Post Inpatient/ED Visit) (Signed)
   08/15/2023  Name: Germaine Shenker MRN: [id number] DOB: 06-11-1971  Today's TOC FU Call Status: Today's TOC FU Call Status:: Unsuccessful Call (2nd Attempt) Unsuccessful Call (2nd Attempt) Date: 08/15/23  Attempted to reach the patient regarding the most recent Inpatient/ED visit.  Follow Up Plan: Additional outreach attempts will be made to reach the patient to complete the Transitions of Care (Post Inpatient/ED visit) call.   Tashonda Pinkus A. Mliss Fritz RN, BA, [facility], CRRN [facility] RN Care Manager, Transition of Care [phone]

## 2023-08-15 NOTE — Transitions of Care (Post Inpatient/ED Visit) (Signed)
   08/15/2023  Name: Corey Park MRN: [id number] DOB: 27-Sep-1970  Today's TOC FU Call Status: Today's TOC FU Call Status:: Unsuccessful Call (2nd Attempt) Unsuccessful Call (1st Attempt) Date: 08/14/23 Unsuccessful Call (2nd Attempt) Date: 08/15/23  Attempted to reach the patient regarding the most recent Inpatient/ED visit.  Follow Up Plan: Additional outreach attempts will be made to reach the patient to complete the Transitions of Care (Post Inpatient/ED visit) call.   Signature Robyne Peers, RN

## 2023-08-16 ENCOUNTER — Telehealth: Payer: Self-pay

## 2023-08-16 NOTE — Transitions of Care (Post Inpatient/ED Visit) (Signed)
   08/16/2023  Name: Corey Park MRN: [id number] DOB: [birth year]  Today's TOC FU Call Status: Today's TOC FU Call Status:: Unsuccessful Call (3rd Attempt) Unsuccessful Call (3rd Attempt) Date: 08/16/23  Attempted to reach the patient regarding the most recent Inpatient/ED visit.  Follow Up Plan: No further outreach attempts will be made at this time. We have been unable to contact the patient.  Hicks Feick A. Mliss Fritz RN, BA, [facility], CRRN [facility] RN Care Manager, Transition of Care [phone]

## 2023-08-17 ENCOUNTER — Emergency Department (HOSPITAL_COMMUNITY)
Admission: EM | Admit: 2023-08-17 | Discharge: 2023-08-17 | Disposition: A | Discharge status: Home / Self Care | Payer: Medicare Other | Attending: Emergency Medicine | Admitting: Emergency Medicine

## 2023-08-17 ENCOUNTER — Emergency Department (HOSPITAL_COMMUNITY): Payer: Medicare Other

## 2023-08-17 ENCOUNTER — Other Ambulatory Visit: Payer: Self-pay

## 2023-08-17 ENCOUNTER — Encounter (HOSPITAL_COMMUNITY): Payer: Self-pay | Admitting: Emergency Medicine

## 2023-08-17 DIAGNOSIS — Z79899 Other long term (current) drug therapy: Secondary | ICD-10-CM | POA: Insufficient documentation

## 2023-08-17 DIAGNOSIS — Z7982 Long term (current) use of aspirin: Secondary | ICD-10-CM | POA: Insufficient documentation

## 2023-08-17 DIAGNOSIS — E876 Hypokalemia: Secondary | ICD-10-CM | POA: Insufficient documentation

## 2023-08-17 DIAGNOSIS — N186 End stage renal disease: Secondary | ICD-10-CM | POA: Insufficient documentation

## 2023-08-17 DIAGNOSIS — I509 Heart failure, unspecified: Secondary | ICD-10-CM | POA: Insufficient documentation

## 2023-08-17 DIAGNOSIS — Z992 Dependence on renal dialysis: Secondary | ICD-10-CM | POA: Insufficient documentation

## 2023-08-17 DIAGNOSIS — I132 Hypertensive heart and chronic kidney disease with heart failure and with stage 5 chronic kidney disease, or end stage renal disease: Secondary | ICD-10-CM | POA: Insufficient documentation

## 2023-08-17 DIAGNOSIS — Z955 Presence of coronary angioplasty implant and graft: Secondary | ICD-10-CM | POA: Insufficient documentation

## 2023-08-17 DIAGNOSIS — I10 Essential (primary) hypertension: Secondary | ICD-10-CM

## 2023-08-17 DIAGNOSIS — E1122 Type 2 diabetes mellitus with diabetic chronic kidney disease: Secondary | ICD-10-CM | POA: Insufficient documentation

## 2023-08-17 DIAGNOSIS — R519 Headache, unspecified: Secondary | ICD-10-CM | POA: Diagnosis present

## 2023-08-17 LAB — TROPONIN I (HIGH SENSITIVITY)
Troponin I (High Sensitivity): 90 ng/L — ABNORMAL HIGH (ref <18)
Troponin I (High Sensitivity): 83 ng/L — ABNORMAL HIGH (ref <18)

## 2023-08-17 LAB — BASIC METABOLIC PANEL
Anion gap: 15 (ref 5–15)
BUN: 18 mg/dL (ref 6–20)
CO2: 31 mmol/L (ref 22–32)
Calcium: 9 mg/dL (ref 8.9–10.3)
Chloride: 92 mmol/L — ABNORMAL LOW (ref 98–111)
Creatinine, Ser: 7.59 mg/dL — ABNORMAL HIGH (ref 0.61–1.24)
GFR, Estimated: 8 mL/min — ABNORMAL LOW (ref >60)
Glucose, Bld: 104 mg/dL — ABNORMAL HIGH (ref 70–99)
Potassium: 3 mmol/L — ABNORMAL LOW (ref 3.5–5.1)
Sodium: 138 mmol/L (ref 135–145)

## 2023-08-17 LAB — CBC
HCT: 30.9 % — ABNORMAL LOW (ref 39.0–52.0)
Hemoglobin: 9.8 g/dL — ABNORMAL LOW (ref 13.0–17.0)
MCH: 30 pg (ref 26.0–34.0)
MCHC: 31.7 g/dL (ref 30.0–36.0)
MCV: 94.5 fL (ref 80.0–100.0)
Platelets: 277 10*3/uL (ref 150–400)
RBC: 3.27 MIL/uL — ABNORMAL LOW (ref 4.22–5.81)
RDW: 18.2 % — ABNORMAL HIGH (ref 11.5–15.5)
WBC: 6.1 10*3/uL (ref 4.0–10.5)
nRBC: 0.3 % — ABNORMAL HIGH (ref 0.0–0.2)

## 2023-08-17 LAB — RESP PANEL BY RT-PCR (RSV, FLU A&B, COVID)  RVPGX2
Influenza A by PCR: NEGATIVE
Influenza B by PCR: NEGATIVE
Resp Syncytial Virus by PCR: NEGATIVE
SARS Coronavirus 2 by RT PCR: NEGATIVE

## 2023-08-17 MED ORDER — TETRACAINE HCL 0.5 % OP SOLN
2.0000 [drp] | Freq: Once | OPHTHALMIC | Status: AC
  Administered 2023-08-17: 2 [drp] via OPHTHALMIC
  Filled 2023-08-17: qty 4

## 2023-08-17 MED ORDER — FLUORESCEIN SODIUM 1 MG OP STRP
1.0000 | ORAL_STRIP | Freq: Once | OPHTHALMIC | Status: AC
  Administered 2023-08-17: 1 via OPHTHALMIC
  Filled 2023-08-17: qty 1

## 2023-08-17 MED ORDER — CARVEDILOL 12.5 MG PO TABS
12.5000 mg | ORAL_TABLET | Freq: Two times a day (BID) | ORAL | Status: DC
  Administered 2023-08-17: 12.5 mg via ORAL
  Filled 2023-08-17: qty 1

## 2023-08-17 MED ORDER — CARVEDILOL 12.5 MG PO TABS: 12.5000 mg | ORAL_TABLET | Freq: Once | ORAL | Status: DC

## 2023-08-17 MED ORDER — ACETAMINOPHEN 500 MG PO TABS
1000.0000 mg | ORAL_TABLET | Freq: Once | ORAL | Status: AC
  Administered 2023-08-17: 1000 mg via ORAL
  Filled 2023-08-17: qty 2

## 2023-08-17 NOTE — Discharge Instructions (Signed)
Please follow-up with your primary care provider in regards to recent ER visit.  Today your labs and imaging are reassuring and I recommend that you take your carvedilol as prescribed along with the Polytrim for your eye.  If symptoms change or worsen please return to the ER.

## 2023-08-17 NOTE — ED Triage Notes (Signed)
Patient from home via EMS after dialysis treatment. Having  R sided chest pain since having CABG x 3 in January. Also reports DOE. Hypertensive 260 systolic today and was given 1 nitroglycerin by EMS with some improvement as well as 324 ASA. Missed Wednesday's dialysis but completed today's treatment.

## 2023-08-17 NOTE — ED Provider Notes (Signed)
 [place] EMERGENCY DEPARTMENT AT [facility] Provider Note   CSN: [id number] Arrival date & time: 08/17/23  1540     History  Chief Complaint  Patient presents with   Chest Pain    Corey Park is a 53 y.o. male history of ESRD on dialysis, CHF, hypertensive urgency, type 2 diabetes, NSTEMI, CABG x 3 presented for chest pain that began after dialysis.  Patient was noted to be hypertensive at 260 systolic which is up from his baseline of 220 at home and was given a nitro with his blood pressure dropping to 190 systolic and states he feels slightly better after the nitro.  Patient states the chest pain is across his chest but denies any shortness of breath.  Patient states that with the chest pain he was having headache as well but denies any new weakness or paresthesias.  Patient is legally blind but states he feels burning sensation in his left eye and cannot say whether or not he has had any discharge.  Patient denies any fevers.   Home Medications Prior to Admission medications   Medication Sig Start Date End Date Taking? Authorizing Provider  aspirin EC 81 MG tablet Take 1 tablet (81 mg total) by mouth daily. Swallow whole. 07/08/23   Gold, Wayne E, PA-C  atorvastatin (LIPITOR) 80 MG tablet Take 1 tablet (80 mg total) by mouth daily. 08/13/23   Rhetta Mura, MD  calcium acetate (PHOSLO) 667 MG capsule Take 3 capsules (2,001 mg total) by mouth with breakfast, with lunch, and with evening meal. 07/08/23   Gold, Glenice Laine, PA-C  carvedilol (COREG) 12.5 MG tablet Take 1 tablet (12.5 mg total) by mouth 2 (two) times daily with a meal. 07/08/23   Gold, Glenice Laine, PA-C  clopidogrel (PLAVIX) 75 MG tablet Take 1 tablet (75 mg total) by mouth daily. 07/08/23   Gold, Wayne E, PA-C  famotidine (PEPCID) 20 MG tablet Take 20 mg by mouth daily. 04/20/23   [provider]  ferric citrate (AURYXIA) 1 GM 210 MG(Fe) tablet Take 2 tablets (420 mg total) by mouth daily. Patient  taking differently: Take 420 mg by mouth 3 (three) times daily with meals. 12/14/22   Modena Slater, DO  multivitamin (RENA-VIT) TABS tablet Take 1 tablet by mouth daily. 11/22/22   [provider]  olmesartan (BENICAR) 20 MG tablet Take 1 tablet (20 mg total) by mouth daily. 12/14/22   Modena Slater, DO  pantoprazole (PROTONIX) 20 MG tablet Take 20 mg by mouth daily as needed for heartburn. 04/19/23   [provider]  trimethoprim-polymyxin b (POLYTRIM) ophthalmic solution Place 1 drop into the left eye every 6 (six) hours. 08/13/23   Rhetta Mura, MD  Vitamin D, Ergocalciferol, (DRISDOL) 1.25 MG (50000 UNIT) CAPS capsule Take 1 capsule (50,000 Units total) by mouth every 7 (seven) days. 08/19/23   Rhetta Mura, MD      Allergies    Gabapentin, Oxycodone, and Imdur [isosorbide nitrate]    Review of Systems   Review of Systems  Cardiovascular:  Positive for chest pain.    Physical Exam Updated Vital Signs BP (!) 199/97   Pulse 76   Temp 98.4 F (36.9 C) (Oral)   Resp 18   Ht 6' (1.829 m)   Wt 88 kg   SpO2 100%   BMI 26.31 kg/m  Physical Exam Vitals reviewed.  Constitutional:      General: He is not in acute distress. HENT:     Head: Normocephalic and  atraumatic.  Eyes:     General:        Left eye: No discharge.     Extraocular Movements: Extraocular movements intact.     Conjunctiva/sclera:     Left eye: Left conjunctiva is injected.     Pupils: Pupils are equal, round, and reactive to light.     Left eye: No corneal abrasion or fluorescein uptake. Seidel exam negative.    Comments: Blind out of both eyes No obvious discharge Right conjunctiva injected  Cardiovascular:     Rate and Rhythm: Normal rate and regular rhythm.     Pulses: Normal pulses.     Heart sounds: Normal heart sounds.     Comments: 2+ bilateral radial/dorsalis pedis pulses with regular rate Pulmonary:     Effort: Pulmonary effort is normal. No respiratory distress.      Breath sounds: Normal breath sounds.  Abdominal:     Palpations: Abdomen is soft.     Tenderness: There is no abdominal tenderness. There is no guarding or rebound.  Musculoskeletal:        General: Normal range of motion.     Cervical back: Normal range of motion and neck supple.     Comments: 5 out of 5 bilateral grip/leg extension strength  Skin:    General: Skin is warm and dry.     Capillary Refill: Capillary refill takes less than 2 seconds.  Neurological:     General: No focal deficit present.     Mental Status: He is alert and oriented to person, place, and time.     Comments: Sensation intact in all 4 limbs  Psychiatric:        Mood and Affect: Mood normal.     ED Results / Procedures / Treatments   Labs (all labs ordered are listed, but only abnormal results are displayed) Labs Reviewed  BASIC METABOLIC PANEL - Abnormal; Notable for the following components:      Result Value   Potassium 3.0 (*)    Chloride 92 (*)    Glucose, Bld 104 (*)    Creatinine, Ser 7.59 (*)    GFR, Estimated 8 (*)    All other components within normal limits  CBC - Abnormal; Notable for the following components:   RBC 3.27 (*)    Hemoglobin 9.8 (*)    HCT 30.9 (*)    RDW 18.2 (*)    nRBC 0.3 (*)    All other components within normal limits  TROPONIN I (HIGH SENSITIVITY) - Abnormal; Notable for the following components:   Troponin I (High Sensitivity) 90 (*)    All other components within normal limits  TROPONIN I (HIGH SENSITIVITY) - Abnormal; Notable for the following components:   Troponin I (High Sensitivity) 83 (*)    All other components within normal limits  RESP PANEL BY RT-PCR (RSV, FLU A&B, COVID)  RVPGX2  BRAIN NATRIURETIC PEPTIDE    EKG EKG Interpretation Date/Time:  Friday August 17 2023 15:54:39 EST Ventricular Rate:  78 PR Interval:  184 QRS Duration:  120 QT Interval:  464 QTC Calculation: 529 R Axis:   -28  Text Interpretation: Sinus rhythm Consider left  atrial enlargement IVCD, consider atypical RBBB LVH with secondary repolarization abnormality Anterior ST elevation, probably due to LVH Prolonged QT interval Confirmed by Jacalyn Lefevre [phone]) on 08/17/2023 4:29:26 PM  Radiology DG Chest Port 1 View Result Date: 08/17/2023 CLINICAL DATA:  Right-sided chest pain EXAM: PORTABLE CHEST 1 VIEW COMPARISON:  Chest x-ray  08/10/2023 FINDINGS: The heart is enlarged. There is no focal lung infiltrate, pleural effusion or pneumothorax. Sternotomy wires are present. No acute fractures are seen. IMPRESSION: Cardiomegaly. No active disease. Electronically Signed   By: Darliss Cheney M.D.   On: 08/17/2023 17:28   CT Head Wo Contrast Result Date: 08/17/2023 CLINICAL DATA:  HA HTN EXAM: CT HEAD WITHOUT CONTRAST TECHNIQUE: Contiguous axial images were obtained from the base of the skull through the vertex without intravenous contrast. RADIATION DOSE REDUCTION: This exam was performed according to the departmental dose-optimization program which includes automated exposure control, adjustment of the mA and/or kV according to patient size and/or use of iterative reconstruction technique. COMPARISON:  CT head August 11, 2023. FINDINGS: Brain: No evidence of acute infarction, hemorrhage, hydrocephalus, extra-axial collection or mass lesion/mass effect. Vascular: No hyperdense vessel.  Calcific atherosclerosis. Skull: No acute fracture. Sinuses/Orbits: Paranasal sinus mucosal thickening involving the posterior ethmoid air cells. Otherwise, sinuses are clear. No acute orbital findings. Other: No mastoid effusions. IMPRESSION: No evidence of acute intracranial abnormality. Electronically Signed   By: Feliberto Harts M.D.   On: 08/17/2023 17:17    Procedures Procedures    Medications Ordered in ED Medications  carvedilol (COREG) tablet 12.5 mg (has no administration in time range)  acetaminophen (TYLENOL) tablet 1,000 mg (has no administration in time range)   fluorescein ophthalmic strip 1 strip (1 strip Left Eye Given by Other 08/17/23 1746)  tetracaine (PONTOCAINE) 0.5 % ophthalmic solution 2 drop (2 drops Left Eye Given by Other 08/17/23 1746)    ED Course/ Medical Decision Making/ A&P                                 Medical Decision Making Amount and/or Complexity of Data Reviewed Labs: ordered. Radiology: ordered.  Risk OTC drugs. Prescription drug management.   Corey Park 53 y.o. presented today for chest pain, hypertension, left eye burning, headache. Working DDx that I considered at this time includes, but not limited to, uncontrolled hypertension, ACS, heart failure, aortic dissection, conjunctivitis, corneal ulcer, tension headache, headache secondary to his hypertension, stroke, epidural/subdural hematoma.  R/o DDx: ACS, heart failure, aortic dissection, conjunctivitis, corneal ulcer, tension headache, headache secondary to his hypertension, stroke, epidural/subdural hematoma: These are considered less likely due to history of present illness, physical exam, labs/imaging findings  Review of prior external notes: 08/13/2023 discharge summary  Unique Tests and My Independent Interpretation:  Fluorescein stain: No reuptake Chest x-ray: No acute findings CTA without contrast: No acute findings CBC: Unremarkable Respiratory panel: Negative Troponin: 90, 83 BMP: Hypokalemia 3.0 BNP: Pending EKG: Sinus 78 bpm, prolonged QT, LVH; similar to previous  Social Determinants of Health: homeless  Discussion with Independent Historian: None  Discussion of Management of Tests: None  Risk: Medium: prescription drug management  Risk Stratification Score: none  Staffed with [place], MD  Plan: On exam patient was in no acute distress with reasonable vital signs.  Patient was hypertensive at 188 systolic upon arrival after receiving nitro from EMS.  Patient reportedly had systolic of 260.  Patient states that he he has been  taking his blood pressure meds including the carvedilol.  Patient was just admitted for severe hypertension and discharged on the 17th and states the chest pain is been consistent since then.  Patient also came in with left eye pain which is also something that was addressed during his admission which she was prescribed Polytrim and states  he has been taking.  Will repeat fluorescein stain.  Will obtain labs and imaging.  Upon recheck patient is resting comfortably.  Fluorescein stain was applied and shows no uptake.  Patient states he still has the Polytrim that was prescribed to him a few days ago and has been using it so encouraged he keeps using it.  Patient given his carvedilol.  On recheck patient's blood pressure was 191 systolic and patient was resting comfortably/ Delta Troponin was negative.  Patient normally has elevated troponins however this is less than normal for him.  Patient was mildly hypokalemic however given his ESRD status will not replenish to avoid making him hyper kalemia.  At this time patient is stable to be discharged with outpatient follow-up.  Strongly recommended patient takes his medications as prescribed as I suspect his symptoms are from his uncontrolled hypertension.  Patient was given return precautions. Patient stable for discharge at this time.  Patient verbalized understanding of plan.  This chart was dictated using voice recognition software.  Despite best efforts to proofread,  errors can occur which can change the documentation meaning.         Final Clinical Impression(s) / ED Diagnoses Final diagnoses:  Uncontrolled hypertension    Rx / DC Orders ED Discharge Orders     None         Remi Deter 08/17/23 1949    Tegeler, Canary Brim, MD 08/18/23 [phone]

## 2023-08-21 ENCOUNTER — Other Ambulatory Visit: Payer: Self-pay | Admitting: Thoracic Surgery (Cardiothoracic Vascular Surgery)

## 2023-08-21 ENCOUNTER — Telehealth: Payer: Self-pay

## 2023-08-21 DIAGNOSIS — Z951 Presence of aortocoronary bypass graft: Secondary | ICD-10-CM

## 2023-08-21 NOTE — Transitions of Care (Post Inpatient/ED Visit) (Signed)
 08/21/2023  Name: Corey Park MRN: [id number] DOB: April 26, 1971  Today's TOC FU Call Status: Today's TOC FU Call Status:: Successful TOC FU Call Completed Patient's Name and Date of Birth confirmed.  Transition Care Management Follow-up Telephone Call Date of Discharge: 08/17/23 [facility] Type of Discharge: Emergency Department Reason for ED Visit: Other: How have you been since you were released from the hospital?: Better Any questions or concerns?: No  Items Reviewed: Did you receive and understand the discharge instructions provided?: Yes Medications obtained,verified, and reconciled?: Yes (Medications Reviewed) Any new allergies since your discharge?: No Dietary orders reviewed?: NA Do you have support at home?: Yes People in Home: child(ren), adult Name of Support/Comfort Primary Source: son  Medications Reviewed Today: Medications Reviewed Today     Reviewed by [place] Constable, LPN (Licensed Practical Nurse) on 08/21/23 at 1028  Med List Status: <None>   Medication Order Taking? Sig Documenting Provider Last Dose Status Informant  aspirin EC 81 MG tablet [id number] Yes Take 1 tablet (81 mg total) by mouth daily. Swallow whole. Rowe Clack, PA-C Taking Active Self  atorvastatin (LIPITOR) 80 MG tablet [id number] Yes Take 1 tablet (80 mg total) by mouth daily. Rhetta Mura, MD Taking Active   calcium acetate (PHOSLO) 667 MG capsule [id number] Yes Take 3 capsules (2,001 mg total) by mouth with breakfast, with lunch, and with evening meal. Rowe Clack, PA-C Taking Active Self  carvedilol (COREG) 12.5 MG tablet [id number] Yes Take 1 tablet (12.5 mg total) by mouth 2 (two) times daily with a meal. Rowe Clack, PA-C Taking Active Self  clopidogrel (PLAVIX) 75 MG tablet [id number] Yes Take 1 tablet (75 mg total) by mouth daily. Rowe Clack, PA-C Taking Active Self  famotidine (PEPCID) 20 MG tablet [id number] Yes Take 20 mg by mouth daily.  [provider] Taking Active Self  ferric citrate (AURYXIA) 1 GM 210 MG(Fe) tablet [id number] Yes Take 2 tablets (420 mg total) by mouth daily.  Patient taking differently: Take 420 mg by mouth 3 (three) times daily with meals.   Modena Slater, DO Taking Active Self           Med Note Georgeanna Lea Jun 25, 2023  3:04 PM) Patient thinks they are only taking this at dialysis, but it has been consistently filled for 90d/s which indicate daily use at home as prescribed.  multivitamin (RENA-VIT) TABS tablet [id number] Yes Take 1 tablet by mouth daily. [provider] Taking Active Self  olmesartan (BENICAR) 20 MG tablet [id number] Yes Take 1 tablet (20 mg total) by mouth daily. Modena Slater, DO Taking Active Self  pantoprazole (PROTONIX) 20 MG tablet [id number] Yes Take 20 mg by mouth daily as needed for heartburn. [provider] Taking Active Self  trimethoprim-polymyxin b (POLYTRIM) ophthalmic solution [id number] Yes Place 1 drop into the left eye every 6 (six) hours. Rhetta Mura, MD Taking Active   Vitamin D, Ergocalciferol, (DRISDOL) 1.25 MG (50000 UNIT) CAPS capsule [id number] Yes Take 1 capsule (50,000 Units total) by mouth every 7 (seven) days. Rhetta Mura, MD Taking Active   Med List Note (Card, Bryson Ha, CPhT 04/02/23 1515): Dialysis Monday, Wednesday, and Friday.            Home Care and Equipment/Supplies: Were Home Health Services Ordered?: NA Any new equipment or medical supplies ordered?: NA  Functional Questionnaire: Do you need assistance with bathing/showering or dressing?: No Do you need assistance with meal preparation?: No Do  you need assistance with eating?: No Do you have difficulty maintaining continence: No Do you need assistance with getting out of bed/getting out of a chair/moving?: No Do you have difficulty managing or taking your medications?: No  Follow up appointments reviewed: PCP Follow-up appointment confirmed?:  Yes Date of PCP follow-up appointment?: 08/23/23 Follow-up Provider: Ricky Stabs, NP Specialist Hospital Follow-up appointment confirmed?: Yes Date of Specialist follow-up appointment?: 08/22/23 Follow-Up Specialty Provider:: [facility] Do you need transportation to your follow-up appointment?: No Do you understand care options if your condition(s) worsen?: Yes-patient verbalized understanding    SIGNATURE Eldena Dede, LPN [facility] AWV Team Direct Dial: [phone]

## 2023-08-21 NOTE — Telephone Encounter (Signed)
 Schedule appointment?

## 2023-08-22 ENCOUNTER — Ambulatory Visit (INDEPENDENT_AMBULATORY_CARE_PROVIDER_SITE_OTHER): Payer: Self-pay

## 2023-08-22 ENCOUNTER — Encounter: Payer: Self-pay | Admitting: Physician Assistant

## 2023-08-22 VITALS — BP 173/92 | HR 77 | Resp 18 | Ht 72.0 in | Wt 202.0 lb

## 2023-08-22 DIAGNOSIS — Z951 Presence of aortocoronary bypass graft: Secondary | ICD-10-CM

## 2023-08-22 MED ORDER — OLMESARTAN MEDOXOMIL 40 MG PO TABS: 40.0000 mg | ORAL_TABLET | Freq: Every day | ORAL | 1 refills | Status: DC

## 2023-08-22 NOTE — Patient Instructions (Addendum)
 1. You are encouraged to enroll and participate in the outpatient cardiac rehab program beginning as soon as practical. 2. Continue to avoid any heavy lifting or strenuous use of your arms or shoulders for at least a total of two  months from the time of surgery.  After two months ,you may gradually increase how much you lift or otherwise use your arms or chest as tolerated, with limits based upon whether or not activities lead to the return of significant discomfort.  Prediabetes: Eating Plan Please note, must also be compliant with renal diet along with following suggestions. Prediabetes is when your levels of blood sugar, also called glucose, are higher than normal. This can put you at risk for getting type 2 diabetes. When you have prediabetes, making healthy changes can help keep you from getting diabetes. This includes changes in your diet. Work with your health care provider or an expert in healthy eating called a dietitian. They can help you create a healthy eating plan. This plan can help you: Control your blood sugar levels. Improve your cholesterol levels. Manage your blood pressure. What are tips for following this plan? Reading food labels Read food labels to check the amount of fat and sugar in prepackaged foods. Avoid foods that have: Saturated fats. Trans fats. Added sugars. Check food labels for the amount of salt (sodium). Avoid foods that have more than 300 milligrams (mg) of salt per serving. Limit your salt intake to less than 2,300 mg each day. Shopping Avoid buying pre-made and processed foods. Avoid buying drinks with added sugar. Cooking Cook with olive oil. Do not use: Butter. Lard. Ghee. Bake, broil, grill, steam, or boil foods. Avoid frying. Meal planning  Work with your dietitian to create an eating plan that's right for you. This may include tracking how many calories you take in each day. Use a food diary, notebook, or mobile app to track what you eat at  each meal. Consider following a Mediterranean diet. This includes: Eating many servings of fresh fruits and vegetables each day. Eating fish at least twice a week. Eating one serving each day of whole grains, beans, nuts, and seeds. Using olive oil instead of other fats. Limiting alcohol. Limiting red meat. Using nonfat or low-fat dairy products. Consider following a plant-based diet. This means eating mostly: Vegetables and fruit. Grains. Beans. Nuts and seeds. If you have high blood pressure, you may need to limit your salt intake or follow a diet called the DASH eating plan. The DASH eating plan can help lower high blood pressure. Lifestyle Set weight loss goals with help from your health care team. Losing 7% of your body weight is a good goal for most people with prediabetes. Exercise for at least 30 minutes, 5 or more days a week. For support, think about joining a support group or talking with a mental health counselor. Take medicines only as told. What foods are recommended? Fruits Berries. Bananas. Apples. Oranges. Grapes. Papaya. Mango. Pomegranate. Kiwi. Grapefruit. Cherries. Vegetables Lettuce. Spinach. Peas. Beets. Cauliflower. Cabbage. Broccoli. Carrots. Tomatoes. Squash. Eggplant. Herbs. Peppers. Onions. Cucumbers. Brussels sprouts. Grains Whole grains, such as whole-wheat or whole-grain breads or pasta. Unsweetened oatmeal. Bulgur. Barley. Quinoa. Brown rice. Corn or whole-wheat flour tortillas or taco shells. Meats and other proteins Seafood. Poultry without skin. Lean cuts of pork and beef. Tofu. Eggs. Nuts. Beans. Dairy Low-fat or fat-free dairy products, such as yogurt, cottage cheese, and cheese. Beverages Water. Tea. Coffee. Sugar-free or diet soda. Seltzer water. Low-fat or  nonfat milk. Milk alternatives, such as soy or almond milk. Fats and oils Olive oil. Canola oil. Sunflower oil. Grapeseed oil. Avocado. Walnuts. Sweets and desserts Sugar-free or low-fat  pudding. Sugar-free or low-fat ice cream and other frozen treats. Seasonings and condiments Herbs. Salt-free spices. Mustard. Relish. Low-salt, low-sugar ketchup. Low-salt, low-sugar barbecue sauce. Low-fat or fat-free mayonnaise. The items listed above may not be all the foods and drinks you can have. Talk with a dietitian to learn more. What foods are not recommended? Fruits Fruits canned with syrup. Vegetables Canned vegetables. Frozen vegetables with butter or cream sauce. Grains Refined white flour and flour products, such as bread, pasta, snack foods, and cereals. Meats and other proteins Fatty cuts of meat. Poultry with skin. Breaded or fried meat. Processed meats. Dairy Full-fat yogurt, cheese, or milk. Beverages Sweetened drinks, such as iced tea and soda. Fats and oils Butter. Lard. Ghee. Sweets and desserts Baked goods, such as cake, cupcakes, pastries, cookies, and cheesecake. Seasonings and condiments Spice mixes with added salt. Ketchup. Barbecue sauce. Mayonnaise. The items listed above may not be all the foods and drinks you should avoid. Talk with a dietitian to learn more. Where to find more information American Diabetes Association: diabetes.org/food-nutrition This information is not intended to replace advice given to you by your health care provider. Make sure you discuss any questions you have with your health care provider. Document Revised: 01/14/2023 Document Reviewed: 01/14/2023 Elsevier Patient Education  2024 [employer].

## 2023-08-23 ENCOUNTER — Encounter: Payer: Self-pay | Admitting: Family

## 2023-08-23 ENCOUNTER — Ambulatory Visit (INDEPENDENT_AMBULATORY_CARE_PROVIDER_SITE_OTHER): Payer: Medicare Other | Admitting: Family

## 2023-08-23 VITALS — BP 156/90 | HR 73 | Temp 97.9°F | Ht 73.0 in | Wt 202.4 lb

## 2023-08-23 DIAGNOSIS — R634 Abnormal weight loss: Secondary | ICD-10-CM

## 2023-08-23 DIAGNOSIS — R7303 Prediabetes: Secondary | ICD-10-CM | POA: Diagnosis not present

## 2023-08-23 DIAGNOSIS — Z951 Presence of aortocoronary bypass graft: Secondary | ICD-10-CM

## 2023-08-23 DIAGNOSIS — Z09 Encounter for follow-up examination after completed treatment for conditions other than malignant neoplasm: Secondary | ICD-10-CM | POA: Diagnosis not present

## 2023-08-23 DIAGNOSIS — I10 Essential (primary) hypertension: Secondary | ICD-10-CM | POA: Diagnosis not present

## 2023-08-23 DIAGNOSIS — Z0100 Encounter for examination of eyes and vision without abnormal findings: Secondary | ICD-10-CM

## 2023-08-23 DIAGNOSIS — Z992 Dependence on renal dialysis: Secondary | ICD-10-CM

## 2023-08-23 DIAGNOSIS — G4700 Insomnia, unspecified: Secondary | ICD-10-CM

## 2023-08-23 DIAGNOSIS — N186 End stage renal disease: Secondary | ICD-10-CM

## 2023-08-23 DIAGNOSIS — H5440 Blindness, one eye, unspecified eye: Secondary | ICD-10-CM

## 2023-08-23 NOTE — Progress Notes (Signed)
 Patient states he feels extremely tired.  High blood pressure. Loss of apatite  Hard to sleep  Multiple complaints

## 2023-08-23 NOTE — Progress Notes (Signed)
 TRANSITION OF CARE VISIT   Date of Admission: 08/17/2023  Date of Discharge: 08/17/2023  Transitions of Care Call: 08/21/2023  Discharged from: [facility] Emergency Department at [facility]   Discharge Diagnosis:   Summary of Admission per MD note:  ED Course/ Medical Decision Making/ A&P                               Medical Decision Making Amount and/or Complexity of Data Reviewed Labs: ordered. Radiology: ordered.   Risk OTC drugs. Prescription drug management.     Corey Park 53 y.o. presented today for chest pain, hypertension, left eye burning, headache. Working DDx that I considered at this time includes, but not limited to, uncontrolled hypertension, ACS, heart failure, aortic dissection, conjunctivitis, corneal ulcer, tension headache, headache secondary to his hypertension, stroke, epidural/subdural hematoma.   R/o DDx: ACS, heart failure, aortic dissection, conjunctivitis, corneal ulcer, tension headache, headache secondary to his hypertension, stroke, epidural/subdural hematoma: These are considered less likely due to history of present illness, physical exam, labs/imaging findings   Review of prior external notes: 08/13/2023 discharge summary   Unique Tests and My Independent Interpretation:  Fluorescein stain: No reuptake Chest x-ray: No acute findings CTA without contrast: No acute findings CBC: Unremarkable Respiratory panel: Negative Troponin: 90, 83 BMP: Hypokalemia 3.0 BNP: Pending EKG: Sinus 78 bpm, prolonged QT, LVH; similar to previous   Social Determinants of Health: homeless   Discussion with Independent Historian: None   Discussion of Management of Tests: None   Risk: Medium: prescription drug management   Risk Stratification Score: none   Staffed with [place], MD   Plan: On exam patient was in no acute distress with reasonable vital signs.  Patient was hypertensive at  188 systolic upon arrival after receiving nitro from EMS.  Patient reportedly had systolic of 260.  Patient states that he he has been taking his blood pressure meds including the carvedilol.  Patient was just admitted for severe hypertension and discharged on the 17th and states the chest pain is been consistent since then.  Patient also came in with left eye pain which is also something that was addressed during his admission which she was prescribed Polytrim and states he has been taking.  Will repeat fluorescein stain.  Will obtain labs and imaging.   Upon recheck patient is resting comfortably.  Fluorescein stain was applied and shows no uptake.  Patient states he still has the Polytrim that was prescribed to him a few days ago and has been using it so encouraged he keeps using it.  Patient given his carvedilol.  On recheck patient's blood pressure was 191 systolic and patient was resting comfortably/ Delta Troponin was negative.  Patient normally has elevated troponins however this is less than normal for him.  Patient was mildly hypokalemic however given his ESRD status will not replenish to avoid making him hyper kalemia.  At this time patient is stable to be discharged with outpatient follow-up.  Strongly recommended patient takes his medications as prescribed as I suspect his symptoms are from his uncontrolled hypertension.  Attestation: Medical screening examination/treatment/procedure(s) were performed by non-physician practitioner and as supervising physician I was immediately available for consultation/collaboration.   EKG Interpretation Date/Time:                  Friday August 17 2023 15:54:39 EST Ventricular Rate:         78 PR Interval:  184 QRS Duration:             120 QT Interval:                 464 QTC Calculation:529 R Axis:                         -28   Text Interpretation:Sinus rhythm Consider left atrial enlargement IVCD, consider atypical RBBB LVH with  secondary repolarization abnormality Anterior ST elevation, probably due to LVH Prolonged QT interval Confirmed by Jacalyn Lefevre [phone]) on 08/17/2023 4:29:26 PM   08/22/2023 [place] [facility] per PA note: Impression and Plan: He is hypertensive. Patient states he is compliant with his medications. He states he has been taking Coreg 25 mg bid, not 12.5 mg bid?. He has an appointment to see family medicine NP in am but in the meantime, will increase Benicar to help control BP better. He may need a third medication. We discussed the importance of strict BP control (to avoid stroke etc). We discussed sternal precautions (no lifting more than 10 pounds until 09/05/2023. We discussed about participation in cardiac rehab, but with HD three days per week and being blind, patient will not participate in rehab. Finally, we discussed about the importance of seeing PCP over the next few months for further surveillance of pre op HGA1C 6.1 (pre diabetes) and discussion of unintentional weight loss. He will be given nutrition information to help with pre diabetes. He will return to [facility]. It appears he missed his cardiology appointment as he was in the ED. He was instructed to make a follow up with cardiology as they will be following him indefinitely. He will return to [facility].   Today's office visit 08/23/2023: Patient presents for Emergency Department follow-up. He is accompanied by his son. Patient states feeling stable since Emergency Department discharge. Reports doing well on medication regimen, no issues/concerns. Home blood pressures above goal. He does not complain of red flag symptoms such as but not limited to chest pain, shortness of breath, worst headache of life, nausea/vomiting. Established with Nephrology/hemodialysis. Decreased appetite causing weight loss. Insomnia. Reports he takes naps during the day. Reports left eye blindness and normal vision of right eye. No further  issues/concerns for discussion today.   Patient/Caregiver self-reported problems/concerns: see above  MEDICATIONS  Medication Reconciliation conducted with patient/caregiver? (Yes/ No): Yes  New medications prescribed/discontinued upon discharge? (Yes/No): No   Barriers identified related to medications: No  LABS  Lab Reviewed (Yes/No/NA): Yes  PHYSICAL EXAM:  Today's Vitals   08/23/23 1023 08/23/23 1129  BP: (!) 156/94 (!) 156/90  Pulse: 73   Temp: 97.9 F (36.6 C)   TempSrc: Oral   SpO2: 98%   Weight: 202 lb 6.4 oz (91.8 kg)   Height: 6\' 1"  (1.854 m)    Body mass index is 26.7 kg/m.   Physical Exam HENT:     Head: Normocephalic and atraumatic.     Nose: Nose normal.     Mouth/Throat:     Mouth: Mucous membranes are moist.     Pharynx: Oropharynx is clear.  Eyes:     Conjunctiva/sclera: Conjunctivae normal.     Pupils: Pupils are equal, round, and reactive to light.     Comments: Right eye extraocular movement intact.  Cardiovascular:     Rate and Rhythm: Normal rate and regular rhythm.     Pulses: Normal pulses.     Heart  sounds: Normal heart sounds.  Pulmonary:     Effort: Pulmonary effort is normal.     Breath sounds: Normal breath sounds.  Musculoskeletal:        General: Normal range of motion.     Cervical back: Normal range of motion and neck supple.  Neurological:     General: No focal deficit present.     Mental Status: He is alert and oriented to person, place, and time.  Psychiatric:        Mood and Affect: Mood normal.        Behavior: Behavior normal.    ASSESSMENT AND PLAN: 1. Hospital discharge follow-up (Primary) - Reviewed hospital course, current medications, ensured proper follow-up in place, and addressed concerns.   2. Uncontrolled hypertension 3. S/P CABG x 3 - Blood pressure not at goal during today's visit. Patient asymptomatic without chest pressure, chest pain, palpitations, shortness of breath, worst headache of life, and  any additional red flag symptoms. - Continue present management.  - Keep all scheduled appointments with [facility].  - Keep all scheduled appointments with Cardiology.  4. Prediabetes - Hemoglobin A1c 5.8% on 06/26/2023. - Follow-up in 4 months or sooner if needed.   5. Routine eye exam 6. 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 - Ambulatory referral to Ophthalmology  7. ESRD on dialysis [facility]) - Keep all scheduled appointments with Nephrology/hemodialysis.  8. Insomnia, unspecified type - Referral to Sleep Studies for evaluation/management. - Ambulatory referral to Sleep Studies  9. Loss of weight - Referral to Medical Weight Management for evaluation/management. - Amb Ref to Medical Weight Management   PATIENT EDUCATION PROVIDED: See AVS   FOLLOW-UP (Include any further testing or referrals):  - Keep all scheduled appointments with [facility]. - Keep all scheduled appointments with Cardiology.  - Keep all scheduled appointments with Nephrology/hemodialysis.  - Referral to Sleep Studies.  - Referral to Medical Weight Management.  Patient was given clear instructions to go to Emergency Department or return to medical center if symptoms don't improve, worsen, or new problems develop.The patient verbalized understanding.

## 2023-08-30 ENCOUNTER — Encounter (INDEPENDENT_AMBULATORY_CARE_PROVIDER_SITE_OTHER): Payer: Self-pay

## 2023-09-01 ENCOUNTER — Emergency Department (HOSPITAL_COMMUNITY)
Admission: EM | Admit: 2023-09-01 | Discharge: 2023-09-01 | Disposition: A | Discharge status: Home / Self Care | Attending: Emergency Medicine | Admitting: Emergency Medicine

## 2023-09-01 ENCOUNTER — Emergency Department (HOSPITAL_COMMUNITY)

## 2023-09-01 ENCOUNTER — Other Ambulatory Visit: Payer: Self-pay

## 2023-09-01 DIAGNOSIS — N186 End stage renal disease: Secondary | ICD-10-CM | POA: Insufficient documentation

## 2023-09-01 DIAGNOSIS — I2510 Atherosclerotic heart disease of native coronary artery without angina pectoris: Secondary | ICD-10-CM | POA: Insufficient documentation

## 2023-09-01 DIAGNOSIS — Z7982 Long term (current) use of aspirin: Secondary | ICD-10-CM | POA: Insufficient documentation

## 2023-09-01 DIAGNOSIS — Z992 Dependence on renal dialysis: Secondary | ICD-10-CM | POA: Diagnosis not present

## 2023-09-01 DIAGNOSIS — Z951 Presence of aortocoronary bypass graft: Secondary | ICD-10-CM | POA: Diagnosis not present

## 2023-09-01 DIAGNOSIS — R519 Headache, unspecified: Secondary | ICD-10-CM

## 2023-09-01 DIAGNOSIS — I120 Hypertensive chronic kidney disease with stage 5 chronic kidney disease or end stage renal disease: Secondary | ICD-10-CM | POA: Diagnosis not present

## 2023-09-01 DIAGNOSIS — I10 Essential (primary) hypertension: Secondary | ICD-10-CM

## 2023-09-01 LAB — CBC WITH DIFFERENTIAL/PLATELET
Abs Immature Granulocytes: 0.01 10*3/uL (ref 0.00–0.07)
Basophils Absolute: 0 10*3/uL (ref 0.0–0.1)
Basophils Relative: 0 %
Eosinophils Absolute: 0.2 10*3/uL (ref 0.0–0.5)
Eosinophils Relative: 4 %
HCT: 31.8 % — ABNORMAL LOW (ref 39.0–52.0)
Hemoglobin: 9.9 g/dL — ABNORMAL LOW (ref 13.0–17.0)
Immature Granulocytes: 0 %
Lymphocytes Relative: 25 %
Lymphs Abs: 1.2 10*3/uL (ref 0.7–4.0)
MCH: 30.1 pg (ref 26.0–34.0)
MCHC: 31.1 g/dL (ref 30.0–36.0)
MCV: 96.7 fL (ref 80.0–100.0)
Monocytes Absolute: 0.5 10*3/uL (ref 0.1–1.0)
Monocytes Relative: 11 %
Neutro Abs: 3 10*3/uL (ref 1.7–7.7)
Neutrophils Relative %: 60 %
Platelets: 160 10*3/uL (ref 150–400)
RBC: 3.29 MIL/uL — ABNORMAL LOW (ref 4.22–5.81)
RDW: 19 % — ABNORMAL HIGH (ref 11.5–15.5)
WBC: 5 10*3/uL (ref 4.0–10.5)
nRBC: 0 % (ref 0.0–0.2)

## 2023-09-01 LAB — BASIC METABOLIC PANEL
Anion gap: 15 (ref 5–15)
BUN: 28 mg/dL — ABNORMAL HIGH (ref 6–20)
CO2: 25 mmol/L (ref 22–32)
Calcium: 8.8 mg/dL — ABNORMAL LOW (ref 8.9–10.3)
Chloride: 96 mmol/L — ABNORMAL LOW (ref 98–111)
Creatinine, Ser: 7.97 mg/dL — ABNORMAL HIGH (ref 0.61–1.24)
GFR, Estimated: 8 mL/min — ABNORMAL LOW (ref >60)
Glucose, Bld: 90 mg/dL (ref 70–99)
Potassium: 4.6 mmol/L (ref 3.5–5.1)
Sodium: 136 mmol/L (ref 135–145)

## 2023-09-01 MED ORDER — OLMESARTAN MEDOXOMIL 40 MG PO TABS
20.0000 mg | ORAL_TABLET | Freq: Every day | ORAL | 1 refills | Status: DC
Start: 2023-09-01 — End: 2024-02-14

## 2023-09-01 MED ORDER — ACETAMINOPHEN 500 MG PO TABS
1000.0000 mg | ORAL_TABLET | Freq: Once | ORAL | Status: AC
  Administered 2023-09-01: 1000 mg via ORAL
  Filled 2023-09-01: qty 2

## 2023-09-01 MED ORDER — METOCLOPRAMIDE HCL 5 MG/ML IJ SOLN
10.0000 mg | Freq: Once | INTRAMUSCULAR | Status: AC
  Administered 2023-09-01: 10 mg via INTRAVENOUS
  Filled 2023-09-01: qty 2

## 2023-09-01 MED ORDER — CARVEDILOL 12.5 MG PO TABS: 25.0000 mg | ORAL_TABLET | Freq: Two times a day (BID) | ORAL | 1 refills | Status: DC

## 2023-09-01 NOTE — Discharge Instructions (Signed)
 You were seen in the emergency department for your headache.  Your head CT scan here was normal and your labs had no new abnormalities.  You were given headache medication with improvement.  You can continue to take Tylenol every 6 hours as needed for your headache.  With your headache starting after your olmesartan was increased, you can decrease this back to 20 mg and instead increase Coreg to 25 mg twice daily.  You can follow-up with your primary doctor to have your symptoms and blood pressure rechecked.  You should return to the emergency department for significantly worsening headache, repetitive vomiting, numbness or weakness in one-sided body compared to the other or any other new or concerning symptoms.

## 2023-09-01 NOTE — ED Triage Notes (Addendum)
 Pt to ED via EMS from home c/o headache x 3 days after changing to new med-losartan. Dialysis yesterday-did complete. NIH negative. Legally blind. No facial droop. 142/90 99% 72 CBG 101. Right arm limb alert

## 2023-09-01 NOTE — ED Notes (Signed)
 Patient transported to CT

## 2023-09-01 NOTE — ED Provider Notes (Signed)
 [place] EMERGENCY DEPARTMENT AT [facility] Provider Note   CSN: [id number] Arrival date & time: 09/01/23  1048     History  Chief Complaint  Patient presents with   Headache    Corey Park is a 53 y.o. male.  Patient is a 53 year old male with a past medical history of ESRD on MWF HD, hypertension, CAD status post CABG presenting to the emergency department with headache.  The patient states that he has had a headache on and off for the last 4 days since recently starting losartan for his blood pressure.  He states that it is a frontal headache that comes and goes.  He states that it is not sudden onset and will gradually get worse over time.  He states that he is legally blind at baseline and has had no changes to his baseline vision.  He reports associated nausea but has not vomited.  Denies any weakness and states he has had tingling just in his fingertips.  He states he has not missed any dialysis sessions.  The history is provided by the patient.  Headache      Home Medications Prior to Admission medications   Medication Sig Start Date End Date Taking? Authorizing Provider  aspirin EC 81 MG tablet Take 1 tablet (81 mg total) by mouth daily. Swallow whole. 07/08/23   Gold, Wayne E, PA-C  atorvastatin (LIPITOR) 80 MG tablet Take 1 tablet (80 mg total) by mouth daily. 08/13/23   Rhetta Mura, MD  calcium acetate (PHOSLO) 667 MG capsule Take 3 capsules (2,001 mg total) by mouth with breakfast, with lunch, and with evening meal. 07/08/23   Gold, Glenice Laine, PA-C  carvedilol (COREG) 12.5 MG tablet Take 2 tablets (25 mg total) by mouth 2 (two) times daily with a meal. 09/01/23 10/01/23  Elayne Snare K, DO  clopidogrel (PLAVIX) 75 MG tablet Take 1 tablet (75 mg total) by mouth daily. 07/08/23   Gold, Wayne E, PA-C  famotidine (PEPCID) 20 MG tablet Take 20 mg by mouth daily. 04/20/23   [provider]  ferric citrate (AURYXIA) 1 GM 210 MG(Fe) tablet Take 2  tablets (420 mg total) by mouth daily. Patient taking differently: Take 420 mg by mouth 3 (three) times daily with meals. 12/14/22   Modena Slater, DO  multivitamin (RENA-VIT) TABS tablet Take 1 tablet by mouth daily. 11/22/22   [provider]  olmesartan (BENICAR) 40 MG tablet Take 0.5 tablets (20 mg total) by mouth daily. 09/01/23   Elayne Snare K, DO  pantoprazole (PROTONIX) 20 MG tablet Take 20 mg by mouth daily as needed for heartburn. 04/19/23   [provider]  trimethoprim-polymyxin b (POLYTRIM) ophthalmic solution Place 1 drop into the left eye every 6 (six) hours. 08/13/23   Rhetta Mura, MD  Vitamin D, Ergocalciferol, (DRISDOL) 1.25 MG (50000 UNIT) CAPS capsule Take 1 capsule (50,000 Units total) by mouth every 7 (seven) days. 08/19/23   Rhetta Mura, MD      Allergies    Gabapentin, Oxycodone, and Imdur [isosorbide nitrate]    Review of Systems   Review of Systems  Neurological:  Positive for headaches.    Physical Exam Updated Vital Signs BP (!) 164/91   Pulse 75   Temp 98.4 F (36.9 C) (Oral)   Resp 16   Ht 6\' 1"  (1.854 m)   Wt 91.6 kg   SpO2 100%   BMI 26.65 kg/m  Physical Exam Vitals and nursing note reviewed.  Constitutional:  General: He is not in acute distress.    Appearance: He is well-developed.  HENT:     Head: Normocephalic and atraumatic.     Mouth/Throat:     Mouth: Mucous membranes are moist.     Pharynx: Oropharynx is clear.  Eyes:     Extraocular Movements: Extraocular movements intact.     Comments: R pupil 2 mm and reactive, L pupil with chronic cloudy appearance  Cardiovascular:     Rate and Rhythm: Normal rate and regular rhythm.     Heart sounds: Normal heart sounds.  Pulmonary:     Effort: Pulmonary effort is normal.     Breath sounds: Normal breath sounds.  Abdominal:     Palpations: Abdomen is soft.     Tenderness: There is no abdominal tenderness.  Musculoskeletal:        General: Normal  range of motion.     Cervical back: Normal range of motion and neck supple.  Skin:    General: Skin is warm and dry.  Neurological:     Mental Status: He is alert and oriented to person, place, and time.     GCS: GCS eye subscore is 4. GCS verbal subscore is 5. GCS motor subscore is 6.     Cranial Nerves: No cranial nerve deficit, dysarthria or facial asymmetry.     Sensory: No sensory deficit.     Motor: No weakness.  Psychiatric:        Mood and Affect: Mood normal.        Speech: Speech normal.        Behavior: Behavior normal.     ED Results / Procedures / Treatments   Labs (all labs ordered are listed, but only abnormal results are displayed) Labs Reviewed  BASIC METABOLIC PANEL - Abnormal; Notable for the following components:      Result Value   Chloride 96 (*)    BUN 28 (*)    Creatinine, Ser 7.97 (*)    Calcium 8.8 (*)    GFR, Estimated 8 (*)    All other components within normal limits  CBC WITH DIFFERENTIAL/PLATELET - Abnormal; Notable for the following components:   RBC 3.29 (*)    Hemoglobin 9.9 (*)    HCT 31.8 (*)    RDW 19.0 (*)    All other components within normal limits    EKG EKG Interpretation Date/Time:  Saturday September 01 2023 10:58:49 EST Ventricular Rate:  80 PR Interval:  171 QRS Duration:  112 QT Interval:  469 QTC Calculation: 542 R Axis:   -63  Text Interpretation: Sinus rhythm Probable left atrial enlargement Incomplete RBBB and LAFB LVH with secondary repolarization abnormality Anterior Q waves, possibly due to LVH Prolonged QT interval No significant change since last tracing Confirmed by Elayne Snare ([id number]) on 09/01/2023 11:00:10 AM  Radiology CT Head Wo Contrast Result Date: 09/01/2023 CLINICAL DATA:  53 year old male with persistent headache for 3 days. Dialysis yesterday. EXAM: CT HEAD WITHOUT CONTRAST TECHNIQUE: Contiguous axial images were obtained from the base of the skull through the vertex without intravenous contrast.  RADIATION DOSE REDUCTION: This exam was performed according to the departmental dose-optimization program which includes automated exposure control, adjustment of the mA and/or kV according to patient size and/or use of iterative reconstruction technique. COMPARISON:  Brain MRI 08/12/2023, head CT 08/17/2023. FINDINGS: Brain: Stable cerebral volume. No midline shift, ventriculomegaly, mass effect, evidence of mass lesion, intracranial hemorrhage or evidence of cortically based acute infarction. Stable gray-white matter  differentiation, mild scattered white matter hypodensity. Vascular: Extensive Calcified atherosclerosis at the skull base. No suspicious intracranial vascular hyperdensity. Skull: No acute osseous abnormality identified.  Intact. Sinuses/Orbits: Stable.  Mild paranasal sinus mucosal thickening. Other: Calcified scalp vessel atherosclerosis. No acute orbit or scalp soft tissue finding. IMPRESSION: No acute intracranial abnormality. Stable non contrast CT appearance of mild to moderate chronic white matter changes, advanced calcified atherosclerosis. Electronically Signed   By: [name] M.D.   On: 09/01/2023 12:22    Procedures Procedures    Medications Ordered in ED Medications  metoCLOPramide (REGLAN) injection 10 mg (10 mg Intravenous Given 09/01/23 1127)  acetaminophen (TYLENOL) tablet 1,000 mg (1,000 mg Oral Given 09/01/23 1127)    ED Course/ Medical Decision Making/ A&P Clinical Course as of 09/01/23 1312  Sat Sep 01, 2023  1308 Labs at baseline without acute abnormality. CTH without acute disease. Patient's BP stable in 160s. Headache has resolved. He is stable for discharge home. With increased symptoms with increasing olmesartan, will recommended decreasing back to 20 mg and increasing Coreg to 25 mg BID. Recommended close primary care follow up. [VK]    Clinical Course User Index [VK] Rexford Maus, DO                                 Medical Decision Making This  patient presents to the ED with chief complaint(s) of HA with pertinent past medical history of ESRD, HTN, CAD s/p CABG which further complicates the presenting complaint. The complaint involves an extensive differential diagnosis and also carries with it a high risk of complications and morbidity.    The differential diagnosis includes migraine headache, tension headache, considering ICH or mass effect with new onset headache, no focal neurologic deficits making CVA or TIA less likely, hypertensive urgency  Additional history obtained: Additional history obtained from N/A Records reviewed Primary Care Documents and outpatient CT surgery records  ED Course and Reassessment: On patient's arrival he is mildly hypertensive otherwise hemodynamically stable in no acute distress.  He has no new neurologic deficits from his baseline.  Due to patient's age and significant comorbidities, will have head CT to evaluate for intracranial abnormality as cause of his headache as well as basic labs and will be treated with Tylenol and Reglan and will be closely reassessed.  Independent labs interpretation:  The following labs were independently interpreted: at baseline/no acute abnormality  Independent visualization of imaging: - I independently visualized the following imaging with scope of interpretation limited to determining acute life threatening conditions related to emergency care: CTH, which revealed no acute disease  Consultation: - Consulted or discussed management/test interpretation w/ external professional: N/A  Consideration for admission or further workup: Patient has no emergent conditions requiring admission or further work-up at this time and is stable for discharge home with primary care follow-up  Social Determinants of health: N/A    Amount and/or Complexity of Data Reviewed Labs: ordered. Radiology: ordered.  Risk OTC drugs. Prescription drug management.          Final  Clinical Impression(s) / ED Diagnoses Final diagnoses:  Bad headache  Hypertension, unspecified type    Rx / DC Orders ED Discharge Orders          Ordered    carvedilol (COREG) 12.5 MG tablet  2 times daily with meals        09/01/23 1311    olmesartan (BENICAR) 40 MG tablet  Daily        09/01/23 1311              Rexford Maus, [place] 09/01/23 1312

## 2023-09-03 ENCOUNTER — Telehealth: Payer: Self-pay

## 2023-09-03 NOTE — Transitions of Care (Post Inpatient/ED Visit) (Unsigned)
   09/03/2023  Name: Corey Park MRN: [id number] DOB: Aug 27, 1970  Today's TOC FU Call Status: Today's TOC FU Call Status:: Unsuccessful Call (1st Attempt) Unsuccessful Call (1st Attempt) Date: 09/03/23  Attempted to reach the patient regarding the most recent Inpatient/ED visit.  Follow Up Plan: Additional outreach attempts will be made to reach the patient to complete the Transitions of Care (Post Inpatient/ED visit) call.   Signature Karena Addison, LPN [facility] Nurse Health Advisor Direct Dial [phone]

## 2023-09-05 ENCOUNTER — Encounter (HOSPITAL_COMMUNITY): Payer: Self-pay

## 2023-09-05 ENCOUNTER — Other Ambulatory Visit: Payer: Self-pay

## 2023-09-05 ENCOUNTER — Emergency Department (HOSPITAL_COMMUNITY)
Admission: EM | Admit: 2023-09-05 | Discharge: 2023-09-05 | Disposition: A | Discharge status: Home / Self Care | Attending: Emergency Medicine | Admitting: Emergency Medicine

## 2023-09-05 DIAGNOSIS — R519 Headache, unspecified: Secondary | ICD-10-CM | POA: Diagnosis present

## 2023-09-05 DIAGNOSIS — Z992 Dependence on renal dialysis: Secondary | ICD-10-CM | POA: Diagnosis not present

## 2023-09-05 DIAGNOSIS — J0110 Acute frontal sinusitis, unspecified: Secondary | ICD-10-CM | POA: Diagnosis not present

## 2023-09-05 DIAGNOSIS — N186 End stage renal disease: Secondary | ICD-10-CM | POA: Diagnosis not present

## 2023-09-05 DIAGNOSIS — Z7982 Long term (current) use of aspirin: Secondary | ICD-10-CM | POA: Diagnosis not present

## 2023-09-05 LAB — CBC WITH DIFFERENTIAL/PLATELET
Abs Immature Granulocytes: 0.02 10*3/uL (ref 0.00–0.07)
Basophils Absolute: 0 10*3/uL (ref 0.0–0.1)
Basophils Relative: 0 %
Eosinophils Absolute: 0.2 10*3/uL (ref 0.0–0.5)
Eosinophils Relative: 4 %
HCT: 32.7 % — ABNORMAL LOW (ref 39.0–52.0)
Hemoglobin: 10.3 g/dL — ABNORMAL LOW (ref 13.0–17.0)
Immature Granulocytes: 0 %
Lymphocytes Relative: 22 %
Lymphs Abs: 1.1 10*3/uL (ref 0.7–4.0)
MCH: 30.5 pg (ref 26.0–34.0)
MCHC: 31.5 g/dL (ref 30.0–36.0)
MCV: 96.7 fL (ref 80.0–100.0)
Monocytes Absolute: 0.5 10*3/uL (ref 0.1–1.0)
Monocytes Relative: 9 %
Neutro Abs: 3.2 10*3/uL (ref 1.7–7.7)
Neutrophils Relative %: 65 %
Platelets: 140 10*3/uL — ABNORMAL LOW (ref 150–400)
RBC: 3.38 MIL/uL — ABNORMAL LOW (ref 4.22–5.81)
RDW: 18.5 % — ABNORMAL HIGH (ref 11.5–15.5)
WBC: 5.1 10*3/uL (ref 4.0–10.5)
nRBC: 0 % (ref 0.0–0.2)

## 2023-09-05 LAB — BASIC METABOLIC PANEL
Anion gap: 13 (ref 5–15)
BUN: 58 mg/dL — ABNORMAL HIGH (ref 6–20)
CO2: 22 mmol/L (ref 22–32)
Calcium: 9.2 mg/dL (ref 8.9–10.3)
Chloride: 102 mmol/L (ref 98–111)
Creatinine, Ser: 11.96 mg/dL — ABNORMAL HIGH (ref 0.61–1.24)
GFR, Estimated: 5 mL/min — ABNORMAL LOW (ref >60)
Glucose, Bld: 141 mg/dL — ABNORMAL HIGH (ref 70–99)
Potassium: 4.4 mmol/L (ref 3.5–5.1)
Sodium: 137 mmol/L (ref 135–145)

## 2023-09-05 MED ORDER — DIPHENHYDRAMINE HCL 50 MG/ML IJ SOLN
25.0000 mg | Freq: Once | INTRAMUSCULAR | Status: AC
  Administered 2023-09-05: 25 mg via INTRAVENOUS
  Filled 2023-09-05: qty 1

## 2023-09-05 MED ORDER — PROCHLORPERAZINE EDISYLATE 10 MG/2ML IJ SOLN
10.0000 mg | Freq: Once | INTRAMUSCULAR | Status: AC
  Administered 2023-09-05: 10 mg via INTRAVENOUS
  Filled 2023-09-05: qty 2

## 2023-09-05 MED ORDER — AMOXICILLIN-POT CLAVULANATE 875-125 MG PO TABS: 1.0000 | ORAL_TABLET | Freq: Two times a day (BID) | ORAL | 0 refills | Status: DC

## 2023-09-05 MED ORDER — AMOXICILLIN-POT CLAVULANATE 875-125 MG PO TABS
1.0000 | ORAL_TABLET | Freq: Once | ORAL | Status: AC
  Administered 2023-09-05: 1 via ORAL
  Filled 2023-09-05: qty 1

## 2023-09-05 NOTE — Transitions of Care (Post Inpatient/ED Visit) (Signed)
   09/05/2023  Name: Corey Park MRN: [id number] DOB: [birth year]  Today's TOC FU Call Status: Today's TOC FU Call Status:: Unsuccessful Call (3rd Attempt) Unsuccessful Call (1st Attempt) Date: 09/03/23 Unsuccessful Call (2nd Attempt) Date: 09/05/23 Unsuccessful Call (3rd Attempt) Date: 09/05/23  Attempted to reach the patient regarding the most recent Inpatient/ED visit.  Follow Up Plan: No further outreach attempts will be made at this time. We have been unable to contact the patient.  Signature Karena Addison, LPN [facility] Nurse Health Advisor Direct Dial [phone]

## 2023-09-05 NOTE — ED Provider Notes (Signed)
 [place] EMERGENCY DEPARTMENT AT [facility] Provider Note   CSN: [id number] Arrival date & time: 09/05/23  3875     History  Chief Complaint  Patient presents with   Headache    Pt c/o headache. EMS BP 189/92. Pt states his BP was altered. Pt is a MWF dialysis pt and diabetic. Current CBG 201.     Corey Park is a 53 y.o. male.  53 yo M with a chief complaints of a frontal headache.  Worse on the right than the left.  This has been going on for about a week now.  The patient had been seen in the emergency department setting and had CT imaging and blood work and was discharged home.  He has not been able to go to dialysis since Friday.  Has some trouble breathing at baseline and does not think it is much worse.  He denies one-sided numbness or weakness denies difficulty speech or swallowing.  He denies any fevers at home but said that when EMS checked his temperature he had a fever.   Headache      Home Medications Prior to Admission medications   Medication Sig Start Date End Date Taking? Authorizing Provider  amoxicillin-clavulanate (AUGMENTIN) 875-125 MG tablet Take 1 tablet by mouth every 12 (twelve) hours. 09/05/23  Yes Melene Plan, DO  aspirin EC 81 MG tablet Take 1 tablet (81 mg total) by mouth daily. Swallow whole. 07/08/23   Gold, Wayne E, PA-C  atorvastatin (LIPITOR) 80 MG tablet Take 1 tablet (80 mg total) by mouth daily. 08/13/23   Rhetta Mura, MD  calcium acetate (PHOSLO) 667 MG capsule Take 3 capsules (2,001 mg total) by mouth with breakfast, with lunch, and with evening meal. 07/08/23   Gold, Glenice Laine, PA-C  carvedilol (COREG) 12.5 MG tablet Take 2 tablets (25 mg total) by mouth 2 (two) times daily with a meal. 09/01/23 10/01/23  Elayne Snare K, DO  clopidogrel (PLAVIX) 75 MG tablet Take 1 tablet (75 mg total) by mouth daily. 07/08/23   Gold, Wayne E, PA-C  famotidine (PEPCID) 20 MG tablet Take 20 mg by mouth daily. 04/20/23   [provider]  ferric citrate (AURYXIA) 1 GM 210 MG(Fe) tablet Take 2 tablets (420 mg total) by mouth daily. Patient taking differently: Take 420 mg by mouth 3 (three) times daily with meals. 12/14/22   Modena Slater, DO  multivitamin (RENA-VIT) TABS tablet Take 1 tablet by mouth daily. 11/22/22   [provider]  olmesartan (BENICAR) 40 MG tablet Take 0.5 tablets (20 mg total) by mouth daily. 09/01/23   Elayne Snare K, DO  pantoprazole (PROTONIX) 20 MG tablet Take 20 mg by mouth daily as needed for heartburn. 04/19/23   [provider]  trimethoprim-polymyxin b (POLYTRIM) ophthalmic solution Place 1 drop into the left eye every 6 (six) hours. 08/13/23   Rhetta Mura, MD  Vitamin D, Ergocalciferol, (DRISDOL) 1.25 MG (50000 UNIT) CAPS capsule Take 1 capsule (50,000 Units total) by mouth every 7 (seven) days. 08/19/23   Rhetta Mura, MD      Allergies    Gabapentin, Oxycodone, and Imdur [isosorbide nitrate]    Review of Systems   Review of Systems  Neurological:  Positive for headaches.    Physical Exam Updated Vital Signs BP (!) 182/108 (BP Location: Left Arm)   Pulse 72   Temp 98.3 F (36.8 C) (Oral)   Resp (!) 27   Ht 6\' 1"  (1.854 m)   Wt 91  kg   SpO2 99%   BMI 26.47 kg/m  Physical Exam Vitals and nursing note reviewed.  Constitutional:      Appearance: He is well-developed.  HENT:     Head: Normocephalic and atraumatic.     Comments: Swollen turbinates, posterior nasal drip, right TM with a effusion no obvious erythema or distortion of landmarks.  Right frontal sinuses exquisitely tender to percussion  Eyes:     Pupils: Pupils are equal, round, and reactive to light.  Neck:     Vascular: No JVD.  Cardiovascular:     Rate and Rhythm: Normal rate and regular rhythm.     Heart sounds: No murmur heard.    No friction rub. No gallop.  Pulmonary:     Effort: No respiratory distress.     Breath sounds: No wheezing.  Abdominal:     General:  There is no distension.     Tenderness: There is no abdominal tenderness. There is no guarding or rebound.  Musculoskeletal:        General: Normal range of motion.     Cervical back: Normal range of motion and neck supple.  Skin:    Coloration: Skin is not pale.     Findings: No rash.  Neurological:     Mental Status: He is alert and oriented to person, place, and time.  Psychiatric:        Behavior: Behavior normal.     ED Results / Procedures / Treatments   Labs (all labs ordered are listed, but only abnormal results are displayed) Labs Reviewed  CBC WITH DIFFERENTIAL/PLATELET - Abnormal; Notable for the following components:      Result Value   RBC 3.38 (*)    Hemoglobin 10.3 (*)    HCT 32.7 (*)    RDW 18.5 (*)    Platelets 140 (*)    All other components within normal limits  BASIC METABOLIC PANEL - Abnormal; Notable for the following components:   Glucose, Bld 141 (*)    BUN 58 (*)    Creatinine, Ser 11.96 (*)    GFR, Estimated 5 (*)    All other components within normal limits    EKG EKG Interpretation Date/Time:  Wednesday September 05 2023 08:16:14 EDT Ventricular Rate:  71 PR Interval:  181 QRS Duration:  116 QT Interval:  473 QTC Calculation: 515 R Axis:   -65  Text Interpretation: Sinus rhythm Left anterior fascicular block LVH with secondary repolarization abnormality No significant change since last tracing Confirmed by Melene Plan [phone]) on 09/05/2023 8:26:40 AM  Radiology No results found.  Procedures Procedures    Medications Ordered in ED Medications  prochlorperazine (COMPAZINE) injection 10 mg (10 mg Intravenous Given 09/05/23 0807)  diphenhydrAMINE (BENADRYL) injection 25 mg (25 mg Intravenous Given 09/05/23 0805)  amoxicillin-clavulanate (AUGMENTIN) 875-125 MG per tablet 1 tablet (1 tablet Oral Given 09/05/23 0808)    ED Course/ Medical Decision Making/ A&P                                 Medical Decision Making Amount and/or Complexity  of Data Reviewed Labs: ordered. ECG/medicine tests: ordered.  Risk Prescription drug management.   53 yo M with a significant past medical history of end-stage renal disease on dialysis Monday Wednesday and Friday comes in with a chief complaints of right-sided headache.  This is new for him going on for the past week or so.  He denies any obvious cough congestion or fever but said he was febrile with EMS.  Clinically the patient has a URI swollen turbinates posterior nasal drip and he also has signs of sinusitis.  Will start him on antibiotics.  He has missed his last 2 dialysis sessions we will check blood work here.  Blood work unremarkable, no acute anemia, potassium is normal no significant electrolyte abnormalities otherwise.  Patient was reassessed and feeling much better after headache cocktail.  Course of antibiotics.  His dialysis is at 4 PM today.  Encouraged to go to his session this afternoon.  The patients results and plan were reviewed and discussed.   Any x-rays performed were independently reviewed by myself.   Differential diagnosis were considered with the presenting HPI.  Medications  prochlorperazine (COMPAZINE) injection 10 mg (10 mg Intravenous Given 09/05/23 0807)  diphenhydrAMINE (BENADRYL) injection 25 mg (25 mg Intravenous Given 09/05/23 0805)  amoxicillin-clavulanate (AUGMENTIN) 875-125 MG per tablet 1 tablet (1 tablet Oral Given 09/05/23 0808)    Vitals:   09/05/23 0709 09/05/23 0724 09/05/23 0845  BP:  (!) 179/92 (!) 182/108  Pulse:  71 72  Resp:  17 (!) 27  Temp:  98.3 F (36.8 C)   TempSrc:  Oral   SpO2:  99% 99%  Weight: 91 kg    Height: 6\' 1"  (1.854 m)      Final diagnoses:  Acute frontal sinusitis, recurrence not specified    Admission/ observation were discussed with the admitting physician, patient and/or family and they are comfortable with the plan.          Final Clinical Impression(s) / ED Diagnoses Final diagnoses:  Acute  frontal sinusitis, recurrence not specified    Rx / DC Orders ED Discharge Orders          Ordered    amoxicillin-clavulanate (AUGMENTIN) 875-125 MG tablet  Every 12 hours        09/05/23 0902              Melene Plan, DO 09/05/23 [phone]

## 2023-09-05 NOTE — Discharge Instructions (Signed)
 I am going to treat you like you have a sinus infection.  Please go to the dialysis this afternoon as scheduled.

## 2023-09-05 NOTE — Transitions of Care (Post Inpatient/ED Visit) (Signed)
   09/05/2023  Name: Corey Park MRN: [id number] DOB: December 22, 1970  Today's TOC FU Call Status: Today's TOC FU Call Status:: Unsuccessful Call (2nd Attempt) Unsuccessful Call (1st Attempt) Date: 09/03/23 Unsuccessful Call (2nd Attempt) Date: 09/05/23  Attempted to reach the patient regarding the most recent Inpatient/ED visit.  Follow Up Plan: Additional outreach attempts will be made to reach the patient to complete the Transitions of Care (Post Inpatient/ED visit) call.   Signature  Karena Addison, LPN [facility] Nurse Health Advisor Direct Dial [phone]

## 2023-09-06 ENCOUNTER — Telehealth: Payer: Self-pay

## 2023-09-06 NOTE — Transitions of Care (Post Inpatient/ED Visit) (Signed)
   09/06/2023  Name: Corey Park MRN: [id number] DOB: [birth year]  Today's TOC FU Call Status: Today's TOC FU Call Status:: Unsuccessful Call (1st Attempt) Unsuccessful Call (1st Attempt) Date: 09/06/23  Attempted to reach the patient regarding the most recent Inpatient/ED visit.  Follow Up Plan: Additional outreach attempts will be made to reach the patient to complete the Transitions of Care (Post Inpatient/ED visit) call.   Signature Karena Addison, LPN [facility] Nurse Health Advisor Direct Dial [phone]

## 2023-09-11 NOTE — Transitions of Care (Post Inpatient/ED Visit) (Signed)
   09/11/2023  Name: Corey Park MRN: [id number] DOB: 1970-12-02  Today's TOC FU Call Status: Today's TOC FU Call Status:: Unsuccessful Call (2nd Attempt) Unsuccessful Call (1st Attempt) Date: 09/06/23 Unsuccessful Call (2nd Attempt) Date: 09/11/23  Attempted to reach the patient regarding the most recent Inpatient/ED visit.  Follow Up Plan: Additional outreach attempts will be made to reach the patient to complete the Transitions of Care (Post Inpatient/ED visit) call.   Signature Karena Addison, LPN [facility] Nurse Health Advisor Direct Dial [phone]

## 2023-09-11 NOTE — Transitions of Care (Post Inpatient/ED Visit) (Signed)
   09/11/2023  Name: Corey Park MRN: [id number] DOB: 08/11/1970  Today's TOC FU Call Status: Today's TOC FU Call Status:: Unsuccessful Call (3rd Attempt) Unsuccessful Call (1st Attempt) Date: 09/06/23 Unsuccessful Call (2nd Attempt) Date: 09/11/23 Unsuccessful Call (3rd Attempt) Date: 09/11/23  Attempted to reach the patient regarding the most recent Inpatient/ED visit.  Follow Up Plan: No further outreach attempts will be made at this time. We have been unable to contact the patient.  Signature Karena Addison, LPN [facility] Nurse Health Advisor Direct Dial [phone]

## 2023-09-12 ENCOUNTER — Encounter: Payer: Self-pay | Admitting: Cardiology

## 2023-10-02 ENCOUNTER — Other Ambulatory Visit: Payer: Self-pay | Admitting: Student

## 2023-10-03 NOTE — Telephone Encounter (Signed)
 Established with Cardiology. Request refills from the same. Please let me know if I can further assist.

## 2023-10-05 NOTE — Telephone Encounter (Signed)
 Established with Cardiology. Request refills from the same. Please let me know if I can further assist.

## 2023-10-08 NOTE — Telephone Encounter (Signed)
 Established with Cardiology. Request refills from the same. Please let me know if I can further assist.

## 2023-11-01 ENCOUNTER — Ambulatory Visit

## 2023-11-01 VITALS — BP 182/108 | Ht 73.0 in | Wt 215.0 lb

## 2023-11-01 DIAGNOSIS — Z0189 Encounter for other specified special examinations: Secondary | ICD-10-CM

## 2023-11-01 DIAGNOSIS — Z0100 Encounter for examination of eyes and vision without abnormal findings: Secondary | ICD-10-CM

## 2023-11-01 DIAGNOSIS — Z2821 Immunization not carried out because of patient refusal: Secondary | ICD-10-CM | POA: Diagnosis not present

## 2023-11-01 DIAGNOSIS — Z0000 Encounter for general adult medical examination without abnormal findings: Secondary | ICD-10-CM | POA: Diagnosis not present

## 2023-11-01 DIAGNOSIS — E119 Type 2 diabetes mellitus without complications: Secondary | ICD-10-CM

## 2023-11-01 NOTE — Patient Instructions (Signed)
 Corey Park , Thank you for taking time out of your busy schedule to complete your Annual Wellness Visit with me. I enjoyed our conversation and look forward to speaking with you again next year. I, as well as your care team,  appreciate your ongoing commitment to your health goals. Please review the following plan we discussed and let me know if I can assist you in the future. Your Game plan/ To Do List    Referrals: If you haven't heard from the office you've been referred to, please reach out to them at the phone provided.   Follow up Visits: Next Medicare AWV with our clinical staff: 11/14/2023 at 9:50 am  Have you seen your provider in the last 6 months (3 months if uncontrolled diabetes)? Yes Next Office Visit with your provider: n/A  Clinician Recommendations:  Aim for 30 minutes of exercise or brisk walking, 6-8 glasses of water , and 5 servings of fruits and vegetables each day.       This is a list of the screening recommended for you and due dates:  Health Maintenance  Topic Date Due   Eye exam for diabetics  Never done   COVID-19 Vaccine (3 - Pfizer risk series) 11/10/2019   Complete foot exam   10/12/2022   Zoster (Shingles) Vaccine (1 of 2) 11/20/2023*   DTaP/Tdap/Td vaccine (1 - Tdap) 08/22/2024*   Pneumococcal Vaccination (1 of 2 - PCV) 08/22/2024*   Hemoglobin A1C  12/24/2023   Flu Shot  01/25/2024   Medicare Annual Wellness Visit  10/31/2024   Colon Cancer Screening  03/23/2031   Hepatitis C Screening  Completed   HIV Screening  Completed   HPV Vaccine  Aged Out   Meningitis B Vaccine  Aged Out  *Topic was postponed. The date shown is not the original due date.    Advanced directives: (Declined) Advance directive discussed with you today. Even though you declined this today, please call our office should you change your mind, and we can give you the proper paperwork for you to fill out. Advance Care Planning is important because it:  [x]  Makes sure you receive  the medical care that is consistent with your values, goals, and preferences  [x]  It provides guidance to your family and loved ones and reduces their decisional burden about whether or not they are making the right decisions based on your wishes.  Follow the link provided in your after visit summary or read over the paperwork we have mailed to you to help you started getting your Advance Directives in place. If you need assistance in completing these, please reach out to us  so that we can help you!  See attachments for Preventive Care and Fall Prevention Tips.

## 2023-11-01 NOTE — Progress Notes (Signed)
 Because this visit was a virtual/telehealth visit,  certain criteria was not obtained, such a blood pressure, CBG if applicable, and timed get up and go. Any medications not marked as "taking" were not mentioned during the medication reconciliation part of the visit. Any vitals not documented were not able to be obtained due to this being a telehealth visit or patient was unable to self-report a recent blood pressure reading due to a lack of equipment at home via telehealth. Vitals that have been documented are verbally provided by the patient.   This visit was performed by a medical professional under my direct supervision. I was immediately available for consultation/collaboration. I have reviewed and agree with the Annual Wellness Visit documentation.   Subjective:   Corey Park is a 53 y.o. who presents for a Medicare Wellness preventive visit.  Visit Complete: Virtual I connected with  Corey Park on 11/01/23 by a audio enabled telemedicine application and verified that I am speaking with the correct person using two identifiers.  Patient Location: Home  Provider Location: Home Office  I discussed the limitations of evaluation and management by telemedicine. The patient expressed understanding and agreed to proceed.  Vital Signs: Because this visit was a virtual/telehealth visit, some criteria may be missing or patient reported. Any vitals not documented were not able to be obtained and vitals that have been documented are patient reported.  VideoError- Librarian, academic were attempted between this provider and patient, however failed, due to patient having technical difficulties OR patient did not have access to video capability.  We continued and completed visit with audio only.   Persons Participating in Visit: Patient.  AWV Questionnaire: No: Patient Medicare AWV questionnaire was not completed prior to this visit.  Cardiac Risk Factors include:  advanced age (>4men, >45 women);diabetes mellitus;male gender;hypertension     Objective:     Today's Vitals   11/01/23 1243  BP: (!) 182/108  Weight: 215 lb (97.5 kg)  Height: 6\' 1"  (1.854 m)   Body mass index is 28.37 kg/m.     11/01/2023   12:50 PM 09/01/2023   10:54 AM 08/17/2023    6:28 PM 08/10/2023    8:04 PM 07/25/2023    8:36 PM 06/28/2023    6:46 AM 06/25/2023    2:01 AM  Advanced Directives  Does Patient Have a Medical Advance Directive? No No No No No No No  Would patient like information on creating a medical advance directive? No - Patient declined Yes (ED - send information to MyChart) No - Patient declined  No - Patient declined No - Patient declined No - Patient declined    Current Medications (verified) Outpatient Encounter Medications as of 11/01/2023  Medication Sig   amoxicillin -clavulanate (AUGMENTIN ) 875-125 MG tablet Take 1 tablet by mouth every 12 (twelve) hours.   aspirin  EC 81 MG tablet Take 1 tablet (81 mg total) by mouth daily. Swallow whole.   atorvastatin  (LIPITOR ) 80 MG tablet Take 1 tablet (80 mg total) by mouth daily.   calcium  acetate (PHOSLO ) 667 MG capsule Take 3 capsules (2,001 mg total) by mouth with breakfast, with lunch, and with evening meal.   clopidogrel  (PLAVIX ) 75 MG tablet Take 1 tablet (75 mg total) by mouth daily.   famotidine  (PEPCID ) 20 MG tablet Take 20 mg by mouth daily.   ferric citrate  (AURYXIA ) 1 GM 210 MG(Fe) tablet Take 2 tablets (420 mg total) by mouth daily. (Patient taking differently: Take 420 mg by mouth 3 (three)  times daily with meals.)   multivitamin (RENA-VIT) TABS tablet Take 1 tablet by mouth daily.   olmesartan  (BENICAR ) 40 MG tablet Take 0.5 tablets (20 mg total) by mouth daily.   pantoprazole  (PROTONIX ) 20 MG tablet Take 20 mg by mouth daily as needed for heartburn.   trimethoprim -polymyxin b  (POLYTRIM ) ophthalmic solution Place 1 drop into the left eye every 6 (six) hours.   Vitamin D , Ergocalciferol , (DRISDOL )  1.25 MG (50000 UNIT) CAPS capsule Take 1 capsule (50,000 Units total) by mouth every 7 (seven) days.   carvedilol  (COREG ) 12.5 MG tablet Take 2 tablets (25 mg total) by mouth 2 (two) times daily with a meal.   No facility-administered encounter medications on file as of 11/01/2023.    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   MWF at [facility]   Fe deficiency anemia 12/23/2018   Hypertension    Legally blind    B/L   Pneumonia 2021   Type 2 diabetes mellitus with diabetic peripheral angiopathy without gangrene (HCC) 08/25/2019   Unspecified protein-calorie malnutrition (HCC) 08/25/2019   Past Surgical History:  Procedure Laterality Date   BASCILIC VEIN TRANSPOSITION Right 10/16/2019   Procedure: Basilic Vein Transposition Right Arm;  Surgeon: Margherita Shell, MD;  Location: [facility] OR;  Service: Vascular;  Laterality: Right;   BASCILIC VEIN TRANSPOSITION Right 01/29/2020   Procedure: RIGHT ARM SECOND STAGE BASCILIC VEIN TRANSPOSITION;  Surgeon: Margherita Shell, MD;  Location: [facility] OR;  Service: Vascular;  Laterality: Right;   BIOPSY  12/22/2018   Procedure: BIOPSY;  Surgeon: Jolinda Necessary, MD;  Location: [facility] ENDOSCOPY;  Service: Endoscopy;;   CORONARY ARTERY BYPASS GRAFT N/A 06/28/2023   Procedure: CORONARY ARTERY BYPASS GRAFTING TIMES THREE USING LEFT INTERNAL MAMMARY ARTERY AND RIGHT GREAT SAPHENOUS VEIN HARVESTED ENDOSCOPICALLY;  Surgeon: Hilarie Lovely, MD;  Location: [facility] OR;  Service: Open Heart Surgery;  Laterality: N/A;    CORONARY ULTRASOUND/IVUS N/A 12/13/2022   Procedure: Coronary Ultrasound/IVUS;  Surgeon: Lucendia Rusk, MD;  Location: [facility] INVASIVE CV LAB;  Service: Cardiovascular;  Laterality: N/A;   ESOPHAGOGASTRODUODENOSCOPY N/A 12/22/2018   Procedure: ESOPHAGOGASTRODUODENOSCOPY (EGD);  Surgeon: Jolinda Necessary, MD;  Location: [facility] ENDOSCOPY;  Service: Endoscopy;  Laterality: N/A;   IR FLUORO GUIDE CV LINE RIGHT  07/29/2019   IR US  GUIDE VASC ACCESS RIGHT  07/29/2019   LEFT HEART CATH AND CORONARY ANGIOGRAPHY N/A 12/13/2022   Procedure: LEFT HEART CATH AND CORONARY ANGIOGRAPHY;  Surgeon: Lucendia Rusk, MD;  Location: [facility] INVASIVE CV LAB;  Service: Cardiovascular;  Laterality: N/A;   LEFT HEART CATH AND CORONARY ANGIOGRAPHY N/A 06/25/2023   Procedure: LEFT HEART CATH AND CORONARY ANGIOGRAPHY;  Surgeon: Odie Benne, MD;  Location: [facility] INVASIVE CV LAB;  Service: Cardiovascular;  Laterality: N/A;   TEE WITHOUT CARDIOVERSION N/A 06/28/2023   Procedure: TRANSESOPHAGEAL ECHOCARDIOGRAM (TEE);  Surgeon: Hilarie Lovely, MD;  Location: [facility] OR;  Service: Open Heart Surgery;  Laterality: N/A;   TRANSESOPHAGEAL ECHOCARDIOGRAM (CATH LAB) N/A 05/10/2023   Procedure: TRANSESOPHAGEAL ECHOCARDIOGRAM;  Surgeon: Harrold Lincoln,  MD;  Location: [facility] INVASIVE CV LAB;  Service: Cardiovascular;  Laterality: N/A;   Family History  Problem Relation Age of Onset   CAD Mother    Hypertension Mother    Diabetes Neg Hx    Stroke Neg Hx    Cancer Neg Hx    Kidney failure Neg Hx    Stomach cancer Neg Hx    Colon cancer Neg Hx    Rectal cancer Neg Hx    Social History   Socioeconomic History   Marital status: Single    Spouse name: Not on file   Number of children: Not on file   Years of education: Not on file   Highest education level: Not on file  Occupational History   Not on file  Tobacco Use   Smoking status: Never   Smokeless tobacco: Never  Vaping Use   Vaping status: Never Used  Substance  and Sexual Activity   Alcohol use: Not Currently   Drug use: Not Currently    Types: Marijuana   Sexual activity: Not on file  Other Topics Concern   Not on file  Social History Narrative   Not on file   Social Drivers of Health   Financial Resource Strain: Low Risk  (11/01/2023)   Overall Financial Resource Strain (CARDIA)    Difficulty of Paying Living Expenses: Not very hard  Recent Concern: Financial Resource Strain - High Risk (08/23/2023)   Overall Financial Resource Strain (CARDIA)    Difficulty of Paying Living Expenses: Hard  Food Insecurity: Food Insecurity Present (11/01/2023)   Hunger Vital Sign    Worried About Running Out of Food in the Last Year: Sometimes true    Ran Out of Food in the Last Year: Sometimes true  Transportation Needs: Unmet Transportation Needs (11/01/2023)   PRAPARE - Transportation    Lack of Transportation (Medical): Yes    Lack of Transportation (Non-Medical): Yes  Physical Activity: Insufficiently Active (11/01/2023)   Exercise Vital Sign    Days of Exercise per Week: 7 days    Minutes of Exercise per Session: 20 min  Stress: No Stress Concern Present (11/01/2023)   [employer] of Occupational Health - Occupational Stress Questionnaire    Feeling of Stress : Not at all  Social Connections: Moderately Integrated (11/01/2023)   Social Connection and Isolation Panel [NHANES]    Frequency of Communication with Friends and Family: More than three times a week    Frequency of Social Gatherings with Friends and Family: More than three times a week    Attends Religious Services: More than 4 times per year    Active Member of [employer] or Organizations: Yes    Attends Engineer, structural: More than 4 times per year    Marital Status: Never married    Tobacco Counseling Counseling given: Not Answered    Clinical Intake:  Pre-visit preparation completed: Yes  Pain : No/denies pain     BMI - recorded: 28.37 Nutritional Status: BMI 25  -29 Overweight Nutritional Risks: None Diabetes: No  Lab Results  Component Value Date   HGBA1C 5.8 (H) 06/26/2023   HGBA1C 5.6 09/12/2022   HGBA1C 4.7 (L) 08/06/2022     What is the last grade level you completed in school?: GED  Interpreter Needed?: No  Information entered by :: Donis Pinder,cma   Activities of Daily Living     11/01/2023   12:47 PM 05/09/2023    4:00 PM  In your present state of health,  do you have any difficulty performing the following activities:  Hearing? 0 0  Vision? 0 0  Difficulty concentrating or making decisions? 0 0  Walking or climbing stairs? 0   Dressing or bathing? 1   Comment patient has help he is blind   Doing errands, shopping? 1   Comment patient has help he is blind   Quarry manager and eating ? N   Using the Toilet? N   In the past six months, have you accidently leaked urine? N   Do you have problems with loss of bowel control? N   Managing your Medications? N   Managing your Finances? N   Housekeeping or managing your Housekeeping? Y   Comment patient has help he is blind     Patient Care Team: Senaida Dama, NP as PCP - General (Nurse Practitioner) Hugh Madura, MD as PCP - Cardiology (Cardiology) Center, [facility]  Indicate any recent Medical Services you may have received from other than [facility] providers in the past year (date may be approximate).     Assessment:    This is a routine wellness examination for [facility].  Hearing/Vision screen Hearing Screening - Comments:: No hearing difficulties Vision Screening - Comments:: Patient is blind   Goals Addressed             This Visit's Progress    Patient Stated       To stay healthy        Depression Screen     11/01/2023   12:51 PM 08/10/2022    1:53 PM  PHQ 2/9 Scores  PHQ - 2 Score 0 0  PHQ- 9 Score 2 0    Fall Risk     11/01/2023   12:49 PM 08/23/2023   10:19 AM 01/16/2019    2:45 PM  Fall Risk   Falls in the past year? 0 0 0   Number falls in past yr: 0 0   Injury with Fall? 0 0   Risk for fall due to : Impaired vision No Fall Risks   Risk for fall due to: Comment patient has help he is blind    Follow up Falls prevention discussed;Falls evaluation completed Falls evaluation completed     MEDICARE RISK AT HOME:  Medicare Risk at Home Any stairs in or around the home?: Yes If so, are there any without handrails?: No Home free of loose throw rugs in walkways, pet beds, electrical cords, etc?: Yes Adequate lighting in your home to reduce risk of falls?: Yes Life alert?: No Use of a cane, walker or w/c?: No Grab bars in the bathroom?: Yes Shower chair or bench in shower?: Yes Elevated toilet seat or a handicapped toilet?: Yes  TIMED UP AND GO:  Was the test performed?  No  Cognitive Function: 6CIT completed        11/01/2023   12:45 PM  6CIT Screen  What Year? 0 points  What month? 0 points  What time? 0 points  Count back from 20 0 points  Months in reverse 0 points  Repeat phrase 0 points  Total Score 0 points    Immunizations Immunization History  Administered Date(s) Administered   Hepatitis B, ADULT 10/10/2019, 11/05/2019   Hepb-cpg 12/03/2019, 04/07/2020   Influenza Whole 06/11/2007   PFIZER(Purple Top)SARS-COV-2 Vaccination 09/15/2019, 10/13/2019    Screening Tests Health Maintenance  Topic Date Due   OPHTHALMOLOGY EXAM  Never done   COVID-19 Vaccine (3 - Pfizer risk series) 11/10/2019  FOOT EXAM  10/12/2022   Zoster Vaccines- Shingrix (1 of 2) 11/20/2023 (Originally 01/22/1990)   DTaP/Tdap/Td (1 - Tdap) 08/22/2024 (Originally [birth year]22/1990)   Pneumococcal Vaccine 69-58 Years old (1 of 2 - PCV) 08/22/2024 (Originally [birth year]1990)   HEMOGLOBIN A1C  12/24/2023   INFLUENZA VACCINE  01/25/2024   Medicare Annual Wellness (AWV)  10/31/2024   Colonoscopy  03/23/2031   Hepatitis C Screening  Completed   HIV Screening  Completed   HPV VACCINES  Aged Out   Meningococcal B Vaccine  Aged  Out    Health Maintenance  Health Maintenance Due  Topic Date Due   OPHTHALMOLOGY EXAM  Never done   COVID-19 Vaccine (3 - Pfizer risk series) 11/10/2019   FOOT EXAM  10/12/2022   Health Maintenance Items Addressed: Referral sent to Optometry/Ophthalmology, Diabetic Foot Exam scheduled  Additional Screening:  Vision Screening: Recommended annual ophthalmology exams for early detection of glaucoma and other disorders of the eye.  Dental Screening: Recommended annual dental exams for proper oral hygiene  Community Resource Referral / Chronic Care Management: CRR required this visit?  No   CCM required this visit?  No   Plan:    I have personally reviewed and noted the following in the patient's chart:   Medical and social history Use of alcohol, tobacco or illicit drugs  Current medications and supplements including opioid prescriptions. Patient is not currently taking opioid prescriptions. Functional ability and status Nutritional status Physical activity Advanced directives List of other physicians Hospitalizations, surgeries, and ER visits in previous 12 months Vitals Screenings to include cognitive, depression, and falls Referrals and appointments  In addition, I have reviewed and discussed with patient certain preventive protocols, quality metrics, and best practice recommendations. A written personalized care plan for preventive services as well as general preventive health recommendations were provided to patient.   Freeda Jerry, [place]   11/01/2023   After Visit Summary: (MyChart) Due to this being a telephonic visit, the after visit summary with patients personalized plan was offered to patient via MyChart   Notes: Nothing significant to report at this time.

## 2023-11-20 ENCOUNTER — Encounter (HOSPITAL_COMMUNITY): Payer: Self-pay | Admitting: Emergency Medicine

## 2023-11-20 ENCOUNTER — Other Ambulatory Visit: Payer: Self-pay

## 2023-11-20 ENCOUNTER — Emergency Department (HOSPITAL_COMMUNITY): Admission: EM | Admit: 2023-11-20 | Discharge: 2023-11-20 | Disposition: A | Discharge status: Home / Self Care

## 2023-11-20 ENCOUNTER — Emergency Department (HOSPITAL_COMMUNITY)

## 2023-11-20 DIAGNOSIS — R0789 Other chest pain: Secondary | ICD-10-CM | POA: Diagnosis not present

## 2023-11-20 DIAGNOSIS — Z79899 Other long term (current) drug therapy: Secondary | ICD-10-CM | POA: Diagnosis not present

## 2023-11-20 DIAGNOSIS — Z7982 Long term (current) use of aspirin: Secondary | ICD-10-CM | POA: Diagnosis not present

## 2023-11-20 DIAGNOSIS — R7989 Other specified abnormal findings of blood chemistry: Secondary | ICD-10-CM | POA: Insufficient documentation

## 2023-11-20 DIAGNOSIS — Z992 Dependence on renal dialysis: Secondary | ICD-10-CM | POA: Diagnosis not present

## 2023-11-20 DIAGNOSIS — E1122 Type 2 diabetes mellitus with diabetic chronic kidney disease: Secondary | ICD-10-CM | POA: Insufficient documentation

## 2023-11-20 DIAGNOSIS — I132 Hypertensive heart and chronic kidney disease with heart failure and with stage 5 chronic kidney disease, or end stage renal disease: Secondary | ICD-10-CM | POA: Diagnosis not present

## 2023-11-20 DIAGNOSIS — R079 Chest pain, unspecified: Secondary | ICD-10-CM | POA: Diagnosis present

## 2023-11-20 DIAGNOSIS — N186 End stage renal disease: Secondary | ICD-10-CM | POA: Diagnosis not present

## 2023-11-20 DIAGNOSIS — I509 Heart failure, unspecified: Secondary | ICD-10-CM | POA: Diagnosis not present

## 2023-11-20 LAB — BASIC METABOLIC PANEL WITH GFR
Anion gap: 22 — ABNORMAL HIGH (ref 5–15)
BUN: 80 mg/dL — ABNORMAL HIGH (ref 6–20)
CO2: 23 mmol/L (ref 22–32)
Calcium: 8.9 mg/dL (ref 8.9–10.3)
Chloride: 96 mmol/L — ABNORMAL LOW (ref 98–111)
Creatinine, Ser: 14.95 mg/dL — ABNORMAL HIGH (ref 0.61–1.24)
GFR, Estimated: 4 mL/min — ABNORMAL LOW (ref >60)
Glucose, Bld: 97 mg/dL (ref 70–99)
Potassium: 4.5 mmol/L (ref 3.5–5.1)
Sodium: 141 mmol/L (ref 135–145)

## 2023-11-20 LAB — CBC
HCT: 36.1 % — ABNORMAL LOW (ref 39.0–52.0)
Hemoglobin: 12 g/dL — ABNORMAL LOW (ref 13.0–17.0)
MCH: 30.7 pg (ref 26.0–34.0)
MCHC: 33.2 g/dL (ref 30.0–36.0)
MCV: 92.3 fL (ref 80.0–100.0)
Platelets: 132 10*3/uL — ABNORMAL LOW (ref 150–400)
RBC: 3.91 MIL/uL — ABNORMAL LOW (ref 4.22–5.81)
RDW: 16.3 % — ABNORMAL HIGH (ref 11.5–15.5)
WBC: 8.7 10*3/uL (ref 4.0–10.5)
nRBC: 0 % (ref 0.0–0.2)

## 2023-11-20 LAB — TROPONIN I (HIGH SENSITIVITY)
Troponin I (High Sensitivity): 123 ng/L (ref <18)
Troponin I (High Sensitivity): 111 ng/L (ref <18)

## 2023-11-20 NOTE — ED Notes (Signed)
 EDP at bedside

## 2023-11-20 NOTE — Discharge Instructions (Signed)
 Please take your blood pressure medications at home.  Please go to your dialysis session tomorrow.  Return immediately if develop worsening shortness of breath or difficulty breathing.  Worsening chest pain or you pass out, feel your heart is racing.  You may also return if develop any new or worsening symptoms that are concerning to you.

## 2023-11-20 NOTE — ED Triage Notes (Signed)
 Patient BIB [facility] c/o right sided chest pain that started while he was at work.  Patient also missed dialysis yesterday, last done on Friday, had full session. Patient also endorses sob. EMS activated STEMI, deactivated by cardiology.   324 mg asa 160/80 106 CBG 18 L AC

## 2023-11-20 NOTE — ED Provider Notes (Signed)
 [place] [facility]PARTMENT AT [facility] Provider Note   CSN: [id number] Arrival date & time: 11/20/23  1613     History  Chief Complaint  Patient presents with   Chest Pain    Corey Park is a 53 y.o. male.  Right-sided chest pain started roughly 1 hour ago while at work sitting at his workstation.  Has had shortness of breath for the past several days.  Chest pain sharp.  Worse with deep breath.  He is ESRD last dialysis Friday, missed his dialysis yesterday.  Received aspirin  prior to arrival.  Denies URI symptoms.   Chest Pain      Home Medications Prior to Admission medications   Medication Sig Start Date End Date Taking? Authorizing Provider  aspirin  EC 81 MG tablet Take 1 tablet (81 mg total) by mouth daily. Swallow whole. 07/08/23  Yes Gold, Wayne E, PA-C  atorvastatin  (LIPITOR ) 80 MG tablet Take 1 tablet (80 mg total) by mouth daily. 08/13/23  Yes Samtani, Jai-Gurmukh, MD  calcium  acetate (PHOSLO ) 667 MG capsule Take 3 capsules (2,001 mg total) by mouth with breakfast, with lunch, and with evening meal. 07/08/23  Yes Gold, Wayne E, PA-C  carvedilol  (COREG ) 25 MG tablet Take 25 mg by mouth 2 (two) times daily with a meal.   Yes [provider]  clopidogrel  (PLAVIX ) 75 MG tablet Take 1 tablet (75 mg total) by mouth daily. 07/08/23  Yes Gold, Wayne E, PA-C  famotidine  (PEPCID ) 20 MG tablet Take 20 mg by mouth daily. 04/20/23  Yes [provider]  ferric citrate  (AURYXIA ) 1 GM 210 MG(Fe) tablet Take 2 tablets (420 mg total) by mouth daily. Patient taking differently: Take 420 mg by mouth 3 (three) times daily with meals. 12/14/22  Yes Jonelle Neri, DO  multivitamin (RENA-VIT) TABS tablet Take 1 tablet by mouth daily. 11/22/22  Yes [provider]  pantoprazole  (PROTONIX ) 20 MG tablet Take 20 mg by mouth daily as needed for heartburn. 04/19/23  Yes [provider]  Vitamin D , Ergocalciferol , (DRISDOL ) 1.25 MG (50000 UNIT) CAPS  capsule Take 1 capsule (50,000 Units total) by mouth every 7 (seven) days. 08/19/23  Yes Samtani, Jai-Gurmukh, MD  amoxicillin -clavulanate (AUGMENTIN ) 875-125 MG tablet Take 1 tablet by mouth every 12 (twelve) hours. Patient not taking: Reported on 11/20/2023 09/05/23   Albertus Hughs, DO  carvedilol  (COREG ) 12.5 MG tablet Take 2 tablets (25 mg total) by mouth 2 (two) times daily with a meal. Patient not taking: Reported on 11/20/2023 09/01/23 10/01/23  Kingsley, Victoria K, DO  olmesartan  (BENICAR ) 40 MG tablet Take 0.5 tablets (20 mg total) by mouth daily. Patient not taking: Reported on 11/20/2023 09/01/23   Kingsley, Victoria K, DO  trimethoprim -polymyxin b  (POLYTRIM ) ophthalmic solution Place 1 drop into the left eye every 6 (six) hours. Patient not taking: Reported on 11/20/2023 08/13/23   Samtani, Jai-Gurmukh, MD      Allergies    Gabapentin , Oxycodone , and Imdur  [isosorbide  nitrate]    Review of Systems   Review of Systems  Cardiovascular:  Positive for chest pain.    Physical Exam Updated Vital Signs BP (!) 162/89 (BP Location: Right Arm)   Pulse 84   Temp 98.1 F (36.7 C) (Oral)   Resp 17   Ht 6\' 1"  (1.854 m)   Wt 97.5 kg   SpO2 95%   BMI 28.37 kg/m  Physical Exam Vitals and nursing note reviewed.  Constitutional:      Appearance: He is obese.  Cardiovascular:  Rate and Rhythm: Normal rate and regular rhythm.     Pulses:          Radial pulses are 2+ on the right side and 2+ on the left side.       Posterior tibial pulses are 2+ on the right side and 2+ on the left side.     Heart sounds: Normal heart sounds.  Pulmonary:     Effort: Pulmonary effort is normal.     Breath sounds: Rhonchi present.  Chest:     Chest wall: Tenderness present.  Musculoskeletal:     Right lower leg: No edema.     Left lower leg: No edema.  Skin:    General: Skin is warm.     Capillary Refill: Capillary refill takes less than 2 seconds.  Neurological:     General: No focal deficit present.      Mental Status: He is alert.     ED Results / Procedures / Treatments   Labs (all labs ordered are listed, but only abnormal results are displayed) Labs Reviewed  BASIC METABOLIC PANEL WITH GFR - Abnormal; Notable for the following components:      Result Value   Chloride 96 (*)    BUN 80 (*)    Creatinine, Ser 14.95 (*)    GFR, Estimated 4 (*)    Anion gap 22 (*)    All other components within normal limits  CBC - Abnormal; Notable for the following components:   RBC 3.91 (*)    Hemoglobin 12.0 (*)    HCT 36.1 (*)    RDW 16.3 (*)    Platelets 132 (*)    All other components within normal limits  TROPONIN I (HIGH SENSITIVITY) - Abnormal; Notable for the following components:   Troponin I (High Sensitivity) 123 (*)    All other components within normal limits  TROPONIN I (HIGH SENSITIVITY) - Abnormal; Notable for the following components:   Troponin I (High Sensitivity) 111 (*)    All other components within normal limits    EKG EKG Interpretation Date/Time:  Tuesday Nov 20 2023 16:19:53 EDT Ventricular Rate:  83 PR Interval:  184 QRS Duration:  118 QT Interval:  443 QTC Calculation: 521 R Axis:   -47  Text Interpretation: Sinus rhythm Incomplete RBBB and LAFB Probable left ventricular hypertrophy Abnormal T, consider ischemia, lateral leads ST elev, probable normal early repol pattern Confirmed by Elise Guile [phone]) on 11/20/2023 4:22:35 PM  Radiology DG Chest Port 1 View Result Date: 11/20/2023 CLINICAL DATA:  Right-sided chest pain EXAM: PORTABLE CHEST 1 VIEW COMPARISON:  08/17/2023, 06/01/2023, CT chest 06/01/2023 FINDINGS: Sternotomy. Cardiomegaly. No acute airspace disease. Chronic blunting of the CP angles IMPRESSION: No active disease. Cardiomegaly. Electronically Signed   By: Esmeralda Hedge M.D.   On: 11/20/2023 17:57    Procedures Procedures    Medications Ordered in ED Medications - No data to display  ED Course/ Medical Decision Making/  A&P Clinical Course as of 11/20/23 2032  Tue Nov 20, 2023  1705 DG Chest [place] 1 View Cardiomegaly with some vascular congestion. [TY]  1943 Cath 06/25/23:  Prox RCA lesion is 80% stenosed.   Ost Cx to Mid Cx lesion is 99% stenosed.   Prox LAD lesion is 95% stenosed.   Mid LAD lesion is 50% stenosed.   Mid LM to Ost LAD lesion is 50% stenosed.   Severe three vessel CAD Moderate distal left main stenosis Severe, heavily calcified proximal LAD stenosis Severe  stenosis (likely ulcerated plaque) in the ostial/proximal Circumflex artery Severe stenosis in the proximal RCA (Moderate caliber co-dominant vessel) Elevated LV filling pressures   Recommendations: I would recommend surgical revascularization with CABG in this diabetic male with multi-vessel CAD, ESRD on HD. Will consult CT surgery. Resume IV heparin  2 hours post sheath removal.     [TY]  1944 Per review of chart s/p CABG X 3 (LIMA LAD, RSVG OM, and RSVG to distal RCA) with endoscopic greater saphenous vein harvest on the right by Dr. Deloise Ferries on 06/28/2023.   [TY]  1949 Case discussed with cardiology; troponin elevation likely secondary to his ESRD and missing dialysis.  Nothing to do from a cardiology standpoint.  No need to admit given recent CABG.  Can follow-up outpatient. [TY]  2014 Spoke with nephrology, do not think that patient needs emergent dialysis and would likely not get dialysis today/tonight if he were to stay here anyways and we would better served to go to his outpatient dialysis tomorrow. [TY]    Clinical Course User Index [TY] Rolinda Climes, DO                                 Medical Decision Making 53 year old male complicated past medical history include diabetes, hypertension, CHF, ESRD on dialysis, presenting emergency department for chest pain.  Missed dialysis yesterday.  EMS reported giving aspirin  prior to arrival they noted some elevation in V3 V4.  EKG on arrival with LVH type pattern similar to  prior.  No STEMI.  He does sound quite rhonchorous on exam; suspect secondary to missing dialysis yesterday.  Cardiac workup ordered.  Update.  Elevated troponin, but similar in the past, slightly downtrending.  EKG similar to prior.  Findings discussed with cardiology who recommends outpatient follow-up given recent CABG. electrolytes without significant abnormality.  Elevated BUN and creatinine.  He is slightly hypertensive, but not having pulmonary edema on chest x-ray.  Not requiring oxygen.  Discussed case with nephrology; see ED course, recommending he go to his normal dialysis tomorrow.  Patient's symptoms have improved since being here in the emergency department.  Will discharge in stable condition.  Strict return precautions given.  Amount and/or Complexity of Data Reviewed External Data Reviewed: ECG.    Details: EKG similar to prior.  Last echo in January with mildly depressed EF 40 to 45%. Labs: ordered. Decision-making details documented in ED Course. Radiology: ordered and independent interpretation performed. Decision-making details documented in ED Course. ECG/medicine tests: independent interpretation performed.    Details: See above  Risk Decision regarding hospitalization. Diagnosis or treatment significantly limited by social determinants of health. Risk Details: Poor health literacy.         Final Clinical Impression(s) / ED Diagnoses Final diagnoses:  None    Rx / DC Orders ED Discharge Orders     None         Rolinda Climes, DO 11/20/23 2032

## 2023-12-11 NOTE — Progress Notes (Deleted)
   Cardiology Office Note    Date:  12/11/2023  ID:  Corey Park, DOB 08/31/1970, MRN [id number] PCP:  Senaida Dama, NP  Cardiologist:  Dorothye Gathers, MD  Electrophysiologist:  None   Chief Complaint: ***  History of Present Illness: .    Corey Park is a 53 y.o. male with visit-pertinent history of CAD with NSTEMI s/p CABG 06/2023, prior chronic HFrEF felt due to hypertensive cardiomyopathy with prior pulmonary HTN, DM with retinopathy, ESRD on HD, mild anemia/thrombocytopenia, HTN, HLD seen for 1 year follow-up per schedule, though did not have post-hospital follow-up after 06/2023 admission for CABG.   The patient has had several admissions for decompensated heart failure. He was seen by cardiology in June 2018 during admission with lapses in outpatient follow-up. He was then readmitted in 2020 with EF 25-30%, also seen by nephrology with plans to pursue vascular access. This was felt due to poorly controlled BP. He later went on to start HD. Follow-up thereafter was spotty. 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 2D echo showed EF 40-45%, G1DD. Carotid duplex showed 1-39% LICA with stenotic right subclavian with right vertebral retrograde flow. Did not attend post-hospital f/u though has had several other medical encounters. CTA 07/26/23 showed small amount of low-density fluid in the anterior mediastinum superiorly measuring up to 15 mm in thickness. This is nonenhancing and may represent postoperative seroma or hematoma. He was admitted 07/2023 for hallucinations as well as left eye pain. It was felt he had toxic encephalopathy from gabapentin  and opiates He was to avoid further opiates and gabapentin  and was given Polytrim  for eye. He did see CT surgery 08/22/23 who did not indicate any additional testing was needed for the CT findings   Subclavian/retrograde -> vasc surgery Abnl cbc Whos got lipids  CAD Chronic HFrEF with pulmonary  HTN Essential HTN HLD    Labwork independently reviewed: Troponins appear chronically up 10/2023 Hgb 12, plt 132, K 4.5, Cr 14.95 07/2023 Mg, TSH OK, alb 3.5, AST ALT OK 11/2022 LDL 107, trig 111  ROS: .    Please see the history of present illness. Otherwise, review of systems is positive for ***.  All other systems are reviewed and otherwise negative.  Studies Reviewed: Aaron Aas    EKG:  EKG is ordered today, personally reviewed, demonstrating ***  CV Studies: Cardiac studies reviewed are outlined and summarized above. Otherwise please see EMR for full report.   Current Reported Medications:.    No outpatient medications have been marked as taking for the 12/12/23 encounter (Appointment) with Leilene Diprima N, PA-C.    Physical Exam:    VS:  There were no vitals taken for this visit.   Wt Readings from Last 3 Encounters:  11/20/23 215 lb (97.5 kg)  11/01/23 215 lb (97.5 kg)  09/05/23 200 lb 9.9 oz (91 kg)    GEN: Well nourished, well developed in no acute distress NECK: No JVD; No carotid bruits CARDIAC: ***RRR, no murmurs, rubs, gallops RESPIRATORY:  Clear to auscultation without rales, wheezing or rhonchi  ABDOMEN: Soft, non-tender, non-distended EXTREMITIES:  No edema; No acute deformity   Asessement and Plan:.     ***     Disposition: F/u with ***  Signed, Allyna Pittsley N Aeson Sawyers, PA-C

## 2023-12-12 ENCOUNTER — Ambulatory Visit: Admitting: Physician Assistant

## 2023-12-19 ENCOUNTER — Telehealth: Payer: Self-pay | Admitting: Family

## 2023-12-19 NOTE — Telephone Encounter (Unsigned)
 Copied from CRM [id number]. 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 Patient is asking to talk with PCP to start the process for the program/ paperwork and assessment. Please call back [phone].

## 2023-12-19 NOTE — Telephone Encounter (Signed)
 Slater, will you please assist patient and/or advise on next steps. Thank you.

## 2023-12-25 NOTE — Progress Notes (Deleted)
   Cardiology Office Note    Date:  12/25/2023  ID:  Kempton Milne, DOB 1970-07-14, MRN [id number] PCP:  Lorren Greig PARAS, NP  Cardiologist:  Oneil Parchment, MD  Electrophysiologist:  None   Chief Complaint: ***  History of Present Illness: .    Tynan Boesel is a 53 y.o. male with visit-pertinent history of CAD with NSTEMI s/p CABG 06/2023, prior chronic HFrEF felt due to hypertensive cardiomyopathy with prior pulmonary HTN, DM with retinopathy, ESRD on HD, mild anemia/thrombocytopenia, HTN, HLD seen for 1 year follow-up per schedule, though did not have post-hospital follow-up after 06/2023 admission for CABG.   The patient has had several admissions for decompensated heart failure. He was seen by cardiology in June 2018 during admission with lapses in outpatient follow-up. He was then readmitted in 2020 with EF 25-30%, also seen by nephrology with plans to pursue vascular access. This was felt due to poorly controlled BP. He later went on to start HD. Follow-up thereafter was spotty. 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 2D echo showed EF 40-45%, G1DD. Carotid duplex showed 1-39% LICA with stenotic right subclavian with right vertebral retrograde flow. Did not attend post-hospital f/u though has had several other medical encounters. CTA 07/26/23 showed small amount of low-density fluid in the anterior mediastinum superiorly measuring up to 15 mm in thickness. This is nonenhancing and may represent postoperative seroma or hematoma. He was admitted 07/2023 for hallucinations as well as left eye pain. It was felt he had toxic encephalopathy from gabapentin  and opiates He was to avoid further opiates and gabapentin  and was given Polytrim  for eye. He did see CT surgery 08/22/23 who did not indicate any additional testing was needed for the CT findings.   Subclavian/retrograde -> vasc surgery Abnl cbc Whos got lipids  CAD Chronic HFrEF with pulmonary  HTN Essential HTN HLD  Labwork independently reviewed: Troponins appear chronically up 10/2023 Hgb 12, plt 132, K 4.5, Cr 14.95 07/2023 Mg, TSH OK, alb 3.5, AST ALT OK 11/2022 LDL 107, trig 111  ROS: .    Please see the history of present illness. Otherwise, review of systems is positive for ***.  All other systems are reviewed and otherwise negative.  Studies Reviewed: SABRA    EKG:  EKG is ordered today, personally reviewed, demonstrating ***  CV Studies: Cardiac studies reviewed are outlined and summarized above. Otherwise please see EMR for full report.   Current Reported Medications:.    No outpatient medications have been marked as taking for the 12/26/23 encounter (Appointment) with Jermiah Howton N, PA-C.    Physical Exam:    VS:  There were no vitals taken for this visit.   Wt Readings from Last 3 Encounters:  11/20/23 215 lb (97.5 kg)  11/01/23 215 lb (97.5 kg)  09/05/23 200 lb 9.9 oz (91 kg)    GEN: Well nourished, well developed in no acute distress NECK: No JVD; No carotid bruits CARDIAC: ***RRR, no murmurs, rubs, gallops RESPIRATORY:  Clear to auscultation without rales, wheezing or rhonchi  ABDOMEN: Soft, non-tender, non-distended EXTREMITIES:  No edema; No acute deformity   Asessement and Plan:.     ***     Disposition: F/u with ***  Signed, Demri Poulton N Crystel Demarco, PA-C

## 2023-12-25 NOTE — Telephone Encounter (Signed)
 Noted

## 2023-12-26 ENCOUNTER — Ambulatory Visit: Attending: Cardiology | Admitting: Physician Assistant

## 2023-12-26 DIAGNOSIS — I10 Essential (primary) hypertension: Secondary | ICD-10-CM

## 2023-12-26 DIAGNOSIS — I5022 Chronic systolic (congestive) heart failure: Secondary | ICD-10-CM

## 2023-12-26 DIAGNOSIS — E785 Hyperlipidemia, unspecified: Secondary | ICD-10-CM

## 2023-12-26 DIAGNOSIS — I2510 Atherosclerotic heart disease of native coronary artery without angina pectoris: Secondary | ICD-10-CM

## 2023-12-26 DIAGNOSIS — I272 Pulmonary hypertension, unspecified: Secondary | ICD-10-CM

## 2024-02-11 ENCOUNTER — Inpatient Hospital Stay (HOSPITAL_COMMUNITY)
Admission: EM | Admit: 2024-02-11 | Discharge: 2024-02-14 | DRG: 640 | Disposition: A | Discharge status: Home Health | Attending: Internal Medicine | Admitting: Internal Medicine

## 2024-02-11 ENCOUNTER — Other Ambulatory Visit: Payer: Self-pay

## 2024-02-11 ENCOUNTER — Emergency Department (HOSPITAL_COMMUNITY)

## 2024-02-11 ENCOUNTER — Encounter (HOSPITAL_COMMUNITY): Payer: Self-pay

## 2024-02-11 DIAGNOSIS — I5022 Chronic systolic (congestive) heart failure: Secondary | ICD-10-CM | POA: Diagnosis not present

## 2024-02-11 DIAGNOSIS — E669 Obesity, unspecified: Secondary | ICD-10-CM | POA: Diagnosis present

## 2024-02-11 DIAGNOSIS — E119 Type 2 diabetes mellitus without complications: Secondary | ICD-10-CM

## 2024-02-11 DIAGNOSIS — D631 Anemia in chronic kidney disease: Secondary | ICD-10-CM | POA: Diagnosis present

## 2024-02-11 DIAGNOSIS — I2510 Atherosclerotic heart disease of native coronary artery without angina pectoris: Secondary | ICD-10-CM | POA: Diagnosis present

## 2024-02-11 DIAGNOSIS — E875 Hyperkalemia: Secondary | ICD-10-CM | POA: Diagnosis present

## 2024-02-11 DIAGNOSIS — R0789 Other chest pain: Secondary | ICD-10-CM

## 2024-02-11 DIAGNOSIS — E8770 Fluid overload, unspecified: Secondary | ICD-10-CM | POA: Diagnosis not present

## 2024-02-11 DIAGNOSIS — Z5986 Financial insecurity: Secondary | ICD-10-CM

## 2024-02-11 DIAGNOSIS — Z8249 Family history of ischemic heart disease and other diseases of the circulatory system: Secondary | ICD-10-CM

## 2024-02-11 DIAGNOSIS — H543 Unqualified visual loss, both eyes: Secondary | ICD-10-CM | POA: Diagnosis present

## 2024-02-11 DIAGNOSIS — K219 Gastro-esophageal reflux disease without esophagitis: Secondary | ICD-10-CM | POA: Diagnosis present

## 2024-02-11 DIAGNOSIS — Z7982 Long term (current) use of aspirin: Secondary | ICD-10-CM

## 2024-02-11 DIAGNOSIS — N186 End stage renal disease: Secondary | ICD-10-CM | POA: Diagnosis present

## 2024-02-11 DIAGNOSIS — I519 Heart disease, unspecified: Secondary | ICD-10-CM | POA: Diagnosis not present

## 2024-02-11 DIAGNOSIS — E11319 Type 2 diabetes mellitus with unspecified diabetic retinopathy without macular edema: Secondary | ICD-10-CM | POA: Diagnosis present

## 2024-02-11 DIAGNOSIS — Z5941 Food insecurity: Secondary | ICD-10-CM

## 2024-02-11 DIAGNOSIS — Z7902 Long term (current) use of antithrombotics/antiplatelets: Secondary | ICD-10-CM

## 2024-02-11 DIAGNOSIS — H409 Unspecified glaucoma: Secondary | ICD-10-CM

## 2024-02-11 DIAGNOSIS — I420 Dilated cardiomyopathy: Secondary | ICD-10-CM | POA: Diagnosis present

## 2024-02-11 DIAGNOSIS — I5042 Chronic combined systolic (congestive) and diastolic (congestive) heart failure: Secondary | ICD-10-CM | POA: Diagnosis present

## 2024-02-11 DIAGNOSIS — Z992 Dependence on renal dialysis: Secondary | ICD-10-CM

## 2024-02-11 DIAGNOSIS — Z79899 Other long term (current) drug therapy: Secondary | ICD-10-CM

## 2024-02-11 DIAGNOSIS — E66811 Obesity, class 1: Secondary | ICD-10-CM | POA: Diagnosis present

## 2024-02-11 DIAGNOSIS — Z6830 Body mass index (BMI) 30.0-30.9, adult: Secondary | ICD-10-CM

## 2024-02-11 DIAGNOSIS — R079 Chest pain, unspecified: Principal | ICD-10-CM | POA: Diagnosis present

## 2024-02-11 DIAGNOSIS — I10 Essential (primary) hypertension: Secondary | ICD-10-CM | POA: Diagnosis present

## 2024-02-11 DIAGNOSIS — Z951 Presence of aortocoronary bypass graft: Secondary | ICD-10-CM | POA: Diagnosis not present

## 2024-02-11 DIAGNOSIS — Z5982 Transportation insecurity: Secondary | ICD-10-CM

## 2024-02-11 DIAGNOSIS — Z888 Allergy status to other drugs, medicaments and biological substances status: Secondary | ICD-10-CM

## 2024-02-11 DIAGNOSIS — N2581 Secondary hyperparathyroidism of renal origin: Secondary | ICD-10-CM | POA: Diagnosis present

## 2024-02-11 DIAGNOSIS — E1151 Type 2 diabetes mellitus with diabetic peripheral angiopathy without gangrene: Secondary | ICD-10-CM | POA: Diagnosis present

## 2024-02-11 DIAGNOSIS — J45909 Unspecified asthma, uncomplicated: Secondary | ICD-10-CM | POA: Diagnosis present

## 2024-02-11 DIAGNOSIS — I252 Old myocardial infarction: Secondary | ICD-10-CM

## 2024-02-11 DIAGNOSIS — H548 Legal blindness, as defined in USA: Secondary | ICD-10-CM | POA: Diagnosis present

## 2024-02-11 DIAGNOSIS — D649 Anemia, unspecified: Secondary | ICD-10-CM | POA: Diagnosis present

## 2024-02-11 DIAGNOSIS — E785 Hyperlipidemia, unspecified: Secondary | ICD-10-CM | POA: Diagnosis present

## 2024-02-11 DIAGNOSIS — H4052X0 Glaucoma secondary to other eye disorders, left eye, stage unspecified: Secondary | ICD-10-CM | POA: Diagnosis present

## 2024-02-11 DIAGNOSIS — I132 Hypertensive heart and chronic kidney disease with heart failure and with stage 5 chronic kidney disease, or end stage renal disease: Secondary | ICD-10-CM | POA: Diagnosis present

## 2024-02-11 DIAGNOSIS — Z8616 Personal history of COVID-19: Secondary | ICD-10-CM

## 2024-02-11 DIAGNOSIS — E1122 Type 2 diabetes mellitus with diabetic chronic kidney disease: Secondary | ICD-10-CM | POA: Diagnosis present

## 2024-02-11 DIAGNOSIS — Z885 Allergy status to narcotic agent status: Secondary | ICD-10-CM

## 2024-02-11 DIAGNOSIS — I5030 Unspecified diastolic (congestive) heart failure: Secondary | ICD-10-CM | POA: Diagnosis present

## 2024-02-11 LAB — CBC
HCT: 31.5 % — ABNORMAL LOW (ref 39.0–52.0)
Hemoglobin: 10.7 g/dL — ABNORMAL LOW (ref 13.0–17.0)
MCH: 33.4 pg (ref 26.0–34.0)
MCHC: 34 g/dL (ref 30.0–36.0)
MCV: 98.4 fL (ref 80.0–100.0)
Platelets: 170 K/uL (ref 150–400)
RBC: 3.2 MIL/uL — ABNORMAL LOW (ref 4.22–5.81)
RDW: 15.2 % (ref 11.5–15.5)
WBC: 7.7 K/uL (ref 4.0–10.5)
nRBC: 0 % (ref 0.0–0.2)

## 2024-02-11 LAB — HEPATIC FUNCTION PANEL
ALT: 16 U/L (ref 0–44)
AST: 20 U/L (ref 15–41)
Albumin: 3.5 g/dL (ref 3.5–5.0)
Alkaline Phosphatase: 72 U/L (ref 38–126)
Bilirubin, Direct: 0.2 mg/dL (ref 0.0–0.2)
Indirect Bilirubin: 0.8 mg/dL (ref 0.3–0.9)
Total Bilirubin: 1 mg/dL (ref 0.0–1.2)
Total Protein: 8.1 g/dL (ref 6.5–8.1)

## 2024-02-11 LAB — BASIC METABOLIC PANEL WITH GFR
Anion gap: 19 — ABNORMAL HIGH (ref 5–15)
BUN: 75 mg/dL — ABNORMAL HIGH (ref 6–20)
CO2: 20 mmol/L — ABNORMAL LOW (ref 22–32)
Calcium: 9.1 mg/dL (ref 8.9–10.3)
Chloride: 100 mmol/L (ref 98–111)
Creatinine, Ser: 17 mg/dL — ABNORMAL HIGH (ref 0.61–1.24)
GFR, Estimated: 3 mL/min — ABNORMAL LOW (ref >60)
Glucose, Bld: 126 mg/dL — ABNORMAL HIGH (ref 70–99)
Potassium: 5.9 mmol/L — ABNORMAL HIGH (ref 3.5–5.1)
Sodium: 139 mmol/L (ref 135–145)

## 2024-02-11 LAB — GLUCOSE, CAPILLARY: Glucose-Capillary: 124 mg/dL — ABNORMAL HIGH (ref 70–99)

## 2024-02-11 LAB — TROPONIN I (HIGH SENSITIVITY)
Troponin I (High Sensitivity): 115 ng/L (ref <18)
Troponin I (High Sensitivity): 100 ng/L (ref <18)
Troponin I (High Sensitivity): 95 ng/L — ABNORMAL HIGH (ref <18)
Troponin I (High Sensitivity): 94 ng/L — ABNORMAL HIGH (ref <18)

## 2024-02-11 LAB — CBG MONITORING, ED: Glucose-Capillary: 103 mg/dL — ABNORMAL HIGH (ref 70–99)

## 2024-02-11 LAB — LIPASE, BLOOD: Lipase: 81 U/L — ABNORMAL HIGH (ref 11–51)

## 2024-02-11 LAB — HEPATITIS B SURFACE ANTIGEN: Hepatitis B Surface Ag: NONREACTIVE

## 2024-02-11 LAB — MRSA NEXT GEN BY PCR, NASAL: MRSA by PCR Next Gen: NOT DETECTED

## 2024-02-11 LAB — BRAIN NATRIURETIC PEPTIDE: B Natriuretic Peptide: 1037 pg/mL — ABNORMAL HIGH (ref 0.0–100.0)

## 2024-02-11 MED ORDER — LATANOPROST 0.005 % OP SOLN
1.0000 [drp] | Freq: Every day | OPHTHALMIC | Status: DC
  Administered 2024-02-11 – 2024-02-14 (×4): 1 [drp] via OPHTHALMIC
  Filled 2024-02-11: qty 2.5

## 2024-02-11 MED ORDER — HYDROCODONE-ACETAMINOPHEN 5-325 MG PO TABS
1.0000 | ORAL_TABLET | Freq: Four times a day (QID) | ORAL | Status: DC | PRN
  Administered 2024-02-11 – 2024-02-12 (×2): 1 via ORAL
  Filled 2024-02-11 (×2): qty 1

## 2024-02-11 MED ORDER — HYDROMORPHONE HCL 1 MG/ML IJ SOLN
0.5000 mg | Freq: Once | INTRAMUSCULAR | Status: AC
  Administered 2024-02-11: 0.5 mg via INTRAVENOUS
  Filled 2024-02-11: qty 1

## 2024-02-11 MED ORDER — PENTAFLUOROPROP-TETRAFLUOROETH EX AERO: 1.0000 | INHALATION_SPRAY | CUTANEOUS | Status: DC | PRN

## 2024-02-11 MED ORDER — CARVEDILOL 25 MG PO TABS
25.0000 mg | ORAL_TABLET | Freq: Two times a day (BID) | ORAL | Status: DC
  Administered 2024-02-11 – 2024-02-14 (×6): 25 mg via ORAL
  Filled 2024-02-11 (×5): qty 1
  Filled 2024-02-11: qty 2

## 2024-02-11 MED ORDER — LIDOCAINE-PRILOCAINE 2.5-2.5 % EX CREA
1.0000 | TOPICAL_CREAM | CUTANEOUS | Status: DC | PRN
Start: 2024-02-11 — End: 2024-02-11

## 2024-02-11 MED ORDER — NEPRO/CARBSTEADY PO LIQD: 237.0000 mL | ORAL | Status: DC | PRN

## 2024-02-11 MED ORDER — PILOCARPINE HCL 2 % OP SOLN
1.0000 [drp] | Freq: Once | OPHTHALMIC | Status: DC
  Filled 2024-02-11: qty 15

## 2024-02-11 MED ORDER — SODIUM ZIRCONIUM CYCLOSILICATE 10 G PO PACK
10.0000 g | PACK | Freq: Once | ORAL | Status: AC
  Administered 2024-02-11: 10 g via ORAL
  Filled 2024-02-11: qty 1

## 2024-02-11 MED ORDER — HEPARIN SODIUM (PORCINE) 5000 UNIT/ML IJ SOLN
5000.0000 [IU] | Freq: Three times a day (TID) | INTRAMUSCULAR | Status: DC
  Administered 2024-02-11 – 2024-02-14 (×7): 5000 [IU] via SUBCUTANEOUS
  Filled 2024-02-11 (×7): qty 1

## 2024-02-11 MED ORDER — TIMOLOL MALEATE 0.5 % OP SOLN
1.0000 [drp] | Freq: Two times a day (BID) | OPHTHALMIC | Status: DC
  Administered 2024-02-11 – 2024-02-14 (×7): 1 [drp] via OPHTHALMIC
  Filled 2024-02-11 (×2): qty 5

## 2024-02-11 MED ORDER — CALCIUM ACETATE (PHOS BINDER) 667 MG PO CAPS
2001.0000 mg | ORAL_CAPSULE | Freq: Three times a day (TID) | ORAL | Status: DC
  Administered 2024-02-11 – 2024-02-14 (×5): 2001 mg via ORAL
  Filled 2024-02-11 (×9): qty 3

## 2024-02-11 MED ORDER — ANTICOAGULANT SODIUM CITRATE 4% (200MG/5ML) IV SOLN: 5.0000 mL | Status: DC | PRN

## 2024-02-11 MED ORDER — LIDOCAINE HCL (PF) 1 % IJ SOLN: 5.0000 mL | INTRAMUSCULAR | Status: DC | PRN

## 2024-02-11 MED ORDER — DIPHENHYDRAMINE HCL 50 MG/ML IJ SOLN
12.5000 mg | Freq: Once | INTRAMUSCULAR | Status: AC
  Administered 2024-02-11: 12.5 mg via INTRAVENOUS
  Filled 2024-02-11: qty 1

## 2024-02-11 MED ORDER — LATANOPROST 0.005 % OP SOLN: 1.0000 [drp] | Freq: Once | OPHTHALMIC | Status: DC

## 2024-02-11 MED ORDER — ERYTHROMYCIN 5 MG/GM OP OINT
TOPICAL_OINTMENT | Freq: Three times a day (TID) | OPHTHALMIC | Status: DC
  Administered 2024-02-11 – 2024-02-12 (×3): 1 via OPHTHALMIC
  Filled 2024-02-11 (×2): qty 1

## 2024-02-11 MED ORDER — ALBUTEROL SULFATE (2.5 MG/3ML) 0.083% IN NEBU
2.5000 mg | INHALATION_SOLUTION | Freq: Once | RESPIRATORY_TRACT | Status: AC
  Administered 2024-02-11: 2.5 mg via RESPIRATORY_TRACT
  Filled 2024-02-11: qty 3

## 2024-02-11 MED ORDER — TETRACAINE HCL 0.5 % OP SOLN
1.0000 [drp] | Freq: Once | OPHTHALMIC | Status: AC
  Administered 2024-02-11: 1 [drp] via OPHTHALMIC
  Filled 2024-02-11: qty 4

## 2024-02-11 MED ORDER — FENTANYL CITRATE PF 50 MCG/ML IJ SOSY
50.0000 ug | PREFILLED_SYRINGE | Freq: Once | INTRAMUSCULAR | Status: AC
  Administered 2024-02-11: 50 ug via INTRAVENOUS
  Filled 2024-02-11: qty 1

## 2024-02-11 MED ORDER — ALTEPLASE 2 MG IJ SOLR: 2.0000 mg | Freq: Once | INTRAMUSCULAR | Status: DC | PRN

## 2024-02-11 MED ORDER — BRIMONIDINE TARTRATE 0.15 % OP SOLN
1.0000 [drp] | Freq: Three times a day (TID) | OPHTHALMIC | Status: DC
  Administered 2024-02-11 – 2024-02-14 (×9): 1 [drp] via OPHTHALMIC
  Filled 2024-02-11: qty 5

## 2024-02-11 MED ORDER — ATORVASTATIN CALCIUM 80 MG PO TABS
80.0000 mg | ORAL_TABLET | Freq: Every day | ORAL | Status: DC
  Administered 2024-02-12 – 2024-02-14 (×3): 80 mg via ORAL
  Filled 2024-02-11 (×3): qty 1

## 2024-02-11 MED ORDER — FAMOTIDINE 20 MG PO TABS
20.0000 mg | ORAL_TABLET | Freq: Every day | ORAL | Status: DC
  Administered 2024-02-12 – 2024-02-14 (×3): 20 mg via ORAL
  Filled 2024-02-11 (×3): qty 1

## 2024-02-11 MED ORDER — ENSURE PLUS HIGH PROTEIN PO LIQD
237.0000 mL | Freq: Two times a day (BID) | ORAL | Status: DC
  Administered 2024-02-12 (×2): 237 mL via ORAL

## 2024-02-11 MED ORDER — INSULIN ASPART 100 UNIT/ML IJ SOLN
0.0000 [IU] | Freq: Three times a day (TID) | INTRAMUSCULAR | Status: DC
  Administered 2024-02-13: 1 [IU] via SUBCUTANEOUS

## 2024-02-11 MED ORDER — NITROGLYCERIN 2 % TD OINT
1.0000 [in_us] | TOPICAL_OINTMENT | Freq: Once | TRANSDERMAL | Status: AC
  Administered 2024-02-11: 1 [in_us] via TOPICAL
  Filled 2024-02-11: qty 1

## 2024-02-11 MED ORDER — CLOPIDOGREL BISULFATE 75 MG PO TABS
75.0000 mg | ORAL_TABLET | Freq: Every day | ORAL | Status: DC
  Administered 2024-02-12 – 2024-02-14 (×3): 75 mg via ORAL
  Filled 2024-02-11 (×3): qty 1

## 2024-02-11 MED ORDER — METOCLOPRAMIDE HCL 5 MG/ML IJ SOLN
10.0000 mg | Freq: Once | INTRAMUSCULAR | Status: AC
  Administered 2024-02-11: 10 mg via INTRAVENOUS
  Filled 2024-02-11: qty 2

## 2024-02-11 MED ORDER — DORZOLAMIDE HCL 2 % OP SOLN
1.0000 [drp] | Freq: Three times a day (TID) | OPHTHALMIC | Status: DC
  Administered 2024-02-11 – 2024-02-14 (×9): 1 [drp] via OPHTHALMIC
  Filled 2024-02-11: qty 10

## 2024-02-11 MED ORDER — INSULIN ASPART 100 UNIT/ML IV SOLN
5.0000 [IU] | Freq: Once | INTRAVENOUS | Status: AC
  Administered 2024-02-11: 5 [IU] via INTRAVENOUS

## 2024-02-11 MED ORDER — DORZOLAMIDE HCL 2 % OP SOLN: 1.0000 [drp] | Freq: Once | OPHTHALMIC | Status: DC

## 2024-02-11 MED ORDER — DEXTROSE 50 % IV SOLN
1.0000 | Freq: Once | INTRAVENOUS | Status: AC
  Administered 2024-02-11: 50 mL via INTRAVENOUS
  Filled 2024-02-11: qty 50

## 2024-02-11 MED ORDER — IRBESARTAN 150 MG PO TABS
75.0000 mg | ORAL_TABLET | Freq: Every day | ORAL | Status: DC
  Administered 2024-02-12 – 2024-02-14 (×3): 75 mg via ORAL
  Filled 2024-02-11 (×3): qty 1

## 2024-02-11 MED ORDER — IRBESARTAN 300 MG PO TABS
150.0000 mg | ORAL_TABLET | Freq: Every day | ORAL | Status: DC
  Administered 2024-02-11: 150 mg via ORAL
  Filled 2024-02-11: qty 1

## 2024-02-11 MED ORDER — TIMOLOL MALEATE 0.5 % OP SOLN: 1.0000 [drp] | Freq: Once | OPHTHALMIC | Status: DC

## 2024-02-11 MED ORDER — PANTOPRAZOLE SODIUM 20 MG PO TBEC
20.0000 mg | DELAYED_RELEASE_TABLET | Freq: Every day | ORAL | Status: DC
  Administered 2024-02-12 – 2024-02-14 (×3): 20 mg via ORAL
  Filled 2024-02-11 (×3): qty 1

## 2024-02-11 MED ORDER — HYDROMORPHONE HCL 1 MG/ML IJ SOLN
0.5000 mg | INTRAMUSCULAR | Status: DC | PRN
  Administered 2024-02-11 – 2024-02-14 (×8): 0.5 mg via INTRAVENOUS
  Filled 2024-02-11 (×8): qty 0.5

## 2024-02-11 MED ORDER — BRIMONIDINE TARTRATE 0.15 % OP SOLN
1.0000 [drp] | Freq: Once | OPHTHALMIC | Status: DC
  Filled 2024-02-11: qty 5

## 2024-02-11 MED ORDER — ACETAMINOPHEN 325 MG PO TABS
650.0000 mg | ORAL_TABLET | ORAL | Status: DC | PRN
  Administered 2024-02-13 (×2): 650 mg via ORAL
  Filled 2024-02-11 (×2): qty 2

## 2024-02-11 MED ORDER — HEPARIN SODIUM (PORCINE) 1000 UNIT/ML DIALYSIS: 1000.0000 [IU] | INTRAMUSCULAR | Status: DC | PRN

## 2024-02-11 MED ORDER — ONDANSETRON HCL 4 MG/2ML IJ SOLN: 4.0000 mg | Freq: Four times a day (QID) | INTRAMUSCULAR | Status: DC | PRN

## 2024-02-11 MED ORDER — RENA-VITE PO TABS
1.0000 | ORAL_TABLET | Freq: Every day | ORAL | Status: DC
  Administered 2024-02-12 – 2024-02-14 (×3): 1 via ORAL
  Filled 2024-02-11 (×3): qty 1

## 2024-02-11 MED ORDER — ASPIRIN 81 MG PO TBEC
81.0000 mg | DELAYED_RELEASE_TABLET | Freq: Every day | ORAL | Status: DC
Start: 2024-02-12 — End: 2024-02-12
  Administered 2024-02-12: 81 mg via ORAL
  Filled 2024-02-11: qty 1

## 2024-02-11 MED ORDER — CHLORHEXIDINE GLUCONATE CLOTH 2 % EX PADS
6.0000 | MEDICATED_PAD | Freq: Every day | CUTANEOUS | Status: DC
  Administered 2024-02-12 – 2024-02-14 (×3): 6 via TOPICAL

## 2024-02-11 NOTE — ED Notes (Signed)
 Patient transported to CT

## 2024-02-11 NOTE — ED Provider Notes (Signed)
 I saw this patient on return from dialysis.  He states that he is having recurrent crushing left-sided chest pain.  Similar to the pain that he had on arrival.  He does have a history of CABG in January.  States his pain resolved for some time during dialysis but has not returned.  He is also complaining of left eye pain.  On review of chart it appears his troponin is near his baseline around 100.  He has no new ischemic EKG changes.  I did reach out to cardiology who agreed with observation.  Do not plan for emergent cardiac catheterization.  He also saw ophthalmology earlier who gave him some pressure reducing drops for his diabetic retinopathy and glaucoma.  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  They mention possibly enucleation.  Do not believe this is something that will require further emergent follow-up however given his chest pain and risk factors believe appropriate for observation.  Admitted in stable condition handoff given all questions into the best my ability.   Sharyne Darina RAMAN, MD 02/11/24 1737    Randol Simmonds, MD 02/13/24 [phone]

## 2024-02-11 NOTE — Consult Note (Signed)
 Cardiology Consultation   Patient ID: Michaelanthony Kempton MRN: [id number]; DOB: 06-18-1971  Admit date: 02/11/2024 Date of Consult: 02/11/2024  PCP:  Lorren Greig PARAS, NP   [facility] Providers Cardiologist:  Oneil Parchment, MD        Patient Profile: Alanzo Lamb is a 53 y.o. male with a hx of coronary artery bypass grafting who is being seen 02/11/2024 for the evaluation of atypical chest pain at the request of Dr. Randol.  History of Present Illness: Mr. Chesnut is a 53 year old moderately overweight single African-American male father one 45 year old son who is a cardiology patient of Dr. Parchment.  He has a history of hypertension, hyperlipidemia and end-stage renal disease on hemodialysis Monday/Wednesday/Friday for the last 5 years.  He had a non-STEMI 06/28/2023 and underwent cardiac catheterization revealing three-vessel disease and ultimately had CABG by Dr. Shyrl.  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  He apparently missed dialysis last Friday and had dialysis today.  He developed headache and atypical chest pain which he characterizes as pleuritic.  His lab work is remarkable for a serum creatinine of 17.  He is mildly anemic.  His troponins are mildly elevated in the low 100 range but flat.  He does have a history of moderate LV dysfunction with an EF of 40% by 2D echo after bypass surgery.   Past Medical History:  Diagnosis Date   Acute blood loss anemia 04/17/2022   Acute encephalopathy 08/11/2023   Acute hypoxic respiratory failure (HCC) 04/02/2023   Adult general medical exam 07/11/2022   Allergy, unspecified, initial encounter 08/25/2019   Asthma    as a child   Cellulitis, perineum 11/26/2019   CHF (congestive heart failure) (HCC) 2021   Complication of vascular dialysis catheter 08/25/2019   COVID-19 virus infection 07/27/2019   Diabetes mellitus without complication (HCC)    type 2   Dilated  cardiomyopathy (HCC) 07/03/2018   Elevated troponin    ESRD on hemodialysis (HCC) 06/17/2018   MWF at [facility]   Fe deficiency anemia 12/23/2018   Hypertension    Legally blind    B/L   NSTEMI (non-ST elevated myocardial infarction) (HCC) 06/25/2023   Pneumonia 2021   Sepsis (HCC) 04/17/2022   Type 2 diabetes mellitus with diabetic peripheral angiopathy without gangrene (HCC) 08/25/2019   Unspecified protein-calorie malnutrition (HCC) 08/25/2019    Past Surgical History:  Procedure Laterality Date   BASCILIC VEIN TRANSPOSITION Right 10/16/2019   Procedure: Basilic Vein Transposition Right Arm;  Surgeon: Serene Gaile ORN, MD;  Location: [facility] OR;  Service: Vascular;  Laterality: Right;   BASCILIC VEIN TRANSPOSITION Right 01/29/2020   Procedure: RIGHT ARM SECOND STAGE BASCILIC VEIN TRANSPOSITION;  Surgeon: Serene Gaile ORN, MD;  Location: [facility] OR;  Service: Vascular;  Laterality: Right;   BIOPSY  12/22/2018   Procedure: BIOPSY;  Surgeon: Celestia Agent, MD;  Location: [facility] ENDOSCOPY;  Service: Endoscopy;;   CORONARY ARTERY BYPASS GRAFT N/A 06/28/2023   Procedure: CORONARY ARTERY BYPASS GRAFTING TIMES THREE USING LEFT INTERNAL MAMMARY ARTERY AND RIGHT GREAT SAPHENOUS VEIN HARVESTED ENDOSCOPICALLY;  Surgeon: Shyrl Linnie KIDD, MD;  Location: [facility] OR;  Service: Open Heart Surgery;  Laterality: N/A;   CORONARY ULTRASOUND/IVUS N/A 12/13/2022   Procedure: Coronary Ultrasound/IVUS;  Surgeon: Dann Candyce RAMAN, MD;  Location: [facility] INVASIVE CV LAB;  Service: Cardiovascular;  Laterality: N/A;   ESOPHAGOGASTRODUODENOSCOPY N/A 12/22/2018   Procedure: ESOPHAGOGASTRODUODENOSCOPY (EGD);  Surgeon: Celestia Agent, MD;  Location: [facility]  ENDOSCOPY;  Service: Endoscopy;  Laterality: N/A;   IR FLUORO GUIDE CV LINE RIGHT  07/29/2019   IR US  GUIDE VASC ACCESS RIGHT  07/29/2019   LEFT HEART CATH AND CORONARY ANGIOGRAPHY N/A 12/13/2022   Procedure: LEFT HEART CATH AND CORONARY ANGIOGRAPHY;  Surgeon: Dann Candyce RAMAN, MD;   Location: [facility] INVASIVE CV LAB;  Service: Cardiovascular;  Laterality: N/A;   LEFT HEART CATH AND CORONARY ANGIOGRAPHY N/A 06/25/2023   Procedure: LEFT HEART CATH AND CORONARY ANGIOGRAPHY;  Surgeon: Verlin Lonni BIRCH, MD;  Location: [facility] INVASIVE CV LAB;  Service: Cardiovascular;  Laterality: N/A;   TEE WITHOUT CARDIOVERSION N/A 06/28/2023   Procedure: TRANSESOPHAGEAL ECHOCARDIOGRAM (TEE);  Surgeon: Shyrl Linnie KIDD, MD;  Location: [facility] OR;  Service: Open Heart Surgery;  Laterality: N/A;   TRANSESOPHAGEAL ECHOCARDIOGRAM (CATH LAB) N/A 05/10/2023   Procedure: TRANSESOPHAGEAL ECHOCARDIOGRAM;  Surgeon: Barbaraann Darryle Ned, MD;  Location: [facility] INVASIVE CV LAB;  Service: Cardiovascular;  Laterality: N/A;       Scheduled Meds:  [START ON 02/12/2024] aspirin  EC  81 mg Oral Daily   [START ON 02/12/2024] atorvastatin   80 mg Oral Daily   brimonidine   1 drop Left Eye TID   calcium  acetate  2,001 mg Oral TID with meals   carvedilol   25 mg Oral BID WC   Chlorhexidine  Gluconate Cloth  6 each Topical Q0600   [START ON 02/12/2024] clopidogrel   75 mg Oral Daily   dorzolamide   1 drop Left Eye TID   erythromycin    Left Eye TID   [START ON 02/12/2024] famotidine   20 mg Oral Daily   heparin   5,000 Units Subcutaneous Q8H    HYDROmorphone  (DILAUDID ) injection  0.5 mg Intravenous Once   [START ON 02/12/2024] irbesartan   75 mg Oral Daily   latanoprost   1 drop Left Eye Daily   [START ON 02/12/2024] multivitamin  1 tablet Oral Daily   [START ON 02/12/2024] pantoprazole   20 mg Oral Daily   timolol   1 drop Left Eye BID   Continuous Infusions:  PRN Meds: acetaminophen , ondansetron  (ZOFRAN ) IV  Allergies:    Allergies  Allergen Reactions   Gabapentin      Hallucinations    Oxycodone      Hallucinations    Imdur  [Isosorbide  Nitrate] Other (See Comments)    Endorsed headache in the past - willing to retry    Social History:   Social History   Socioeconomic History   Marital status: Single    Spouse  name: Not on file   Number of children: Not on file   Years of education: Not on file   Highest education level: Not on file  Occupational History   Not on file  Tobacco Use   Smoking status: Never   Smokeless tobacco: Never  Vaping Use   Vaping status: Never Used  Substance and Sexual Activity   Alcohol use: Not Currently   Drug use: Not Currently    Types: Marijuana   Sexual activity: Not on file  Other Topics Concern   Not on file  Social History Narrative   Not on file   Social Drivers of Health   Financial Resource Strain: Low Risk  (11/01/2023)   Overall Financial Resource Strain (CARDIA)    Difficulty of Paying Living Expenses: Not very hard  Recent Concern: Financial Resource Strain - High Risk (08/23/2023)   Overall Financial Resource Strain (CARDIA)    Difficulty of Paying Living Expenses: Hard  Food Insecurity: Food Insecurity Present (11/01/2023)   Hunger Vital Sign  Worried About Programme researcher, broadcasting/film/video in the Last Year: Sometimes true    Ran Out of Food in the Last Year: Sometimes true  Transportation Needs: Unmet Transportation Needs (11/01/2023)   PRAPARE - Administrator, Civil Service (Medical): Yes    Lack of Transportation (Non-Medical): Yes  Physical Activity: Insufficiently Active (11/01/2023)   Exercise Vital Sign    Days of Exercise per Week: 7 days    Minutes of Exercise per Session: 20 min  Stress: No Stress Concern Present (11/01/2023)   [employer] of Occupational Health - Occupational Stress Questionnaire    Feeling of Stress : Not at all  Social Connections: Moderately Integrated (11/01/2023)   Social Connection and Isolation Panel    Frequency of Communication with Friends and Family: More than three times a week    Frequency of Social Gatherings with Friends and Family: More than three times a week    Attends Religious Services: More than 4 times per year    Active Member of [employer] or Organizations: Yes    Attends Hospital doctor: More than 4 times per year    Marital Status: Never married  Intimate Partner Violence: Not At Risk (11/01/2023)   Humiliation, Afraid, Rape, and Kick questionnaire    Fear of Current or Ex-Partner: No    Emotionally Abused: No    Physically Abused: No    Sexually Abused: No    Family History:    Family History  Problem Relation Age of Onset   CAD Mother    Hypertension Mother    Diabetes Neg Hx    Stroke Neg Hx    Cancer Neg Hx    Kidney failure Neg Hx    Stomach cancer Neg Hx    Colon cancer Neg Hx    Rectal cancer Neg Hx      ROS:  Please see the history of present illness.   All other ROS reviewed and negative.     Physical Exam/Data: Vitals:   02/11/24 1526 02/11/24 1530 02/11/24 1620 02/11/24 1626  BP: (!) 176/90 (!) 176/90 (!) 186/99   Pulse: 87  86   Resp: 16  15   Temp: 98 F (36.7 C) 98 F (36.7 C)  98.1 F (36.7 C)  TempSrc:  Oral  Oral  SpO2: 99%  100%   Weight: 100.3 kg     Height:        Intake/Output Summary (Last 24 hours) at 02/11/2024 1710 Last data filed at 02/11/2024 1526 Gross per 24 hour  Intake --  Output 2600 ml  Net -2600 ml      02/11/2024    3:26 PM 02/11/2024    6:13 AM 11/20/2023    4:19 PM  Last 3 Weights  Weight (lbs) 221 lb 1.9 oz 225 lb 215 lb  Weight (kg) 100.3 kg 102.059 kg 97.523 kg     Body mass index is 29.17 kg/m.  General:  Well nourished, well developed, in no acute distress HEENT: normal Neck: no JVD Vascular: No carotid bruits; Distal pulses 2+ bilaterally Cardiac:  normal S1, S2; RRR; no murmur  Lungs:  clear to auscultation bilaterally, no wheezing, rhonchi or rales  Abd: soft, nontender, no hepatomegaly  Ext: no edema Musculoskeletal:  No deformities, BUE and BLE strength normal and equal Skin: warm and dry  Neuro:  CNs 2-12 intact, no focal abnormalities noted Psych:  Normal affect   EKG:  The EKG was personally reviewed and demonstrates: Sinus  tachycardia at 80 with LVH and nonspecific ST  and T wave changes Telemetry:  Telemetry was personally reviewed and demonstrates: Sinus rhythm  Relevant CV Studies: None  Laboratory Data: High Sensitivity Troponin:   Recent Labs  Lab 02/11/24 0619 02/11/24 0817  TROPONINIHS 115* 100*     Chemistry Recent Labs  Lab 02/11/24 0619  NA 139  K 5.9*  CL 100  CO2 20*  GLUCOSE 126*  BUN 75*  CREATININE 17.00*  CALCIUM  9.1  GFRNONAA 3*  ANIONGAP 19*    Recent Labs  Lab 02/11/24 0620  PROT 8.1  ALBUMIN  3.5  AST 20  ALT 16  ALKPHOS 72  BILITOT 1.0   Lipids No results for input(s): CHOL, TRIG, HDL, LABVLDL, LDLCALC, CHOLHDL in the last 168 hours.  Hematology Recent Labs  Lab 02/11/24 0619  WBC 7.7  RBC 3.20*  HGB 10.7*  HCT 31.5*  MCV 98.4  MCH 33.4  MCHC 34.0  RDW 15.2  PLT 170   Thyroid No results for input(s): TSH, FREET4 in the last 168 hours.  BNP Recent Labs  Lab 02/11/24 0621  BNP 1,037.0*    DDimer No results for input(s): DDIMER in the last 168 hours.  Radiology/Studies:  CT Head Wo Contrast Result Date: 02/11/2024 CLINICAL DATA:  53 year old male with chest pain, shortness of breath, acute headache. Hypertensive, 182/90. EXAM: CT HEAD WITHOUT CONTRAST TECHNIQUE: Contiguous axial images were obtained from the base of the skull through the vertex without intravenous contrast. RADIATION DOSE REDUCTION: This exam was performed according to the departmental dose-optimization program which includes automated exposure control, adjustment of the mA and/or kV according to patient size and/or use of iterative reconstruction technique. COMPARISON:  Brain MRI 08/12/2023.  Head CT 09/01/2023. FINDINGS: Brain: Cerebral volume remains normal. No midline shift, ventriculomegaly, mass effect, evidence of mass lesion, intracranial hemorrhage or evidence of cortically based acute infarction. Stable minimal to mild periventricular white matter hypodensity by CT. Maintained gray-white differentiation.  Vascular: Severe Calcified atherosclerosis at the skull base. No suspicious intracranial vascular hyperdensity. Skull: Intact skull. Partially visible maxillary alveolus dental disease lucency. No acute osseous abnormality identified. Sinuses/Orbits: Stable mild paranasal sinus mucosal thickening. Tympanic cavities and mastoids remain well aerated. Other: Calcified scalp vessel atherosclerosis. No acute orbit or scalp soft tissue finding. IMPRESSION: 1. No acute intracranial abnormality. Stable mild for age cerebral white matter changes. 2. Severe calcified atherosclerosis, including scalp vessels, such as due to end-stage renal disease. 3. Partially visible maxillary dental disease. Electronically Signed   By: VEAR Hurst M.D.   On: 02/11/2024 07:17   DG Chest 2 View Result Date: 02/11/2024 CLINICAL DATA:  Chest pain shortness of breath. EXAM: CHEST - 2 VIEW COMPARISON:  11/20/2023 FINDINGS: The cardio pericardial silhouette is enlarged. Vascular congestion with subtle interstitial opacity suggesting edema. No focal consolidation. Small left pleural effusion suspected. Telemetry leads overlie the chest. IMPRESSION: Enlargement of the cardiopericardial silhouette with vascular congestion and subtle interstitial opacity suggesting edema. Electronically Signed   By: Camellia Candle M.D.   On: 02/11/2024 06:44     Assessment and Plan: Atypical chest pain-chest pain is pleuritic.  His enzymes are mildly elevated but flat at approximately 100 consistent with his end-stage renal disease.  I do not think he has acute coronary syndrome.  Agree with 2D echo to rule out pericardial effusion. Essential hypertension-blood pressures clearly elevated at 186/99.  I am not sure what medicines he is on as an outpatient.  Suspect he will need antihypertensive medications  begun as an inpatient. Hyperlipidemia-was on statin therapy at discharge End-stage renal disease-has been on dialysis Monday/Wednesday/Friday for the last 5  years.  He missed dialysis on Friday for unclear reasons.  His serum creatinine is 17.  Renal has seen him.  He is also mildly hyperkalemic, and anemic. LV dysfunction-this was probably ischemically mediated pre-bypass.  He has not had a 2D echo since.  This should be repeated. Elevated BNP-he does have vascular congestion on his chest x-ray.  I suspect he is little bit wet and probably under dialyzed.  Will check a 2D echocardiogram.   Risk Assessment/Risk Scores:              For questions or updates, please contact [facility] Please consult [email or web address] for contact info under    Signed, Dorn Lesches, MD  02/11/2024 5:10 PM

## 2024-02-11 NOTE — Consult Note (Signed)
 Reason for consult: eye pain OS  HPI: Corey Park is an 53 y.o. male.  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  He started having pain OS a month ago.  Head hurts too.  + FBS.   Pain is bad  He is currently admitted ro dialysis.  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/17/2018   MWF at [facility]   Fe deficiency anemia 12/23/2018   Hypertension    Legally blind    B/L   Pneumonia 2021   Type 2 diabetes mellitus with diabetic peripheral angiopathy without gangrene (HCC) 08/25/2019   Unspecified protein-calorie malnutrition (HCC) 08/25/2019   Past Surgical History:  Procedure Laterality Date   BASCILIC VEIN TRANSPOSITION Right 10/16/2019   Procedure: Basilic Vein Transposition Right Arm;  Surgeon: Serene Gaile ORN, MD;  Location: [facility] OR;  Service: Vascular;  Laterality: Right;   BASCILIC VEIN TRANSPOSITION Right 01/29/2020   Procedure: RIGHT ARM SECOND STAGE BASCILIC VEIN TRANSPOSITION;  Surgeon: Serene Gaile ORN, MD;  Location: [facility] OR;  Service: Vascular;  Laterality: Right;   BIOPSY  12/22/2018   Procedure: BIOPSY;  Surgeon: Celestia Agent, MD;  Location: [facility] ENDOSCOPY;  Service: Endoscopy;;   CORONARY ARTERY BYPASS GRAFT N/A 06/28/2023   Procedure: CORONARY ARTERY BYPASS GRAFTING TIMES THREE USING LEFT INTERNAL MAMMARY ARTERY AND RIGHT GREAT SAPHENOUS VEIN HARVESTED ENDOSCOPICALLY;  Surgeon: Shyrl Linnie KIDD, MD;  Location: [facility] OR;  Service: Open Heart Surgery;   Laterality: N/A;   CORONARY ULTRASOUND/IVUS N/A 12/13/2022   Procedure: Coronary Ultrasound/IVUS;  Surgeon: Dann Candyce RAMAN, MD;  Location: [facility] INVASIVE CV LAB;  Service: Cardiovascular;  Laterality: N/A;   ESOPHAGOGASTRODUODENOSCOPY N/A 12/22/2018   Procedure: ESOPHAGOGASTRODUODENOSCOPY (EGD);  Surgeon: Celestia Agent, MD;  Location: [facility] ENDOSCOPY;  Service: Endoscopy;  Laterality: N/A;   IR FLUORO GUIDE CV LINE RIGHT  07/29/2019   IR US  GUIDE VASC ACCESS RIGHT  07/29/2019   LEFT HEART CATH AND CORONARY ANGIOGRAPHY N/A 12/13/2022   Procedure: LEFT HEART CATH AND CORONARY ANGIOGRAPHY;  Surgeon: Dann Candyce RAMAN, MD;  Location: [facility] INVASIVE CV LAB;  Service: Cardiovascular;  Laterality: N/A;   LEFT HEART CATH AND CORONARY ANGIOGRAPHY N/A 06/25/2023   Procedure: LEFT HEART CATH AND CORONARY ANGIOGRAPHY;  Surgeon: Verlin Lonni BIRCH, MD;  Location: [facility] INVASIVE CV LAB;  Service: Cardiovascular;  Laterality: N/A;   TEE WITHOUT CARDIOVERSION N/A 06/28/2023   Procedure: TRANSESOPHAGEAL ECHOCARDIOGRAM (TEE);  Surgeon: Shyrl Linnie KIDD, MD;  Location: [facility] OR;  Service: Open Heart Surgery;  Laterality: N/A;   TRANSESOPHAGEAL ECHOCARDIOGRAM (CATH LAB) N/A 05/10/2023   Procedure: TRANSESOPHAGEAL ECHOCARDIOGRAM;  Surgeon: Barbaraann Darryle Ned, MD;  Location: [facility] INVASIVE CV LAB;  Service: Cardiovascular;  Laterality: N/A;   Family History  Problem Relation Age of Onset   CAD Mother    Hypertension Mother    Diabetes Neg Hx    Stroke Neg Hx    Cancer Neg Hx    Kidney failure Neg Hx    Stomach cancer Neg  Hx    Colon cancer Neg Hx    Rectal cancer Neg Hx    Current Facility-Administered Medications  Medication Dose Route Frequency Provider Last Rate Last Admin   alteplase  (CATHFLO ACTIVASE ) injection 2 mg  2 mg Intracatheter Once PRN Geralynn Charleston, MD       anticoagulant sodium citrate  solution 5 mL  5 mL Intracatheter PRN Geralynn Charleston, MD       brimonidine  (ALPHAGAN ) 0.15 %  ophthalmic solution 1 drop  1 drop Left Eye TID Melvenia Motto, MD   1 drop at 02/11/24 9164   carvedilol  (COREG ) tablet 25 mg  25 mg Oral BID WC Melvenia Motto, MD   25 mg at 02/11/24 9359   Chlorhexidine  Gluconate Cloth 2 % PADS 6 each  6 each Topical Q0600 Geralynn Charleston, MD       dorzolamide  (TRUSOPT ) 2 % ophthalmic solution 1 drop  1 drop Left Eye TID Melvenia Motto, MD   1 drop at 02/11/24 0834   feeding supplement (NEPRO CARB STEADY) liquid 237 mL  237 mL Oral PRN Geralynn Charleston, MD       heparin  injection 1,000 Units  1,000 Units Intracatheter PRN Geralynn Charleston, MD       irbesartan  (AVAPRO ) tablet 150 mg  150 mg Oral Daily Melvenia Motto, MD   150 mg at 02/11/24 9359   latanoprost  (XALATAN ) 0.005 % ophthalmic solution 1 drop  1 drop Left Eye Daily Melvenia Motto, MD   1 drop at 02/11/24 9164   lidocaine  (PF) (XYLOCAINE ) 1 % injection 5 mL  5 mL Intradermal PRN Geralynn Charleston, MD       lidocaine -prilocaine  (EMLA ) cream 1 Application  1 Application Topical PRN Geralynn Charleston, MD       pentafluoroprop-tetrafluoroeth (GEBAUERS) aerosol 1 Application  1 Application Topical PRN Geralynn Charleston, MD       timolol  (TIMOPTIC ) 0.5 % ophthalmic solution 1 drop  1 drop Left Eye BID Melvenia Motto, MD   1 drop at 02/11/24 9165   Allergies  Allergen Reactions   Gabapentin      Hallucinations    Oxycodone      Hallucinations    Imdur  [Isosorbide  Nitrate] Other (See Comments)    Endorsed headache in the past - willing to retry   Social History   Socioeconomic History   Marital status: Single    Spouse name: Not on file   Number of children: Not on file   Years of education: Not on file   Highest education level: Not on file  Occupational History   Not on file  Tobacco Use   Smoking status: Never   Smokeless tobacco: Never  Vaping Use   Vaping status: Never Used  Substance and Sexual Activity   Alcohol use: Not Currently   Drug use: Not Currently    Types: Marijuana   Sexual activity: Not on file   Other Topics Concern   Not on file  Social History Narrative   Not on file   Social Drivers of Health   Financial Resource Strain: Low Risk  (11/01/2023)   Overall Financial Resource Strain (CARDIA)    Difficulty of Paying Living Expenses: Not very hard  Recent Concern: Financial Resource Strain - High Risk (08/23/2023)   Overall Financial Resource Strain (CARDIA)    Difficulty of Paying Living Expenses: Hard  Food Insecurity: Food Insecurity Present (11/01/2023)   Hunger Vital Sign    Worried About Running Out of Food in the Last Year: Sometimes true  Ran Out of Food in the Last Year: Sometimes true  Transportation Needs: Unmet Transportation Needs (11/01/2023)   PRAPARE - Transportation    Lack of Transportation (Medical): Yes    Lack of Transportation (Non-Medical): Yes  Physical Activity: Insufficiently Active (11/01/2023)   Exercise Vital Sign    Days of Exercise per Week: 7 days    Minutes of Exercise per Session: 20 min  Stress: No Stress Concern Present (11/01/2023)   [employer] of Occupational Health - Occupational Stress Questionnaire    Feeling of Stress : Not at all  Social Connections: Moderately Integrated (11/01/2023)   Social Connection and Isolation Panel    Frequency of Communication with Friends and Family: More than three times a week    Frequency of Social Gatherings with Friends and Family: More than three times a week    Attends Religious Services: More than 4 times per year    Active Member of [employer] or Organizations: Yes    Attends Banker Meetings: More than 4 times per year    Marital Status: Never married  Intimate Partner Violence: Not At Risk (11/01/2023)   Humiliation, Afraid, Rape, and Kick questionnaire    Fear of Current or Ex-Partner: No    Emotionally Abused: No    Physically Abused: No    Sexually Abused: No    Review of systems: Review of Systems  Constitutional:  Positive for malaise/fatigue.  HENT: Negative.    Eyes:   Positive for pain.  Respiratory: Negative.    Cardiovascular: Negative.   Gastrointestinal: Negative.   Musculoskeletal: Negative.   Neurological:  Positive for headaches.  Endo/Heme/Allergies: Negative.     Physical Exam: Pt lying down in dialysis unit Blood pressure (!) 161/85, pulse 85, temperature 98 F (36.7 C), resp. rate 19, height 6' 1 (1.854 m), weight 102.1 kg, SpO2 93%.   [facility] [place]: OD: bare Light perception OS:  No light perception  Pupils:   OD round, unreactive to light           OS:  Poor view through cloudy cornea  IOP (T pen)  OD 25  OS 55   CVF: N/A  Motility:  OD full ductions  OS full ductions  Balance/alignment:  N/A   Bedside  examination:                                 OD                                       External/adnexa: Normal                                      Lids/lashes:        Normal                                      Conjunctiva        White, quiet        Cornea:              Clear                  AC:  Deep,                               Iris:                     Normal        Lens:                  NS                                       OS                                       External/adnexa: Normal                                      Lids/lashes:        Normal                                      Conjunctiva        Mild injection       Cornea:              2+ cloudiness and small inferior epithelial defect                  AC:                     Deep,                                Iris:                     Poor view        Lens:                  No view      Labs/studies: Results for orders placed or performed during the hospital encounter of 02/11/24 (from the past 48 hours)  Basic metabolic panel     Status: Abnormal   Collection Time: 02/11/24  6:19 AM  Result Value Ref Range   Sodium 139 135 - 145 mmol/L   Potassium 5.9 (H) 3.5 - 5.1 mmol/L   Chloride 100 98 - 111 mmol/L   CO2 20 (L) 22 - 32  mmol/L   Glucose, Bld 126 (H) 70 - 99 mg/dL    Comment: Glucose reference range applies only to samples taken after fasting for at least 8 hours.   BUN 75 (H) 6 - 20 mg/dL   Creatinine, Ser 82.99 (H) 0.61 - 1.24 mg/dL   Calcium  9.1 8.9 - 10.3 mg/dL   GFR, Estimated 3 (L) >60 mL/min    Comment: (NOTE) Calculated using the CKD-EPI Creatinine Equation (2021)    Anion gap 19 (H) 5 - 15    Comment: Performed at [facility] Lab, [address], [place], [place] [address]  CBC     Status: Abnormal   Collection Time: 02/11/24  6:19 AM  Result  Value Ref Range   WBC 7.7 4.0 - 10.5 K/uL   RBC 3.20 (L) 4.22 - 5.81 MIL/uL   Hemoglobin 10.7 (L) 13.0 - 17.0 g/dL   HCT 68.4 (L) 60.9 - 47.9 %   MCV 98.4 80.0 - 100.0 fL   MCH 33.4 26.0 - 34.0 pg   MCHC 34.0 30.0 - 36.0 g/dL   RDW 84.7 88.4 - 84.4 %   Platelets 170 150 - 400 K/uL   nRBC 0.0 0.0 - 0.2 %    Comment: Performed at [facility] Lab, [address], [place], [place] [address]  Troponin I (High Sensitivity)     Status: Abnormal   Collection Time: 02/11/24  6:19 AM  Result Value Ref Range   Troponin I (High Sensitivity) 115 (HH) <18 ng/L    Comment: CRITICAL RESULT CALLED TO, READ BACK BY AND VERIFIED WITH HANELY,B RN [phone] 02/11/24 AMIREHSANIF (NOTE) Elevated high sensitivity troponin I (hsTnI) values and significant  changes across serial measurements may suggest ACS but many other  chronic and acute conditions are known to elevate hsTnI results.  Refer to the Links section for chest pain algorithms and additional  guidance. Performed at [facility] Lab, [address], [place], [place] [address]   Hepatic function panel     Status: None   Collection Time: 02/11/24  6:20 AM  Result Value Ref Range   Total Protein 8.1 6.5 - 8.1 g/dL   Albumin  3.5 3.5 - 5.0 g/dL   AST 20 15 - 41 U/L   ALT 16 0 - 44 U/L   Alkaline Phosphatase 72 38 - 126 U/L   Total Bilirubin 1.0 0.0 - 1.2 mg/dL   Bilirubin, Direct 0.2 0.0 - 0.2 mg/dL    Indirect Bilirubin 0.8 0.3 - 0.9 mg/dL    Comment: Performed at [facility], [address] [place], [place] [address]  Lipase, blood     Status: Abnormal   Collection Time: 02/11/24  6:20 AM  Result Value Ref Range   Lipase 81 (H) 11 - 51 U/L    Comment: Performed at [facility], [address], [place], [place] [address]  Brain natriuretic peptide     Status: Abnormal   Collection Time: 02/11/24  6:21 AM  Result Value Ref Range   B Natriuretic Peptide 1,037.0 (H) 0.0 - 100.0 pg/mL    Comment: Performed at [facility] Lab, [address] [place], [place] [address]  Troponin I (High Sensitivity)     Status: Abnormal   Collection Time: 02/11/24  8:17 AM  Result Value Ref Range   Troponin I (High Sensitivity) 100 (HH) <18 ng/L    Comment: CRITICAL VALUE NOTED. VALUE IS CONSISTENT WITH PREVIOUSLY REPORTED/CALLED VALUE (NOTE) Elevated high sensitivity troponin I (hsTnI) values and significant  changes across serial measurements may suggest ACS but many other  chronic and acute conditions are known to elevate hsTnI results.  Refer to the Links section for chest pain algorithms and additional  guidance. Performed at [facility], [address] [place], [place] [address]   CBG monitoring, ED     Status: Abnormal   Collection Time: 02/11/24  9:19 AM  Result Value Ref Range   Glucose-Capillary 103 (H) 70 - 99 mg/dL    Comment: Glucose reference range applies only to samples taken after fasting for at least 8 hours.   CT Head Wo Contrast Result Date: 02/11/2024 CLINICAL DATA:  52 year old male with chest  pain, shortness of breath, acute headache. Hypertensive, 182/90. EXAM: CT HEAD WITHOUT CONTRAST TECHNIQUE: Contiguous axial images were obtained from the base of the skull through the vertex without intravenous contrast. RADIATION DOSE REDUCTION: This exam was performed according to the departmental dose-optimization program which includes automated exposure  control, adjustment of the mA and/or kV according to patient size and/or use of iterative reconstruction technique. COMPARISON:  Brain MRI 08/12/2023.  Head CT 09/01/2023. FINDINGS: Brain: Cerebral volume remains normal. No midline shift, ventriculomegaly, mass effect, evidence of mass lesion, intracranial hemorrhage or evidence of cortically based acute infarction. Stable minimal to mild periventricular white matter hypodensity by CT. Maintained gray-white differentiation. Vascular: Severe Calcified atherosclerosis at the skull base. No suspicious intracranial vascular hyperdensity. Skull: Intact skull. Partially visible maxillary alveolus dental disease lucency. No acute osseous abnormality identified. Sinuses/Orbits: Stable mild paranasal sinus mucosal thickening. Tympanic cavities and mastoids remain well aerated. Other: Calcified scalp vessel atherosclerosis. No acute orbit or scalp soft tissue finding. IMPRESSION: 1. No acute intracranial abnormality. Stable mild for age cerebral white matter changes. 2. Severe calcified atherosclerosis, including scalp vessels, such as due to end-stage renal disease. 3. Partially visible maxillary dental disease. Electronically Signed   By: VEAR Hurst M.D.   On: 02/11/2024 07:17   DG Chest 2 View Result Date: 02/11/2024 CLINICAL DATA:  Chest pain shortness of breath. EXAM: CHEST - 2 VIEW COMPARISON:  11/20/2023 FINDINGS: The cardio pericardial silhouette is enlarged. Vascular congestion with subtle interstitial opacity suggesting edema. No focal consolidation. Small left pleural effusion suspected. Telemetry leads overlie the chest. IMPRESSION: Enlargement of the cardiopericardial silhouette with vascular congestion and subtle interstitial opacity suggesting edema. Electronically Signed   By: Camellia Candle M.D.   On: 02/11/2024 06:44                             Assessment and Plan: Blind painful eye OS.  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  (Elevated IOP causing corneal epithelium to break down.)  Recommend:   1.Continue IOP lowering drops OS:  timolol  bid, alphagan  tid, dorzolamide  tid, latanoprost  every day 2.  Add erythromycin  ophthalmic ung OS tid 3.  If pain cannot be controlled medically, consider evisceration with oculoplastics.  All of the above information was relayed to the patient and/or patient family.    Michiel Sivley L 02/11/2024, 1:49 PM  [facility] Ophthalmology [phone]

## 2024-02-11 NOTE — ED Provider Notes (Signed)
 Received patient in turnover from Dr. Melvenia.  Please see their note for further details of Hx, PE.  Briefly patient is a 53 y.o. male with a Chest Pain and Shortness of Breath .  Patient with multiple days since last dialysis here with chest pain and trouble breathing.  Not requiring oxygen.  Mild edema.  BNP is slightly elevated.  Troponin looks like it is at his baseline.  Potassium of 5.9.  Patient also complaining of eye pain.  Has significantly elevated intraocular pressure.  Discussed with ophthalmology and gave formal recommendations will come and see patient at bedside.  Awaiting patient return from dialysis.      Emil Share, DO 02/11/24 1547

## 2024-02-11 NOTE — Progress Notes (Addendum)
 Received patient in ED stretcher bed,alert and oriented to place and person.He is blind. He gave verbal consent for his hd treatment as witnessed by other HD nurse.  Access used: Right arm avf that worked well.  \Duration of treatment: 2.40  Uf goal:2.6 liters.  Hemo comment: Quit on his last 20 minutes of his treatment.  Hand off to the patient's E.D. nurse for reassessment.

## 2024-02-11 NOTE — Progress Notes (Signed)
 Pt receives op HD at [facility],MWF,15:55 chair time. Will continue to assist as needed.   Lavanda Kaylynn Chamblin Dialysis Navigator [phone]

## 2024-02-11 NOTE — ED Triage Notes (Signed)
 Patient BIB EMS for evaluation of chest pain and shortness of breath x1 hour. Patient reports he just got off work when pain started.   BP182/90 HR 84 SpO2 98%  324mg  aspirin  given with EMS

## 2024-02-11 NOTE — Procedures (Signed)
 Asked to see this patient for hospital dialysis. Pt presented to ED c/o pain in his left eye. Does not require admission, but may need HD per ED MD due to [facility] and question of vol excess. Last HD was last week on Wed (missed HD Friday). CXR today shows vasc congestion, K+ 5.9. On exam, not in distress, mild crackles L base, RRR, abd soft, no LE edema. R AVF+bruit.   OP HD: MWF [facility] From jan 2025 --> 4h  102.3kg 2K bath AVF  Heparin  none   The plan will be for ED HD. Pt is not to be admitted at this time. Pt will go to the dialysis unit when they are ready for the patient. When dialysis is completed pt will be sent back to ED for reassessment.   I was present at the procedure, reviewed the HD regimen and made appropriate changes.   Myer Fret MD  [facility] 02/11/2024, 2:12 PM

## 2024-02-11 NOTE — H&P (Signed)
 History and Physical   Corey Park FMW:[id number] DOB: Nov 21, 1970 DOA: 02/11/2024  PCP: Lorren Greig PARAS, NP   Patient coming from: Home  Chief Complaint: Chest pain, shortness of breath  HPI: Corey Park is a 53 y.o. male with medical history significant of hypertension, hyperlipidemia, diabetes, GERD, CAD status post CABG earlier this year, anemia, chronic combined systolic and diastolic CHF, bilateral blindness, ESRD on HD, obesity presenting with chest pain and shortness of breath.  Patient reports that while getting ready for work today he started having left-sided chest pain.  EMS was called and patient noted to be hypertensive in the 180s systolic.  Patient received aspirin  en route to the ED.  Patient has reported some headache and left eye pain as well.  Last dialysis session was Wednesday.  Nephrology consulted and patient went for dialysis which was successful.  Had 2 L removed but had continued chest pain after.  Denies fevers, chills, abdominal pain, constipation, diarrhea, nausea, vomiting.  ED Course: Vital signs in the ED notable for blood pressure in the 180s-220s systolic.  Lab workup included CMP with potassium initially 5.9, bicarb 20, BUN 75, creatinine 18, anion gap 19, glucose 126.  CBC with hemoglobin stable at 10.7.  Troponin trend 115, 100, repeat pending.  BNP 1037.  Lipase mildly elevated at 89.  Hepatitis B labs pending.  Chest x-ray showed cardiomegaly with changes consistent with pulmonary edema.  CT head showed no acute abnormality, showed severe atherosclerosis.  Patient received fentanyl , Dilaudid , Coreg , irbesartan , insulin , glucose, Lokelma , albuterol , Reglan  in the ED.  Started on eyedrops.  No Nephrology consulted and patient received dialysis session as above.    Cardiology consulted recommending admission to observe given his persistent chest pain and recent CABG.  Ophthalmology consulted and recommending eyedrops that have been ordered for  now.  Review of Systems: As per HPI otherwise all other systems reviewed and are negative.  Past Medical History:  Diagnosis Date   Acute blood loss anemia 04/17/2022   Acute encephalopathy 08/11/2023   Acute hypoxic respiratory failure (HCC) 04/02/2023   Adult general medical exam 07/11/2022   Allergy, unspecified, initial encounter 08/25/2019   Asthma    as a child   Cellulitis, perineum 11/26/2019   CHF (congestive heart failure) (HCC) 2021   Complication of vascular dialysis catheter 08/25/2019   COVID-19 virus infection 07/27/2019   Diabetes mellitus without complication (HCC)    type 2   Dilated cardiomyopathy (HCC) 07/03/2018   Elevated troponin    ESRD on hemodialysis (HCC) 06/17/2018   MWF at [facility]   Fe deficiency anemia 12/23/2018   Hypertension    Legally blind    B/L   NSTEMI (non-ST elevated myocardial infarction) (HCC) 06/25/2023   Pneumonia 2021   Sepsis (HCC) 04/17/2022   Type 2 diabetes mellitus with diabetic peripheral angiopathy without gangrene (HCC) 08/25/2019   Unspecified protein-calorie malnutrition (HCC) 08/25/2019    Past Surgical History:  Procedure Laterality Date   BASCILIC VEIN TRANSPOSITION Right 10/16/2019   Procedure: Basilic Vein Transposition Right Arm;  Surgeon: Serene Gaile ORN, MD;  Location: [facility] OR;  Service: Vascular;  Laterality: Right;   BASCILIC VEIN TRANSPOSITION Right 01/29/2020   Procedure: RIGHT ARM SECOND STAGE BASCILIC VEIN TRANSPOSITION;  Surgeon: Serene Gaile ORN, MD;  Location: [facility] OR;  Service: Vascular;  Laterality: Right;   BIOPSY  12/22/2018   Procedure: BIOPSY;  Surgeon: Celestia Agent, MD;  Location: [facility] ENDOSCOPY;  Service: Endoscopy;;   CORONARY ARTERY BYPASS GRAFT N/A 06/28/2023  Procedure: CORONARY ARTERY BYPASS GRAFTING TIMES THREE USING LEFT INTERNAL MAMMARY ARTERY AND RIGHT GREAT SAPHENOUS VEIN HARVESTED ENDOSCOPICALLY;  Surgeon: Shyrl Linnie KIDD, MD;  Location: [facility] OR;  Service: Open Heart Surgery;   Laterality: N/A;   CORONARY ULTRASOUND/IVUS N/A 12/13/2022   Procedure: Coronary Ultrasound/IVUS;  Surgeon: Dann Candyce RAMAN, MD;  Location: [facility] INVASIVE CV LAB;  Service: Cardiovascular;  Laterality: N/A;   ESOPHAGOGASTRODUODENOSCOPY N/A 12/22/2018   Procedure: ESOPHAGOGASTRODUODENOSCOPY (EGD);  Surgeon: Celestia Agent, MD;  Location: [facility] ENDOSCOPY;  Service: Endoscopy;  Laterality: N/A;   IR FLUORO GUIDE CV LINE RIGHT  07/29/2019   IR US  GUIDE VASC ACCESS RIGHT  07/29/2019   LEFT HEART CATH AND CORONARY ANGIOGRAPHY N/A 12/13/2022   Procedure: LEFT HEART CATH AND CORONARY ANGIOGRAPHY;  Surgeon: Dann Candyce RAMAN, MD;  Location: [facility] INVASIVE CV LAB;  Service: Cardiovascular;  Laterality: N/A;   LEFT HEART CATH AND CORONARY ANGIOGRAPHY N/A 06/25/2023   Procedure: LEFT HEART CATH AND CORONARY ANGIOGRAPHY;  Surgeon: Verlin Lonni BIRCH, MD;  Location: [facility] INVASIVE CV LAB;  Service: Cardiovascular;  Laterality: N/A;   TEE WITHOUT CARDIOVERSION N/A 06/28/2023   Procedure: TRANSESOPHAGEAL ECHOCARDIOGRAM (TEE);  Surgeon: Shyrl Linnie KIDD, MD;  Location: [facility] OR;  Service: Open Heart Surgery;  Laterality: N/A;   TRANSESOPHAGEAL ECHOCARDIOGRAM (CATH LAB) N/A 05/10/2023   Procedure: TRANSESOPHAGEAL ECHOCARDIOGRAM;  Surgeon: Barbaraann Darryle Ned, MD;  Location: [place] INVASIVE CV LAB;  Service: Cardiovascular;  Laterality: N/A;    Social History  reports that he has never smoked. He has never used smokeless tobacco. He reports that he does not currently use alcohol. He reports that he does not currently use drugs after having used the following drugs: Marijuana.  Allergies  Allergen Reactions   Gabapentin      Hallucinations    Oxycodone      Hallucinations    Imdur  [Isosorbide  Nitrate] Other (See Comments)    Endorsed headache in the past - willing to retry    Family History  Problem Relation Age of Onset   CAD Mother    Hypertension Mother    Diabetes Neg Hx    Stroke Neg Hx    Cancer  Neg Hx    Kidney failure Neg Hx    Stomach cancer Neg Hx    Colon cancer Neg Hx    Rectal cancer Neg Hx   Reviewed on admission  Prior to Admission medications   Medication Sig Start Date End Date Taking? Authorizing Provider  aspirin  EC 81 MG tablet Take 1 tablet (81 mg total) by mouth daily. Swallow whole. 07/08/23   Gold, Wayne E, PA-C  atorvastatin  (LIPITOR ) 80 MG tablet Take 1 tablet (80 mg total) by mouth daily. 08/13/23   Samtani, Jai-Gurmukh, MD  calcium  acetate (PHOSLO ) 667 MG capsule Take 3 capsules (2,001 mg total) by mouth with breakfast, with lunch, and with evening meal. 07/08/23   Gold, Lemond BRAVO, PA-C  carvedilol  (COREG ) 12.5 MG tablet Take 2 tablets (25 mg total) by mouth 2 (two) times daily with a meal. Patient not taking: Reported on 11/20/2023 09/01/23 10/01/23  Ellouise Fine K, DO  carvedilol  (COREG ) 25 MG tablet Take 25 mg by mouth 2 (two) times daily with a meal.    [provider]  Cholecalciferol (VITAMIN D3) 50 MCG (2000 UT) capsule Take 2,000 Units by mouth daily. 01/02/24   [provider]  clopidogrel  (PLAVIX ) 75 MG tablet Take 1 tablet (75 mg total) by mouth daily. 07/08/23   Gold, Wayne E, PA-C  famotidine  (  PEPCID ) 20 MG tablet Take 20 mg by mouth daily. 04/20/23   [provider]  ferric citrate  (AURYXIA ) 1 GM 210 MG(Fe) tablet Take 2 tablets (420 mg total) by mouth daily. Patient taking differently: Take 420 mg by mouth 3 (three) times daily with meals. 12/14/22   Tobie Gaines, DO  multivitamin (RENA-VIT) TABS tablet Take 1 tablet by mouth daily. 11/22/22   [provider]  olmesartan  (BENICAR ) 40 MG tablet Take 0.5 tablets (20 mg total) by mouth daily. Patient not taking: Reported on 11/20/2023 09/01/23   Kingsley, Victoria K, DO  pantoprazole  (PROTONIX ) 20 MG tablet Take 20 mg by mouth daily as needed for heartburn. 04/19/23   [provider]  valsartan (DIOVAN) 80 MG tablet Take 80 mg by mouth at bedtime. 01/23/24   [provider]  Vitamin D , Ergocalciferol , (DRISDOL ) 1.25 MG (50000 UNIT) CAPS capsule Take 1 capsule (50,000 Units total) by mouth every 7 (seven) days. 08/19/23   Samtani, Jai-Gurmukh, MD    Physical Exam: Vitals:   02/11/24 1526 02/11/24 1530 02/11/24 1620 02/11/24 1626  BP: (!) 176/90 (!) 176/90 (!) 186/99   Pulse: 87  86   Resp: 16  15   Temp: 98 F (36.7 C) 98 F (36.7 C)  98.1 F (36.7 C)  TempSrc:  Oral  Oral  SpO2: 99%  100%   Weight: 100.3 kg     Height:        Physical Exam Constitutional:      General: He is not in acute distress.    Appearance: Normal appearance.  HENT:     Head: Normocephalic and atraumatic.     Mouth/Throat:     Mouth: Mucous membranes are moist.     Pharynx: Oropharynx is clear.  Eyes:     Extraocular Movements: Extraocular movements intact.     Comments: Blind bilat  Cardiovascular:     Rate and Rhythm: Normal rate and regular rhythm.     Pulses: Normal pulses.     Heart sounds: Murmur heard.  Pulmonary:     Effort: Pulmonary effort is normal. No respiratory distress.     Breath sounds: Normal breath sounds.  Abdominal:     General: Bowel sounds are normal. There is no distension.     Palpations: Abdomen is soft.     Tenderness: There is no abdominal tenderness.  Musculoskeletal:        General: No swelling or deformity.  Skin:    General: Skin is warm and dry.  Neurological:     General: No focal deficit present.     Mental Status: Mental status is at baseline.    Labs on Admission: I have personally reviewed following labs and imaging studies  CBC: Recent Labs  Lab 02/11/24 0619  WBC 7.7  HGB 10.7*  HCT 31.5*  MCV 98.4  PLT 170    Basic Metabolic Panel: Recent Labs  Lab 02/11/24 0619  NA 139  K 5.9*  CL 100  CO2 20*  GLUCOSE 126*  BUN 75*  CREATININE 17.00*  CALCIUM  9.1    GFR: Estimated Creatinine Clearance: 6.3 mL/min (A) (by C-G formula based on SCr of 17 mg/dL (H)).  Liver Function Tests: Recent  Labs  Lab 02/11/24 0620  AST 20  ALT 16  ALKPHOS 72  BILITOT 1.0  PROT 8.1  ALBUMIN  3.5    Urine analysis:    Component Value Date/Time   COLORURINE YELLOW 04/17/2022 1916   APPEARANCEUR CLEAR 04/17/2022 1916  LABSPEC 1.015 04/17/2022 1916   PHURINE 7.0 04/17/2022 1916   GLUCOSEU 50 (A) 04/17/2022 1916   HGBUR SMALL (A) 04/17/2022 1916   BILIRUBINUR NEGATIVE 04/17/2022 1916   KETONESUR NEGATIVE 04/17/2022 1916   PROTEINUR 100 (A) 04/17/2022 1916   NITRITE NEGATIVE 04/17/2022 1916   LEUKOCYTESUR TRACE (A) 04/17/2022 1916    Radiological Exams on Admission: CT Head Wo Contrast Result Date: 02/11/2024 CLINICAL DATA:  53 year old male with chest pain, shortness of breath, acute headache. Hypertensive, 182/90. EXAM: CT HEAD WITHOUT CONTRAST TECHNIQUE: Contiguous axial images were obtained from the base of the skull through the vertex without intravenous contrast. RADIATION DOSE REDUCTION: This exam was performed according to the departmental dose-optimization program which includes automated exposure control, adjustment of the mA and/or kV according to patient size and/or use of iterative reconstruction technique. COMPARISON:  Brain MRI 08/12/2023.  Head CT 09/01/2023. FINDINGS: Brain: Cerebral volume remains normal. No midline shift, ventriculomegaly, mass effect, evidence of mass lesion, intracranial hemorrhage or evidence of cortically based acute infarction. Stable minimal to mild periventricular white matter hypodensity by CT. Maintained gray-white differentiation. Vascular: Severe Calcified atherosclerosis at the skull base. No suspicious intracranial vascular hyperdensity. Skull: Intact skull. Partially visible maxillary alveolus dental disease lucency. No acute osseous abnormality identified. Sinuses/Orbits: Stable mild paranasal sinus mucosal thickening. Tympanic cavities and mastoids remain well aerated. Other: Calcified scalp vessel atherosclerosis. No acute orbit or scalp soft  tissue finding. IMPRESSION: 1. No acute intracranial abnormality. Stable mild for age cerebral white matter changes. 2. Severe calcified atherosclerosis, including scalp vessels, such as due to end-stage renal disease. 3. Partially visible maxillary dental disease. Electronically Signed   By: VEAR Hurst M.D.   On: 02/11/2024 07:17   DG Chest 2 View Result Date: 02/11/2024 CLINICAL DATA:  Chest pain shortness of breath. EXAM: CHEST - 2 VIEW COMPARISON:  11/20/2023 FINDINGS: The cardio pericardial silhouette is enlarged. Vascular congestion with subtle interstitial opacity suggesting edema. No focal consolidation. Small left pleural effusion suspected. Telemetry leads overlie the chest. IMPRESSION: Enlargement of the cardiopericardial silhouette with vascular congestion and subtle interstitial opacity suggesting edema. Electronically Signed   By: Camellia Candle M.D.   On: 02/11/2024 06:44    EKG: Independently reviewed.  Sinus rhythm at 80 bpm.  Nonspecific T wave changes.  Assessment/Plan Principal Problem:   Chest pain, rule out acute myocardial infarction Active Problems:   End stage renal disease on dialysis (HCC)   DM2 (diabetes mellitus, type 2) (HCC)   Chronic systolic heart failure (HCC)   Essential hypertension   GERD (gastroesophageal reflux disease)   HLD (hyperlipidemia)   Anemia   Obesity (BMI 30-39.9)   S/P CABG x 3   Hx of CABG   Blindness of both eyes   Chest pain, rule out ACS CAD Status post CABG earlier this year > Patient presenting with ongoing chest pain. > Says pain started while getting ready for work this morning. > This is in the setting of missing his last dialysis session.  Is on HD MW F last dialysis session was 5 days ago on Wednesday. > Underwent dialysis and had persistent chest pain following this. > Noted to have three-vessel disease last December and underwent CABG at the beginning of this year. > Cardiology consulted and with persistent chest pain and  high risk patient recommended observation and they will see the patient. - Monitor on telemetry overnight - Appreciate cardiology recommendations and assistance - Continue to trend troponin - Repeat echocardiogram -Continue home Coreg , ARB,  atorvastatin , ASA, Plavix  - Supportive care  Eye pain Bilateral blindness > Also reporting some ongoing eye pain.  Seen by ophthalmology in the ED.  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 - Ophthalmology recommended and ordered eyedrops - Continue pain control with as needed pain medicine  ESRD on HD > Presented with evidence of volume overload and underwent dialysis in the ED.  Due to persistent chest pain after dialysis is being admitted for chest pain rule out as above. - Appreciate nephrology recommendations and assistance - Dialysis as indicated - Trend renal function and electrolytes - Continue home PhosLo   Hypertension - Continue home Coreg  - Home valsartan replace with formulary irbesartan   Hyperlipidemia - Continue home atorvastatin   Diabetes - SSI  GERD - Continue home Pepcid  and PPI  Chronic combined systolic and diastolic CHF > Last echo was in December 2024 with EF 40-45%, G2 DD, normal RV function. > Initially volume overloaded, has undergone HD with improvement. - Volume managed with dialysis - Continue Coreg  and ARB  Obesity - Noted   DVT prophylaxis: Heparin   Code Status:   Full Family Communication:  None on admission  Disposition Plan:   Patient is from:  Home  Anticipated DC to:  Home  Anticipated DC date:  1 to 3 days  Anticipated DC barriers: None  Consults called:  Cardiology, nephrology, ophthalmology Admission status:  Observation, telemetry  Severity of Illness: The appropriate patient status for this patient is OBSERVATION. Observation status is judged to be reasonable and necessary in order to provide the required intensity of service to ensure the patient's  safety. The patient's presenting symptoms, physical exam findings, and initial radiographic and laboratory data in the context of their medical condition is felt to place them at decreased risk for further clinical deterioration. Furthermore, it is anticipated that the patient will be medically stable for discharge from the hospital within 2 midnights of admission.    Marsa KATHEE Scurry MD [facility]  How to contact the [facility] Attending or Consulting provider 7A - 7P or covering provider during after hours 7P -7A, for this patient?   Check the care team in [facility] and look for a) attending/consulting [facility] provider listed and b) the [facility] team listed Log into [email or web address] and use [place]'s universal password to access. If you do not have the password, please contact the hospital operator. Locate the [facility] provider you are looking for under [facility] and page to a number that you can be directly reached. If you still have difficulty reaching the provider, please page the [facility] (Director on Call) for the Hospitalists listed on amion for assistance.  02/11/2024, 5:08 PM

## 2024-02-11 NOTE — ED Notes (Signed)
Pt taken to dialysis 

## 2024-02-11 NOTE — ED Provider Notes (Addendum)
 [place] EMERGENCY DEPARTMENT AT [facility] Provider Note   CSN: [id number] Arrival date & time: 02/11/24  9390     Patient presents with: Chest Pain and Shortness of Breath   Corey Park is a 53 y.o. male.    Chest Pain Associated symptoms: headache and shortness of breath   Shortness of Breath Associated symptoms: chest pain and headaches   Patient notes for chest pain.  Medical history includes DM, HTN, HLD, CHF, ESRD, GERD, CAD, asthma, blindness.  This morning, he woke up in his normal state of health.  While getting ready for work, he had onset of left-sided chest pain.  With EMS, patient was hypertensive in the range of 180 SBP.  He was given 324 of ASA prior to arrival.  Currently, he has ongoing left-sided chest pain and endorses headache and left eye pain as well.  Per chart review, he is followed by [facility].  Last office visit was 3 years ago.  He underwent heart cath in December which showed multivessel disease.  Recommendations were for CABG.  He underwent CABG with Dr. Shyrl in January.  He last followed up with CT surgery in February.  He last underwent dialysis on Wednesday.     Prior to Admission medications   Medication Sig Start Date End Date Taking? Authorizing Provider  amoxicillin -clavulanate (AUGMENTIN ) 875-125 MG tablet Take 1 tablet by mouth every 12 (twelve) hours. Patient not taking: Reported on 11/20/2023 09/05/23   Emil Share, DO  aspirin  EC 81 MG tablet Take 1 tablet (81 mg total) by mouth daily. Swallow whole. 07/08/23   Gold, Wayne E, PA-C  atorvastatin  (LIPITOR ) 80 MG tablet Take 1 tablet (80 mg total) by mouth daily. 08/13/23   Samtani, Jai-Gurmukh, MD  calcium  acetate (PHOSLO ) 667 MG capsule Take 3 capsules (2,001 mg total) by mouth with breakfast, with lunch, and with evening meal. 07/08/23   Gold, Lemond BRAVO, PA-C  carvedilol  (COREG ) 12.5 MG tablet Take 2 tablets (25 mg total) by mouth 2 (two) times daily with a meal. Patient not taking:  Reported on 11/20/2023 09/01/23 10/01/23  Kingsley, Victoria K, DO  carvedilol  (COREG ) 25 MG tablet Take 25 mg by mouth 2 (two) times daily with a meal.    [provider]  Cholecalciferol (VITAMIN D3) 50 MCG (2000 UT) capsule Take 2,000 Units by mouth daily. 01/02/24   [provider]  clopidogrel  (PLAVIX ) 75 MG tablet Take 1 tablet (75 mg total) by mouth daily. 07/08/23   Gold, Wayne E, PA-C  famotidine  (PEPCID ) 20 MG tablet Take 20 mg by mouth daily. 04/20/23   [provider]  ferric citrate  (AURYXIA ) 1 GM 210 MG(Fe) tablet Take 2 tablets (420 mg total) by mouth daily. Patient taking differently: Take 420 mg by mouth 3 (three) times daily with meals. 12/14/22   Tobie Gaines, DO  multivitamin (RENA-VIT) TABS tablet Take 1 tablet by mouth daily. 11/22/22   [provider]  olmesartan  (BENICAR ) 40 MG tablet Take 0.5 tablets (20 mg total) by mouth daily. Patient not taking: Reported on 11/20/2023 09/01/23   Kingsley, Victoria K, DO  pantoprazole  (PROTONIX ) 20 MG tablet Take 20 mg by mouth daily as needed for heartburn. 04/19/23   [provider]  trimethoprim -polymyxin b  (POLYTRIM ) ophthalmic solution Place 1 drop into the left eye every 6 (six) hours. Patient not taking: Reported on 11/20/2023 08/13/23   Samtani, Jai-Gurmukh, MD  valsartan (DIOVAN) 80 MG tablet Take 80 mg by mouth at bedtime. 01/23/24  [provider]  Vitamin D , Ergocalciferol , (DRISDOL ) 1.25 MG (50000 UNIT) CAPS capsule Take 1 capsule (50,000 Units total) by mouth every 7 (seven) days. 08/19/23   Samtani, Jai-Gurmukh, MD    Allergies: Gabapentin , Oxycodone , and Imdur  [isosorbide  nitrate]    Review of Systems  Eyes:  Positive for pain.  Respiratory:  Positive for shortness of breath.   Cardiovascular:  Positive for chest pain.  Neurological:  Positive for headaches.  All other systems reviewed and are negative.   Updated Vital Signs BP (!) 169/97   Pulse 78   Temp 98.3 F (36.8 C)  (Oral)   Resp 18   Ht 6' 1 (1.854 m)   Wt 102.1 kg   SpO2 97%   BMI 29.69 kg/m   Physical Exam Vitals and nursing note reviewed.  Constitutional:      General: He is not in acute distress.    Appearance: He is well-developed. He is not ill-appearing, toxic-appearing or diaphoretic.  HENT:     Head: Normocephalic and atraumatic.  Eyes:     Conjunctiva/sclera: Conjunctivae normal.     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  Right eye is normal in appearance.  Left eye has clouding of the lens and firmness.  There is conjunctival injection.  IOP in right eye is 22.  IOP in the left eye shows error message only.  Cardiovascular:     Rate and Rhythm: Normal rate and regular rhythm.     Heart sounds: No murmur heard. Pulmonary:     Effort: Pulmonary effort is normal. No respiratory distress.     Breath sounds: Normal breath sounds.  Chest:     Chest wall: No tenderness.  Abdominal:     Palpations: Abdomen is soft.     Tenderness: There is no abdominal tenderness.  Musculoskeletal:        General: No swelling. Normal range of motion.     Cervical back: Neck supple.  Skin:    General: Skin is warm and dry.     Capillary Refill: Capillary refill takes less than 2 seconds.  Neurological:     General: No focal deficit present.     Mental Status: He is alert and oriented to person, place, and time.  Psychiatric:        Mood and Affect: Mood normal.        Behavior: Behavior normal.     (all labs ordered are listed, but only abnormal results are displayed) Labs Reviewed  BASIC METABOLIC PANEL WITH GFR - Abnormal; Notable for the following components:      Result Value   Potassium 5.9 (*)    CO2 20 (*)    Glucose, Bld 126 (*)    BUN 75 (*)    Creatinine, Ser 17.00 (*)    GFR, Estimated 3 (*)    Anion gap 19 (*)    All other components within normal limits  CBC - Abnormal; Notable for the following components:   RBC 3.20 (*)    Hemoglobin 10.7 (*)    HCT 31.5 (*)     All other components within normal limits  LIPASE, BLOOD - Abnormal; Notable for the following components:   Lipase 81 (*)    All other components within normal limits  BRAIN NATRIURETIC PEPTIDE - Abnormal; Notable for the following components:   B Natriuretic Peptide 1,037.0 (*)    All other components within normal limits  TROPONIN I (HIGH SENSITIVITY) - Abnormal; Notable for the following components:  Troponin I (High Sensitivity) 115 (*)    All other components within normal limits  HEPATIC FUNCTION PANEL    EKG: EKG Interpretation Date/Time:  Monday February 11 2024 06:15:07 EDT Ventricular Rate:  80 PR Interval:  189 QRS Duration:  117 QT Interval:  416 QTC Calculation: 480 R Axis:   -53  Text Interpretation: Sinus rhythm Left anterior fascicular block LVH with secondary repolarization abnormality Anterior ST elevation, probably due to LVH Confirmed by Melvenia Motto [phone]) on 02/11/2024 6:26:42 AM  Radiology: [facility] Chest 2 View Result Date: 02/11/2024 CLINICAL DATA:  Chest pain shortness of breath. EXAM: CHEST - 2 VIEW COMPARISON:  11/20/2023 FINDINGS: The cardio pericardial silhouette is enlarged. Vascular congestion with subtle interstitial opacity suggesting edema. No focal consolidation. Small left pleural effusion suspected. Telemetry leads overlie the chest. IMPRESSION: Enlargement of the cardiopericardial silhouette with vascular congestion and subtle interstitial opacity suggesting edema. Electronically Signed   By: Camellia Candle M.D.   On: 02/11/2024 06:44     Procedures   Medications Ordered in the ED  carvedilol  (COREG ) tablet 25 mg (25 mg Oral Given 02/11/24 0640)  irbesartan  (AVAPRO ) tablet 150 mg (150 mg Oral Given 02/11/24 0640)  timolol  (TIMOPTIC ) 0.5 % ophthalmic solution 1 drop (has no administration in time range)  brimonidine  (ALPHAGAN ) 0.15 % ophthalmic solution 1 drop (has no administration in time range)  pilocarpine  (PILOCAR) 2 % ophthalmic solution 1 drop  (has no administration in time range)  insulin  aspart (novoLOG ) injection 5 Units (has no administration in time range)    And  dextrose  50 % solution 50 mL (has no administration in time range)  sodium zirconium cyclosilicate  (LOKELMA ) packet 10 g (has no administration in time range)  albuterol  (PROVENTIL ) (2.5 MG/3ML) 0.083% nebulizer solution 2.5 mg (has no administration in time range)  HYDROmorphone  (DILAUDID ) injection 0.5 mg (has no administration in time range)  tetracaine  (PONTOCAINE) 0.5 % ophthalmic solution 1 drop (1 drop Both Eyes Given 02/11/24 0644)  fentaNYL  (SUBLIMAZE ) injection 50 mcg (50 mcg Intravenous Given 02/11/24 0641)  nitroGLYCERIN  (NITROGLYN) 2 % ointment 1 inch (1 inch Topical Given 02/11/24 0640)  metoCLOPramide  (REGLAN ) injection 10 mg (10 mg Intravenous Given 02/11/24 0651)  diphenhydrAMINE  (BENADRYL ) injection 12.5 mg (12.5 mg Intravenous Given 02/11/24 9348)                                    Medical Decision Making Amount and/or Complexity of Data Reviewed Labs: ordered. Radiology: ordered.  Risk OTC drugs. Prescription drug management.   This patient presents to the ED for concern of chest pain, headache, left eye pain, this involves an extensive number of treatment options, and is a complaint that carries with it a high risk of complications and morbidity.  The differential diagnosis includes ACS, pericarditis, hypertensive crisis, GERD acute glaucoma, iritis, foreign body, conjunctivitis, uveitis   Co morbidities / Chronic conditions that complicate the patient evaluation  DM, HTN, HLD, CHF, ESRD, GERD, CAD, asthma, blindness   Additional history obtained:  Additional history obtained from EMR External records from outside source obtained and reviewed including EMS   Lab Tests:  I Ordered, and personally interpreted labs.  The pertinent results include: Hyperkalemia is present.  Troponin and BNP are elevated but consistent with prior lab  work.   Imaging Studies ordered:  I ordered imaging studies including chest x-ray, CT head I independently visualized and interpreted imaging which showed cardiomegaly,  vascular congestion, and edema on x-ray; CT pending at time of signout. I agree with the radiologist interpretation   Cardiac Monitoring: / EKG:  The patient was maintained on a cardiac monitor.  I personally viewed and interpreted the cardiac monitored which showed an underlying rhythm of: Sinus rhythm   Problem List / ED Course / Critical interventions / Medication management  Patient presenting for acute onset of left-sided chest pain this morning, 1 hour prior to arrival.  He last underwent dialysis 5 days ago.  Vital signs with EMS notable for hypertension.  In addition to chest pain, patient endorsing headache and left eye pain.  Direct inspection of eye shows lacrimation clouding of lens and firmness to eyeball.  He is blind at baseline.  Will check IOP's.  Patient's EKG shows no evidence of STEMI.  He underwent CABG and January of this year.  He was given ASA with EMS.  Fentanyl  was ordered for analgesia.  Home blood pressure medications and NTG ordered as well.  Workup was initiated.  IOP in right eye is normal.  IOP in left eye is unable to be obtained due to error message.  Patient does state that he has had mild throbbing pain in his left eye for the past month.  Pain only became severe today.  Eyedrops were ordered for treatment of acute glaucoma.  Ophthalmology was consulted.  Patient's lab work notable for hyperkalemia.  Although troponin and BNP are elevated, this does seem to be consistent with prior lab work.  Temporizing medication for hyperkalemia were ordered.  I spoke with ophthalmologist on-call, Dr. McCuen, who recommends brimonidine , dorzolamide , latanoprost , and timolol .  Medications were ordered.  Optho will plan on seeing patient later today.  Given hyperkalemia, patient will require dialysis.  Care of  patient was signed out to oncoming ED provider. I ordered medication including fentanyl  for analgesia, NTG, Coreg , Avapro  for HTN; Reglan  and Benadryl  for headache; timolol , brimonidine , pilocarpine  for acute glaucoma; albuterol , dextrose /insulin , Lokelma  for hyperkalemia Reevaluation of the patient after these medicines showed that the patient improved I have reviewed the patients home medicines and have made adjustments as needed   Consultations Obtained:  I requested consultation with the ophthalmologist,  and discussed lab and imaging findings as well as pertinent plan - they recommend: Initiate timolol , latanoprost , dorzolamide , and brimonidine .  Optho will see in consult.  Social Determinants of Health:  Blind at baseline  CRITICAL CARE Performed by: Bernardino Fireman   Total critical care time: 32 minutes  Critical care time was exclusive of separately billable procedures and treating other patients.  Critical care was necessary to treat or prevent imminent or life-threatening deterioration.  Critical care was time spent personally by me on the following activities: development of treatment plan with patient and/or surrogate as well as nursing, discussions with consultants, evaluation of patient's response to treatment, examination of patient, obtaining history from patient or surrogate, ordering and performing treatments and interventions, ordering and review of laboratory studies, ordering and review of radiographic studies, pulse oximetry and re-evaluation of patient's condition.      Final diagnoses:  Chest pain, unspecified type  Hypertension, unspecified type  Hyperkalemia  Acute glaucoma of left eye    ED Discharge Orders     None          Fireman Bernardino, MD 02/11/24 9282    Fireman Bernardino, MD 02/11/24 KELLY    Fireman Bernardino, MD 02/11/24 [phone]

## 2024-02-11 NOTE — ED Notes (Signed)
 CCMD called to place the patient on cardiac monitoring services.

## 2024-02-12 ENCOUNTER — Observation Stay (HOSPITAL_BASED_OUTPATIENT_CLINIC_OR_DEPARTMENT_OTHER)

## 2024-02-12 DIAGNOSIS — R0789 Other chest pain: Secondary | ICD-10-CM | POA: Diagnosis not present

## 2024-02-12 DIAGNOSIS — R079 Chest pain, unspecified: Secondary | ICD-10-CM | POA: Diagnosis not present

## 2024-02-12 DIAGNOSIS — Z951 Presence of aortocoronary bypass graft: Secondary | ICD-10-CM | POA: Diagnosis not present

## 2024-02-12 LAB — COMPREHENSIVE METABOLIC PANEL WITH GFR
ALT: 15 U/L (ref 0–44)
AST: 19 U/L (ref 15–41)
Albumin: 3.2 g/dL — ABNORMAL LOW (ref 3.5–5.0)
Alkaline Phosphatase: 62 U/L (ref 38–126)
Anion gap: 15 (ref 5–15)
BUN: 51 mg/dL — ABNORMAL HIGH (ref 6–20)
CO2: 25 mmol/L (ref 22–32)
Calcium: 9.1 mg/dL (ref 8.9–10.3)
Chloride: 95 mmol/L — ABNORMAL LOW (ref 98–111)
Creatinine, Ser: 13.59 mg/dL — ABNORMAL HIGH (ref 0.61–1.24)
GFR, Estimated: 4 mL/min — ABNORMAL LOW (ref >60)
Glucose, Bld: 132 mg/dL — ABNORMAL HIGH (ref 70–99)
Potassium: 5 mmol/L (ref 3.5–5.1)
Sodium: 135 mmol/L (ref 135–145)
Total Bilirubin: 1.1 mg/dL (ref 0.0–1.2)
Total Protein: 7.6 g/dL (ref 6.5–8.1)

## 2024-02-12 LAB — ECHOCARDIOGRAM COMPLETE
AR max vel: 2.83 cm2
AV Area VTI: 2.71 cm2
AV Area mean vel: 2.62 cm2
AV Mean grad: 4.5 mmHg
AV Peak grad: 8.5 mmHg
Ao pk vel: 1.46 m/s
Area-P 1/2: 3.6 cm2
Calc EF: 41.6 %
Height: 73 in
S' Lateral: 3.85 cm
Single Plane A2C EF: 37.9 %
Single Plane A4C EF: 44.8 %
Weight: 3675.51 [oz_av]

## 2024-02-12 LAB — CBC
HCT: 31.8 % — ABNORMAL LOW (ref 39.0–52.0)
Hemoglobin: 10.9 g/dL — ABNORMAL LOW (ref 13.0–17.0)
MCH: 33.1 pg (ref 26.0–34.0)
MCHC: 34.3 g/dL (ref 30.0–36.0)
MCV: 96.7 fL (ref 80.0–100.0)
Platelets: 174 K/uL (ref 150–400)
RBC: 3.29 MIL/uL — ABNORMAL LOW (ref 4.22–5.81)
RDW: 14.8 % (ref 11.5–15.5)
WBC: 6 K/uL (ref 4.0–10.5)
nRBC: 0 % (ref 0.0–0.2)

## 2024-02-12 LAB — GLUCOSE, CAPILLARY
Glucose-Capillary: 94 mg/dL (ref 70–99)
Glucose-Capillary: 119 mg/dL — ABNORMAL HIGH (ref 70–99)
Glucose-Capillary: 141 mg/dL — ABNORMAL HIGH (ref 70–99)
Glucose-Capillary: 134 mg/dL — ABNORMAL HIGH (ref 70–99)

## 2024-02-12 LAB — HEPATITIS B SURFACE ANTIBODY, QUANTITATIVE: Hep B S AB Quant (Post): 112 m[IU]/mL

## 2024-02-12 MED ORDER — POLYETHYLENE GLYCOL 3350 17 G PO PACK
17.0000 g | PACK | Freq: Every day | ORAL | Status: DC
  Administered 2024-02-12 – 2024-02-14 (×2): 17 g via ORAL
  Filled 2024-02-12 (×3): qty 1

## 2024-02-12 MED ORDER — SENNA 8.6 MG PO TABS
2.0000 | ORAL_TABLET | Freq: Every evening | ORAL | Status: DC | PRN
  Administered 2024-02-12: 17.2 mg via ORAL
  Filled 2024-02-12: qty 2

## 2024-02-12 MED ORDER — PREDNISOLONE ACETATE 1 % OP SUSP
1.0000 [drp] | OPHTHALMIC | Status: DC
  Administered 2024-02-12 – 2024-02-14 (×9): 1 [drp] via OPHTHALMIC
  Filled 2024-02-12: qty 5

## 2024-02-12 MED ORDER — ATROPINE SULFATE 1 % OP SOLN
1.0000 [drp] | Freq: Every day | OPHTHALMIC | Status: DC
  Administered 2024-02-12 – 2024-02-14 (×3): 1 [drp] via OPHTHALMIC
  Filled 2024-02-12: qty 2

## 2024-02-12 NOTE — Progress Notes (Signed)
*  PRELIMINARY RESULTS* Echocardiogram 2D Echocardiogram has been performed.  Corey Park 02/12/2024, 11:10 AM

## 2024-02-12 NOTE — Progress Notes (Signed)
 PROGRESS NOTE Corey Park  FMW:[id number] DOB: [birth year] DOA: 02/11/2024 PCP: Lorren Greig PARAS, NP  Brief Narrative/Hospital Course: 53 y.o. male with medical history significant of hypertension, hyperlipidemia, diabetes, GERD, CAD status post CABG earlier this year, anemia, chronic combined systolic and diastolic CHF, bilateral blindness, ESRD on HD, obesity presentined with chest pain and shortness of breath while getting ready for work 8/18. EMS was called and patient noted to be hypertensive in the 180s systolic.  Patient received aspirin  en route to the ED. He also reported headache left eye pain.  In the ED await further workup seen by nephrology ophthalmology, cardiology In the ED hemodynamically stable labs with hyperkalemia elevated troponin but flat Nephrology consulted and patient went for dialysis which was successful.Had 2 L removed but had continued chest pain after. CXR>cardiomegaly with changes consistent with pulmonary edema. CT head>no acute abnormality, showed severe atherosclerosis. Patient received fentanyl , Dilaudid , Coreg , irbesartan , insulin , glucose, Lokelma , albuterol , Reglan  in the Ed was srted on eyedrops. Cardiology consulted recommending admission to observe given his persistent chest pain and recent CABG.   Subjective: Seen and examined today Overnight afebrile BP stable on room air Labs reviewed potassium 5 BUN/creat 15/13.5 hemoglobin 10.9 stable Hs troponin 105-100-94 Complains of some headache eye pain and chest pain.   Wife at bedside  Assessment and plan:  Atypical chest pain, pleuritic CAD recent CABG Elevated troponin-flat: Cardiology input appreciated troponin has been flat and downtrending, no evidence of ACS, follow-up echo to r/o pericardial issues Await cardiology recommendation, cpmt pain management and blood pressure control-continue home aspirin  Plavix  and statin.  Hypertension: poorly controlled.continue Coreg  25, Avapro   EESRD on  HD MWF: Nephrology following for HD- last one 8/18  DM2: Well-controlled.  Continue SSI Recent Labs  Lab 02/11/24 0919 02/11/24 2122 02/12/24 0610 02/12/24 1107  GLUCAP 103* 124* 94 119*    Chronic systolic heart failure: Follow-up echo, address fluid with dialysis  GERD: Continue Pepcid   Anemia: Stable  Blindness of both eyes Headache and eye pain: Continue eyedrops to lower pressure for patient's glaucoma,, and erythromycin  and pain management as prescribed by ophthalmO if pain cannot be controlled medically consider IV sedation with oculoplastics   Class I Obesity w/ Body mass index is 30.31 kg/m.: Will benefit with PCP follow-up, weight loss,healthy lifestyle and outpatient sleep eval if not done.  Mobility: Independent PT Orders: active PT Follow up Rec:    DVT prophylaxis: heparin  injection 5,000 Units Start: 02/11/24 1715 Code Status:   Code Status: Full Code Family Communication: plan of care discussed with patient/wife at bedside. Patient status is: Remains hospitalized because of severity of illness Level of care: Telemetry Cardiac   Dispo: The patient is from: home having transportation issues.            Anticipated disposition: TBD Objective: Vitals last 24 hrs: Vitals:   02/11/24 2315 02/12/24 0539 02/12/24 0707 02/12/24 1109  BP: 109/66 114/84 (!) 140/84 (!) 159/83  Pulse: 70 71 77 75  Resp: 11 14 18 19   Temp: 97.6 F (36.4 C) 97.7 F (36.5 C) 97.7 F (36.5 C) 98.1 F (36.7 C)  TempSrc: Oral Oral Oral Oral  SpO2: 100% 96% 97% 98%  Weight:      Height:        Physical Examination: General exam: alert awake, oriented, older than stated age HEENT:Oral mucosa moist, Ear/Nose WNL grossly Respiratory system: Bilaterally clear BS,no use of accessory muscle Cardiovascular system: S1 & S2 +, No JVD. Gastrointestinal system: Abdomen soft,NT,ND, BS+ Nervous  System: Alert, awake, moving all extremities,and following commands blind. Extremities:  LE edema neg, distal extremities warm.  Skin: No rashes,no icterus. MSK: Normal muscle bulk,tone, power   Medications reviewed:  Scheduled Meds:  atorvastatin   80 mg Oral Daily   brimonidine   1 drop Left Eye TID   calcium  acetate  2,001 mg Oral TID with meals   carvedilol   25 mg Oral BID WC   Chlorhexidine  Gluconate Cloth  6 each Topical Q0600   clopidogrel   75 mg Oral Daily   dorzolamide   1 drop Left Eye TID   erythromycin    Left Eye TID   famotidine   20 mg Oral Daily   feeding supplement  237 mL Oral BID BM   heparin   5,000 Units Subcutaneous Q8H   insulin  aspart  0-6 Units Subcutaneous TID WC   irbesartan   75 mg Oral Daily   latanoprost   1 drop Left Eye Daily   multivitamin  1 tablet Oral Daily   pantoprazole   20 mg Oral Daily   timolol   1 drop Left Eye BID   Continuous Infusions: Diet: Diet Order             Diet Heart Room service appropriate? Yes; Fluid consistency: Thin; Fluid restriction: 2000 mL Fluid  Diet effective now                    Data Reviewed: I have personally reviewed following labs and imaging studies ( see epic result tab) CBC: Recent Labs  Lab 02/11/24 0619 02/12/24 0222  WBC 7.7 6.0  HGB 10.7* 10.9*  HCT 31.5* 31.8*  MCV 98.4 96.7  PLT 170 174   CMP: Recent Labs  Lab 02/11/24 0619 02/12/24 0222  NA 139 135  K 5.9* 5.0  CL 100 95*  CO2 20* 25  GLUCOSE 126* 132*  BUN 75* 51*  CREATININE 17.00* 13.59*  CALCIUM  9.1 9.1   GFR: Estimated Creatinine Clearance: 8 mL/min (A) (by C-G formula based on SCr of 13.59 mg/dL (H)). Recent Labs  Lab 02/11/24 0620 02/12/24 0222  AST 20 19  ALT 16 15  ALKPHOS 72 62  BILITOT 1.0 1.1  PROT 8.1 7.6  ALBUMIN  3.5 3.2*    Recent Labs  Lab 02/11/24 0620  LIPASE 81*   No results for input(s): AMMONIA in the last 168 hours. Coagulation Profile: No results for input(s): INR, PROTIME in the last 168 hours. Unresulted Labs (From admission, onward)    None       Antimicrobials/Microbiology: Anti-infectives (From admission, onward)    None         Component Value Date/Time   SDES BLOOD LEFT ANTECUBITAL 05/09/2023 1822   SPECREQUEST  05/09/2023 1822    BOTTLES DRAWN AEROBIC ONLY Blood Culture adequate volume   CULT  05/09/2023 1822    NO GROWTH 5 DAYS Performed at [facility], [address], [place], [place] [address]    REPTSTATUS 05/14/2023 FINAL 05/09/2023 8177  Procedures:  Mennie LAMY, MD [facility] 02/12/2024, 2:00 PM

## 2024-02-12 NOTE — Care Management Obs Status (Signed)
 MEDICARE OBSERVATION STATUS NOTIFICATION   Patient Details  Name: Corey Park MRN: [id number] Date of Birth: 07-Dec-1970   Medicare Observation Status Notification Given:  Yes  Obs leter signed and copy given   Claretta Deed 02/12/2024, 12:31 PM

## 2024-02-12 NOTE — Progress Notes (Addendum)
 Rounding Note   Patient Name: Corey Park Date of Encounter: 02/12/2024  [facility] Cardiologist: Oneil Parchment, MD   Subjective CP has improved, though shortness of breath is still present. Headache ongoing.  Shared he has not had a BM in 4 days  Shared that while his son and his church assist him at home, he feels that he does need more assistance would be interested in home health Patient missed his last HD session (first and only one he has missed) due to transportation issues, the driver apparently is new and forgot to pick him up. Patient shared he does not miss HD, he is in the process of renal transplant work-up    Scheduled Meds:  aspirin  EC  81 mg Oral Daily   atorvastatin   80 mg Oral Daily   brimonidine   1 drop Left Eye TID   calcium  acetate  2,001 mg Oral TID with meals   carvedilol   25 mg Oral BID WC   Chlorhexidine  Gluconate Cloth  6 each Topical Q0600   clopidogrel   75 mg Oral Daily   dorzolamide   1 drop Left Eye TID   erythromycin    Left Eye TID   famotidine   20 mg Oral Daily   feeding supplement  237 mL Oral BID BM   heparin   5,000 Units Subcutaneous Q8H   insulin  aspart  0-6 Units Subcutaneous TID WC   irbesartan   75 mg Oral Daily   latanoprost   1 drop Left Eye Daily   multivitamin  1 tablet Oral Daily   pantoprazole   20 mg Oral Daily   timolol   1 drop Left Eye BID   Continuous Infusions:  PRN Meds: acetaminophen , HYDROcodone -acetaminophen , HYDROmorphone  (DILAUDID ) injection, ondansetron  (ZOFRAN ) IV   Vital Signs  Vitals:   02/11/24 1915 02/11/24 2315 02/12/24 0539 02/12/24 0707  BP: (!) 177/96 109/66 114/84 (!) 140/84  Pulse: 80 70 71 77  Resp: 20 11 14 18   Temp: 98.9 F (37.2 C) 97.6 F (36.4 C) 97.7 F (36.5 C) 97.7 F (36.5 C)  TempSrc: Oral Oral Oral Oral  SpO2: 96% 100% 96% 97%  Weight: 104.2 kg     Height:        Intake/Output Summary (Last 24 hours) at 02/12/2024 0751 Last data filed at 02/11/2024 2100 Gross per 24 hour   Intake 240 ml  Output 2600 ml  Net -2360 ml      02/11/2024    7:15 PM 02/11/2024    3:26 PM 02/11/2024    6:13 AM  Last 3 Weights  Weight (lbs) 229 lb 11.5 oz 221 lb 1.9 oz 225 lb  Weight (kg) 104.2 kg 100.3 kg 102.059 kg      Telemetry Sinus with TWI and PVC HR ~70 - Personally Reviewed   Physical Exam GEN: No acute distress.   Cardiac: RRR  Respiratory: Clear to auscultation bilaterally. MS: No edema; No deformity. Neuro:  Nonfocal  Psych: Normal affect   Labs High Sensitivity Troponin:   Recent Labs  Lab 02/11/24 0619 02/11/24 0817 02/11/24 1818 02/11/24 2029  TROPONINIHS 115* 100* 95* 94*     Chemistry Recent Labs  Lab 02/11/24 0619 02/11/24 0620 02/12/24 0222  NA 139  --  135  K 5.9*  --  5.0  CL 100  --  95*  CO2 20*  --  25  GLUCOSE 126*  --  132*  BUN 75*  --  51*  CREATININE 17.00*  --  13.59*  CALCIUM  9.1  --  9.1  PROT  --  8.1 7.6  ALBUMIN   --  3.5 3.2*  AST  --  20 19  ALT  --  16 15  ALKPHOS  --  72 62  BILITOT  --  1.0 1.1  GFRNONAA 3*  --  4*  ANIONGAP 19*  --  15    Lipids No results for input(s): CHOL, TRIG, HDL, LABVLDL, LDLCALC, CHOLHDL in the last 168 hours.  Hematology Recent Labs  Lab 02/11/24 0619 02/12/24 0222  WBC 7.7 6.0  RBC 3.20* 3.29*  HGB 10.7* 10.9*  HCT 31.5* 31.8*  MCV 98.4 96.7  MCH 33.4 33.1  MCHC 34.0 34.3  RDW 15.2 14.8  PLT 170 174   Thyroid No results for input(s): TSH, FREET4 in the last 168 hours.  BNP Recent Labs  Lab 02/11/24 0621  BNP 1,037.0*    DDimer No results for input(s): DDIMER in the last 168 hours.   Radiology  CT Head Wo Contrast Result Date: 02/11/2024 CLINICAL DATA:  53 year old male with chest pain, shortness of breath, acute headache. Hypertensive, 182/90. EXAM: CT HEAD WITHOUT CONTRAST TECHNIQUE: Contiguous axial images were obtained from the base of the skull through the vertex without intravenous contrast. RADIATION DOSE REDUCTION: This exam was  performed according to the departmental dose-optimization program which includes automated exposure control, adjustment of the mA and/or kV according to patient size and/or use of iterative reconstruction technique. COMPARISON:  Brain MRI 08/12/2023.  Head CT 09/01/2023. FINDINGS: Brain: Cerebral volume remains normal. No midline shift, ventriculomegaly, mass effect, evidence of mass lesion, intracranial hemorrhage or evidence of cortically based acute infarction. Stable minimal to mild periventricular white matter hypodensity by CT. Maintained gray-white differentiation. Vascular: Severe Calcified atherosclerosis at the skull base. No suspicious intracranial vascular hyperdensity. Skull: Intact skull. Partially visible maxillary alveolus dental disease lucency. No acute osseous abnormality identified. Sinuses/Orbits: Stable mild paranasal sinus mucosal thickening. Tympanic cavities and mastoids remain well aerated. Other: Calcified scalp vessel atherosclerosis. No acute orbit or scalp soft tissue finding. IMPRESSION: 1. No acute intracranial abnormality. Stable mild for age cerebral white matter changes. 2. Severe calcified atherosclerosis, including scalp vessels, such as due to end-stage renal disease. 3. Partially visible maxillary dental disease. Electronically Signed   By: VEAR Hurst M.D.   On: 02/11/2024 07:17   DG Chest 2 View Result Date: 02/11/2024 CLINICAL DATA:  Chest pain shortness of breath. EXAM: CHEST - 2 VIEW COMPARISON:  11/20/2023 FINDINGS: The cardio pericardial silhouette is enlarged. Vascular congestion with subtle interstitial opacity suggesting edema. No focal consolidation. Small left pleural effusion suspected. Telemetry leads overlie the chest. IMPRESSION: Enlargement of the cardiopericardial silhouette with vascular congestion and subtle interstitial opacity suggesting edema. Electronically Signed   By: Camellia Candle M.D.   On: 02/11/2024 06:44    Cardiac Studies Echocardiogram  05/2023 IMPRESSIONS     1. Left ventricular ejection fraction, by estimation, is 40 to 45%. The  left ventricle has mildly decreased function. The left ventricle  demonstrates regional wall motion abnormalities (see scoring  diagram/findings for description). Left ventricular  diastolic parameters are consistent with Grade II diastolic dysfunction  (pseudonormalization). GLS -8.6%.   2. Right ventricular systolic function is normal. The right ventricular  size is normal.   3. The mitral valve is normal in structure. No evidence of mitral valve  regurgitation. No evidence of mitral stenosis.   4. The aortic valve is normal in structure. Aortic valve regurgitation is  not visualized. No aortic stenosis is present.  5. The inferior vena cava is normal in size with greater than 50%  respiratory variability, suggesting right atrial pressure of 3 mmHg.   Patient Profile   53 y.o. 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 Patient was hypertensive in the ED (183/101), Troponin 115 -> 100. BNP 1037. Cardiology was consulted. Additionally, patient recently missed a dialysis session, nephrology consulted.   Assessment & Plan  Atypical chest pain - pleuritic  Patient reported improvement in his chest pain since admission. Troponin elevation consistent with ESRD  Echocardiogram pending to rule out pericardial effusion/pericarditis.  Will follow-up on these results, if echo is okay otherwise no further workup needed.   Hypertension BP: 140/84 Improved from admission after HD dialysis session and initiation of patient's PTA medications Continue coreg  25 mg BID Continue irbesartan  75 mg  CAD s/p CABG in 06/2023 Hyperlipidemia Discontinue aspirin  81 mg, patient was to  have DAPT for 6 months after his CABG.  We are now out of that window. Continue plavix  75 mg Continue lipitor  80 mg  Systolic and Diastolic HF In 2021 EF was 20-25% and Grade II Diastolic Dysfunction noted. His EF has improved to 40-45% as of 12/24 Elevated BNP on admission, though in the setting of missed dialysis CXR shows evidence of vascular congestion, though ^^^ Echocardiogram pending   On exam appears euvolemic Fluid management per nephrology Continue coreg  25 mg BID Continue irbesartan  75 mg Defer SGLT2i and MRA 2/2 renal function  Patient has chronic anemia due to ESRD, if shortness of breath is not improved with fluid management could consider IV iron  outpatient.  ESRD on MWF Patient missed HD session last Friday due to transportation issues. Received HD yesterday  Resolved hyperkalemia  Per nephrology  Blindness Patient reports that he is able to live independently with help of his son and church members, however he would be interested in home health.  Will reach out to hospitalist team to help us  to facilitate this.  Per primary Eye pain Headache T2DM GERD  Will Follow-up on echocardiogram today, anticipate cardiology sign-off pending those results. Please place Home Health consult for patient.    For questions or updates, please contact [facility] Please consult [email or web address] for contact info under     Signed, Leontine LOISE Salen, PA-C  02/12/2024, 7:51 AM    Agree with note by Leontine Salen, PA-C  We were asked to see this gentleman for atypical chest pain.  He is 8 months status post CABG.  He does have bilateral blindness, chronic anemia and end-stage renal disease as well as type 2 diabetes and hypertension.  He had missed dialysis the prior Friday for logistical reasons.  He came in volume overloaded with a serum creatinine of 17 which fell to 13 after dialysis.  He was he was also hypertensive.  His pain was more pleuritic.  His enzymes were  elevated in the low 100 range but flat.  He is pain-free this morning.  His exam is benign.  He is breathing better.  His blood pressures are better controlled.  Plan 2D echo to rule out uremic pericarditis.  If this is normal we will sign off.  Dorn DOROTHA Lesches, M.D., FACP, [facility], FAHA, [facility]  [address] [place], [place]  [address]  [phone] 02/12/2024 12:40 PM

## 2024-02-12 NOTE — TOC CM/SW Note (Addendum)
 Transition of Care [facility]) - Inpatient Brief Assessment   Patient Details  Name: Rye Decoste MRN: [id number] Date of Birth: 20-Jan-1971  Transition of Care [facility]) CM/SW Contact:    Lauraine FORBES Saa, LCSWA Phone Number: 02/12/2024, 4:09 PM   Clinical Narrative:  4:09 PM Per medical team, patient expressed difficulty in attending appointments. CSW followed up with patient who confirmed transportation needs and that they could be read to him by family/friends. CSW provided transportation resources.  Transition of Care Asessment: Insurance and Status: Insurance coverage has been reviewed Patient has primary care physician: Yes Home environment has been reviewed: Private Residence Prior level of function:: N/A Prior/Current Home Services: No current home services Social Drivers of Health Review: SDOH reviewed interventions complete Readmission risk has been reviewed: Yes (Currently Observation Status) Transition of care needs: no transition of care needs at this time

## 2024-02-12 NOTE — Progress Notes (Signed)
 Ophthalmology progress note  Follow up neovascular glaucoma OS  Corey Park is an 53 y.o. male.  He reports that his left eye is still very painful despite the IOP lowering drops     Current Facility-Administered Medications  Medication Dose Route Frequency Provider Last Rate Last Admin   acetaminophen  (TYLENOL ) tablet 650 mg  650 mg Oral Q4H PRN Melvin, Alexander B, MD       atorvastatin  (LIPITOR ) tablet 80 mg  80 mg Oral Daily Melvin, Alexander B, MD   80 mg at 02/12/24 0900   brimonidine  (ALPHAGAN ) 0.15 % ophthalmic solution 1 drop  1 drop Left Eye TID Melvin, Alexander B, MD   1 drop at 02/12/24 1537   calcium  acetate (PHOSLO ) capsule 2,001 mg  2,001 mg Oral TID with meals Seena Marsa NOVAK, MD   2,001 mg at 02/12/24 1622   carvedilol  (COREG ) tablet 25 mg  25 mg Oral BID WC Melvenia Motto, MD   25 mg at 02/12/24 1536   Chlorhexidine  Gluconate Cloth 2 % PADS 6 each  6 each Topical Q0600 Melvin, Alexander B, MD   6 each at 02/12/24 0541   clopidogrel  (PLAVIX ) tablet 75 mg  75 mg Oral Daily Melvin, Alexander B, MD   75 mg at 02/12/24 0900   dorzolamide  (TRUSOPT ) 2 % ophthalmic solution 1 drop  1 drop Left Eye TID Melvin, Alexander B, MD   1 drop at 02/12/24 1537   erythromycin  ophthalmic ointment   Left Eye TID Elkin Belfield, MD   1 Application at 02/12/24 1537   famotidine  (PEPCID ) tablet 20 mg  20 mg Oral Daily Melvin, Alexander B, MD   20 mg at 02/12/24 0859   feeding supplement (ENSURE PLUS HIGH PROTEIN) liquid 237 mL  237 mL Oral BID BM Melvin, Alexander B, MD   237 mL at 02/12/24 1343   heparin  injection 5,000 Units  5,000 Units Subcutaneous Q8H Seena Marsa NOVAK, MD   5,000 Units at 02/12/24 1343   HYDROcodone -acetaminophen  (NORCO/VICODIN) 5-325 MG per tablet 1 tablet  1 tablet Oral Q6H PRN Melvin, Alexander B, MD   1 tablet at 02/12/24 1536   HYDROmorphone  (DILAUDID ) injection 0.5 mg  0.5 mg Intravenous Q4H PRN Seena Marsa NOVAK, MD   0.5 mg at 02/12/24 1625   insulin   aspart (novoLOG ) injection 0-6 Units  0-6 Units Subcutaneous TID WC Melvin, Alexander B, MD       irbesartan  (AVAPRO ) tablet 75 mg  75 mg Oral Daily Melvin, Alexander B, MD   75 mg at 02/12/24 0900   latanoprost  (XALATAN ) 0.005 % ophthalmic solution 1 drop  1 drop Left Eye Daily Seena Marsa NOVAK, MD   1 drop at 02/12/24 9093   multivitamin (RENA-VIT) tablet 1 tablet  1 tablet Oral Daily Melvin, Alexander B, MD   1 tablet at 02/12/24 0900   ondansetron  (ZOFRAN ) injection 4 mg  4 mg Intravenous Q6H PRN Melvin, Alexander B, MD       pantoprazole  (PROTONIX ) EC tablet 20 mg  20 mg Oral Daily Melvin, Alexander B, MD   20 mg at 02/12/24 0900   polyethylene glycol (MIRALAX  / GLYCOLAX ) packet 17 g  17 g Oral Daily Segars, Dorn, MD       senna (SENOKOT) tablet 17.2 mg  2 tablet Oral QHS PRN Segars, Jonathan, MD       timolol  (TIMOPTIC ) 0.5 % ophthalmic solution 1 drop  1 drop Left Eye BID Seena Marsa NOVAK, MD   1 drop at 02/12/24 [id number]  Allergies  Allergen Reactions   Gabapentin      Hallucinations    Oxycodone      Hallucinations    Imdur  [Isosorbide  Nitrate] Other (See Comments)    Endorsed headache in the past - willing to retry      Physical Exam:  Blood pressure (!) 145/83, pulse 73, temperature 98.2 F (36.8 C), temperature source Oral, resp. rate 20, height 6' 1 (1.854 m), weight 104.2 kg, SpO2 98%.   VA:  OD: Bare light perception  OS:  No light perception    Pupils:   OD round, poorly reactive to light           OS poor view through cloudy cornea  IOP (T pen)  OD 20  OS 46    Motility:  OD full ductions  OS full ductions                                  OD                                       External/adnexa: Normal                                      Lids/lashes:        Normal                                      Conjunctiva        White, quiet        Cornea:              Clear                  AC:                     Deep,                                Iris:                      Normal        Lens:                  NS                                      OS                                       External/adnexa: Normal                                      Lids/lashes:        Normal  Conjunctiva        White, quiet        Cornea:              cloudy, no staining                  AC:                     formed                             Iris:                     poor view        Lens:                  poor view                                 Assessment and Plan: Blind painful eye OS.  Presumably neovascular glaucoma.  (History of diabetic retinopathy without treatment.)  Patient has been blind OS for years.  Goal is to control pain.  IOP is a little lower, and cornea looks better than yesterday (No corneal staining today)  Plan:  Discontinue erythromycin  ointment.  Start Atropine  1% OS daily.  Start Prednisolone  OS qid.  Hopefully, patient can be discharged for outpatient evisceration with oculoplastics.  Herlinda Heady L 02/12/2024, 9:07 PM  [facility] Ophthalmology [phone]

## 2024-02-12 NOTE — TOC CM/SW Note (Signed)
 Transition of Care [facility]) - Inpatient Brief Assessment   Patient Details  Name: Corey Park MRN: [id number] Date of Birth: Dec 09, 1970  Transition of Care [facility]) CM/SW Contact:    Lauraine FORBES Saa, LCSWA Phone Number: 02/12/2024, 9:28 AM   Clinical Narrative:  9:28 AM Per chart review, patient resides at home alone. Patient has a PCP and insurance. Patient does not have SNF history. Patient has HH history with [facility] and DME (oxygen, rolling walker) history with [facility]. Patient's preferred pharmacy's are [facility] [address] and [address] [address] [place]. No TOC needs were identified at this time. TOC will continue to follow and be available to assist.  Transition of Care Asessment: Insurance and Status: Insurance coverage has been reviewed Patient has primary care physician: Yes Home environment has been reviewed: Private Residence Prior level of function:: N/A Prior/Current Home Services: No current home services Social Drivers of Health Review: SDOH reviewed no interventions necessary Readmission risk has been reviewed: Yes (Currently Observation Status) Transition of care needs: no transition of care needs at this time

## 2024-02-12 NOTE — Discharge Instructions (Signed)
 Corey Park

## 2024-02-12 NOTE — Consult Note (Signed)
 [facility] Renal Consultation Note    Indication for Consultation:  Management of ESRD/hemodialysis; anemia, hypertension/volume and secondary hyperparathyroidism   HPI: Corey Park is a 53 y.o. male with ESRD on HD MWF, HTN, DM, CAD s/p CABG, CHF, blindness. He was admitted observation status for evaluation of chest pain and ongoing eye pain. He missed dialysis on Friday. K 5.9. CXR showed vascular congestion suggesting pulmonary edema. He was also evaluated by cardiology who did not suspect ACS. Echo has been ordered. No acute findings on head CT. Ophthalmology was consulted for eye pain, receiving gtt for glaucoma and will need further surgery.   Prior to admission last dialysis on 8/13. Had not missed a treatment since May, but not always completing full treatment time. Usually leaving over dry weight. He was dialyzed emergently from the ED on Monday. Had 2.6L removed.  Seen in room. Endorses ongoing eye pain. Denies chest pain, sob.   Past Medical History:  Diagnosis Date   Acute blood loss anemia 04/17/2022   Acute encephalopathy 08/11/2023   Acute hypoxic respiratory failure (HCC) 04/02/2023   Adult general medical exam 07/11/2022   Allergy, unspecified, initial encounter 08/25/2019   Asthma    as a child   Cellulitis, perineum 11/26/2019   CHF (congestive heart failure) (HCC) 2021   Complication of vascular dialysis catheter 08/25/2019   COVID-19 virus infection 07/27/2019   Diabetes mellitus without complication (HCC)    type 2   Dilated cardiomyopathy (HCC) 07/03/2018   Elevated troponin    ESRD on hemodialysis (HCC) 06/17/2018   MWF at [facility]   Fe deficiency anemia 12/23/2018   Hypertension    Legally blind    B/L   NSTEMI (non-ST elevated myocardial infarction) (HCC) 06/25/2023   Pneumonia 2021   Sepsis (HCC) 04/17/2022   Type 2 diabetes mellitus with diabetic peripheral angiopathy without gangrene (HCC) 08/25/2019   Unspecified protein-calorie  malnutrition (HCC) 08/25/2019   Past Surgical History:  Procedure Laterality Date   BASCILIC VEIN TRANSPOSITION Right 10/16/2019   Procedure: Basilic Vein Transposition Right Arm;  Surgeon: Serene Gaile ORN, MD;  Location: [facility] OR;  Service: Vascular;  Laterality: Right;   BASCILIC VEIN TRANSPOSITION Right 01/29/2020   Procedure: RIGHT ARM SECOND STAGE BASCILIC VEIN TRANSPOSITION;  Surgeon: Serene Gaile ORN, MD;  Location: [facility] OR;  Service: Vascular;  Laterality: Right;   BIOPSY  12/22/2018   Procedure: BIOPSY;  Surgeon: Celestia Agent, MD;  Location: [facility] ENDOSCOPY;  Service: Endoscopy;;   CORONARY ARTERY BYPASS GRAFT N/A 06/28/2023   Procedure: CORONARY ARTERY BYPASS GRAFTING TIMES THREE USING LEFT INTERNAL MAMMARY ARTERY AND RIGHT GREAT SAPHENOUS VEIN HARVESTED ENDOSCOPICALLY;  Surgeon: Shyrl Linnie KIDD, MD;  Location: [facility] OR;  Service: Open Heart Surgery;  Laterality: N/A;   CORONARY ULTRASOUND/IVUS N/A 12/13/2022   Procedure: Coronary Ultrasound/IVUS;  Surgeon: Dann Candyce RAMAN, MD;  Location: [facility] INVASIVE CV LAB;  Service: Cardiovascular;  Laterality: N/A;   ESOPHAGOGASTRODUODENOSCOPY N/A 12/22/2018   Procedure: ESOPHAGOGASTRODUODENOSCOPY (EGD);  Surgeon: Celestia Agent, MD;  Location: [facility] ENDOSCOPY;  Service: Endoscopy;  Laterality: N/A;   IR FLUORO GUIDE CV LINE RIGHT  07/29/2019   IR US  GUIDE VASC ACCESS RIGHT  07/29/2019   LEFT HEART CATH AND CORONARY ANGIOGRAPHY N/A 12/13/2022   Procedure: LEFT HEART CATH AND CORONARY ANGIOGRAPHY;  Surgeon: Dann Candyce RAMAN, MD;  Location: [facility] INVASIVE CV LAB;  Service: Cardiovascular;  Laterality: N/A;   LEFT HEART CATH AND CORONARY ANGIOGRAPHY N/A 06/25/2023   Procedure: LEFT HEART CATH AND CORONARY  ANGIOGRAPHY;  Surgeon: Verlin Lonni BIRCH, MD;  Location: [facility] INVASIVE CV LAB;  Service: Cardiovascular;  Laterality: N/A;   TEE WITHOUT CARDIOVERSION N/A 06/28/2023   Procedure: TRANSESOPHAGEAL ECHOCARDIOGRAM (TEE);  Surgeon: Shyrl Linnie KIDD, MD;  Location: [facility] OR;  Service: Open Heart Surgery;  Laterality: N/A;   TRANSESOPHAGEAL ECHOCARDIOGRAM (CATH LAB) N/A 05/10/2023   Procedure: TRANSESOPHAGEAL ECHOCARDIOGRAM;  Surgeon: Barbaraann Darryle Ned, MD;  Location: [facility] INVASIVE CV LAB;  Service: Cardiovascular;  Laterality: N/A;   Family History  Problem Relation Age of Onset   CAD Mother    Hypertension Mother    Diabetes Neg Hx    Stroke Neg Hx    Cancer Neg Hx    Kidney failure Neg Hx    Stomach cancer Neg Hx    Colon cancer Neg Hx    Rectal cancer Neg Hx    Social History:  reports that he has never smoked. He has never used smokeless tobacco. He reports that he does not currently use alcohol. He reports that he does not currently use drugs after having used the following drugs: Marijuana. Allergies  Allergen Reactions   Gabapentin      Hallucinations    Oxycodone      Hallucinations    Imdur  [Isosorbide  Nitrate] Other (See Comments)    Endorsed headache in the past - willing to retry   Prior to Admission medications   Medication Sig Start Date End Date Taking? Authorizing Provider  aspirin  EC 81 MG tablet Take 1 tablet (81 mg total) by mouth daily. Swallow whole. 07/08/23  Yes Gold, Wayne E, PA-C  atorvastatin  (LIPITOR ) 80 MG tablet Take 1 tablet (80 mg total) by mouth daily. 08/13/23  Yes Samtani, Jai-Gurmukh, MD  calcium  acetate (PHOSLO ) 667 MG capsule Take 3 capsules (2,001 mg total) by mouth with breakfast, with lunch, and with evening meal. 07/08/23  Yes Gold, Wayne E, PA-C  carvedilol  (COREG ) 25 MG tablet Take 25 mg by mouth every morning.   Yes [provider]  Cholecalciferol (VITAMIN D3) 50 MCG (2000 UT) capsule Take 2,000 Units by mouth daily. 01/02/24  Yes [provider]  clopidogrel  (PLAVIX ) 75 MG tablet Take 1 tablet (75 mg total) by mouth daily. 07/08/23  Yes Gold, Lemond BRAVO, PA-C  famotidine  (PEPCID ) 20 MG tablet Take 20 mg by mouth daily. 04/20/23  Yes [provider]  ferric citrate   (AURYXIA ) 1 GM 210 MG(Fe) tablet Take 2 tablets (420 mg total) by mouth daily. Patient taking differently: Take 420 mg by mouth 3 (three) times daily with meals. 12/14/22  Yes Tobie Gaines, DO  multivitamin (RENA-VIT) TABS tablet Take 1 tablet by mouth daily. 11/22/22  Yes [provider]  pantoprazole  (PROTONIX ) 20 MG tablet Take 20 mg by mouth daily as needed for heartburn. 04/19/23  Yes [provider]  Vitamin D , Ergocalciferol , (DRISDOL ) 1.25 MG (50000 UNIT) CAPS capsule Take 1 capsule (50,000 Units total) by mouth every 7 (seven) days. Patient taking differently: Take 50,000 Units by mouth every Monday. 08/19/23  Yes Samtani, Jai-Gurmukh, MD  olmesartan  (BENICAR ) 40 MG tablet Take 0.5 tablets (20 mg total) by mouth daily. Patient not taking: Reported on 11/20/2023 09/01/23   Kingsley, Victoria K, DO   Current Facility-Administered Medications  Medication Dose Route Frequency Provider Last Rate Last Admin   acetaminophen  (TYLENOL ) tablet 650 mg  650 mg Oral Q4H PRN Melvin, Alexander B, MD       atorvastatin  (LIPITOR ) tablet 80 mg  80 mg Oral Daily Melvin, Alexander B,  MD   80 mg at 02/12/24 0900   brimonidine  (ALPHAGAN ) 0.15 % ophthalmic solution 1 drop  1 drop Left Eye TID Melvin, Alexander B, MD   1 drop at 02/12/24 9095   calcium  acetate (PHOSLO ) capsule 2,001 mg  2,001 mg Oral TID with meals Seena Marsa NOVAK, MD   2,001 mg at 02/11/24 2125   carvedilol  (COREG ) tablet 25 mg  25 mg Oral BID WC Melvenia Motto, MD   25 mg at 02/12/24 0900   Chlorhexidine  Gluconate Cloth 2 % PADS 6 each  6 each Topical Q0600 Melvin, Alexander B, MD   6 each at 02/12/24 0541   clopidogrel  (PLAVIX ) tablet 75 mg  75 mg Oral Daily Melvin, Alexander B, MD   75 mg at 02/12/24 0900   dorzolamide  (TRUSOPT ) 2 % ophthalmic solution 1 drop  1 drop Left Eye TID Melvin, Alexander B, MD   1 drop at 02/12/24 9094   erythromycin  ophthalmic ointment   Left Eye TID McCuen, Christine, MD   1 Application at 02/12/24  9095   famotidine  (PEPCID ) tablet 20 mg  20 mg Oral Daily Melvin, Alexander B, MD   20 mg at 02/12/24 0859   feeding supplement (ENSURE PLUS HIGH PROTEIN) liquid 237 mL  237 mL Oral BID BM Melvin, Alexander B, MD   237 mL at 02/12/24 0902   heparin  injection 5,000 Units  5,000 Units Subcutaneous Q8H Seena Marsa NOVAK, MD   5,000 Units at 02/12/24 9461   HYDROcodone -acetaminophen  (NORCO/VICODIN) 5-325 MG per tablet 1 tablet  1 tablet Oral Q6H PRN Melvin, Alexander B, MD   1 tablet at 02/11/24 2313   HYDROmorphone  (DILAUDID ) injection 0.5 mg  0.5 mg Intravenous Q4H PRN Seena Marsa NOVAK, MD   0.5 mg at 02/12/24 0038   insulin  aspart (novoLOG ) injection 0-6 Units  0-6 Units Subcutaneous TID WC Melvin, Alexander B, MD       irbesartan  (AVAPRO ) tablet 75 mg  75 mg Oral Daily Melvin, Alexander B, MD   75 mg at 02/12/24 0900   latanoprost  (XALATAN ) 0.005 % ophthalmic solution 1 drop  1 drop Left Eye Daily Seena Marsa NOVAK, MD   1 drop at 02/12/24 9093   multivitamin (RENA-VIT) tablet 1 tablet  1 tablet Oral Daily Melvin, Alexander B, MD   1 tablet at 02/12/24 0900   ondansetron  (ZOFRAN ) injection 4 mg  4 mg Intravenous Q6H PRN Melvin, Alexander B, MD       pantoprazole  (PROTONIX ) EC tablet 20 mg  20 mg Oral Daily Melvin, Alexander B, MD   20 mg at 02/12/24 0900   timolol  (TIMOPTIC ) 0.5 % ophthalmic solution 1 drop  1 drop Left Eye BID Seena Marsa NOVAK, MD   1 drop at 02/12/24 0906     ROS: As per HPI otherwise negative.  Physical Exam: Vitals:   02/11/24 2315 02/12/24 0539 02/12/24 0707 02/12/24 1109  BP: 109/66 114/84 (!) 140/84 (!) 159/83  Pulse: 70 71 77 75  Resp: 11 14 18 19   Temp: 97.6 F (36.4 C) 97.7 F (36.5 C) 97.7 F (36.5 C) 98.1 F (36.7 C)  TempSrc: Oral Oral Oral Oral  SpO2: 100% 96% 97% 98%  Weight:      Height:         General: Lying in bed, nad  Head: NCAT, blind, eyes closed  Neck: No JVD appreciated  Lungs: Clear bilaterally  Heart: RRR, no murmur, rub   Abdomen: soft non-tender,no masses  Lower extremities:without edema or ischemic  changes, no open wounds  Neuro: A & O X 3. Moves all extremities spontaneously. Psych:  Responds to questions appropriately with a normal affect. Dialysis Access: RUE AVF +bruit   Labs: Basic Metabolic Panel: Recent Labs  Lab 02/11/24 0619 02/12/24 0222  NA 139 135  K 5.9* 5.0  CL 100 95*  CO2 20* 25  GLUCOSE 126* 132*  BUN 75* 51*  CREATININE 17.00* 13.59*  CALCIUM  9.1 9.1   Liver Function Tests: Recent Labs  Lab 02/11/24 0620 02/12/24 0222  AST 20 19  ALT 16 15  ALKPHOS 72 62  BILITOT 1.0 1.1  PROT 8.1 7.6  ALBUMIN  3.5 3.2*   Recent Labs  Lab 02/11/24 0620  LIPASE 81*   No results for input(s): AMMONIA in the last 168 hours. CBC: Recent Labs  Lab 02/11/24 0619 02/12/24 0222  WBC 7.7 6.0  HGB 10.7* 10.9*  HCT 31.5* 31.8*  MCV 98.4 96.7  PLT 170 174   Cardiac Enzymes: No results for input(s): CKTOTAL, CKMB, CKMBINDEX, TROPONINI in the last 168 hours. CBG: Recent Labs  Lab 02/11/24 0919 02/11/24 2122 02/12/24 0610 02/12/24 1107  GLUCAP 103* 124* 94 119*   Iron  Studies: No results for input(s): IRON , TIBC, TRANSFERRIN, FERRITIN in the last 72 hours. Studies/Results: CT Head Wo Contrast Result Date: 02/11/2024 CLINICAL DATA:  53 year old male with chest pain, shortness of breath, acute headache. Hypertensive, 182/90. EXAM: CT HEAD WITHOUT CONTRAST TECHNIQUE: Contiguous axial images were obtained from the base of the skull through the vertex without intravenous contrast. RADIATION DOSE REDUCTION: This exam was performed according to the departmental dose-optimization program which includes automated exposure control, adjustment of the mA and/or kV according to patient size and/or use of iterative reconstruction technique. COMPARISON:  Brain MRI 08/12/2023.  Head CT 09/01/2023. FINDINGS: Brain: Cerebral volume remains normal. No midline shift,  ventriculomegaly, mass effect, evidence of mass lesion, intracranial hemorrhage or evidence of cortically based acute infarction. Stable minimal to mild periventricular white matter hypodensity by CT. Maintained gray-white differentiation. Vascular: Severe Calcified atherosclerosis at the skull base. No suspicious intracranial vascular hyperdensity. Skull: Intact skull. Partially visible maxillary alveolus dental disease lucency. No acute osseous abnormality identified. Sinuses/Orbits: Stable mild paranasal sinus mucosal thickening. Tympanic cavities and mastoids remain well aerated. Other: Calcified scalp vessel atherosclerosis. No acute orbit or scalp soft tissue finding. IMPRESSION: 1. No acute intracranial abnormality. Stable mild for age cerebral white matter changes. 2. Severe calcified atherosclerosis, including scalp vessels, such as due to end-stage renal disease. 3. Partially visible maxillary dental disease. Electronically Signed   By: VEAR Hurst M.D.   On: 02/11/2024 07:17   DG Chest 2 View Result Date: 02/11/2024 CLINICAL DATA:  Chest pain shortness of breath. EXAM: CHEST - 2 VIEW COMPARISON:  11/20/2023 FINDINGS: The cardio pericardial silhouette is enlarged. Vascular congestion with subtle interstitial opacity suggesting edema. No focal consolidation. Small left pleural effusion suspected. Telemetry leads overlie the chest. IMPRESSION: Enlargement of the cardiopericardial silhouette with vascular congestion and subtle interstitial opacity suggesting edema. Electronically Signed   By: Camellia Candle M.D.   On: 02/11/2024 06:44    Dialysis Orders:  [facility] MWF 4:00 BFR 450 EDW 102 kg 2/2 AVF No heparin   -No ESA orders  -Calcitriol  1.75 q HD   Assessment/Plan: Chest pain, atypical. Cardiology consulted, not felt to be ACS. Troponin flat. Echocardiogram ordered. CP improved today.  Eye pain/blindness. Ophthalmology consulted. Receiving gtt for glaucoma. Will need cornea surgery.  ESRD.  HD MWF.  Missed treatment prior  to admit. Had HD her Monday. Continue on schedule. Next HD 8/20.   Access. AVF. No issues.  Hypertension. BP elevated. Continue home meds.  Volume. Push UF with HD.  Anemia. Hgb at goal. No ESA needs.  Metabolic bone disease.  Calcium  acetate binder. Follow trends here.  CAD s/p CAGB HFrEF DMT2   Maisie Ronnald Acosta PA-C [facility] 02/12/2024, 11:20 AM

## 2024-02-12 NOTE — Progress Notes (Signed)
 PT Cancellation Note  Patient Details Name: Corey Park MRN: [id number] DOB: 25-Sep-1970   Cancelled Treatment:    Reason Eval/Treat Not Completed: Patient declined, no reason specified. PT refuses PT evaluation twice, reports feeling unwell and tired and declines mobilization. PT will attempt to follow up as time allows.   Bernardino JINNY Ruth 02/12/2024, 4:06 PM

## 2024-02-12 NOTE — Hospital Course (Addendum)
 53 y.o. male with medical history significant of hypertension, hyperlipidemia, diabetes, GERD, CAD status post CABG earlier this year, anemia, chronic combined systolic and diastolic CHF, bilateral blindness, ESRD on HD, obesity presentined with chest pain and shortness of breath while getting ready for work 8/18.EMS was called and patient noted to be hypertensive in the 180s systolic.  Patient received aspirin  en route to the ED. He also reported headache left eye pain.  In the ED await further workup seen by nephrology ophthalmology, cardiology In the ED hemodynamically stable labs with hyperkalemia elevated troponin but flat Nephrology consulted and patient went for dialysis which was successful.Had 2 L removed but had continued chest pain after. CXR>cardiomegaly with changes consistent with pulmonary edema. CT head>no acute abnormality, showed severe atherosclerosis. Patient received fentanyl , Dilaudid , Coreg , irbesartan , insulin , glucose, Lokelma , albuterol , Reglan  in the Ed was srted on eyedrops. Echo obtained showing EF 45-50%, G2DD, RWMA,no pericardial effusion. Seen by ophthalmology, nephrology and cardiology. Continue pain management and outpatient follow-up with ophthalmology at this time.  Subjective: Seen and examined  No new complaints, overnight afebrile BP stable Pain controlled and ready to go home, today  Discharge diagnosis:  Atypical chest pain, pleuritic CAD recent CABG Elevated troponin-flat: Cardiology input appreciated troponin has been flat and downtrending, no evidence of ACS, follow-up echo-howing EF 45-50%, G2DD, RWMA,no pericardial effusion. Continue pain management, blood pressure control AND his home  Plavix  and statin.  Off aspirin  per cardio.  Blindness of both eyes Headache and eye pain: Continue eyedrops to lower pressure for patient's glaucoma- ophthalmo following-w/ ongoing pain stopped erythromycin  ointment started Atropine  1% OS daily and Pednisolone OS  qid.  Hopefully patient can be discharged with plan for outpatient  evisceration w/ oculoplastics-ophthalmology has informed that they the could see him on Thursday in the office for evaluation at 3:15 PM which will be added to his discharge instruction.  Will ask cardiology to evaluate him for general esthesia preop eval per ophthalmo-per Dr Court EF 45 to 50% by echo and he is 13-month status post CABG and suspect he is okay to undergo GA for eyenucleation He has appointment Thursday at 3.15 for ophthalmo office  Hypertension: Currently well-controlled Coreg  25, Avapro .  EESRD on HD MWF: Nephrology following for HD- last one 8/18- due on 8/20.  DM2: Well-controlled. Continue SSI Recent Labs  Lab 02/13/24 0609 02/13/24 1342 02/13/24 1633 02/13/24 2137 02/14/24 0555  GLUCAP 157* 97 149* 119* 101*    Chronic systolic heart failure Grade 2 diastolic dysfunction, chronic: address fluid with dialysis. The left ventricular  function has improved and wall motion better on new echo   GERD: Continue Pepcid   Anemia: Stable.   Class I Obesity w/ Body mass index is 30.02 kg/m.: Will benefit with PCP follow-up, weight loss,healthy lifestyle and outpatient sleep eval if not done.  Mobility: Independent Mobility: PT Orders: Active PT Follow up Rec:     DVT prophylaxis: heparin  injection 5,000 Units Start: 02/11/24 1715 Code Status:   Code Status: Full Code Family Communication: plan of care discussed with patient/wife at bedside. Patient status is: Remains hospitalized because of severity of illness with ongoing pain and needing iv dilaudid  prn. Level of care: Telemetry Cardiac   Dispo: The patient is from: home having transportation issues.            Anticipated disposition: home   Objective: Vitals last 24 hrs: Vitals:   02/13/24 1930 02/13/24 2345 02/14/24 0317 02/14/24 0710  BP: 118/78 91/60 101/64 108/68  Pulse: 73  73 82 70  Resp: 17 14 16 13   Temp: 98.1 F (36.7 C)  98.2 F (36.8 C) 98 F (36.7 C) 98 F (36.7 C)  TempSrc: Oral Oral Oral Oral  SpO2: 99% 97% 94% 92%  Weight:      Height:        Physical Examination: General exam: AAOX3 HEENT:Oral mucosa moist, Ear/Nose WNL grossly Respiratory system: Bilaterally clear BS,no use of accessory muscle Cardiovascular system: S1 & S2 +, No JVD. Gastrointestinal system: Abdomen soft,NT,ND, BS+ Nervous System: AAOX3, non focal, blind Extremities: LE edema neg, distal extremities warm.  Skin: No rashes,no icterus. MSK: Normal muscle bulk,tone, power   Medications reviewed:  Scheduled Meds:  atorvastatin   80 mg Oral Daily   atropine   1 drop Left Eye Daily   brimonidine   1 drop Left Eye TID   calcium  acetate  2,001 mg Oral TID with meals   carvedilol   25 mg Oral BID WC   Chlorhexidine  Gluconate Cloth  6 each Topical Q0600   clopidogrel   75 mg Oral Daily   dorzolamide   1 drop Left Eye TID   famotidine   20 mg Oral Daily   feeding supplement  237 mL Oral BID BM   heparin   5,000 Units Subcutaneous Q8H   insulin  aspart  0-6 Units Subcutaneous TID WC   irbesartan   75 mg Oral Daily   latanoprost   1 drop Left Eye Daily   multivitamin  1 tablet Oral Daily   pantoprazole   20 mg Oral Daily   polyethylene glycol  17 g Oral Daily   prednisoLONE  acetate  1 drop Left Eye Q4H   timolol   1 drop Left Eye BID   Continuous Infusions:   Diet: Diet Order             Diet - low sodium heart healthy           Diet Heart Room service appropriate? Yes; Fluid consistency: Thin; Fluid restriction: 2000 mL Fluid  Diet effective now

## 2024-02-13 ENCOUNTER — Other Ambulatory Visit (HOSPITAL_COMMUNITY): Payer: Self-pay

## 2024-02-13 DIAGNOSIS — I2510 Atherosclerotic heart disease of native coronary artery without angina pectoris: Secondary | ICD-10-CM | POA: Diagnosis present

## 2024-02-13 DIAGNOSIS — E8770 Fluid overload, unspecified: Secondary | ICD-10-CM | POA: Diagnosis present

## 2024-02-13 DIAGNOSIS — E11319 Type 2 diabetes mellitus with unspecified diabetic retinopathy without macular edema: Secondary | ICD-10-CM | POA: Diagnosis present

## 2024-02-13 DIAGNOSIS — I132 Hypertensive heart and chronic kidney disease with heart failure and with stage 5 chronic kidney disease, or end stage renal disease: Secondary | ICD-10-CM | POA: Diagnosis present

## 2024-02-13 DIAGNOSIS — H548 Legal blindness, as defined in USA: Secondary | ICD-10-CM | POA: Diagnosis present

## 2024-02-13 DIAGNOSIS — Z5982 Transportation insecurity: Secondary | ICD-10-CM | POA: Diagnosis not present

## 2024-02-13 DIAGNOSIS — D631 Anemia in chronic kidney disease: Secondary | ICD-10-CM | POA: Diagnosis present

## 2024-02-13 DIAGNOSIS — K219 Gastro-esophageal reflux disease without esophagitis: Secondary | ICD-10-CM | POA: Diagnosis present

## 2024-02-13 DIAGNOSIS — Z8616 Personal history of COVID-19: Secondary | ICD-10-CM | POA: Diagnosis not present

## 2024-02-13 DIAGNOSIS — Z8249 Family history of ischemic heart disease and other diseases of the circulatory system: Secondary | ICD-10-CM | POA: Diagnosis not present

## 2024-02-13 DIAGNOSIS — E1122 Type 2 diabetes mellitus with diabetic chronic kidney disease: Secondary | ICD-10-CM | POA: Diagnosis present

## 2024-02-13 DIAGNOSIS — E1151 Type 2 diabetes mellitus with diabetic peripheral angiopathy without gangrene: Secondary | ICD-10-CM | POA: Diagnosis present

## 2024-02-13 DIAGNOSIS — Z6830 Body mass index (BMI) 30.0-30.9, adult: Secondary | ICD-10-CM | POA: Diagnosis not present

## 2024-02-13 DIAGNOSIS — J45909 Unspecified asthma, uncomplicated: Secondary | ICD-10-CM | POA: Diagnosis present

## 2024-02-13 DIAGNOSIS — I420 Dilated cardiomyopathy: Secondary | ICD-10-CM | POA: Diagnosis present

## 2024-02-13 DIAGNOSIS — Z79899 Other long term (current) drug therapy: Secondary | ICD-10-CM | POA: Diagnosis not present

## 2024-02-13 DIAGNOSIS — N2581 Secondary hyperparathyroidism of renal origin: Secondary | ICD-10-CM | POA: Diagnosis present

## 2024-02-13 DIAGNOSIS — H4052X0 Glaucoma secondary to other eye disorders, left eye, stage unspecified: Secondary | ICD-10-CM | POA: Diagnosis present

## 2024-02-13 DIAGNOSIS — E66811 Obesity, class 1: Secondary | ICD-10-CM | POA: Diagnosis present

## 2024-02-13 DIAGNOSIS — Z992 Dependence on renal dialysis: Secondary | ICD-10-CM | POA: Diagnosis not present

## 2024-02-13 DIAGNOSIS — N186 End stage renal disease: Secondary | ICD-10-CM | POA: Diagnosis present

## 2024-02-13 DIAGNOSIS — E875 Hyperkalemia: Secondary | ICD-10-CM | POA: Diagnosis present

## 2024-02-13 DIAGNOSIS — I5042 Chronic combined systolic (congestive) and diastolic (congestive) heart failure: Secondary | ICD-10-CM | POA: Diagnosis present

## 2024-02-13 DIAGNOSIS — E785 Hyperlipidemia, unspecified: Secondary | ICD-10-CM | POA: Diagnosis present

## 2024-02-13 DIAGNOSIS — R079 Chest pain, unspecified: Secondary | ICD-10-CM | POA: Diagnosis not present

## 2024-02-13 LAB — GLUCOSE, CAPILLARY
Glucose-Capillary: 157 mg/dL — ABNORMAL HIGH (ref 70–99)
Glucose-Capillary: 97 mg/dL (ref 70–99)
Glucose-Capillary: 149 mg/dL — ABNORMAL HIGH (ref 70–99)
Glucose-Capillary: 119 mg/dL — ABNORMAL HIGH (ref 70–99)

## 2024-02-13 MED ORDER — TIMOLOL MALEATE 0.5 % OP SOLN
1.0000 [drp] | Freq: Two times a day (BID) | OPHTHALMIC | 12 refills | Status: DC
  Filled 2024-02-13: qty 10, 90d supply, fill #0

## 2024-02-13 MED ORDER — DORZOLAMIDE HCL-TIMOLOL MAL 2-0.5 % OP SOLN
1.0000 [drp] | Freq: Two times a day (BID) | OPHTHALMIC | 12 refills | Status: AC
  Filled 2024-02-13: qty 10, 100d supply, fill #0

## 2024-02-13 MED ORDER — HYDROCODONE-ACETAMINOPHEN 5-325 MG PO TABS
1.0000 | ORAL_TABLET | Freq: Four times a day (QID) | ORAL | 0 refills | Status: DC | PRN
  Filled 2024-02-13: qty 10, 3d supply, fill #0

## 2024-02-13 MED ORDER — PENTAFLUOROPROP-TETRAFLUOROETH EX AERO
1.0000 | INHALATION_SPRAY | CUTANEOUS | Status: DC | PRN
Start: 2024-02-13 — End: 2024-02-13

## 2024-02-13 MED ORDER — ATROPINE SULFATE 1 % OP SOLN
1.0000 [drp] | Freq: Every day | OPHTHALMIC | 12 refills | Status: AC
  Filled 2024-02-13: qty 2, 40d supply, fill #0

## 2024-02-13 MED ORDER — LATANOPROST 0.005 % OP SOLN
1.0000 [drp] | Freq: Every day | OPHTHALMIC | 12 refills | Status: AC
  Filled 2024-02-13: qty 2.5, 50d supply, fill #0

## 2024-02-13 MED ORDER — TRAMADOL HCL 50 MG PO TABS
50.0000 mg | ORAL_TABLET | Freq: Two times a day (BID) | ORAL | Status: DC | PRN
  Administered 2024-02-13: 50 mg via ORAL
  Filled 2024-02-13: qty 1

## 2024-02-13 MED ORDER — PREDNISOLONE ACETATE 1 % OP SUSP
1.0000 [drp] | OPHTHALMIC | 0 refills | Status: DC
  Filled 2024-02-13: qty 5, 17d supply, fill #0

## 2024-02-13 MED ORDER — ALTEPLASE 2 MG IJ SOLR: 2.0000 mg | Freq: Once | INTRAMUSCULAR | Status: DC | PRN

## 2024-02-13 MED ORDER — LIDOCAINE-PRILOCAINE 2.5-2.5 % EX CREA
1.0000 | TOPICAL_CREAM | CUTANEOUS | Status: DC | PRN
Start: 2024-02-13 — End: 2024-02-13

## 2024-02-13 MED ORDER — HEPARIN SODIUM (PORCINE) 1000 UNIT/ML DIALYSIS: 1000.0000 [IU] | INTRAMUSCULAR | Status: DC | PRN

## 2024-02-13 MED ORDER — IRBESARTAN 75 MG PO TABS
75.0000 mg | ORAL_TABLET | Freq: Every day | ORAL | 0 refills | Status: AC
  Filled 2024-02-13: qty 30, 30d supply, fill #0

## 2024-02-13 MED ORDER — TRAMADOL HCL 50 MG PO TABS
50.0000 mg | ORAL_TABLET | Freq: Two times a day (BID) | ORAL | 0 refills | Status: DC | PRN
  Filled 2024-02-13: qty 15, 8d supply, fill #0

## 2024-02-13 MED ORDER — ANTICOAGULANT SODIUM CITRATE 4% (200MG/5ML) IV SOLN
5.0000 mL | Status: DC | PRN
Start: 2024-02-13 — End: 2024-02-13

## 2024-02-13 MED ORDER — LIDOCAINE HCL (PF) 1 % IJ SOLN
5.0000 mL | INTRAMUSCULAR | Status: DC | PRN
Start: 2024-02-13 — End: 2024-02-13

## 2024-02-13 NOTE — Evaluation (Signed)
 Occupational Therapy Evaluation Patient Details Name: Corey Park MRN: [id number] DOB: 12/08/1970 Today's Date: 02/13/2024   History of Present Illness   53 y.o. male presents to [facility] hospital on 02/11/2024 with chest pain, L eye pain and SOB. Workup notable for volume overload. 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     Clinical Impressions Pt admitted based on above, and was seen based on problem list below. PTA pt was living alone and was largely ind with ADLs but receiving set up assist for bathing and dressing, prn use of RW. Today pt is requiring set up  to mod assist for ADLs. Eval limited d/t pain and fatigue from HD, pt declining all forms of OOB activities. At this time pt would benefit from [facility]. OT will continue to follow acutely to maximize functional independence.        If plan is discharge home, recommend the following:   A little help with walking and/or transfers;A little help with bathing/dressing/bathroom;Help with stairs or ramp for entrance     Functional Status Assessment   Patient has had a recent decline in their functional status and demonstrates the ability to make significant improvements in function in a reasonable and predictable amount of time.     Equipment Recommendations   BSC/3in1      Precautions/Restrictions   Precautions Precautions: Fall Recall of Precautions/Restrictions: Intact Restrictions Weight Bearing Restrictions Per Provider Order: No         ADL either performed or assessed with clinical judgement   ADL Overall ADL's : Needs assistance/impaired Eating/Feeding: Set up   Grooming: Set up   Upper Body Bathing: Set up;Sitting   Lower Body Bathing: Moderate assistance;Sit to/from stand   Upper Body Dressing : Set up   Lower Body Dressing: Moderate assistance;Sit to/from stand       General ADL Comments: Pt declining OOB activities d/t pain,scores largely  based on clinical judgement     Vision Baseline Vision/History: 2 Legally blind Patient Visual Report: Other (comment) (Pain in bil eyes)              Pertinent Vitals/Pain Pain Assessment Pain Assessment: Faces Faces Pain Scale: Hurts even more Pain Location: head & eyes Pain Descriptors / Indicators: Discomfort, Grimacing Pain Intervention(s): Limited activity within patient's tolerance     Extremity/Trunk Assessment Upper Extremity Assessment Upper Extremity Assessment: Generalized weakness   Lower Extremity Assessment Lower Extremity Assessment: Defer to PT evaluation   Cervical / Trunk Assessment Cervical / Trunk Assessment: Normal   Communication Communication Communication: No apparent difficulties   Cognition Arousal: Alert Behavior During Therapy: WFL for tasks assessed/performed Cognition: No apparent impairments     Following commands: Intact       Cueing  General Comments   Cueing Techniques: Verbal cues  Pt reporting low BP and lightheadedness and dizziness, but BP 145/89, RN notified           Home Living Family/patient expects to be discharged to:: Private residence Living Arrangements: Alone Available Help at Discharge: Family;Available PRN/intermittently Type of Home: Apartment Home Access: Stairs to enter Entrance Stairs-Number of Steps: 16 Entrance Stairs-Rails: Right;Left Home Layout: One level     Bathroom Shower/Tub: Sponge bathes at baseline   Bathroom Toilet: Standard Bathroom Accessibility: Yes   Home Equipment: Rolling Walker (2 wheels)          Prior Functioning/Environment Prior Level of Function : Independent/Modified Independent  Mobility Comments: use of RW prn since CABG ADLs Comments: Reports basin bathing at baseline, son picks up clothes as set up assist    OT Problem List: Decreased strength;Decreased range of motion;Decreased activity tolerance;Impaired balance (sitting and/or  standing);Decreased safety awareness;Decreased knowledge of use of DME or AE;Cardiopulmonary status limiting activity   OT Treatment/Interventions: Self-care/ADL training;Therapeutic exercise;DME and/or AE instruction;Energy conservation;Therapeutic activities;Patient/family education;Balance training      OT Goals(Current goals can be found in the care plan section)   Acute Rehab OT Goals Patient Stated Goal: To get a PCA OT Goal Formulation: With patient Time For Goal Achievement: 02/27/24 Potential to Achieve Goals: Good   OT Frequency:  Min 1X/week       AM-PAC OT 6 Clicks Daily Activity     Outcome Measure Help from another person eating meals?: A Little Help from another person taking care of personal grooming?: A Little Help from another person toileting, which includes using toliet, bedpan, or urinal?: A Lot Help from another person bathing (including washing, rinsing, drying)?: A Lot Help from another person to put on and taking off regular upper body clothing?: A Little Help from another person to put on and taking off regular lower body clothing?: A Lot 6 Click Score: 15   End of Session Nurse Communication: Mobility status  Activity Tolerance: Patient limited by pain Patient left: in bed;with call bell/phone within reach;with family/visitor present  OT Visit Diagnosis: Muscle weakness (generalized) (M62.81)                Time: 8664-8640 OT Time Calculation (min): 24 min Charges:  OT General Charges $OT Visit: 1 Visit OT Evaluation $OT Eval Moderate Complexity: 1 Mod  Vina Byrd C, OT  Acute Rehabilitation Services Office [phone] Secure chat preferred   Adrianne GORMAN Savers 02/13/2024, 2:49 PM

## 2024-02-13 NOTE — Progress Notes (Signed)
 Pt stated that he has not had a BM since 02/08/24, offered pt laxative/stool softener & pt refused.  Lonell LITTIE Lyme, RN

## 2024-02-13 NOTE — Progress Notes (Signed)
 PT Cancellation Note  Patient Details Name: Corey Park MRN: [id number] DOB: [birth year]   Cancelled Treatment:    Reason Eval/Treat Not Completed: Patient declined, no reason specified (Pt states that he is very fatigued from HD; he normally gets it in the afternoon. States that he will get up tomorrow. Will follow up as able and appropriate.)  Dorothyann Maier, DPT, CLT  Acute Rehabilitation Services Office: [phone] (Secure chat preferred)   Dorothyann VEAR Maier 02/13/2024, 2:07 PM

## 2024-02-13 NOTE — TOC Initial Note (Signed)
 Transition of Care [facility]) - Initial/Assessment Note    Patient Details  Name: Corey Park MRN: [id number] Date of Birth: Aug 27, 1970  Transition of [facility]) CM/SW Contact:    Stephane Powell Jansky, RN Phone Number: 02/13/2024, 3:45 PM  Clinical Narrative:                 Spoke to patient at bedside. Patient from home alone. Independent prior to admission.   Discussed home health OT,PT,RN ( medication assistance) and aide. Patient agreeable. Provided choice. Patient would like to use [facility]. Shylise with [facility] accepted referral. Secure chatted MD to sign orders.   OT recommending 3 in1, patient agreeable. Secure chatted MD toi sign note and order. Called Mitch with [facility] .   Patient has a walker at home   Expected Discharge Plan: Home w Home Health Services Barriers to Discharge: Continued Medical Work up   Patient Goals and CMS Choice Patient states their goals for this hospitalization and ongoing recovery are:: to return to home CMS Medicare.gov Compare Post Acute Care list provided to:: Patient Choice offered to / list presented to : Patient      Expected Discharge Plan and Services   Discharge Planning Services: CM Consult Post Acute Care Choice: Durable Medical Equipment, Home Health Living arrangements for the past 2 months: Apartment                 DME Arranged: 3-N-1 DME Agency: [facility] Date DME Agency Contacted: 02/13/24 Time DME Agency Contacted: [phone] Representative spoke with at DME Agency: Mitch HH Arranged: RN, PT, OT, Nurse's Aide HH Agency: [facility] ([facility]) Date HH Agency Contacted: 02/13/24 Time HH Agency Contacted: [phone] Representative spoke with at [id number] [facility] Agency: Donette  Prior Living Arrangements/Services Living arrangements for the past 2 months: Apartment Lives with:: Self Patient language and need for interpreter reviewed:: Yes Do you feel safe going back to the place where you live?: Yes          Current home  services: DME Criminal Activity/Legal Involvement Pertinent to Current Situation/Hospitalization: No - Comment as needed  Activities of Daily Living   ADL Screening (condition at time of admission) Independently performs ADLs?: No Does the patient have a NEW difficulty with bathing/dressing/toileting/self-feeding that is expected to last >3 days?: No Does the patient have a NEW difficulty with getting in/out of bed, walking, or climbing stairs that is expected to last >3 days?: No Does the patient have a NEW difficulty with communication that is expected to last >3 days?: No Is the patient deaf or have difficulty hearing?: No Does the patient have difficulty seeing, even when wearing glasses/contacts?: Yes Does the patient have difficulty concentrating, remembering, or making decisions?: No  Permission Sought/Granted   Permission granted to share information with : Yes, Verbal Permission Granted     Permission granted to share info w AGENCY: [facility]        Emotional Assessment Appearance:: Appears stated age Attitude/Demeanor/Rapport: Engaged Affect (typically observed): Appropriate Orientation: : Oriented to Self, Oriented to Place, Oriented to  Time, Oriented to Situation Alcohol / Substance Use: Not Applicable Psych Involvement: No (comment)  Admission diagnosis:  Hyperkalemia [E87.5] Chest pain, rule out acute myocardial infarction [R07.9] 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] Chest pain, unspecified type [R07.9] Hypertension, unspecified type [I10] Patient Active Problem List   Diagnosis Date Noted   Chest pain, rule out acute myocardial infarction 02/11/2024   Hallucinations 08/11/2023   Bacterial conjunctivitis 08/11/2023   Hyperglycemia 06/29/2023  Pre-diabetes 06/29/2023   Blindness of both eyes 06/29/2023   S/P CABG x 3 06/28/2023   Hx of CABG 06/28/2023   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   Obesity (BMI 30-39.9) 05/09/2023   Chest pain 05/09/2023    Anemia 12/12/2022   Syncope and collapse 10/19/2022   Myocardial injury 08/06/2022   Non-cardiac chest pain 08/05/2022   Need for assistance at home without other household member able to render care 08/05/2022   Essential hypertension 04/17/2022   Thrombocytopenia (HCC) 04/17/2022   History of GI bleed 04/17/2022   Atypical chest pain 04/17/2022   GERD (gastroesophageal reflux disease) 04/17/2022   Nausea and vomiting 04/17/2022   Chronic hiccups 04/17/2022   Pain due to onychomycosis of toenails of both feet 10/11/2021   Hypercalcemia 04/13/2020   Coagulation defect, unspecified (HCC) 08/25/2019   Secondary hyperparathyroidism of renal origin (HCC) 08/25/2019   History of anemia due to chronic kidney disease 12/23/2018   End stage renal disease on dialysis [facility])    Chronic systolic heart failure (HCC) 07/03/2018   HLD (hyperlipidemia) 06/14/2007   DM2 (diabetes mellitus, type 2) (HCC) 06/11/2007   PCP:  Lorren Greig PARAS, NP Pharmacy:   [id number] [place], [place] - [address] AT [facility] OF [address] & [address] [place] [address] Phone: [phone] Fax: [phone]  [facility] - [place], [place] - [address] [place] [address] Phone: [phone] Fax: [phone]     Social Drivers of Health (SDOH) Social History: SDOH Screenings   Food Insecurity: No Food Insecurity (02/11/2024)  Housing: Low Risk  (02/11/2024)  Transportation Needs: No Transportation Needs (02/11/2024)  Utilities: Not At Risk (02/11/2024)  Alcohol Screen: Low Risk  (11/01/2023)  Depression (PHQ2-9): Low Risk  (11/01/2023)  Financial Resource Strain: Low Risk  (11/01/2023)  Recent Concern: Financial Resource Strain - High Risk (08/23/2023)  Physical Activity: Insufficiently Active (11/01/2023)  Social Connections: Moderately Integrated (11/01/2023)  Stress: No Stress Concern Present (11/01/2023)   Tobacco Use: Low Risk  (02/11/2024)  Health Literacy: Inadequate Health Literacy (08/23/2023)   SDOH Interventions:     Readmission Risk Interventions    06/29/2023    1:27 PM 12/13/2022   10:39 AM  Readmission Risk Prevention Plan  Transportation Screening Complete Complete  PCP or Specialist Appt within 3-5 Days  Complete  HRI or Home Care Consult  Complete  Social Work Consult for Recovery Care Planning/Counseling  Complete  Palliative Care Screening  Complete  Medication Review Oceanographer) Referral to Pharmacy Referral to Pharmacy  HRI or Home Care Consult Complete   SW Recovery Care/Counseling Consult Complete   Palliative Care Screening Not Applicable

## 2024-02-13 NOTE — TOC CM/SW Note (Cosign Needed)
    Durable Medical Equipment  (From admission, onward)           Start     Ordered   02/13/24 1517  For home use only DME 3 n 1  Once        02/13/24 1516           Patient confined to a room with no bathroom, therefore needs 3 in 1 / bedside commode.

## 2024-02-13 NOTE — Progress Notes (Signed)
 Heart Failure Navigator Progress Note  Assessed for Heart & Vascular TOC clinic readiness.  Patient does not meet criteria due to ESRD on hemodialysis. No HF TOC.   Navigator will sign off at this time.   Randie Bustle, BSN, Scientist, clinical (histocompatibility and immunogenetics) Only

## 2024-02-13 NOTE — Progress Notes (Signed)
 Received patient in bed.Alert and oriented x 3. He is blind. Consent verified.  Access used: Right upper arm avf that worked well.  Duration of treatment: 3.5 hours.  Net uf: 2.3 liters.  Hemo comment: His sbp were dropping as he went along ,especially near the end of his treatment,thus net uf of 2.3 liters.  Hand off to the patient's nurse,back into his room with stable condition via transporter.

## 2024-02-13 NOTE — Progress Notes (Signed)
 OT Cancellation Note  Patient Details Name: Judith Campillo MRN: [id number] DOB: 10-01-1970   Cancelled Treatment:    Reason Eval/Treat Not Completed: Patient at procedure or test/ unavailable Pt off unit at HD. Will follow up as schedule allows.    [place] Stegemann C, OT  Acute Rehabilitation Services Office [phone] Secure chat preferred   Adrianne GORMAN Savers 02/13/2024, 8:19 AM

## 2024-02-13 NOTE — Progress Notes (Addendum)
 PROGRESS NOTE Corey Park  FMW:[id number] DOB: [birth year] DOA: 02/11/2024 PCP: Lorren Greig PARAS, NP  Brief Narrative/Hospital Course: 53 y.o. male with medical history significant of hypertension, hyperlipidemia, diabetes, GERD, CAD status post CABG earlier this year, anemia, chronic combined systolic and diastolic CHF, bilateral blindness, ESRD on HD, obesity presentined with chest pain and shortness of breath while getting ready for work 8/18.EMS was called and patient noted to be hypertensive in the 180s systolic.  Patient received aspirin  en route to the ED. He also reported headache left eye pain.  In the ED await further workup seen by nephrology ophthalmology, cardiology In the ED hemodynamically stable labs with hyperkalemia elevated troponin but flat Nephrology consulted and patient went for dialysis which was successful.Had 2 L removed but had continued chest pain after. CXR>cardiomegaly with changes consistent with pulmonary edema. CT head>no acute abnormality, showed severe atherosclerosis. Patient received fentanyl , Dilaudid , Coreg , irbesartan , insulin , glucose, Lokelma , albuterol , Reglan  in the Ed was srted on eyedrops. Echo obtained showing EF 45-50%, G2DD, RWMA,no pericardial effusion.  Subjective: Seen and examined in HD Overnight afebrile BP stable Some pain on eyes/headache  Assessment and plan:  Atypical chest pain, pleuritic CAD recent CABG Elevated troponin-flat: Cardiology input appreciated troponin has been flat and downtrending, no evidence of ACS, follow-up echo-howing EF 45-50%, G2DD, RWMA,no pericardial effusion. Continue pain management, blood pressure control AND his home  Plavix  and statin.  Off aspirin  per cardio.  Blindness of both eyes Headache and eye pain: Continue eyedrops to lower pressure for patient's glaucoma- ophthalmo following-w/ ongoing pain stopped erythromycin  ointment started Atropine  1% OS daily and Pednisolone OS qid.  Hopefully patient  can be discharged with plan for outpatient  evisceration w/ oculoplastics-ophthalmology has informed that they the could see him on Thursday in the office for evaluation at 3:15 PM which will be added to his discharge instruction.  Will ask cardiology to evaluate him for general esthesia preop eval per ophthalmo-per Dr Court EF 45 to 50% by echo and he is 65-month status post CABG and suspect he is okay to undergo GA for eyenucleation He has appointment Thursday at 3.15 for ophthalmo office  Hypertension: Currently well-controlled Coreg  25, Avapro .  EESRD on HD MWF: Nephrology following for HD- last one 8/18- due on 8/20.  DM2: Well-controlled. Continue SSI Recent Labs  Lab 02/12/24 1107 02/12/24 1558 02/12/24 2119 02/13/24 0609 02/13/24 1342  GLUCAP 119* 141* 134* 157* 97    Chronic systolic heart failure Grade 2 diastolic dysfunction, chronic: address fluid with dialysis. The left ventricular  function has improved and wall motion better on new echo   GERD: Continue Pepcid   Anemia: Stable.   Class I Obesity w/ Body mass index is 30.02 kg/m.: Will benefit with PCP follow-up, weight loss,healthy lifestyle and outpatient sleep eval if not done.  Mobility: Independent Mobility: PT Orders: Active PT Follow up Rec:     DVT prophylaxis: heparin  injection 5,000 Units Start: 02/11/24 1715 Code Status:   Code Status: Full Code Family Communication: plan of care discussed with patient/wife at bedside. Patient status is: Remains hospitalized because of severity of illness with ongoing pain and needing iv dilaudid  prn. Level of care: Telemetry Cardiac   Dispo: The patient is from: home having transportation issues.            Anticipated disposition: home  tomorrow Objective: Vitals last 24 hrs: Vitals:   02/13/24 1253 02/13/24 1255 02/13/24 1257 02/13/24 1343  BP:   120/69 (!) 145/89  Pulse:   72  71  Resp:   14 13  Temp: 98.2 F (36.8 C)   97.6 F (36.4 C)  TempSrc:  Oral   Oral  SpO2:   98% 97%  Weight:  103.2 kg 103.2 kg   Height:        Physical Examination: General exam: AAOX3 HEENT:Oral mucosa moist, Ear/Nose WNL grossly Respiratory system: Bilaterally clear BS,no use of accessory muscle Cardiovascular system: S1 & S2 +, No JVD. Gastrointestinal system: Abdomen soft,NT,ND, BS+ Nervous System: Alert, awake, moving all extremities,and following commands blind. Extremities: LE edema neg, distal extremities warm.  Skin: No rashes,no icterus. MSK: Normal muscle bulk,tone, power   Medications reviewed:  Scheduled Meds:  atorvastatin   80 mg Oral Daily   atropine   1 drop Left Eye Daily   brimonidine   1 drop Left Eye TID   calcium  acetate  2,001 mg Oral TID with meals   carvedilol   25 mg Oral BID WC   Chlorhexidine  Gluconate Cloth  6 each Topical Q0600   clopidogrel   75 mg Oral Daily   dorzolamide   1 drop Left Eye TID   famotidine   20 mg Oral Daily   feeding supplement  237 mL Oral BID BM   heparin   5,000 Units Subcutaneous Q8H   insulin  aspart  0-6 Units Subcutaneous TID WC   irbesartan   75 mg Oral Daily   latanoprost   1 drop Left Eye Daily   multivitamin  1 tablet Oral Daily   pantoprazole   20 mg Oral Daily   polyethylene glycol  17 g Oral Daily   prednisoLONE  acetate  1 drop Left Eye Q4H   timolol   1 drop Left Eye BID   Continuous Infusions:   Diet: Diet Order             Diet - low sodium heart healthy           Diet Heart Room service appropriate? Yes; Fluid consistency: Thin; Fluid restriction: 2000 mL Fluid  Diet effective now                    Data Reviewed: I have personally reviewed following labs and imaging studies ( see epic result tab) CBC: Recent Labs  Lab 02/11/24 0619 02/12/24 0222  WBC 7.7 6.0  HGB 10.7* 10.9*  HCT 31.5* 31.8*  MCV 98.4 96.7  PLT 170 174   CMP: Recent Labs  Lab 02/11/24 0619 02/12/24 0222  NA 139 135  K 5.9* 5.0  CL 100 95*  CO2 20* 25  GLUCOSE 126* 132*  BUN 75* 51*   CREATININE 17.00* 13.59*  CALCIUM  9.1 9.1   GFR: Estimated Creatinine Clearance: 7.9 mL/min (A) (by C-G formula based on SCr of 13.59 mg/dL (H)). Recent Labs  Lab 02/11/24 0620 02/12/24 0222  AST 20 19  ALT 16 15  ALKPHOS 72 62  BILITOT 1.0 1.1  PROT 8.1 7.6  ALBUMIN  3.5 3.2*    Recent Labs  Lab 02/11/24 0620  LIPASE 81*   No results for input(s): AMMONIA in the last 168 hours. Coagulation Profile: No results for input(s): INR, PROTIME in the last 168 hours. Unresulted Labs (From admission, onward)    None      Antimicrobials/Microbiology: Anti-infectives (From admission, onward)    None         Component Value Date/Time   SDES BLOOD LEFT ANTECUBITAL 05/09/2023 1822   SPECREQUEST  05/09/2023 1822    BOTTLES DRAWN AEROBIC ONLY Blood Culture adequate volume   CULT  05/09/2023  1822    NO GROWTH 5 DAYS Performed at [facility], [address] [place], [place] [address]    REPTSTATUS 05/14/2023 FINAL 05/09/2023 8177  Procedures:  Mennie LAMY, MD [facility] 02/13/2024, 2:26 PM

## 2024-02-14 ENCOUNTER — Other Ambulatory Visit (HOSPITAL_COMMUNITY): Payer: Self-pay

## 2024-02-14 DIAGNOSIS — R079 Chest pain, unspecified: Secondary | ICD-10-CM | POA: Diagnosis not present

## 2024-02-14 LAB — GLUCOSE, CAPILLARY: Glucose-Capillary: 101 mg/dL — ABNORMAL HIGH (ref 70–99)

## 2024-02-14 MED ORDER — CLOPIDOGREL BISULFATE 75 MG PO TABS: 75.0000 mg | ORAL_TABLET | Freq: Every day | ORAL | 0 refills | Status: AC

## 2024-02-14 NOTE — Discharge Planning (Signed)
 [facility] Patient Discharge Orders - [facility] CLINIC: [facility]  Patient's name: Corey Park Admit/DC Dates: 02/11/2024 - 02/14/2024  DISCHARGE DIAGNOSES: Atypical chest pain, pleuritic;  CAD recent CABG: Cardiology input appreciated troponin has been flat and downtrending, no evidence of ACS, follow-up echo-howing EF 45-50%, G2DD, RWMA,no pericardial effusion. Continue pain management, blood pressure control AND his home Plavix  and statin. Off aspirin  per cardiology ESRD on HD   HD ORDER CHANGES: Heparin  change: no heparin  EDW Change: no  Bath Change: no  ANEMIA MANAGEMENT: Aranesp : Given: no    ESA dose for discharge: Mircera per protocol IV Iron  dose at discharge: per protocol Transfusion: Given: none given  BONE/MINERAL MEDICATIONS: Hectorol/Calcitriol  change: continue 1.75 mcg q HD Sensipar /Parsabiv change: per protocol  ACCESS INTERVENTION/CHANGE: no change Details:   RECENT LABS: Recent Labs  Lab 02/12/24 0222  HGB 10.9*  NA 135  K 5.0  CALCIUM  9.1  ALBUMIN  3.2*    IV ANTIBIOTICS: no Details:   OTHER/APPTS/LAB ORDERS: Please draw updated labs at next HD.   D/C Meds to be reconciled by nurse after every discharge.  Completed By: Belvie Och, NP   Reviewed by: MD:______ RN_______

## 2024-02-14 NOTE — TOC Progression Note (Addendum)
 Transition of Care [facility]) - Progression Note    Patient Details  Name: Corey Park MRN: [id number] Date of Birth: January 13, 1971  Transition of Care [facility] CM/SW Contact  Stephane Powell Jansky, RN Phone Number: 02/14/2024, 9:21 AM  Clinical Narrative:      BSC is at bedside. Patient states he has someone coming to pick him up who will bring his portable oxygen   Message sent to [facility] regarding discharge today  Expected Discharge Plan: Home w Home Health Services Barriers to Discharge: Continued Medical Work up               Expected Discharge Plan and Services   Discharge Planning Services: CM Consult Post Acute Care Choice: Durable Medical Equipment, Home Health Living arrangements for the past 2 months: Apartment Expected Discharge Date: 02/14/24               DME Arranged: 3-N-1 DME Agency: [employer] Date DME Agency Contacted: 02/13/24 Time DME Agency Contacted: [phone] Representative spoke with at DME Agency: [facility] HH Arranged: RN, PT, OT, Nurse's Aide HH Agency: [facility] ([facility]) Date HH Agency Contacted: 02/13/24 Time HH Agency Contacted: [phone] Representative spoke with at [facility] Agency: Engineer, materials   Social Drivers of Health (SDOH) Interventions SDOH Screenings   Food Insecurity: No Food Insecurity (02/11/2024)  Housing: Low Risk  (02/11/2024)  Transportation Needs: No Transportation Needs (02/11/2024)  Utilities: Not At Risk (02/11/2024)  Alcohol Screen: Low Risk  (11/01/2023)  Depression (PHQ2-9): Low Risk  (11/01/2023)  Financial Resource Strain: Low Risk  (11/01/2023)  Recent Concern: Financial Resource Strain - High Risk (08/23/2023)  Physical Activity: Insufficiently Active (11/01/2023)  Social Connections: Moderately Integrated (11/01/2023)  Stress: No Stress Concern Present (11/01/2023)  Tobacco Use: Low Risk  (02/11/2024)  Health Literacy: Inadequate Health Literacy (08/23/2023)    Readmission Risk Interventions    06/29/2023    1:27 PM 12/13/2022    10:39 AM  Readmission Risk Prevention Plan  Transportation Screening Complete Complete  PCP or Specialist Appt within 3-5 Days  Complete  HRI or Home Care Consult  Complete  Social Work Consult for Recovery Care Planning/Counseling  Complete  Palliative Care Screening  Complete  Medication Review Oceanographer) Referral to Pharmacy Referral to Pharmacy  HRI or Home Care Consult Complete   SW Recovery Care/Counseling Consult Complete   Palliative Care Screening Not Applicable

## 2024-02-14 NOTE — Discharge Summary (Signed)
 Physician Discharge Summary  Corey Park FMW:[id number] DOB: [birth year] DOA: 02/11/2024  PCP: Lorren Greig PARAS, NP  Admit date: 02/11/2024 Discharge date: 02/14/2024 Recommendations for Outpatient Follow-up:  Follow up with PCP in 1 weeks-call for appointment Please obtain BMP/CBC in one week  Fu Dr McCuen at 3:15 today  Discharge Dispo: home Discharge Condition: Stable Code Status:   Code Status: Full Code Diet recommendation:  Diet Order             Diet - low sodium heart healthy           Diet Heart Room service appropriate? Yes; Fluid consistency: Thin; Fluid restriction: 2000 mL Fluid  Diet effective now                    Brief/Interim Summary: 53 y.o. male with medical history significant of hypertension, hyperlipidemia, diabetes, GERD, CAD status post CABG earlier this year, anemia, chronic combined systolic and diastolic CHF, bilateral blindness, ESRD on HD, obesity presentined with chest pain and shortness of breath while getting ready for work 8/18.EMS was called and patient noted to be hypertensive in the 180s systolic.  Patient received aspirin  en route to the ED. He also reported headache left eye pain.  In the ED await further workup seen by nephrology ophthalmology, cardiology In the ED hemodynamically stable labs with hyperkalemia elevated troponin but flat Nephrology consulted and patient went for dialysis which was successful.Had 2 L removed but had continued chest pain after. CXR>cardiomegaly with changes consistent with pulmonary edema. CT head>no acute abnormality, showed severe atherosclerosis. Patient received fentanyl , Dilaudid , Coreg , irbesartan , insulin , glucose, Lokelma , albuterol , Reglan  in the Ed was srted on eyedrops. Echo obtained showing EF 45-50%, G2DD, RWMA,no pericardial effusion. Seen by ophthalmology, nephrology and cardiology. Continue pain management and outpatient follow-up with ophthalmology at this time.  Subjective: Seen and  examined  No new complaints, overnight afebrile BP stable Pain controlled and ready to go home, today  Discharge diagnosis:  Atypical chest pain, pleuritic CAD recent CABG Elevated troponin-flat: Cardiology input appreciated troponin has been flat and downtrending, no evidence of ACS, follow-up echo-howing EF 45-50%, G2DD, RWMA,no pericardial effusion. Continue pain management, blood pressure control AND his home  Plavix  and statin.  Off aspirin  per cardio.  Blindness of both eyes Headache and eye pain: Continue eyedrops to lower pressure for patient's glaucoma- ophthalmo following-w/ ongoing pain stopped erythromycin  ointment started Atropine  1% OS daily and Pednisolone OS qid.  Hopefully patient can be discharged with plan for outpatient  evisceration w/ oculoplastics-ophthalmology has informed that they the could see him on Thursday in the office for evaluation at 3:15 PM which will be added to his discharge instruction.  Will ask cardiology to evaluate him for general esthesia preop eval per ophthalmo-per Dr Court EF 45 to 50% by echo and he is 2-month status post CABG and suspect he is okay to undergo GA for eyenucleation He has appointment Thursday at 3.15 for ophthalmo office  Hypertension: Currently well-controlled Coreg  25, Avapro .  EESRD on HD MWF: Nephrology following for HD- last one 8/18- due on 8/20.  DM2: Well-controlled. Continue SSI Recent Labs  Lab 02/13/24 0609 02/13/24 1342 02/13/24 1633 02/13/24 2137 02/14/24 0555  GLUCAP 157* 97 149* 119* 101*    Chronic systolic heart failure Grade 2 diastolic dysfunction, chronic: address fluid with dialysis. The left ventricular  function has improved and wall motion better on new echo   GERD: Continue Pepcid   Anemia: Stable.   Class I Obesity w/  Body mass index is 30.02 kg/m.: Will benefit with PCP follow-up, weight loss,healthy lifestyle and outpatient sleep eval if not done.  Mobility:  Independent Mobility: PT Orders: Active PT Follow up Rec:     DVT prophylaxis: heparin  injection 5,000 Units Start: 02/11/24 1715 Code Status:   Code Status: Full Code Family Communication: plan of care discussed with patient/wife at bedside. Patient status is: Remains hospitalized because of severity of illness with ongoing pain and needing iv dilaudid  prn. Level of care: Telemetry Cardiac   Dispo: The patient is from: home having transportation issues.            Anticipated disposition: home   Objective: Vitals last 24 hrs: Vitals:   02/13/24 1930 02/13/24 2345 02/14/24 0317 02/14/24 0710  BP: 118/78 91/60 101/64 108/68  Pulse: 73 73 82 70  Resp: 17 14 16 13   Temp: 98.1 F (36.7 C) 98.2 F (36.8 C) 98 F (36.7 C) 98 F (36.7 C)  TempSrc: Oral Oral Oral Oral  SpO2: 99% 97% 94% 92%  Weight:      Height:        Physical Examination: General exam: AAOX3 HEENT:Oral mucosa moist, Ear/Nose WNL grossly Respiratory system: Bilaterally clear BS,no use of accessory muscle Cardiovascular system: S1 & S2 +, No JVD. Gastrointestinal system: Abdomen soft,NT,ND, BS+ Nervous System: AAOX3, non focal, blind Extremities: LE edema neg, distal extremities warm.  Skin: No rashes,no icterus. MSK: Normal muscle bulk,tone, power   Medications reviewed:  Scheduled Meds:  atorvastatin   80 mg Oral Daily   atropine   1 drop Left Eye Daily   brimonidine   1 drop Left Eye TID   calcium  acetate  2,001 mg Oral TID with meals   carvedilol   25 mg Oral BID WC   Chlorhexidine  Gluconate Cloth  6 each Topical Q0600   clopidogrel   75 mg Oral Daily   dorzolamide   1 drop Left Eye TID   famotidine   20 mg Oral Daily   feeding supplement  237 mL Oral BID BM   heparin   5,000 Units Subcutaneous Q8H   insulin  aspart  0-6 Units Subcutaneous TID WC   irbesartan   75 mg Oral Daily   latanoprost   1 drop Left Eye Daily   multivitamin  1 tablet Oral Daily   pantoprazole   20 mg Oral Daily   polyethylene glycol   17 g Oral Daily   prednisoLONE  acetate  1 drop Left Eye Q4H   timolol   1 drop Left Eye BID   Continuous Infusions:   Diet: Diet Order             Diet - low sodium heart healthy           Diet Heart Room service appropriate? Yes; Fluid consistency: Thin; Fluid restriction: 2000 mL Fluid  Diet effective now                     Consultation: See note.  Discharge Instructions  Discharge Instructions     Diet - low sodium heart healthy   Complete by: As directed    Discharge instructions   Complete by: As directed    If you have ongoing pain please follow-up with ophthalmology/oculoplastics  Please call call MD or return to ER for similar or worsening recurring problem that brought you to hospital or if any fever,nausea/vomiting,abdominal pain, uncontrolled pain, chest pain,  shortness of breath or any other alarming symptoms.  Please follow-up your doctor as instructed in a week time and call  the office for appointment.  Please avoid alcohol, smoking, or any other illicit substance and maintain healthy habits including taking your regular medications as prescribed.  You were cared for by a hospitalist during your hospital stay. If you have any questions about your discharge medications or the care you received while you were in the hospital after you are discharged, you can call the unit and ask to speak with the hospitalist on call if the hospitalist that took care of you is not available.  Once you are discharged, your primary care physician will handle any further medical issues. Please note that NO REFILLS for any discharge medications will be authorized once you are discharged, as it is imperative that you return to your primary care physician (or establish a relationship with a primary care physician if you do not have one) for your aftercare needs so that they can reassess your need for medications and monitor your lab values   Increase activity slowly   Complete by:  As directed    No wound care   Complete by: As directed       Allergies as of 02/14/2024       Reactions   Gabapentin     Hallucinations    Oxycodone     Hallucinations    Imdur  [isosorbide  Nitrate] Other (See Comments)   Endorsed headache in the past - willing to retry        Medication List     STOP taking these medications    aspirin  EC 81 MG tablet   olmesartan  40 MG tablet Commonly known as: BENICAR    valsartan 80 MG tablet Commonly known as: DIOVAN Replaced by: irbesartan  75 MG tablet       TAKE these medications    atorvastatin  80 MG tablet Commonly known as: LIPITOR  Take 1 tablet (80 mg total) by mouth daily.   atropine  1 % ophthalmic solution Place 1 drop into the left eye daily.   calcium  acetate 667 MG capsule Commonly known as: PHOSLO  Take 3 capsules (2,001 mg total) by mouth with breakfast, with lunch, and with evening meal.   carvedilol  25 MG tablet Commonly known as: COREG  Take 25 mg by mouth every morning.   clopidogrel  75 MG tablet Commonly known as: Plavix  Take 1 tablet (75 mg total) by mouth daily.   dorzolamide -timolol  2-0.5 % ophthalmic solution Commonly known as: COSOPT  Place 1 drop into the left eye 2 (two) times daily.   famotidine  20 MG tablet Commonly known as: PEPCID  Take 20 mg by mouth daily.   ferric citrate  1 GM 210 MG(Fe) tablet Commonly known as: Auryxia  Take 2 tablets (420 mg total) by mouth daily. What changed: when to take this   irbesartan  75 MG tablet Commonly known as: AVAPRO  Take 1 tablet (75 mg total) by mouth daily. Replaces: valsartan 80 MG tablet   latanoprost  0.005 % ophthalmic solution Commonly known as: XALATAN  Place 1 drop into the left eye daily.   multivitamin Tabs tablet Take 1 tablet by mouth daily.   pantoprazole  20 MG tablet Commonly known as: PROTONIX  Take 20 mg by mouth daily as needed for heartburn.   prednisoLONE  acetate 1 % ophthalmic suspension Commonly known as: PRED  FORTE Place 1 drop into the left eye every 4 (four) hours.   traMADol  50 MG tablet Commonly known as: ULTRAM  Take 1 tablet (50 mg total) by mouth every 12 (twelve) hours as needed for moderate pain (pain score 4-6).   Vitamin D  (Ergocalciferol ) 1.25 MG (50000 UNIT)  Caps capsule Commonly known as: DRISDOL  Take 1 capsule (50,000 Units total) by mouth every 7 (seven) days. What changed: when to take this   Vitamin D3 50 MCG (2000 UT) capsule Take 2,000 Units by mouth daily.               Durable Medical Equipment  (From admission, onward)           Start     Ordered   02/13/24 1517  For home use only DME 3 n 1  Once        02/13/24 1516            Follow-up Information     Lorren Greig PARAS, NP Follow up in 1 week(s).   Specialty: Nurse Practitioner Contact information: [address] [place] [address] [phone]         Leslee Reusing, MD Follow up in 1 week(s).   Specialty: Ophthalmology Why: PLEASE GO TO Her office thursday at 3.15 pm for evaluation and to prepare for surgery Contact information: [address] [place] [address] [phone]         [facility]  Follow up.   Contact information: [address] [place] [address] [phone]                Allergies  Allergen Reactions   Gabapentin      Hallucinations    Oxycodone      Hallucinations    Imdur  [Isosorbide  Nitrate] Other (See Comments)    Endorsed headache in the past - willing to retry    The results of significant diagnostics from this hospitalization (including imaging, microbiology, ancillary and laboratory) are listed below for reference.    Microbiology: Recent Results (from the past 240 hours)  MRSA Next Gen by PCR, Nasal     Status: None   Collection Time: 02/11/24  8:44 PM   Specimen: Nasal Mucosa; Nasal Swab  Result Value Ref Range Status   MRSA by PCR Next Gen NOT DETECTED NOT DETECTED  Final    Comment: (NOTE) The GeneXpert MRSA Assay (FDA approved for NASAL specimens only), is one component of a comprehensive MRSA colonization surveillance program. It is not intended to diagnose MRSA infection nor to guide or monitor treatment for MRSA infections. Test performance is not FDA approved in patients less than 55 years old. Performed at [facility], [address], [place], [place] [address]     Procedures/Studies: ECHOCARDIOGRAM COMPLETE Result Date: 02/12/2024    ECHOCARDIOGRAM REPORT   Patient Name:   Corey Park Date of Exam: 02/12/2024 Medical Rec #:  [id number]       Height:       73.0 in Accession #:    [id number]      Weight:       229.7 lb Date of Birth:  03-16-1971       BSA:          2.282 m Patient Age:    53 years        BP:           140/84 mmHg Patient Gender: M               HR:           75 bpm. Exam Location:  Inpatient Procedure: 2D Echo, Cardiac Doppler and Color Doppler (Both Spectral and Color            Flow Doppler were utilized  during procedure). Indications:    chest pain  History:        Patient has prior history of Echocardiogram examinations, most                 recent 06/25/2023. Risk Factors:Diabetes.  Sonographer:    Benard Stallion Referring Phys: [id number] MARSA B MELVIN IMPRESSIONS  1. Left ventricular ejection fraction, by estimation, is 45 to 50%. The left ventricle has mildly decreased function. The left ventricle demonstrates regional wall motion abnormalities (see scoring diagram/findings for description). Left ventricular diastolic parameters are consistent with Grade II diastolic dysfunction (pseudonormalization). Elevated left atrial pressure. There is mild hypokinesis of the left ventricular, basal-mid inferolateral wall.  2. Right ventricular systolic function is mildly reduced. The right ventricular size is normal. Tricuspid regurgitation signal is inadequate for assessing PA pressure.  3. Left atrial size was mildly dilated.  4.  The mitral valve is normal in structure. No evidence of mitral valve regurgitation.  5. The aortic valve is tricuspid. Aortic valve regurgitation is not visualized. Aortic valve sclerosis/calcification is present, without any evidence of aortic stenosis.  6. Aortic dilatation noted. There is borderline dilatation of the aortic root, measuring 39 mm. Comparison(s): Prior images reviewed side by side. The left ventricular function has improved. The left ventricular wall motion improved. FINDINGS  Left Ventricle: Left ventricular ejection fraction, by estimation, is 45 to 50%. The left ventricle has mildly decreased function. The left ventricle demonstrates regional wall motion abnormalities. Mild hypokinesis of the left ventricular, basal-mid inferolateral wall. The left ventricular internal cavity size was normal in size. There is no left ventricular hypertrophy. Abnormal (paradoxical) septal motion consistent with post-operative status. Left ventricular diastolic parameters are consistent with Grade II diastolic dysfunction (pseudonormalization). Elevated left atrial pressure. Right Ventricle: The right ventricular size is normal. No increase in right ventricular wall thickness. Right ventricular systolic function is mildly reduced. Tricuspid regurgitation signal is inadequate for assessing PA pressure. Left Atrium: Left atrial size was mildly dilated. Right Atrium: Right atrial size was normal in size. Pericardium: There is no evidence of pericardial effusion. Mitral Valve: The mitral valve is normal in structure. Mild mitral annular calcification. No evidence of mitral valve regurgitation. Tricuspid Valve: The tricuspid valve is normal in structure. Tricuspid valve regurgitation is not demonstrated. Aortic Valve: The aortic valve is tricuspid. Aortic valve regurgitation is not visualized. Aortic valve sclerosis/calcification is present, without any evidence of aortic stenosis. Aortic valve mean gradient measures  4.5 mmHg. Aortic valve peak gradient measures 8.5 mmHg. Aortic valve area, by VTI measures 2.71 cm. Pulmonic Valve: The pulmonic valve was grossly normal. Pulmonic valve regurgitation is mild. No evidence of pulmonic stenosis. Aorta: Aortic dilatation noted. There is borderline dilatation of the aortic root, measuring 39 mm. IAS/Shunts: No atrial level shunt detected by color flow Doppler.  LEFT VENTRICLE PLAX 2D LVIDd:         5.27 cm      Diastology LVIDs:         3.85 cm      LV e' medial:    5.11 cm/s LV PW:         1.10 cm      LV E/e' medial:  16.7 LV IVS:        1.10 cm      LV e' lateral:   8.00 cm/s LVOT diam:     2.40 cm      LV E/e' lateral: 10.6 LV SV:  76 LV SV Index:   34 LVOT Area:     4.52 cm  LV Volumes (MOD) LV vol d, MOD A2C: 141.0 ml LV vol d, MOD A4C: 150.0 ml LV vol s, MOD A2C: 87.5 ml LV vol s, MOD A4C: 82.8 ml LV SV MOD A2C:     53.5 ml LV SV MOD A4C:     150.0 ml LV SV MOD BP:      62.5 ml RIGHT VENTRICLE RV Basal diam:  3.30 cm RV Mid diam:    2.60 cm RV S prime:     7.72 cm/s TAPSE (M-mode): 1.4 cm LEFT ATRIUM             Index        RIGHT ATRIUM           Index LA diam:        4.20 cm 1.84 cm/m   RA Area:     12.40 cm LA Vol (A2C):   64.3 ml 28.18 ml/m  RA Volume:   29.60 ml  12.97 ml/m LA Vol (A4C):   71.8 ml 31.46 ml/m LA Biplane Vol: 73.0 ml 31.99 ml/m  AORTIC VALVE AV Area (Vmax):    2.83 cm AV Area (Vmean):   2.62 cm AV Area (VTI):     2.71 cm AV Vmax:           146.00 cm/s AV Vmean:          98.200 cm/s AV VTI:            0.282 m AV Peak Grad:      8.5 mmHg AV Mean Grad:      4.5 mmHg LVOT Vmax:         91.30 cm/s LVOT Vmean:        56.800 cm/s LVOT VTI:          0.169 m LVOT/AV VTI ratio: 0.60  AORTA Ao Root diam: 3.90 cm Ao Asc diam:  3.60 cm MITRAL VALVE               TRICUSPID VALVE MV Area (PHT): 3.60 cm    TR Peak grad:   8.4 mmHg MV Decel Time: 211 msec    TR Vmax:        145.00 cm/s MV E velocity: 85.20 cm/s MV A velocity: 69.00 cm/s  SHUNTS MV E/A ratio:   1.23        Systemic VTI:  0.17 m                            Systemic Diam: 2.40 cm Jerel Croitoru MD Electronically signed by Jerel Balding MD Signature Date/Time: 02/12/2024/2:15:23 PM    Final    CT Head Wo Contrast Result Date: 02/11/2024 CLINICAL DATA:  53 year old male with chest pain, shortness of breath, acute headache. Hypertensive, 182/90. EXAM: CT HEAD WITHOUT CONTRAST TECHNIQUE: Contiguous axial images were obtained from the base of the skull through the vertex without intravenous contrast. RADIATION DOSE REDUCTION: This exam was performed according to the departmental dose-optimization program which includes automated exposure control, adjustment of the mA and/or kV according to patient size and/or use of iterative reconstruction technique. COMPARISON:  Brain MRI 08/12/2023.  Head CT 09/01/2023. FINDINGS: Brain: Cerebral volume remains normal. No midline shift, ventriculomegaly, mass effect, evidence of mass lesion, intracranial hemorrhage or evidence of cortically based acute infarction. Stable minimal to mild periventricular white matter hypodensity by CT. Maintained gray-white  differentiation. Vascular: Severe Calcified atherosclerosis at the skull base. No suspicious intracranial vascular hyperdensity. Skull: Intact skull. Partially visible maxillary alveolus dental disease lucency. No acute osseous abnormality identified. Sinuses/Orbits: Stable mild paranasal sinus mucosal thickening. Tympanic cavities and mastoids remain well aerated. Other: Calcified scalp vessel atherosclerosis. No acute orbit or scalp soft tissue finding. IMPRESSION: 1. No acute intracranial abnormality. Stable mild for age cerebral white matter changes. 2. Severe calcified atherosclerosis, including scalp vessels, such as due to end-stage renal disease. 3. Partially visible maxillary dental disease. Electronically Signed   By: VEAR Hurst M.D.   On: 02/11/2024 07:17   DG Chest 2 View Result Date: 02/11/2024 CLINICAL DATA:   Chest pain shortness of breath. EXAM: CHEST - 2 VIEW COMPARISON:  11/20/2023 FINDINGS: The cardio pericardial silhouette is enlarged. Vascular congestion with subtle interstitial opacity suggesting edema. No focal consolidation. Small left pleural effusion suspected. Telemetry leads overlie the chest. IMPRESSION: Enlargement of the cardiopericardial silhouette with vascular congestion and subtle interstitial opacity suggesting edema. Electronically Signed   By: Camellia Candle M.D.   On: 02/11/2024 06:44    Labs: BNP (last 3 results) Recent Labs    07/25/23 2330 08/11/23 1448 02/11/24 0621  BNP 1,545.2* 2,012.6* 1,037.0*   Basic Metabolic Panel: Recent Labs  Lab 02/11/24 0619 02/12/24 0222  NA 139 135  K 5.9* 5.0  CL 100 95*  CO2 20* 25  GLUCOSE 126* 132*  BUN 75* 51*  CREATININE 17.00* 13.59*  CALCIUM  9.1 9.1   Liver Function Tests: Recent Labs  Lab 02/11/24 0620 02/12/24 0222  AST 20 19  ALT 16 15  ALKPHOS 72 62  BILITOT 1.0 1.1  PROT 8.1 7.6  ALBUMIN  3.5 3.2*   Recent Labs  Lab 02/11/24 0620  LIPASE 81*   No results for input(s): AMMONIA in the last 168 hours. CBC: Recent Labs  Lab 02/11/24 0619 02/12/24 0222  WBC 7.7 6.0  HGB 10.7* 10.9*  HCT 31.5* 31.8*  MCV 98.4 96.7  PLT 170 174   CBG: Recent Labs  Lab 02/13/24 0609 02/13/24 1342 02/13/24 1633 02/13/24 2137 02/14/24 0555  GLUCAP 157* 97 149* 119* 101*   Hgb A1c No results for input(s): HGBA1C in the last 72 hours. Anemia work up No results for input(s): VITAMINB12, FOLATE, FERRITIN, TIBC, IRON , RETICCTPCT in the last 72 hours. Cardiac Enzymes: No results for input(s): CKTOTAL, CKMB, CKMBINDEX, TROPONINI in the last 168 hours. BNP: Invalid input(s): POCBNP D-Dimer No results for input(s): DDIMER in the last 72 hours. Lipid Profile No results for input(s): CHOL, HDL, LDLCALC, TRIG, CHOLHDL, LDLDIRECT in the last 72 hours. Thyroid function  studies No results for input(s): TSH, T4TOTAL, T3FREE, THYROIDAB in the last 72 hours.  Invalid input(s): FREET3 Urinalysis    Component Value Date/Time   COLORURINE YELLOW 04/17/2022 1916   APPEARANCEUR CLEAR 04/17/2022 1916   LABSPEC 1.015 04/17/2022 1916   PHURINE 7.0 04/17/2022 1916   GLUCOSEU 50 (A) 04/17/2022 1916   HGBUR SMALL (A) 04/17/2022 1916   BILIRUBINUR NEGATIVE 04/17/2022 1916   KETONESUR NEGATIVE 04/17/2022 1916   PROTEINUR 100 (A) 04/17/2022 1916   NITRITE NEGATIVE 04/17/2022 1916   LEUKOCYTESUR TRACE (A) 04/17/2022 1916   Sepsis Labs Recent Labs  Lab 02/11/24 0619 02/12/24 0222  WBC 7.7 6.0   Microbiology Recent Results (from the past 240 hours)  MRSA Next Gen by PCR, Nasal     Status: None   Collection Time: 02/11/24  8:44 PM   Specimen: Nasal Mucosa; Nasal Swab  Result Value Ref Range Status   MRSA by PCR Next Gen NOT DETECTED NOT DETECTED Final    Comment: (NOTE) The GeneXpert MRSA Assay (FDA approved for NASAL specimens only), is one component of a comprehensive MRSA colonization surveillance program. It is not intended to diagnose MRSA infection nor to guide or monitor treatment for MRSA infections. Test performance is not FDA approved in patients less than 40 years old. Performed at [facility], [address], [place], [place] [address]      Time coordinating discharge: 35 minutes  SIGNED: Mennie LAMY, MD  [facility] 02/14/2024, 8:58 AM  If 7PM-7AM, please contact night-coverage [email or web address]

## 2024-02-14 NOTE — Evaluation (Signed)
 Physical Therapy Evaluation Patient Details Name: Corey Park MRN: [id number] DOB: [birth year] Today's Date: 02/14/2024  History of Present Illness  53 y.o. male presents to [facility] hospital on 02/11/2024 with chest pain, L eye pain and SOB. Workup notable for volume overload. 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  Clinical Impression  Pt is presenting close to baseline level of functioning. Currently pt is Mod I to supervision for all functional activities including bed mobility, sit to stand and gait. Pt requires unilateral UE support due to visual impairment and slightly impaired balance during gait which improved with distance. Currently pt is presenting close to baseline level of functioning and no skilled physical therapy services recommended. Pt will be discharged from skilled physical therapy services at this time; please re-consult if further needs arise.            If plan is discharge home, recommend the following: A little help with walking and/or transfers;Help with stairs or ramp for entrance;Assist for transportation;Assistance with cooking/housework     Equipment Recommendations None recommended by PT     Functional Status Assessment Patient has had a recent decline in their functional status and/or demonstrates limited ability to make significant improvements in function in a reasonable and predictable amount of time     Precautions / Restrictions Precautions Precautions: Fall Recall of Precautions/Restrictions: Intact Restrictions Weight Bearing Restrictions Per Provider Order: No      Mobility  Bed Mobility Overal bed mobility: Independent    Transfers Overall transfer level: Modified independent Equipment used: None     Ambulation/Gait Ambulation/Gait assistance: Supervision Gait Distance (Feet): 400 Feet Assistive device: None Gait Pattern/deviations: Step-through pattern, Decreased stride length, Wide  base of support, Narrow base of support, Staggering left, Staggering right Gait velocity: decreased Gait velocity interpretation: 1.31 - 2.62 ft/sec, indicative of limited community ambulator   General Gait Details: occasional staggering, narrow then wide BOS for balance. Short steps compared to leg length. Pt with one hand on therapist shoulder for balance and for guidance due to visual impairment.  Stairs Stairs:  (Aunt is going to assist with stairs at her house. Pt demonstrates sufficient strength with sit to stand and gait to perform per aunt home set up)             Balance Overall balance assessment: Needs assistance Sitting-balance support: Single extremity supported, Feet supported Sitting balance-Leahy Scale: Normal     Standing balance support: Single extremity supported, During functional activity Standing balance-Leahy Scale: Fair Standing balance comment: hip/stepping strategy for balance initially with gait. Improved with unilateral UE support.           Pertinent Vitals/Pain Pain Assessment Pain Assessment: Faces Faces Pain Scale: Hurts a little bit Pain Location: head & eyes Pain Descriptors / Indicators: Discomfort Pain Intervention(s): Monitored during session    Home Living Family/patient expects to be discharged to:: Private residence Living Arrangements: Alone Available Help at Discharge: Family;Available PRN/intermittently Type of Home: Apartment Home Access: Stairs to enter Entrance Stairs-Rails: Right;Left Entrance Stairs-Number of Steps: 16     Home Equipment: Agricultural consultant (2 wheels) Additional Comments: Pt is going home with aunt who lives in a one level handicap accessible home with 6 stairs w/rails to enter.    Prior Function Prior Level of Function : Independent/Modified Independent             Mobility Comments: use of RW prn since CABG, going to get a  white cane at work with mobility trainer ADLs Comments: Reports basin  bathing at baseline, son picks up clothes as set up assist     Extremity/Trunk Assessment   Upper Extremity Assessment Upper Extremity Assessment: Overall WFL for tasks assessed    Lower Extremity Assessment Lower Extremity Assessment: Overall WFL for tasks assessed    Cervical / Trunk Assessment Cervical / Trunk Assessment: Normal  Communication   Communication Communication: No apparent difficulties    Cognition Arousal: Alert Behavior During Therapy: WFL for tasks assessed/performed   PT - Cognitive impairments: No apparent impairments       Following commands: Intact       Cueing Cueing Techniques: Verbal cues     General Comments General comments (skin integrity, edema, etc.): Aunt present during session.        Assessment/Plan    PT Assessment Patient does not need any further PT services         PT Goals (Current goals can be found in the Care Plan section)  Acute Rehab PT Goals PT Goal Formulation: All assessment and education complete, DC therapy            AM-PAC PT 6 Clicks Mobility  Outcome Measure Help needed turning from your back to your side while in a flat bed without using bedrails?: None Help needed moving from lying on your back to sitting on the side of a flat bed without using bedrails?: None Help needed moving to and from a bed to a chair (including a wheelchair)?: None Help needed standing up from a chair using your arms (e.g., wheelchair or bedside chair)?: None Help needed to walk in hospital room?: A Little Help needed climbing 3-5 steps with a railing? : A Little 6 Click Score: 22    End of Session   Activity Tolerance: Patient tolerated treatment well Patient left: in bed;with call bell/phone within reach;with family/visitor present Nurse Communication: Mobility status      Time: 1010-1030 PT Time Calculation (min) (ACUTE ONLY): 20 min   Charges:   PT Evaluation $PT Eval Low Complexity: 1 Low   PT General  Charges $$ ACUTE PT VISIT: 1 Visit        Dorothyann Maier, DPT, CLT  Acute Rehabilitation Services Office: [phone] (Secure chat preferred)   Dorothyann VEAR Maier 02/14/2024, 10:38 AM

## 2024-02-14 NOTE — Progress Notes (Signed)
 D/C order noted. Contacted [facility] on [address] to be advised of pt's d/c today and that pt should resume care tomorrow.   Randine Mungo Dialysis Navigator [phone]

## 2024-02-15 ENCOUNTER — Telehealth: Payer: Self-pay | Admitting: *Deleted

## 2024-02-15 NOTE — Transitions of Care (Post Inpatient/ED Visit) (Signed)
   02/15/2024  Name: Corey Park MRN: [id number] DOB: 10/18/1970  Today's TOC FU Call Status: Today's TOC FU Call Status:: Unsuccessful Call (1st Attempt) Unsuccessful Call (1st Attempt) Date: 02/15/24  Attempted to reach the patient regarding the most recent Inpatient/ED visit.  Follow Up Plan: Additional outreach attempts will be made to reach the patient to complete the Transitions of Care (Post Inpatient/ED visit) call.   Andrea Dimes RN, BSN [place]  [facility] RN Care Manager [phone]

## 2024-02-18 ENCOUNTER — Telehealth: Payer: Self-pay | Admitting: *Deleted

## 2024-02-18 ENCOUNTER — Telehealth: Payer: Self-pay | Admitting: Family

## 2024-02-18 NOTE — Telephone Encounter (Signed)
 A document form has been faxed: Home Health order, to be filled out by provider. Send document back via Fax within 7-days. Document is located in providers tray at front office.          Fax number: [phone]

## 2024-02-18 NOTE — Transitions of Care (Post Inpatient/ED Visit) (Signed)
   02/18/2024  Name: Corey Park MRN: [id number] DOB: 1970/10/07  Today's TOC FU Call Status: Today's TOC FU Call Status:: Unsuccessful Call (2nd Attempt) Unsuccessful Call (2nd Attempt) Date: 02/18/24  Attempted to reach the patient regarding the most recent Inpatient/ED visit.  Follow Up Plan: Additional outreach attempts will be made to reach the patient to complete the Transitions of Care (Post Inpatient/ED visit) call.   Andrea Dimes RN, BSN [place]  [facility] RN Care Manager [phone]

## 2024-02-19 ENCOUNTER — Telehealth: Payer: Self-pay

## 2024-02-19 NOTE — Transitions of Care (Post Inpatient/ED Visit) (Signed)
   02/19/2024  Name: Gurfateh Mcclain MRN: [id number] DOB: [birth year]  Today's TOC FU Call Status: Today's TOC FU Call Status:: Unsuccessful Call (3rd Attempt) Unsuccessful Call (3rd Attempt) Date: 02/19/24  Attempted to reach the patient regarding the most recent Inpatient/ED visit.  Follow Up Plan: No further outreach attempts will be made at this time. We have been unable to contact the patient.  Charles Niese J. Solana Coggin RN, MSN [facility] Health RN Care Manager Direct Dial: [phone]  Fax: [phone] Website: [email or web address]

## 2024-03-03 ENCOUNTER — Encounter: Payer: Self-pay | Admitting: *Deleted

## 2024-03-04 ENCOUNTER — Ambulatory Visit: Attending: Emergency Medicine | Admitting: Emergency Medicine

## 2024-03-04 NOTE — Progress Notes (Deleted)
  Cardiology Office Note:    Date:  03/04/2024  ID:  Corey Park, DOB 1971/05/23, MRN [id number] PCP: Lorren Greig PARAS, NP  [facility] Providers Cardiologist:  Oneil Parchment, MD { Click to update primary MD,subspecialty MD or APP then REFRESH:1}    {Click to Open Review  :1}   Patient Profile:       Chief Complaint: *** History of Present Illness:  Corey Park is a 53 y.o. male with visit-pertinent history of coronary artery bypass grafting, hypertension, hyperlipidemia, end-stage renal disease on hemodialysis Monday/Wednesday/Friday for the last 5 years, type 2 diabetes  He had NSTEMI in 05/2023 and underwent cardiac catheterization revealing three-vessel disease and ultimately had CABG (LIMA-LAD, R SVG-distal RCA, OM) by Dr. Shyrl on 06/28/2023.  IntraOp TEE 06/28/2023 showed LVEF of 40%.  He was seen in the hospital by cardiology service on 02/11/2024 for atypical chest pain.  He apparently missed dialysis twice and developed chest pressure.  He developed headache and atypical chest pain described as pleuritic.  Troponins mildly elevated in the low 100 range but flat consistent with end-stage renal disease.  Blood pressure improved after hemodialysis session and initiation of patient's PTA medications including carvedilol  and irbesartan .  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  He was discharged home in stable condition  Discussed the use of AI scribe software for clinical note transcription with the patient, who gave verbal consent to proceed.  History of Present Illness     Review of systems:  Please see the history of present illness. All other systems are reviewed and otherwise negative. ***      Studies Reviewed:        ***  Risk  Assessment/Calculations:   {Does this patient have ATRIAL FIBRILLATION?:[phone]} No BP recorded.  {Refresh Note OR Click here to enter BP  :1}***        Physical Exam:   VS:  There were no vitals taken for this visit.   Wt Readings from Last 3 Encounters:  02/13/24 227 lb 8.2 oz (103.2 kg)  11/20/23 215 lb (97.5 kg)  11/01/23 215 lb (97.5 kg)    GEN: Well nourished, well developed in no acute distress NECK: No JVD; No carotid bruits CARDIAC: ***RRR, no murmurs, rubs, gallops RESPIRATORY:  Clear to auscultation without rales, wheezing or rhonchi  ABDOMEN: Soft, non-tender, non-distended EXTREMITIES:  No edema; No acute deformity ***      Assessment and Plan:    Assessment and Plan Assessment & Plan      {Are you ordering a CV Procedure (e.g. stress test, cath, DCCV, TEE, etc)?   Press F2        :[phone]}  Dispo:  No follow-ups on file.  Signed, Lum LITTIE Louis, NP

## 2024-03-10 ENCOUNTER — Other Ambulatory Visit: Payer: Self-pay

## 2024-03-10 ENCOUNTER — Emergency Department (HOSPITAL_COMMUNITY)

## 2024-03-10 ENCOUNTER — Encounter (HOSPITAL_COMMUNITY): Payer: Self-pay

## 2024-03-10 ENCOUNTER — Emergency Department (HOSPITAL_COMMUNITY): Admission: EM | Admit: 2024-03-10 | Discharge: 2024-03-10 | Disposition: A | Discharge status: Home / Self Care

## 2024-03-10 DIAGNOSIS — K5900 Constipation, unspecified: Secondary | ICD-10-CM | POA: Diagnosis not present

## 2024-03-10 DIAGNOSIS — R519 Headache, unspecified: Secondary | ICD-10-CM | POA: Insufficient documentation

## 2024-03-10 DIAGNOSIS — N186 End stage renal disease: Secondary | ICD-10-CM | POA: Diagnosis not present

## 2024-03-10 DIAGNOSIS — H5713 Ocular pain, bilateral: Secondary | ICD-10-CM | POA: Insufficient documentation

## 2024-03-10 DIAGNOSIS — I120 Hypertensive chronic kidney disease with stage 5 chronic kidney disease or end stage renal disease: Secondary | ICD-10-CM | POA: Insufficient documentation

## 2024-03-10 LAB — COMPREHENSIVE METABOLIC PANEL WITH GFR
ALT: 14 U/L (ref 0–44)
AST: 18 U/L (ref 15–41)
Albumin: 3.2 g/dL — ABNORMAL LOW (ref 3.5–5.0)
Alkaline Phosphatase: 64 U/L (ref 38–126)
Anion gap: 23 — ABNORMAL HIGH (ref 5–15)
BUN: 92 mg/dL — ABNORMAL HIGH (ref 6–20)
CO2: 19 mmol/L — ABNORMAL LOW (ref 22–32)
Calcium: 9.3 mg/dL (ref 8.9–10.3)
Chloride: 99 mmol/L (ref 98–111)
Creatinine, Ser: 18.28 mg/dL — ABNORMAL HIGH (ref 0.61–1.24)
GFR, Estimated: 3 mL/min — ABNORMAL LOW (ref >60)
Glucose, Bld: 97 mg/dL (ref 70–99)
Potassium: 5.1 mmol/L (ref 3.5–5.1)
Sodium: 141 mmol/L (ref 135–145)
Total Bilirubin: 1 mg/dL (ref 0.0–1.2)
Total Protein: 7.4 g/dL (ref 6.5–8.1)

## 2024-03-10 LAB — CBC WITH DIFFERENTIAL/PLATELET
Abs Immature Granulocytes: 0.01 K/uL (ref 0.00–0.07)
Basophils Absolute: 0 K/uL (ref 0.0–0.1)
Basophils Relative: 0 %
Eosinophils Absolute: 0.2 K/uL (ref 0.0–0.5)
Eosinophils Relative: 3 %
HCT: 30.3 % — ABNORMAL LOW (ref 39.0–52.0)
Hemoglobin: 9.9 g/dL — ABNORMAL LOW (ref 13.0–17.0)
Immature Granulocytes: 0 %
Lymphocytes Relative: 22 %
Lymphs Abs: 1.6 K/uL (ref 0.7–4.0)
MCH: 34.7 pg — ABNORMAL HIGH (ref 26.0–34.0)
MCHC: 32.7 g/dL (ref 30.0–36.0)
MCV: 106.3 fL — ABNORMAL HIGH (ref 80.0–100.0)
Monocytes Absolute: 0.8 K/uL (ref 0.1–1.0)
Monocytes Relative: 11 %
Neutro Abs: 4.6 K/uL (ref 1.7–7.7)
Neutrophils Relative %: 64 %
Platelets: 161 K/uL (ref 150–400)
RBC: 2.85 MIL/uL — ABNORMAL LOW (ref 4.22–5.81)
RDW: 15.2 % (ref 11.5–15.5)
WBC: 7.2 K/uL (ref 4.0–10.5)
nRBC: 0 % (ref 0.0–0.2)

## 2024-03-10 LAB — LIPASE, BLOOD: Lipase: 66 U/L — ABNORMAL HIGH (ref 11–51)

## 2024-03-10 MED ORDER — ERYTHROMYCIN 5 MG/GM OP OINT
1.0000 | TOPICAL_OINTMENT | Freq: Once | OPHTHALMIC | Status: AC
  Administered 2024-03-10: 1 via OPHTHALMIC
  Filled 2024-03-10: qty 3.5

## 2024-03-10 MED ORDER — ERYTHROMYCIN 5 MG/GM OP OINT: TOPICAL_OINTMENT | OPHTHALMIC | 0 refills | Status: AC

## 2024-03-10 MED ORDER — FLUORESCEIN SODIUM 1 MG OP STRP
1.0000 | ORAL_STRIP | Freq: Once | OPHTHALMIC | Status: AC
  Administered 2024-03-10: 1 via OPHTHALMIC
  Filled 2024-03-10: qty 1

## 2024-03-10 MED ORDER — BUTALBITAL-APAP-CAFFEINE 50-325-40 MG PO TABS: 1.0000 | ORAL_TABLET | Freq: Once | ORAL | Status: DC

## 2024-03-10 MED ORDER — BUTALBITAL-APAP-CAFFEINE 50-325-40 MG PO TABS
1.0000 | ORAL_TABLET | Freq: Once | ORAL | Status: AC
  Administered 2024-03-10: 1 via ORAL
  Filled 2024-03-10: qty 1

## 2024-03-10 MED ORDER — OXYCODONE HCL 5 MG PO TABS: 5.0000 mg | ORAL_TABLET | Freq: Four times a day (QID) | ORAL | 0 refills | Status: DC | PRN

## 2024-03-10 MED ORDER — DIPHENHYDRAMINE HCL 50 MG/ML IJ SOLN
25.0000 mg | Freq: Once | INTRAMUSCULAR | Status: DC
Start: 2024-03-10 — End: 2024-03-10

## 2024-03-10 MED ORDER — LACTULOSE 10 GM/15ML PO SOLN: 10.0000 g | Freq: Every day | ORAL | 0 refills | Status: DC | PRN

## 2024-03-10 MED ORDER — HYDROMORPHONE HCL 1 MG/ML IJ SOLN
1.0000 mg | Freq: Once | INTRAMUSCULAR | Status: AC
  Administered 2024-03-10: 1 mg via INTRAMUSCULAR
  Filled 2024-03-10: qty 1

## 2024-03-10 MED ORDER — TETRACAINE HCL 0.5 % OP SOLN
1.0000 [drp] | Freq: Once | OPHTHALMIC | Status: AC
  Administered 2024-03-10: 1 [drp] via OPHTHALMIC
  Filled 2024-03-10: qty 4

## 2024-03-10 MED ORDER — HYOSCYAMINE SULFATE 0.125 MG SL SUBL
0.2500 mg | SUBLINGUAL_TABLET | Freq: Once | SUBLINGUAL | Status: AC
  Administered 2024-03-10: 0.25 mg via SUBLINGUAL
  Filled 2024-03-10: qty 2

## 2024-03-10 MED ORDER — PROCHLORPERAZINE EDISYLATE 10 MG/2ML IJ SOLN: 10.0000 mg | Freq: Once | INTRAMUSCULAR | Status: DC

## 2024-03-10 NOTE — ED Notes (Signed)
 Attempted to ambulate pt to check pulse oximetry, pt stated he could not do that right now

## 2024-03-10 NOTE — ED Notes (Signed)
 Patient verbalizes understanding of discharge instructions. Opportunity for questioning and answers were provided. Pt discharged from ED.

## 2024-03-10 NOTE — ED Triage Notes (Signed)
 Pt bib ems ; c/o Heartburn that began this am, constipation x 10 days, abd pain, and HA; pt also missed last dialysis treatment, last dialyzed Wednesday; BP 220/120, HR 86, 97% RA

## 2024-03-10 NOTE — ED Provider Notes (Signed)
 [facility] Provider Note   CSN: [id number] Arrival date & time: 03/10/24  9282     Patient presents with: Abdominal Pain, Gastroesophageal Reflux (headach), and Headache   Corey Park is a 53 y.o. male.   53 year old male with past medical history of end-stage renal disease as well as hypertension presenting to the emergency department today with abdominal pain and headache.  Patient states that he woke up around 6 send around 615 he developed a headache.  Reports that this has gradually worsened.  He states he normally does not get headaches at this the ordinary for him.  He denies any associated nausea or vomiting.  He states that he has been having some diffuse abdominal discomfort as well.  Reports his last bowel movement was 10 days ago.  The patient denies any fevers or chills.  Denies any nausea or vomiting.  The patient is still passing flatus.  He came to the ER today due to the symptoms.  He denies any focal weakness, numbness, or tingling.   Abdominal Pain Gastroesophageal Reflux Associated symptoms include abdominal pain and headaches.  Headache Associated symptoms: abdominal pain        Prior to Admission medications   Medication Sig Start Date End Date Taking? Authorizing Provider  erythromycin  ophthalmic ointment Place a 1/2 inch ribbon of ointment into the lower eyelid. 03/10/24  Yes Ula Prentice SAUNDERS, MD  lactulose  (CHRONULAC ) 10 GM/15ML solution Take 15 mLs (10 g total) by mouth daily as needed for mild constipation or moderate constipation. 03/10/24  Yes Ula Prentice SAUNDERS, MD  oxyCODONE  (ROXICODONE ) 5 MG immediate release tablet Take 1 tablet (5 mg total) by mouth every 6 (six) hours as needed for severe pain (pain score 7-10). 03/10/24  Yes Ula Prentice SAUNDERS, MD  atorvastatin  (LIPITOR ) 80 MG tablet Take 1 tablet (80 mg total) by mouth daily. 08/13/23   Samtani, Jai-Gurmukh, MD  atropine  1 % ophthalmic solution Place 1 drop into the  left eye daily. 02/13/24   Christobal Guadalajara, MD  latanoprost  (XALATAN ) 0.005 % ophthalmic solution Place 1 drop into the left eye daily. 02/13/24   Christobal Guadalajara, MD  calcium  acetate (PHOSLO ) 667 MG capsule Take 3 capsules (2,001 mg total) by mouth with breakfast, with lunch, and with evening meal. 07/08/23   Gold, Wayne E, PA-C  carvedilol  (COREG ) 25 MG tablet Take 25 mg by mouth every morning.    [provider]  Cholecalciferol (VITAMIN D3) 50 MCG (2000 UT) capsule Take 2,000 Units by mouth daily. 01/02/24   [provider]  clopidogrel  (PLAVIX ) 75 MG tablet Take 1 tablet (75 mg total) by mouth daily. 02/14/24 03/15/24  Christobal Guadalajara, MD  dorzolamide -timolol  (COSOPT ) 2-0.5 % ophthalmic solution Place 1 drop into the left eye 2 (two) times daily. 02/13/24   Christobal Guadalajara, MD  famotidine  (PEPCID ) 20 MG tablet Take 20 mg by mouth daily. 04/20/23   [provider]  ferric citrate  (AURYXIA ) 1 GM 210 MG(Fe) tablet Take 2 tablets (420 mg total) by mouth daily. Patient taking differently: Take 420 mg by mouth 3 (three) times daily with meals. 12/14/22   Tobie Gaines, DO  irbesartan  (AVAPRO ) 75 MG tablet Take 1 tablet (75 mg total) by mouth daily. 02/13/24 03/14/24  Christobal Guadalajara, MD  multivitamin (RENA-VIT) TABS tablet Take 1 tablet by mouth daily. 11/22/22   [provider]  pantoprazole  (PROTONIX ) 20 MG tablet Take 20 mg by mouth daily as needed for heartburn. 04/19/23  [provider]  prednisoLONE  acetate (PRED FORTE ) 1 % ophthalmic suspension Place 1 drop into the left eye every 4 (four) hours. 02/13/24   Christobal Guadalajara, MD  Vitamin D , Ergocalciferol , (DRISDOL ) 1.25 MG (50000 UNIT) CAPS capsule Take 1 capsule (50,000 Units total) by mouth every 7 (seven) days. Patient taking differently: Take 50,000 Units by mouth every Monday. 08/19/23   Samtani, Jai-Gurmukh, MD    Allergies: Gabapentin , Oxycodone , and Imdur  [isosorbide  nitrate]    Review of Systems  Gastrointestinal:  Positive for  abdominal pain.  Neurological:  Positive for headaches.  All other systems reviewed and are negative.   Updated Vital Signs BP (!) 159/82   Pulse 76   Temp 97.9 F (36.6 C) (Oral)   Resp 15   SpO2 97%   Physical Exam Vitals and nursing note reviewed.   Gen: NAD, chronically ill-appearing Eyes: PERRL, EOMI HEENT: no oropharyngeal swelling Neck: trachea midline Resp: clear to auscultation bilaterally Card: RRR, no murmurs, rubs, or gallops Abd: Mildly distended, diffusely tender with no guarding or rebound Extremities: no calf tenderness, no edema Vascular: 2+ radial pulses bilaterally, 2+ DP pulses bilaterally Neuro: Cranial nerves intact, equal strength and sensation throughout bilateral upper and lower extremities Skin: no rashes Psyc: acting appropriately   (all labs ordered are listed, but only abnormal results are displayed) Labs Reviewed  CBC WITH DIFFERENTIAL/PLATELET - Abnormal; Notable for the following components:      Result Value   RBC 2.85 (*)    Hemoglobin 9.9 (*)    HCT 30.3 (*)    MCV 106.3 (*)    MCH 34.7 (*)    All other components within normal limits  COMPREHENSIVE METABOLIC PANEL WITH GFR - Abnormal; Notable for the following components:   CO2 19 (*)    BUN 92 (*)    Creatinine, Ser 18.28 (*)    Albumin  3.2 (*)    GFR, Estimated 3 (*)    Anion gap 23 (*)    All other components within normal limits  LIPASE, BLOOD - Abnormal; Notable for the following components:   Lipase 66 (*)    All other components within normal limits    EKG: EKG Interpretation Date/Time:  Monday March 10 2024 08:15:28 EDT Ventricular Rate:  85 PR Interval:  182 QRS Duration:  113 QT Interval:  420 QTC Calculation: 500 R Axis:   -37  Text Interpretation: Sinus rhythm LVH with secondary repolarization abnormality Anterior ST elevation, probably due to LVH Borderline prolonged QT interval Confirmed by Ula Barter [phone]) on 03/10/2024 8:18:04 AM  Radiology: CT  ABDOMEN PELVIS WO CONTRAST Result Date: 03/10/2024 CLINICAL DATA:  Abdominal pain, acute, nonlocalized. EXAM: CT ABDOMEN AND PELVIS WITHOUT CONTRAST TECHNIQUE: Multidetector CT imaging of the abdomen and pelvis was performed following the standard protocol without IV contrast. RADIATION DOSE REDUCTION: This exam was performed according to the departmental dose-optimization program which includes automated exposure control, adjustment of the mA and/or kV according to patient size and/or use of iterative reconstruction technique. COMPARISON:  CT angiography chest, abdomen and pelvis from 06/01/2023. FINDINGS: Lower chest: There are patchy atelectatic changes in the visualized lung bases. No overt consolidation. No pleural effusion. The heart is enlarged in size. No pericardial effusion. Hepatobiliary: The liver is normal in size. Non-cirrhotic configuration. No suspicious mass. No intrahepatic or extrahepatic bile duct dilation. Chronically contracted gallbladder noted with hyperattenuating contents. No pericholecystic fat stranding or free fluid. Findings favor chronic cholecystitis. Pancreas: Unremarkable. No pancreatic ductal dilatation or surrounding inflammatory  changes. Spleen: Within normal limits. No focal lesion. Adrenals/Urinary Tract: Adrenal glands are unremarkable. No suspicious renal mass within the limitations of this unenhanced exam. Moderate renal vascular calcifications noted. No nephroureterolithiasis or obstructive uropathy. Bilateral kidneys exhibit at least mild diffuse atrophy. Unremarkable urinary bladder. Stomach/Bowel: No disproportionate dilation of the small or large bowel loops. No evidence of abnormal bowel wall thickening or inflammatory changes. The appendix is unremarkable. Vascular/Lymphatic: No ascites or pneumoperitoneum. No abdominal or pelvic lymphadenopathy, by size criteria. No aneurysmal dilation of the major abdominal arteries. There are marked peripheral atherosclerotic  vascular calcifications of the aorta and its major branches. Reproductive: Normal size prostate. Symmetric seminal vesicles. Other: There are small fat containing umbilical and left inguinal hernias. The soft tissues and abdominal wall are otherwise unremarkable. Musculoskeletal: No suspicious osseous lesions. There are mild multilevel degenerative changes in the visualized spine. IMPRESSION: 1. No acute inflammatory process identified within the abdomen or pelvis. 2. Multiple chronic observations, as described above. Aortic Atherosclerosis (ICD10-I70.0). Electronically Signed   By: Ree Molt M.D.   On: 03/10/2024 09:44   CT Head Wo Contrast Result Date: 03/10/2024 CLINICAL DATA:  Headache, increasing frequency or severity. EXAM: CT HEAD WITHOUT CONTRAST TECHNIQUE: Contiguous axial images were obtained from the base of the skull through the vertex without intravenous contrast. RADIATION DOSE REDUCTION: This exam was performed according to the departmental dose-optimization program which includes automated exposure control, adjustment of the mA and/or kV according to patient size and/or use of iterative reconstruction technique. COMPARISON:  CT head from 02/11/2024. FINDINGS: Brain: No evidence of acute infarction, hemorrhage, hydrocephalus, extra-axial collection or mass lesion/mass effect. There is bilateral periventricular hypodensity, which is non-specific but most likely seen in the settings of microvascular ischemic changes. Mild in extent. Otherwise normal appearance of brain parenchyma. Ventricles are normal. Cerebral volume is age appropriate. Vascular: No hyperdense vessel or unexpected calcification. Intracranial arteriosclerosis. There is also extensive extracranial arteriosclerosis. Skull: Normal. Negative for fracture or focal lesion. Sinuses/Orbits: No acute finding. Mild mucoperiosteal thickening noted in the bilateral ethmoidal air cells and bilateral maxillary sinus. No air-fluid levels.  Other: Visualized mastoid air cells are unremarkable. No mastoid effusion. IMPRESSION: *No acute intracranial abnormality. Electronically Signed   By: Ree Molt M.D.   On: 03/10/2024 09:19   DG Chest Portable 1 View Result Date: 03/10/2024 CLINICAL DATA:  Shortness of breath. EXAM: PORTABLE CHEST 1 VIEW COMPARISON:  02/11/2024. FINDINGS: Low lung volume. There is heterogeneous left retrocardiac opacity partially obscuring the ascending thoracic aorta, which may represent combination of atelectasis and/or consolidation. There is mild to moderate diffuse pulmonary vascular congestion, likely accentuated by low lung volume. There is subtle blunting of bilateral lateral costophrenic angles, which may represent bilateral trace pleural effusions. No pneumothorax. Stable cardio-mediastinal silhouette. There are surgical staples along the heart border and sternotomy wires, status post CABG (coronary artery bypass graft). No acute osseous abnormalities. The soft tissues are within normal limits. IMPRESSION: 1. Heterogeneous left retrocardiac opacity, which may represent combination of atelectasis and/or consolidation. 2. Mild-to-moderate diffuse pulmonary vascular congestion and bilateral trace pleural effusions concerning for mild congestive heart failure/pulmonary edema. Correlate clinically. Electronically Signed   By: Ree Molt M.D.   On: 03/10/2024 08:42     Procedures   Medications Ordered in the ED  erythromycin  ophthalmic ointment 1 Application (has no administration in time range)  hyoscyamine  (LEVSIN  SL) SL tablet 0.25 mg (0.25 mg Sublingual Given 03/10/24 0921)  butalbital -acetaminophen -caffeine  (FIORICET ) 50-325-40 MG per tablet 1 tablet (1 tablet  Oral Given 03/10/24 0921)  HYDROmorphone  (DILAUDID ) injection 1 mg (1 mg Intramuscular Given 03/10/24 1044)  tetracaine  (PONTOCAINE) 0.5 % ophthalmic solution 1 drop (1 drop Both Eyes Given by Other 03/10/24 1100)  fluorescein  ophthalmic strip 1  strip (1 strip Both Eyes Given by Other 03/10/24 1100)                                    Medical Decision Making 53 year old male with past medical history of end-stage renal disease and hypertension presenting to the emergency department today with headache and abdominal pain.  I will further evaluate the patient here with basic labs including LFTs and a lipase to evaluate for hepatobiliary pathology or pancreatitis.  Will also obtain an EKG to evaluate for cardiac etiology given the location of the pain.  If this looks relatively similar to previous EKGs we will hold off on troponins at this time.  I will obtain a CT scan of the patient's head as this headache is out of the ordinary for the patient.  Based on description of his symptoms suspicion for subarachnoid hemorrhage is relatively low but noncontrast CT scan within 6 hours should evaluate for this.  Will also obtain a CT scan of his abdomen to evaluate for obstruction, diverticulitis, colitis, or other intra-abdominal pathology.  I will give the patient GI cocktail here.  Will give him Compazine  and Benadryl  in the event this due to migrainous headache.  I will reevaluate for ultimate disposition.  Patient also reports missing dialysis on Friday.  Labs will further evaluate here for any electrolyte abnormalities or hyperkalemia.  Will also obtain a portable chest x-ray to evaluate for pulmonary edema.  The patient's chest x-ray does show some mild pulmonary edema.  The patient's pulse ox is 97 to 100% here on room air.  His potassium was within normal limits and he does not meet criteria for emergent dialysis at this time.  The patient CT scans were reassuring.  He is given lactulose  for the constipation.  He was complaining more of the eye pain.  The patient only sees shadows at baseline and is legally blind.  He reports that this is similar to previous visual acuity.  The patient reports that he does have a history of glaucoma and has multiple  eyedrops at home to take for this.  I did call and discussed this with Dr. Regenia.  I did check the patient's eye pressures here and they were reading 3 and 5 on the right and 3 and 3 on the left.  I discussed this with Dr. Regenia and he felt that this may be due to the progression of his glaucoma and recommends having the patient follow-up as an outpatient.  He does recommend starting the patient on erythromycin  ointment which is started here.  I will give the patient a short course of pain medication here to take.  He is discharged with return precautions.  Amount and/or Complexity of Data Reviewed Labs: ordered. Radiology: ordered.  Risk Prescription drug management.        Final diagnoses:  Eye pain, bilateral  Constipation, unspecified constipation type    ED Discharge Orders          Ordered    erythromycin  ophthalmic ointment        03/10/24 1416    oxyCODONE  (ROXICODONE ) 5 MG immediate release tablet  Every 6 hours PRN  03/10/24 1416    lactulose  (CHRONULAC ) 10 GM/15ML solution  Daily PRN        03/10/24 1416               Ula Prentice SAUNDERS, MD 03/10/24 1420

## 2024-03-10 NOTE — Discharge Instructions (Addendum)
 Please take the lactulose  as needed until you have a bowel movement.  Call your dialysis center tomorrow to get in for dialysis.  Use the erythromycin  ointment and call to schedule a follow-up appointment with the ophthalmologist at the number provided or follow-up with the ophthalmologist that you have seen as an outpatient.  Please return to the emergency department for worsening symptoms.SABRA

## 2024-03-11 ENCOUNTER — Emergency Department (HOSPITAL_COMMUNITY)
Admission: EM | Admit: 2024-03-11 | Discharge: 2024-03-11 | Discharge status: Against Medical Advice | Attending: Emergency Medicine | Admitting: Emergency Medicine

## 2024-03-11 DIAGNOSIS — H5712 Ocular pain, left eye: Secondary | ICD-10-CM | POA: Diagnosis present

## 2024-03-11 DIAGNOSIS — K5900 Constipation, unspecified: Secondary | ICD-10-CM | POA: Diagnosis not present

## 2024-03-11 DIAGNOSIS — R519 Headache, unspecified: Secondary | ICD-10-CM | POA: Diagnosis not present

## 2024-03-11 DIAGNOSIS — Z5321 Procedure and treatment not carried out due to patient leaving prior to being seen by health care provider: Secondary | ICD-10-CM | POA: Insufficient documentation

## 2024-03-11 MED ORDER — IBUPROFEN 400 MG PO TABS
400.0000 mg | ORAL_TABLET | Freq: Once | ORAL | Status: AC | PRN
  Administered 2024-03-11: 400 mg via ORAL
  Filled 2024-03-11: qty 1

## 2024-03-11 NOTE — ED Provider Triage Note (Signed)
 Emergency Medicine Provider Triage Evaluation Note  Corey Park , a 53 y.o. male  was evaluated in triage.  Pt complains of headache and eye pain.  Patient was seen here yesterday for the same thing.  He did not have elevated intraocular pressure at that time and was treated for bacterial conjunctivitis. Presents today with same symptoms, no relief since starting the antibiotic.  Review of Systems  Positive: Left eye pain, discharge Negative: Vision change, legally blind.  Physical Exam  BP (!) 153/88   Pulse 83   Temp 99.1 F (37.3 C)   Resp 16   Ht 6' 1 (1.854 m)   SpO2 97%   BMI 30.02 kg/m  Gen:   Awake, no distress   Resp:  Normal effort  MSK:   Moves extremities without difficulty  Other:    Medical Decision Making  Medically screening exam initiated at 3:19 PM.  Appropriate orders placed.  Corey Park was informed that the remainder of the evaluation will be completed by another provider, this initial triage assessment does not replace that evaluation, and the importance of remaining in the ED until their evaluation is complete.  Will need formal eye exam to recheck IOP in ED room, but did have these checked about 24hrs ago.    Corey Warren SAILOR, PA-C 03/11/24 1521

## 2024-03-11 NOTE — Progress Notes (Signed)
 Pt receives op HD at [facility],MWF,15:55 chair time. Will continue to assist as needed.    [name] Dialysis Navigator [phone]  Late note entry 9/17 0901 Noted pt left ED, contacted clinic to expect him back today. No further assistance needed at this time.

## 2024-03-11 NOTE — ED Triage Notes (Signed)
 Pt BIB EMS from home, pt had dialysis this morning and headache developed after treatment. Dialysis took off 3.3 L of fluid. Post dialysis headache is not normal for him. Infection in left eye that he has been treating for past two days with eye drops. Pt has also been constipated for 11 days now, took oral laxatives but still has not had bowel movement. Hx of CHF and open heart surgery 06/2023. Pt is visually impaired at baseline

## 2024-03-11 NOTE — ED Notes (Signed)
 Pt stated they wanted to leave because they were tired of waiting, pt taken OTF.

## 2024-03-14 ENCOUNTER — Telehealth: Payer: Self-pay | Admitting: Family

## 2024-03-14 NOTE — Telephone Encounter (Signed)
 I will give PCP the paperwork when she returns on 03/17/2024

## 2024-03-14 NOTE — Telephone Encounter (Signed)
 PCP is out I will give it to her on 03/17/2024 to fill out

## 2024-03-14 NOTE — Telephone Encounter (Signed)
 A document form from [employer] has been faxed: Home Health Certificate (Order ID [id number]), to be filled out by provider. Send document back via Fax within 7-days. Document is located in providers tray at front office.          Fax number: [phone]

## 2024-03-17 ENCOUNTER — Ambulatory Visit (HOSPITAL_COMMUNITY)
Admission: RE | Admit: 2024-03-17 | Discharge: 2024-03-17 | Disposition: A | Discharge status: Home / Self Care | Attending: Surgery | Admitting: Surgery

## 2024-03-17 ENCOUNTER — Encounter (HOSPITAL_COMMUNITY)
Admission: RE | Disposition: A | Discharge status: Home / Self Care | Payer: Self-pay | Source: Home / Self Care | Attending: Surgery

## 2024-03-17 ENCOUNTER — Other Ambulatory Visit: Payer: Self-pay

## 2024-03-17 DIAGNOSIS — Z992 Dependence on renal dialysis: Secondary | ICD-10-CM | POA: Diagnosis not present

## 2024-03-17 DIAGNOSIS — I132 Hypertensive heart and chronic kidney disease with heart failure and with stage 5 chronic kidney disease, or end stage renal disease: Secondary | ICD-10-CM | POA: Diagnosis not present

## 2024-03-17 DIAGNOSIS — E1122 Type 2 diabetes mellitus with diabetic chronic kidney disease: Secondary | ICD-10-CM | POA: Diagnosis not present

## 2024-03-17 DIAGNOSIS — Y832 Surgical operation with anastomosis, bypass or graft as the cause of abnormal reaction of the patient, or of later complication, without mention of misadventure at the time of the procedure: Secondary | ICD-10-CM | POA: Insufficient documentation

## 2024-03-17 DIAGNOSIS — T82898A Other specified complication of vascular prosthetic devices, implants and grafts, initial encounter: Secondary | ICD-10-CM | POA: Diagnosis not present

## 2024-03-17 DIAGNOSIS — N186 End stage renal disease: Secondary | ICD-10-CM | POA: Insufficient documentation

## 2024-03-17 HISTORY — PX: A/V FISTULAGRAM: CATH118298

## 2024-03-17 LAB — GLUCOSE, CAPILLARY: Glucose-Capillary: 152 mg/dL — ABNORMAL HIGH (ref 70–99)

## 2024-03-17 SURGERY — A/V FISTULAGRAM
Anesthesia: LOCAL

## 2024-03-17 MED ORDER — LIDOCAINE HCL (PF) 1 % IJ SOLN
INTRAMUSCULAR | Status: AC
Start: 2024-03-17 — End: 2024-03-17
  Filled 2024-03-17: qty 30

## 2024-03-17 MED ORDER — LIDOCAINE HCL (PF) 1 % IJ SOLN
INTRAMUSCULAR | Status: DC | PRN
  Administered 2024-03-17: 10 mL

## 2024-03-17 MED ORDER — HEPARIN (PORCINE) IN NACL 1000-0.9 UT/500ML-% IV SOLN
INTRAVENOUS | Status: DC | PRN
  Administered 2024-03-17: 500 mL via SURGICAL_CAVITY

## 2024-03-17 MED ORDER — IODIXANOL 320 MG/ML IV SOLN
INTRAVENOUS | Status: DC | PRN
  Administered 2024-03-17: 20 mL

## 2024-03-17 SURGICAL SUPPLY — 5 items
KIT MICROPUNCTURE NIT STIFF (SHEATH) ×1 IMPLANT
KIT PV (KITS) ×2 IMPLANT
SHEATH PROBE COVER 6X72 (BAG) ×1 IMPLANT
TRAY PV CATH (CUSTOM PROCEDURE TRAY) ×2 IMPLANT
TUBING CIL FLEX 10 FLL-RA (TUBING) ×1 IMPLANT

## 2024-03-17 NOTE — Telephone Encounter (Signed)
 Faxed paperwork back on 03/17/2024

## 2024-03-17 NOTE — H&P (Signed)
 [facility] [place]  ASSESSMENT / PLAN: 53 y.o. male with end-stage renal disease on dialysis through a right arm brachiobasilic AV fistula.  Fistula is giving low flow rates at dialysis.  The patient is not experiencing any problems with the fistula.  Plan fistulogram today.  CHIEF COMPLAINT: End-stage renal disease  HISTORY OF PRESENT ILLNESS: Corey Park is a 53 y.o. male with end-stage renal disease referred to dialysis access center for treatment of low flow rates noted during dialysis sessions.  Patient has noted no problems with the fistula.  He has no complaints today.  Past Medical History:  Diagnosis Date   Acute blood loss anemia 04/17/2022   Acute encephalopathy 08/11/2023   Acute hypoxic respiratory failure (HCC) 04/02/2023   Adult general medical exam 07/11/2022   Allergy, unspecified, initial encounter 08/25/2019   Asthma    as a child   Cellulitis, perineum 11/26/2019   CHF (congestive heart failure) (HCC) 2021   Complication of vascular dialysis catheter 08/25/2019   COVID-19 virus infection 07/27/2019   Diabetes mellitus without complication (HCC)    type 2   Dilated cardiomyopathy (HCC) 07/03/2018   Elevated troponin    ESRD on hemodialysis (HCC) 06/17/2018   MWF at [facility]   Fe deficiency anemia 12/23/2018   Hypertension    Legally blind    B/L   NSTEMI (non-ST elevated myocardial infarction) (HCC) 06/25/2023   Pneumonia 2021   Sepsis (HCC) 04/17/2022   Type 2 diabetes mellitus with diabetic peripheral angiopathy without gangrene (HCC) 08/25/2019   Unspecified protein-calorie malnutrition (HCC) 08/25/2019    Past Surgical History:  Procedure Laterality Date   BASCILIC VEIN TRANSPOSITION Right 10/16/2019   Procedure: Basilic Vein Transposition Right Arm;  Surgeon: Serene Gaile ORN, MD;  Location: [facility] OR;  Service: Vascular;  Laterality: Right;   BASCILIC VEIN TRANSPOSITION Right 01/29/2020   Procedure: RIGHT ARM SECOND STAGE  BASCILIC VEIN TRANSPOSITION;  Surgeon: Serene Gaile ORN, MD;  Location: [facility] OR;  Service: Vascular;  Laterality: Right;   BIOPSY  12/22/2018   Procedure: BIOPSY;  Surgeon: Celestia Agent, MD;  Location: [facility] ENDOSCOPY;  Service: Endoscopy;;   CORONARY ARTERY BYPASS GRAFT N/A 06/28/2023   Procedure: CORONARY ARTERY BYPASS GRAFTING TIMES THREE USING LEFT INTERNAL MAMMARY ARTERY AND RIGHT GREAT SAPHENOUS VEIN HARVESTED ENDOSCOPICALLY;  Surgeon: Shyrl Linnie KIDD, MD;  Location: [facility] OR;  Service: Open Heart Surgery;  Laterality: N/A;   CORONARY ULTRASOUND/IVUS N/A 12/13/2022   Procedure: Coronary Ultrasound/IVUS;  Surgeon: Dann Candyce RAMAN, MD;  Location: [facility] INVASIVE CV LAB;  Service: Cardiovascular;  Laterality: N/A;   ESOPHAGOGASTRODUODENOSCOPY N/A 12/22/2018   Procedure: ESOPHAGOGASTRODUODENOSCOPY (EGD);  Surgeon: Celestia Agent, MD;  Location: [facility] ENDOSCOPY;  Service: Endoscopy;  Laterality: N/A;   IR FLUORO GUIDE CV LINE RIGHT  07/29/2019   IR US  GUIDE VASC ACCESS RIGHT  07/29/2019   LEFT HEART CATH AND CORONARY ANGIOGRAPHY N/A 12/13/2022   Procedure: LEFT HEART CATH AND CORONARY ANGIOGRAPHY;  Surgeon: Dann Candyce RAMAN, MD;  Location: [facility] INVASIVE CV LAB;  Service: Cardiovascular;  Laterality: N/A;   LEFT HEART CATH AND CORONARY ANGIOGRAPHY N/A 06/25/2023   Procedure: LEFT HEART CATH AND CORONARY ANGIOGRAPHY;  Surgeon: Verlin Lonni BIRCH, MD;  Location: [facility] INVASIVE CV LAB;  Service: Cardiovascular;  Laterality: N/A;   TEE WITHOUT CARDIOVERSION N/A 06/28/2023   Procedure: TRANSESOPHAGEAL ECHOCARDIOGRAM (TEE);  Surgeon: Shyrl Linnie KIDD, MD;  Location: [facility] OR;  Service: Open Heart Surgery;  Laterality: N/A;   TRANSESOPHAGEAL ECHOCARDIOGRAM (CATH LAB) N/A  05/10/2023   Procedure: TRANSESOPHAGEAL ECHOCARDIOGRAM;  Surgeon: Barbaraann Darryle Ned, MD;  Location: [facility] INVASIVE CV LAB;  Service: Cardiovascular;  Laterality: N/A;    Family History  Problem Relation Age of Onset   CAD Mother     Hypertension Mother    Diabetes Neg Hx    Stroke Neg Hx    Cancer Neg Hx    Kidney failure Neg Hx    Stomach cancer Neg Hx    Colon cancer Neg Hx    Rectal cancer Neg Hx     Social History   Socioeconomic History   Marital status: Single    Spouse name: Not on file   Number of children: Not on file   Years of education: Not on file   Highest education level: Not on file  Occupational History   Not on file  Tobacco Use   Smoking status: Never   Smokeless tobacco: Never  Vaping Use   Vaping status: Never Used  Substance and Sexual Activity   Alcohol use: Not Currently   Drug use: Not Currently    Types: Marijuana   Sexual activity: Not on file  Other Topics Concern   Not on file  Social History Narrative   Not on file   Social Drivers of Health   Financial Resource Strain: Low Risk  (11/01/2023)   Overall Financial Resource Strain (CARDIA)    Difficulty of Paying Living Expenses: Not very hard  Recent Concern: Financial Resource Strain - High Risk (08/23/2023)   Overall Financial Resource Strain (CARDIA)    Difficulty of Paying Living Expenses: Hard  Food Insecurity: No Food Insecurity (02/11/2024)   Hunger Vital Sign    Worried About Running Out of Food in the Last Year: Never true    Ran Out of Food in the Last Year: Never true  Transportation Needs: No Transportation Needs (02/11/2024)   PRAPARE - Administrator, Civil Service (Medical): No    Lack of Transportation (Non-Medical): No  Physical Activity: Insufficiently Active (11/01/2023)   Exercise Vital Sign    Days of Exercise per Week: 7 days    Minutes of Exercise per Session: 20 min  Stress: No Stress Concern Present (11/01/2023)   [employer] of Occupational Health - Occupational Stress Questionnaire    Feeling of Stress : Not at all  Social Connections: Moderately Integrated (11/01/2023)   Social Connection and Isolation Panel    Frequency of Communication with Friends and Family: More than  three times a week    Frequency of Social Gatherings with Friends and Family: More than three times a week    Attends Religious Services: More than 4 times per year    Active Member of [employer] or Organizations: Yes    Attends Banker Meetings: More than 4 times per year    Marital Status: Never married  Intimate Partner Violence: Not At Risk (02/11/2024)   Humiliation, Afraid, Rape, and Kick questionnaire    Fear of Current or Ex-Partner: No    Emotionally Abused: No    Physically Abused: No    Sexually Abused: No    Allergies  Allergen Reactions   Gabapentin      Hallucinations    Oxycodone      Hallucinations    Imdur  [Isosorbide  Nitrate] Other (See Comments)    Endorsed headache in the past - willing to retry    Current Facility-Administered Medications  Medication Dose Route Frequency Provider Last Rate Last Admin   iodixanol  (VISIPAQUE ) 320  MG/ML injection    PRN Magda Debby SAILOR, MD   20 mL at 03/17/24 1023   lidocaine  (PF) (XYLOCAINE ) 1 % injection    PRN Magda Debby SAILOR, MD   10 mL at 03/17/24 1019    PHYSICAL EXAM Vitals:   03/17/24 0810 03/17/24 0817 03/17/24 1010  BP: (!) 174/91    Pulse: 86    Resp: 12 16   Temp: 98.5 F (36.9 C)    TempSrc: Oral    SpO2: 97%  96%   No distress Regular rate and rhythm Unlabored breathing Strong thrill in right arm fistula  PERTINENT LABORATORY AND RADIOLOGIC DATA  Most recent CBC    Latest Ref Rng & Units 03/10/2024    7:39 AM 02/12/2024    2:22 AM 02/11/2024    6:19 AM  CBC  WBC 4.0 - 10.5 K/uL 7.2  6.0  7.7   Hemoglobin 13.0 - 17.0 g/dL 9.9  89.0  89.2   Hematocrit 39.0 - 52.0 % 30.3  31.8  31.5   Platelets 150 - 400 K/uL 161  174  170      Most recent CMP    Latest Ref Rng & Units 03/10/2024    7:39 AM 02/12/2024    2:22 AM 02/11/2024    6:20 AM  CMP  Glucose 70 - 99 mg/dL 97  867    BUN 6 - 20 mg/dL 92  51    Creatinine 9.38 - 1.24 mg/dL 81.71  86.40    Sodium 135 - 145 mmol/L 141  135     Potassium 3.5 - 5.1 mmol/L 5.1  5.0    Chloride 98 - 111 mmol/L 99  95    CO2 22 - 32 mmol/L 19  25    Calcium  8.9 - 10.3 mg/dL 9.3  9.1    Total Protein 6.5 - 8.1 g/dL 7.4  7.6  8.1   Total Bilirubin 0.0 - 1.2 mg/dL 1.0  1.1  1.0   Alkaline Phos 38 - 126 U/L 64  62  72   AST 15 - 41 U/L 18  19  20    ALT 0 - 44 U/L 14  15  16      Renal function CrCl cannot be calculated (Unknown ideal weight.).  Hgb A1c MFr Bld (%)  Date Value  06/26/2023 5.8 (H)    LDL Calculated  Date Value Ref Range Status  01/16/2019 109 (H) 0 - 99 mg/dL Final   LDL Cholesterol  Date Value Ref Range Status  12/11/2022 107 (H) 0 - 99 mg/dL Final    Comment:           Total Cholesterol/HDL:CHD Risk Coronary Heart Disease Risk Table                     Men   Women  1/2 Average Risk   3.4   3.3  Average Risk       5.0   4.4  2 X Average Risk   9.6   7.1  3 X Average Risk  23.4   11.0        Use the calculated Patient Ratio above and the CHD Risk Table to determine the patient's CHD Risk.        ATP III CLASSIFICATION (LDL):  <100     mg/dL   Optimal  899-870  mg/dL   Near or Above  Optimal  130-159  mg/dL   Borderline  839-810  mg/dL   High  >809     mg/dL   Very High Performed at [facility], [address] [place], [place] [address]     Debby SAILOR. Magda, MD FACS [facility] Phone Number: [phone] 03/17/2024 10:23 AM   Total time spent on preparing this encounter including chart review, data review, collecting history, examining the patient, and coordinating care: 30 min  Portions of this report may have been transcribed using voice recognition software.  Every effort has been made to ensure accuracy; however, inadvertent computerized transcription errors may still be present.

## 2024-03-17 NOTE — Op Note (Signed)
 DATE OF SERVICE: 03/17/2024  PATIENT:  Corey Park  53 y.o. male  PRE-OPERATIVE DIAGNOSIS:  end-stage renal disease  POST-OPERATIVE DIAGNOSIS:  Same  PROCEDURE:   1) Ultrasound guided right arm AVF access (CPT [phone]) 2) fistulagram (CPT [phone]) 3) established outpatient evaluation and management - level 3 (CPT 99213)  SURGEON:  Debby SAILOR. Magda, MD  ASSISTANT: none  ANESTHESIA:   local and IV sedation  ESTIMATED BLOOD LOSS: min  LOCAL MEDICATIONS USED:  LIDOCAINE    COUNTS: confirmed correct.  PATIENT DISPOSITION:  PACU - hemodynamically stable.   Delay start of Pharmacological VTE agent (>24hrs) due to surgical blood loss or risk of bleeding: no  INDICATION FOR PROCEDURE: Corey Park is a 53 y.o. male with ESRD on HD. Low flow rates have been noted in the fistula at dialysis sessions. After careful discussion of risks, benefits, and alternatives the patient was offered fistulagram. The patient understood and wished to proceed.  OPERATIVE FINDINGS:  Right Upper Extremity Central venous: no stenosis Subclavian vein: no stenosis Axillary vein: no stenosis Fistula: no stenosis; two areas of aneurysmal degeneration Anastomosis: no stenosis  DESCRIPTION OF PROCEDURE: After identification of the patient in the pre-operative holding area, the patient was transferred to the operating room. The patient was positioned supine on the operating room table.  The right upper extremity was prepped and draped in standard fashion. A surgical pause was performed confirming correct patient, procedure, and operative location.  The right upper extremity was anesthetized with subcutaneous injection of 1% lidocaine  over the area of planned access. Using ultrasound guidance, the right arm dialysis access was accessed with micropuncture technique.  Fistulogram was performed in stations with the micro sheath.  See above for details.  All endovascular equipment was removed.  A figure-of-eight  stitch was applied to the exit site with good hemostasis.  Sterile bandage was applied.  Upon completion of the case instrument and sharps counts were confirmed correct. The patient was transferred to the PACU in good condition. I was present for all portions of the procedure.  PLAN: No mechanical explanation for low flow rates. OK to use fistula.  Debby SAILOR. Magda, MD [facility] Phone Number: [phone] 03/17/2024 10:25 AM

## 2024-03-18 ENCOUNTER — Encounter (HOSPITAL_COMMUNITY): Payer: Self-pay | Admitting: Vascular Surgery

## 2024-03-18 ENCOUNTER — Telehealth (HOSPITAL_COMMUNITY): Payer: Self-pay | Admitting: Cardiology

## 2024-03-18 NOTE — Telephone Encounter (Signed)
 Called to confirm/remind patient of their appointment at the [facility] on 03/18/24.   Appointment:   [] Confirmed  [] Left mess   [x] No answer/No voice mail  [] VM Full/unable to leave message  [] Phone not in service  Patient reminded to bring all medications and/or complete list.  Confirmed patient has transportation. Gave directions, instructed to utilize valet parking.

## 2024-03-19 ENCOUNTER — Encounter (HOSPITAL_COMMUNITY): Admitting: Cardiology

## 2024-03-19 NOTE — Progress Notes (Incomplete)
   ADVANCED HEART FAILURE NEW PATIENT CLINIC NOTE  Referring Physician: Lorren Greig PARAS, NP  Primary Care: Lorren Greig PARAS, NP Primary Cardiologist:  HPI: Corey Park is a 53 y.o. male with a PMH of *** who presents for initial visit for further evaluation and treatment of heart failure/cardiomyopathy.      {Anything typed between these two boxes will persist and can be pulled forward to future notes. This phrase will delete itself when the note is signed :1}      SUBJECTIVE:   PMH, current medications, allergies, social history, and family history reviewed in epic.  PHYSICAL EXAM: There were no vitals filed for this visit. GENERAL: Well nourished and in no apparent distress at rest.  PULM:  Normal work of breathing, clear to auscultation bilaterally. Respirations are unlabored.  CARDIAC:  JVP: ***         Normal rate with regular rhythm. No murmurs, rubs or gallops.  *** edema. Warm and well perfused extremities. ABDOMEN: Soft, non-tender, non-distended. NEUROLOGIC: Patient is oriented x3 with no focal or lateralizing neurologic deficits.    DATA REVIEW  ECG: ***    ECHO: 02/12/2024: LVEF 45-50%, grade II DD, mildly reduced RV  CATH: 05/2023: Severe 3 vessel CAD with Lcx, LAD, and RCA disease including LM.    Heart failure review: - Classification: {HFCLASS:30917} - Etiology: {Cardiomyopathy:30918} - NYHA Class:  - Volume status: {volumechf:30919} - ACEi/ARB/ARNI: {HF:30752} - Aldosterone antagonist: {HF:30752} - Beta-blocker: {HF:30752} - Digoxin: {HF:30752} - Hydralazine /Nitrates: {HF:30752} - SGLT2i: {HF:30752} - GLP-1: {GLP:30906} - Advanced therapies: {Advancedtherapies:30916} - ICD: {ICD:30901}   ASSESSMENT & PLAN:  ***  Follow up in ***  Morene Brownie, MD Advanced Heart Failure Mechanical Circulatory Support 03/19/24

## 2024-07-03 ENCOUNTER — Emergency Department (HOSPITAL_COMMUNITY)

## 2024-07-03 ENCOUNTER — Telehealth: Payer: Self-pay | Admitting: Cardiology

## 2024-07-03 ENCOUNTER — Inpatient Hospital Stay (HOSPITAL_COMMUNITY)
Admission: EM | Admit: 2024-07-03 | Discharge: 2024-07-05 | DRG: 313 | Disposition: A | Attending: Internal Medicine | Admitting: Internal Medicine

## 2024-07-03 DIAGNOSIS — Z8249 Family history of ischemic heart disease and other diseases of the circulatory system: Secondary | ICD-10-CM

## 2024-07-03 DIAGNOSIS — K625 Hemorrhage of anus and rectum: Secondary | ICD-10-CM | POA: Diagnosis present

## 2024-07-03 DIAGNOSIS — Z8719 Personal history of other diseases of the digestive system: Secondary | ICD-10-CM

## 2024-07-03 DIAGNOSIS — R079 Chest pain, unspecified: Secondary | ICD-10-CM

## 2024-07-03 DIAGNOSIS — E1151 Type 2 diabetes mellitus with diabetic peripheral angiopathy without gangrene: Secondary | ICD-10-CM | POA: Diagnosis present

## 2024-07-03 DIAGNOSIS — Z888 Allergy status to other drugs, medicaments and biological substances status: Secondary | ICD-10-CM

## 2024-07-03 DIAGNOSIS — Z8616 Personal history of COVID-19: Secondary | ICD-10-CM

## 2024-07-03 DIAGNOSIS — Z951 Presence of aortocoronary bypass graft: Secondary | ICD-10-CM

## 2024-07-03 DIAGNOSIS — Z885 Allergy status to narcotic agent status: Secondary | ICD-10-CM

## 2024-07-03 DIAGNOSIS — I420 Dilated cardiomyopathy: Secondary | ICD-10-CM | POA: Diagnosis present

## 2024-07-03 DIAGNOSIS — N186 End stage renal disease: Secondary | ICD-10-CM | POA: Diagnosis present

## 2024-07-03 DIAGNOSIS — I132 Hypertensive heart and chronic kidney disease with heart failure and with stage 5 chronic kidney disease, or end stage renal disease: Secondary | ICD-10-CM | POA: Diagnosis present

## 2024-07-03 DIAGNOSIS — K5730 Diverticulosis of large intestine without perforation or abscess without bleeding: Secondary | ICD-10-CM | POA: Diagnosis present

## 2024-07-03 DIAGNOSIS — Z7902 Long term (current) use of antithrombotics/antiplatelets: Secondary | ICD-10-CM

## 2024-07-03 DIAGNOSIS — D631 Anemia in chronic kidney disease: Secondary | ICD-10-CM | POA: Diagnosis present

## 2024-07-03 DIAGNOSIS — H409 Unspecified glaucoma: Secondary | ICD-10-CM | POA: Diagnosis present

## 2024-07-03 DIAGNOSIS — K2950 Unspecified chronic gastritis without bleeding: Secondary | ICD-10-CM | POA: Diagnosis present

## 2024-07-03 DIAGNOSIS — Z8701 Personal history of pneumonia (recurrent): Secondary | ICD-10-CM

## 2024-07-03 DIAGNOSIS — I252 Old myocardial infarction: Secondary | ICD-10-CM

## 2024-07-03 DIAGNOSIS — Z992 Dependence on renal dialysis: Secondary | ICD-10-CM

## 2024-07-03 DIAGNOSIS — N2581 Secondary hyperparathyroidism of renal origin: Secondary | ICD-10-CM | POA: Diagnosis present

## 2024-07-03 DIAGNOSIS — H548 Legal blindness, as defined in USA: Secondary | ICD-10-CM | POA: Diagnosis present

## 2024-07-03 DIAGNOSIS — Z59868 Other specified financial insecurity: Secondary | ICD-10-CM

## 2024-07-03 DIAGNOSIS — Z79899 Other long term (current) drug therapy: Secondary | ICD-10-CM

## 2024-07-03 DIAGNOSIS — D179 Benign lipomatous neoplasm, unspecified: Secondary | ICD-10-CM | POA: Diagnosis present

## 2024-07-03 DIAGNOSIS — Z6830 Body mass index (BMI) 30.0-30.9, adult: Secondary | ICD-10-CM

## 2024-07-03 DIAGNOSIS — E785 Hyperlipidemia, unspecified: Secondary | ICD-10-CM | POA: Diagnosis present

## 2024-07-03 DIAGNOSIS — I5042 Chronic combined systolic (congestive) and diastolic (congestive) heart failure: Secondary | ICD-10-CM | POA: Diagnosis present

## 2024-07-03 DIAGNOSIS — K648 Other hemorrhoids: Secondary | ICD-10-CM | POA: Diagnosis present

## 2024-07-03 DIAGNOSIS — I2510 Atherosclerotic heart disease of native coronary artery without angina pectoris: Secondary | ICD-10-CM | POA: Diagnosis present

## 2024-07-03 DIAGNOSIS — N250 Renal osteodystrophy: Secondary | ICD-10-CM | POA: Diagnosis present

## 2024-07-03 DIAGNOSIS — K5909 Other constipation: Secondary | ICD-10-CM | POA: Diagnosis present

## 2024-07-03 DIAGNOSIS — Z8711 Personal history of peptic ulcer disease: Secondary | ICD-10-CM

## 2024-07-03 DIAGNOSIS — E1122 Type 2 diabetes mellitus with diabetic chronic kidney disease: Secondary | ICD-10-CM | POA: Diagnosis present

## 2024-07-03 DIAGNOSIS — K219 Gastro-esophageal reflux disease without esophagitis: Secondary | ICD-10-CM | POA: Diagnosis present

## 2024-07-03 LAB — ECHOCARDIOGRAM LIMITED
Area-P 1/2: 5.5 cm2
Calc EF: 38.2 %
S' Lateral: 4.4 cm
Single Plane A2C EF: 40.7 %
Single Plane A4C EF: 34.4 %

## 2024-07-03 LAB — RETICULOCYTES
Immature Retic Fract: 13.5 % (ref 2.3–15.9)
RBC.: 3.52 MIL/uL — ABNORMAL LOW (ref 4.22–5.81)
Retic Count, Absolute: 66.5 K/uL (ref 19.0–186.0)
Retic Ct Pct: 1.9 % (ref 0.4–3.1)

## 2024-07-03 LAB — VITAMIN B12: Vitamin B-12: 850 pg/mL (ref 180–914)

## 2024-07-03 LAB — TROPONIN T, HIGH SENSITIVITY
Troponin T High Sensitivity: 194 ng/L (ref 0–19)
Troponin T High Sensitivity: 196 ng/L (ref 0–19)

## 2024-07-03 LAB — HIV ANTIBODY (ROUTINE TESTING W REFLEX): HIV Screen 4th Generation wRfx: NONREACTIVE

## 2024-07-03 LAB — BASIC METABOLIC PANEL WITH GFR
Anion gap: 12 (ref 5–15)
BUN: 34 mg/dL — ABNORMAL HIGH (ref 6–20)
CO2: 29 mmol/L (ref 22–32)
Calcium: 9.9 mg/dL (ref 8.9–10.3)
Chloride: 94 mmol/L — ABNORMAL LOW (ref 98–111)
Creatinine, Ser: 8.78 mg/dL — ABNORMAL HIGH (ref 0.61–1.24)
GFR, Estimated: 7 mL/min — ABNORMAL LOW
Glucose, Bld: 146 mg/dL — ABNORMAL HIGH (ref 70–99)
Potassium: 4.6 mmol/L (ref 3.5–5.1)
Sodium: 136 mmol/L (ref 135–145)

## 2024-07-03 LAB — IRON AND TIBC
Iron: 117 ug/dL (ref 45–182)
Saturation Ratios: 52 % — ABNORMAL HIGH (ref 17.9–39.5)
TIBC: 227 ug/dL — ABNORMAL LOW (ref 250–450)
UIBC: 110 ug/dL

## 2024-07-03 LAB — CBC
HCT: 33.9 % — ABNORMAL LOW (ref 39.0–52.0)
Hemoglobin: 11.1 g/dL — ABNORMAL LOW (ref 13.0–17.0)
MCH: 32.7 pg (ref 26.0–34.0)
MCHC: 32.7 g/dL (ref 30.0–36.0)
MCV: 100 fL (ref 80.0–100.0)
Platelets: 193 K/uL (ref 150–400)
RBC: 3.39 MIL/uL — ABNORMAL LOW (ref 4.22–5.81)
RDW: 16.7 % — ABNORMAL HIGH (ref 11.5–15.5)
WBC: 7.5 K/uL (ref 4.0–10.5)
nRBC: 0 % (ref 0.0–0.2)

## 2024-07-03 LAB — TYPE AND SCREEN
ABO/RH(D): O POS
Antibody Screen: NEGATIVE

## 2024-07-03 LAB — HEMOGLOBIN A1C
Hgb A1c MFr Bld: 6.7 % — ABNORMAL HIGH (ref 4.8–5.6)
Mean Plasma Glucose: 145.59 mg/dL

## 2024-07-03 LAB — CBG MONITORING, ED: Glucose-Capillary: 96 mg/dL (ref 70–99)

## 2024-07-03 LAB — POC OCCULT BLOOD, ED: Fecal Occult Bld: NEGATIVE

## 2024-07-03 LAB — FOLATE: Folate: >20 ng/mL

## 2024-07-03 LAB — TSH: TSH: 1.11 u[IU]/mL (ref 0.350–4.500)

## 2024-07-03 LAB — T4, FREE: Free T4: 1.61 ng/dL (ref 0.80–2.00)

## 2024-07-03 LAB — FERRITIN: Ferritin: 1812 ng/mL — ABNORMAL HIGH (ref 24–336)

## 2024-07-03 MED ORDER — LATANOPROST 0.005 % OP SOLN
1.0000 [drp] | Freq: Every day | OPHTHALMIC | Status: DC
  Administered 2024-07-04 – 2024-07-05 (×2): 1 [drp] via OPHTHALMIC
  Filled 2024-07-03: qty 2.5

## 2024-07-03 MED ORDER — ATORVASTATIN CALCIUM 80 MG PO TABS
80.0000 mg | ORAL_TABLET | Freq: Every day | ORAL | Status: DC
  Administered 2024-07-03 – 2024-07-05 (×3): 80 mg via ORAL
  Filled 2024-07-03 (×3): qty 1

## 2024-07-03 MED ORDER — OXYCODONE HCL 5 MG PO TABS: 5.0000 mg | ORAL_TABLET | Freq: Four times a day (QID) | ORAL | Status: DC | PRN

## 2024-07-03 MED ORDER — MORPHINE SULFATE (PF) 2 MG/ML IV SOLN: 2.0000 mg | Freq: Once | INTRAVENOUS | Status: DC

## 2024-07-03 MED ORDER — FERRIC CITRATE 1 GM 210 MG(FE) PO TABS
420.0000 mg | ORAL_TABLET | Freq: Three times a day (TID) | ORAL | Status: DC
  Administered 2024-07-04 – 2024-07-05 (×4): 420 mg via ORAL
  Filled 2024-07-03 (×5): qty 2

## 2024-07-03 MED ORDER — ACETAMINOPHEN 650 MG RE SUPP: 650.0000 mg | Freq: Four times a day (QID) | RECTAL | Status: DC | PRN

## 2024-07-03 MED ORDER — ACETAMINOPHEN 325 MG PO TABS: 650.0000 mg | ORAL_TABLET | Freq: Four times a day (QID) | ORAL | Status: DC | PRN

## 2024-07-03 MED ORDER — PANTOPRAZOLE SODIUM 40 MG IV SOLR
40.0000 mg | Freq: Two times a day (BID) | INTRAVENOUS | Status: DC
  Administered 2024-07-03 – 2024-07-05 (×5): 40 mg via INTRAVENOUS
  Filled 2024-07-03 (×5): qty 10

## 2024-07-03 MED ORDER — HEPARIN SODIUM (PORCINE) 5000 UNIT/ML IJ SOLN: 5000.0000 [IU] | Freq: Three times a day (TID) | INTRAMUSCULAR | Status: DC

## 2024-07-03 MED ORDER — PREDNISOLONE ACETATE 1 % OP SUSP
1.0000 [drp] | OPHTHALMIC | Status: DC
  Administered 2024-07-04 – 2024-07-05 (×6): 1 [drp] via OPHTHALMIC
  Filled 2024-07-03: qty 5

## 2024-07-03 MED ORDER — CALCIUM ACETATE (PHOS BINDER) 667 MG PO CAPS
2001.0000 mg | ORAL_CAPSULE | Freq: Three times a day (TID) | ORAL | Status: DC
  Administered 2024-07-03: 2001 mg via ORAL
  Filled 2024-07-03: qty 3

## 2024-07-03 MED ORDER — ATROPINE SULFATE 1 % OP SOLN
1.0000 [drp] | Freq: Every day | OPHTHALMIC | Status: DC
  Administered 2024-07-04 – 2024-07-05 (×2): 1 [drp] via OPHTHALMIC
  Filled 2024-07-03: qty 2

## 2024-07-03 MED ORDER — CALCITRIOL 0.5 MCG PO CAPS
1.7500 ug | ORAL_CAPSULE | ORAL | Status: DC
  Administered 2024-07-04: 1.75 ug via ORAL
  Filled 2024-07-03: qty 1

## 2024-07-03 MED ORDER — FUROSEMIDE 10 MG/ML IJ SOLN
4.0000 mg/h | INTRAVENOUS | Status: DC
  Administered 2024-07-03 – 2024-07-04 (×2): 4 mg/h via INTRAVENOUS
  Filled 2024-07-03: qty 20

## 2024-07-03 MED ORDER — INSULIN ASPART 100 UNIT/ML IJ SOLN
0.0000 [IU] | Freq: Three times a day (TID) | INTRAMUSCULAR | Status: DC
  Administered 2024-07-04: 1 [IU] via SUBCUTANEOUS
  Administered 2024-07-04: 2 [IU] via SUBCUTANEOUS
  Administered 2024-07-04 – 2024-07-05 (×2): 1 [IU] via SUBCUTANEOUS
  Filled 2024-07-03: qty 2
  Filled 2024-07-03 (×3): qty 1

## 2024-07-03 MED ORDER — DORZOLAMIDE HCL-TIMOLOL MAL 2-0.5 % OP SOLN
1.0000 [drp] | Freq: Two times a day (BID) | OPHTHALMIC | Status: DC
  Administered 2024-07-04 – 2024-07-05 (×3): 1 [drp] via OPHTHALMIC
  Filled 2024-07-03: qty 10

## 2024-07-03 MED ORDER — CARVEDILOL 25 MG PO TABS
25.0000 mg | ORAL_TABLET | Freq: Every morning | ORAL | Status: DC
  Administered 2024-07-03 – 2024-07-05 (×3): 25 mg via ORAL
  Filled 2024-07-03 (×2): qty 2
  Filled 2024-07-03: qty 1

## 2024-07-03 MED ORDER — CHLORHEXIDINE GLUCONATE CLOTH 2 % EX PADS
6.0000 | MEDICATED_PAD | Freq: Every day | CUTANEOUS | Status: DC
  Administered 2024-07-05: 6 via TOPICAL

## 2024-07-03 MED ORDER — VITAMIN D 25 MCG (1000 UNIT) PO TABS
2000.0000 [IU] | ORAL_TABLET | Freq: Every day | ORAL | Status: DC
  Administered 2024-07-03 – 2024-07-05 (×3): 2000 [IU] via ORAL
  Filled 2024-07-03 (×3): qty 2

## 2024-07-03 MED ORDER — IRBESARTAN 75 MG PO TABS
75.0000 mg | ORAL_TABLET | Freq: Every day | ORAL | Status: DC
  Administered 2024-07-04 – 2024-07-05 (×2): 75 mg via ORAL
  Filled 2024-07-03 (×2): qty 1

## 2024-07-03 MED ORDER — PERFLUTREN LIPID MICROSPHERE
1.0000 mL | INTRAVENOUS | Status: AC | PRN
  Administered 2024-07-03: 3 mL via INTRAVENOUS

## 2024-07-03 MED ORDER — SODIUM CHLORIDE 0.9% FLUSH
3.0000 mL | Freq: Two times a day (BID) | INTRAVENOUS | Status: DC
  Administered 2024-07-03 – 2024-07-05 (×4): 3 mL via INTRAVENOUS

## 2024-07-03 MED ORDER — RENA-VITE PO TABS
1.0000 | ORAL_TABLET | Freq: Every day | ORAL | Status: DC
  Administered 2024-07-04 – 2024-07-05 (×2): 1 via ORAL
  Filled 2024-07-03 (×3): qty 1

## 2024-07-03 NOTE — ED Notes (Signed)
 Cardiology paged.

## 2024-07-03 NOTE — ED Notes (Signed)
 CCMD called.

## 2024-07-03 NOTE — ED Notes (Signed)
 Extra dark green in mini lab

## 2024-07-03 NOTE — ED Notes (Signed)
 Patient mother Corey Park is wanting an update if possible number is [phone]

## 2024-07-03 NOTE — ED Notes (Signed)
 Light blue sent to lab.

## 2024-07-03 NOTE — Consult Note (Signed)
 "  Cardiology Consultation   Patient ID: Corey Park MRN: [id number]; DOB: 06-10-1971  Admit date: 07/03/2024 Date of Consult: 07/03/2024  PCP:  Jaycee Greig PARAS, NP   [facility] Providers Cardiologist:  Oneil Parchment, MD   Patient Profile: Corey Park is a 54 y.o. male with a hx of CAD status post CABG x 3 06/2023, ESRD on HD, heart failure with mildly reduced EF, hypertension, hyperlipidemia, type 2 diabetes, GERD, chronic anemia, blind who is being seen 07/03/2024 for the evaluation of chest pain at the request of ED.  History of Present Illness: Mr. Woodberry has had previous cardiac catheterization 11/2022 demonstrating moderate nonobstructive CAD.  Had progression of his CAD and admitted for NSTEMI during 05/2024 admission and eventually underwent CABG x 3.  EF 40 to 45% at that time.  After his CABG she was seen again 01/2024 for atypical chest pain in the setting of hypertensive urgency.  Troponins chronically elevated 115-100.  Pain felt to be pleuritic and noncardiac.  An echocardiogram was ordered to evaluate for pericardial effusion/pericarditis.  Echocardiogram had demonstrated improved EF 45 to 50% with no acute findings.    Of note EF has fluctuated significantly over the years with cardiomyopathy dated back since at least 2021.  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  Also has not been seen in follow-up in the outpatient setting for years now.  Patient returns for complaints of chest pain multiple and other symptoms of rectal bleeding, headache, fatigue.  Troponins 194-196.  Chest x-ray with possible pleural effusions.  Patient reports that since his last hospitalization 01/2024 he has not had any chest pain but he woke up to chest pain this morning that he describes as somebody putting a foot in his chest.  Reports 10 out of 10 chest pain that has  been persistent since approximately 2 AM.  Had no exertional symptoms prior to this.  Describes radiating pain to his back.  Pain can be worsened with coughing.  Pain is reproducible with palpation.  He describes burning pain as well.  Otherwise reports multiple other complaints of rectal bleeding, headache, fatigue as above.  Reports that he lives alone and his mother and son will come by to help assist him.  Says he still working for a [employer].  Compliant with all of his medications.    Past Medical History:  Diagnosis Date   Acute blood loss anemia 04/17/2022   Acute encephalopathy 08/11/2023   Acute hypoxic respiratory failure (HCC) 04/02/2023   Adult general medical exam 07/11/2022   Allergy, unspecified, initial encounter 08/25/2019   Asthma    as a child   Cellulitis, perineum 11/26/2019   CHF (congestive heart failure) (HCC) 2021   Complication of vascular dialysis catheter 08/25/2019   COVID-19 virus infection 07/27/2019   Diabetes mellitus without complication (HCC)    type 2   Dilated cardiomyopathy (HCC) 07/03/2018   Elevated troponin    ESRD on hemodialysis (HCC) 06/17/2018   MWF at [facility]   Fe deficiency anemia 12/23/2018   Hypertension    Legally blind    B/L   NSTEMI (non-ST elevated myocardial infarction) (HCC) 06/25/2023   Pneumonia 2021   Sepsis (HCC) 04/17/2022   Type 2 diabetes mellitus with diabetic peripheral angiopathy without gangrene (HCC) 08/25/2019   Unspecified protein-calorie malnutrition 08/25/2019    Past Surgical History:  Procedure  Laterality Date   A/V FISTULAGRAM N/A 03/17/2024   Procedure: A/V Fistulagram;  Surgeon: Magda Debby SAILOR, MD;  Location: [facility];  Service: Cardiovascular;  Laterality: N/A;   BASCILIC VEIN TRANSPOSITION Right 10/16/2019   Procedure: Basilic Vein Transposition Right Arm;  Surgeon: Serene Gaile ORN, MD;  Location: [facility] OR;  Service: Vascular;  Laterality: Right;   BASCILIC VEIN TRANSPOSITION Right  01/29/2020   Procedure: RIGHT ARM SECOND STAGE BASCILIC VEIN TRANSPOSITION;  Surgeon: Serene Gaile ORN, MD;  Location: [facility] OR;  Service: Vascular;  Laterality: Right;   BIOPSY  12/22/2018   Procedure: BIOPSY;  Surgeon: Celestia Agent, MD;  Location: [facility] ENDOSCOPY;  Service: Endoscopy;;   CORONARY ARTERY BYPASS GRAFT N/A 06/28/2023   Procedure: CORONARY ARTERY BYPASS GRAFTING TIMES THREE USING LEFT INTERNAL MAMMARY ARTERY AND RIGHT GREAT SAPHENOUS VEIN HARVESTED ENDOSCOPICALLY;  Surgeon: Shyrl Linnie KIDD, MD;  Location: [facility] OR;  Service: Open Heart Surgery;  Laterality: N/A;   CORONARY ULTRASOUND/IVUS N/A 12/13/2022   Procedure: Coronary Ultrasound/IVUS;  Surgeon: Dann Candyce RAMAN, MD;  Location: [facility] INVASIVE CV LAB;  Service: Cardiovascular;  Laterality: N/A;   ESOPHAGOGASTRODUODENOSCOPY N/A 12/22/2018   Procedure: ESOPHAGOGASTRODUODENOSCOPY (EGD);  Surgeon: Celestia Agent, MD;  Location: [facility] ENDOSCOPY;  Service: Endoscopy;  Laterality: N/A;   IR FLUORO GUIDE CV LINE RIGHT  07/29/2019   IR US  GUIDE VASC ACCESS RIGHT  07/29/2019   LEFT HEART CATH AND CORONARY ANGIOGRAPHY N/A 12/13/2022   Procedure: LEFT HEART CATH AND CORONARY ANGIOGRAPHY;  Surgeon: Dann Candyce RAMAN, MD;  Location: [facility] INVASIVE CV LAB;  Service: Cardiovascular;  Laterality: N/A;   LEFT HEART CATH AND CORONARY ANGIOGRAPHY N/A 06/25/2023   Procedure: LEFT HEART CATH AND CORONARY ANGIOGRAPHY;  Surgeon: Verlin Lonni BIRCH, MD;  Location: [facility] INVASIVE CV LAB;  Service: Cardiovascular;  Laterality: N/A;   TEE WITHOUT CARDIOVERSION N/A 06/28/2023   Procedure: TRANSESOPHAGEAL ECHOCARDIOGRAM (TEE);  Surgeon: Shyrl Linnie KIDD, MD;  Location: [facility] OR;  Service: Open Heart Surgery;  Laterality: N/A;   TRANSESOPHAGEAL ECHOCARDIOGRAM (CATH LAB) N/A 05/10/2023   Procedure: TRANSESOPHAGEAL ECHOCARDIOGRAM;  Surgeon: Barbaraann Darryle Debby, MD;  Location: [facility] INVASIVE CV LAB;  Service: Cardiovascular;  Laterality: N/A;     Scheduled  Meds:   morphine  injection  2 mg Intravenous Once   Continuous Infusions:  PRN Meds:   Allergies:   Allergies[1]  Social History:   Social History   Socioeconomic History   Marital status: Single    Spouse name: Not on file   Number of children: Not on file   Years of education: Not on file   Highest education level: Not on file  Occupational History   Not on file  Tobacco Use   Smoking status: Never   Smokeless tobacco: Never  Vaping Use   Vaping status: Never Used  Substance and Sexual Activity   Alcohol use: Not Currently   Drug use: Not Currently    Types: Marijuana   Sexual activity: Not on file  Other Topics Concern   Not on file  Social History Narrative   Not on file   Social Drivers of Health   Tobacco Use: Low Risk (03/10/2024)   Patient History    Smoking Tobacco Use: Never    Smokeless Tobacco Use: Never    Passive Exposure: Not on file  Financial Resource Strain: Low Risk (11/01/2023)   Overall Financial Resource Strain (CARDIA)    Difficulty of Paying Living Expenses: Not very hard  Recent Concern: Financial Resource Strain - High Risk (  08/23/2023)   Overall Financial Resource Strain (CARDIA)    Difficulty of Paying Living Expenses: Hard  Food Insecurity: No Food Insecurity (02/11/2024)   Epic    Worried About Programme Researcher, Broadcasting/film/video in the Last Year: Never true    Ran Out of Food in the Last Year: Never true  Transportation Needs: No Transportation Needs (02/11/2024)   Epic    Lack of Transportation (Medical): No    Lack of Transportation (Non-Medical): No  Physical Activity: Insufficiently Active (11/01/2023)   Exercise Vital Sign    Days of Exercise per Week: 7 days    Minutes of Exercise per Session: 20 min  Stress: No Stress Concern Present (11/01/2023)   [employer] of Occupational Health - Occupational Stress Questionnaire    Feeling of Stress : Not at all  Social Connections: Moderately Integrated (11/01/2023)   Social Connection and  Isolation Panel    Frequency of Communication with Friends and Family: More than three times a week    Frequency of Social Gatherings with Friends and Family: More than three times a week    Attends Religious Services: More than 4 times per year    Active Member of Clubs or Organizations: Yes    Attends Banker Meetings: More than 4 times per year    Marital Status: Never married  Intimate Partner Violence: Not At Risk (02/11/2024)   Epic    Fear of Current or Ex-Partner: No    Emotionally Abused: No    Physically Abused: No    Sexually Abused: No  Depression (PHQ2-9): Low Risk (11/01/2023)   Depression (PHQ2-9)    PHQ-2 Score: 2  Alcohol Screen: Low Risk (11/01/2023)   Alcohol Screen    Last Alcohol Screening Score (AUDIT): 0  Housing: Low Risk (02/11/2024)   Epic    Unable to Pay for Housing in the Last Year: No    Number of Times Moved in the Last Year: 0    Homeless in the Last Year: No  Utilities: Not At Risk (02/11/2024)   Epic    Threatened with loss of utilities: No  Health Literacy: Inadequate Health Literacy (08/23/2023)   B1300 Health Literacy    Frequency of need for help with medical instructions: Sometimes    Family History:   Family History  Problem Relation Age of Onset   CAD Mother    Hypertension Mother    Diabetes Neg Hx    Stroke Neg Hx    Cancer Neg Hx    Kidney failure Neg Hx    Stomach cancer Neg Hx    Colon cancer Neg Hx    Rectal cancer Neg Hx      ROS:  Please see the history of present illness.  All other ROS reviewed and negative.     Physical Exam/Data: Vitals:   07/03/24 0741 07/03/24 1215  BP: 128/87 (!) 163/89  Pulse: 82 81  Resp: 16 18  Temp: 98.1 F (36.7 C) 98.3 F (36.8 C)  TempSrc: Oral Oral  SpO2: 99% 100%   No intake or output data in the 24 hours ending 07/03/24 1315    02/13/2024   12:57 PM 02/13/2024   12:55 PM 02/13/2024    8:50 AM  Last 3 Weights  Weight (lbs) 227 lb 8.2 oz 227 lb 8.2 oz 232 lb 9.4 oz   Weight (kg) 103.2 kg 103.2 kg 105.5 kg     There is no height or weight on file to calculate BMI.  General:  Well nourished, well developed, in no acute distress HEENT: normal Neck: no JVD Vascular: No carotid bruits; Distal pulses 2+ bilaterally Cardiac:  normal S1, S2; RRR; no murmur. Lungs:  clear to auscultation bilaterally, no wheezing, rhonchi or rales  Abd: soft, nontender, no hepatomegaly  Ext: no edema Musculoskeletal:  No deformities, BUE and BLE strength normal and equal Skin: warm and dry  Neuro:  CNs 2-12 intact, no focal abnormalities noted Psych:  Normal affect   EKG:  The EKG was personally reviewed and demonstrates: Sinus rhythm, heart rate 84.  ST depression in lateral leads.  LVH noted. Telemetry:  Telemetry was personally reviewed and demonstrates: Sinus heart rate 70-80  Relevant CV Studies: Echocardiogram 02/12/2024  1. Left ventricular ejection fraction, by estimation, is 45 to 50%. The  left ventricle has mildly decreased function. The left ventricle  demonstrates regional wall motion abnormalities (see scoring  diagram/findings for description). Left ventricular  diastolic parameters are consistent with Grade II diastolic dysfunction  (pseudonormalization). Elevated left atrial pressure. There is mild  hypokinesis of the left ventricular, basal-mid inferolateral wall.   2. Right ventricular systolic function is mildly reduced. The right  ventricular size is normal. Tricuspid regurgitation signal is inadequate  for assessing PA pressure.   3. Left atrial size was mildly dilated.   4. The mitral valve is normal in structure. No evidence of mitral valve  regurgitation.   5. The aortic valve is tricuspid. Aortic valve regurgitation is not  visualized. Aortic valve sclerosis/calcification is present, without any  evidence of aortic stenosis.   6. Aortic dilatation noted. There is borderline dilatation of the aortic  root, measuring 39 mm.   Comparison(s):  Prior images reviewed side by side. The left ventricular  function has improved. The left ventricular wall motion improved.   Left heart catheterization 06/25/2023 Prox RCA lesion is 80% stenosed.   Ost Cx to Mid Cx lesion is 99% stenosed.   Prox LAD lesion is 95% stenosed.   Mid LAD lesion is 50% stenosed.   Mid LM to Ost LAD lesion is 50% stenosed.   Severe three vessel CAD Moderate distal left main stenosis Severe, heavily calcified proximal LAD stenosis Severe stenosis (likely ulcerated plaque) in the ostial/proximal Circumflex artery Severe stenosis in the proximal RCA (Moderate caliber co-dominant vessel) Elevated LV filling pressures   Recommendations: I would recommend surgical revascularization with CABG in this diabetic male with multi-vessel CAD, ESRD on HD. Will consult CT surgery. Resume IV heparin  2 hours post sheath removal.     Laboratory Data: High Sensitivity Troponin:  No results for input(s): TROPONINIHS in the last 720 hours.  Recent Labs  Lab 07/03/24 0739 07/03/24 0937  TRNPT 194* 196*      Chemistry Recent Labs  Lab 07/03/24 0739  NA 136  K 4.6  CL 94*  CO2 29  GLUCOSE 146*  BUN 34*  CREATININE 8.78*  CALCIUM  9.9  GFRNONAA 7*  ANIONGAP 12    No results for input(s): PROT, ALBUMIN , AST, ALT, ALKPHOS, BILITOT in the last 168 hours. Lipids No results for input(s): CHOL, TRIG, HDL, LABVLDL, LDLCALC, CHOLHDL in the last 168 hours.  Hematology Recent Labs  Lab 07/03/24 0739  WBC 7.5  RBC 3.39*  HGB 11.1*  HCT 33.9*  MCV 100.0  MCH 32.7  MCHC 32.7  RDW 16.7*  PLT 193   Thyroid No results for input(s): TSH, FREET4 in the last 168 hours.  BNPNo results for input(s): BNP, PROBNP in the  last 168 hours.  DDimer No results for input(s): DDIMER in the last 168 hours.  Radiology/Studies:  DG Chest 2 View Result Date: 07/03/2024 EXAM: 2 VIEW(S) XRAY OF THE CHEST 07/03/2024 08:04:00 AM COMPARISON: 03/10/2024  CLINICAL HISTORY: CP FINDINGS: LUNGS AND PLEURA: No focal pulmonary opacity. Blunting of bilateral costophrenic angles with possible trace pleural effusions. No pneumothorax. HEART AND MEDIASTINUM: Enlarged cardiomediastinal silhouette, unchanged. Atherosclerotic plaque. Post-CABG (Coronary Artery Bypass Graft) changes. BONES AND SOFT TISSUES: Intact sternotomy wires. IMPRESSION: 1. Blunting of the bilateral costophrenic angles with possible trace pleural effusions. Electronically signed by: Morgane Naveau MD MD 07/03/2024 08:09 AM EST RP Workstation: [id number]     Assessment and Plan:  Chest pain CAD -06/2023 NSTEMI status post CABG x 3. Patient is reporting acute onset of 10 out of 10 chest pain that woke him up from his sleep that started at 2 AM.  He reports persistent pain since this time. Troponin is 194-196 without significant delta and overall flat despite 10 out of 10 pain that he reports is still going on.  Pain is reproducible to palpation.  Pain is a burning sensation.  His EKG chronically has ischemic appearance but overall looks unchanged on serial measurements.  Additionally chronically has elevated troponins likely secondary to ESRD. Continue with Plavix  75 mg, atorvastatin  80 mg, carvedilol  25 mg twice daily Get limited echo to evaluate for wall motion abnormalities, of note chronically has some degree. Plan is for outpatient Myoview  at discharge.  Outpatient study likely a better quality.  Heart failure with mildly reduced EF -chronic history of cardiomyopathy since 2021. -01/2024 EF improved 45 to 50%, G2 DD.  Mildly reduced RV.  No significant valve disease. NYHA class II.  Appears euvolemic on exam. Chronically has been on carvedilol  25 mg twice daily, and irbesartan  75 mg daily.  Defer to nephrology about ARB. Will titrate GDMT more after echocardiogram and more blood pressure readings.  Consider hydralazine  and Isordil  Volume status managed by ESRD. Echo as above.  ESRD  on HD   Risk Assessment/Risk Scores:   [place]  Heart Association (NYHA) Functional Class NYHA Class II       For questions or updates, please contact [facility] Please consult [email or web address] for contact info under      Signed, Thom LITTIE Sluder, PA-C  07/03/2024 1:15 PM      [1]  Allergies Allergen Reactions   Gabapentin  Other (See Comments)    Hallucinations    Oxycodone  Other (See Comments)    Hallucinations    Imdur  [Isosorbide  Nitrate] Other (See Comments)    Endorsed headache in the past - willing to retry   "

## 2024-07-03 NOTE — H&P (Signed)
 " History and Physical    PatientAshante Park FMW:[id number] DOB: 07/06/1970 DOA: 07/03/2024 DOS: the patient was seen and examined on 07/03/2024 . PCP: Jaycee Greig PARAS, NP  Patient coming from: Home Chief complaint: Chief Complaint  Patient presents with   Chest Pain   HPI:  Corey Park is a 54 y.o. male with past medical history  of  hypertension, hyperlipidemia, diabetes, GERD, CAD status post CABG earlier this year, anemia, chronic combined systolic and diastolic CHF, bilateral blindness, ESRD on HD, obesity presenting again with chest pain  similar to his previous admission in August 2025 coming today with left sided chest pain and rectal bleeding that started today. PT's last egd / colonoscopy was in 2022 with [facility]. Per ED attending patient had multiple complaints including left-sided chest pain blindness from his glaucoma, sticky stools thinking his blood. Pt is a limited historian otherwise.   ED Course:  Vital signs in the ED were notable for the following:  Vitals:   07/03/24 1215 07/03/24 1511 07/03/24 1722 07/03/24 1900  BP: (!) 163/89 (!) 165/89 (!) 161/137 128/76  Pulse: 81 78 90 93  Temp: 98.3 F (36.8 C) 98.1 F (36.7 C) 98.1 F (36.7 C)   Resp: 18 20 16 19   SpO2: 100% 100% 100% 100%  TempSrc: Oral Oral Oral    >>ED evaluation thus far shows: -BMP shows glucose of 146 BUN of 34, creatinine of 8.78 egfr of 7. Overall comparison though his metabolic panel is stable, except for his creatinine since he is HD patient.  -CBC shows wbc of 7.5 and hb of 11.1 plt of 193.CBC is currently stable and fluctuating combination of chronic gi loss/ anemic of CKD and volume related whether he is pre or post HD session. -FOBT is negative.  -Chest xray shows blunting costophrenic angles with ? trace pleura effusion. - EKG shows sinus rhythm at 84 PR 188 QTc 5 5, LVH has QRS widening probably LVH related conduction delay patient also has ST depression with T wave  abnormalities in lateral leads, EKG similar to March 10, 2024 EKG. TNI of 194 and repeat flat at 196.    >>While in the ED patient received the following: Medications  morphine  (PF) 2 MG/ML injection 2 mg (has no administration in time range)   Review of Systems  Eyes:        Patient is blind from a glaucoma,  Is being followed by ophthalmology.  Cardiovascular:  Positive for chest pain and leg swelling.  Gastrointestinal:  Positive for blood in stool.   Past Medical History:  Diagnosis Date   Acute blood loss anemia 04/17/2022   Acute encephalopathy 08/11/2023   Acute hypoxic respiratory failure (HCC) 04/02/2023   Adult general medical exam 07/11/2022   Allergy, unspecified, initial encounter 08/25/2019   Asthma    as a child   Cellulitis, perineum 11/26/2019   CHF (congestive heart failure) (HCC) 2021   Complication of vascular dialysis catheter 08/25/2019   COVID-19 virus infection 07/27/2019   Diabetes mellitus without complication (HCC)    type 2   Dilated cardiomyopathy (HCC) 07/03/2018   Elevated troponin    ESRD on hemodialysis (HCC) 06/17/2018   MWF at [facility]   Fe deficiency anemia 12/23/2018   Hypertension    Legally blind    B/L   NSTEMI (non-ST elevated myocardial infarction) (HCC) 06/25/2023   Pneumonia 2021   Sepsis (HCC) 04/17/2022   Type 2 diabetes mellitus with diabetic peripheral angiopathy without gangrene (HCC)  08/25/2019   Unspecified protein-calorie malnutrition 08/25/2019   Past Surgical History:  Procedure Laterality Date   A/V FISTULAGRAM N/A 03/17/2024   Procedure: A/V Fistulagram;  Surgeon: Magda Debby SAILOR, MD;  Location: [facility];  Service: Cardiovascular;  Laterality: N/A;   BASCILIC VEIN TRANSPOSITION Right 10/16/2019   Procedure: Basilic Vein Transposition Right Arm;  Surgeon: Serene Gaile ORN, MD;  Location: [facility] OR;  Service: Vascular;  Laterality: Right;   BASCILIC VEIN TRANSPOSITION Right 01/29/2020   Procedure: RIGHT ARM SECOND  STAGE BASCILIC VEIN TRANSPOSITION;  Surgeon: Serene Gaile ORN, MD;  Location: [facility] OR;  Service: Vascular;  Laterality: Right;   BIOPSY  12/22/2018   Procedure: BIOPSY;  Surgeon: Celestia Agent, MD;  Location: [facility] ENDOSCOPY;  Service: Endoscopy;;   CORONARY ARTERY BYPASS GRAFT N/A 06/28/2023   Procedure: CORONARY ARTERY BYPASS GRAFTING TIMES THREE USING LEFT INTERNAL MAMMARY ARTERY AND RIGHT GREAT SAPHENOUS VEIN HARVESTED ENDOSCOPICALLY;  Surgeon: Shyrl Linnie KIDD, MD;  Location: [facility] OR;  Service: Open Heart Surgery;  Laterality: N/A;   CORONARY ULTRASOUND/IVUS N/A 12/13/2022   Procedure: Coronary Ultrasound/IVUS;  Surgeon: Dann Candyce RAMAN, MD;  Location: [facility] INVASIVE CV LAB;  Service: Cardiovascular;  Laterality: N/A;   ESOPHAGOGASTRODUODENOSCOPY N/A 12/22/2018   Procedure: ESOPHAGOGASTRODUODENOSCOPY (EGD);  Surgeon: Celestia Agent, MD;  Location: [facility] ENDOSCOPY;  Service: Endoscopy;  Laterality: N/A;   IR FLUORO GUIDE CV LINE RIGHT  07/29/2019   IR US  GUIDE VASC ACCESS RIGHT  07/29/2019   LEFT HEART CATH AND CORONARY ANGIOGRAPHY N/A 12/13/2022   Procedure: LEFT HEART CATH AND CORONARY ANGIOGRAPHY;  Surgeon: Dann Candyce RAMAN, MD;  Location: [facility] INVASIVE CV LAB;  Service: Cardiovascular;  Laterality: N/A;   LEFT HEART CATH AND CORONARY ANGIOGRAPHY N/A 06/25/2023   Procedure: LEFT HEART CATH AND CORONARY ANGIOGRAPHY;  Surgeon: Verlin Lonni BIRCH, MD;  Location: [facility] INVASIVE CV LAB;  Service: Cardiovascular;  Laterality: N/A;   TEE WITHOUT CARDIOVERSION N/A 06/28/2023   Procedure: TRANSESOPHAGEAL ECHOCARDIOGRAM (TEE);  Surgeon: Shyrl Linnie KIDD, MD;  Location: [facility] OR;  Service: Open Heart Surgery;  Laterality: N/A;   TRANSESOPHAGEAL ECHOCARDIOGRAM (CATH LAB) N/A 05/10/2023   Procedure: TRANSESOPHAGEAL ECHOCARDIOGRAM;  Surgeon: Barbaraann Darryle Debby, MD;  Location: [facility] INVASIVE CV LAB;  Service: Cardiovascular;  Laterality: N/A;    reports that he has never smoked. He has never used  smokeless tobacco. He reports that he does not currently use alcohol. He reports that he does not currently use drugs after having used the following drugs: Marijuana. Allergies[1] Family History  Problem Relation Age of Onset   CAD Mother    Hypertension Mother    Diabetes Neg Hx    Stroke Neg Hx    Cancer Neg Hx    Kidney failure Neg Hx    Stomach cancer Neg Hx    Colon cancer Neg Hx    Rectal cancer Neg Hx    Prior to Admission medications  Medication Sig Start Date End Date Taking? Authorizing Provider  atorvastatin  (LIPITOR ) 80 MG tablet Take 1 tablet (80 mg total) by mouth daily. 08/13/23   Samtani, Jai-Gurmukh, MD  atropine  1 % ophthalmic solution Place 1 drop into the left eye daily. 02/13/24   Christobal Guadalajara, MD  latanoprost  (XALATAN ) 0.005 % ophthalmic solution Place 1 drop into the left eye daily. 02/13/24   Christobal Guadalajara, MD  calcium  acetate (PHOSLO ) 667 MG capsule Take 3 capsules (2,001 mg total) by mouth with breakfast, with lunch, and with evening meal. 07/08/23   Gold, Lemond BRAVO,  PA-C  carvedilol  (COREG ) 25 MG tablet Take 25 mg by mouth every morning.    [provider]  Cholecalciferol  (VITAMIN D3) 50 MCG (2000 UT) capsule Take 2,000 Units by mouth daily. 01/02/24   [provider]  dorzolamide -timolol  (COSOPT ) 2-0.5 % ophthalmic solution Place 1 drop into the left eye 2 (two) times daily. 02/13/24   Christobal Guadalajara, MD  erythromycin  ophthalmic ointment Place a 1/2 inch ribbon of ointment into the lower eyelid. 03/10/24   Ula Prentice SAUNDERS, MD  famotidine  (PEPCID ) 20 MG tablet Take 20 mg by mouth daily. 04/20/23   [provider]  ferric citrate  (AURYXIA ) 1 GM 210 MG(Fe) tablet Take 2 tablets (420 mg total) by mouth daily. Patient taking differently: Take 420 mg by mouth 3 (three) times daily with meals. 12/14/22   Tobie Gaines, DO  irbesartan  (AVAPRO ) 75 MG tablet Take 1 tablet (75 mg total) by mouth daily. 02/13/24 03/14/24  Christobal Guadalajara, MD  lactulose  (CHRONULAC ) 10 GM/15ML  solution Take 15 mLs (10 g total) by mouth daily as needed for mild constipation or moderate constipation. 03/10/24   Ula Prentice SAUNDERS, MD  multivitamin (RENA-VIT) TABS tablet Take 1 tablet by mouth daily. 11/22/22   [provider]  oxyCODONE  (ROXICODONE ) 5 MG immediate release tablet Take 1 tablet (5 mg total) by mouth every 6 (six) hours as needed for severe pain (pain score 7-10). 03/10/24   Ula Prentice SAUNDERS, MD  pantoprazole  (PROTONIX ) 20 MG tablet Take 20 mg by mouth daily as needed for heartburn. 04/19/23   [provider]  prednisoLONE  acetate (PRED FORTE ) 1 % ophthalmic suspension Place 1 drop into the left eye every 4 (four) hours. 02/13/24   Christobal Guadalajara, MD  Vitamin D , Ergocalciferol , (DRISDOL ) 1.25 MG (50000 UNIT) CAPS capsule Take 1 capsule (50,000 Units total) by mouth every 7 (seven) days. Patient taking differently: Take 50,000 Units by mouth every Monday. 08/19/23   Royal Sill, MD                                                                                 Vitals:   07/03/24 1215 07/03/24 1511 07/03/24 1722 07/03/24 1900  BP: (!) 163/89 (!) 165/89 (!) 161/137 128/76  Pulse: 81 78 90 93  Resp: 18 20 16 19   Temp: 98.3 F (36.8 C) 98.1 F (36.7 C) 98.1 F (36.7 C)   TempSrc: Oral Oral Oral   SpO2: 100% 100% 100% 100%   Physical Exam Constitutional:      Appearance: He is obese. He is not ill-appearing.  Cardiovascular:     Rate and Rhythm: Normal rate and regular rhythm.     Pulses: Normal pulses.     Heart sounds: Normal heart sounds.  Pulmonary:     Effort: Pulmonary effort is normal.     Breath sounds: Normal breath sounds.  Abdominal:     General: Bowel sounds are normal.     Palpations: Abdomen is soft.  Musculoskeletal:     Right lower leg: Edema present.     Left lower leg: Edema present.  Neurological:     General: No focal deficit present.     Mental Status: He is alert  and oriented to person, place, and time.     Motor: Motor function  is intact.  Psychiatric:        Attention and Perception: He is inattentive.        Speech: Speech normal.     Labs on Admission: I have personally reviewed following labs and imaging studies CBC: Recent Labs  Lab 07/03/24 0739  WBC 7.5  HGB 11.1*  HCT 33.9*  MCV 100.0  PLT 193   Basic Metabolic Panel: Recent Labs  Lab 07/03/24 0739  NA 136  K 4.6  CL 94*  CO2 29  GLUCOSE 146*  BUN 34*  CREATININE 8.78*  CALCIUM  9.9   GFR: CrCl cannot be calculated (Unknown ideal weight.). Liver Function Tests: No results for input(s): AST, ALT, ALKPHOS, BILITOT, PROT, ALBUMIN  in the last 168 hours. No results for input(s): LIPASE, AMYLASE in the last 168 hours. No results for input(s): AMMONIA in the last 168 hours. Recent Labs    08/12/23 0748 08/13/23 0638 08/17/23 1558 09/01/23 1122 09/05/23 0800 11/20/23 1625 02/11/24 0619 02/12/24 0222 03/10/24 0739 07/03/24 0739  BUN 39* 45* 18 28* 58* 80* 75* 51* 92* 34*  CREATININE 11.96* 13.31* 7.59* 7.97* 11.96* 14.95* 17.00* 13.59* 18.28* 8.78*    Cardiac Enzymes: No results for input(s): CKTOTAL, CKMB, CKMBINDEX, TROPONINI in the last 168 hours. BNP (last 3 results) No results for input(s): PROBNP in the last 8760 hours. HbA1C: Recent Labs    07/03/24 1730  HGBA1C 6.7*   CBG: Recent Labs  Lab 07/03/24 1710  GLUCAP 96   Lipid Profile: No results for input(s): CHOL, HDL, LDLCALC, TRIG, CHOLHDL, LDLDIRECT in the last 72 hours. Thyroid Function Tests: Recent Labs    07/03/24 1437  TSH 1.110  FREET4 1.61   Anemia Panel: Recent Labs    07/03/24 1437  VITAMINB12 850  FOLATE >20.0  FERRITIN 1,812*  TIBC 227*  IRON  117  RETICCTPCT 1.9   Urine analysis:    Component Value Date/Time   COLORURINE YELLOW 04/17/2022 1916   APPEARANCEUR CLEAR 04/17/2022 1916   LABSPEC 1.015 04/17/2022 1916   PHURINE 7.0 04/17/2022 1916   GLUCOSEU 50 (A) 04/17/2022 1916   HGBUR SMALL  (A) 04/17/2022 1916   BILIRUBINUR NEGATIVE 04/17/2022 1916   KETONESUR NEGATIVE 04/17/2022 1916   PROTEINUR 100 (A) 04/17/2022 1916   NITRITE NEGATIVE 04/17/2022 1916   LEUKOCYTESUR TRACE (A) 04/17/2022 1916   Radiological Exams on Admission: ECHOCARDIOGRAM LIMITED Result Date: 07/03/2024    ECHOCARDIOGRAM LIMITED REPORT   Patient Name:   JERREMY MAIONE Date of Exam: 07/03/2024 Medical Rec #:  [id number]       Height:       73.0 in Accession #:    [id number]      Weight:       227.5 lb Date of Birth:  1971/05/03       BSA:          2.273 m Patient Age:    53 years        BP:           163/89 mmHg Patient Gender: M               HR:           74 bpm. Exam Location:  Inpatient Procedure: Limited Echo, Cardiac Doppler, Color Doppler and Intracardiac            Opacification Agent (Both Spectral and Color Flow Doppler were  utilized during procedure). Indications:    Chest Pain R07.9  History:        Patient has prior history of Echocardiogram examinations, most                 recent 02/12/2024. CHF, Prior CABG; Risk Factors:Diabetes,                 Dyslipidemia and Hypertension.  Sonographer:    Merlynn Argyle Referring Phys: [id number] SHENG L HALEY IMPRESSIONS  1. Left ventricular ejection fraction, by estimation, is 45 to 50%. The left ventricle has mildly decreased function. The left ventricle demonstrates regional wall motion abnormalities (see scoring diagram/findings for description). There is mild left ventricular hypertrophy. Left ventricular diastolic parameters are consistent with Grade II diastolic dysfunction (pseudonormalization).  2. Right ventricular systolic function is mildly reduced. The right ventricular size is normal.  3. The mitral valve is degenerative. Mild mitral valve regurgitation.  4. Aortic valve regurgitation is trivial. Comparison(s): No significant change from prior study. Prior images reviewed side by side. FINDINGS  Left Ventricle: Left ventricular ejection fraction, by  estimation, is 45 to 50%. The left ventricle has mildly decreased function. The left ventricle demonstrates regional wall motion abnormalities. Definity  contrast agent was given IV to delineate the left ventricular endocardial borders. There is mild left ventricular hypertrophy. Left ventricular diastolic parameters are consistent with Grade II diastolic dysfunction (pseudonormalization).  LV Wall Scoring: The antero-lateral wall and posterior wall are hypokinetic. Right Ventricle: The right ventricular size is normal. Right ventricular systolic function is mildly reduced. Mitral Valve: The mitral valve is degenerative in appearance. Mild to moderate mitral annular calcification. Mild mitral valve regurgitation. Aortic Valve: Aortic valve regurgitation is trivial. Pulmonic Valve: Pulmonic valve regurgitation is mild. Aorta: Ascending aorta measurements are within normal limits for age when indexed to body surface area. LEFT VENTRICLE PLAX 2D LVIDd:         5.50 cm      Diastology LVIDs:         4.40 cm      LV e' medial:    5.03 cm/s LV PW:         1.20 cm      LV E/e' medial:  17.8 LV IVS:        1.10 cm      LV e' lateral:   9.00 cm/s                             LV E/e' lateral: 10.0  LV Volumes (MOD) LV vol d, MOD A2C: 189.0 ml LV vol d, MOD A4C: 180.0 ml LV vol s, MOD A2C: 112.0 ml LV vol s, MOD A4C: 118.0 ml LV SV MOD A2C:     77.0 ml LV SV MOD A4C:     180.0 ml LV SV MOD BP:      70.9 ml RIGHT VENTRICLE RV S prime:     7.38 cm/s TAPSE (M-mode): 1.4 cm                      PULMONIC VALVE AORTA                RVOT Peak grad: 2 mmHg Ao Asc diam: 3.90 cm  MITRAL VALVE MV Area (PHT): 5.50 cm    SHUNTS MV Decel Time: 138 msec    Pulmonic VTI: 0.156 m MV E velocity: 89.60 cm/s MV A velocity: 63.40 cm/s MV  E/A ratio:  1.41 Soyla Merck MD Electronically signed by Soyla Merck MD Signature Date/Time: 07/03/2024/5:30:39 PM    Final    DG Chest 2 View Result Date: 07/03/2024 EXAM: 2 VIEW(S) XRAY OF THE CHEST  07/03/2024 08:04:00 AM COMPARISON: 03/10/2024 CLINICAL HISTORY: CP FINDINGS: LUNGS AND PLEURA: No focal pulmonary opacity. Blunting of bilateral costophrenic angles with possible trace pleural effusions. No pneumothorax. HEART AND MEDIASTINUM: Enlarged cardiomediastinal silhouette, unchanged. Atherosclerotic plaque. Post-CABG (Coronary Artery Bypass Graft) changes. BONES AND SOFT TISSUES: Intact sternotomy wires. IMPRESSION: 1. Blunting of the bilateral costophrenic angles with possible trace pleural effusions. Electronically signed by: Morgane Naveau MD MD 07/03/2024 08:09 AM EST RP Workstation: [id number]   Data Reviewed: Relevant notes from primary care and specialist visits, past discharge summaries as available in EHR, including Care Everywhere . Prior diagnostic testing as pertinent to current admission diagnoses, Updated medications and problem lists for reconciliation .ED course, including vitals, labs, imaging, treatment and response to treatment,Triage notes, nursing and pharmacy notes and ED provider's notes.Notable results as noted in HPI.Discussed case with EDMD/ ED APP/ or Specialty MD on call and as needed.  Assessment & Plan   >> Chest pain-post CABG 1 year: Patient had NSTEMI June 28, 2023 enlarged cardiac three-vessel disease and ultimately underwent CABG. patient's flat troponin trend 194-196 is less likely that this is a cardiac.  In light of his rectal bleeding this also could be upper GI related.  Cardiology has been consulted. Will continue patient on his aspirin  and statin coronary along with IV PPI.   >> End-stage renal disease on hemodialysis Monday Wednesday and Friday: Last dialysis yesterday thru right arm brachiobasilic AV fistula.  Will continue with Monday Wednesday Friday schedule Nephrology consulted for HD.     >> Legally blind: In both eyes from his glaucoma, Continue current ophthalmology regimen , latanoprost , dorzolamide -timolol , prednisolone , atropine ,  erythromycin  ointment. Last eye exam was 4 months ago and has yet to schedule follow up.    >> Essential hypertension: Continue coreg / avapro .   >> Chronic combined systolic congestive heart failure with grade 2 diastolic dysfunction: Most recent ejection fraction of 45 to 50% on echocardiogram.   >> Diabetes mellitus type 2: Glycemic protocol. No meds currently.     >>Anemia/ Rectal bleeding:    Latest Ref Rng & Units 07/03/2024    7:39 AM 03/10/2024    7:39 AM 02/12/2024    2:22 AM  CBC  WBC 4.0 - 10.5 K/uL 7.5  7.2  6.0   Hemoglobin 13.0 - 17.0 g/dL 88.8  9.9  89.0   Hematocrit 39.0 - 52.0 % 33.9  30.3  31.8   Platelets 150 - 400 K/uL 193  161  174   Type/screen, IV PPI, stool occult.  Have requested GI consult.    DVT prophylaxis:  SCDs.  Consults:  Cardiology. GI.   Advance Care Planning:    Code Status: Full Code   Family Communication:  joleen :[phone]   Disposition Plan:  Home. Severity of Illness: The appropriate patient status for this patient is INPATIENT. Inpatient status is judged to be reasonable and necessary in order to provide the required intensity of service to ensure the patient's safety. The patient's presenting symptoms, physical exam findings, and initial radiographic and laboratory data in the context of their chronic comorbidities is felt to place them at high risk for further clinical deterioration. Furthermore, it is not anticipated that the patient will be medically stable for discharge from the hospital within 2 midnights of  admission.   * I certify that at the point of admission it is my clinical judgment that the patient will require inpatient hospital care spanning beyond 2 midnights from the point of admission due to high intensity of service, high risk for further deterioration and high frequency of surveillance required.*  Unresulted Labs (From admission, onward)     Start     Ordered   07/04/24 0500  Lipid panel  Tomorrow  morning,   R        07/03/24 1348   07/04/24 0500  Comprehensive metabolic panel  Tomorrow morning,   R        07/03/24 1441   07/04/24 0500  CBC  Tomorrow morning,   R        07/03/24 1441   07/04/24 0500  Magnesium   Tomorrow morning,   R        07/03/24 1442   07/04/24 0500  Phosphorus  Tomorrow morning,   R        07/03/24 1442   07/04/24 0500  Occult blood card to lab, stool  Daily,   R      07/03/24 1442            Meds ordered this encounter  Medications   morphine  (PF) 2 MG/ML injection 2 mg   DISCONTD: heparin  injection 5,000 Units   sodium chloride  flush (NS) 0.9 % injection 3 mL   OR Linked Order Group    acetaminophen  (TYLENOL ) tablet 650 mg    acetaminophen  (TYLENOL ) suppository 650 mg   insulin  aspart (novoLOG ) injection 0-6 Units    Correction coverage::   Very Sensitive (ESRD/Dialysis)    CBG < 70::   Implement Hypoglycemia Standing Orders and refer to Hypoglycemia Standing Orders sidebar report    CBG 70 - 120::   0 units    CBG 121 - 150::   0 units    CBG 151 - 200::   1 unit    CBG 201-250::   2 units    CBG 251-300::   3 units    CBG 301-350::   4 units    CBG 351-400::   5 units    CBG > 400:   Give 6 units and call MD   pantoprazole  (PROTONIX ) injection 40 mg   atorvastatin  (LIPITOR ) tablet 80 mg   atropine  1 % ophthalmic solution 1 drop   calcium  acetate (PHOSLO ) capsule 2,001 mg   carvedilol  (COREG ) tablet 25 mg   dorzolamide -timolol  (COSOPT ) 2-0.5 % ophthalmic solution 1 drop   cholecalciferol  (VITAMIN D3) 25 MCG (1000 UNIT) tablet 2,000 Units   ferric citrate  (AURYXIA ) tablet 420 mg   prednisoLONE  acetate (PRED FORTE ) 1 % ophthalmic suspension 1 drop   oxyCODONE  (Oxy IR/ROXICODONE ) immediate release tablet 5 mg    Refill:  0   multivitamin (RENA-VIT) tablet 1 tablet   latanoprost  (XALATAN ) 0.005 % ophthalmic solution 1 drop   irbesartan  (AVAPRO ) tablet 75 mg   perflutren  lipid microspheres (DEFINITY ) IV suspension   furosemide  (LASIX ) 200  mg in dextrose  5 % 100 mL (2 mg/mL) infusion     Orders Placed This Encounter  Procedures   DG Chest 2 View   Basic metabolic panel   CBC   Lipid panel   Hemoglobin A1c   HIV Antibody (routine testing w rflx)   Comprehensive metabolic panel   CBC   Magnesium    Phosphorus   Vitamin B12   Folate   Iron  and TIBC   Ferritin  Reticulocytes   Occult blood card to lab, stool   TSH   T4, free   Diet 2 gram sodium Room service appropriate? Yes with Assist; Fluid consistency: Thin; Fluid restriction: 1200 mL Fluid   Document Height and Actual Weight   Initiate Carrier Fluid Protocol   Apply Diabetes Mellitus Care Plan   STAT CBG when hypoglycemia is suspected. If treated, recheck every 15 minutes after each treatment until CBG >/= 70 mg/dl   Refer to Hypoglycemia Protocol Sidebar Report for treatment of CBG < 70 mg/dl   No HS correction Insulin    Maintain IV access   Vital signs   Notify physician (specify)   Mobility Protocol: No Restrictions   Refer to Sidebar Report Mobility Protocol for Adult Inpatient   Initiate Adult Central Line Maintenance and Catheter Clearance Protocol for patients with central line (CVC, PICC, Port, Hemodialysis, Trialysis)   Daily weights   Intake and Output   Initiate CHG Protocol for patients in ICU/SD or any patient with a central line or foley catheter   Do not place and if present remove PureWick   Initiate Oral Care Protocol   Initiate Carrier Fluid Protocol   RN may order General Admission PRN Orders utilizing General Admission PRN medications (through manage orders) for the following patient needs: allergy symptoms (Claritin), cold sores (Carmex), cough (Robitussin DM), eye irritation (Liquifilm Tears), hemorrhoids (Tucks), indigestion (Maalox), minor skin irritation (Hydrocortisone  Cream), muscle pain (Ben Gay), nose irritation (saline nasal spray) and sore throat (Chloraseptic spray).   Cardiac Monitoring - Continuous Indefinite   Ambulate  with assistance   Place and maintain sequential compression device   Full code   Inpatient consult to Cardiology   Consult to hospitalist   Consult to nephrology   Pulse oximetry check with vital signs   Oxygen therapy Mode or (Route): Nasal cannula; Liters Per Minute: 2; Keep O2 saturation between: greater than 92 %   POC occult blood, ED   CBG monitoring, ED   EKG 12-Lead   EKG   EKG 12-Lead   ECHOCARDIOGRAM LIMITED   Type and screen  [facility]   Admit to Inpatient (patient's expected length of stay will be greater than 2 midnights or inpatient only procedure)   Aspiration precautions   Fall precautions    Author: Mario LULLA Blanch, MD 12 pm- 8 pm. [facility]. 07/03/2024 8:19 PM Please note for any communication after hours contact [facility] Assigned provider on call on Amion.       [1]  Allergies Allergen Reactions   Gabapentin  Other (See Comments)    Hallucinations    Oxycodone  Other (See Comments)    Hallucinations    Imdur  [Isosorbide  Nitrate] Other (See Comments)    Endorsed headache in the past - willing to retry   "

## 2024-07-03 NOTE — Consult Note (Signed)
 [facility] Renal Consultation Note  Requesting MD: Mario Blanch, MD Indication for Consultation: End-stage renal disease  Chief complaint: Chest pain and rectal bleeding   HPI:  Corey Park is a 54 y.o. male with a history of end-stage renal disease on hemodialysis, hypertension, legally blind, CAD, and type 2 diabetes mellitus who presented to the hospital with chest pain as well as rectal bleeding.  He had left sided chest pressure which woke him from sleep about 2:30 am on Thursday AM.  He also had shortness of breath.  No n/v.  Cardiology was consulted.  Note that he had a CABG early in 2025.  Nephrology is consulted for assistance with management of end-stage renal disease.  He would ultimately like to get a kidney and states that he needs to get his phos down first.  He thought he was on phoslo  but states he gets binders through [facility] so he's unsure if has changed recently.  Auryxia  is on outpatient HD unit med list.  He states he wishes he could have assistance with meals - due to being blind he is limited to fast food or prepared meals mostly.     PMHx:   Past Medical History:  Diagnosis Date   Acute blood loss anemia 04/17/2022   Acute encephalopathy 08/11/2023   Acute hypoxic respiratory failure (HCC) 04/02/2023   Adult general medical exam 07/11/2022   Allergy, unspecified, initial encounter 08/25/2019   Asthma    as a child   Cellulitis, perineum 11/26/2019   CHF (congestive heart failure) (HCC) 2021   Complication of vascular dialysis catheter 08/25/2019   COVID-19 virus infection 07/27/2019   Diabetes mellitus without complication (HCC)    type 2   Dilated cardiomyopathy (HCC) 07/03/2018   Elevated troponin    ESRD on hemodialysis (HCC) 06/17/2018   MWF at [facility]   Fe deficiency anemia 12/23/2018   Hypertension    Legally blind    B/L   NSTEMI (non-ST elevated myocardial infarction) (HCC) 06/25/2023   Pneumonia 2021   Sepsis (HCC) 04/17/2022    Type 2 diabetes mellitus with diabetic peripheral angiopathy without gangrene (HCC) 08/25/2019   Unspecified protein-calorie malnutrition 08/25/2019    Past Surgical History:  Procedure Laterality Date   A/V FISTULAGRAM N/A 03/17/2024   Procedure: A/V Fistulagram;  Surgeon: Magda Debby SAILOR, MD;  Location: [facility];  Service: Cardiovascular;  Laterality: N/A;   BASCILIC VEIN TRANSPOSITION Right 10/16/2019   Procedure: Basilic Vein Transposition Right Arm;  Surgeon: Serene Gaile ORN, MD;  Location: [facility] OR;  Service: Vascular;  Laterality: Right;   BASCILIC VEIN TRANSPOSITION Right 01/29/2020   Procedure: RIGHT ARM SECOND STAGE BASCILIC VEIN TRANSPOSITION;  Surgeon: Serene Gaile ORN, MD;  Location: [facility] OR;  Service: Vascular;  Laterality: Right;   BIOPSY  12/22/2018   Procedure: BIOPSY;  Surgeon: Celestia Agent, MD;  Location: [facility];  Service: Endoscopy;;   CORONARY ARTERY BYPASS GRAFT N/A 06/28/2023   Procedure: CORONARY ARTERY BYPASS GRAFTING TIMES THREE USING LEFT INTERNAL MAMMARY ARTERY AND RIGHT GREAT SAPHENOUS VEIN HARVESTED ENDOSCOPICALLY;  Surgeon: Shyrl Linnie KIDD, MD;  Location: [facility] OR;  Service: Open Heart Surgery;  Laterality: N/A;   CORONARY ULTRASOUND/IVUS N/A 12/13/2022   Procedure: Coronary Ultrasound/IVUS;  Surgeon: Dann Candyce RAMAN, MD;  Location: [facility] INVASIVE CV LAB;  Service: Cardiovascular;  Laterality: N/A;   ESOPHAGOGASTRODUODENOSCOPY N/A 12/22/2018   Procedure: ESOPHAGOGASTRODUODENOSCOPY (EGD);  Surgeon: Celestia Agent, MD;  Location: [facility] ENDOSCOPY;  Service: Endoscopy;  Laterality: N/A;   IR  FLUORO GUIDE CV LINE RIGHT  07/29/2019   IR US  GUIDE VASC ACCESS RIGHT  07/29/2019   LEFT HEART CATH AND CORONARY ANGIOGRAPHY N/A 12/13/2022   Procedure: LEFT HEART CATH AND CORONARY ANGIOGRAPHY;  Surgeon: Dann Candyce RAMAN, MD;  Location: [facility] INVASIVE CV LAB;  Service: Cardiovascular;  Laterality: N/A;   LEFT HEART CATH AND CORONARY ANGIOGRAPHY N/A 06/25/2023    Procedure: LEFT HEART CATH AND CORONARY ANGIOGRAPHY;  Surgeon: Verlin Lonni BIRCH, MD;  Location: [facility] INVASIVE CV LAB;  Service: Cardiovascular;  Laterality: N/A;   TEE WITHOUT CARDIOVERSION N/A 06/28/2023   Procedure: TRANSESOPHAGEAL ECHOCARDIOGRAM (TEE);  Surgeon: Shyrl Linnie KIDD, MD;  Location: [facility] OR;  Service: Open Heart Surgery;  Laterality: N/A;   TRANSESOPHAGEAL ECHOCARDIOGRAM (CATH LAB) N/A 05/10/2023   Procedure: TRANSESOPHAGEAL ECHOCARDIOGRAM;  Surgeon: Barbaraann Darryle Ned, MD;  Location: [facility] INVASIVE CV LAB;  Service: Cardiovascular;  Laterality: N/A;    Family Hx:  Family History  Problem Relation Age of Onset   CAD Mother    Hypertension Mother    Diabetes Neg Hx    Stroke Neg Hx    Cancer Neg Hx    Kidney failure Neg Hx    Stomach cancer Neg Hx    Colon cancer Neg Hx    Rectal cancer Neg Hx     Social History:  reports that he has never smoked. He has never used smokeless tobacco. He reports that he does not currently use alcohol. He reports that he does not currently use drugs after having used the following drugs: Marijuana.  Allergies: Allergies[1]  Medications: Prior to Admission medications  Medication Sig Start Date End Date Taking? Authorizing Provider  atorvastatin  (LIPITOR ) 80 MG tablet Take 1 tablet (80 mg total) by mouth daily. 08/13/23  Yes Samtani, Jai-Gurmukh, MD  atropine  1 % ophthalmic solution Place 1 drop into the left eye daily. 02/13/24  Yes Christobal Guadalajara, MD  calcium  acetate (PHOSLO ) 667 MG capsule Take 3 capsules (2,001 mg total) by mouth with breakfast, with lunch, and with evening meal. 07/08/23  Yes Gold, Wayne E, PA-C  carvedilol  (COREG ) 25 MG tablet Take 25 mg by mouth every morning.   Yes [provider]  Cholecalciferol  (VITAMIN D3) 50 MCG (2000 UT) capsule Take 2,000 Units by mouth daily. 01/02/24  Yes [provider]  diphenhydrAMINE  (BENADRYL ) 25 MG tablet Take 25 mg by mouth every 6 (six) hours as needed for itching.    Yes [provider]  dorzolamide -timolol  (COSOPT ) 2-0.5 % ophthalmic solution Place 1 drop into the left eye 2 (two) times daily. 02/13/24  Yes Christobal Guadalajara, MD  famotidine  (PEPCID ) 20 MG tablet Take 20 mg by mouth daily. 04/20/23  Yes [provider]  ferric citrate  (AURYXIA ) 1 GM 210 MG(Fe) tablet Take 2 tablets (420 mg total) by mouth daily. Patient taking differently: Take 420 mg by mouth 3 (three) times daily with meals. 12/14/22  Yes Tobie Gaines, DO  lactulose  (CHRONULAC ) 10 GM/15ML solution Take 15 mLs (10 g total) by mouth daily as needed for mild constipation or moderate constipation. 03/10/24  Yes Ula Prentice SAUNDERS, MD  latanoprost  (XALATAN ) 0.005 % ophthalmic solution Place 1 drop into the left eye daily. 02/13/24  Yes Christobal Guadalajara, MD  multivitamin (RENA-VIT) TABS tablet Take 1 tablet by mouth daily. 11/22/22  Yes [provider]  pantoprazole  (PROTONIX ) 20 MG tablet Take 20 mg by mouth daily as needed for heartburn. 04/19/23  Yes [provider]  Vitamin D , Ergocalciferol , (DRISDOL ) 1.25 MG (  50000 UNIT) CAPS capsule Take 1 capsule (50,000 Units total) by mouth every 7 (seven) days. Patient taking differently: Take 50,000 Units by mouth every Monday. 08/19/23  Yes Samtani, Jai-Gurmukh, MD  erythromycin  ophthalmic ointment Place a 1/2 inch ribbon of ointment into the lower eyelid. Patient not taking: Reported on 07/03/2024 03/10/24   Ula Prentice SAUNDERS, MD  irbesartan  (AVAPRO ) 75 MG tablet Take 1 tablet (75 mg total) by mouth daily. Patient not taking: Reported on 07/03/2024 02/13/24 03/14/24  Christobal Guadalajara, MD  prednisoLONE  acetate (PRED FORTE ) 1 % ophthalmic suspension Place 1 drop into the left eye every 4 (four) hours. Patient not taking: Reported on 07/03/2024 02/13/24   Christobal Guadalajara, MD   I have reviewed the patient's current and reported prior to admission medications.   Labs:     Latest Ref Rng & Units 07/03/2024    7:39 AM 03/10/2024    7:39 AM 02/12/2024    2:22 AM  BMP   Glucose 70 - 99 mg/dL 853  97  867   BUN 6 - 20 mg/dL 34  92  51   Creatinine 0.61 - 1.24 mg/dL 1.21  81.71  86.40   Sodium 135 - 145 mmol/L 136  141  135   Potassium 3.5 - 5.1 mmol/L 4.6  5.1  5.0   Chloride 98 - 111 mmol/L 94  99  95   CO2 22 - 32 mmol/L 29  19  25    Calcium  8.9 - 10.3 mg/dL 9.9  9.3  9.1     Urinalysis    Component Value Date/Time   COLORURINE YELLOW 04/17/2022 1916   APPEARANCEUR CLEAR 04/17/2022 1916   LABSPEC 1.015 04/17/2022 1916   PHURINE 7.0 04/17/2022 1916   GLUCOSEU 50 (A) 04/17/2022 1916   HGBUR SMALL (A) 04/17/2022 1916   BILIRUBINUR NEGATIVE 04/17/2022 1916   KETONESUR NEGATIVE 04/17/2022 1916   PROTEINUR 100 (A) 04/17/2022 1916   NITRITE NEGATIVE 04/17/2022 1916   LEUKOCYTESUR TRACE (A) 04/17/2022 1916     ROS:  Pertinent items noted in HPI and remainder of comprehensive ROS otherwise negative.  Physical Exam: Vitals:   07/03/24 1900 07/03/24 2048  BP: 128/76 (!) 152/80  Pulse: 93 92  Resp: 19 11  Temp:  98.1 F (36.7 C)  SpO2: 100% 100%      General:  adult male in stretcher in NAD HEENT: NCAT Eyes: EOMI sclera anicteric Neck: supple trachea midline  Heart: S1S2 no rub Lungs: clear to auscultation; normal work of breathing; on room air  Abdomen: soft/nt/obese habitus/dist Extremities: no edema; no cyanosis or clubbing  Skin: no rash on extremities exposed Neuro: alert and oriented x 3 provides hx and follows commands  Psych:  normal mood and affect Access aneurysmal RUE AVF with bruit and thrill    Outpatient hemodialysis orders:  [facility] Monday Wednesday Friday 4 hours Blood flow 450 Dialysate flow auto 1.5 EDW 104 kg Last post weight 1/7 - 104.6 kg 2K/2.0 calcium  AV fistula Meds- Mircera 75 mcg every 2 weeks (last given on 1/7).   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 Phoslo  is not listed on his outpatient HD med list so I have removed here.   - Consider outpatient [employer] - renal diet - if possible   Thank you for the consult.  Please do not hesitate to contact me with any questions regarding our patient.    Katheryn JAYSON Saba 07/03/2024, 10:47 PM        [1]  Allergies Allergen Reactions   Gabapentin  Other (See Comments)    Hallucinations    Oxycodone  Other (See Comments)    Hallucinations    Imdur  [Isosorbide  Nitrate] Other (See Comments)    Endorsed headache in the past - willing to retry

## 2024-07-03 NOTE — ED Triage Notes (Addendum)
 Pt arrives via [facility] from home c/o L sided CP radiating to his back as well as SOB that woke him from sleep at 0230 this morning. Pt is a MWF dialysis pt with last tx yesterday. Pt is also blind. EMS administered 325 mg ASA PTA. Pt also reports concern for GI bleed that started today. Pt reports taking blood thinners, unsure of which one.

## 2024-07-03 NOTE — Telephone Encounter (Signed)
 Ordering Myoview  for chest pain and elevated troponins to be performed outpatient.

## 2024-07-03 NOTE — ED Provider Triage Note (Signed)
 Emergency Medicine Provider Triage Evaluation Note  Willy Pinkerton , a 54 y.o. male  was evaluated in triage.  Pt complains of chest pain.  Left-sided.  Described as pressure.  Started this morning, woke him from sleep.  Also reportedly tried to pass flatus and had sticky substance.  Concerned he might have a GI bleed as he is on the thinner.  He is blind, so he is unsure if it is actually blood or not.  Review of Systems  Positive: Chest pain Negative: Fevers  Physical Exam  BP 128/87 (BP Location: Left Arm)   Pulse 82   Temp 98.1 F (36.7 C) (Oral)   Resp 16   SpO2 99%  Gen:   Awake, no distress   Resp:  Normal effort, clear lungs MSK:   Moves extremities without difficulty  Other:  Fistula with palpable thrill  Medical Decision Making  Medically screening exam initiated at 8:20 AM.  Appropriate orders placed.  Tommy Farrington was informed that the remainder of the evaluation will be completed by another provider, this initial triage assessment does not replace that evaluation, and the importance of remaining in the ED until their evaluation is complete.  Presented for chest pain.  Did dialysis yesterday for full session.  Awoke with left-sided chest pain.  EKG appears similar to prior.  Screening labs ordered.   Neysa Caron PARAS, DO 07/03/24 0820

## 2024-07-03 NOTE — Progress Notes (Signed)
 Echocardiogram 2D Echocardiogram has been performed.  Corey Park 07/03/2024, 3:05 PM

## 2024-07-03 NOTE — ED Provider Notes (Signed)
 " [place] [facility] Provider Note   CSN: [id number] Arrival date & time: 07/03/24  9268     Patient presents with: Chest Pain   Corey Park is a 54 y.o. male.   Is a 54 year old male presenting emergency department chest pain  Left-sided.  Described as pressure.  Started this morning, woke him from sleep.  Also reportedly tried to pass flatus and had sticky substance.  Concerned he might have a GI bleed as he is on the thinner.  He is blind, so he is unsure if it is actually blood or not.   Chest Pain      Prior to Admission medications  Medication Sig Start Date End Date Taking? Authorizing Provider  atorvastatin  (LIPITOR ) 80 MG tablet Take 1 tablet (80 mg total) by mouth daily. 08/13/23   Samtani, Jai-Gurmukh, MD  atropine  1 % ophthalmic solution Place 1 drop into the left eye daily. 02/13/24   Christobal Guadalajara, MD  latanoprost  (XALATAN ) 0.005 % ophthalmic solution Place 1 drop into the left eye daily. 02/13/24   Christobal Guadalajara, MD  calcium  acetate (PHOSLO ) 667 MG capsule Take 3 capsules (2,001 mg total) by mouth with breakfast, with lunch, and with evening meal. 07/08/23   Gold, Wayne E, PA-C  carvedilol  (COREG ) 25 MG tablet Take 25 mg by mouth every morning.    [provider]  Cholecalciferol  (VITAMIN D3) 50 MCG (2000 UT) capsule Take 2,000 Units by mouth daily. 01/02/24   [provider]  dorzolamide -timolol  (COSOPT ) 2-0.5 % ophthalmic solution Place 1 drop into the left eye 2 (two) times daily. 02/13/24   Christobal Guadalajara, MD  erythromycin  ophthalmic ointment Place a 1/2 inch ribbon of ointment into the lower eyelid. 03/10/24   Ula Prentice SAUNDERS, MD  famotidine  (PEPCID ) 20 MG tablet Take 20 mg by mouth daily. 04/20/23   [provider]  ferric citrate  (AURYXIA ) 1 GM 210 MG(Fe) tablet Take 2 tablets (420 mg total) by mouth daily. Patient taking differently: Take 420 mg by mouth 3 (three) times daily with meals. 12/14/22   Tobie Gaines, DO   irbesartan  (AVAPRO ) 75 MG tablet Take 1 tablet (75 mg total) by mouth daily. 02/13/24 03/14/24  Christobal Guadalajara, MD  lactulose  (CHRONULAC ) 10 GM/15ML solution Take 15 mLs (10 g total) by mouth daily as needed for mild constipation or moderate constipation. 03/10/24   Ula Prentice SAUNDERS, MD  multivitamin (RENA-VIT) TABS tablet Take 1 tablet by mouth daily. 11/22/22   [provider]  oxyCODONE  (ROXICODONE ) 5 MG immediate release tablet Take 1 tablet (5 mg total) by mouth every 6 (six) hours as needed for severe pain (pain score 7-10). 03/10/24   Ula Prentice SAUNDERS, MD  pantoprazole  (PROTONIX ) 20 MG tablet Take 20 mg by mouth daily as needed for heartburn. 04/19/23   [provider]  prednisoLONE  acetate (PRED FORTE ) 1 % ophthalmic suspension Place 1 drop into the left eye every 4 (four) hours. 02/13/24   Christobal Guadalajara, MD  Vitamin D , Ergocalciferol , (DRISDOL ) 1.25 MG (50000 UNIT) CAPS capsule Take 1 capsule (50,000 Units total) by mouth every 7 (seven) days. Patient taking differently: Take 50,000 Units by mouth every Monday. 08/19/23   Samtani, Jai-Gurmukh, MD    Allergies: Gabapentin , Oxycodone , and Imdur  [isosorbide  nitrate]    Review of Systems  Cardiovascular:  Positive for chest pain.    Updated Vital Signs BP (!) 163/89 (BP Location: Left Arm)   Pulse 81   Temp 98.3 F (36.8 C) (  Oral)   Resp 18   SpO2 100%   Physical Exam Vitals and nursing note reviewed.  Constitutional:      General: He is not in acute distress.    Appearance: He is not toxic-appearing.  HENT:     Head: Normocephalic.  Cardiovascular:     Rate and Rhythm: Normal rate and regular rhythm.     Pulses:          Radial pulses are 2+ on the right side and 2+ on the left side.       Dorsalis pedis pulses are 2+ on the right side and 2+ on the left side.     Heart sounds: Normal heart sounds.  Pulmonary:     Breath sounds: Normal breath sounds.  Chest:     Chest wall: Tenderness present.  Genitourinary:     Rectum: Guaiac result negative.  Musculoskeletal:     Right lower leg: No edema.     Left lower leg: No edema.  Skin:    General: Skin is warm and dry.  Neurological:     General: No focal deficit present.     Mental Status: He is alert and oriented to person, place, and time.  Psychiatric:        Mood and Affect: Mood normal.        Behavior: Behavior normal.     (all labs ordered are listed, but only abnormal results are displayed) Labs Reviewed  BASIC METABOLIC PANEL WITH GFR - Abnormal; Notable for the following components:      Result Value   Chloride 94 (*)    Glucose, Bld 146 (*)    BUN 34 (*)    Creatinine, Ser 8.78 (*)    GFR, Estimated 7 (*)    All other components within normal limits  CBC - Abnormal; Notable for the following components:   RBC 3.39 (*)    Hemoglobin 11.1 (*)    HCT 33.9 (*)    RDW 16.7 (*)    All other components within normal limits  TROPONIN T, HIGH SENSITIVITY - Abnormal; Notable for the following components:   Troponin T High Sensitivity 194 (*)    All other components within normal limits  TROPONIN T, HIGH SENSITIVITY - Abnormal; Notable for the following components:   Troponin T High Sensitivity 196 (*)    All other components within normal limits  POC OCCULT BLOOD, ED  TYPE AND SCREEN    EKG: EKG Interpretation Date/Time:  Thursday July 03 2024 07:37:42 EST Ventricular Rate:  84 PR Interval:  180 QRS Duration:  120 QT Interval:  428 QTC Calculation: 505 R Axis:   -46  Text Interpretation: Normal sinus rhythm Possible Left atrial enlargement Left axis deviation Left ventricular hypertrophy with QRS widening ( Cornell product ) Confirmed by Neysa Clap [phone]) on 07/03/2024 8:05:47 AM  Radiology: DG Chest 2 View Result Date: 07/03/2024 EXAM: 2 VIEW(S) XRAY OF THE CHEST 07/03/2024 08:04:00 AM COMPARISON: 03/10/2024 CLINICAL HISTORY: CP FINDINGS: LUNGS AND PLEURA: No focal pulmonary opacity. Blunting of bilateral costophrenic  angles with possible trace pleural effusions. No pneumothorax. HEART AND MEDIASTINUM: Enlarged cardiomediastinal silhouette, unchanged. Atherosclerotic plaque. Post-CABG (Coronary Artery Bypass Graft) changes. BONES AND SOFT TISSUES: Intact sternotomy wires. IMPRESSION: 1. Blunting of the bilateral costophrenic angles with possible trace pleural effusions. Electronically signed by: Morgane Naveau MD MD 07/03/2024 08:09 AM EST RP Workstation: [id number]     Procedures   Medications Ordered in the ED  morphine  (PF) 2 MG/ML  injection 2 mg (has no administration in time range)                                    Medical Decision Making This is a 54 year old male with numerous comorbidities to include obesity, ESRD on dialysis Monday Wednesday Friday, last went yesterday.  Diabetes hypertension, CAD, blindness presented the emergency department for chest pain.  EKG similar to prior troponins elevated, but chronically so.  Slightly more elevated today.  Cardiology consulted and will evaluate patient.  Did complain of possible GI bleed, but hemoglobin normal.  No blood/melena on digital rectal exam and Hemoccult was negative.  Chest x-ray without pneumonia pneumothorax or fluid overload.  Given patient's elevated troponin and numerous comorbidities will admit for NSTEMI/chest pain  Amount and/or Complexity of Data Reviewed External Data Reviewed:     Details: It appears prior troponins on old assay were in the 100s, currently in 190s Labs: ordered. Decision-making details documented in ED Course. Radiology: ordered and independent interpretation performed. ECG/medicine tests: ordered. Discussion of management or test interpretation with external provider(s): Case discussed with Dr. Tobie with hospitalist team for admission.  Risk Prescription drug management. Decision regarding hospitalization. Diagnosis or treatment significantly limited by social determinants of health. Risk Details: Poor  health literacy.       Final diagnoses:  Chest pain, unspecified type    ED Discharge Orders     None          Neysa Caron PARAS, DO 07/03/24 1321  "

## 2024-07-04 ENCOUNTER — Other Ambulatory Visit: Payer: Self-pay

## 2024-07-04 ENCOUNTER — Encounter (HOSPITAL_COMMUNITY): Payer: Self-pay | Admitting: Internal Medicine

## 2024-07-04 DIAGNOSIS — R079 Chest pain, unspecified: Secondary | ICD-10-CM | POA: Diagnosis not present

## 2024-07-04 LAB — PHOSPHORUS: Phosphorus: 5.3 mg/dL — ABNORMAL HIGH (ref 2.5–4.6)

## 2024-07-04 LAB — CBC
HCT: 34.1 % — ABNORMAL LOW (ref 39.0–52.0)
Hemoglobin: 11.2 g/dL — ABNORMAL LOW (ref 13.0–17.0)
MCH: 32.5 pg (ref 26.0–34.0)
MCHC: 32.8 g/dL (ref 30.0–36.0)
MCV: 98.8 fL (ref 80.0–100.0)
Platelets: 168 K/uL (ref 150–400)
RBC: 3.45 MIL/uL — ABNORMAL LOW (ref 4.22–5.81)
RDW: 16.4 % — ABNORMAL HIGH (ref 11.5–15.5)
WBC: 6.6 K/uL (ref 4.0–10.5)
nRBC: 0 % (ref 0.0–0.2)

## 2024-07-04 LAB — COMPREHENSIVE METABOLIC PANEL WITH GFR
ALT: 19 U/L (ref 0–44)
AST: 28 U/L (ref 15–41)
Albumin: 3.9 g/dL (ref 3.5–5.0)
Alkaline Phosphatase: 96 U/L (ref 38–126)
Anion gap: 18 — ABNORMAL HIGH (ref 5–15)
BUN: 44 mg/dL — ABNORMAL HIGH (ref 6–20)
CO2: 25 mmol/L (ref 22–32)
Calcium: 10 mg/dL (ref 8.9–10.3)
Chloride: 95 mmol/L — ABNORMAL LOW (ref 98–111)
Creatinine, Ser: 10.9 mg/dL — ABNORMAL HIGH (ref 0.61–1.24)
GFR, Estimated: 5 mL/min — ABNORMAL LOW
Glucose, Bld: 143 mg/dL — ABNORMAL HIGH (ref 70–99)
Potassium: 4.8 mmol/L (ref 3.5–5.1)
Sodium: 138 mmol/L (ref 135–145)
Total Bilirubin: 0.6 mg/dL (ref 0.0–1.2)
Total Protein: 7.8 g/dL (ref 6.5–8.1)

## 2024-07-04 LAB — MAGNESIUM: Magnesium: 2 mg/dL (ref 1.7–2.4)

## 2024-07-04 LAB — HEPATITIS B SURFACE ANTIGEN: Hepatitis B Surface Ag: NONREACTIVE

## 2024-07-04 LAB — CBG MONITORING, ED
Glucose-Capillary: 189 mg/dL — ABNORMAL HIGH (ref 70–99)
Glucose-Capillary: 162 mg/dL — ABNORMAL HIGH (ref 70–99)

## 2024-07-04 LAB — LIPID PANEL
Cholesterol: 100 mg/dL (ref 0–200)
HDL: 27 mg/dL — ABNORMAL LOW
LDL Cholesterol: 50 mg/dL (ref 0–99)
Total CHOL/HDL Ratio: 3.7 ratio
Triglycerides: 113 mg/dL
VLDL: 23 mg/dL (ref 0–40)

## 2024-07-04 LAB — GLUCOSE, CAPILLARY
Glucose-Capillary: 217 mg/dL — ABNORMAL HIGH (ref 70–99)
Glucose-Capillary: 183 mg/dL — ABNORMAL HIGH (ref 70–99)

## 2024-07-04 MED ORDER — LIDOCAINE HCL (PF) 1 % IJ SOLN: 5.0000 mL | INTRAMUSCULAR | Status: DC | PRN

## 2024-07-04 MED ORDER — ALTEPLASE 2 MG IJ SOLR: 2.0000 mg | Freq: Once | INTRAMUSCULAR | Status: DC | PRN

## 2024-07-04 MED ORDER — ANTICOAGULANT SODIUM CITRATE 4% (200MG/5ML) IV SOLN: 5.0000 mL | Status: DC | PRN

## 2024-07-04 MED ORDER — LIDOCAINE-PRILOCAINE 2.5-2.5 % EX CREA: 1.0000 | TOPICAL_CREAM | CUTANEOUS | Status: DC | PRN

## 2024-07-04 MED ORDER — HYDROCORTISONE ACETATE 25 MG RE SUPP
25.0000 mg | Freq: Two times a day (BID) | RECTAL | Status: DC
  Administered 2024-07-05: 25 mg via RECTAL
  Filled 2024-07-04 (×2): qty 1

## 2024-07-04 MED ORDER — HEPARIN SODIUM (PORCINE) 1000 UNIT/ML DIALYSIS: 1000.0000 [IU] | INTRAMUSCULAR | Status: DC | PRN

## 2024-07-04 MED ORDER — DIPHENHYDRAMINE HCL 25 MG PO CAPS
25.0000 mg | ORAL_CAPSULE | Freq: Four times a day (QID) | ORAL | Status: DC | PRN
  Administered 2024-07-04: 25 mg via ORAL
  Filled 2024-07-04: qty 1

## 2024-07-04 MED ORDER — PENTAFLUOROPROP-TETRAFLUOROETH EX AERO: 1.0000 | INHALATION_SPRAY | CUTANEOUS | Status: DC | PRN

## 2024-07-04 MED ORDER — HEPARIN SODIUM (PORCINE) 1000 UNIT/ML DIALYSIS
2000.0000 [IU] | Freq: Once | INTRAMUSCULAR | Status: AC
  Administered 2024-07-04: 2000 [IU] via INTRAVENOUS_CENTRAL
  Filled 2024-07-04: qty 2

## 2024-07-04 NOTE — Progress Notes (Signed)
 " PROGRESS NOTE  Corey Park  FMW:[id number] DOB: [birth year] DOA: 07/03/2024 PCP: Jaycee Greig PARAS, NP   Brief Narrative: Patient is a 54 year old male with history of ESRD on dialysis, hypertension, legally blind, coronary artery disease, chronic combined CHF, diabetes,obesity who presented with chest pain, rectal bleeding.  Describes the chest pain as pressure which woke him up from sleep.  History of CABG.  FOBT negative on presentation.  Chest x-ray showed blunting of costophrenic angles with trace pleural effusion.  EKG showed T wave abnormalities in the lateral leads.  Cardiology, GI, nephrology consulted.Plan for dialysis today  Assessment & Plan:  Principal Problem:   Chest pain   Chest pain/history of coronary heart disease: History of CABG.  Elevated troponin with flat trend.  Takes Plavix , statin.  Cardiology following.  Limited echo did not show any regional wall motion abnormality, stable EF.  Cardiology signed off and recommended outpatient nuclear stress test.  Cardiology will arrange outpatient follow-up.  ESRD on dialysis: Dialyzed on Monday, Wednesday, Friday.  Has AV fistula in the right upper extremity.  Nephrology following.  Continue calcitriol .  Plan for dialysis today  Anemia of chronic disease/rectal bleeding: Hemoglobin stable in the range of 11.  On IV PPI.  Point-of-care stool occult blood test negative.  GI consulted.  Monitor H&H  Hypertension: On Coreg , Avapro   History of combined systolic/diastolic CHF: Last echo had shown EF of 45 to 50%.  Volume management as per dialysis.  Currently appears euvolemic  Diabetes type 2: Currently on sliding scale.  Patient is legally blind  Patient is morbidly obese        DVT prophylaxis:Place and maintain sequential compression device Start: 07/03/24 1445     Code Status: Full Code  Family Communication: None at the bedside  Patient status: Inpatient  Patient is from : Home  Anticipated discharge to:  Home  Estimated DC date: Tomorrow   Consultants: GI, cardiology, nephrology  Procedures: None yet  Antimicrobials:  Anti-infectives (From admission, onward)    None       Subjective: Patient seen and examined at bedside today.  Comfortable lying on bed.  Hemodynamically stable.  Denies chest pain during evaluation.  No active hematochezia or melena.  Denies new complaints  Objective: Vitals:   07/04/24 0248 07/04/24 0400 07/04/24 0446 07/04/24 0730  BP: (!) 142/87 (!) 147/86  131/77  Pulse: 81 86  76  Resp: (!) 21 16  14   Temp:   98.8 F (37.1 C)   TempSrc:   Oral   SpO2: 100% (!) 79%  100%   No intake or output data in the 24 hours ending 07/04/24 0826 There were no vitals filed for this visit.  Examination:  General exam: Overall comfortable, not in distress, morbidly obese HEENT: Blind Respiratory system:  no wheezes or crackles  Cardiovascular system: S1 & S2 heard, RRR.  Gastrointestinal system: Abdomen is nondistended, soft and nontender. Central nervous system: Alert and oriented Extremities: No edema, no clubbing ,no cyanosis, AV fistula on the right upper extremity Skin: No rashes, no ulcers,no icterus     Data Reviewed: I have personally reviewed following labs and imaging studies  CBC: Recent Labs  Lab 07/03/24 0739 07/04/24 0342  WBC 7.5 6.6  HGB 11.1* 11.2*  HCT 33.9* 34.1*  MCV 100.0 98.8  PLT 193 168   Basic Metabolic Panel: Recent Labs  Lab 07/03/24 0739 07/04/24 0342  NA 136 138  K 4.6 4.8  CL 94* 95*  CO2 29 25  GLUCOSE 146* 143*  BUN 34* 44*  CREATININE 8.78* 10.90*  CALCIUM  9.9 10.0  MG  --  2.0  PHOS  --  5.3*     No results found for this or any previous visit (from the past 240 hours).   Radiology Studies: ECHOCARDIOGRAM LIMITED Result Date: 07/03/2024    ECHOCARDIOGRAM LIMITED REPORT   Patient Name:   Corey Park Date of Exam: 07/03/2024 Medical Rec #:  [id number]       Height:       73.0 in Accession #:     [id number]      Weight:       227.5 lb Date of Birth:  01-06-1971       BSA:          2.273 m Patient Age:    53 years        BP:           163/89 mmHg Patient Gender: M               HR:           74 bpm. Exam Location:  Inpatient Procedure: Limited Echo, Cardiac Doppler, Color Doppler and Intracardiac            Opacification Agent (Both Spectral and Color Flow Doppler were            utilized during procedure). Indications:    Chest Pain R07.9  History:        Patient has prior history of Echocardiogram examinations, most                 recent 02/12/2024. CHF, Prior CABG; Risk Factors:Diabetes,                 Dyslipidemia and Hypertension.  Sonographer:    Merlynn Argyle Referring Phys: [id number] SHENG L HALEY IMPRESSIONS  1. Left ventricular ejection fraction, by estimation, is 45 to 50%. The left ventricle has mildly decreased function. The left ventricle demonstrates regional wall motion abnormalities (see scoring diagram/findings for description). There is mild left ventricular hypertrophy. Left ventricular diastolic parameters are consistent with Grade II diastolic dysfunction (pseudonormalization).  2. Right ventricular systolic function is mildly reduced. The right ventricular size is normal.  3. The mitral valve is degenerative. Mild mitral valve regurgitation.  4. Aortic valve regurgitation is trivial. Comparison(s): No significant change from prior study. Prior images reviewed side by side. FINDINGS  Left Ventricle: Left ventricular ejection fraction, by estimation, is 45 to 50%. The left ventricle has mildly decreased function. The left ventricle demonstrates regional wall motion abnormalities. Definity  contrast agent was given IV to delineate the left ventricular endocardial borders. There is mild left ventricular hypertrophy. Left ventricular diastolic parameters are consistent with Grade II diastolic dysfunction (pseudonormalization).  LV Wall Scoring: The antero-lateral wall and posterior wall are  hypokinetic. Right Ventricle: The right ventricular size is normal. Right ventricular systolic function is mildly reduced. Mitral Valve: The mitral valve is degenerative in appearance. Mild to moderate mitral annular calcification. Mild mitral valve regurgitation. Aortic Valve: Aortic valve regurgitation is trivial. Pulmonic Valve: Pulmonic valve regurgitation is mild. Aorta: Ascending aorta measurements are within normal limits for age when indexed to body surface area. LEFT VENTRICLE PLAX 2D LVIDd:         5.50 cm      Diastology LVIDs:         4.40 cm      LV e' medial:    5.03 cm/s LV PW:  1.20 cm      LV E/e' medial:  17.8 LV IVS:        1.10 cm      LV e' lateral:   9.00 cm/s                             LV E/e' lateral: 10.0  LV Volumes (MOD) LV vol d, MOD A2C: 189.0 ml LV vol d, MOD A4C: 180.0 ml LV vol s, MOD A2C: 112.0 ml LV vol s, MOD A4C: 118.0 ml LV SV MOD A2C:     77.0 ml LV SV MOD A4C:     180.0 ml LV SV MOD BP:      70.9 ml RIGHT VENTRICLE RV S prime:     7.38 cm/s TAPSE (M-mode): 1.4 cm                      PULMONIC VALVE AORTA                RVOT Peak grad: 2 mmHg Ao Asc diam: 3.90 cm  MITRAL VALVE MV Area (PHT): 5.50 cm    SHUNTS MV Decel Time: 138 msec    Pulmonic VTI: 0.156 m MV E velocity: 89.60 cm/s MV A velocity: 63.40 cm/s MV E/A ratio:  1.41 Soyla Merck MD Electronically signed by Soyla Merck MD Signature Date/Time: 07/03/2024/5:30:39 PM    Final    DG Chest 2 View Result Date: 07/03/2024 EXAM: 2 VIEW(S) XRAY OF THE CHEST 07/03/2024 08:04:00 AM COMPARISON: 03/10/2024 CLINICAL HISTORY: CP FINDINGS: LUNGS AND PLEURA: No focal pulmonary opacity. Blunting of bilateral costophrenic angles with possible trace pleural effusions. No pneumothorax. HEART AND MEDIASTINUM: Enlarged cardiomediastinal silhouette, unchanged. Atherosclerotic plaque. Post-CABG (Coronary Artery Bypass Graft) changes. BONES AND SOFT TISSUES: Intact sternotomy wires. IMPRESSION: 1. Blunting of the bilateral  costophrenic angles with possible trace pleural effusions. Electronically signed by: Morgane Naveau MD MD 07/03/2024 08:09 AM EST RP Workstation: [id number]    Scheduled Meds:  atorvastatin   80 mg Oral Daily   atropine   1 drop Left Eye Daily   calcitRIOL   1.75 mcg Oral Q M,W,F-HD   carvedilol   25 mg Oral q morning   Chlorhexidine  Gluconate Cloth  6 each Topical Q0600   cholecalciferol   2,000 Units Oral Daily   dorzolamide -timolol   1 drop Left Eye BID   ferric citrate   420 mg Oral TID WC   insulin  aspart  0-6 Units Subcutaneous TID WC   irbesartan   75 mg Oral Daily   latanoprost   1 drop Left Eye Daily    morphine  injection  2 mg Intravenous Once   multivitamin  1 tablet Oral Daily   pantoprazole  (PROTONIX ) IV  40 mg Intravenous Q12H   prednisoLONE  acetate  1 drop Left Eye Q4H   sodium chloride  flush  3 mL Intravenous Q12H   Continuous Infusions:  furosemide  (LASIX ) 200 mg in dextrose  5 % 100 mL (2 mg/mL) infusion 4 mg/hr (07/03/24 1727)     LOS: 1 day   Ivonne Mustache, MD [facility] P1/02/2025, 8:26 AM  "

## 2024-07-04 NOTE — Consult Note (Signed)
 "                                               Consultation Note   Referring Provider:   [facility] PCP: Corey Greig PARAS, NP Primary Gastroenterologist:   Corey Holt, MD   Reason for Consultation: Rectal bleeding DOA: 07/03/2024         Hospital Day: 2   Assessment and Plan:  54 year old male with multiple medical problems including CAD status post CABG in 2025, combined systolic and diastolic heart failure, bilateral blindness, ESRD on HD.  Currently admitted with chest pain and rectal bleeding x 1 episode.    Chronic constipation with hard stools One episode of painless , small volume bleeding during passage of flatus yesterday.  Suspect hemorrhoidal bleeding in setting of chronic constipation. Patient has a complete colonoscopy with a good bowel prep in 2022 with findings of internal hemorrhoids, no polyps / cancers.    Hgb is stable at baseline of 11.2  Colace 1-2 at bedtime Glycerin suppositories as needed to avoid straining. I explained how / when to use Course of Anusol  suppositories BID x 7 days  ESRD on HD MWF  Type 2 diabetes  Glaucoma  GERD Takes famotidine  or pantoprazole  at home ( out of meds)  History of PUD H. Pylori gastritis (2020)  See PMH for any additional medical history  / medical problems   Principal Problem:   Chest pain  History of Present Illness:  Brief History:  Patient is a 54 year old male with multiple medical problems not limited to hypertension, hyperlipidemia, diabetes, CAD status post CABG in 2025, chronic combined systolic and diastolic heart failure, ESRD on HD, obesity, PUD /H. pylori infection,  and GERD.  Patient was last seen by us  in 2022, at that time for evaluation of GERD/dysphagia (see EGD report report below)   Interval History:  Corey Park presented to the ED yesterday for evaluation of chest pain and rectal bleeding.    On admission : Patient was hemodynamically stable. Labs notable for:   hemoglobin 11.1  (at baseline).   Ferritin 1812 , TIBC 227 iron  saturation 52%, folate greater than 20, B12 850, LFTs normal , FOBT negative, high-sensitivity troponin 194   Chest x-ray -possible bilateral trace effusions                                                                                                                                                              +++++++++++++++++++++++++++++++++++++  Since Admission :   Hemoglobin stable at 11.2   Most recent Endoscopic Procedures:   03/22/2021 screening colonoscopy Good prep Perianal and digital rectal exams were normal.  A  few small mouth diverticula in the sigmoid colon, a large lipoma, 15 mm in diameter in the transverse colon, exam otherwise normal throughout the entire colon, Nonbleeding internal hemorrhoids found.  Repeat in 10 years  03/22/2021 EGD for dysphagia and chest pain Fluid in the esophagus.  Discolored mucosa in the esophagus, biopsied.  Gastritis, biopsied.  Nodule in the duodenum, questionable cystic lesion, biopsied.  Nodule in the duodenum, biopsied.  No abnormalities to explain the dysphagia.  Esophageal findings of copious frothy sputum could be suggestive of an underlying dysmotility disorder of the esophagus  Diagnosis 1. Surgical [P], distal duodenum nodule - BENIGN DUODENAL MUCOSA - NO ACUTE INFLAMMATION, VILLOUS BLUNTING OR INCREASED INTRAEPITHELIAL LYMPHOCYTES IDENTIFIED 2. Surgical [P], duodenal bulb nodules - PEPTIC DUODENITIS - NO DYSPLASIA OR MALIGNANCY IDENTIFIED 3. Surgical [P], gastric antrum and gastric body - CHRONIC ACTIVE GASTRITIS - H. PYLORI ORGANISMS PRESENT - NO INTESTINAL METAPLASIA IDENTIFIED - SEE COMMENT 4. Surgical [P], mid esophagus and distal esophagus - BENIGN SQUAMOUS MUCOSA - NO INCREASED INTRAEPITHELIAL EOSINOPHILS   Recent Labs    07/04/24 0342  PROT 7.8  ALBUMIN  3.9  AST 28  ALT 19  ALKPHOS 96  BILITOT 0.6   Recent Labs    07/03/24 0739 07/04/24 0342  WBC 7.5 6.6  HGB 11.1*  11.2*  HCT 33.9* 34.1*  MCV 100.0 98.8  PLT 193 168   Recent Labs    07/03/24 0739 07/04/24 0342  NA 136 138  K 4.6 4.8  CL 94* 95*  CO2 29 25  GLUCOSE 146* 143*  BUN 34* 44*  CREATININE 8.78* 10.90*  CALCIUM  9.9 10.0   Review of Systems: All systems reviewed and negative except where noted in HPI.  Physical Exam: Vital signs in last 24 hours: Temp:  [97.7 F (36.5 C)-98.8 F (37.1 C)] 97.7 F (36.5 C) (01/09 0841) Pulse Rate:  [76-93] 77 (01/09 0845) Resp:  [11-34] 16 (01/09 0845) BP: (122-168)/(70-137) 145/92 (01/09 0845) SpO2:  [77 %-100 %] 100 % (01/09 0845)   General:  Pleasant male in NAD Psych:  Cooperative. Normal mood and affect Eyes: Pupils equal Ears:  Normal auditory acuity Nose: No deformity, discharge or lesions Neck:  Supple, no masses felt Lungs:  Clear to auscultation anteriorly Heart:  Regular rate, regular rhythm.  Abdomen:  Soft, nondistended, nontender, active bowel sounds, no masses felt Rectal :  Scant amount of light brown stool in vault. No masses felt.  Msk: Symmetrical without gross deformities.  Neurologic:  Alert, oriented, grossly normal neurologically Extremities : No edema Skin:  Intact without significant lesions.   OUTPATIENT MEDICATIONS Prior to Admission medications  Medication Sig Start Date End Date Taking? Authorizing Provider  atorvastatin  (LIPITOR ) 80 MG tablet Take 1 tablet (80 mg total) by mouth daily. 08/13/23  Yes Samtani, Jai-Gurmukh, MD  atropine  1 % ophthalmic solution Place 1 drop into the left eye daily. 02/13/24  Yes Christobal Guadalajara, MD  calcium  acetate (PHOSLO ) 667 MG capsule Take 3 capsules (2,001 mg total) by mouth with breakfast, with lunch, and with evening meal. 07/08/23  Yes Gold, Wayne E, PA-C  carvedilol  (COREG ) 25 MG tablet Take 25 mg by mouth every morning.   Yes [provider]  Cholecalciferol  (VITAMIN D3) 50 MCG (2000 UT) capsule Take 2,000 Units by mouth daily. 01/02/24  Yes [provider]  diphenhydrAMINE  (BENADRYL ) 25 MG tablet Take 25 mg by mouth every 6 (six) hours as needed for itching.   Yes [provider]  dorzolamide -timolol  (COSOPT )  2-0.5 % ophthalmic solution Place 1 drop into the left eye 2 (two) times daily. 02/13/24  Yes Christobal Guadalajara, MD  famotidine  (PEPCID ) 20 MG tablet Take 20 mg by mouth daily. 04/20/23  Yes [provider]  ferric citrate  (AURYXIA ) 1 GM 210 MG(Fe) tablet Take 2 tablets (420 mg total) by mouth daily. Patient taking differently: Take 420 mg by mouth 3 (three) times daily with meals. 12/14/22  Yes Tobie Gaines, DO  lactulose  (CHRONULAC ) 10 GM/15ML solution Take 15 mLs (10 g total) by mouth daily as needed for mild constipation or moderate constipation. 03/10/24  Yes Ula Prentice SAUNDERS, MD  latanoprost  (XALATAN ) 0.005 % ophthalmic solution Place 1 drop into the left eye daily. 02/13/24  Yes Christobal Guadalajara, MD  multivitamin (RENA-VIT) TABS tablet Take 1 tablet by mouth daily. 11/22/22  Yes [provider]  pantoprazole  (PROTONIX ) 20 MG tablet Take 20 mg by mouth daily as needed for heartburn. 04/19/23  Yes [provider]  Vitamin D , Ergocalciferol , (DRISDOL ) 1.25 MG (50000 UNIT) CAPS capsule Take 1 capsule (50,000 Units total) by mouth every 7 (seven) days. Patient taking differently: Take 50,000 Units by mouth every Monday. 08/19/23  Yes Samtani, Jai-Gurmukh, MD  erythromycin  ophthalmic ointment Place a 1/2 inch ribbon of ointment into the lower eyelid. Patient not taking: Reported on 07/03/2024 03/10/24   Ula Prentice SAUNDERS, MD  irbesartan  (AVAPRO ) 75 MG tablet Take 1 tablet (75 mg total) by mouth daily. Patient not taking: Reported on 07/03/2024 02/13/24 03/14/24  Christobal Guadalajara, MD  prednisoLONE  acetate (PRED FORTE ) 1 % ophthalmic suspension Place 1 drop into the left eye every 4 (four) hours. Patient not taking: Reported on 07/03/2024 02/13/24   Christobal Guadalajara, MD    Allergies as of 07/03/2024 - Review Complete 07/03/2024  Allergen Reaction Noted    Gabapentin  Other (See Comments) 08/12/2023   Oxycodone  Other (See Comments) 08/12/2023   Imdur  [isosorbide  nitrate] Other (See Comments) 05/10/2023    INPATIENT MEDICATIONS Current Facility-Administered Medications  Medication Dose Route Frequency Provider Last Rate Last Admin   acetaminophen  (TYLENOL ) tablet 650 mg  650 mg Oral Q6H PRN Patel, Ekta V, MD       Or   acetaminophen  (TYLENOL ) suppository 650 mg  650 mg Rectal Q6H PRN Patel, Ekta V, MD       atorvastatin  (LIPITOR ) tablet 80 mg  80 mg Oral Daily Patel, Ekta V, MD   80 mg at 07/03/24 2153   atropine  1 % ophthalmic solution 1 drop  1 drop Left Eye Daily Tobie Mario GAILS, MD       calcitRIOL  (ROCALTROL ) capsule 1.75 mcg  1.75 mcg Oral Q M,W,F-HD Jerrye Katheryn BROCKS, MD       carvedilol  (COREG ) tablet 25 mg  25 mg Oral q morning Patel, Ekta V, MD   25 mg at 07/03/24 2153   Chlorhexidine  Gluconate Cloth 2 % PADS 6 each  6 each Topical Q0600 Jerrye Katheryn BROCKS, MD       cholecalciferol  (VITAMIN D3) 25 MCG (1000 UNIT) tablet 2,000 Units  2,000 Units Oral Daily Tobie Mario GAILS, MD   2,000 Units at 07/03/24 1716   dorzolamide -timolol  (COSOPT ) 2-0.5 % ophthalmic solution 1 drop  1 drop Left Eye BID Tobie Mario GAILS, MD   1 drop at 07/04/24 0753   ferric citrate  (AURYXIA ) tablet 420 mg  420 mg Oral TID WC Patel, Ekta V, MD   420 mg at 07/04/24 0748   furosemide  (LASIX ) 200 mg in dextrose  5 % 100  mL (2 mg/mL) infusion  4 mg/hr Intravenous Continuous Darryle Currier L, PA-C 2 mL/hr at 07/03/24 1727 4 mg/hr at 07/03/24 1727   insulin  aspart (novoLOG ) injection 0-6 Units  0-6 Units Subcutaneous TID WC Patel, Ekta V, MD   1 Units at 07/04/24 0750   irbesartan  (AVAPRO ) tablet 75 mg  75 mg Oral Daily Tobie Mario GAILS, MD       latanoprost  (XALATAN ) 0.005 % ophthalmic solution 1 drop  1 drop Left Eye Daily Tobie Mario GAILS, MD   1 drop at 07/04/24 9247   morphine  (PF) 2 MG/ML injection 2 mg  2 mg Intravenous Once Young, Travis J, DO       multivitamin (RENA-VIT) tablet 1  tablet  1 tablet Oral Daily Tobie Mario GAILS, MD       oxyCODONE  (Oxy IR/ROXICODONE ) immediate release tablet 5 mg  5 mg Oral Q6H PRN Patel, Ekta V, MD       pantoprazole  (PROTONIX ) injection 40 mg  40 mg Intravenous Q12H Patel, Ekta V, MD   40 mg at 07/03/24 2153   prednisoLONE  acetate (PRED FORTE ) 1 % ophthalmic suspension 1 drop  1 drop Left Eye Q4H Tobie Mario V, MD   1 drop at 07/04/24 0646   sodium chloride  flush (NS) 0.9 % injection 3 mL  3 mL Intravenous Q12H Tobie Mario GAILS, MD   3 mL at 07/03/24 1717   Current Outpatient Medications  Medication Sig Dispense Refill   atorvastatin  (LIPITOR ) 80 MG tablet Take 1 tablet (80 mg total) by mouth daily. 30 tablet 3   atropine  1 % ophthalmic solution Place 1 drop into the left eye daily. 2 mL 12   calcium  acetate (PHOSLO ) 667 MG capsule Take 3 capsules (2,001 mg total) by mouth with breakfast, with lunch, and with evening meal. 270 capsule 1   carvedilol  (COREG ) 25 MG tablet Take 25 mg by mouth every morning.     Cholecalciferol  (VITAMIN D3) 50 MCG (2000 UT) capsule Take 2,000 Units by mouth daily.     diphenhydrAMINE  (BENADRYL ) 25 MG tablet Take 25 mg by mouth every 6 (six) hours as needed for itching.     dorzolamide -timolol  (COSOPT ) 2-0.5 % ophthalmic solution Place 1 drop into the left eye 2 (two) times daily. 10 mL 12   famotidine  (PEPCID ) 20 MG tablet Take 20 mg by mouth daily.     ferric citrate  (AURYXIA ) 1 GM 210 MG(Fe) tablet Take 2 tablets (420 mg total) by mouth daily. (Patient taking differently: Take 420 mg by mouth 3 (three) times daily with meals.) 60 tablet 0   lactulose  (CHRONULAC ) 10 GM/15ML solution Take 15 mLs (10 g total) by mouth daily as needed for mild constipation or moderate constipation. 236 mL 0   latanoprost  (XALATAN ) 0.005 % ophthalmic solution Place 1 drop into the left eye daily. 2.5 mL 12   multivitamin (RENA-VIT) TABS tablet Take 1 tablet by mouth daily.     pantoprazole  (PROTONIX ) 20 MG tablet Take 20 mg by mouth  daily as needed for heartburn.     Vitamin D , Ergocalciferol , (DRISDOL ) 1.25 MG (50000 UNIT) CAPS capsule Take 1 capsule (50,000 Units total) by mouth every 7 (seven) days. (Patient taking differently: Take 50,000 Units by mouth every Monday.) 5 capsule 0   erythromycin  ophthalmic ointment Place a 1/2 inch ribbon of ointment into the lower eyelid. (Patient not taking: Reported on 07/03/2024) 9 each 0   irbesartan  (AVAPRO ) 75 MG tablet Take 1 tablet (75 mg total) by  mouth daily. (Patient not taking: Reported on 07/03/2024) 30 tablet 0   prednisoLONE  acetate (PRED FORTE ) 1 % ophthalmic suspension Place 1 drop into the left eye every 4 (four) hours. (Patient not taking: Reported on 07/03/2024) 5 mL 0     Past Medical History:  Diagnosis Date   Acute blood loss anemia 04/17/2022   Acute encephalopathy 08/11/2023   Acute hypoxic respiratory failure (HCC) 04/02/2023   Adult general medical exam 07/11/2022   Allergy, unspecified, initial encounter 08/25/2019   Asthma    as a child   Cellulitis, perineum 11/26/2019   CHF (congestive heart failure) (HCC) 2021   Complication of vascular dialysis catheter 08/25/2019   COVID-19 virus infection 07/27/2019   Diabetes mellitus without complication (HCC)    type 2   Dilated cardiomyopathy (HCC) 07/03/2018   Elevated troponin    ESRD on hemodialysis (HCC) 06/17/2018   MWF at [facility]   Fe deficiency anemia 12/23/2018   Hypertension    Legally blind    B/L   NSTEMI (non-ST elevated myocardial infarction) (HCC) 06/25/2023   Pneumonia 2021   Sepsis (HCC) 04/17/2022   Type 2 diabetes mellitus with diabetic peripheral angiopathy without gangrene (HCC) 08/25/2019   Unspecified protein-calorie malnutrition 08/25/2019    Past Surgical History:  Procedure Laterality Date   A/V FISTULAGRAM N/A 03/17/2024   Procedure: A/V Fistulagram;  Surgeon: Magda Debby SAILOR, MD;  Location: [facility];  Service: Cardiovascular;  Laterality: N/A;   BASCILIC VEIN TRANSPOSITION  Right 10/16/2019   Procedure: Basilic Vein Transposition Right Arm;  Surgeon: Serene Gaile ORN, MD;  Location: [facility] OR;  Service: Vascular;  Laterality: Right;   BASCILIC VEIN TRANSPOSITION Right 01/29/2020   Procedure: RIGHT ARM SECOND STAGE BASCILIC VEIN TRANSPOSITION;  Surgeon: Serene Gaile ORN, MD;  Location: [facility] OR;  Service: Vascular;  Laterality: Right;   BIOPSY  12/22/2018   Procedure: BIOPSY;  Surgeon: Celestia Agent, MD;  Location: [facility] ENDOSCOPY;  Service: Endoscopy;;   CORONARY ARTERY BYPASS GRAFT N/A 06/28/2023   Procedure: CORONARY ARTERY BYPASS GRAFTING TIMES THREE USING LEFT INTERNAL MAMMARY ARTERY AND RIGHT GREAT SAPHENOUS VEIN HARVESTED ENDOSCOPICALLY;  Surgeon: Shyrl Linnie KIDD, MD;  Location: [facility] OR;  Service: Open Heart Surgery;  Laterality: N/A;   CORONARY ULTRASOUND/IVUS N/A 12/13/2022   Procedure: Coronary Ultrasound/IVUS;  Surgeon: Dann Candyce RAMAN, MD;  Location: [facility] INVASIVE CV LAB;  Service: Cardiovascular;  Laterality: N/A;   ESOPHAGOGASTRODUODENOSCOPY N/A 12/22/2018   Procedure: ESOPHAGOGASTRODUODENOSCOPY (EGD);  Surgeon: Celestia Agent, MD;  Location: [facility] ENDOSCOPY;  Service: Endoscopy;  Laterality: N/A;   IR FLUORO GUIDE CV LINE RIGHT  07/29/2019   IR US  GUIDE VASC ACCESS RIGHT  07/29/2019   LEFT HEART CATH AND CORONARY ANGIOGRAPHY N/A 12/13/2022   Procedure: LEFT HEART CATH AND CORONARY ANGIOGRAPHY;  Surgeon: Dann Candyce RAMAN, MD;  Location: [facility] INVASIVE CV LAB;  Service: Cardiovascular;  Laterality: N/A;   LEFT HEART CATH AND CORONARY ANGIOGRAPHY N/A 06/25/2023   Procedure: LEFT HEART CATH AND CORONARY ANGIOGRAPHY;  Surgeon: Verlin Lonni BIRCH, MD;  Location: [facility] INVASIVE CV LAB;  Service: Cardiovascular;  Laterality: N/A;   TEE WITHOUT CARDIOVERSION N/A 06/28/2023   Procedure: TRANSESOPHAGEAL ECHOCARDIOGRAM (TEE);  Surgeon: Shyrl Linnie KIDD, MD;  Location: [facility] OR;  Service: Open Heart Surgery;  Laterality: N/A;   TRANSESOPHAGEAL ECHOCARDIOGRAM (CATH  LAB) N/A 05/10/2023   Procedure: TRANSESOPHAGEAL ECHOCARDIOGRAM;  Surgeon: Barbaraann Darryle Debby, MD;  Location: [facility] INVASIVE CV LAB;  Service: Cardiovascular;  Laterality: N/A;    Family History  Problem Relation Age of Onset   CAD Mother    Hypertension Mother    Diabetes Neg Hx    Stroke Neg Hx    Cancer Neg Hx    Kidney failure Neg Hx    Stomach cancer Neg Hx    Colon cancer Neg Hx    Rectal cancer Neg Hx     Social History   Socioeconomic History   Marital status: Single    Spouse name: Not on file   Number of children: Not on file   Years of education: Not on file   Highest education level: Not on file  Occupational History   Not on file  Tobacco Use   Smoking status: Never   Smokeless tobacco: Never  Vaping Use   Vaping status: Never Used  Substance and Sexual Activity   Alcohol use: Not Currently   Drug use: Not Currently    Types: Marijuana   Sexual activity: Not on file  Other Topics Concern   Not on file  Social History Narrative   Not on file   Social Drivers of Health   Tobacco Use: Low Risk (03/10/2024)   Patient History    Smoking Tobacco Use: Never    Smokeless Tobacco Use: Never    Passive Exposure: Not on file  Financial Resource Strain: Low Risk (11/01/2023)   Overall Financial Resource Strain (CARDIA)    Difficulty of Paying Living Expenses: Not very hard  Recent Concern: Physicist, Medical Strain - High Risk (08/23/2023)   Overall Financial Resource Strain (CARDIA)    Difficulty of Paying Living Expenses: Hard  Food Insecurity: No Food Insecurity (02/11/2024)   Epic    Worried About Radiation Protection Practitioner of Food in the Last Year: Never true    Ran Out of Food in the Last Year: Never true  Transportation Needs: No Transportation Needs (02/11/2024)   Epic    Lack of Transportation (Medical): No    Lack of Transportation (Non-Medical): No  Physical Activity: Insufficiently Active (11/01/2023)   Exercise Vital Sign    Days of Exercise per Week: 7 days     Minutes of Exercise per Session: 20 min  Stress: No Stress Concern Present (11/01/2023)   [employer] of Occupational Health - Occupational Stress Questionnaire    Feeling of Stress : Not at all  Social Connections: Moderately Integrated (11/01/2023)   Social Connection and Isolation Panel    Frequency of Communication with Friends and Family: More than three times a week    Frequency of Social Gatherings with Friends and Family: More than three times a week    Attends Religious Services: More than 4 times per year    Active Member of Clubs or Organizations: Yes    Attends Banker Meetings: More than 4 times per year    Marital Status: Never married  Intimate Partner Violence: Not At Risk (02/11/2024)   Epic    Fear of Current or Ex-Partner: No    Emotionally Abused: No    Physically Abused: No    Sexually Abused: No  Depression (PHQ2-9): Low Risk (11/01/2023)   Depression (PHQ2-9)    PHQ-2 Score: 2  Alcohol Screen: Low Risk (11/01/2023)   Alcohol Screen    Last Alcohol Screening Score (AUDIT): 0  Housing: Low Risk (02/11/2024)   Epic    Unable to Pay for Housing in the Last Year: No    Number of Times Moved in the Last Year: 0    Homeless in the Last  Year: No  Utilities: Not At Risk (02/11/2024)   Epic    Threatened with loss of utilities: No  Health Literacy: Inadequate Health Literacy (08/23/2023)   B1300 Health Literacy    Frequency of need for help with medical instructions: Sometimes    Code Status   Code Status: Full Code   Vina Dasen, NP-C   07/04/2024, 9:47 AM     "

## 2024-07-04 NOTE — Progress Notes (Signed)
 Patient arrived to room 6E15,VS stable, free from pain. Patient oriented to unit and call bell in reach. Patient has clothes, flops, wallet, keys, and cell phone at bedside.

## 2024-07-04 NOTE — Progress Notes (Signed)
 Heart Failure Navigator Progress Note  Assessed for Heart & Vascular TOC clinic readiness.  Patient does not meet criteria due to she is an ESRD patient on hemodialysis. No HF TOC. .   Navigator will sign off at this time.   Stephane Haddock, BSN, Scientist, Clinical (histocompatibility And Immunogenetics) Only

## 2024-07-04 NOTE — Progress Notes (Addendum)
 [facility] Progress Note  Subjective:  Seen in ED room No new c/o's  Presentation summary:  54 y.o. male with a history of end-stage renal disease on hemodialysis, hypertension, legally blind, CAD, and type 2 diabetes mellitus who presented to the hospital with chest pain as well as rectal bleeding.  He had left sided chest pressure which woke him from sleep about 2:30 am on Thursday AM.  He also had shortness of breath.  No n/v.  Cardiology was consulted.  Note that he had a CABG early in 2025.  Nephrology is consulted for assistance with management of end-stage renal disease.  He would ultimately like to get a kidney and states that he needs to get his phos down first.  He thought he was on phoslo  but states he gets binders through [facility] so he's unsure if has changed recently.  Auryxia  is on outpatient HD unit med list.  He states he wishes he could have assistance with meals - due to being blind he is limited to fast food or prepared meals mostly.   Vitals:   07/04/24 0248 07/04/24 0400 07/04/24 0446 07/04/24 0730  BP: (!) 142/87 (!) 147/86  131/77  Pulse: 81 86  76  Resp: (!) 21 16  14   Temp:   98.8 F (37.1 C)   TempSrc:   Oral   SpO2: 100% (!) 79%  100%    Exam: General:  adult male in NAD Heart: S1S2 no rub Lungs: clear to auscultation, normal wob, on room air  Abdomen: soft/nt/obese habitus/dist Extremities: no edema; no cyanosis or clubbing  Skin: no rash on extremities exposed Neuro: alert and oriented x 3 provides hx and follows commands     RUE AVF +bruit/ thrill      OP HD: [facility] MWF 4h  B450   104kg   AVF  Heparin  2000 Mircera 75 mcg every 2 weeks (last given on 1/7).   Venofer 50 mg weekly Calcitriol  1.75 mcg three times a week      Assessment/Plan:   # Chest pain - note s/p CABG 2025 - workup per primary team and cardiology     # Possible rectal bleeding - patient blind but reported a sticky substance - Work-up per primary team    #  ESRD - usual HD is MWF - HD on schedule for today   # HTN  - resume home regimen to include beta blocker  - optimize volume status with HD    # CHF w/ mildly reduced EF - on coreg  - optimize volume status with HD   # Anemia of CKD - Note recent ESA administration outpatient - Hemoglobin is acceptable   # Metabolic bone disease - Resumed calcitriol  - Resume home auryxia . Phoslo  is not listed on his outpatient HD med list so I have removed here.   - Consider outpatient Mom's Meals - renal diet - if possible     Myer Fret MD  [facility] 07/04/2024, 8:22 AM  Recent Labs  Lab 07/03/24 0739 07/04/24 0342  HGB 11.1* 11.2*  ALBUMIN   --  3.9  CALCIUM  9.9 10.0  PHOS  --  5.3*  CREATININE 8.78* 10.90*  K 4.6 4.8   Recent Labs  Lab 07/03/24 1437  IRON  117  TIBC 227*  FERRITIN 1,812*   Inpatient medications:  atorvastatin   80 mg Oral Daily   atropine   1 drop Left Eye Daily   calcitRIOL   1.75 mcg Oral Q M,W,F-HD   carvedilol   25 mg  Oral q morning   Chlorhexidine  Gluconate Cloth  6 each Topical Q0600   cholecalciferol   2,000 Units Oral Daily   dorzolamide -timolol   1 drop Left Eye BID   ferric citrate   420 mg Oral TID WC   insulin  aspart  0-6 Units Subcutaneous TID WC   irbesartan   75 mg Oral Daily   latanoprost   1 drop Left Eye Daily    morphine  injection  2 mg Intravenous Once   multivitamin  1 tablet Oral Daily   pantoprazole  (PROTONIX ) IV  40 mg Intravenous Q12H   prednisoLONE  acetate  1 drop Left Eye Q4H   sodium chloride  flush  3 mL Intravenous Q12H    furosemide  (LASIX ) 200 mg in dextrose  5 % 100 mL (2 mg/mL) infusion 4 mg/hr (07/03/24 1727)   acetaminophen  **OR** acetaminophen , oxyCODONE 

## 2024-07-04 NOTE — Progress Notes (Signed)
 Pt receives out-pt HD at [facility] on [facility] on MWF 3:55 pm chair time. Will assist as needed.   Randine Mungo Dialysis Navigator [phone]

## 2024-07-04 NOTE — Procedures (Signed)
 S: seen in [place]. Tolerating HD well. No /co's.   Vitals:   07/04/24 1400 07/04/24 1430 07/04/24 1530 07/04/24 1600  BP: (!) 142/81 125/77 (!) 115/55 103/68  Pulse: 79 82 84 79  Resp: 20 (!) 21 17 18   Temp:      TempSrc:      SpO2: 100% 100% 100% 95%  Weight:        Recent Labs  Lab 07/03/24 0739 07/04/24 0342  HGB 11.1* 11.2*  ALBUMIN   --  3.9  CALCIUM  9.9 10.0  PHOS  --  5.3*  CREATININE 8.78* 10.90*  K 4.6 4.8    Inpatient medications:  atorvastatin   80 mg Oral Daily   atropine   1 drop Left Eye Daily   calcitRIOL   1.75 mcg Oral Q M,W,F-HD   carvedilol   25 mg Oral q morning   Chlorhexidine  Gluconate Cloth  6 each Topical Q0600   cholecalciferol   2,000 Units Oral Daily   dorzolamide -timolol   1 drop Left Eye BID   ferric citrate   420 mg Oral TID WC   hydrocortisone   25 mg Rectal BID   insulin  aspart  0-6 Units Subcutaneous TID WC   irbesartan   75 mg Oral Daily   latanoprost   1 drop Left Eye Daily    morphine  injection  2 mg Intravenous Once   multivitamin  1 tablet Oral Daily   pantoprazole  (PROTONIX ) IV  40 mg Intravenous Q12H   prednisoLONE  acetate  1 drop Left Eye Q4H   sodium chloride  flush  3 mL Intravenous Q12H    anticoagulant sodium citrate      acetaminophen  **OR** acetaminophen , alteplase , anticoagulant sodium citrate , diphenhydrAMINE , heparin , heparin , lidocaine  (PF), lidocaine -prilocaine , oxyCODONE , pentafluoroprop-tetrafluoroeth  I was present at the procedure, reviewed the HD regimen and made appropriate changes.   Myer Fret MD  [facility] 07/04/2024, 4:26 PM

## 2024-07-05 ENCOUNTER — Other Ambulatory Visit (HOSPITAL_COMMUNITY): Payer: Self-pay

## 2024-07-05 DIAGNOSIS — R079 Chest pain, unspecified: Secondary | ICD-10-CM | POA: Diagnosis not present

## 2024-07-05 LAB — CBC
HCT: 33.6 % — ABNORMAL LOW (ref 39.0–52.0)
Hemoglobin: 11.4 g/dL — ABNORMAL LOW (ref 13.0–17.0)
MCH: 32.9 pg (ref 26.0–34.0)
MCHC: 33.9 g/dL (ref 30.0–36.0)
MCV: 97.1 fL (ref 80.0–100.0)
Platelets: 191 K/uL (ref 150–400)
RBC: 3.46 MIL/uL — ABNORMAL LOW (ref 4.22–5.81)
RDW: 16.5 % — ABNORMAL HIGH (ref 11.5–15.5)
WBC: 7.2 K/uL (ref 4.0–10.5)
nRBC: 0 % (ref 0.0–0.2)

## 2024-07-05 LAB — BASIC METABOLIC PANEL WITH GFR
Anion gap: 11 (ref 5–15)
BUN: 32 mg/dL — ABNORMAL HIGH (ref 6–20)
CO2: 31 mmol/L (ref 22–32)
Calcium: 10 mg/dL (ref 8.9–10.3)
Chloride: 93 mmol/L — ABNORMAL LOW (ref 98–111)
Creatinine, Ser: 8.71 mg/dL — ABNORMAL HIGH (ref 0.61–1.24)
GFR, Estimated: 7 mL/min — ABNORMAL LOW
Glucose, Bld: 175 mg/dL — ABNORMAL HIGH (ref 70–99)
Potassium: 4.4 mmol/L (ref 3.5–5.1)
Sodium: 134 mmol/L — ABNORMAL LOW (ref 135–145)

## 2024-07-05 LAB — GLUCOSE, CAPILLARY: Glucose-Capillary: 165 mg/dL — ABNORMAL HIGH (ref 70–99)

## 2024-07-05 MED ORDER — DOCUSATE SODIUM 100 MG PO CAPS
100.0000 mg | ORAL_CAPSULE | Freq: Two times a day (BID) | ORAL | 0 refills | Status: AC
  Filled 2024-07-05: qty 60, 30d supply, fill #0

## 2024-07-05 MED ORDER — HYDROCORTISONE ACETATE 25 MG RE SUPP
25.0000 mg | Freq: Two times a day (BID) | RECTAL | 0 refills | Status: AC
  Filled 2024-07-05: qty 14, 7d supply, fill #0

## 2024-07-05 MED ORDER — LACTULOSE 10 GM/15ML PO SOLN
10.0000 g | Freq: Two times a day (BID) | ORAL | 0 refills | Status: AC | PRN
  Filled 2024-07-05: qty 473, 16d supply, fill #0

## 2024-07-05 NOTE — Progress Notes (Signed)
 " [facility] Progress Note   Subjective:   Patient seen and examined at bedside. Feeling well this morning.  Hoping to go home today.  Denies chest pain, shortness of breath, edema, n/v/d and rectal bleeding.   Objective Vitals:   07/04/24 1951 07/04/24 2335 07/05/24 0400 07/05/24 0833  BP: 103/64 137/79 115/75 116/64  Pulse: 77 84 88 85  Resp: 16 18 18 16   Temp: 98.4 F (36.9 C) 98.4 F (36.9 C) 98.6 F (37 C) 98.7 F (37.1 C)  TempSrc: Oral Oral Oral Oral  SpO2: 100% 100% 99% 100%  Weight:       Physical Exam General:pleasant, well appearing, blind male in NAD Heart:RRR Lungs:CTAB, nml WOB on RA Abdomen:soft, NTND Extremities:no LE edema Dialysis Access: RU AVF +b/t   Filed Weights   07/04/24 1328 07/04/24 1744  Weight: 107 kg 103.4 kg    Intake/Output Summary (Last 24 hours) at 07/05/2024 1014 Last data filed at 07/05/2024 0900 Gross per 24 hour  Intake 275.39 ml  Output 3300 ml  Net -3024.61 ml    Additional Objective Labs: Basic Metabolic Panel: Recent Labs  Lab 07/03/24 0739 07/04/24 0342 07/05/24 0434  NA 136 138 134*  K 4.6 4.8 4.4  CL 94* 95* 93*  CO2 29 25 31   GLUCOSE 146* 143* 175*  BUN 34* 44* 32*  CREATININE 8.78* 10.90* 8.71*  CALCIUM  9.9 10.0 10.0  PHOS  --  5.3*  --    Liver Function Tests: Recent Labs  Lab 07/04/24 0342  AST 28  ALT 19  ALKPHOS 96  BILITOT 0.6  PROT 7.8  ALBUMIN  3.9   CBC: Recent Labs  Lab 07/03/24 0739 07/04/24 0342 07/05/24 0434  WBC 7.5 6.6 7.2  HGB 11.1* 11.2* 11.4*  HCT 33.9* 34.1* 33.6*  MCV 100.0 98.8 97.1  PLT 193 168 191   CBG: Recent Labs  Lab 07/04/24 0731 07/04/24 1127 07/04/24 1830 07/04/24 2058 07/05/24 0714  GLUCAP 189* 162* 217* 183* 165*   Iron  Studies:  Recent Labs    07/03/24 1437  IRON  117  TIBC 227*  FERRITIN 1,812*   Lab Results  Component Value Date   INR 1.4 (H) 06/28/2023   Studies/Results: ECHOCARDIOGRAM LIMITED Result Date: 07/03/2024     ECHOCARDIOGRAM LIMITED REPORT   Patient Name:   ESTEPHAN GALLARDO Date of Exam: 07/03/2024 Medical Rec #:  [id number]       Height:       73.0 in Accession #:    [id number]      Weight:       227.5 lb Date of Birth:  [birth year]       BSA:          2.273 m Patient Age:    54 years        BP:           163/89 mmHg Patient Gender: M               HR:           74 bpm. Exam Location:  Inpatient Procedure: Limited Echo, Cardiac Doppler, Color Doppler and Intracardiac            Opacification Agent (Both Spectral and Color Flow Doppler were            utilized during procedure). Indications:    Chest Pain R07.9  History:        Patient has prior history of Echocardiogram examinations, most  recent 02/12/2024. CHF, Prior CABG; Risk Factors:Diabetes,                 Dyslipidemia and Hypertension.  Sonographer:    Merlynn Argyle Referring Phys: [id number] SHENG L HALEY IMPRESSIONS  1. Left ventricular ejection fraction, by estimation, is 45 to 50%. The left ventricle has mildly decreased function. The left ventricle demonstrates regional wall motion abnormalities (see scoring diagram/findings for description). There is mild left ventricular hypertrophy. Left ventricular diastolic parameters are consistent with Grade II diastolic dysfunction (pseudonormalization).  2. Right ventricular systolic function is mildly reduced. The right ventricular size is normal.  3. The mitral valve is degenerative. Mild mitral valve regurgitation.  4. Aortic valve regurgitation is trivial. Comparison(s): No significant change from prior study. Prior images reviewed side by side. FINDINGS  Left Ventricle: Left ventricular ejection fraction, by estimation, is 45 to 50%. The left ventricle has mildly decreased function. The left ventricle demonstrates regional wall motion abnormalities. Definity  contrast agent was given IV to delineate the left ventricular endocardial borders. There is mild left ventricular hypertrophy. Left ventricular  diastolic parameters are consistent with Grade II diastolic dysfunction (pseudonormalization).  LV Wall Scoring: The antero-lateral wall and posterior wall are hypokinetic. Right Ventricle: The right ventricular size is normal. Right ventricular systolic function is mildly reduced. Mitral Valve: The mitral valve is degenerative in appearance. Mild to moderate mitral annular calcification. Mild mitral valve regurgitation. Aortic Valve: Aortic valve regurgitation is trivial. Pulmonic Valve: Pulmonic valve regurgitation is mild. Aorta: Ascending aorta measurements are within normal limits for age when indexed to body surface area. LEFT VENTRICLE PLAX 2D LVIDd:         5.50 cm      Diastology LVIDs:         4.40 cm      LV e' medial:    5.03 cm/s LV PW:         1.20 cm      LV E/e' medial:  17.8 LV IVS:        1.10 cm      LV e' lateral:   9.00 cm/s                             LV E/e' lateral: 10.0  LV Volumes (MOD) LV vol d, MOD A2C: 189.0 ml LV vol d, MOD A4C: 180.0 ml LV vol s, MOD A2C: 112.0 ml LV vol s, MOD A4C: 118.0 ml LV SV MOD A2C:     77.0 ml LV SV MOD A4C:     180.0 ml LV SV MOD BP:      70.9 ml RIGHT VENTRICLE RV S prime:     7.38 cm/s TAPSE (M-mode): 1.4 cm                      PULMONIC VALVE AORTA                RVOT Peak grad: 2 mmHg Ao Asc diam: 3.90 cm  MITRAL VALVE MV Area (PHT): 5.50 cm    SHUNTS MV Decel Time: 138 msec    Pulmonic VTI: 0.156 m MV E velocity: 89.60 cm/s MV A velocity: 63.40 cm/s MV E/A ratio:  1.41 Soyla Merck MD Electronically signed by Soyla Merck MD Signature Date/Time: 07/03/2024/5:30:39 PM    Final     Medications:   atorvastatin   80 mg Oral Daily   atropine   1 drop Left Eye Daily  calcitRIOL   1.75 mcg Oral Q M,W,F-HD   carvedilol   25 mg Oral q morning   Chlorhexidine  Gluconate Cloth  6 each Topical Q0600   cholecalciferol   2,000 Units Oral Daily   dorzolamide -timolol   1 drop Left Eye BID   ferric citrate   420 mg Oral TID WC   hydrocortisone   25 mg Rectal BID    insulin  aspart  0-6 Units Subcutaneous TID WC   irbesartan   75 mg Oral Daily   latanoprost   1 drop Left Eye Daily    morphine  injection  2 mg Intravenous Once   multivitamin  1 tablet Oral Daily   pantoprazole  (PROTONIX ) IV  40 mg Intravenous Q12H   prednisoLONE  acetate  1 drop Left Eye Q4H   sodium chloride  flush  3 mL Intravenous Q12H    Dialysis Orders: [facility] MWF 4h  B450   104kg   AVF  Heparin  2000 Mircera 75 mcg every 2 weeks (last given on 1/7).   Venofer 50 mg weekly Calcitriol  1.75 mcg three times a week  Assessment/Plan: 1. Chest pain - resolved.  S/p CABG 2025. ECHO with stable EF. Needs nuclear stress test as outpatient.  2. Possible rectal bleed - seen by GI, suspect hemorrhoidal bleeding in setting of chronic constipation. 2. ESRD - on HD MWF.  Next HD on 04/06/24.  3. Anemia of CKD- Hgb stable.  ESA recently dose. 4. Secondary hyperparathyroidism - Ca and phos in goal.  Continue home meds - auryxia  and calcitriol .  5. HTN/volume - Blood pressure in goal. Continue home meds.  Does not appear overloaded. Left a little under EDW yesterday.  No changes.  6. Nutrition - Renal diet with fluid restrictions.  Could consider mom Meals - Renal diet if available as outpatient. Due to needing assistance with meals.   Manuelita Labella, PA-C [facility] 07/05/2024,10:14 AM  LOS: 2 days    "

## 2024-07-05 NOTE — Plan of Care (Signed)

## 2024-07-05 NOTE — Progress Notes (Signed)
 Pt D/C home and educated on med list and updates. Ready for D/C

## 2024-07-05 NOTE — Discharge Summary (Signed)
 Physician Discharge Summary  Corey Park FMW:[id number] DOB: 03-Mar-1971 DOA: 07/03/2024  PCP: Jaycee Greig PARAS, NP  Admit date: 07/03/2024 Discharge date: 07/05/2024  Admitted From: Home Disposition:  Home  Discharge Condition:Stable CODE STATUS:FULL Diet recommendation: renal  Brief/Interim Summary: Patient is a 54 year old male with history of ESRD on dialysis, hypertension, legally blind, coronary artery disease, chronic combined CHF, diabetes,obesity who presented with chest pain, rectal bleeding.  Describes the chest pain as pressure which woke him up from sleep.  History of CABG.  FOBT negative on presentation.  Chest x-ray showed blunting of costophrenic angles with trace pleural effusion.  EKG showed T wave abnormalities in the lateral leads.  Cardiology, GI, nephrology consulted.  Chest pain resolved while in the ED.  Echo did not show any wall motion abnormality, cardiology signed off.  He underwent dialysis.  No further reports of rectal bleeding.  Hemoglobin stable.  Medically stable for discharge home today.  Following problems were addressed during the hospitalization:  Chest pain/history of coronary heart disease: History of CABG.  Elevated troponin with flat trend.  Takes Plavix , statin.  Cardiology following.  Limited echo did not show any regional wall motion abnormality, stable EF.  Cardiology signed off and recommended outpatient nuclear stress test.  Cardiology will arrange outpatient follow-up.   ESRD on dialysis: Dialyzed on Monday, Wednesday, Friday.  Has AV fistula in the right upper extremity.  Nephrology following.  Continue calcitriol .  Underwent dialysis here   Anemia of chronic disease/rectal bleeding: Hemoglobin stable in the range of 11. Point-of-care stool occult blood test negative.  GI were consulted.  Suspected to be from chronic constipation, internal hemorrhoids.  Continue bowel regimen on discharge.  Started on Colace, lactulose .  Also given prescription for  Anusol   suppositories   Hypertension: On Coreg , Avapro .  Continue at home   History of combined systolic/diastolic CHF: Last echo had shown EF of 45 to 50%.  Volume management as per dialysis.  Currently appears euvolemic   Diabetes type 2: A1c of 6.7.  Currently diet controlled.  Follow-up with PCP for management of diabetes.  No medication started on this admission.   Patient is legally blind   Patient is morbidly obese   Discharge Diagnoses:  Principal Problem:   Chest pain    Discharge Instructions  Discharge Instructions     Diet renal with fluid restriction   Complete by: As directed    Discharge instructions   Complete by: As directed    1)Please take your medications as instructed 2)Follow up with your PCP in a week   Increase activity slowly   Complete by: As directed       Allergies as of 07/05/2024       Reactions   Gabapentin  Other (See Comments)   Hallucinations    Oxycodone  Other (See Comments)   Hallucinations    Imdur  [isosorbide  Nitrate] Other (See Comments)   Endorsed headache in the past - willing to retry        Medication List     STOP taking these medications    prednisoLONE  acetate 1 % ophthalmic suspension Commonly known as: PRED FORTE        TAKE these medications    atorvastatin  80 MG tablet Commonly known as: LIPITOR  Take 1 tablet (80 mg total) by mouth daily.   atropine  1 % ophthalmic solution Place 1 drop into the left eye daily.   calcium  acetate 667 MG capsule Commonly known as: PHOSLO  Take 3 capsules (2,001 mg total) by mouth  with breakfast, with lunch, and with evening meal.   carvedilol  25 MG tablet Commonly known as: COREG  Take 25 mg by mouth every morning.   diphenhydrAMINE  25 MG tablet Commonly known as: BENADRYL  Take 25 mg by mouth every 6 (six) hours as needed for itching.   docusate sodium  100 MG capsule Commonly known as: Colace Take 1 capsule (100 mg total) by mouth 2 (two) times daily.    dorzolamide -timolol  2-0.5 % ophthalmic solution Commonly known as: COSOPT  Place 1 drop into the left eye 2 (two) times daily.   erythromycin  ophthalmic ointment Place a 1/2 inch ribbon of ointment into the lower eyelid.   famotidine  20 MG tablet Commonly known as: PEPCID  Take 20 mg by mouth daily.   ferric citrate  1 GM 210 MG(Fe) tablet Commonly known as: Auryxia  Take 2 tablets (420 mg total) by mouth daily. What changed: when to take this   hydrocortisone  25 MG suppository Commonly known as: ANUSOL -HC Place 1 suppository (25 mg total) rectally 2 (two) times daily for 7 days.   irbesartan  75 MG tablet Commonly known as: AVAPRO  Take 1 tablet (75 mg total) by mouth daily.   lactulose  10 GM/15ML solution Commonly known as: CHRONULAC  Take 15 mLs (10 g total) by mouth daily as needed for mild constipation or moderate constipation.   latanoprost  0.005 % ophthalmic solution Commonly known as: XALATAN  Place 1 drop into the left eye daily.   multivitamin Tabs tablet Take 1 tablet by mouth daily.   pantoprazole  20 MG tablet Commonly known as: PROTONIX  Take 20 mg by mouth daily as needed for heartburn.   Vitamin D  (Ergocalciferol ) 1.25 MG (50000 UNIT) Caps capsule Commonly known as: DRISDOL  Take 1 capsule (50,000 Units total) by mouth every 7 (seven) days. What changed: when to take this   Vitamin D3 50 MCG (2000 UT) capsule Take 2,000 Units by mouth daily.        Follow-up Information     Jaycee Greig PARAS, NP. Schedule an appointment as soon as possible for a visit in 1 week(s).   Specialty: Nurse Practitioner Contact information: [address] [place] [address] [phone]                Allergies[1]  Consultations: GI, nephrology, cardiology   Procedures/Studies: ECHOCARDIOGRAM LIMITED Result Date: 07/03/2024    ECHOCARDIOGRAM LIMITED REPORT   Patient Name:   Corey Park Date of Exam: 07/03/2024 Medical Rec #:  [id number]        Height:       73.0 in Accession #:    [id number]      Weight:       227.5 lb Date of Birth:  08/10/1970       BSA:          2.273 m Patient Age:    53 years        BP:           163/89 mmHg Patient Gender: M               HR:           74 bpm. Exam Location:  Inpatient Procedure: Limited Echo, Cardiac Doppler, Color Doppler and Intracardiac            Opacification Agent (Both Spectral and Color Flow Doppler were            utilized during procedure). Indications:    Chest Pain R07.9  History:        Patient  has prior history of Echocardiogram examinations, most                 recent 02/12/2024. CHF, Prior CABG; Risk Factors:Diabetes,                 Dyslipidemia and Hypertension.  Sonographer:    Merlynn Argyle Referring Phys: [id number] SHENG L HALEY IMPRESSIONS  1. Left ventricular ejection fraction, by estimation, is 45 to 50%. The left ventricle has mildly decreased function. The left ventricle demonstrates regional wall motion abnormalities (see scoring diagram/findings for description). There is mild left ventricular hypertrophy. Left ventricular diastolic parameters are consistent with Grade II diastolic dysfunction (pseudonormalization).  2. Right ventricular systolic function is mildly reduced. The right ventricular size is normal.  3. The mitral valve is degenerative. Mild mitral valve regurgitation.  4. Aortic valve regurgitation is trivial. Comparison(s): No significant change from prior study. Prior images reviewed side by side. FINDINGS  Left Ventricle: Left ventricular ejection fraction, by estimation, is 45 to 50%. The left ventricle has mildly decreased function. The left ventricle demonstrates regional wall motion abnormalities. Definity  contrast agent was given IV to delineate the left ventricular endocardial borders. There is mild left ventricular hypertrophy. Left ventricular diastolic parameters are consistent with Grade II diastolic dysfunction (pseudonormalization).  LV Wall Scoring: The  antero-lateral wall and posterior wall are hypokinetic. Right Ventricle: The right ventricular size is normal. Right ventricular systolic function is mildly reduced. Mitral Valve: The mitral valve is degenerative in appearance. Mild to moderate mitral annular calcification. Mild mitral valve regurgitation. Aortic Valve: Aortic valve regurgitation is trivial. Pulmonic Valve: Pulmonic valve regurgitation is mild. Aorta: Ascending aorta measurements are within normal limits for age when indexed to body surface area. LEFT VENTRICLE PLAX 2D LVIDd:         5.50 cm      Diastology LVIDs:         4.40 cm      LV e' medial:    5.03 cm/s LV PW:         1.20 cm      LV E/e' medial:  17.8 LV IVS:        1.10 cm      LV e' lateral:   9.00 cm/s                             LV E/e' lateral: 10.0  LV Volumes (MOD) LV vol d, MOD A2C: 189.0 ml LV vol d, MOD A4C: 180.0 ml LV vol s, MOD A2C: 112.0 ml LV vol s, MOD A4C: 118.0 ml LV SV MOD A2C:     77.0 ml LV SV MOD A4C:     180.0 ml LV SV MOD BP:      70.9 ml RIGHT VENTRICLE RV S prime:     7.38 cm/s TAPSE (M-mode): 1.4 cm                      PULMONIC VALVE AORTA                RVOT Peak grad: 2 mmHg Ao Asc diam: 3.90 cm  MITRAL VALVE MV Area (PHT): 5.50 cm    SHUNTS MV Decel Time: 138 msec    Pulmonic VTI: 0.156 m MV E velocity: 89.60 cm/s MV A velocity: 63.40 cm/s MV E/A ratio:  1.41 Soyla Merck MD Electronically signed by Soyla Merck MD Signature Date/Time: 07/03/2024/5:30:39 PM  Final    DG Chest 2 View Result Date: 07/03/2024 EXAM: 2 VIEW(S) XRAY OF THE CHEST 07/03/2024 08:04:00 AM COMPARISON: 03/10/2024 CLINICAL HISTORY: CP FINDINGS: LUNGS AND PLEURA: No focal pulmonary opacity. Blunting of bilateral costophrenic angles with possible trace pleural effusions. No pneumothorax. HEART AND MEDIASTINUM: Enlarged cardiomediastinal silhouette, unchanged. Atherosclerotic plaque. Post-CABG (Coronary Artery Bypass Graft) changes. BONES AND SOFT TISSUES: Intact sternotomy wires.  IMPRESSION: 1. Blunting of the bilateral costophrenic angles with possible trace pleural effusions. Electronically signed by: Morgane Naveau MD MD 07/03/2024 08:09 AM EST RP Workstation: [id number]      Subjective: Patient seen and examined at bedside today.  Hemodynamically stable.  Overall comfortable.  Lying in bed.  No further episodes of rectal bleeding.  No chest pain.  Had dialysis yesterday.  Medically stable for discharge  Discharge Exam: Vitals:   07/05/24 0400 07/05/24 0833  BP: 115/75 116/64  Pulse: 88 85  Resp: 18 16  Temp: 98.6 F (37 C) 98.7 F (37.1 C)  SpO2: 99% 100%   Vitals:   07/04/24 1951 07/04/24 2335 07/05/24 0400 07/05/24 0833  BP: 103/64 137/79 115/75 116/64  Pulse: 77 84 88 85  Resp: 16 18 18 16   Temp: 98.4 F (36.9 C) 98.4 F (36.9 C) 98.6 F (37 C) 98.7 F (37.1 C)  TempSrc: Oral Oral Oral Oral  SpO2: 100% 100% 99% 100%  Weight:        General: Pt is alert, awake, not in acute distress, obese Cardiovascular: RRR, S1/S2 +, no rubs, no gallops Respiratory: CTA bilaterally, no wheezing, no rhonchi Abdominal: Soft, NT, ND, bowel sounds + Extremities: no edema, no cyanosis, AV fistula on the right upper extremity    The results of significant diagnostics from this hospitalization (including imaging, microbiology, ancillary and laboratory) are listed below for reference.     Microbiology: No results found for this or any previous visit (from the past 240 hours).   Labs: BNP (last 3 results) Recent Labs    07/25/23 2330 08/11/23 1448 02/11/24 0621  BNP 1,545.2* 2,012.6* 1,037.0*   Basic Metabolic Panel: Recent Labs  Lab 07/03/24 0739 07/04/24 0342 07/05/24 0434  NA 136 138 134*  K 4.6 4.8 4.4  CL 94* 95* 93*  CO2 29 25 31   GLUCOSE 146* 143* 175*  BUN 34* 44* 32*  CREATININE 8.78* 10.90* 8.71*  CALCIUM  9.9 10.0 10.0  MG  --  2.0  --   PHOS  --  5.3*  --    Liver Function Tests: Recent Labs  Lab 07/04/24 0342  AST 28   ALT 19  ALKPHOS 96  BILITOT 0.6  PROT 7.8  ALBUMIN  3.9   No results for input(s): LIPASE, AMYLASE in the last 168 hours. No results for input(s): AMMONIA in the last 168 hours. CBC: Recent Labs  Lab 07/03/24 0739 07/04/24 0342 07/05/24 0434  WBC 7.5 6.6 7.2  HGB 11.1* 11.2* 11.4*  HCT 33.9* 34.1* 33.6*  MCV 100.0 98.8 97.1  PLT 193 168 191   Cardiac Enzymes: No results for input(s): CKTOTAL, CKMB, CKMBINDEX, TROPONINI in the last 168 hours. BNP: Invalid input(s): POCBNP CBG: Recent Labs  Lab 07/04/24 0731 07/04/24 1127 07/04/24 1830 07/04/24 2058 07/05/24 0714  GLUCAP 189* 162* 217* 183* 165*   D-Dimer No results for input(s): DDIMER in the last 72 hours. Hgb A1c Recent Labs    07/03/24 1730  HGBA1C 6.7*   Lipid Profile Recent Labs    07/04/24 0342  CHOL 100  HDL 27*  LDLCALC 50  TRIG 113  CHOLHDL 3.7   Thyroid function studies Recent Labs    07/03/24 1437  TSH 1.110   Anemia work up Recent Labs    07/03/24 1437  VITAMINB12 850  FOLATE >20.0  FERRITIN 1,812*  TIBC 227*  IRON  117  RETICCTPCT 1.9   Urinalysis    Component Value Date/Time   COLORURINE YELLOW 04/17/2022 1916   APPEARANCEUR CLEAR 04/17/2022 1916   LABSPEC 1.015 04/17/2022 1916   PHURINE 7.0 04/17/2022 1916   GLUCOSEU 50 (A) 04/17/2022 1916   HGBUR SMALL (A) 04/17/2022 1916   BILIRUBINUR NEGATIVE 04/17/2022 1916   KETONESUR NEGATIVE 04/17/2022 1916   PROTEINUR 100 (A) 04/17/2022 1916   NITRITE NEGATIVE 04/17/2022 1916   LEUKOCYTESUR TRACE (A) 04/17/2022 1916   Sepsis Labs Recent Labs  Lab 07/03/24 0739 07/04/24 0342 07/05/24 0434  WBC 7.5 6.6 7.2   Microbiology No results found for this or any previous visit (from the past 240 hours).  Please note: You were cared for by a hospitalist during your hospital stay. Once you are discharged, your primary care physician will handle any further medical issues. Please note that NO REFILLS for any  discharge medications will be authorized once you are discharged, as it is imperative that you return to your primary care physician (or establish a relationship with a primary care physician if you do not have one) for your post hospital discharge needs so that they can reassess your need for medications and monitor your lab values.    Time coordinating discharge: 40 minutes  SIGNED:   Ivonne Mustache, MD  [facility] 07/05/2024, 10:11 AM Pager [phone]  If 7PM-7AM, please contact night-coverage [email or web address] Password [email or web address]    [1]  Allergies Allergen Reactions   Gabapentin  Other (See Comments)    Hallucinations    Oxycodone  Other (See Comments)    Hallucinations    Imdur  [Isosorbide  Nitrate] Other (See Comments)    Endorsed headache in the past - willing to retry

## 2024-07-05 NOTE — Progress Notes (Signed)
 DISCHARGE NOTE HOME Jak Curenton to be discharged Home per MD order. Discussed prescriptions and follow up appointments with the patient. Prescriptions given to patient; medication list explained in detail. Patient verbalized understanding.  Skin clean, dry and intact without evidence of skin break down, no evidence of skin tears noted. IV catheter discontinued intact. Site without signs and symptoms of complications. Dressing and pressure applied. Pt denies pain at the site currently. No complaints noted.  Patient free of lines, drains, and wounds.   An After Visit Summary (AVS) was printed and given to the patient. Patient escorted via wheelchair, and discharged home via private auto.  Peyton SHAUNNA Pepper, RN

## 2024-07-05 NOTE — Discharge Planning (Signed)
 [facility] Patient Discharge Orders- [facility] CLINIC: [facility]  Patient's name: Corey Park Admit/DC Dates: 07/03/2024 - 07/05/2024  Discharge Diagnoses: Chest pain - needs stress test as outpatient   Possible rectal bleeding d/t hemorrhoids 2/2 constipation - Hgb stable here.   HD ORDER CHANGES: Heparin  change: no EDW Change: no Bath Change: no       ANEMIA MANAGEMENT: Aranesp : Given: no    PRBC's Given no ESA dose for discharge: no change IV Iron  dose at discharge: no change   BONE/MINERAL MEDICATIONS: Hectorol/Calcitriol  change: no Sensipar /Parsabiv change: no   ACCESS INTERVENTION/CHANGE: none Details:   RECENT LABS: Recent Labs  Lab 07/04/24 0342 07/05/24 0434  K 4.8 4.4  CALCIUM  10.0 10.0  ALBUMIN  3.9  --   PHOS 5.3*  --    Recent Labs  Lab 07/05/24 0434  HGB 11.4*   Blood Culture    Component Value Date/Time   SDES BLOOD LEFT ANTECUBITAL 05/09/2023 1822   SPECREQUEST  05/09/2023 1822    BOTTLES DRAWN AEROBIC ONLY Blood Culture adequate volume   CULT  05/09/2023 1822    NO GROWTH 5 DAYS Performed at [facility] Lab, [address] [place], [place] [address]    REPTSTATUS 05/14/2023 FINAL 05/09/2023 1822       IV ANTIBIOTICS: no Details:   OTHER ANTICOAGULATION:  On Eliquis: no On Coumadin: no   OTHER/APPTS/LAB ORDERS: Let child psychotherapist know he reported he needs assistance with meals     D/C Meds to be reconciled by nurse after every discharge.  Completed By: Manuelita Labella PA-C   Reviewed by: MD:______ RN_______

## 2024-07-07 ENCOUNTER — Telehealth: Payer: Self-pay | Admitting: *Deleted

## 2024-07-07 LAB — HEPATITIS B SURFACE ANTIBODY, QUANTITATIVE: Hep B S AB Quant (Post): 97.2 m[IU]/mL

## 2024-07-07 NOTE — Transitions of Care (Post Inpatient/ED Visit) (Signed)
" ° °  07/07/2024  Name: Corey Park MRN: [id number] DOB: Sep 02, 1970  Today's TOC FU Call Status: Today's TOC FU Call Status:: Unsuccessful Call (1st Attempt) Unsuccessful Call (1st Attempt) Date: 07/07/24  Attempted to reach the patient regarding the most recent Inpatient/ED visit.  Follow Up Plan: Additional outreach attempts will be made to reach the patient to complete the Transitions of Care (Post Inpatient/ED visit) call.   Andrea Dimes RN, BSN [place]  [facility] RN Care Manager [phone]  "

## 2024-07-07 NOTE — Progress Notes (Signed)
 Late Note Entry- Jul 07, 2024  Pt was d/c on Saturday. Contacted [facility] this morning to be advised of pt's d/c date and that pt should resume care today.   Randine Mungo Dialysis Navigator [phone]

## 2024-07-08 ENCOUNTER — Ambulatory Visit: Payer: Self-pay | Admitting: Family

## 2024-07-09 ENCOUNTER — Telehealth: Payer: Self-pay | Admitting: *Deleted

## 2024-07-09 NOTE — Transitions of Care (Post Inpatient/ED Visit) (Signed)
" ° °  07/09/2024  Name: Corey Park MRN: [id number] DOB: [birth year]  Today's TOC FU Call Status: Today's TOC FU Call Status:: Successful TOC FU Call Completed TOC FU Call Complete Date: 07/09/24  Patient's Name and Date of Birth confirmed. Name, DOB  Transition Care Management Follow-up Telephone Call Date of Discharge: 07/05/24 Discharge Facility: [facility]) Type of Discharge: Inpatient Admission Primary Inpatient Discharge Diagnosis:: Chest pain How have you been since you were released from the hospital?: Better Any questions or concerns?: No  Items Reviewed: Did you receive and understand the discharge instructions provided?: Yes Medications obtained,verified, and reconciled?: No Medications Not Reviewed Reasons:: Other: (Patient was working and unable to perform medication review) Dietary orders reviewed?: Yes Type of Diet Ordered:: low sodium heart healthy Do you have support at home?: Yes People in Home [RPT]: parent(s) Name of Support/Comfort Primary Source: Mother and Church Family  Medications Reviewed Today: Medications Reviewed Today   Medications were not reviewed in this encounter     Home Care and Equipment/Supplies: Were Home Health Services Ordered?: No Any new equipment or medical supplies ordered?: No  Functional Questionnaire: Do you need assistance with bathing/showering or dressing?: No Do you need assistance with meal preparation?: No Do you need assistance with eating?: No Do you have difficulty maintaining continence: No Do you need assistance with getting out of bed/getting out of a chair/moving?: No Do you have difficulty managing or taking your medications?: No  Follow up appointments reviewed: PCP Follow-up appointment confirmed?: No (RNCM assisted with scheduling on 07/22/24) MD Provider Line [phone] Given: No Specialist Hospital Follow-up appointment confirmed?: Yes Date of Specialist follow-up appointment?:  08/06/24 Follow-Up Specialty Provider:: Cardiology Do you need transportation to your follow-up appointment?: Yes Transportation Need Intervention Addressed By:: Other: (Patient will arrange with [facility]) Do you understand care options if your condition(s) worsen?: Yes-patient verbalized understanding  SDOH Interventions Today    Flowsheet Row Most Recent Value  SDOH Interventions   Food Insecurity Interventions Intervention Not Indicated  Housing Interventions Intervention Not Indicated  Transportation Interventions Intervention Not Indicated  Utilities Interventions Intervention Not Indicated    Andrea Dimes RN, BSN [place]  [facility] RN Care Manager [phone]  "

## 2024-07-16 ENCOUNTER — Ambulatory Visit: Admitting: Cardiology

## 2024-07-16 ENCOUNTER — Other Ambulatory Visit: Payer: Self-pay

## 2024-07-16 DIAGNOSIS — N186 End stage renal disease: Secondary | ICD-10-CM

## 2024-07-16 NOTE — Addendum Note (Signed)
 Addended by: RAYNA MOATS A on: 07/16/2024 02:32 PM   Modules accepted: Orders

## 2024-07-17 ENCOUNTER — Encounter (HOSPITAL_COMMUNITY): Payer: Self-pay

## 2024-07-22 ENCOUNTER — Other Ambulatory Visit: Payer: Self-pay

## 2024-07-22 ENCOUNTER — Encounter: Payer: Self-pay | Admitting: Family

## 2024-07-22 ENCOUNTER — Ambulatory Visit

## 2024-07-22 ENCOUNTER — Ambulatory Visit (HOSPITAL_COMMUNITY): Admission: RE | Admit: 2024-07-22 | Source: Ambulatory Visit

## 2024-07-22 DIAGNOSIS — N186 End stage renal disease: Secondary | ICD-10-CM

## 2024-07-22 NOTE — Progress Notes (Unsigned)
 " HISTORY AND PHYSICAL     CC:  dialysis access Requesting Provider:  Jaycee Greig PARAS, NP  HPI: This is a 54 y.o. male here for evaluation of his hemodialysis access.   He states ***  Pt has hx of right BVT in 2021 by Dr. Serene.   In June 2024, he did have a right CFA psa and underwent thrombin injection by Dr. Severo with [facility].   Dialysis access history: -right 1st stage BVT 10/16/2019 Dr. Serene -right 2nd stage BVT 01/29/2020 Dr. Serene -fistulogram 03/17/2024 Dr. Magda  The pt is left hand dominant.    Pt is on dialysis.   Days of dialysis if applicable:  ***    HD center:  ***  location.   The pt is not on a statin for cholesterol management.  The pt is not on a daily aspirin .  Other AC:  none The pt *** on *** for hypertension.  The pt is *** on medication for diabetes.   Tobacco hx:  ***  Past Medical History:  Diagnosis Date   Acute blood loss anemia 04/17/2022   Acute encephalopathy 08/11/2023   Acute hypoxic respiratory failure (HCC) 04/02/2023   Adult general medical exam 07/11/2022   Allergy, unspecified, initial encounter 08/25/2019   Asthma    as a child   Cellulitis, perineum 11/26/2019   CHF (congestive heart failure) (HCC) 2021   Complication of vascular dialysis catheter 08/25/2019   COVID-19 virus infection 07/27/2019   Diabetes mellitus without complication (HCC)    type 2   Dilated cardiomyopathy (HCC) 07/03/2018   Elevated troponin    ESRD on hemodialysis (HCC) 06/17/2018   MWF at [facility]   Fe deficiency anemia 12/23/2018   Hypertension    Legally blind    B/L   NSTEMI (non-ST elevated myocardial infarction) (HCC) 06/25/2023   Pneumonia 2021   Sepsis (HCC) 04/17/2022   Type 2 diabetes mellitus with diabetic peripheral angiopathy without gangrene (HCC) 08/25/2019   Unspecified protein-calorie malnutrition 08/25/2019    Past Surgical History:  Procedure Laterality Date   A/V FISTULAGRAM N/A 03/17/2024   Procedure:  A/V Fistulagram;  Surgeon: Magda Debby SAILOR, MD;  Location: [facility];  Service: Cardiovascular;  Laterality: N/A;   BASCILIC VEIN TRANSPOSITION Right 10/16/2019   Procedure: Basilic Vein Transposition Right Arm;  Surgeon: Serene Gaile ORN, MD;  Location: [facility] OR;  Service: Vascular;  Laterality: Right;   BASCILIC VEIN TRANSPOSITION Right 01/29/2020   Procedure: RIGHT ARM SECOND STAGE BASCILIC VEIN TRANSPOSITION;  Surgeon: Serene Gaile ORN, MD;  Location: [facility] OR;  Service: Vascular;  Laterality: Right;   BIOPSY  12/22/2018   Procedure: BIOPSY;  Surgeon: Celestia Agent, MD;  Location: [facility] ENDOSCOPY;  Service: Endoscopy;;   CORONARY ARTERY BYPASS GRAFT N/A 06/28/2023   Procedure: CORONARY ARTERY BYPASS GRAFTING TIMES THREE USING LEFT INTERNAL MAMMARY ARTERY AND RIGHT GREAT SAPHENOUS VEIN HARVESTED ENDOSCOPICALLY;  Surgeon: Shyrl Linnie KIDD, MD;  Location: [facility] OR;  Service: Open Heart Surgery;  Laterality: N/A;   CORONARY ULTRASOUND/IVUS N/A 12/13/2022   Procedure: Coronary Ultrasound/IVUS;  Surgeon: Dann Candyce RAMAN, MD;  Location: [facility] INVASIVE CV LAB;  Service: Cardiovascular;  Laterality: N/A;   ESOPHAGOGASTRODUODENOSCOPY N/A 12/22/2018   Procedure: ESOPHAGOGASTRODUODENOSCOPY (EGD);  Surgeon: Celestia Agent, MD;  Location: [facility] ENDOSCOPY;  Service: Endoscopy;  Laterality: N/A;   IR FLUORO GUIDE CV LINE RIGHT  07/29/2019   IR US  GUIDE VASC ACCESS RIGHT  07/29/2019   LEFT HEART CATH AND CORONARY ANGIOGRAPHY N/A 12/13/2022  Procedure: LEFT HEART CATH AND CORONARY ANGIOGRAPHY;  Surgeon: Dann Candyce RAMAN, MD;  Location: [facility] INVASIVE CV LAB;  Service: Cardiovascular;  Laterality: N/A;   LEFT HEART CATH AND CORONARY ANGIOGRAPHY N/A 06/25/2023   Procedure: LEFT HEART CATH AND CORONARY ANGIOGRAPHY;  Surgeon: Verlin Lonni BIRCH, MD;  Location: [facility] INVASIVE CV LAB;  Service: Cardiovascular;  Laterality: N/A;   TEE WITHOUT CARDIOVERSION N/A 06/28/2023   Procedure: TRANSESOPHAGEAL ECHOCARDIOGRAM  (TEE);  Surgeon: Shyrl Linnie KIDD, MD;  Location: [facility] OR;  Service: Open Heart Surgery;  Laterality: N/A;   TRANSESOPHAGEAL ECHOCARDIOGRAM (CATH LAB) N/A 05/10/2023   Procedure: TRANSESOPHAGEAL ECHOCARDIOGRAM;  Surgeon: Barbaraann Darryle Ned, MD;  Location: [facility] INVASIVE CV LAB;  Service: Cardiovascular;  Laterality: N/A;    Allergies[1]  Current Outpatient Medications  Medication Sig Dispense Refill   atorvastatin  (LIPITOR ) 80 MG tablet Take 1 tablet (80 mg total) by mouth daily. 30 tablet 3   atropine  1 % ophthalmic solution Place 1 drop into the left eye daily. 2 mL 12   calcium  acetate (PHOSLO ) 667 MG capsule Take 3 capsules (2,001 mg total) by mouth with breakfast, with lunch, and with evening meal. 270 capsule 1   carvedilol  (COREG ) 25 MG tablet Take 25 mg by mouth every morning.     Cholecalciferol  (VITAMIN D3) 50 MCG (2000 UT) capsule Take 2,000 Units by mouth daily.     diphenhydrAMINE  (BENADRYL ) 25 MG tablet Take 25 mg by mouth every 6 (six) hours as needed for itching.     docusate sodium  (COLACE) 100 MG capsule Take 1 capsule (100 mg total) by mouth 2 (two) times daily. 60 capsule 0   dorzolamide -timolol  (COSOPT ) 2-0.5 % ophthalmic solution Place 1 drop into the left eye 2 (two) times daily. 10 mL 12   erythromycin  ophthalmic ointment Place a 1/2 inch ribbon of ointment into the lower eyelid. (Patient not taking: Reported on 07/03/2024) 9 each 0   famotidine  (PEPCID ) 20 MG tablet Take 20 mg by mouth daily.     ferric citrate  (AURYXIA ) 1 GM 210 MG(Fe) tablet Take 2 tablets (420 mg total) by mouth daily. (Patient taking differently: Take 420 mg by mouth 3 (three) times daily with meals.) 60 tablet 0   irbesartan  (AVAPRO ) 75 MG tablet Take 1 tablet (75 mg total) by mouth daily. (Patient not taking: Reported on 07/03/2024) 30 tablet 0   lactulose  (CHRONULAC ) 10 GM/15ML solution Take 15 mLs (10 g total) by mouth 2 (two) times daily as needed for mild constipation or moderate constipation.  473 mL 0   latanoprost  (XALATAN ) 0.005 % ophthalmic solution Place 1 drop into the left eye daily. 2.5 mL 12   multivitamin (RENA-VIT) TABS tablet Take 1 tablet by mouth daily.     pantoprazole  (PROTONIX ) 20 MG tablet Take 20 mg by mouth daily as needed for heartburn.     Vitamin D , Ergocalciferol , (DRISDOL ) 1.25 MG (50000 UNIT) CAPS capsule Take 1 capsule (50,000 Units total) by mouth every 7 (seven) days. (Patient taking differently: Take 50,000 Units by mouth every Monday.) 5 capsule 0   No current facility-administered medications for this visit.    Family History  Problem Relation Age of Onset   CAD Mother    Hypertension Mother    Diabetes Neg Hx    Stroke Neg Hx    Cancer Neg Hx    Kidney failure Neg Hx    Stomach cancer Neg Hx    Colon cancer Neg Hx    Rectal cancer Neg Hx  Social History   Socioeconomic History   Marital status: Single    Spouse name: Not on file   Number of children: Not on file   Years of education: Not on file   Highest education level: Not on file  Occupational History   Not on file  Tobacco Use   Smoking status: Never   Smokeless tobacco: Never  Vaping Use   Vaping status: Never Used  Substance and Sexual Activity   Alcohol use: Not Currently   Drug use: Not Currently    Types: Marijuana   Sexual activity: Not on file  Other Topics Concern   Not on file  Social History Narrative   Not on file   Social Drivers of Health   Tobacco Use: Low Risk (07/04/2024)   Patient History    Smoking Tobacco Use: Never    Smokeless Tobacco Use: Never    Passive Exposure: Not on file  Financial Resource Strain: Low Risk (11/01/2023)   Overall Financial Resource Strain (CARDIA)    Difficulty of Paying Living Expenses: Not very hard  Recent Concern: Physicist, Medical Strain - High Risk (08/23/2023)   Overall Financial Resource Strain (CARDIA)    Difficulty of Paying Living Expenses: Hard  Food Insecurity: No Food Insecurity (07/09/2024)   Epic     Worried About Programme Researcher, Broadcasting/film/video in the Last Year: Never true    Ran Out of Food in the Last Year: Never true  Transportation Needs: No Transportation Needs (07/09/2024)   Epic    Lack of Transportation (Medical): No    Lack of Transportation (Non-Medical): No  Physical Activity: Insufficiently Active (11/01/2023)   Exercise Vital Sign    Days of Exercise per Week: 7 days    Minutes of Exercise per Session: 20 min  Stress: No Stress Concern Present (11/01/2023)   [employer] of Occupational Health - Occupational Stress Questionnaire    Feeling of Stress : Not at all  Social Connections: Moderately Integrated (07/04/2024)   Social Connection and Isolation Panel    Frequency of Communication with Friends and Family: More than three times a week    Frequency of Social Gatherings with Friends and Family: More than three times a week    Attends Religious Services: More than 4 times per year    Active Member of Clubs or Organizations: Yes    Attends Banker Meetings: More than 4 times per year    Marital Status: Never married  Intimate Partner Violence: Not At Risk (07/09/2024)   Epic    Fear of Current or Ex-Partner: No    Emotionally Abused: No    Physically Abused: No    Sexually Abused: No  Depression (PHQ2-9): Low Risk (07/09/2024)   Depression (PHQ2-9)    PHQ-2 Score: 0  Alcohol Screen: Low Risk (11/01/2023)   Alcohol Screen    Last Alcohol Screening Score (AUDIT): 0  Housing: Unknown (07/09/2024)   Epic    Unable to Pay for Housing in the Last Year: No    Number of Times Moved in the Last Year: Not on file    Homeless in the Last Year: No  Utilities: Not At Risk (07/09/2024)   Epic    Threatened with loss of utilities: No  Health Literacy: Inadequate Health Literacy (08/23/2023)   B1300 Health Literacy    Frequency of need for help with medical instructions: Sometimes     ROS: [x]  Positive   [ ]  Negative   [ ]  All sytems reviewed  and are  negative *** Cardiac: []  chest pain/pressure []  SOB/DOE  Vascular: []  pain in legs while walking []  pain in feet when lying flat []  swelling in legs  Pulmonary: []  asthma []  wheezing  Neurologic: []  hx CVA/TIA  Hematologic: []  bleeding problems  GI []  GERD  GU: [x]  CKD/renal failure  [x]  HD---[]  M/W/F []  T/T/S  Psychiatric: []  hx of major depression  Integumentary: []  rashes []  ulcers  Constitutional: []  fever []  chills   PHYSICAL EXAMINATION:  ***   General:  WDWN male in NAD Gait: Not observed HENT: WNL Pulmonary: normal non-labored breathing  Cardiac: {Desc; regular/irreg:14544}, {With/Without:20273} carotid bruit*** Abdomen: soft, NT Skin: {With/Without:20273} rashes Vascular Exam/Pulses:   Right Left  Radial {Exam; arterial pulse strength 0-4:30167} {Exam; arterial pulse strength 0-4:30167}  Ulnar {Exam; arterial pulse strength 0-4:30167} {Exam; arterial pulse strength 0-4:30167}   Extremities:  *** Musculoskeletal: no muscle wasting or atrophy  Neurologic: A&O X 3  Non-Invasive Vascular Imaging:   Dialysis duplex on 07/22/2024: ***   ASSESSMENT/PLAN: 54 y.o. male with *** here for evaluation *** hemodialysis access with hx of ***   -*** -pt *** on dialysis on *** -discussed with pt that access does not last forever and will need intervention or even new access at some point.  -pt is *** hand dominant - will plan for *** -pt *** on anticoagulation   Darnetta Kesselman, [facility] [phone]  Clinic MD:   Gretta    [1]  Allergies Allergen Reactions   Gabapentin  Other (See Comments)    Hallucinations    Oxycodone  Other (See Comments)    Hallucinations    Imdur  [Isosorbide  Nitrate] Other (See Comments)    Endorsed headache in the past - willing to retry   "

## 2024-07-22 NOTE — Progress Notes (Signed)
 Erroneous encounter-disregard

## 2024-07-31 ENCOUNTER — Encounter: Admitting: Family

## 2024-07-31 NOTE — Progress Notes (Signed)
 Erroneous encounter-disregard

## 2024-08-06 ENCOUNTER — Inpatient Hospital Stay: Admitting: Family

## 2024-08-06 ENCOUNTER — Ambulatory Visit: Admitting: Cardiology

## 2024-08-12 ENCOUNTER — Encounter (HOSPITAL_BASED_OUTPATIENT_CLINIC_OR_DEPARTMENT_OTHER): Admitting: General Surgery

## 2024-08-14 ENCOUNTER — Ambulatory Visit

## 2024-08-14 ENCOUNTER — Ambulatory Visit (HOSPITAL_COMMUNITY)

## 2024-11-13 ENCOUNTER — Ambulatory Visit
